# Patient Record
Sex: Female | Born: 1935
Health system: Southern US, Community
[De-identification: ages and names within clinical notes are randomized; demographics above are authoritative.]

## PROBLEM LIST (undated history)

## (undated) DIAGNOSIS — M1990 Unspecified osteoarthritis, unspecified site: Secondary | ICD-10-CM

## (undated) DIAGNOSIS — I2119 ST elevation (STEMI) myocardial infarction involving other coronary artery of inferior wall: Secondary | ICD-10-CM

## (undated) DIAGNOSIS — I2510 Atherosclerotic heart disease of native coronary artery without angina pectoris: Secondary | ICD-10-CM

## (undated) DIAGNOSIS — I428 Other cardiomyopathies: Secondary | ICD-10-CM

## (undated) DIAGNOSIS — E1122 Type 2 diabetes mellitus with diabetic chronic kidney disease: Secondary | ICD-10-CM

## (undated) DIAGNOSIS — E559 Vitamin D deficiency, unspecified: Secondary | ICD-10-CM

## (undated) DIAGNOSIS — I340 Nonrheumatic mitral (valve) insufficiency: Secondary | ICD-10-CM

## (undated) DIAGNOSIS — Z7901 Long term (current) use of anticoagulants: Secondary | ICD-10-CM

## (undated) DIAGNOSIS — E782 Mixed hyperlipidemia: Secondary | ICD-10-CM

## (undated) DIAGNOSIS — I872 Venous insufficiency (chronic) (peripheral): Secondary | ICD-10-CM

## (undated) DIAGNOSIS — I10 Essential (primary) hypertension: Secondary | ICD-10-CM

## (undated) DIAGNOSIS — Z972 Presence of dental prosthetic device (complete) (partial): Secondary | ICD-10-CM

## (undated) DIAGNOSIS — I482 Chronic atrial fibrillation, unspecified: Secondary | ICD-10-CM

## (undated) DIAGNOSIS — E669 Obesity, unspecified: Secondary | ICD-10-CM

## (undated) DIAGNOSIS — I5042 Chronic combined systolic (congestive) and diastolic (congestive) heart failure: Secondary | ICD-10-CM

## (undated) DIAGNOSIS — M48061 Spinal stenosis, lumbar region without neurogenic claudication: Secondary | ICD-10-CM

## (undated) DIAGNOSIS — E119 Type 2 diabetes mellitus without complications: Secondary | ICD-10-CM

## (undated) DIAGNOSIS — Z9289 Personal history of other medical treatment: Secondary | ICD-10-CM

## (undated) DIAGNOSIS — G8929 Other chronic pain: Secondary | ICD-10-CM

## (undated) DIAGNOSIS — E079 Disorder of thyroid, unspecified: Secondary | ICD-10-CM

## (undated) DIAGNOSIS — Z794 Long term (current) use of insulin: Secondary | ICD-10-CM

## (undated) HISTORY — DX: Venous insufficiency (chronic) (peripheral): I87.2

## (undated) HISTORY — DX: Obesity, unspecified: E66.9

## (undated) HISTORY — DX: Disorder of thyroid, unspecified: E07.9

## (undated) HISTORY — DX: Essential (primary) hypertension: I10

## (undated) HISTORY — DX: Atherosclerotic heart disease of native coronary artery without angina pectoris: I25.10

## (undated) HISTORY — DX: Vitamin D deficiency, unspecified: E55.9

## (undated) HISTORY — DX: Chronic combined systolic (congestive) and diastolic (congestive) heart failure: I50.42

## (undated) HISTORY — DX: ST elevation (STEMI) myocardial infarction involving other coronary artery of inferior wall: I21.19

## (undated) HISTORY — DX: Chronic atrial fibrillation, unspecified: I48.20

## (undated) HISTORY — DX: Other cardiomyopathies: I42.8

## (undated) HISTORY — DX: Long term (current) use of anticoagulants: Z79.01

## (undated) HISTORY — DX: Unspecified osteoarthritis, unspecified site: M19.90

## (undated) HISTORY — DX: Type 2 diabetes mellitus without complications: E11.9

## (undated) HISTORY — DX: Mixed hyperlipidemia: E78.2

---

## 1966-01-30 HISTORY — PX: TUBAL LIGATION: SHX77

## 1994-01-30 HISTORY — PX: VESICOVAGINAL FISTULA CLOSURE W/ TAH: SUR271

## 1997-01-30 HISTORY — PX: CHOLECYSTECTOMY: SHX55

## 2005-05-04 ENCOUNTER — Ambulatory Visit: Payer: Self-pay | Admitting: Family Medicine

## 2005-05-11 ENCOUNTER — Ambulatory Visit: Payer: Self-pay | Admitting: Family Medicine

## 2005-11-06 ENCOUNTER — Emergency Department: Payer: Self-pay | Admitting: Emergency Medicine

## 2005-11-06 ENCOUNTER — Other Ambulatory Visit: Payer: Self-pay

## 2005-12-14 ENCOUNTER — Ambulatory Visit: Payer: Self-pay | Admitting: Cardiology

## 2006-01-10 ENCOUNTER — Ambulatory Visit: Payer: Self-pay | Admitting: Cardiology

## 2006-02-02 ENCOUNTER — Ambulatory Visit: Payer: Self-pay | Admitting: Cardiology

## 2006-05-02 ENCOUNTER — Ambulatory Visit: Payer: Self-pay | Admitting: Cardiology

## 2006-05-22 ENCOUNTER — Ambulatory Visit: Payer: Self-pay | Admitting: Family Medicine

## 2006-11-08 ENCOUNTER — Ambulatory Visit: Payer: Self-pay | Admitting: Cardiology

## 2006-11-22 ENCOUNTER — Ambulatory Visit: Payer: Self-pay | Admitting: Cardiology

## 2006-11-22 LAB — CONVERTED CEMR LAB
BUN: 17 mg/dL (ref 6–23)
Calcium: 9.4 mg/dL (ref 8.4–10.5)
Creatinine, Ser: 0.78 mg/dL (ref 0.40–1.20)
Glucose, Bld: 104 mg/dL — ABNORMAL HIGH (ref 70–99)

## 2006-11-27 ENCOUNTER — Encounter: Payer: Self-pay | Admitting: Cardiology

## 2006-11-27 ENCOUNTER — Ambulatory Visit: Payer: Self-pay | Admitting: Cardiology

## 2006-12-07 ENCOUNTER — Ambulatory Visit: Payer: Self-pay | Admitting: Cardiology

## 2007-05-17 ENCOUNTER — Ambulatory Visit: Payer: Self-pay | Admitting: Cardiology

## 2007-11-01 ENCOUNTER — Ambulatory Visit: Payer: Self-pay | Admitting: Cardiovascular Disease

## 2007-11-08 ENCOUNTER — Ambulatory Visit: Payer: Self-pay | Admitting: Cardiovascular Disease

## 2007-11-08 ENCOUNTER — Ambulatory Visit: Payer: Self-pay

## 2007-11-26 ENCOUNTER — Ambulatory Visit: Payer: Self-pay | Admitting: Cardiovascular Disease

## 2007-11-26 LAB — CONVERTED CEMR LAB
T4, Total: 8.2 ug/dL (ref 5.0–12.5)
TSH: 4.624 microintl units/mL — ABNORMAL HIGH (ref 0.350–4.50)

## 2008-01-28 ENCOUNTER — Ambulatory Visit: Payer: Self-pay | Admitting: Cardiovascular Disease

## 2008-03-05 ENCOUNTER — Ambulatory Visit: Payer: Self-pay | Admitting: Rheumatology

## 2008-03-24 ENCOUNTER — Ambulatory Visit: Payer: Self-pay | Admitting: Family Medicine

## 2008-06-25 ENCOUNTER — Encounter: Payer: Self-pay | Admitting: Cardiovascular Disease

## 2008-06-25 ENCOUNTER — Encounter (INDEPENDENT_AMBULATORY_CARE_PROVIDER_SITE_OTHER): Payer: Self-pay | Admitting: *Deleted

## 2008-06-25 LAB — CONVERTED CEMR LAB
INR: 2.5
Prothrombin Time: 24.6 s

## 2008-07-16 ENCOUNTER — Encounter (INDEPENDENT_AMBULATORY_CARE_PROVIDER_SITE_OTHER): Payer: Self-pay | Admitting: *Deleted

## 2008-07-16 ENCOUNTER — Encounter: Payer: Self-pay | Admitting: Cardiovascular Disease

## 2008-07-16 LAB — CONVERTED CEMR LAB
INR: 2.7
Prothrombin Time: 26.7 s

## 2008-07-20 ENCOUNTER — Telehealth: Payer: Self-pay | Admitting: Cardiovascular Disease

## 2008-09-08 ENCOUNTER — Encounter: Payer: Self-pay | Admitting: Cardiovascular Disease

## 2008-09-08 ENCOUNTER — Encounter (INDEPENDENT_AMBULATORY_CARE_PROVIDER_SITE_OTHER): Payer: Self-pay | Admitting: *Deleted

## 2008-09-10 ENCOUNTER — Encounter (INDEPENDENT_AMBULATORY_CARE_PROVIDER_SITE_OTHER): Payer: Self-pay | Admitting: *Deleted

## 2008-09-16 ENCOUNTER — Telehealth: Payer: Self-pay | Admitting: Cardiovascular Disease

## 2008-09-16 ENCOUNTER — Ambulatory Visit: Payer: Self-pay | Admitting: Cardiovascular Disease

## 2008-09-16 DIAGNOSIS — I2510 Atherosclerotic heart disease of native coronary artery without angina pectoris: Secondary | ICD-10-CM

## 2008-09-16 DIAGNOSIS — E785 Hyperlipidemia, unspecified: Secondary | ICD-10-CM | POA: Insufficient documentation

## 2008-09-16 DIAGNOSIS — I4891 Unspecified atrial fibrillation: Secondary | ICD-10-CM | POA: Insufficient documentation

## 2009-01-04 ENCOUNTER — Telehealth: Payer: Self-pay | Admitting: Cardiology

## 2009-01-27 ENCOUNTER — Telehealth: Payer: Self-pay | Admitting: Cardiovascular Disease

## 2009-02-08 ENCOUNTER — Ambulatory Visit: Payer: Self-pay | Admitting: Cardiovascular Disease

## 2009-02-08 DIAGNOSIS — I5032 Chronic diastolic (congestive) heart failure: Secondary | ICD-10-CM | POA: Insufficient documentation

## 2009-02-11 ENCOUNTER — Ambulatory Visit: Payer: Self-pay

## 2009-02-11 ENCOUNTER — Encounter: Payer: Self-pay | Admitting: Cardiovascular Disease

## 2009-03-16 ENCOUNTER — Encounter: Payer: Self-pay | Admitting: Cardiovascular Disease

## 2009-08-03 ENCOUNTER — Telehealth: Payer: Self-pay | Admitting: Cardiovascular Disease

## 2009-08-05 ENCOUNTER — Ambulatory Visit: Payer: Self-pay | Admitting: Cardiovascular Disease

## 2009-10-01 ENCOUNTER — Telehealth: Payer: Self-pay | Admitting: Cardiovascular Disease

## 2009-11-23 ENCOUNTER — Encounter: Payer: Self-pay | Admitting: Cardiovascular Disease

## 2009-12-21 ENCOUNTER — Ambulatory Visit: Payer: Self-pay | Admitting: Family Medicine

## 2009-12-21 ENCOUNTER — Encounter: Payer: Self-pay | Admitting: Cardiovascular Disease

## 2009-12-21 ENCOUNTER — Encounter: Payer: Self-pay | Admitting: Internal Medicine

## 2009-12-31 ENCOUNTER — Ambulatory Visit: Payer: Self-pay | Admitting: Internal Medicine

## 2010-01-11 ENCOUNTER — Encounter: Payer: Self-pay | Admitting: Internal Medicine

## 2010-01-11 ENCOUNTER — Ambulatory Visit: Payer: Self-pay

## 2010-02-01 ENCOUNTER — Ambulatory Visit
Admission: RE | Admit: 2010-02-01 | Discharge: 2010-02-01 | Payer: Self-pay | Source: Home / Self Care | Attending: Cardiovascular Disease | Admitting: Cardiovascular Disease

## 2010-02-01 ENCOUNTER — Encounter: Payer: Self-pay | Admitting: Cardiovascular Disease

## 2010-02-01 DIAGNOSIS — R0602 Shortness of breath: Secondary | ICD-10-CM

## 2010-03-02 NOTE — Assessment & Plan Note (Signed)
Summary: F6M/AMD  Medications Added COUMADIN 3 MG TABS (WARFARIN SODIUM) take as directed MACROBID 100 MG CAPS (NITROFURANTOIN MONOHYD MACRO) 1 once daily      Allergies Added:   Visit Type:  Follow-up Primary Provider:  Julieanne Manson MD  CC:  c/o swelling in legs and ankles, shortness of breath and dizziness. She had a spell of chest pressure this past Tuesday with jaw pain, and took a NTG tablet and pain was better after two NTG tablet.Marland Kitchen  History of Present Illness: Tracy Mann is a pleasant 77 year old Caucasian female with a past medical history significant for chronic atrial fibrillation on Coumadin therapy, diastolic dysfunction, hypertension, hyperlipidemia, obesity, and history of an acute inferior wall myocardial infarction that was felt to be secondary to a thrombus from atrial fib.  in October 2007 at [facility], who is here today for routine cardiac follow up.  She has been rate controlled with beta blocker therapy only and has not wanted to consider cardioversion or anti-arrythmic therapy.   stress perfusion study in 2009 which showed a fixed mid apical inferior defect and a small area of anterior wall reversibility that was felt to be likely secondary to breast attenuation artifact.   48-hour Holter monitor, which showed that she was in persistent atrial fibrillation with one episode of rapid ventricular response.   On previous visits, she has complained of edema, flushing in her face, neck as well as fatigue. Today she reports that she had a severe episode of right face and neck pain that came on acutely while she was driving 2 days ago. She had to pull over, she was very anxious and felt that she did not have any help in case she passed out. She took a Nitroglycerin and her pain resolved. She has been very anxious about her symptoms of right face and neck pain given that her old heart attack presented with pain on the left side of her face. In the past 2  days, she had no further episodes, as been ambulating without difficulty though she reports having other symptoms such as arm numbness, feeling weak.     Current Medications (verified): 1)  Zetia 10 Mg Tabs (Ezetimibe) .... Take One Tablet By Mouth Daily. 2)  Metoprolol Succinate 25 Mg Xr24h-Tab (Metoprolol Succinate) .... Take One Tablet By Mouth Two Times A Day 3)  Lisinopril 2.5 Mg Tabs (Lisinopril) .... Take One Tablet By Mouth Daily 4)  Coumadin 3 Mg Tabs (Warfarin Sodium) .... Take As Directed 5)  Levothroid 75 Mcg Tabs (Levothyroxine Sodium) .Marland Kitchen.. 1 By Mouth Once Daily 6)  Zolpidem Tartrate 10 Mg Tabs (Zolpidem Tartrate) .Marland Kitchen.. 1 By Mouth Once Daily 7)  Hydrocodone-Acetaminophen 10-325 Mg Tabs (Hydrocodone-Acetaminophen) .... As Needed 8)  Lorazepam 1 Mg Tabs (Lorazepam) .... Two Times A Day As Needed 9)  Potassium Chloride Cr 10 Meq Cr-Caps (Potassium Chloride) .... 1/2  Tablet By Mouth Daily 10)  Chromium Picolinate 500 Mcg Tabs (Chromium Picolinate) .Marland Kitchen.. 1 -2 Daily 11)  Magnesium 500 Mg Tabs (Magnesium) .Marland Kitchen.. 1 By Mouth Once Daily 12)  Nitrostat 0.4 Mg Subl (Nitroglycerin) .Marland Kitchen.. 1 Tablet Under Tongue At Onset of Chest Pain; You May Repeat Every 5 Minutes For Up To 3 Doses. 13)  Furosemide 20 Mg Tabs (Furosemide) .... Take One Tablet By Mouth Daily As Directed 14)  Macrobid 100 Mg Caps (Nitrofurantoin Monohyd Macro) .Marland Kitchen.. 1 Once Daily  Allergies (verified): 1)  ! Sulfa  Past History:  Past Medical History: Last updated: 09/12/2008  1. Chronic atrial fibrillation.   2. Chronic diastolic dysfunction.   3. Anticoagulation.   4. History of an acute inferior wall infarct secondary to a coronary       embolus probably secondary to her atrial fibrillation.  She had       nonobstructive disease otherwise.   5. Hypertension.   6. Obesity.   7. Mixed hyperlipidemia.   Past Surgical History: Last updated: 05-Oct-2008  Hysterectomy in 1996,  tubal ligation in 1968,  cholecystectomy in  1999.  Family History: Last updated: 2008-10-05  Mother died of probable heart problems at age 64  Social History: Last updated: 02/08/2009 She is widowed. She has children and grandchildren.  She lives in [place]. No tobacco alcohol or drugs  Review of Systems       The patient complains of peripheral edema.  The patient denies fever, weight loss, weight gain, vision loss, decreased hearing, hoarseness, chest pain, syncope, dyspnea on exertion, prolonged cough, abdominal pain, incontinence, muscle weakness, depression, and enlarged lymph nodes.         Right neck and face/head pain  Vital Signs:  Patient profile:   77 year old female Height:      65.5 inches Weight:      232 pounds BMI:     38.16 Pulse rate:   94 / minute BP sitting:   156 / 78  (right arm) Cuff size:   large  Vitals Entered By: Bishop Dublin, CMA (August 05, 2009 3:57 PM)  Physical Exam  General:  Well developed, well nourished, in no acute distress.Obese Head:  normocephalic and atraumatic Neck:  Neck supple, no JVD. No masses, thyromegaly or abnormal cervical nodes. Chest Wall:  no deformities or breast masses noted Lungs:  Clear bilaterally to auscultation and percussion. Heart:  Non-displaced PMI, chest non-tender; regular rate and rhythm, S1, S2 without murmurs, rubs or gallops. Carotid upstroke normal, no bruit. . Pedals normal pulses. trace edema around her ankles, no varicosities. Abdomen:  Bowel sounds positive; abdomen soft and non-tender without masses Msk:  Back normal, normal gait. Muscle strength and tone normal. Pulses:  pulses normal in all 4 extremities Extremities:  No clubbing or cyanosis. Neurologic:  Alert and oriented x 3. Skin:  Intact without lesions or rashes. Psych:  Normal affect.   Impression & Recommendations:  Problem # 1:  ATRIAL FIBRILLATION (ICD-427.31) heart rate appears to be relatively well controlled on her current dose of metoprolol. She has no symptoms of  tachycardia palpitations, shortness of breath. We have made no changes.  Her updated medication list for this problem includes:    Metoprolol Succinate 25 Mg Xr24h-tab (Metoprolol succinate) .Marland Kitchen... Take one tablet by mouth two times a day    Coumadin 3 Mg Tabs (Warfarin sodium) .Marland Kitchen... Take as directed  Orders: EKG w/ Interpretation (93000)  Problem # 2:  HYPERTENSION, HEART CONTROLLED W/O ASSOC CHF (ICD-402.10) Her blood pressure is borderline elevated today though she states that it is typically well controlled at home. We have asked her to keep an eye on her pressure and to let us know if she continues to have systolics greater than 140.  Her updated medication list for this problem includes:    Metoprolol Succinate 25 Mg Xr24h-tab (Metoprolol succinate) .Marland Kitchen... Take one tablet by mouth two times a day    Lisinopril 2.5 Mg Tabs (Lisinopril) .Marland Kitchen... Take one tablet by mouth daily    Furosemide 20 Mg Tabs (Furosemide) .Marland Kitchen... Take one tablet by mouth daily as  directed  Problem # 3:  HYPERLIPIDEMIA-MIXED (ICD-272.4) She has been unable to tolerate statins. She has no severe coronary artery disease or peripheral vascular disease. She has been able to tolerate zetia.   Her updated medication list for this problem includes:    Zetia 10 Mg Tabs (Ezetimibe) .Marland Kitchen... Take one tablet by mouth daily.  Problem # 4:  CAD, NATIVE VESSEL (ICD-414.01) History of suspected thrombus secondary to atrial fibrillation with catheterization at [facility].  She had a recent episode of right neck and face/head pain that seemed to be resolved with nitroglycerin. It is very atypical for cardiac. I would be more concerned about cervical disc disease, esophageal spasm, neck musculoskeletal/ligamental spasm. I've asked her to contact myself or Dr. Sullivan Lone if she has recurrent episodes.  Her updated medication list for this problem includes:    Metoprolol Succinate 25 Mg Xr24h-tab (Metoprolol succinate) .Marland Kitchen... Take one tablet by  mouth two times a day    Lisinopril 2.5 Mg Tabs (Lisinopril) .Marland Kitchen... Take one tablet by mouth daily    Coumadin 3 Mg Tabs (Warfarin sodium) .Marland Kitchen... Take as directed    Nitrostat 0.4 Mg Subl (Nitroglycerin) .Marland Kitchen... 1 tablet under tongue at onset of chest pain; you may repeat every 5 minutes for up to 3 doses.  Orders: EKG w/ Interpretation (93000)

## 2010-03-02 NOTE — Assessment & Plan Note (Signed)
Summary: rov  Medications Added POTASSIUM CHLORIDE CR 10 MEQ CR-CAPS (POTASSIUM CHLORIDE) 1/2  tablet by mouth daily CHROMIUM PICOLINATE 500 MCG TABS (CHROMIUM PICOLINATE) 1 -2 daily MAGNESIUM 500 MG TABS (MAGNESIUM) 1 by mouth once daily NITROSTAT 0.4 MG SUBL (NITROGLYCERIN) 1 tablet under tongue at onset of chest pain; you may repeat every 5 minutes for up to 3 doses.      Allergies Added:   Visit Type:  Follow-up Primary Provider:  Julieanne Manson MD  CC:  little swelling in legs, some sob, and chest pain when retaining fluid.  History of Present Illness: [name] is a pleasant 77 year old Caucasian female with a past medical history significant for chronic persistent atrial fibrillation on Coumadin therapy, chronic diastolic dysfunction, hypertension, hyperlipidemia, obesity, and history of an acute inferior wall myocardial infarction that was felt to be secondary to a thrombus in October 2007 at [facility], who is here today for routine cardiac follow up.  The patient was  evaluated with a stress perfusion study in 2009 which showed a fixed mid apical inferior defect and a small area of anterior wall reversibility that was felt to be likely secondary to breast attenuation artifact.  I also had her wear a  48-hour Holter monitor, which showed that she was in persistent atrial fibrillation with one episode of rapid ventricular response.  Ms. Talamante main complaints in the past have been that of chest pain as well as some lower extremity edema, flushing in her face and neck and an overall feeling of fatigue. She has been rate controlled with beta blocker therapy only and has not wanted to consider cardioversion or anti-arrythmic therapy.    She returns today for routine follow up. She has been doing well. Her overall energy level is decreased but stable.  She did not watch her diet around the holidays and reports fluid build up. She felt shortness of breath and had some  mild chest pains. She took Lasix for two days and has felt better over the last week. She has had no exertional chest pain. She denies any dizziness, near syncope or syncope. She does not take an ASA because of GI irritation. Her coumadin is followed in Dr. Wonda Olds office and her INR has been therapeutic per the patient.   Current Medications (verified): 1)  Zetia 10 Mg Tabs (Ezetimibe) .... Take One Tablet By Mouth Daily. 2)  Metoprolol Succinate 25 Mg Xr24h-Tab (Metoprolol Succinate) .... Take One Tablet By Mouth Two Times A Day 3)  Lisinopril 2.5 Mg Tabs (Lisinopril) .... Take One Tablet By Mouth Daily 4)  Warfarin Sodium 4 Mg Tabs (Warfarin Sodium) .... Use As Directed By Anticoagulation Clinic 5)  Levothroid 75 Mcg Tabs (Levothyroxine Sodium) .Marland Kitchen.. 1 By Mouth Once Daily 6)  Zolpidem Tartrate 10 Mg Tabs (Zolpidem Tartrate) .Marland Kitchen.. 1 By Mouth Once Daily 7)  Hydrocodone-Acetaminophen 10-325 Mg Tabs (Hydrocodone-Acetaminophen) .... As Needed 8)  Lorazepam 1 Mg Tabs (Lorazepam) .... Two Times A Day As Needed 9)  Daily Multi 50+  Tabs (Multiple Vitamins-Minerals) .Marland Kitchen.. 1 By Mouth Once Daily 10)  Pantoprazole Sodium 40 Mg Tbec (Pantoprazole Sodium) .Marland Kitchen.. 1 By Mouth Once Daily 11)  Potassium Chloride Cr 10 Meq Cr-Caps (Potassium Chloride) .... 1/2  Tablet By Mouth Daily 12)  Chromium Picolinate 500 Mcg Tabs (Chromium Picolinate) .Marland Kitchen.. 1 -2 Daily 13)  Magnesium 500 Mg Tabs (Magnesium) .Marland Kitchen.. 1 By Mouth Once Daily  Allergies (verified): 1)  ! Sulfa  Past History:  Past Medical History:  Reviewed history from 09/12/2008 and no changes required.  1. Chronic atrial fibrillation.   2. Chronic diastolic dysfunction.   3. Anticoagulation.   4. History of an acute inferior wall infarct secondary to a coronary       embolus probably secondary to her atrial fibrillation.  She had       nonobstructive disease otherwise.   5. Hypertension.   6. Obesity.   7. Mixed hyperlipidemia.   Social History: Reviewed  history from 09/12/2008 and no changes required. She is widowed. She has children and grandchildren.  She lives in [place]. No tobacco alcohol or drugs  Review of Systems       The patient complains of fatigue, chest pain, shortness of breath, and leg swelling.  The patient denies malaise, fever, weight gain/loss, vision loss, decreased hearing, hoarseness, palpitations, prolonged cough, wheezing, sleep apnea, coughing up blood, abdominal pain, blood in stool, nausea, vomiting, diarrhea, heartburn, incontinence, blood in urine, muscle weakness, joint pain, rash, skin lesions, headache, fainting, dizziness, depression, anxiety, enlarged lymph nodes, easy bruising or bleeding, and environmental allergies.    Vital Signs:  Patient profile:   77 year old female Height:      65.5 inches Weight:      223 pounds BMI:     36.68 Pulse rate:   74 / minute Pulse rhythm:   regular BP sitting:   140 / 64  (left arm) Cuff size:   large  Vitals Entered By: Mercer Pod (February 08, 2009 3:06 PM)  Physical Exam  General:  General: Well developed, well nourished, NAD Psychiatric: Mood and affect normal Neck: No JVD, no carotid bruits, no thyromegaly, no lymphadenopathy. Lungs:Clear bilaterally, no wheezes, rhonci, crackles UE:[id number]. No murmurs, gallops rubs Abdomen: soft, NT, ND, BS present Extremities: No edema, pulses 2+.    Impression & Recommendations:  Problem # 1:  ATRIAL FIBRILLATION (ICD-427.31) Chronic persistent atrial fibrillation on coumadin therapy. She has been rate controlled. She has no palpitations. She does not wish to have a cardioversion since she is doing well with rate control. Continue anticoagulation with coumadin. Continue current dose of beta blocker.   Her updated medication list for this problem includes:    Metoprolol Succinate 25 Mg Xr24h-tab (Metoprolol succinate) .Marland Kitchen... Take one tablet by mouth two times a day    Warfarin Sodium 4 Mg Tabs (Warfarin  sodium) ..... Use as directed by anticoagulation clinic  Problem # 2:  CAD, NATIVE VESSEL (ICD-414.01) Stable.   Her updated medication list for this problem includes:    Metoprolol Succinate 25 Mg Xr24h-tab (Metoprolol succinate) .Marland Kitchen... Take one tablet by mouth two times a day    Lisinopril 2.5 Mg Tabs (Lisinopril) .Marland Kitchen... Take one tablet by mouth daily    Warfarin Sodium 4 Mg Tabs (Warfarin sodium) ..... Use as directed by anticoagulation clinic    Nitrostat 0.4 Mg Subl (Nitroglycerin) .Marland Kitchen... 1 tablet under tongue at onset of chest pain; you may repeat every 5 minutes for up to 3 doses.  Orders: Echocardiogram (Echo)  Problem # 3:  DIASTOLIC HEART FAILURE, CHRONIC (ICD-428.32) Volume status ok. She will follow her weights and alert [place] if it increases by 2 pounds in 2 consecutive days. She has as needed Lasix. Will check echo to assess LV systolic function.   Her updated medication list for this problem includes:    Metoprolol Succinate 25 Mg Xr24h-tab (Metoprolol succinate) .Marland Kitchen... Take one tablet by mouth two times a day    Lisinopril 2.5 Mg Tabs (Lisinopril) .Marland KitchenMarland KitchenMarland KitchenMarland Kitchen  Take one tablet by mouth daily    Warfarin Sodium 4 Mg Tabs (Warfarin sodium) ..... Use as directed by anticoagulation clinic    Nitrostat 0.4 Mg Subl (Nitroglycerin) .Marland Kitchen... 1 tablet under tongue at onset of chest pain; you may repeat every 5 minutes for up to 3 doses.  Patient Instructions: 1)  Your physician recommends that you schedule a follow-up appointment in: 6 months 2)  Your physician recommends that you continue on your current medications as directed. Please refer to the Current Medication list given to you today. 3)  Your physician has requested that you have an echocardiogram.  Echocardiography is a painless test that uses sound waves to create images of your heart. It provides your doctor with information about the size and shape of your heart and how well your heart's chambers and valves are working.  This procedure  takes approximately one hour. There are no restrictions for this procedure. Prescriptions: NITROSTAT 0.4 MG SUBL (NITROGLYCERIN) 1 tablet under tongue at onset of chest pain; you may repeat every 5 minutes for up to 3 doses.  #20 x 6   Entered by:   Charlena Cross, RN, BSN   Authorized by:   Verne Carrow, MD   Signed by:   Charlena Cross, RN, BSN on 02/08/2009   Method used:   Print then Give to Patient   RxID:   [id number]

## 2010-03-02 NOTE — Progress Notes (Signed)
Summary: METOPROLOL   Phone Note Call from Patient Call back at Home Phone [phone]   Caller: SELF Call For: [facility] Summary of Call: PT WOULD LIKE TO CUT BACK ON METOPROLOL OR DISCONTINUE IT-PT STATES THAT SHE HAS MORE ENERGY WHEN NOT TAKING IT Initial call taken by: Harlon Flor,  October 01, 2009 10:39 AM  Follow-up for Phone Call        Pt reports fatigue and has tried for the last 2 days not taking her metoprolol in the AM she reports that she has more energy. My only concern is that her last OV her BP in the office was in the 150's. Asked her to try taking 1/2 tab in the AM and her full tab in the PM. Monitor BP's and call me in 1 week with her readings.  Follow-up by: Benedict Needy, RN,  October 01, 2009 11:24 AM

## 2010-03-02 NOTE — Medication Information (Signed)
Summary: [facility]madin Clinic  Coumadin Clinic   Imported By: [place] 08/05/2009 11:55:10  _____________________________________________________________________  External Attachment:    Type:   Image     Comment:   External Document  Appended Document: Coumadin Clinic Preliminarily reviewed. Forwarded to MD desktop for review and signature

## 2010-03-02 NOTE — Assessment & Plan Note (Signed)
Summary: A-FIB/shortness of breath  Medications Added PHENAZOPYRIDINE HCL 100 MG TABS (PHENAZOPYRIDINE HCL) as needed      Allergies Added:   Visit Type:  Follow-up Primary Jaysen Wey:  Julieanne Manson MD  CC:  c/o SOB and dizziness.Marland Kitchen  History of Present Illness: Ms. Punch is a 77 year old woman with a past medical history significant for chronic AF on Coumadin therapy, diastolic dysfunction, hypertension, hyperlipidemia, obesity, and history of an acute inferior wall myocardial infarction that was felt to be secondary to a thrombus from atrial fib. in October 2007 at [facility]  She had a stress perfusion study in 2009 which showed a fixed mid apical inferior defect and a small area of anterior wall reversibility that was felt to be likely secondary to breast attenuation artifact.   She has been rate controlled with beta blocker therapy only and has not wanted to consider cardioversion or anti-arrythmic therapy.  48-hour Holter monitor, which showed that she was in persistent atrial fibrillation with one episode of rapid ventricular response.   She has seen Dr. Daleen Squibb and Dr. Mariah Milling in past. Was seen by Dr. Sullivan Lone a week ago and was complaining of dizziness and severe weakness and fatigue. Was added onto my schedule last week but didn't show so rescheduled for this week.  Numerous complaints today. Says she is very fatigued. If she walks through the grocery store she is wiped out and has to lie down. Also notes that over last 6 weeks she has a lot of chest pressure mostly with exertion. Has also continued to gain weight. Has occasional swelling and takes lasix as needed.   Doesn't know if she snores. Sleeps on 3 pillows. Also complains of severe arthritis and says it is hard for her to get around and she can't lift her right shoulder very far.       Current Medications (verified): 1)  Metoprolol Succinate 25 Mg Xr24h-Tab (Metoprolol Succinate) .... Take One Tablet By  Mouth Two Times A Day 2)  Lisinopril 2.5 Mg Tabs (Lisinopril) .... Take One Tablet By Mouth Daily 3)  Coumadin 3 Mg Tabs (Warfarin Sodium) .... Take As Directed 4)  Levothroid 75 Mcg Tabs (Levothyroxine Sodium) .Marland Kitchen.. 1 By Mouth Once Daily 5)  Zolpidem Tartrate 10 Mg Tabs (Zolpidem Tartrate) .Marland Kitchen.. 1 By Mouth Once Daily 6)  Hydrocodone-Acetaminophen 10-325 Mg Tabs (Hydrocodone-Acetaminophen) .... As Needed 7)  Lorazepam 1 Mg Tabs (Lorazepam) .... Two Times A Day As Needed 8)  Potassium Chloride Cr 10 Meq Cr-Caps (Potassium Chloride) .... 1/2  Tablet By Mouth Daily 9)  Chromium Picolinate 500 Mcg Tabs (Chromium Picolinate) .Marland Kitchen.. 1 -2 Daily 10)  Magnesium 500 Mg Tabs (Magnesium) .Marland Kitchen.. 1 By Mouth Once Daily 11)  Nitrostat 0.4 Mg Subl (Nitroglycerin) .Marland Kitchen.. 1 Tablet Under Tongue At Onset of Chest Pain; You May Repeat Every 5 Minutes For Up To 3 Doses. 12)  Furosemide 20 Mg Tabs (Furosemide) .... Take One Tablet By Mouth Daily As Directed 13)  Macrobid 100 Mg Caps (Nitrofurantoin Monohyd Macro) .Marland Kitchen.. 1 Once Daily 14)  Phenazopyridine Hcl 100 Mg Tabs (Phenazopyridine Hcl) .... As Needed  Allergies (verified): 1)  ! Sulfa  Past History:  Past Medical History: Last updated: 09/12/2008  1. Chronic atrial fibrillation.   2. Chronic diastolic dysfunction.   3. Anticoagulation.   4. History of an acute inferior wall infarct secondary to a coronary       embolus probably secondary to her atrial fibrillation.  She had  nonobstructive disease otherwise.   5. Hypertension.   6. Obesity.   7. Mixed hyperlipidemia.   Past Surgical History: Last updated: Sep 29, 2008  Hysterectomy in 1996,  tubal ligation in 1968,  cholecystectomy in 1999.  Family History: Last updated: 09-29-08  Mother died of probable heart problems at age 26  Social History: Last updated: 02/08/2009 She is widowed. She has children and grandchildren.  She lives in [place]. No tobacco alcohol or drugs  Review of  Systems       As per HPI and past medical history; otherwise all systems negative.   Vital Signs:  Patient profile:   77 year old female Height:      65.5 inches Weight:      234 pounds Pulse rate:   82 / minute BP sitting:   138 / 88  (left arm) Cuff size:   regular  Vitals Entered By: Lysbeth Galas CMA (December 31, 2009 3:44 PM)   Impression & Recommendations:  Problem # 1:  DYSPNEA I suspect this is multifactorial but due primarily to her obesity and deconditioning however I also suspect OSA and possibly diastolic HF may be playing a role. We discussed the need to try and lose weight and also be more active. We will check an echo and also repeat her Myoview scan to evaluate for cardiac contribution to her symptoms. Will arrange sleep study to assess for OSA.   Other Orders: Echocardiogram (Echo)  Patient Instructions: 1)  Your physician recommends that you schedule a follow-up appointment in: 1 month with Dr. Mariah Milling 2)  Your physician recommends that you continue on your current medications as directed. Please refer to the Current Medication list given to you today. 3)  You have been referred to [facility] 01/14/10 11:15am with Dr. Craige Cotta 4)  Your physician has requested that you have a LexiScan myoview.  For further information please visit [email or web address]  Please follow instruction sheet, as given. 5)  Your physician has requested that you have an echocardiogram.  Echocardiography is a painless test that uses sound waves to create images of your heart. It provides your doctor with information about the size and shape of your heart and how well your heart's chambers and valves are working.  This procedure takes approximately one hour. There are no restrictions for this procedure.

## 2010-03-02 NOTE — Letter (Signed)
Summary: Radio broadcast assistant at [address]   [place], [place] [address]   Phone: [phone]  Fax: [phone]      [facility] MYOVIEW  Patient Name:  Tracy Mann          MRN:  [id number]  DOB:  Dec 26, 1933             Weight:  234 Home Telephone:  [phone]           Work Phone:   Referring Physician:   _____ Gated Myoview  ___X__ Lexi Scan   Instructions regarding medication: __Hold Metoprolol and Furosemide am of procedure____________________________    PLEASE NOTIFY THE OFFICE AT LEAST 24 HOURS IN ADVANCE IF YOU ARE UNABLE TO KEEP YOUR APPOINTMENT.  [phone]   How to prepare for your Myoview test:  1)   Do not eat or drink 4 hours prior to arrival time. 2)   No caffeine or decaffeinated 12 hours prior to arrival. 3)   Your medication may be taken with water.  If your doctor stopped a        medicaiton because of this test, do not take that medication. 4)   Ladies, please do not wear dresses.  Skirts or pants are appropriate.       Please wear a short sleeve shirt. 5)   No perfume, cologne or lotion. 6)   Wear comfortable walking shoes.  No heels!    PLEASE REPORT TO [facility] MEDICAL MALL ENTRANCE. THE VOLUNTEERS AT THE FIRST DESK WILL DIRECT YOU WHERE TO GO.  Appended Document: [facility] Myoview Scheduled pt for myoview at [facility] on 01/05/10. Called pt with appt, pt states she does not wish to have stress test or sleep consult done at this time. Pt states she would like to try to lose weight first. Will cancel myoview at [facility], pt to call [facility] to cancel sleep consult. MES

## 2010-03-02 NOTE — Progress Notes (Signed)
   Phone Note Call from Patient   Caller: Patient Call For: Nurse Summary of Call: Pt called stated that she had a pain the her right jaw and ear, that was so bad that she had to pull her car over on the side of the road.  She took 2 nitro SL and 3 baby aspirin.  She states that her jaw and ear pain has been relieved but she is now having chest pressure.  Asked her to go to ER but pt declined going.  She said "I can't go to Emergency Room every time i have chest pain."  Wanted to be seen by Dr. Mariah Milling today, no appointments available. She had a previously scheduled appointment for July 13 rescheduled that appointment for the first availableThursday. Re-educated pt about nitroglycerin(take 1 tablet SL q 5 minutes x3 if still having chest pain go to ER).  Initial call taken by: Benedict Needy, RN,  August 03, 2009 2:05 PM

## 2010-03-03 NOTE — Assessment & Plan Note (Signed)
Summary: F1M/AMD  Medications Added METOPROLOL SUCCINATE 25 MG XR24H-TAB (METOPROLOL SUCCINATE) Take one tablet by mouth two times a day FUROSEMIDE 20 MG TABS (FUROSEMIDE) Take one tablet by mouth daily      Allergies Added:                         Visit Type:  Follow-up Primary Provider:  Julieanne Manson MD  CC:  F/U one month. c/o shortness of breath with very little activity. Occas. has a fullness in chest..  History of Present Illness: [name] is a 77 year old woman with a PMH significant for chronic AF on Coumadin therapy, diastolic dysfunction, hypertension, hyperlipidemia, obesity, and history of an acute inferior wall myocardial infarction that was felt to be secondary to a thrombus from atrial fib. in October 2007 at [facility], Recent episodes of chest pain who presents for followup.   recent echocardiogram has shown ejection fraction is mildly reduced. Mildly elevated right ventricular systolic pressures.  She has numerous complaints today including continued shortness of breath, fatigue, feeling hot in the morning. She reports a recent episode when she was carrying groceries and her heart rate was elevated. Her shortness of breath was severe and she had profound fatigue. She had to put her groceries down and rest.   She did refuse to followup with the stress test as previously ordered on her last visit to the clinic. She has also not done a sleep study.  She had a stress perfusion study in 2009 which showed a fixed mid apical inferior defect and a small area of anterior wall reversibility that was felt to be likely secondary to breast attenuation artifact.     Previous 48-hour Holter monitor, which showed that she was in persistent atrial fibrillation with one episode of rapid ventricular response.   EKG shows atrial fibrillation with rate of 71 beats per minute, poor R-wave progression through the precordial leads, nonspecific ST/T-wave  changes     Current Medications (verified): 1)  Metoprolol Succinate 25 Mg Xr24h-Tab (Metoprolol Succinate) .... Take One Tablet By Mouth Two Times A Day 2)  Lisinopril 2.5 Mg Tabs (Lisinopril) .... Take One Tablet By Mouth Daily 3)  Coumadin 3 Mg Tabs (Warfarin Sodium) .... Take As Directed 4)  Levothroid 75 Mcg Tabs (Levothyroxine Sodium) .Marland Kitchen.. 1 By Mouth Once Daily 5)  Zolpidem Tartrate 10 Mg Tabs (Zolpidem Tartrate) .Marland Kitchen.. 1 By Mouth Once Daily 6)  Hydrocodone-Acetaminophen 10-325 Mg Tabs (Hydrocodone-Acetaminophen) .... As Needed 7)  Lorazepam 1 Mg Tabs (Lorazepam) .... Two Times A Day As Needed 8)  Potassium Chloride Cr 10 Meq Cr-Caps (Potassium Chloride) .... 1/2  Tablet By Mouth Daily 9)  Chromium Picolinate 500 Mcg Tabs (Chromium Picolinate) .Marland Kitchen.. 1 -2 Daily 10)  Magnesium 500 Mg Tabs (Magnesium) .Marland Kitchen.. 1 By Mouth Once Daily 11)  Nitrostat 0.4 Mg Subl (Nitroglycerin) .Marland Kitchen.. 1 Tablet Under Tongue At Onset of Chest Pain; You May Repeat Every 5 Minutes For Up To 3 Doses. 12)  Furosemide 20 Mg Tabs (Furosemide) .... Take One Tablet By Mouth Daily As Directed 13)  Macrobid 100 Mg Caps (Nitrofurantoin Monohyd Macro) .Marland Kitchen.. 1 Once Daily 14)  Phenazopyridine Hcl 100 Mg Tabs (Phenazopyridine Hcl) .... As Needed  Allergies (verified): 1)  ! Sulfa  Past History:  Past Medical History: Last updated: 09/12/2008  1. Chronic atrial fibrillation.   2. Chronic diastolic dysfunction.   3. Anticoagulation.   4. History of an acute inferior wall infarct secondary  to a coronary       embolus probably secondary to her atrial fibrillation.  She had       nonobstructive disease otherwise.   5. Hypertension.   6. Obesity.   7. Mixed hyperlipidemia.   Past Surgical History: Last updated: 05-Oct-2008  Hysterectomy in 1996,  tubal ligation in 1968,  cholecystectomy in 1999.  Family History: Last updated: 05-Oct-2008  Mother died of probable heart problems at age 77  Social History: Last updated:  02/08/2009 She is widowed. She has children and grandchildren.  She lives in [place]. No tobacco alcohol or drugs  Review of Systems       The patient complains of dyspnea on exertion.  The patient denies fever, weight loss, weight gain, vision loss, decreased hearing, hoarseness, chest pain, syncope, peripheral edema, prolonged cough, abdominal pain, incontinence, muscle weakness, depression, and enlarged lymph nodes.         fatigue  Vital Signs:  Patient profile:   77 year old female Height:      65.5 inches Weight:      233 pounds BMI:     38.32 Pulse rate:   71 / minute BP sitting:   120 / 70  (left arm) Cuff size:   large  Vitals Entered By: Bishop Dublin, CMA (February 01, 2010 11:06 AM)  Physical Exam  General:  Well developed, well nourished, in no acute distress.Obese Head:  normocephalic and atraumatic Neck:  Neck supple, no JVD. No masses, thyromegaly or abnormal cervical nodes. Lungs:  Clear bilaterally to auscultation and percussion. Heart:  Non-displaced PMI, chest non-tender; regular rate and rhythm, S1, S2 without murmurs, rubs or gallops. Carotid upstroke normal, no bruit. Pedals normal pulses. trace edema around her ankles, no varicosities. Abdomen:  Bowel sounds positive; abdomen soft and non-tender without masses Msk:  Back normal, normal gait. Muscle strength and tone normal. Pulses:  pulses normal in all 4 extremities Extremities:  No clubbing or cyanosis. Neurologic:  Alert and oriented x 3. Skin:  Intact without lesions or rashes. Psych:  Normal affect.   Impression & Recommendations:  Problem # 1:  SHORTNESS OF BREATH (ICD-786.05) etiology of shortness of breath is uncertain. She has obesity, is very deconditioned. We are unable to exclude underlying ischemia. Echocardiogram does show mildly depressed ejection fraction with mild pulmonary hypertension. She is reluctant to perform a stress test. She cannot ambulate secondary to her shortness of  breath and deconditioning and would have to perform a lexiscan.  We have suggested that she try more frequent doses of diuretic. She has reported having a bladder problem and this would be a more frequent as is tolerated. We also suggested she take an extra metoprolol p.r.n. for tachycardia with exertion. We have suggested we could change her to metoprolol tartrate as recommended by her insurance company. She would like to use up the remaining Toprol first.  If she does not feel better, she will contact us and we will schedule a lexiscan. Would also schedule her for a sleep study in the future if she is willing.  Her updated medication list for this problem includes:    Metoprolol Succinate 25 Mg Xr24h-tab (Metoprolol succinate) .Marland Kitchen... Take one tablet by mouth two times a day    Lisinopril 2.5 Mg Tabs (Lisinopril) .Marland Kitchen... Take one tablet by mouth daily    Furosemide 20 Mg Tabs (Furosemide) .Marland Kitchen... Take one tablet by mouth daily  Orders: Echocardiogram (Echo)  Problem # 2:  DIASTOLIC HEART FAILURE, CHRONIC (ICD-428.32)  We have suggested she take Lasix on a more frequent basis for her shortness of breath. Ejection fraction is moderately depressed, right-sided pressures are mildly elevated.  Her updated medication list for this problem includes:    Metoprolol Succinate 25 Mg Xr24h-tab (Metoprolol succinate) .Marland Kitchen... Take one tablet by mouth two times a day    Lisinopril 2.5 Mg Tabs (Lisinopril) .Marland Kitchen... Take one tablet by mouth daily    Coumadin 3 Mg Tabs (Warfarin sodium) .Marland Kitchen... Take as directed    Nitrostat 0.4 Mg Subl (Nitroglycerin) .Marland Kitchen... 1 tablet under tongue at onset of chest pain; you may repeat every 5 minutes for up to 3 doses.    Furosemide 20 Mg Tabs (Furosemide) .Marland Kitchen... Take one tablet by mouth daily  Orders: Echocardiogram (Echo)  Problem # 3:  ATRIAL FIBRILLATION (ICD-427.31) She has permanent atrial fibrillation with moderately dilated atria. She would likely not be a good candidate for  cardioversion given these anatomical changes.  Her updated medication list for this problem includes:    Metoprolol Succinate 25 Mg Xr24h-tab (Metoprolol succinate) .Marland Kitchen... Take one tablet by mouth two times a day    Coumadin 3 Mg Tabs (Warfarin sodium) .Marland Kitchen... Take as directed  Orders: EKG w/ Interpretation (93000)  Problem # 4:  HYPERTENSION, HEART CONTROLLED W/O ASSOC CHF (ICD-402.10) Continue her current blood pressure medication regimen.  Her updated medication list for this problem includes:    Metoprolol Succinate 25 Mg Xr24h-tab (Metoprolol succinate) .Marland Kitchen... Take one tablet by mouth two times a day    Lisinopril 2.5 Mg Tabs (Lisinopril) .Marland Kitchen... Take one tablet by mouth daily    Furosemide 20 Mg Tabs (Furosemide) .Marland Kitchen... Take one tablet by mouth daily  Patient Instructions: 1)  Your physician recommends that you schedule a follow-up appointment in: 6 months 2)  Your physician has recommended you make the following change in your medication: INCREASE Lasix 20mg  once daily for shortness of breath. TAKE and EXTRA Metoprolol for Rapid Heartbeat. 3)  TO BE SCHEDULED IN 6 MONTHS: Your physician has requested that you have an echocardiogram.  Echocardiography is a painless test that uses sound waves to create images of your heart. It provides your doctor with information about the size and shape of your heart and how well your heart's chambers and valves are working.  This procedure takes approximately one hour. There are no restrictions for this procedure.

## 2010-03-14 ENCOUNTER — Telehealth: Payer: Self-pay | Admitting: Cardiovascular Disease

## 2010-03-23 NOTE — Letter (Signed)
Summary: [facility]   Imported By: Marylou Mccoy 03/11/2010 11:28:09  _____________________________________________________________________  External Attachment:    Type:   Image     Comment:   External Document

## 2010-03-23 NOTE — Progress Notes (Signed)
Summary: Lasix/Lexiscan   Phone Note Call from Patient   Caller: Patient Summary of Call: Patient just wanted to make sure she could just have test done when she felt like it. Initial call taken by: Bishop Dublin, CMA,  March 14, 2010 8:37 AM  Follow-up for Phone Call        Pt had also called Friday, wanted to notify us that the Lasix does help her however it "irritates her bladder, with burning/pain" and the metoprolol "slows her down more." Pt states she will try to take the Lasix ever other day for this reason. Pt was notified that she could let us know when she wanted to schedule lexiscan per last office note. Follow-up by: Lanny Hurst RN,  March 14, 2010 4:44 PM

## 2010-06-14 NOTE — Assessment & Plan Note (Signed)
[facility] OFFICE NOTE   PRACHI, OFTEDAHL                       MRN:          [id number]  DATE:11/01/2007                            DOB:          [birth year]    PRIMARY CARDIOLOGIST:  Jesse Sans. Wall, MD, [facility]   HISTORY OF PRESENT ILLNESS:  Ms. Cutbirth is a pleasant 77 year old  Caucasian female with a past medical history significant for chronic  persistent atrial fibrillation, chronic diastolic dysfunction,  hypertension, hyperlipidemia, obesity, and history of an acute inferior  wall myocardial infarction in October 2007, who was followed in this  office by Dr. Juanito Doom.  She comes in today after calling in earlier  stating that she was having daily episodes of substernal chest pain.  She states that she has been taking all of her medications; however,  approximately 2 weeks ago, began to have daily episodes of sharp  stabbing substernal pain that lasted for 10-30 seconds.  Yesterday, she  had one of these pains and took a nitroglycerin which seemed to make it  go away quickly.  There has been little association of shortness of  breath or diaphoresis with the pain; however, she does note increased  perspiration in general over the last few weeks.  She thinks that her  breathing has become slightly more difficult over the last few weeks.  She states that she is not a very active individual and has not really  noticed this chest discomfort when she is exerting herself.  She called  our office today because she had an onset of the chest pain last evening  with radiation into her left arm.  This lasted for approximately 2  minutes and then resolved after taking 1 sublingual nitroglycerin.  Of  note, the patient was treated for an acute inferior wall myocardial  infarction at [facility] in October 2007.  During  that catheterization, she was found to have a total occlusion of a very  small  distal posterior descending artery branch from the right coronary  artery.  Her other coronary arteries were reported to be normal at that  time.  It was felt that this was most likely an acute embolic event from  her atrial fibrillation.  She has been maintained on chronic Coumadin  since that hospitalization.  Her INR level was followed in her primary  care physician's office.   She has had a history of congestive heart failure; however, today she  describes no change in her weight and no lower extremity edema.  She has  noted several episodes over the last few weeks of her heart racing.  This seems to occur after she eats large meals.   PAST MEDICAL HISTORY:  Unchanged, since her last visit in this office  and is described in detail above.   CURRENT MEDICATIONS:  1. Lisinopril 2.5 mg once daily.  2. Coumadin 4 mg 5 days per week and 3 mg the other 2 days of the      week.  3. Levothyroxine 50 mcg once daily.  4. Zolpidem 10  mg once daily.  5. Metoprolol 50 mg once daily.  6. Estradiol patch 0.1 mg once weekly.  7. Zetia 10 mg once daily.  8. Vitamin E 3 tablets once daily.   PHYSICAL EXAMINATION:  GENERAL:  She is a pleasant, obese, elderly  female in no acute distress.  VITAL SIGNS:  Her blood pressure is 124/80, her pulse is 78 and  irregular, and respiratory rate is 12 and nonlabored.  NECK:  No JVD.  No carotid bruits.  No lymphadenopathy.  No thyromegaly.  SKIN:  Warm and dry.  HEENT:  Oropharynx clear.  Mucous membranes moist.  LUNGS:  Clear to auscultation bilaterally without wheezes, rhonchi, or  crackles noted.  CARDIOVASCULAR:  Irregularly irregular with no murmurs, gallops, or rubs  noted.  ABDOMEN:  Soft and nontender.  Bowel sounds are present.  EXTREMITIES:  No evidence of edema.  Pulses are 2+ in all extremities.   DIAGNOSTIC STUDIES:  1. A 12-lead electrocardiogram obtained in our office today shows      atrial fibrillation with ventricular rate of 78  beats per minute.      This is a low-voltage EKG; however, it does not appear to be      acutely changed since an EKG dated on May 17, 2007.  2. Echocardiogram performed on November 27, 2006, demonstrated normal      left ventricular function with an ejection fraction of 50-55%.      There were no left ventricular regional wall motion abnormalities      noted.  Left ventricular wall thickness was mildly increased.      There was mild mitral valvular regurgitation.  Left atrium and      right atrium were both dilated.   ASSESSMENT AND PLAN:  This is a pleasant obese 77 year old Caucasian  female with a past medical history significant for chronic persistent  atrial fibrillation, hypertension, hyperlipidemia, chronic diastolic  dysfunction, and prior thrombotic acute coronary artery syndrome who  presents to our office today with complaints of atypical substernal  chest pain occurring over the last 2 weeks.  Her chest pain generally  has occurred while at rest and resolves quickly.  There are no  associated symptoms of diaphoresis, shortness of breath, nausea or  vomiting.  The patient is mostly concerned about her chest pain because  it radiated into her left arm last night.  She also describes  palpitations, which could be consistent with episodes of rapid  ventricular response with her chronic persistent atrial fibrillation.  Today, I am going to have her wear a 48-hour Holter monitor during the  weekend to see what her average ventricular rate is.  I would also like  to see if there is any association of her heart rate to her episodes of  substernal chest pain.  We will have her come into the [facility]  next week to have an exercise treadmill myocardial perfusion study to  rule out any coronary ischemia.  I have asked the patient to come  immediately to the emergency room if she should have any substernal  chest pain that is heavy in nature and associated with diaphoresis,   nausea, vomiting or shortness of breath.  She understands the importance  of seeking urgent medical attention if her symptoms change in character.  We will have her followup in this office with Dr. Juanito Doom, her primary  cardiologist, in 3-4 weeks.     Verne Carrow, MD  Electronically Signed    CM/MedQ  DD: 11/01/2007  DT: 11/02/2007  Job #: [id number]

## 2010-06-14 NOTE — Assessment & Plan Note (Signed)
[facility] OFFICE NOTE   [name], [name]                       MRN:          [id number]  DATE:11/22/2006                            DOB:          October 14, 1933    [name] comes in today after experiencing acute congestive heart  failure.  She called the office on November 16, 2006, and said she was  wheezing, was short of breath, felt some tightness in her chest, and had  gained 8 pounds of fluid in 3 days.  She was at the [facility] and was eating  out quite frequently.  She called as soon as she returned.  Her son was  so worried he wanted to take her to the hospital.   Dr. Antoine Poche recommended to start Lasix daily as opposed to p.r.n.  She  took 40 mg and then 20 mg daily since then.  She is on potassium 10 mEq  a day as well.  She is now back to the weight she had on November 07, 2006, at 216.  She still has a little bit of swelling in her hands and  feet but she denies any wheezing, shortness of breath, orthopnea, or  PND.   Her other problems are well outlined in the chart.   She had her MI at [facility].  Her EF was said to be 56%.  We have not  objectively reassessed this.   MEDICATIONS:  Identical to the last visit, except she is now taking  furosemide 20 mg a day and potassium 10 mEq a day.   PHYSICAL EXAMINATION:  VITAL SIGNS:  Her blood pressure today is 138/83.  Her pulse was 80 and irregular.  Her weight is 216.  Respiratory rate is  18.  HEENT:  Normocephalic atraumatic.  NECK:  There is no obvious JVD.  Carotid upstrokes are equal bilaterally  without bruits.  There is no thyromegaly.  LUNGS:  Clear to auscultation.  HEART:  Reveals an irregular rate and rhythm without S3 gallop.  ABDOMEN:  Protuberant.  Good bowel sounds.  EXTREMITIES:  Reveal trace edema.  Pulses were present.  NEUROLOGIC:  Intact.  SKIN:  Shows a few ecchymoses.   ASSESSMENT/PLAN:  Acute on chronic diastolic congestive heart  failure.  This is secondary to age, hypertension, history of coronary disease with  an infarct and a history of normal left ventricular systolic function.  I think this particular event was brought on by dietary indiscretion.   RECOMMENDATIONS:  1. A 2D echocardiogram to reassess LV systolic function.  2. Continue Furosemide 20 mg a day and potassium 10 mEq daily.  I told      her to take these in the morning for more effectiveness.  3. Check chem-7 today.  4. Follow up with me in 2 weeks to re-discuss.  5. Dietary discretion was strongly reinforced.     Thomas C. Daleen Squibb, MD, [facility]  Electronically Signed    TCW/MedQ  DD: 11/22/2006  DT: 11/22/2006  Job #: [id number]   cc:   Julieanne Manson

## 2010-06-14 NOTE — Assessment & Plan Note (Signed)
[facility] OFFICE NOTE   Tracy Mann, Tracy Mann                       MRN:          [id number]  DATE:05/17/2007                            DOB:          Jun 16, 1933    Tracy Mann returns today for followup.   PROBLEM LIST:  1. Chronic atrial fibrillation.  2. Chronic diastolic dysfunction.  3. Anticoagulation.  4. History of an acute inferior wall infarct secondary to a coronary      embolus probably secondary to her atrial fibrillation.  She had      nonobstructive disease otherwise.  5. Hypertension.  6. Obesity.  7. Mixed hyperlipidemia.   Other than gaining some weight, she has no complaints.  She denies any  palpitations.  She seems to be very compliant with her meds.   MEDICATIONS:  1. Lisinopril 2.5 mg a day.  2. Warfarin as directed.  3. Levothyroxine 50 mcg a day.  4. Zolpidem 10 mg a day.  5. Metoprolol 50 mg a day.  6. Estradiol patch 0.1 mg applied each week.  7. Fish oil 3 a day.  8. Furosemide 20 mg a day.   PHYSICAL EXAMINATION:  VITAL SIGNS:  Blood pressure 124/86, her pulse is  84 and irregular.  EKG shows stable atrial fibrillation.  Her weight is  up 10 pounds at 226.  HEENT:  Unchanged.  NECK:  Carotids are full.  No bruits.  LUNGS:  Clear.  HEART:  Irregular rate and rhythm.  No gallop.  ABDOMEN:  Soft.  EXTREMITIES:  1+ edema.  Pulses are intact.  NEUROLOGIC:  Exam is intact.   ASSESSMENT:  I think Tracy Mann is doing well.  I have made no changes in  her medical program.  I do not think she will tolerate increasing  furosemide to 40 mg a day as she and I  discussed.  I have told her to try not to gain any further weight and to  certainly watch her sodium.  I will see her back in 6 months.     Thomas C. Daleen Squibb, MD, [facility]  Electronically Signed    TCW/MedQ  DD: 05/17/2007  DT: 05/17/2007  Job #: [id number]   cc:   Julieanne Manson

## 2010-06-14 NOTE — Assessment & Plan Note (Signed)
[facility] OFFICE NOTE   Tracy Mann, Tracy Mann                       MRN:          [id number]  DATE:01/28/2008                            DOB:          05-Dec-1933    PRIMARY CARE PHYSICIAN:  Julieanne Manson, MD   HISTORY OF PRESENT ILLNESS:  Tracy Mann is a pleasant 77 year old  Caucasian female with a past medical history significant for chronic  persistent atrial fibrillation on Coumadin therapy, chronic diastolic  dysfunction, hypertension, hyperlipidemia, obesity, and history of an  acute inferior wall myocardial infarction that was felt to be secondary  to a thrombus in October 2007 at [facility], who was  followed in our Cardiology Office.  The patient was last seen in our  office on November 26, 2007, at which time, we reviewed the results of a  stress test, which showed a fixed mid apical inferior defect and a small  area of anterior wall reversibility that was felt to be likely secondary  to breast attenuation artifact.  I also had her aware of 48-hour Holter  monitor, which showed that she was in persistent atrial fibrillation  with one episode of rapid ventricular response.  Tracy Mann main  complaints in the past have been that of chest pain as well as some  lower extremity edema, flushing in her face and neck and an overall  feeling of fatigue.   She comes in today and tells me that she has had no episodes of chest  discomfort.  Her breathing has been at baseline.  She tells me that she  was started on an increased dose of estrogen by her primary care  physician and has noticed some decrease in the episodes of facial  flushing since this medication was increased.  Her major complaint today  is that of an overall feeling of fatigue over the last several weeks.  She tells me that she does not feel like she has energy to perform any  of her activities of daily living.  The patient has  been in chronic  atrial fibrillation for several years and has not had this level of  fatigue with her atrial fibrillation before.  Her ejection fraction is  known to be normal, although she does have mild diastolic dysfunction.  She has no other complaints at the current time.  She denies any  shortness of breath with exertion, nausea, palpitations, near syncope,  or syncope.  She has her INR level followed in her primary care  physician's office.   PAST MEDICAL HISTORY:  Unchanged and detailed above.   CURRENT MEDICATIONS:  1. Lisinopril 2.5 mg once daily.  2. Coumadin as directed.  3. Zolpidem 10 mg once daily.  4. Metoprolol extended release 50 mg once daily.  5. Estradiol patch 1.0 mg once weekly.  6. Zetia 10 mg once daily.  7. Vitamin E 3 tablets once daily.  8. Synthroid 75 mcg once daily.   REVIEW OF SYSTEMS:  As stated in history of present illness as otherwise  negative.   PHYSICAL EXAMINATION:  VITAL  SIGNS:  Blood pressure 130/78, pulse 68,  respirations 12 and nonlabored.  GENERAL:  She is a pleasant, mildly obese, elderly Caucasian female in  no acute distress.  She is alert and oriented x3.  PSYCHIATRIC:  Mood and affect are appropriate.  SKIN:  Slightly warm and dry.  NECK:  No JVD.  No carotid bruits.  No lymphadenopathy.  No thyromegaly.  HEENT:  Oropharynx clear.  Mucous membranes are moist.  LUNGS:  Clear to auscultation bilaterally without wheezes, rhonchi, or  crackles noted.  CARDIOVASCULAR:  Irregularly irregular rhythm with no murmurs, gallops,  or rubs noted.  ABDOMEN:  Soft and nontender.  Bowel sounds are present.  EXTREMITIES:  No evidence of edema.  Pulses are 2+ in all extremities.   DIAGNOSTIC STUDIES:  There are no diagnostic studies to review today.   ASSESSMENT/PLAN:  1. Chronic persistent atrial fibrillation.  The patient has been      maintained on Coumadin and as her INR followed in Dr. Elisabeth Cara      office.  She seems to have  adequate rate control with the current      dose of extended-release metoprolol at 50 mg once daily.  I do not      think that any changes need to be made in her rate control agents      at the current time.  The patient is asking if she can change this      to twice daily.  I will have her break this in half and take 25 mg      in the morning and 25 mg in the evening.  I am not sure that this      medication has anything to do with her overall feeling of fatigue      and she has been maintained on this dose for sometime.  The patient      and I have also discussed the possibility of a cardioversion in the      future.  She had these discussions with Dr. Daleen Squibb, several years      ago; however, the patient was feeling good at that time and chose      not to go for an outpatient direct current cardioversion.  I      certainly think that we could consider this in the future if the      patient is agreeable with this plan.  2. Coronary artery disease as outlined above.  The patient did have      inferior wall myocardial infarction at [facility] in      October 2007, that was felt to be secondary to a thrombus that was      most likely from embolic from her atrial fibrillation.  The      patient's other coronary arteries were noted to be essentially      normal during that catheterization.  Her recent stress test shows a      fixed inferior wall scar with a mild area of anterior wall      reversibility that is most likely suggestive of breast attenuation      artifact.  I do not think any further workup is necessary at the      current time as she remains chest pain free.  3. Hyperlipidemia.  This is followed in the office of Dr. Sullivan Lone.  4. Hypothyroidism.  The patient's TSH was elevated during her last      visit.  We started  her back on her Synthroid.  We will check      another TSH level today as this could be responsible for her level      of fatigue.   I would like to see, [name], back in our office in 6 months.  I have  encouraged her to continue to follow up with Dr. Sullivan Lone as well.  She  tells me that she has an appointment to see Dr. Sullivan Lone in several weeks  and we will let him know about her fatigue at that time.  I do not see  any clear  cardiac reason for her excess fatigue at the current time.  The patient  is aware that she should call our office if she has any change in her  clinical status in the meantime.     Verne Carrow, MD  Electronically Signed    CM/MedQ  DD: 01/28/2008  DT: 01/29/2008  Job #: [id number]   cc:   Julieanne Manson

## 2010-06-14 NOTE — Assessment & Plan Note (Signed)
[facility] OFFICE NOTE   Tracy Mann, Tracy Mann                       MRN:          [id number]  DATE:12/07/2006                            DOB:          [birth year]    [name] returns today to discuss the findings of her 2D echocardiogram.   Fortunately, she has good LV function, systolic function with an EF of  50-55%.  She has mild left ventricular hypertrophy.  She had no regional  wall motion abnormalities.  There was mild mitral valve regurgitation,  moderate left atrial dilatation with an area of 43 mm and mild right  atrial dilatation.  She had mild tricuspid valve regurgitation, normal  right ventricular chamber size and function.  No pericardial effusion.   She clearly has chronic diastolic dysfunction and has multiple risk  factors for this.  I have discussed these with her quite openly.  She  really needs to watch her diet, drop her weight about another 10 pounds,  which she had done previously, and watch her sodium.  She was pleased  with the overall results.  We made no changes in her program.  I will  see her back again in six months.     Thomas C. Daleen Squibb, MD, [facility]  Electronically Signed    TCW/MedQ  DD: 12/07/2006  DT: 12/07/2006  Job #: [id number]   cc:   Julieanne Manson

## 2010-06-14 NOTE — Assessment & Plan Note (Signed)
[facility] OFFICE NOTE   Tracy Mann, Tracy Mann                       MRN:          [id number]  DATE:11/26/2007                            DOB:          March 18, 1933    PRIMARY CARE PHYSICIAN:  Julieanne Manson, MD   HISTORY OF PRESENT ILLNESS:  Tracy Mann is a pleasant 77 year old  Caucasian female with past medical history significant for chronic  persistent atrial fibrillation on Coumadin therapy, chronic diastolic  dysfunction, hypertension, hyperlipidemia, obesity and history of an  acute inferior wall myocardial infarction that was felt to be secondary  to a thrombus in October 2007 at [facility] who was  initially seen in my office with complaints of chest pain on November 01, 2007.  The patient had been followed by Dr. Juanito Doom in his cardiology  clinic prior to October 2009.  At the time of her last visit several  weeks ago, the patient told me that she had been having daily episodes  of substernal chest pain.  The pain was lasting for 10-30 seconds and  had no associated diaphoresis, nausea, dizziness, or palpitations.  The  patient had radiation of the pain into her left arm on the day of her  visit and for that reason she came in to see me at that time.  Other  than the chest pain, she did complain me then of daily episodes of  diffuse perspiration with sense of flushing in her face.  She was not  sure if this was due to hot flashes, but stated at that time that she  did not recall ever having gone through menopause.  I elected at that  time to perform an outpatient exercise treadmill myocardial perfusion  stress study.  This test was performed on November 08, 2007, at [facility].  There was normal left ventricular function  with a fixed mid to apical inferior defect.  There also appeared to be a  small area of mildly decreased counts in the mid anterior wall that  was  felt to be most likely secondary to breast attenuation artifact.  The  ejection fraction was estimated at 69%.  In addition to this, I also  elected to perform a 48-hour Holter monitor to define what the patient's  underlying rhythm was and to rule out any malignant arrhythmias.  This  demonstrated that the patient was in persistent atrial fibrillation with  only 1 episode of rapid ventricular response that was limited to a short  time.  She had occasional PVCs with several couplets but no ventricular  tachycardia.  There were also some episodes of bradycardia with pauses  with the longest being 2.7 seconds.   Today, the patient comes in to review the results of her test.  She  tells me that she has had less frequent occurrence of her chest pain.  It is now sharp sensation that lasts for several seconds in the center  part of her chest.  There is no radiation of pain and no associated  symptoms of shortness  of breath, diaphoresis, nausea, or palpitations.  She continues to have the daily episodes of excessive perspiration with  flushing in her face.  These are totally unrelated to the chest pain.  I  have also questioned her in detail in regards to any episodes of  dizziness, near syncope, or syncope.  She does have the pauses noted on  her Holter monitor.  However, she denies today having had any episodes  of near syncope or syncope.  She continues to take Coumadin daily and  has her INR level followed in her primary care physician's office.   PAST MEDICAL HISTORY:  Unchanged and is detailed above.   CURRENT MEDICATIONS:  1. Lisinopril 2.5 mg once daily.  2. Coumadin 4 mg all days except for Mondays and Fridays, which she      takes 3 mg.  3. Synthroid 50 mcg once daily (the patient has not been taking this      medication on a regular basis).  4. Zolpidem 10 mg once daily.  5. Metoprolol 50 mg once daily.  6. Estradiol patch 0.1 mg once weekly.  7. Zetia 10 mg once daily.   8. Vitamin E 3 tablets once daily.   REVIEW OF SYSTEMS:  As stated in the history of present illness and is  otherwise negative.   PHYSICAL EXAMINATION:  VITAL SIGNS:  Blood pressure 122/82, pulse 72 and  irregular, respirations 12 and unlabored.  GENERAL:  She is a pleasant mildly obese elderly Caucasian female, in no  acute distress.  She is alert and oriented x3.  PSYCHIATRIC:  Mood and affect are appropriate.  NEUROLOGICAL:  There are no focal neurological deficits.  MUSCULOSKELETAL:  Muscle strength and tone are normal.  SKIN:  Skin is slightly warm and moist.  NECK:  No JVD.  No carotid bruits.  No lymphadenopathy.  No thyromegaly.  HEENT:  Oropharynx clear.  Mucous membranes moist.  LUNGS:  Clear to auscultation bilaterally without wheezes, rhonchi, or  crackles noted.  CARDIOVASCULAR:  Irregularly irregular with no murmurs, gallops, or rubs  noted.  ABDOMEN:  Soft, nontender.  Bowel sounds are present.  EXTREMITIES:  No evidence of edema.  Pulses are 2+ in all extremities.   DIAGNOSTIC STUDIES:  1. Myocardial perfusion stress test performed on November 08, 2007, with      treadmill exercise shows that the patient had normal left      ventricular function with an ejection fraction of 69%.  There was a      fixed mid to apical inferior defect.  There also appeared to be a      small area of mildly decreased counts in the mid anterior wall,      which was felt to be most likely secondary to breast attenuation      artifact.  2. A 24-hour Holter monitor performed on November 01, 2007, shows      baseline rhythm of atrial fibrillation with 1 episode of rapid      ventricular response.  There are occasional PVCs with couplets but      no ventricular tachycardia.  There are several episodes of      bradycardia with multiple pauses, the longest being 2.7 seconds.   ASSESSMENT AND PLAN:  This is a pleasant 77 year old Caucasian female  with chronic persistent atrial fibrillation on  Coumadin therapy, chronic  diastolic dysfunction, hypertension, hyperlipidemia and nonobstructive  coronary artery disease with a prior myocardial infarction in the  inferior wall in October 2007  felt to be secondary to an embolic event.  She comes in today and tells me that her chest pain has become much less  frequent since her last visit in this office.  She continues to have the  episodes of excessive perspiration.  She tells me that she has had no  hormonal testing to see if she has undergone menopause.  I think that  her symptoms are a little concerning for coronary artery disease,  especially given the mild abnormality on the stress test.  We have  discussed the possibility of performing a left heart catheterization;  however, she declines to do this at the current time.  She is mostly  concerned about holding her Coumadin therapy with the risk of a possible  embolic event.  I have reassured her that we could use Lovenox as a  bridge while we are holding her Coumadin for the heart cath.  She wishes  to delay this at the current time.  We will discuss this at her next  visit in the office in 2 months.  I am also little concerned that she  has stopped taking her Synthroid.  It could be that some of her symptoms  are related to abnormalities in her thyroid levels.  We will check a  thyroid-stimulating hormone as well as a free T4 today.  I have  encouraged the patient to resume taking her Synthroid at 50 mcg once  daily in the meantime.  We will alert her of the lab results and adjust  her medication as necessary.  The patient should also be resumed on baby  aspirin and we will discuss this further at her next visit.  In the  meantime, we will continue her ACE inhibitor and her metoprolol.  She is  not currently on a statin medication but is taking Zetia once daily.  The patient is aware that she should let her our office know if she has  any change in the character of her chest pain  or any increase in  frequency of the chest pain before her next visit in this office.  I  would have a low threshold to perform a left heart catheterization to  rule out obstructive coronary artery disease.  Of note, the left heart  catheterization report from October 2007 at [facility] shows that there was a thrombus in the right coronary artery but  just plaque disease noted in the other vessels.     Verne Carrow, MD  Electronically Signed    CM/MedQ  DD: 11/26/2007  DT: 11/27/2007  Job #: [id number]   cc:   Julieanne Manson

## 2010-06-14 NOTE — Assessment & Plan Note (Signed)
[facility] OFFICE NOTE   Tracy Mann, Tracy Mann                       MRN:          [id number]  DATE:11/08/2006                            DOB:          [birth year]    [name] comes in today because of fatigue.   Her problem list includes:  1. Chronic atrial fibrillation with a well controlled ventricular      rate.  2. Anticoagulation.  3. History of acute inferior wall myocardial infarction secondary to a      coronary embolus, probably from #1.  She has nonobstructive disease      otherwise.  4. Normal left ventricular systolic function.  5. Hypertension.  6. Obesity.  7. Hyperlipidemia.   She called the office on October 12, 2006.  She was advised to cut her  metoprolol back.  She went from 100 mg to 50 mg.  The cardiac  medications include:  1. Furosemide that she takes p.r.n. 20 mg at a time.  2. Coumadin.  3. Lisinopril.   Her blood pressure today is 130/80.  Her pulse is 80 and irregular.  EKG  shows atrial fibrillation with low voltage.  Her weight is back up to  216 which is about a 16 pound weight gain.  HEENT:  Unchanged.  She is in no acute distress, alert and oriented.  Carotid upstrokes are equal bilaterally without bruits.  No JVD.  LUNGS:  Clear.  HEART:  Irregular rate and rhythm.  EXTREMITIES:  Trace edema.  Pulses are intact.  NEURO EXAM:  Intact.   She says her fatigue has improved a little bit since cutting back on  metoprolol.  Her fatigue is obviously multifactorial as well.   RECOMMENDATIONS:  1. Continue metoprolol 50 mg daily.  I have told her to check her      bottles and make sure it is metoprolol succinate for sustained      release.  If not, we will ask her to take 25 mg b.i.d. or call in      the sustained prep 50 mg a day.  2. Weight loss.  3. Volvulus in the ear.     Thomas C. Daleen Squibb, MD, [facility]  Electronically Signed    TCW/MedQ  DD: 11/08/2006  DT:  11/08/2006  Job #: [id number]   cc:   Julieanne Manson

## 2010-06-17 NOTE — Assessment & Plan Note (Signed)
Tracy Mann   Tracy Mann, Tracy Mann                       MRN:          [id number]  DATE:05/02/2006                            DOB:          03-Jan-1934    Tracy Mann returns today for management of the following issues:  1. Chronic atrial fibrillation.  2. Anticoagulation.  3. Acute inferior wall infarct secondary to coronary ebulus, most      likely secondary to #1.  She has nonobstructive disease otherwise.  4. Normal left ventricular systolic function.  5. Hypertension.  6. Obesity.  7. Hyperlipidemia.   She continues to do well with her diet.  She has lost a total of about 7  pounds.  Her lipids on just fish oil a day showed a total cholesterol of  181, triglycerides 190, HDL 43, VLDL 38, LDL 100.  LFTs were normal.  This is a remarkable improvement with almost doubling her HDL from her  baseline.  Her INR was 3.2 at Dr. Elisabeth Cara office on March 16, 2006.   Her medications are unchanged.  Please refer to maintenance medication  list.   PHYSICAL EXAMINATION:  VITAL SIGNS:  Her blood pressure today is 126/78.  Her pulse is 80 and irregular.  Her EKG shows atrial fibrillation, low  voltage, with no change.  Her weight is down to 202.  HEENT:  Normocephalic and atraumatic.  Extraocular movements intact.  Sclerae clear.  Facial symmetry is normal.  NECK:  Carotid upstrokes are equal bilaterally without bruits.  No JVD.  Thyroid is not enlarged.  Trachea is midline.  LUNGS:  Clear.  HEART:  Reveals a poorly appreciated PMI.  She has an irregular rate and  rhythm without gallop.  ABDOMEN:  Exam is soft with good bowel sounds.  EXTREMITIES:  Reveal trace edema.  Pulses are present.   Her biggest concern is retaining fluid after she eats out.  I told her  to take a furosemide 20 mg p.r.n. if she has to do so.   ASSESSMENT AND PLAN:  Tracy Mann is doing well.  I have made no changes at  present.   We will see her back in October 2008.     Thomas C. Daleen Squibb, MD, [facility]  Electronically Signed    TCW/MedQ  DD: 05/02/2006  DT: 05/02/2006  Job #: [id number]   cc:   Julieanne Manson, M.D.

## 2010-06-17 NOTE — Assessment & Plan Note (Signed)
[facility] OFFICE NOTE   Tracy Mann, Tracy Mann                       MRN:          [id number]  DATE:01/10/2006                            DOB:          01-26-1934    Please see my note from December 14, 2005 from my initial consultation.   Dr. Sullivan Lone called me about a week-and-a-half ago and asked me to see  her back to discuss further her lipid management as well as her atrial  fibrillation and the possibility of cardioversion in the future.   She was having all kinds of problems with tachy palpitations, feeling  hot, but now is back on some low-dose estrogen and feels remarkably  better.  I told her this was not optimal, but if it made her feel  better, we would stay with it.   She has done remarkably well with her diet, though her weight has not  changed.  Her last cholesterol profile showed a total cholesterol 192,  triglycerides of 166, HDL 30, LDL 129.  Her INR was therapeutic at 2.1,  hemoglobin was 12.9.   Dr. Sullivan Lone recommended Zetia 10 mg a day.  I think some of this is  because there was concern that she had not tolerated pravastatin in the  past.  Please refer to my previous note for explanation of that.   MEDICATIONS:  1. Aspirin 81 mg a day.  2. Metoprolol 100 daily.  3. Lisinopril 2.5 daily.  4. Warfarin as directed.  5. Protonix 40 mg a day.  6. Zetia 10 mg a day.   Her blood pressure today is 118/58, pulse 80 and irregular, weight 208.  She is in no acute distress.  The rest of her exam is unchanged.   ASSESSMENT AND PLAN:  I spent 30 minutes plus talking with Tracy Mann  about 3 different issues.  1. Estrogen.  I have told her to stay on low-dose estrogen for quality      of life.  She realizes the slight increased risk of stroke and      heart attack with this.  2. Hyperlipidemia.  Prescribed pravastatin 40 mg p.o. nightly, along      with her Zetia 10 mg a day.  She needs an LDL of  60 to 70.  I think      she will tolerate this regimen.  She needs followup blood work in 6      weeks with Dr. Sullivan Lone, whom I will relate this message to.  3. Continue current medications for atrial fibrillation.  Once her      anticoagulation has been therapeutic for 3 weeks or greater, we can      consider outpatient DC cardioversion.  She feels      so well right now I am not sure we will pursue that.  I will see      her back after the first of the year to discuss this further and      see if she wants to opt for this.     Thomas C. Wall, MD,  [facility]  Electronically Signed    TCW/MedQ  DD: 01/10/2006  DT: 01/10/2006  Job #: [id number]   cc:   Julieanne Manson

## 2010-06-17 NOTE — Assessment & Plan Note (Signed)
[facility] OFFICE NOTE   Tracy Mann, Tracy Mann                       MRN:          [id number]  DATE:12/14/2005                            DOB:          1933/04/06    CARDIOLOGY CONSULTATION   CHIEF COMPLAINT:  I am worried about my heart beating irregular, my  triglycerides being so high, and my recent heart attack.   HISTORY OF PRESENT ILLNESS:  Tracy Mann is a pleasant 77 year old widowed  white female from [place] who was in her usual state of health until  sudden onset of chest discomfort and diaphoresis after eating lunch on  November 09, 2005. She was taken emergently to [facility] and  found to be having acute inferior wall infarct with atrial fibrillation. She  was transferred to [facility] emergently. Emergent cath showed a cutoff  of a small branch of the PDA of the right coronary artery. She had  insignificant disease, otherwise, based on her cath reports. Her CPK-MB  peaked at 35. Troponin peaked at 0.68. It was felt that it most likely was a  coronary embolic event from her atrial fib. Her atrial fib was felt to be  new onset.   She also had triglycerides of 986. Her total cholesterol was 228. HDL was  low at 21. LDL could not be calculated.   Her ejection fraction by cath was 56%. I do not see where an echo was done.   Since discharge, she has had some GI problems with gas from either Pravachol  or gemfibrozil. These were both discontinued recently. Her symptoms have  resolved. She had fairly good lipid profile with Dr. Sullivan Lone on both of  these drugs with total cholesterol 162, triglycerides 135, HDL 39, LDL 96.  Other than some short fleeting pains in her chest, she really has no  symptoms. Her Coumadin is being monitored by Dr. [facility].   CURRENT MEDICATIONS:  1. Aspirin 81 mg a day.  2. Metoprolol 100 mg a day.  3. Lisinopril 2.5 mg a day.  4. Warfarin  as directed.   ALLERGIES:  She is intolerant of SULFA.   HABITS:  She does not smoke or drink or use any recreational drugs. She  rarely uses any caffeinated beverages. She enjoys walking.   PAST SURGICAL HISTORY:  Hysterectomy in 1996, tubal ligation in 1968,  cholecystectomy in 1999.   FAMILY HISTORY:  Mother died of probable heart problems at age 13.   SOCIAL HISTORY:  She is widowed. She has four children and two  grandchildren. She lives in [place].   REVIEW OF SYSTEMS:  CONSTITUTIONAL:  No weight loss, fever, chills, sweats.  She was having a lot of sweats prior to her MI which she attributed to being  low on estrogen. Her estrogen patch has been discontinued. No pain, problems  swallowing. CARDIAC:  See HPI. PULMONARY:  No hemoptysis, hematemesis or  cough. GASTROINTESTINAL:  No melena, hematochezia, gas with either Pravachol  or gemfibrozil as noted above. GENITOURINARY:  Negative. No major problems  other than some chronic  arthritis. NEUROLOGIC:  Negative. PSYCHIATRIC:  Negative.   PHYSICAL EXAMINATION:  VITAL SIGNS:  Blood pressure 122/80, pulse 80 and  irregular, weight 208, height 5 feet 5-1/2 inches.  GENERAL:  She is in no acute distress.  SKIN:  Warm and dry.  HEENT:  Normocephalic, atraumatic. PERRLA. Extraocular movements intact. She  wears glasses. Facial symmetry is normal. Dentition satisfactory.  NECK:  Supple. There is no lymphadenopathy. There is no thyromegaly. Carotid  upstrokes were equal bilaterally without bruits. There is no JVD.  LUNGS:  Clear to auscultation and percussion.  HEART:  Reveals soft S1, S2, irregular rate and rhythm.  ABDOMEN:  Soft with good bowel sounds. Obesity precluded any assessment of  organomegaly.  EXTREMITIES:  Reveal trace edema. Pulses are brisk.  NEUROLOGIC:  Grossly intact.  SKIN:  Reveals no significant ecchymoses.  MUSCULOSKELETAL:  No obvious joint deformities.   Her electrocardiogram shows atrial fib with  somewhat low voltage with  probable old inferior wall infarct. She has poor __________ progression  across the endocardium as well.   ASSESSMENT AND PLAN:  1. Status post coronary embolic inferior wall infarct.  2. Paroxysmal atrial fibrillation.  3. Relatively normal left ventricular systolic function.  4. Anticoagulation.  5. Severe mixed hyperlipidemia with intolerance to either Pravachol or      gemfibrozil. I suspect it was the latter.  6. Hypertension.   I have had about a 30 minute discussion with Tracy Mann today. I have tried to  answer all of her questions. She is having blood work checked with Dr.  Sullivan Lone the first week of December to decide on further hyperlipidemic  therapy. I would like to look at those results as well and talk to Dr.  Sullivan Lone.   She is fairly well rate controlled. Her blood pressure is under good  control, and her Coumadin is being followed closely by Dr. Sullivan Lone.   Therefore, I did not make any changes today.   After our next visit, we will obtain a 2D echocardiogram. It would be nice  to see if we can get her back to normal rhythm at some point.     Thomas C. Daleen Squibb, MD, [facility]  Electronically Signed    TCW/MedQ  DD: 12/14/2005  DT: 12/14/2005  Job #: [id number]   cc:   Tracy Mann, M.D.

## 2010-06-17 NOTE — Assessment & Plan Note (Signed)
[facility] OFFICE NOTE   TAIWAN, MILLON                       MRN:          [id number]  DATE:02/02/2006                            DOB:          December 15, 1933    Tracy Mann comes in today for followup of her atrial fibrillation and  coronary disease.  She said her Coumadin has been well regulated, and  has not been a problem.   She says she feels the best she has felt in quite some time.  She is  walking on a regular basis, having very little shortness of breath and  really no palpitations.  She saw Dr. Sullivan Lone the other day, and they  both felt she might be in normal rhythm.  She is not sure if an EKG was  done.   Her meds are unchanged.   Her blood pressure today is 118/83.  Her pulse was 68 and regular.  However, she occasionally had a premature beat.  The rest of her exam  was unremarkable and unchanged.   I did an EKG for documentation, concerned that she might be in slow  atrial fibrillation with a fairly regular rate.  In deed, she is atrial  fibrillation.   We also spent a considerable amount of time talking about lipid therapy.  The could not tolerate Zetia or Pravastatin because of her hands  cramping up.  At this point, she wants to take fish oil, which she  showed me today, and I have approved.  I told her to be careful with her  pro times for the first few weeks of taking this.  I also told her diet  would do a lot more for her triglycerides than fish oil.   I will plan on seeing her back in April, assuming that she is relatively  asymptomatic with her atrial fibrillation.  She will remain on Coumadin.  She does not wish to have a cardioversion at this time, which I whole  heartedly agree with.     Thomas C. Daleen Squibb, MD, [facility]  Electronically Signed    TCW/MedQ  DD: 02/02/2006  DT: 02/02/2006  Job #: [id number]   cc:   Tracy Mann

## 2010-06-28 ENCOUNTER — Other Ambulatory Visit: Payer: Self-pay | Admitting: Cardiovascular Disease

## 2010-06-28 DIAGNOSIS — I5032 Chronic diastolic (congestive) heart failure: Secondary | ICD-10-CM

## 2010-07-11 ENCOUNTER — Encounter: Payer: Self-pay | Admitting: Cardiovascular Disease

## 2010-08-05 ENCOUNTER — Other Ambulatory Visit: Payer: Self-pay | Admitting: Cardiovascular Disease

## 2010-08-05 MED ORDER — NITROGLYCERIN 0.4 MG SL SUBL: 0.4000 mg | SUBLINGUAL_TABLET | SUBLINGUAL | Status: DC | PRN

## 2010-08-16 ENCOUNTER — Other Ambulatory Visit (INDEPENDENT_AMBULATORY_CARE_PROVIDER_SITE_OTHER): Payer: Medicare Other | Admitting: *Deleted

## 2010-08-16 DIAGNOSIS — I4891 Unspecified atrial fibrillation: Secondary | ICD-10-CM

## 2010-08-16 DIAGNOSIS — I5032 Chronic diastolic (congestive) heart failure: Secondary | ICD-10-CM

## 2010-08-18 ENCOUNTER — Ambulatory Visit (INDEPENDENT_AMBULATORY_CARE_PROVIDER_SITE_OTHER): Payer: Medicare Other | Admitting: Cardiovascular Disease

## 2010-08-18 ENCOUNTER — Encounter: Payer: Self-pay | Admitting: Cardiovascular Disease

## 2010-08-18 ENCOUNTER — Ambulatory Visit: Payer: Self-pay | Admitting: Cardiovascular Disease

## 2010-08-18 DIAGNOSIS — I509 Heart failure, unspecified: Secondary | ICD-10-CM

## 2010-08-18 DIAGNOSIS — I119 Hypertensive heart disease without heart failure: Secondary | ICD-10-CM

## 2010-08-18 DIAGNOSIS — R0602 Shortness of breath: Secondary | ICD-10-CM

## 2010-08-18 DIAGNOSIS — I4891 Unspecified atrial fibrillation: Secondary | ICD-10-CM

## 2010-08-18 DIAGNOSIS — I2510 Atherosclerotic heart disease of native coronary artery without angina pectoris: Secondary | ICD-10-CM

## 2010-08-18 DIAGNOSIS — I5032 Chronic diastolic (congestive) heart failure: Secondary | ICD-10-CM

## 2010-08-18 DIAGNOSIS — E785 Hyperlipidemia, unspecified: Secondary | ICD-10-CM

## 2010-08-18 NOTE — Assessment & Plan Note (Signed)
Previous notes have suggested difficulty tolerating statins. We'll continue to discuss this with her on followup visits.

## 2010-08-18 NOTE — Assessment & Plan Note (Signed)
Right heart pressures are moderately elevated. Edema is stable. Most of her edema is likely from venous insufficiency with mild contribution from fluid overload. Again we have suggested she take additional Lasix for shortness of breath.

## 2010-08-18 NOTE — Progress Notes (Signed)
Patient ID: Tracy Mann, female    DOB: 05/07/1933, 77 y.o.   MRN: [id number]  HPI Comments: Tracy Mann is a 77 year old woman with a PMH significant for chronic atrial fibrillation on Coumadin therapy, diastolic dysfunction, hypertension, hyperlipidemia, obesity, and history of an acute inferior wall myocardial infarction that was felt to be secondary to a thrombus from atrial fib. in October 2007 at [facility], previous episodes of chest pain who presents for followup.     recent echocardiogram has shown ejection fraction is mildly reduced. Moderately elevated right ventricular systolic pressures.   She continues to have mild shortness of breath at times. She is bothered by chronic lower extremity edema. She is scheduled to see Dr. Earnestine Leys next week. Leg edema is worse after she has been standing for long periods. She does not exercise on a regular basis. Her weight continues to be a problem. Less active secondary to her weight and her breathing. Having a cortisone shot in her knee. She feels swollen all over and has malaise. She is also concerned that the Lasix is causing hearing loss.  Previously, stress test and sleep study have been ordered though she has not completed these.   She did refuse to followup with the stress test as previously ordered on her last visit to the clinic. She has also not done a sleep study.   She had a stress perfusion study in 2009 which showed a fixed mid apical inferior defect and a small area of anterior wall reversibility that was felt to be likely secondary to breast attenuation artifact.       Previous 48-hour Holter monitor, which showed that she was in persistent atrial fibrillation with one episode of rapid ventricular response.     EKG shows atrial fibrillation with rate of 67 beats per minute, poor R-wave progression through the precordial leads, nonspecific ST/T-wave changes      Outpatient Encounter Prescriptions as of 08/18/2010    Medication Sig Dispense Refill  . Chromium Picolinate 500 MCG TABS Take 1 tablet by mouth daily.        . furosemide (LASIX) 20 MG tablet Take 20 mg by mouth daily.        Marland Kitchen HYDROcodone-acetaminophen (NORCO) 10-325 MG per tablet Take 1 tablet by mouth as needed.        Marland Kitchen levothyroxine (SYNTHROID, LEVOTHROID) 75 MCG tablet Take 75 mcg by mouth daily.        Marland Kitchen lisinopril (PRINIVIL,ZESTRIL) 2.5 MG tablet Take 2.5 mg by mouth daily.        Marland Kitchen LORazepam (ATIVAN) 1 MG tablet Take 1 mg by mouth 2 (two) times daily as needed.        . Magnesium 500 MG TABS Take 1 tablet by mouth daily.        . metoprolol succinate (TOPROL-XL) 25 MG 24 hr tablet Take 25 mg by mouth 2 (two) times daily.        . nitrofurantoin, macrocrystal-monohydrate, (MACROBID) 100 MG capsule Take 100 mg by mouth daily.        . nitroGLYCERIN (NITROSTAT) 0.4 MG SL tablet Place 1 tablet (0.4 mg total) under the tongue every 5 (five) minutes as needed. May repeat for up to 3 doses.  90 tablet  3  . phenazopyridine (PYRIDIUM) 100 MG tablet Take 100 mg by mouth as needed.        . potassium chloride (KLOR-CON) 10 MEQ CR tablet Take 5 mEq by mouth daily.        Marland Kitchen  warfarin (COUMADIN) 3 MG tablet Take 3 mg by mouth as directed.        . zolpidem (AMBIEN) 10 MG tablet Take 10 mg by mouth daily.           Review of Systems  Constitutional: Positive for fatigue.  HENT: Negative.   Eyes: Negative.   Respiratory: Positive for shortness of breath.   Cardiovascular: Positive for leg swelling.  Gastrointestinal: Negative.   Musculoskeletal: Positive for joint swelling and gait problem.  Skin: Negative.   Neurological: Negative.   Hematological: Negative.   Psychiatric/Behavioral: Negative.   All other systems reviewed and are negative.    BP 136/83  Pulse 70  Ht 5\' 5"  (1.651 m)  Wt 234 lb (106.142 kg)  BMI 38.94 kg/m2  Physical Exam  Nursing note and vitals reviewed. Constitutional: She is oriented to person, place, and time.  She appears well-developed and well-nourished.       Obese.  HENT:  Head: Normocephalic.  Nose: Nose normal.  Mouth/Throat: Oropharynx is clear and moist.  Eyes: Conjunctivae are normal. Pupils are equal, round, and reactive to light.  Neck: Normal range of motion. Neck supple. No JVD present.  Cardiovascular: Normal rate, regular rhythm, S1 normal, S2 normal, normal heart sounds and intact distal pulses.  Exam reveals no gallop and no friction rub.   No murmur heard.      Edema is nonpitting. She has varicies.  Pulmonary/Chest: Effort normal and breath sounds normal. No respiratory distress. She has no wheezes. She has no rales. She exhibits no tenderness.  Abdominal: Soft. Bowel sounds are normal. She exhibits no distension. There is no tenderness.  Musculoskeletal: Normal range of motion. She exhibits edema. She exhibits no tenderness.  Lymphadenopathy:    She has no cervical adenopathy.  Neurological: She is alert and oriented to person, place, and time. Coordination normal.  Skin: Skin is warm and dry. No rash noted. No erythema.  Psychiatric: She has a normal mood and affect. Her behavior is normal. Judgment and thought content normal.         Assessment and Plan

## 2010-08-18 NOTE — Patient Instructions (Signed)
You are doing well. No medication changes were made. Please take an extra lasix for shortness of breath Please call us if you have new issues that need to be addressed before your next appt.  We will call you for a follow up Appt. In 6 months

## 2010-08-18 NOTE — Assessment & Plan Note (Signed)
Currently with no symptoms of angina. No further workup at this time. Continue current medication regimen. 

## 2010-08-18 NOTE — Assessment & Plan Note (Signed)
Blood pressure is well controlled on today's visit. No changes made to the medications. 

## 2010-08-18 NOTE — Assessment & Plan Note (Signed)
Atrial fibrillation rate is well controlled. This is likely contributing to her underlying diastolic dysfunction and elevated right heart pressures. We have encouraged her to increase her Lasix. She is brought in to do so as she believes it causes hearing loss. We have suggested she take additional Lasix for shortness of breath.

## 2010-08-19 ENCOUNTER — Telehealth: Payer: Self-pay | Admitting: Cardiovascular Disease

## 2010-08-19 ENCOUNTER — Telehealth: Payer: Self-pay | Admitting: *Deleted

## 2010-08-19 NOTE — Telephone Encounter (Signed)
Patient LMOM for the nurse to call her back yesterday.  States that she has not received a call back yet and needs to ask a few questions.  Please advise.

## 2010-08-19 NOTE — Telephone Encounter (Signed)
Patient wanted to let you know that she is only taking the metoprolol succ 25 mg in the pm.  She is concerned because of her mitral valve problem and wants to know if she should be taken the metoprolol 25 mg twice a day or is okay to take only the metoprolol succ 25 mg once daily.  She feels better taking the metoprolol 25 mg in the pm only but if you feel it would benefit her more to take twice a day, she will do so. Please advise what to do.

## 2010-08-19 NOTE — Telephone Encounter (Signed)
Message left on nurse voicemail that patient forgot to ask MD during office visit whether or not her mitral valve problem will get worse or be stable.

## 2010-08-19 NOTE — Telephone Encounter (Signed)
Duplicate message. 

## 2010-08-19 NOTE — Telephone Encounter (Signed)
See note on metoprolol.

## 2010-08-21 NOTE — Telephone Encounter (Signed)
Would continue the same amount of metoprolol that she was taking when we just saw her. We did not change it. Her heart rate was perfect. If she was taking twice a day, would continue twice a day. Would increase lasix as we discussed at visit, especially for SOB

## 2010-08-22 NOTE — Telephone Encounter (Signed)
She will also increase the fluid pill as discussed a last office visit with Dr. Mariah Milling.

## 2010-08-22 NOTE — Telephone Encounter (Signed)
Notified patient per Dr. Mariah Milling would continue the same dose of metoprolol she was taken when came to see Dr.[name] last.

## 2010-10-11 ENCOUNTER — Telehealth: Payer: Self-pay | Admitting: *Deleted

## 2010-10-11 NOTE — Telephone Encounter (Signed)
Pt called stating she has discussed having gastric bypass with her pcp, Dr. Sullivan Lone, and he would want Dr. Windell Hummingbird approval from cardiac standpoint prior to discussing she have this surgery. Pt seen 08/18/10 and had echo 08/16/10, please advise.

## 2010-10-12 NOTE — Telephone Encounter (Signed)
She needs follow up as she had significant fluid overload on echo. She was supposed to double lasix and come in for follow up If she had surgery with that much fluid, they would have a difficult time getting her off the ventilator

## 2010-10-12 NOTE — Telephone Encounter (Signed)
Pt had echo 7/17 and then f/u on 7/19. She was told to double lasix at that time for incr SOB. At that appt told to f/u in 6 mo. I notified pt of msg below, she did double lasix at the time, but now is off lasix. Pt is still deciding if she will have bypass surgery. I advised pt with hx and need for possible clearance as well to schedule f/u with Dr. Mariah Milling. Pt ok with this.

## 2010-10-25 ENCOUNTER — Encounter: Payer: Medicare Other | Admitting: Cardiovascular Disease

## 2010-10-31 ENCOUNTER — Encounter: Payer: Self-pay | Admitting: Cardiovascular Disease

## 2010-11-02 ENCOUNTER — Ambulatory Visit (INDEPENDENT_AMBULATORY_CARE_PROVIDER_SITE_OTHER): Payer: Medicare Other | Admitting: Cardiovascular Disease

## 2010-11-02 ENCOUNTER — Encounter: Payer: Self-pay | Admitting: Cardiovascular Disease

## 2010-11-02 VITALS — BP 125/80 | HR 83 | Ht 65.0 in | Wt 237.0 lb

## 2010-11-02 DIAGNOSIS — E669 Obesity, unspecified: Secondary | ICD-10-CM

## 2010-11-02 DIAGNOSIS — E66811 Obesity, class 1: Secondary | ICD-10-CM | POA: Insufficient documentation

## 2010-11-02 DIAGNOSIS — I509 Heart failure, unspecified: Secondary | ICD-10-CM

## 2010-11-02 DIAGNOSIS — R0602 Shortness of breath: Secondary | ICD-10-CM

## 2010-11-02 DIAGNOSIS — I119 Hypertensive heart disease without heart failure: Secondary | ICD-10-CM

## 2010-11-02 DIAGNOSIS — I5032 Chronic diastolic (congestive) heart failure: Secondary | ICD-10-CM

## 2010-11-02 DIAGNOSIS — I4891 Unspecified atrial fibrillation: Secondary | ICD-10-CM

## 2010-11-02 MED ORDER — METOLAZONE 2.5 MG PO TABS: 2.5000 mg | ORAL_TABLET | Freq: Two times a day (BID) | ORAL | Status: DC | PRN

## 2010-11-02 NOTE — Assessment & Plan Note (Signed)
Chronic atrial fibrillation. She has stopped one of the metoprolol succinate pills. I suggested that if b.i.d. Dosing makes her fatigued, that she take the metoprolol in the morning and monitor her heart rate.

## 2010-11-02 NOTE — Progress Notes (Signed)
Patient ID: Tracy Mann, female    DOB: 07-08-1933, 77 y.o.   MRN: [id number]  HPI Comments: Tracy Mann is a 77 year old woman with a PMH significant for chronic atrial fibrillation on Coumadin therapy, diastolic dysfunction, hypertension, hyperlipidemia, obesity, and history of an acute inferior wall myocardial infarction that was felt to be secondary to a thrombus from atrial fib. in October 2007 at [facility], previous episodes of chest pain who presents for followup.      echocardiogram has shown ejection fraction is mildly reduced. Moderately elevated right ventricular systolic pressures.  She reports that she has cut her beta blocker back to once an evening and stopped the morning dose as it was causing fatigue.   She continues to have  shortness of breath  She is bothered by chronic lower extremity edema. She does not exercise on a regular basis. Her weight continues to be a problem. Less active secondary to her weight and her breathing and knees. She is also concerned that the Lasix is causing hearing loss.  She did refuse to followup with the stress test as previously ordered on her last visit to the clinic. She has also not done a sleep study.   She had a stress perfusion study in 2009 which showed a fixed mid apical inferior defect and a small area of anterior wall reversibility that was felt to be likely secondary to breast attenuation artifact.       Previous 48-hour Holter monitor, which showed that she was in persistent atrial fibrillation with one episode of rapid ventricular response.     EKG shows atrial fibrillation with rate of 89 beats per minute, poor R-wave progression through the precordial leads, nonspecific ST/T-wave changes      Outpatient Encounter Prescriptions as of 11/02/2010  Medication Sig Dispense Refill  . HYDROcodone-acetaminophen (NORCO) 10-325 MG per tablet Take 1 tablet by mouth as needed.        Marland Kitchen levothyroxine (SYNTHROID,  LEVOTHROID) 75 MCG tablet Take 75 mcg by mouth daily.        Marland Kitchen lisinopril (PRINIVIL,ZESTRIL) 2.5 MG tablet Take 2.5 mg by mouth daily.        Marland Kitchen LORazepam (ATIVAN) 1 MG tablet Take 1 mg by mouth 2 (two) times daily as needed.        . Magnesium 500 MG TABS Take 1 tablet by mouth daily.        . metoprolol succinate (TOPROL-XL) 25 MG 24 hr tablet Take 25 mg by mouth daily.       . nitrofurantoin, macrocrystal-monohydrate, (MACROBID) 100 MG capsule Take 100 mg by mouth daily.        . nitroGLYCERIN (NITROSTAT) 0.4 MG SL tablet Place 1 tablet (0.4 mg total) under the tongue every 5 (five) minutes as needed. May repeat for up to 3 doses.  90 tablet  3  . phenazopyridine (PYRIDIUM) 100 MG tablet Take 100 mg by mouth as needed.        . warfarin (COUMADIN) 2.5 MG tablet Take 2.5 mg by mouth daily.        Marland Kitchen zolpidem (AMBIEN) 10 MG tablet Take 10 mg by mouth daily.           Review of Systems  Constitutional: Positive for fatigue.  HENT: Negative.   Eyes: Negative.   Respiratory: Positive for shortness of breath.   Cardiovascular: Positive for leg swelling.  Gastrointestinal: Negative.   Musculoskeletal: Positive for joint swelling and gait problem.  Skin: Negative.  Neurological: Negative.   Hematological: Negative.   Psychiatric/Behavioral: Negative.   All other systems reviewed and are negative.    BP 125/80  Pulse 83  Ht 5\' 5"  (1.651 m)  Wt 237 lb (107.502 kg)  BMI 39.44 kg/m2  Physical Exam  Nursing note and vitals reviewed. Constitutional: She is oriented to person, place, and time. She appears well-developed and well-nourished.       Obese.  HENT:  Head: Normocephalic.  Nose: Nose normal.  Mouth/Throat: Oropharynx is clear and moist.  Eyes: Conjunctivae are normal. Pupils are equal, round, and reactive to light.  Neck: Normal range of motion. Neck supple. No JVD present.  Cardiovascular: Normal rate, regular rhythm, S1 normal, S2 normal, normal heart sounds and intact  distal pulses.  Exam reveals no gallop and no friction rub.   No murmur heard.      Edema is nonpitting. She has varicies.  Pulmonary/Chest: Effort normal and breath sounds normal. No respiratory distress. She has no wheezes. She has no rales. She exhibits no tenderness.  Abdominal: Soft. Bowel sounds are normal. She exhibits no distension. There is no tenderness.  Musculoskeletal: Normal range of motion. She exhibits edema. She exhibits no tenderness.  Lymphadenopathy:    She has no cervical adenopathy.  Neurological: She is alert and oriented to person, place, and time. Coordination normal.  Skin: Skin is warm and dry. No rash noted. No erythema.  Psychiatric: She has a normal mood and affect. Her behavior is normal. Judgment and thought content normal.         Assessment and Plan

## 2010-11-02 NOTE — Assessment & Plan Note (Signed)
Blood pressure is well controlled on today's visit. No changes made to the medications. 

## 2010-11-02 NOTE — Assessment & Plan Note (Signed)
She did discuss having possible gastric bypass. It is clear that she is unaware of the potential risk and benefit of such a major surgery. I have asked her to consider a laparoscopic banding or pursuing any strict diet first. I did suggest there are other medications for weight loss that might be approved later in the year that might prove to be less risky.   She would certainly be at higher risk and I did suggest if she wanted to have gastric bypass, we would recommend a stress test first. She has refused a stress test in the past. One has not been ordered at this time and we will await to see what her decision is. She would also benefit from improved hemodynamics and taking the diuretic on a consistent basis prior to any major surgery

## 2010-11-02 NOTE — Patient Instructions (Addendum)
You are doing well. Please change the metoprolol succinate to the morning Start metolazone daily PRN for edema and shortness of breath Please call us if you have new issues that need to be addressed before your next appt.  We will call you for a follow up Appt. In 6 months

## 2010-11-02 NOTE — Assessment & Plan Note (Signed)
Her continued shortness of breath is likely from diastolic dysfunction, pulmonary hypertension which was seen on echocardiogram earlier this year. She has been reluctant to take Lasix given suspected hearing loss/side effects.  We will prescribe metolazone 2.5 mg to be taken p.r.n.. I suggested she try to take this daily and to limit her p.o. Intake. She does report having interstitial cystitis and bladder irritation and does not like taking diuretics in general.

## 2010-11-02 NOTE — Assessment & Plan Note (Signed)
Diastolic heart failure with pulmonary hypertension seen on echo. Again as above, we have encouraged her to limit her fluid intake and start a diuretic.

## 2010-11-04 ENCOUNTER — Telehealth: Payer: Self-pay | Admitting: *Deleted

## 2010-11-04 NOTE — Telephone Encounter (Signed)
Pt cannot take metolazone since she has any allergy to sulfonamide derivatives (rxn is tongue swelling), so she returned medication to pharmacy when she saw r/t sulfa. Can we give different diuretic? Please advise, I will call pt back today.

## 2010-11-04 NOTE — Telephone Encounter (Signed)
Per Dr. Mariah Milling, pt should be ok to take because she has had previous diuretics before (which also are r/t sulfa). Attempted to contact pt, LMOM TCB, and notified the above on her vm. Pt may call back with any questions.

## 2011-03-06 ENCOUNTER — Ambulatory Visit (INDEPENDENT_AMBULATORY_CARE_PROVIDER_SITE_OTHER): Payer: Medicare Other | Admitting: Cardiovascular Disease

## 2011-03-06 ENCOUNTER — Encounter: Payer: Self-pay | Admitting: Cardiovascular Disease

## 2011-03-06 DIAGNOSIS — E669 Obesity, unspecified: Secondary | ICD-10-CM

## 2011-03-06 DIAGNOSIS — I119 Hypertensive heart disease without heart failure: Secondary | ICD-10-CM

## 2011-03-06 DIAGNOSIS — I509 Heart failure, unspecified: Secondary | ICD-10-CM

## 2011-03-06 DIAGNOSIS — R0602 Shortness of breath: Secondary | ICD-10-CM

## 2011-03-06 DIAGNOSIS — I5032 Chronic diastolic (congestive) heart failure: Secondary | ICD-10-CM

## 2011-03-06 DIAGNOSIS — I4891 Unspecified atrial fibrillation: Secondary | ICD-10-CM

## 2011-03-06 DIAGNOSIS — R079 Chest pain, unspecified: Secondary | ICD-10-CM

## 2011-03-06 MED ORDER — FUROSEMIDE 20 MG PO TABS: 20.0000 mg | ORAL_TABLET | Freq: Two times a day (BID) | ORAL | Status: DC | PRN

## 2011-03-06 MED ORDER — ISOSORBIDE MONONITRATE ER 30 MG PO TB24: 30.0000 mg | ORAL_TABLET | Freq: Every day | ORAL | Status: DC

## 2011-03-06 NOTE — Assessment & Plan Note (Signed)
Continues to have signs of fluid overload. I believe this is causing her edema, abdominal fullness, chest tightness and shortness of breath.  We have suggested that she take metolazone 2.5 mg  in the morning, with Lasix 30 minutes later. If she does not have significant diuresis, she couldn't increase the Lasix to 40 mg in the morning, possibly even with metolazone 5 mg.

## 2011-03-06 NOTE — Assessment & Plan Note (Signed)
We will add imdur 30 mg daily. She felt better on NTG SL with less SOB.

## 2011-03-06 NOTE — Progress Notes (Signed)
Patient ID: Tracy Mann, female    DOB: 1933/08/17, 78 y.o.   MRN: [id number]  HPI Comments: Ms. Stapp is a 78 year old woman with a PMH significant for chronic atrial fibrillation on Coumadin therapy, diastolic dysfunction, hypertension, hyperlipidemia, obesity, and history of an  inferior wall myocardial infarction that was felt to be secondary to a thrombus from atrial fib. in October 2007 at [facility], previous episodes of chest pain who presents for followup.      echocardiogram has shown ejection fraction is mildly reduced. Moderately elevated right ventricular systolic pressures.  She continues to have shortness of breath, chronic lower extremity edema, a weight issue. She had tightness in her ABD.  She did refuse to followup with the stress test as previously ordered on a previous visit. She has also not done a sleep study. She has not been taking lasix on a regular basis. She was previously worried that lasix could cause hearing loss. He suggested she try metolazone.    She had a stress perfusion study in 2009 which showed a fixed mid apical inferior defect and a small area of anterior wall reversibility that was felt to be likely secondary to breast attenuation artifact.       Previous 48-hour Holter monitor, which showed that she was in persistent atrial fibrillation with one episode of rapid ventricular response.     EKG shows atrial fibrillation with rate of 89 beats per minute, poor R-wave progression through the precordial leads, nonspecific ST/T-wave changes     Outpatient Encounter Prescriptions as of 03/06/2011  Medication Sig Dispense Refill  . HYDROcodone-acetaminophen (NORCO) 10-325 MG per tablet Take 1 tablet by mouth as needed.        Marland Kitchen levothyroxine (SYNTHROID, LEVOTHROID) 75 MCG tablet Take 75 mcg by mouth daily.        Marland Kitchen lisinopril (PRINIVIL,ZESTRIL) 2.5 MG tablet Take 2.5 mg by mouth daily.        Marland Kitchen LORazepam (ATIVAN) 1 MG tablet Take 1 mg by  mouth 2 (two) times daily as needed.        . Magnesium 500 MG TABS Take 1 tablet by mouth daily.        . metolazone (ZAROXOLYN) 2.5 MG tablet Take 1 tablet (2.5 mg total) by mouth 2 (two) times daily as needed.  60 tablet  6  . metoprolol succinate (TOPROL-XL) 25 MG 24 hr tablet Take 25 mg by mouth daily.       . phenazopyridine (PYRIDIUM) 100 MG tablet Take 100 mg by mouth as needed.        . warfarin (COUMADIN) 2.5 MG tablet Take 2.5 mg by mouth daily.        Marland Kitchen ZETIA 10 MG tablet Take 1 tablet by mouth Daily.      Marland Kitchen zolpidem (AMBIEN) 10 MG tablet Take 10 mg by mouth daily.        .  furosemide (LASIX) 20 MG tablet Take 20 mg by mouth daily as needed.      . nitrofurantoin, macrocrystal-monohydrate, (MACROBID) 100 MG capsule Take 100 mg by mouth daily.        . nitroGLYCERIN (NITROSTAT) 0.4 MG SL tablet Place 1 tablet (0.4 mg total) under the tongue every 5 (five) minutes as needed. May repeat for up to 3 doses.  90 tablet  3     Review of Systems  Constitutional: Positive for fatigue.  HENT: Negative.   Eyes: Negative.   Respiratory: Positive for shortness of  breath.   Cardiovascular: Positive for leg swelling.  Gastrointestinal: Negative.   Musculoskeletal: Positive for joint swelling and gait problem.  Skin: Negative.   Neurological: Negative.   Hematological: Negative.   Psychiatric/Behavioral: Negative.   All other systems reviewed and are negative.    BP 145/91  Pulse 87  Ht 5' (1.524 m)  Wt 234 lb (106.142 kg)  BMI 45.70 kg/m2  SpO2 94%  Physical Exam  Nursing note and vitals reviewed. Constitutional: She is oriented to person, place, and time. She appears well-developed and well-nourished.       Obese.  HENT:  Head: Normocephalic.  Nose: Nose normal.  Mouth/Throat: Oropharynx is clear and moist.  Eyes: Conjunctivae are normal. Pupils are equal, round, and reactive to light.  Neck: Normal range of motion. Neck supple. No JVD present.  Cardiovascular: Normal  rate, S1 normal, S2 normal and intact distal pulses.  An irregularly irregular rhythm present. Exam reveals no gallop and no friction rub.   Murmur heard.  Crescendo systolic murmur is present with a grade of 2/6       Edema is nonpitting. She has varicies.  Pulmonary/Chest: Effort normal and breath sounds normal. No respiratory distress. She has no wheezes. She has no rales. She exhibits no tenderness.  Abdominal: Soft. Bowel sounds are normal. She exhibits no distension. There is no tenderness.  Musculoskeletal: Normal range of motion. She exhibits edema. She exhibits no tenderness.  Lymphadenopathy:    She has no cervical adenopathy.  Neurological: She is alert and oriented to person, place, and time. Coordination normal.  Skin: Skin is warm and dry. No rash noted. No erythema.  Psychiatric: She has a normal mood and affect. Her behavior is normal. Judgment and thought content normal.         Assessment and Plan

## 2011-03-06 NOTE — Assessment & Plan Note (Signed)
We have encouraged continued exercise, careful diet management in an effort to lose weight. 

## 2011-03-06 NOTE — Patient Instructions (Addendum)
If your edema or breathing gets worse, Take metolazone  in the Am (you can take two pills for a total of 5 mg) Thirty minutes later, take furosemide (ok to take two)  Start the long acting nitro pill (isosorbide) one a day for breathing  Please call us if you have new issues that need to be addressed before your next appt.  Your physician wants you to follow-up in: 3 months.  You will receive a reminder letter in the mail two months in advance. If you don't receive a letter, please call our office to schedule the follow-up appointment.

## 2011-03-06 NOTE — Assessment & Plan Note (Signed)
Likely multifactorial from fluid overload/diastolic dysfunction, obesity, deconditioning, atrial fibrillation.

## 2011-03-06 NOTE — Assessment & Plan Note (Signed)
We have suggested she restart metoprolol given her fluid retention. She will need to metoprolol for rate control wall she has atrial fibrillation.

## 2011-03-20 ENCOUNTER — Ambulatory Visit: Payer: Self-pay | Admitting: Family Medicine

## 2011-05-24 ENCOUNTER — Telehealth: Payer: Self-pay | Admitting: Cardiovascular Disease

## 2011-05-24 NOTE — Telephone Encounter (Signed)
Pt wants to come to our coumadin clinic instead of her PCP. Pt is seen by

## 2011-06-06 ENCOUNTER — Encounter: Payer: Self-pay | Admitting: Cardiovascular Disease

## 2011-06-06 ENCOUNTER — Ambulatory Visit (INDEPENDENT_AMBULATORY_CARE_PROVIDER_SITE_OTHER): Payer: Medicare Other | Admitting: Cardiovascular Disease

## 2011-06-06 VITALS — BP 115/78 | HR 81 | Ht 65.0 in | Wt 229.0 lb

## 2011-06-06 DIAGNOSIS — E119 Type 2 diabetes mellitus without complications: Secondary | ICD-10-CM

## 2011-06-06 DIAGNOSIS — I4891 Unspecified atrial fibrillation: Secondary | ICD-10-CM

## 2011-06-06 DIAGNOSIS — R079 Chest pain, unspecified: Secondary | ICD-10-CM

## 2011-06-06 DIAGNOSIS — I119 Hypertensive heart disease without heart failure: Secondary | ICD-10-CM

## 2011-06-06 DIAGNOSIS — R0602 Shortness of breath: Secondary | ICD-10-CM

## 2011-06-06 DIAGNOSIS — E669 Obesity, unspecified: Secondary | ICD-10-CM

## 2011-06-06 DIAGNOSIS — I509 Heart failure, unspecified: Secondary | ICD-10-CM

## 2011-06-06 DIAGNOSIS — I5032 Chronic diastolic (congestive) heart failure: Secondary | ICD-10-CM

## 2011-06-06 DIAGNOSIS — E785 Hyperlipidemia, unspecified: Secondary | ICD-10-CM

## 2011-06-06 NOTE — Assessment & Plan Note (Signed)
Weight is down 5 pounds and her edema appears improved. We have encouraged her to continue her current regimen. She takes her diuretics as she wants to and feels they cause hearing problems. We have suggested salt restriction.

## 2011-06-06 NOTE — Progress Notes (Signed)
Patient ID: Tracy Mann, female    DOB: Sep 13, 1933, 78 y.o.   MRN: [id number]  HPI Comments: Tracy Mann is a 78 year old woman with a PMH significant for chronic atrial fibrillation on Coumadin therapy, diastolic dysfunction, hypertension, hyperlipidemia, obesity, and history of an  inferior wall myocardial infarction that was felt to be secondary to a thrombus from atrial fib. in October 2007 at [facility], previous episodes of chest pain who presents for followup.      Previous  echocardiogram has shown ejection fraction is mildly reduced. Moderately elevated right ventricular systolic pressures. She has chronic shortness of breath, edema and weight issues. She reports that she was recently diagnosed with diabetes and her sugar levels have been more than 200 on a regular basis. She did not tolerate metformin and is currently not taking any medications. She does report having an elevated pulse rate in the morning, episodes of sweating. She has been changing her thyroid medication by herself as she feels it is too much. She has a sister who requires oxygen at nighttime and she wonders if she might have sleep apnea.   Her weight is down 5 pounds from her last clinic visit and she is taking her diuretic periodically. She feels that the diuretic both metolazone and Lasix caused hearing problems.    stress perfusion study in 2009 which showed a fixed mid apical inferior defect and a small area of anterior wall reversibility that was felt to be likely secondary to breast attenuation artifact.       Previous 48-hour Holter monitor, which showed that she was in persistent atrial fibrillation with one episode of rapid ventricular response.     EKG shows atrial fibrillation with rate of 81 beats per minute, poor R-wave progression through the precordial leads, nonspecific ST/T-wave changes, old inferior MI     Outpatient Encounter Prescriptions as of 06/06/2011  Medication Sig Dispense  Refill  . furosemide (LASIX) 20 MG tablet Take 1 tablet (20 mg total) by mouth 2 (two) times daily as needed.  60 tablet  6  . HYDROcodone-acetaminophen (NORCO) 10-325 MG per tablet Take 1 tablet by mouth as needed.        . isosorbide mononitrate (IMDUR) 30 MG 24 hr tablet Take 1 tablet (30 mg total) by mouth daily.  30 tablet  11  . levothyroxine (SYNTHROID, LEVOTHROID) 75 MCG tablet Take 75 mcg by mouth daily.        Marland Kitchen lisinopril (PRINIVIL,ZESTRIL) 2.5 MG tablet Take 2.5 mg by mouth daily.        Marland Kitchen LORazepam (ATIVAN) 1 MG tablet Take 1 mg by mouth 2 (two) times daily as needed.        . Magnesium 500 MG TABS Take 1 tablet by mouth daily.        . metolazone (ZAROXOLYN) 2.5 MG tablet Take 1 tablet (2.5 mg total) by mouth 2 (two) times daily as needed.  60 tablet  6  . metoprolol succinate (TOPROL-XL) 25 MG 24 hr tablet Take 25 mg by mouth daily. Taking 1/2 in the am and other 1/2 in the evening.      . nitrofurantoin, macrocrystal-monohydrate, (MACROBID) 100 MG capsule Take 100 mg by mouth daily.        . nitroGLYCERIN (NITROSTAT) 0.4 MG SL tablet Place 1 tablet (0.4 mg total) under the tongue every 5 (five) minutes as needed. May repeat for up to 3 doses.  90 tablet  3  . phenazopyridine (PYRIDIUM) 100  MG tablet Take 100 mg by mouth as needed.        . warfarin (COUMADIN) 2.5 MG tablet Take 4 mg by mouth daily.       Marland Kitchen ZETIA 10 MG tablet Take 1 tablet by mouth Daily.      Marland Kitchen zolpidem (AMBIEN) 10 MG tablet Take 10 mg by mouth daily.        Review of Systems  Constitutional: Positive for fatigue.  HENT: Negative.   Eyes: Negative.   Respiratory: Positive for shortness of breath.   Cardiovascular: Positive for palpitations.  Gastrointestinal: Negative.   Musculoskeletal: Positive for joint swelling and gait problem.  Skin: Negative.   Neurological: Negative.   Hematological: Negative.   Psychiatric/Behavioral: Negative.   All other systems reviewed and are negative.    BP 115/78   Pulse 81  Ht 5\' 5"  (1.651 m)  Wt 229 lb (103.874 kg)  BMI 38.11 kg/m2  Physical Exam  Nursing note and vitals reviewed. Constitutional: She is oriented to person, place, and time. She appears well-developed and well-nourished.       Obese.  HENT:  Head: Normocephalic.  Nose: Nose normal.  Mouth/Throat: Oropharynx is clear and moist.  Eyes: Conjunctivae are normal. Pupils are equal, round, and reactive to light.  Neck: Normal range of motion. Neck supple. No JVD present.  Cardiovascular: Normal rate, S1 normal, S2 normal and intact distal pulses.  An irregularly irregular rhythm present. Exam reveals no gallop and no friction rub.   Murmur heard.  Crescendo systolic murmur is present with a grade of 2/6       Edema is nonpitting. She has varicies.  Pulmonary/Chest: Effort normal and breath sounds normal. No respiratory distress. She has no wheezes. She has no rales. She exhibits no tenderness.  Abdominal: Soft. Bowel sounds are normal. She exhibits no distension. There is no tenderness.  Musculoskeletal: Normal range of motion. She exhibits edema. She exhibits no tenderness.  Lymphadenopathy:    She has no cervical adenopathy.  Neurological: She is alert and oriented to person, place, and time. Coordination normal.  Skin: Skin is warm and dry. No rash noted. No erythema.  Psychiatric: She has a normal mood and affect. Her behavior is normal. Judgment and thought content normal.         Assessment and Plan

## 2011-06-06 NOTE — Assessment & Plan Note (Signed)
Chronic atrial fibrillation, on anticoagulation, adequate rate control. She does report tachycardia in the morning and we have suggested she could try a full metoprolol pill at nighttime.

## 2011-06-06 NOTE — Assessment & Plan Note (Signed)
She has been unable to tolerate statins in the past. She takes zetia.

## 2011-06-06 NOTE — Assessment & Plan Note (Signed)
We have encouraged continued exercise, careful diet management in an effort to lose weight. 

## 2011-06-06 NOTE — Assessment & Plan Note (Signed)
Blood pressure is well controlled on today's visit. No changes made to the medications though we did suggest she could increase her metoprolol at nighttime for rate control in the morning.

## 2011-06-06 NOTE — Patient Instructions (Signed)
You are doing well. Please continue metoprolol 1/2 in the Am and consider a full pill in the PM Contact Dr. Sullivan Lone and let him know the sugars are elevated   Please call us if you have new issues that need to be addressed before your next appt.  Your physician wants you to follow-up in: 3 months.

## 2011-06-06 NOTE — Assessment & Plan Note (Signed)
We have suggested she followup with Dr. Sullivan Lone for management of her diabetes. We have recommended strict diet control.

## 2011-06-20 ENCOUNTER — Ambulatory Visit: Payer: Self-pay | Admitting: Family Medicine

## 2011-06-21 ENCOUNTER — Telehealth: Payer: Self-pay | Admitting: Cardiovascular Disease

## 2011-06-21 NOTE — Telephone Encounter (Signed)
Pt reports worsening LE edema in feet/ankles.  Denies worsening sob or abd. Distension.  Also concerned about dx of "diastolic heart failure" on AVS.  Tells me feet are also "shiny and red".  Takes lasix 40 mg daily. Has not taken metolazone. Only takes as directed.  Went to PCP yesterday and was told she had some wheezes and CXR was ordered. Was started on abx.  PCP to check PT/INR next week.  Patient is concerned about symptoms. Would like to be seen. Scheduled with Dr. Mariah Milling tomm at 1000.

## 2011-06-21 NOTE — Telephone Encounter (Signed)
Discussed symptoms with Dr. Mariah Milling. He advises to have pt. Resume metolazone QD until weight back to baseline.  Restrict sodium. No appt needed.  I will call pt. to advise.

## 2011-06-21 NOTE — Telephone Encounter (Signed)
Pt called back. I explained she does not need appt for tomm. I also gave her instructions, per Dr. Mariah Milling. She verb. Understanding.  She wanted to clarification re: her dx on AVS "diastolic heart failure" and why she cannot have atrial fib ablation if this is contributing to her symptoms. I explained her co morbidities may be too great and it may be something he has already considered. i urged her to discuss with him at next OV in 2 months.  She will do this.

## 2011-06-21 NOTE — Telephone Encounter (Signed)
lmtcb

## 2011-06-21 NOTE — Telephone Encounter (Signed)
Pt calling states that she has had a lot of swelling in stomach, feet, lower legs, red, and some pitting. Pt also concerned about Chronic Dystolic heart failure. Pt having SOB.

## 2011-06-22 ENCOUNTER — Ambulatory Visit: Payer: Medicare Other | Admitting: Cardiovascular Disease

## 2011-08-07 ENCOUNTER — Ambulatory Visit: Payer: Medicare Other | Admitting: Cardiovascular Disease

## 2011-08-10 ENCOUNTER — Ambulatory Visit (INDEPENDENT_AMBULATORY_CARE_PROVIDER_SITE_OTHER): Payer: Medicare Other | Admitting: Cardiovascular Disease

## 2011-08-10 ENCOUNTER — Encounter: Payer: Self-pay | Admitting: Cardiovascular Disease

## 2011-08-10 VITALS — BP 110/72 | HR 77 | Ht 65.0 in | Wt 224.5 lb

## 2011-08-10 DIAGNOSIS — I5032 Chronic diastolic (congestive) heart failure: Secondary | ICD-10-CM

## 2011-08-10 DIAGNOSIS — I4891 Unspecified atrial fibrillation: Secondary | ICD-10-CM

## 2011-08-10 DIAGNOSIS — R0602 Shortness of breath: Secondary | ICD-10-CM

## 2011-08-10 DIAGNOSIS — I2510 Atherosclerotic heart disease of native coronary artery without angina pectoris: Secondary | ICD-10-CM

## 2011-08-10 DIAGNOSIS — E785 Hyperlipidemia, unspecified: Secondary | ICD-10-CM

## 2011-08-10 NOTE — Assessment & Plan Note (Signed)
We have suggested she stay on her zetia 10 mg daily. We will add fenofibrate daily for elevated triglycerides

## 2011-08-10 NOTE — Patient Instructions (Addendum)
You are doing well. Please try Trilipix/fenofibrate one a day  Monitor your weight If you have 5 lbs weight decrease, call our office for blood work to check kidneys  Please call us if you have new issues that need to be addressed before your next appt.  Your physician wants you to follow-up in: 6 months.  You will receive a reminder letter in the mail two months in advance. If you don't receive a letter, please call our office to schedule the follow-up appointment.

## 2011-08-10 NOTE — Assessment & Plan Note (Signed)
Improved symptoms with no weight loss over the past 6 months. We have suggested she continue on her diuretic with goal several additional pound loss. If she has more than 5 pounds decreased from today, we have suggested she call us for lab work.

## 2011-08-10 NOTE — Assessment & Plan Note (Signed)
10 pound weight loss in the past 6 months with Lasix. Significant improvement in symptoms. We have asked her to watch her weight and keep her weight at its current level, possibly slightly below by several pounds.

## 2011-08-10 NOTE — Assessment & Plan Note (Signed)
Currently with no symptoms of angina. No further workup at this time. Continue current medication regimen. 

## 2011-08-10 NOTE — Progress Notes (Signed)
Patient ID: Tracy Mann, female    DOB: 1933/11/11, 78 y.o.   MRN: [id number]  HPI Comments: Tracy Mann is a 78 year old woman with a PMH significant for chronic atrial fibrillation on Coumadin therapy, diastolic dysfunction, hypertension, hyperlipidemia, obesity, and history of an  inferior wall myocardial infarction that was felt to be secondary to a thrombus from atrial fib. in October 2007 at [facility], previous episodes of chest pain who presents for followup.  Recent diagnosis of diabetes    Previous  echocardiogram has shown ejection fraction is mildly reduced. Moderately elevated right ventricular systolic pressures. She has chronic shortness of breath, edema and weight issues.  Since February, she has lost 10 pounds. She lost 5 pounds from February to April with a low-dose diuretic, additional 5 pounds from April to today.  She reports that her breathing has significantly improved, edema has improved. Her energy is better, overall she feels well. She was not taking Lasix initially secondary to hearing problems. She takes the Lasix at nighttime to avoid any tinnitus or hearing issues.    stress perfusion study in 2009 which showed a fixed mid apical inferior defect and a small area of anterior wall reversibility that was felt to be likely secondary to breast attenuation artifact.       Previous 48-hour Holter monitor, which showed that she was in persistent atrial fibrillation with one episode of rapid ventricular response.     EKG shows atrial fibrillation with rate of 77 beats per minute, poor R-wave progression through the precordial leads, nonspecific ST/T-wave changes, LAFB  Recent lab work shows total cholesterol 198, triglycerides 300, LDL 104      Outpatient Encounter Prescriptions as of 08/10/2011  Medication Sig Dispense Refill  . furosemide (LASIX) 20 MG tablet Take 20 mg by mouth daily as needed.      Marland Kitchen HYDROcodone-acetaminophen (NORCO) 10-325 MG per  tablet Take 1 tablet by mouth as needed.        . Levothyroxine Sodium 88 MCG CAPS Take 88 mcg by mouth daily.      Marland Kitchen lisinopril (PRINIVIL,ZESTRIL) 2.5 MG tablet Take 2.5 mg by mouth daily.        Marland Kitchen LORazepam (ATIVAN) 1 MG tablet Take 1 mg by mouth 2 (two) times daily as needed.        . metolazone (ZAROXOLYN) 2.5 MG tablet Take 2.5 mg by mouth daily as needed.      . metoprolol succinate (TOPROL-XL) 25 MG 24 hr tablet Taking 1/2 in the am and one full tablet in the evening.      . nitrofurantoin, macrocrystal-monohydrate, (MACROBID) 100 MG capsule Take 100 mg by mouth daily.        . nitroGLYCERIN (NITROSTAT) 0.4 MG SL tablet Place 1 tablet (0.4 mg total) under the tongue every 5 (five) minutes as needed. May repeat for up to 3 doses.  90 tablet  3  . phenazopyridine (PYRIDIUM) 100 MG tablet Take 100 mg by mouth as needed.        . vitamin E 1000 UNIT capsule Take two tablets daily.      Marland Kitchen warfarin (COUMADIN) 4 MG tablet Take 4 mg by mouth daily.      Marland Kitchen ZETIA 10 MG tablet Take 1 tablet by mouth Daily.      Marland Kitchen zolpidem (AMBIEN) 10 MG tablet Take 10 mg by mouth daily.         Review of Systems  HENT: Negative.   Eyes: Negative.  Gastrointestinal: Negative.   Musculoskeletal: Positive for joint swelling and gait problem.  Skin: Negative.   Neurological: Negative.   Hematological: Negative.   Psychiatric/Behavioral: Negative.   All other systems reviewed and are negative.   BP 110/72  Pulse 77  Ht 5\' 5"  (1.651 m)  Wt 224 lb 8 oz (101.833 kg)  BMI 37.36 kg/m2  Physical Exam  Nursing note and vitals reviewed. Constitutional: She is oriented to person, place, and time. She appears well-developed and well-nourished.       Obese.  HENT:  Head: Normocephalic.  Nose: Nose normal.  Mouth/Throat: Oropharynx is clear and moist.  Eyes: Conjunctivae are normal. Pupils are equal, round, and reactive to light.  Neck: Normal range of motion. Neck supple. No JVD present.  Cardiovascular:  Normal rate, S1 normal, S2 normal and intact distal pulses.  An irregularly irregular rhythm present. Exam reveals no gallop and no friction rub.   Murmur heard.  Crescendo systolic murmur is present with a grade of 2/6       Edema is nonpitting. She has varicies.  Pulmonary/Chest: Effort normal and breath sounds normal. No respiratory distress. She has no wheezes. She has no rales. She exhibits no tenderness.  Abdominal: Soft. Bowel sounds are normal. She exhibits no distension. There is no tenderness.  Musculoskeletal: Normal range of motion. She exhibits edema. She exhibits no tenderness.  Lymphadenopathy:    She has no cervical adenopathy.  Neurological: She is alert and oriented to person, place, and time. Coordination normal.  Skin: Skin is warm and dry. No rash noted. No erythema.  Psychiatric: She has a normal mood and affect. Her behavior is normal. Judgment and thought content normal.         Assessment and Plan

## 2011-10-10 ENCOUNTER — Telehealth: Payer: Self-pay | Admitting: Cardiovascular Disease

## 2011-10-10 NOTE — Telephone Encounter (Signed)
Pt says she was started on komig (sp?) XR, a new combination diabetic med (metformin and saxaglipin) 2 weeks ago. She recently increased dose from 5900 mg to 1000 mg yesterday. She says she was out shopping yesterday and became very sob, weak in legs, abdominal pressure and h/a. She was able to sit down and felt better. These symptoms worse off throughout day.  She then took another dose of same med this am as prescribed and is feeling same way.  She is concerned about her heart. I advised her to call PCP now to let them know her symptoms since increasing dose. I explained we are not familiar with this med and should be handled by PCP. She verb. Understanding and is also concerned about BP. I advised ok to come in tomm for EKG/BP check to assess rhythm. In the meantime she should call PCP now to see about appt re: abdominal fullness and SE. Understanding verb.  She denies active CP or sob. She will let us know if PCP has any other suggestions for [place].

## 2011-10-10 NOTE — Telephone Encounter (Signed)
Pt called stating that she thinks she is having a reaction to her diabetes medication. Pt states that it is causing her to be SOB, have pains in her abdomen, and feel dizzy. I explained to pt that she needed to call her PCP and let them know that we didn't prescribe her diabetes meds. Told pt that I would let nurse her know.

## 2011-10-11 ENCOUNTER — Telehealth: Payer: Self-pay

## 2011-10-11 NOTE — Telephone Encounter (Signed)
Message copied by Marcelle Overlie on Wed Oct 11, 2011  8:01 AM ------      Message from: Thersa Salt      Created: Tue Oct 10, 2011  3:26 PM      Regarding: RE: tomm schedule       Pt will call in the am states that she called PCP and thinks that symptoms were from diabetes meds      ----- Message -----         From: Marcelle Overlie, RN         Sent: 10/10/2011  11:44 AM           To: Angelina Sheriff Little      Subject: tomm schedule                                            Can you please create a slot for pt for EKG/BP tomm 10/11/11 at 1030? Thanks

## 2011-10-13 ENCOUNTER — Telehealth: Payer: Self-pay

## 2011-10-13 NOTE — Telephone Encounter (Signed)
Pt called with c/o "abdominal distension and fullness".  She says she called Dr. Sullivan Lone this week as we suggested re: SE of new med.  He switched her to Onglyza.  Since starting this med she has been having a feeling of abdominal fullness.  She says she has to "sit up to breathe". She denies peripheral edema. She also mentons having an episode of CP this am, relieved with SL NTG x 1.  She states, "I'm not sure it was my heart. It could have been indigestion".  She has not contacted Dr. Elisabeth Cara office to tell them about SE she is having since starting Onglyza.   I advised her to call them NOW, before w/e to let them know SE she is having since starting Onglyza. I have also suggested she try taking an extra 1/2 dose lasix x 2 days to see if this helps abdominal distention over w/e.  She should also continue to monitor CP over w/e and take SL NTG PRN CP/go to ER should this go unrelieved.  She verb. Understanding and will call us Monday to update. I explained importance of speaking with Dr. Elisabeth Cara office TODAY. Understanding verb.

## 2011-10-18 ENCOUNTER — Ambulatory Visit: Payer: Self-pay | Admitting: Family Medicine

## 2011-10-25 ENCOUNTER — Other Ambulatory Visit: Payer: Self-pay

## 2011-10-25 MED ORDER — METOPROLOL SUCCINATE ER 25 MG PO TB24: 25.0000 mg | ORAL_TABLET | ORAL | Status: DC

## 2011-10-31 DIAGNOSIS — R339 Retention of urine, unspecified: Secondary | ICD-10-CM | POA: Insufficient documentation

## 2011-10-31 DIAGNOSIS — N200 Calculus of kidney: Secondary | ICD-10-CM | POA: Insufficient documentation

## 2011-10-31 DIAGNOSIS — N3020 Other chronic cystitis without hematuria: Secondary | ICD-10-CM | POA: Insufficient documentation

## 2011-11-01 ENCOUNTER — Ambulatory Visit: Payer: Self-pay | Admitting: Family Medicine

## 2011-12-21 ENCOUNTER — Other Ambulatory Visit: Payer: Self-pay | Admitting: Cardiovascular Disease

## 2011-12-21 NOTE — Telephone Encounter (Signed)
Refilled [employer].

## 2011-12-29 ENCOUNTER — Telehealth: Payer: Self-pay | Admitting: Cardiovascular Disease

## 2011-12-29 ENCOUNTER — Other Ambulatory Visit: Payer: Self-pay | Admitting: Cardiovascular Disease

## 2011-12-29 NOTE — Telephone Encounter (Signed)
New message:  Needs prescription for Lisiniprol called into [facility] in [place]/ [phone]

## 2012-01-01 ENCOUNTER — Other Ambulatory Visit: Payer: Self-pay

## 2012-01-01 MED ORDER — LISINOPRIL 2.5 MG PO TABS: 2.5000 mg | ORAL_TABLET | Freq: Every day | ORAL | Status: DC

## 2012-01-01 NOTE — Telephone Encounter (Signed)
Refill sent for lisinopril  

## 2012-01-25 ENCOUNTER — Other Ambulatory Visit: Payer: Self-pay | Admitting: *Deleted

## 2012-01-25 MED ORDER — LISINOPRIL 2.5 MG PO TABS: 2.5000 mg | ORAL_TABLET | Freq: Every day | ORAL | Status: DC

## 2012-01-25 NOTE — Telephone Encounter (Signed)
Refilled Lisinopril. Sent to [facility].

## 2012-02-06 ENCOUNTER — Encounter: Payer: Self-pay | Admitting: Cardiovascular Disease

## 2012-02-06 ENCOUNTER — Ambulatory Visit (INDEPENDENT_AMBULATORY_CARE_PROVIDER_SITE_OTHER): Payer: Medicare Other | Admitting: Cardiovascular Disease

## 2012-02-06 VITALS — BP 112/78 | HR 85 | Ht 65.0 in | Wt 226.5 lb

## 2012-02-06 DIAGNOSIS — E669 Obesity, unspecified: Secondary | ICD-10-CM

## 2012-02-06 DIAGNOSIS — E119 Type 2 diabetes mellitus without complications: Secondary | ICD-10-CM

## 2012-02-06 DIAGNOSIS — R079 Chest pain, unspecified: Secondary | ICD-10-CM

## 2012-02-06 DIAGNOSIS — I4891 Unspecified atrial fibrillation: Secondary | ICD-10-CM

## 2012-02-06 DIAGNOSIS — R0602 Shortness of breath: Secondary | ICD-10-CM

## 2012-02-06 DIAGNOSIS — I2510 Atherosclerotic heart disease of native coronary artery without angina pectoris: Secondary | ICD-10-CM

## 2012-02-06 DIAGNOSIS — I5032 Chronic diastolic (congestive) heart failure: Secondary | ICD-10-CM

## 2012-02-06 NOTE — Assessment & Plan Note (Signed)
Chronic atrial fibrillation, rate relatively well controlled. On warfarin.

## 2012-02-06 NOTE — Assessment & Plan Note (Signed)
Currently with no symptoms of angina. No further workup at this time. Continue current medication regimen. 

## 2012-02-06 NOTE — Assessment & Plan Note (Signed)
She is taking Lasix daily, watching her fluid intake. Clinically, appears to be doing relatively well.

## 2012-02-06 NOTE — Assessment & Plan Note (Signed)
On insulin, unable to tolerate oral medications. Followed by Dr. Renae Fickle.

## 2012-02-06 NOTE — Progress Notes (Signed)
Patient ID: Tracy Mann, female    DOB: 1933/12/08, 79 y.o.   MRN: [id number]  HPI Comments: Ms. Glick is a 79 year old woman with a PMH significant for chronic atrial fibrillation on Coumadin therapy, diastolic dysfunction, hypertension, hyperlipidemia, obesity, and history of an  inferior wall myocardial infarction that was felt to be secondary to a thrombus from atrial fib. in October 2007 at [facility], previous episodes of chest pain who presents for followup.  Recent diagnosis of diabetes, currently on insulin and she was unable to tolerate oral medications. Followed by Dr. Thomes Cake.  She reports that she has no energy, feels useless. She's not doing much in the daytime. She is relatively sedentary. She does not go out much and does not participate in regular exercise program. She feels the insulin is causing fluid retention. She is taking Lasix now on a daily basis. She has chronic shortness of breath, edema and weight issues.   Previous  echocardiogram has shown ejection fraction is mildly reduced. Moderately elevated right ventricular systolic pressures.    stress perfusion study in 2009 which showed a fixed mid apical inferior defect and a small area of anterior wall reversibility that was felt to be likely secondary to breast attenuation artifact.       Previous 48-hour Holter monitor, which showed that she was in persistent atrial fibrillation with one episode of rapid ventricular response.     EKG shows atrial fibrillation with rate of 85 beats per minute, poor R-wave progression through the precordial leads, nonspecific ST/T-wave changes, LAFB  total cholesterol 198, triglycerides 300, LDL 104      Outpatient Encounter Prescriptions as of 02/06/2012  Medication Sig Dispense Refill  . furosemide (LASIX) 20 MG tablet Take 20 mg by mouth daily as needed.      Marland Kitchen HYDROcodone-acetaminophen (NORCO) 10-325 MG per tablet Take 1 tablet by mouth as needed.        .  insulin glargine (LANTUS) 100 UNIT/ML injection Inject 22 Units into the skin at bedtime.      . Levothyroxine Sodium 88 MCG CAPS Take 88 mcg by mouth daily.      Marland Kitchen lisinopril (PRINIVIL,ZESTRIL) 2.5 MG tablet Take 1 tablet (2.5 mg total) by mouth daily.  90 tablet  3  . LORazepam (ATIVAN) 1 MG tablet Take 1 mg by mouth 2 (two) times daily as needed.        . metolazone (ZAROXOLYN) 2.5 MG tablet TAKE 1 TABLET BY MOUTH TWICE A DAY AS NEEDED  60 tablet  3  . metoprolol succinate (TOPROL-XL) 25 MG 24 hr tablet TAKE 1/2 TABLET EVERY AM AND 1 TAB AT EVENING  60 tablet  6  . nitrofurantoin, macrocrystal-monohydrate, (MACROBID) 100 MG capsule Take 100 mg by mouth daily.        . nitroGLYCERIN (NITROSTAT) 0.4 MG SL tablet Place 1 tablet (0.4 mg total) under the tongue every 5 (five) minutes as needed. May repeat for up to 3 doses.  90 tablet  3  . phenazopyridine (PYRIDIUM) 100 MG tablet Take 100 mg by mouth as needed.        . vitamin E 1000 UNIT capsule Take two tablets daily.      Marland Kitchen warfarin (COUMADIN) 3 MG tablet Take 3 mg by mouth daily.      Marland Kitchen warfarin (COUMADIN) 4 MG tablet Take 4 mg by mouth daily. As directed      . ZETIA 10 MG tablet Take 1 tablet by mouth  Daily.      . zolpidem (AMBIEN) 10 MG tablet Take 10 mg by mouth daily.       . fenofibrate (TRICOR) 145 MG tablet Take 1 tablet (145 mg total) by mouth daily.        Review of Systems  Constitutional: Positive for activity change and fatigue.  HENT: Negative.   Eyes: Negative.   Respiratory: Negative.   Cardiovascular: Negative.   Gastrointestinal: Negative.   Musculoskeletal: Positive for joint swelling and gait problem.  Skin: Negative.   Neurological: Negative.   Hematological: Negative.   Psychiatric/Behavioral: Negative.   All other systems reviewed and are negative.   BP 112/78  Pulse 85  Ht 5\' 5"  (1.651 m)  Wt 226 lb 8 oz (102.74 kg)  BMI 37.69 kg/m2  Physical Exam  Nursing note and vitals reviewed. Constitutional:  She is oriented to person, place, and time. She appears well-developed and well-nourished.       Obese.  HENT:  Head: Normocephalic.  Nose: Nose normal.  Mouth/Throat: Oropharynx is clear and moist.  Eyes: Conjunctivae normal are normal. Pupils are equal, round, and reactive to light.  Neck: Normal range of motion. Neck supple. No JVD present.  Cardiovascular: Normal rate, S1 normal, S2 normal and intact distal pulses.  An irregularly irregular rhythm present. Exam reveals no gallop and no friction rub.   Murmur heard.  Crescendo systolic murmur is present with a grade of 2/6       Edema is nonpitting. She has varicies.  Pulmonary/Chest: Effort normal and breath sounds normal. No respiratory distress. She has no wheezes. She has no rales. She exhibits no tenderness.  Abdominal: Soft. Bowel sounds are normal. She exhibits no distension. There is no tenderness.  Musculoskeletal: Normal range of motion. She exhibits edema. She exhibits no tenderness.  Lymphadenopathy:    She has no cervical adenopathy.  Neurological: She is alert and oriented to person, place, and time. Coordination normal.  Skin: Skin is warm and dry. No rash noted. No erythema.  Psychiatric: She has a normal mood and affect. Her behavior is normal. Judgment and thought content normal.         Assessment and Plan

## 2012-02-06 NOTE — Assessment & Plan Note (Signed)
We have encouraged continued exercise, careful diet management in an effort to lose weight. 

## 2012-02-06 NOTE — Patient Instructions (Addendum)
You are doing well. No medication changes were made.  Please call us if you have new issues that need to be addressed before your next appt.  Your physician wants you to follow-up in: 6 months.  You will receive a reminder letter in the mail two months in advance. If you don't receive a letter, please call our office to schedule the follow-up appointment.   

## 2012-03-26 ENCOUNTER — Ambulatory Visit: Payer: Self-pay | Admitting: Family Medicine

## 2012-04-03 ENCOUNTER — Other Ambulatory Visit: Payer: Self-pay | Admitting: *Deleted

## 2012-04-03 MED ORDER — METOPROLOL SUCCINATE ER 25 MG PO TB24: ORAL_TABLET | ORAL | Status: DC

## 2012-04-03 NOTE — Telephone Encounter (Signed)
Refilled Metorprolol succ. #135 Refill#3 Sent to [facility].

## 2012-07-12 ENCOUNTER — Telehealth: Payer: Self-pay

## 2012-07-12 NOTE — Telephone Encounter (Signed)
Pt called with concerns [id number] she has been having x several days Says she has been having periods of severe diaphoresis with minimal exertion This is associated with CP and DOE Says "it feels like I am trying to cut through a thick fog" Symptoms are worse when walking up an incline Says she is having chest pressure at this moment but no diaphoresis occassionally has nausea  Says these symptoms are very similar to prior to last MI She has not tried NTG for symptoms  Says she is not sweating at the moment; has "turned the air up" and is lying on couch; says she is ok as long as she does not exert herself. Is having chest pressure right now  I advised, since she is having active chest pressure, to go to ER.  I made her an appt with Dr. Mariah Milling 6/17 I instructed her to try NTG, if she does not want to go to ER now, SL now for the chest pressure and if no relief, she should call 911 Understanding verb  Otherwise she will keep appt with [place] 6/17

## 2012-07-16 ENCOUNTER — Ambulatory Visit (INDEPENDENT_AMBULATORY_CARE_PROVIDER_SITE_OTHER): Payer: Medicare Other | Admitting: Cardiovascular Disease

## 2012-07-16 ENCOUNTER — Encounter: Payer: Self-pay | Admitting: Cardiovascular Disease

## 2012-07-16 VITALS — BP 120/72 | HR 71 | Ht 65.0 in | Wt 237.2 lb

## 2012-07-16 DIAGNOSIS — R609 Edema, unspecified: Secondary | ICD-10-CM

## 2012-07-16 DIAGNOSIS — E119 Type 2 diabetes mellitus without complications: Secondary | ICD-10-CM

## 2012-07-16 DIAGNOSIS — I2510 Atherosclerotic heart disease of native coronary artery without angina pectoris: Secondary | ICD-10-CM

## 2012-07-16 DIAGNOSIS — R0789 Other chest pain: Secondary | ICD-10-CM

## 2012-07-16 DIAGNOSIS — I5032 Chronic diastolic (congestive) heart failure: Secondary | ICD-10-CM

## 2012-07-16 DIAGNOSIS — E785 Hyperlipidemia, unspecified: Secondary | ICD-10-CM

## 2012-07-16 DIAGNOSIS — I119 Hypertensive heart disease without heart failure: Secondary | ICD-10-CM

## 2012-07-16 DIAGNOSIS — R0602 Shortness of breath: Secondary | ICD-10-CM

## 2012-07-16 DIAGNOSIS — I4891 Unspecified atrial fibrillation: Secondary | ICD-10-CM

## 2012-07-16 NOTE — Patient Instructions (Addendum)
You are doing well. No medication changes were made.  Call if you would like an echocardiogram  Please call us if you have new issues that need to be addressed before your next appt.  Your physician wants you to follow-up in: 6 months.  You will receive a reminder letter in the mail two months in advance. If you don't receive a letter, please call our office to schedule the follow-up appointment.

## 2012-07-16 NOTE — Assessment & Plan Note (Signed)
Currently not on a statin. Only on zetia. She has had a difficult time tolerating statins in the past. We'll address with her again in future followup

## 2012-07-16 NOTE — Progress Notes (Signed)
Patient ID: Tracy Mann, female    DOB: 06-Dec-1933, 79 y.o.   MRN: [id number]  HPI Comments: Tracy Mann is a 79 year old woman with a PMH significant for chronic atrial fibrillation on Coumadin therapy, diastolic dysfunction, hypertension, hyperlipidemia, obesity, and history of an  inferior wall myocardial infarction that was felt to be secondary to a thrombus from atrial fib. in October 2007 at [facility], previous episodes of chest pain who presents for followup. diabetes, currently on insulin. Followed by Dr. Thomes Cake. Prior echocardiogram showing moderate pulmonary hypertension. Now she takes Lasix  On today's visit, she reports that she had difficulty in the heat recently. She was trying to fix her garden and decorate a room in her house. She was unable to maintain a reasonable level of activity and was always getting very sweaty. She required significant air conditioning when in the car, felt better in the house where it was cool. She has since kept her self in a house, and the air conditioning at 70 doing minimal activity and has felt better.  She is taking Lasix several days per week.  She is very sedentary at baseline. Weight continues to be an issue  chronic shortness of breath, edema and weight issues.   Previous  echocardiogram has shown ejection fraction is mildly reduced. Moderately elevated right ventricular systolic pressures. stress perfusion study in 2009 which showed a fixed mid apical inferior defect and a small area of anterior wall reversibility that was felt to be likely secondary to breast attenuation artifact.  Previous 48-hour Holter monitor, which showed that she was in persistent atrial fibrillation with one episode of rapid ventricular response.     EKG shows atrial fibrillation with rate of 71 beats per minute, poor R-wave progression through the precordial leads, nonspecific ST/T-wave changes, LAFB  total cholesterol 198, triglycerides 300, LDL  104      Outpatient Encounter Prescriptions as of 07/16/2012  Medication Sig Dispense Refill  . furosemide (LASIX) 20 MG tablet Take 20 mg by mouth daily as needed.      Marland Kitchen HYDROcodone-acetaminophen (NORCO) 10-325 MG per tablet Take 1 tablet by mouth as needed.        . insulin glargine (LANTUS) 100 UNIT/ML injection Inject 22 Units into the skin at bedtime.      . Levothyroxine Sodium 88 MCG CAPS Take 88 mcg by mouth daily.      Marland Kitchen lisinopril (PRINIVIL,ZESTRIL) 2.5 MG tablet Take 1 tablet (2.5 mg total) by mouth daily.  90 tablet  3  . LORazepam (ATIVAN) 1 MG tablet Take 1 mg by mouth 2 (two) times daily as needed.        . metolazone (ZAROXOLYN) 2.5 MG tablet TAKE 1 TABLET BY MOUTH TWICE A DAY AS NEEDED  60 tablet  3  . metoprolol succinate (TOPROL-XL) 25 MG 24 hr tablet TAKE 1/2 TABLET EVERY AM AND 1 TAB AT EVENING  135 tablet  3  . nitrofurantoin, macrocrystal-monohydrate, (MACROBID) 100 MG capsule Take 100 mg by mouth as needed.       . nitroGLYCERIN (NITROSTAT) 0.4 MG SL tablet Place 1 tablet (0.4 mg total) under the tongue every 5 (five) minutes as needed. May repeat for up to 3 doses.  90 tablet  3  . phenazopyridine (PYRIDIUM) 100 MG tablet Take 100 mg by mouth as needed.        . vitamin E 1000 UNIT capsule Take two tablets daily.      Marland Kitchen warfarin (COUMADIN)  3 MG tablet Take 3 mg by mouth daily.      Marland Kitchen warfarin (COUMADIN) 4 MG tablet Take 4 mg by mouth daily. As directed      . ZETIA 10 MG tablet Take 1 tablet by mouth Daily.      Marland Kitchen zolpidem (AMBIEN) 10 MG tablet Take 10 mg by mouth daily.       . [DISCONTINUED] fenofibrate (TRICOR) 145 MG tablet Take 1 tablet (145 mg total) by mouth daily.       No facility-administered encounter medications on file as of 07/16/2012.    Review of Systems  Constitutional: Positive for fatigue.       Sweaty with exertion  HENT: Negative.   Eyes: Negative.   Respiratory: Negative.   Cardiovascular: Negative.   Gastrointestinal: Negative.    Musculoskeletal: Positive for joint swelling and gait problem.  Skin: Negative.   Neurological: Negative.   Psychiatric/Behavioral: Negative.   All other systems reviewed and are negative.   BP 120/72  Pulse 71  Ht 5\' 5"  (1.651 m)  Wt 237 lb 4 oz (107.616 kg)  BMI 39.48 kg/m2  Physical Exam  Nursing note and vitals reviewed. Constitutional: She is oriented to person, place, and time. She appears well-developed and well-nourished.  Obese.  HENT:  Head: Normocephalic.  Nose: Nose normal.  Mouth/Throat: Oropharynx is clear and moist.  Eyes: Conjunctivae are normal. Pupils are equal, round, and reactive to light.  Neck: Normal range of motion. Neck supple. No JVD present.  Cardiovascular: Normal rate, S1 normal, S2 normal and intact distal pulses.  An irregularly irregular rhythm present. Exam reveals no gallop and no friction rub.   Murmur heard.  Crescendo systolic murmur is present with a grade of 2/6  Edema is nonpitting. She has varicies.  Pulmonary/Chest: Effort normal and breath sounds normal. No respiratory distress. She has no wheezes. She has no rales. She exhibits no tenderness.  Abdominal: Soft. Bowel sounds are normal. She exhibits no distension. There is no tenderness.  Musculoskeletal: Normal range of motion. She exhibits no edema and no tenderness.  Lymphadenopathy:    She has no cervical adenopathy.  Neurological: She is alert and oriented to person, place, and time. Coordination normal.  Skin: Skin is warm and dry. No rash noted. No erythema.  Psychiatric: She has a normal mood and affect. Her behavior is normal. Judgment and thought content normal.    Assessment and Plan

## 2012-07-16 NOTE — Assessment & Plan Note (Signed)
Currently with no symptoms of angina. No further workup at this time. Continue current medication regimen. If she starts developing diaphoresis again with minimal exertion, we may need to consider cardiac catheterization. She states having similar symptoms prior to previous stent placement.

## 2012-07-16 NOTE — Assessment & Plan Note (Signed)
History of moderate pulmonary hypertension. We have recommended she stay on her Lasix

## 2012-07-16 NOTE — Assessment & Plan Note (Signed)
We have encouraged continued exercise, careful diet management in an effort to lose weight. Managed by Dr. Renae Fickle.

## 2012-07-16 NOTE — Assessment & Plan Note (Signed)
Heart rate well controlled. No changes to her medications

## 2012-07-16 NOTE — Assessment & Plan Note (Signed)
Blood pressure is well controlled on today's visit. No changes made to the medications. 

## 2012-08-05 ENCOUNTER — Ambulatory Visit: Payer: Medicare Other | Admitting: Cardiovascular Disease

## 2012-10-24 ENCOUNTER — Other Ambulatory Visit: Payer: Self-pay | Admitting: *Deleted

## 2012-10-24 MED ORDER — METOLAZONE 2.5 MG PO TABS: ORAL_TABLET | ORAL | Status: DC

## 2012-10-24 NOTE — Telephone Encounter (Signed)
Refilled Metolazone #180 R#[facility].

## 2012-11-18 ENCOUNTER — Other Ambulatory Visit: Payer: Self-pay

## 2012-11-18 MED ORDER — LISINOPRIL 2.5 MG PO TABS: 2.5000 mg | ORAL_TABLET | Freq: Every day | ORAL | Status: DC

## 2012-11-18 NOTE — Telephone Encounter (Signed)
Refill sent for lisinopril 2.5 mg take one tablet daily.

## 2012-11-22 ENCOUNTER — Telehealth: Payer: Self-pay | Admitting: *Deleted

## 2012-11-22 DIAGNOSIS — R0602 Shortness of breath: Secondary | ICD-10-CM

## 2012-11-22 NOTE — Telephone Encounter (Signed)
Patient is very swollen feeling. Around ankles and abdomen. Took 2 fluid pill last night around 11pm. She is also sob and very low energy.

## 2012-11-22 NOTE — Telephone Encounter (Signed)
Echo and lab work scheduled for November 26, 2012 at 4:15. Pt aware

## 2012-11-22 NOTE — Telephone Encounter (Signed)
Spoke with pt. She reports increased fatigue and shortness of breath for last week.  Occurs with minimal exertion.  Doing things in the house makes her tired and short of breath. Weight today is 237.5. Does not weigh daily. Previous weight about a week ago was 239. States she has swelling in lower legs and feet---sandals leave an impression on her feet. . Feels bloated in abdominal area. States blood sugar has been running high lately also. States she has been taking metolazone and lasix every other day for last week.  Has not noticed increased urine output.  No fever but has stuffy nose and slightly productive cough. Cough is clear and occasionally slightly green tinged. Will review with Dr. Mariah Milling

## 2012-11-22 NOTE — Telephone Encounter (Signed)
Reviewed with Dr. Mariah Milling and pt should take Lasix daily and continue metolazone every other day. She needs echo, BMP and BNP.  I spoke with pt and gave her this information. I told her I would have scheduling call her with echo/lab appt time.  She is aware to go to ED if symptoms worsen.  She has also contacted her endocrinologist about elevated blood sugars.

## 2012-11-26 ENCOUNTER — Ambulatory Visit (INDEPENDENT_AMBULATORY_CARE_PROVIDER_SITE_OTHER): Payer: Medicare Other | Admitting: *Deleted

## 2012-11-26 ENCOUNTER — Other Ambulatory Visit: Payer: Self-pay

## 2012-11-26 ENCOUNTER — Other Ambulatory Visit (INDEPENDENT_AMBULATORY_CARE_PROVIDER_SITE_OTHER): Payer: Medicare Other

## 2012-11-26 DIAGNOSIS — R0789 Other chest pain: Secondary | ICD-10-CM

## 2012-11-26 DIAGNOSIS — R609 Edema, unspecified: Secondary | ICD-10-CM

## 2012-11-26 DIAGNOSIS — I059 Rheumatic mitral valve disease, unspecified: Secondary | ICD-10-CM

## 2012-11-26 DIAGNOSIS — R079 Chest pain, unspecified: Secondary | ICD-10-CM

## 2012-11-26 DIAGNOSIS — R0602 Shortness of breath: Secondary | ICD-10-CM

## 2012-11-26 MED ORDER — FUROSEMIDE 20 MG PO TABS: 20.0000 mg | ORAL_TABLET | Freq: Two times a day (BID) | ORAL | Status: DC | PRN

## 2012-11-26 NOTE — Telephone Encounter (Signed)
Refill sent for furosemide 20 mg 

## 2012-11-28 LAB — BASIC METABOLIC PANEL
BUN: 20 mg/dL (ref 8–27)
CO2: 28 mmol/L (ref 18–29)
Chloride: 97 mmol/L (ref 97–108)
Glucose: 141 mg/dL — ABNORMAL HIGH (ref 65–99)

## 2012-11-28 LAB — BRAIN NATRIURETIC PEPTIDE: BNP: 73.6 pg/mL (ref 0.0–100.0)

## 2012-11-29 ENCOUNTER — Telehealth: Payer: Self-pay

## 2012-11-29 NOTE — Telephone Encounter (Signed)
Spoke w/ pt.  She is aware of results.   Pt is interested in proceeding with the cath and will come in on Monday at 2:00 to discuss with Dr. Mariah Milling and get pre-procedure labs for possible cath on 12/05/12.

## 2012-11-29 NOTE — Telephone Encounter (Signed)
Message copied by Marilynne Halsted on Fri Nov 29, 2012 10:34 AM ------      Message from: Antonieta Iba      Created: Fri Nov 29, 2012  9:43 AM       Ms. Boone's echo has changed from 2012      Ejection fraction has dropped from normal, now heart function is moderately depressed      Etiology is unclear      Given recent symptoms concerning for angina, should consider cardiac catheterization      At the very least a stress test.            She could come in for appointment to discuss ------

## 2012-11-30 HISTORY — PX: CARDIAC CATHETERIZATION: SHX172

## 2012-12-02 ENCOUNTER — Encounter: Payer: Self-pay | Admitting: Cardiovascular Disease

## 2012-12-02 ENCOUNTER — Ambulatory Visit (INDEPENDENT_AMBULATORY_CARE_PROVIDER_SITE_OTHER): Payer: Medicare Other | Admitting: Cardiovascular Disease

## 2012-12-02 ENCOUNTER — Ambulatory Visit: Payer: Self-pay | Admitting: Cardiovascular Disease

## 2012-12-02 VITALS — BP 110/72 | HR 74 | Ht 65.0 in | Wt 236.5 lb

## 2012-12-02 DIAGNOSIS — E785 Hyperlipidemia, unspecified: Secondary | ICD-10-CM

## 2012-12-02 DIAGNOSIS — I119 Hypertensive heart disease without heart failure: Secondary | ICD-10-CM

## 2012-12-02 DIAGNOSIS — E119 Type 2 diabetes mellitus without complications: Secondary | ICD-10-CM

## 2012-12-02 DIAGNOSIS — E669 Obesity, unspecified: Secondary | ICD-10-CM

## 2012-12-02 DIAGNOSIS — R079 Chest pain, unspecified: Secondary | ICD-10-CM

## 2012-12-02 DIAGNOSIS — I4891 Unspecified atrial fibrillation: Secondary | ICD-10-CM

## 2012-12-02 DIAGNOSIS — I5032 Chronic diastolic (congestive) heart failure: Secondary | ICD-10-CM

## 2012-12-02 DIAGNOSIS — R0602 Shortness of breath: Secondary | ICD-10-CM

## 2012-12-02 DIAGNOSIS — I2510 Atherosclerotic heart disease of native coronary artery without angina pectoris: Secondary | ICD-10-CM

## 2012-12-02 MED ORDER — PHYTONADIONE 5 MG PO TABS: 10.0000 mg | ORAL_TABLET | Freq: Once | ORAL | Status: DC

## 2012-12-02 MED ORDER — ENOXAPARIN SODIUM 40 MG/0.4ML ~~LOC~~ SOLN: 80.0000 mg | Freq: Two times a day (BID) | SUBCUTANEOUS | Status: DC

## 2012-12-02 NOTE — Patient Instructions (Addendum)
  Stop warfarin today Take vitamin K today, 10 mg x 1 Please take lovenox twice a day Wednesday Am and PM, NONE on Thursday, restart on Friday, Saturday, Sunday and Monday Restart warfarin on Thursday night  Cardiac cath scheduled for Thursday AM  We will do a Xray and labs today  Please call us if you have new issues that need to be addressed before your next appt.  Your physician wants you to follow-up in: 1 month.   [facility] Cardiac Cath Instructions   You are scheduled for a Cardiac Cath on:_______Thursday, Nov 6__________________  Please arrive at __7:30_____am on the day of your procedure  You will need to pre-register prior to the day of your procedure.  Enter through the [facility] [facility] at [facility]  Registration is the first desk on your right.  Please take the procedure order we have given you in order to be registered appropriately  Do not eat/drink anything after midnight  Someone will need to drive you home  It is recommended someone be with you for the first 24 hours after your procedure  Wear clothes that are easy to get on/off and wear slip on shoes if possible   Medications bring a current list of all medications with you  __X_ Do not take these medications the night before or morning of your procedure:___lasix, lisinopril, metolazone_________________________________________________________   Follow directions for coumadin and lovenox  Take 1/2 your morning insulin dose am of procedure.  Day of your procedure: Arrive at the [facility] entrance.  Free valet service is available.  After entering [facility] please check-in at the registration desk (1st desk on your right) to receive your armband. After receiving your armband someone will escort you to the cardiac cath/special procedures waiting area.  The usual length of stay after your procedure is about 2 to 3 hours.  This can vary.  If you have any questions, please call our office at [phone], or you may  call the cardiac cath lab at [facility] directly at [phone]

## 2012-12-02 NOTE — Assessment & Plan Note (Signed)
Encouraged her to watch her diet   

## 2012-12-02 NOTE — Assessment & Plan Note (Signed)
Encouraged her to stay on her diuretic regimen. We'll hold the diuretics prior to the catheterization

## 2012-12-02 NOTE — Progress Notes (Signed)
Patient ID: Tracy Mann, female    DOB: 06/26/1933, 79 y.o.   MRN: [id number]  HPI Comments: Tracy Mann is a 79 year old woman with a PMH significant for chronic atrial fibrillation on Coumadin therapy, diastolic dysfunction, hypertension, hyperlipidemia, obesity, and history of an  inferior wall myocardial infarction that was felt to be secondary to a thrombus from atrial fib. in October 2007 at [facility], previous episodes of chest pain who presents for followup. diabetes, currently on insulin. Followed by Dr. Thomes Mann. Prior echocardiogram showing moderate pulmonary hypertension. Now she takes Lasix  Last clinic visit, she had worsening shortness of breath, poor energy. Echocardiogram was done that showed moderately depressed ejection fraction of 35%. Previously she had been 50%. She continues to feel poorly with shortness of breath and fatigue with any exertion.   She continues to take Lasix several days per week her. She denies any significant leg edema She is very sedentary at baseline. Weight continues to be an issue   Previous  echocardiogram several years ago showed ejection fraction 50%. Moderately elevated right ventricular systolic pressures. stress perfusion study in 2009 which showed a fixed mid apical inferior defect and a small area of anterior wall reversibility that was felt to be likely secondary to breast attenuation artifact.  Previous 48-hour Holter monitor, which showed that she was in persistent atrial fibrillation with one episode of rapid ventricular response.     EKG shows atrial fibrillation with rate of 74 beats per minute, poor R-wave progression through the precordial leads, consider old anterior MI , nonspecific ST/T-wave changes, LAFB  total cholesterol 198, triglycerides 300, LDL 104      Outpatient Encounter Prescriptions as of 12/02/2012  Medication Sig  . furosemide (LASIX) 20 MG tablet Take 1 tablet (20 mg total) by mouth 2 (two) times  daily as needed.  Marland Kitchen HYDROcodone-acetaminophen (NORCO) 10-325 MG per tablet Take 1 tablet by mouth as needed.    . insulin glargine (LANTUS) 100 UNIT/ML injection Inject 22 Units into the skin at bedtime.  . Levothyroxine Sodium 88 MCG CAPS Take 88 mcg by mouth daily.  Marland Kitchen lisinopril (PRINIVIL,ZESTRIL) 2.5 MG tablet Take 1 tablet (2.5 mg total) by mouth daily.  Marland Kitchen LORazepam (ATIVAN) 1 MG tablet Take 1 mg by mouth 2 (two) times daily as needed.    . metolazone (ZAROXOLYN) 2.5 MG tablet TAKE 1 TABLET BY MOUTH TWICE A DAY AS NEEDED  . metoprolol succinate (TOPROL-XL) 25 MG 24 hr tablet TAKE 1/2 TABLET EVERY AM AND 1 TAB AT EVENING  . nitrofurantoin, macrocrystal-monohydrate, (MACROBID) 100 MG capsule Take 100 mg by mouth as needed.   . nitroGLYCERIN (NITROSTAT) 0.4 MG SL tablet Place 1 tablet (0.4 mg total) under the tongue every 5 (five) minutes as needed. May repeat for up to 3 doses.  . phenazopyridine (PYRIDIUM) 100 MG tablet Take 100 mg by mouth as needed.    . vitamin E 1000 UNIT capsule Take two tablets daily.  Marland Kitchen warfarin (COUMADIN) 3 MG tablet Take 3 mg by mouth daily.  Marland Kitchen warfarin (COUMADIN) 4 MG tablet Take 4 mg by mouth daily. As directed  . ZETIA 10 MG tablet Take 1 tablet by mouth Daily.  Marland Kitchen zolpidem (AMBIEN) 10 MG tablet Take 10 mg by mouth daily.     Review of Systems  Constitutional: Positive for fatigue.       Sweaty with exertion  HENT: Negative.   Eyes: Negative.   Respiratory: Positive for shortness of breath.  Cardiovascular: Negative.   Gastrointestinal: Negative.   Musculoskeletal: Positive for gait problem and joint swelling.  Skin: Negative.   Neurological: Negative.   Psychiatric/Behavioral: Negative.   All other systems reviewed and are negative.   BP 110/72  Pulse 74  Ht 5\' 5"  (1.651 m)  Wt 236 lb 8 oz (107.276 kg)  BMI 39.36 kg/m2  Physical Exam  Nursing note and vitals reviewed. Constitutional: She is oriented to person, place, and time. She appears  well-developed and well-nourished.  Obese.  HENT:  Head: Normocephalic.  Nose: Nose normal.  Mouth/Throat: Oropharynx is clear and moist.  Eyes: Conjunctivae are normal. Pupils are equal, round, and reactive to light.  Neck: Normal range of motion. Neck supple. No JVD present.  Cardiovascular: Normal rate, S1 normal, S2 normal and intact distal pulses.  An irregularly irregular rhythm present. Exam reveals no gallop and no friction rub.   Murmur heard.  Crescendo systolic murmur is present with a grade of 2/6  Edema is nonpitting. She has varicies.  Pulmonary/Chest: Effort normal and breath sounds normal. No respiratory distress. She has no wheezes. She has no rales. She exhibits no tenderness.  Abdominal: Soft. Bowel sounds are normal. She exhibits no distension. There is no tenderness.  Musculoskeletal: Normal range of motion. She exhibits no edema and no tenderness.  Lymphadenopathy:    She has no cervical adenopathy.  Neurological: She is alert and oriented to person, place, and time. Coordination normal.  Skin: Skin is warm and dry. No rash noted. No erythema.  Psychiatric: She has a normal mood and affect. Her behavior is normal. Judgment and thought content normal.    Assessment and Plan

## 2012-12-02 NOTE — Assessment & Plan Note (Signed)
We have encouraged continued exercise, careful diet management in an effort to lose weight. 

## 2012-12-02 NOTE — Assessment & Plan Note (Signed)
Etiology of her shortness of breath now with depressed ejection fraction is concerning for underlying ischemia. We have discussed the various treatment options with her. Given her obesity, previous artifact on Myoview, inability to walk/exercise, she prefers a cardiac catheterization. This will be scheduled later this week.

## 2012-12-02 NOTE — Assessment & Plan Note (Signed)
Blood pressure is well controlled on today's visit. No changes made to the medications. 

## 2012-12-02 NOTE — Assessment & Plan Note (Signed)
She has had a difficult time tolerating statins

## 2012-12-02 NOTE — Assessment & Plan Note (Signed)
In preparation for cardiac catheterization, we will hold her warfarin. She is concerned about stroke risk while holding warfarin and we will start Lovenox twice a day prior to and after the procedure.

## 2012-12-02 NOTE — Assessment & Plan Note (Signed)
New drop in her ejection fraction concerning for ischemia. Cardiac catheterization planned. She has hyperlipidemia and diabetes.

## 2012-12-03 ENCOUNTER — Other Ambulatory Visit: Payer: Self-pay

## 2012-12-03 DIAGNOSIS — R079 Chest pain, unspecified: Secondary | ICD-10-CM

## 2012-12-03 LAB — CBC WITH DIFFERENTIAL/PLATELET
Basophils Absolute: 0 10*3/uL (ref 0.0–0.2)
Basos: 0 %
Eos: 2 %
Eosinophils Absolute: 0.2 10*3/uL (ref 0.0–0.4)
HCT: 39.4 % (ref 34.0–46.6)
Immature Grans (Abs): 0 10*3/uL (ref 0.0–0.1)
Immature Granulocytes: 0 %
Lymphs: 36 %
MCHC: 34.3 g/dL (ref 31.5–35.7)
MCV: 87 fL (ref 79–97)
Monocytes Absolute: 0.6 10*3/uL (ref 0.1–0.9)
Monocytes: 6 %
Neutrophils Relative %: 56 %
RBC: 4.53 x10E6/uL (ref 3.77–5.28)
RDW: 13.2 % (ref 12.3–15.4)
WBC: 8.9 10*3/uL (ref 3.4–10.8)

## 2012-12-03 LAB — BASIC METABOLIC PANEL
BUN: 26 mg/dL (ref 8–27)
CO2: 23 mmol/L (ref 18–29)
Calcium: 9.7 mg/dL (ref 8.6–10.2)
Creatinine, Ser: 0.71 mg/dL (ref 0.57–1.00)
GFR calc non Af Amer: 82 mL/min/{1.73_m2} (ref >59)
Glucose: 203 mg/dL — ABNORMAL HIGH (ref 65–99)
Potassium: 4.2 mmol/L (ref 3.5–5.2)

## 2012-12-03 LAB — PROTIME-INR
INR: 2.1 — ABNORMAL HIGH (ref 0.8–1.2)
Prothrombin Time: 21.2 s — ABNORMAL HIGH (ref 9.1–12.0)

## 2012-12-04 ENCOUNTER — Other Ambulatory Visit: Payer: Self-pay

## 2012-12-04 ENCOUNTER — Ambulatory Visit: Payer: Self-pay | Admitting: Cardiovascular Disease

## 2012-12-04 DIAGNOSIS — I4891 Unspecified atrial fibrillation: Secondary | ICD-10-CM

## 2012-12-05 ENCOUNTER — Ambulatory Visit: Payer: Self-pay | Admitting: Cardiovascular Disease

## 2012-12-05 ENCOUNTER — Encounter: Payer: Self-pay | Admitting: Cardiovascular Disease

## 2012-12-05 DIAGNOSIS — R0602 Shortness of breath: Secondary | ICD-10-CM

## 2012-12-06 ENCOUNTER — Encounter: Payer: Self-pay | Admitting: *Deleted

## 2012-12-12 ENCOUNTER — Ambulatory Visit (INDEPENDENT_AMBULATORY_CARE_PROVIDER_SITE_OTHER): Payer: Medicare Other | Admitting: Cardiovascular Disease

## 2012-12-12 ENCOUNTER — Encounter: Payer: Self-pay | Admitting: Cardiovascular Disease

## 2012-12-12 VITALS — BP 122/70 | HR 78 | Ht 65.0 in | Wt 241.8 lb

## 2012-12-12 DIAGNOSIS — I2510 Atherosclerotic heart disease of native coronary artery without angina pectoris: Secondary | ICD-10-CM

## 2012-12-12 DIAGNOSIS — I5032 Chronic diastolic (congestive) heart failure: Secondary | ICD-10-CM

## 2012-12-12 DIAGNOSIS — E669 Obesity, unspecified: Secondary | ICD-10-CM

## 2012-12-12 DIAGNOSIS — E785 Hyperlipidemia, unspecified: Secondary | ICD-10-CM

## 2012-12-12 DIAGNOSIS — I4891 Unspecified atrial fibrillation: Secondary | ICD-10-CM

## 2012-12-12 DIAGNOSIS — R0602 Shortness of breath: Secondary | ICD-10-CM

## 2012-12-12 MED ORDER — METOLAZONE 5 MG PO TABS: ORAL_TABLET | ORAL | Status: DC

## 2012-12-12 NOTE — Assessment & Plan Note (Signed)
She does not want Lasix as she is afraid it is causing hearing loss. She would prefer metolazone daily

## 2012-12-12 NOTE — Progress Notes (Signed)
Patient ID: Tracy Mann, female    DOB: 10-Aug-1933, 79 y.o.   MRN: [id number]  HPI Comments: Tracy Mann is a 79 year old woman with a PMH significant for chronic atrial fibrillation on Coumadin therapy, diastolic dysfunction, hypertension, hyperlipidemia, morbid obesity, and history of an  inferior wall myocardial infarction that was felt to be secondary to a thrombus from atrial fib. in October 2007 at [facility], previous episodes of chest pain who presents for followup. diabetes, currently on insulin. Followed by Dr. Thomes Cake. Prior echocardiogram showing moderate pulmonary hypertension. Now she takes diuretics  On a previous visit, she had worsening shortness of breath, poor energy. Echocardiogram was done that showed moderately depressed ejection fraction of 35%. Previously she had been 50%. She felt poorly  with shortness of breath and fatigue with any exertion.   Cardiac catheterization was performed to rule out ischemia as a cause of her depressed ejection fraction. This was done on 12/05/2012. She had a right dominant coronary arterial system with mild luminal irregularities, no significant stenoses that could contribute to her depressed ejection fraction. EF was estimated at 45%. There was an aneurysmal/severely hypokinetic region in the distal inferior and apical inferior wall from old MI 2007.   Previous  echocardiogram several years ago showed ejection fraction 50%. Moderately elevated right ventricular systolic pressures. Previous 48-hour Holter monitor, which showed that she was in persistent atrial fibrillation with one episode of rapid ventricular response.     EKG shows atrial fibrillation with rate of 78 beats per minute, poor R-wave progression through the precordial leads, consider old anterior MI , nonspecific ST/T-wave changes, LAFB  total cholesterol 198, triglycerides 300, LDL 104      Outpatient Encounter Prescriptions as of 12/12/2012  Medication  Sig  . enoxaparin (LOVENOX) 40 MG/0.4ML injection Inject 0.8 mLs (80 mg total) into the skin 2 (two) times daily.  . furosemide (LASIX) 20 MG tablet Take 1 tablet (20 mg total) by mouth 2 (two) times daily as needed.  Marland Kitchen HYDROcodone-acetaminophen (NORCO) 10-325 MG per tablet Take 1 tablet by mouth as needed.    . insulin glargine (LANTUS) 100 UNIT/ML injection Inject 22 Units into the skin at bedtime.  . Levothyroxine Sodium 88 MCG CAPS Take 88 mcg by mouth daily.  Marland Kitchen lisinopril (PRINIVIL,ZESTRIL) 2.5 MG tablet Take 1 tablet (2.5 mg total) by mouth daily.  Marland Kitchen LORazepam (ATIVAN) 1 MG tablet Take 1 mg by mouth 2 (two) times daily as needed.    . metolazone (ZAROXOLYN) 2.5 MG tablet TAKE 1 TABLET BY MOUTH TWICE A DAY AS NEEDED  . metoprolol succinate (TOPROL-XL) 25 MG 24 hr tablet TAKE 1/2 TABLET EVERY AM AND 1 TAB AT EVENING  . nitrofurantoin, macrocrystal-monohydrate, (MACROBID) 100 MG capsule Take 100 mg by mouth as needed.   . nitroGLYCERIN (NITROSTAT) 0.4 MG SL tablet Place 1 tablet (0.4 mg total) under the tongue every 5 (five) minutes as needed. May repeat for up to 3 doses.  . phenazopyridine (PYRIDIUM) 100 MG tablet Take 100 mg by mouth as needed.    . phytonadione (VITAMIN K) 5 MG tablet Take 2 tablets (10 mg total) by mouth once.  . vitamin E 1000 UNIT capsule Take two tablets daily.  Marland Kitchen warfarin (COUMADIN) 3 MG tablet Take 3 mg by mouth daily.  Marland Kitchen warfarin (COUMADIN) 4 MG tablet Take 4 mg by mouth daily. As directed  . ZETIA 10 MG tablet Take 1 tablet by mouth Daily.  Marland Kitchen zolpidem (AMBIEN) 10 MG  tablet Take 10 mg by mouth daily.     Review of Systems  Constitutional: Positive for fatigue.       Sweaty with exertion  HENT: Negative.   Eyes: Negative.   Respiratory: Positive for shortness of breath.   Cardiovascular: Negative.   Gastrointestinal: Negative.   Musculoskeletal: Positive for gait problem and joint swelling.  Skin: Negative.   Neurological: Negative.    Psychiatric/Behavioral: Negative.   All other systems reviewed and are negative.   BP 122/70  Pulse 78  Ht 5\' 5"  (1.651 m)  Wt 241 lb 12 oz (109.657 kg)  BMI 40.23 kg/m2  Physical Exam  Nursing note and vitals reviewed. Constitutional: She is oriented to person, place, and time. She appears well-developed and well-nourished.  Obese.  HENT:  Head: Normocephalic.  Nose: Nose normal.  Mouth/Throat: Oropharynx is clear and moist.  Eyes: Conjunctivae are normal. Pupils are equal, round, and reactive to light.  Neck: Normal range of motion. Neck supple. No JVD present.  Cardiovascular: Normal rate, S1 normal, S2 normal and intact distal pulses.  An irregularly irregular rhythm present. Exam reveals no gallop and no friction rub.   Murmur heard.  Crescendo systolic murmur is present with a grade of 2/6  Edema is nonpitting. She has varicies.  Pulmonary/Chest: Effort normal and breath sounds normal. No respiratory distress. She has no wheezes. She has no rales. She exhibits no tenderness.  Abdominal: Soft. Bowel sounds are normal. She exhibits no distension. There is no tenderness.  Musculoskeletal: Normal range of motion. She exhibits no edema and no tenderness.  Lymphadenopathy:    She has no cervical adenopathy.  Neurological: She is alert and oriented to person, place, and time. Coordination normal.  Skin: Skin is warm and dry. No rash noted. No erythema.  Psychiatric: She has a normal mood and affect. Her behavior is normal. Judgment and thought content normal.    Assessment and Plan

## 2012-12-12 NOTE — Assessment & Plan Note (Signed)
I suspect her shortness of breath is multifactorial. Ejection fraction is mildly depressed, history of pulmonary hypertension, underlying obesity and deconditioning. We have offered cardiac rehabilitation at [facility]

## 2012-12-12 NOTE — Assessment & Plan Note (Signed)
Chronic atrial fibrillation. Rate is reasonably well controlled. Tolerating warfarin. This is likely contributing to underlying chronic diastolic CHF

## 2012-12-12 NOTE — Assessment & Plan Note (Signed)
Unable to tolerate statins in the past. She is tolerating zetia daily. Cholesterol is running high. Minimal CAD 

## 2012-12-12 NOTE — Assessment & Plan Note (Addendum)
We have encouraged continued exercise, careful diet management in an effort to lose weight. I suspect this is a significant part of her breathing and deconditioning issues.

## 2012-12-12 NOTE — Assessment & Plan Note (Signed)
Minimal CAD seen on recent cardiac catheterization.

## 2012-12-12 NOTE — Patient Instructions (Signed)
You are doing well. No medication changes were made.  Please take metolazone as needed for for shortness of breath or leg swelling  Please call us if you have new issues that need to be addressed before your next appt.  Your physician wants you to follow-up in: 6 months.  You will receive a reminder letter in the mail two months in advance. If you don't receive a letter, please call our office to schedule the follow-up appointment.

## 2013-01-01 ENCOUNTER — Ambulatory Visit: Payer: Medicare Other | Admitting: Cardiovascular Disease

## 2013-01-13 ENCOUNTER — Ambulatory Visit: Payer: Medicare Other | Admitting: Cardiovascular Disease

## 2013-05-06 ENCOUNTER — Inpatient Hospital Stay: Payer: Self-pay | Admitting: Internal Medicine

## 2013-05-06 LAB — COMPREHENSIVE METABOLIC PANEL
ALK PHOS: 99 U/L
ANION GAP: 2 — AB (ref 7–16)
Albumin: 3.5 g/dL (ref 3.4–5.0)
BUN: 22 mg/dL — AB (ref 7–18)
Bilirubin,Total: 0.7 mg/dL (ref 0.2–1.0)
CREATININE: 0.78 mg/dL (ref 0.60–1.30)
Calcium, Total: 8.9 mg/dL (ref 8.5–10.1)
Chloride: 99 mmol/L (ref 98–107)
Co2: 33 mmol/L — ABNORMAL HIGH (ref 21–32)
EGFR (African American): >60
EGFR (Non-African Amer.): >60
Glucose: 127 mg/dL — ABNORMAL HIGH (ref 65–99)
Osmolality: 273 (ref 275–301)
Potassium: 3.8 mmol/L (ref 3.5–5.1)
SGOT(AST): 25 U/L (ref 15–37)
SGPT (ALT): 20 U/L (ref 12–78)
Sodium: 134 mmol/L — ABNORMAL LOW (ref 136–145)
TOTAL PROTEIN: 8.1 g/dL (ref 6.4–8.2)

## 2013-05-06 LAB — CBC WITH DIFFERENTIAL/PLATELET
BASOS ABS: 0.1 10*3/uL (ref 0.0–0.1)
BASOS PCT: 0.7 %
Eosinophil #: 0.2 10*3/uL (ref 0.0–0.7)
Eosinophil %: 1.4 %
HCT: 41.7 % (ref 35.0–47.0)
HGB: 13.7 g/dL (ref 12.0–16.0)
Lymphocyte #: 3.8 10*3/uL — ABNORMAL HIGH (ref 1.0–3.6)
Lymphocyte %: 29.2 %
MCH: 29.6 pg (ref 26.0–34.0)
MCHC: 32.9 g/dL (ref 32.0–36.0)
MCV: 90 fL (ref 80–100)
Monocyte #: 1 x10 3/mm — ABNORMAL HIGH (ref 0.2–0.9)
Monocyte %: 7.5 %
NEUTROS PCT: 61.2 %
Neutrophil #: 7.9 10*3/uL — ABNORMAL HIGH (ref 1.4–6.5)
Platelet: 231 10*3/uL (ref 150–440)
RBC: 4.64 10*6/uL (ref 3.80–5.20)
RDW: 13.5 % (ref 11.5–14.5)
WBC: 12.9 10*3/uL — ABNORMAL HIGH (ref 3.6–11.0)

## 2013-05-06 LAB — MAGNESIUM: Magnesium: 1.9 mg/dL

## 2013-05-06 LAB — PROTIME-INR
INR: 2.2
Prothrombin Time: 23.8 secs — ABNORMAL HIGH (ref 11.5–14.7)

## 2013-05-06 LAB — TROPONIN I: Troponin-I: <0.02 ng/mL

## 2013-05-06 LAB — PRO B NATRIURETIC PEPTIDE: B-Type Natriuretic Peptide: 611 pg/mL — ABNORMAL HIGH (ref 0–450)

## 2013-05-07 LAB — BASIC METABOLIC PANEL
ANION GAP: 4 — AB (ref 7–16)
BUN: 19 mg/dL — ABNORMAL HIGH (ref 7–18)
CO2: 32 mmol/L (ref 21–32)
Calcium, Total: 8.5 mg/dL (ref 8.5–10.1)
Chloride: 99 mmol/L (ref 98–107)
Creatinine: 0.77 mg/dL (ref 0.60–1.30)
EGFR (African American): >60
Glucose: 156 mg/dL — ABNORMAL HIGH (ref 65–99)
Osmolality: 276 (ref 275–301)
POTASSIUM: 4 mmol/L (ref 3.5–5.1)
SODIUM: 135 mmol/L — AB (ref 136–145)

## 2013-05-07 LAB — SYNOVIAL CELL COUNT + DIFF, W/ CRYSTALS
BASOS ABS: 0 %
Crystals, Joint Fluid: NONE SEEN
EOS PCT: 1 %
Lymphocytes: 25 %
Neutrophils: 39 %
Nucleated Cell Count: 1426 /mm3
OTHER CELLS BF: 0 %
OTHER MONONUCLEAR CELLS: 35 %

## 2013-05-07 LAB — CBC WITH DIFFERENTIAL/PLATELET
BASOS PCT: 0.6 %
Basophil #: 0.1 10*3/uL (ref 0.0–0.1)
EOS ABS: 0.2 10*3/uL (ref 0.0–0.7)
Eosinophil %: 1.7 %
HCT: 38.6 % (ref 35.0–47.0)
HGB: 13 g/dL (ref 12.0–16.0)
LYMPHS ABS: 3.1 10*3/uL (ref 1.0–3.6)
LYMPHS PCT: 29.7 %
MCH: 30.4 pg (ref 26.0–34.0)
MCHC: 33.6 g/dL (ref 32.0–36.0)
MCV: 90 fL (ref 80–100)
MONO ABS: 0.8 x10 3/mm (ref 0.2–0.9)
Monocyte %: 7.5 %
NEUTROS ABS: 6.4 10*3/uL (ref 1.4–6.5)
Neutrophil %: 60.5 %
PLATELETS: 213 10*3/uL (ref 150–440)
RBC: 4.27 10*6/uL (ref 3.80–5.20)
RDW: 13.6 % (ref 11.5–14.5)
WBC: 10.5 10*3/uL (ref 3.6–11.0)

## 2013-05-08 LAB — PROTIME-INR
INR: 1.7
Prothrombin Time: 19.9 secs — ABNORMAL HIGH (ref 11.5–14.7)

## 2013-05-11 LAB — BODY FLUID CULTURE

## 2013-05-12 LAB — CULTURE, BLOOD (SINGLE)

## 2013-06-09 ENCOUNTER — Ambulatory Visit: Payer: Self-pay | Admitting: Specialist

## 2013-08-11 ENCOUNTER — Other Ambulatory Visit: Payer: Self-pay | Admitting: *Deleted

## 2013-08-11 MED ORDER — LISINOPRIL 2.5 MG PO TABS: 2.5000 mg | ORAL_TABLET | Freq: Every day | ORAL | Status: DC

## 2013-08-26 ENCOUNTER — Ambulatory Visit: Payer: Medicare Other | Admitting: Cardiovascular Disease

## 2013-08-27 ENCOUNTER — Ambulatory Visit: Payer: Medicare Other | Admitting: Cardiovascular Disease

## 2013-08-28 ENCOUNTER — Ambulatory Visit: Payer: Medicare Other | Admitting: Cardiovascular Disease

## 2013-08-29 ENCOUNTER — Encounter: Payer: Self-pay | Admitting: Cardiovascular Disease

## 2013-08-29 ENCOUNTER — Ambulatory Visit (INDEPENDENT_AMBULATORY_CARE_PROVIDER_SITE_OTHER): Payer: Medicare Other | Admitting: Cardiovascular Disease

## 2013-08-29 VITALS — BP 120/68 | HR 99 | Ht 65.0 in | Wt 231.5 lb

## 2013-08-29 DIAGNOSIS — I2510 Atherosclerotic heart disease of native coronary artery without angina pectoris: Secondary | ICD-10-CM

## 2013-08-29 DIAGNOSIS — E785 Hyperlipidemia, unspecified: Secondary | ICD-10-CM

## 2013-08-29 DIAGNOSIS — E118 Type 2 diabetes mellitus with unspecified complications: Secondary | ICD-10-CM

## 2013-08-29 DIAGNOSIS — R002 Palpitations: Secondary | ICD-10-CM

## 2013-08-29 DIAGNOSIS — I5032 Chronic diastolic (congestive) heart failure: Secondary | ICD-10-CM

## 2013-08-29 DIAGNOSIS — I4891 Unspecified atrial fibrillation: Secondary | ICD-10-CM

## 2013-08-29 DIAGNOSIS — E669 Obesity, unspecified: Secondary | ICD-10-CM

## 2013-08-29 NOTE — Progress Notes (Signed)
Patient ID: Tracy Mann, female    DOB: 09/08/1933, 80 y.o.   MRN: [id number]  HPI Comments: Tracy Mann is a 80 year old woman with a PMH significant for chronic atrial fibrillation on Coumadin therapy, diastolic dysfunction, hypertension, hyperlipidemia, morbid obesity, and history of an  inferior wall myocardial infarction that was felt to be secondary to a thrombus from atrial fib. in October 2007 at [facility], previous episodes of chest pain who presents for followup. diabetes, currently on insulin. Followed by Dr. Blossom Hoops. Prior echocardiogram showing moderate pulmonary hypertension.   In followup today, she reports that she is doing well from a cardiac perspective. Slight swelling of her lower extremities. She takes Lasix, occasionally takes metolazone. She has been less active recently given recent severe issues with her right knee. She suffered acute pain, went to the hospital, had workup, outpatient followup for severe continued pain with MRI showing severe knee dysfunction, possible fracture, cartilage issues per the patient. No surgery performed. She seen by Dr. Sabra Heck. Told not to weight bear on her knee. She has been getting weaker, more sedentary. Only recently started to walk on her knee.  On a previous visit, she had worsening shortness of breath, poor energy.  Echocardiogram was done that showed moderately depressed ejection fraction of 35%. Previously she had been 50%. She felt poorly  with shortness of breath and fatigue with any exertion.   Cardiac catheterization was performed to rule out ischemia as a cause of her depressed ejection fraction. This was done on 12/05/2012. She had a right dominant coronary arterial system with mild luminal irregularities, no significant stenoses that could contribute to her depressed ejection fraction. EF was estimated at 45%. There was an aneurysmal/severely hypokinetic region in the distal inferior and apical inferior wall  from old MI 2007.   Previous  echocardiogram several years ago showed ejection fraction 50%. Moderately elevated right ventricular systolic pressures. Previous 48-hour Holter monitor, which showed that she was in persistent atrial fibrillation with one episode of rapid ventricular response.     EKG shows atrial fibrillation with rate of 98 beats per minute, poor R-wave progression through the precordial leads, consider old anterior MI , nonspecific ST/T-wave changes, LAFB  total cholesterol 198, triglycerides 300, LDL 104      Outpatient Encounter Prescriptions as of 08/29/2013  Medication Sig  . furosemide (LASIX) 20 MG tablet Take 1 tablet (20 mg total) by mouth 2 (two) times daily as needed.  Marland Kitchen HYDROcodone-acetaminophen (NORCO) 10-325 MG per tablet Take 1 tablet by mouth as needed.    . insulin glargine (LANTUS) 100 UNIT/ML injection Inject 22 Units into the skin at bedtime.  . Levothyroxine Sodium 88 MCG CAPS Take 88 mcg by mouth daily.  Marland Kitchen lisinopril (PRINIVIL,ZESTRIL) 2.5 MG tablet Take 1 tablet (2.5 mg total) by mouth daily.  Marland Kitchen LORazepam (ATIVAN) 1 MG tablet Take 1 mg by mouth 2 (two) times daily as needed.    . metolazone (ZAROXOLYN) 5 MG tablet TAKE 1 TABLET BY MOUTH TWICE A DAY AS NEEDED  . metoprolol succinate (TOPROL-XL) 25 MG 24 hr tablet TAKE 1/2 TABLET EVERY AM AND 1 TAB AT EVENING  . nitrofurantoin, macrocrystal-monohydrate, (MACROBID) 100 MG capsule Take 100 mg by mouth as needed.   . nitroGLYCERIN (NITROSTAT) 0.4 MG SL tablet Place 1 tablet (0.4 mg total) under the tongue every 5 (five) minutes as needed. May repeat for up to 3 doses.  . phenazopyridine (PYRIDIUM) 100 MG tablet Take 100 mg by  mouth as needed.    . vitamin E 1000 UNIT capsule Take two tablets daily.  Marland Kitchen warfarin (COUMADIN) 3 MG tablet Take 3 mg by mouth daily.  Marland Kitchen warfarin (COUMADIN) 4 MG tablet Take 4 mg by mouth daily. As directed  . ZETIA 10 MG tablet Take 1 tablet by mouth Daily.  Marland Kitchen zolpidem (AMBIEN) 10  MG tablet Take 10 mg by mouth daily.   . [DISCONTINUED] enoxaparin (LOVENOX) 40 MG/0.4ML injection Inject 0.8 mLs (80 mg total) into the skin 2 (two) times daily.  . [DISCONTINUED] phytonadione (VITAMIN K) 5 MG tablet Take 2 tablets (10 mg total) by mouth once.    Review of Systems  Constitutional: Negative.   HENT: Negative.   Eyes: Negative.   Respiratory: Negative.   Cardiovascular: Negative.   Gastrointestinal: Negative.   Endocrine: Negative.   Musculoskeletal: Positive for arthralgias, gait problem and joint swelling.  Skin: Negative.   Allergic/Immunologic: Negative.   Neurological: Negative.   Hematological: Negative.   Psychiatric/Behavioral: Negative.   All other systems reviewed and are negative.  BP 120/68  Pulse 99  Ht 5\' 5"  (1.651 m)  Wt 231 lb 8 oz (105.008 kg)  BMI 38.52 kg/m2  Physical Exam  Nursing note and vitals reviewed. Constitutional: She is oriented to person, place, and time. She appears well-developed and well-nourished.  Obese.  HENT:  Head: Normocephalic.  Nose: Nose normal.  Mouth/Throat: Oropharynx is clear and moist.  Eyes: Conjunctivae are normal. Pupils are equal, round, and reactive to light.  Neck: Normal range of motion. Neck supple. No JVD present.  Cardiovascular: Normal rate, S1 normal, S2 normal and intact distal pulses.  An irregularly irregular rhythm present. Exam reveals no gallop and no friction rub.   Murmur heard.  Crescendo systolic murmur is present with a grade of 2/6  Edema is nonpitting. She has varicies.  Pulmonary/Chest: Effort normal and breath sounds normal. No respiratory distress. She has no wheezes. She has no rales. She exhibits no tenderness.  Abdominal: Soft. Bowel sounds are normal. She exhibits no distension. There is no tenderness.  Musculoskeletal: Normal range of motion. She exhibits no edema and no tenderness.  Lymphadenopathy:    She has no cervical adenopathy.  Neurological: She is alert and oriented  to person, place, and time. Coordination normal.  Skin: Skin is warm and dry. No rash noted. No erythema.  Psychiatric: She has a normal mood and affect. Her behavior is normal. Judgment and thought content normal.    Assessment and Plan

## 2013-08-29 NOTE — Assessment & Plan Note (Signed)
Currently with no symptoms of angina. No further workup at this time. Continue current medication regimen. 

## 2013-08-29 NOTE — Assessment & Plan Note (Signed)
We have encouraged continued careful diet management in an effort to lose weight.  

## 2013-08-29 NOTE — Assessment & Plan Note (Signed)
Heart rate relatively well-controlled. She is on anticoagulation

## 2013-08-29 NOTE — Assessment & Plan Note (Signed)
Unable to tolerate statins in the past. She is tolerating zetia daily. Cholesterol is running high. Minimal CAD 

## 2013-08-29 NOTE — Patient Instructions (Addendum)
You are doing well. No medication changes were made.  The number for [facility]:  [phone] Ortho walk in urgent care number:  [phone]  Please call us if you have new issues that need to be addressed before your next appt.  Your physician wants you to follow-up in: 6 months.  You will receive a reminder letter in the mail two months in advance. If you don't receive a letter, please call our office to schedule the follow-up appointment.

## 2013-08-29 NOTE — Assessment & Plan Note (Signed)
Encouraged her to continue Lasix daily. Metolazone was Lasix as needed for worsening leg edema

## 2013-08-29 NOTE — Assessment & Plan Note (Signed)
Encouraged her to watch her diet

## 2013-09-04 DIAGNOSIS — E039 Hypothyroidism, unspecified: Secondary | ICD-10-CM | POA: Insufficient documentation

## 2013-09-04 DIAGNOSIS — E119 Type 2 diabetes mellitus without complications: Secondary | ICD-10-CM | POA: Insufficient documentation

## 2013-09-04 DIAGNOSIS — Z794 Long term (current) use of insulin: Secondary | ICD-10-CM | POA: Insufficient documentation

## 2013-10-21 ENCOUNTER — Other Ambulatory Visit: Payer: Self-pay | Admitting: *Deleted

## 2013-10-21 ENCOUNTER — Telehealth: Payer: Self-pay | Admitting: *Deleted

## 2013-10-21 MED ORDER — METOPROLOL SUCCINATE ER 25 MG PO TB24: 25.0000 mg | ORAL_TABLET | Freq: Two times a day (BID) | ORAL | Status: DC

## 2013-10-21 NOTE — Telephone Encounter (Signed)
90 day supply. Patient states she needs it 2 times a day???

## 2013-10-21 NOTE — Telephone Encounter (Signed)
Requested Prescriptions   Signed Prescriptions Disp Refills  . metoprolol succinate (TOPROL-XL) 25 MG 24 hr tablet 180 tablet 3    Sig: Take 1 tablet (25 mg total) by mouth 2 (two) times daily.    Authorizing Provider: Minna Merritts    Ordering User: Britt Bottom

## 2014-01-01 ENCOUNTER — Other Ambulatory Visit: Payer: Self-pay | Admitting: Cardiovascular Disease

## 2014-01-13 ENCOUNTER — Telehealth: Payer: Self-pay | Admitting: Cardiovascular Disease

## 2014-01-13 NOTE — Telephone Encounter (Signed)
Spoke w/ pt.  She reports that she has "felt heavy" over the past few days.  Reports wt increase of 4 lbs overnight, ankle edema, tightness in her belly and SOB. Pt reports eating deli meat and leftover [place] recently. She does not have compression hose, does not elevate her feet or restrict her fluids. Had discussion about this w/ pt, as she is resistant to these suggestions. Pt reports that she took 30 mg lasix last night and did not notice a difference.  Advised pt to take an extra lasix 20 mg now, elevate her feet, wrap her feet in ACE bandages, limit her fluids and salt. Advised her that I will speak w/ Christell Faith, PA about her sx and call her back w/ further recommendations.

## 2014-01-13 NOTE — Telephone Encounter (Signed)
Pt called stating she gain 4 pounds over night. She is swelling in her legs, and has never felt like this. But right now she feeling numb. She can't move but so far without getting short of breath. Please advise.

## 2014-01-14 NOTE — Telephone Encounter (Signed)
Spoke w/ pt.  Advised her of Ryan's recommendation.  She reports that her wt is back down today, she has not taken any lasix yet, as she woke up late and her swelling has decreased. Pt states that she is making an effort to be more mindful of the type of foods that eats and how much water she drinks.  Pt reports that she cannot wear compression hose, as she "almost threw my back out trying to put those on." Advised pt to obtain compression hose w/ a zipper or use ACE bandages.  She states that she does not feel the need to increase lasix at this time, but would like to schedule an appt w/ Dr. Rockey Situ in mid to late January. Advised pt that if she needs to increase her lasix over the weekend, to call and we will draw a BMET. She is agreeable to this and is sched to see Dr. Rockey Situ on 02/23/14. Asked her to call back w/ any questions or concerns.

## 2014-01-14 NOTE — Telephone Encounter (Signed)
Dr. Donivan Scull note indicates she should be on Lasix 20 mg bid, perhaps daily.  -Not sure where the 30 mg she took came from  -Lifestyle modification is the mainstay here -She must limit the salt intake (deli meats and limit water intake to less than 2 L daily) the more she puts in the more we have to take out -She must get some compression hose if she wants to get better -Increase Lasix to 40 mg bid for the next 2 days then go back to 20 mg bid  -Check a bmet Monday

## 2014-01-22 ENCOUNTER — Inpatient Hospital Stay: Payer: Self-pay | Admitting: Internal Medicine

## 2014-01-22 LAB — CBC
HCT: 33.9 % — AB (ref 35.0–47.0)
HGB: 11.3 g/dL — ABNORMAL LOW (ref 12.0–16.0)
MCH: 30.3 pg (ref 26.0–34.0)
MCHC: 33.4 g/dL (ref 32.0–36.0)
MCV: 91 fL (ref 80–100)
Platelet: 216 10*3/uL (ref 150–440)
RBC: 3.74 10*6/uL — AB (ref 3.80–5.20)
RDW: 14.1 % (ref 11.5–14.5)
WBC: 8.5 10*3/uL (ref 3.6–11.0)

## 2014-01-22 LAB — BASIC METABOLIC PANEL
Anion Gap: 9 (ref 7–16)
BUN: 30 mg/dL — ABNORMAL HIGH (ref 7–18)
CALCIUM: 8.2 mg/dL — AB (ref 8.5–10.1)
Chloride: 101 mmol/L (ref 98–107)
Co2: 27 mmol/L (ref 21–32)
Creatinine: 1 mg/dL (ref 0.60–1.30)
EGFR (African American): >60
EGFR (Non-African Amer.): 57 — ABNORMAL LOW
Glucose: 150 mg/dL — ABNORMAL HIGH (ref 65–99)
Osmolality: 283 (ref 275–301)
POTASSIUM: 3.8 mmol/L (ref 3.5–5.1)
SODIUM: 137 mmol/L (ref 136–145)

## 2014-01-22 LAB — TROPONIN I: Troponin-I: <0.02 ng/mL

## 2014-01-22 LAB — PROTIME-INR
INR: 3.3
PROTHROMBIN TIME: 32.4 s — AB (ref 11.5–14.7)

## 2014-01-23 LAB — CBC WITH DIFFERENTIAL/PLATELET
Basophil #: 0 10*3/uL (ref 0.0–0.1)
Basophil %: 0.1 %
EOS ABS: 0 10*3/uL (ref 0.0–0.7)
Eosinophil %: 0.1 %
HCT: 34 % — ABNORMAL LOW (ref 35.0–47.0)
HGB: 11.2 g/dL — ABNORMAL LOW (ref 12.0–16.0)
LYMPHS ABS: 1.1 10*3/uL (ref 1.0–3.6)
Lymphocyte %: 22.8 %
MCH: 29.8 pg (ref 26.0–34.0)
MCHC: 32.9 g/dL (ref 32.0–36.0)
MCV: 90 fL (ref 80–100)
MONO ABS: 0.2 x10 3/mm (ref 0.2–0.9)
Monocyte %: 4.4 %
NEUTROS PCT: 72.6 %
Neutrophil #: 3.6 10*3/uL (ref 1.4–6.5)
Platelet: 232 10*3/uL (ref 150–440)
RBC: 3.76 10*6/uL — AB (ref 3.80–5.20)
RDW: 13.9 % (ref 11.5–14.5)
WBC: 5 10*3/uL (ref 3.6–11.0)

## 2014-01-23 LAB — BASIC METABOLIC PANEL
ANION GAP: 6 — AB (ref 7–16)
BUN: 26 mg/dL — AB (ref 7–18)
CHLORIDE: 100 mmol/L (ref 98–107)
CO2: 30 mmol/L (ref 21–32)
Calcium, Total: 8.7 mg/dL (ref 8.5–10.1)
Creatinine: 0.94 mg/dL (ref 0.60–1.30)
EGFR (African American): >60
EGFR (Non-African Amer.): >60
Glucose: 270 mg/dL — ABNORMAL HIGH (ref 65–99)
Osmolality: 286 (ref 275–301)
Potassium: 4.4 mmol/L (ref 3.5–5.1)
Sodium: 136 mmol/L (ref 136–145)

## 2014-01-23 LAB — PROTIME-INR
INR: 3.3
Prothrombin Time: 32.7 secs — ABNORMAL HIGH (ref 11.5–14.7)

## 2014-01-24 LAB — PROTIME-INR
INR: 4.7
Prothrombin Time: 42.5 secs — ABNORMAL HIGH (ref 11.5–14.7)

## 2014-01-25 LAB — PROTIME-INR
INR: 4.7 — AB
PROTHROMBIN TIME: 42.9 s — AB (ref 11.5–14.7)

## 2014-01-26 LAB — PROTIME-INR
INR: 3.5
PROTHROMBIN TIME: 34.2 s — AB (ref 11.5–14.7)

## 2014-01-27 LAB — CULTURE, BLOOD (SINGLE)

## 2014-02-18 ENCOUNTER — Telehealth: Payer: Self-pay | Admitting: Cardiovascular Disease

## 2014-02-18 NOTE — Telephone Encounter (Signed)
Spoke w/ pt.  She states that she has [employer] ins and cannot make an appt w/o a referral from PCP.  Advised her that she will need to contact Dr. Alben Spittle office.  She verbalizes understanding and will call back w/ any questions or concerns.

## 2014-02-18 NOTE — Telephone Encounter (Signed)
Pt is calling stating she can't come see [place] and she needs to be checked bc we need to get a authorization from [place] [phone]  Please call patient when done.

## 2014-02-18 NOTE — Telephone Encounter (Signed)
Attempted to contact pt for clarification as to what she needs. No answer, no machine.

## 2014-02-23 ENCOUNTER — Ambulatory Visit: Payer: Medicare Other | Admitting: Cardiovascular Disease

## 2014-03-11 ENCOUNTER — Ambulatory Visit: Payer: Self-pay | Admitting: Family Medicine

## 2014-03-13 ENCOUNTER — Encounter: Payer: Self-pay | Admitting: Cardiovascular Disease

## 2014-03-13 ENCOUNTER — Ambulatory Visit (INDEPENDENT_AMBULATORY_CARE_PROVIDER_SITE_OTHER): Payer: Commercial Managed Care - HMO | Admitting: Cardiovascular Disease

## 2014-03-13 VITALS — BP 112/64 | HR 92 | Ht 65.0 in | Wt 227.8 lb

## 2014-03-13 DIAGNOSIS — R0602 Shortness of breath: Secondary | ICD-10-CM

## 2014-03-13 DIAGNOSIS — I4891 Unspecified atrial fibrillation: Secondary | ICD-10-CM

## 2014-03-13 DIAGNOSIS — I2510 Atherosclerotic heart disease of native coronary artery without angina pectoris: Secondary | ICD-10-CM

## 2014-03-13 DIAGNOSIS — E785 Hyperlipidemia, unspecified: Secondary | ICD-10-CM

## 2014-03-13 NOTE — Assessment & Plan Note (Signed)
Chronic shortness of breath likely secondary to deconditioning, obesity, mild chronic diastolic/systolic  CHF

## 2014-03-13 NOTE — Patient Instructions (Addendum)
You are doing well. No medication changes were made.  Consider taking a whole metoprolol in the AM and 1/2 in the PM Start a walking program daily  Please call us if you have new issues that need to be addressed before your next appt.  Your physician wants you to follow-up in: 6 months.  You will receive a reminder letter in the mail two months in advance. If you don't receive a letter, please call our office to schedule the follow-up appointment.

## 2014-03-13 NOTE — Assessment & Plan Note (Signed)
Unable to tolerate statins in the past. She is tolerating zetia daily. Cholesterol is running high. Minimal CAD

## 2014-03-13 NOTE — Progress Notes (Signed)
Patient ID: Tracy Mann, female    DOB: 1933-07-11, 81 y.o.   MRN: [id number]  HPI Comments: Tracy Mann is a 81 year old woman with a PMH significant for chronic atrial fibrillation on Coumadin therapy, diastolic dysfunction, hypertension, hyperlipidemia, morbid obesity, and history of an  inferior wall myocardial infarction that was felt to be secondary to a thrombus from atrial fib. in October 2007 at [facility], previous episodes of chest pain who presents for followup. diabetes, currently on insulin. Followed by Dr. Blossom Hoops. Prior echocardiogram showing moderate pulmonary hypertension.  She presents today for follow-up of her atrial fibrillation Previous echocardiogram showing ejection fraction 35-40% in 2014 Follow-up cardiac catheterization showing ejection fraction 45-50%  In general she reports that she has been doing well, feels stable. She continues to have mild chronic leg swelling. She reports having pneumonia December 2015 and is still recovering. Still feels weak. She reports chronic shortness of breath with exertion. She does not do any regular exercise She takes Lasix as needed when leg edema gets worse Continued chronic knee problems, difficulty walking long distances  EKG on today's visit shows atrial fibrillation with ventricular rate 92 bpm, rare PVC  Other past medical history On a previous visit, she had worsening shortness of breath, poor energy.  Echocardiogram was done that showed moderately depressed ejection fraction of 35%. Previously she had been 50%. She felt poorly  with shortness of breath and fatigue with any exertion.   Cardiac catheterization was performed to rule out ischemia as a cause of her depressed ejection fraction. This was done on 12/05/2012. She had a right dominant coronary arterial system with mild luminal irregularities, no significant stenoses that could contribute to her depressed ejection fraction. EF was estimated at 45%.  There was an aneurysmal/severely hypokinetic region in the distal inferior and apical inferior wall from old MI 2007.   Previous  echocardiogram several years ago showed ejection fraction 50%. Moderately elevated right ventricular systolic pressures. Previous 48-hour Holter monitor, which showed that she was in persistent atrial fibrillation with one episode of rapid ventricular response.      total cholesterol 198, triglycerides 300, LDL 104      Allergies  Allergen Reactions  . Clarithromycin     Hives, headaches, hard time swelling, felt like throat was closing up  . Sulfonamide Derivatives Swelling    'tongue swelling'    Outpatient Encounter Prescriptions as of 03/13/2014  Medication Sig  . furosemide (LASIX) 20 MG tablet Take 1 tablet (20 mg total) by mouth 2 (two) times daily as needed.  Marland Kitchen HYDROcodone-acetaminophen (NORCO) 10-325 MG per tablet Take 1 tablet by mouth as needed.    . insulin glargine (LANTUS) 100 UNIT/ML injection Inject 22 Units into the skin at bedtime.  . Levothyroxine Sodium 88 MCG CAPS Take 88 mcg by mouth daily.  Marland Kitchen lisinopril (PRINIVIL,ZESTRIL) 2.5 MG tablet Take 1 tablet (2.5 mg total) by mouth daily.  Marland Kitchen LORazepam (ATIVAN) 1 MG tablet Take 1 mg by mouth 2 (two) times daily as needed.    . metolazone (ZAROXOLYN) 5 MG tablet TAKE 1 TABLET BY MOUTH TWICE A DAY AS NEEDED  . metoprolol succinate (TOPROL-XL) 25 MG 24 hr tablet Take 1 tablet (25 mg total) by mouth 2 (two) times daily.  . nitrofurantoin, macrocrystal-monohydrate, (MACROBID) 100 MG capsule Take 100 mg by mouth as needed.   . nitroGLYCERIN (NITROSTAT) 0.4 MG SL tablet Place 1 tablet (0.4 mg total) under the tongue every 5 (five) minutes as  needed. May repeat for up to 3 doses.  . phenazopyridine (PYRIDIUM) 100 MG tablet Take 100 mg by mouth as needed.    . warfarin (COUMADIN) 3 MG tablet Take 3 mg by mouth daily.  Marland Kitchen ZETIA 10 MG tablet Take 1 tablet by mouth Daily.  Marland Kitchen zolpidem (AMBIEN) 10 MG tablet  Take 10 mg by mouth daily.   . [DISCONTINUED] vitamin E 1000 UNIT capsule Take two tablets daily.  . [DISCONTINUED] warfarin (COUMADIN) 4 MG tablet Take 4 mg by mouth daily. As directed    Past Medical History  Diagnosis Date  . Chronic atrial fibrillation   . Diastolic dysfunction     Chronic  . Chronic anticoagulation   . Acute myocardial infarction of inferior wall     Secondary to a coronary embolus probably secondary to her atrial fibrillation.  She had nonobstructive disease otherwise  . Hypertension   . Obesity   . Hyperlipidemia, mixed   . Diabetes mellitus     Past Surgical History  Procedure Laterality Date  . Vesicovaginal fistula closure w/ tah  1996  . Tubal ligation  1968  . Cholecystectomy  1999  . Cardiac catheterization  11/2012    [facility];EF 45-50%    Social History  reports that she has never smoked. She has never used smokeless tobacco. She reports that she does not drink alcohol or use illicit drugs.  Family History family history includes Heart disease in her mother.  Review of Systems  Constitutional: Negative.   Respiratory: Negative.   Cardiovascular: Negative.   Gastrointestinal: Negative.   Musculoskeletal: Positive for joint swelling, arthralgias and gait problem.  Skin: Negative.   Neurological: Negative.   Hematological: Negative.   Psychiatric/Behavioral: Negative.   All other systems reviewed and are negative.  BP 112/64 mmHg  Pulse 92  Ht 5\' 5"  (1.651 m)  Wt 227 lb 12 oz (103.307 kg)  BMI 37.90 kg/m2  Physical Exam  Constitutional: She is oriented to person, place, and time. She appears well-developed and well-nourished.  Obese.  HENT:  Head: Normocephalic.  Nose: Nose normal.  Mouth/Throat: Oropharynx is clear and moist.  Eyes: Conjunctivae are normal. Pupils are equal, round, and reactive to light.  Neck: Normal range of motion. Neck supple. No JVD present.  Cardiovascular: Normal rate, S1 normal, S2 normal and intact distal  pulses.  An irregularly irregular rhythm present. Exam reveals no gallop and no friction rub.   Murmur heard.  Crescendo systolic murmur is present with a grade of 2/6  Edema is nonpitting. She has varicies.  Pulmonary/Chest: Effort normal and breath sounds normal. No respiratory distress. She has no wheezes. She has no rales. She exhibits no tenderness.  Abdominal: Soft. Bowel sounds are normal. She exhibits no distension. There is no tenderness.  Musculoskeletal: Normal range of motion. She exhibits no edema or tenderness.  Lymphadenopathy:    She has no cervical adenopathy.  Neurological: She is alert and oriented to person, place, and time. Coordination normal.  Skin: Skin is warm and dry. No rash noted. No erythema.  Psychiatric: She has a normal mood and affect. Her behavior is normal. Judgment and thought content normal.    Assessment and Plan  Nursing note and vitals reviewed.

## 2014-03-13 NOTE — Assessment & Plan Note (Signed)
Heart rate adequate, we did suggest she could try full dose metoprolol in the morning, half dose at nighttime. Currently she takes metoprolol succinate one half pill twice a day Tolerating anticoagulation

## 2014-03-13 NOTE — Assessment & Plan Note (Signed)
Currently with no symptoms of angina. No further workup at this time. Continue current medication regimen. 

## 2014-05-23 NOTE — Discharge Summary (Signed)
PATIENT NAME:  Tracy Mann, Tracy Mann MR#:  [id number] DATE OF BIRTH:  Jun 23, 1933  DATE OF ADMISSION:  05/06/2013 DATE OF DISCHARGE:  05/08/2013  PRESENTING COMPLAINT: Right knee pain and swelling.   DISCHARGE DIAGNOSES:  1.  Right knee degenerative joint disease with swelling, status post arthrocentesis.  2.  Coronary artery disease.  3.  Chronic atrial fibrillation.   CODE STATUS: Full code.   MEDICATIONS:  1.  Lasix 20 mg 2 tablets daily as needed.  2.  Norco 10/325, 1 tablet every 6 hours.  3.  Lantus 20 units at bedtime.  4.  Levothyroxine 88 mcg p.o. daily.  5.  Lisinopril 2.5 mg p.o. daily.  6.  Lorazepam 1 mg 2 times a day as needed.  7.  Zaroxolyn 2.5 mg b.i.d. as needed.  8.  Toprol-XL 25 mg 1/2 tablet in the morning and 1 tablet in the evening.  9.  Coumadin 3 mg p.o. daily.  10. Nitrostat 0.4 mg sublingual as needed.  11. Zetia 10 mg daily.  12. Ambien 10 mg daily at bedtime.   DIET: Carbohydrate-controlled diet.  FOLLOWUP:  1.  Dr. Sabra Heck orthopedic in 1 to 2 weeks.  2. Dr. Rosanna Randy in 1 to 2 weeks.   ORTHOPEDICS CONSULTATION: Dr. Sabra Heck.    LABS: Spotty fluid right knee, no growth in 4 days. Appeared suggestive of inflammatory DJD. White count was 10.5.   BRIEF SUMMARY OF HOSPITAL COURSE: Tracy Mann is a 81 year old Caucasian female with history of CAD, A. fib. on Coumadin and diabetes, who came in with:  1.  Right knee swelling and pain. She underwent a right knee arthrocentesis, which was suggestive of DJD on fluid analysis. She was given empiric antibiotics, which were discontinued. She did not have any fevers. Her joints appear normal. Her white cell count was 10,000. She did well with physical therapy. No PT needs were recommended.  2.  Type 2 diabetes. Home meds were continued.  3.  CAD, stable.  4.  Chronic atrial fibrillation, stable heart rate. Continued Coumadin for her anticoagulation.  5.  Hospital stay otherwise remained stable.   CODE STATUS: The patient  remained a full code.   TIME SPENT: 40 minutes.  ____________________________ Hart Rochester Posey Pronto, MD sap:aw D: 05/13/2013 11:45:56 ET T: 05/13/2013 12:13:45 ET JOB#: [id number]  cc: Tracy Kareem A. Posey Pronto, MD, <Dictator> Tracy Basset MD ELECTRONICALLY SIGNED 05/26/2013 9:07

## 2014-05-23 NOTE — Consult Note (Signed)
Brief Consult Note: Diagnosis: Right knee pain.   Patient was seen by consultant.   Recommend to proceed with surgery or procedure.   Recommend further assessment or treatment.   Orders entered.   Discussed with Attending MD.   Comments: 81 year old female complains of right knee pain for several days without injury. She was unable to walk yesterday and was brought to Emergency Room where exam and X-rays showed knee pain and normal X-rays. Knee was thought to be red and swollen.  white blood count initially slightly high.  Started on IV antibiotics and admitted.  Currently she says swelling is less and pain is moderate.  No history of gout.  No recent infections.    Exam:  alert and cooperative.  Mild diffuse tenderness around knee.  range of motion 0-75*.  Stable to stress.  No overt reddness but leg is warm all over.  Minimal effusion palpable.  circulation/sensation/motor function good distally.    X-rays: no fracture or dislocation. No arthritis. No evidence of infection.  RX: Knee aspirated sterily and 5cc of serosanguinous fluid removed.  No pus.  No sign of infection.         Fluid sent for culture, cell counts, and crystals.         Ice, knee immobilizer, ace.       Physical Therapy tomorrow for ambulation.    Imp:  Right knee pain and slight bloody effusion.  Etiology unclear.  ?? gout. Did not feel it safe to inject cortisone.  Would give her an NSAIDs if feasible.  Will follow.  Electronic Signatures: Park Breed (MD)  (Signed 08-Apr-15 17:59)  Authored: Brief Consult Note   Last Updated: 08-Apr-15 17:59 by Park Breed (MD)

## 2014-05-23 NOTE — Consult Note (Signed)
Brief Consult Note: Diagnosis: None.   Patient was seen by consultant.   Comments: Ortho asked to consult for knee pain. Patient denies knee pain at this time. Normal exam. XR show mild DJD. No treatment needed. May follow-up PRN.  Electronic Signatures: Mardene Sayer (MD)  (Signed 27-Dec-15 20:53)  Authored: Brief Consult Note   Last Updated: 27-Dec-15 20:53 by Mardene Sayer (MD)

## 2014-05-23 NOTE — Discharge Summary (Signed)
PATIENT NAME:  Tracy Mann, Tracy Mann MR#:  [id number] DATE OF BIRTH:  03-Sep-1933  DATE OF ADMISSION:  01/22/2014 DATE OF DISCHARGE:  01/26/2014  DISCHARGE DIAGNOSES:  1.  Acute hypoxic respiratory failure secondary to bilateral pneumonia.   2.  Right knee pain due to degenerative joint disease.   SECONDARY DIAGNOSES:  1.  Diabetes.  2.  Hypertension.  3.  Chronic atrial fibrillation.  4.  Hyperlipidemia.  5.  Coronary artery disease.  6.  Congestive heart failure.  7.  Osteoarthritis.   CONSULTATIONS:  1.  Orthopedics, Mardene Sayer, MD  2.  Physical and occupational therapy.   PROCEDURES AND RADIOLOGY:  1.  Chest x-ray on the 24th of December showed cardiomegaly with new patchy perihilar opacities suggestive of bilateral pneumonia.  2.  A repeat chest x-ray on the 27th of December showed pneumonia.  3.  Right knee x-ray on the 27th of December showed DJD.   LABORATORY PANEL: Blood cultures x 3 were negative.   HISTORY AND SHORT HOSPITAL COURSE: The patient is a 81 year old female with above mentioned medical problems, was admitted for shortness of breath, cough and generalized weakness. She was found to have acute hypoxic respiratory failure secondary to bilateral pneumonia. Please see Dr. Governor Specking dictated history and physical for further details. The patient was slowly improving with treatment of pneumonia with IV antibiotics. She was also evaluated by physical and occupational therapy and was recommended home health services to set up by care management. She was close to her baseline on the 28th of December and was discharged home in stable condition. On the date of discharge, her vital signs were as follows:  Temperature 98, heart rate 65 per minute, respirations 19 per minute, blood pressure 130/73. She was saturating 97% on room air, although she desaturated to 87% with exertion.   PERTINENT PHYSICAL EXAMINATION ON THE DATE OF DISCHARGE:  CARDIOVASCULAR: S1, S2 normal. No murmurs, rubs  or gallops.  LUNGS: Clear to auscultation bilaterally. No wheezing, rales, rhonchi or crepitation.  ABDOMEN: Soft, benign.  NEUROLOGIC: Nonfocal examination.   All other physical examination remained at baseline.   DISCHARGE MEDICATIONS:   Medication Instructions  furosemide 20 mg oral tablet  2 tab(s) orally once a day, As Needed for fluid.    nitrostat 0.4 mg sublingual tablet  1 tab(s) sublingual every 5 minutes, As Needed -for Chest Pain   zolpidem 10 mg oral tablet  1 tab(s) orally once a day (at bedtime), As Needed - for Inability to Sleep   lantus solostar pen 100 units/ml subcutaneous solution  15-20 unit(s) subcutaneous once a day (at bedtime) depending on sugar.    levothyroxine 88 mcg (0.088 mg) oral tablet  1 tab(s) orally once a day (at bedtime)   lisinopril 2.5 mg oral tablet  1 tab(s) orally once a day (at bedtime)   lorazepam 1 mg oral tablet  1-2 tab(s) orally 2 times a day, As Needed - for Anxiety, Nervousness   acetaminophen-hydrocodone 325 mg-10 mg oral tablet  1 tab(s) orally every 6 hours, As Needed - for Pain   metoprolol 25 mg oral tablet, extended release  0.5 tab(s) orally once a day (in the morning) and 1 in the evening.    zetia 10 mg oral tablet  1 tab(s) orally once a day (at bedtime)   humalog kwikpen  unit(s) subcutaneous 3 times a day (with meals) depending on sugars.    warfarin 1 mg oral tablet  2.5 tab(s) orally once a day  ropinirole 0.25 mg oral tablet  1 tab(s) orally once a day (at bedtime)   fluticasone nasal  2 spray(s)  2 times a day   levofloxacin 750 mg oral tablet  1 tab(s) orally every 24 hours x 6 days   gabapentin 300 mg oral capsule  1 cap(s) orally once a day (at bedtime)       DISCHARGE DIET: Low sodium, low fat, low cholesterol, 1800 ADA.   DISCHARGE ACTIVITY: As tolerated.   DISCHARGE INSTRUCTIONS AND FOLLOWUP: The patient was instructed to follow up with her primary care physician, Dr. Miguel Aschoff, in 1-2 weeks. She will  follow up with [facility] in 2-4 weeks. She was instructed to get CBC and basic metabolic panel, PT/INR checked on 30th of December with results forwarded to primary care physician for electrolyte, kidney and blood count monitoring along with Coumadin dose adjustment if needed.   She was set up to get home health, physical therapy, occupational therapy, nurse, and nursing aide by care management. She was instructed to use 2 liters oxygen via nasal cannula continuous for now.   TOTAL TIME DISCHARGING THIS PATIENT: Forty-five minutes.   ____________________________ Lucina Mellow. Manuella Ghazi, MD vss:TT D: 01/28/2014 20:11:00 ET T: 01/28/2014 20:31:33 ET JOB#: [id number]  cc: Delfino Lovett L. Rosanna Randy, [place]. Manuella Ghazi, MD, <Dictator>  Lucina Mellow [facility] MD ELECTRONICALLY SIGNED 01/29/2014 6:14

## 2014-05-23 NOTE — H&P (Signed)
PATIENT NAME:  Tracy Mann, TULLER MR#:  [id number] DATE OF BIRTH:  1933/03/30  PRIMARY CARE PHYSICIAN:  Birdie Sons, MD   EMERGENCY ROOM PHYSICIAN:  Boris Lown, MD   CHIEF COMPLAINT:  Shortness of breath and cough and generalized weakness.   HISTORY OF PRESENT ILLNESS: On this is a 81 year old female patient who is having trouble with cough for about a week. Went to urgent care last week, and she was given Biaxin. The patient was taking Biaxin twice a day since Saturday, and she went to see Dr. Caryn Section because of a persistent shortness of breath, cough, and not getting better. Her x-ray of chest showed she has bilateral pneumonia, and she was sent in here for further evaluation and antibiotics. The patient even became hypoxic with O2 sats of 89% on room air and chest x-ray showed bilateral pneumonia, so we are admitting her for pneumonia and acute hypoxic respiratory failure, shortness of breath, cough, and wheezing for about a week. Had a fever of 102 last night. The patient has some pedal edema, but no orthopnea or PND. Does have a localized phlegm whenever she coughs. No runny nose, no sore throat, no earache.   PAST MEDICAL HISTORY: Significant for a history of diabetes, hypertension, chronic atrial fibrillation, hyperlipidemia, coronary artery disease, CHF, and osteoarthritis.   PAST SURGICAL HISTORY: Significant for hysterectomy.   SOCIAL HISTORY: No smoking, no drinking. The patient lives alone.   FAMILY HISTORY: Mother had heart problems.   MEDICATIONS: Percocet 10/325 one tablet every 6 hours as needed for pain, furosemide 20 mg 2 tablets daily as needed for fluid, Lantus SoloSTAR 15 to 20 units at bedtime, depending on sliding scale. Levothyroxine 88 mcg daily in the morning, lisinopril 2.7 mg daily, Ativan 1 mg p.o. b.i.d. as needed for anxiety. Metoprolol 25 mg 1/2 tablet in the morning and 1 tablet in the evening, Nitrostat 0.4 mg sublingual every 5 minutes p.r.n. chest pain,  Zetia 10 mg daily, Coumadin 4 mg p.o. daily, and Ambien 10 mg at bedtime.    REVIEW OF SYSTEMS:  CONSTITUTIONAL: The patient feels tired and had a fever yesterday.  EYES: No blurred vision.  EARS, NOSE, AND THROAT: No tinnitus. No epistaxis. No difficulty swallowing. This patient has a cough, shortness of breath, and wheezing for 1 week.  CARDIOVASCULAR: No chest pain, No orthopnea. Does have some pedal edema.  GASTROINTESTINAL: No nausea. No vomiting. No diarrhea. No abdominal pain.  GENITOURINARY: No dysuria.  ENDOCRINE: No polyuria or nocturia.  HEMATOLOGIC: No anemia or easy bleeding.  INTEGUMENTARY: No skin rashes.  MUSCULOSKELETAL: The patient has no joint pain.  NEUROLOGIC: No muscle weakness.  PSYCHIATRIC: No anxiety or insomnia.  PHYSICAL EXAMINATION: VITAL SIGNS: Temperature 98.6, heart rate 87, blood pressure 128/63, sats 89% on room air. She is alert, awake, oriented female not in distress. The patient, right now, is on 2 liters, saturations are 95%.  HEAD: Normocephalic, atraumatic.  EYES: Pupils equal, reacting to light. Extraocular movements are intact.  EARS, NOSE, AND THROAT: No tympanic membrane congestion. No turbinate hypertrophy. No oropharyngeal edema.  NECK: Supple. No JVD. No carotid bruit. Normal range of motion.  RESPIRATORY: Bilateral expiratory wheeze in all lung fields. The patient not using accessory muscles of respiration. CARDIOVASCULAR: S1, S2 regular. The patient is in atrial fibrillation, but rate is controlled, does have 2+ pedal edema up to knees of both bilaterally.  MUSCULOSKELETAL: The patient is able to move extremities x 4, strength and tone are equal bilaterally.  SKIN: Inspection is normal.  LYMPHATICS: No lymphadenopathy in the leg or axillary region.  NEUROLOGIC: Cranial nerves II through XII intact. Power 5/5 upper and lower extremities. Sensation intact. DTRs 2+ bilaterally. PSYCHIATRIC: Mood  and affect are within normal limits.    LABORATORY DATA: WBC 8.5, hemoglobin 11.3, hematocrit 33.9. platelets 216. Electrolytes: Sodium is 137, potassium 3.8, chloride 101, bicarbonate 27, BUN 30, creatinine 1, glucose 150. Troponin less than 0.02.   IMAGING STUDIES: Chest x-ray shows cardiomegaly with a new patchy perihilar opacities bilaterally suspicious for early bronchopneumonia. EKG: Atrial fibrillation with PVCs, 89 beats per minutes.   ASSESSMENT AND PLAN: 1. This is a 81 year old female with acute hypoxic respiratory failure secondary to bilateral pneumonia. Admit her to hospitalist service. Start her on Levaquin, oxygen, and follow blood cultures and sputum cultures.  2. Reactive airway disease with wheezing. Start her on IV steroids, nebulizers, and see how she does.  3. The patient failed outpatient therapy with pneumonia and respiratory distress. Continue the current antibiotics with Levaquin, follow the blood cultures.  4. Diabetes mellitus type :continue levemir, and continue sliding scale   5. History of Diastolic  heart failure. She is on Lasix, and I started the Lasix at 20 mg daily. She does have pedal edema of 1+. The patient said that she takes Lasix as needed.  6. Coronary artery disease. She is on statins. Continue Zetia and metoprolol.  7. Hypothyroidism. Continue Synthroid 88 mcg daily.  8. History of anxiety. Continue Ativan and Ambien . 9. Chronic atrial fibrillation. Rate is controlled with Coumadin and her rate is controlled with metoprolol. Continue anticoagulation with Coumadin. Pharmacy to dose anticoagulation.   TIME SPENT:  55 minutes.    ____________________________ Epifanio Lesches, MD sk:mw D: 01/22/2014 16:25:17 ET T: 01/22/2014 16:47:22 ET JOB#: [id number]  cc: Epifanio Lesches, MD, <Dictator> Epifanio Lesches MD ELECTRONICALLY SIGNED 01/22/2014 20:42

## 2014-05-23 NOTE — H&P (Signed)
PATIENT NAME:  Tracy Mann, Tracy Mann MR#:  [id number] DATE OF BIRTH:  [birth year]  DATE OF ADMISSION:  05/07/2013  PRIMARY CARE PHYSICIAN:  Dr. Miguel Aschoff.   REFERRING PHYSICIAN:  Dr. Jasmine December.   CHIEF COMPLAINT:  Right knee pain with swelling.   HISTORY OF PRESENT ILLNESS:  The patient is a 81 year old pleasant Caucasian female with a past medical history of coronary artery disease, MI in the past, congestive heart failure, chronic atrial fibrillation on Coumadin, INR 2.2 today, insulin-dependent diabetes mellitus and psoriasis is presenting to the ER with a chief complaint of right knee pain and swelling.  The patient is reporting that she first noticed having right knee pain approximately four days ago associated with swelling.  Slowly the swelling has gotten worse and the patient has been having difficulty with ambulation.  The patient could not ambulate at all today and called her two sons.  Both of her sons helped her and brought her into the ER.  She denies any trauma or injury to the right knee.  No similar complaints in the past, but she has chronic history of osteoarthritis.  As the patient was also complaining of right lower extremity weakness a CAT scan of the head was done which is negative.  DVT was ruled out.  X-ray of the knee has ruled out fractures.  Blood cultures were obtained.  The patient was started on IV antibiotics and hospitalist team is called to admit the patient.  The patient has chronic history of atrial fibrillation, takes Coumadin and her INR is at 2.2.  During my examination, the patient is still complaining of severe pain in the right knee area.  She is worried about swelling in the right knee area.  She denies any history of gout.  No family members are at bedside.  Denies any chest pain, shortness of breath.  No fevers.   PAST MEDICAL HISTORY:  Coronary artery disease, congestive heart failure, chronic atrial fibrillation, rate controlled on Coumadin, insulin-dependent  diabetes mellitus, psoriasis, osteoarthritis.   PAST SURGICAL HISTORY:  Hysterectomy.   ALLERGIES:  ASPIRIN, SULFA.   PSYCHOSOCIAL HISTORY:  Lives at home, lives alone.  Denies smoking, alcohol or illicit drug usage.   FAMILY HISTORY:  Mom had heart problems.   REVIEW OF SYSTEMS:  CONSTITUTIONAL:  Denies any fever, fatigue, weight gain or weight loss.  EYES:  Denies blurry vision, double vision, glaucoma, eye pain or discharge.  EARS, NOSE, THROAT:  Denies epistaxis, discharge, tinnitus, hearing loss.  RESPIRATORY:  Denies cough, COPD, wheezing, hemoptysis, dyspnea.  CARDIOVASCULAR:  No chest pain, palpitations, syncope.  GASTROINTESTINAL:  Denies nausea, vomiting, diarrhea, abdominal pain.  GENITOURINARY:  No dysuria, hematuria, frequency. GYNECOLOGIC AND BREAST:  Denies breast mass.  Status post hysterectomy.  ENDOCRINE:  Denies polyuria, nocturia, thyroid problems.  Has chronic insulin-dependent diabetes mellitus.  HEMATOLOGIC AND LYMPHATIC:  No anemia, easy bruising, bleeding.  INTEGUMENTARY:  No acne, rash, lesions.  MUSCULOSKELETAL:  Complaining of severe right knee pain associated with swelling.  Has osteoarthritis.  Denies any hip pain.  Denies gout.  NEUROLOGIC:  Denies vertigo, ataxia, dementia.  PSYCHIATRIC:  No ADD, OCD, insomnia.   PHYSICAL EXAMINATION: VITAL SIGNS:  Temperature 98.6, pulse 93, respirations 18, blood pressure 145/98, pulse ox 94%.  GENERAL APPEARANCE:  Not under acute distress, but uncomfortable from right knee pain, moderately built and nourished.  HEENT:  Normocephalic, atraumatic.  Pupils are equally reacting to light and accommodation.  No scleral icterus.  No conjunctival injection.  No  sinus tenderness.  No postnasal drip.  Moist mucous membranes.  NECK:  Supple.  No JVD.  No thyromegaly.  Range of motion is intact.  LUNGS:  Clear to auscultation bilaterally.  No accessory muscle usage.  No anterior chest wall tenderness on palpation.   CARDIOVASCULAR:  Irregularly irregular.  No murmurs.  GASTROINTESTINAL:  Soft.  Bowel sounds are positive in all four quadrants.  Nontender, nondistended.  No hepatosplenomegaly.  No masses felt.  NEUROLOGIC:  Awake, alert, oriented x 3.   MOTOR:  Right lower extremity, decreased range of motion from right knee effusion.  Sensory is intact.  Left lower extremity motor and sensory are intact.  Bilateral upper extremities, motor and sensory are intact.  Reflexes are 2+.  EXTREMITIES:  No edema.  No cyanosis.  No clubbing.  Peripheral pulses are 2+.  MUSCULOSKELETAL:  Right knee is erythematous, tender to touch with a large joint effusion.  Range of motion is limited from effusion.  The rest of the joints are intact.  PSYCHIATRIC:  Normal mood and affect.  SKIN:  Warm to touch.  Normal turgor.  No rashes.  No lesions.   LABORATORY AND IMAGING STUDIES:  CT of the head without contrast has revealed no acute intracranial changes, mild chronic ischemic microvascular disease.  Lumbar spine AP and lateral views:  No evidence of acute fracture or subluxation along the lower lumbar spine.  Mild degenerative change at the lower lumbar spine with facet disease.  Right extremity ultrasound, no evidence of deep venous thrombosis within the right lower extremity.  The right knee x-ray is ordered which is pending at this time.  BNP 611, glucose 127, BUN 22, creatinine 0.78, sodium 134, potassium 3.8, chloride 99, CO2 33.  GFR greater than 60.  Anion gap 2.Calcium and magnesium are normal.  WBC 12.9, hemoglobin 13.7, hematocrit is 41.7, platelets 231.  LFTs are normal.  Troponin less than 0.02.  PT 23.8, INR 2.2.  A 12-lead EKG:  Atrial fibrillation at 82 beats per minute, left axis deviation.   ASSESSMENT AND PLAN:  A 81 year old Caucasian female brought into the ER with severe right knee pain and swelling which has been worse for the past four days and the patient is unable to ambulate today, will be admitted with the  following assessment and plan.  1.  Right knee cellulitis with right knee joint effusion.  Admit her to Med-Surg floor.  Blood cultures were obtained.  The patient is started on IV antibiotics.  Orthopedic consult is placed for arthrocentesis as the patient has a significant joint effusion.  Pain management with morphine.  2.  Insulin-dependent diabetes mellitus.  We will resume her Lantus and only Humalog sliding scale as the patient is ALLERGIC TO NOVOLOG.  3.  Chronic history of congestive heart failure.  The patient is not fluid overloaded.  We will resume her home medication.  4.  Coronary artery disease.  The patient denies any chest pain at this time.  We will resume her home medications including beta blocker and statin.  5.  Chronic atrial fibrillation, rate controlled.  The patient is on Coumadin.  INR is at 2.2.  We will obtain daily PT/INRs and adjust Coumadin dose accordingly.  6.  We will provide her gastrointestinal prophylaxis.  7.  Deep vein thrombosis prophylaxis is not needed as the patient's INR is at 2.2.  8.  CODE STATUS:  SHE IS FULL CODE.  Son is the medical power of attorney.   Plan of  care was discussed in detail with the patient.  She is aware of the plan.   Total time spent on the admission 50 minutes.     ____________________________ Nicholes Mango, MD ag:ea D: 05/07/2013 00:31:19 ET T: 05/07/2013 00:50:19 ET JOB#: [id number]  cc: Nicholes Mango, MD, <Dictator> Nicholes Mango MD ELECTRONICALLY SIGNED 05/25/2013 8:13

## 2014-06-30 ENCOUNTER — Telehealth: Payer: Self-pay

## 2014-06-30 NOTE — Telephone Encounter (Signed)
Would agree, see PMD and urology

## 2014-06-30 NOTE — Telephone Encounter (Signed)
Spoke w/ pt.  She reports increased SOBOE, "I have zero endurance". Reports that her wt is up 2-3 lbs over the past week, she has not been eating right. Pt thinks she has a UTI, h/o cystitis and is taking abx that were previously prescribed by urologist that she did not finish.  She has not been taking lasix due to bladder pain and she did not feel that they were helping.  She reports a fullness across her chest and b/l foot edema; reports she has hard knots in her feet now.  She does not wear compression hose, as they are too difficult to get on and she is concerned that she will hurt her back.  Discussed w/ her at length the purpose & importance of taking her lasix, it's role in her fluid overload and increased urination will help flush bacteria from her bladder, but she states that her bladder is so inflamed, that she is afraid to take it. Advised her to take her lasix today, but she states that she is nauseous and is worried she will not be able to keep it down.  Advised her to contact her PCP regarding her bladder sx, as her urologist if out of the office this week.  She verbalizes understanding but would like to know if Dr. Rockey Situ has any other recommendations or if he would like to do further testing.

## 2014-06-30 NOTE — Telephone Encounter (Signed)
Pt c/o Shortness Of Breath: STAT if SOB developed within the last 24 hours or pt is noticeably SOB on the phone  1. Are you currently SOB (can you hear that pt is SOB on the phone)? Yes, since last week.   2. How long have you been experiencing SOB? For a week  3. Are you SOB when sitting or when up moving around? Moving around, states she has to breathe out of her mouth a lot.  4. Are you currently experiencing any other symptoms? "like my heart is having a hard time"

## 2014-07-01 NOTE — Telephone Encounter (Signed)
Left message on machine for patient to contact the office.   

## 2014-07-01 NOTE — Telephone Encounter (Signed)
Reviewed Dr. Donivan Scull recommendations with patient. Pt verbalized understanding with no further questions.

## 2014-07-02 ENCOUNTER — Other Ambulatory Visit: Payer: Self-pay | Admitting: Family Medicine

## 2014-07-03 ENCOUNTER — Ambulatory Visit: Payer: Self-pay

## 2014-07-03 LAB — PROTIME-INR
INR: 1.7 — ABNORMAL HIGH (ref 0.8–1.2)
PROTHROMBIN TIME: 18 s — AB (ref 9.1–12.0)

## 2014-07-07 ENCOUNTER — Telehealth: Payer: Self-pay

## 2014-07-07 ENCOUNTER — Telehealth: Payer: Self-pay | Admitting: Family Medicine

## 2014-07-07 NOTE — Telephone Encounter (Signed)
Advised patient to increase warfarin to 4mg  daily. Patient scheduled appt for next week for recheck.

## 2014-07-07 NOTE — Telephone Encounter (Signed)
Dr. Rosanna Randy per alscripts patient is on 3 mg even days and 4 mg odd days. What should be the new dose? Please review. Thank you. Please do not close the encounter after routing the message:)-aa     INR slightly low at 1.7--increase warfarin by about 2-3mg  per week.thanks

## 2014-07-07 NOTE — Telephone Encounter (Signed)
Change to 4 nmg daily--PTR 1-2 weeks.

## 2014-07-08 ENCOUNTER — Ambulatory Visit
Admission: RE | Admit: 2014-07-08 | Discharge: 2014-07-08 | Disposition: A | Discharge status: Home / Self Care | Payer: Commercial Managed Care - HMO | Source: Ambulatory Visit | Attending: Physician Assistant | Admitting: Physician Assistant

## 2014-07-08 ENCOUNTER — Ambulatory Visit (INDEPENDENT_AMBULATORY_CARE_PROVIDER_SITE_OTHER): Payer: Commercial Managed Care - HMO

## 2014-07-08 ENCOUNTER — Other Ambulatory Visit: Payer: Self-pay

## 2014-07-08 ENCOUNTER — Encounter: Payer: Self-pay | Admitting: Physician Assistant

## 2014-07-08 ENCOUNTER — Ambulatory Visit (INDEPENDENT_AMBULATORY_CARE_PROVIDER_SITE_OTHER): Payer: Commercial Managed Care - HMO | Admitting: Physician Assistant

## 2014-07-08 VITALS — BP 106/70 | HR 88 | Temp 98.4°F | Resp 18 | Wt 238.4 lb

## 2014-07-08 DIAGNOSIS — F411 Generalized anxiety disorder: Secondary | ICD-10-CM | POA: Insufficient documentation

## 2014-07-08 DIAGNOSIS — I482 Chronic atrial fibrillation, unspecified: Secondary | ICD-10-CM

## 2014-07-08 DIAGNOSIS — G2581 Restless legs syndrome: Secondary | ICD-10-CM | POA: Insufficient documentation

## 2014-07-08 DIAGNOSIS — K219 Gastro-esophageal reflux disease without esophagitis: Secondary | ICD-10-CM | POA: Insufficient documentation

## 2014-07-08 DIAGNOSIS — L409 Psoriasis, unspecified: Secondary | ICD-10-CM | POA: Insufficient documentation

## 2014-07-08 DIAGNOSIS — J45909 Unspecified asthma, uncomplicated: Secondary | ICD-10-CM | POA: Insufficient documentation

## 2014-07-08 DIAGNOSIS — F32A Depression, unspecified: Secondary | ICD-10-CM | POA: Insufficient documentation

## 2014-07-08 DIAGNOSIS — I5032 Chronic diastolic (congestive) heart failure: Secondary | ICD-10-CM | POA: Diagnosis not present

## 2014-07-08 DIAGNOSIS — R0602 Shortness of breath: Secondary | ICD-10-CM

## 2014-07-08 DIAGNOSIS — S161XXA Strain of muscle, fascia and tendon at neck level, initial encounter: Secondary | ICD-10-CM | POA: Insufficient documentation

## 2014-07-08 DIAGNOSIS — J449 Chronic obstructive pulmonary disease, unspecified: Secondary | ICD-10-CM | POA: Insufficient documentation

## 2014-07-08 DIAGNOSIS — I509 Heart failure, unspecified: Secondary | ICD-10-CM | POA: Diagnosis not present

## 2014-07-08 DIAGNOSIS — M1990 Unspecified osteoarthritis, unspecified site: Secondary | ICD-10-CM | POA: Insufficient documentation

## 2014-07-08 DIAGNOSIS — IMO0002 Reserved for concepts with insufficient information to code with codable children: Secondary | ICD-10-CM | POA: Insufficient documentation

## 2014-07-08 DIAGNOSIS — L739 Follicular disorder, unspecified: Secondary | ICD-10-CM | POA: Insufficient documentation

## 2014-07-08 DIAGNOSIS — D649 Anemia, unspecified: Secondary | ICD-10-CM | POA: Insufficient documentation

## 2014-07-08 DIAGNOSIS — R0789 Other chest pain: Secondary | ICD-10-CM | POA: Insufficient documentation

## 2014-07-08 DIAGNOSIS — I481 Persistent atrial fibrillation: Secondary | ICD-10-CM | POA: Diagnosis not present

## 2014-07-08 DIAGNOSIS — M5137 Other intervertebral disc degeneration, lumbosacral region: Secondary | ICD-10-CM | POA: Insufficient documentation

## 2014-07-08 DIAGNOSIS — I4819 Other persistent atrial fibrillation: Secondary | ICD-10-CM

## 2014-07-08 DIAGNOSIS — F329 Major depressive disorder, single episode, unspecified: Secondary | ICD-10-CM | POA: Insufficient documentation

## 2014-07-08 DIAGNOSIS — S8290XA Unspecified fracture of unspecified lower leg, initial encounter for closed fracture: Secondary | ICD-10-CM | POA: Insufficient documentation

## 2014-07-08 DIAGNOSIS — G4700 Insomnia, unspecified: Secondary | ICD-10-CM | POA: Insufficient documentation

## 2014-07-08 DIAGNOSIS — J189 Pneumonia, unspecified organism: Secondary | ICD-10-CM | POA: Insufficient documentation

## 2014-07-08 DIAGNOSIS — L03019 Cellulitis of unspecified finger: Secondary | ICD-10-CM | POA: Insufficient documentation

## 2014-07-08 DIAGNOSIS — G4733 Obstructive sleep apnea (adult) (pediatric): Secondary | ICD-10-CM | POA: Insufficient documentation

## 2014-07-08 DIAGNOSIS — R609 Edema, unspecified: Secondary | ICD-10-CM | POA: Insufficient documentation

## 2014-07-08 DIAGNOSIS — E039 Hypothyroidism, unspecified: Secondary | ICD-10-CM | POA: Insufficient documentation

## 2014-07-08 LAB — POCT INR
INR: 2.4
PT: 29.1

## 2014-07-08 NOTE — Progress Notes (Signed)
   Subjective:    Patient ID: Tracy Mann, female    DOB: Sep 24, 1933, 81 y.o.   MRN: [id number]  Shortness of Breath This is a new problem. The current episode started 1 to 4 weeks ago. The problem occurs constantly. The problem has been gradually worsening. Associated symptoms include chest pain, orthopnea and wheezing. Pertinent negatives include no abdominal pain, claudication, coryza, ear pain, fever, headaches, hemoptysis, leg pain, leg swelling, neck pain, rash, rhinorrhea, sore throat, sputum production, swollen glands, syncope or vomiting. The symptoms are aggravated by any activity and lying flat. Risk factors: sedentary lifestyle. She has tried body position changes and rest for the symptoms. The treatment provided mild relief. Her past medical history is significant for CAD, a heart failure and pneumonia (December 2016). There is no history of allergies, aspirin allergies, asthma, bronchiolitis, chronic lung disease, COPD, DVT, PE or a recent surgery.      Review of Systems  Constitutional: Positive for activity change and fatigue. Negative for fever and chills.  HENT: Negative for congestion, ear pain, rhinorrhea and sore throat.   Respiratory: Positive for chest tightness, shortness of breath and wheezing. Negative for cough, hemoptysis and sputum production.   Cardiovascular: Positive for chest pain, palpitations and orthopnea. Negative for claudication, leg swelling and syncope.  Gastrointestinal: Negative for nausea, vomiting, abdominal pain, diarrhea and constipation.  Musculoskeletal: Negative for neck pain.  Skin: Negative for color change and rash.  Neurological: Negative for dizziness, syncope, weakness and headaches.  Hematological: Does not bruise/bleed easily.       Objective:   Physical Exam  Constitutional: She appears well-developed and well-nourished. She appears distressed.  Neck: Normal range of motion. Neck supple. No JVD present. No tracheal deviation  present.  Cardiovascular: Normal rate and normal heart sounds.  An irregularly irregular rhythm present. Exam reveals no gallop and no friction rub.   No murmur heard. Pulmonary/Chest: Tachypnea noted. She is in respiratory distress. She has wheezes (throughout).  Abdominal: Soft. Bowel sounds are normal. She exhibits no distension and no mass. There is no tenderness. There is no rebound and no guarding.  Lymphadenopathy:    She has no cervical adenopathy.  Skin: Skin is warm and dry. She is not diaphoretic.          Assessment & Plan:  1. Shortness of breath Worsening over last 3 weeks.  Pt refuses to take lasix due to bladder irritation.  Worried she has had a silent heart attack.  EKG was stable from previous indicating persistent, rate controlled atrial fibrillation with rare PVC.  No acute MI noted.  Will obtain CXR to R/O pnuemonia.  Will follow through with cardiac causes first then if negative proceed with pulm work up.    - EKG 12-Lead - DG Chest 2 View; Future - Ambulatory referral to Cardiology  2. Persistent atrial fibrillation Stable and rate controlled.  - Ambulatory referral to Cardiology  3. DIASTOLIC HEART FAILURE, CHRONIC Worsening SOB x 3 weeks.  11 lb weight gain since February 2016.  Pt refuses lasix due to bladder irritation.  Will R/O cardiac causes of SOB and then follow through with pulmonary if needed. - Ambulatory referral to Cardiology

## 2014-07-08 NOTE — Patient Instructions (Signed)

## 2014-07-08 NOTE — Progress Notes (Signed)
PT came in for a PT/INR check.  PT 29.1 INR 2.4  Instructed pt to continue same dose (warfarin 4mg ) Daily and to come back in 4 weeks for a recheck PT/INR.

## 2014-07-09 ENCOUNTER — Telehealth: Payer: Self-pay

## 2014-07-09 ENCOUNTER — Encounter: Payer: Self-pay | Admitting: Nurse Practitioner

## 2014-07-09 ENCOUNTER — Ambulatory Visit (INDEPENDENT_AMBULATORY_CARE_PROVIDER_SITE_OTHER): Payer: Commercial Managed Care - HMO | Admitting: Nurse Practitioner

## 2014-07-09 VITALS — BP 110/62 | HR 61 | Ht 65.0 in | Wt 238.0 lb

## 2014-07-09 DIAGNOSIS — I5043 Acute on chronic combined systolic (congestive) and diastolic (congestive) heart failure: Secondary | ICD-10-CM | POA: Diagnosis not present

## 2014-07-09 DIAGNOSIS — I5042 Chronic combined systolic (congestive) and diastolic (congestive) heart failure: Secondary | ICD-10-CM | POA: Insufficient documentation

## 2014-07-09 DIAGNOSIS — E782 Mixed hyperlipidemia: Secondary | ICD-10-CM

## 2014-07-09 DIAGNOSIS — E119 Type 2 diabetes mellitus without complications: Secondary | ICD-10-CM | POA: Diagnosis not present

## 2014-07-09 DIAGNOSIS — I428 Other cardiomyopathies: Secondary | ICD-10-CM | POA: Insufficient documentation

## 2014-07-09 DIAGNOSIS — I429 Cardiomyopathy, unspecified: Secondary | ICD-10-CM | POA: Diagnosis not present

## 2014-07-09 DIAGNOSIS — Z7901 Long term (current) use of anticoagulants: Secondary | ICD-10-CM | POA: Insufficient documentation

## 2014-07-09 DIAGNOSIS — I482 Chronic atrial fibrillation, unspecified: Secondary | ICD-10-CM | POA: Insufficient documentation

## 2014-07-09 DIAGNOSIS — I10 Essential (primary) hypertension: Secondary | ICD-10-CM

## 2014-07-09 NOTE — Patient Instructions (Addendum)
Start taking Lasix 20 mg take two tablet daily for 5 days then take as needed only for swelling.    BMET today.  Follow up with Christell Faith, PA next week.   Your physician has requested that you have an echocardiogram. Echocardiography is a painless test that uses sound waves to create images of your heart. It provides your doctor with information about the size and shape of your heart and how well your heart's chambers and valves are working. This procedure takes approximately one hour. There are no restrictions for this procedure.

## 2014-07-09 NOTE — Telephone Encounter (Signed)
-----   Message from Mar Daring, PA-C sent at 07/08/2014  4:52 PM EDT ----- No pneumonia noted on CXR.  COPD and low grade congestive heart failure noted.  Follow through with cardiology appointment.  May refer to pulmonology also for better control of COPD after cardiology workup. Thanks! -JB

## 2014-07-09 NOTE — Progress Notes (Signed)
Patient Name: Tracy Mann Date of Encounter: 07/09/2014  Primary Care Provider:  Wilhemena Durie, MD Primary Cardiologist:  Johnny Bridge, MD   Chief Complaint  81 y/o female with a h/o afib, syst/diast chf, and DM, who presents today with a 1-2 month h/o progressive dyspnea.  Past Medical History   Past Medical History  Diagnosis Date  . Chronic atrial fibrillation     a. CHA2DS2VASc = 6--> warfarin.  . Chronic combined systolic and diastolic CHF (congestive heart failure)     a. 10/2012 Echo: EF 35-40%, diff HK, mild MR, mild biatrial enlargement;  b. 11/2012 EF 45-50% by LV gram.  . Chronic anticoagulation     a. warfarin.  . Acute myocardial infarction of inferior wall     a. Presumed to be 2/2 to a coronary embolus in setting of persistent Afib-->Cath relatively nl cors, EF 45-50% w/ severe distal inferior/apical inferior HK w/ aneursymal appearance.  . Essential hypertension   . Obesity   . Hyperlipidemia, mixed   . Type II diabetes mellitus   . Mixed Ischemic/Nonischemic Cardiomyopathy     a. 10/2012 Echo: EF 35-40%;  b. 11/2012 EF 45-50% by LV gram.   Past Surgical History  Procedure Laterality Date  . Vesicovaginal fistula closure w/ tah  1996  . Tubal ligation  1968  . Cholecystectomy  1999  . Cardiac catheterization  11/2012    [facility];EF 45-50%    Allergies  Allergies  Allergen Reactions  . Clarithromycin     Hives, headaches, hard time swelling, felt like throat was closing up  . Diphenhydramine   . Estrogens Conjugated   . Sulfonamide Derivatives Swelling    'tongue swelling'    HPI  81 y/o female with the above complex problem list.  She is s/p MI in 11/2012 with cath revealing relatively nl cors and EF of 45-50% (was 35-40% by echo a month earlier).  Event was felt to be emoblic in nature in the setting of afib.  She has been on coumadin since.  Over the past 1-2 mos, she has noted progressive DOE, LEE, increasing abd girth, and orthopnea - with an  increase from 3 pillow to 4.  She does not weigh herself regularly, but according to our records, she is up about 11 lbs since February.  She has metolazone and lasix 20 @ use to be used prn for volume overload, but she prefers not to use them b/c of a h/o interstitial cystitis with resultant dysuria r/t frequent urination when taking lasix.  Instead, when she noted volume overload, she cuts back her Na intake for a few days - w/o much improvement in volume status or Ss.  She was seen by her PCP yesterday with the above complaints and a CXR was performed showing copd and mild CHF.  She denies pnd, dizziness, syncope, chest pain, or early satiety.  Home Medications  Prior to Admission medications   Medication Sig Start Date End Date Taking? Authorizing Provider  COMBIVENT RESPIMAT 20-100 MCG/ACT AERS respimat 1 puff every 6 (six) hours as needed.  07/03/14  Yes Historical Provider, MD  fluticasone (FLONASE) 50 MCG/ACT nasal spray 2 (TWO) SPRAYS INTRANASALLY SPRAYS INTRANASALLY, NASAL, DAILY 06/01/14  Yes Historical Provider, MD  furosemide (LASIX) 20 MG tablet Take 1 tablet (20 mg total) by mouth 2 (two) times daily as needed. 11/26/12  Yes Minna Merritts, MD  HYDROcodone-acetaminophen (NORCO) 10-325 MG per tablet Take 1 tablet by mouth as needed.  Yes Historical Provider, MD  insulin glargine (LANTUS) 100 UNIT/ML injection Inject 22 Units into the skin at bedtime.   Yes Historical Provider, MD  insulin lispro (HUMALOG) 100 UNIT/ML KiwkPen Inject up to 10 units three times daily before each meal as directed 10/23/13  Yes Historical Provider, MD  Levothyroxine Sodium 88 MCG CAPS Take 88 mcg by mouth daily.   Yes Historical Provider, MD  lisinopril (PRINIVIL,ZESTRIL) 2.5 MG tablet Take 1 tablet (2.5 mg total) by mouth daily. 08/11/13  Yes Minna Merritts, MD  LORazepam (ATIVAN) 1 MG tablet Take 1 mg by mouth 2 (two) times daily as needed.     Yes Historical Provider, MD  magnesium sulfate 50 % injection  Take by mouth.   Yes Historical Provider, MD  metolazone (ZAROXOLYN) 5 MG tablet TAKE 1 TABLET BY MOUTH TWICE A DAY AS NEEDED 01/01/14  Yes Minna Merritts, MD  metoprolol succinate (TOPROL-XL) 25 MG 24 hr tablet Take 1 tablet (25 mg total) by mouth 2 (two) times daily. 10/21/13  Yes Minna Merritts, MD  nitroGLYCERIN (NITROSTAT) 0.4 MG SL tablet Place 1 tablet (0.4 mg total) under the tongue every 5 (five) minutes as needed. May repeat for up to 3 doses. 08/05/10  Yes Minna Merritts, MD  omeprazole (PRILOSEC) 20 MG capsule Take 20 mg by mouth daily. 05/23/14  Yes Historical Provider, MD  phenazopyridine (PYRIDIUM) 100 MG tablet Take 100 mg by mouth as needed.     Yes Historical Provider, MD  warfarin (COUMADIN) 3 MG tablet Take 3 mg by mouth daily.   Yes Historical Provider, MD  ZETIA 10 MG tablet Take 1 tablet by mouth Daily. 02/07/11  Yes Historical Provider, MD    Review of Systems  Progressive DOE, orthopnea, edema, and increasing abd girth over the past month to two months.  All other systems reviewed and are otherwise negative except as noted above.  Physical Exam  VS:  BP 110/62 mmHg  Pulse 61  Ht 5\' 5"  (1.651 m)  Wt 238 lb (107.956 kg)  BMI 39.61 kg/m2 , BMI Body mass index is 39.61 kg/(m^2). GEN: Well nourished, well developed, in no acute distress. HEENT: normal. Neck: Supple, no carotid bruits, or masses.  JVP ~ 12 cm. Cardiac: IR, IR, no murmurs, rubs, or gallops. No clubbing, cyanosis.  1+ bilat LE edema to the knees with varicosities of bilat ankles.  Radials/DP/PT 2+ and equal bilaterally.  Respiratory:  Respirations regular and unlabored, clear to auscultation bilaterally. GI: Soft, nontender, nondistended, BS + x 4. MS: no deformity or atrophy. Skin: warm and dry, no rash. Neuro:  Strength and sensation are intact. Psych: Normal affect.  Accessory Clinical Findings  ECG - afib, 61, ivcd, inf infarct/left axis, poor r progression - no acute st/t changes.  Assessment  & Plan  1.  Acute on chronic combined systolic/diastolic CHF/Mixed ischemic/non-ischemic CM:  Pt has prior h/o mild to mod LV dysfxn by echo and LV gram in 10 and 11/2012 respectively.  Over the past 1-2 mos, she has been having progressive DOE, orthopnea, inc abd girth, and LEE.  She has been reluctant to take lasix prn 2/2 h/o interstitial cystitis and dysuria that occurs when she has polyuria.  CXR yesterday shows mild CHF.  She has evidence of volume overload on exam as well.  We discussed the hemodynamics associated with CHF and how excess volume contributes to dyspnea.  She is willing to take lasix at this point, though would like to limit  her course if at all possible.  I've asked her to take lasix 40 mg daily for the next 5 days.  After that, she can take 20 mg daily prn for wt gain > 2 lbs in 1 day or 5 lbs over the course of the week.  We discussed the importance of daily weights, sodium restriction, medication compliance, and symptom reporting and she verbalizes understanding.  I"ll check a BMET today and will repeat a 2d echo to eval LV fxn.  If EF markedly diminished, will need to consider ischemic evaluation.  Cont bb/acei.  We'll see her back next week.  2.  Persistent Afib:  Rate controlled on bb.  Cont coumadin for CHA2DS2VASc = 6.  3.  Essential HTN:  Stable.  4.  HL:  Cont zetia.  5.  DMII:  Per IM.  6.  Dispo: bmet today.  Schedule echo.  Diurese and f/u in 1 week.    Murray Hodgkins, NP 07/09/2014, 4:04 PM

## 2014-07-09 NOTE — Telephone Encounter (Signed)
Patient advised as directed below. Patient verbalized understanding. Patient states she has a appointment with Cardiology today at 2:30 pm.

## 2014-07-10 ENCOUNTER — Other Ambulatory Visit: Payer: Self-pay

## 2014-07-10 ENCOUNTER — Ambulatory Visit (INDEPENDENT_AMBULATORY_CARE_PROVIDER_SITE_OTHER): Payer: Commercial Managed Care - HMO

## 2014-07-10 DIAGNOSIS — I5043 Acute on chronic combined systolic (congestive) and diastolic (congestive) heart failure: Secondary | ICD-10-CM

## 2014-07-10 DIAGNOSIS — I429 Cardiomyopathy, unspecified: Secondary | ICD-10-CM | POA: Diagnosis not present

## 2014-07-10 DIAGNOSIS — I428 Other cardiomyopathies: Secondary | ICD-10-CM

## 2014-07-10 LAB — BASIC METABOLIC PANEL
BUN/Creatinine Ratio: 30 — ABNORMAL HIGH (ref 11–26)
BUN: 20 mg/dL (ref 8–27)
CO2: 19 mmol/L (ref 18–29)
CREATININE: 0.67 mg/dL (ref 0.57–1.00)
Calcium: 8.7 mg/dL (ref 8.7–10.3)
Chloride: 101 mmol/L (ref 97–108)
GFR calc Af Amer: 97 mL/min/{1.73_m2} (ref >59)
GFR, EST NON AFRICAN AMERICAN: 84 mL/min/{1.73_m2} (ref >59)
GLUCOSE: 130 mg/dL — AB (ref 65–99)
Potassium: 4.3 mmol/L (ref 3.5–5.2)
Sodium: 139 mmol/L (ref 134–144)

## 2014-07-13 ENCOUNTER — Encounter: Payer: Self-pay | Admitting: Physician Assistant

## 2014-07-14 ENCOUNTER — Encounter: Payer: Self-pay | Admitting: Physician Assistant

## 2014-07-14 ENCOUNTER — Ambulatory Visit (INDEPENDENT_AMBULATORY_CARE_PROVIDER_SITE_OTHER): Payer: Commercial Managed Care - HMO | Admitting: Physician Assistant

## 2014-07-14 ENCOUNTER — Telehealth: Payer: Self-pay | Admitting: *Deleted

## 2014-07-14 VITALS — BP 120/60 | HR 92 | Ht 65.0 in | Wt 227.2 lb

## 2014-07-14 DIAGNOSIS — I482 Chronic atrial fibrillation, unspecified: Secondary | ICD-10-CM

## 2014-07-14 DIAGNOSIS — I10 Essential (primary) hypertension: Secondary | ICD-10-CM | POA: Diagnosis not present

## 2014-07-14 DIAGNOSIS — I4891 Unspecified atrial fibrillation: Secondary | ICD-10-CM

## 2014-07-14 DIAGNOSIS — E119 Type 2 diabetes mellitus without complications: Secondary | ICD-10-CM

## 2014-07-14 DIAGNOSIS — I5042 Chronic combined systolic (congestive) and diastolic (congestive) heart failure: Secondary | ICD-10-CM | POA: Diagnosis not present

## 2014-07-14 DIAGNOSIS — I429 Cardiomyopathy, unspecified: Secondary | ICD-10-CM

## 2014-07-14 DIAGNOSIS — E785 Hyperlipidemia, unspecified: Secondary | ICD-10-CM

## 2014-07-14 DIAGNOSIS — Z7901 Long term (current) use of anticoagulants: Secondary | ICD-10-CM

## 2014-07-14 DIAGNOSIS — I428 Other cardiomyopathies: Secondary | ICD-10-CM

## 2014-07-14 MED ORDER — FUROSEMIDE 20 MG PO TABS: 20.0000 mg | ORAL_TABLET | Freq: Every day | ORAL | Status: DC

## 2014-07-14 NOTE — Telephone Encounter (Signed)
Please call [facility] regarding  Furosimide 20mg . Patient is allergic to loop diaretic. Please call Pharmacist Bimpe # [phone]

## 2014-07-14 NOTE — Telephone Encounter (Signed)
Per Christell Faith, PA ok to fill prescription as patient has been taking lasix. S/w pharmacy regarding Ryan's instructions.

## 2014-07-14 NOTE — Patient Instructions (Addendum)
Medication Instructions:  Your physician has recommended you make the following change in your medication:  1) START taking lasix 20mg  (1 tablet) daily AS NEEDED for weight gain greater than 2lbs in a day or 5 lbs in a week 2) STOP taking metolazone   Labwork: Your physician recommends that you have labs today: BMET, TSH   Testing/Procedures: none  Follow-Up: Your physician wants you to follow-up in: six months with Dr. Rockey Situ. You will receive a reminder letter in the mail two months in advance. If you don't receive a letter, please call our office to schedule the follow-up appointment.   Any Other Special Instructions Will Be Listed Below (If Applicable).  Limit fluids to 1.5-2L per day Stop eating out.

## 2014-07-14 NOTE — Progress Notes (Signed)
Cardiology Office Note:  Date of Encounter: 07/14/2014  ID: Tracy Mann, DOB 06-30-1933, MRN [id number]  PCP:  Wilhemena Durie, MD Primary Cardiologist:  Dr. Rockey Situ, MD  Chief Complaint  Patient presents with  . other    1 week follow up as well as discuss echo results. Patient was taking Lasix 20 mg twice a day for five days, has not had Lasix today. Meds reviewed by the patient verbally.     HPI:  81 y/o female with a h/o chronic a-fib on warfarin, chronic combined systolic and diastolic CHF, and DM who presents today for follow up of a 1-2 month history of progressive dyspnea.   She was recently seen by Murray Hodgkins, NP on 07/09/2014 for the above.   She is s/p MI in 11/2012 with cardiac cath revealing relatively normal coronary arteries and EF of 45-50% (was 35-40% by echo a month earlier).Event was felt to be emoblic in nature in the setting of a-fib. She has been on Coumadin since.Over the past 1-2 months, she had noted progressive DOE, LEE, increasing abdominal girth, and orthopnea - with an increase from 3 pillows to 4. She was not weighing herself regularly, but according to our records, she was up about 11 lbs since February.She had metolazone and Lasix 20 mg to be used prn for volume overload, but she prefered not to use them because of a history of interstitial cystitis with resultant dysuria related to frequent urination when taking Lasix.Instead, when she noted volume overload, she would previously cut back her Na intake for a few days - without much improvement in volume status or symptoms.She was seen by her PCP on 6/8 with the above complaints and a CXR done which showed COPD and mild CHF. She followed up with cardiology on 6/9 and was started on Lasix 40 mg daily x 5 days, followed by Lasix 20 mg prn for weight gain > 2 pounds in 1 day or 5 lbs over the course of the week. Echo was checked which showed improved LV function of 40-45% (image quality was poor), mild  concentric LVH, moderate MR, severely dilated left atrium, right ventricle mildly dilated, right atrium severely dilated, PASP 45 mm Hg.   She feels much better today. She is no longer SOB. No orthopnea, PND, and her lower extremity edema has resolved. No chest pain or palpitations. She has completed her 5 days of Lasix and has not taken any today. She states she would prefer to not take any Lasix as this seems to irritate her interstitial cystitis. She does eat out at restaurants quite frequently and enjoys cold cut meats often. She goes through periods where she will watch her po fluid intake then other times she will not watch her consumption at all and drink without regard.     Past Medical History  Diagnosis Date  . Chronic atrial fibrillation     a. CHA2DS2VASc = 6--> warfarin.  . Chronic combined systolic and diastolic CHF (congestive heart failure)     a. 10/2012 Echo: EF 35-40%, diff HK, mild MR, mild biatrial enlargement;  b. 11/2012 EF 45-50% by LV gram; c. echo 07/2014: EF 40-45%, mod conc LVH, mod MR, severe biatrial enlargement, PASP 45 mm Hg  . Chronic anticoagulation     a. warfarin.  . Acute myocardial infarction of inferior wall     a. Presumed to be 2/2 to a coronary embolus in setting of persistent Afib-->Cath relatively nl cors, EF 45-50% w/ severe distal  inferior/apical inferior HK w/ aneursymal appearance.  . Essential hypertension   . Obesity   . Hyperlipidemia, mixed   . Type II diabetes mellitus   . Mixed Ischemic/Nonischemic Cardiomyopathy     a. 10/2012 Echo: EF 35-40%;  b. 11/2012 EF 45-50% by LV gram.  :  Past Surgical History  Procedure Laterality Date  . Vesicovaginal fistula closure w/ tah  1996  . Tubal ligation  1968  . Cholecystectomy  1999  . Cardiac catheterization  11/2012    [facility];EF 45-50%  :  Social History:  The patient  reports that she has never smoked. She has never used smokeless tobacco. She reports that she does not drink alcohol or use  illicit drugs.   Family History  Problem Relation Age of Onset  . Heart disease Mother      Allergies:  Allergies  Allergen Reactions  . Clarithromycin     Hives, headaches, hard time swelling, felt like throat was closing up  . Diphenhydramine   . Estrogens Conjugated   . Sulfonamide Derivatives Swelling    'tongue swelling'     Home Medications:  Current Outpatient Prescriptions  Medication Sig Dispense Refill  . COMBIVENT RESPIMAT 20-100 MCG/ACT AERS respimat 1 puff every 6 (six) hours as needed.     . fluticasone (FLONASE) 50 MCG/ACT nasal spray 2 (TWO) SPRAYS INTRANASALLY SPRAYS INTRANASALLY, NASAL, DAILY  12  . furosemide (LASIX) 20 MG tablet Take 1 tablet (20 mg total) by mouth 2 (two) times daily as needed. 90 tablet 3  . HYDROcodone-acetaminophen (NORCO) 10-325 MG per tablet Take 1 tablet by mouth as needed.      . insulin glargine (LANTUS) 100 UNIT/ML injection Inject 22 Units into the skin at bedtime.    . insulin lispro (HUMALOG) 100 UNIT/ML KiwkPen Inject up to 10 units three times daily before each meal as directed    . Levothyroxine Sodium 88 MCG CAPS Take 88 mcg by mouth daily.    Marland Kitchen lisinopril (PRINIVIL,ZESTRIL) 2.5 MG tablet Take 1 tablet (2.5 mg total) by mouth daily. 90 tablet 1  . LORazepam (ATIVAN) 1 MG tablet Take 1 mg by mouth 2 (two) times daily as needed.      . magnesium sulfate 50 % injection Take by mouth.    . metolazone (ZAROXOLYN) 5 MG tablet TAKE 1 TABLET BY MOUTH TWICE A DAY AS NEEDED 90 tablet 1  . metoprolol succinate (TOPROL-XL) 25 MG 24 hr tablet Take 1 tablet (25 mg total) by mouth 2 (two) times daily. 180 tablet 3  . nitroGLYCERIN (NITROSTAT) 0.4 MG SL tablet Place 1 tablet (0.4 mg total) under the tongue every 5 (five) minutes as needed. May repeat for up to 3 doses. 90 tablet 3  . omeprazole (PRILOSEC) 20 MG capsule Take 20 mg by mouth daily.  3  . phenazopyridine (PYRIDIUM) 100 MG tablet Take 100 mg by mouth as needed.      . warfarin  (COUMADIN) 3 MG tablet Take 3 mg by mouth daily.    Marland Kitchen ZETIA 10 MG tablet Take 1 tablet by mouth Daily.     No current facility-administered medications for this visit.     Review of Systems:  Review of Systems  Constitutional: Positive for weight loss. Negative for fever, chills, malaise/fatigue and diaphoresis.       Weight loss through diuresis of 11 pounds since office visit on 6/9 (238-->227)  HENT: Negative for congestion.   Eyes: Negative for blurred vision, photophobia, pain, discharge and  redness.  Respiratory: Negative for cough, hemoptysis, sputum production, shortness of breath and wheezing.   Cardiovascular: Negative for chest pain, palpitations, orthopnea, claudication, leg swelling and PND.  Gastrointestinal: Negative for heartburn, nausea and vomiting.  Genitourinary: Positive for dysuria, urgency and frequency. Negative for hematuria and flank pain.  Musculoskeletal: Negative for myalgias and falls.  Skin: Negative for itching and rash.  Neurological: Negative for dizziness, speech change, weakness and headaches.  Endo/Heme/Allergies: Does not bruise/bleed easily.  Psychiatric/Behavioral: Negative for substance abuse. The patient is not nervous/anxious.   All other systems reviewed and are negative.    Physical Exam:  Blood pressure 120/60, pulse 92, height 5\' 5"  (1.651 m), weight 227 lb 4 oz (103.08 kg). Body mass index is 37.82 kg/(m^2). General: Pleasant, NAD. Psych: Normal affect. Responds to questions with normal affect.  Neuro: Alert and oriented X 3. Moves all extremities spontaneously. HEENT: Normocephalic, atraumatic. Sclera anicteric. EOM intact bilaterally.   Neck: Supple without bruits or JVD. Lungs:  Respirations regular and unlabored, CTA bilaterally.  Heart: RRR, s3, s4. II/VI systolic murmurs at the apex. No rubs or gallops.  Abdomen: Soft, non-tender, non-distended, BS + x 4.  Extremities: No clubbing, cyanosis or edema. DP/PT/Radials 2+ and  equal bilaterally.   Accessory Clinical Findings:  EKG - study not done in office today.  Other studies Reviewed: Additional studies/ records that were reviewed today include: prior office notes.   Recent Labs: 01/23/2014: HGB 11.2*; Platelet 232 07/09/2014: BUN 20; Creatinine, Ser 0.67; Potassium 4.3; Sodium 139    Lipid Panel No results found for: CHOL, TRIG, HDL, CHOLHDL, VLDL, LDLCALC, LDLDIRECT   Weights: Wt Readings from Last 3 Encounters:  07/14/14 227 lb 4 oz (103.08 kg)  07/09/14 238 lb (107.956 kg)  07/08/14 238 lb 6.4 oz (108.138 kg)     Assessment & Plan:  1. Acute on chronic combined systolic and diastolic CHF:  -Much improved -Weight loss of 11 pounds since her office visit on 07/09/2014 -Continue lisinopril 2.5 mg daily and Toprol XL 25 mg bid  -Lasix 20 mg prn weight gain >2 pounds overnight or 5 pounds in 1 week -Limit po fluid intake to less than 1.5 to 2L daily -Limit salt intake (she eats out at restaurants several times weekly and enjoys cold cut meats) -Not currently a candidate for Entresto given LVEF of 45%  2. Chronic a-fib:  -Rate controlled on Toprol XL 25 mg bid -Continue warfarin  -CHADSVASc at least 7 giving her an estimated annual stroke risk of 9.6%  3. HTN:  -Well controlled   4. HLD:  -Continue Zetia   5. DM2:  -Per IM   6. Interstitial cystitis: -Limits her ability to take multiple daily medications, including Lasix  -Stable  -Followed by Urology    Dispo: -Follow up with Dr. Rockey Situ in 6 months, sooner if needed   Christell Faith, PA-C [place], [place] [address] [phone] [facility] 07/14/2014, 2:54 PM

## 2014-07-15 LAB — BASIC METABOLIC PANEL
BUN / CREAT RATIO: 30 — AB (ref 11–26)
BUN: 22 mg/dL (ref 8–27)
CALCIUM: 9.5 mg/dL (ref 8.7–10.3)
CO2: 23 mmol/L (ref 18–29)
Chloride: 97 mmol/L (ref 97–108)
Creatinine, Ser: 0.73 mg/dL (ref 0.57–1.00)
GFR calc non Af Amer: 79 mL/min/{1.73_m2} (ref >59)
GFR, EST AFRICAN AMERICAN: 91 mL/min/{1.73_m2} (ref >59)
GLUCOSE: 142 mg/dL — AB (ref 65–99)
Potassium: 4.3 mmol/L (ref 3.5–5.2)
Sodium: 141 mmol/L (ref 134–144)

## 2014-07-15 LAB — TSH: TSH: 1.34 u[IU]/mL (ref 0.450–4.500)

## 2014-07-22 ENCOUNTER — Ambulatory Visit (INDEPENDENT_AMBULATORY_CARE_PROVIDER_SITE_OTHER): Payer: Commercial Managed Care - HMO

## 2014-07-22 DIAGNOSIS — I482 Chronic atrial fibrillation, unspecified: Secondary | ICD-10-CM

## 2014-07-22 LAB — POCT INR
INR: 3.9
PT: 47.2

## 2014-07-22 NOTE — Telephone Encounter (Signed)
PT reviewed

## 2014-07-22 NOTE — Patient Instructions (Signed)
Take Coumadin 3 mg daily. Return in 1 week for PT/INR. Call if any questions or concerns.

## 2014-07-27 ENCOUNTER — Telehealth: Payer: Self-pay | Admitting: *Deleted

## 2014-07-27 DIAGNOSIS — I10 Essential (primary) hypertension: Secondary | ICD-10-CM

## 2014-07-27 MED ORDER — FUROSEMIDE 20 MG PO TABS: 20.0000 mg | ORAL_TABLET | Freq: Every day | ORAL | Status: DC

## 2014-07-27 NOTE — Telephone Encounter (Signed)
  1. Which medications need to be refilled? Furosimide 20mg   2. Which pharmacy is medication to be sent to? [facility]  3. Do they need a 30 day or 90 day supply? 30 day   4. Would they like a call back once the medication has been sent to the pharmacy? Yes  Patient requesting that directions change to twice a day.

## 2014-07-27 NOTE — Telephone Encounter (Signed)
Refill sent for furosemide  

## 2014-07-29 ENCOUNTER — Ambulatory Visit (INDEPENDENT_AMBULATORY_CARE_PROVIDER_SITE_OTHER): Payer: Commercial Managed Care - HMO

## 2014-07-29 DIAGNOSIS — I482 Chronic atrial fibrillation, unspecified: Secondary | ICD-10-CM

## 2014-07-29 LAB — POCT INR
INR: 3.4
PT: 40.3

## 2014-07-29 NOTE — Patient Instructions (Signed)
PT/INR INR 3.4  Hold Warfarin today and tomorrow. Restart warfarin 3mg  Daily and come back in 3 weeks for PT/INR check. Call if any questions or concerns.

## 2014-08-12 ENCOUNTER — Ambulatory Visit: Payer: Commercial Managed Care - HMO | Admitting: Family Medicine

## 2014-08-12 ENCOUNTER — Telehealth: Payer: Self-pay | Admitting: Family Medicine

## 2014-08-12 ENCOUNTER — Other Ambulatory Visit: Payer: Self-pay

## 2014-08-12 MED ORDER — WARFARIN SODIUM 3 MG PO TABS: 3.0000 mg | ORAL_TABLET | Freq: Every day | ORAL | Status: DC

## 2014-08-12 NOTE — Telephone Encounter (Signed)
warfarin (COUMADIN) 3 MG tablet  Pt waited too late to send into mail order. Please send to [facility].  Thanks, TP

## 2014-08-18 ENCOUNTER — Telehealth: Payer: Self-pay | Admitting: Cardiovascular Disease

## 2014-08-18 NOTE — Telephone Encounter (Signed)
Pt c/o swelling: STAT is pt has developed SOB within 24 hours  1. How long have you been experiencing swelling?  Cant remember when first started  Last 2-3 weeks has been getting worse   2. Where is the swelling located? Stomach, hands, pitted edema in legs at times   3.  Are you currently taking a "fluid pill"? Yes taking one everyday planning on taking extra one today as she is sob   4.  Are you currently SOB? Yes continued since last visit worsens with salt intake and exertion wheezing on exertion   5.  Have you traveled recently? No    Please call patient as she is very concerned her condition is getting worse and needs to be seen has also had weight gain everyday and needs prn fluid pill everyday thinks she needs to take two.

## 2014-08-18 NOTE — Telephone Encounter (Signed)
S/w pt regarding Dr. Elmarie Shiley recommendations.  Informed pt to weigh daily and keep written log and to notify us if she continues to gain weight. Pt verbalized understanding with no further questions.

## 2014-08-18 NOTE — Telephone Encounter (Signed)
Ok to increase her lasix does for 3 days and then I would go back to the regular prescribed dose.

## 2014-08-18 NOTE — Telephone Encounter (Signed)
S/w pt who indicates she has had swelling in hands, abdomen, feet, and ankles since June 14 OV but feels like she has been going down hill since then.  States she has been cutting back on salt. Has ginger ale once a day.  States she has no energy. Was weighing herself but lost book. Did not weigh herself today and can't remember what she weighed yesterday. Pt can carry on conversation without sounding short of breath. States oxygen level is 93% States she is taking 20mg  lasix once per day but will take two today. Would like to know if she can increase lasix dosage.  Reviewed low sodium foods, limiting fluid intake, and elevating feet. Will forward to Dr. Acie Fredrickson for review

## 2014-08-19 ENCOUNTER — Ambulatory Visit: Payer: Commercial Managed Care - HMO

## 2014-08-20 ENCOUNTER — Encounter: Payer: Self-pay | Admitting: Family Medicine

## 2014-08-20 ENCOUNTER — Ambulatory Visit (INDEPENDENT_AMBULATORY_CARE_PROVIDER_SITE_OTHER): Payer: Commercial Managed Care - HMO | Admitting: Family Medicine

## 2014-08-20 VITALS — BP 128/62 | HR 72 | Temp 98.7°F | Resp 18 | Ht 63.75 in | Wt 235.0 lb

## 2014-08-20 DIAGNOSIS — I119 Hypertensive heart disease without heart failure: Secondary | ICD-10-CM | POA: Diagnosis not present

## 2014-08-20 DIAGNOSIS — I482 Chronic atrial fibrillation, unspecified: Secondary | ICD-10-CM

## 2014-08-20 DIAGNOSIS — R0602 Shortness of breath: Secondary | ICD-10-CM | POA: Diagnosis not present

## 2014-08-20 DIAGNOSIS — M150 Primary generalized (osteo)arthritis: Secondary | ICD-10-CM | POA: Diagnosis not present

## 2014-08-20 DIAGNOSIS — E669 Obesity, unspecified: Secondary | ICD-10-CM | POA: Diagnosis not present

## 2014-08-20 DIAGNOSIS — K219 Gastro-esophageal reflux disease without esophagitis: Secondary | ICD-10-CM | POA: Diagnosis not present

## 2014-08-20 DIAGNOSIS — M159 Polyosteoarthritis, unspecified: Secondary | ICD-10-CM

## 2014-08-20 DIAGNOSIS — G4700 Insomnia, unspecified: Secondary | ICD-10-CM | POA: Diagnosis not present

## 2014-08-20 MED ORDER — HYDROCODONE-ACETAMINOPHEN 10-325 MG PO TABS: 1.0000 | ORAL_TABLET | Freq: Four times a day (QID) | ORAL | Status: DC | PRN

## 2014-08-20 NOTE — Progress Notes (Signed)
Patient ID: Tracy Mann, female   DOB: Jun 28, 1933, 81 y.o.   MRN: [id number]    Subjective:  HPI   Hypertension, follow-up:  BP Readings from Last 3 Encounters:  08/20/14 128/62  07/14/14 120/60  07/09/14 110/62    She was last seen for hypertension 2 months ago.  BP at that visit was 120/60. Management changes since that visit include none. She reports good compliance with treatment. She is not having side effects.  She is not exercising. She is adherent to low salt diet.   Outside blood pressures are not doing lately. She is experiencing pitting edema and called cardiologist 2 days ago and has been taking Lasix 2 tablets daily .  Patient denies chest pressure/discomfort.  States from heart issues she has this is chronic. SOB Cardiovascular risk factors include obesity (BMI >= 30 kg/m2).  Use of agents associated with hypertension: none.     Weight trend: decreasing steadily Wt Readings from Last 3 Encounters:  08/20/14 235 lb (106.595 kg)  07/14/14 227 lb 4 oz (103.08 kg)  07/09/14 238 lb (107.956 kg)    Current diet: low salt   GERD, Follow up:  The patient was last seen for GERD 2 months ago. Changes made since that visit include none.  She reports good compliance with treatment. She is not on any medications for this anymore.  She IS experiencing none. She is NOT experiencing belching   Insomnia  She presents today for evaluation of insomnia. She stays sometimes she sleeps good and sometimes not-she stopped taking Ambien and occasionally takes Lorazepam. She has trouble sleeping many times a week.   She has difficulty FALLING asleep. She does not have difficulty STAYING asleep. She does not wake frequently to urinate. She does have urge to move legs when resting. She is not having pain when trying to sleep  She is having anxiety when she can not breath good. She is not having a lot of stress in her life. . She is not having  depression.    Joint/Muscle Pain: Patient complains of arthralgias for which has been present for a few months. Pain is located in multiple joints, is described as aching, and is constant .  Associated symptoms include: decreased range of motion.  The patient has taking Lorcet on regular basis. Takes it every 6 hours. Related to injury:   No.     Chronic atrial fibrillation: Her INR today 1.6, PT-19.6. Takes Coumadin 3 mg daily. She has been trying to keep an eye on the greens intake.       Prior to Admission medications   Medication Sig Start Date End Date Taking? Authorizing Provider  amoxicillin (AMOXIL) 500 MG capsule  06/17/14   Historical Provider, MD  ciprofloxacin (CIPRO) 250 MG tablet  05/31/14   Historical Provider, MD  COMBIVENT RESPIMAT 20-100 MCG/ACT AERS respimat 1 puff every 6 (six) hours as needed.  07/03/14   Historical Provider, MD  fluticasone (FLONASE) 50 MCG/ACT nasal spray 2 (TWO) SPRAYS INTRANASALLY SPRAYS INTRANASALLY, NASAL, DAILY 06/01/14   Historical Provider, MD  furosemide (LASIX) 20 MG tablet Take 1 tablet (20 mg total) by mouth daily. Take one tablet AS NEEDED for weight gain of 2lbs or more in a day or 5lbs in a week. 07/27/14   Rise Mu, PA-C  HYDROcodone-acetaminophen (NORCO) 10-325 MG per tablet Take 1 tablet by mouth as needed.      Historical Provider, MD  insulin glargine (LANTUS) 100 UNIT/ML injection Inject 22 Units  into the skin at bedtime.    Historical Provider, MD  insulin lispro (HUMALOG) 100 UNIT/ML KiwkPen Inject up to 10 units three times daily before each meal as directed 10/23/13   Historical Provider, MD  LANTUS SOLOSTAR 100 UNIT/ML Solostar Pen  06/16/14   Historical Provider, MD  Levothyroxine Sodium 88 MCG CAPS Take 88 mcg by mouth daily.    Historical Provider, MD  lisinopril (PRINIVIL,ZESTRIL) 2.5 MG tablet Take 1 tablet (2.5 mg total) by mouth daily. 08/11/13   Minna Merritts, MD  LORazepam (ATIVAN) 1 MG tablet Take 1 mg by mouth 2 (two)  times daily as needed.      Historical Provider, MD  magnesium sulfate 50 % injection Take by mouth.    Historical Provider, MD  metoprolol succinate (TOPROL-XL) 25 MG 24 hr tablet Take 1 tablet (25 mg total) by mouth 2 (two) times daily. 10/21/13   Minna Merritts, MD  nitroGLYCERIN (NITROSTAT) 0.4 MG SL tablet Place 1 tablet (0.4 mg total) under the tongue every 5 (five) minutes as needed. May repeat for up to 3 doses. 08/05/10   Minna Merritts, MD  omeprazole (PRILOSEC) 20 MG capsule Take 20 mg by mouth daily. 05/23/14   Historical Provider, MD  phenazopyridine (PYRIDIUM) 100 MG tablet Take 100 mg by mouth as needed.      Historical Provider, MD  warfarin (COUMADIN) 3 MG tablet Take 1 tablet (3 mg total) by mouth daily. 08/12/14   Jerrol Banana., MD  warfarin (COUMADIN) 4 MG tablet  05/17/14   Historical Provider, MD  warfarin (COUMADIN) 5 MG tablet  05/17/14   Historical Provider, MD  ZETIA 10 MG tablet Take 1 tablet by mouth Daily. 02/07/11   Historical Provider, MD    Patient Active Problem List   Diagnosis Date Noted  . Mixed Ischemic/Nonischemic Cardiomyopathy   . Type II diabetes mellitus   . Chronic atrial fibrillation   . Chronic combined systolic and diastolic CHF (congestive heart failure)   . Chronic anticoagulation   . Absolute anemia 07/08/2014  . Airway hyperreactivity 07/08/2014  . Cervical muscle strain 07/08/2014  . PNA (pneumonia) 07/08/2014  . Chest pressure 07/08/2014  . Clinical depression 07/08/2014  . Degeneration of lumbar or lumbosacral intervertebral disc 07/08/2014  . Accumulation of fluid in tissues 07/08/2014  . Anxiety, generalized 07/08/2014  . Acid reflux 07/08/2014  . Folliculitis 25/95/6387  . Adult hypothyroidism 07/08/2014  . Broken leg 07/08/2014  . Cannot sleep 07/08/2014  . Obstructive sleep apnea 07/08/2014  . Arthritis, degenerative 07/08/2014  . Onychia of finger 07/08/2014  . Psoriasis 07/08/2014  . Restless legs syndrome 07/08/2014   . Adult BMI 30+ 07/08/2014  . Calculus of kidney 10/31/2011  . Bladder infection, chronic 10/31/2011  . Incomplete bladder emptying 10/31/2011  . Obesity 11/02/2010  . SHORTNESS OF BREATH 02/01/2010  . Hyperlipidemia 09/16/2008  . HYPERTENSION, HEART CONTROLLED W/O ASSOC CHF 09/16/2008  . CAD, NATIVE VESSEL 09/16/2008    Past Medical History  Diagnosis Date  . Chronic atrial fibrillation     a. CHA2DS2VASc = 6--> warfarin.  . Chronic combined systolic and diastolic CHF (congestive heart failure)     a. 10/2012 Echo: EF 35-40%, diff HK, mild MR, mild biatrial enlargement;  b. 11/2012 EF 45-50% by LV gram; c. echo 07/2014: EF 40-45%, mod conc LVH, mod MR, severe biatrial enlargement, PASP 45 mm Hg  . Chronic anticoagulation     a. warfarin.  . Acute myocardial infarction of inferior  wall     a. Presumed to be 2/2 to a coronary embolus in setting of persistent Afib-->Cath relatively nl cors, EF 45-50% w/ severe distal inferior/apical inferior HK w/ aneursymal appearance.  . Essential hypertension   . Obesity   . Hyperlipidemia, mixed   . Type II diabetes mellitus   . Mixed Ischemic/Nonischemic Cardiomyopathy     a. 10/2012 Echo: EF 35-40%;  b. 11/2012 EF 45-50% by LV gram.    History   Social History  . Marital Status: Widowed    Spouse Name: widowed  . Number of Children: 4  . Years of Education: 14   Occupational History  . retired    Social History Main Topics  . Smoking status: Never Smoker   . Smokeless tobacco: Never Used  . Alcohol Use: No  . Drug Use: No  . Sexual Activity: No   Other Topics Concern  . Not on file   Social History Narrative   Widowed   Has children and grandchildren   Lives in [place]  . Clarithromycin     Hives, headaches, hard time swelling, felt like throat was closing up  . Diphenhydramine   . Estrogens Conjugated   . Sulfonamide Derivatives Swelling    'tongue swelling'    Review of  Systems  Constitutional: Negative for fever, chills and malaise/fatigue.  HENT: Positive for ear pain.   Respiratory: Positive for shortness of breath (chronic). Negative for cough, hemoptysis, sputum production and wheezing.   Cardiovascular: Positive for leg swelling. Negative for chest pain, palpitations, orthopnea and claudication.  Musculoskeletal: Positive for myalgias, joint pain and neck pain (on the right side).  Neurological: Positive for headaches (for 2 months daily ). Negative for dizziness.     There is no immunization history on file for this patient. Objective:  BP 128/62 mmHg  Pulse 72  Temp(Src) 98.7 F (37.1 C)  Resp 18  Ht 5' 3.75" (1.619 m)  Wt 235 lb (106.595 kg)  BMI 40.67 kg/m2  Physical Exam  Constitutional: She is well-developed, well-nourished, and in no distress.  HENT:  Head: Normocephalic.  Eyes: Conjunctivae are normal. Pupils are equal, round, and reactive to light.  Neck: Normal range of motion. Neck supple.  Cardiovascular: Normal rate, regular rhythm, normal heart sounds and intact distal pulses.  Exam reveals no gallop and no friction rub.   No murmur heard. Pulmonary/Chest: Effort normal and breath sounds normal. No respiratory distress. She has no wheezes. She has no rales.  Musculoskeletal: Normal range of motion. She exhibits edema.  Skin: There is erythema.    Lab Results  Component Value Date   WBC 5.0 01/23/2014   HGB 11.2* 01/23/2014   HCT 34.0* 01/23/2014   PLT 232 01/23/2014   GLUCOSE 142* 07/14/2014   TSH 1.340 07/14/2014   INR 3.4 07/29/2014   HGBA1C 6.2 09/08/2008    CMP     Component Value Date/Time   NA 141 07/14/2014 1555   NA 136 01/23/2014 0406   NA 137 11/22/2006 2309   K 4.3 07/14/2014 1555   K 4.4 01/23/2014 0406   CL 97 07/14/2014 1555   CL 100 01/23/2014 0406   CO2 23 07/14/2014 1555   CO2 30 01/23/2014 0406   GLUCOSE 142* 07/14/2014 1555   GLUCOSE 270* 01/23/2014 0406   GLUCOSE 104* 11/22/2006 2309    BUN 22 07/14/2014 1555   BUN 26* 01/23/2014 0406   BUN 17 11/22/2006 2309  CREATININE 0.73 07/14/2014 1555   CREATININE 0.94 01/23/2014 0406   CALCIUM 9.5 07/14/2014 1555   CALCIUM 8.7 01/23/2014 0406   PROT 8.1 05/06/2013 2206   ALBUMIN 3.5 05/06/2013 2206   AST 25 05/06/2013 2206   ALT 20 05/06/2013 2206   ALKPHOS 99 05/06/2013 2206   BILITOT 0.7 05/06/2013 2206   GFRNONAA 79 07/14/2014 1555   GFRNONAA >60 05/07/2013 0535   GFRAA 91 07/14/2014 1555   GFRAA >60 05/07/2013 0535    Assessment and Plan :  Hypertension Stable.  GERD Stable. Off the medication.  Insomnia Stable.  Osteoarthritis Stable. Refills given.  Edema New. Advised to increase Lasix 20 mg to 2 tablets twice daily until Saturday and re check Monday or Tuesday. Advised patient to keep sores/blisters in the lower legs clean with soap. EKG stable today. O2 today 92%. Possible mild CHF Will get labs checked on the next visit.  Chronic atrial fib INR today 1.6-advised patient to alternate 3 mg and 4 mg and re checking in 1 week.   Patient was seen and examined by Dr. Eulas Post and note was scribed by Theressa Millard, RMA.    Miguel Aschoff MD [facility] 08/20/2014 4:06 PM

## 2014-08-21 ENCOUNTER — Ambulatory Visit
Admission: RE | Admit: 2014-08-21 | Discharge: 2014-08-21 | Disposition: A | Discharge status: Home / Self Care | Payer: Commercial Managed Care - HMO | Source: Ambulatory Visit | Attending: Family Medicine | Admitting: Family Medicine

## 2014-08-21 DIAGNOSIS — J449 Chronic obstructive pulmonary disease, unspecified: Secondary | ICD-10-CM | POA: Diagnosis not present

## 2014-08-21 DIAGNOSIS — R0602 Shortness of breath: Secondary | ICD-10-CM | POA: Diagnosis present

## 2014-08-24 MED ORDER — WARFARIN SODIUM 3 MG PO TABS: 3.0000 mg | ORAL_TABLET | Freq: Every day | ORAL | Status: DC

## 2014-08-24 NOTE — Telephone Encounter (Signed)
-----   Message from Jerrol Banana., MD sent at 08/23/2014 10:40 AM EDT ----- Stable CHF--keep appt this week.

## 2014-08-24 NOTE — Telephone Encounter (Signed)
Pt advised and RX sent in-aa 

## 2014-08-24 NOTE — Telephone Encounter (Signed)
Pt stated she was returning Ana's call. Thanks TNP °

## 2014-08-24 NOTE — Telephone Encounter (Signed)
LMTCB  aa 

## 2014-08-25 ENCOUNTER — Encounter: Payer: Self-pay | Admitting: Family Medicine

## 2014-08-25 ENCOUNTER — Ambulatory Visit (INDEPENDENT_AMBULATORY_CARE_PROVIDER_SITE_OTHER): Payer: Commercial Managed Care - HMO | Admitting: Family Medicine

## 2014-08-25 VITALS — BP 124/56 | HR 88 | Temp 99.1°F | Resp 18 | Wt 234.8 lb

## 2014-08-25 DIAGNOSIS — R609 Edema, unspecified: Secondary | ICD-10-CM

## 2014-08-25 DIAGNOSIS — M150 Primary generalized (osteo)arthritis: Secondary | ICD-10-CM

## 2014-08-25 DIAGNOSIS — I482 Chronic atrial fibrillation, unspecified: Secondary | ICD-10-CM

## 2014-08-25 DIAGNOSIS — I10 Essential (primary) hypertension: Secondary | ICD-10-CM

## 2014-08-25 DIAGNOSIS — E669 Obesity, unspecified: Secondary | ICD-10-CM

## 2014-08-25 DIAGNOSIS — M159 Polyosteoarthritis, unspecified: Secondary | ICD-10-CM

## 2014-08-25 DIAGNOSIS — I5042 Chronic combined systolic (congestive) and diastolic (congestive) heart failure: Secondary | ICD-10-CM | POA: Diagnosis not present

## 2014-08-25 LAB — POCT INR: INR: 2.9

## 2014-08-25 MED ORDER — HYDROCODONE-ACETAMINOPHEN 10-325 MG PO TABS: 1.0000 | ORAL_TABLET | Freq: Four times a day (QID) | ORAL | Status: DC | PRN

## 2014-08-25 MED ORDER — WARFARIN SODIUM 4 MG PO TABS: 4.0000 mg | ORAL_TABLET | Freq: Every day | ORAL | Status: DC

## 2014-08-25 MED ORDER — FUROSEMIDE 20 MG PO TABS: 20.0000 mg | ORAL_TABLET | Freq: Every day | ORAL | Status: DC

## 2014-08-25 NOTE — Progress Notes (Signed)
Patient ID: Tracy Mann, female   DOB: [birth year], 81 y.o.   MRN: [id number]    Subjective:  HPI  Edema follow up: Patient is here for 5 day follow up. She has improved and states she feels much better, swelling in her lower legs feel better not as tight, she is breathing better and overall feels better than she has in a long time. Her weight is down from 253 lbs to 234.8. She took Lasix 20 mg 2 tablets twice daily for 3 days and then went back to 2 tablets once daily. Her 02 is up to 94% from 92% on her last visit.  Chronic atrial fibrillation follow up: Last INR was last week of 1.6 she took 5 mg daily and today INR 2.9.  Prior to Admission medications   Medication Sig Start Date End Date Taking? Authorizing Provider  ciprofloxacin (CIPRO) 250 MG tablet  05/31/14   Historical Provider, MD  COMBIVENT RESPIMAT 20-100 MCG/ACT AERS respimat 1 puff every 6 (six) hours as needed.  07/03/14   Historical Provider, MD  fluticasone (FLONASE) 50 MCG/ACT nasal spray 2 (TWO) SPRAYS INTRANASALLY SPRAYS INTRANASALLY, NASAL, DAILY 06/01/14   Historical Provider, MD  furosemide (LASIX) 20 MG tablet Take 1 tablet (20 mg total) by mouth daily. Take one tablet AS NEEDED for weight gain of 2lbs or more in a day or 5lbs in a week. 07/27/14   Rise Mu, PA-C  HYDROcodone-acetaminophen (NORCO) 10-325 MG per tablet Take 1 tablet by mouth every 6 (six) hours as needed. 08/20/14   Richard Maceo Pro., MD  insulin glargine (LANTUS) 100 UNIT/ML injection Inject 22 Units into the skin at bedtime.    Historical Provider, MD  insulin lispro (HUMALOG) 100 UNIT/ML KiwkPen Inject up to 10 units three times daily before each meal as directed 10/23/13   Historical Provider, MD  LANTUS SOLOSTAR 100 UNIT/ML Solostar Pen  06/16/14   Historical Provider, MD  Levothyroxine Sodium 88 MCG CAPS Take 88 mcg by mouth daily.    Historical Provider, MD  lisinopril (PRINIVIL,ZESTRIL) 2.5 MG tablet Take 1 tablet (2.5 mg total) by mouth daily.  08/11/13   Minna Merritts, MD  LORazepam (ATIVAN) 1 MG tablet Take 1 mg by mouth 2 (two) times daily as needed.      Historical Provider, MD  magnesium sulfate 50 % injection Take by mouth.    Historical Provider, MD  metoprolol succinate (TOPROL-XL) 25 MG 24 hr tablet Take 1 tablet (25 mg total) by mouth 2 (two) times daily. 10/21/13   Minna Merritts, MD  nitroGLYCERIN (NITROSTAT) 0.4 MG SL tablet Place 1 tablet (0.4 mg total) under the tongue every 5 (five) minutes as needed. May repeat for up to 3 doses. 08/05/10   Minna Merritts, MD  phenazopyridine (PYRIDIUM) 100 MG tablet Take 100 mg by mouth as needed.      Historical Provider, MD  warfarin (COUMADIN) 3 MG tablet Take 1 tablet (3 mg total) by mouth daily. 08/24/14   Jerrol Banana., MD  warfarin (COUMADIN) 4 MG tablet  05/17/14   Historical Provider, MD  warfarin (COUMADIN) 5 MG tablet  05/17/14   Historical Provider, MD  ZETIA 10 MG tablet Take 1 tablet by mouth Daily. 02/07/11   Historical Provider, MD    Patient Active Problem List   Diagnosis Date Noted  . Mixed Ischemic/Nonischemic Cardiomyopathy   . Type II diabetes mellitus   . Chronic atrial fibrillation   . Chronic combined  systolic and diastolic CHF (congestive heart failure)   . Chronic anticoagulation   . Absolute anemia 07/08/2014  . Airway hyperreactivity 07/08/2014  . Cervical muscle strain 07/08/2014  . PNA (pneumonia) 07/08/2014  . Chest pressure 07/08/2014  . Clinical depression 07/08/2014  . Degeneration of lumbar or lumbosacral intervertebral disc 07/08/2014  . Accumulation of fluid in tissues 07/08/2014  . Anxiety, generalized 07/08/2014  . Acid reflux 07/08/2014  . Folliculitis 61/60/7371  . Adult hypothyroidism 07/08/2014  . Broken leg 07/08/2014  . Cannot sleep 07/08/2014  . Obstructive sleep apnea 07/08/2014  . Arthritis, degenerative 07/08/2014  . Onychia of finger 07/08/2014  . Psoriasis 07/08/2014  . Restless legs syndrome 07/08/2014  .  Adult BMI 30+ 07/08/2014  . Calculus of kidney 10/31/2011  . Bladder infection, chronic 10/31/2011  . Incomplete bladder emptying 10/31/2011  . Obesity 11/02/2010  . SHORTNESS OF BREATH 02/01/2010  . Hyperlipidemia 09/16/2008  . HYPERTENSION, HEART CONTROLLED W/O ASSOC CHF 09/16/2008  . CAD, NATIVE VESSEL 09/16/2008    Past Medical History  Diagnosis Date  . Chronic atrial fibrillation     a. CHA2DS2VASc = 6--> warfarin.  . Chronic combined systolic and diastolic CHF (congestive heart failure)     a. 10/2012 Echo: EF 35-40%, diff HK, mild MR, mild biatrial enlargement;  b. 11/2012 EF 45-50% by LV gram; c. echo 07/2014: EF 40-45%, mod conc LVH, mod MR, severe biatrial enlargement, PASP 45 mm Hg  . Chronic anticoagulation     a. warfarin.  . Acute myocardial infarction of inferior wall     a. Presumed to be 2/2 to a coronary embolus in setting of persistent Afib-->Cath relatively nl cors, EF 45-50% w/ severe distal inferior/apical inferior HK w/ aneursymal appearance.  . Essential hypertension   . Obesity   . Hyperlipidemia, mixed   . Type II diabetes mellitus   . Mixed Ischemic/Nonischemic Cardiomyopathy     a. 10/2012 Echo: EF 35-40%;  b. 11/2012 EF 45-50% by LV gram.    History   Social History  . Marital Status: Widowed    Spouse Name: widowed  . Number of Children: 4  . Years of Education: 14   Occupational History  . retired    Social History Main Topics  . Smoking status: Never Smoker   . Smokeless tobacco: Never Used  . Alcohol Use: No  . Drug Use: No  . Sexual Activity: No   Other Topics Concern  . Not on file   Social History Narrative   Widowed   Has children and grandchildren   Lives in [place]  . Clarithromycin     Hives, headaches, hard time swelling, felt like throat was closing up  . Diphenhydramine   . Estrogens Conjugated   . Sulfonamide Derivatives Swelling    'tongue swelling'    Review of Systems    Constitutional: Negative for fever, chills and malaise/fatigue.  HENT: Positive for ear pain (better).   Respiratory: Positive for shortness of breath (better). Negative for cough, hemoptysis and sputum production.   Cardiovascular: Positive for leg swelling (better). Negative for chest pain, palpitations and claudication.  Gastrointestinal: Negative for heartburn, nausea and vomiting.  Musculoskeletal: Positive for joint pain (chronic).  Neurological: Negative for dizziness and weakness.     There is no immunization history on file for this patient. Objective:  BP 124/56 mmHg  Pulse 88  Temp(Src) 99.1 F (37.3 C)  Resp 18  Wt 234 lb 12.8 oz (106.505  kg)  SpO2 94%  Physical Exam  Constitutional: She is oriented to person, place, and time and well-developed, well-nourished, and in no distress.  HENT:  Head: Normocephalic.  Right Ear: External ear normal.  Left Ear: External ear normal.  Eyes: Conjunctivae are normal. Pupils are equal, round, and reactive to light.  Neck: Neck supple.  Cardiovascular: Normal rate, regular rhythm, normal heart sounds and intact distal pulses.   No murmur heard. Pulmonary/Chest: Effort normal and breath sounds normal. No respiratory distress. She has no wheezes.  Abdominal: Soft. Bowel sounds are normal.  Musculoskeletal: She exhibits edema.  Improved from last week. Now one plus pitting edema.  Neurological: She is alert and oriented to person, place, and time. Gait normal.  Skin: Skin is warm and dry.  Less erythema of lower legs. Some eczematous changes but no sign of secondary infection.  Psychiatric: Mood, memory, affect and judgment normal.    Lab Results  Component Value Date   WBC 5.0 01/23/2014   HGB 11.2* 01/23/2014   HCT 34.0* 01/23/2014   PLT 232 01/23/2014   GLUCOSE 142* 07/14/2014   TSH 1.340 07/14/2014   INR 3.4 07/29/2014   HGBA1C 6.2 09/08/2008    CMP     Component Value Date/Time   NA 141 07/14/2014 1555   NA  136 01/23/2014 0406   NA 137 11/22/2006 2309   K 4.3 07/14/2014 1555   K 4.4 01/23/2014 0406   CL 97 07/14/2014 1555   CL 100 01/23/2014 0406   CO2 23 07/14/2014 1555   CO2 30 01/23/2014 0406   GLUCOSE 142* 07/14/2014 1555   GLUCOSE 270* 01/23/2014 0406   GLUCOSE 104* 11/22/2006 2309   BUN 22 07/14/2014 1555   BUN 26* 01/23/2014 0406   BUN 17 11/22/2006 2309   CREATININE 0.73 07/14/2014 1555   CREATININE 0.94 01/23/2014 0406   CALCIUM 9.5 07/14/2014 1555   CALCIUM 8.7 01/23/2014 0406   PROT 8.1 05/06/2013 2206   ALBUMIN 3.5 05/06/2013 2206   AST 25 05/06/2013 2206   ALT 20 05/06/2013 2206   ALKPHOS 99 05/06/2013 2206   BILITOT 0.7 05/06/2013 2206   GFRNONAA 79 07/14/2014 1555   GFRNONAA >60 05/07/2013 0535   GFRAA 91 07/14/2014 1555   GFRAA >60 05/07/2013 0535    Assessment and Plan :  1. Edema Better. Will check BNP and Renal today. Advised patient to take Lasix 20 mg 2 tablets once daily. Re check on the next visit.  2. Chronic atrial fibrillation INR 2.9 today-advised patient to alternate 4 mg and 5 mg. Re check 2 weeks.  3. Primary osteoarthritis involving multiple joints Stable.  4. Obesity  5. Mild CHF Clinically this is what patient had last week and was documented on x-ray. A BNP for baseline now that she feels better. 6. Chronic anxiety and depression Stable. 7. Chronic arthritic pain Narcotics refilled 2 more scripts today     Patient was seen and examined by Dr. Eulas Post and note was scribed by Theressa Millard, RMA.     Miguel Aschoff MD [facility] 08/25/2014 11:11 AM

## 2014-08-26 LAB — BRAIN NATRIURETIC PEPTIDE: BNP: 79.2 pg/mL (ref 0.0–100.0)

## 2014-08-26 LAB — RENAL FUNCTION PANEL: Phosphorus: 3.9 mg/dL (ref 2.5–4.5)

## 2014-08-31 ENCOUNTER — Other Ambulatory Visit: Payer: Self-pay | Admitting: Family Medicine

## 2014-08-31 NOTE — Telephone Encounter (Signed)
Dr Darnell Level do you want to fill? ED

## 2014-09-02 ENCOUNTER — Ambulatory Visit: Payer: Self-pay | Admitting: Family Medicine

## 2014-09-03 ENCOUNTER — Telehealth: Payer: Self-pay | Admitting: Family Medicine

## 2014-09-08 ENCOUNTER — Ambulatory Visit (INDEPENDENT_AMBULATORY_CARE_PROVIDER_SITE_OTHER): Payer: Commercial Managed Care - HMO | Admitting: Family Medicine

## 2014-09-08 VITALS — BP 108/60 | HR 64 | Temp 98.0°F | Resp 16 | Wt 234.0 lb

## 2014-09-08 DIAGNOSIS — R609 Edema, unspecified: Secondary | ICD-10-CM | POA: Diagnosis not present

## 2014-09-08 DIAGNOSIS — I10 Essential (primary) hypertension: Secondary | ICD-10-CM | POA: Diagnosis not present

## 2014-09-08 DIAGNOSIS — I482 Chronic atrial fibrillation, unspecified: Secondary | ICD-10-CM

## 2014-09-08 LAB — POCT INR: INR: 2.9

## 2014-09-08 MED ORDER — FUROSEMIDE 20 MG PO TABS: 20.0000 mg | ORAL_TABLET | Freq: Every day | ORAL | Status: DC

## 2014-09-08 NOTE — Progress Notes (Signed)
Patient ID: Tracy Mann, female   DOB: 1934-01-10, 81 y.o.   MRN: [id number]    Subjective:  HPI Pt is here today for a 2 week f/u of edema and PT/INR checked. She reports that the swelling has improved but she is having to take Lasix BID. She will need a new rx sent for this because she will run out early. Pt wants to know if she needs to be taking Potassium. She has not been but she has not been having cramps.    AFib- PT INR LOV was 2.9. Note says alternate 4 and 5mg . Pt alternated 3 and 4mg . Today her INR was 2.9 again. But she does say that she has not been eating as much greens lately.    Prior to Admission medications   Medication Sig Start Date End Date Taking? Authorizing Provider  ciprofloxacin (CIPRO) 250 MG tablet  05/31/14  Yes Historical Provider, MD  COMBIVENT RESPIMAT 20-100 MCG/ACT AERS respimat 1 puff every 6 (six) hours as needed.  07/03/14  Yes Historical Provider, MD  fluticasone (FLONASE) 50 MCG/ACT nasal spray 2 (TWO) SPRAYS INTRANASALLY SPRAYS INTRANASALLY, NASAL, DAILY 06/01/14  Yes Historical Provider, MD  furosemide (LASIX) 20 MG tablet Take 1 tablet (20 mg total) by mouth daily. Take one tablet AS NEEDED for weight gain of 2lbs or more in a day or 5lbs in a week. 08/25/14  Yes Richard Maceo Pro., MD  HYDROcodone-acetaminophen [facility]) 10-325 MG per tablet Take 1 tablet by mouth every 6 (six) hours as needed. On 10/02/2014 08/25/14  Yes Richard L Cranford Mon., MD  insulin glargine (LANTUS) 100 UNIT/ML injection Inject 22 Units into the skin at bedtime.   Yes Historical Provider, MD  insulin lispro (HUMALOG) 100 UNIT/ML KiwkPen Inject up to 10 units three times daily before each meal as directed 10/23/13  Yes Historical Provider, MD  LANTUS SOLOSTAR 100 UNIT/ML Solostar Pen  06/16/14  Yes Historical Provider, MD  Levothyroxine Sodium 88 MCG CAPS Take 88 mcg by mouth daily.   Yes Historical Provider, MD  lisinopril (PRINIVIL,ZESTRIL) 2.5 MG tablet Take 1 tablet (2.5 mg total) by  mouth daily. 08/11/13  Yes Minna Merritts, MD  LORazepam (ATIVAN) 1 MG tablet TAKE 1 TABLET BY MOUTH TWICE A DAY AS NEEDED FOR ANXIETY 09/01/14  Yes Jerrol Banana., MD  magnesium sulfate 50 % injection Take by mouth.   Yes Historical Provider, MD  metoprolol succinate (TOPROL-XL) 25 MG 24 hr tablet Take 1 tablet (25 mg total) by mouth 2 (two) times daily. 10/21/13  Yes Minna Merritts, MD  nitroGLYCERIN (NITROSTAT) 0.4 MG SL tablet Place 1 tablet (0.4 mg total) under the tongue every 5 (five) minutes as needed. May repeat for up to 3 doses. 08/05/10  Yes Minna Merritts, MD  phenazopyridine (PYRIDIUM) 100 MG tablet Take 100 mg by mouth as needed.     Yes Historical Provider, MD  warfarin (COUMADIN) 3 MG tablet Take 1 tablet (3 mg total) by mouth daily. 08/24/14  Yes Richard Maceo Pro., MD  warfarin (COUMADIN) 4 MG tablet Take 1 tablet (4 mg total) by mouth daily. 08/25/14  Yes Richard Maceo Pro., MD  warfarin (COUMADIN) 5 MG tablet  05/17/14  Yes Historical Provider, MD  ZETIA 10 MG tablet Take 1 tablet by mouth Daily. 02/07/11  Yes Historical Provider, MD    Patient Active Problem List   Diagnosis Date Noted  . Mixed Ischemic/Nonischemic Cardiomyopathy   . Type II diabetes  mellitus   . Chronic atrial fibrillation   . Chronic combined systolic and diastolic CHF (congestive heart failure)   . Chronic anticoagulation   . Absolute anemia 07/08/2014  . Airway hyperreactivity 07/08/2014  . Cervical muscle strain 07/08/2014  . PNA (pneumonia) 07/08/2014  . Chest pressure 07/08/2014  . Clinical depression 07/08/2014  . Degeneration of lumbar or lumbosacral intervertebral disc 07/08/2014  . Accumulation of fluid in tissues 07/08/2014  . Anxiety, generalized 07/08/2014  . Acid reflux 07/08/2014  . Folliculitis 17/61/6073  . Adult hypothyroidism 07/08/2014  . Broken leg 07/08/2014  . Cannot sleep 07/08/2014  . Obstructive sleep apnea 07/08/2014  . Arthritis, degenerative 07/08/2014  .  Onychia of finger 07/08/2014  . Psoriasis 07/08/2014  . Restless legs syndrome 07/08/2014  . Adult BMI 30+ 07/08/2014  . Calculus of kidney 10/31/2011  . Bladder infection, chronic 10/31/2011  . Incomplete bladder emptying 10/31/2011  . Obesity 11/02/2010  . SHORTNESS OF BREATH 02/01/2010  . Hyperlipidemia 09/16/2008  . HYPERTENSION, HEART CONTROLLED W/O ASSOC CHF 09/16/2008  . CAD, NATIVE VESSEL 09/16/2008    Past Medical History  Diagnosis Date  . Chronic atrial fibrillation     a. CHA2DS2VASc = 6--> warfarin.  . Chronic combined systolic and diastolic CHF (congestive heart failure)     a. 10/2012 Echo: EF 35-40%, diff HK, mild MR, mild biatrial enlargement;  b. 11/2012 EF 45-50% by LV gram; c. echo 07/2014: EF 40-45%, mod conc LVH, mod MR, severe biatrial enlargement, PASP 45 mm Hg  . Chronic anticoagulation     a. warfarin.  . Acute myocardial infarction of inferior wall     a. Presumed to be 2/2 to a coronary embolus in setting of persistent Afib-->Cath relatively nl cors, EF 45-50% w/ severe distal inferior/apical inferior HK w/ aneursymal appearance.  . Essential hypertension   . Obesity   . Hyperlipidemia, mixed   . Type II diabetes mellitus   . Mixed Ischemic/Nonischemic Cardiomyopathy     a. 10/2012 Echo: EF 35-40%;  b. 11/2012 EF 45-50% by LV gram.    History   Social History  . Marital Status: Widowed    Spouse Name: widowed  . Number of Children: 4  . Years of Education: 14   Occupational History  . retired    Social History Main Topics  . Smoking status: Never Smoker   . Smokeless tobacco: Never Used  . Alcohol Use: No  . Drug Use: No  . Sexual Activity: No   Other Topics Concern  . Not on file   Social History Narrative   Widowed   Has children and grandchildren   Lives in [place]  . Clarithromycin     Hives, headaches, hard time swelling, felt like throat was closing up  . Diphenhydramine   . Estrogens  Conjugated   . Sulfonamide Derivatives Swelling    'tongue swelling'    Review of Systems  Constitutional: Negative.   HENT: Negative.   Eyes: Negative.   Respiratory: Positive for shortness of breath.   Cardiovascular: Positive for leg swelling.  Gastrointestinal: Negative.   Genitourinary: Negative.   Musculoskeletal: Negative.   Skin: Negative.   Neurological: Negative.   Psychiatric/Behavioral: Negative.      There is no immunization history on file for this patient. Objective:  BP 108/60 mmHg  Pulse 64  Temp(Src) 98 F (36.7 C) (Oral)  Resp 16  Wt 234 lb (106.142 kg)  Physical Exam  Constitutional: She is  oriented to person, place, and time and well-developed, well-nourished, and in no distress.  Mildl/moderate obesity  HENT:  Head: Normocephalic and atraumatic.  Right Ear: External ear normal.  Left Ear: External ear normal.  Nose: Nose normal.  Eyes: Conjunctivae are normal.  Neck: Neck supple.  Cardiovascular: Normal rate, regular rhythm and normal heart sounds.   Pulmonary/Chest: Effort normal and breath sounds normal.  Neurological: She is alert and oriented to person, place, and time.  Skin: Skin is warm and dry.  Very fair skin. No cellulitis of the lower extremities today.  Psychiatric: Mood, memory, affect and judgment normal.    Lab Results  Component Value Date   WBC 5.0 01/23/2014   HGB 11.2* 01/23/2014   HCT 34.0* 01/23/2014   PLT 232 01/23/2014   GLUCOSE 142* 07/14/2014   TSH 1.340 07/14/2014   INR 2.9 08/25/2014   HGBA1C 6.2 09/08/2008    CMP     Component Value Date/Time   NA 141 07/14/2014 1555   NA 136 01/23/2014 0406   NA 137 11/22/2006 2309   K 4.3 07/14/2014 1555   K 4.4 01/23/2014 0406   CL 97 07/14/2014 1555   CL 100 01/23/2014 0406   CO2 23 07/14/2014 1555   CO2 30 01/23/2014 0406   GLUCOSE 142* 07/14/2014 1555   GLUCOSE 270* 01/23/2014 0406   GLUCOSE 104* 11/22/2006 2309   BUN 22 07/14/2014 1555   BUN 26*  01/23/2014 0406   BUN 17 11/22/2006 2309   CREATININE 0.73 07/14/2014 1555   CREATININE 0.94 01/23/2014 0406   CALCIUM 9.5 07/14/2014 1555   CALCIUM 8.7 01/23/2014 0406   PROT 8.1 05/06/2013 2206   ALBUMIN 3.5 05/06/2013 2206   AST 25 05/06/2013 2206   ALT 20 05/06/2013 2206   ALKPHOS 99 05/06/2013 2206   BILITOT 0.7 05/06/2013 2206   GFRNONAA 79 07/14/2014 1555   GFRNONAA >60 05/07/2013 0535   GFRAA 91 07/14/2014 1555   GFRAA >60 05/07/2013 0535    Assessment and Plan :  CAD without angina All risk factors treated Chronic CHF Treat this fluid overload or CHF presently. I told her that the bleeding of Lasix could damage her kidneys so he cannot be a chronic change. May need referral back to cardiology. See her back next week Venous insufficiency and dependent edema Double Lasix to twice a day Atrial fibrillation Chronic anticoagulation I have done the exam and reviewed the above chart and it is accurate to the best of my knowledge.   Miguel Aschoff MD [facility] 09/08/2014 11:43 AM

## 2014-09-09 ENCOUNTER — Telehealth: Payer: Self-pay

## 2014-09-09 LAB — RENAL FUNCTION PANEL
ALBUMIN: 4 g/dL (ref 3.5–4.8)
BUN/Creatinine Ratio: 41 — ABNORMAL HIGH (ref 11–26)
BUN: 26 mg/dL (ref 8–27)
CO2: 27 mmol/L (ref 18–29)
Calcium: 9.4 mg/dL (ref 8.7–10.3)
Chloride: 97 mmol/L (ref 97–108)
Creatinine, Ser: 0.63 mg/dL (ref 0.57–1.00)
GFR calc Af Amer: 99 mL/min/{1.73_m2} (ref >59)
GFR, EST NON AFRICAN AMERICAN: 86 mL/min/{1.73_m2} (ref >59)
GLUCOSE: 122 mg/dL — AB (ref 65–99)
PHOSPHORUS: 3.9 mg/dL (ref 2.5–4.5)
Potassium: 4.5 mmol/L (ref 3.5–5.2)
Sodium: 139 mmol/L (ref 134–144)

## 2014-09-09 NOTE — Telephone Encounter (Signed)
Left message to call back about lab results  

## 2014-09-09 NOTE — Telephone Encounter (Signed)
-----   Message from Jerrol Banana., MD sent at 09/09/2014  3:35 PM EDT ----- Labs okay.

## 2014-09-10 ENCOUNTER — Other Ambulatory Visit: Payer: Self-pay

## 2014-09-10 DIAGNOSIS — I10 Essential (primary) hypertension: Secondary | ICD-10-CM

## 2014-09-10 MED ORDER — FUROSEMIDE 20 MG PO TABS: ORAL_TABLET | ORAL | Status: DC

## 2014-09-10 NOTE — Telephone Encounter (Signed)
2 daily

## 2014-09-10 NOTE — Telephone Encounter (Signed)
Pt informed and voiced understanding of results. Pt wanted to know if we were going to call in her Furosemide twice daily instead of once daily, because some days she has to take 2 daily but not every day. Ok to send in rx for Furosemide 20mg  BID?

## 2014-09-10 NOTE — Telephone Encounter (Signed)
Already done-aa 

## 2014-10-28 ENCOUNTER — Encounter: Payer: Self-pay | Admitting: Physician Assistant

## 2014-10-28 ENCOUNTER — Ambulatory Visit (INDEPENDENT_AMBULATORY_CARE_PROVIDER_SITE_OTHER): Payer: Commercial Managed Care - HMO | Admitting: Physician Assistant

## 2014-10-28 VITALS — BP 136/84 | HR 82 | Temp 98.0°F | Resp 18 | Wt 234.8 lb

## 2014-10-28 DIAGNOSIS — I482 Chronic atrial fibrillation, unspecified: Secondary | ICD-10-CM

## 2014-10-28 DIAGNOSIS — J0100 Acute maxillary sinusitis, unspecified: Secondary | ICD-10-CM

## 2014-10-28 LAB — POCT INR
INR: 4.8
PT: 57.6

## 2014-10-28 MED ORDER — LEVOFLOXACIN 500 MG PO TABS: 500.0000 mg | ORAL_TABLET | Freq: Every day | ORAL | Status: DC

## 2014-10-28 MED ORDER — APIXABAN 5 MG PO TABS: ORAL_TABLET | ORAL | Status: DC

## 2014-10-28 NOTE — Progress Notes (Signed)
Subjective:    Patient ID: Tracy Mann, female    DOB: [birth year], 81 y.o.   MRN: [id number]  HPI Tracy Mann is a 81 year old female that comes in to the office today for sinus congestion, cough, runny nose. She states that her symptoms started on Monday and have gradually been worsening. She states the last time she had something like this she ended up with pneumonia. At her hospital stay with the pneumonia they had told her to not wait so long next time and to get treated when her symptoms start and she states that is why she came today. She denies any fevers or chills.  She is also in need of having her INR checked as it has not been checked in August. She states that she is worried about the copayments for how frequently she has to come in for her INR checks and would possibly like to consider trying one of the newer anticoagulation therapies at this time.   Review of Systems  Constitutional: Positive for fatigue. Negative for fever and chills.  HENT: Positive for congestion, postnasal drip, rhinorrhea, sinus pressure and sore throat. Negative for ear discharge, ear pain, tinnitus, trouble swallowing and voice change.   Eyes: Negative for photophobia, pain, discharge, redness, itching and visual disturbance.  Respiratory: Positive for cough (minimal), chest tightness and shortness of breath. Negative for wheezing and stridor.   Cardiovascular: Negative for chest pain and palpitations.       Objective:   Physical Exam  Constitutional: She appears well-developed and well-nourished. No distress.  HENT:  Head: Normocephalic and atraumatic.  Right Ear: Hearing and external ear normal. A middle ear effusion (clear) is present.  Left Ear: Hearing, tympanic membrane and external ear normal.  Nose: Mucosal edema and rhinorrhea present. Right sinus exhibits maxillary sinus tenderness. Right sinus exhibits no frontal sinus tenderness. Left sinus exhibits maxillary sinus tenderness. Left  sinus exhibits no frontal sinus tenderness.  Mouth/Throat: Uvula is midline, oropharynx is clear and moist and mucous membranes are normal. No oropharyngeal exudate.  Eyes: Conjunctivae and EOM are normal. Pupils are equal, round, and reactive to light. Right eye exhibits no discharge. Left eye exhibits no discharge.  Neck: Normal range of motion. Neck supple. No JVD present. No tracheal deviation present. No Brudzinski's sign and no Kernig's sign noted. No thyromegaly present.  Cardiovascular: Normal rate, regular rhythm and normal heart sounds.  Exam reveals no gallop and no friction rub.   No murmur heard. Pulmonary/Chest: Effort normal. No accessory muscle usage or stridor. No respiratory distress. She has no decreased breath sounds. She has wheezes (very faint wheeze heard bilaterally at end inspiration) in the right upper field and the left upper field. She has no rales. She exhibits no tenderness.  Lymphadenopathy:    She has no cervical adenopathy.  Skin: Skin is warm and dry.          Assessment & Plan:  1. Acute maxillary sinusitis, recurrence not specified I will treat her sinusitis as below due to worsening symptoms and history of pneumonia. She does have some chest tightness and wheezing at end inspiration in bilateral upper lung fields. She does have a Combivent inhaler home which advised her to start taking again. She is to call the office if her symptoms fail to improve or worsen. - levofloxacin (LEVAQUIN) 500 MG tablet; Take 1 tablet (500 mg total) by mouth daily.  Dispense: 10 tablet; Refill: 0  2. Chronic atrial fibrillation Due to her concern of  cost of visits for INR checks and with her recent INR being 4.8 we will discontinue her Coumadin and try Eliquis 5 mg twice daily for anticoagulation. I did discuss with her that the medication may be more expensive upfront but she would not have to come in for INR checks on this medication she have to watch her diet while she is  taking this medication. We did discuss adverse event such as bleeding and that if this were to occur for her to call 911. I also advised her to call the office if she develops any GI side effects from this medication as she does have multiple medication sensitivities. If she does well we will follow-up with her in 4 weeks to see how she is doing with the Eliquis. - POCT INR - apixaban (ELIQUIS) 5 MG TABS tablet; Take 1 tablet PO BID.  Do not start medication until 10/30/14.  Dispense: 60 tablet; Refill: 2

## 2014-10-28 NOTE — Patient Instructions (Signed)
Discontinue coumadin (warfarin).   Start Eliquis 5mg  Friday 10/30/14.  Take one tab by mouth in the morning with breakfast and one tab in the evening with supper.  Sinusitis Sinusitis is redness, soreness, and inflammation of the paranasal sinuses. Paranasal sinuses are air pockets within the bones of your face (beneath the eyes, the middle of the forehead, or above the eyes). In healthy paranasal sinuses, mucus is able to drain out, and air is able to circulate through them by way of your nose. However, when your paranasal sinuses are inflamed, mucus and air can become trapped. This can allow bacteria and other germs to grow and cause infection. Sinusitis can develop quickly and last only a short time (acute) or continue over a long period (chronic). Sinusitis that lasts for more than 12 weeks is considered chronic.  CAUSES  Causes of sinusitis include:  Allergies.  Structural abnormalities, such as displacement of the cartilage that separates your nostrils (deviated septum), which can decrease the air flow through your nose and sinuses and affect sinus drainage.  Functional abnormalities, such as when the small hairs (cilia) that line your sinuses and help remove mucus do not work properly or are not present. SIGNS AND SYMPTOMS  Symptoms of acute and chronic sinusitis are the same. The primary symptoms are pain and pressure around the affected sinuses. Other symptoms include:  Upper toothache.  Earache.  Headache.  Bad breath.  Decreased sense of smell and taste.  A cough, which worsens when you are lying flat.  Fatigue.  Fever.  Thick drainage from your nose, which often is green and may contain pus (purulent).  Swelling and warmth over the affected sinuses. DIAGNOSIS  Your health care provider will perform a physical exam. During the exam, your health care provider may:  Look in your nose for signs of abnormal growths in your nostrils (nasal polyps).  Tap over the affected  sinus to check for signs of infection.  View the inside of your sinuses (endoscopy) using an imaging device that has a light attached (endoscope). If your health care provider suspects that you have chronic sinusitis, one or more of the following tests may be recommended:  Allergy tests.  Nasal culture. A sample of mucus is taken from your nose, sent to a lab, and screened for bacteria.  Nasal cytology. A sample of mucus is taken from your nose and examined by your health care provider to determine if your sinusitis is related to an allergy. TREATMENT  Most cases of acute sinusitis are related to a viral infection and will resolve on their own within 10 days. Sometimes medicines are prescribed to help relieve symptoms (pain medicine, decongestants, nasal steroid sprays, or saline sprays).  However, for sinusitis related to a bacterial infection, your health care provider will prescribe antibiotic medicines. These are medicines that will help kill the bacteria causing the infection.  Rarely, sinusitis is caused by a fungal infection. In theses cases, your health care provider will prescribe antifungal medicine. For some cases of chronic sinusitis, surgery is needed. Generally, these are cases in which sinusitis recurs more than 3 times per year, despite other treatments. HOME CARE INSTRUCTIONS   Drink plenty of water. Water helps thin the mucus so your sinuses can drain more easily.  Use a humidifier.  Inhale steam 3 to 4 times a day (for example, sit in the bathroom with the shower running).  Apply a warm, moist washcloth to your face 3 to 4 times a day, or as  directed by your health care provider.  Use saline nasal sprays to help moisten and clean your sinuses.  Take medicines only as directed by your health care provider.  If you were prescribed either an antibiotic or antifungal medicine, finish it all even if you start to feel better. SEEK IMMEDIATE MEDICAL CARE IF:  You have  increasing pain or severe headaches.  You have nausea, vomiting, or drowsiness.  You have swelling around your face.  You have vision problems.  You have a stiff neck.  You have difficulty breathing. MAKE SURE YOU:   Understand these instructions.  Will watch your condition.  Will get help right away if you are not doing well or get worse. Document Released: 01/16/2005 Document Revised: 06/02/2013 Document Reviewed: 01/31/2011 [facility] Patient Information 2015 [place], [place]. This information is not intended to replace advice given to you by your health care provider. Make sure you discuss any questions you have with your health care provider.  Apixaban oral tablets What is this medicine? APIXABAN (a PIX a ban) is an anticoagulant (blood thinner). It is used to lower the chance of stroke in people with a medical condition called atrial fibrillation. It is also used to treat or prevent blood clots in the lungs or in the veins. This medicine may be used for other purposes; ask your health care provider or pharmacist if you have questions. COMMON BRAND NAME(S): Eliquis What should I tell my health care provider before I take this medicine? They need to know if you have any of these conditions: -bleeding disorders -bleeding in the brain -blood in your stools (black or tarry stools) or if you have blood in your vomit -history of stomach bleeding -kidney disease -liver disease -mechanical heart valve -an unusual or allergic reaction to apixaban, other medicines, foods, dyes, or preservatives -pregnant or trying to get pregnant -breast-feeding How should I use this medicine? Take this medicine by mouth with a glass of water. Follow the directions on the prescription label. You can take it with or without food. If it upsets your stomach, take it with food. Take your medicine at regular intervals. Do not take it more often than directed. Do not stop taking except on your doctor's  advice. Stopping this medicine may increase your risk of a blot clot. Be sure to refill your prescription before you run out of medicine. Talk to your pediatrician regarding the use of this medicine in children. Special care may be needed. Overdosage: If you think you have taken too much of this medicine contact a poison control center or emergency room at once. NOTE: This medicine is only for you. Do not share this medicine with others. What if I miss a dose? If you miss a dose, take it as soon as you can. If it is almost time for your next dose, take only that dose. Do not take double or extra doses. What may interact with this medicine? This medicine may interact with the following: -aspirin and aspirin-like medicines -certain medicines for fungal infections like ketoconazole and itraconazole -certain medicines for seizures like carbamazepine and phenytoin -certain medicines that treat or prevent blood clots like warfarin, enoxaparin, and dalteparin -clarithromycin -NSAIDs, medicines for pain and inflammation, like ibuprofen or naproxen -rifampin -ritonavir -St. John's wort This list may not describe all possible interactions. Give your health care provider a list of all the medicines, herbs, non-prescription drugs, or dietary supplements you use. Also tell them if you smoke, drink alcohol, or use illegal drugs. Some  items may interact with your medicine. What should I watch for while using this medicine? Notify your doctor or health care professional and seek emergency treatment if you develop breathing problems; changes in vision; chest pain; severe, sudden headache; pain, swelling, warmth in the leg; trouble speaking; sudden numbness or weakness of the face, arm, or leg. These can be signs that your condition has gotten worse. If you are going to have surgery, tell your doctor or health care professional that you are taking this medicine. Tell your health care professional that you use  this medicine before you have a spinal or epidural procedure. Sometimes people who take this medicine have bleeding problems around the spine when they have a spinal or epidural procedure. This bleeding is very rare. If you have a spinal or epidural procedure while on this medicine, call your health care professional immediately if you have back pain, numbness or tingling (especially in your legs and feet), muscle weakness, paralysis, or loss of bladder or bowel control. Avoid sports and activities that might cause injury while you are using this medicine. Severe falls or injuries can cause unseen bleeding. Be careful when using sharp tools or knives. Consider using an Copy. Take special care brushing or flossing your teeth. Report any injuries, bruising, or red spots on the skin to your doctor or health care professional. What side effects may I notice from receiving this medicine? Side effects that you should report to your doctor or health care professional as soon as possible: -allergic reactions like skin rash, itching or hives, swelling of the face, lips, or tongue -signs and symptoms of bleeding such as bloody or black, tarry stools; red or dark-brown urine; spitting up blood or brown material that looks like coffee grounds; red spots on the skin; unusual bruising or bleeding from the eye, gums, or nose This list may not describe all possible side effects. Call your doctor for medical advice about side effects. You may report side effects to FDA at 1-800-FDA-1088. Where should I keep my medicine? Keep out of the reach of children. Store at room temperature between 20 and 25 degrees C (68 and 77 degrees F). Throw away any unused medicine after the expiration date. NOTE: This sheet is a summary. It may not cover all possible information. If you have questions about this medicine, talk to your doctor, pharmacist, or health care provider.  2015, Elsevier/[employer] Standard. (2012-09-20  11:59:24)

## 2014-11-06 ENCOUNTER — Ambulatory Visit: Payer: Self-pay | Admitting: Physician Assistant

## 2014-11-06 ENCOUNTER — Ambulatory Visit (INDEPENDENT_AMBULATORY_CARE_PROVIDER_SITE_OTHER): Payer: Commercial Managed Care - HMO

## 2014-11-06 DIAGNOSIS — I482 Chronic atrial fibrillation, unspecified: Secondary | ICD-10-CM

## 2014-11-06 LAB — POCT INR
INR: 1.8
PT: 21.9

## 2014-11-06 NOTE — Patient Instructions (Signed)
Take Coumadin 4 mg on Fridays, Saturdays, and Sundays and then 3 mg on Mondays, Tuesdays, Wednesdays, and Thursdays. Call if any questions or concerns.

## 2014-11-09 ENCOUNTER — Other Ambulatory Visit: Payer: Self-pay | Admitting: Family Medicine

## 2014-11-13 ENCOUNTER — Ambulatory Visit (INDEPENDENT_AMBULATORY_CARE_PROVIDER_SITE_OTHER): Payer: Commercial Managed Care - HMO | Admitting: Physician Assistant

## 2014-11-13 ENCOUNTER — Encounter: Payer: Self-pay | Admitting: Physician Assistant

## 2014-11-13 VITALS — BP 114/60 | HR 79 | Temp 97.7°F | Resp 16 | Wt 233.2 lb

## 2014-11-13 DIAGNOSIS — I482 Chronic atrial fibrillation, unspecified: Secondary | ICD-10-CM

## 2014-11-13 DIAGNOSIS — J40 Bronchitis, not specified as acute or chronic: Secondary | ICD-10-CM | POA: Diagnosis not present

## 2014-11-13 DIAGNOSIS — J0120 Acute ethmoidal sinusitis, unspecified: Secondary | ICD-10-CM

## 2014-11-13 DIAGNOSIS — R600 Localized edema: Secondary | ICD-10-CM

## 2014-11-13 LAB — POCT INR: INR: 2.4

## 2014-11-13 MED ORDER — AMOXICILLIN 500 MG PO CAPS
500.0000 mg | ORAL_CAPSULE | Freq: Three times a day (TID) | ORAL | Status: DC
Start: 2014-11-13 — End: 2014-12-02

## 2014-11-13 NOTE — Patient Instructions (Signed)
**  Take 3mg  Warfarin everyday except Tuesday and Thursday.  On Tuesday and Thursday take 4mg  Warfarin.  We will recheck INR in 2 weeks.**  Vitamin K Foods and Warfarin Warfarin is a medicine that helps prevent harmful blood clots by causing blood to clot more slowly. It does this by decreasing the activity of vitamin K, which promotes normal blood clotting. For the dose of warfarin you have been prescribed to work well, you need to get about the same amount of vitamin K from your food from day to day. Suddenly getting a lot more vitamin K could cause your blood to clot too quickly. A sudden decrease in vitamin K intake could cause your blood to clot too slowly. These changes in vitamin K intake could lead to dangerous blood clotsor to bleeding. WHAT GENERAL GUIDELINES DO I NEED TO FOLLOW?  Keep your intake of vitamin K consistent from day to day. To do this, you must be aware of which foods contain moderate or high amounts of vitamin K. Listed below are some foods that are very high, high, or moderately high in vitamin K. If you eat these foods, make sure you eat a consistent amount of them from day to day.  Avoid major changes in your diet, or tell your health care provider before changing your diet.  If you take a multivitamin that contains vitamin K, be sure to take it every day.  If you drink green tea, drink the same amount each day. WHAT FOODS ARE VERY HIGH IN VITAMIN K?   Greens, such as Swiss chard and beet, collard, mustard, or turnip greens (fresh or frozen, cooked).  Kale (fresh or frozen, cooked).   Parsley (raw).  Spinach (cooked).  WHAT FOODS ARE HIGH IN VITAMIN K?  Asparagus (frozen, cooked).  Broccoli.   Bok choy (cooked).   Brussels sprouts (fresh or frozen, cooked).  Cabbage (cooked).  Coleslaw. WHAT FOODS ARE MODERATELY HIGH IN VITAMIN K?  Blueberries.  Black-eyed peas.  Endive (raw).   Green leaf lettuce (raw).   Green scallions (raw).  Kale  (raw).  Okra (frozen, cooked).  Plantains (fried).  Romaine lettuce (raw).   Sauerkraut (canned).   Spinach (raw).   This information is not intended to replace advice given to you by your health care provider. Make sure you discuss any questions you have with your health care provider.   Document Released: 11/13/2008 Document Revised: 02/06/2014 Document Reviewed: 11/20/2012 Elsevier Interactive Patient Education [employer].

## 2014-11-13 NOTE — Progress Notes (Signed)
Patient: Tracy Mann Female    DOB: 1933-06-28   81 y.o.   MRN: [id number] Visit Date: 11/13/2014  Today's Provider: Mar Daring, PA-C   No chief complaint on file.  Subjective:    HPI Tracy Mann is a 81 year old female that returns to the office today for her PT INR check and also to follow-up her sinusitis and bronchitis. She states that the cough has lessened and she only has her cough mostly in the mornings now. She states she has been using her Combivent inhaler. She does still continue to have some sinus pressure located between her eyes. She states this has made her feel very weak and tired. She denies any fevers, chills, nausea or vomiting. She also denies any chest pain or abnormal shortness of breath. She did take the Levaquin 500 mg to completion. She states it did not help that much.  She also has a complaint today of some increasing lower extremity edema. She currently is on furosemide 20 mg and states she has only been taking it once daily. Per review of her chart it does seem she should be taking this twice daily.  She also is here today for a recheck of her PT INR. Last week her INR was 1.4. She was not taking her warfarin as it had been prescribed. She states she was taking 3 mg daily and if she ate a salad she would then take 4 mg.    Allergies  Allergen Reactions  . Clarithromycin     Hives, headaches, hard time swelling, felt like throat was closing up  . Diphenhydramine   . Estrogens Conjugated   . Sulfonamide Derivatives Swelling    'tongue swelling'   Previous Medications   APIXABAN (ELIQUIS) 5 MG TABS TABLET    Take 1 tablet PO BID.  Do not start medication until 10/30/14.   COMBIVENT RESPIMAT 20-100 MCG/ACT AERS RESPIMAT    1 puff every 6 (six) hours as needed.    FLUTICASONE (FLONASE) 50 MCG/ACT NASAL SPRAY    2 (TWO) SPRAYS INTRANASALLY SPRAYS INTRANASALLY, NASAL, DAILY   FUROSEMIDE (LASIX) 20 MG TABLET    Take 2 tablets once daily   HYDROCODONE-ACETAMINOPHEN (NORCO) 10-325 MG PER TABLET    Take 1 tablet by mouth every 6 (six) hours as needed. On 10/02/2014   INSULIN GLARGINE (LANTUS) 100 UNIT/ML INJECTION    Inject 22 Units into the skin at bedtime.   INSULIN LISPRO (HUMALOG) 100 UNIT/ML KIWKPEN    Inject up to 10 units three times daily before each meal as directed   LANTUS SOLOSTAR 100 UNIT/ML SOLOSTAR PEN       LEVOFLOXACIN (LEVAQUIN) 500 MG TABLET    Take 1 tablet (500 mg total) by mouth daily.   LEVOTHYROXINE SODIUM 88 MCG CAPS    Take 88 mcg by mouth daily.   LISINOPRIL (PRINIVIL,ZESTRIL) 2.5 MG TABLET    Take 1 tablet (2.5 mg total) by mouth daily.   LORAZEPAM (ATIVAN) 1 MG TABLET    TAKE 1 TABLET BY MOUTH TWICE A DAY AS NEEDED FOR ANXIETY   MAGNESIUM SULFATE 50 % INJECTION    Take by mouth.   METOPROLOL SUCCINATE (TOPROL-XL) 25 MG 24 HR TABLET    Take 1 tablet (25 mg total) by mouth 2 (two) times daily.   NITROGLYCERIN (NITROSTAT) 0.4 MG SL TABLET    Place 1 tablet (0.4 mg total) under the tongue every 5 (five) minutes as needed. May repeat for  up to 3 doses.   PHENAZOPYRIDINE (PYRIDIUM) 100 MG TABLET    Take 100 mg by mouth as needed.     ZETIA 10 MG TABLET    Take 1 tablet by mouth Daily.    Review of Systems  Constitutional: Negative.   HENT: Positive for congestion, ear pain, postnasal drip, rhinorrhea and sinus pressure. Negative for hearing loss, sneezing, sore throat, tinnitus, trouble swallowing and voice change.   Eyes: Negative for photophobia, pain, discharge, redness, itching and visual disturbance.  Respiratory: Positive for cough (mostly in the morning) and wheezing (occasional). Negative for chest tightness and shortness of breath.   Cardiovascular: Positive for leg swelling. Negative for chest pain and palpitations.  Gastrointestinal: Negative for nausea and vomiting.  Neurological: Negative for dizziness, syncope, weakness, light-headedness, numbness and headaches.    Social History  Substance  Use Topics  . Smoking status: Never Smoker   . Smokeless tobacco: Never Used  . Alcohol Use: No   Objective:   There were no vitals taken for this visit.  Physical Exam  Constitutional: She appears well-developed and well-nourished. No distress.  HENT:  Head: Normocephalic and atraumatic.  Right Ear: Hearing, tympanic membrane, external ear and ear canal normal.  Left Ear: Hearing, tympanic membrane, external ear and ear canal normal.  Nose: Right sinus exhibits maxillary sinus tenderness. Right sinus exhibits no frontal sinus tenderness. Left sinus exhibits maxillary sinus tenderness. Left sinus exhibits no frontal sinus tenderness.  Mouth/Throat: Uvula is midline, oropharynx is clear and moist and mucous membranes are normal. No oropharyngeal exudate.  Eyes: Conjunctivae are normal. Pupils are equal, round, and reactive to light. Right eye exhibits no discharge. Left eye exhibits no discharge. No scleral icterus.  Neck: Normal range of motion. Neck supple. No JVD present. Carotid bruit is not present. No tracheal deviation present. No Brudzinski's sign and no Kernig's sign noted. No thyromegaly present.  Cardiovascular: Normal rate and normal heart sounds.  An irregularly irregular rhythm present. Exam reveals no gallop and no friction rub.   No murmur heard. Pulmonary/Chest: Effort normal. No accessory muscle usage or stridor. No respiratory distress. She has no decreased breath sounds. She has wheezes in the right lower field and the left lower field. She has rales in the right upper field. She exhibits no tenderness.  Musculoskeletal: She exhibits edema (1+ pitting edema bilateral lower extremities).  Lymphadenopathy:    She has no cervical adenopathy.  Skin: Skin is warm and dry. She is not diaphoretic.  Vitals reviewed.       Assessment & Plan:     1. Chronic atrial fibrillation (HCC) INR today was 2.4. Advised her to take warfarin 3 mg every day except Tuesday and Thursday.  On Tuesday and Thursday she is to take 4 mg. We will recheck her PT/INR in 2 weeks time. She has a follow-up with Dr. Rosanna Randy on November 2 and I advised her to just have her PT/INR checked on that date since she will be out of town week prior. She is to call the office if she has any questions or concerns or worsening symptoms. - POCT INR  2. Acute ethmoidal sinusitis, recurrence not specified Completed a course of Levaquin 500 milligrams. She stated this did not help her symptoms completely. She states that back in May Dr. Rosanna Randy had given her amoxicillin that seemed to help well. Should like to try round that this instead. I did call in amoxicillin to [place] as below. She is to call  the office if symptoms fail to improve or worsen in the meantime. She will have a follow-up on November 2 with Dr. Rosanna Randy. - amoxicillin (AMOXIL) 500 MG capsule; Take 1 capsule (500 mg total) by mouth 3 (three) times daily.  Dispense: 30 capsule; Refill: 0  3. Bronchitis Lung sounds have not worsened since previous exam. I did advise that I would have liked to gotten a chest x-ray today. She inclined stating that she wanted to make sure she had a bill for a previous chest x-ray pain for before she got a new chest x-ray. I advised her that if her cough or shortness of breath were to increase that she needed to call the office or call 911 if it was after hours. She voiced understanding. I will treat with the amoxicillin as above. She also has her Combivent inhaler. She has a follow-up in 2 weeks with Dr. Rosanna Randy on November 2. She states that when he does her lung exam if she still has abnormal lung sounds that she would be more willing to get that x-ray at that time. If symptoms continue I do recommend a chest x-ray to rule out any pneumonia.  4. Bilateral lower extremity edema Worsening lower extremity edema and most likely secondary to inactivity as well as only taking one furosemide daily. She is supposed to be  on two 20 mg pills of furosemide daily. I advised her to make sure to keep her legs elevated when she is at rest. I also advised her to make sure that she is walking around her house regularly to keep the fluid moving. I also instructed her to start taking the 2 tabs of furosemide daily instead of just 1. She is to call the office if her swelling continues to increase or if she develops the swelling with increasing shortness of breath. She voiced understanding. She does have a follow-up with Dr. Rosanna Randy on November 2 where this can be rechecked.       Mar Daring, PA-C  [facility]

## 2014-11-19 ENCOUNTER — Ambulatory Visit: Payer: Commercial Managed Care - HMO | Admitting: Family Medicine

## 2014-11-24 ENCOUNTER — Encounter: Payer: Self-pay | Admitting: Family Medicine

## 2014-11-24 ENCOUNTER — Ambulatory Visit (INDEPENDENT_AMBULATORY_CARE_PROVIDER_SITE_OTHER): Payer: Commercial Managed Care - HMO | Admitting: Family Medicine

## 2014-11-24 VITALS — BP 112/62 | HR 79 | Temp 98.4°F | Resp 16 | Wt 233.0 lb

## 2014-11-24 DIAGNOSIS — L02419 Cutaneous abscess of limb, unspecified: Secondary | ICD-10-CM

## 2014-11-24 DIAGNOSIS — L03119 Cellulitis of unspecified part of limb: Secondary | ICD-10-CM | POA: Diagnosis not present

## 2014-11-24 MED ORDER — AMOXICILLIN-POT CLAVULANATE 875-125 MG PO TABS: 1.0000 | ORAL_TABLET | Freq: Two times a day (BID) | ORAL | Status: DC

## 2014-11-24 MED ORDER — CEFTRIAXONE SODIUM 500 MG IJ SOLR
500.0000 mg | Freq: Once | INTRAMUSCULAR | Status: AC
  Administered 2014-11-24: 500 mg via INTRAMUSCULAR

## 2014-11-24 NOTE — Progress Notes (Signed)
Patient: Tracy Mann Female    DOB: 11/05/1933   81 y.o.   MRN: [id number] Visit Date: 11/24/2014  Today's Provider: Lelon Huh, MD   Chief Complaint  Patient presents with  . Hospitalization Follow-up  . Cellulitis    lower right leg   Subjective:    HPI   Follow up Hospitalization  Patient was admitted to  [facility] in [place] on 11/21/2014 and discharged on 11/22/2014. She was treated for Cellulitis on lower right leg with IV antibiotics and discharged on doxycycline.   She reports good compliance with treatment. She reports this condition is Unchanged. Patient is still having pain, redness and swelling in her right lower leg. Patient has wound wrapped in bandages and states she has been changing the dressing twice a day. Patient states upon discharge they also prescribed Lactobacillus Rhamnosus and Saccharomyces Boulardii lyo. Patient states she is not taking these two medications. She tried taking one of them but it made her head feel like it was going to explode and she felt dizzy. Patient states she has been taking the doxycycline that was prescribed.   ------------------------------------------------------------------------------------      Allergies  Allergen Reactions  . Clarithromycin     Hives, headaches, hard time swelling, felt like throat was closing up  . Diphenhydramine   . Estrogens Conjugated   . Sulfonamide Derivatives Swelling    'tongue swelling'   Previous Medications   AMOXICILLIN (AMOXIL) 500 MG CAPSULE    Take 1 capsule (500 mg total) by mouth 3 (three) times daily.   COMBIVENT RESPIMAT 20-100 MCG/ACT AERS RESPIMAT    1 puff every 6 (six) hours as needed.    DOXYCYCLINE (VIBRAMYCIN) 100 MG CAPSULE    Take 1 capsule by mouth 2 (two) times daily. For 10 days. For Cellulitis   FLUTICASONE (FLONASE) 50 MCG/ACT NASAL SPRAY    2 (TWO) SPRAYS INTRANASALLY SPRAYS INTRANASALLY, NASAL, DAILY   FUROSEMIDE (LASIX) 20 MG  TABLET    Take 2 tablets once daily   HYDROCODONE-ACETAMINOPHEN (NORCO) 10-325 MG PER TABLET    Take 1 tablet by mouth every 6 (six) hours as needed. On 10/02/2014   INSULIN GLARGINE (LANTUS) 100 UNIT/ML INJECTION    Inject 22 Units into the skin at bedtime.   INSULIN LISPRO (HUMALOG) 100 UNIT/ML KIWKPEN    Inject up to 10 units three times daily before each meal as directed   LANTUS SOLOSTAR 100 UNIT/ML SOLOSTAR PEN       LEVOTHYROXINE (SYNTHROID, LEVOTHROID) 100 MCG TABLET       LISINOPRIL (PRINIVIL,ZESTRIL) 2.5 MG TABLET    Take 1 tablet (2.5 mg total) by mouth daily.   LORAZEPAM (ATIVAN) 1 MG TABLET    TAKE 1 TABLET BY MOUTH TWICE A DAY AS NEEDED FOR ANXIETY   MAGNESIUM SULFATE 50 % INJECTION    Take by mouth.   METOPROLOL SUCCINATE (TOPROL-XL) 25 MG 24 HR TABLET    Take 1 tablet (25 mg total) by mouth 2 (two) times daily.   NITROGLYCERIN (NITROSTAT) 0.4 MG SL TABLET    Place 1 tablet (0.4 mg total) under the tongue every 5 (five) minutes as needed. May repeat for up to 3 doses.   PHENAZOPYRIDINE (PYRIDIUM) 100 MG TABLET    Take 100 mg by mouth as needed.     WARFARIN (COUMADIN) 3 MG TABLET    TAKE 1 TABLET (3 MG TOTAL) BY MOUTH DAILY.   WARFARIN (COUMADIN) 4 MG TABLET  ZETIA 10 MG TABLET    Take 1 tablet by mouth Daily.    Review of Systems  Constitutional: Positive for chills and diaphoresis. Negative for fever, appetite change and fatigue.  Respiratory: Negative for chest tightness and shortness of breath.   Cardiovascular: Positive for leg swelling. Negative for chest pain and palpitations.  Gastrointestinal: Negative for nausea, vomiting and abdominal pain.  Musculoskeletal: Positive for myalgias (burning pain in right lower leg).  Skin: Positive for wound.       Bloody watery drainage from right lower leg  Neurological: Positive for headaches. Negative for dizziness and weakness.    Social History  Substance Use Topics  . Smoking status: Never Smoker   . Smokeless tobacco:  Never Used  . Alcohol Use: No   Objective:   BP 112/62 mmHg  Pulse 79  Temp(Src) 98.4 F (36.9 C) (Oral)  Resp 16  Wt 233 lb (105.688 kg)  SpO2 94%  Physical Exam  General appearance: alert, well developed, well nourished, cooperative and in no distress Head: Normocephalic, without obvious abnormality, atraumatic Lungs: Respirations even and unlabored Extremities: About 2x3cm erosion right anterior lower leg draining small amount yellow discharge. Surrounded but 5-6 cm tender erythematous skin. No red streaks. About 3+ RLE edema.        Assessment & Plan:     1. Cellulitis and abscess of leg Not improving on doxycycline. Will culture wound, given 1000mg  ceftriaxone and change doxycydline to Augmentin 875 BID #20. Advised not to discard doxycycline until we review results of culture. Keep leg elevated - Wound culture - cefTRIAXone (ROCEPHIN) injection 500 mg; Inject 500 mg into the muscle once. - cefTRIAXone (ROCEPHIN) injection 500 mg; Inject 500 mg into the muscle once.  She already has regular follow up with Dr. Rosanna Randy on 12/02/2014, but advised may need to return sooner if not rapidly improving.       Lelon Huh, MD  [facility]

## 2014-11-25 ENCOUNTER — Telehealth: Payer: Self-pay

## 2014-11-25 NOTE — Telephone Encounter (Signed)
Patient notified. Patient expressed understanding.  

## 2014-11-25 NOTE — Telephone Encounter (Signed)
That's not unusually as it usually takes the antibiotic a few days to work. The redness should no be spreading anymore. If she does have any fevers, or if redness spreads anymore than she will need to go the hospital for IV antibiotics.

## 2014-11-25 NOTE — Telephone Encounter (Signed)
Patient is requesting a call back.

## 2014-11-25 NOTE — Telephone Encounter (Signed)
Patient called the office saying that she now has green pus draining from her wound. She wanted to know if this was normal. She denies any fever or chills. She is tolerating all medication well. She just noticed a discoloration in the drainage from her wound. Please call patient to let her know if this is ok. Thanks!

## 2014-11-27 LAB — WOUND CULTURE: ORGANISM ID, BACTERIA: NONE SEEN

## 2014-12-02 ENCOUNTER — Encounter: Payer: Self-pay | Admitting: Family Medicine

## 2014-12-02 ENCOUNTER — Other Ambulatory Visit: Payer: Self-pay | Admitting: Family Medicine

## 2014-12-02 ENCOUNTER — Ambulatory Visit (INDEPENDENT_AMBULATORY_CARE_PROVIDER_SITE_OTHER): Payer: Commercial Managed Care - HMO | Admitting: Family Medicine

## 2014-12-02 VITALS — BP 128/62 | HR 88 | Temp 98.8°F | Resp 16 | Wt 237.0 lb

## 2014-12-02 DIAGNOSIS — M150 Primary generalized (osteo)arthritis: Secondary | ICD-10-CM | POA: Diagnosis not present

## 2014-12-02 DIAGNOSIS — L03119 Cellulitis of unspecified part of limb: Secondary | ICD-10-CM | POA: Diagnosis not present

## 2014-12-02 DIAGNOSIS — M159 Polyosteoarthritis, unspecified: Secondary | ICD-10-CM

## 2014-12-02 DIAGNOSIS — I481 Persistent atrial fibrillation: Secondary | ICD-10-CM

## 2014-12-02 DIAGNOSIS — L02419 Cutaneous abscess of limb, unspecified: Secondary | ICD-10-CM

## 2014-12-02 DIAGNOSIS — I4819 Other persistent atrial fibrillation: Secondary | ICD-10-CM

## 2014-12-02 LAB — POCT INR
INR: 4.9
PT: 59.2

## 2014-12-02 MED ORDER — HYDROCODONE-ACETAMINOPHEN 10-325 MG PO TABS: 1.0000 | ORAL_TABLET | Freq: Four times a day (QID) | ORAL | Status: DC | PRN

## 2014-12-02 MED ORDER — AMOXICILLIN-POT CLAVULANATE 875-125 MG PO TABS: 1.0000 | ORAL_TABLET | Freq: Two times a day (BID) | ORAL | Status: DC

## 2014-12-02 NOTE — Progress Notes (Signed)
Patient ID: Tracy Mann, female   DOB: Aug 12, 1933, 81 y.o.   MRN: [id number]    Subjective:  HPI  Cellulitis follow up: Patient saw Dr. Caryn Section on October 26th and was given Augmentin (she still has 4 tablets daily) and was given Rocephin shot in the office. Sore on the right leg still there and it is still draining, not much better per patient. Pain is still there, it hurts to move the right leg. Dr. Caryn Section did a wound culture of the sore but patient states she had ointment on the area and is not sure if he really got the drainage from the sore-culture was negative.  Chronic pain: Pain is still there but Norco is helping.   Prior to Admission medications   Medication Sig Start Date End Date Taking? Authorizing Provider  ACCU-CHEK AVIVA PLUS test strip  11/23/14  Yes Historical Provider, MD  amoxicillin-clavulanate (AUGMENTIN) 875-125 MG tablet Take 1 tablet by mouth 2 (two) times daily. 11/24/14  Yes Birdie Sons, MD  COMBIVENT RESPIMAT 20-100 MCG/ACT AERS respimat 1 puff every 6 (six) hours as needed.  07/03/14  Yes Historical Provider, MD  fluticasone (FLONASE) 50 MCG/ACT nasal spray 2 (TWO) SPRAYS INTRANASALLY SPRAYS INTRANASALLY, NASAL, DAILY 06/01/14  Yes Historical Provider, MD  furosemide (LASIX) 20 MG tablet Take 2 tablets once daily 09/10/14  Yes Richard Maceo Pro., MD  HYDROcodone-acetaminophen [facility]) 10-325 MG per tablet Take 1 tablet by mouth every 6 (six) hours as needed. On 10/02/2014 08/25/14  Yes Richard L Cranford Mon., MD  insulin glargine (LANTUS) 100 UNIT/ML injection Inject 22 Units into the skin at bedtime.   Yes Historical Provider, MD  insulin lispro (HUMALOG) 100 UNIT/ML KiwkPen Inject up to 10 units three times daily before each meal as directed 10/23/13  Yes Historical Provider, MD  LANTUS SOLOSTAR 100 UNIT/ML Solostar Pen  06/16/14  Yes Historical Provider, MD  levothyroxine (SYNTHROID, LEVOTHROID) 100 MCG tablet  10/27/14  Yes Historical Provider, MD  lisinopril  (PRINIVIL,ZESTRIL) 2.5 MG tablet Take 1 tablet (2.5 mg total) by mouth daily. 08/11/13  Yes Minna Merritts, MD  LORazepam (ATIVAN) 1 MG tablet TAKE 1 TABLET BY MOUTH TWICE A DAY AS NEEDED FOR ANXIETY 09/01/14  Yes Jerrol Banana., MD  magnesium sulfate 50 % injection Take by mouth.   Yes Historical Provider, MD  metoprolol succinate (TOPROL-XL) 25 MG 24 hr tablet Take 1 tablet (25 mg total) by mouth 2 (two) times daily. 10/21/13  Yes Minna Merritts, MD  nitroGLYCERIN (NITROSTAT) 0.4 MG SL tablet Place 1 tablet (0.4 mg total) under the tongue every 5 (five) minutes as needed. May repeat for up to 3 doses. 08/05/10  Yes Minna Merritts, MD  phenazopyridine (PYRIDIUM) 100 MG tablet Take 100 mg by mouth as needed.     Yes Historical Provider, MD  warfarin (COUMADIN) 3 MG tablet TAKE 1 TABLET (3 MG TOTAL) BY MOUTH DAILY. 08/12/14  Yes Historical Provider, MD  warfarin (COUMADIN) 4 MG tablet  08/25/14  Yes Historical Provider, MD  ZETIA 10 MG tablet Take 1 tablet by mouth Daily. 02/07/11  Yes Historical Provider, MD    Patient Active Problem List   Diagnosis Date Noted  . Mixed Ischemic/Nonischemic Cardiomyopathy   . Type II diabetes mellitus ([place])   . Chronic atrial fibrillation ([place])   . Chronic combined systolic and diastolic CHF (congestive heart failure) ([place])   . Chronic anticoagulation   . Absolute anemia 07/08/2014  . Airway hyperreactivity  07/08/2014  . Cervical muscle strain 07/08/2014  . PNA (pneumonia) 07/08/2014  . Chest pressure 07/08/2014  . Clinical depression 07/08/2014  . Degeneration of lumbar or lumbosacral intervertebral disc 07/08/2014  . Accumulation of fluid in tissues 07/08/2014  . Anxiety, generalized 07/08/2014  . Acid reflux 07/08/2014  . Folliculitis 40/08/6759  . Adult hypothyroidism 07/08/2014  . Broken leg 07/08/2014  . Cannot sleep 07/08/2014  . Obstructive sleep apnea 07/08/2014  . Arthritis, degenerative 07/08/2014  . Onychia of finger 07/08/2014  .  Psoriasis 07/08/2014  . Restless legs syndrome 07/08/2014  . Adult BMI 30+ 07/08/2014  . Calculus of kidney 10/31/2011  . Bladder infection, chronic 10/31/2011  . Incomplete bladder emptying 10/31/2011  . Obesity 11/02/2010  . SHORTNESS OF BREATH 02/01/2010  . Hyperlipidemia 09/16/2008  . HYPERTENSION, HEART CONTROLLED W/O ASSOC CHF 09/16/2008  . CAD, NATIVE VESSEL 09/16/2008    Past Medical History  Diagnosis Date  . Chronic atrial fibrillation (HCC)     a. CHA2DS2VASc = 6--> warfarin.  . Chronic combined systolic and diastolic CHF (congestive heart failure) ([place])     a. 10/2012 Echo: EF 35-40%, diff HK, mild MR, mild biatrial enlargement;  b. 11/2012 EF 45-50% by LV gram; c. echo 07/2014: EF 40-45%, mod conc LVH, mod MR, severe biatrial enlargement, PASP 45 mm Hg  . Chronic anticoagulation     a. warfarin.  . Acute myocardial infarction of inferior wall ([place])     a. Presumed to be 2/2 to a coronary embolus in setting of persistent Afib-->Cath relatively nl cors, EF 45-50% w/ severe distal inferior/apical inferior HK w/ aneursymal appearance.  . Essential hypertension   . Obesity   . Hyperlipidemia, mixed   . Type II diabetes mellitus ([place])   . Mixed Ischemic/Nonischemic Cardiomyopathy     a. 10/2012 Echo: EF 35-40%;  b. 11/2012 EF 45-50% by LV gram.    Social History   Social History  . Marital Status: Widowed    Spouse Name: widowed  . Number of Children: 4  . Years of Education: 14   Occupational History  . retired    Social History Main Topics  . Smoking status: Never Smoker   . Smokeless tobacco: Never Used  . Alcohol Use: No  . Drug Use: No  . Sexual Activity: No   Other Topics Concern  . Not on file   Social History Narrative   Widowed   Has children and grandchildren   Lives in [place]  . Clarithromycin     Hives, headaches, hard time swelling, felt like throat was closing up  . Diphenhydramine   . Estrogens  Conjugated   . Sulfonamide Derivatives Swelling    'tongue swelling'    Review of Systems  Constitutional: Positive for chills and malaise/fatigue.  Respiratory: Negative.   Cardiovascular: Positive for leg swelling.  Gastrointestinal: Negative.   Musculoskeletal: Positive for myalgias, back pain and joint pain.  Neurological: Negative.   Endo/Heme/Allergies: Negative.   Psychiatric/Behavioral: Negative.      There is no immunization history on file for this patient. Objective:  BP 128/62 mmHg  Pulse 88  Temp(Src) 98.8 F (37.1 C)  Resp 16  Wt 237 lb (107.502 kg)  Physical Exam  Constitutional: She is well-developed, well-nourished, and in no distress.  HENT:  Head: Normocephalic and atraumatic.  Right Ear: External ear normal.  Left Ear: External ear normal.  Nose: Nose normal.  Eyes: Conjunctivae are normal. Pupils are equal,  round, and reactive to light.  Neck: Normal range of motion.  Cardiovascular: Normal rate, regular rhythm, normal heart sounds and intact distal pulses.   No murmur heard. Pulmonary/Chest: Effort normal and breath sounds normal. No respiratory distress. She has no wheezes.  Abdominal: Soft.  Musculoskeletal: She exhibits edema and tenderness.  Skin: Skin is warm and dry. There is erythema.  Psychiatric: Mood, memory, affect and judgment normal.    Lab Results  Component Value Date   WBC 5.0 01/23/2014   HGB 11.2* 01/23/2014   HCT 34.0* 01/23/2014   PLT 232 01/23/2014   GLUCOSE 122* 09/08/2014   TSH 1.340 07/14/2014   INR 2.4 11/13/2014   HGBA1C 6.2 09/08/2008    CMP     Component Value Date/Time   NA 139 09/08/2014 1219   NA 136 01/23/2014 0406   NA 137 11/22/2006 2309   K 4.5 09/08/2014 1219   K 4.4 01/23/2014 0406   CL 97 09/08/2014 1219   CL 100 01/23/2014 0406   CO2 27 09/08/2014 1219   CO2 30 01/23/2014 0406   GLUCOSE 122* 09/08/2014 1219   GLUCOSE 270* 01/23/2014 0406   GLUCOSE 104* 11/22/2006 2309   BUN 26  09/08/2014 1219   BUN 26* 01/23/2014 0406   BUN 17 11/22/2006 2309   CREATININE 0.63 09/08/2014 1219   CREATININE 0.94 01/23/2014 0406   CALCIUM 9.4 09/08/2014 1219   CALCIUM 8.7 01/23/2014 0406   PROT 8.1 05/06/2013 2206   ALBUMIN 4.0 09/08/2014 1219   ALBUMIN 3.5 05/06/2013 2206   AST 25 05/06/2013 2206   ALT 20 05/06/2013 2206   ALKPHOS 99 05/06/2013 2206   BILITOT 0.7 05/06/2013 2206   GFRNONAA 86 09/08/2014 1219   GFRNONAA >60 01/23/2014 0406   GFRNONAA >60 05/07/2013 0535   GFRAA 99 09/08/2014 1219   GFRAA >60 01/23/2014 0406   GFRAA >60 05/07/2013 0535    Assessment and Plan :  1. Primary osteoarthritis involving multiple joints RF x 3. Stable. - HYDROcodone-acetaminophen (NORCO) 10-325 MG tablet; Take 1 tablet by mouth every 6 (six) hours as needed. On 10/02/2014  Dispense: 120 tablet; Refill: 0 - HYDROcodone-acetaminophen (NORCO) 10-325 MG tablet; Take 1 tablet by mouth every 6 (six) hours as needed. On 10/02/2014  Dispense: 120 tablet; Refill: 0  2. Cellulitis and abscess of leg Improved. Advised patient to continue elevate her legs, apply heat to the back of her right leg. Will re check in 1 week. This is definitely improving but still very tender according to patient. I do think she has hyperalgesia due to long-term narcotic use.  3. Persistent atrial fibrillation (HCC) INR today was 4.9. Patient was taking her Coumadin incorrect. Patient advised to hold Coumadin until Saturday and then on Saturday start taking 3 mg daily, will re check level on the next visit. 4. Morbid obesity 5. Type 2 diabetes with neuropathy 6. CAD All risk factors treated and addressed. I have done the exam and reviewed the above chart and it is accurate to the best of my knowledge.  Patient was seen and examined by Dr. Eulas Post and note was scribed by Theressa Millard, RMA.    Miguel Aschoff MD [facility] 12/02/2014 1:56 PM

## 2014-12-09 ENCOUNTER — Ambulatory Visit: Payer: Commercial Managed Care - HMO | Admitting: Family Medicine

## 2014-12-09 ENCOUNTER — Ambulatory Visit (INDEPENDENT_AMBULATORY_CARE_PROVIDER_SITE_OTHER): Payer: Commercial Managed Care - HMO | Admitting: Family Medicine

## 2014-12-09 VITALS — BP 118/62 | HR 82 | Temp 98.6°F | Resp 16 | Wt 235.0 lb

## 2014-12-09 DIAGNOSIS — D2361 Other benign neoplasm of skin of right upper limb, including shoulder: Secondary | ICD-10-CM

## 2014-12-09 DIAGNOSIS — M159 Polyosteoarthritis, unspecified: Secondary | ICD-10-CM

## 2014-12-09 DIAGNOSIS — L03119 Cellulitis of unspecified part of limb: Secondary | ICD-10-CM | POA: Diagnosis not present

## 2014-12-09 DIAGNOSIS — D2261 Melanocytic nevi of right upper limb, including shoulder: Secondary | ICD-10-CM

## 2014-12-09 DIAGNOSIS — I482 Chronic atrial fibrillation, unspecified: Secondary | ICD-10-CM

## 2014-12-09 DIAGNOSIS — I10 Essential (primary) hypertension: Secondary | ICD-10-CM

## 2014-12-09 DIAGNOSIS — E669 Obesity, unspecified: Secondary | ICD-10-CM | POA: Diagnosis not present

## 2014-12-09 DIAGNOSIS — M150 Primary generalized (osteo)arthritis: Secondary | ICD-10-CM | POA: Diagnosis not present

## 2014-12-09 DIAGNOSIS — L02419 Cutaneous abscess of limb, unspecified: Secondary | ICD-10-CM

## 2014-12-09 NOTE — Progress Notes (Signed)
Patient ID: Tracy Mann, female   DOB: 08-Dec-1933, 81 y.o.   MRN: [id number]    Subjective:  HPI  Cellulitis of right lower leg follow up: Patient states her leg is better, redness is better and pain is better. She is still taking Augmentin.  Atrial fibrillation follow up: Her INR 1 week ago was 4.9, she was told at that time to hold Coumadin till Saturday November 5th and then restart at 3 mg daily. INR today is 1.8.  We have discussed working on habits in detail and what she can do.   Prior to Admission medications   Medication Sig Start Date End Date Taking? Authorizing Provider  ACCU-CHEK AVIVA PLUS test strip  11/23/14   Historical Provider, MD  amoxicillin-clavulanate (AUGMENTIN) 875-125 MG tablet Take 1 tablet by mouth 2 (two) times daily. 12/02/14   Richard Maceo Pro., MD  COMBIVENT RESPIMAT 20-100 MCG/ACT AERS respimat 1 puff every 6 (six) hours as needed.  07/03/14   Historical Provider, MD  fluticasone (FLONASE) 50 MCG/ACT nasal spray 2 (TWO) SPRAYS INTRANASALLY SPRAYS INTRANASALLY, NASAL, DAILY 06/01/14   Historical Provider, MD  furosemide (LASIX) 20 MG tablet Take 2 tablets once daily 09/10/14   Jerrol Banana., MD  HYDROcodone-acetaminophen [facility]) 10-325 MG tablet Take 1 tablet by mouth every 6 (six) hours as needed. On 10/02/2014 12/02/14   Jerrol Banana., MD  HYDROcodone-acetaminophen [facility]) 10-325 MG tablet Take 1 tablet by mouth every 6 (six) hours as needed. On 10/02/2014 12/02/14   Jerrol Banana., MD  insulin glargine (LANTUS) 100 UNIT/ML injection Inject 22 Units into the skin at bedtime.    Historical Provider, MD  insulin lispro (HUMALOG) 100 UNIT/ML KiwkPen Inject up to 10 units three times daily before each meal as directed 10/23/13   Historical Provider, MD  LANTUS SOLOSTAR 100 UNIT/ML Solostar Pen  06/16/14   Historical Provider, MD  levothyroxine (SYNTHROID, LEVOTHROID) 100 MCG tablet  10/27/14   Historical Provider, MD  lisinopril  (PRINIVIL,ZESTRIL) 2.5 MG tablet Take 1 tablet (2.5 mg total) by mouth daily. 08/11/13   Minna Merritts, MD  LORazepam (ATIVAN) 1 MG tablet TAKE 1 TABLET BY MOUTH TWICE A DAY AS NEEDED FOR ANXIETY 09/01/14   Jerrol Banana., MD  magnesium sulfate 50 % injection Take by mouth.    Historical Provider, MD  metoprolol succinate (TOPROL-XL) 25 MG 24 hr tablet Take 1 tablet (25 mg total) by mouth 2 (two) times daily. 10/21/13   Minna Merritts, MD  nitroGLYCERIN (NITROSTAT) 0.4 MG SL tablet Place 1 tablet (0.4 mg total) under the tongue every 5 (five) minutes as needed. May repeat for up to 3 doses. 08/05/10   Minna Merritts, MD  phenazopyridine (PYRIDIUM) 100 MG tablet Take 100 mg by mouth as needed.      Historical Provider, MD  warfarin (COUMADIN) 3 MG tablet TAKE 1 TABLET (3 MG TOTAL) BY MOUTH DAILY. 08/12/14   Historical Provider, MD  warfarin (COUMADIN) 4 MG tablet  08/25/14   Historical Provider, MD  ZETIA 10 MG tablet Take 1 tablet by mouth Daily. 02/07/11   Historical Provider, MD    Patient Active Problem List   Diagnosis Date Noted  . Mixed Ischemic/Nonischemic Cardiomyopathy   . Type II diabetes mellitus ([place])   . Chronic atrial fibrillation ([place])   . Chronic combined systolic and diastolic CHF (congestive heart failure) ([place])   . Chronic anticoagulation   . Absolute anemia 07/08/2014  .  Airway hyperreactivity 07/08/2014  . Cervical muscle strain 07/08/2014  . PNA (pneumonia) 07/08/2014  . Chest pressure 07/08/2014  . Clinical depression 07/08/2014  . Degeneration of lumbar or lumbosacral intervertebral disc 07/08/2014  . Accumulation of fluid in tissues 07/08/2014  . Anxiety, generalized 07/08/2014  . Acid reflux 07/08/2014  . Folliculitis 19/14/7829  . Adult hypothyroidism 07/08/2014  . Broken leg 07/08/2014  . Cannot sleep 07/08/2014  . Obstructive sleep apnea 07/08/2014  . Arthritis, degenerative 07/08/2014  . Onychia of finger 07/08/2014  . Psoriasis 07/08/2014  .  Restless legs syndrome 07/08/2014  . Adult BMI 30+ 07/08/2014  . Calculus of kidney 10/31/2011  . Bladder infection, chronic 10/31/2011  . Incomplete bladder emptying 10/31/2011  . Obesity 11/02/2010  . SHORTNESS OF BREATH 02/01/2010  . Hyperlipidemia 09/16/2008  . HYPERTENSION, HEART CONTROLLED W/O ASSOC CHF 09/16/2008  . CAD, NATIVE VESSEL 09/16/2008    Past Medical History  Diagnosis Date  . Chronic atrial fibrillation (HCC)     a. CHA2DS2VASc = 6--> warfarin.  . Chronic combined systolic and diastolic CHF (congestive heart failure) ([place])     a. 10/2012 Echo: EF 35-40%, diff HK, mild MR, mild biatrial enlargement;  b. 11/2012 EF 45-50% by LV gram; c. echo 07/2014: EF 40-45%, mod conc LVH, mod MR, severe biatrial enlargement, PASP 45 mm Hg  . Chronic anticoagulation     a. warfarin.  . Acute myocardial infarction of inferior wall ([place])     a. Presumed to be 2/2 to a coronary embolus in setting of persistent Afib-->Cath relatively nl cors, EF 45-50% w/ severe distal inferior/apical inferior HK w/ aneursymal appearance.  . Essential hypertension   . Obesity   . Hyperlipidemia, mixed   . Type II diabetes mellitus ([place])   . Mixed Ischemic/Nonischemic Cardiomyopathy     a. 10/2012 Echo: EF 35-40%;  b. 11/2012 EF 45-50% by LV gram.    Social History   Social History  . Marital Status: Widowed    Spouse Name: widowed  . Number of Children: 4  . Years of Education: 14   Occupational History  . retired    Social History Main Topics  . Smoking status: Never Smoker   . Smokeless tobacco: Never Used  . Alcohol Use: No  . Drug Use: No  . Sexual Activity: No   Other Topics Concern  . Not on file   Social History Narrative   Widowed   Has children and grandchildren   Lives in [place]  . Clarithromycin     Hives, headaches, hard time swelling, felt like throat was closing up  . Diphenhydramine   . Estrogens Conjugated   . Sulfonamide  Derivatives Swelling    'tongue swelling'    Review of Systems  Constitutional: Negative.   Eyes: Negative.   Respiratory: Negative.   Cardiovascular: Negative.   Gastrointestinal: Negative.   Genitourinary: Negative.   Musculoskeletal: Positive for back pain and joint pain.  Skin: Negative.   Neurological: Positive for dizziness (dizzy with postitional change like sitting up from laying position.).  Psychiatric/Behavioral: Negative.      There is no immunization history on file for this patient. Objective:  BP 118/62 mmHg  Pulse 82  Temp(Src) 98.6 F (37 C)  Resp 16  Wt 235 lb (106.595 kg)  Physical Exam  Constitutional: She is oriented to person, place, and time and well-developed, well-nourished, and in no distress.  HENT:  Head: Normocephalic and atraumatic.  Right  Ear: External ear normal.  Left Ear: External ear normal.  Nose: Nose normal.  Eyes: Conjunctivae are normal. Pupils are equal, round, and reactive to light.  Neck: Normal range of motion. Neck supple.  Cardiovascular: Normal rate, regular rhythm, normal heart sounds and intact distal pulses.   Pulmonary/Chest: Effort normal and breath sounds normal. No respiratory distress. She has no wheezes.  Abdominal: Soft.  Musculoskeletal: She exhibits edema.  Neurological: She is alert and oriented to person, place, and time.  Skin: Skin is warm and dry.  Lesion on right lower anterior leg at least 80% resolved./Improved.  Psychiatric: Mood, memory, affect and judgment normal.   in regular possible dysplastic nevus on the right medial upper forearm.  Lab Results  Component Value Date   WBC 5.0 01/23/2014   HGB 11.2* 01/23/2014   HCT 34.0* 01/23/2014   PLT 232 01/23/2014   GLUCOSE 122* 09/08/2014   TSH 1.340 07/14/2014   INR 4.9 12/02/2014   HGBA1C 6.2 09/08/2008    CMP     Component Value Date/Time   NA 139 09/08/2014 1219   NA 136 01/23/2014 0406   NA 137 11/22/2006 2309   K 4.5 09/08/2014 1219     K 4.4 01/23/2014 0406   CL 97 09/08/2014 1219   CL 100 01/23/2014 0406   CO2 27 09/08/2014 1219   CO2 30 01/23/2014 0406   GLUCOSE 122* 09/08/2014 1219   GLUCOSE 270* 01/23/2014 0406   GLUCOSE 104* 11/22/2006 2309   BUN 26 09/08/2014 1219   BUN 26* 01/23/2014 0406   BUN 17 11/22/2006 2309   CREATININE 0.63 09/08/2014 1219   CREATININE 0.94 01/23/2014 0406   CALCIUM 9.4 09/08/2014 1219   CALCIUM 8.7 01/23/2014 0406   PROT 8.1 05/06/2013 2206   ALBUMIN 4.0 09/08/2014 1219   ALBUMIN 3.5 05/06/2013 2206   AST 25 05/06/2013 2206   ALT 20 05/06/2013 2206   ALKPHOS 99 05/06/2013 2206   BILITOT 0.7 05/06/2013 2206   GFRNONAA 86 09/08/2014 1219   GFRNONAA >60 01/23/2014 0406   GFRNONAA >60 05/07/2013 0535   GFRAA 99 09/08/2014 1219   GFRAA >60 01/23/2014 0406   GFRAA >60 05/07/2013 0535    Assessment and Plan :  1. Cellulitis and abscess of leg 80% better. Continue to follow as needed.  2. Chronic atrial fibrillation (HCC) INR 1.8-continue 3 mg daily since patient is finishing her antibiotic and repeat 2 weeks.  3. Essential hypertension Stable.  4. Obesity Encouraged to work on habits.  5. Primary osteoarthritis involving multiple joints Stable.  6. Atypical nevus of upper arm, right New. - Ambulatory referral to Dermatology  7.DIzziness Seems orthostasis. Advised patient to be careful with sitting up or standing up. Push fluids. Follow as needed.    Patient was seen and examined by Dr. Eulas Post and note was scribed by Theressa Millard, RMA.   Miguel Aschoff MD [facility] 12/09/2014 3:38 PM

## 2014-12-11 LAB — POCT INR: INR: 1.8

## 2014-12-11 NOTE — Addendum Note (Signed)
Addended by: Arnette Norris on: 12/11/2014 09:47 AM   Modules accepted: Orders

## 2014-12-23 ENCOUNTER — Ambulatory Visit (INDEPENDENT_AMBULATORY_CARE_PROVIDER_SITE_OTHER): Payer: Commercial Managed Care - HMO

## 2014-12-23 DIAGNOSIS — I482 Chronic atrial fibrillation: Secondary | ICD-10-CM

## 2014-12-23 LAB — POCT INR
INR: 2.7
PT: 31.9

## 2014-12-23 NOTE — Patient Instructions (Signed)
Continue taking Coumadin 3 mg daily. Return in 1 month. Call if any questions or concerns.

## 2014-12-23 NOTE — Progress Notes (Signed)
Patient ID: Tracy Mann, female   DOB: 09-20-1933, 81 y.o.   MRN: [id number] Anticoagulation Dose Instructions as of 12/23/2014      Dorene Grebe Tue Wed Thu Fri Sat   New Dose 3 mg 3 mg 3 mg 3 mg 3 mg 4 mg 3 mg    Description        Continue taking Coumadin 3 mg daily. Return in 1 month.

## 2014-12-29 ENCOUNTER — Ambulatory Visit (INDEPENDENT_AMBULATORY_CARE_PROVIDER_SITE_OTHER): Payer: Commercial Managed Care - HMO | Admitting: Family Medicine

## 2014-12-29 VITALS — BP 128/60 | HR 74 | Temp 98.8°F | Resp 18 | Wt 239.0 lb

## 2014-12-29 DIAGNOSIS — I482 Chronic atrial fibrillation, unspecified: Secondary | ICD-10-CM

## 2014-12-29 DIAGNOSIS — L02419 Cutaneous abscess of limb, unspecified: Secondary | ICD-10-CM | POA: Diagnosis not present

## 2014-12-29 DIAGNOSIS — L03119 Cellulitis of unspecified part of limb: Secondary | ICD-10-CM | POA: Diagnosis not present

## 2014-12-29 DIAGNOSIS — E119 Type 2 diabetes mellitus without complications: Secondary | ICD-10-CM

## 2014-12-29 DIAGNOSIS — Z794 Long term (current) use of insulin: Secondary | ICD-10-CM

## 2014-12-29 MED ORDER — AMOXICILLIN-POT CLAVULANATE 875-125 MG PO TABS: 1.0000 | ORAL_TABLET | Freq: Two times a day (BID) | ORAL | Status: DC

## 2014-12-29 NOTE — Progress Notes (Signed)
Patient ID: Tracy Mann, female   DOB: 12/30/1933, 81 y.o.   MRN: [id number]    Subjective:  HPI  Patient is here to discuss right lower leg swelling. This is looking like when she was here last. Swelling present, redness, some sores present. She is taking Lasix 2 tablets daily usually. No fever but chills at times. This came up about a week ago.  Prior to Admission medications   Medication Sig Start Date End Date Taking? Authorizing Provider  ACCU-CHEK AVIVA PLUS test strip  11/23/14  Yes Historical Provider, MD  COMBIVENT RESPIMAT 20-100 MCG/ACT AERS respimat 1 puff every 6 (six) hours as needed.  07/03/14  Yes Historical Provider, MD  fluticasone (FLONASE) 50 MCG/ACT nasal spray 2 (TWO) SPRAYS INTRANASALLY SPRAYS INTRANASALLY, NASAL, DAILY 06/01/14  Yes Historical Provider, MD  furosemide (LASIX) 20 MG tablet Take 2 tablets once daily 09/10/14  Yes Kinga Cassar Maceo Pro., MD  HYDROcodone-acetaminophen [facility]) 10-325 MG tablet Take 1 tablet by mouth every 6 (six) hours as needed. On 10/02/2014 12/02/14  Yes Katrenia Alkins Maceo Pro., MD  HYDROcodone-acetaminophen [facility]) 10-325 MG tablet Take 1 tablet by mouth every 6 (six) hours as needed. On 10/02/2014 12/02/14  Yes Glorie Dowlen Maceo Pro., MD  insulin glargine (LANTUS) 100 UNIT/ML injection Inject 22 Units into the skin at bedtime.   Yes Historical Provider, MD  insulin lispro (HUMALOG) 100 UNIT/ML KiwkPen Inject up to 10 units three times daily before each meal as directed 10/23/13  Yes Historical Provider, MD  LANTUS SOLOSTAR 100 UNIT/ML Solostar Pen  06/16/14  Yes Historical Provider, MD  levothyroxine (SYNTHROID, LEVOTHROID) 100 MCG tablet  10/27/14  Yes Historical Provider, MD  lisinopril (PRINIVIL,ZESTRIL) 2.5 MG tablet Take 1 tablet (2.5 mg total) by mouth daily. 08/11/13  Yes Minna Merritts, MD  LORazepam (ATIVAN) 1 MG tablet TAKE 1 TABLET BY MOUTH TWICE A DAY AS NEEDED FOR ANXIETY 09/01/14  Yes Jerrol Banana., MD  magnesium sulfate 50 %  injection Take by mouth.   Yes Historical Provider, MD  metoprolol succinate (TOPROL-XL) 25 MG 24 hr tablet Take 1 tablet (25 mg total) by mouth 2 (two) times daily. 10/21/13  Yes Minna Merritts, MD  nitroGLYCERIN (NITROSTAT) 0.4 MG SL tablet Place 1 tablet (0.4 mg total) under the tongue every 5 (five) minutes as needed. May repeat for up to 3 doses. 08/05/10  Yes Minna Merritts, MD  phenazopyridine (PYRIDIUM) 100 MG tablet Take 100 mg by mouth as needed.     Yes Historical Provider, MD  warfarin (COUMADIN) 3 MG tablet TAKE 1 TABLET (3 MG TOTAL) BY MOUTH DAILY. 08/12/14  Yes Historical Provider, MD  warfarin (COUMADIN) 4 MG tablet  08/25/14  Yes Historical Provider, MD  ZETIA 10 MG tablet Take 1 tablet by mouth Daily. 02/07/11  Yes Historical Provider, MD    Patient Active Problem List   Diagnosis Date Noted  . Mixed Ischemic/Nonischemic Cardiomyopathy   . Type II diabetes mellitus ([place])   . Chronic atrial fibrillation ([place])   . Chronic combined systolic and diastolic CHF (congestive heart failure) ([place])   . Chronic anticoagulation   . Absolute anemia 07/08/2014  . Airway hyperreactivity 07/08/2014  . Cervical muscle strain 07/08/2014  . PNA (pneumonia) 07/08/2014  . Chest pressure 07/08/2014  . Clinical depression 07/08/2014  . Degeneration of lumbar or lumbosacral intervertebral disc 07/08/2014  . Accumulation of fluid in tissues 07/08/2014  . Anxiety, generalized 07/08/2014  . Acid reflux 07/08/2014  .  Folliculitis 99991111  . Adult hypothyroidism 07/08/2014  . Broken leg 07/08/2014  . Cannot sleep 07/08/2014  . Obstructive sleep apnea 07/08/2014  . Arthritis, degenerative 07/08/2014  . Onychia of finger 07/08/2014  . Psoriasis 07/08/2014  . Restless legs syndrome 07/08/2014  . Adult BMI 30+ 07/08/2014  . Calculus of kidney 10/31/2011  . Bladder infection, chronic 10/31/2011  . Incomplete bladder emptying 10/31/2011  . Obesity 11/02/2010  . SHORTNESS OF BREATH 02/01/2010    . Hyperlipidemia 09/16/2008  . HYPERTENSION, HEART CONTROLLED W/O ASSOC CHF 09/16/2008  . CAD, NATIVE VESSEL 09/16/2008  . Atrial fibrillation ([place]) 09/16/2008    Past Medical History  Diagnosis Date  . Chronic atrial fibrillation (HCC)     a. CHA2DS2VASc = 6--> warfarin.  . Chronic combined systolic and diastolic CHF (congestive heart failure) ([place])     a. 10/2012 Echo: EF 35-40%, diff HK, mild MR, mild biatrial enlargement;  b. 11/2012 EF 45-50% by LV gram; c. echo 07/2014: EF 40-45%, mod conc LVH, mod MR, severe biatrial enlargement, PASP 45 mm Hg  . Chronic anticoagulation     a. warfarin.  . Acute myocardial infarction of inferior wall ([place])     a. Presumed to be 2/2 to a coronary embolus in setting of persistent Afib-->Cath relatively nl cors, EF 45-50% w/ severe distal inferior/apical inferior HK w/ aneursymal appearance.  . Essential hypertension   . Obesity   . Hyperlipidemia, mixed   . Type II diabetes mellitus ([place])   . Mixed Ischemic/Nonischemic Cardiomyopathy     a. 10/2012 Echo: EF 35-40%;  b. 11/2012 EF 45-50% by LV gram.    Social History   Social History  . Marital Status: Widowed    Spouse Name: widowed  . Number of Children: 4  . Years of Education: 14   Occupational History  . retired    Social History Main Topics  . Smoking status: Never Smoker   . Smokeless tobacco: Never Used  . Alcohol Use: No  . Drug Use: No  . Sexual Activity: No   Other Topics Concern  . Not on file   Social History Narrative   Widowed   Has children and grandchildren   Lives in [place]  . Clarithromycin     Hives, headaches, hard time swelling, felt like throat was closing up  . Diphenhydramine   . Estrogens Conjugated   . Sulfonamide Derivatives Swelling    'tongue swelling'    Review of Systems  Constitutional: Positive for chills and malaise/fatigue. Negative for fever.  Respiratory: Positive for cough (chronic),  shortness of breath (chronic) and wheezing.   Cardiovascular: Positive for leg swelling.  Musculoskeletal: Positive for back pain and joint pain.  Neurological: Positive for weakness and headaches.     There is no immunization history on file for this patient. Objective:  BP 128/60 mmHg  Pulse 74  Temp(Src) 98.8 F (37.1 C)  Resp 18  Wt 239 lb (108.41 kg)  Physical Exam  Constitutional: She is oriented to person, place, and time and well-developed, well-nourished, and in no distress.  HENT:  Head: Normocephalic and atraumatic.  Eyes: Conjunctivae are normal. Pupils are equal, round, and reactive to light.  Neck: Normal range of motion. Neck supple.  Cardiovascular: Normal rate, regular rhythm, normal heart sounds and intact distal pulses.   No murmur heard. Pulmonary/Chest: Effort normal and breath sounds normal. No respiratory distress. She has no wheezes.  Musculoskeletal: She exhibits edema and tenderness.  Left calf is 17 inches in size and 18 inches in the right calf. Negative Homans,no cords.  Neurological: She is alert and oriented to person, place, and time.  Skin: There is erythema.  About 5 inch by 5 inch area of abrasion and weeping of wound. Mildly erythematous. No significant drainage.  Psychiatric: Mood, memory, affect and judgment normal.    Lab Results  Component Value Date   WBC 5.0 01/23/2014   HGB 11.2* 01/23/2014   HCT 34.0* 01/23/2014   PLT 232 01/23/2014   GLUCOSE 122* 09/08/2014   TSH 1.340 07/14/2014   INR 2.7 12/23/2014   HGBA1C 6.2 09/08/2008    CMP     Component Value Date/Time   NA 139 09/08/2014 1219   NA 136 01/23/2014 0406   NA 137 11/22/2006 2309   K 4.5 09/08/2014 1219   K 4.4 01/23/2014 0406   CL 97 09/08/2014 1219   CL 100 01/23/2014 0406   CO2 27 09/08/2014 1219   CO2 30 01/23/2014 0406   GLUCOSE 122* 09/08/2014 1219   GLUCOSE 270* 01/23/2014 0406   GLUCOSE 104* 11/22/2006 2309   BUN 26 09/08/2014 1219   BUN 26*  01/23/2014 0406   BUN 17 11/22/2006 2309   CREATININE 0.63 09/08/2014 1219   CREATININE 0.94 01/23/2014 0406   CALCIUM 9.4 09/08/2014 1219   CALCIUM 8.7 01/23/2014 0406   PROT 8.1 05/06/2013 2206   ALBUMIN 4.0 09/08/2014 1219   ALBUMIN 3.5 05/06/2013 2206   AST 25 05/06/2013 2206   ALT 20 05/06/2013 2206   ALKPHOS 99 05/06/2013 2206   BILITOT 0.7 05/06/2013 2206   GFRNONAA 86 09/08/2014 1219   GFRNONAA >60 01/23/2014 0406   GFRNONAA >60 05/07/2013 0535   GFRAA 99 09/08/2014 1219   GFRAA >60 01/23/2014 0406   GFRAA >60 05/07/2013 0535    Assessment and Plan :  1. Cellulitis of leg Recurrent. Will treat and go ahead and refer to Vascular specialist. - amoxicillin-clavulanate (AUGMENTIN) 875-125 MG tablet; Take 1 tablet by mouth 2 (two) times daily.  Dispense: 14 tablet; Refill: 0 - Ambulatory referral to Vascular Surgery I do not think wound referral would be of benefit. 2. Chronic atrial fibrillation (HCC) Stable.  3. Venostasis/chronic venous insufficiency Pt again instructed to elevate legs and to wear support hose. She stated she cannot get support hose on. Her age and Afib and obesity all contribute to this issue.Unlikely DVT. 4.Morbid Obesity 5.Chronic systolic and diastolic CHF I do not think she is in CHF presently and i am worried that overly aggressive diuresis will lead to renal damage.may  Need cardiology in future regarding this issue  Patient was seen and examined by Dr. Eulas Post and note was scribed by Theressa Millard, [place].    Miguel Aschoff MD [facility] 12/29/2014 3:06 PM

## 2014-12-31 ENCOUNTER — Other Ambulatory Visit: Payer: Self-pay | Admitting: Family Medicine

## 2014-12-31 NOTE — Telephone Encounter (Signed)
Pt wanted to make sure we received the refill request. Thanks TNP

## 2015-01-12 ENCOUNTER — Encounter: Payer: Self-pay | Admitting: Family Medicine

## 2015-01-12 ENCOUNTER — Ambulatory Visit (INDEPENDENT_AMBULATORY_CARE_PROVIDER_SITE_OTHER): Payer: Commercial Managed Care - HMO | Admitting: Family Medicine

## 2015-01-12 VITALS — BP 124/60 | HR 72 | Temp 97.4°F | Resp 12 | Wt 240.0 lb

## 2015-01-12 DIAGNOSIS — L03119 Cellulitis of unspecified part of limb: Secondary | ICD-10-CM | POA: Diagnosis not present

## 2015-01-12 DIAGNOSIS — Z794 Long term (current) use of insulin: Secondary | ICD-10-CM | POA: Diagnosis not present

## 2015-01-12 DIAGNOSIS — L02419 Cutaneous abscess of limb, unspecified: Secondary | ICD-10-CM | POA: Diagnosis not present

## 2015-01-12 DIAGNOSIS — E119 Type 2 diabetes mellitus without complications: Secondary | ICD-10-CM | POA: Diagnosis not present

## 2015-01-12 DIAGNOSIS — I482 Chronic atrial fibrillation, unspecified: Secondary | ICD-10-CM

## 2015-01-12 DIAGNOSIS — K219 Gastro-esophageal reflux disease without esophagitis: Secondary | ICD-10-CM

## 2015-01-12 NOTE — Progress Notes (Signed)
Patient ID: Tracy Mann, female   DOB: 09/05/1933, 81 y.o.   MRN: [id number]    Subjective:  HPI  Cellulitis follow up. Last office visit was on November 23rd. Augmentin was started and she finished this. Cellulitis has improved. She did see vascular specialist on December 8th and was advised to try support hose for 2 months and then if there is no improvement to try a pump (new things). Right lower leg is much better. No fever no chills.  Prior to Admission medications   Medication Sig Start Date End Date Taking? Authorizing Provider  ACCU-CHEK AVIVA PLUS test strip  11/23/14  Yes Historical Provider, MD  amoxicillin-clavulanate (AUGMENTIN) 875-125 MG tablet Take 1 tablet by mouth 2 (two) times daily. 12/29/14  Yes Akeisha Lagerquist Maceo Pro., MD  COMBIVENT RESPIMAT 20-100 MCG/ACT AERS respimat 1 puff every 6 (six) hours as needed.  07/03/14  Yes Historical Provider, MD  fluticasone (FLONASE) 50 MCG/ACT nasal spray 2 (TWO) SPRAYS INTRANASALLY SPRAYS INTRANASALLY, NASAL, DAILY 06/01/14  Yes Historical Provider, MD  furosemide (LASIX) 20 MG tablet Take 2 tablets once daily 09/10/14  Yes Tilia Faso Maceo Pro., MD  HYDROcodone-acetaminophen [facility]) 10-325 MG tablet Take 1 tablet by mouth every 6 (six) hours as needed. On 10/02/2014 12/02/14  Yes Dave Mergen Maceo Pro., MD  HYDROcodone-acetaminophen [facility]) 10-325 MG tablet Take 1 tablet by mouth every 6 (six) hours as needed. On 10/02/2014 12/02/14  Yes Parissa Chiao Maceo Pro., MD  insulin glargine (LANTUS) 100 UNIT/ML injection Inject 22 Units into the skin at bedtime.   Yes Historical Provider, MD  insulin lispro (HUMALOG KWIKPEN) 100 UNIT/ML KiwkPen INJECT UP TO 10 UNITS SUBCUTANEOUSLY THREE TIMES DAILY BEFORE EACH MEAL(S) AS DIRECTED 01/04/15  Yes Historical Provider, MD  insulin lispro (HUMALOG) 100 UNIT/ML KiwkPen Inject up to 10 units three times daily before each meal as directed 10/23/13  Yes Historical Provider, MD  LANTUS SOLOSTAR 100 UNIT/ML Solostar Pen   06/16/14  Yes Historical Provider, MD  levothyroxine (SYNTHROID, LEVOTHROID) 100 MCG tablet  10/27/14  Yes Historical Provider, MD  lisinopril (PRINIVIL,ZESTRIL) 2.5 MG tablet Take 1 tablet (2.5 mg total) by mouth daily. 08/11/13  Yes Minna Merritts, MD  LORazepam (ATIVAN) 1 MG tablet TAKE 1 TABLET TWICE A DAY AS NEEDED FOR ANXIETY 12/31/14  Yes Jerrol Banana., MD  magnesium sulfate 50 % injection Take by mouth.   Yes Historical Provider, MD  metoprolol succinate (TOPROL-XL) 25 MG 24 hr tablet Take 1 tablet (25 mg total) by mouth 2 (two) times daily. 10/21/13  Yes Minna Merritts, MD  nitroGLYCERIN (NITROSTAT) 0.4 MG SL tablet Place 1 tablet (0.4 mg total) under the tongue every 5 (five) minutes as needed. May repeat for up to 3 doses. 08/05/10  Yes Minna Merritts, MD  phenazopyridine (PYRIDIUM) 100 MG tablet Take 100 mg by mouth as needed.     Yes Historical Provider, MD  warfarin (COUMADIN) 3 MG tablet TAKE 1 TABLET (3 MG TOTAL) BY MOUTH DAILY. 08/12/14  Yes Historical Provider, MD  warfarin (COUMADIN) 4 MG tablet  08/25/14  Yes Historical Provider, MD  ZETIA 10 MG tablet Take 1 tablet by mouth Daily. 02/07/11  Yes Historical Provider, MD    Patient Active Problem List   Diagnosis Date Noted  . Mixed Ischemic/Nonischemic Cardiomyopathy   . Type II diabetes mellitus ([place])   . Chronic atrial fibrillation ([place])   . Chronic combined systolic and diastolic CHF (congestive heart failure) ([place])   .  Chronic anticoagulation   . Absolute anemia 07/08/2014  . Airway hyperreactivity 07/08/2014  . Cervical muscle strain 07/08/2014  . PNA (pneumonia) 07/08/2014  . Chest pressure 07/08/2014  . Clinical depression 07/08/2014  . Degeneration of lumbar or lumbosacral intervertebral disc 07/08/2014  . Accumulation of fluid in tissues 07/08/2014  . Anxiety, generalized 07/08/2014  . Acid reflux 07/08/2014  . Folliculitis [id number]  . Adult hypothyroidism 07/08/2014  . Broken leg 07/08/2014  . Cannot  sleep 07/08/2014  . Obstructive sleep apnea 07/08/2014  . Arthritis, degenerative 07/08/2014  . Onychia of finger 07/08/2014  . Psoriasis 07/08/2014  . Restless legs syndrome 07/08/2014  . Adult BMI 30+ 07/08/2014  . Calculus of kidney 10/31/2011  . Bladder infection, chronic 10/31/2011  . Incomplete bladder emptying 10/31/2011  . Obesity 11/02/2010  . SHORTNESS OF BREATH 02/01/2010  . Hyperlipidemia 09/16/2008  . HYPERTENSION, HEART CONTROLLED W/O ASSOC CHF 09/16/2008  . CAD, NATIVE VESSEL 09/16/2008  . Atrial fibrillation ([place]) 09/16/2008    Past Medical History  Diagnosis Date  . Chronic atrial fibrillation (HCC)     a. CHA2DS2VASc = 6--> warfarin.  . Chronic combined systolic and diastolic CHF (congestive heart failure) ([place])     a. 10/2012 Echo: EF 35-40%, diff HK, mild MR, mild biatrial enlargement;  b. 11/2012 EF 45-50% by LV gram; c. echo 07/2014: EF 40-45%, mod conc LVH, mod MR, severe biatrial enlargement, PASP 45 mm Hg  . Chronic anticoagulation     a. warfarin.  . Acute myocardial infarction of inferior wall ([place])     a. Presumed to be 2/2 to a coronary embolus in setting of persistent Afib-->Cath relatively nl cors, EF 45-50% w/ severe distal inferior/apical inferior HK w/ aneursymal appearance.  . Essential hypertension   . Obesity   . Hyperlipidemia, mixed   . Type II diabetes mellitus ([place])   . Mixed Ischemic/Nonischemic Cardiomyopathy     a. 10/2012 Echo: EF 35-40%;  b. 11/2012 EF 45-50% by LV gram.    Social History   Social History  . Marital Status: Widowed    Spouse Name: widowed  . Number of Children: 4  . Years of Education: 14   Occupational History  . retired    Social History Main Topics  . Smoking status: Never Smoker   . Smokeless tobacco: Never Used  . Alcohol Use: No  . Drug Use: No  . Sexual Activity: No   Other Topics Concern  . Not on file   Social History Narrative   Widowed   Has children and grandchildren   Lives in  [place]  . Clarithromycin     Hives, headaches, hard time swelling, felt like throat was closing up  . Diphenhydramine   . Estrogens Conjugated   . Sulfonamide Derivatives Swelling    'tongue swelling'    Review of Systems  Constitutional: Negative.   Respiratory: Negative.   Cardiovascular: Positive for leg swelling.  Gastrointestinal: Negative.   Musculoskeletal: Positive for back pain and joint pain.     There is no immunization history on file for this patient. Objective:  BP 124/60 mmHg  Pulse 72  Temp(Src) 97.4 F (36.3 C)  Resp 12  Wt 240 lb (108.863 kg)  Physical Exam  Constitutional: She is oriented to person, place, and time and well-developed, well-nourished, and in no distress.  HENT:  Head: Normocephalic and atraumatic.  Eyes: Conjunctivae are normal. Pupils are equal, round, and reactive to light.  Neck:  Normal range of motion. Neck supple.  Cardiovascular: Normal rate, regular rhythm, normal heart sounds and intact distal pulses.   Pulmonary/Chest: Effort normal and breath sounds normal. No respiratory distress. She has no wheezes. She has no rales.  Musculoskeletal: She exhibits edema.  Neurological: She is alert and oriented to person, place, and time.  Skin: Skin is warm and dry. Rash noted. No erythema.  There is no broken skin on the right lower extremity and the rash from the cellulitis is almost completely resolved.  Psychiatric: Mood, memory, affect and judgment normal.    Lab Results  Component Value Date   WBC 5.0 01/23/2014   HGB 11.2* 01/23/2014   HCT 34.0* 01/23/2014   PLT 232 01/23/2014   GLUCOSE 122* 09/08/2014   TSH 1.340 07/14/2014   INR 2.7 12/23/2014   HGBA1C 6.2 09/08/2008    CMP     Component Value Date/Time   NA 139 09/08/2014 1219   NA 136 01/23/2014 0406   NA 137 11/22/2006 2309   K 4.5 09/08/2014 1219   K 4.4 01/23/2014 0406   CL 97 09/08/2014 1219   CL 100 01/23/2014 0406    CO2 27 09/08/2014 1219   CO2 30 01/23/2014 0406   GLUCOSE 122* 09/08/2014 1219   GLUCOSE 270* 01/23/2014 0406   GLUCOSE 104* 11/22/2006 2309   BUN 26 09/08/2014 1219   BUN 26* 01/23/2014 0406   BUN 17 11/22/2006 2309   CREATININE 0.63 09/08/2014 1219   CREATININE 0.94 01/23/2014 0406   CALCIUM 9.4 09/08/2014 1219   CALCIUM 8.7 01/23/2014 0406   PROT 8.1 05/06/2013 2206   ALBUMIN 4.0 09/08/2014 1219   ALBUMIN 3.5 05/06/2013 2206   AST 25 05/06/2013 2206   ALT 20 05/06/2013 2206   ALKPHOS 99 05/06/2013 2206   BILITOT 0.7 05/06/2013 2206   GFRNONAA 86 09/08/2014 1219   GFRNONAA >60 01/23/2014 0406   GFRNONAA >60 05/07/2013 0535   GFRAA 99 09/08/2014 1219   GFRAA >60 01/23/2014 0406   GFRAA >60 05/07/2013 0535    Assessment and Plan :  1. Cellulitis and abscess of leg Improved. Continue following with Dr. Delana Meyer.  2. Chronic atrial fibrillation ([place]) Seen Dr.[name] in February.  3. Type 2 diabetes mellitus without complication, with long-term current use of insulin (HCC) Check in 2 to 3 months.  4. Gastroesophageal reflux disease, esophagitis presence not specified 5. Chronic venous insufficiency Elevate feet, use support hose, follow-up with Dr. Waldron [facility] in a few months 6. Morbid obesity Overall this is a major issue for this patient  [facility] 01/12/2015 2:03 PM

## 2015-01-20 ENCOUNTER — Ambulatory Visit (INDEPENDENT_AMBULATORY_CARE_PROVIDER_SITE_OTHER): Payer: Commercial Managed Care - HMO

## 2015-01-20 DIAGNOSIS — I482 Chronic atrial fibrillation, unspecified: Secondary | ICD-10-CM

## 2015-01-20 LAB — POCT INR
INR: 1.7
PT: 20.2

## 2015-01-20 NOTE — Patient Instructions (Signed)
Anticoagulation Dose Instructions as of 01/20/2015      Dorene Grebe Tue Wed Thu Fri Sat   New Dose 3 mg 4 mg 3 mg 4 mg 3 mg 4 mg 3 mg    Description        Continue taking Coumadin 3 mg daily except 4 mg on M-W-F

## 2015-01-21 ENCOUNTER — Encounter: Payer: Self-pay | Admitting: Family Medicine

## 2015-01-21 ENCOUNTER — Ambulatory Visit (INDEPENDENT_AMBULATORY_CARE_PROVIDER_SITE_OTHER): Payer: Commercial Managed Care - HMO | Admitting: Family Medicine

## 2015-01-21 VITALS — BP 132/74 | HR 80 | Temp 97.6°F | Resp 16 | Wt 241.0 lb

## 2015-01-21 DIAGNOSIS — J0110 Acute frontal sinusitis, unspecified: Secondary | ICD-10-CM

## 2015-01-21 MED ORDER — AMOXICILLIN-POT CLAVULANATE 875-125 MG PO TABS: 1.0000 | ORAL_TABLET | Freq: Two times a day (BID) | ORAL | Status: DC

## 2015-01-21 NOTE — Progress Notes (Signed)
Patient ID: Tracy Mann, female   DOB: 1933-08-15, 81 y.o.   MRN: [id number]       Patient: Tracy Mann Female    DOB: December 01, 1933   81 y.o.   MRN: [id number] Visit Date: 01/21/2015  Today's Provider: Wilhemena Durie, MD   Chief Complaint  Patient presents with  . URI    since yesterday. Patient reports that she also has a headache along with nausea.    Subjective:    URI  This is a new problem. The current episode started in the past 7 days. There has been no fever. Associated symptoms include congestion, ear pain, headaches, nausea, a plugged ear sensation and sinus pain. She has tried nothing for the symptoms.  Patient reports that her symptoms came on all of a sudden yesterday morning. She reports that she has a hard time concentrating, and she has a lot of congestion in her head. She does take a daily nasal spray, but reports that this has not helped. She reports that she has a lot of drainage in the back of her throat that it making her her very nauseated.      Allergies  Allergen Reactions  . Clarithromycin     Hives, headaches, hard time swelling, felt like throat was closing up  . Diphenhydramine   . Estrogens Conjugated   . Sulfonamide Derivatives Swelling    'tongue swelling'   Previous Medications   ACCU-CHEK AVIVA PLUS TEST STRIP       AMOXICILLIN-CLAVULANATE (AUGMENTIN) 875-125 MG TABLET    Take 1 tablet by mouth 2 (two) times daily.   COMBIVENT RESPIMAT 20-100 MCG/ACT AERS RESPIMAT    1 puff every 6 (six) hours as needed.    FLUTICASONE (FLONASE) 50 MCG/ACT NASAL SPRAY    2 (TWO) SPRAYS INTRANASALLY SPRAYS INTRANASALLY, NASAL, DAILY   FUROSEMIDE (LASIX) 20 MG TABLET    Take 2 tablets once daily   HYDROCODONE-ACETAMINOPHEN (NORCO) 10-325 MG TABLET    Take 1 tablet by mouth every 6 (six) hours as needed. On 10/02/2014   HYDROCODONE-ACETAMINOPHEN (NORCO) 10-325 MG TABLET    Take 1 tablet by mouth every 6 (six) hours as needed. On 10/02/2014   INSULIN GLARGINE  (LANTUS) 100 UNIT/ML INJECTION    Inject 22 Units into the skin at bedtime.   INSULIN LISPRO (HUMALOG KWIKPEN) 100 UNIT/ML KIWKPEN    INJECT UP TO 10 UNITS SUBCUTANEOUSLY THREE TIMES DAILY BEFORE EACH MEAL(S) AS DIRECTED   INSULIN LISPRO (HUMALOG) 100 UNIT/ML KIWKPEN    Inject up to 10 units three times daily before each meal as directed   LANTUS SOLOSTAR 100 UNIT/ML SOLOSTAR PEN    Reported on 01/21/2015   LEVOTHYROXINE (SYNTHROID, LEVOTHROID) 100 MCG TABLET       LISINOPRIL (PRINIVIL,ZESTRIL) 2.5 MG TABLET    Take 1 tablet (2.5 mg total) by mouth daily.   LORAZEPAM (ATIVAN) 1 MG TABLET    TAKE 1 TABLET TWICE A DAY AS NEEDED FOR ANXIETY   MAGNESIUM SULFATE 50 % INJECTION    Take by mouth. Reported on 01/21/2015   METOPROLOL SUCCINATE (TOPROL-XL) 25 MG 24 HR TABLET    Take 1 tablet (25 mg total) by mouth 2 (two) times daily.   NITROGLYCERIN (NITROSTAT) 0.4 MG SL TABLET    Place 1 tablet (0.4 mg total) under the tongue every 5 (five) minutes as needed. May repeat for up to 3 doses.   PHENAZOPYRIDINE (PYRIDIUM) 100 MG TABLET    Take 100 mg by mouth  as needed. Reported on 01/21/2015   WARFARIN (COUMADIN) 3 MG TABLET    TAKE 1 TABLET (3 MG TOTAL) BY MOUTH DAILY.   WARFARIN (COUMADIN) 4 MG TABLET       ZETIA 10 MG TABLET    Take 1 tablet by mouth Daily.    Review of Systems  Constitutional: Positive for fatigue.  HENT: Positive for congestion, ear pain, postnasal drip and sinus pressure.   Respiratory: Positive for shortness of breath.   Cardiovascular: Negative.   Gastrointestinal: Positive for nausea.  Neurological: Positive for headaches.    Social History  Substance Use Topics  . Smoking status: Never Smoker   . Smokeless tobacco: Never Used  . Alcohol Use: No   Objective:   BP 132/74 mmHg  Pulse 80  Temp(Src) 97.6 F (36.4 C)  Resp 16  Wt 241 lb (109.317 kg)  Physical Exam  Constitutional: She is oriented to person, place, and time. She appears well-developed and  well-nourished.  HENT:  Head: Normocephalic and atraumatic.  Right Ear: External ear normal.  Left Ear: External ear normal.  Nose: Nose normal.  Frontal sinuses tender. TMs full.   Eyes: Conjunctivae are normal.  Nystagmus to the right.   Neck: Normal range of motion. Neck supple.  Cardiovascular: Normal rate, regular rhythm, normal heart sounds and intact distal pulses.   Pulmonary/Chest: Effort normal and breath sounds normal.  Neurological: She is alert and oriented to person, place, and time.  Mild nystagmus to right.  Skin: Skin is warm and dry.  Psychiatric: She has a normal mood and affect. Her behavior is normal. Judgment and thought content normal.  Nursing note and vitals reviewed.       Assessment & Plan:     1. Acute frontal sinusitis, recurrence not specified New problem. Treat with Augmentin 875mg  BID X 7 days. Instructed patient to keep F/U appt in 2 weeks. Patient will call if symptoms does not improve.  2.AFib       Wilhemena Durie, MD  [facility]

## 2015-02-03 ENCOUNTER — Ambulatory Visit: Payer: Commercial Managed Care - HMO

## 2015-02-05 ENCOUNTER — Ambulatory Visit (INDEPENDENT_AMBULATORY_CARE_PROVIDER_SITE_OTHER): Payer: Medicare Other

## 2015-02-05 DIAGNOSIS — I482 Chronic atrial fibrillation, unspecified: Secondary | ICD-10-CM

## 2015-02-05 LAB — POCT INR
INR: 3.5
PT: 41.5

## 2015-02-05 NOTE — Progress Notes (Signed)
Anticoagulation Dose Instructions as of 02/05/2015      Tracy Mann Tue Wed Thu Fri Sat   New Dose 3 mg 4 mg 3 mg 4 mg 3 mg 4 mg 3 mg    Description        Hold for 1 day, then Continue taking Coumadin 3 mg daily except 4 mg on M-W-F.

## 2015-02-05 NOTE — Patient Instructions (Addendum)
Patient is to hold dose for 1 day, then continue current dosage. Recheck in 2 weeks.

## 2015-02-11 ENCOUNTER — Ambulatory Visit (INDEPENDENT_AMBULATORY_CARE_PROVIDER_SITE_OTHER): Payer: Medicare Other | Admitting: Family Medicine

## 2015-02-11 VITALS — BP 124/68 | HR 90 | Temp 97.7°F | Resp 18 | Wt 235.0 lb

## 2015-02-11 DIAGNOSIS — L02419 Cutaneous abscess of limb, unspecified: Secondary | ICD-10-CM

## 2015-02-11 DIAGNOSIS — J321 Chronic frontal sinusitis: Secondary | ICD-10-CM

## 2015-02-11 DIAGNOSIS — E119 Type 2 diabetes mellitus without complications: Secondary | ICD-10-CM | POA: Diagnosis not present

## 2015-02-11 DIAGNOSIS — M159 Polyosteoarthritis, unspecified: Secondary | ICD-10-CM

## 2015-02-11 DIAGNOSIS — I482 Chronic atrial fibrillation, unspecified: Secondary | ICD-10-CM

## 2015-02-11 DIAGNOSIS — L03119 Cellulitis of unspecified part of limb: Secondary | ICD-10-CM | POA: Diagnosis not present

## 2015-02-11 DIAGNOSIS — Z794 Long term (current) use of insulin: Secondary | ICD-10-CM

## 2015-02-11 DIAGNOSIS — M150 Primary generalized (osteo)arthritis: Secondary | ICD-10-CM

## 2015-02-11 MED ORDER — AMOXICILLIN-POT CLAVULANATE 875-125 MG PO TABS: 1.0000 | ORAL_TABLET | Freq: Two times a day (BID) | ORAL | Status: DC

## 2015-02-11 MED ORDER — HYDROCODONE-ACETAMINOPHEN 10-325 MG PO TABS: 1.0000 | ORAL_TABLET | Freq: Four times a day (QID) | ORAL | Status: DC | PRN

## 2015-02-11 NOTE — Progress Notes (Signed)
Patient ID: Tracy Mann, female   DOB: 01-12-1934, 82 y.o.   MRN: [id number]    Subjective:  HPI  Patient is here with sinus issues again. She was seen on December 22nd and was given Augmentin. She finished antibiotic and thought she was feeling better but then again the symptoms came back. Symptoms include: headache-severe especially in the back of her head, head pressure "feels like she has water weight", nasal congestion, ear pressure, blowing out bloody mucus, coughing up greenish/yellowish phlegm. Had a temperature of 98.9 last night. She states Norco has helped headache to be less severe.  Prior to Admission medications   Medication Sig Start Date End Date Taking? Authorizing Provider  ACCU-CHEK AVIVA PLUS test strip  11/23/14  Yes Historical Provider, MD  amoxicillin-clavulanate (AUGMENTIN) 875-125 MG tablet Take 1 tablet by mouth 2 (two) times daily. 12/29/14  Yes Richard Maceo Pro., MD  amoxicillin-clavulanate (AUGMENTIN) 875-125 MG tablet Take 1 tablet by mouth 2 (two) times daily. 01/21/15  Yes Richard Maceo Pro., MD  COMBIVENT RESPIMAT 20-100 MCG/ACT AERS respimat 1 puff every 6 (six) hours as needed.  07/03/14  Yes Historical Provider, MD  fluticasone (FLONASE) 50 MCG/ACT nasal spray 2 (TWO) SPRAYS INTRANASALLY SPRAYS INTRANASALLY, NASAL, DAILY 06/01/14  Yes Historical Provider, MD  furosemide (LASIX) 20 MG tablet Take 2 tablets once daily 09/10/14  Yes Richard Maceo Pro., MD  HYDROcodone-acetaminophen [facility]) 10-325 MG tablet Take 1 tablet by mouth every 6 (six) hours as needed. On 10/02/2014 12/02/14  Yes Richard Maceo Pro., MD  HYDROcodone-acetaminophen [facility]) 10-325 MG tablet Take 1 tablet by mouth every 6 (six) hours as needed. On 10/02/2014 12/02/14  Yes Richard Maceo Pro., MD  insulin glargine (LANTUS) 100 UNIT/ML injection Inject 22 Units into the skin at bedtime.   Yes Historical Provider, MD  insulin lispro (HUMALOG KWIKPEN) 100 UNIT/ML KiwkPen INJECT UP TO 10 UNITS  SUBCUTANEOUSLY THREE TIMES DAILY BEFORE EACH MEAL(S) AS DIRECTED 01/04/15  Yes Historical Provider, MD  insulin lispro (HUMALOG) 100 UNIT/ML KiwkPen Inject up to 10 units three times daily before each meal as directed 10/23/13  Yes Historical Provider, MD  LANTUS SOLOSTAR 100 UNIT/ML Solostar Pen Reported on 01/21/2015 06/16/14  Yes Historical Provider, MD  levothyroxine (SYNTHROID, LEVOTHROID) 100 MCG tablet  10/27/14  Yes Historical Provider, MD  lisinopril (PRINIVIL,ZESTRIL) 2.5 MG tablet Take 1 tablet (2.5 mg total) by mouth daily. 08/11/13  Yes Minna Merritts, MD  LORazepam (ATIVAN) 1 MG tablet TAKE 1 TABLET TWICE A DAY AS NEEDED FOR ANXIETY 12/31/14  Yes Jerrol Banana., MD  magnesium sulfate 50 % injection Take by mouth. Reported on 01/21/2015   Yes Historical Provider, MD  metoprolol succinate (TOPROL-XL) 25 MG 24 hr tablet Take 1 tablet (25 mg total) by mouth 2 (two) times daily. 10/21/13  Yes Minna Merritts, MD  nitroGLYCERIN (NITROSTAT) 0.4 MG SL tablet Place 1 tablet (0.4 mg total) under the tongue every 5 (five) minutes as needed. May repeat for up to 3 doses. 08/05/10  Yes Minna Merritts, MD  phenazopyridine (PYRIDIUM) 100 MG tablet Take 100 mg by mouth as needed. Reported on 01/21/2015   Yes Historical Provider, MD  warfarin (COUMADIN) 3 MG tablet TAKE 1 TABLET (3 MG TOTAL) BY MOUTH DAILY. 08/12/14  Yes Historical Provider, MD  warfarin (COUMADIN) 4 MG tablet  08/25/14  Yes Historical Provider, MD  ZETIA 10 MG tablet Take 1 tablet by mouth Daily. 02/07/11  Yes Historical Provider,  MD    Patient Active Problem List   Diagnosis Date Noted  . Mixed Ischemic/Nonischemic Cardiomyopathy   . Type II diabetes mellitus ([place])   . Chronic atrial fibrillation ([place])   . Chronic combined systolic and diastolic CHF (congestive heart failure) ([place])   . Chronic anticoagulation   . Absolute anemia 07/08/2014  . Airway hyperreactivity 07/08/2014  . Cervical muscle strain 07/08/2014  . PNA  (pneumonia) 07/08/2014  . Chest pressure 07/08/2014  . Clinical depression 07/08/2014  . Degeneration of lumbar or lumbosacral intervertebral disc 07/08/2014  . Accumulation of fluid in tissues 07/08/2014  . Anxiety, generalized 07/08/2014  . Acid reflux 07/08/2014  . Folliculitis 99991111  . Adult hypothyroidism 07/08/2014  . Broken leg 07/08/2014  . Cannot sleep 07/08/2014  . Obstructive sleep apnea 07/08/2014  . Arthritis, degenerative 07/08/2014  . Onychia of finger 07/08/2014  . Psoriasis 07/08/2014  . Restless legs syndrome 07/08/2014  . Adult BMI 30+ 07/08/2014  . Calculus of kidney 10/31/2011  . Bladder infection, chronic 10/31/2011  . Incomplete bladder emptying 10/31/2011  . Obesity 11/02/2010  . SHORTNESS OF BREATH 02/01/2010  . Hyperlipidemia 09/16/2008  . HYPERTENSION, HEART CONTROLLED W/O ASSOC CHF 09/16/2008  . CAD, NATIVE VESSEL 09/16/2008  . Atrial fibrillation ([place]) 09/16/2008    Past Medical History  Diagnosis Date  . Chronic atrial fibrillation (HCC)     a. CHA2DS2VASc = 6--> warfarin.  . Chronic combined systolic and diastolic CHF (congestive heart failure) ([employer])     a. 10/2012 Echo: EF 35-40%, diff HK, mild MR, mild biatrial enlargement;  b. 11/2012 EF 45-50% by LV gram; c. echo 07/2014: EF 40-45%, mod conc LVH, mod MR, severe biatrial enlargement, PASP 45 mm Hg  . Chronic anticoagulation     a. warfarin.  . Acute myocardial infarction of inferior wall ([place])     a. Presumed to be 2/2 to a coronary embolus in setting of persistent Afib-->Cath relatively nl cors, EF 45-50% w/ severe distal inferior/apical inferior HK w/ aneursymal appearance.  . Essential hypertension   . Obesity   . Hyperlipidemia, mixed   . Type II diabetes mellitus ([place])   . Mixed Ischemic/Nonischemic Cardiomyopathy     a. 10/2012 Echo: EF 35-40%;  b. 11/2012 EF 45-50% by LV gram.    Social History   Social History  . Marital Status: Widowed    Spouse Name: widowed  . Number  of Children: 4  . Years of Education: 14   Occupational History  . retired    Social History Main Topics  . Smoking status: Never Smoker   . Smokeless tobacco: Never Used  . Alcohol Use: No  . Drug Use: No  . Sexual Activity: No   Other Topics Concern  . Not on file   Social History Narrative   Widowed   Has children and grandchildren   Lives in [place]  . Clarithromycin     Hives, headaches, hard time swelling, felt like throat was closing up  . Diphenhydramine   . Estrogens Conjugated   . Sulfonamide Derivatives Swelling    'tongue swelling'    Review of Systems  Constitutional: Positive for malaise/fatigue. Negative for fever and chills.  HENT: Positive for congestion and ear pain.   Respiratory: Positive for cough.   Cardiovascular: Negative.   Gastrointestinal: Positive for nausea.  Musculoskeletal: Positive for myalgias, back pain and joint pain.  Neurological: Positive for headaches.     There is no immunization  history on file for this patient. Objective:  BP 124/68 mmHg  Pulse 90  Temp(Src) 97.7 F (36.5 C)  Resp 18  Wt 235 lb (106.595 kg)  SpO2 94%  Physical Exam  Constitutional: She is oriented to person, place, and time and well-developed, well-nourished, and in no distress.  HENT:  Head: Normocephalic and atraumatic.  Nose: Nose normal.  Mouth/Throat: Oropharynx is clear and moist.  Eyes: Conjunctivae are normal. Pupils are equal, round, and reactive to light.  Neck: Normal range of motion. Neck supple.  Cardiovascular: Normal rate, regular rhythm, normal heart sounds and intact distal pulses.   No murmur heard. Pulmonary/Chest: Effort normal and breath sounds normal. No respiratory distress. She has no wheezes.  Musculoskeletal: Normal range of motion. She exhibits no tenderness.  Neurological: She is alert and oriented to person, place, and time.  Psychiatric: Mood, memory, affect and judgment normal.     Lab Results  Component Value Date   WBC 5.0 01/23/2014   HGB 11.2* 01/23/2014   HCT 34.0* 01/23/2014   PLT 232 01/23/2014   GLUCOSE 122* 09/08/2014   TSH 1.340 07/14/2014   INR 3.5 02/05/2015   HGBA1C 6.2 09/08/2008    CMP     Component Value Date/Time   NA 139 09/08/2014 1219   NA 136 01/23/2014 0406   NA 137 11/22/2006 2309   K 4.5 09/08/2014 1219   K 4.4 01/23/2014 0406   CL 97 09/08/2014 1219   CL 100 01/23/2014 0406   CO2 27 09/08/2014 1219   CO2 30 01/23/2014 0406   GLUCOSE 122* 09/08/2014 1219   GLUCOSE 270* 01/23/2014 0406   GLUCOSE 104* 11/22/2006 2309   BUN 26 09/08/2014 1219   BUN 26* 01/23/2014 0406   BUN 17 11/22/2006 2309   CREATININE 0.63 09/08/2014 1219   CREATININE 0.94 01/23/2014 0406   CALCIUM 9.4 09/08/2014 1219   CALCIUM 8.7 01/23/2014 0406   PROT 8.1 05/06/2013 2206   ALBUMIN 4.0 09/08/2014 1219   ALBUMIN 3.5 05/06/2013 2206   AST 25 05/06/2013 2206   ALT 20 05/06/2013 2206   ALKPHOS 99 05/06/2013 2206   BILITOT 0.7 05/06/2013 2206   GFRNONAA 86 09/08/2014 1219   GFRNONAA >60 01/23/2014 0406   GFRNONAA >60 05/07/2013 0535   GFRAA 99 09/08/2014 1219   GFRAA >60 01/23/2014 0406   GFRAA >60 05/07/2013 0535    Assessment and Plan :  1. Chronic frontal sinusitis Recurrent. Will go and treat and will refer to ENT at this time for further work up. - Ambulatory referral to ENT - amoxicillin-clavulanate (AUGMENTIN) 875-125 MG tablet; Take 1 tablet by mouth 2 (two) times daily.  Dispense: 30 tablet; Refill: 0  2. Chronic atrial fibrillation (HCC)  3. Type 2 diabetes mellitus without complication, with long-term current use of insulin (HCC) 4. Obesity 5. Chronic pain/osteoarthritis I have done the exam and reviewed the above chart and it is accurate to the best of my knowledge.      Miguel Aschoff MD [facility] 02/11/2015 3:18 PM

## 2015-02-15 ENCOUNTER — Ambulatory Visit: Payer: Medicare Other | Admitting: Family Medicine

## 2015-02-19 ENCOUNTER — Ambulatory Visit: Payer: Self-pay

## 2015-02-24 ENCOUNTER — Ambulatory Visit (INDEPENDENT_AMBULATORY_CARE_PROVIDER_SITE_OTHER): Payer: Medicare Other

## 2015-02-24 DIAGNOSIS — I482 Chronic atrial fibrillation, unspecified: Secondary | ICD-10-CM

## 2015-02-24 LAB — POCT INR
INR: 2
PT: 23.9

## 2015-02-24 NOTE — Patient Instructions (Signed)
Anticoagulation Dose Instructions as of 02/24/2015      Tracy Mann Tue Wed Thu Fri Sat   New Dose 3 mg 4 mg 3 mg 4 mg 3 mg 4 mg 3 mg    Description        Hold for 1 day, then Continue taking Coumadin 3 mg daily except 4 mg on M-W-F.  F/U 4 weeks

## 2015-03-02 ENCOUNTER — Other Ambulatory Visit: Payer: Self-pay | Admitting: Family Medicine

## 2015-03-03 ENCOUNTER — Ambulatory Visit: Payer: Commercial Managed Care - HMO | Admitting: Family Medicine

## 2015-03-03 ENCOUNTER — Encounter: Payer: Self-pay | Admitting: Cardiovascular Disease

## 2015-03-03 ENCOUNTER — Ambulatory Visit (INDEPENDENT_AMBULATORY_CARE_PROVIDER_SITE_OTHER): Payer: Medicare Other | Admitting: Cardiovascular Disease

## 2015-03-03 ENCOUNTER — Other Ambulatory Visit: Payer: Self-pay | Admitting: Family Medicine

## 2015-03-03 VITALS — BP 122/70 | HR 73 | Ht 65.0 in | Wt 236.0 lb

## 2015-03-03 DIAGNOSIS — R5383 Other fatigue: Secondary | ICD-10-CM | POA: Insufficient documentation

## 2015-03-03 DIAGNOSIS — I428 Other cardiomyopathies: Secondary | ICD-10-CM

## 2015-03-03 DIAGNOSIS — J309 Allergic rhinitis, unspecified: Secondary | ICD-10-CM | POA: Diagnosis not present

## 2015-03-03 DIAGNOSIS — I2510 Atherosclerotic heart disease of native coronary artery without angina pectoris: Secondary | ICD-10-CM

## 2015-03-03 DIAGNOSIS — Z794 Long term (current) use of insulin: Secondary | ICD-10-CM

## 2015-03-03 DIAGNOSIS — I5042 Chronic combined systolic (congestive) and diastolic (congestive) heart failure: Secondary | ICD-10-CM

## 2015-03-03 DIAGNOSIS — I119 Hypertensive heart disease without heart failure: Secondary | ICD-10-CM

## 2015-03-03 DIAGNOSIS — I429 Cardiomyopathy, unspecified: Secondary | ICD-10-CM

## 2015-03-03 DIAGNOSIS — I482 Chronic atrial fibrillation, unspecified: Secondary | ICD-10-CM

## 2015-03-03 DIAGNOSIS — E119 Type 2 diabetes mellitus without complications: Secondary | ICD-10-CM

## 2015-03-03 DIAGNOSIS — J301 Allergic rhinitis due to pollen: Secondary | ICD-10-CM | POA: Diagnosis not present

## 2015-03-03 DIAGNOSIS — R0602 Shortness of breath: Secondary | ICD-10-CM

## 2015-03-03 DIAGNOSIS — R531 Weakness: Secondary | ICD-10-CM

## 2015-03-03 DIAGNOSIS — R51 Headache: Secondary | ICD-10-CM | POA: Diagnosis not present

## 2015-03-03 DIAGNOSIS — R079 Chest pain, unspecified: Secondary | ICD-10-CM

## 2015-03-03 NOTE — Assessment & Plan Note (Signed)
Long discussion concerning her sedentary lifestyle, deconditioning, obesity. Numerous examples provided of ways that she can become more active. Exercises provided that she can do at home.   Total encounter time more than 25 minutes  Greater than 50% was spent in counseling and coordination of care with the patient

## 2015-03-03 NOTE — Assessment & Plan Note (Signed)
Currently with no symptoms of angina. No further workup at this time. Continue current medication regimen. 

## 2015-03-03 NOTE — Progress Notes (Signed)
Patient ID: Tracy Mann, female    DOB: Nov 03, 1933, 82 y.o.   MRN: [id number]  HPI Comments: Ms. Berrier is a 82 year old woman with a PMH significant for chronic atrial fibrillation on Coumadin therapy, diastolic dysfunction, hypertension, hyperlipidemia, morbid obesity, and history of an  inferior wall myocardial infarction that was felt to be secondary to a thrombus from atrial fib. in October 2007 at [facility], previous episodes of chest pain who presents for followup  Of her coronary artery disease and leg edema. Previous echocardiogram with moderate pulmonary hypertension  She reports that she has had worsening leg edema over the past several months. Previously was taking Lasix daily, now taking Lasix 20 g twice a day Mild improvement in her swelling Previously seen by vein and vascular, Dr. Ronalee Belts, recommended to wear TED hose or ACE wraps She is unable to tolerate either secondary to rash or pinching below the knee It was mentioned that perhaps she could wear lymphedema compression pump. She does not have this yet She reports redness, scaling, leg tightness She does sit down during the daytime with her legs down, unable to get her legs up very high Difficulty touching her toes, unable to put compression hose on  Biggest complaint is fatigue, leg weakness, overall deconditioning She does go out to eat frequently though reports that she does not add extra salt  Currently with no physical activity, uses a walker Still struggling with her weight  EKG on today's visit shows atrial fibrillation with ventricular rate 73 bpm, poor R-wave progression to the anterior precordial leads, left anterior fascicular block  Other past medical history reviewed diabetes, currently on insulin. Followed by Dr. Blossom Hoops. Previous echocardiogram showing ejection fraction 35-40% in 2014 Follow-up cardiac catheterization showing ejection fraction 45-50%  Continued chronic knee  problems, difficulty walking long distances  Other past medical history On a previous visit, she had worsening shortness of breath, poor energy.  Echocardiogram was done that showed moderately depressed ejection fraction of 35%. Previously she had been 50%. She felt poorly  with shortness of breath and fatigue with any exertion.   Cardiac catheterization was performed to rule out ischemia as a cause of her depressed ejection fraction. This was done on 12/06/78.  She had a right dominant coronary arterial system with mild luminal irregularities, no significant stenoses that could contribute to her depressed ejection fraction. EF was estimated at 45%. There was an aneurysmal/severely hypokinetic region in the distal inferior and apical inferior wall from old MI 2007.   Previous  echocardiogram several years ago showed ejection fraction 50%. Moderately elevated right ventricular systolic pressures. Previous 48-hour Holter monitor, which showed that she was in persistent atrial fibrillation with one episode of rapid ventricular response.      total cholesterol 198, triglycerides 300, LDL 104      Allergies  Allergen Reactions  . Clarithromycin     Hives, headaches, hard time swelling, felt like throat was closing up  . Diphenhydramine   . Estrogens Conjugated   . Sulfonamide Derivatives Swelling    'tongue swelling'    Outpatient Encounter Prescriptions as of 03/03/2015  Medication Sig  . ACCU-CHEK AVIVA PLUS test strip   . COMBIVENT RESPIMAT 20-100 MCG/ACT AERS respimat 1 puff every 6 (six) hours as needed.   . fluticasone (FLONASE) 50 MCG/ACT nasal spray 2 (TWO) SPRAYS INTRANASALLY SPRAYS INTRANASALLY, NASAL, DAILY  . furosemide (LASIX) 20 MG tablet Take 2 tablets once daily  . HYDROcodone-acetaminophen (NORCO) 10-325 MG tablet  Take 1 tablet by mouth every 6 (six) hours as needed. On 10/02/2014  . insulin glargine (LANTUS) 100 UNIT/ML injection Inject 22 Units into the skin at bedtime.   . insulin lispro (HUMALOG KWIKPEN) 100 UNIT/ML KiwkPen INJECT UP TO 10 UNITS SUBCUTANEOUSLY THREE TIMES DAILY BEFORE EACH MEAL(S) AS DIRECTED  . insulin lispro (HUMALOG) 100 UNIT/ML KiwkPen Inject up to 10 units three times daily before each meal as directed  . levothyroxine (SYNTHROID, LEVOTHROID) 100 MCG tablet Take 100 mcg by mouth daily before breakfast.   . lisinopril (PRINIVIL,ZESTRIL) 2.5 MG tablet Take 1 tablet (2.5 mg total) by mouth daily.  Marland Kitchen LORazepam (ATIVAN) 1 MG tablet TAKE 1 TABLET TWICE A DAY AS NEEDED FOR ANXIETY  . magnesium sulfate 50 % injection Take by mouth. Reported on 01/21/2015  . metoprolol succinate (TOPROL-XL) 25 MG 24 hr tablet Take 1 tablet (25 mg total) by mouth 2 (two) times daily.  . nitroGLYCERIN (NITROSTAT) 0.4 MG SL tablet Place 1 tablet (0.4 mg total) under the tongue every 5 (five) minutes as needed. May repeat for up to 3 doses.  . phenazopyridine (PYRIDIUM) 100 MG tablet Take 100 mg by mouth as needed. Reported on 01/21/2015  . rOPINIRole (REQUIP) 0.25 MG tablet TAKE 1 TABLET BY MOUTH AT BEDTIME  . warfarin (COUMADIN) 3 MG tablet TAKE 1 TABLET (3 MG TOTAL) BY MOUTH DAILY.  Marland Kitchen warfarin (COUMADIN) 4 MG tablet   . ZETIA 10 MG tablet Take 1 tablet by mouth Daily.  . [DISCONTINUED] amoxicillin-clavulanate (AUGMENTIN) 875-125 MG tablet Take 1 tablet by mouth 2 (two) times daily. (Patient not taking: Reported on 03/03/2015)  . [DISCONTINUED] amoxicillin-clavulanate (AUGMENTIN) 875-125 MG tablet Take 1 tablet by mouth 2 (two) times daily. (Patient not taking: Reported on 03/03/2015)  . [DISCONTINUED] HYDROcodone-acetaminophen (NORCO) 10-325 MG tablet Take 1 tablet by mouth every 6 (six) hours as needed. On 10/02/2014 (Patient not taking: Reported on 03/03/2015)  . [DISCONTINUED] LANTUS SOLOSTAR 100 UNIT/ML Solostar Pen Reported on 03/03/2015   No facility-administered encounter medications on file as of 03/03/2015.    Past Medical History  Diagnosis Date  . Chronic atrial  fibrillation (HCC)     a. CHA2DS2VASc = 6--> warfarin.  . Chronic combined systolic and diastolic CHF (congestive heart failure) ([place])     a. 10/2012 Echo: EF 35-40%, diff HK, mild MR, mild biatrial enlargement;  b. 11/2012 EF 45-50% by LV gram; c. echo 07/2014: EF 40-45%, mod conc LVH, mod MR, severe biatrial enlargement, PASP 45 mm Hg  . Chronic anticoagulation     a. warfarin.  . Acute myocardial infarction of inferior wall ([place])     a. Presumed to be 2/2 to a coronary embolus in setting of persistent Afib-->Cath relatively nl cors, EF 45-50% w/ severe distal inferior/apical inferior HK w/ aneursymal appearance.  . Essential hypertension   . Obesity   . Hyperlipidemia, mixed   . Type II diabetes mellitus ([place])   . Mixed Ischemic/Nonischemic Cardiomyopathy     a. 10/2012 Echo: EF 35-40%;  b. 11/2012 EF 45-50% by LV gram.    Past Surgical History  Procedure Laterality Date  . Vesicovaginal fistula closure w/ tah  1996  . Tubal ligation  1968  . Cholecystectomy  1999  . Cardiac catheterization  11/2012    [facility];EF 45-50%    Social History  reports that she has never smoked. She has never used smokeless tobacco. She reports that she does not drink alcohol or use illicit drugs.  Family History family  history includes Heart disease in her mother; Thyroid disease in her father.  Review of Systems  Constitutional: Positive for fatigue.  Respiratory: Negative.   Cardiovascular: Positive for leg swelling.  Gastrointestinal: Negative.   Musculoskeletal: Positive for joint swelling, arthralgias and gait problem.  Skin: Negative.   Neurological: Positive for weakness.  Hematological: Negative.   Psychiatric/Behavioral: Negative.   All other systems reviewed and are negative.  BP 122/70 mmHg  Pulse 73  Ht 5\' 5"  (1.651 m)  Wt 236 lb (107.049 kg)  BMI 39.27 kg/m2  Physical Exam  Constitutional: She is oriented to person, place, and time. She appears well-developed and  well-nourished.  Obese.  HENT:  Head: Normocephalic.  Nose: Nose normal.  Mouth/Throat: Oropharynx is clear and moist.  Eyes: Conjunctivae are normal. Pupils are equal, round, and reactive to light.  Neck: Normal range of motion. Neck supple. No JVD present.  Cardiovascular: Normal rate, S1 normal, S2 normal and intact distal pulses.  An irregularly irregular rhythm present. Exam reveals no gallop and no friction rub.   Murmur heard.  Crescendo systolic murmur is present with a grade of 2/6  Edema is nonpitting. She has varicies.  Pulmonary/Chest: Effort normal and breath sounds normal. No respiratory distress. She has no wheezes. She has no rales. She exhibits no tenderness.  Abdominal: Soft. Bowel sounds are normal. She exhibits no distension. There is no tenderness.  Musculoskeletal: Normal range of motion. She exhibits no edema or tenderness.  Lymphadenopathy:    She has no cervical adenopathy.  Neurological: She is alert and oriented to person, place, and time. Coordination normal.  Skin: Skin is warm and dry. No rash noted. No erythema.  Psychiatric: She has a normal mood and affect. Her behavior is normal. Judgment and thought content normal.    Assessment and Plan  Nursing note and vitals reviewed.

## 2015-03-03 NOTE — Assessment & Plan Note (Signed)
Heart rate is well controlled on today's visit. No changes to her medications. Tolerating anticoagulation

## 2015-03-03 NOTE — Assessment & Plan Note (Signed)
Blood pressure is well controlled on today's visit. No changes made to the medications. 

## 2015-03-03 NOTE — Assessment & Plan Note (Signed)
We have encouraged continued exercise, careful diet management in an effort to lose weight. 

## 2015-03-03 NOTE — Assessment & Plan Note (Signed)
Fluid status is difficult,, located by chronic venous insufficiency, possibly component of lymphedema. She does not want lab draw today to check her renal function Suggested she stay on Lasix 20 mg twice a day, repeat lab work with Dr. Rosanna Randy She has follow-up with vein and vascular, suggested she discuss lymphedema compression pumps Also recommended leg elevation

## 2015-03-03 NOTE — Patient Instructions (Signed)
You are doing well. No medication changes were made.  Ask Dr. Ronalee Belts about lymphedema compression pump  Please start your exercises, stretching  Please call us if you have new issues that need to be addressed before your next appt.  Your physician wants you to follow-up in: 6 months.  You will receive a reminder letter in the mail two months in advance. If you don't receive a letter, please call our office to schedule the follow-up appointment.

## 2015-03-03 NOTE — Assessment & Plan Note (Signed)
Recommended Lasix 20 mg twice a day for now, repeat lab work with Dr. Rosanna Randy If renal function is normal, could increase Lasix up to 40 mg twice a day

## 2015-03-11 ENCOUNTER — Ambulatory Visit: Payer: Commercial Managed Care - HMO | Admitting: Family Medicine

## 2015-03-12 ENCOUNTER — Telehealth: Payer: Self-pay | Admitting: Family Medicine

## 2015-03-15 DIAGNOSIS — I8310 Varicose veins of unspecified lower extremity with inflammation: Secondary | ICD-10-CM | POA: Diagnosis not present

## 2015-03-15 DIAGNOSIS — M7989 Other specified soft tissue disorders: Secondary | ICD-10-CM | POA: Diagnosis not present

## 2015-03-18 ENCOUNTER — Other Ambulatory Visit: Payer: Self-pay | Admitting: Family Medicine

## 2015-03-24 ENCOUNTER — Ambulatory Visit: Payer: Medicare Other

## 2015-03-31 ENCOUNTER — Other Ambulatory Visit: Payer: Self-pay | Admitting: Family Medicine

## 2015-03-31 ENCOUNTER — Ambulatory Visit (INDEPENDENT_AMBULATORY_CARE_PROVIDER_SITE_OTHER): Payer: Medicare Other | Admitting: Family Medicine

## 2015-03-31 VITALS — BP 126/60 | HR 74 | Resp 16 | Wt 237.0 lb

## 2015-03-31 DIAGNOSIS — M150 Primary generalized (osteo)arthritis: Secondary | ICD-10-CM | POA: Diagnosis not present

## 2015-03-31 DIAGNOSIS — E669 Obesity, unspecified: Secondary | ICD-10-CM

## 2015-03-31 DIAGNOSIS — E119 Type 2 diabetes mellitus without complications: Secondary | ICD-10-CM

## 2015-03-31 DIAGNOSIS — I10 Essential (primary) hypertension: Secondary | ICD-10-CM | POA: Diagnosis not present

## 2015-03-31 DIAGNOSIS — L03119 Cellulitis of unspecified part of limb: Secondary | ICD-10-CM | POA: Diagnosis not present

## 2015-03-31 DIAGNOSIS — L02419 Cutaneous abscess of limb, unspecified: Secondary | ICD-10-CM | POA: Diagnosis not present

## 2015-03-31 DIAGNOSIS — I482 Chronic atrial fibrillation, unspecified: Secondary | ICD-10-CM

## 2015-03-31 DIAGNOSIS — Z794 Long term (current) use of insulin: Secondary | ICD-10-CM | POA: Diagnosis not present

## 2015-03-31 DIAGNOSIS — M159 Polyosteoarthritis, unspecified: Secondary | ICD-10-CM

## 2015-03-31 LAB — POCT INR
INR: 2.4
PT: 28.7

## 2015-03-31 MED ORDER — HYDROCODONE-ACETAMINOPHEN 10-325 MG PO TABS: 1.0000 | ORAL_TABLET | Freq: Four times a day (QID) | ORAL | Status: DC | PRN

## 2015-03-31 NOTE — Progress Notes (Signed)
Patient ID: Tracy Mann, female   DOB: [birth year], 82 y.o.   MRN: [id number]    Subjective:  HPI  Patient is here for follow up.  Chronic pain: Patient needs her routine refill.  Coumadin management: patient takes 3 mg daily except 4 mg M,W and Sunday. Last INR was 2.0 on January 02/24/15.  Prior to Admission medications   Medication Sig Start Date End Date Taking? Authorizing Provider  ACCU-CHEK AVIVA PLUS test strip  11/23/14  Yes Historical Provider, MD  COMBIVENT RESPIMAT 20-100 MCG/ACT AERS respimat 1 puff every 6 (six) hours as needed.  07/03/14  Yes Historical Provider, MD  fluticasone (FLONASE) 50 MCG/ACT nasal spray 2 (TWO) SPRAYS INTRANASALLY SPRAYS INTRANASALLY, NASAL, DAILY 06/01/14  Yes Historical Provider, MD  furosemide (LASIX) 20 MG tablet Take 2 tablets once daily 09/10/14  Yes Richard Maceo Pro., MD  HYDROcodone-acetaminophen [facility]) 10-325 MG tablet Take 1 tablet by mouth every 6 (six) hours as needed. On 10/02/2014 02/11/15  Yes Richard Maceo Pro., MD  insulin glargine (LANTUS) 100 UNIT/ML injection Inject 22 Units into the skin at bedtime.   Yes Historical Provider, MD  insulin lispro (HUMALOG KWIKPEN) 100 UNIT/ML KiwkPen INJECT UP TO 10 UNITS SUBCUTANEOUSLY THREE TIMES DAILY BEFORE EACH MEAL(S) AS DIRECTED 01/04/15  Yes Historical Provider, MD  insulin lispro (HUMALOG) 100 UNIT/ML KiwkPen Inject up to 10 units three times daily before each meal as directed 10/23/13  Yes Historical Provider, MD  levothyroxine (SYNTHROID, LEVOTHROID) 100 MCG tablet Take 100 mcg by mouth daily before breakfast.  10/27/14  Yes Historical Provider, MD  lisinopril (PRINIVIL,ZESTRIL) 2.5 MG tablet TAKE 1 TABLET BY MOUTH AT BEDTIME 03/18/15  Yes Richard Maceo Pro., MD  LORazepam (ATIVAN) 1 MG tablet TAKE 1 TABLET PP TWICE A DAY FOR ANXIETY 03/03/15  Yes Jerrol Banana., MD  magnesium sulfate 50 % injection Take by mouth. Reported on 01/21/2015   Yes Historical Provider, MD  metoprolol  succinate (TOPROL-XL) 25 MG 24 hr tablet Take 1 tablet (25 mg total) by mouth 2 (two) times daily. 10/21/13  Yes Minna Merritts, MD  nitroGLYCERIN (NITROSTAT) 0.4 MG SL tablet Place 1 tablet (0.4 mg total) under the tongue every 5 (five) minutes as needed. May repeat for up to 3 doses. 08/05/10  Yes Minna Merritts, MD  phenazopyridine (PYRIDIUM) 100 MG tablet Take 100 mg by mouth as needed. Reported on 01/21/2015   Yes Historical Provider, MD  rOPINIRole (REQUIP) 0.25 MG tablet TAKE 1 TABLET BY MOUTH AT BEDTIME 03/31/15  Yes Richard Maceo Pro., MD  warfarin (COUMADIN) 3 MG tablet TAKE 1 TABLET (3 MG TOTAL) BY MOUTH DAILY. 08/12/14  Yes Historical Provider, MD  warfarin (COUMADIN) 4 MG tablet  08/25/14  Yes Historical Provider, MD  ZETIA 10 MG tablet Take 1 tablet by mouth Daily. 02/07/11  Yes Historical Provider, MD    Patient Active Problem List   Diagnosis Date Noted  . Weakness 03/03/2015  . Mixed Ischemic/Nonischemic Cardiomyopathy   . Type II diabetes mellitus ([place])   . Chronic atrial fibrillation ([place])   . Chronic combined systolic and diastolic CHF (congestive heart failure) ([place])   . Chronic anticoagulation   . Absolute anemia 07/08/2014  . Airway hyperreactivity 07/08/2014  . Cervical muscle strain 07/08/2014  . PNA (pneumonia) 07/08/2014  . Chest pressure 07/08/2014  . Clinical depression 07/08/2014  . Degeneration of lumbar or lumbosacral intervertebral disc 07/08/2014  . Accumulation of fluid in tissues 07/08/2014  .  Anxiety, generalized 07/08/2014  . Acid reflux 07/08/2014  . Folliculitis [id number]  . Adult hypothyroidism 07/08/2014  . Broken leg 07/08/2014  . Cannot sleep 07/08/2014  . Obstructive sleep apnea 07/08/2014  . Arthritis, degenerative 07/08/2014  . Onychia of finger 07/08/2014  . Psoriasis 07/08/2014  . Restless legs syndrome 07/08/2014  . Adult BMI 30+ 07/08/2014  . Calculus of kidney 10/31/2011  . Bladder infection, chronic 10/31/2011  . Incomplete  bladder emptying 10/31/2011  . Obesity 11/02/2010  . SHORTNESS OF BREATH 02/01/2010  . Hyperlipidemia 09/16/2008  . HYPERTENSION, HEART CONTROLLED W/O ASSOC CHF 09/16/2008  . CAD, NATIVE VESSEL 09/16/2008  . Atrial fibrillation ([place]) 09/16/2008    Past Medical History  Diagnosis Date  . Chronic atrial fibrillation (HCC)     a. CHA2DS2VASc = 6--> warfarin.  . Chronic combined systolic and diastolic CHF (congestive heart failure) ([place])     a. 10/2012 Echo: EF 35-40%, diff HK, mild MR, mild biatrial enlargement;  b. 11/2012 EF 45-50% by LV gram; c. echo 07/2014: EF 40-45%, mod conc LVH, mod MR, severe biatrial enlargement, PASP 45 mm Hg  . Chronic anticoagulation     a. warfarin.  . Acute myocardial infarction of inferior wall ([place])     a. Presumed to be 2/2 to a coronary embolus in setting of persistent Afib-->Cath relatively nl cors, EF 45-50% w/ severe distal inferior/apical inferior HK w/ aneursymal appearance.  . Essential hypertension   . Obesity   . Hyperlipidemia, mixed   . Type II diabetes mellitus ([place])   . Mixed Ischemic/Nonischemic Cardiomyopathy     a. 10/2012 Echo: EF 35-40%;  b. 11/2012 EF 45-50% by LV gram.    Social History   Social History  . Marital Status: Widowed    Spouse Name: widowed  . Number of Children: 4  . Years of Education: 14   Occupational History  . retired    Social History Main Topics  . Smoking status: Never Smoker   . Smokeless tobacco: Never Used  . Alcohol Use: No  . Drug Use: No  . Sexual Activity: No   Other Topics Concern  . Not on file   Social History Narrative   Widowed   Has children and grandchildren   Lives in [place]  . Clarithromycin     Hives, headaches, hard time swelling, felt like throat was closing up  . Diphenhydramine   . Estrogens Conjugated   . Sulfonamide Derivatives Swelling    'tongue swelling'    Review of Systems  Constitutional: Negative.   HENT:  Negative.   Eyes: Negative.   Respiratory: Negative.   Cardiovascular: Negative.   Gastrointestinal: Negative.   Genitourinary: Negative.   Musculoskeletal: Positive for myalgias, back pain and joint pain.  Skin: Negative.   Neurological: Negative.   Endo/Heme/Allergies: Negative.   Psychiatric/Behavioral: Negative.      There is no immunization history on file for this patient. Objective:  BP 126/60 mmHg  Pulse 74  Resp 16  Wt 237 lb (107.502 kg)  Physical Exam  Constitutional: She is oriented to person, place, and time and well-developed, well-nourished, and in no distress.  HENT:  Head: Normocephalic and atraumatic.  Eyes: Conjunctivae are normal. Pupils are equal, round, and reactive to light.  Neck: Normal range of motion. Neck supple.  Cardiovascular: Normal rate, regular rhythm, normal heart sounds and intact distal pulses.   No murmur heard. Pulmonary/Chest: Effort normal and breath sounds normal. No respiratory  distress. She has no wheezes.  Abdominal: Soft.  Musculoskeletal: Normal range of motion. She exhibits edema. She exhibits no tenderness.  Neurological: She is alert and oriented to person, place, and time.  Skin: Skin is warm and dry. No rash noted. There is erythema. No pallor.  Psychiatric: Mood, memory, affect and judgment normal.    Lab Results  Component Value Date   WBC 5.0 01/23/2014   HGB 11.2* 01/23/2014   HCT 34.0* 01/23/2014   PLT 232 01/23/2014   GLUCOSE 122* 09/08/2014   TSH 1.340 07/14/2014   INR 2.0 02/24/2015   HGBA1C 6.2 09/08/2008    CMP     Component Value Date/Time   NA 139 09/08/2014 1219   NA 136 01/23/2014 0406   NA 137 11/22/2006 2309   K 4.5 09/08/2014 1219   K 4.4 01/23/2014 0406   CL 97 09/08/2014 1219   CL 100 01/23/2014 0406   CO2 27 09/08/2014 1219   CO2 30 01/23/2014 0406   GLUCOSE 122* 09/08/2014 1219   GLUCOSE 270* 01/23/2014 0406   GLUCOSE 104* 11/22/2006 2309   BUN 26 09/08/2014 1219   BUN 26*  01/23/2014 0406   BUN 17 11/22/2006 2309   CREATININE 0.63 09/08/2014 1219   CREATININE 0.94 01/23/2014 0406   CALCIUM 9.4 09/08/2014 1219   CALCIUM 8.7 01/23/2014 0406   PROT 8.1 05/06/2013 2206   ALBUMIN 4.0 09/08/2014 1219   ALBUMIN 3.5 05/06/2013 2206   AST 25 05/06/2013 2206   ALT 20 05/06/2013 2206   ALKPHOS 99 05/06/2013 2206   BILITOT 0.7 05/06/2013 2206   GFRNONAA 86 09/08/2014 1219   GFRNONAA >60 01/23/2014 0406   GFRNONAA >60 05/07/2013 0535   GFRAA 99 09/08/2014 1219   GFRAA >60 01/23/2014 0406   GFRAA >60 05/07/2013 0535    Assessment and Plan :  1. Primary osteoarthritis involving multiple joints Refill given x 2. Stable. - HYDROcodone-acetaminophen (NORCO) 10-325 MG tablet; Take 1 tablet by mouth every 6 (six) hours as needed.  Dispense: 120 tablet; Refill: 0  2. Chronic atrial fibrillation (HCC) INR 2.4 today. Keep same dose-3 mg daily except 4 mg M, W and Sunday. - POCT INR  3. Cellulitis and abscess of leg Resolved. Advised patient to elevate her legs when she can. Follow as needed for this.  4. Essential hypertension Stable.  5. Obesity Discussed eating smaller portions x 5 times a day, small enough portions that fit in her hand size.  6. Type 2 diabetes mellitus without complication, with long-term current use of insulin [facility]) Follows endocrinologist. 7. Morbid obesity Dietary changes stressed. I have done the exam and reviewed the above chart and it is accurate to the best of my knowledge.  Patient was seen and examined by Dr. Eulas Post and note was scribed by Theressa Millard, RMA.    Miguel Aschoff MD [facility] 03/31/2015 1:41 PM

## 2015-04-26 ENCOUNTER — Other Ambulatory Visit: Payer: Self-pay | Admitting: Family Medicine

## 2015-04-26 DIAGNOSIS — N3020 Other chronic cystitis without hematuria: Secondary | ICD-10-CM

## 2015-04-26 MED ORDER — CIPROFLOXACIN HCL 250 MG PO TABS: 250.0000 mg | ORAL_TABLET | Freq: Two times a day (BID) | ORAL | Status: DC

## 2015-04-26 NOTE — Telephone Encounter (Signed)
Pt contacted office for refill request on the following medications:  Cipro 250mg .  [facility].  CB#[phone]/MW

## 2015-04-26 NOTE — Telephone Encounter (Signed)
Patient reports that she has a recurrent UTI. She reports that Dr. Jacqlyn Larsen is no longer in town and you said that you were willing to refill Cipro if she needed it. Patient reports that she discussed this with you before in the past. Pharmacy listed below is correct. Thanks!

## 2015-04-26 NOTE — Telephone Encounter (Signed)
Sent medication into pharmacy as below. Patient advised.

## 2015-04-26 NOTE — Telephone Encounter (Signed)
Ok for 6 months 

## 2015-04-28 ENCOUNTER — Ambulatory Visit (INDEPENDENT_AMBULATORY_CARE_PROVIDER_SITE_OTHER): Payer: Medicare Other

## 2015-04-28 DIAGNOSIS — I482 Chronic atrial fibrillation, unspecified: Secondary | ICD-10-CM

## 2015-04-28 LAB — POCT INR
INR: 2.2
PT: 26.1

## 2015-04-28 NOTE — Patient Instructions (Signed)
Anticoagulation Dose Instructions as of 04/28/2015      Tracy Mann Tue Wed Thu Fri Sat   New Dose 3 mg 4 mg 3 mg 4 mg 3 mg 4 mg 3 mg    Description        3 mg daily except 4 mg on M-W-F.  F/U 4 weeks

## 2015-05-06 ENCOUNTER — Other Ambulatory Visit: Payer: Self-pay | Admitting: Family Medicine

## 2015-05-13 ENCOUNTER — Other Ambulatory Visit: Payer: Self-pay | Admitting: Family Medicine

## 2015-05-20 ENCOUNTER — Emergency Department: Payer: Medicare Other

## 2015-05-20 ENCOUNTER — Observation Stay
Admission: EM | Admit: 2015-05-20 | Discharge: 2015-05-21 | Disposition: A | Discharge status: Home / Self Care | Payer: Medicare Other | Attending: Internal Medicine | Admitting: Internal Medicine

## 2015-05-20 ENCOUNTER — Encounter: Payer: Self-pay | Admitting: Medical Oncology

## 2015-05-20 ENCOUNTER — Telehealth: Payer: Self-pay | Admitting: Cardiovascular Disease

## 2015-05-20 DIAGNOSIS — R61 Generalized hyperhidrosis: Secondary | ICD-10-CM | POA: Insufficient documentation

## 2015-05-20 DIAGNOSIS — Z6837 Body mass index (BMI) 37.0-37.9, adult: Secondary | ICD-10-CM | POA: Insufficient documentation

## 2015-05-20 DIAGNOSIS — Z7901 Long term (current) use of anticoagulants: Secondary | ICD-10-CM | POA: Diagnosis not present

## 2015-05-20 DIAGNOSIS — Z882 Allergy status to sulfonamides status: Secondary | ICD-10-CM | POA: Diagnosis not present

## 2015-05-20 DIAGNOSIS — E119 Type 2 diabetes mellitus without complications: Secondary | ICD-10-CM | POA: Insufficient documentation

## 2015-05-20 DIAGNOSIS — Z888 Allergy status to other drugs, medicaments and biological substances status: Secondary | ICD-10-CM | POA: Insufficient documentation

## 2015-05-20 DIAGNOSIS — I110 Hypertensive heart disease with heart failure: Secondary | ICD-10-CM | POA: Insufficient documentation

## 2015-05-20 DIAGNOSIS — I429 Cardiomyopathy, unspecified: Secondary | ICD-10-CM | POA: Diagnosis not present

## 2015-05-20 DIAGNOSIS — Z9851 Tubal ligation status: Secondary | ICD-10-CM | POA: Diagnosis not present

## 2015-05-20 DIAGNOSIS — E782 Mixed hyperlipidemia: Secondary | ICD-10-CM | POA: Diagnosis not present

## 2015-05-20 DIAGNOSIS — I272 Other secondary pulmonary hypertension: Secondary | ICD-10-CM | POA: Insufficient documentation

## 2015-05-20 DIAGNOSIS — Z9049 Acquired absence of other specified parts of digestive tract: Secondary | ICD-10-CM | POA: Insufficient documentation

## 2015-05-20 DIAGNOSIS — Z9889 Other specified postprocedural states: Secondary | ICD-10-CM | POA: Insufficient documentation

## 2015-05-20 DIAGNOSIS — E785 Hyperlipidemia, unspecified: Secondary | ICD-10-CM | POA: Diagnosis not present

## 2015-05-20 DIAGNOSIS — I447 Left bundle-branch block, unspecified: Secondary | ICD-10-CM | POA: Diagnosis not present

## 2015-05-20 DIAGNOSIS — Z8349 Family history of other endocrine, nutritional and metabolic diseases: Secondary | ICD-10-CM | POA: Diagnosis not present

## 2015-05-20 DIAGNOSIS — F419 Anxiety disorder, unspecified: Secondary | ICD-10-CM | POA: Insufficient documentation

## 2015-05-20 DIAGNOSIS — R079 Chest pain, unspecified: Secondary | ICD-10-CM | POA: Diagnosis not present

## 2015-05-20 DIAGNOSIS — R918 Other nonspecific abnormal finding of lung field: Secondary | ICD-10-CM | POA: Diagnosis not present

## 2015-05-20 DIAGNOSIS — I482 Chronic atrial fibrillation: Secondary | ICD-10-CM | POA: Insufficient documentation

## 2015-05-20 DIAGNOSIS — I208 Other forms of angina pectoris: Secondary | ICD-10-CM

## 2015-05-20 DIAGNOSIS — I517 Cardiomegaly: Secondary | ICD-10-CM | POA: Diagnosis not present

## 2015-05-20 DIAGNOSIS — I25119 Atherosclerotic heart disease of native coronary artery with unspecified angina pectoris: Secondary | ICD-10-CM | POA: Diagnosis not present

## 2015-05-20 DIAGNOSIS — E039 Hypothyroidism, unspecified: Secondary | ICD-10-CM | POA: Diagnosis not present

## 2015-05-20 DIAGNOSIS — Z794 Long term (current) use of insulin: Secondary | ICD-10-CM | POA: Insufficient documentation

## 2015-05-20 DIAGNOSIS — I5042 Chronic combined systolic (congestive) and diastolic (congestive) heart failure: Secondary | ICD-10-CM | POA: Insufficient documentation

## 2015-05-20 DIAGNOSIS — I10 Essential (primary) hypertension: Secondary | ICD-10-CM | POA: Diagnosis not present

## 2015-05-20 DIAGNOSIS — Z8249 Family history of ischemic heart disease and other diseases of the circulatory system: Secondary | ICD-10-CM | POA: Diagnosis not present

## 2015-05-20 DIAGNOSIS — I252 Old myocardial infarction: Secondary | ICD-10-CM | POA: Diagnosis not present

## 2015-05-20 DIAGNOSIS — R0602 Shortness of breath: Secondary | ICD-10-CM | POA: Insufficient documentation

## 2015-05-20 DIAGNOSIS — Z881 Allergy status to other antibiotic agents status: Secondary | ICD-10-CM | POA: Insufficient documentation

## 2015-05-20 LAB — BASIC METABOLIC PANEL
Anion gap: 6 (ref 5–15)
BUN: 14 mg/dL (ref 6–20)
CALCIUM: 8.9 mg/dL (ref 8.9–10.3)
CO2: 27 mmol/L (ref 22–32)
Chloride: 101 mmol/L (ref 101–111)
Creatinine, Ser: 0.74 mg/dL (ref 0.44–1.00)
GFR calc Af Amer: >60 mL/min (ref >60)
GLUCOSE: 239 mg/dL — AB (ref 65–99)
POTASSIUM: 4.3 mmol/L (ref 3.5–5.1)
Sodium: 134 mmol/L — ABNORMAL LOW (ref 135–145)

## 2015-05-20 LAB — CREATININE, SERUM
CREATININE: 0.83 mg/dL (ref 0.44–1.00)
GFR calc Af Amer: >60 mL/min (ref >60)
GFR calc non Af Amer: >60 mL/min (ref >60)

## 2015-05-20 LAB — CBC
HCT: 38.8 % (ref 35.0–47.0)
HEMATOCRIT: 39.2 % (ref 35.0–47.0)
HEMOGLOBIN: 12.9 g/dL (ref 12.0–16.0)
Hemoglobin: 13.2 g/dL (ref 12.0–16.0)
MCH: 29.9 pg (ref 26.0–34.0)
MCH: 29.7 pg (ref 26.0–34.0)
MCHC: 33.8 g/dL (ref 32.0–36.0)
MCHC: 33.2 g/dL (ref 32.0–36.0)
MCV: 88.5 fL (ref 80.0–100.0)
MCV: 89.6 fL (ref 80.0–100.0)
Platelets: 212 10*3/uL (ref 150–440)
Platelets: 210 10*3/uL (ref 150–440)
RBC: 4.43 MIL/uL (ref 3.80–5.20)
RBC: 4.34 MIL/uL (ref 3.80–5.20)
RDW: 13.1 % (ref 11.5–14.5)
RDW: 13.3 % (ref 11.5–14.5)
WBC: 8.7 10*3/uL (ref 3.6–11.0)
WBC: 8.3 10*3/uL (ref 3.6–11.0)

## 2015-05-20 LAB — GLUCOSE, CAPILLARY: GLUCOSE-CAPILLARY: 146 mg/dL — AB (ref 65–99)

## 2015-05-20 LAB — TROPONIN I
Troponin I: <0.03 ng/mL (ref <0.031)
Troponin I: <0.03 ng/mL (ref <0.031)
Troponin I: <0.03 ng/mL (ref <0.031)

## 2015-05-20 LAB — PROTIME-INR
INR: 3.17
PROTHROMBIN TIME: 31.9 s — AB (ref 11.4–15.0)

## 2015-05-20 MED ORDER — IPRATROPIUM-ALBUTEROL 0.5-2.5 (3) MG/3ML IN SOLN: 3.0000 mL | Freq: Four times a day (QID) | RESPIRATORY_TRACT | Status: DC | PRN

## 2015-05-20 MED ORDER — WARFARIN SODIUM 1 MG PO TABS
1.5000 mg | ORAL_TABLET | Freq: Once | ORAL | Status: AC
  Administered 2015-05-20: 1.5 mg via ORAL
  Filled 2015-05-20: qty 2
  Filled 2015-05-20: qty 1

## 2015-05-20 MED ORDER — METOPROLOL SUCCINATE ER 25 MG PO TB24
25.0000 mg | ORAL_TABLET | Freq: Two times a day (BID) | ORAL | Status: DC
  Administered 2015-05-20: 25 mg via ORAL
  Filled 2015-05-20: qty 1

## 2015-05-20 MED ORDER — LEVOTHYROXINE SODIUM 100 MCG PO TABS
100.0000 ug | ORAL_TABLET | Freq: Every day | ORAL | Status: DC
  Administered 2015-05-21: 100 ug via ORAL
  Filled 2015-05-20: qty 1

## 2015-05-20 MED ORDER — FUROSEMIDE 40 MG PO TABS: 40.0000 mg | ORAL_TABLET | Freq: Every day | ORAL | Status: DC

## 2015-05-20 MED ORDER — HYDROCODONE-ACETAMINOPHEN 10-325 MG PO TABS
1.0000 | ORAL_TABLET | Freq: Four times a day (QID) | ORAL | Status: DC | PRN
  Administered 2015-05-20 – 2015-05-21 (×4): 1 via ORAL
  Filled 2015-05-20 (×4): qty 1

## 2015-05-20 MED ORDER — EZETIMIBE 10 MG PO TABS
10.0000 mg | ORAL_TABLET | Freq: Every day | ORAL | Status: DC
  Administered 2015-05-20: 10 mg via ORAL
  Filled 2015-05-20: qty 1

## 2015-05-20 MED ORDER — ROPINIROLE HCL 1 MG PO TABS
1.0000 mg | ORAL_TABLET | Freq: Every day | ORAL | Status: DC
  Administered 2015-05-20: 1 mg via ORAL
  Filled 2015-05-20: qty 1

## 2015-05-20 MED ORDER — HEPARIN SODIUM (PORCINE) 5000 UNIT/ML IJ SOLN: 5000.0000 [IU] | Freq: Three times a day (TID) | INTRAMUSCULAR | Status: DC

## 2015-05-20 MED ORDER — LORAZEPAM 1 MG PO TABS: 1.0000 mg | ORAL_TABLET | Freq: Two times a day (BID) | ORAL | Status: DC | PRN

## 2015-05-20 MED ORDER — INSULIN ASPART 100 UNIT/ML ~~LOC~~ SOLN
0.0000 [IU] | Freq: Three times a day (TID) | SUBCUTANEOUS | Status: DC
Start: 2015-05-21 — End: 2015-05-21
  Administered 2015-05-21: 2 [IU] via SUBCUTANEOUS
  Administered 2015-05-21: 3 [IU] via SUBCUTANEOUS
  Filled 2015-05-20: qty 2
  Filled 2015-05-20: qty 3

## 2015-05-20 MED ORDER — INSULIN GLARGINE 100 UNIT/ML ~~LOC~~ SOLN
22.0000 [IU] | Freq: Every day | SUBCUTANEOUS | Status: DC
  Administered 2015-05-20: 22 [IU] via SUBCUTANEOUS
  Filled 2015-05-20 (×2): qty 0.22

## 2015-05-20 MED ORDER — IPRATROPIUM-ALBUTEROL 20-100 MCG/ACT IN AERS: 1.0000 | INHALATION_SPRAY | Freq: Four times a day (QID) | RESPIRATORY_TRACT | Status: DC | PRN

## 2015-05-20 MED ORDER — NITROGLYCERIN 0.4 MG SL SUBL: 0.4000 mg | SUBLINGUAL_TABLET | SUBLINGUAL | Status: DC | PRN

## 2015-05-20 MED ORDER — LISINOPRIL 5 MG PO TABS
2.5000 mg | ORAL_TABLET | Freq: Every evening | ORAL | Status: DC
  Administered 2015-05-20: 2.5 mg via ORAL
  Filled 2015-05-20: qty 1

## 2015-05-20 MED ORDER — FLUTICASONE PROPIONATE 50 MCG/ACT NA SUSP
2.0000 | Freq: Every day | NASAL | Status: DC
  Administered 2015-05-20: 2 via NASAL
  Filled 2015-05-20: qty 16

## 2015-05-20 MED ORDER — ASPIRIN EC 325 MG PO TBEC: 325.0000 mg | DELAYED_RELEASE_TABLET | Freq: Every day | ORAL | Status: DC

## 2015-05-20 MED ORDER — WARFARIN SODIUM 1 MG PO TABS: 3.0000 mg | ORAL_TABLET | Freq: Every day | ORAL | Status: DC

## 2015-05-20 MED ORDER — WARFARIN - PHARMACIST DOSING INPATIENT
Freq: Every day | Status: DC
  Filled 2015-05-20 (×6): qty 1

## 2015-05-20 NOTE — Telephone Encounter (Signed)
Noted. Advised that patient seek care at EMS.

## 2015-05-20 NOTE — H&P (Signed)
[facility] NAME: Tracy Mann    MR#:  [id number]  DATE OF BIRTH:  [birth year]  DATE OF ADMISSION:  05/20/2015  PRIMARY CARE PHYSICIAN: Wilhemena Durie, MD   REQUESTING/REFERRING PHYSICIAN: Clearnce Hasten  CHIEF COMPLAINT:   Chief Complaint  Patient presents with  . Chest Pain    HISTORY OF PRESENT ILLNESS: Tracy Mann  is a 82 y.o. female with a known history of Chronic atrial fibrillation, on Coumadin, coronary artery disease, essential hypertension, hyperlipidemia, type 2 diabetes- had on and off left-sided chest pain going to her left arm for last few weeks, but every time he just last within 1-2 minutes. Today morning while she was fixing her breakfast she started having that pain on her left side of the chest going to her left arm with excessive sweating and feeling uneasy, the pain did not go for up to half an hour so she took her nitroglycerin tablet and rested for a while, it did not help much so she took another tablet of nitroglycerin. After those 2 tablets of nitroglycerin her pain subsided in a few minutes and she felt better. She called Dr. Donivan Scull office, and the nurse was suggested to go to emergency room right away. Troponin is negative in ER.  PAST MEDICAL HISTORY:   Past Medical History  Diagnosis Date  . Chronic atrial fibrillation (HCC)     a. CHA2DS2VASc = 6--> warfarin.  . Chronic combined systolic and diastolic CHF (congestive heart failure) ([place])     a. 10/2012 Echo: EF 35-40%, diff HK, mild MR, mild biatrial enlargement;  b. 11/2012 EF 45-50% by LV gram; c. echo 07/2014: EF 40-45%, mod conc LVH, mod MR, severe biatrial enlargement, PASP 45 mm Hg  . Chronic anticoagulation     a. warfarin.  . Acute myocardial infarction of inferior wall ([place])     a. Presumed to be 2/2 to a coronary embolus in setting of persistent Afib-->Cath relatively nl cors, EF 45-50% w/ severe distal inferior/apical inferior HK w/  aneursymal appearance.  . Essential hypertension   . Obesity   . Hyperlipidemia, mixed   . Type II diabetes mellitus ([place])   . Mixed Ischemic/Nonischemic Cardiomyopathy     a. 10/2012 Echo: EF 35-40%;  b. 11/2012 EF 45-50% by LV gram.    PAST SURGICAL HISTORY: Past Surgical History  Procedure Laterality Date  . Vesicovaginal fistula closure w/ tah  1996  . Tubal ligation  1968  . Cholecystectomy  1999  . Cardiac catheterization  11/2012    [facility];EF 45-50%    SOCIAL HISTORY:  Social History  Substance Use Topics  . Smoking status: Never Smoker   . Smokeless tobacco: Never Used  . Alcohol Use: No    FAMILY HISTORY:  Family History  Problem Relation Age of Onset  . Heart disease Mother   . Thyroid disease Father     DRUG ALLERGIES:  Allergies  Allergen Reactions  . Clarithromycin     Hives, headaches, hard time swelling, felt like throat was closing up  . Diphenhydramine   . Estrogens Conjugated   . Sulfonamide Derivatives Swelling    'tongue swelling'    REVIEW OF SYSTEMS:   CONSTITUTIONAL: No fever, fatigue or weakness.  EYES: No blurred or double vision.  EARS, NOSE, AND THROAT: No tinnitus or ear pain.  RESPIRATORY: No cough, shortness of breath, wheezing or hemoptysis.  CARDIOVASCULAR: Positive for chest pain, no orthopnea, edema.  GASTROINTESTINAL: No nausea,  vomiting, diarrhea or abdominal pain.  GENITOURINARY: No dysuria, hematuria.  ENDOCRINE: No polyuria, nocturia,  HEMATOLOGY: No anemia, easy bruising or bleeding SKIN: No rash or lesion. MUSCULOSKELETAL: No joint pain or arthritis.   NEUROLOGIC: No tingling, numbness, weakness.  PSYCHIATRY: No anxiety or depression.   MEDICATIONS AT HOME:  Prior to Admission medications   Medication Sig Start Date End Date Taking? Authorizing Provider  COMBIVENT RESPIMAT 20-100 MCG/ACT AERS respimat 1 puff every 6 (six) hours as needed.  07/03/14  Yes Historical Provider, MD  fluticasone (FLONASE) 50 MCG/ACT nasal  spray Place 2 sprays into both nostrils daily.   Yes Historical Provider, MD  furosemide (LASIX) 20 MG tablet Take 40 mg by mouth daily.   Yes Historical Provider, MD  HYDROcodone-acetaminophen (NORCO) 10-325 MG tablet Take 1 tablet by mouth every 6 (six) hours as needed. 03/31/15  Yes Richard Maceo Pro., MD  insulin glargine (LANTUS) 100 UNIT/ML injection Inject 22 Units into the skin at bedtime.   Yes Historical Provider, MD  insulin lispro (HUMALOG) 100 UNIT/ML KiwkPen Inject up to 10 units three times daily before each meal as directed 10/23/13  Yes Historical Provider, MD  levothyroxine (SYNTHROID, LEVOTHROID) 100 MCG tablet Take 100 mcg by mouth daily before breakfast. Reported on 05/20/2015 10/27/14  Yes Historical Provider, MD  lisinopril (PRINIVIL,ZESTRIL) 2.5 MG tablet Take 2.5 mg by mouth every evening.   Yes Historical Provider, MD  LORazepam (ATIVAN) 1 MG tablet Take 1 mg by mouth 2 (two) times daily as needed for anxiety.   Yes Historical Provider, MD  metoprolol succinate (TOPROL-XL) 25 MG 24 hr tablet Take 1 tablet (25 mg total) by mouth 2 (two) times daily. 10/21/13  Yes Minna Merritts, MD  nitroGLYCERIN (NITROSTAT) 0.4 MG SL tablet Place 1 tablet (0.4 mg total) under the tongue every 5 (five) minutes as needed. May repeat for up to 3 doses. 08/05/10  Yes Minna Merritts, MD  phenazopyridine (PYRIDIUM) 100 MG tablet Take 100 mg by mouth as needed. Reported on 01/21/2015   Yes Historical Provider, MD  rOPINIRole (REQUIP) 1 MG tablet Take 1 mg by mouth at bedtime.   Yes Historical Provider, MD  warfarin (COUMADIN) 3 MG tablet Take 3 mg by mouth daily.   Yes Historical Provider, MD  ZETIA 10 MG tablet Take 1 tablet by mouth Daily. 02/07/11  Yes Historical Provider, MD  [place] test strip  11/23/14   Historical Provider, MD      PHYSICAL EXAMINATION:   VITAL SIGNS: Blood pressure 120/62, pulse 60, temperature 97.9 F (36.6 C), temperature source Oral, resp. rate 15, height  5\' 5"  (1.651 m), weight 103.42 kg (228 lb), SpO2 95 %.  GENERAL:  82 y.o.-year-old patient lying in the bed with no acute distress.  EYES: Pupils equal, round, reactive to light and accommodation. No scleral icterus. Extraocular muscles intact.  HEENT: Head atraumatic, normocephalic. Oropharynx and nasopharynx clear.  NECK:  Supple, no jugular venous distention. No thyroid enlargement, no tenderness.  LUNGS: Normal breath sounds bilaterally, no wheezing, rales,rhonchi or crepitation. No use of accessory muscles of respiration.  CARDIOVASCULAR: S1, S2 normal. No murmurs, rubs, or gallops.  ABDOMEN: Soft, nontender, nondistended. Bowel sounds present. No organomegaly or mass.  EXTREMITIES: No pedal edema, cyanosis, or clubbing.  NEUROLOGIC: Cranial nerves II through XII are intact. Muscle strength 5/5 in all extremities. Sensation intact. Gait not checked.  PSYCHIATRIC: The patient is alert and oriented x 3.  SKIN: No obvious rash, lesion, or  ulcer.   LABORATORY PANEL:   CBC  Recent Labs Lab 05/20/15 1455  WBC 8.7  HGB 13.2  HCT 39.2  PLT 212  MCV 88.5  MCH 29.9  MCHC 33.8  RDW 13.1   ------------------------------------------------------------------------------------------------------------------  Chemistries   Recent Labs Lab 05/20/15 1455  NA 134*  K 4.3  CL 101  CO2 27  GLUCOSE 239*  BUN 14  CREATININE 0.74  CALCIUM 8.9   ------------------------------------------------------------------------------------------------------------------ estimated creatinine clearance is 68.1 mL/min (by C-G formula based on Cr of 0.74). ------------------------------------------------------------------------------------------------------------------ No results for input(s): TSH, T4TOTAL, T3FREE, THYROIDAB in the last 72 hours.  Invalid input(s): FREET3   Coagulation profile  Recent Labs Lab 05/20/15 1455  INR 3.17    ------------------------------------------------------------------------------------------------------------------- No results for input(s): DDIMER in the last 72 hours. -------------------------------------------------------------------------------------------------------------------  Cardiac Enzymes  Recent Labs Lab 05/20/15 1455  TROPONINI <0.03   ------------------------------------------------------------------------------------------------------------------ Invalid input(s): POCBNP  ---------------------------------------------------------------------------------------------------------------  Urinalysis No results found for: COLORURINE, APPEARANCEUR, LABSPEC, PHURINE, GLUCOSEU, HGBUR, BILIRUBINUR, KETONESUR, PROTEINUR, UROBILINOGEN, NITRITE, LEUKOCYTESUR   RADIOLOGY: Dg Chest 2 View  05/20/2015  CLINICAL DATA:  Cardiac shadow is mildly enlarged. EXAM: CHEST  2 VIEW COMPARISON:  08/21/2014 FINDINGS: Cardiac shadow is mildly enlarged but stable. The lungs are again hyperinflated. No focal infiltrate or sizable effusion is seen. No bony abnormality is noted. IMPRESSION: No acute abnormality noted. Electronically Signed   By: Inez Catalina M.D.   On: 05/20/2015 15:46    EKG: Orders placed or performed during the hospital encounter of 05/20/15  . EKG 12-Lead  . EKG 12-Lead  . ED EKG within 10 minutes  . ED EKG within 10 minutes    IMPRESSION AND PLAN:  * Angina   Monitor on telemetry, follow serial troponin, continue cardiac meds.   Check lipid panel, continue stating.   Cardiology consult with her primary cardiologist.   Burnis Medin keep her nothing by mouth from midnight as she may need either a stress test or catheterization tomorrow morning.  * Coronary artery disease   Continue cardiac medications, further interventions per cardiology team.  * Diabetes   We'll keep her on insulin sliding scale coverage.  * Atrial fibrillation   She is on Coumadin, INR is slightly  higher than 3, I will let pharmacy manage her Coumadin.  * Hypertension   Continue home medications.  * Chronic systolic heart failure   Impression fraction 40-45%, continue furosemide, no acute exacerbation symptoms.    All the records are reviewed and case discussed with ED provider. Management plans discussed with the patient, family and they are in agreement.  CODE STATUS: Full code Code Status History    This patient does not have a recorded code status. Please follow your organizational policy for patients in this situation.    Patient's son present in the room at the time of admission.   TOTAL TIME TAKING CARE OF THIS PATIENT:  50 minutes.    Vaughan Basta M.D on 05/20/2015   Between 7am to 6pm - Pager - [phone]  After 6pm go to [email or web address] - password [email or web address] [facility]  Office  [phone]  CC: Primary care physician; Wilhemena Durie, MD   Note: This dictation was prepared with Dragon dictation along with smaller phrase technology. Any transcriptional errors that result from this process are unintentional.

## 2015-05-20 NOTE — Consult Note (Signed)
ANTICOAGULATION CONSULT NOTE - Initial Consult  Pharmacy Consult for warfarin Indication: atrial fibrillation  Allergies  Allergen Reactions  . Clarithromycin     Hives, headaches, hard time swelling, felt like throat was closing up  . Diphenhydramine   . Estrogens Conjugated   . Sulfonamide Derivatives Swelling    'tongue swelling'    Patient Measurements: Height: 5\' 5"  (165.1 cm) Weight: 228 lb (103.42 kg) IBW/kg (Calculated) : 57 Heparin Dosing Weight:   Vital Signs: Temp: 97.9 F (36.6 C) (04/20 1452) Temp Source: Oral (04/20 1452) BP: 120/62 mmHg (04/20 1703) Pulse Rate: 60 (04/20 1703)  Labs:  Recent Labs  05/20/15 1455  HGB 13.2  HCT 39.2  PLT 212  LABPROT 31.9*  INR 3.17  CREATININE 0.74  TROPONINI <0.03    Estimated Creatinine Clearance: 68.1 mL/min (by C-G formula based on Cr of 0.74).   Medical History: Past Medical History  Diagnosis Date  . Chronic atrial fibrillation (HCC)     a. CHA2DS2VASc = 6--> warfarin.  . Chronic combined systolic and diastolic CHF (congestive heart failure) ([place])     a. 10/2012 Echo: EF 35-40%, diff HK, mild MR, mild biatrial enlargement;  b. 11/2012 EF 45-50% by LV gram; c. echo 07/2014: EF 40-45%, mod conc LVH, mod MR, severe biatrial enlargement, PASP 45 mm Hg  . Chronic anticoagulation     a. warfarin.  . Acute myocardial infarction of inferior wall ([place])     a. Presumed to be 2/2 to a coronary embolus in setting of persistent Afib-->Cath relatively nl cors, EF 45-50% w/ severe distal inferior/apical inferior HK w/ aneursymal appearance.  . Essential hypertension   . Obesity   . Hyperlipidemia, mixed   . Type II diabetes mellitus ([place])   . Mixed Ischemic/Nonischemic Cardiomyopathy     a. 10/2012 Echo: EF 35-40%;  b. 11/2012 EF 45-50% by LV gram.    Medications:  Scheduled:  . aspirin EC  325 mg Oral Daily  . [START ON 05/21/2015] insulin aspart  0-9 Units Subcutaneous TID WC    Assessment: Pt is a 82 year  old female with a PMH of afib on warfarin 3mg  daily at home. Pt presents with chest pain. INR on admission is slighly supratherapeutic at 3.1  Goal of Therapy:  INR 2-3 Monitor platelets by anticoagulation protocol: Yes   Plan:  Will give 1.5mg  tonight at follow up with INR in the AM.  Ramond Dial, Pharm.D Clinical Pharmacist   05/20/2015,6:06 PM

## 2015-05-20 NOTE — Telephone Encounter (Signed)
Received call from HF clinic that pt had left message at their office c/o chest pain.  Before I could call pt back, received notification that she was calling our office.  She reports left sided chest pain that radiates to her left arm. Reports sx have been worsening for the last 6 weeks to 2 months.  She is having active chest pain right now, nausea w/ dry heaves, diaphoresis and SOB. She called her children and her son told her to take a nitro. She has taken 1 w/ little relief and is going to take another. Advised her that since she is having active chest pain, she will need to call 911 or proceed to nearest ED.  She refuses, asks to come to our office for eval. Advised her that we are a clinic and cannot treat her active chest pain. After some discussion, she is only agreeable to calling her son and getting his opinion.  Attempted to call pt's son listed on DPR, but did not get an answer at his home #.

## 2015-05-20 NOTE — ED Provider Notes (Signed)
[facility] Emergency Department Provider Note  ____________________________________________  Time seen: Approximately [id number] PM  I have reviewed the triage vital signs and the nursing notes.   HISTORY  Chief Complaint Chest Pain   HPI Tracy Mann is a 82 y.o. female with a history of coronary artery disease as well as heart failure who is presenting to the emergency department with an episode of chest pain that started about 2 hours ago. She says that she was at home when she experienced an aching chest pain in the left side of her chest that radiated down her left arm. She said this was associated with diaphoresis as well as shortness of breath and nausea. She says that she has had several other recent episodes of chest pain that event associated with exertion. She says that she took 2 nitroglycerin tabs and the chest pain resolved. She is pain-free at this time. She does take Coumadin for her atrial fibrillation.   Past Medical History  Diagnosis Date  . Chronic atrial fibrillation (HCC)     a. CHA2DS2VASc = 6--> warfarin.  . Chronic combined systolic and diastolic CHF (congestive heart failure) ([place])     a. 10/2012 Echo: EF 35-40%, diff HK, mild MR, mild biatrial enlargement;  b. 11/2012 EF 45-50% by LV gram; c. echo 07/2014: EF 40-45%, mod conc LVH, mod MR, severe biatrial enlargement, PASP 45 mm Hg  . Chronic anticoagulation     a. warfarin.  . Acute myocardial infarction of inferior wall ([place])     a. Presumed to be 2/2 to a coronary embolus in setting of persistent Afib-->Cath relatively nl cors, EF 45-50% w/ severe distal inferior/apical inferior HK w/ aneursymal appearance.  . Essential hypertension   . Obesity   . Hyperlipidemia, mixed   . Type II diabetes mellitus ([place])   . Mixed Ischemic/Nonischemic Cardiomyopathy     a. 10/2012 Echo: EF 35-40%;  b. 11/2012 EF 45-50% by LV gram.    Patient Active Problem List   Diagnosis Date Noted  . Weakness  03/03/2015  . Mixed Ischemic/Nonischemic Cardiomyopathy   . Type II diabetes mellitus ([place])   . Chronic atrial fibrillation ([place])   . Chronic combined systolic and diastolic CHF (congestive heart failure) ([place])   . Chronic anticoagulation   . Absolute anemia 07/08/2014  . Airway hyperreactivity 07/08/2014  . Cervical muscle strain 07/08/2014  . PNA (pneumonia) 07/08/2014  . Chest pressure 07/08/2014  . Clinical depression 07/08/2014  . Degeneration of lumbar or lumbosacral intervertebral disc 07/08/2014  . Accumulation of fluid in tissues 07/08/2014  . Anxiety, generalized 07/08/2014  . Acid reflux 07/08/2014  . Folliculitis [id number]  . Adult hypothyroidism 07/08/2014  . Broken leg 07/08/2014  . Cannot sleep 07/08/2014  . Obstructive sleep apnea 07/08/2014  . Arthritis, degenerative 07/08/2014  . Onychia of finger 07/08/2014  . Psoriasis 07/08/2014  . Restless legs syndrome 07/08/2014  . Adult BMI 30+ 07/08/2014  . Calculus of kidney 10/31/2011  . Bladder infection, chronic 10/31/2011  . Incomplete bladder emptying 10/31/2011  . Obesity 11/02/2010  . SHORTNESS OF BREATH 02/01/2010  . Hyperlipidemia 09/16/2008  . HYPERTENSION, HEART CONTROLLED W/O ASSOC CHF 09/16/2008  . CAD, NATIVE VESSEL 09/16/2008  . Atrial fibrillation ([place]) 09/16/2008    Past Surgical History  Procedure Laterality Date  . Vesicovaginal fistula closure w/ tah  1996  . Tubal ligation  1968  . Cholecystectomy  1999  . Cardiac catheterization  11/2012    [facility];EF 45-50%  Current Outpatient Rx  Name  Route  Sig  Dispense  Refill  . ACCU-CHEK AVIVA PLUS test strip                 Dispense as written.   . ciprofloxacin (CIPRO) 250 MG tablet   Oral   Take 1 tablet (250 mg total) by mouth 2 (two) times daily.   6 tablet   6   . COMBIVENT RESPIMAT 20-100 MCG/ACT AERS respimat      1 puff every 6 (six) hours as needed.            Dispense as written.   . fluticasone (FLONASE) 50  MCG/ACT nasal spray      2 (TWO) SPRAYS INTRANASALLY SPRAYS INTRANASALLY, NASAL, DAILY      12   . furosemide (LASIX) 20 MG tablet      TAKE 2 TABLETS ONCE DAILY   60 tablet   5   . HYDROcodone-acetaminophen (NORCO) 10-325 MG tablet   Oral   Take 1 tablet by mouth every 6 (six) hours as needed.   120 tablet   0     05/31/2015   . insulin glargine (LANTUS) 100 UNIT/ML injection   Subcutaneous   Inject 22 Units into the skin at bedtime.         . insulin lispro (HUMALOG KWIKPEN) 100 UNIT/ML KiwkPen      INJECT UP TO 10 UNITS SUBCUTANEOUSLY THREE TIMES DAILY BEFORE EACH MEAL(S) AS DIRECTED         . insulin lispro (HUMALOG) 100 UNIT/ML KiwkPen      Inject up to 10 units three times daily before each meal as directed         . levothyroxine (SYNTHROID, LEVOTHROID) 100 MCG tablet   Oral   Take 100 mcg by mouth daily before breakfast.          . lisinopril (PRINIVIL,ZESTRIL) 2.5 MG tablet      TAKE 1 TABLET BY MOUTH AT BEDTIME   30 tablet   11   . LORazepam (ATIVAN) 1 MG tablet      TAKE 1 TABLET PP TWICE A DAY FOR ANXIETY   60 tablet   4     Not to exceed 4 additional fills before 06/30/2015 ...   . magnesium sulfate 50 % injection   Oral   Take by mouth. Reported on 01/21/2015         . metoprolol succinate (TOPROL-XL) 25 MG 24 hr tablet   Oral   Take 1 tablet (25 mg total) by mouth 2 (two) times daily.   180 tablet   3   . nitroGLYCERIN (NITROSTAT) 0.4 MG SL tablet   Sublingual   Place 1 tablet (0.4 mg total) under the tongue every 5 (five) minutes as needed. May repeat for up to 3 doses.   90 tablet   3   . phenazopyridine (PYRIDIUM) 100 MG tablet   Oral   Take 100 mg by mouth as needed. Reported on 01/21/2015         . rOPINIRole (REQUIP) 0.25 MG tablet      TAKE 1 TABLET BY MOUTH AT BEDTIME   30 tablet   12   . warfarin (COUMADIN) 3 MG tablet      TAKE 1 TABLET (3 MG TOTAL) BY MOUTH DAILY.      12   . warfarin (COUMADIN) 4 MG  tablet               .  ZETIA 10 MG tablet   Oral   Take 1 tablet by mouth Daily.           Allergies Clarithromycin; Diphenhydramine; Estrogens conjugated; and Sulfonamide derivatives  Family History  Problem Relation Age of Onset  . Heart disease Mother   . Thyroid disease Father     Social History Social History  Substance Use Topics  . Smoking status: Never Smoker   . Smokeless tobacco: Never Used  . Alcohol Use: No    Review of Systems Constitutional: No fever/chills Eyes: No visual changes. ENT: No sore throat. Cardiovascular: As above Respiratory: As above Gastrointestinal: No abdominal pain.  No diarrhea.  No constipation. Genitourinary: Negative for dysuria. Musculoskeletal: Negative for back pain. Skin: Negative for rash. Neurological: Negative for headaches, focal weakness or numbness.  10-point ROS otherwise negative.  ____________________________________________   PHYSICAL EXAM:  VITAL SIGNS: ED Triage Vitals  Enc Vitals Group     BP 05/20/15 1452 118/51 mmHg     Pulse Rate 05/20/15 1452 89     Resp 05/20/15 1452 20     Temp 05/20/15 1452 97.9 F (36.6 C)     Temp Source 05/20/15 1452 Oral     SpO2 05/20/15 1452 94 %     Weight 05/20/15 1452 228 lb (103.42 kg)     Height 05/20/15 1452 5\' 5"  (1.651 m)     Head Cir --      Peak Flow --      Pain Score 05/20/15 1453 3     Pain Loc --      Pain Edu? --      Excl. in [place]? --     Constitutional: Alert and oriented. Well appearing and in no acute distress. Eyes: Conjunctivae are normal. PERRL. EOMI. Head: Atraumatic. Nose: No congestion/rhinnorhea. Mouth/Throat: Mucous membranes are moist.  Oropharynx non-erythematous. Neck: No stridor.   Cardiovascular: Normal rate, regular rhythm. Grossly normal heart sounds.  Good peripheral circulation. Respiratory: Normal respiratory effort.  No retractions. Lungs CTAB. Gastrointestinal: Soft and nontender. No distention. No abdominal bruits. No  CVA tenderness. Musculoskeletal: Mild bilateral lower extremity edema which the patient says is her baseline..  No joint effusions. Neurologic:  Normal speech and language. No gross focal neurologic deficits are appreciated.  Skin:  Skin is warm, dry and intact. No rash noted. Psychiatric: Mood and affect are normal. Speech and behavior are normal.  ____________________________________________   LABS (all labs ordered are listed, but only abnormal results are displayed)  Labs Reviewed  BASIC METABOLIC PANEL - Abnormal; Notable for the following:    Sodium 134 (*)    Glucose, Bld 239 (*)    All other components within normal limits  PROTIME-INR - Abnormal; Notable for the following:    Prothrombin Time 31.9 (*)    All other components within normal limits  CBC  TROPONIN I   ____________________________________________  EKG  ED ECG REPORT I, Doran Stabler, the attending physician, personally viewed and interpreted this ECG.   Date: 05/20/2015  EKG Time: 1448  Rate: 87  Rhythm: atrial fibrillation, rate 87  Axis: Normal  Intervals: Left bundle branch block  ST&T Change: No ST segment elevation or depression. No abnormal T-wave inversion. Left bundle branch block is old.  ____________________________________________  RADIOLOGY  IMPRESSION: No acute abnormality noted.   Electronically Signed By: Inez Catalina M.D. On: 05/20/2015 15:46 ____________________________________________   PROCEDURES  ____________________________________________   INITIAL IMPRESSION / ASSESSMENT AND PLAN / ED COURSE  Pertinent labs &  imaging results that were available during my care of the patient were reviewed by me and considered in my medical decision making (see chart for details).  Patient with classic story for cardiac chest pain and multiple risk factors. Advised the patient that she will need to stay at the hospital for further workup and observation. She understands  the plan and is willing to comply. Signed out to Dr. Margaretmary Eddy.  ____________________________________________   FINAL CLINICAL IMPRESSION(S) / ED DIAGNOSES  Chest pain.    Orbie Pyo, MD 05/20/15 [id number]

## 2015-05-20 NOTE — Telephone Encounter (Signed)
Pt calling stating she was having pain down her left side in chest Under arm down to elebow Took some nitro, started this morning It was constant.  Still feels it, just doesn't feel well/  Would like to know what to do Please advise [employer] nurse, they told her to call us Not been doing well for about 66m now

## 2015-05-20 NOTE — ED Notes (Signed)
Pt presents with left sided chest pain 2 hrs pta, pt reports she took 2 SL NTG and pain decreased from level 10 to 5. Pain now at level 3. Pt reports she also took a lorazepam. Pt in NAD.

## 2015-05-20 NOTE — Plan of Care (Signed)
Problem: Education: Goal: Knowledge of [facility] Education information/materials will improve Outcome: Progressing Educated patient on [place] policy and the plan for her while she is here.

## 2015-05-20 NOTE — Telephone Encounter (Signed)
Pt called back stating that her pain has subsided.  She would like to know if we "think she will be ok". Spoke w/ Christell Faith, PA and advised him of conversation.  He agrees that pt should proceed to ED. Advised pt of our recommendation.  She still expresses hesitation, as "I'm just sitting here, I don't know what else they can do for me". Advised her of further testing to evaluate if any damage has occurred to her hear muscle (EKG, troponin, cardiac enzymes, etc). She states again that she will consider it, but does not agree to evaluation right now.

## 2015-05-21 ENCOUNTER — Observation Stay (HOSPITAL_BASED_OUTPATIENT_CLINIC_OR_DEPARTMENT_OTHER)
Admit: 2015-05-21 | Discharge: 2015-05-21 | Disposition: A | Discharge status: Home / Self Care | Payer: Medicare Other | Attending: Internal Medicine | Admitting: Internal Medicine

## 2015-05-21 ENCOUNTER — Encounter: Payer: Self-pay | Admitting: Nurse Practitioner

## 2015-05-21 ENCOUNTER — Telehealth: Payer: Self-pay | Admitting: Emergency Medicine

## 2015-05-21 ENCOUNTER — Ambulatory Visit: Payer: Medicare Other | Attending: Cardiovascular Disease

## 2015-05-21 ENCOUNTER — Telehealth: Payer: Self-pay | Admitting: Cardiovascular Disease

## 2015-05-21 DIAGNOSIS — I510 Cardiac septal defect, acquired: Secondary | ICD-10-CM | POA: Insufficient documentation

## 2015-05-21 DIAGNOSIS — I482 Chronic atrial fibrillation: Secondary | ICD-10-CM | POA: Diagnosis not present

## 2015-05-21 DIAGNOSIS — E785 Hyperlipidemia, unspecified: Secondary | ICD-10-CM | POA: Diagnosis not present

## 2015-05-21 DIAGNOSIS — R079 Chest pain, unspecified: Secondary | ICD-10-CM

## 2015-05-21 DIAGNOSIS — I5022 Chronic systolic (congestive) heart failure: Secondary | ICD-10-CM | POA: Diagnosis not present

## 2015-05-21 DIAGNOSIS — I208 Other forms of angina pectoris: Secondary | ICD-10-CM | POA: Diagnosis not present

## 2015-05-21 DIAGNOSIS — I10 Essential (primary) hypertension: Secondary | ICD-10-CM | POA: Diagnosis not present

## 2015-05-21 DIAGNOSIS — E119 Type 2 diabetes mellitus without complications: Secondary | ICD-10-CM | POA: Diagnosis not present

## 2015-05-21 LAB — TROPONIN I: Troponin I: <0.03 ng/mL (ref <0.031)

## 2015-05-21 LAB — BASIC METABOLIC PANEL
ANION GAP: 6 (ref 5–15)
BUN: 12 mg/dL (ref 6–20)
CO2: 29 mmol/L (ref 22–32)
Calcium: 8.7 mg/dL — ABNORMAL LOW (ref 8.9–10.3)
Chloride: 102 mmol/L (ref 101–111)
Creatinine, Ser: 0.77 mg/dL (ref 0.44–1.00)
Glucose, Bld: 188 mg/dL — ABNORMAL HIGH (ref 65–99)
POTASSIUM: 4 mmol/L (ref 3.5–5.1)
SODIUM: 137 mmol/L (ref 135–145)

## 2015-05-21 LAB — LIPID PANEL
CHOL/HDL RATIO: 6 ratio
Cholesterol: 203 mg/dL — ABNORMAL HIGH (ref 0–200)
HDL: 34 mg/dL — AB (ref >40)
LDL Cholesterol: 124 mg/dL — ABNORMAL HIGH (ref 0–99)
TRIGLYCERIDES: 225 mg/dL — AB (ref <150)
VLDL: 45 mg/dL — ABNORMAL HIGH (ref 0–40)

## 2015-05-21 LAB — NM MYOCAR MULTI W/SPECT W/WALL MOTION / EF
CHL CUP NUCLEAR SDS: 4
CSEPEW: 1 METS
CSEPHR: 68 %
Exercise duration (min): 0 min
Exercise duration (sec): 0 s
LV dias vol: 119 mL (ref 46–106)
LV sys vol: 64 mL
MPHR: 141 {beats}/min
Peak HR: 96 {beats}/min
Rest HR: 65 {beats}/min
SRS: 3
SSS: 4
TID: 1.02

## 2015-05-21 LAB — CBC
HEMATOCRIT: 38.9 % (ref 35.0–47.0)
HEMOGLOBIN: 13.2 g/dL (ref 12.0–16.0)
MCH: 29.7 pg (ref 26.0–34.0)
MCHC: 33.8 g/dL (ref 32.0–36.0)
MCV: 87.8 fL (ref 80.0–100.0)
Platelets: 231 10*3/uL (ref 150–440)
RBC: 4.43 MIL/uL (ref 3.80–5.20)
RDW: 12.9 % (ref 11.5–14.5)
WBC: 10.2 10*3/uL (ref 3.6–11.0)

## 2015-05-21 LAB — PROTIME-INR
INR: 3.09
Prothrombin Time: 31.3 seconds — ABNORMAL HIGH (ref 11.4–15.0)

## 2015-05-21 LAB — ECHOCARDIOGRAM COMPLETE
HEIGHTINCHES: 65 in
Weight: 3635.2 oz

## 2015-05-21 LAB — GLUCOSE, CAPILLARY
Glucose-Capillary: 193 mg/dL — ABNORMAL HIGH (ref 65–99)
Glucose-Capillary: 233 mg/dL — ABNORMAL HIGH (ref 65–99)

## 2015-05-21 MED ORDER — WARFARIN SODIUM 1 MG PO TABS: 1.5000 mg | ORAL_TABLET | Freq: Once | ORAL | Status: DC

## 2015-05-21 MED ORDER — TECHNETIUM TC 99M SESTAMIBI - CARDIOLITE
28.5700 | Freq: Once | INTRAVENOUS | Status: AC | PRN
  Administered 2015-05-21: 28.57 via INTRAVENOUS

## 2015-05-21 MED ORDER — TECHNETIUM TC 99M SESTAMIBI - CARDIOLITE
13.9400 | Freq: Once | INTRAVENOUS | Status: AC | PRN
  Administered 2015-05-21: 12:00:00 13.94 via INTRAVENOUS

## 2015-05-21 MED ORDER — NITROGLYCERIN 0.4 MG SL SUBL: 0.4000 mg | SUBLINGUAL_TABLET | SUBLINGUAL | Status: DC | PRN

## 2015-05-21 MED ORDER — ASPIRIN EC 81 MG PO TBEC
81.0000 mg | DELAYED_RELEASE_TABLET | Freq: Every day | ORAL | Status: DC
  Administered 2015-05-21: 81 mg via ORAL
  Filled 2015-05-21: qty 1

## 2015-05-21 MED ORDER — ASPIRIN 81 MG PO TBEC: 81.0000 mg | DELAYED_RELEASE_TABLET | Freq: Every day | ORAL | Status: DC

## 2015-05-21 MED ORDER — REGADENOSON 0.4 MG/5ML IV SOLN
0.4000 mg | Freq: Once | INTRAVENOUS | Status: AC
  Administered 2015-05-21: 0.4 mg via INTRAVENOUS

## 2015-05-21 NOTE — Progress Notes (Signed)
*  PRELIMINARY RESULTS* Echocardiogram 2D Echocardiogram has been performed.  Tracy Mann 05/21/2015, 11:57 AM

## 2015-05-21 NOTE — Discharge Instructions (Signed)

## 2015-05-21 NOTE — Progress Notes (Signed)
Spoke with Dr. Fletcher Anon, stress test has not been read yet, but he will call back to let me know results. Will update Dr. Leslye Peer per his request as soon as I know something.

## 2015-05-21 NOTE — Telephone Encounter (Signed)
[facility] calling pt being discharged today coming 06/07/15 to see Christell Faith

## 2015-05-21 NOTE — Discharge Summary (Signed)
[facility] [place] NAME: Tracy Mann    MR#:  [id number]  DATE OF BIRTH:  02/02/1933  DATE OF ADMISSION:  05/20/2015 ADMITTING PHYSICIAN: Vaughan Basta, MD  DATE OF DISCHARGE: 05/21/2015  PRIMARY CARE PHYSICIAN: Wilhemena Durie, MD    ADMISSION DIAGNOSIS:  Chest pain, unspecified chest pain type [R07.9]  DISCHARGE DIAGNOSIS:  Principal Problem:   Angina at rest [facility])   SECONDARY DIAGNOSIS:   Past Medical History  Diagnosis Date  . Chronic atrial fibrillation (HCC)     a. CHA2DS2VASc = 6--> warfarin.  . Chronic combined systolic and diastolic CHF (congestive heart failure) ([place])     a. 10/2012 Echo: EF 35-40%, diff HK, mild MR, mild biatrial enlargement;  b. 11/2012 EF 45-50% by LV gram; c. echo 07/2014: EF 40-45%, mod conc LVH, mod MR, severe biatrial enlargement, PASP 45 mm Hg  . Chronic anticoagulation     a. warfarin.  . Acute myocardial infarction of inferior wall ([place])     a. 10/2005 - Presumed to be 2/2 to a coronary embolus in setting of persistent Afib-->Cath relatively nl cors, EF 45-50% w/ severe distal inferior/apical inferior HK w/ aneursymal appearance.  . Essential hypertension   . Obesity   . Hyperlipidemia, mixed   . Type II diabetes mellitus ([place])   . Mixed Ischemic/Nonischemic Cardiomyopathy     a. 10/2012 Echo: EF 35-40%;  b. 11/2012 EF 45-50% by LV gram.    HOSPITAL COURSE:   1.  Chest pain, atypical. Patient had 3 cardiac enzymes that were negative. Stress test still pending at this point if negative I will discharge home. I wrote the patient has prescription for nitroglycerin. Her nitroglycerin at home is old. 2. Atrial fibrillation on chronic anticoagulation. Continue Coumadin 3. Type 2 diabetes on insulin continue usual medications 4. Essential hypertension continue usual medications 5. Hyperlipidemia unspecified continue zetia 6. History of CHF no signs on this hospital stay  DISCHARGE  CONDITIONS:   Satisfactory  CONSULTS OBTAINED:  Treatment Team:  Wellington Hampshire, MD Minna Merritts, MD  DRUG ALLERGIES:   Allergies  Allergen Reactions  . Clarithromycin     Hives, headaches, hard time swelling, felt like throat was closing up  . Diphenhydramine   . Estrogens Conjugated   . Sulfonamide Derivatives Swelling    'tongue swelling'    DISCHARGE MEDICATIONS:   Current Discharge Medication List    CONTINUE these medications which have CHANGED   Details  nitroGLYCERIN (NITROSTAT) 0.4 MG SL tablet Place 1 tablet (0.4 mg total) under the tongue every 5 (five) minutes as needed. May repeat for up to 3 doses. Qty: 30 tablet, Refills: 0      CONTINUE these medications which have NOT CHANGED   Details  COMBIVENT RESPIMAT 20-100 MCG/ACT AERS respimat 1 puff every 6 (six) hours as needed.     fluticasone (FLONASE) 50 MCG/ACT nasal spray Place 2 sprays into both nostrils daily.    furosemide (LASIX) 20 MG tablet Take 40 mg by mouth daily.    HYDROcodone-acetaminophen (NORCO) 10-325 MG tablet Take 1 tablet by mouth every 6 (six) hours as needed. Qty: 120 tablet, Refills: 0   Associated Diagnoses: Primary osteoarthritis involving multiple joints    insulin glargine (LANTUS) 100 UNIT/ML injection Inject 22 Units into the skin at bedtime.    insulin lispro (HUMALOG) 100 UNIT/ML KiwkPen Inject up to 10 units three times daily before each meal as directed    levothyroxine (SYNTHROID,  LEVOTHROID) 100 MCG tablet Take 100 mcg by mouth daily before breakfast. Reported on 05/20/2015    lisinopril (PRINIVIL,ZESTRIL) 2.5 MG tablet Take 2.5 mg by mouth every evening.    LORazepam (ATIVAN) 1 MG tablet Take 1 mg by mouth 2 (two) times daily as needed for anxiety.    metoprolol succinate (TOPROL-XL) 25 MG 24 hr tablet Take 1 tablet (25 mg total) by mouth 2 (two) times daily. Qty: 180 tablet, Refills: 3    phenazopyridine (PYRIDIUM) 100 MG tablet Take 100 mg by mouth as  needed. Reported on 01/21/2015    rOPINIRole (REQUIP) 1 MG tablet Take 1 mg by mouth at bedtime.    warfarin (COUMADIN) 3 MG tablet Take 3 mg by mouth daily.    ZETIA 10 MG tablet Take 1 tablet by mouth Daily.    ACCU-CHEK AVIVA PLUS test strip          DISCHARGE INSTRUCTIONS:   Follow up PMD one week follow-up with Dr. Rockey Situ 2 weeks  If you experience worsening of your admission symptoms, develop shortness of breath, life threatening emergency, suicidal or homicidal thoughts you must seek medical attention immediately by calling 911 or calling your MD immediately  if symptoms less severe.  You Must read complete instructions/literature along with all the possible adverse reactions/side effects for all the Medicines you take and that have been prescribed to you. Take any new Medicines after you have completely understood and accept all the possible adverse reactions/side effects.   Please note  You were cared for by a hospitalist during your hospital stay. If you have any questions about your discharge medications or the care you received while you were in the hospital after you are discharged, you can call the unit and asked to speak with the hospitalist on call if the hospitalist that took care of you is not available. Once you are discharged, your primary care physician will handle any further medical issues. Please note that NO REFILLS for any discharge medications will be authorized once you are discharged, as it is imperative that you return to your primary care physician (or establish a relationship with a primary care physician if you do not have one) for your aftercare needs so that they can reassess your need for medications and monitor your lab values.    Today   CHIEF COMPLAINT:   Chief Complaint  Patient presents with  . Chest Pain    HISTORY OF PRESENT ILLNESS:  Tracy Mann  is a 82 y.o. female with a known history of CAD presents with chest pain   VITAL  SIGNS:  Blood pressure 121/56, pulse 50, temperature 97.8 F (36.6 C), temperature source Oral, resp. rate 18, height 5\' 5"  (1.651 m), weight 103.057 kg (227 lb 3.2 oz), SpO2 94 %.    PHYSICAL EXAMINATION:  GENERAL:  82 y.o.-year-old patient lying in the bed with no acute distress.  EYES: Pupils equal, round, reactive to light and accommodation. No scleral icterus. Extraocular muscles intact.  HEENT: Head atraumatic, normocephalic. Oropharynx and nasopharynx clear.  NECK:  Supple, no jugular venous distention. No thyroid enlargement, no tenderness.  LUNGS: Normal breath sounds bilaterally, no wheezing, rales,rhonchi or crepitation. No use of accessory muscles of respiration.  CARDIOVASCULAR: S1, S2 normal. No murmurs, rubs, or gallops.  ABDOMEN: Soft, non-tender, non-distended. Bowel sounds present. No organomegaly or mass.  EXTREMITIES:  trace edema,  no cyanosis, or clubbing.  NEUROLOGIC: Cranial nerves II through XII are intact. Muscle strength 5/5 in all extremities.  Sensation intact. Gait not checked.  PSYCHIATRIC: The patient is alert and oriented x 3.  SKIN: No obvious rash, lesion, or ulcer.   DATA REVIEW:   CBC  Recent Labs Lab 05/21/15 0142  WBC 10.2  HGB 13.2  HCT 38.9  PLT 231    Chemistries   Recent Labs Lab 05/21/15 0142  NA 137  K 4.0  CL 102  CO2 29  GLUCOSE 188*  BUN 12  CREATININE 0.77  CALCIUM 8.7*    Cardiac Enzymes  Recent Labs Lab 05/21/15 0142  TROPONINI <0.03    RADIOLOGY:  Dg Chest 2 View  05/20/2015  CLINICAL DATA:  Cardiac shadow is mildly enlarged. EXAM: CHEST  2 VIEW COMPARISON:  08/21/2014 FINDINGS: Cardiac shadow is mildly enlarged but stable. The lungs are again hyperinflated. No focal infiltrate or sizable effusion is seen. No bony abnormality is noted. IMPRESSION: No acute abnormality noted. Electronically Signed   By: Inez Catalina M.D.   On: 05/20/2015 15:46    Management plans discussed with the patient, And she is  in  agreement.  CODE STATUS:     Code Status Orders        Start     Ordered   05/20/15 2011  Full code   Continuous     05/20/15 2010    Code Status History    Date Active Date Inactive Code Status Order ID Comments User Context   This patient has a current code status but no historical code status.      TOTAL TIME TAKING CARE OF THIS PATIENT: 35 minutes.    Loletha Grayer M.D on 05/21/2015 at 3:46 PM  Between 7am to 6pm - Pager - [phone]  After 6pm go to [email or web address] - password [email or web address]  [facility] Office  [phone]  CC: Primary care physician; Wilhemena Durie, MD

## 2015-05-21 NOTE — Progress Notes (Signed)
Per CCMD, patient has had some pauses this morning with longest one being 2.3 seconds. Dr. Fletcher Anon notified, no new orders at this time. Patient is asymptomatic, resting quietly in bed. Will continue to monitor.

## 2015-05-21 NOTE — Progress Notes (Signed)
Inpatient Diabetes Program Recommendations  AACE/ADA: New Consensus Statement on Inpatient Glycemic Control (2015)  Target Ranges:  Prepandial:   less than 140 mg/dL      Peak postprandial:   less than 180 mg/dL (1-2 hours)      Critically ill patients:  140 - 180 mg/dL   Review of Glycemic Control  Results for ARIAS, BARMES (MRN [id number]) as of 05/21/2015 14:06  Ref. Range 05/20/2015 20:16 05/21/2015 07:34  Glucose-Capillary Latest Ref Range: 65-99 mg/dL 146 (H) 193 (H)    Diabetes history: Type 2 Outpatient Diabetes medications: Lantus 22 units qhs, Novolog 10 units tid Current orders for Inpatient glycemic control: Novolog 0-9 units tid, Lantus 22 units qhs  Inpatient Diabetes Program Recommendations:  Consider ordering an A1C to determine blood sugar control over the past 2-3 months. Consider ordering Novolog 0-5 units qhs   Gentry Fitz, RN, [place], [place], CDE Diabetes Coordinator Inpatient Diabetes Program  [phone] (Team Pager) [phone] ([place]) 05/21/2015 2:07 PM

## 2015-05-21 NOTE — Progress Notes (Signed)
Pt back from stress test. Dr. Leslye Peer aware - diet resumed per MD. No word on results yet.

## 2015-05-21 NOTE — Consult Note (Signed)
CARDIOLOGY CONSULT NOTE  Patient ID: Tracy Mann MRN: [id number] DOB/AGE: 1933/10/27 82 y.o.  Admit date: 05/20/2015 Referring Physician : Dr. Boyce Medici Primary Cardiologist : Dr. Rockey Situ Reason for Consultation : chest pain  HPI: Tracy Mann is a 82 year old woman with a PMH significant for chronic atrial fibrillation on Coumadin therapy, diastolic dysfunction, hypertension, hyperlipidemia, morbid obesity, and history of an inferior wall myocardial infarction that was felt to be secondary to a thrombus from atrial fib. in October 2007 at [facility], previous episodes of chest pain who present to the emergency room with chest pain. The chest pain was located in the left upper chest area close to the axilla and radiating to the left arm around the left elbow. It was described as tightness and pain which lasted for at least 30 minutes. She initially forgot to take nitroglycerin but when she did, it was eased after she took the second nitroglycerin. It was associated with shortness of breath and an anxious feeling. She also had some sweating. She reports previous similar episodes in the past both at rest and with physical activities. Most of these episodes happen at rest. She denies any heartburn or symptoms of GERD. She lives by herself and she became very anxious when the chest pain started. She does have known history of anxiety. Previous echocardiogram with moderate pulmonary hypertension. She had cardiac catheterization done in 2014 which showed mild nonobstructive one-vessel coronary artery disease involving the right coronary artery. No ischemic workup since then. Upon presentation, the patient was found to have negative troponin, unremarkable chest x-ray and EKG that showed left bundle branch block which is chronic.    A 10 point review of system was performed. It is negative other than that mentioned in the history of present illness.   Past Medical History  Diagnosis  Date  . Chronic atrial fibrillation (HCC)     a. CHA2DS2VASc = 6--> warfarin.  . Chronic combined systolic and diastolic CHF (congestive heart failure) ([place])     a. 10/2012 Echo: EF 35-40%, diff HK, mild MR, mild biatrial enlargement;  b. 11/2012 EF 45-50% by LV gram; c. echo 07/2014: EF 40-45%, mod conc LVH, mod MR, severe biatrial enlargement, PASP 45 mm Hg  . Chronic anticoagulation     a. warfarin.  . Acute myocardial infarction of inferior wall ([place])     a. 10/2005 - Presumed to be 2/2 to a coronary embolus in setting of persistent Afib-->Cath relatively nl cors, EF 45-50% w/ severe distal inferior/apical inferior HK w/ aneursymal appearance.  . Essential hypertension   . Obesity   . Hyperlipidemia, mixed   . Type II diabetes mellitus ([place])   . Mixed Ischemic/Nonischemic Cardiomyopathy     a. 10/2012 Echo: EF 35-40%;  b. 11/2012 EF 45-50% by LV gram.    Family History  Problem Relation Age of Onset  . Heart disease Mother   . Thyroid disease Father     Social History   Social History  . Marital Status: Widowed    Spouse Name: widowed  . Number of Children: 4  . Years of Education: 14   Occupational History  . retired    Social History Main Topics  . Smoking status: Never Smoker   . Smokeless tobacco: Never Used  . Alcohol Use: No  . Drug Use: No  . Sexual Activity: No   Other Topics Concern  . Not on file   Social History Narrative   Widowed   Has children  and grandchildren   Lives in [place]    Past Surgical History  Procedure Laterality Date  . Vesicovaginal fistula closure w/ tah  1996  . Tubal ligation  1968  . Cholecystectomy  1999  . Cardiac catheterization  11/2012    [facility];EF 45-50%     Prescriptions prior to admission  Medication Sig Dispense Refill Last Dose  . COMBIVENT RESPIMAT 20-100 MCG/ACT AERS respimat 1 puff every 6 (six) hours as needed.    prn at prn  . fluticasone (FLONASE) 50 MCG/ACT nasal spray Place 2 sprays into both nostrils  daily.   unknown at unknown  . furosemide (LASIX) 20 MG tablet Take 40 mg by mouth daily.   unknown at unknown  . HYDROcodone-acetaminophen (NORCO) 10-325 MG tablet Take 1 tablet by mouth every 6 (six) hours as needed. 120 tablet 0 prn at prn  . insulin glargine (LANTUS) 100 UNIT/ML injection Inject 22 Units into the skin at bedtime.   unknown at unknown  . insulin lispro (HUMALOG) 100 UNIT/ML KiwkPen Inject up to 10 units three times daily before each meal as directed   unknown at unknown  . levothyroxine (SYNTHROID, LEVOTHROID) 100 MCG tablet Take 100 mcg by mouth daily before breakfast. Reported on 05/20/2015   unknown at unknown  . lisinopril (PRINIVIL,ZESTRIL) 2.5 MG tablet Take 2.5 mg by mouth every evening.   unknown at unknown  . LORazepam (ATIVAN) 1 MG tablet Take 1 mg by mouth 2 (two) times daily as needed for anxiety.   unknown at unknown  . metoprolol succinate (TOPROL-XL) 25 MG 24 hr tablet Take 1 tablet (25 mg total) by mouth 2 (two) times daily. 180 tablet 3 Past Week at Unknown time  . nitroGLYCERIN (NITROSTAT) 0.4 MG SL tablet Place 1 tablet (0.4 mg total) under the tongue every 5 (five) minutes as needed. May repeat for up to 3 doses. 90 tablet 3 prn at prn  . phenazopyridine (PYRIDIUM) 100 MG tablet Take 100 mg by mouth as needed. Reported on 01/21/2015   prn at prn  . rOPINIRole (REQUIP) 1 MG tablet Take 1 mg by mouth at bedtime.   unknown at unknown  . warfarin (COUMADIN) 3 MG tablet Take 3 mg by mouth daily.   unknown at unknown  . ZETIA 10 MG tablet Take 1 tablet by mouth Daily.   unknown at unknown  . ACCU-CHEK AVIVA PLUS test strip    Taking    Physical Exam: Blood pressure 115/81, pulse 83, temperature 98.8 F (37.1 C), temperature source Oral, resp. rate 20, height 5\' 5"  (1.651 m), weight 227 lb 3.2 oz (103.057 kg), SpO2 92 %.  Constitutional:  oriented to person, place, and time. He appears well-developed and well-nourished. No distress.  HENT: No nasal discharge.    Head: Normocephalic and atraumatic.  Eyes: Pupils are equal and round.  No discharge. Neck: Normal range of motion. Neck supple. No JVD present. No thyromegaly present.  Cardiovascular:  Irregularly irregular, normal heart sounds. Exam reveals no gallop and no friction rub. No murmur heard.  Pulmonary/Chest: Effort normal and breath sounds normal. No stridor. No respiratory distress.  no wheezes or rales.   Abdominal: Soft. Bowel sounds are normal. He exhibits no distension. There is no tenderness. There is no rebound and no guarding.  Musculoskeletal: Normal range of motion. Mild bilateral leg edema edema and no tenderness.  Neurological: Alert and oriented to person, place, and time. Coordination normal.  Skin: Skin is warm and dry. No rash noted. He  is not diaphoretic. No erythema. No pallor.  Psychiatric: Normal mood and affect.  behavior is normal. Judgment and thought content normal.     Labs:   Lab Results  Component Value Date   WBC 10.2 05/21/2015   HGB 13.2 05/21/2015   HCT 38.9 05/21/2015   MCV 87.8 05/21/2015   PLT 231 05/21/2015    Recent Labs Lab 05/21/15 0142  NA 137  K 4.0  CL 102  CO2 29  BUN 12  CREATININE 0.77  CALCIUM 8.7*  GLUCOSE 188*   Lab Results  Component Value Date   TROPONINI <0.03 05/21/2015      Radiology:  Chest x-ray was personally reviewed by me which showed no acute abnormalities. EKG: Personally reviewed by me and showed atrial fibrillation with controlled ventricular rate. Left bundle branch block which is chronic  ASSESSMENT AND PLAN:     1. Atypical chest pain: Multiple risk factors for coronary artery disease. Previous cardiac catheterization in 2014 showed no evidence of obstructive coronary artery disease. She has chronic left bundle branch block. I recommend evaluation with a pharmacologic nuclear stress test today. If that's low risk, the patient can be discharged home from a cardiac standpoint. There might be a strong  component of anxiety.  2. Chronic atrial fibrillation: Continue rate control with metoprolol and long-term anticoagulation.  3. Chronic systolic heart failure: Previously mildly reduced LV systolic function. She appears to be euvolemic on current dose of furosemide.  Signed: Kathlyn Sacramento MD, [facility] 05/21/2015, 8:07 AM

## 2015-05-21 NOTE — Telephone Encounter (Signed)
Pt is being discharge from the hospital today. Hospital called to make hospital follow up appointment for 05/31/15. Call for transition into care.

## 2015-05-21 NOTE — Consult Note (Signed)
ANTICOAGULATION CONSULT NOTE - Initial Consult  Pharmacy Consult for warfarin Indication: atrial fibrillation  Allergies  Allergen Reactions  . Clarithromycin     Hives, headaches, hard time swelling, felt like throat was closing up  . Diphenhydramine   . Estrogens Conjugated   . Sulfonamide Derivatives Swelling    'tongue swelling'    Patient Measurements: Height: 5\' 5"  (165.1 cm) Weight: 227 lb 3.2 oz (103.057 kg) IBW/kg (Calculated) : 57 Heparin Dosing Weight:   Vital Signs: Temp: 97.8 F (36.6 C) (04/21 1126) Temp Source: Oral (04/21 1126) BP: 121/56 mmHg (04/21 1126) Pulse Rate: 50 (04/21 1126)  Labs:  Recent Labs  05/20/15 1455 05/20/15 1947 05/20/15 2207 05/21/15 0142  HGB 13.2  --  12.9 13.2  HCT 39.2  --  38.8 38.9  PLT 212  --  210 231  LABPROT 31.9*  --   --  31.3*  INR 3.17  --   --  3.09  CREATININE 0.74  --  0.83 0.77  TROPONINI <0.03 <0.03 <0.03 <0.03    Estimated Creatinine Clearance: 67.9 mL/min (by C-G formula based on Cr of 0.77).   Medical History: Past Medical History  Diagnosis Date  . Chronic atrial fibrillation (HCC)     a. CHA2DS2VASc = 6--> warfarin.  . Chronic combined systolic and diastolic CHF (congestive heart failure) ([place])     a. 10/2012 Echo: EF 35-40%, diff HK, mild MR, mild biatrial enlargement;  b. 11/2012 EF 45-50% by LV gram; c. echo 07/2014: EF 40-45%, mod conc LVH, mod MR, severe biatrial enlargement, PASP 45 mm Hg  . Chronic anticoagulation     a. warfarin.  . Acute myocardial infarction of inferior wall ([place])     a. 10/2005 - Presumed to be 2/2 to a coronary embolus in setting of persistent Afib-->Cath relatively nl cors, EF 45-50% w/ severe distal inferior/apical inferior HK w/ aneursymal appearance.  . Essential hypertension   . Obesity   . Hyperlipidemia, mixed   . Type II diabetes mellitus ([place])   . Mixed Ischemic/Nonischemic Cardiomyopathy     a. 10/2012 Echo: EF 35-40%;  b. 11/2012 EF 45-50% by LV gram.     Medications:  Scheduled:  . aspirin EC  325 mg Oral Daily  . ezetimibe  10 mg Oral Daily  . fluticasone  2 spray Each Nare Daily  . furosemide  40 mg Oral Daily  . insulin aspart  0-9 Units Subcutaneous TID WC  . insulin glargine  22 Units Subcutaneous QHS  . levothyroxine  100 mcg Oral QAC breakfast  . lisinopril  2.5 mg Oral QPM  . metoprolol succinate  25 mg Oral BID  . rOPINIRole  1 mg Oral QHS  . Warfarin - Pharmacist Dosing Inpatient   Does not apply q1800    Assessment: Pt is a 82 year old female with a PMH of afib on warfarin 3mg  daily at home. Pt presents with chest pain. INR on admission is slighly supratherapeutic at 3.1  4/20 INR: 3.17, warfarin 1.5mg  4/21 INR: 3.09  Goal of Therapy:  INR 2-3 Monitor platelets by anticoagulation protocol: Yes   Plan:  Will give 1.5mg  tonight at follow up with INR in the AM.  Paulina Fusi, PharmD, BCPS 05/21/2015 11:59 AM

## 2015-05-21 NOTE — Progress Notes (Signed)
Per Dr. Fletcher Anon, stress test was normal. Dr Leslye Peer notified. Proceed with discharge.

## 2015-05-21 NOTE — Progress Notes (Signed)
Patient ID: Tracy Mann, female   DOB: July 19, 1933, 82 y.o.   MRN: [id number] [facility] PROGRESS NOTE  Tracy Mann [id number] DOB: Nov 01, 1933 DOA: 05/20/2015 PCP: Tracy Durie, MD  HPI/Subjective: Patient feeling better now. Offers no complaints. Interested in going home if stress test is negative.  Objective: Filed Vitals:   05/21/15 0333 05/21/15 1126  BP: 115/81 121/56  Pulse: 83 50  Temp: 98.8 F (37.1 C) 97.8 F (36.6 C)  Resp: 20 18    Filed Weights   05/20/15 1452 05/20/15 2010  Weight: 103.42 kg (228 lb) 103.057 kg (227 lb 3.2 oz)    ROS: Review of Systems  Constitutional: Negative for fever and chills.  Eyes: Negative for blurred vision.  Respiratory: Negative for cough and shortness of breath.   Cardiovascular: Positive for chest pain.  Gastrointestinal: Negative for nausea, vomiting, diarrhea and constipation.  Genitourinary: Negative for dysuria.  Musculoskeletal: Negative for joint pain.  Neurological: Negative for dizziness and headaches.   Exam: Physical Exam  Constitutional: She is oriented to person, place, and time.  HENT:  Nose: No mucosal edema.  Mouth/Throat: No oropharyngeal exudate or posterior oropharyngeal edema.  Eyes: Conjunctivae, EOM and lids are normal. Pupils are equal, round, and reactive to light.  Neck: No JVD present. Carotid bruit is not present. No edema present. No thyroid mass and no thyromegaly present.  Cardiovascular: S1 normal and S2 normal.  Exam reveals no gallop.   No murmur heard. Pulses:      Dorsalis pedis pulses are 2+ on the right side, and 2+ on the left side.  Respiratory: No respiratory distress. She has no wheezes. She has no rhonchi. She has no rales.  GI: Soft. Bowel sounds are normal. There is no tenderness.  Musculoskeletal:       Right ankle: She exhibits swelling.       Left ankle: She exhibits swelling.  Lymphadenopathy:    She has no cervical adenopathy.  Neurological: She is alert  and oriented to person, place, and time. No cranial nerve deficit.  Skin: Skin is warm. No rash noted. Nails show no clubbing.  Psychiatric: She has a normal mood and affect.      Data Reviewed: Basic Metabolic Panel:  Recent Labs Lab 05/20/15 1455 05/20/15 2207 05/21/15 0142  NA 134*  --  137  K 4.3  --  4.0  CL 101  --  102  CO2 27  --  29  GLUCOSE 239*  --  188*  BUN 14  --  12  CREATININE 0.74 0.83 0.77  CALCIUM 8.9  --  8.7*   CBC:  Recent Labs Lab 05/20/15 1455 05/20/15 2207 05/21/15 0142  WBC 8.7 8.3 10.2  HGB 13.2 12.9 13.2  HCT 39.2 38.8 38.9  MCV 88.5 89.6 87.8  PLT 212 210 231   Cardiac Enzymes:  Recent Labs Lab 05/20/15 1455 05/20/15 1947 05/20/15 2207 05/21/15 0142  TROPONINI <0.03 <0.03 <0.03 <0.03   BNP (last 3 results)  CBG:  Recent Labs Lab 05/20/15 2016 05/21/15 0734  GLUCAP 146* 193*    Studies: Dg Chest 2 View  05/20/2015  CLINICAL DATA:  Cardiac shadow is mildly enlarged. EXAM: CHEST  2 VIEW COMPARISON:  08/21/2014 FINDINGS: Cardiac shadow is mildly enlarged but stable. The lungs are again hyperinflated. No focal infiltrate or sizable effusion is seen. No bony abnormality is noted. IMPRESSION: No acute abnormality noted. Electronically Signed   By: Inez Catalina M.D.   On: 05/20/2015  15:46    Scheduled Meds: . aspirin EC  81 mg Oral Daily  . ezetimibe  10 mg Oral Daily  . fluticasone  2 spray Each Nare Daily  . furosemide  40 mg Oral Daily  . insulin aspart  0-9 Units Subcutaneous TID WC  . insulin glargine  22 Units Subcutaneous QHS  . levothyroxine  100 mcg Oral QAC breakfast  . lisinopril  2.5 mg Oral QPM  . metoprolol succinate  25 mg Oral BID  . rOPINIRole  1 mg Oral QHS  . warfarin  1.5 mg Oral ONCE-1800  . Warfarin - Pharmacist Dosing Inpatient   Does not apply q1800    Assessment/Plan:  1. Chest pain. 3 cardiac enzymes negative. Stress test currently pending. We'll send home if stress test is negative. I wrote  a prescription for new nitroglycerin. Her nitroglycerin at home is old. 2. Atrial fibrillation. Anticoagulated with Coumadin. 3. Type 2 diabetes mellitus continue Lantus insulin and sliding scale 4. Essential hypertension continue home medications 5. Hypothyroidism unspecified continue levothyroxine 6. CHF listed in history but no signs.  Code Status:     Code Status Orders        Start     Ordered   05/20/15 2011  Full code   Continuous     05/20/15 2010    Code Status History    Date Active Date Inactive Code Status Order ID Comments User Context   This patient has a current code status but no historical code status.     Disposition Plan: Home if stress test negative  Consultants:  Cardiology  Time spent: 35 minutes  [place], [place]

## 2015-05-21 NOTE — Telephone Encounter (Signed)
Attempted to contact pt regarding discharge from [facility] on 05/21/15. Left message asking pt to call back regarding discharge instructions and/or medications. Advised pt of appt w/ Christell Faith, PA on 06/07/15 at 1:30 w/ [facility]. Asked pt to call back if unable to keep this appt.

## 2015-05-24 NOTE — Telephone Encounter (Signed)
Tried calling x 2 line busy-aa

## 2015-05-25 NOTE — Telephone Encounter (Signed)
[id number] 

## 2015-05-25 NOTE — Telephone Encounter (Signed)
Spoke with patient, she was seen at the hospital for chest pain, nausea, pain in left arm, dizziness. Had different tests done and was told maybe she had an esophageal spasms. She has follow up with Dr. Rockey Situ also. Her symptoms were similar to the time she had heart attack about 10 years ago. Patient states she is feeling better and has not had anymore of those symptoms since then-aa

## 2015-05-26 ENCOUNTER — Ambulatory Visit (INDEPENDENT_AMBULATORY_CARE_PROVIDER_SITE_OTHER): Payer: Medicare Other

## 2015-05-26 DIAGNOSIS — I482 Chronic atrial fibrillation, unspecified: Secondary | ICD-10-CM

## 2015-05-26 LAB — POCT INR
INR: 3.9
PT: 46.3

## 2015-05-26 NOTE — Patient Instructions (Signed)
Anticoagulation Dose Instructions as of 05/26/2015      Dorene Grebe Tue Wed Thu Fri Sat   New Dose 3 mg 3 mg 3 mg 3 mg 3 mg 3 mg 3 mg    Description        Hold for 1 day then 3 mg daily, f/u 1 week

## 2015-05-31 ENCOUNTER — Ambulatory Visit (INDEPENDENT_AMBULATORY_CARE_PROVIDER_SITE_OTHER): Payer: Medicare Other | Admitting: Family Medicine

## 2015-05-31 VITALS — BP 100/60 | HR 76 | Temp 98.1°F | Resp 16 | Wt 231.0 lb

## 2015-05-31 DIAGNOSIS — I482 Chronic atrial fibrillation, unspecified: Secondary | ICD-10-CM

## 2015-05-31 DIAGNOSIS — Z09 Encounter for follow-up examination after completed treatment for conditions other than malignant neoplasm: Secondary | ICD-10-CM

## 2015-05-31 DIAGNOSIS — L570 Actinic keratosis: Secondary | ICD-10-CM | POA: Diagnosis not present

## 2015-05-31 LAB — POCT INR
INR: 2.9
PT: 34.8

## 2015-05-31 NOTE — Patient Instructions (Signed)
Anticoagulation Dose Instructions as of 05/31/2015      Tracy Mann Tue Wed Thu Fri Sat   New Dose 3 mg 3 mg 3 mg 3 mg 3 mg 3 mg 3 mg    Description        3 mg daily, follow up in 3 weeks

## 2015-05-31 NOTE — Progress Notes (Signed)
Patient ID: Tracy Mann, female   DOB: 1933/10/07, 82 y.o.   MRN: [id number]   Tracy Mann  MRN: [id number] DOB: [birth year]  Subjective:  HPI  Transition care visit. She actually feels well and no reccurrence of angina noted. The patient is an 83 year old female who presents for follow up after being hospitalized for angina at rest.  The patient was admitted to [facility] on 05/20/15 and discharged on 05/21/15.  The patient was told to take an Aspirin daily but she states that it bothers her stomach and she is unable to continue taking it.  Patient is on 3 mg daily of Coumadin.  Her INR was checked today and it was 2.9 after being 3.9 on 05/26/15.  She has been instructed to continue with 3 mg daily and follow up in 3 weeks.    Patient Active Problem List   Diagnosis Date Noted  . Angina at rest [facility]) 05/20/2015  . Weakness 03/03/2015  . Mixed Ischemic/Nonischemic Cardiomyopathy   . Type II diabetes mellitus ([place])   . Chronic atrial fibrillation ([place])   . Chronic combined systolic and diastolic CHF (congestive heart failure) ([place])   . Chronic anticoagulation   . Absolute anemia 07/08/2014  . Airway hyperreactivity 07/08/2014  . Cervical muscle strain 07/08/2014  . PNA (pneumonia) 07/08/2014  . Chest pressure 07/08/2014  . Clinical depression 07/08/2014  . Degeneration of lumbar or lumbosacral intervertebral disc 07/08/2014  . Accumulation of fluid in tissues 07/08/2014  . Anxiety, generalized 07/08/2014  . Acid reflux 07/08/2014  . Folliculitis 99991111  . Adult hypothyroidism 07/08/2014  . Broken leg 07/08/2014  . Cannot sleep 07/08/2014  . Obstructive sleep apnea 07/08/2014  . Arthritis, degenerative 07/08/2014  . Onychia of finger 07/08/2014  . Psoriasis 07/08/2014  . Restless legs syndrome 07/08/2014  . Adult BMI 30+ 07/08/2014  . Calculus of kidney 10/31/2011  . Bladder infection, chronic 10/31/2011  . Incomplete bladder emptying 10/31/2011  . Obesity 11/02/2010  .  SHORTNESS OF BREATH 02/01/2010  . Hyperlipidemia 09/16/2008  . HYPERTENSION, HEART CONTROLLED W/O ASSOC CHF 09/16/2008  . CAD, NATIVE VESSEL 09/16/2008  . Atrial fibrillation ([place]) 09/16/2008    Past Medical History  Diagnosis Date  . Chronic atrial fibrillation (HCC)     a. CHA2DS2VASc = 6--> warfarin.  . Chronic combined systolic and diastolic CHF (congestive heart failure) ([place])     a. 10/2012 Echo: EF 35-40%, diff HK, mild MR, mild biatrial enlargement;  b. 11/2012 EF 45-50% by LV gram; c. echo 07/2014: EF 40-45%, mod conc LVH, mod MR, severe biatrial enlargement, PASP 45 mm Hg  . Chronic anticoagulation     a. warfarin.  . Acute myocardial infarction of inferior wall ([place])     a. 10/2005 - Presumed to be 2/2 to a coronary embolus in setting of persistent Afib-->Cath relatively nl cors, EF 45-50% w/ severe distal inferior/apical inferior HK w/ aneursymal appearance.  . Essential hypertension   . Obesity   . Hyperlipidemia, mixed   . Type II diabetes mellitus ([place])   . Mixed Ischemic/Nonischemic Cardiomyopathy     a. 10/2012 Echo: EF 35-40%;  b. 11/2012 EF 45-50% by LV gram.    Social History   Social History  . Marital Status: Widowed    Spouse Name: widowed  . Number of Children: 4  . Years of Education: 14   Occupational History  . retired    Social History Main Topics  . Smoking status: Never Smoker   .  Smokeless tobacco: Never Used  . Alcohol Use: No  . Drug Use: No  . Sexual Activity: No   Other Topics Concern  . Not on file   Social History Narrative   Widowed   Has children and grandchildren   Lives in [place]    Outpatient Prescriptions Prior to Visit  Medication Sig Dispense Refill  . ACCU-CHEK AVIVA PLUS test strip     . COMBIVENT RESPIMAT 20-100 MCG/ACT AERS respimat 1 puff every 6 (six) hours as needed.     . fluticasone (FLONASE) 50 MCG/ACT nasal spray Place 2 sprays into both nostrils daily.    . furosemide (LASIX) 20 MG tablet Take 40 mg  by mouth daily.    Marland Kitchen HYDROcodone-acetaminophen (NORCO) 10-325 MG tablet Take 1 tablet by mouth every 6 (six) hours as needed. 120 tablet 0  . insulin glargine (LANTUS) 100 UNIT/ML injection Inject 22 Units into the skin at bedtime.    . insulin lispro (HUMALOG) 100 UNIT/ML KiwkPen Inject up to 10 units three times daily before each meal as directed    . levothyroxine (SYNTHROID, LEVOTHROID) 100 MCG tablet Take 100 mcg by mouth daily before breakfast. Reported on 05/20/2015    . lisinopril (PRINIVIL,ZESTRIL) 2.5 MG tablet Take 2.5 mg by mouth every evening.    Marland Kitchen LORazepam (ATIVAN) 1 MG tablet Take 1 mg by mouth 2 (two) times daily as needed for anxiety.    . metoprolol succinate (TOPROL-XL) 25 MG 24 hr tablet Take 1 tablet (25 mg total) by mouth 2 (two) times daily. 180 tablet 3  . nitroGLYCERIN (NITROSTAT) 0.4 MG SL tablet Place 1 tablet (0.4 mg total) under the tongue every 5 (five) minutes as needed. May repeat for up to 3 doses. 30 tablet 0  . phenazopyridine (PYRIDIUM) 100 MG tablet Take 100 mg by mouth as needed. Reported on 01/21/2015    . rOPINIRole (REQUIP) 1 MG tablet Take 1 mg by mouth at bedtime.    Marland Kitchen warfarin (COUMADIN) 3 MG tablet Take 3 mg by mouth daily.    Marland Kitchen ZETIA 10 MG tablet Take 1 tablet by mouth Daily.    Marland Kitchen aspirin EC 81 MG EC tablet Take 1 tablet (81 mg total) by mouth daily. (Patient not taking: Reported on 05/31/2015) 30 tablet 2   No facility-administered medications prior to visit.    Allergies  Allergen Reactions  . Clarithromycin     Hives, headaches, hard time swelling, felt like throat was closing up  . Diphenhydramine   . Estrogens Conjugated   . Sulfonamide Derivatives Swelling    'tongue swelling'    Review of Systems  Constitutional: Negative for fever and malaise/fatigue.  Eyes: Negative.   Respiratory: Positive for cough and wheezing. Negative for shortness of breath.   Cardiovascular: Positive for palpitations, orthopnea (chronic, unchanged, she states  she must sleep on pillows) and leg swelling. Negative for chest pain and claudication.  Neurological: Negative for dizziness, weakness and headaches.  Endo/Heme/Allergies: Negative.   Psychiatric/Behavioral: Negative.    Objective:  BP 100/60 mmHg  Pulse 76  Temp(Src) 98.1 F (36.7 C) (Oral)  Resp 16  Wt 231 lb (104.781 kg)  Physical Exam  Constitutional: She is oriented to person, place, and time and well-developed, well-nourished, and in no distress.  HENT:  Head: Normocephalic and atraumatic.  Right Ear: External ear normal.  Left Ear: External ear normal.  Nose: Nose normal.  Eyes: Pupils are equal, round, and reactive to light.  Neck: Normal range of motion.  Cardiovascular: Normal rate, regular rhythm and normal heart sounds.   Pulmonary/Chest: Effort normal and breath sounds normal.  Abdominal: Soft.  Neurological: She is alert and oriented to person, place, and time. Gait normal.  Skin: Skin is warm and dry.  Numerous actinic keratosis  Psychiatric: Mood, memory, affect and judgment normal.    Assessment and Plan :   1. Hospital discharge follow-up   2. Chronic atrial fibrillation (HCC) Patient to continue 3 mg daily and follow up in 3 weeks.  - POCT INR  3. Actinic keratosis Patient has numerous actinic keratosis.  Will refer to derm.  - Ambulatory referral to Dermatology 4. CAD/unstable angina Angina resolved. CAD appears to be stable. Followed by cardiology. She is advised to try to take aspirin every other day and if unable tolerate that maybe twice a week.   I have done the exam and reviewed the above chart and it is accurate to the best of my knowledge.  Miguel Aschoff MD [facility] 05/31/2015 3:11 PM

## 2015-06-07 ENCOUNTER — Encounter: Payer: Self-pay | Admitting: Cardiovascular Disease

## 2015-06-07 ENCOUNTER — Ambulatory Visit (INDEPENDENT_AMBULATORY_CARE_PROVIDER_SITE_OTHER): Payer: Medicare Other | Admitting: Cardiovascular Disease

## 2015-06-07 ENCOUNTER — Ambulatory Visit: Payer: Medicare Other | Admitting: Physician Assistant

## 2015-06-07 VITALS — BP 120/64 | HR 90 | Ht 65.0 in | Wt 229.5 lb

## 2015-06-07 DIAGNOSIS — I482 Chronic atrial fibrillation, unspecified: Secondary | ICD-10-CM

## 2015-06-07 DIAGNOSIS — I209 Angina pectoris, unspecified: Secondary | ICD-10-CM

## 2015-06-07 DIAGNOSIS — I119 Hypertensive heart disease without heart failure: Secondary | ICD-10-CM

## 2015-06-07 DIAGNOSIS — I2510 Atherosclerotic heart disease of native coronary artery without angina pectoris: Secondary | ICD-10-CM | POA: Diagnosis not present

## 2015-06-07 DIAGNOSIS — E785 Hyperlipidemia, unspecified: Secondary | ICD-10-CM

## 2015-06-07 NOTE — Assessment & Plan Note (Signed)
Recent episode of chest pain,  Stress test in the hospital showing no ischemia  No further testing at this time

## 2015-06-07 NOTE — Patient Instructions (Signed)
You are doing well. No medication changes were made.  Please call us if you have new issues that need to be addressed before your next appt.  Your physician wants you to follow-up in: 6 months.  You will receive a reminder letter in the mail two months in advance. If you don't receive a letter, please call our office to schedule the follow-up appointment.   

## 2015-06-07 NOTE — Assessment & Plan Note (Signed)
Tolerating  Anticoagulation, discussion concerning other options such as xarelto  And eliquis.  Unclear if she would be a candidate for this given her significant mitral valve disease

## 2015-06-07 NOTE — Assessment & Plan Note (Signed)
Unable to tolerate statins  Currently taking Zetia.  Numbers not at goal   Total encounter time more than 25 minutes  Greater than 50% was spent in counseling and coordination of care with the patient

## 2015-06-07 NOTE — Assessment & Plan Note (Signed)
Recent chest pain on the left, admission to the hospital, rule out, stress test as detailed  Records reviewed.  Long discussion concerning various treatment options. Suggested if she has additional episodes of chest pain requiring nitroglycerin that she call our office. She may need further workup possible possibly cardiac catheterization

## 2015-06-07 NOTE — Assessment & Plan Note (Signed)
Blood pressure is well controlled on today's visit. No changes made to the medications. 

## 2015-06-07 NOTE — Progress Notes (Signed)
Patient ID: Tracy Mann, female    DOB: 03-11-1933, 82 y.o.   MRN: [id number]  HPI Comments: Tracy Mann is a 82 year old woman with a PMH significant for chronic atrial fibrillation on Coumadin therapy, diastolic dysfunction, hypertension, hyperlipidemia, morbid obesity, and history of an  inferior wall myocardial infarction that was felt to be secondary to a thrombus from atrial fib. in October 2007 at [facility], previous episodes of chest pain who presents for followup  Of her coronary artery disease and leg edema. Previous echocardiogram with moderate pulmonary hypertension  In follow-up today, she reports that she had an episode of chest pain 05/21/2015 on the left side She went to the emergency room, was admitted. Hospital records reviewed. Cardiac enzymes were unrevealing, EKG unchanged. She had stress Myoview showing old inferior MI with no ischemia consistent with her known anatomy. She did not take nitroglycerin for her symptoms.  Since discharge she's had no further episodes, feels well. She takes Lasix periodically when she feels her ankles are swollen Previously was taking Lasix twice a day, then daily, now missing some doses She does not like to wear compression hose secondary to rash and pinching below the knee Legs are weak, no regular exercise program Previously had symptoms of fatigue, leg weakness, overall deconditioning  EKG on today's visit shows atrial fibrillation with ventricular rate 91 bpm, intraventricular conduction delay, left anterior fascicular block  Other past medical history  Previously seen by vein and vascular, Dr. Ronalee Belts, recommended to wear TED hose or ACE wraps It was mentioned that perhaps she could wear lymphedema compression pump. She does not have this yet She does sit down during the daytime with her legs down, unable to get her legs up very high Difficulty touching her toes, unable to put compression hose on  diabetes,  currently on insulin. Followed by Dr. Blossom Hoops. Previous echocardiogram showing ejection fraction 35-40% in 2014 Follow-up cardiac catheterization showing ejection fraction 45-50%  Continued chronic knee problems, difficulty walking long distances  Other past medical history On a previous visit, she had worsening shortness of breath, poor energy.  Echocardiogram was done that showed moderately depressed ejection fraction of 35%. Previously she had been 50%. She felt poorly  with shortness of breath and fatigue with any exertion.   Cardiac catheterization was performed to rule out ischemia as a cause of her depressed ejection fraction. This was done on 12/05/2012. She had a right dominant coronary arterial system with mild luminal irregularities, no significant stenoses that could contribute to her depressed ejection fraction. EF was estimated at 45%. There was an aneurysmal/severely hypokinetic region in the distal inferior and apical inferior wall from old MI 2007.   Previous  echocardiogram several years ago showed ejection fraction 50%. Moderately elevated right ventricular systolic pressures. Previous 48-hour Holter monitor, which showed that she was in persistent atrial fibrillation with one episode of rapid ventricular response.      total cholesterol 198, triglycerides 300, LDL 104      Allergies  Allergen Reactions  . Clarithromycin     Hives, headaches, hard time swelling, felt like throat was closing up  . Diphenhydramine   . Estrogens Conjugated   . Sulfonamide Derivatives Swelling    'tongue swelling'    Outpatient Encounter Prescriptions as of 06/07/2078  Medication Sig  . ACCU-CHEK AVIVA PLUS test strip   . COMBIVENT RESPIMAT 20-100 MCG/ACT AERS respimat 1 puff every 6 (six) hours as needed.   . fluticasone (FLONASE) 50  MCG/ACT nasal spray Place 2 sprays into both nostrils daily.  . furosemide (LASIX) 20 MG tablet Take 20 mg by mouth 2 (two) times daily as needed.  Marland Kitchen  HYDROcodone-acetaminophen (NORCO) 10-325 MG tablet Take 1 tablet by mouth every 6 (six) hours as needed.  . insulin glargine (LANTUS) 100 UNIT/ML injection Inject 22 Units into the skin at bedtime.  . insulin lispro (HUMALOG) 100 UNIT/ML KiwkPen Inject up to 10 units three times daily before each meal as directed  . levothyroxine (SYNTHROID, LEVOTHROID) 100 MCG tablet Take 100 mcg by mouth daily before breakfast. Reported on 05/20/2078  . lisinopril (PRINIVIL,ZESTRIL) 2.5 MG tablet Take 2.5 mg by mouth every evening.  Marland Kitchen LORazepam (ATIVAN) 1 MG tablet Take 1 mg by mouth 2 (two) times daily as needed for anxiety.  . metoprolol succinate (TOPROL-XL) 25 MG 24 hr tablet Take 1 tablet (25 mg total) by mouth 2 (two) times daily.  . nitroGLYCERIN (NITROSTAT) 0.4 MG SL tablet Place 1 tablet (0.4 mg total) under the tongue every 5 (five) minutes as needed. May repeat for up to 3 doses.  . phenazopyridine (PYRIDIUM) 100 MG tablet Take 100 mg by mouth as needed. Reported on 01/21/2079  . rOPINIRole (REQUIP) 1 MG tablet Take 1 mg by mouth at bedtime.  Marland Kitchen warfarin (COUMADIN) 3 MG tablet Take 3 mg by mouth daily.  Marland Kitchen ZETIA 10 MG tablet Take 1 tablet by mouth Daily.  . [DISCONTINUED] aspirin EC 81 MG EC tablet Take 1 tablet (81 mg total) by mouth daily. (Patient not taking: Reported on 06/07/2015)   No facility-administered encounter medications on file as of 06/07/2078.    Past Medical History  Diagnosis Date  . Chronic atrial fibrillation (HCC)     a. CHA2DS2VASc = 6--> warfarin.  . Chronic combined systolic and diastolic CHF (congestive heart failure) ([place])     a. 10/2012 Echo: EF 35-40%, diff HK, mild MR, mild biatrial enlargement;  b. 11/2012 EF 45-50% by LV gram; c. echo 07/2014: EF 40-45%, mod conc LVH, mod MR, severe biatrial enlargement, PASP 45 mm Hg  . Chronic anticoagulation     a. warfarin.  . Acute myocardial infarction of inferior wall ([employer])     a. 10/2005 - Presumed to be 2/2 to a coronary embolus  in setting of persistent Afib-->Cath relatively nl cors, EF 45-50% w/ severe distal inferior/apical inferior HK w/ aneursymal appearance.  . Essential hypertension   . Obesity   . Hyperlipidemia, mixed   . Type II diabetes mellitus ([place])   . Mixed Ischemic/Nonischemic Cardiomyopathy     a. 10/2012 Echo: EF 35-40%;  b. 11/2012 EF 45-50% by LV gram.    Past Surgical History  Procedure Laterality Date  . Vesicovaginal fistula closure w/ tah  1996  . Tubal ligation  1968  . Cholecystectomy  1999  . Cardiac catheterization  11/2012    [facility];EF 45-50%    Social History  reports that she has never smoked. She has never used smokeless tobacco. She reports that she does not drink alcohol or use illicit drugs.  Family History family history includes Heart disease in her mother; Thyroid disease in her father.  Review of Systems  Constitutional: Positive for fatigue.  Respiratory: Negative.   Cardiovascular: Positive for chest pain and leg swelling.  Gastrointestinal: Negative.   Musculoskeletal: Positive for joint swelling, arthralgias and gait problem.  Skin: Negative.   Neurological: Positive for weakness.  Hematological: Negative.   Psychiatric/Behavioral: Negative.   All other systems reviewed  and are negative.  BP 120/64 mmHg  Pulse 90  Ht 5\' 5"  (1.651 m)  Wt 229 lb 8 oz (104.101 kg)  BMI 38.19 kg/m2  Physical Exam  Constitutional: She is oriented to person, place, and time. She appears well-developed and well-nourished.  Obese.  HENT:  Head: Normocephalic.  Nose: Nose normal.  Mouth/Throat: Oropharynx is clear and moist.  Eyes: Conjunctivae are normal. Pupils are equal, round, and reactive to light.  Neck: Normal range of motion. Neck supple. No JVD present.  Cardiovascular: Normal rate, S1 normal, S2 normal and intact distal pulses.  An irregularly irregular rhythm present. Exam reveals no gallop and no friction rub.   Murmur heard.  Crescendo systolic murmur is  present with a grade of 2/6  Edema is nonpitting. She has varicies.  Pulmonary/Chest: Effort normal and breath sounds normal. No respiratory distress. She has no wheezes. She has no rales. She exhibits no tenderness.  Abdominal: Soft. Bowel sounds are normal. She exhibits no distension. There is no tenderness.  Musculoskeletal: Normal range of motion. She exhibits no edema or tenderness.  Lymphadenopathy:    She has no cervical adenopathy.  Neurological: She is alert and oriented to person, place, and time. Coordination normal.  Skin: Skin is warm and dry. No rash noted. No erythema.  Psychiatric: She has a normal mood and affect. Her behavior is normal. Judgment and thought content normal.    Assessment and Plan  Nursing note and vitals reviewed.

## 2015-06-09 ENCOUNTER — Other Ambulatory Visit: Payer: Self-pay

## 2015-06-09 ENCOUNTER — Other Ambulatory Visit: Payer: Self-pay | Admitting: Family Medicine

## 2015-06-09 MED ORDER — EZETIMIBE 10 MG PO TABS: 10.0000 mg | ORAL_TABLET | Freq: Every day | ORAL | Status: DC

## 2015-06-09 MED ORDER — METOPROLOL SUCCINATE ER 25 MG PO TB24: 25.0000 mg | ORAL_TABLET | Freq: Two times a day (BID) | ORAL | Status: DC

## 2015-06-09 NOTE — Telephone Encounter (Signed)
90 day supply

## 2015-06-09 NOTE — Telephone Encounter (Signed)
rx sent in-aa 

## 2015-06-09 NOTE — Telephone Encounter (Signed)
Pt needs refill ZETIA 10 MG tablet  [facility]. For this refill  ThanksTeri

## 2015-06-15 ENCOUNTER — Other Ambulatory Visit: Payer: Self-pay

## 2015-06-15 ENCOUNTER — Telehealth: Payer: Self-pay | Admitting: Family Medicine

## 2015-06-15 MED ORDER — CIPROFLOXACIN HCL 250 MG PO TABS: 250.0000 mg | ORAL_TABLET | Freq: Two times a day (BID) | ORAL | Status: DC

## 2015-06-15 NOTE — Telephone Encounter (Signed)
6 tablets Cipro 250. This is the only time I will call in antibiotics.

## 2015-06-15 NOTE — Telephone Encounter (Signed)
Pt called saying her cystitis is flared up and she use to see Dr. Jacqlyn Larsen.  She said Dr. Rosanna Randy said he would treat this for her.  She is going out of town and does not want to come in.   She wants to know if you will call in an antibiotic.  Last time you called in Cipro 6 tabs.  She said the ciprofloxacin hcl 250mg  works but she needs more than 6 tablets.  She usually takes bid.    Her call back is [phone]  Thanks, [name]

## 2015-06-15 NOTE — Telephone Encounter (Signed)
Please review-aa 

## 2015-06-17 ENCOUNTER — Telehealth: Payer: Self-pay | Admitting: Cardiovascular Disease

## 2015-06-17 NOTE — Telephone Encounter (Signed)
Patient c/o Palpitations:  High priority if patient c/o lightheadedness and shortness of breath  1. How long have you been having palpitations?  Last 4 days since starting cipro   2. Are you currently experiencing lightheadedness and shortness of breath?  Lightheaded slightly sob took nitro last night to sleep   3. Have you checked your BP and heart rate? (document readings)   Checked hr last night 50-60 's   Checked while on the phone and it is in the 70's - 80's   4. Are you experiencing any other symptoms?     Patient says she is taking cipro for bladder problems and this started when she started taking .  Patient when on computer and feels this is a side effect from cipro  Please call to let her know what to do .

## 2015-06-17 NOTE — Telephone Encounter (Signed)
Spoke w/ pt.  She reports that all of her sx began after she started Cipor and she is concerned that she may be having a reaction.  She asks if I will change her meds for her, as she thinks she might need an extra metoprolol. Advised her not to take any additional meds to avoid dropping her HR too low.   She verbalizes understanding and will call Dr. Alben Spittle office now.  Asked her to call back if we can be of further assistance.

## 2015-06-23 ENCOUNTER — Ambulatory Visit (INDEPENDENT_AMBULATORY_CARE_PROVIDER_SITE_OTHER): Payer: Medicare Other

## 2015-06-23 DIAGNOSIS — I482 Chronic atrial fibrillation, unspecified: Secondary | ICD-10-CM

## 2015-06-23 LAB — POCT INR
INR: 3.4
PT: 40.6

## 2015-06-23 NOTE — Patient Instructions (Signed)
Anticoagulation Dose Instructions as of 06/23/2015      Dorene Grebe Tue Wed Thu Fri Sat   New Dose 3 mg 3 mg 3 mg 3 mg 3 mg 3 mg 3 mg    Description        Hold 1 day then resume 3 mg daily, follow up in 2 weeks

## 2015-06-30 ENCOUNTER — Ambulatory Visit (INDEPENDENT_AMBULATORY_CARE_PROVIDER_SITE_OTHER): Payer: Medicare Other | Admitting: Family Medicine

## 2015-06-30 VITALS — BP 138/72 | HR 72 | Temp 98.3°F | Resp 16 | Wt 229.0 lb

## 2015-06-30 DIAGNOSIS — M150 Primary generalized (osteo)arthritis: Secondary | ICD-10-CM | POA: Diagnosis not present

## 2015-06-30 DIAGNOSIS — Z794 Long term (current) use of insulin: Secondary | ICD-10-CM

## 2015-06-30 DIAGNOSIS — N3010 Interstitial cystitis (chronic) without hematuria: Secondary | ICD-10-CM

## 2015-06-30 DIAGNOSIS — E119 Type 2 diabetes mellitus without complications: Secondary | ICD-10-CM

## 2015-06-30 DIAGNOSIS — R600 Localized edema: Secondary | ICD-10-CM | POA: Diagnosis not present

## 2015-06-30 DIAGNOSIS — I482 Chronic atrial fibrillation, unspecified: Secondary | ICD-10-CM

## 2015-06-30 DIAGNOSIS — M159 Polyosteoarthritis, unspecified: Secondary | ICD-10-CM

## 2015-06-30 LAB — POCT URINALYSIS DIPSTICK
Bilirubin, UA: NEGATIVE
Glucose, UA: NEGATIVE
KETONES UA: NEGATIVE
Nitrite, UA: NEGATIVE
PH UA: 7.5
SPEC GRAV UA: 1.02
UROBILINOGEN UA: 0.2

## 2015-06-30 LAB — POCT UA - MICROSCOPIC ONLY

## 2015-06-30 LAB — POCT INR
INR: 1.8
PT: 24.6

## 2015-06-30 MED ORDER — NITROFURANTOIN MONOHYD MACRO 100 MG PO CAPS: 100.0000 mg | ORAL_CAPSULE | Freq: Every day | ORAL | Status: DC

## 2015-06-30 MED ORDER — HYDROCODONE-ACETAMINOPHEN 10-325 MG PO TABS: 1.0000 | ORAL_TABLET | Freq: Four times a day (QID) | ORAL | Status: DC | PRN

## 2015-06-30 MED ORDER — CIPROFLOXACIN HCL 250 MG PO TABS: 250.0000 mg | ORAL_TABLET | Freq: Two times a day (BID) | ORAL | Status: DC

## 2015-06-30 NOTE — Progress Notes (Signed)
Patient ID: Tracy Mann, female   DOB: Aug 28, 1933, 82 y.o.   MRN: [id number]    Subjective:  HPI  Patient is here for follow up. Atrial fib: Patient takes Warfarin 3 mg daily. Her last INR was 3.4 n 5/24 and today 1.8. She does state she hada lotf of greens the pst couple of days.  Chronic pain: patient needs refills.  Urinary problems: Patient states she has had urinary frequency, urgency, burning, vaginal discomfort starting mild fst of last week that progressed and she had fever around 100 Friday-May 25th and Saturday 26th past weekend with chills and sweats. She was given 6 tablets of Cipro to take BID on May 16th and she finished that. She does mention that she has history on interstitial cystitis and use to see Dr. Jacqlyn Larsen and she would have recurrent symptoms with her urine and infections. Patient was advised by him to take medication as soon as she feels an issue because otherwise it takes 6 months to 1 year for patient's urine to get cleared up. She wanted to share this information if it would be helpful deciding treatment for her today.   Prior to Admission medications   Medication Sig Start Date End Date Taking? Authorizing Provider  ACCU-CHEK AVIVA PLUS test strip  11/23/14  Yes Historical Provider, MD  ciprofloxacin (CIPRO) 250 MG tablet Take 1 tablet (250 mg total) by mouth 2 (two) times daily. 06/15/15  Yes Richard Maceo Pro., MD  COMBIVENT RESPIMAT 20-100 MCG/ACT AERS respimat 1 puff every 6 (six) hours as needed.  07/03/14  Yes Historical Provider, MD  ezetimibe (ZETIA) 10 MG tablet Take 1 tablet (10 mg total) by mouth daily. 06/09/15  Yes Richard Maceo Pro., MD  fluticasone [facility]) 50 MCG/ACT nasal spray Place 2 sprays into both nostrils daily.   Yes Historical Provider, MD  furosemide (LASIX) 20 MG tablet Take 20 mg by mouth 2 (two) times daily as needed.   Yes Historical Provider, MD  HYDROcodone-acetaminophen (NORCO) 10-325 MG tablet Take 1 tablet by mouth every 6 (six)  hours as needed. 03/31/15  Yes Richard Maceo Pro., MD  insulin glargine (LANTUS) 100 UNIT/ML injection Inject 22 Units into the skin at bedtime.   Yes Historical Provider, MD  insulin lispro (HUMALOG) 100 UNIT/ML KiwkPen Inject up to 10 units three times daily before each meal as directed 10/23/13  Yes Historical Provider, MD  levothyroxine (SYNTHROID, LEVOTHROID) 100 MCG tablet Take 100 mcg by mouth daily before breakfast. Reported on 05/20/2015 10/27/14  Yes Historical Provider, MD  lisinopril (PRINIVIL,ZESTRIL) 2.5 MG tablet Take 2.5 mg by mouth every evening.   Yes Historical Provider, MD  LORazepam (ATIVAN) 1 MG tablet Take 1 mg by mouth 2 (two) times daily as needed for anxiety.   Yes Historical Provider, MD  metoprolol succinate (TOPROL-XL) 25 MG 24 hr tablet Take 1 tablet (25 mg total) by mouth 2 (two) times daily. 06/09/15  Yes Minna Merritts, MD  nitroGLYCERIN (NITROSTAT) 0.4 MG SL tablet Place 1 tablet (0.4 mg total) under the tongue every 5 (five) minutes as needed. May repeat for up to 3 doses. 05/21/15  Yes Loletha Grayer, MD  phenazopyridine (PYRIDIUM) 100 MG tablet Take 100 mg by mouth as needed. Reported on 01/21/2015   Yes Historical Provider, MD  rOPINIRole (REQUIP) 1 MG tablet Take 1 mg by mouth at bedtime.   Yes Historical Provider, MD  warfarin (COUMADIN) 3 MG tablet Take 3 mg by mouth daily.   Yes  Historical Provider, MD    Patient Active Problem List   Diagnosis Date Noted  . Angina pectoris ([place]) 06/07/2015  . Angina at rest [facility]) 05/20/2015  . Weakness 03/03/2015  . Mixed Ischemic/Nonischemic Cardiomyopathy   . Type II diabetes mellitus ([place])   . Chronic atrial fibrillation ([place])   . Chronic combined systolic and diastolic CHF (congestive heart failure) ([place])   . Chronic anticoagulation   . Absolute anemia 07/08/2014  . Airway hyperreactivity 07/08/2014  . Cervical muscle strain 07/08/2014  . PNA (pneumonia) 07/08/2014  . Chest pressure 07/08/2014  . Clinical  depression 07/08/2014  . Degeneration of lumbar or lumbosacral intervertebral disc 07/08/2014  . Accumulation of fluid in tissues 07/08/2014  . Anxiety, generalized 07/08/2014  . Acid reflux 07/08/2014  . Folliculitis [id number]  . Adult hypothyroidism 07/08/2014  . Broken leg 07/08/2014  . Cannot sleep 07/08/2014  . Obstructive sleep apnea 07/08/2014  . Arthritis, degenerative 07/08/2014  . Onychia of finger 07/08/2014  . Psoriasis 07/08/2014  . Restless legs syndrome 07/08/2014  . Adult BMI 30+ 07/08/2014  . Calculus of kidney 10/31/2011  . Bladder infection, chronic 10/31/2011  . Incomplete bladder emptying 10/31/2011  . Obesity 11/02/2010  . SHORTNESS OF BREATH 02/01/2010  . Hyperlipidemia 09/16/2008  . HYPERTENSION, HEART CONTROLLED W/O ASSOC CHF 09/16/2008  . CAD, NATIVE VESSEL 09/16/2008  . Atrial fibrillation ([place]) 09/16/2008    Past Medical History  Diagnosis Date  . Chronic atrial fibrillation (HCC)     a. CHA2DS2VASc = 6--> warfarin.  . Chronic combined systolic and diastolic CHF (congestive heart failure) ([place])     a. 10/2012 Echo: EF 35-40%, diff HK, mild MR, mild biatrial enlargement;  b. 11/2012 EF 45-50% by LV gram; c. echo 07/2014: EF 40-45%, mod conc LVH, mod MR, severe biatrial enlargement, PASP 45 mm Hg  . Chronic anticoagulation     a. warfarin.  . Acute myocardial infarction of inferior wall ([place])     a. 10/2005 - Presumed to be 2/2 to a coronary embolus in setting of persistent Afib-->Cath relatively nl cors, EF 45-50% w/ severe distal inferior/apical inferior HK w/ aneursymal appearance.  . Essential hypertension   . Obesity   . Hyperlipidemia, mixed   . Type II diabetes mellitus ([place])   . Mixed Ischemic/Nonischemic Cardiomyopathy     a. 10/2012 Echo: EF 35-40%;  b. 11/2012 EF 45-50% by LV gram.    Social History   Social History  . Marital Status: Widowed    Spouse Name: widowed  . Number of Children: 4  . Years of Education: 14    Occupational History  . retired    Social History Main Topics  . Smoking status: Never Smoker   . Smokeless tobacco: Never Used  . Alcohol Use: No  . Drug Use: No  . Sexual Activity: No   Other Topics Concern  . Not on file   Social History Narrative   Widowed   Has children and grandchildren   Lives in [place]  . Clarithromycin     Hives, headaches, hard time swelling, felt like throat was closing up  . Diphenhydramine   . Estrogens Conjugated   . Sulfonamide Derivatives Swelling    'tongue swelling'    Review of Systems  Constitutional: Positive for malaise/fatigue. Negative for fever and chills.  Respiratory: Negative.   Cardiovascular: Positive for leg swelling. Negative for chest pain and palpitations.  Gastrointestinal: Negative.   Genitourinary: Positive for dysuria, urgency,  frequency and flank pain.  Musculoskeletal: Positive for back pain and joint pain.  Neurological: Negative.   Endo/Heme/Allergies: Bruises/bleeds easily.  Psychiatric/Behavioral: Negative.      There is no immunization history on file for this patient. Objective:  BP 138/72 mmHg  Pulse 72  Temp(Src) 98.3 F (36.8 C)  Resp 16  Wt 229 lb (103.874 kg)  Physical Exam  Constitutional: She is oriented to person, place, and time and well-developed, well-nourished, and in no distress.  HENT:  Head: Normocephalic and atraumatic.  Eyes: Conjunctivae are normal. Pupils are equal, round, and reactive to light.  Neck: Normal range of motion. Neck supple.  Cardiovascular: Normal rate, regular rhythm, normal heart sounds and intact distal pulses.   No murmur heard. Pulmonary/Chest: Effort normal and breath sounds normal. No respiratory distress. She has no wheezes.  Abdominal: Soft. There is no tenderness. There is no rebound.  Musculoskeletal: She exhibits edema (1+).  Neurological: She is alert and oriented to person, place, and time.  Psychiatric:  Mood, memory, affect and judgment normal.    Lab Results  Component Value Date   WBC 10.2 05/21/2015   HGB 13.2 05/21/2015   HCT 38.9 05/21/2015   PLT 231 05/21/2015   GLUCOSE 188* 05/21/2015   CHOL 203* 05/21/2015   TRIG 225* 05/21/2015   HDL 34* 05/21/2015   LDLCALC 124* 05/21/2015   TSH 1.340 07/14/2014   INR 3.4 06/23/2015   HGBA1C 6.2 09/08/2008    CMP     Component Value Date/Time   NA 137 05/21/2015 0142   NA 139 09/08/2014 1219   NA 136 01/23/2014 0406   K 4.0 05/21/2015 0142   K 4.4 01/23/2014 0406   CL 102 05/21/2015 0142   CL 100 01/23/2014 0406   CO2 29 05/21/2015 0142   CO2 30 01/23/2014 0406   GLUCOSE 188* 05/21/2015 0142   GLUCOSE 122* 09/08/2014 1219   GLUCOSE 270* 01/23/2014 0406   BUN 12 05/21/2015 0142   BUN 26 09/08/2014 1219   BUN 26* 01/23/2014 0406   CREATININE 0.77 05/21/2015 0142   CREATININE 0.94 01/23/2014 0406   CALCIUM 8.7* 05/21/2015 0142   CALCIUM 8.7 01/23/2014 0406   PROT 8.1 05/06/2013 2206   ALBUMIN 4.0 09/08/2014 1219   ALBUMIN 3.5 05/06/2013 2206   AST 25 05/06/2013 2206   ALT 20 05/06/2013 2206   ALKPHOS 99 05/06/2013 2206   BILITOT 0.7 05/06/2013 2206   GFRNONAA >60 05/21/2015 0142   GFRNONAA >60 01/23/2014 0406   GFRNONAA >60 05/07/2013 0535   GFRAA >60 05/21/2015 0142   GFRAA >60 01/23/2014 0406   GFRAA >60 05/07/2013 0535    Assessment and Plan :  1. Chronic atrial fibrillation (HCC) INR 1.8 today. Advised patient to take Warfarin 3 mg 2 tablets today and tomorrow and then take 3 mg and re check in 1 to 2 weeks. - POCT INR  2. Primary osteoarthritis involving multiple joints Refills of narcotics provided.  3. Type 2 diabetes mellitus without complication, with long-term current use of insulin ([place]) Follows endocrinologist.  4. Localized edema Stable.  5. IC (interstitial cystitis) Patient use to see urologist at [facility] and Dr. Jacqlyn Larsen for this issue. She use to be on daily regimen for this and UTI. Advised  patient that we may need to refer patient to urologist for further work up. - POCT urinalysis dipstick - POCT UA - Microscopic Only  Patient was seen and examined by Dr. Eulas Post and note was scribed by  Theressa Millard, RMA.   Miguel Aschoff MD [facility] 06/30/2015 1:44 PM

## 2015-07-02 LAB — URINE CULTURE: Organism ID, Bacteria: NO GROWTH

## 2015-07-07 ENCOUNTER — Ambulatory Visit (INDEPENDENT_AMBULATORY_CARE_PROVIDER_SITE_OTHER): Payer: Medicare Other

## 2015-07-07 DIAGNOSIS — I482 Chronic atrial fibrillation, unspecified: Secondary | ICD-10-CM

## 2015-07-07 LAB — POCT INR
INR: 3.9
PT: 46.5

## 2015-07-07 NOTE — Patient Instructions (Signed)
Anticoagulation Dose Instructions as of 07/07/2015      Dorene Grebe Tue Wed Thu Fri Sat   New Dose 3 mg 4.5 mg 3 mg 3 mg 3 mg 4.5 mg 3 mg    Description        3 mg daily except 1.5 mg M & F, follow up in 2 weeks

## 2015-07-15 DIAGNOSIS — H52223 Regular astigmatism, bilateral: Secondary | ICD-10-CM | POA: Diagnosis not present

## 2015-07-15 DIAGNOSIS — H5203 Hypermetropia, bilateral: Secondary | ICD-10-CM | POA: Diagnosis not present

## 2015-07-15 DIAGNOSIS — Z794 Long term (current) use of insulin: Secondary | ICD-10-CM | POA: Diagnosis not present

## 2015-07-15 DIAGNOSIS — H524 Presbyopia: Secondary | ICD-10-CM | POA: Diagnosis not present

## 2015-07-15 DIAGNOSIS — E119 Type 2 diabetes mellitus without complications: Secondary | ICD-10-CM | POA: Diagnosis not present

## 2015-07-21 ENCOUNTER — Ambulatory Visit (INDEPENDENT_AMBULATORY_CARE_PROVIDER_SITE_OTHER): Payer: Medicare Other

## 2015-07-21 DIAGNOSIS — I482 Chronic atrial fibrillation, unspecified: Secondary | ICD-10-CM

## 2015-07-21 LAB — POCT INR
INR: 3.4
PT: 41

## 2015-07-21 MED ORDER — WARFARIN SODIUM 2 MG PO TABS
2.0000 mg | ORAL_TABLET | Freq: Every day | ORAL | Status: DC
Start: 2015-07-21 — End: 2019-09-03

## 2015-07-21 NOTE — Patient Instructions (Addendum)
Anticoagulation Dose Instructions as of 07/21/2015      Tracy Mann Tue Wed Thu Fri Sat   New Dose 2 mg 2 mg 2 mg 2 mg 2 mg 2 mg 2 mg    Description        Hold for 1 day then 2 mg daily, F/U 2 weeks

## 2015-08-04 ENCOUNTER — Ambulatory Visit (INDEPENDENT_AMBULATORY_CARE_PROVIDER_SITE_OTHER): Payer: Medicare Other

## 2015-08-04 DIAGNOSIS — I482 Chronic atrial fibrillation, unspecified: Secondary | ICD-10-CM

## 2015-08-04 LAB — POCT INR
INR: 1.5
PT: 17.5

## 2015-08-04 NOTE — Patient Instructions (Signed)
Anticoagulation Dose Instructions as of 08/04/2015      Dorene Grebe Tue Wed Thu Fri Sat   New Dose 2 mg 4.5 mg 2 mg 4.5 mg 2 mg 4.5 mg 2 mg   Alt Week 4.5 mg 2 mg 4.5 mg 2 mg 4.5 mg 2 mg 4.5 mg    Description        Alternate 2 mg and 3 mg every other day.  F/U 2 weeks

## 2015-08-06 DIAGNOSIS — Z794 Long term (current) use of insulin: Secondary | ICD-10-CM | POA: Diagnosis not present

## 2015-08-06 DIAGNOSIS — E119 Type 2 diabetes mellitus without complications: Secondary | ICD-10-CM | POA: Diagnosis not present

## 2015-08-06 DIAGNOSIS — E039 Hypothyroidism, unspecified: Secondary | ICD-10-CM | POA: Diagnosis not present

## 2015-08-10 DIAGNOSIS — E1165 Type 2 diabetes mellitus with hyperglycemia: Secondary | ICD-10-CM | POA: Diagnosis not present

## 2015-08-10 DIAGNOSIS — E785 Hyperlipidemia, unspecified: Secondary | ICD-10-CM | POA: Diagnosis not present

## 2015-08-10 DIAGNOSIS — E039 Hypothyroidism, unspecified: Secondary | ICD-10-CM | POA: Diagnosis not present

## 2015-08-10 DIAGNOSIS — E781 Pure hyperglyceridemia: Secondary | ICD-10-CM | POA: Diagnosis not present

## 2015-08-18 ENCOUNTER — Ambulatory Visit: Payer: Self-pay

## 2015-08-19 ENCOUNTER — Other Ambulatory Visit: Payer: Self-pay | Admitting: Family Medicine

## 2015-08-19 NOTE — Telephone Encounter (Signed)
Please review-aa 

## 2015-08-19 NOTE — Telephone Encounter (Signed)
Pt contacted office for refill request on the following medications: LORazepam (ATIVAN) 1 MG tablet to [facility]. It looks like the last time this was written was 02/16/12. Please advise. Thanks TNP

## 2015-08-20 ENCOUNTER — Ambulatory Visit (INDEPENDENT_AMBULATORY_CARE_PROVIDER_SITE_OTHER): Payer: Medicare Other | Admitting: Emergency Medicine

## 2015-08-20 DIAGNOSIS — I482 Chronic atrial fibrillation, unspecified: Secondary | ICD-10-CM

## 2015-08-20 LAB — POCT INR
INR: 1.8
PT: 21.7

## 2015-08-20 MED ORDER — LORAZEPAM 1 MG PO TABS: 1.0000 mg | ORAL_TABLET | Freq: Two times a day (BID) | ORAL | Status: DC | PRN

## 2015-08-20 NOTE — Patient Instructions (Signed)
Anticoagulation Dose Instructions as of 08/20/2015      Tracy Mann Tue Wed Thu Fri Sat   New Dose 2 mg 4.5 mg 2 mg 4.5 mg 2 mg 4.5 mg 2 mg    Description        2 mg M,W,F then 3 mg on other days F/U 2 weeks

## 2015-08-20 NOTE — Telephone Encounter (Signed)
Rx called in.   Thanks,   -Deziya Amero  

## 2015-08-20 NOTE — Telephone Encounter (Signed)
Ok to rf times 3

## 2015-08-26 ENCOUNTER — Encounter: Payer: Self-pay | Admitting: Family Medicine

## 2015-09-03 ENCOUNTER — Ambulatory Visit (INDEPENDENT_AMBULATORY_CARE_PROVIDER_SITE_OTHER): Payer: Medicare Other

## 2015-09-03 DIAGNOSIS — I482 Chronic atrial fibrillation, unspecified: Secondary | ICD-10-CM

## 2015-09-03 LAB — POCT INR
INR: 2.7
PT: 31.9

## 2015-09-03 NOTE — Patient Instructions (Signed)
Anticoagulation Dose Instructions as of 09/03/2015      Tracy Mann Tue Wed Thu Fri Sat   New Dose 2 mg 4.5 mg 2 mg 4.5 mg 2 mg 4.5 mg 2 mg    Description   Alt 2 and 3 mg every other day

## 2015-09-08 NOTE — Telephone Encounter (Signed)
error 

## 2015-09-17 ENCOUNTER — Other Ambulatory Visit: Payer: Self-pay | Admitting: Family Medicine

## 2015-09-17 NOTE — Telephone Encounter (Signed)
LOV 06/30/2015. Renaldo Fiddler, CMA

## 2015-09-28 ENCOUNTER — Ambulatory Visit (INDEPENDENT_AMBULATORY_CARE_PROVIDER_SITE_OTHER): Payer: Medicare Other | Admitting: Family Medicine

## 2015-09-28 ENCOUNTER — Encounter: Payer: Self-pay | Admitting: Family Medicine

## 2015-09-28 VITALS — BP 126/74 | HR 72 | Temp 98.6°F | Resp 16 | Wt 232.0 lb

## 2015-09-28 DIAGNOSIS — M1990 Unspecified osteoarthritis, unspecified site: Secondary | ICD-10-CM

## 2015-09-28 DIAGNOSIS — I482 Chronic atrial fibrillation, unspecified: Secondary | ICD-10-CM

## 2015-09-28 DIAGNOSIS — M150 Primary generalized (osteo)arthritis: Secondary | ICD-10-CM

## 2015-09-28 DIAGNOSIS — I119 Hypertensive heart disease without heart failure: Secondary | ICD-10-CM

## 2015-09-28 DIAGNOSIS — F411 Generalized anxiety disorder: Secondary | ICD-10-CM | POA: Diagnosis not present

## 2015-09-28 DIAGNOSIS — M159 Polyosteoarthritis, unspecified: Secondary | ICD-10-CM

## 2015-09-28 MED ORDER — HYDROCODONE-ACETAMINOPHEN 10-325 MG PO TABS: 1.0000 | ORAL_TABLET | Freq: Four times a day (QID) | ORAL | 0 refills | Status: DC | PRN

## 2015-09-28 NOTE — Progress Notes (Signed)
Patient: Tracy Mann Female    DOB: 1933-03-10   82 y.o.   MRN: [id number] Visit Date: 09/28/2015  Today's Provider: Wilhemena Durie, MD   Chief Complaint  Patient presents with  . Arthritis  . Anxiety  . Hypertension  . Ear Pain    [place] pain for 2-3 days.   Subjective:    Hypertension  This is a chronic problem. The problem is unchanged. The problem is controlled. Associated symptoms include anxiety, headaches (allergy headaches), malaise/fatigue and peripheral edema. Pertinent negatives include no chest pain, neck pain or palpitations. There are no compliance problems.   Anxiety  Presents for follow-up visit. Symptoms include depressed mood (Occasionally; especially when she is feeling bac. ), insomnia (Especially recently.), nausea (Likely from the sinus drainage.) and nervous/anxious behavior. Patient reports no chest pain, confusion, decreased concentration, palpitations or suicidal ideas. The quality of sleep is poor.    Arthritis  Presents for follow-up visit. She reports no joint swelling. The symptoms have been stable (Pt reports her pain is in good control with current medications.). Associated symptoms include fatigue. Pertinent negatives include no diarrhea or fever.  Otalgia   There is pain in the left ear. The current episode started in the past 7 days. The problem has been waxing and waning. There has been no fever. Associated symptoms include headaches (allergy headaches) and rhinorrhea. Pertinent negatives include no abdominal pain, diarrhea, ear discharge, neck pain, sore throat or vomiting.       Allergies  Allergen Reactions  . Clarithromycin     Hives, headaches, hard time swelling, felt like throat was closing up  . Diphenhydramine   . Estrogens Conjugated   . Sulfonamide Derivatives Swelling    'tongue swelling'   No outpatient prescriptions have been marked as taking for the 09/28/15 encounter (Office Visit) with Jerrol Banana., MD.     Review of Systems  Constitutional: Positive for fatigue and malaise/fatigue. Negative for activity change, appetite change, chills, diaphoresis, fever and unexpected weight change.  HENT: Positive for congestion, ear pain (Left ear pain), rhinorrhea and sinus pressure. Negative for ear discharge, postnasal drip, sore throat and tinnitus.   Eyes: Positive for itching. Negative for photophobia, pain, discharge, redness and visual disturbance.  Respiratory: Negative.   Cardiovascular: Positive for leg swelling. Negative for chest pain and palpitations.  Gastrointestinal: Positive for nausea (Likely from the sinus drainage.). Negative for abdominal distention, abdominal pain, anal bleeding, blood in stool, constipation, diarrhea, rectal pain and vomiting.  Endocrine: Negative.   Musculoskeletal: Positive for arthralgias, arthritis and back pain. Negative for gait problem, joint swelling, myalgias, neck pain and neck stiffness.  Allergic/Immunologic: Positive for environmental allergies.  Neurological: Positive for headaches (allergy headaches).  Hematological: Does not bruise/bleed easily.  Psychiatric/Behavioral: Positive for sleep disturbance (Pt reports not sleeping well right now.  She says she is "Off her schedule" right now. ). Negative for agitation, behavioral problems, confusion, decreased concentration, dysphoric mood, hallucinations, self-injury and suicidal ideas. The patient is nervous/anxious and has insomnia (Especially recently.). The patient is not hyperactive.     Social History  Substance Use Topics  . Smoking status: Never Smoker  . Smokeless tobacco: Never Used  . Alcohol use No   Objective:   BP 126/74 (BP Location: Right Arm, Patient Position: Sitting, Cuff Size: Large)   Pulse 72   Temp 98.6 F (37 C) (Oral)   Resp 16   Wt 232 lb (105.2 kg)   BMI  38.61 kg/m   Physical Exam  Constitutional: She is oriented to person, place, and time. She appears well-developed  and well-nourished.  HENT:  Head: Normocephalic and atraumatic.  Right Ear: External ear normal.  Left Ear: External ear normal.  Nose: Nose normal.  Mouth/Throat: Oropharynx is clear and moist.  Eyes: Conjunctivae and EOM are normal. Pupils are equal, round, and reactive to light.  Neck: Normal range of motion. Neck supple.  Cardiovascular: Normal rate, regular rhythm and normal heart sounds.   Pulmonary/Chest: Effort normal and breath sounds normal.  Musculoskeletal: She exhibits edema.  Trace lower extremity edema  Neurological: She is alert and oriented to person, place, and time. She has normal reflexes.  Skin: Skin is warm and dry.  Psychiatric: She has a normal mood and affect. Her behavior is normal. Judgment and thought content normal.   Anticoagulation Dose Instructions as of 09/28/2015      Dorene Grebe Tue Wed Thu Fri Sat   New Dose 3 mg 2 mg 3 mg 2 mg 3 mg 2 mg 3 mg    Description   Take 3mg  daily except 2mg  M, W, F.  Recheck in 3 weeks.           Assessment & Plan:     1. HYPERTENSION, HEART CONTROLLED W/O ASSOC CHF Stable continue current medications.  2. Osteoarthritis, unspecified osteoarthritis type, unspecified site Stable refills as below.   3. Anxiety, generalized Stable; but with trouble sleeping.  Will hold off on medications for now and just try good sleep hygiene habits.   4. Primary osteoarthritis involving multiple joints Stable, refills provided.  Recheck in three months.   - HYDROcodone-acetaminophen (NORCO) 10-325 MG tablet; Take 1 tablet by mouth every 6 (six) hours as needed. May be filled after 11/28/2015  Dispense: 120 tablet; Refill: 0  5. Chronic atrial fibrillation (HCC) INR a little low.  Will adjust as above.  Recheck in about three weeks.   - POCT INR  6.  Allergic Rhinitis Suspect cause for ear pain.  Continue current allergy medications including her sterioid nasal spray.  7. Obesity Need for dietary changes stress. Patient was  seen and examined by Miguel Aschoff, MD, and note scribed by Ashley Royalty, CMA.         Donzel Romack Cranford Mon, MD  [facility]

## 2015-09-29 LAB — POCT INR
INR: 1.8
PT: 21

## 2015-09-29 NOTE — Patient Instructions (Signed)
Take 3mg  daily except 2mg  M, W, F.  Recheck in 3 weeks.

## 2015-09-30 ENCOUNTER — Ambulatory Visit: Payer: Medicare Other | Admitting: Family Medicine

## 2015-09-30 ENCOUNTER — Telehealth: Payer: Self-pay | Admitting: Family Medicine

## 2015-09-30 NOTE — Telephone Encounter (Signed)
[name] spoke with patient and patient Tracy Mann

## 2015-09-30 NOTE — Telephone Encounter (Signed)
Pt needs clarification on how much of the warfarin to take.  She said she was told different than what the instructions say.   Please call her back [phone]  Thanks [name]

## 2015-09-30 NOTE — Telephone Encounter (Signed)
Please check. Thank you. 

## 2015-10-13 ENCOUNTER — Ambulatory Visit (INDEPENDENT_AMBULATORY_CARE_PROVIDER_SITE_OTHER): Payer: Medicare Other

## 2015-10-13 DIAGNOSIS — I482 Chronic atrial fibrillation, unspecified: Secondary | ICD-10-CM

## 2015-10-13 LAB — POCT INR
INR: 1.2
PT: 15

## 2015-10-13 NOTE — Patient Instructions (Signed)
Anticoagulation Dose Instructions as of 10/13/2015      Tracy Mann Tue Wed Thu Fri Sat   New Dose 3 mg 3 mg 3 mg 3 mg 3 mg 3 mg 3 mg    Description   Take 3 mg every day, recheck in 2 weeks.

## 2015-10-27 ENCOUNTER — Ambulatory Visit (INDEPENDENT_AMBULATORY_CARE_PROVIDER_SITE_OTHER): Payer: Medicare Other

## 2015-10-27 DIAGNOSIS — I482 Chronic atrial fibrillation, unspecified: Secondary | ICD-10-CM

## 2015-10-27 LAB — POCT INR
INR: 1.3
PT: 15.8

## 2015-10-27 MED ORDER — WARFARIN SODIUM 4 MG PO TABS: 4.0000 mg | ORAL_TABLET | Freq: Every day | ORAL | 12 refills | Status: DC

## 2015-10-27 NOTE — Patient Instructions (Signed)
Anticoagulation Dose Instructions as of 10/27/2015      Tracy Mann Tue Wed Thu Fri Sat   New Dose 3 mg 3 mg 3 mg 3 mg 3 mg 3 mg 3 mg    Description   Take 4 mg every day, recheck in 2 weeks.

## 2015-10-28 ENCOUNTER — Telehealth: Payer: Self-pay | Admitting: Family Medicine

## 2015-10-28 NOTE — Telephone Encounter (Signed)
Please review-aa 

## 2015-10-28 NOTE — Telephone Encounter (Signed)
Pt states that this time of the year she feels like she needs to take more of the Rx LORazepam (ATIVAN) 1 MG tablet.  Pt states she is taking 2 pills a day.  Pt states she feels like she has more anxiety from August until November.  Pt states the Rx she has does not allow her to take 2 pills a day.  Pt is requesting an additional Rx sent in to [facility].  CB#[phone]/MW

## 2015-10-28 NOTE — Telephone Encounter (Signed)
Last RX it was written for 1 BID but it said #30 not 60, is it ok to re write it with correct amount of tablets to cover BID?-aa

## 2015-10-28 NOTE — Telephone Encounter (Signed)
Yes  thx

## 2015-10-28 NOTE — Telephone Encounter (Signed)
It is written as twice a day.

## 2015-10-28 NOTE — Telephone Encounter (Signed)
Called medication into pharmacy. 

## 2015-11-10 ENCOUNTER — Ambulatory Visit: Payer: Self-pay

## 2015-11-12 ENCOUNTER — Ambulatory Visit (INDEPENDENT_AMBULATORY_CARE_PROVIDER_SITE_OTHER): Payer: Medicare Other

## 2015-11-12 DIAGNOSIS — Z794 Long term (current) use of insulin: Secondary | ICD-10-CM | POA: Diagnosis not present

## 2015-11-12 DIAGNOSIS — I482 Chronic atrial fibrillation, unspecified: Secondary | ICD-10-CM

## 2015-11-12 DIAGNOSIS — E785 Hyperlipidemia, unspecified: Secondary | ICD-10-CM | POA: Diagnosis not present

## 2015-11-12 DIAGNOSIS — E1165 Type 2 diabetes mellitus with hyperglycemia: Secondary | ICD-10-CM | POA: Diagnosis not present

## 2015-11-12 DIAGNOSIS — E781 Pure hyperglyceridemia: Secondary | ICD-10-CM | POA: Diagnosis not present

## 2015-11-12 LAB — POCT INR
INR: 2.3
PT: 27.4

## 2015-11-12 NOTE — Patient Instructions (Signed)
Anticoagulation Dose Instructions as of 11/12/2015      Tracy Mann Tue Wed Thu Fri Sat   New Dose 4 mg 4 mg 4 mg 4 mg 4 mg 4 mg 4 mg    Description   Take 4 mg every day, recheck in 4 weeks.

## 2015-11-16 DIAGNOSIS — E559 Vitamin D deficiency, unspecified: Secondary | ICD-10-CM | POA: Diagnosis not present

## 2015-11-16 DIAGNOSIS — E1165 Type 2 diabetes mellitus with hyperglycemia: Secondary | ICD-10-CM | POA: Diagnosis not present

## 2015-11-16 DIAGNOSIS — E785 Hyperlipidemia, unspecified: Secondary | ICD-10-CM | POA: Diagnosis not present

## 2015-11-16 DIAGNOSIS — R29898 Other symptoms and signs involving the musculoskeletal system: Secondary | ICD-10-CM | POA: Diagnosis not present

## 2015-11-16 DIAGNOSIS — Z794 Long term (current) use of insulin: Secondary | ICD-10-CM | POA: Diagnosis not present

## 2015-11-16 DIAGNOSIS — E039 Hypothyroidism, unspecified: Secondary | ICD-10-CM | POA: Diagnosis not present

## 2015-11-25 DIAGNOSIS — E559 Vitamin D deficiency, unspecified: Secondary | ICD-10-CM | POA: Diagnosis not present

## 2015-11-25 DIAGNOSIS — E781 Pure hyperglyceridemia: Secondary | ICD-10-CM | POA: Diagnosis not present

## 2015-11-25 DIAGNOSIS — E039 Hypothyroidism, unspecified: Secondary | ICD-10-CM | POA: Diagnosis not present

## 2015-11-25 DIAGNOSIS — E785 Hyperlipidemia, unspecified: Secondary | ICD-10-CM | POA: Diagnosis not present

## 2015-11-25 DIAGNOSIS — E1165 Type 2 diabetes mellitus with hyperglycemia: Secondary | ICD-10-CM | POA: Diagnosis not present

## 2015-12-03 ENCOUNTER — Ambulatory Visit: Payer: Medicare Other | Admitting: Cardiovascular Disease

## 2015-12-08 ENCOUNTER — Ambulatory Visit (INDEPENDENT_AMBULATORY_CARE_PROVIDER_SITE_OTHER): Payer: Medicare Other

## 2015-12-08 DIAGNOSIS — I482 Chronic atrial fibrillation, unspecified: Secondary | ICD-10-CM

## 2015-12-08 LAB — POCT INR
INR: 2.5
PT: 30.5

## 2015-12-08 NOTE — Patient Instructions (Signed)
Anticoagulation Dose Instructions as of 12/08/2015      Tracy Mann Tue Wed Thu Fri Sat   New Dose 4 mg 4 mg 4 mg 4 mg 4 mg 4 mg 4 mg    Description   Take 4 mg every day, recheck in 4 weeks.

## 2015-12-10 ENCOUNTER — Ambulatory Visit: Payer: Self-pay

## 2015-12-27 ENCOUNTER — Ambulatory Visit (INDEPENDENT_AMBULATORY_CARE_PROVIDER_SITE_OTHER): Payer: Medicare Other | Admitting: Family Medicine

## 2015-12-27 DIAGNOSIS — M159 Polyosteoarthritis, unspecified: Secondary | ICD-10-CM

## 2015-12-27 DIAGNOSIS — M150 Primary generalized (osteo)arthritis: Secondary | ICD-10-CM

## 2015-12-27 MED ORDER — HYDROCODONE-ACETAMINOPHEN 10-325 MG PO TABS: 1.0000 | ORAL_TABLET | Freq: Four times a day (QID) | ORAL | 0 refills | Status: DC | PRN

## 2015-12-27 MED ORDER — HYDROCODONE-ACETAMINOPHEN 10-325 MG PO TABS: 1.0000 | ORAL_TABLET | Freq: Three times a day (TID) | ORAL | 0 refills | Status: DC | PRN

## 2015-12-27 NOTE — Progress Notes (Signed)
Tracy Mann  MRN: [id number] DOB: [birth year]  Subjective:  HPI   The patient is an 82 year old female who presents for follow up of her chronic pain.  She states she is on Hydrocodone and takes it 4 times daily.  She states that if she is late by an hour taking it she will know because the pain will increase.  She mostly has leg pain from her knees down.  The patient said that if she waits to take her medicine she gets jumping pain" because it will hurt in various areas of her body.    Patient Active Problem List   Diagnosis Date Noted  . Angina pectoris ([place]) 06/07/2015  . Angina at rest [facility]) 05/20/2015  . Weakness 03/03/2015  . Mixed Ischemic/Nonischemic Cardiomyopathy   . Type II diabetes mellitus ([place])   . Chronic atrial fibrillation ([place])   . Chronic combined systolic and diastolic CHF (congestive heart failure) ([place])   . Chronic anticoagulation   . Absolute anemia 07/08/2014  . Airway hyperreactivity 07/08/2014  . Cervical muscle strain 07/08/2014  . PNA (pneumonia) 07/08/2014  . Chest pressure 07/08/2014  . Clinical depression 07/08/2014  . Degeneration of lumbar or lumbosacral intervertebral disc 07/08/2014  . Accumulation of fluid in tissues 07/08/2014  . Anxiety, generalized 07/08/2014  . Acid reflux 07/08/2014  . Folliculitis 99991111  . Adult hypothyroidism 07/08/2014  . Broken leg 07/08/2014  . Cannot sleep 07/08/2014  . Obstructive sleep apnea 07/08/2014  . Arthritis, degenerative 07/08/2014  . Onychia of finger 07/08/2014  . Psoriasis 07/08/2014  . Restless legs syndrome 07/08/2014  . Adult BMI 30+ 07/08/2014  . Calculus of kidney 10/31/2011  . Bladder infection, chronic 10/31/2011  . Incomplete bladder emptying 10/31/2011  . Obesity 11/02/2010  . SHORTNESS OF BREATH 02/01/2010  . Hyperlipidemia 09/16/2008  . HYPERTENSION, HEART CONTROLLED W/O ASSOC CHF 09/16/2008  . CAD, NATIVE VESSEL 09/16/2008  . Atrial fibrillation ([place]) 09/16/2008    Past  Medical History:  Diagnosis Date  . Acute myocardial infarction of inferior wall ([place])    a. 10/2005 - Presumed to be 2/2 to a coronary embolus in setting of persistent Afib-->Cath relatively nl cors, EF 45-50% w/ severe distal inferior/apical inferior HK w/ aneursymal appearance.  . Chronic anticoagulation    a. warfarin.  . Chronic atrial fibrillation (HCC)    a. CHA2DS2VASc = 6--> warfarin.  . Chronic combined systolic and diastolic CHF (congestive heart failure) ([place])    a. 10/2012 Echo: EF 35-40%, diff HK, mild MR, mild biatrial enlargement;  b. 11/2012 EF 45-50% by LV gram; c. echo 07/2014: EF 40-45%, mod conc LVH, mod MR, severe biatrial enlargement, PASP 45 mm Hg  . Essential hypertension   . Hyperlipidemia, mixed   . Mixed Ischemic/Nonischemic Cardiomyopathy    a. 10/2012 Echo: EF 35-40%;  b. 11/2012 EF 45-50% by LV gram.  . Obesity   . Type II diabetes mellitus ([place])     Social History   Social History  . Marital status: Widowed    Spouse name: widowed  . Number of children: 4  . Years of education: 14   Occupational History  . retired    Social History Main Topics  . Smoking status: Never Smoker  . Smokeless tobacco: Never Used  . Alcohol use No  . Drug use: No  . Sexual activity: No   Other Topics Concern  . Not on file   Social History Narrative   Widowed   Has children and  grandchildren   Lives in [place]    Outpatient Encounter Prescriptions as of 12/27/2015  Medication Sig  . ACCU-CHEK AVIVA PLUS test strip   . COMBIVENT RESPIMAT 20-100 MCG/ACT AERS respimat 1 puff every 6 (six) hours as needed.   . ezetimibe (ZETIA) 10 MG tablet Take 1 tablet (10 mg total) by mouth daily.  . fluticasone (FLONASE) 50 MCG/ACT nasal spray Place 2 sprays into both nostrils daily.  . furosemide (LASIX) 20 MG tablet Take 20 mg by mouth 2 (two) times daily as needed.  Marland Kitchen HYDROcodone-acetaminophen (NORCO) 10-325 MG tablet Take 1 tablet by mouth every 6 (six) hours as  needed. May be filled after 11/28/2015  . insulin glargine (LANTUS) 100 UNIT/ML injection Inject 22 Units into the skin at bedtime.  . insulin lispro (HUMALOG) 100 UNIT/ML KiwkPen Inject up to 10 units three times daily before each meal as directed  . levothyroxine (SYNTHROID, LEVOTHROID) 100 MCG tablet Take 100 mcg by mouth daily before breakfast. Reported on 05/20/2015  . lisinopril (PRINIVIL,ZESTRIL) 2.5 MG tablet Take 2.5 mg by mouth every evening.  Marland Kitchen LORazepam (ATIVAN) 1 MG tablet Take 1 tablet (1 mg total) by mouth 2 (two) times daily as needed for anxiety.  . metoprolol succinate (TOPROL-XL) 25 MG 24 hr tablet Take 1 tablet (25 mg total) by mouth 2 (two) times daily.  . nitrofurantoin, macrocrystal-monohydrate, (MACROBID) 100 MG capsule Take 1 capsule (100 mg total) by mouth daily.  . nitroGLYCERIN (NITROSTAT) 0.4 MG SL tablet Place 1 tablet (0.4 mg total) under the tongue every 5 (five) minutes as needed. May repeat for up to 3 doses.  . phenazopyridine (PYRIDIUM) 100 MG tablet Take 100 mg by mouth as needed. Reported on 01/21/2015  . rOPINIRole (REQUIP) 1 MG tablet Take 1 mg by mouth at bedtime.  . vitamin B-12 (CYANOCOBALAMIN) 1000 MCG tablet Take 1,000 mcg by mouth daily.  . Vitamin D, Ergocalciferol, (DRISDOL) 50000 units CAPS capsule Take 50,000 Units by mouth every 7 (seven) days.  Marland Kitchen warfarin (COUMADIN) 2 MG tablet Take 1 tablet (2 mg total) by mouth daily.  Marland Kitchen warfarin (COUMADIN) 3 MG tablet Take 3 mg by mouth daily.  Marland Kitchen warfarin (COUMADIN) 3 MG tablet TAKE 1 TABLET (3 MG TOTAL) BY MOUTH DAILY.  Marland Kitchen warfarin (COUMADIN) 4 MG tablet Take 1 tablet (4 mg total) by mouth daily.  . [DISCONTINUED] ciprofloxacin (CIPRO) 250 MG tablet Take 1 tablet (250 mg total) by mouth 2 (two) times daily.  . [DISCONTINUED] HYDROcodone-acetaminophen (NORCO) 10-325 MG tablet Take 1 tablet by mouth every 6 (six) hours as needed.   No facility-administered encounter medications on file as of 12/27/2015.      Allergies  Allergen Reactions  . Other Anaphylaxis and Swelling    'tongue swelling'  . Sulfa Antibiotics Anaphylaxis  . Clarithromycin Other (See Comments)    Hives, headaches, hard time swelling, felt like throat was closing up Hives, headaches, hard time swelling, felt like throat was closing up  . Diphenhydramine Other (See Comments)  . Estrogens Conjugated   . Estrogens, Conjugated Other (See Comments)  . Sulfonamide Derivatives Swelling    'tongue swelling'  . Nitrofurantoin Nausea Only    Review of Systems  Constitutional: Negative for chills, fever and malaise/fatigue.  Eyes: Negative.   Respiratory: Positive for shortness of breath (chronic but unchanged). Negative for cough and wheezing.   Cardiovascular: Positive for orthopnea (Patient stayes she always has to sleep on 3 pillows.). Negative for chest pain, palpitations, claudication, leg swelling and  PND.  Gastrointestinal: Negative.   Genitourinary: Negative.   Musculoskeletal: Positive for back pain.  Skin: Negative.   Neurological: Negative for dizziness, weakness and headaches.  Endo/Heme/Allergies: Negative.   Psychiatric/Behavioral: Negative.     Objective:  BP 110/60 (BP Location: Right Arm, Patient Position: Sitting, Cuff Size: Normal)   Pulse 60   Temp 97.7 F (36.5 C) (Oral)   Resp 16   Wt 238 lb (108 kg)   BMI 39.61 kg/m   Physical Exam  Constitutional: She is oriented to person, place, and time and well-developed, well-nourished, and in no distress.  HENT:  Head: Normocephalic and atraumatic.  Right Ear: External ear normal.  Left Ear: External ear normal.  Nose: Nose normal.  Eyes: Pupils are equal, round, and reactive to light.  Neck: Normal range of motion. Neck supple.  Cardiovascular: Normal rate, regular rhythm and normal heart sounds.   Pulmonary/Chest: Effort normal and breath sounds normal.  Abdominal: Soft.  Neurological: She is alert and oriented to person, place, and time. No  cranial nerve deficit. She exhibits normal muscle tone. Coordination normal.  Skin: Skin is warm and dry.  Psychiatric: Mood, memory and affect normal.    Assessment and Plan :  OA/DDD/Chronic Pain Hydrocodone refilled 3 prescriptions but I cautioned the patient to try to take less instead of more of the narcotic. Type 2 diabetes Hypertension Chronic A. Fib Chronic anxiety  I have done the exam and reviewed the chart and it is accurate to the best of my knowledge. Development worker, community has been used and  any errors in dictation or transcription are unintentional. Miguel Aschoff M.D. [facility]

## 2016-01-03 ENCOUNTER — Ambulatory Visit (INDEPENDENT_AMBULATORY_CARE_PROVIDER_SITE_OTHER): Payer: Medicare Other | Admitting: Cardiovascular Disease

## 2016-01-03 ENCOUNTER — Encounter: Payer: Self-pay | Admitting: Cardiovascular Disease

## 2016-01-03 VITALS — BP 122/60 | HR 71 | Ht 65.0 in | Wt 240.5 lb

## 2016-01-03 DIAGNOSIS — I5042 Chronic combined systolic (congestive) and diastolic (congestive) heart failure: Secondary | ICD-10-CM

## 2016-01-03 DIAGNOSIS — R0602 Shortness of breath: Secondary | ICD-10-CM

## 2016-01-03 DIAGNOSIS — G4733 Obstructive sleep apnea (adult) (pediatric): Secondary | ICD-10-CM

## 2016-01-03 DIAGNOSIS — E782 Mixed hyperlipidemia: Secondary | ICD-10-CM

## 2016-01-03 DIAGNOSIS — I10 Essential (primary) hypertension: Secondary | ICD-10-CM

## 2016-01-03 DIAGNOSIS — I2510 Atherosclerotic heart disease of native coronary artery without angina pectoris: Secondary | ICD-10-CM

## 2016-01-03 DIAGNOSIS — I428 Other cardiomyopathies: Secondary | ICD-10-CM | POA: Diagnosis not present

## 2016-01-03 DIAGNOSIS — E119 Type 2 diabetes mellitus without complications: Secondary | ICD-10-CM

## 2016-01-03 DIAGNOSIS — I482 Chronic atrial fibrillation, unspecified: Secondary | ICD-10-CM

## 2016-01-03 DIAGNOSIS — Z794 Long term (current) use of insulin: Secondary | ICD-10-CM

## 2016-01-03 NOTE — Patient Instructions (Signed)

## 2016-01-03 NOTE — Progress Notes (Signed)
Cardiology Office Note  Date:  01/03/2016   ID:  Darra, Janosky 1933-04-06, MRN [id number]  PCP:  Wilhemena Durie, MD   Chief Complaint  Patient presents with  . other    6 month follow up. Meds reviewed by the pt. verbally. Pt. c/o shortness of breath with little exertion.     HPI:  Ms. Banasik is a 82 year old woman with a PMH significant for chronic atrial fibrillation on Coumadin therapy, diastolic dysfunction, hypertension, hyperlipidemia, morbid obesity, and history of an  inferior wall myocardial infarction that was felt to be secondary to a thrombus from atrial fib. in October 2007 at [facility], previous episodes of chest pain who presents for followup  Of her coronary artery disease and leg edema. Previous echocardiogram with moderate pulmonary hypertension  In follow-up today she reports that she is doing well, overall feels well No regular exercise program, and bleeding with a walker No recent falls. Legs feel stronger when she walks with a walker Denies any chest pain, palpitations, shortness of breath on exertion Takes 1 or 2 Lasix per day depending on her leg swelling Eats lots of bananas  Labwork discussed with her HBA1C 8.3, up from 6.7 She reports sugars are running better now TSH 11, Vit D 17. Takes vitamin D supplement  EKG on today's visit shows atrial fibrillation with ventricular rate 71 bpm, left axis deviation  Other past medical history reviewed In follow-up today, she reports that she had an episode of chest pain 05/21/2015 on the left side She went to the emergency room, was admitted. Hospital records reviewed. Cardiac enzymes were unrevealing, EKG unchanged. She had stress Myoview showing old inferior MI with no ischemia consistent with her known anatomy. She did not take nitroglycerin for her symptoms.  Previously seen by vein and vascular, Dr. Ronalee Belts, recommended to wear TED hose or ACE wraps It was mentioned that perhaps  she could wear lymphedema compression pump. She does not have this yet She does sit down during the daytime with her legs down, unable to get her legs up very high Difficulty touching her toes, unable to put compression hose on  diabetes, currently on insulin. Followed by Dr. Blossom Hoops. Previous echocardiogram showing ejection fraction 35-40% in 2014 Follow-up cardiac catheterization showing ejection fraction 45-50%  Continued chronic knee problems, difficulty walking long distances  Other past medical history On a previous visit, she had worsening shortness of breath, poor energy.  Echocardiogram was done that showed moderately depressed ejection fraction of 35%. Previously she had been 50%. She felt poorly  with shortness of breath and fatigue with any exertion.   Cardiac catheterization was performed to rule out ischemia as a cause of her depressed ejection fraction. This was done on 12/05/2012. She had a right dominant coronary arterial system with mild luminal irregularities, no significant stenoses that could contribute to her depressed ejection fraction. EF was estimated at 45%. There was an aneurysmal/severely hypokinetic region in the distal inferior and apical inferior wall from old MI 2007.   Previous  echocardiogram several years ago showed ejection fraction 50%. Moderately elevated right ventricular systolic pressures. Previous 48-hour Holter monitor, which showed that she was in persistent atrial fibrillation with one episode of rapid ventricular response.      total cholesterol 198, triglycerides 300, LDL 104   PMH:   has a past medical history of Acute myocardial infarction of inferior wall ([place]); Chronic anticoagulation; Chronic atrial fibrillation ([place]); Chronic combined systolic and diastolic  CHF (congestive heart failure) ([place]); Essential hypertension; Hyperlipidemia, mixed; Mixed Ischemic/Nonischemic Cardiomyopathy; Obesity; and Type II diabetes mellitus ([place]).  PSH:     Past Surgical History:  Procedure Laterality Date  . CARDIAC CATHETERIZATION  11/2012   [facility];EF 45-50%  . CHOLECYSTECTOMY  1999  . TUBAL LIGATION  1968  . VESICOVAGINAL FISTULA CLOSURE W/ TAH  1996    Current Outpatient Prescriptions  Medication Sig Dispense Refill  . ACCU-CHEK AVIVA PLUS test strip     . COMBIVENT RESPIMAT 20-100 MCG/ACT AERS respimat 1 puff every 6 (six) hours as needed.     . ezetimibe (ZETIA) 10 MG tablet Take 1 tablet (10 mg total) by mouth daily. 30 tablet 12  . fluticasone (FLONASE) 50 MCG/ACT nasal spray Place 2 sprays into both nostrils daily.    . furosemide (LASIX) 20 MG tablet Take 20 mg by mouth 2 (two) times daily as needed.    Marland Kitchen HYDROcodone-acetaminophen (NORCO) 10-325 MG tablet Take 1 tablet by mouth every 6 (six) hours as needed. 120 tablet 0  . insulin glargine (LANTUS) 100 UNIT/ML injection Inject 22 Units into the skin at bedtime.    . insulin lispro (HUMALOG) 100 UNIT/ML KiwkPen Inject up to 10 units three times daily before each meal as directed    . lisinopril (PRINIVIL,ZESTRIL) 2.5 MG tablet Take 2.5 mg by mouth every evening.    Marland Kitchen LORazepam (ATIVAN) 1 MG tablet Take 1 tablet (1 mg total) by mouth 2 (two) times daily as needed for anxiety. 30 tablet 3  . metoprolol succinate (TOPROL-XL) 25 MG 24 hr tablet Take 1 tablet (25 mg total) by mouth 2 (two) times daily. 180 tablet 3  . nitrofurantoin, macrocrystal-monohydrate, (MACROBID) 100 MG capsule Take 1 capsule (100 mg total) by mouth daily. 30 capsule 12  . nitroGLYCERIN (NITROSTAT) 0.4 MG SL tablet Place 1 tablet (0.4 mg total) under the tongue every 5 (five) minutes as needed. May repeat for up to 3 doses. 30 tablet 0  . phenazopyridine (PYRIDIUM) 100 MG tablet Take 100 mg by mouth as needed. Reported on 01/21/2015    . rOPINIRole (REQUIP) 1 MG tablet Take 1 mg by mouth at bedtime.    . vitamin B-12 (CYANOCOBALAMIN) 1000 MCG tablet Take 1,000 mcg by mouth daily.    . Vitamin D, Ergocalciferol,  (DRISDOL) 50000 units CAPS capsule Take 50,000 Units by mouth every 7 (seven) days.    Marland Kitchen warfarin (COUMADIN) 2 MG tablet Take 1 tablet (2 mg total) by mouth daily. 30 tablet 3  . warfarin (COUMADIN) 3 MG tablet TAKE 1 TABLET (3 MG TOTAL) BY MOUTH DAILY. 30 tablet 5  . warfarin (COUMADIN) 4 MG tablet Take 1 tablet (4 mg total) by mouth daily. 30 tablet 12   No current facility-administered medications for this visit.      Allergies:   Other; Sulfa antibiotics; Clarithromycin; Diphenhydramine; Estrogens conjugated; Estrogens, conjugated; Sulfonamide derivatives; and Nitrofurantoin   Social History:  The patient  reports that she has never smoked. She has never used smokeless tobacco. She reports that she does not drink alcohol or use drugs.   Family History:   family history includes Heart disease in her mother; Thyroid disease in her father.    Review of Systems: Review of Systems  Constitutional: Negative.   Respiratory: Negative.   Cardiovascular: Negative.   Gastrointestinal: Negative.   Musculoskeletal: Negative.   Neurological: Negative.   Psychiatric/Behavioral: Negative.   All other systems reviewed and are negative.    PHYSICAL EXAM:  VS:  BP 122/60 (BP Location: Left Arm, Patient Position: Sitting, Cuff Size: Normal)   Pulse 71   Ht 5\' 5"  (1.651 m)   Wt 240 lb 8 oz (109.1 kg)   BMI 40.02 kg/m  , BMI Body mass index is 40.02 kg/m. GEN: Well nourished, well developed, in no acute distress, obese  HEENT: normal  Neck: no JVD, carotid bruits, or masses Cardiac: RRR; no murmurs, rubs, or gallops, trace lower extremity edema, nonpitting bilaterally Respiratory:  clear to auscultation bilaterally, normal work of breathing GI: soft, nontender, nondistended, + BS MS: no deformity or atrophy  Skin: warm and dry, no rash Neuro:  Strength and sensation are intact Psych: euthymic mood, full affect    Recent Labs: 05/21/2015: BUN 12; Creatinine, Ser 0.77; Hemoglobin 13.2;  Platelets 231; Potassium 4.0; Sodium 137    Lipid Panel Lab Results  Component Value Date   CHOL 203 (H) 05/21/2015   HDL 34 (L) 05/21/2015   LDLCALC 124 (H) 05/21/2015   TRIG 225 (H) 05/21/2015      Wt Readings from Last 3 Encounters:  01/03/16 240 lb 8 oz (109.1 kg)  12/27/15 238 lb (108 kg)  09/28/15 232 lb (105.2 kg)       ASSESSMENT AND PLAN:  Mixed hyperlipidemia Unable to tolerate statins, tolerating zetia  Recent total cholesterol slightly less than 200 Numbers may improve with better diabetes control  Essential hypertension - Plan: EKG 12-Lead Blood pressure is well controlled on today's visit. No changes made to the medications.  Atherosclerosis of native coronary artery of native heart without angina pectoris Currently with no symptoms of angina. No further workup at this time. Continue current medication regimen.  Mixed Ischemic/Nonischemic Cardiomyopathy - Plan: EKG 12-Lead Recommended she take 1-2 Lasix per day for leg swelling, shortness of breath Weight is up 8 pounds from her prior clinic visit  Chronic atrial fibrillation ([place]) - Plan: EKG 12-Lead Tolerating warfarin, rate well controlled  Chronic combined systolic and diastolic CHF (congestive heart failure) ([place]) Discussed her weight gain, leg swelling Recommended two Lasix for any noticeable leg swelling Recommended she moderate her fluid intake  Shortness of breath - Plan: EKG 12-Lead Denies having shortness of breath on exertion Recommended she take extra Lasix for worsening shortness of breath  Obstructive sleep apnea  Type 2 diabetes mellitus without complication, with long-term current use of insulin ([place]) We have encouraged continued exercise, careful diet management in an effort to lose weight.   Total encounter time more than 25 minutes  Greater than 50% was spent in counseling and coordination of care with the patient   Disposition:   F/U  6 months   Orders Placed This  Encounter  Procedures  . EKG 12-Lead     Signed, Esmond Plants, M.D., Ph.D. 01/03/2016  [place], [place]

## 2016-01-04 ENCOUNTER — Ambulatory Visit: Payer: Medicare Other | Admitting: Family Medicine

## 2016-01-07 ENCOUNTER — Ambulatory Visit (INDEPENDENT_AMBULATORY_CARE_PROVIDER_SITE_OTHER): Payer: Medicare Other

## 2016-01-07 DIAGNOSIS — I482 Chronic atrial fibrillation, unspecified: Secondary | ICD-10-CM

## 2016-01-07 LAB — POCT INR
AVAILABLE: 19.3
INR: 1.6

## 2016-01-07 NOTE — Patient Instructions (Signed)
Anticoagulation Dose Instructions as of 01/07/2016      Tracy Mann Tue Wed Thu Fri Sat   New Dose 4 mg 4 mg 4 mg 4 mg 4 mg 4 mg 4 mg    Description   Take 4 mg every day

## 2016-01-28 ENCOUNTER — Ambulatory Visit (INDEPENDENT_AMBULATORY_CARE_PROVIDER_SITE_OTHER): Payer: Medicare Other

## 2016-01-28 VITALS — BP 106/62 | HR 64 | Temp 97.9°F | Ht 65.0 in | Wt 242.5 lb

## 2016-01-28 DIAGNOSIS — I482 Chronic atrial fibrillation, unspecified: Secondary | ICD-10-CM

## 2016-01-28 DIAGNOSIS — Z0000 Encounter for general adult medical examination without abnormal findings: Secondary | ICD-10-CM | POA: Diagnosis not present

## 2016-01-28 LAB — POCT INR
INR: 2.6
PT: 30.9

## 2016-01-28 NOTE — Progress Notes (Signed)
Subjective:   Tracy Mann is a 82 y.o. female who presents for Medicare Annual (Subsequent) preventive examination.  Review of Systems:  N/A  Cardiac Risk Factors include: advanced age (>47men, >21 women);diabetes mellitus;dyslipidemia;obesity (BMI >30kg/m2)     Objective:     Vitals: BP 106/62 (BP Location: Right Arm)   Pulse 64   Temp 97.9 F (36.6 C) (Oral)   Ht 5\' 5"  (1.651 m)   Wt 242 lb 8 oz (110 kg)   BMI 40.35 kg/m   Body mass index is 40.35 kg/m.   Tobacco History  Smoking Status  . Never Smoker  Smokeless Tobacco  . Never Used     Counseling given: Not Answered   Past Medical History:  Diagnosis Date  . Acute myocardial infarction of inferior wall ([place])    a. 10/2005 - Presumed to be 2/2 to a coronary embolus in setting of persistent Afib-->Cath relatively nl cors, EF 45-50% w/ severe distal inferior/apical inferior HK w/ aneursymal appearance.  . Chronic anticoagulation    a. warfarin.  . Chronic atrial fibrillation (H[facility])    a. CHA2DS2VASc = 6--> warfarin.  . Chronic combined systolic and diastolic CHF (congestive heart failure) ([place])    a. 10/2012 Echo: EF 35-40%, diff HK, mild MR, mild biatrial enlargement;  b. 11/2012 EF 45-50% by LV gram; c. echo 07/2014: EF 40-45%, mod conc LVH, mod MR, severe biatrial enlargement, PASP 45 mm Hg  . Essential hypertension   . Hyperlipidemia, mixed   . Mixed Ischemic/Nonischemic Cardiomyopathy    a. 10/2012 Echo: EF 35-40%;  b. 11/2012 EF 45-50% by LV gram.  . Obesity   . Thyroid disease    hypothyroidism  . Type II diabetes mellitus ([place])   . Vitamin D deficiency    Past Surgical History:  Procedure Laterality Date  . CARDIAC CATHETERIZATION  11/2012   [facility];EF 45-50%  . CHOLECYSTECTOMY  1999  . TUBAL LIGATION  1968  . VESICOVAGINAL FISTULA CLOSURE W/ TAH  1996   Family History  Problem Relation Age of Onset  . Heart disease Mother   . Thyroid disease Father    History  Sexual Activity  . Sexual  activity: No    Outpatient Encounter Prescriptions as of 01/28/2016  Medication Sig  . ACCU-CHEK AVIVA PLUS test strip   . ezetimibe (ZETIA) 10 MG tablet Take 1 tablet (10 mg total) by mouth daily.  . fluticasone (FLONASE) 50 MCG/ACT nasal spray Place 2 sprays into both nostrils daily.  . furosemide (LASIX) 20 MG tablet Take 20 mg by mouth 2 (two) times daily as needed.  Marland Kitchen HYDROcodone-acetaminophen (NORCO) 10-325 MG tablet Take 1 tablet by mouth every 6 (six) hours as needed.  . insulin glargine (LANTUS) 100 UNIT/ML injection Inject 22 Units into the skin at bedtime.  . insulin lispro (HUMALOG) 100 UNIT/ML KiwkPen Inject up to 10 units three times daily before each meal as directed  . levothyroxine (SYNTHROID, LEVOTHROID) 125 MCG tablet Take 125 mcg by mouth daily before breakfast.  . lisinopril (PRINIVIL,ZESTRIL) 2.5 MG tablet Take 2.5 mg by mouth every evening.  Marland Kitchen LORazepam (ATIVAN) 1 MG tablet Take 1 tablet (1 mg total) by mouth 2 (two) times daily as needed for anxiety.  . Magnesium 500 MG CAPS Take 500 mg by mouth daily.  . metoprolol succinate (TOPROL-XL) 25 MG 24 hr tablet Take 1 tablet (25 mg total) by mouth 2 (two) times daily. (Patient taking differently: Take 12.5 mg by mouth daily. )  .  nitrofurantoin, macrocrystal-monohydrate, (MACROBID) 100 MG capsule Take 1 capsule (100 mg total) by mouth daily.  . nitroGLYCERIN (NITROSTAT) 0.4 MG SL tablet Place 1 tablet (0.4 mg total) under the tongue every 5 (five) minutes as needed. May repeat for up to 3 doses.  . phenazopyridine (PYRIDIUM) 100 MG tablet Take 100 mg by mouth as needed. Reported on 01/21/2015  . rOPINIRole (REQUIP) 1 MG tablet Take 0.25 mg by mouth at bedtime.   . vitamin B-12 (CYANOCOBALAMIN) 1000 MCG tablet Take 1,000 mcg by mouth daily.  . Vitamin D, Ergocalciferol, (DRISDOL) 50000 units CAPS capsule Take 50,000 Units by mouth.   . warfarin (COUMADIN) 4 MG tablet Take 1 tablet (4 mg total) by mouth daily.  . COMBIVENT  RESPIMAT 20-100 MCG/ACT AERS respimat 1 puff every 6 (six) hours as needed.   . warfarin (COUMADIN) 2 MG tablet Take 1 tablet (2 mg total) by mouth daily. (Patient not taking: Reported on 01/28/2016)  . warfarin (COUMADIN) 3 MG tablet TAKE 1 TABLET (3 MG TOTAL) BY MOUTH DAILY. (Patient not taking: Reported on 01/28/2016)   No facility-administered encounter medications on file as of 01/28/2016.     Activities of Daily Living In your present state of health, do you have any difficulty performing the following activities: 01/28/2016 05/20/2015  Hearing? Y N  Vision? Y N  Difficulty concentrating or making decisions? N N  Walking or climbing stairs? Y N  Dressing or bathing? N N  Doing errands, shopping? N N  Preparing Food and eating ? N -  Using the Toilet? N -  In the past six months, have you accidently leaked urine? Y -  Do you have problems with loss of bowel control? N -  Managing your Medications? N -  Managing your Finances? N -  Housekeeping or managing your Housekeeping? N -  Some recent data might be hidden    Patient Care Team: Jerrol Banana., MD as PCP - General (Family Medicine) Minna Merritts, MD as Consulting Physician (Cardiology) Elease Etienne, MD as Consulting Physician (Internal Medicine)    Assessment:     Exercise Activities and Dietary recommendations Current Exercise Habits: The patient does not participate in regular exercise at present, Exercise limited by: orthopedic condition(s) (both leg pain)  Goals    . Weight (lb) < 200 lb (90.7 kg)          Starting 01/28/16, I will focus on losing 10-15 lbs by the end of February 2018.       Fall Risk Fall Risk  01/28/2016 08/20/2014 07/08/2014  Falls in the past year? No No No   Depression Screen PHQ 2/9 Scores 01/28/2016 08/20/2014  PHQ - 2 Score 1 1     Cognitive Function     6CIT Screen 01/28/2016  What Year? 0 points  What month? 0 points  What time? 0 points  Count back from 20 0  points  Months in reverse 0 points  Repeat phrase 2 points  Total Score 2     There is no immunization history on file for this patient. Screening Tests Health Maintenance  Topic Date Due  . FOOT EXAM  01/31/2016 (Originally 05/23/1943)  . DEXA SCAN  01/31/2016 (Originally 05/23/2000)  . INFLUENZA VACCINE  08/30/2016 (Originally 08/31/2015)  . TETANUS/TDAP  01/27/2026 (Originally 05/24/1952)  . HEMOGLOBIN A1C  04/30/2016  . OPHTHALMOLOGY EXAM  07/14/2016  . ZOSTAVAX  Completed  . PNA vac Low Risk Adult  Completed  Plan:  I have personally reviewed and addressed the Medicare Annual Wellness questionnaire and have noted the following in the patient's chart:  A. Medical and social history B. Use of alcohol, tobacco or illicit drugs  C. Current medications and supplements D. Functional ability and status E.  Nutritional status F.  Physical activity G. Advance directives H. List of other physicians I.  Hospitalizations, surgeries, and ER visits in previous 12 months J.  [place] such as hearing and vision if needed, cognitive and depression L. Referrals and appointments - none  In addition, I have reviewed and discussed with patient certain preventive protocols, quality metrics, and best practice recommendations. A written personalized care plan for preventive services as well as general preventive health recommendations were provided to patient.  See attached scanned questionnaire for additional information.   Signed,  Fabio Neighbors, LPN Nurse Health Advisor   MD Recommendations: Follow up on diabetic foot exam, bone density scan and tetanus. Pt denied all today,  I have reviewed the health advisors note, was  available for consultation and I agree with documentation and plan. Miguel Aschoff MD [facility]

## 2016-01-28 NOTE — Patient Instructions (Addendum)
Tracy Mann , Thank you for taking time to come for your Medicare Wellness Visit. I appreciate your ongoing commitment to your health goals. Please review the following plan we discussed and let me know if I can assist you in the future.   These are the goals we discussed: Goals    . Weight (lb) < 200 lb (90.7 kg)          Starting 01/28/16, I will focus on losing 10-15 lbs by the end of February 2018.        This is a list of the screening recommended for you and due dates:  Health Maintenance  Topic Date Due  . Complete foot exam   05/23/1943  . Tetanus Vaccine  05/24/1954  . Shingles Vaccine  05/24/1995  . DEXA scan (bone density measurement)  05/23/2000  . Pneumonia vaccines (1 of 2 - PCV13) 05/23/2000  . Hemoglobin A1C  03/11/2009  . Flu Shot  08/31/2015  . Eye exam for diabetics  07/14/2016   Preventive Care for Adults  A healthy lifestyle and preventive care can promote health and wellness. Preventive health guidelines for adults include the following key practices.  . A routine yearly physical is a good way to check with your health care provider about your health and preventive screening. It is a chance to share any concerns and updates on your health and to receive a thorough exam.  . Visit your dentist for a routine exam and preventive care every 6 months. Brush your teeth twice a day and floss once a day. Good oral hygiene prevents tooth decay and gum disease.  . The frequency of eye exams is based on your age, health, family medical history, use  of contact lenses, and other factors. Follow your health care provider's ecommendations for frequency of eye exams.  . Eat a healthy diet. Foods like vegetables, fruits, whole grains, low-fat dairy products, and lean protein foods contain the nutrients you need without too many calories. Decrease your intake of foods high in solid fats, added sugars, and salt. Eat the right amount of calories for you. Get information about a  proper diet from your health care provider, if necessary.  . Regular physical exercise is one of the most important things you can do for your health. Most adults should get at least 150 minutes of moderate-intensity exercise (any activity that increases your heart rate and causes you to sweat) each week. In addition, most adults need muscle-strengthening exercises on 2 or more days a week.  Silver Sneakers may be a benefit available to you. To determine eligibility, you may visit the website: www.silversneakers.com or contact program at 820-411-8485 Mon-Fri between 8AM-8PM.   . Maintain a healthy weight. The body mass index (BMI) is a screening tool to identify possible weight problems. It provides an estimate of body fat based on height and weight. Your health care provider can find your BMI and can help you achieve or maintain a healthy weight.   For adults 20 years and older: ? A BMI below 18.5 is considered underweight. ? A BMI of 18.5 to 24.9 is normal. ? A BMI of 25 to 29.9 is considered overweight. ? A BMI of 30 and above is considered obese.   . Maintain normal blood lipids and cholesterol levels by exercising and minimizing your intake of saturated fat. Eat a balanced diet with plenty of fruit and vegetables. Blood tests for lipids and cholesterol should begin at age 82 and be repeated every  5 years. If your lipid or cholesterol levels are high, you are over 50, or you are at high risk for heart disease, you may need your cholesterol levels checked more frequently. Ongoing high lipid and cholesterol levels should be treated with medicines if diet and exercise are not working.  . If you smoke, find out from your health care provider how to quit. If you do not use tobacco, please do not start.  . If you choose to drink alcohol, please do not consume more than 2 drinks per day. One drink is considered to be 12 ounces (355 mL) of beer, 5 ounces (148 mL) of wine, or 1.5 ounces (44 mL) of  liquor.  . If you are 7-49 years old, ask your health care provider if you should take aspirin to prevent strokes.  . Use sunscreen. Apply sunscreen liberally and repeatedly throughout the day. You should seek shade when your shadow is shorter than you. Protect yourself by wearing long sleeves, pants, a wide-brimmed hat, and sunglasses year round, whenever you are outdoors.  . Once a month, do a whole body skin exam, using a mirror to look at the skin on your back. Tell your health care provider of new moles, moles that have irregular borders, moles that are larger than a pencil eraser, or moles that have changed in shape or color.   Diet for Type 2 Diabetes Mellitus Type 2 diabetes (type 2 diabetes mellitus) is a long-term (chronic) disease that affects blood sugar (glucose) levels. Normally, a hormone called insulin allows glucose to enter cells in the body. The cells use glucose for energy. In type 2 diabetes, one or both of these problems may be present:  The body does not make enough insulin.  The body does not respond properly to insulin that it makes (insulin resistance). Insulin resistance or lack of insulin causes excess glucose to build up in the blood instead of going into cells. As a result, high blood glucose (hyperglycemia) develops, which can cause many complications. Being overweight or obese and having an inactive (sedentary) lifestyle can increase your risk for diabetes. Type 2 diabetes can be delayed or prevented by making certain nutrition and lifestyle changes. What nutrition changes can be made?  Eat healthy meals and snacks regularly. Keep a healthy snack with you for when you get hungry between meals, such as fruit or a handful of nuts.  Eat lean meats and proteins that are low in saturated fats, such as chicken, fish, egg whites, and beans. Avoid processed meats.  Eat plenty of fruits and vegetables and plenty of grains that have not been processed (whole grains). It  is recommended that you eat:  1?2 cups of fruit every day.  2?3 cups of vegetables every day.  6?8 oz of whole grains every day, such as oats, whole wheat, bulgur, brown rice, quinoa, and millet.  Eat low-fat dairy products, such as milk, yogurt, and cheese.  Eat foods that contain healthy fats, such as nuts, avocado, olive oil, and canola oil.  Drink water throughout the day. Avoid drinks that contain added sugar, such as soda or sweet tea.  Follow instructions from your health care provider about specific eating or drinking restrictions.  Control how much food you eat at a time (portion size).  Check food labels to find out the serving sizes of foods.  Use a kitchen scale to weigh amounts of foods.  Saute or steam food instead of frying it. Cook with water or broth instead  of oils or butter.  Limit your intake of:  Salt (sodium). Have no more than 1 tsp (2,400 mg) of sodium a day. If you have heart disease or high blood pressure, have less than ? tsp (1,500 mg) of sodium a day.  Saturated fat. This is fat that is solid at room temperature, such as butter or fat on meat. What lifestyle changes can be made?  Activity  Do moderate-intensity physical activity for at least 30 minutes on at least 5 days of the week, or as much as told by your health care provider.  Ask your health care provider what activities are safe for you. A mix of physical activities may be best, such as walking, swimming, cycling, and strength training.  Try to add physical activity into your day. For example:  Park in spots that are farther away than usual, so that you walk more. For example, park in a far corner of the parking lot when you go to the office or the grocery store.  Take a walk during your lunch break.  Use stairs instead of elevators or escalators. Weight Loss  Lose weight as directed. Your health care provider can determine how much weight loss is best for you and can help you lose  weight safely.  If you are overweight or obese, you may be instructed to lose at least 5?7 % of your body weight. Alcohol and Tobacco   Limit alcohol intake to no more than 1 drink a day for nonpregnant women and 2 drinks a day for men. One drink equals 12 oz of beer, 5 oz of wine, or 1 oz of hard liquor.  Do not use any tobacco products, such as cigarettes, chewing tobacco, and e-cigarettes. If you need help quitting, ask your health care provider. Work With [facility] Provider  Have your blood glucose tested regularly, as told by your health care provider.  Discuss your risk factors and how you can reduce your risk for diabetes.  Get screening tests as told by your health care provider. You may have screening tests regularly, especially if you have certain risk factors for type 2 diabetes.  Make an appointment with a diet and nutrition specialist (registered dietitian). A registered dietitian can help you make a healthy eating plan and can help you understand portion sizes and food labels. Why are these changes important?  It is possible to prevent or delay type 2 diabetes and related health problems by making lifestyle and nutrition changes.  It can be difficult to recognize signs of type 2 diabetes. The best way to avoid possible damage to your body is to take actions to prevent the disease before you develop symptoms. What can happen if changes are not made?  Your blood glucose levels may keep increasing. Having high blood glucose for a long time is dangerous. Too much glucose in your blood can damage your blood vessels, heart, kidneys, nerves, and eyes.  You may develop prediabetes or type 2 diabetes. Type 2 diabetes can lead to many chronic health problems and complications, such as:  Heart disease.  Stroke.  Blindness.  Kidney disease.  Depression.  Poor circulation in the feet and legs, which could lead to surgical removal (amputation) in severe cases. Where to  find support:  Ask your health care provider to recommend a registered dietitian, diabetes educator, or weight loss program.  Look for local or online weight loss groups.  Join a gym, fitness club, or outdoor activity group, such as  a walking club. Where to find more information: To learn more about diabetes and diabetes prevention, visit:  American Diabetes Association (ADA): www.diabetes.[employer] of Diabetes and Digestive and Kidney Diseases: FindSpin.nl To learn more about healthy eating, visit:  The U.S. Department of Agriculture Scientist, research (physical sciences)), Choose My Plate: http://wiley-williams.com/  Office of Disease Prevention and Health Promotion (ODPHP), Dietary Guidelines: SurferLive.at Summary  You can reduce your risk for type 2 diabetes by increasing your physical activity, eating healthy foods, and losing weight as directed.  Talk with your health care provider about your risk for type 2 diabetes. Ask about any blood tests or screening tests that you need to have. This information is not intended to replace advice given to you by your health care provider. Make sure you discuss any questions you have with your health care provider. Document Released: 05/10/2015 Document Revised: 06/24/2015 Document Reviewed: 03/09/2015 Elsevier Interactive Patient Education  2017 [place].  Anticoagulation Dose Instructions as of 01/28/2016      Tracy Mann Tue Wed Thu Fri Sat   New Dose 4 mg 4 mg 4 mg 4 mg 4 mg 4 mg 4 mg    Description   Take 4 mg every day

## 2016-02-07 DIAGNOSIS — L570 Actinic keratosis: Secondary | ICD-10-CM | POA: Diagnosis not present

## 2016-02-07 DIAGNOSIS — L821 Other seborrheic keratosis: Secondary | ICD-10-CM | POA: Diagnosis not present

## 2016-02-07 DIAGNOSIS — L578 Other skin changes due to chronic exposure to nonionizing radiation: Secondary | ICD-10-CM | POA: Diagnosis not present

## 2016-02-07 DIAGNOSIS — D485 Neoplasm of uncertain behavior of skin: Secondary | ICD-10-CM | POA: Diagnosis not present

## 2016-02-07 DIAGNOSIS — L814 Other melanin hyperpigmentation: Secondary | ICD-10-CM | POA: Diagnosis not present

## 2016-02-22 DIAGNOSIS — Z794 Long term (current) use of insulin: Secondary | ICD-10-CM | POA: Diagnosis not present

## 2016-02-22 DIAGNOSIS — E039 Hypothyroidism, unspecified: Secondary | ICD-10-CM | POA: Diagnosis not present

## 2016-02-22 DIAGNOSIS — E1165 Type 2 diabetes mellitus with hyperglycemia: Secondary | ICD-10-CM | POA: Diagnosis not present

## 2016-02-22 DIAGNOSIS — E559 Vitamin D deficiency, unspecified: Secondary | ICD-10-CM | POA: Diagnosis not present

## 2016-02-22 LAB — HEMOGLOBIN A1C: Hemoglobin A1C: 6.6

## 2016-02-23 ENCOUNTER — Telehealth: Payer: Self-pay | Admitting: Cardiovascular Disease

## 2016-02-23 NOTE — Telephone Encounter (Signed)
Patient dropped off handicapped placard renewal form. Please call when ready for pick up.  Placed in nurse box.

## 2016-02-23 NOTE — Telephone Encounter (Signed)
Left message for pt that I am leaving signed placard form at the front desk for her to p/u at her convenience. Asked her to call back if she would like for [place] to mail it to her.

## 2016-02-28 ENCOUNTER — Other Ambulatory Visit: Payer: Self-pay | Admitting: Family Medicine

## 2016-02-28 NOTE — Telephone Encounter (Signed)
Rx faxed to [facility]. Thanks TNP

## 2016-03-01 ENCOUNTER — Ambulatory Visit: Payer: Medicare Other

## 2016-03-03 ENCOUNTER — Ambulatory Visit (INDEPENDENT_AMBULATORY_CARE_PROVIDER_SITE_OTHER): Payer: Medicare Other

## 2016-03-03 DIAGNOSIS — I482 Chronic atrial fibrillation, unspecified: Secondary | ICD-10-CM

## 2016-03-03 LAB — POCT INR
INR: 1.9
PT: 22.6

## 2016-03-03 NOTE — Patient Instructions (Signed)
Anticoagulation Dose Instructions as of 03/03/2016      Tracy Mann Tue Wed Thu Fri Sat   New Dose 4 mg 4 mg 4 mg 4 mg 4 mg 6 mg 4 mg    Description   Take 6 mg today, the continue to take 4 mg every day

## 2016-03-16 ENCOUNTER — Other Ambulatory Visit: Payer: Self-pay | Admitting: Family Medicine

## 2016-03-17 ENCOUNTER — Other Ambulatory Visit: Payer: Self-pay | Admitting: Family Medicine

## 2016-03-28 ENCOUNTER — Ambulatory Visit (INDEPENDENT_AMBULATORY_CARE_PROVIDER_SITE_OTHER): Payer: Medicare Other | Admitting: Family Medicine

## 2016-03-28 VITALS — BP 128/62 | HR 66 | Temp 98.0°F | Resp 16 | Ht 65.0 in | Wt 234.0 lb

## 2016-03-28 DIAGNOSIS — M150 Primary generalized (osteo)arthritis: Secondary | ICD-10-CM

## 2016-03-28 DIAGNOSIS — M1990 Unspecified osteoarthritis, unspecified site: Secondary | ICD-10-CM

## 2016-03-28 DIAGNOSIS — Z0000 Encounter for general adult medical examination without abnormal findings: Secondary | ICD-10-CM

## 2016-03-28 DIAGNOSIS — E7849 Other hyperlipidemia: Secondary | ICD-10-CM

## 2016-03-28 DIAGNOSIS — M159 Polyosteoarthritis, unspecified: Secondary | ICD-10-CM

## 2016-03-28 DIAGNOSIS — E784 Other hyperlipidemia: Secondary | ICD-10-CM

## 2016-03-28 DIAGNOSIS — I482 Chronic atrial fibrillation, unspecified: Secondary | ICD-10-CM

## 2016-03-28 DIAGNOSIS — Z5320 Procedure and treatment not carried out because of patient's decision for unspecified reasons: Secondary | ICD-10-CM | POA: Diagnosis not present

## 2016-03-28 LAB — POCT INR
INR: 2.9
PT: 35.3

## 2016-03-28 MED ORDER — HYDROCODONE-ACETAMINOPHEN 10-325 MG PO TABS: 1.0000 | ORAL_TABLET | Freq: Four times a day (QID) | ORAL | 0 refills | Status: DC | PRN

## 2016-03-28 NOTE — Progress Notes (Signed)
Patient: Tracy Mann, Female    DOB: 02/20/1933, 83 y.o.   MRN: [id number] Visit Date: 03/28/2016  Today's Provider: Wilhemena Durie, MD   Chief Complaint  Patient presents with  . Annual Exam   Subjective:  Tracy Mann is a 83 y.o. female who presents today for health maintenance and complete physical. She feels fairly well. She reports exercising some walking around the house for about 15 minutes daily. She reports she is sleeping fairly well. No new issues today. C/o chronic pain. Immunization History  Administered Date(s) Administered  . Pneumococcal Conjugate-13 12/08/2013  . Pneumococcal Polysaccharide-23 11/19/2012  Allergic to flu shots. Last BMD 09/16/13 osteopenia. Declines Colonoscopy and mammogram. She had hysterectomy in the past. Review of Systems  Constitutional: Negative.   HENT: Positive for postnasal drip and tinnitus.   Eyes: Negative.   Respiratory: Negative.   Cardiovascular: Positive for palpitations and leg swelling.  Gastrointestinal: Positive for abdominal distention.  Endocrine: Negative.   Genitourinary: Negative.   Musculoskeletal: Positive for arthralgias and back pain.  Skin: Negative.   Allergic/Immunologic: Positive for environmental allergies.  Neurological: Negative.   Hematological: Negative.   Psychiatric/Behavioral: Negative.     Social History   Social History  . Marital status: Widowed    Spouse name: widowed  . Number of children: 4  . Years of education: 14   Occupational History  . retired    Social History Main Topics  . Smoking status: Never Smoker  . Smokeless tobacco: Never Used  . Alcohol use No  . Drug use: No  . Sexual activity: No   Other Topics Concern  . Not on file   Social History Narrative   Widowed   Has children and grandchildren   Lives in [place]    Patient Active Problem List   Diagnosis Date Noted  . Angina pectoris ([place]) 06/07/2015  . Angina at rest [facility]) 05/20/2015  . Weakness  03/03/2015  . Mixed Ischemic/Nonischemic Cardiomyopathy   . Type II diabetes mellitus ([place])   . Chronic atrial fibrillation ([place])   . Chronic combined systolic and diastolic CHF (congestive heart failure) ([place])   . Chronic anticoagulation   . Absolute anemia 07/08/2014  . Airway hyperreactivity 07/08/2014  . Cervical muscle strain 07/08/2014  . PNA (pneumonia) 07/08/2014  . Chest pressure 07/08/2014  . Clinical depression 07/08/2014  . Degeneration of lumbar or lumbosacral intervertebral disc 07/08/2014  . Accumulation of fluid in tissues 07/08/2014  . Anxiety, generalized 07/08/2014  . Acid reflux 07/08/2014  . Folliculitis [id number]  . Adult hypothyroidism 07/08/2014  . Broken leg 07/08/2014  . Cannot sleep 07/08/2014  . Obstructive sleep apnea 07/08/2014  . Arthritis, degenerative 07/08/2014  . Onychia of finger 07/08/2014  . Psoriasis 07/08/2014  . Restless legs syndrome 07/08/2014  . Adult BMI 30+ 07/08/2014  . Calculus of kidney 10/31/2011  . Bladder infection, chronic 10/31/2011  . Incomplete bladder emptying 10/31/2011  . Obesity 11/02/2010  . SHORTNESS OF BREATH 02/01/2010  . Hyperlipidemia 09/16/2008  . CAD, NATIVE VESSEL 09/16/2008  . Atrial fibrillation ([place]) 09/16/2008    Past Surgical History:  Procedure Laterality Date  . CARDIAC CATHETERIZATION  11/2012   [facility];EF 45-50%  . CHOLECYSTECTOMY  1999  . TUBAL LIGATION  1968  . VESICOVAGINAL FISTULA CLOSURE W/ TAH  1996    Her family history includes Heart disease in her mother; Thyroid disease in her father.     Outpatient Encounter Prescriptions as of 03/28/2016  Medication Sig  . ACCU-CHEK  AVIVA PLUS test strip   . COMBIVENT RESPIMAT 20-100 MCG/ACT AERS respimat 1 puff every 6 (six) hours as needed.   . ezetimibe (ZETIA) 10 MG tablet Take 1 tablet (10 mg total) by mouth daily.  . fluticasone (FLONASE) 50 MCG/ACT nasal spray Place 2 sprays into both nostrils daily.  . furosemide (LASIX) 20 MG tablet  Take 20 mg by mouth 2 (two) times daily as needed.  Marland Kitchen HYDROcodone-acetaminophen (NORCO) 10-325 MG tablet Take 1 tablet by mouth every 6 (six) hours as needed.  . insulin glargine (LANTUS) 100 UNIT/ML injection Inject 22 Units into the skin at bedtime.  . insulin lispro (HUMALOG) 100 UNIT/ML KiwkPen Inject up to 10 units three times daily before each meal as directed  . levothyroxine (SYNTHROID, LEVOTHROID) 125 MCG tablet Take 125 mcg by mouth daily before breakfast.  . lisinopril (PRINIVIL,ZESTRIL) 2.5 MG tablet Take 2.5 mg by mouth every evening.  Marland Kitchen LORazepam (ATIVAN) 1 MG tablet TAKE 1 TABLET BY MOUTH TWICE DAILY AS NEEDED FOR ANXIETY  . Magnesium 500 MG CAPS Take 500 mg by mouth daily.  . metoprolol succinate (TOPROL-XL) 25 MG 24 hr tablet Take 1 tablet (25 mg total) by mouth 2 (two) times daily. (Patient taking differently: Take 12.5 mg by mouth daily. )  . nitrofurantoin, macrocrystal-monohydrate, (MACROBID) 100 MG capsule Take 1 capsule (100 mg total) by mouth daily.  . nitroGLYCERIN (NITROSTAT) 0.4 MG SL tablet Place 1 tablet (0.4 mg total) under the tongue every 5 (five) minutes as needed. May repeat for up to 3 doses.  . phenazopyridine (PYRIDIUM) 100 MG tablet Take 100 mg by mouth as needed. Reported on 01/21/2015  . rOPINIRole (REQUIP) 1 MG tablet Take 0.25 mg by mouth at bedtime.   . vitamin B-12 (CYANOCOBALAMIN) 1000 MCG tablet Take 1,000 mcg by mouth daily.  . Vitamin D, Ergocalciferol, (DRISDOL) 50000 units CAPS capsule Take 50,000 Units by mouth.   . warfarin (COUMADIN) 2 MG tablet Take 1 tablet (2 mg total) by mouth daily.  Marland Kitchen warfarin (COUMADIN) 3 MG tablet TAKE 1 TABLET (3 MG TOTAL) BY MOUTH DAILY.  Marland Kitchen warfarin (COUMADIN) 4 MG tablet Take 1 tablet (4 mg total) by mouth daily.  . [DISCONTINUED] furosemide (LASIX) 20 MG tablet TAKE 2 TABLETS ONCE DAILY  . [DISCONTINUED] lisinopril (PRINIVIL,ZESTRIL) 2.5 MG tablet TAKE 1 TABLET BY MOUTH AT BEDTIME   No facility-administered  encounter medications on file as of 03/28/2016.     Patient Care Team: Jerrol Banana., MD as PCP - General (Family Medicine) Minna Merritts, MD as Consulting Physician (Cardiology) Elease Etienne, MD as Consulting Physician (Internal Medicine)      Objective:   Vitals:  Vitals:   03/28/16 1437  BP: 128/62  Pulse: 66  Resp: 16  Temp: 98 F (36.7 C)  Weight: 234 lb (106.1 kg)  Height: 5\' 5"  (1.651 m)    Physical Exam  Constitutional: She is oriented to person, place, and time. She appears well-developed and well-nourished.  HENT:  Head: Normocephalic and atraumatic.  Right Ear: External ear normal.  Left Ear: External ear normal.  Nose: Nose normal.  Mouth/Throat: Oropharynx is clear and moist.  Patient has upper and lower dentures in place since age 35.  Eyes: Conjunctivae are normal. Pupils are equal, round, and reactive to light.  Neck: Neck supple.  Cardiovascular: Normal rate, regular rhythm, normal heart sounds and intact distal pulses.   No murmur heard. Pulmonary/Chest: Effort normal and breath sounds normal. No respiratory distress.  She has no wheezes.  Abdominal: Soft.  Genitourinary:  Genitourinary Comments: Patient declines breast , pelvic exam, rectal exam.  Musculoskeletal: She exhibits edema (trace).  Trace.  Neurological: She is alert and oriented to person, place, and time.  Skin: Skin is warm and dry.  Very fair skin.  Psychiatric: She has a normal mood and affect. Her behavior is normal. Judgment and thought content normal.     Depression Screen PHQ 2/9 Scores 01/28/2016 08/20/2014  PHQ - 2 Score 1 1      Assessment & Plan:     Routine Health Maintenance and Physical Exam  Exercise Activities and Dietary recommendations Goals    . Weight (lb) < 200 lb (90.7 kg)          Starting 01/28/16, I will focus on losing 10-15 lbs by the end of February 2018.      1. Annual physical exam Order CBC, Cmet and Lipid. TSH and A1c done in  January 2018 with [place] endocrinologist.  2. Mammogram declined  3. Colonoscopy refused  4. Osteoarthritis, unspecified osteoarthritis type, unspecified site  5. Chronic atrial fibrillation (HCC) INR 2.9 today. Continue 4 mg daily. Re check in 1 month. - POCT INR - CBC w/Diff/Platelet  6. Primary osteoarthritis involving multiple joints Refills provided. Patient advised that I would like her to eventually get off of narcotics. - HYDROcodone-acetaminophen (NORCO) 10-325 MG tablet; Take 1 tablet by mouth every 6 (six) hours as needed.  Dispense: 120 tablet; Refill: 0 - HYDROcodone-acetaminophen (NORCO) 10-325 MG tablet; Take 1 tablet by mouth every 6 (six) hours as needed.  Dispense: 120 tablet; Refill: 0  7. Other hyperlipidemia - Comprehensive metabolic panel - Lipid Panel With LDL/HDL Ratio  HPI, Exam and A&P transcribed under direction and in the presence of Miguel Aschoff, MD. I have done the exam and reviewed the chart and it is accurate to the best of my knowledge. Development worker, community has been used and  any errors in dictation or transcription are unintentional. Miguel Aschoff M.D. [facility]

## 2016-03-28 NOTE — Patient Instructions (Signed)
Anticoagulation Dose Instructions as of 03/28/2016      Tracy Mann Tue Wed Thu Fri Sat   New Dose 4 mg 4 mg 4 mg 4 mg 4 mg 4 mg 4 mg    Description   Continue to take 4 mg every day

## 2016-03-31 ENCOUNTER — Ambulatory Visit: Payer: Self-pay

## 2016-04-04 DIAGNOSIS — E784 Other hyperlipidemia: Secondary | ICD-10-CM | POA: Diagnosis not present

## 2016-04-04 DIAGNOSIS — I482 Chronic atrial fibrillation: Secondary | ICD-10-CM | POA: Diagnosis not present

## 2016-04-05 ENCOUNTER — Telehealth: Payer: Self-pay | Admitting: Emergency Medicine

## 2016-04-05 LAB — CBC WITH DIFFERENTIAL/PLATELET
BASOS: 0 %
Basophils Absolute: 0 10*3/uL (ref 0.0–0.2)
EOS (ABSOLUTE): 0.3 10*3/uL (ref 0.0–0.4)
Eos: 3 %
Hematocrit: 40.7 % (ref 34.0–46.6)
Hemoglobin: 13.1 g/dL (ref 11.1–15.9)
IMMATURE GRANS (ABS): 0 10*3/uL (ref 0.0–0.1)
Immature Granulocytes: 0 %
Lymphocytes Absolute: 3.5 10*3/uL — ABNORMAL HIGH (ref 0.7–3.1)
Lymphs: 39 %
MCH: 29 pg (ref 26.6–33.0)
MCHC: 32.2 g/dL (ref 31.5–35.7)
MCV: 90 fL (ref 79–97)
MONOS ABS: 0.6 10*3/uL (ref 0.1–0.9)
Monocytes: 7 %
NEUTROS ABS: 4.4 10*3/uL (ref 1.4–7.0)
Neutrophils: 51 %
PLATELETS: 261 10*3/uL (ref 150–379)
RBC: 4.51 x10E6/uL (ref 3.77–5.28)
RDW: 13.5 % (ref 12.3–15.4)
WBC: 8.9 10*3/uL (ref 3.4–10.8)

## 2016-04-05 LAB — COMPREHENSIVE METABOLIC PANEL
A/G RATIO: 1.4 (ref 1.2–2.2)
ALT: 16 IU/L (ref 0–32)
AST: 38 IU/L (ref 0–40)
Albumin: 3.8 g/dL (ref 3.5–4.7)
Alkaline Phosphatase: 99 IU/L (ref 39–117)
BILIRUBIN TOTAL: 0.6 mg/dL (ref 0.0–1.2)
BUN/Creatinine Ratio: 20 (ref 12–28)
BUN: 17 mg/dL (ref 8–27)
CO2: 28 mmol/L (ref 18–29)
Calcium: 9.1 mg/dL (ref 8.7–10.3)
Chloride: 100 mmol/L (ref 96–106)
Creatinine, Ser: 0.84 mg/dL (ref 0.57–1.00)
GFR calc non Af Amer: 66 mL/min/{1.73_m2} (ref >59)
GFR, EST AFRICAN AMERICAN: 76 mL/min/{1.73_m2} (ref >59)
Globulin, Total: 2.8 g/dL (ref 1.5–4.5)
Glucose: 104 mg/dL — ABNORMAL HIGH (ref 65–99)
POTASSIUM: 5.4 mmol/L — AB (ref 3.5–5.2)
SODIUM: 141 mmol/L (ref 134–144)
TOTAL PROTEIN: 6.6 g/dL (ref 6.0–8.5)

## 2016-04-05 LAB — LIPID PANEL WITH LDL/HDL RATIO
Cholesterol, Total: 162 mg/dL (ref 100–199)
HDL: 40 mg/dL (ref >39)
LDL Calculated: 85 mg/dL (ref 0–99)
LDl/HDL Ratio: 2.1 ratio units (ref 0.0–3.2)
Triglycerides: 186 mg/dL — ABNORMAL HIGH (ref 0–149)
VLDL Cholesterol Cal: 37 mg/dL (ref 5–40)

## 2016-04-05 NOTE — Telephone Encounter (Signed)
-----   Message from Jerrol Banana., MD sent at 04/05/2016  8:17 AM EST ----- Labs OK.

## 2016-04-05 NOTE — Telephone Encounter (Signed)
LMTCB

## 2016-04-05 NOTE — Telephone Encounter (Signed)
Pt informed and voiced understanding of results. 

## 2016-04-22 ENCOUNTER — Other Ambulatory Visit: Payer: Self-pay | Admitting: Family Medicine

## 2016-04-25 ENCOUNTER — Other Ambulatory Visit: Payer: Self-pay | Admitting: Family Medicine

## 2016-04-28 ENCOUNTER — Ambulatory Visit (INDEPENDENT_AMBULATORY_CARE_PROVIDER_SITE_OTHER): Payer: Medicare Other | Admitting: *Deleted

## 2016-04-28 ENCOUNTER — Other Ambulatory Visit: Payer: Self-pay | Admitting: *Deleted

## 2016-04-28 DIAGNOSIS — I482 Chronic atrial fibrillation, unspecified: Secondary | ICD-10-CM

## 2016-04-28 LAB — POCT INR
INR: 3.4
PT: 40.8

## 2016-04-28 NOTE — Telephone Encounter (Signed)
Either is OK

## 2016-04-28 NOTE — Patient Instructions (Signed)
Take 4 mg every day. Return in 2 weeks.

## 2016-04-28 NOTE — Telephone Encounter (Signed)
Patient was in office for PT clinic. Patient wanted to let Dr. Rosanna Randy know she is going to start taking herbal supplements right after Easter. Patient wanted to have her PT checked next week, after starting supplements. But patient was advised to wait until 2 weeks. Patient wants to make sure this is ok?

## 2016-04-28 NOTE — Telephone Encounter (Signed)
Please review. Thanks!  

## 2016-05-01 NOTE — Telephone Encounter (Signed)
LMTCB ED 

## 2016-05-02 NOTE — Telephone Encounter (Signed)
Patient advised as below and verbally voiced understanding. Patient plans to start taking supplements today and will keep her scheduled PT/INR check on 05/12/2016.

## 2016-05-10 ENCOUNTER — Telehealth: Payer: Self-pay | Admitting: Cardiovascular Disease

## 2016-05-10 NOTE — Telephone Encounter (Signed)
Pt calling stating over the past month she's noticed she gets more SOB when she goes up and down her drive way.  She states she is okay when she is on leveled ground  Would like some advise  She is scheduled to see [place] in June.

## 2016-05-10 NOTE — Telephone Encounter (Signed)
She can come in sooner to discuss w/ Dr. Rockey Situ. Looks like he has some openings in the next few days if she would like one of those.

## 2016-05-10 NOTE — Progress Notes (Signed)
Cardiology Office Note  Date:  05/11/2016   ID:  Owen, Pagnotta 18-Apr-1933, MRN [id number]  PCP:  Wilhemena Durie, MD   Chief Complaint  Patient presents with  . other    6 month follow up. Meds reviewed by the pt. verbally. Pt. c/o LE edema and shortness of breath.     HPI:  Ms. Anzaldo is a 83 year old woman with a PMH significant for  chronic atrial fibrillation on Coumadin therapy,  diastolic dysfunction, EF 40 to 45%  hypertension,  hyperlipidemia,  morbid obesity,   moderate pulmonary hypertension inferior wall myocardial infarction felt to be secondary to a thrombus from atrial fib. in October 2007 at [facility],   episodes of chest pain  who presents for followup Of her coronary artery disease and leg edema.  In follow-up today her abdomen feels tight, slight leg edema, shortness of breath particular going up hills or stairs, has been eating out, high fluid intake Takes Lasix every other day sometimes Was monitoring her oxygen level at home and noted she was in the 80s  No regular exercise program, and Walking with a walker For longer distances No recent falls. Denies any chest pain, palpitations  Eats lots of bananas  Rare episodes of sharp fleeting left-sided chest pain, not typically associated with exertion Does not take nitroglycerin as does not last very long  Labwork discussed with her HBA1C  6.6, down from 8 Potassium 5.4 TSH 11, Vit D 17. Takes vitamin D supplement Total chol 162, LDL 85  EKG personally reviewed by myself on todays visit shows atrial fibrillation with ventricular rate 77 bpm, left axis deviation  Other past medical history reviewed In follow-up today, she reports that she had an episode of chest pain 05/21/2015 on the left side She went to the emergency room, was admitted. Hospital records reviewed. Cardiac enzymes were unrevealing, EKG unchanged. She had stress Myoview showing old inferior MI with no ischemia  consistent with her known anatomy. She did not take nitroglycerin for her symptoms.  Previously seen by vein and vascular, Dr. Ronalee Belts, recommended to wear TED hose or ACE wraps It was mentioned that perhaps she could wear lymphedema compression pump. She does not have this yet She does sit down during the daytime with her legs down, unable to get her legs up very high Difficulty touching her toes, unable to put compression hose on  diabetes, currently on insulin. Followed by Dr. Blossom Hoops. Previous echocardiogram showing ejection fraction 35-40% in 2014 Follow-up cardiac catheterization showing ejection fraction 45-50%  Continued chronic knee problems, difficulty walking long distances  Other past medical history On a previous visit, she had worsening shortness of breath, poor energy.  Echocardiogram was done that showed moderately depressed ejection fraction of 35%. Previously she had been 50%. She felt poorly  with shortness of breath and fatigue with any exertion.   Cardiac catheterization was performed to rule out ischemia as a cause of her depressed ejection fraction. This was done on 12/05/2012. She had a right dominant coronary arterial system with mild luminal irregularities, no significant stenoses that could contribute to her depressed ejection fraction. EF was estimated at 45%. There was an aneurysmal/severely hypokinetic region in the distal inferior and apical inferior wall from old MI 2007.   Previous  echocardiogram several years ago showed ejection fraction 50%. Moderately elevated right ventricular systolic pressures. Previous 48-hour Holter monitor, which showed that she was in persistent atrial fibrillation with one episode of rapid  ventricular response.      total cholesterol 198, triglycerides 300, LDL 104   PMH:   has a past medical history of Acute myocardial infarction of inferior wall ([place]); Chronic anticoagulation; Chronic atrial fibrillation ([place]); Chronic  combined systolic and diastolic CHF (congestive heart failure) ([place]); Essential hypertension; Hyperlipidemia, mixed; Mixed Ischemic/Nonischemic Cardiomyopathy; Obesity; Thyroid disease; Type II diabetes mellitus ([place]); and Vitamin D deficiency.  PSH:    Past Surgical History:  Procedure Laterality Date  . CARDIAC CATHETERIZATION  11/2012   [facility];EF 45-50%  . CHOLECYSTECTOMY  1999  . TUBAL LIGATION  1968  . VESICOVAGINAL FISTULA CLOSURE W/ TAH  1996    Current Outpatient Prescriptions  Medication Sig Dispense Refill  . ACCU-CHEK AVIVA PLUS test strip     . COMBIVENT RESPIMAT 20-100 MCG/ACT AERS respimat 1 puff every 6 (six) hours as needed.     . ezetimibe (ZETIA) 10 MG tablet Take 1 tablet (10 mg total) by mouth daily. 30 tablet 12  . fluticasone (FLONASE) 50 MCG/ACT nasal spray SPRAY TWO SPRAYS IN EACH NOSTRIL ONCE DAILY 16 g 12  . furosemide (LASIX) 20 MG tablet Take 1 tablet (20 mg total) by mouth 2 (two) times daily as needed. 60 tablet 11  . HYDROcodone-acetaminophen (NORCO) 10-325 MG tablet Take 1 tablet by mouth every 6 (six) hours as needed. 120 tablet 0  . insulin glargine (LANTUS) 100 UNIT/ML injection Inject 22 Units into the skin at bedtime.    . insulin lispro (HUMALOG) 100 UNIT/ML KiwkPen Inject up to 10 units three times daily before each meal as directed    . levothyroxine (SYNTHROID, LEVOTHROID) 125 MCG tablet Take 125 mcg by mouth daily before breakfast.    . lisinopril (PRINIVIL,ZESTRIL) 2.5 MG tablet Take 2.5 mg by mouth every evening.    Marland Kitchen LORazepam (ATIVAN) 1 MG tablet TAKE 1 TABLET BY MOUTH TWICE DAILY AS NEEDED FOR ANXIETY 60 tablet 4  . Magnesium 500 MG CAPS Take 500 mg by mouth daily.    . metoprolol succinate (TOPROL-XL) 25 MG 24 hr tablet Take 12.5 mg by mouth 2 (two) times daily.    . nitrofurantoin, macrocrystal-monohydrate, (MACROBID) 100 MG capsule Take 1 capsule (100 mg total) by mouth daily. 30 capsule 12  . nitroGLYCERIN (NITROSTAT) 0.4 MG SL tablet PACE  1 TABLET UNDER TONGUE EVERY 5 MINUTES AS NEEDED MAY REPEAT FOR UP TO 3 DOSES 25 tablet 0  . phenazopyridine (PYRIDIUM) 100 MG tablet Take 100 mg by mouth as needed. Reported on 01/21/2015    . rOPINIRole (REQUIP) 1 MG tablet Take 0.25 mg by mouth at bedtime.     . vitamin B-12 (CYANOCOBALAMIN) 1000 MCG tablet Take 1,000 mcg by mouth daily.    . Vitamin D, Ergocalciferol, (DRISDOL) 50000 units CAPS capsule Take 50,000 Units by mouth.     . warfarin (COUMADIN) 2 MG tablet Take 1 tablet (2 mg total) by mouth daily. 30 tablet 3  . warfarin (COUMADIN) 3 MG tablet TAKE 1 TABLET (3 MG TOTAL) BY MOUTH DAILY. 30 tablet 5  . warfarin (COUMADIN) 4 MG tablet Take 1 tablet (4 mg total) by mouth daily. 30 tablet 12   No current facility-administered medications for this visit.      Allergies:   Other; Sulfa antibiotics; Clarithromycin; Diphenhydramine; Estrogens conjugated; Estrogens, conjugated; Sulfonamide derivatives; and Nitrofurantoin   Social History:  The patient  reports that she has never smoked. She has never used smokeless tobacco. She reports that she does not drink alcohol or use  drugs.   Family History:   family history includes Heart disease in her mother; Thyroid disease in her father.    Review of Systems: Review of Systems  Constitutional: Negative.   Respiratory: Positive for shortness of breath.   Cardiovascular: Negative.   Gastrointestinal: Negative.   Musculoskeletal: Negative.   Neurological: Negative.   Psychiatric/Behavioral: Negative.   All other systems reviewed and are negative.    PHYSICAL EXAM: VS:  BP 118/72 (BP Location: Left Arm, Patient Position: Sitting, Cuff Size: Normal)   Pulse 77   Ht 5' 5.5" (1.664 m)   Wt 233 lb 8 oz (105.9 kg)   BMI 38.27 kg/m  , BMI Body mass index is 38.27 kg/m.  GEN: Well nourished, well developed, in no acute distress, obese  HEENT: normal  Neck: no JVD, carotid bruits, or masses Cardiac: Irregular rate and rhythm, no  murmurs, rubs, or gallops, trace lower extremity edema, pitting bilaterally Respiratory:  clear to auscultation bilaterally, normal work of breathing GI: soft, nontender, nondistended, + BS MS: no deformity or atrophy  Skin: warm and dry, no rash Neuro:  Strength and sensation are intact Psych: euthymic mood, full affect    Recent Labs: 05/21/2015: Hemoglobin 13.2 04/04/2016: ALT 16; BUN 17; Creatinine, Ser 0.84; Platelets 261; Potassium 5.4; Sodium 141    Lipid Panel Lab Results  Component Value Date   CHOL 162 04/04/2016   HDL 40 04/04/2016   LDLCALC 85 04/04/2016   TRIG 186 (H) 04/04/2016      Wt Readings from Last 3 Encounters:  05/11/16 233 lb 8 oz (105.9 kg)  03/28/16 234 lb (106.1 kg)  01/28/16 242 lb 8 oz (110 kg)       ASSESSMENT AND PLAN:   Mixed hyperlipidemia Unable to tolerate statins, tolerating zetia  Total chol 160  Essential hypertension - Plan: EKG 12-Lead Blood pressure is well controlled on today's visit. No changes made to the medications.  Atherosclerosis of native coronary artery of native heart without angina pectoris Currently with no symptoms of angina. No further workup at this time. Continue current medication regimen.  Mixed Ischemic/Nonischemic Cardiomyopathy - Plan: EKG 12-Lead Recommended she take 1-2 Lasix per day for leg swelling, shortness of breath Weight is up 8 pounds from her prior clinic visit  Chronic atrial fibrillation ([place]) - Plan: EKG 12-Lead Tolerating warfarin, rate well controlled  Chronic combined systolic and diastolic CHF (congestive heart failure) ([place]) Discussed her  leg swelling, Abdominal fullness hypoxia shortness of breath symptoms Recommended two Lasix Daily For several days Then down to 1 a day  moderate her fluid intake Increased back to Lasix to a day for shortness of breath abdominal swelling for severe episodes may need to in the morning and 2 after lunch All of this was discussed with  her  Shortness of breath - Plan: EKG 12-Lead As above, will need to increase her Lasix, decrease her salt and fluid intake Secondary to diastolic CHF and sleep apnea, atrial fibrillation  Obstructive sleep apnea  Type 2 diabetes mellitus without complication, with long-term current use of insulin ([place]) We have encouraged continued exercise, careful diet management in an effort to lose weight.   Total encounter time more than 25 minutes  Greater than 50% was spent in counseling and coordination of care with the patient   Disposition:   F/U  6 months   Orders Placed This Encounter  Procedures  . EKG 12-Lead     Signed, Esmond Plants, M.D., Ph.D. 05/11/2016  [facility]  [place], [place]

## 2016-05-10 NOTE — Telephone Encounter (Signed)
Called patient, she is coming in 05/11/16 at 8am to see Dr Rockey Situ

## 2016-05-11 ENCOUNTER — Ambulatory Visit (INDEPENDENT_AMBULATORY_CARE_PROVIDER_SITE_OTHER): Payer: Medicare Other | Admitting: Cardiovascular Disease

## 2016-05-11 ENCOUNTER — Encounter: Payer: Self-pay | Admitting: Cardiovascular Disease

## 2016-05-11 VITALS — BP 118/72 | HR 77 | Ht 65.5 in | Wt 233.5 lb

## 2016-05-11 DIAGNOSIS — I2510 Atherosclerotic heart disease of native coronary artery without angina pectoris: Secondary | ICD-10-CM | POA: Diagnosis not present

## 2016-05-11 DIAGNOSIS — E784 Other hyperlipidemia: Secondary | ICD-10-CM

## 2016-05-11 DIAGNOSIS — I482 Chronic atrial fibrillation, unspecified: Secondary | ICD-10-CM

## 2016-05-11 DIAGNOSIS — R531 Weakness: Secondary | ICD-10-CM

## 2016-05-11 DIAGNOSIS — E7849 Other hyperlipidemia: Secondary | ICD-10-CM

## 2016-05-11 DIAGNOSIS — R0602 Shortness of breath: Secondary | ICD-10-CM | POA: Diagnosis not present

## 2016-05-11 DIAGNOSIS — E1159 Type 2 diabetes mellitus with other circulatory complications: Secondary | ICD-10-CM

## 2016-05-11 DIAGNOSIS — I5042 Chronic combined systolic (congestive) and diastolic (congestive) heart failure: Secondary | ICD-10-CM | POA: Diagnosis not present

## 2016-05-11 DIAGNOSIS — I428 Other cardiomyopathies: Secondary | ICD-10-CM | POA: Diagnosis not present

## 2016-05-11 DIAGNOSIS — Z794 Long term (current) use of insulin: Secondary | ICD-10-CM

## 2016-05-11 MED ORDER — FUROSEMIDE 20 MG PO TABS: 20.0000 mg | ORAL_TABLET | Freq: Two times a day (BID) | ORAL | 11 refills | Status: DC | PRN

## 2016-05-11 NOTE — Patient Instructions (Addendum)
Medication Instructions:   Please take lasix 2 a day for a few days then take lasix daily  For the shortness of breath  Anytime you have shortness of breath, Take 2 lasix  Watch the fluids and salt  Labwork:  No new labs needed  Testing/Procedures:  No further testing at this time   I recommend watching educational videos on topics of interest to you at:       www.goemmi.com  Enter code: HEARTCARE    Follow-Up: It was a pleasure seeing you in the office today. Please call us if you have new issues that need to be addressed before your next appt.  [phone]  Your physician wants you to follow-up in: 6 months.  You will receive a reminder letter in the mail two months in advance. If you don't receive a letter, please call our office to schedule the follow-up appointment.  If you need a refill on your cardiac medications before your next appointment, please call your pharmacy.

## 2016-05-12 ENCOUNTER — Ambulatory Visit (INDEPENDENT_AMBULATORY_CARE_PROVIDER_SITE_OTHER): Payer: Medicare Other | Admitting: Emergency Medicine

## 2016-05-12 DIAGNOSIS — I482 Chronic atrial fibrillation, unspecified: Secondary | ICD-10-CM

## 2016-05-12 LAB — POCT INR
INR: 4.4
PT: 52.9

## 2016-05-12 NOTE — Patient Instructions (Signed)
Anticoagulation Dose Instructions as of 05/12/2016      Tracy Mann Tue Wed Thu Fri Sat   New Dose 3 mg 4 mg 3 mg 4 mg 3 mg Hold Hold    Description   Hold for 2 days then alternate taking 3 mg and 4 mg daily. Follow up in 2 weeks

## 2016-05-15 ENCOUNTER — Telehealth: Payer: Self-pay | Admitting: Emergency Medicine

## 2016-05-15 NOTE — Telephone Encounter (Signed)
No--different nasal sprays feel different but I think flonase is only generic available.

## 2016-05-15 NOTE — Telephone Encounter (Signed)
Pt reports that many years ago you gave her a nasal spray that was more of a dry mist than Flonase. She said it worked better for her than the [facility]. I looked back in her chart as to what this might be. I could not find anything other than Flonase. She said she was not given as a sample. Any idea to what this might be? Please advise.

## 2016-05-15 NOTE — Telephone Encounter (Signed)
Advised  ED 

## 2016-05-23 ENCOUNTER — Other Ambulatory Visit: Payer: Self-pay | Admitting: Family Medicine

## 2016-05-26 ENCOUNTER — Ambulatory Visit (INDEPENDENT_AMBULATORY_CARE_PROVIDER_SITE_OTHER): Payer: Medicare Other

## 2016-05-26 DIAGNOSIS — I482 Chronic atrial fibrillation, unspecified: Secondary | ICD-10-CM

## 2016-05-26 LAB — POCT INR
INR: 3.1
INR: 3.1
PT: 36.9
PT: 36.9

## 2016-05-26 NOTE — Patient Instructions (Signed)
Anticoagulation Dose Instructions as of 05/26/2016      Dorene Grebe Tue Wed Thu Fri Sat   New Dose 3 mg 4 mg 3 mg 4 mg 3 mg 4 mg 3 mg    Description   3 mg daily except 4 mg M, W, F

## 2016-05-31 ENCOUNTER — Other Ambulatory Visit: Payer: Self-pay | Admitting: Family Medicine

## 2016-05-31 MED ORDER — EZETIMIBE 10 MG PO TABS: 10.0000 mg | ORAL_TABLET | Freq: Every day | ORAL | 3 refills | Status: DC

## 2016-05-31 NOTE — Telephone Encounter (Signed)
Done-aa 

## 2016-05-31 NOTE — Telephone Encounter (Signed)
[facility] pharmacy faxed a request on the following medication. Thanks CC  ezetimibe (ZETIA) 10 MG tablet  Take 1 tablet ( 10 MG total ) by mouth daily.

## 2016-06-09 ENCOUNTER — Ambulatory Visit (INDEPENDENT_AMBULATORY_CARE_PROVIDER_SITE_OTHER): Payer: Medicare Other | Admitting: Emergency Medicine

## 2016-06-09 DIAGNOSIS — I482 Chronic atrial fibrillation, unspecified: Secondary | ICD-10-CM

## 2016-06-09 LAB — POCT INR
INR: 2.2
PT: 25.9

## 2016-06-14 ENCOUNTER — Ambulatory Visit: Payer: Medicare Other

## 2016-06-17 ENCOUNTER — Other Ambulatory Visit: Payer: Self-pay | Admitting: Family Medicine

## 2016-06-19 ENCOUNTER — Ambulatory Visit (INDEPENDENT_AMBULATORY_CARE_PROVIDER_SITE_OTHER): Payer: Medicare Other | Admitting: Family Medicine

## 2016-06-19 VITALS — BP 110/58 | HR 68 | Temp 98.1°F | Resp 16 | Wt 237.0 lb

## 2016-06-19 DIAGNOSIS — E119 Type 2 diabetes mellitus without complications: Secondary | ICD-10-CM

## 2016-06-19 DIAGNOSIS — I482 Chronic atrial fibrillation, unspecified: Secondary | ICD-10-CM

## 2016-06-19 DIAGNOSIS — M150 Primary generalized (osteo)arthritis: Secondary | ICD-10-CM

## 2016-06-19 DIAGNOSIS — Z794 Long term (current) use of insulin: Secondary | ICD-10-CM | POA: Diagnosis not present

## 2016-06-19 DIAGNOSIS — M159 Polyosteoarthritis, unspecified: Secondary | ICD-10-CM

## 2016-06-19 LAB — POCT INR
INR: 2.6
PT: 31

## 2016-06-19 MED ORDER — HYDROCODONE-ACETAMINOPHEN 10-325 MG PO TABS: 1.0000 | ORAL_TABLET | Freq: Four times a day (QID) | ORAL | 0 refills | Status: DC | PRN

## 2016-06-19 MED ORDER — HYDROCODONE-ACETAMINOPHEN 10-325 MG PO TABS
1.0000 | ORAL_TABLET | Freq: Four times a day (QID) | ORAL | 0 refills | Status: DC | PRN
Start: 2016-06-19 — End: 2016-06-19

## 2016-06-19 NOTE — Progress Notes (Signed)
Tracy Mann  MRN: [id number] DOB: 1934/01/13  Subjective:  HPI  Patient is here for 3 months follow up. Last office visit was in 03/2016. Patient sees endocrinologist for TSH and A1C follow ups. Chronic pain: patient needs refill on Norco. Patient is going out of town and that is why she came in a little bit early, next RX refill is due on 06/25/16. Atrial fib: patient had INR checked on 06/09/16 and INR was 2.2-patient was advised to take 3 mg M/W/F and 4 mg other days. Wt Readings from Last 3 Encounters:  06/19/16 237 lb (107.5 kg)  05/11/16 233 lb 8 oz (105.9 kg)  03/28/16 234 lb (106.1 kg)   BP Readings from Last 3 Encounters:  06/19/16 (!) 110/58  05/11/16 118/72  03/28/16 128/62   Patient Active Problem List   Diagnosis Date Noted  . Angina pectoris ([place]) 06/07/2015  . Angina at rest [facility]) 05/20/2015  . Weakness 03/03/2015  . Mixed Ischemic/Nonischemic Cardiomyopathy   . Type II diabetes mellitus ([place])   . Chronic atrial fibrillation ([place])   . Chronic combined systolic and diastolic CHF (congestive heart failure) ([place])   . Chronic anticoagulation   . Absolute anemia 07/08/2014  . Airway hyperreactivity 07/08/2014  . Cervical muscle strain 07/08/2014  . PNA (pneumonia) 07/08/2014  . Chest pressure 07/08/2014  . Clinical depression 07/08/2014  . Degeneration of lumbar or lumbosacral intervertebral disc 07/08/2014  . Accumulation of fluid in tissues 07/08/2014  . Anxiety, generalized 07/08/2014  . Acid reflux 07/08/2014  . Folliculitis 63/78/5885  . Adult hypothyroidism 07/08/2014  . Broken leg 07/08/2014  . Cannot sleep 07/08/2014  . Obstructive sleep apnea 07/08/2014  . Arthritis, degenerative 07/08/2014  . Onychia of finger 07/08/2014  . Psoriasis 07/08/2014  . Restless legs syndrome 07/08/2014  . Adult BMI 30+ 07/08/2014  . Calculus of kidney 10/31/2011  . Bladder infection, chronic 10/31/2011  . Incomplete bladder emptying 10/31/2011  . Obesity  11/02/2010  . SHORTNESS OF BREATH 02/01/2010  . Hyperlipidemia 09/16/2008  . CAD, NATIVE VESSEL 09/16/2008  . Atrial fibrillation ([place]) 09/16/2008    Past Medical History:  Diagnosis Date  . Acute myocardial infarction of inferior wall ([place])    a. 10/2005 - Presumed to be 2/2 to a coronary embolus in setting of persistent Afib-->Cath relatively nl cors, EF 45-50% w/ severe distal inferior/apical inferior HK w/ aneursymal appearance.  . Chronic anticoagulation    a. warfarin.  . Chronic atrial fibrillation (HCC)    a. CHA2DS2VASc = 6--> warfarin.  . Chronic combined systolic and diastolic CHF (congestive heart failure) ([place])    a. 10/2012 Echo: EF 35-40%, diff HK, mild MR, mild biatrial enlargement;  b. 11/2012 EF 45-50% by LV gram; c. echo 07/2014: EF 40-45%, mod conc LVH, mod MR, severe biatrial enlargement, PASP 45 mm Hg  . Essential hypertension   . Hyperlipidemia, mixed   . Mixed Ischemic/Nonischemic Cardiomyopathy    a. 10/2012 Echo: EF 35-40%;  b. 11/2012 EF 45-50% by LV gram.  . Obesity   . Thyroid disease    hypothyroidism  . Type II diabetes mellitus ([place])   . Vitamin D deficiency     Social History   Social History  . Marital status: Widowed    Spouse name: widowed  . Number of children: 4  . Years of education: 14   Occupational History  . retired    Social History Main Topics  . Smoking status: Never Smoker  . Smokeless tobacco: Never Used  .  Alcohol use No  . Drug use: No  . Sexual activity: No   Other Topics Concern  . Not on file   Social History Narrative   Widowed   Has children and grandchildren   Lives in [place]    Outpatient Encounter Prescriptions as of 06/19/2016  Medication Sig  . ACCU-CHEK AVIVA PLUS test strip   . COMBIVENT RESPIMAT 20-100 MCG/ACT AERS respimat 1 puff every 6 (six) hours as needed.   . ezetimibe (ZETIA) 10 MG tablet Take 1 tablet (10 mg total) by mouth daily.  . fluticasone (FLONASE) 50 MCG/ACT nasal spray SPRAY  TWO SPRAYS IN EACH NOSTRIL ONCE DAILY  . furosemide (LASIX) 20 MG tablet Take 1 tablet (20 mg total) by mouth 2 (two) times daily as needed.  Marland Kitchen HYDROcodone-acetaminophen (NORCO) 10-325 MG tablet Take 1 tablet by mouth every 6 (six) hours as needed.  . insulin glargine (LANTUS) 100 UNIT/ML injection Inject 22 Units into the skin at bedtime.  . insulin lispro (HUMALOG) 100 UNIT/ML KiwkPen Inject up to 10 units three times daily before each meal as directed  . levothyroxine (SYNTHROID, LEVOTHROID) 125 MCG tablet Take 125 mcg by mouth daily before breakfast.  . lisinopril (PRINIVIL,ZESTRIL) 2.5 MG tablet Take 2.5 mg by mouth every evening.  Marland Kitchen LORazepam (ATIVAN) 1 MG tablet TAKE 1 TABLET BY MOUTH TWICE DAILY AS NEEDED FOR ANXIETY  . Magnesium 500 MG CAPS Take 500 mg by mouth daily.  . metoprolol succinate (TOPROL-XL) 25 MG 24 hr tablet Take 12.5 mg by mouth 2 (two) times daily.  . nitrofurantoin, macrocrystal-monohydrate, (MACROBID) 100 MG capsule Take 1 capsule (100 mg total) by mouth daily.  . nitroGLYCERIN (NITROSTAT) 0.4 MG SL tablet PACE 1 TABLET UNDER TONGUE EVERY 5 MINUTES AS NEEDED MAY REPEAT FOR UP TO 3 DOSES  . nitroGLYCERIN (NITROSTAT) 0.4 MG SL tablet PACE 1 TABLET UNDER TONGUE EVERY 5 MINUTES AS NEEDED MAY REPEAT FOR UP TO 3 DOSES  . phenazopyridine (PYRIDIUM) 100 MG tablet Take 100 mg by mouth as needed. Reported on 01/21/2015  . rOPINIRole (REQUIP) 1 MG tablet Take 0.25 mg by mouth at bedtime.   . vitamin B-12 (CYANOCOBALAMIN) 1000 MCG tablet Take 1,000 mcg by mouth daily.  . Vitamin D, Ergocalciferol, (DRISDOL) 50000 units CAPS capsule Take 50,000 Units by mouth.   . warfarin (COUMADIN) 2 MG tablet Take 1 tablet (2 mg total) by mouth daily.  Marland Kitchen warfarin (COUMADIN) 3 MG tablet TAKE 1 TABLET (3 MG TOTAL) BY MOUTH DAILY.  Marland Kitchen warfarin (COUMADIN) 4 MG tablet Take 1 tablet (4 mg total) by mouth daily.   No facility-administered encounter medications on file as of 06/19/2016.     Allergies    Allergen Reactions  . Other Anaphylaxis and Swelling    'tongue swelling'  . Sulfa Antibiotics Anaphylaxis  . Clarithromycin Other (See Comments)    Hives, headaches, hard time swelling, felt like throat was closing up Hives, headaches, hard time swelling, felt like throat was closing up  . Diphenhydramine Other (See Comments)  . Estrogens Conjugated   . Estrogens, Conjugated Other (See Comments)  . Sulfonamide Derivatives Swelling    'tongue swelling'  . Nitrofurantoin Nausea Only    Review of Systems  Constitutional: Negative.   Respiratory: Negative.   Cardiovascular: Negative.   Gastrointestinal: Negative.   Musculoskeletal: Positive for back pain and joint pain.    Objective:  BP (!) 110/58   Pulse 68   Temp 98.1 F (36.7 C)   Resp 16  Wt 237 lb (107.5 kg)   BMI 38.84 kg/m   Physical Exam  Constitutional: She is oriented to person, place, and time and well-developed, well-nourished, and in no distress.  HENT:  Head: Normocephalic and atraumatic.  Eyes: Conjunctivae are normal. Pupils are equal, round, and reactive to light.  Neck: Normal range of motion. Neck supple.  Cardiovascular: Normal rate, regular rhythm, normal heart sounds and intact distal pulses.  Exam reveals no gallop.   No murmur heard. Pulmonary/Chest: Effort normal and breath sounds normal. No respiratory distress. She has no wheezes.  Neurological: She is alert and oriented to person, place, and time.    Assessment and Plan :  1. Primary osteoarthritis involving multiple joints Refill given. Patient was advised with new regulations this medication may need PA from insurance. - HYDROcodone-acetaminophen (NORCO) 10-325 MG tablet; Take 1 tablet by mouth every 6 (six) hours as needed.  Dispense: 120 tablet; Refill: 0 - HYDROcodone-acetaminophen (NORCO) 10-325 MG tablet; Take 1 tablet by mouth every 6 (six) hours as needed.  Dispense: 120 tablet; Refill: 0 - HYDROcodone-acetaminophen (NORCO) 10-325  MG tablet; Take 1 tablet by mouth every 6 (six) hours as needed.  Dispense: 120 tablet; Refill: 0 Pt claims she "cannot live " without narcotic. 2. Chronic atrial fibrillation (HCC) INR 2.6. Same dose. Re check in 1 month - POCT INR  3. Type 2 diabetes mellitus without complication, with long-term current use of insulin [facility]) Sees endocrinologist.  HPI, Exam and A&P transcribed by Theressa Millard, RMA under direction and in the presence of Miguel Aschoff, MD. I have done the exam and reviewed the chart and it is accurate to the best of my knowledge. Development worker, community has been used and  any errors in dictation or transcription are unintentional. Miguel Aschoff M.D. [facility]

## 2016-06-22 ENCOUNTER — Ambulatory Visit: Payer: Medicare Other | Admitting: Family Medicine

## 2016-06-23 ENCOUNTER — Ambulatory Visit: Payer: Self-pay

## 2016-06-27 ENCOUNTER — Ambulatory Visit: Payer: Medicare Other | Admitting: Family Medicine

## 2016-07-03 ENCOUNTER — Ambulatory Visit: Payer: Medicare Other | Admitting: Cardiovascular Disease

## 2016-07-03 DIAGNOSIS — E119 Type 2 diabetes mellitus without complications: Secondary | ICD-10-CM | POA: Diagnosis not present

## 2016-07-03 DIAGNOSIS — Z794 Long term (current) use of insulin: Secondary | ICD-10-CM | POA: Diagnosis not present

## 2016-07-03 DIAGNOSIS — E559 Vitamin D deficiency, unspecified: Secondary | ICD-10-CM | POA: Diagnosis not present

## 2016-07-03 DIAGNOSIS — E1165 Type 2 diabetes mellitus with hyperglycemia: Secondary | ICD-10-CM | POA: Diagnosis not present

## 2016-07-03 DIAGNOSIS — E039 Hypothyroidism, unspecified: Secondary | ICD-10-CM | POA: Diagnosis not present

## 2016-07-06 ENCOUNTER — Other Ambulatory Visit: Payer: Self-pay | Admitting: Family Medicine

## 2016-07-19 ENCOUNTER — Ambulatory Visit (INDEPENDENT_AMBULATORY_CARE_PROVIDER_SITE_OTHER): Payer: Medicare Other

## 2016-07-19 DIAGNOSIS — I482 Chronic atrial fibrillation, unspecified: Secondary | ICD-10-CM

## 2016-07-19 LAB — POCT INR
INR: 2.3
PT: 27.6

## 2016-07-19 NOTE — Progress Notes (Signed)
Anticoagulation Warfarin Dose Instructions as of 07/19/2016      Tracy Mann Tue Wed Thu Fri Sat   New Dose 4 mg 3 mg 4 mg 3 mg 4 mg 3 mg 4 mg    Description   Continue 3 mg on M,W,F and 4 mg the rest of the days. Follow up in 4 weeks

## 2016-07-27 ENCOUNTER — Other Ambulatory Visit: Payer: Self-pay | Admitting: Family Medicine

## 2016-07-28 NOTE — Telephone Encounter (Signed)
Rx was approved on previous request. Rx for Lorazepam (Ativan) 1 mg has been faxed to [place] FAX# [phone]. Thanks TNP

## 2016-08-16 ENCOUNTER — Ambulatory Visit (INDEPENDENT_AMBULATORY_CARE_PROVIDER_SITE_OTHER): Payer: Medicare Other

## 2016-08-16 DIAGNOSIS — I482 Chronic atrial fibrillation, unspecified: Secondary | ICD-10-CM

## 2016-08-16 LAB — POCT INR
INR: 2.5
PT: 30.5

## 2016-08-16 NOTE — Progress Notes (Signed)
Anticoagulation Warfarin Dose Instructions as of 08/16/2016      Tracy Mann Tue Wed Thu Fri Sat   New Dose 4 mg 3 mg 4 mg 3 mg 4 mg 3 mg 4 mg    Description   Continue 3 mg on M,W,F and 4 mg the rest of the days. Follow up in 4 weeks

## 2016-09-13 ENCOUNTER — Ambulatory Visit (INDEPENDENT_AMBULATORY_CARE_PROVIDER_SITE_OTHER): Payer: Medicare Other

## 2016-09-13 DIAGNOSIS — I482 Chronic atrial fibrillation, unspecified: Secondary | ICD-10-CM

## 2016-09-13 LAB — POCT INR
INR: 3.4
PT: 41.3

## 2016-09-13 NOTE — Patient Instructions (Signed)
Anticoagulation Warfarin Dose Instructions as of 09/13/2016      Dorene Grebe Tue Wed Thu Fri Sat   New Dose 3 mg 3 mg 3 mg 3 mg 3 mg 3 mg 5 mg    Description   Dx: Atrial Fibrillation (ICD 10  I48.2) Current Coumadin Dose: 4mg  two days a week, then 3mg  all other days PT:41.3 INR:3.4 Changes: Hold for 1 day, then take 3mg  every day Recheck:1 week

## 2016-09-19 ENCOUNTER — Ambulatory Visit (INDEPENDENT_AMBULATORY_CARE_PROVIDER_SITE_OTHER): Payer: Medicare Other | Admitting: Family Medicine

## 2016-09-19 VITALS — BP 140/80 | HR 88 | Temp 97.9°F | Resp 16 | Wt 239.0 lb

## 2016-09-19 DIAGNOSIS — M150 Primary generalized (osteo)arthritis: Secondary | ICD-10-CM | POA: Diagnosis not present

## 2016-09-19 DIAGNOSIS — G894 Chronic pain syndrome: Secondary | ICD-10-CM | POA: Diagnosis not present

## 2016-09-19 DIAGNOSIS — I482 Chronic atrial fibrillation, unspecified: Secondary | ICD-10-CM

## 2016-09-19 DIAGNOSIS — M159 Polyosteoarthritis, unspecified: Secondary | ICD-10-CM

## 2016-09-19 LAB — POCT INR
INR: 2.5
PT: 29.9

## 2016-09-19 MED ORDER — HYDROCODONE-ACETAMINOPHEN 10-325 MG PO TABS: 1.0000 | ORAL_TABLET | Freq: Four times a day (QID) | ORAL | 0 refills | Status: DC | PRN

## 2016-09-19 NOTE — Patient Instructions (Signed)
Anticoagulation Warfarin Dose Instructions as of 09/19/2016      Tracy Mann Tue Wed Thu Fri Sat   New Dose 3 mg 3 mg 3 mg 3 mg 3 mg 3 mg 5 mg    Description   3 MG daily

## 2016-09-19 NOTE — Progress Notes (Signed)
Tracy Mann  MRN: [id number] DOB: Oct 10, 1933  Subjective:  HPI   Patient is an 83 year old female who presents for follow up of chronic pain and to get refill on her pain medication.  Patient reports having back pain from time to time but also leg, hip and foot pain. The patient states she takes the pan medicine every 6 hours.  Patient Active Problem List   Diagnosis Date Noted  . Angina pectoris ([place]) 06/07/2015  . Angina at rest [facility]) 05/20/2015  . Weakness 03/03/2015  . Mixed Ischemic/Nonischemic Cardiomyopathy   . Type II diabetes mellitus ([place])   . Chronic atrial fibrillation ([place])   . Chronic combined systolic and diastolic CHF (congestive heart failure) ([place])   . Chronic anticoagulation   . Absolute anemia 07/08/2014  . Airway hyperreactivity 07/08/2014  . Cervical muscle strain 07/08/2014  . PNA (pneumonia) 07/08/2014  . Chest pressure 07/08/2014  . Clinical depression 07/08/2014  . Degeneration of lumbar or lumbosacral intervertebral disc 07/08/2014  . Accumulation of fluid in tissues 07/08/2014  . Anxiety, generalized 07/08/2014  . Acid reflux 07/08/2014  . Folliculitis 82/99/3716  . Adult hypothyroidism 07/08/2014  . Broken leg 07/08/2014  . Cannot sleep 07/08/2014  . Obstructive sleep apnea 07/08/2014  . Arthritis, degenerative 07/08/2014  . Onychia of finger 07/08/2014  . Psoriasis 07/08/2014  . Restless legs syndrome 07/08/2014  . Adult BMI 30+ 07/08/2014  . Calculus of kidney 10/31/2011  . Bladder infection, chronic 10/31/2011  . Incomplete bladder emptying 10/31/2011  . Obesity 11/02/2010  . SHORTNESS OF BREATH 02/01/2010  . Hyperlipidemia 09/16/2008  . CAD, NATIVE VESSEL 09/16/2008  . Atrial fibrillation ([place]) 09/16/2008    Past Medical History:  Diagnosis Date  . Acute myocardial infarction of inferior wall ([place])    a. 10/2005 - Presumed to be 2/2 to a coronary embolus in setting of persistent Afib-->Cath relatively nl cors, EF 45-50% w/  severe distal inferior/apical inferior HK w/ aneursymal appearance.  . Chronic anticoagulation    a. warfarin.  . Chronic atrial fibrillation (HCC)    a. CHA2DS2VASc = 6--> warfarin.  . Chronic combined systolic and diastolic CHF (congestive heart failure) ([place])    a. 10/2012 Echo: EF 35-40%, diff HK, mild MR, mild biatrial enlargement;  b. 11/2012 EF 45-50% by LV gram; c. echo 07/2014: EF 40-45%, mod conc LVH, mod MR, severe biatrial enlargement, PASP 45 mm Hg  . Essential hypertension   . Hyperlipidemia, mixed   . Mixed Ischemic/Nonischemic Cardiomyopathy    a. 10/2012 Echo: EF 35-40%;  b. 11/2012 EF 45-50% by LV gram.  . Obesity   . Thyroid disease    hypothyroidism  . Type II diabetes mellitus ([place])   . Vitamin D deficiency     Social History   Social History  . Marital status: Widowed    Spouse name: widowed  . Number of children: 4  . Years of education: 14   Occupational History  . retired    Social History Main Topics  . Smoking status: Never Smoker  . Smokeless tobacco: Never Used  . Alcohol use No  . Drug use: No  . Sexual activity: No   Other Topics Concern  . Not on file   Social History Narrative   Widowed   Has children and grandchildren   Lives in [place]    Outpatient Encounter Prescriptions as of 09/19/2016  Medication Sig  . ACCU-CHEK AVIVA PLUS test strip   . COMBIVENT RESPIMAT 20-100 MCG/ACT AERS  respimat 1 puff every 6 (six) hours as needed.   . ezetimibe (ZETIA) 10 MG tablet Take 1 tablet (10 mg total) by mouth daily.  . fluticasone (FLONASE) 50 MCG/ACT nasal spray SPRAY TWO SPRAYS IN EACH NOSTRIL ONCE DAILY  . furosemide (LASIX) 20 MG tablet Take 1 tablet (20 mg total) by mouth 2 (two) times daily as needed.  Marland Kitchen HYDROcodone-acetaminophen (NORCO) 10-325 MG tablet Take 1 tablet by mouth every 6 (six) hours as needed.  Marland Kitchen HYDROcodone-acetaminophen (NORCO) 10-325 MG tablet Take 1 tablet by mouth every 6 (six) hours as needed.  Marland Kitchen  HYDROcodone-acetaminophen (NORCO) 10-325 MG tablet Take 1 tablet by mouth every 6 (six) hours as needed.  . insulin glargine (LANTUS) 100 UNIT/ML injection Inject 22 Units into the skin at bedtime.  . insulin lispro (HUMALOG) 100 UNIT/ML KiwkPen Inject up to 10 units three times daily before each meal as directed  . levothyroxine (SYNTHROID, LEVOTHROID) 125 MCG tablet Take 125 mcg by mouth daily before breakfast.  . lisinopril (PRINIVIL,ZESTRIL) 2.5 MG tablet Take 2.5 mg by mouth every evening.  Marland Kitchen LORazepam (ATIVAN) 1 MG tablet TAKE 1 TABLET BY MOUTH TWICE A DAY AS NEEDED FOR ANXIETY  . Magnesium 500 MG CAPS Take 500 mg by mouth daily.  . metoprolol succinate (TOPROL-XL) 25 MG 24 hr tablet Take 12.5 mg by mouth 2 (two) times daily.  . nitrofurantoin (MACRODANTIN) 50 MG capsule Take by mouth.  . nitrofurantoin, macrocrystal-monohydrate, (MACROBID) 100 MG capsule TAKE 1 CAPSULE (100 MG TOTAL) BY MOUTH DAILY.  . nitroGLYCERIN (NITROSTAT) 0.4 MG SL tablet PACE 1 TABLET UNDER TONGUE EVERY 5 MINUTES AS NEEDED MAY REPEAT FOR UP TO 3 DOSES  . nitroGLYCERIN (NITROSTAT) 0.4 MG SL tablet PACE 1 TABLET UNDER TONGUE EVERY 5 MINUTES AS NEEDED MAY REPEAT FOR UP TO 3 DOSES  . rOPINIRole (REQUIP) 0.25 MG tablet TAKE 1 TABLET BY MOUTH AT BEDTIME  . vitamin B-12 (CYANOCOBALAMIN) 1000 MCG tablet Take 1,000 mcg by mouth daily.  . Vitamin D, Ergocalciferol, (DRISDOL) 50000 units CAPS capsule Take 50,000 Units by mouth.   . warfarin (COUMADIN) 2 MG tablet Take 1 tablet (2 mg total) by mouth daily.  Marland Kitchen warfarin (COUMADIN) 3 MG tablet TAKE 1 TABLET (3 MG TOTAL) BY MOUTH DAILY.  Marland Kitchen warfarin (COUMADIN) 4 MG tablet Take 1 tablet (4 mg total) by mouth daily.  . [DISCONTINUED] phenazopyridine (PYRIDIUM) 100 MG tablet Take 100 mg by mouth as needed. Reported on 01/21/2015  . [DISCONTINUED] rOPINIRole (REQUIP) 1 MG tablet Take 0.25 mg by mouth at bedtime.    No facility-administered encounter medications on file as of 09/19/2016.      Allergies  Allergen Reactions  . Other Anaphylaxis and Swelling    'tongue swelling'  . Sulfa Antibiotics Anaphylaxis  . Clarithromycin Other (See Comments)    Hives, headaches, hard time swelling, felt like throat was closing up Hives, headaches, hard time swelling, felt like throat was closing up  . Diphenhydramine Other (See Comments)  . Estrogens Conjugated   . Estrogens, Conjugated Other (See Comments)  . Sulfonamide Derivatives Swelling    'tongue swelling'  . Nitrofurantoin Nausea Only    Review of Systems  Eyes: Negative.   Respiratory: Negative for cough, shortness of breath and wheezing.   Cardiovascular: Negative for chest pain, palpitations and orthopnea.  Musculoskeletal: Positive for back pain, joint pain and myalgias.  Neurological: Negative.   Endo/Heme/Allergies: Negative.   Psychiatric/Behavioral: Negative.     Objective:  BP 140/80 (BP Location: Right  Arm, Patient Position: Sitting, Cuff Size: Normal)   Pulse 88   Temp 97.9 F (36.6 C) (Oral)   Resp 16   Wt 239 lb (108.4 kg)   BMI 39.17 kg/m   Physical Exam  Constitutional: She is oriented to person, place, and time and well-developed, well-nourished, and in no distress.  HENT:  Head: Normocephalic and atraumatic.  Eyes: Pupils are equal, round, and reactive to light. Conjunctivae are normal.  Cardiovascular: Normal rate, regular rhythm, normal heart sounds and intact distal pulses.   varicose veins of the lower legs and feet  Pulmonary/Chest: Effort normal and breath sounds normal.  Abdominal: Soft.  Neurological: She is alert and oriented to person, place, and time.  Skin: Skin is warm and dry.  Psychiatric: Mood, memory, affect and judgment normal.    Assessment and Plan :  .OA/Chronic Pain Refill norco --encouraged pt to cut dose in half AFib PT 3 weeks. CAD  I have done the exam and reviewed the chart and it is accurate to the best of my knowledge. Development worker, community has been used  and  any errors in dictation or transcription are unintentional. Miguel Aschoff M.D. [facility]

## 2016-09-26 DIAGNOSIS — Z794 Long term (current) use of insulin: Secondary | ICD-10-CM | POA: Diagnosis not present

## 2016-09-26 DIAGNOSIS — E039 Hypothyroidism, unspecified: Secondary | ICD-10-CM | POA: Diagnosis not present

## 2016-09-26 DIAGNOSIS — E119 Type 2 diabetes mellitus without complications: Secondary | ICD-10-CM | POA: Diagnosis not present

## 2016-10-01 ENCOUNTER — Other Ambulatory Visit: Payer: Self-pay | Admitting: Family Medicine

## 2016-10-03 DIAGNOSIS — E785 Hyperlipidemia, unspecified: Secondary | ICD-10-CM | POA: Diagnosis not present

## 2016-10-03 DIAGNOSIS — E039 Hypothyroidism, unspecified: Secondary | ICD-10-CM | POA: Diagnosis not present

## 2016-10-03 DIAGNOSIS — E119 Type 2 diabetes mellitus without complications: Secondary | ICD-10-CM | POA: Diagnosis not present

## 2016-10-03 DIAGNOSIS — E781 Pure hyperglyceridemia: Secondary | ICD-10-CM | POA: Diagnosis not present

## 2016-10-03 DIAGNOSIS — E559 Vitamin D deficiency, unspecified: Secondary | ICD-10-CM | POA: Diagnosis not present

## 2016-10-11 ENCOUNTER — Ambulatory Visit (INDEPENDENT_AMBULATORY_CARE_PROVIDER_SITE_OTHER): Payer: Medicare Other | Admitting: *Deleted

## 2016-10-11 DIAGNOSIS — I482 Chronic atrial fibrillation, unspecified: Secondary | ICD-10-CM

## 2016-10-11 LAB — POCT INR
INR: 2.2
PT: 25.8

## 2016-10-11 NOTE — Patient Instructions (Signed)
Continue 3 mg daily and 4 mg on Saturday

## 2016-10-16 ENCOUNTER — Other Ambulatory Visit: Payer: Self-pay | Admitting: Family Medicine

## 2016-10-23 DIAGNOSIS — E785 Hyperlipidemia, unspecified: Secondary | ICD-10-CM | POA: Diagnosis not present

## 2016-10-23 DIAGNOSIS — E119 Type 2 diabetes mellitus without complications: Secondary | ICD-10-CM | POA: Diagnosis not present

## 2016-10-23 DIAGNOSIS — E781 Pure hyperglyceridemia: Secondary | ICD-10-CM | POA: Diagnosis not present

## 2016-10-23 DIAGNOSIS — E039 Hypothyroidism, unspecified: Secondary | ICD-10-CM | POA: Diagnosis not present

## 2016-10-23 DIAGNOSIS — E559 Vitamin D deficiency, unspecified: Secondary | ICD-10-CM | POA: Diagnosis not present

## 2016-11-10 ENCOUNTER — Ambulatory Visit: Payer: Self-pay

## 2016-12-01 ENCOUNTER — Other Ambulatory Visit: Payer: Self-pay | Admitting: Emergency Medicine

## 2016-12-01 ENCOUNTER — Ambulatory Visit (INDEPENDENT_AMBULATORY_CARE_PROVIDER_SITE_OTHER): Payer: Medicare Other | Admitting: Emergency Medicine

## 2016-12-01 DIAGNOSIS — I4891 Unspecified atrial fibrillation: Secondary | ICD-10-CM | POA: Diagnosis not present

## 2016-12-01 DIAGNOSIS — I482 Chronic atrial fibrillation, unspecified: Secondary | ICD-10-CM

## 2016-12-01 LAB — POCT INR
INR: 4
PT: 48.6

## 2016-12-01 NOTE — Patient Instructions (Signed)
Anticoagulation Warfarin Dose Instructions as of 12/01/2016      Tracy Mann Tue Wed Thu Fri Sat   New Dose 3 mg 3 mg 3 mg 3 mg 3 mg 3 mg 3 mg    Description   Hold for 3 days then take 3 mg daily recheck in 2 weeks

## 2016-12-12 ENCOUNTER — Encounter: Payer: Self-pay | Admitting: Cardiovascular Disease

## 2016-12-12 ENCOUNTER — Ambulatory Visit: Payer: Medicare Other | Admitting: Cardiovascular Disease

## 2016-12-12 VITALS — BP 118/72 | HR 91 | Ht 65.0 in | Wt 233.2 lb

## 2016-12-12 DIAGNOSIS — I428 Other cardiomyopathies: Secondary | ICD-10-CM

## 2016-12-12 DIAGNOSIS — I5042 Chronic combined systolic (congestive) and diastolic (congestive) heart failure: Secondary | ICD-10-CM

## 2016-12-12 DIAGNOSIS — Z7901 Long term (current) use of anticoagulants: Secondary | ICD-10-CM | POA: Diagnosis not present

## 2016-12-12 DIAGNOSIS — I4821 Permanent atrial fibrillation: Secondary | ICD-10-CM

## 2016-12-12 DIAGNOSIS — I482 Chronic atrial fibrillation: Secondary | ICD-10-CM

## 2016-12-12 DIAGNOSIS — E782 Mixed hyperlipidemia: Secondary | ICD-10-CM | POA: Diagnosis not present

## 2016-12-12 DIAGNOSIS — E1159 Type 2 diabetes mellitus with other circulatory complications: Secondary | ICD-10-CM | POA: Diagnosis not present

## 2016-12-12 DIAGNOSIS — Z794 Long term (current) use of insulin: Secondary | ICD-10-CM

## 2016-12-12 DIAGNOSIS — I25118 Atherosclerotic heart disease of native coronary artery with other forms of angina pectoris: Secondary | ICD-10-CM

## 2016-12-12 DIAGNOSIS — R0602 Shortness of breath: Secondary | ICD-10-CM

## 2016-12-12 NOTE — Progress Notes (Signed)
Cardiology Office Note  Date:  12/12/2016   ID:  Tracy, Mann 05-Apr-1933, MRN [id number]  PCP:  Tracy Mann., MD   Chief Complaint  Patient presents with  . other    6 month f/u c/o heart palpitations and legs pain. Meds reviewed verbally with pt.    HPI:  [name] is a 83 year old woman with a PMH significant for  chronic atrial fibrillation on Coumadin therapy,  diastolic dysfunction, EF 40 to 45%  hypertension,  hyperlipidemia,  morbid obesity,   moderate pulmonary hypertension inferior wall myocardial infarction felt to be secondary to a thrombus from atrial fib in October 2007 at [facility],   episodes of chest pain  who presents for followup Of her coronary artery disease and leg edema.  In follow-up today she reports developing left side chest pain in the office  She was sitting down on the exam table when she developed left pain under her left breast, bottom of the ribs radiating up through her mediastinum into her neck and jaw bilaterally She reports that she Did not eat lunch Symptoms are better standing up, radiating to neck and ears Pain started to ease off during her doing notes visit Then drank Dr. Malachi Bonds Pain improved  She is taking Lasix 1 to 2 a day depending on degree of swelling Weight down 6 pounds  Lab work reviewed with her in detail Total chol 162, LDL 85 HBA1C 7.4  No regular exercise program,  No recent falls. Denies any chest pain, palpitations  EKG personally reviewed by myself on todays visit shows atrial fibrillation with ventricular rate 91 bpm, left axis deviation  Other past medical history reviewed In follow-up today, she reports that she had an episode of chest pain 05/21/2015 on the left side She went to the emergency room, was admitted. Hospital records reviewed. Cardiac enzymes were unrevealing, EKG unchanged. She had stress Myoview showing old inferior MI with no ischemia consistent with her  known anatomy. She did not take nitroglycerin for her symptoms.  Previously seen by vein and vascular, Dr. Ronalee Belts, recommended to wear TED hose or ACE wraps It was mentioned that perhaps she could wear lymphedema compression pump. She does not have this yet She does sit down during the daytime with her legs down, unable to get her legs up very high Difficulty touching her toes, unable to put compression hose on  diabetes, currently on insulin. Followed by Dr. Blossom Hoops. Previous echocardiogram showing ejection fraction 35-40% in 2014 Follow-up cardiac catheterization showing ejection fraction 45-50%  Continued chronic knee problems, difficulty walking long distances  Other past medical history On a previous visit, she had worsening shortness of breath, poor energy.  Echocardiogram was done that showed moderately depressed ejection fraction of 35%. Previously she had been 50%. She felt poorly  with shortness of breath and fatigue with any exertion.   Cardiac catheterization was performed to rule out ischemia as a cause of her depressed ejection fraction. This was done on 12/05/2012. She had a right dominant coronary arterial system with mild luminal irregularities, no significant stenoses that could contribute to her depressed ejection fraction. EF was estimated at 45%. There was an aneurysmal/severely hypokinetic region in the distal inferior and apical inferior wall from old MI 2007.   Previous  echocardiogram several years ago showed ejection fraction 50%. Moderately elevated right ventricular systolic pressures. Previous 48-hour Holter monitor, which showed that she was in persistent atrial fibrillation with one episode of  rapid ventricular response.      total cholesterol 198, triglycerides 300, LDL 104   PMH:   has a past medical history of Acute myocardial infarction of inferior wall (HCC), Chronic anticoagulation, Chronic atrial fibrillation (HCC), Chronic combined systolic and  diastolic CHF (congestive heart failure) ([place]), Essential hypertension, Hyperlipidemia, mixed, Mixed Ischemic/Nonischemic Cardiomyopathy, Obesity, Thyroid disease, Type II diabetes mellitus ([place]), and Vitamin D deficiency.  PSH:    Past Surgical History:  Procedure Laterality Date  . CARDIAC CATHETERIZATION  11/2012   [facility];EF 45-50%  . CHOLECYSTECTOMY  1999  . TUBAL LIGATION  1968  . VESICOVAGINAL FISTULA CLOSURE W/ TAH  1996    Current Outpatient Medications  Medication Sig Dispense Refill  . ACCU-CHEK AVIVA PLUS test strip     . COMBIVENT RESPIMAT 20-100 MCG/ACT AERS respimat 1 puff every 6 (six) hours as needed.     . ezetimibe (ZETIA) 10 MG tablet Take 1 tablet (10 mg total) by mouth daily. 90 tablet 3  . fluticasone (FLONASE) 50 MCG/ACT nasal spray SPRAY TWO SPRAYS IN EACH NOSTRIL ONCE DAILY 16 g 12  . furosemide (LASIX) 20 MG tablet Take 1 tablet (20 mg total) by mouth 2 (two) times daily as needed. 60 tablet 11  . HYDROcodone-acetaminophen (NORCO) 10-325 MG tablet Take 1 tablet by mouth every 6 (six) hours as needed. 120 tablet 0  . insulin glargine (LANTUS) 100 UNIT/ML injection Inject 22 Units into the skin at bedtime.    . insulin lispro (HUMALOG) 100 UNIT/ML KiwkPen Inject up to 10 units three times daily before each meal as directed    . levothyroxine (SYNTHROID, LEVOTHROID) 125 MCG tablet Take 125 mcg by mouth daily before breakfast.    . lisinopril (PRINIVIL,ZESTRIL) 2.5 MG tablet Take 2.5 mg by mouth every evening.    Marland Kitchen LORazepam (ATIVAN) 1 MG tablet TAKE 1 TABLET BY MOUTH TWICE A DAY AS NEEDED FOR ANXIETY 60 tablet 3  . Magnesium 500 MG CAPS Take 500 mg by mouth daily.    . metoprolol succinate (TOPROL-XL) 25 MG 24 hr tablet Take 12.5 mg by mouth 2 (two) times daily.    . nitrofurantoin, macrocrystal-monohydrate, (MACROBID) 100 MG capsule TAKE 1 CAPSULE (100 MG TOTAL) BY MOUTH DAILY. 30 capsule 10  . nitroGLYCERIN (NITROSTAT) 0.4 MG SL tablet PACE 1 TABLET UNDER TONGUE  EVERY 5 MINUTES AS NEEDED MAY REPEAT FOR UP TO 3 DOSES 25 tablet 0  . rOPINIRole (REQUIP) 0.25 MG tablet TAKE 1 TABLET BY MOUTH EVERYDAY AT BEDTIME 30 tablet 5  . vitamin B-12 (CYANOCOBALAMIN) 1000 MCG tablet Take 1,000 mcg by mouth daily.    . Vitamin D, Ergocalciferol, (DRISDOL) 50000 units CAPS capsule Take 50,000 Units by mouth.     . warfarin (COUMADIN) 2 MG tablet Take 1 tablet (2 mg total) by mouth daily. 30 tablet 3  . warfarin (COUMADIN) 3 MG tablet TAKE 1 TABLET (3 MG TOTAL) BY MOUTH DAILY. 30 tablet 12  . warfarin (COUMADIN) 4 MG tablet Take 1 tablet (4 mg total) by mouth daily. 30 tablet 12   No current facility-administered medications for this visit.      Allergies:   Other; Sulfa antibiotics; Clarithromycin; Diphenhydramine; Estrogens conjugated; Estrogens, conjugated; Sulfonamide derivatives; and Nitrofurantoin   Social History:  The patient  reports that  has never smoked. she has never used smokeless tobacco. She reports that she does not drink alcohol or use drugs.   Family History:   family history includes Heart disease in her mother; Thyroid  disease in her father.    Review of Systems: Review of Systems  Constitutional: Negative.   Respiratory: Negative.   Cardiovascular: Positive for chest pain.  Gastrointestinal: Negative.   Musculoskeletal: Negative.   Neurological: Negative.   Psychiatric/Behavioral: Negative.   All other systems reviewed and are negative.    PHYSICAL EXAM: VS:  BP 118/72 (BP Location: Left Arm, Patient Position: Sitting, Cuff Size: Large)   Pulse 91   Ht 5\' 5"  (1.651 m)   Wt 233 lb 4 oz (105.8 kg)   BMI 38.81 kg/m  , BMI Body mass index is 38.81 kg/m.  GEN: Well nourished, well developed, in no acute distress, obese  HEENT: normal  Neck: no JVD, carotid bruits, or masses Cardiac: Irregular rate and rhythm, no murmurs, rubs, or gallops, trace lower extremity edema, pitting bilaterally Respiratory:  clear to auscultation  bilaterally, normal work of breathing GI: soft, nontender, nondistended, + BS MS: no deformity or atrophy  Skin: warm and dry, no rash Neuro:  Strength and sensation are intact Psych: euthymic mood, full affect    Recent Labs: 04/04/2016: ALT 16; BUN 17; Creatinine, Ser 0.84; Hemoglobin 13.1; Platelets 261; Potassium 5.4; Sodium 141    Lipid Panel Lab Results  Component Value Date   CHOL 162 04/04/2016   HDL 40 04/04/2016   LDLCALC 85 04/04/2016   TRIG 186 (H) 04/04/2016      Wt Readings from Last 3 Encounters:  12/12/16 233 lb 4 oz (105.8 kg)  09/19/16 239 lb (108.4 kg)  06/19/16 237 lb (107.5 kg)       ASSESSMENT AND PLAN: Chest pain Atypical chest pain on today's visit in the office improved with belching and drinking a soda Reports having similar symptoms in the past No further workup at this time as she is back to her baseline at the end of our visit  Mixed hyperlipidemia Unable to tolerate statins, tolerating zetia  Total chol 160.  Stable, no changes to her medications  Essential hypertension - Plan: EKG 12-Lead Blood pressure is well controlled on today's visit. No changes made to the medications. Stable  Atherosclerosis of native coronary artery of native heart without angina pectoris Currently with no symptoms of angina. No further workup at this time. Continue current medication regimen.  Atypical chest pain on today's visit in the office improved with carbonated soda  Mixed Ischemic/Nonischemic Cardiomyopathy - Plan: EKG 12-Lead Recommended she take 1-2 Lasix per day for leg swelling, shortness of breath Weight is down 6 pounds  Chronic atrial fibrillation (HCC) - Plan: EKG 12-Lead Tolerating warfarin, rate well controlled No recent falls  Chronic combined systolic and diastolic CHF (congestive heart failure) (HCC) weight stable, SOB better with extra lasix, she is taking 1-2 as needed daily  Shortness of breath - Plan: EKG 12-Lead Secondary to  diastolic CHF and sleep apnea, atrial fibrillation Stable on lasix  Obstructive sleep apnea  Type 2 diabetes mellitus without complication, with long-term current use of insulin ([place]) Managed by Dr. Eddie Dibbles, HBA1C 7.4   Total encounter time more than 25 minutes  Greater than 50% was spent in counseling and coordination of care with the patient   Disposition:   F/U  12 months   Orders Placed This Encounter  Procedures  . EKG 12-Lead     Signed, Esmond Plants, M.D., Ph.D. 12/12/2016  [place], [place]

## 2016-12-12 NOTE — Patient Instructions (Addendum)

## 2016-12-15 ENCOUNTER — Ambulatory Visit: Payer: Self-pay

## 2016-12-15 ENCOUNTER — Other Ambulatory Visit: Payer: Self-pay

## 2016-12-15 DIAGNOSIS — I4821 Permanent atrial fibrillation: Secondary | ICD-10-CM

## 2016-12-15 DIAGNOSIS — I482 Chronic atrial fibrillation: Secondary | ICD-10-CM | POA: Diagnosis not present

## 2016-12-16 LAB — PROTIME-INR
INR: 1.8 — AB
PROTHROMBIN TIME: 18.9 s — AB (ref 9.0–11.5)

## 2016-12-25 ENCOUNTER — Other Ambulatory Visit: Payer: Self-pay | Admitting: Family Medicine

## 2016-12-25 DIAGNOSIS — M150 Primary generalized (osteo)arthritis: Principal | ICD-10-CM

## 2016-12-25 DIAGNOSIS — M159 Polyosteoarthritis, unspecified: Secondary | ICD-10-CM

## 2016-12-25 NOTE — Telephone Encounter (Signed)
Pt contacted office for refill request on the following medications:  HYDROcodone-acetaminophen (NORCO) 10-325 MG tablet  CB#[phone]/MW

## 2016-12-27 ENCOUNTER — Ambulatory Visit: Payer: Self-pay | Admitting: Family Medicine

## 2016-12-27 MED ORDER — HYDROCODONE-ACETAMINOPHEN 10-325 MG PO TABS: 1.0000 | ORAL_TABLET | Freq: Four times a day (QID) | ORAL | 0 refills | Status: DC | PRN

## 2016-12-27 NOTE — Telephone Encounter (Signed)
Pt called back to see if Rx for HYDROcodone-acetaminophen (NORCO) 10-325 MG tablet was ready. Pt stated that she only has 3 pills left and since her appt for this afternoon was canceled due to Dr. Rosanna Randy being out of the office that she wasn't going to be able to get her medication. Pt stated that she needs the medication today. Please advise. Thanks TNP

## 2017-01-03 ENCOUNTER — Ambulatory Visit: Payer: Medicare Other | Admitting: Family Medicine

## 2017-01-03 ENCOUNTER — Encounter: Payer: Self-pay | Admitting: Family Medicine

## 2017-01-03 VITALS — BP 138/64 | HR 80 | Temp 98.0°F | Resp 18 | Wt 234.6 lb

## 2017-01-03 DIAGNOSIS — E119 Type 2 diabetes mellitus without complications: Secondary | ICD-10-CM

## 2017-01-03 DIAGNOSIS — Z794 Long term (current) use of insulin: Secondary | ICD-10-CM | POA: Diagnosis not present

## 2017-01-03 DIAGNOSIS — I482 Chronic atrial fibrillation, unspecified: Secondary | ICD-10-CM

## 2017-01-03 DIAGNOSIS — M150 Primary generalized (osteo)arthritis: Secondary | ICD-10-CM | POA: Diagnosis not present

## 2017-01-03 DIAGNOSIS — M159 Polyosteoarthritis, unspecified: Secondary | ICD-10-CM

## 2017-01-03 DIAGNOSIS — G894 Chronic pain syndrome: Secondary | ICD-10-CM | POA: Diagnosis not present

## 2017-01-03 LAB — POCT INR
INR: 2.3
PT: 27.1

## 2017-01-03 MED ORDER — HYDROCODONE-ACETAMINOPHEN 10-325 MG PO TABS: 1.0000 | ORAL_TABLET | Freq: Four times a day (QID) | ORAL | 0 refills | Status: DC | PRN

## 2017-01-03 NOTE — Progress Notes (Signed)
Tracy Mann  MRN: [id number] DOB: 1933-10-22  Subjective:  HPI  Patient is here for routine 4 months follow up.  Patient is seen endocrinologist for diabetes follow up. Pain: patient does need refill on Norco. Routine lab work was done on 04/04/16.  Patient does not get flu shots.  Coumadin management: patient's last INR was on 12/15/16 done through lab work but patient did not get a call about it. INR today is 2.3, patient taking Warfarin 3 mg daily.  Patient Active Problem List   Diagnosis Date Noted  . Angina pectoris ([place]) 06/07/2015  . Angina at rest [facility]) 05/20/2015  . Weakness 03/03/2015  . Mixed Ischemic/Nonischemic Cardiomyopathy   . Type II diabetes mellitus ([place])   . Chronic atrial fibrillation ([place])   . Chronic combined systolic and diastolic CHF (congestive heart failure) ([place])   . Chronic anticoagulation   . Absolute anemia 07/08/2014  . Airway hyperreactivity 07/08/2014  . Cervical muscle strain 07/08/2014  . PNA (pneumonia) 07/08/2014  . Chest pressure 07/08/2014  . Clinical depression 07/08/2014  . Degeneration of lumbar or lumbosacral intervertebral disc 07/08/2014  . Accumulation of fluid in tissues 07/08/2014  . Anxiety, generalized 07/08/2014  . Acid reflux 07/08/2014  . Folliculitis 86/57/8469  . Adult hypothyroidism 07/08/2014  . Broken leg 07/08/2014  . Cannot sleep 07/08/2014  . Obstructive sleep apnea 07/08/2014  . Arthritis, degenerative 07/08/2014  . Onychia of finger 07/08/2014  . Psoriasis 07/08/2014  . Restless legs syndrome 07/08/2014  . Adult BMI 30+ 07/08/2014  . Calculus of kidney 10/31/2011  . Bladder infection, chronic 10/31/2011  . Incomplete bladder emptying 10/31/2011  . Obesity 11/02/2010  . SHORTNESS OF BREATH 02/01/2010  . Hyperlipidemia 09/16/2008  . CAD, NATIVE VESSEL 09/16/2008  . Atrial fibrillation ([place]) 09/16/2008    Past Medical History:  Diagnosis Date  . Acute myocardial infarction of inferior wall ([place])     a. 10/2005 - Presumed to be 2/2 to a coronary embolus in setting of persistent Afib-->Cath relatively nl cors, EF 45-50% w/ severe distal inferior/apical inferior HK w/ aneursymal appearance.  . Chronic anticoagulation    a. warfarin.  . Chronic atrial fibrillation (HCC)    a. CHA2DS2VASc = 6--> warfarin.  . Chronic combined systolic and diastolic CHF (congestive heart failure) ([place])    a. 10/2012 Echo: EF 35-40%, diff HK, mild MR, mild biatrial enlargement;  b. 11/2012 EF 45-50% by LV gram; c. echo 07/2014: EF 40-45%, mod conc LVH, mod MR, severe biatrial enlargement, PASP 45 mm Hg  . Essential hypertension   . Hyperlipidemia, mixed   . Mixed Ischemic/Nonischemic Cardiomyopathy    a. 10/2012 Echo: EF 35-40%;  b. 11/2012 EF 45-50% by LV gram.  . Obesity   . Thyroid disease    hypothyroidism  . Type II diabetes mellitus ([place])   . Vitamin D deficiency     Social History   Socioeconomic History  . Marital status: Widowed    Spouse name: widowed  . Number of children: 4  . Years of education: 3  . Highest education level: Not on file  Social Needs  . Financial resource strain: Not on file  . Food insecurity - worry: Not on file  . Food insecurity - inability: Not on file  . Transportation needs - medical: Not on file  . Transportation needs - non-medical: Not on file  Occupational History  . Occupation: retired  Tobacco Use  . Smoking status: Never Smoker  . Smokeless tobacco: Never Used  Substance and Sexual Activity  . Alcohol use: No  . Drug use: No  . Sexual activity: No  Other Topics Concern  . Not on file  Social History Narrative   Widowed   Has children and grandchildren   Lives in [place]    Outpatient Encounter Medications as of 01/03/2017  Medication Sig  . ACCU-CHEK AVIVA PLUS test strip   . COMBIVENT RESPIMAT 20-100 MCG/ACT AERS respimat 1 puff every 6 (six) hours as needed.   . ezetimibe (ZETIA) 10 MG tablet Take 1 tablet (10 mg total) by mouth  daily.  . fluticasone (FLONASE) 50 MCG/ACT nasal spray SPRAY TWO SPRAYS IN EACH NOSTRIL ONCE DAILY  . furosemide (LASIX) 20 MG tablet Take 1 tablet (20 mg total) by mouth 2 (two) times daily as needed.  Marland Kitchen HYDROcodone-acetaminophen (NORCO) 10-325 MG tablet Take 1 tablet by mouth every 6 (six) hours as needed.  . insulin glargine (LANTUS) 100 UNIT/ML injection Inject 22 Units into the skin at bedtime.  . insulin lispro (HUMALOG) 100 UNIT/ML KiwkPen Inject up to 10 units three times daily before each meal as directed  . levothyroxine (SYNTHROID, LEVOTHROID) 125 MCG tablet Take 125 mcg by mouth daily before breakfast.  . lisinopril (PRINIVIL,ZESTRIL) 2.5 MG tablet Take 2.5 mg by mouth every evening.  Marland Kitchen LORazepam (ATIVAN) 1 MG tablet TAKE 1 TABLET BY MOUTH TWICE A DAY AS NEEDED FOR ANXIETY  . Magnesium 500 MG CAPS Take 500 mg by mouth daily.  . metoprolol succinate (TOPROL-XL) 25 MG 24 hr tablet Take 12.5 mg by mouth 2 (two) times daily.  . nitrofurantoin, macrocrystal-monohydrate, (MACROBID) 100 MG capsule TAKE 1 CAPSULE (100 MG TOTAL) BY MOUTH DAILY.  . nitroGLYCERIN (NITROSTAT) 0.4 MG SL tablet PACE 1 TABLET UNDER TONGUE EVERY 5 MINUTES AS NEEDED MAY REPEAT FOR UP TO 3 DOSES  . rOPINIRole (REQUIP) 0.25 MG tablet TAKE 1 TABLET BY MOUTH EVERYDAY AT BEDTIME  . vitamin B-12 (CYANOCOBALAMIN) 1000 MCG tablet Take 1,000 mcg by mouth daily.  . Vitamin D, Ergocalciferol, (DRISDOL) 50000 units CAPS capsule Take 50,000 Units by mouth.   . warfarin (COUMADIN) 2 MG tablet Take 1 tablet (2 mg total) by mouth daily.  Marland Kitchen warfarin (COUMADIN) 3 MG tablet TAKE 1 TABLET (3 MG TOTAL) BY MOUTH DAILY.  Marland Kitchen warfarin (COUMADIN) 4 MG tablet Take 1 tablet (4 mg total) by mouth daily.   No facility-administered encounter medications on file as of 01/03/2017.     Allergies  Allergen Reactions  . Other Anaphylaxis and Swelling    'tongue swelling'  . Sulfa Antibiotics Anaphylaxis  . Clarithromycin Other (See Comments)     Hives, headaches, hard time swelling, felt like throat was closing up Hives, headaches, hard time swelling, felt like throat was closing up  . Diphenhydramine Other (See Comments)  . Estrogens Conjugated   . Estrogens, Conjugated Other (See Comments)  . Sulfonamide Derivatives Swelling    'tongue swelling'  . Nitrofurantoin Nausea Only    Review of Systems  Constitutional: Positive for malaise/fatigue.  HENT: Negative.   Eyes: Negative.   Respiratory: Negative.   Cardiovascular: Negative.   Gastrointestinal: Negative.   Musculoskeletal: Positive for back pain, joint pain and myalgias.  Skin: Negative.   Neurological: Negative.   Endo/Heme/Allergies: Negative.   Psychiatric/Behavioral: Negative.     Objective:  BP 138/64   Pulse 80   Temp 98 F (36.7 C)   Resp 18   Wt 234 lb 9.6 oz (106.4 kg)   BMI 39.04 kg/m  Physical Exam  Constitutional: She is oriented to person, place, and time and well-developed, well-nourished, and in no distress.  Obese WF NAD.  HENT:  Head: Normocephalic and atraumatic.  Eyes: Conjunctivae are normal. No scleral icterus.  Neck: No thyromegaly present.  Cardiovascular: Normal rate, regular rhythm and normal heart sounds.  Pulmonary/Chest: Effort normal.  Abdominal: Soft.  Neurological: She is alert and oriented to person, place, and time. Gait normal. GCS score is 15.  Skin: Skin is warm and dry.  Very fair skin.  Psychiatric: Mood, memory, affect and judgment normal.    Assessment and Plan :  1. Chronic atrial fibrillation (HCC)  - POCT INR  2. Primary osteoarthritis involving multiple joints  - HYDROcodone-acetaminophen (NORCO) 10-325 MG tablet; Take 1 tablet by mouth every 6 (six) hours as needed.  Dispense: 120 tablet; Refill: 0  3. Chronic pain syndrome Encouraged pt to cut back on narcotic use.  4. Type 2 diabetes mellitus without complication, with long-term current use of insulin ([place])  I have done the exam and reviewed  the chart and it is accurate to the best of my knowledge. Development worker, community has been used and  any errors in dictation or transcription are unintentional. Miguel Aschoff M.D. [facility]

## 2017-01-03 NOTE — Patient Instructions (Signed)
Description   Take 3 mg once daily. Re check in 4 weeks.

## 2017-01-08 ENCOUNTER — Ambulatory Visit: Payer: Self-pay | Admitting: Family Medicine

## 2017-01-31 ENCOUNTER — Ambulatory Visit: Payer: Medicare Other | Admitting: Family Medicine

## 2017-01-31 ENCOUNTER — Ambulatory Visit: Payer: Medicare Other

## 2017-01-31 ENCOUNTER — Encounter: Payer: Self-pay | Admitting: Family Medicine

## 2017-01-31 VITALS — BP 134/72 | HR 94 | Temp 98.6°F | Resp 18 | Wt 236.0 lb

## 2017-01-31 DIAGNOSIS — J40 Bronchitis, not specified as acute or chronic: Secondary | ICD-10-CM | POA: Diagnosis not present

## 2017-01-31 DIAGNOSIS — I482 Chronic atrial fibrillation, unspecified: Secondary | ICD-10-CM

## 2017-01-31 LAB — POCT INR
INR: 3.1
PT: 37.6

## 2017-01-31 MED ORDER — LEVOFLOXACIN 750 MG PO TABS: 750.0000 mg | ORAL_TABLET | Freq: Every day | ORAL | 0 refills | Status: AC

## 2017-01-31 NOTE — Patient Instructions (Signed)
Description   Dx: Atrial Fibrillation Current coumadin dose: 3mg  QD PT:37.6 INR:3.1 Today's Changes: NO CHANGE Recheck: 4 weeks

## 2017-01-31 NOTE — Progress Notes (Signed)
Patient: Tracy Mann Female    DOB: Aug 25, 1933   84 y.o.   MRN: [id number] Visit Date: 01/31/2017  Today's Provider: Lelon Huh, MD   Chief Complaint  Patient presents with  . URI    x 1 week   Subjective:    URI   This is a new problem. Episode onset: 10 days ago. The problem has been gradually worsening. Maximum temperature: some fever per patient report. Associated symptoms include chest pain, congestion (nasal, head and chest congestion), coughing (productive with green sputum), diarrhea, ear pain, headaches, a plugged ear sensation, rhinorrhea, sinus pain, sneezing and wheezing. Pertinent negatives include no abdominal pain, dysuria, nausea, sore throat or vomiting.  Patient reports her home oxygen levels have been as low as 85% (room air).     Allergies  Allergen Reactions  . Other Anaphylaxis and Swelling    'tongue swelling'  . Sulfa Antibiotics Anaphylaxis  . Clarithromycin Other (See Comments)    Hives, headaches, hard time swelling, felt like throat was closing up Hives, headaches, hard time swelling, felt like throat was closing up  . Diphenhydramine Other (See Comments)  . Estrogens Conjugated   . Estrogens, Conjugated Other (See Comments)  . Sulfonamide Derivatives Swelling    'tongue swelling'  . Nitrofurantoin Nausea Only     Current Outpatient Medications:  .  ACCU-CHEK AVIVA PLUS test strip, , Disp: , Rfl:  .  COMBIVENT RESPIMAT 20-100 MCG/ACT AERS respimat, 1 puff every 6 (six) hours as needed. , Disp: , Rfl:  .  ezetimibe (ZETIA) 10 MG tablet, Take 1 tablet (10 mg total) by mouth daily., Disp: 90 tablet, Rfl: 3 .  fluticasone (FLONASE) 50 MCG/ACT nasal spray, SPRAY TWO SPRAYS IN EACH NOSTRIL ONCE DAILY, Disp: 16 g, Rfl: 12 .  furosemide (LASIX) 20 MG tablet, Take 1 tablet (20 mg total) by mouth 2 (two) times daily as needed., Disp: 60 tablet, Rfl: 11 .  HYDROcodone-acetaminophen (NORCO) 10-325 MG tablet, Take 1 tablet by mouth every 6 (six)  hours as needed., Disp: 120 tablet, Rfl: 0 .  insulin lispro (HUMALOG) 100 UNIT/ML KiwkPen, Inject up to 10 units three times daily before each meal as directed, Disp: , Rfl:  .  levothyroxine (SYNTHROID, LEVOTHROID) 125 MCG tablet, Take 125 mcg by mouth daily before breakfast., Disp: , Rfl:  .  lisinopril (PRINIVIL,ZESTRIL) 2.5 MG tablet, Take 2.5 mg by mouth every evening., Disp: , Rfl:  .  LORazepam (ATIVAN) 1 MG tablet, TAKE 1 TABLET BY MOUTH TWICE A DAY AS NEEDED FOR ANXIETY, Disp: 60 tablet, Rfl: 3 .  Magnesium 500 MG CAPS, Take 500 mg by mouth daily., Disp: , Rfl:  .  metoprolol succinate (TOPROL-XL) 25 MG 24 hr tablet, Take 12.5 mg by mouth 2 (two) times daily., Disp: , Rfl:  .  nitrofurantoin, macrocrystal-monohydrate, (MACROBID) 100 MG capsule, TAKE 1 CAPSULE (100 MG TOTAL) BY MOUTH DAILY., Disp: 30 capsule, Rfl: 10 .  nitroGLYCERIN (NITROSTAT) 0.4 MG SL tablet, PACE 1 TABLET UNDER TONGUE EVERY 5 MINUTES AS NEEDED MAY REPEAT FOR UP TO 3 DOSES, Disp: 25 tablet, Rfl: 0 .  rOPINIRole (REQUIP) 0.25 MG tablet, TAKE 1 TABLET BY MOUTH EVERYDAY AT BEDTIME, Disp: 30 tablet, Rfl: 5 .  vitamin B-12 (CYANOCOBALAMIN) 1000 MCG tablet, Take 1,000 mcg by mouth daily., Disp: , Rfl:  .  Vitamin D, Ergocalciferol, (DRISDOL) 50000 units CAPS capsule, Take 50,000 Units by mouth. , Disp: , Rfl:  .  warfarin (COUMADIN) 2  MG tablet, Take 1 tablet (2 mg total) by mouth daily., Disp: 30 tablet, Rfl: 3 .  warfarin (COUMADIN) 3 MG tablet, TAKE 1 TABLET (3 MG TOTAL) BY MOUTH DAILY., Disp: 30 tablet, Rfl: 12 .  warfarin (COUMADIN) 4 MG tablet, Take 1 tablet (4 mg total) by mouth daily., Disp: 30 tablet, Rfl: 12 .  insulin glargine (LANTUS) 100 UNIT/ML injection, Inject 22 Units into the skin at bedtime., Disp: , Rfl:   Review of Systems  Constitutional: Positive for chills, diaphoresis, fatigue and fever. Negative for appetite change.  HENT: Positive for congestion (nasal, head and chest congestion), ear pain,  postnasal drip, rhinorrhea, sinus pressure, sinus pain and sneezing. Negative for sore throat.   Respiratory: Positive for cough (productive with green sputum), shortness of breath and wheezing. Negative for chest tightness.   Cardiovascular: Positive for chest pain. Negative for palpitations.  Gastrointestinal: Positive for diarrhea. Negative for abdominal pain, nausea and vomiting.  Genitourinary: Negative for dysuria.  Neurological: Positive for dizziness and headaches. Negative for weakness.    Social History   Tobacco Use  . Smoking status: Never Smoker  . Smokeless tobacco: Never Used  Substance Use Topics  . Alcohol use: No   Objective:   BP 134/72 (BP Location: Left Arm, Patient Position: Sitting, Cuff Size: Large)   Pulse 94   Temp 98.6 F (37 C) (Oral)   Resp 18   Wt 236 lb (107 kg)   SpO2 94% Comment: room air  BMI 39.27 kg/m  There were no vitals filed for this visit.   Physical Exam  General Appearance:    Alert, cooperative, no distress, well appearing  HENT:   bilateral TM fluid noted, neck without nodes, pharynx erythematous without exudate and nasal mucosa pale and congested, post nasal drainage noted.   Eyes:    PERRL, conjunctiva/corneas clear, EOM's intact       Lungs:     Occasional expiratory wheeze, no rales, , respirations unlabored  Heart:    Regular rate and rhythm  Neurologic:   Awake, alert, oriented x 3. No apparent focal neurological           defect.           Assessment & Plan:     1. Chronic atrial fibrillation (HCC)  - POCT INR  2. Bronchitis  - levofloxacin (LEVAQUIN) 750 MG tablet; Take 1 tablet (750 mg total) by mouth daily for 7 days.  Dispense: 7 tablet; Refill: 0  Call if symptoms change or if not rapidly improving.          Lelon Huh, MD  [facility]

## 2017-02-05 ENCOUNTER — Other Ambulatory Visit: Payer: Self-pay | Admitting: Family Medicine

## 2017-02-06 NOTE — Telephone Encounter (Signed)
Pharmacy requesting refills. Thanks!  

## 2017-02-07 ENCOUNTER — Other Ambulatory Visit: Payer: Self-pay | Admitting: Family Medicine

## 2017-02-16 DIAGNOSIS — E039 Hypothyroidism, unspecified: Secondary | ICD-10-CM | POA: Diagnosis not present

## 2017-02-16 DIAGNOSIS — E785 Hyperlipidemia, unspecified: Secondary | ICD-10-CM | POA: Diagnosis not present

## 2017-02-16 DIAGNOSIS — Z794 Long term (current) use of insulin: Secondary | ICD-10-CM | POA: Diagnosis not present

## 2017-02-16 DIAGNOSIS — E119 Type 2 diabetes mellitus without complications: Secondary | ICD-10-CM | POA: Diagnosis not present

## 2017-02-20 DIAGNOSIS — E559 Vitamin D deficiency, unspecified: Secondary | ICD-10-CM | POA: Diagnosis not present

## 2017-02-20 DIAGNOSIS — E039 Hypothyroidism, unspecified: Secondary | ICD-10-CM | POA: Diagnosis not present

## 2017-02-20 DIAGNOSIS — E1165 Type 2 diabetes mellitus with hyperglycemia: Secondary | ICD-10-CM | POA: Diagnosis not present

## 2017-02-20 DIAGNOSIS — E781 Pure hyperglyceridemia: Secondary | ICD-10-CM | POA: Diagnosis not present

## 2017-02-21 ENCOUNTER — Encounter: Payer: Self-pay | Admitting: Family Medicine

## 2017-02-21 ENCOUNTER — Ambulatory Visit: Payer: Medicare Other | Admitting: Family Medicine

## 2017-02-21 VITALS — BP 142/80 | HR 68 | Temp 98.4°F | Resp 18 | Wt 234.0 lb

## 2017-02-21 DIAGNOSIS — R58 Hemorrhage, not elsewhere classified: Secondary | ICD-10-CM

## 2017-02-21 DIAGNOSIS — I482 Chronic atrial fibrillation, unspecified: Secondary | ICD-10-CM

## 2017-02-21 DIAGNOSIS — R0982 Postnasal drip: Secondary | ICD-10-CM

## 2017-02-21 LAB — POCT INR: INR: 2.7

## 2017-02-21 NOTE — Progress Notes (Signed)
Patient: Tracy Mann Female    DOB: 1933-10-26   84 y.o.   MRN: [id number] Visit Date: 02/21/2017  Today's Provider: Lelon Huh, MD   Chief Complaint  Patient presents with  . Laceration   Subjective:    Patient presents today reporting that she cut her leg yesterday while shaving her legs yesterday . She called EMS out to her home because she couldn't stop the bleeding. Patient states that they were able to stop the bleeding but she was told she may have lost about 50cc of blood. Since this encounter, patient has felt lightheaded. Has also had some head congestion and post nasal drainage for several days.      Allergies  Allergen Reactions  . Other Anaphylaxis and Swelling    'tongue swelling'  . Sulfa Antibiotics Anaphylaxis  . Clarithromycin Other (See Comments)    Hives, headaches, hard time swelling, felt like throat was closing up Hives, headaches, hard time swelling, felt like throat was closing up  . Diphenhydramine Other (See Comments)  . Estrogens Conjugated   . Estrogens, Conjugated Other (See Comments)  . Sulfonamide Derivatives Swelling    'tongue swelling'  . Nitrofurantoin Nausea Only     Current Outpatient Medications:  .  ACCU-CHEK AVIVA PLUS test strip, , Disp: , Rfl:  .  COMBIVENT RESPIMAT 20-100 MCG/ACT AERS respimat, 1 puff every 6 (six) hours as needed. , Disp: , Rfl:  .  ezetimibe (ZETIA) 10 MG tablet, Take 1 tablet (10 mg total) by mouth daily., Disp: 90 tablet, Rfl: 3 .  fluticasone (FLONASE) 50 MCG/ACT nasal spray, SPRAY TWO SPRAYS IN EACH NOSTRIL ONCE DAILY, Disp: 16 g, Rfl: 12 .  furosemide (LASIX) 20 MG tablet, Take 1 tablet (20 mg total) by mouth 2 (two) times daily as needed., Disp: 60 tablet, Rfl: 11 .  HYDROcodone-acetaminophen (NORCO) 10-325 MG tablet, Take 1 tablet by mouth every 6 (six) hours as needed., Disp: 120 tablet, Rfl: 0 .  insulin glargine (LANTUS) 100 UNIT/ML injection, Inject 22 Units into the skin at bedtime., Disp:  , Rfl:  .  insulin lispro (HUMALOG) 100 UNIT/ML KiwkPen, Inject up to 10 units three times daily before each meal as directed, Disp: , Rfl:  .  levothyroxine (SYNTHROID, LEVOTHROID) 125 MCG tablet, Take 125 mcg by mouth daily before breakfast., Disp: , Rfl:  .  lisinopril (PRINIVIL,ZESTRIL) 2.5 MG tablet, Take 2.5 mg by mouth every evening., Disp: , Rfl:  .  LORazepam (ATIVAN) 1 MG tablet, TAKE 1 TABLET BY MOUTH TWICE A DAY FOR ANXIETY, Disp: 60 tablet, Rfl: 2 .  Magnesium 500 MG CAPS, Take 500 mg by mouth daily., Disp: , Rfl:  .  metoprolol succinate (TOPROL-XL) 25 MG 24 hr tablet, Take 12.5 mg by mouth 2 (two) times daily., Disp: , Rfl:  .  nitroGLYCERIN (NITROSTAT) 0.4 MG SL tablet, PACE 1 TABLET UNDER TONGUE EVERY 5 MINUTES AS NEEDED MAY REPEAT FOR UP TO 3 DOSES, Disp: 25 tablet, Rfl: 0 .  rOPINIRole (REQUIP) 0.25 MG tablet, TAKE 1 TABLET BY MOUTH EVERYDAY AT BEDTIME, Disp: 30 tablet, Rfl: 5 .  vitamin B-12 (CYANOCOBALAMIN) 1000 MCG tablet, Take 1,000 mcg by mouth daily., Disp: , Rfl:  .  Vitamin D, Ergocalciferol, (DRISDOL) 50000 units CAPS capsule, Take 50,000 Units by mouth. , Disp: , Rfl:  .  warfarin (COUMADIN) 2 MG tablet, Take 1 tablet (2 mg total) by mouth daily., Disp: 30 tablet, Rfl: 3 .  warfarin (COUMADIN) 3  MG tablet, TAKE 1 TABLET (3 MG TOTAL) BY MOUTH DAILY., Disp: 30 tablet, Rfl: 12 .  warfarin (COUMADIN) 4 MG tablet, Take 1 tablet (4 mg total) by mouth daily., Disp: 30 tablet, Rfl: 12 .  nitrofurantoin, macrocrystal-monohydrate, (MACROBID) 100 MG capsule, TAKE 1 CAPSULE (100 MG TOTAL) BY MOUTH DAILY. (Patient not taking: Reported on 02/21/2017), Disp: 30 capsule, Rfl: 10  Review of Systems  Constitutional: Negative for appetite change, chills, fatigue and fever.  HENT: Positive for congestion.   Respiratory: Positive for cough. Negative for chest tightness and shortness of breath.   Cardiovascular: Negative for chest pain and palpitations.  Gastrointestinal: Negative for  abdominal pain, nausea and vomiting.  Skin:       Small abrasions on the left lower leg  Neurological: Positive for weakness and light-headedness. Negative for dizziness.    Social History   Tobacco Use  . Smoking status: Never Smoker  . Smokeless tobacco: Never Used  Substance Use Topics  . Alcohol use: No   Objective:   BP (!) 142/80 (BP Location: Left Arm, Patient Position: Sitting, Cuff Size: Large)   Pulse 68   Temp 98.4 F (36.9 C) (Oral)   Resp 18   Wt 234 lb (106.1 kg)   SpO2 97% Comment: room air  BMI 38.94 kg/m     Physical Exam  General appearance: alert, well developed, well nourished, cooperative and in no distress Head: Normocephalic, without obvious abnormality, atraumatic Respiratory: Respirations even and unlabored, normal respiratory rate Extremities: She indicates an area of skin around left medial knee as area that was bleeding. There are extensive spider veins in this area, but no bleeding and know cuts or scrapes.   Results for orders placed or performed in visit on 02/21/17  POCT INR  Result Value Ref Range   INR 2.7    PT         Assessment & Plan:     1. Bleeding Resolved today. She likely nicked a spider vein while shaving her legs yesterday No other skin lesions visible today.   2. Post-nasal drainage Patient Instructions  Take OTC cetirizine (Zyrtec) 10mg  once a day   3. Chronic atrial fibrillation (HCC) Continue current dose of warfarin.  - POCT INR       Lelon Huh, MD  [facility]

## 2017-02-21 NOTE — Patient Instructions (Signed)
Take OTC cetirizine (Zyrtec) 10mg  once a day

## 2017-02-28 ENCOUNTER — Ambulatory Visit: Payer: Self-pay

## 2017-03-06 DIAGNOSIS — E039 Hypothyroidism, unspecified: Secondary | ICD-10-CM | POA: Diagnosis not present

## 2017-03-06 DIAGNOSIS — E781 Pure hyperglyceridemia: Secondary | ICD-10-CM | POA: Diagnosis not present

## 2017-03-06 DIAGNOSIS — E559 Vitamin D deficiency, unspecified: Secondary | ICD-10-CM | POA: Diagnosis not present

## 2017-03-06 DIAGNOSIS — Z794 Long term (current) use of insulin: Secondary | ICD-10-CM | POA: Diagnosis not present

## 2017-03-06 DIAGNOSIS — E1165 Type 2 diabetes mellitus with hyperglycemia: Secondary | ICD-10-CM | POA: Diagnosis not present

## 2017-03-07 ENCOUNTER — Ambulatory Visit: Payer: Medicare Other | Admitting: Cardiovascular Disease

## 2017-03-12 ENCOUNTER — Other Ambulatory Visit: Payer: Self-pay | Admitting: Family Medicine

## 2017-03-14 ENCOUNTER — Ambulatory Visit (INDEPENDENT_AMBULATORY_CARE_PROVIDER_SITE_OTHER): Payer: Medicare Other | Admitting: Family Medicine

## 2017-03-14 ENCOUNTER — Ambulatory Visit (INDEPENDENT_AMBULATORY_CARE_PROVIDER_SITE_OTHER): Payer: Medicare Other

## 2017-03-14 VITALS — BP 138/70 | HR 72 | Temp 98.5°F | Ht 65.0 in | Wt 231.0 lb

## 2017-03-14 DIAGNOSIS — M159 Polyosteoarthritis, unspecified: Secondary | ICD-10-CM

## 2017-03-14 DIAGNOSIS — Z0000 Encounter for general adult medical examination without abnormal findings: Secondary | ICD-10-CM | POA: Diagnosis not present

## 2017-03-14 DIAGNOSIS — I482 Chronic atrial fibrillation, unspecified: Secondary | ICD-10-CM

## 2017-03-14 DIAGNOSIS — M150 Primary generalized (osteo)arthritis: Secondary | ICD-10-CM

## 2017-03-14 MED ORDER — HYDROCODONE-ACETAMINOPHEN 10-325 MG PO TABS: 1.0000 | ORAL_TABLET | Freq: Four times a day (QID) | ORAL | 0 refills | Status: DC | PRN

## 2017-03-14 NOTE — Progress Notes (Signed)
Subjective:   Tracy Mann is a 84 y.o. female who presents for Medicare Annual (Subsequent) preventive examination.  Review of Systems:  N/A  Cardiac Risk Factors include: advanced age (>32men, >107 women);diabetes mellitus;dyslipidemia;obesity (BMI >30kg/m2)     Objective:     Vitals: BP 138/70 (BP Location: Right Arm)   Pulse 72   Temp 98.5 F (36.9 C) (Oral)   Ht 5\' 5"  (1.651 m)   Wt 231 lb (104.8 kg)   BMI 38.44 kg/m   Body mass index is 38.44 kg/m.  Advanced Directives 03/14/2017 01/28/2016 12/27/2015 05/31/2015 05/20/2015 05/20/2015 11/13/2014  Does Patient Have a Medical Advance Directive? No Yes No No No No No  Type of Advance Directive - [place]  Would patient like information on creating a medical advance directive? No - Patient declined - - - No - patient declined information - -    Tobacco Social History   Tobacco Use  Smoking Status Never Smoker  Smokeless Tobacco Never Used     Counseling given: Not Answered   Clinical Intake:  Pre-visit preparation completed: Yes  Pain : No/denies pain Pain Score: 0-No pain     Nutritional Status: BMI > 30  Obese Nutritional Risks: None Diabetes: Yes(type 2) CBG done?: No Did pt. bring in CBG monitor from home?: No  How often do you need to have someone help you when you read instructions, pamphlets, or other written materials from your doctor or pharmacy?: 1 - Never  Interpreter Needed?: No  Information entered by :: [facility], LPN  Past Medical History:  Diagnosis Date  . Acute myocardial infarction of inferior wall ([place])    a. 10/2005 - Presumed to be 2/2 to a coronary embolus in setting of persistent Afib-->Cath relatively nl cors, EF 45-50% w/ severe distal inferior/apical inferior HK w/ aneursymal appearance.  . Chronic anticoagulation    a. warfarin.  . Chronic atrial fibrillation (HCC)    a. CHA2DS2VASc = 6--> warfarin.  . Chronic combined systolic and diastolic  CHF (congestive heart failure) ([place])    a. 10/2012 Echo: EF 35-40%, diff HK, mild MR, mild biatrial enlargement;  b. 11/2012 EF 45-50% by LV gram; c. echo 07/2014: EF 40-45%, mod conc LVH, mod MR, severe biatrial enlargement, PASP 45 mm Hg  . Essential hypertension   . Hyperlipidemia, mixed   . Mixed Ischemic/Nonischemic Cardiomyopathy    a. 10/2012 Echo: EF 35-40%;  b. 11/2012 EF 45-50% by LV gram.  . Obesity   . Thyroid disease    hypothyroidism  . Type II diabetes mellitus ([place])   . Vitamin D deficiency    Past Surgical History:  Procedure Laterality Date  . CARDIAC CATHETERIZATION  11/2012   [facility];EF 45-50%  . CHOLECYSTECTOMY  1999  . TUBAL LIGATION  1968  . VESICOVAGINAL FISTULA CLOSURE W/ TAH  1996   Family History  Problem Relation Age of Onset  . Heart disease Mother   . Thyroid disease Father    Social History   Socioeconomic History  . Marital status: Widowed    Spouse name: widowed  . Number of children: 4  . Years of education: 72  . Highest education level: 12th grade  Social Needs  . Financial resource strain: Not hard at all  . Food insecurity - worry: Never true  . Food insecurity - inability: Never true  . Transportation needs - medical: No  . Transportation needs - non-medical: No  Occupational History  .  Occupation: retired  Tobacco Use  . Smoking status: Never Smoker  . Smokeless tobacco: Never Used  Substance and Sexual Activity  . Alcohol use: No  . Drug use: No  . Sexual activity: No  Other Topics Concern  . None  Social History Narrative   Widowed   Has children and grandchildren   Lives in [place]    Outpatient Encounter Medications as of 03/14/2017  Medication Sig  . ACCU-CHEK AVIVA PLUS test strip   . COMBIVENT RESPIMAT 20-100 MCG/ACT AERS respimat 1 puff every 6 (six) hours as needed.   . ezetimibe (ZETIA) 10 MG tablet Take 1 tablet (10 mg total) by mouth daily.  . fluticasone (FLONASE) 50 MCG/ACT nasal spray SPRAY TWO SPRAYS IN  EACH NOSTRIL ONCE DAILY  . furosemide (LASIX) 20 MG tablet Take 1 tablet (20 mg total) by mouth 2 (two) times daily as needed.  Marland Kitchen HYDROcodone-acetaminophen (NORCO) 10-325 MG tablet Take 1 tablet by mouth every 6 (six) hours as needed.  . insulin lispro (HUMALOG) 100 UNIT/ML KiwkPen Inject up to 10-20 units three times daily before each meal as directed  . levothyroxine (SYNTHROID, LEVOTHROID) 125 MCG tablet Take 125 mcg by mouth daily before breakfast.  . lisinopril (PRINIVIL,ZESTRIL) 2.5 MG tablet TAKE 1 TABLET BY MOUTH AT BEDTIME  . LORazepam (ATIVAN) 1 MG tablet TAKE 1 TABLET BY MOUTH TWICE A DAY FOR ANXIETY  . Magnesium 500 MG CAPS Take 500 mg by mouth daily.   . metoprolol succinate (TOPROL-XL) 25 MG 24 hr tablet Take 12.5 mg by mouth 2 (two) times daily.  . nitrofurantoin, macrocrystal-monohydrate, (MACROBID) 100 MG capsule TAKE 1 CAPSULE (100 MG TOTAL) BY MOUTH DAILY. (Patient taking differently: Take 100 mg by mouth daily. )  . nitroGLYCERIN (NITROSTAT) 0.4 MG SL tablet PACE 1 TABLET UNDER TONGUE EVERY 5 MINUTES AS NEEDED MAY REPEAT FOR UP TO 3 DOSES  . rOPINIRole (REQUIP) 0.25 MG tablet TAKE 1 TABLET BY MOUTH EVERYDAY AT BEDTIME (Patient taking differently: TAKE 1 TABLET BY MOUTH EVERYDAY AT BEDTIME as needed)  . vitamin B-12 (CYANOCOBALAMIN) 1000 MCG tablet Take 1,000 mcg by mouth daily.  . Vitamin D, Ergocalciferol, (DRISDOL) 50000 units CAPS capsule Take 50,000 Units by mouth.   . warfarin (COUMADIN) 3 MG tablet TAKE 1 TABLET (3 MG TOTAL) BY MOUTH DAILY.  Marland Kitchen warfarin (COUMADIN) 2 MG tablet Take 1 tablet (2 mg total) by mouth daily. (Patient not taking: Reported on 03/14/2017)  . warfarin (COUMADIN) 4 MG tablet Take 1 tablet (4 mg total) by mouth daily. (Patient not taking: Reported on 03/14/2017)  . [DISCONTINUED] insulin glargine (LANTUS) 100 UNIT/ML injection Inject 22 Units into the skin at bedtime.  . [DISCONTINUED] lisinopril (PRINIVIL,ZESTRIL) 2.5 MG tablet Take 2.5 mg by mouth every  evening.   No facility-administered encounter medications on file as of 03/14/2017.     Activities of Daily Living In your present state of health, do you have any difficulty performing the following activities: 03/14/2017  Hearing? N  Vision? N  Difficulty concentrating or making decisions? N  Walking or climbing stairs? Y  Comment Due to SOB.  Dressing or bathing? N  Doing errands, shopping? N  Preparing Food and eating ? N  Using the Toilet? N  In the past six months, have you accidently leaked urine? Y  Comment Occasionally when coughing,wears protection.  Do you have problems with loss of bowel control? N  Managing your Medications? N  Managing your Finances? N  Housekeeping or managing your Housekeeping?  N  Some recent data might be hidden    Patient Care Team: Jerrol Banana., MD as PCP - General (Family Medicine) Rockey Situ, Kathlene November, MD as Consulting Physician (Cardiology) Elease Etienne, MD as Consulting Physician (Internal Medicine)    Assessment:   This is a routine wellness examination for [place].  Exercise Activities and Dietary recommendations Current Exercise Habits: Home exercise routine, Type of exercise: walking, Time (Minutes): 20, Frequency (Times/Week): 5, Weekly Exercise (Minutes/Week): 100, Intensity: Mild, Exercise limited by: orthopedic condition(s)  Goals    . Weight (lb) < 200 lb (90.7 kg)     Starting 01/28/16, I will focus on losing 10-15 lbs by the end of February 2018.     . Weight (lb) < 215 lb (97.5 kg)     Recommend to continue to walk daily to help aid in weight loss of 15 lbs or more.        Fall Risk Fall Risk  03/14/2017 01/28/2016 08/20/2014 07/08/2014  Falls in the past year? No No No No   Is the patient's home free of loose throw rugs in walkways, pet beds, electrical cords, etc?   yes      Grab bars in the bathroom? yes      Handrails on the stairs?   yes      Adequate lighting?   yes  Timed Get Up and Go performed:  N/A  Depression Screen PHQ 2/9 Scores 03/14/2017 03/14/2017 01/28/2016 08/20/2014  PHQ - 2 Score 0 0 1 1  PHQ- 9 Score 0 - - -     Cognitive Function     6CIT Screen 03/14/2017 01/28/2016  What Year? 0 points 0 points  What month? 0 points 0 points  What time? 3 points 0 points  Count back from 20 0 points 0 points  Months in reverse 0 points 0 points  Repeat phrase 2 points 2 points  Total Score 5 2    Immunization History  Administered Date(s) Administered  . Pneumococcal Conjugate-13 12/08/2013  . Pneumococcal Polysaccharide-23 11/19/2012    Qualifies for Shingles Vaccine? Pt declines today.   Screening Tests Health Maintenance  Topic Date Due  . OPHTHALMOLOGY EXAM  07/14/2016  . INFLUENZA VACCINE  04/29/2017 (Originally 08/30/2016)  . TETANUS/TDAP  01/27/2026 (Originally 05/24/1952)  . HEMOGLOBIN A1C  03/29/2017  . FOOT EXAM  09/19/2017  . DEXA SCAN  Completed  . PNA vac Low Risk Adult  Completed    Cancer Screenings: Lung: Low Dose CT Chest recommended if Age 61-80 years, 30 pack-year currently smoking OR have quit w/in 15years. Patient does not qualify. Breast:  Up to date on Mammogram? Yes   Up to date of Bone Density/Dexa? Yes Colorectal: Up to date  Additional Screenings:  Hepatitis B/HIV/Syphillis: Pt declines today.  Hepatitis C Screening: Pt declines today.      Plan:  I have personally reviewed and addressed the Medicare Annual Wellness questionnaire and have noted the following in the patient's chart:  A. Medical and social history B. Use of alcohol, tobacco or illicit drugs  C. Current medications and supplements D. Functional ability and status E.  Nutritional status F.  Physical activity G. Advance directives H. List of other physicians I.  Hospitalizations, surgeries, and ER visits in previous 12 months J.  [place] such as hearing and vision if needed, cognitive and depression L. Referrals and appointments - none  In addition,  I have reviewed and discussed with patient certain preventive protocols,  quality metrics, and best practice recommendations. A written personalized care plan for preventive services as well as general preventive health recommendations were provided to patient.  See attached scanned questionnaire for additional information.   Signed,  Fabio Neighbors, LPN Nurse Health Advisor   Nurse Recommendations: None. Pt declined the influenza and tetanus vaccines today.

## 2017-03-14 NOTE — Progress Notes (Signed)
Tracy Mann  MRN: [id number] DOB: 1933/03/13  Subjective:  HPI   The patient is an 84 year old female who presents for follow up of chronic illnesses.  She was last seen on 02/21/17 and 01/31/17 by Dr Caryn Section for acute visits.  She was last seen for chronic issues on 01/03/17.    Patient was seen by the nurse health advisor earlier today.  Patient needs to have her chronic pain medication refilled.  Patient is followed by Dr. Eddie Dibbles for her cholesterol, thyroid, and diabetes.  Atrial Fibrillation Lab Results  Component Value Date   INR 2.7 02/21/2017   INR 3.1 01/31/2017   INR 2.3 01/03/2017      Patient Active Problem List   Diagnosis Date Noted  . Angina pectoris ([place]) 06/07/2015  . Angina at rest [facility]) 05/20/2015  . Weakness 03/03/2015  . Mixed Ischemic/Nonischemic Cardiomyopathy   . Type II diabetes mellitus ([place])   . Chronic atrial fibrillation ([place])   . Chronic combined systolic and diastolic CHF (congestive heart failure) ([place])   . Chronic anticoagulation   . Absolute anemia 07/08/2014  . Airway hyperreactivity 07/08/2014  . Cervical muscle strain 07/08/2014  . PNA (pneumonia) 07/08/2014  . Chest pressure 07/08/2014  . Clinical depression 07/08/2014  . Degeneration of lumbar or lumbosacral intervertebral disc 07/08/2014  . Accumulation of fluid in tissues 07/08/2014  . Anxiety, generalized 07/08/2014  . Acid reflux 07/08/2014  . Folliculitis 25/95/6387  . Adult hypothyroidism 07/08/2014  . Broken leg 07/08/2014  . Cannot sleep 07/08/2014  . Obstructive sleep apnea 07/08/2014  . Arthritis, degenerative 07/08/2014  . Onychia of finger 07/08/2014  . Psoriasis 07/08/2014  . Restless legs syndrome 07/08/2014  . Adult BMI 30+ 07/08/2014  . Calculus of kidney 10/31/2011  . Bladder infection, chronic 10/31/2011  . Incomplete bladder emptying 10/31/2011  . Obesity 11/02/2010  . SHORTNESS OF BREATH 02/01/2010  . Hyperlipidemia 09/16/2008  . CAD, NATIVE VESSEL  09/16/2008  . Atrial fibrillation ([place]) 09/16/2008    Past Medical History:  Diagnosis Date  . Acute myocardial infarction of inferior wall ([place])    a. 10/2005 - Presumed to be 2/2 to a coronary embolus in setting of persistent Afib-->Cath relatively nl cors, EF 45-50% w/ severe distal inferior/apical inferior HK w/ aneursymal appearance.  . Chronic anticoagulation    a. warfarin.  . Chronic atrial fibrillation ([facility])    a. CHA2DS2VASc = 6--> warfarin.  . Chronic combined systolic and diastolic CHF (congestive heart failure) ([place])    a. 10/2012 Echo: EF 35-40%, diff HK, mild MR, mild biatrial enlargement;  b. 11/2012 EF 45-50% by LV gram; c. echo 07/2014: EF 40-45%, mod conc LVH, mod MR, severe biatrial enlargement, PASP 45 mm Hg  . Essential hypertension   . Hyperlipidemia, mixed   . Mixed Ischemic/Nonischemic Cardiomyopathy    a. 10/2012 Echo: EF 35-40%;  b. 11/2012 EF 45-50% by LV gram.  . Obesity   . Thyroid disease    hypothyroidism  . Type II diabetes mellitus ([place])   . Vitamin D deficiency     Social History   Socioeconomic History  . Marital status: Widowed    Spouse name: widowed  . Number of children: 4  . Years of education: 52  . Highest education level: 12th grade  Social Needs  . Financial resource strain: Not hard at all  . Food insecurity - worry: Never true  . Food insecurity - inability: Never true  . Transportation needs - medical: No  .  Transportation needs - non-medical: No  Occupational History  . Occupation: retired  Tobacco Use  . Smoking status: Never Smoker  . Smokeless tobacco: Never Used  Substance and Sexual Activity  . Alcohol use: No  . Drug use: No  . Sexual activity: No  Other Topics Concern  . Not on file  Social History Narrative   Widowed   Has children and grandchildren   Lives in [place]    Outpatient Encounter Medications as of 03/14/2017  Medication Sig  . ACCU-CHEK AVIVA PLUS test strip   . COMBIVENT RESPIMAT 20-100  MCG/ACT AERS respimat 1 puff every 6 (six) hours as needed.   . ezetimibe (ZETIA) 10 MG tablet Take 1 tablet (10 mg total) by mouth daily.  . fluticasone (FLONASE) 50 MCG/ACT nasal spray SPRAY TWO SPRAYS IN EACH NOSTRIL ONCE DAILY  . furosemide (LASIX) 20 MG tablet Take 1 tablet (20 mg total) by mouth 2 (two) times daily as needed.  Marland Kitchen HYDROcodone-acetaminophen (NORCO) 10-325 MG tablet Take 1 tablet by mouth every 6 (six) hours as needed.  . insulin lispro (HUMALOG) 100 UNIT/ML KiwkPen Inject up to 10-20 units three times daily before each meal as directed  . levothyroxine (SYNTHROID, LEVOTHROID) 125 MCG tablet Take 125 mcg by mouth daily before breakfast.  . lisinopril (PRINIVIL,ZESTRIL) 2.5 MG tablet TAKE 1 TABLET BY MOUTH AT BEDTIME  . LORazepam (ATIVAN) 1 MG tablet TAKE 1 TABLET BY MOUTH TWICE A DAY FOR ANXIETY  . Magnesium 500 MG CAPS Take 500 mg by mouth daily.   . metoprolol succinate (TOPROL-XL) 25 MG 24 hr tablet Take 12.5 mg by mouth 2 (two) times daily.  . nitrofurantoin, macrocrystal-monohydrate, (MACROBID) 100 MG capsule TAKE 1 CAPSULE (100 MG TOTAL) BY MOUTH DAILY. (Patient taking differently: Take 100 mg by mouth daily. )  . nitroGLYCERIN (NITROSTAT) 0.4 MG SL tablet PACE 1 TABLET UNDER TONGUE EVERY 5 MINUTES AS NEEDED MAY REPEAT FOR UP TO 3 DOSES  . rOPINIRole (REQUIP) 0.25 MG tablet TAKE 1 TABLET BY MOUTH EVERYDAY AT BEDTIME (Patient taking differently: TAKE 1 TABLET BY MOUTH EVERYDAY AT BEDTIME as needed)  . vitamin B-12 (CYANOCOBALAMIN) 1000 MCG tablet Take 1,000 mcg by mouth daily.  . Vitamin D, Ergocalciferol, (DRISDOL) 50000 units CAPS capsule Take 50,000 Units by mouth.   . warfarin (COUMADIN) 2 MG tablet Take 1 tablet (2 mg total) by mouth daily.  Marland Kitchen warfarin (COUMADIN) 3 MG tablet TAKE 1 TABLET (3 MG TOTAL) BY MOUTH DAILY.  Marland Kitchen warfarin (COUMADIN) 4 MG tablet Take 1 tablet (4 mg total) by mouth daily.   No facility-administered encounter medications on file as of 03/14/2017.       Allergies  Allergen Reactions  . Other Anaphylaxis and Swelling    'tongue swelling'  . Sulfa Antibiotics Anaphylaxis  . Clarithromycin Other (See Comments)    Hives, headaches, hard time swelling, felt like throat was closing up Hives, headaches, hard time swelling, felt like throat was closing up  . Diphenhydramine Other (See Comments)  . Estrogens Conjugated   . Estrogens, Conjugated Other (See Comments)  . Sulfonamide Derivatives Swelling    'tongue swelling'  . Nitrofurantoin Nausea Only    Review of Systems  Constitutional: Negative for fever and malaise/fatigue.  HENT: Negative.   Eyes: Negative.   Respiratory: Negative for cough, shortness of breath and wheezing.   Cardiovascular: Negative for chest pain, palpitations, orthopnea, claudication and leg swelling.  Gastrointestinal: Negative.   Musculoskeletal: Positive for joint pain and myalgias.  Skin:  Negative.   Neurological: Negative.  Negative for weakness.  Endo/Heme/Allergies: Negative.   Psychiatric/Behavioral: Negative.     Objective:  BP 138/70   Pulse 72   Temp 98.2 F (36.8 C) (Oral)   Resp 16   Wt 231 lb (104.8 kg)   BMI 38.44 kg/m   Physical Exam  Constitutional: She is oriented to person, place, and time and well-developed, well-nourished, and in no distress.  HENT:  Head: Normocephalic and atraumatic.  Eyes: Conjunctivae are normal.  Neck: No thyromegaly present.  Cardiovascular: Normal rate, regular rhythm and normal heart sounds.  Pulmonary/Chest: Effort normal and breath sounds normal.  Abdominal: Soft.  Neurological: She is alert and oriented to person, place, and time.  Skin: Skin is warm and dry.  Very fair skin.  Psychiatric: Mood, memory, affect and judgment normal.    Assessment and Plan :  1. Chronic atrial fibrillation ([place])   2. Primary osteoarthritis involving multiple joints Pt advised to try to cut back on Norco. - HYDROcodone-acetaminophen (NORCO) 10-325 MG  tablet; Take 1 tablet by mouth every 6 (six) hours as needed.  Dispense: 120 tablet; Refill: 0 - HYDROcodone-acetaminophen (NORCO) 10-325 MG tablet; Take 1 tablet by mouth every 6 (six) hours as needed.  Dispense: 120 tablet; Refill: 0 - HYDROcodone-acetaminophen (NORCO) 10-325 MG tablet; Take 1 tablet by mouth every 6 (six) hours as needed.  Dispense: 120 tablet; Refill: 0 3.HTN 4.HLD  I have done the exam and reviewed the chart and it is accurate to the best of my knowledge. Development worker, community has been used and  any errors in dictation or transcription are unintentional. Miguel Aschoff M.D. [facility]

## 2017-03-14 NOTE — Patient Instructions (Signed)
Tracy Mann , Thank you for taking time to come for your Medicare Wellness Visit. I appreciate your ongoing commitment to your health goals. Please review the following plan we discussed and let me know if I can assist you in the future.   Screening recommendations/referrals: Colonoscopy: Up to date Mammogram: Up to date Bone Density: Up to date Recommended yearly ophthalmology/optometry visit for glaucoma screening and checkup Recommended yearly dental visit for hygiene and checkup  Vaccinations: Influenza vaccine: Pt declines today.   Pneumococcal vaccine: Up to date Tdap vaccine: Pt declines today.  Shingles vaccine: Pt declines today.     Advanced directives: Advance directive discussed with you today. Even though you declined this today please call our office should you change your mind and we can give you the proper paperwork for you to fill out.  Conditions/risks identified: Obesity- recommend to continue to walk daily to help aid in weight loss of 15 lbs or more.    Next appointment: 3:00 PM today   Preventive Care 65 Years and Older, Female Preventive care refers to lifestyle choices and visits with your health care provider that can promote health and wellness. What does preventive care include?  A yearly physical exam. This is also called an annual well check.  Dental exams once or twice a year.  Routine eye exams. Ask your health care provider how often you should have your eyes checked.  Personal lifestyle choices, including:  Daily care of your teeth and gums.  Regular physical activity.  Eating a healthy diet.  Avoiding tobacco and drug use.  Limiting alcohol use.  Practicing safe sex.  Taking low-dose aspirin every day.  Taking vitamin and mineral supplements as recommended by your health care provider. What happens during an annual well check? The services and screenings done by your health care provider during your annual well check will depend on  your age, overall health, lifestyle risk factors, and family history of disease. Counseling  Your health care provider may ask you questions about your:  Alcohol use.  Tobacco use.  Drug use.  Emotional well-being.  Home and relationship well-being.  Sexual activity.  Eating habits.  History of falls.  Memory and ability to understand (cognition).  Work and work Statistician.  Reproductive health. Screening  You may have the following tests or measurements:  Height, weight, and BMI.  Blood pressure.  Lipid and cholesterol levels. These may be checked every 5 years, or more frequently if you are over 42 years old.  Skin check.  Lung cancer screening. You may have this screening every year starting at age 74 if you have a 30-pack-year history of smoking and currently smoke or have quit within the past 15 years.  Fecal occult blood test (FOBT) of the stool. You may have this test every year starting at age 54.  Flexible sigmoidoscopy or colonoscopy. You may have a sigmoidoscopy every 5 years or a colonoscopy every 10 years starting at age 66.  Hepatitis C blood test.  Hepatitis B blood test.  Sexually transmitted disease (STD) testing.  Diabetes screening. This is done by checking your blood sugar (glucose) after you have not eaten for a while (fasting). You may have this done every 1-3 years.  Bone density scan. This is done to screen for osteoporosis. You may have this done starting at age 91.  Mammogram. This may be done every 1-2 years. Talk to your health care provider about how often you should have regular mammograms. Talk with your  health care provider about your test results, treatment options, and if necessary, the need for more tests. Vaccines  Your health care provider may recommend certain vaccines, such as:  Influenza vaccine. This is recommended every year.  Tetanus, diphtheria, and acellular pertussis (Tdap, Td) vaccine. You may need a Td booster  every 10 years.  Zoster vaccine. You may need this after age 73.  Pneumococcal 13-valent conjugate (PCV13) vaccine. One dose is recommended after age 30.  Pneumococcal polysaccharide (PPSV23) vaccine. One dose is recommended after age 63. Talk to your health care provider about which screenings and vaccines you need and how often you need them. This information is not intended to replace advice given to you by your health care provider. Make sure you discuss any questions you have with your health care provider. Document Released: 02/12/2015 Document Revised: 10/06/2015 Document Reviewed: 11/17/2014 Elsevier Interactive Patient Education  2017 [employer] Prevention in the Home Falls can cause injuries. They can happen to people of all ages. There are many things you can do to make your home safe and to help prevent falls. What can I do on the outside of my home?  Regularly fix the edges of walkways and driveways and fix any cracks.  Remove anything that might make you trip as you walk through a door, such as a raised step or threshold.  Trim any bushes or trees on the path to your home.  Use bright outdoor lighting.  Clear any walking paths of anything that might make someone trip, such as rocks or tools.  Regularly check to see if handrails are loose or broken. Make sure that both sides of any steps have handrails.  Any raised decks and porches should have guardrails on the edges.  Have any leaves, snow, or ice cleared regularly.  Use sand or salt on walking paths during winter.  Clean up any spills in your garage right away. This includes oil or grease spills. What can I do in the bathroom?  Use night lights.  Install grab bars by the toilet and in the tub and shower. Do not use towel bars as grab bars.  Use non-skid mats or decals in the tub or shower.  If you need to sit down in the shower, use a plastic, non-slip stool.  Keep the floor dry. Clean up any  water that spills on the floor as soon as it happens.  Remove soap buildup in the tub or shower regularly.  Attach bath mats securely with double-sided non-slip rug tape.  Do not have throw rugs and other things on the floor that can make you trip. What can I do in the bedroom?  Use night lights.  Make sure that you have a light by your bed that is easy to reach.  Do not use any sheets or blankets that are too big for your bed. They should not hang down onto the floor.  Have a firm chair that has side arms. You can use this for support while you get dressed.  Do not have throw rugs and other things on the floor that can make you trip. What can I do in the kitchen?  Clean up any spills right away.  Avoid walking on wet floors.  Keep items that you use a lot in easy-to-reach places.  If you need to reach something above you, use a strong step stool that has a grab bar.  Keep electrical cords out of the way.  Do not use  floor polish or wax that makes floors slippery. If you must use wax, use non-skid floor wax.  Do not have throw rugs and other things on the floor that can make you trip. What can I do with my stairs?  Do not leave any items on the stairs.  Make sure that there are handrails on both sides of the stairs and use them. Fix handrails that are broken or loose. Make sure that handrails are as long as the stairways.  Check any carpeting to make sure that it is firmly attached to the stairs. Fix any carpet that is loose or worn.  Avoid having throw rugs at the top or bottom of the stairs. If you do have throw rugs, attach them to the floor with carpet tape.  Make sure that you have a light switch at the top of the stairs and the bottom of the stairs. If you do not have them, ask someone to add them for you. What else can I do to help prevent falls?  Wear shoes that:  Do not have high heels.  Have rubber bottoms.  Are comfortable and fit you well.  Are closed  at the toe. Do not wear sandals.  If you use a stepladder:  Make sure that it is fully opened. Do not climb a closed stepladder.  Make sure that both sides of the stepladder are locked into place.  Ask someone to hold it for you, if possible.  Clearly mark and make sure that you can see:  Any grab bars or handrails.  First and last steps.  Where the edge of each step is.  Use tools that help you move around (mobility aids) if they are needed. These include:  Canes.  Walkers.  Scooters.  Crutches.  Turn on the lights when you go into a dark area. Replace any light bulbs as soon as they burn out.  Set up your furniture so you have a clear path. Avoid moving your furniture around.  If any of your floors are uneven, fix them.  If there are any pets around you, be aware of where they are.  Review your medicines with your doctor. Some medicines can make you feel dizzy. This can increase your chance of falling. Ask your doctor what other things that you can do to help prevent falls. This information is not intended to replace advice given to you by your health care provider. Make sure you discuss any questions you have with your health care provider. Document Released: 11/12/2008 Document Revised: 06/24/2015 Document Reviewed: 02/20/2014 Elsevier Interactive Patient Education  2017 [employer].

## 2017-03-21 ENCOUNTER — Ambulatory Visit (INDEPENDENT_AMBULATORY_CARE_PROVIDER_SITE_OTHER): Payer: Medicare Other

## 2017-03-21 DIAGNOSIS — I482 Chronic atrial fibrillation, unspecified: Secondary | ICD-10-CM

## 2017-03-21 LAB — POCT INR
INR: 2.8
PT: 34.1

## 2017-03-21 NOTE — Progress Notes (Signed)
Description   Dx: Atrial Fibrillation  Current coumadin dose: 3mg  daily PT: 34.1 INR: 2.8 Today's Changes: NO CHANGE Recheck: 4 weeks

## 2017-03-23 ENCOUNTER — Other Ambulatory Visit: Payer: Self-pay | Admitting: Family Medicine

## 2017-04-03 ENCOUNTER — Ambulatory Visit: Payer: Self-pay | Admitting: Family Medicine

## 2017-04-03 DIAGNOSIS — E559 Vitamin D deficiency, unspecified: Secondary | ICD-10-CM | POA: Diagnosis not present

## 2017-04-03 DIAGNOSIS — E039 Hypothyroidism, unspecified: Secondary | ICD-10-CM | POA: Diagnosis not present

## 2017-04-03 DIAGNOSIS — E1165 Type 2 diabetes mellitus with hyperglycemia: Secondary | ICD-10-CM | POA: Diagnosis not present

## 2017-04-03 DIAGNOSIS — E781 Pure hyperglyceridemia: Secondary | ICD-10-CM | POA: Diagnosis not present

## 2017-04-18 ENCOUNTER — Ambulatory Visit (INDEPENDENT_AMBULATORY_CARE_PROVIDER_SITE_OTHER): Payer: Medicare Other

## 2017-04-18 ENCOUNTER — Other Ambulatory Visit: Payer: Self-pay | Admitting: Cardiovascular Disease

## 2017-04-18 DIAGNOSIS — I482 Chronic atrial fibrillation, unspecified: Secondary | ICD-10-CM

## 2017-04-18 LAB — POCT INR
ESTIMATED AVERAGE GLUCOSE: 30.9
INR: 2.6

## 2017-04-18 NOTE — Patient Instructions (Signed)
Description   Dx: Atrial Fibrillation  Current coumadin dose: 3mg  daily PT: 30.9 INR: 2.6 Today's Changes: NO CHANGE Recheck: 4 weeks

## 2017-05-16 ENCOUNTER — Ambulatory Visit: Payer: Medicare Other

## 2017-05-16 DIAGNOSIS — I482 Chronic atrial fibrillation, unspecified: Secondary | ICD-10-CM

## 2017-05-16 LAB — POCT INR
INR: 2.5
PT: 30.6

## 2017-05-16 NOTE — Patient Instructions (Signed)
Description   Dx: Atrial Fibrillation  Current coumadin dose: 3mg  daily PT: 30.6 INR: 2.5 Today's Changes: NO CHANGE Recheck: 4 weeks

## 2017-05-17 DIAGNOSIS — E039 Hypothyroidism, unspecified: Secondary | ICD-10-CM | POA: Diagnosis not present

## 2017-05-17 DIAGNOSIS — E559 Vitamin D deficiency, unspecified: Secondary | ICD-10-CM | POA: Diagnosis not present

## 2017-05-17 DIAGNOSIS — Z794 Long term (current) use of insulin: Secondary | ICD-10-CM | POA: Diagnosis not present

## 2017-05-17 DIAGNOSIS — E1165 Type 2 diabetes mellitus with hyperglycemia: Secondary | ICD-10-CM | POA: Diagnosis not present

## 2017-05-25 DIAGNOSIS — E1165 Type 2 diabetes mellitus with hyperglycemia: Secondary | ICD-10-CM | POA: Diagnosis not present

## 2017-05-25 DIAGNOSIS — E559 Vitamin D deficiency, unspecified: Secondary | ICD-10-CM | POA: Diagnosis not present

## 2017-05-25 DIAGNOSIS — E039 Hypothyroidism, unspecified: Secondary | ICD-10-CM | POA: Diagnosis not present

## 2017-05-25 DIAGNOSIS — E781 Pure hyperglyceridemia: Secondary | ICD-10-CM | POA: Diagnosis not present

## 2017-05-28 ENCOUNTER — Other Ambulatory Visit: Payer: Self-pay | Admitting: Family Medicine

## 2017-05-28 NOTE — Telephone Encounter (Signed)
[facility] pharmacy faxed a refill request for the following medication. Thanks CC  LORazepam (ATIVAN) 1 MG tablet

## 2017-05-29 MED ORDER — LORAZEPAM 1 MG PO TABS: ORAL_TABLET | ORAL | 2 refills | Status: DC

## 2017-06-10 ENCOUNTER — Other Ambulatory Visit: Payer: Self-pay | Admitting: Family Medicine

## 2017-06-13 ENCOUNTER — Ambulatory Visit: Payer: Medicare Other

## 2017-06-15 ENCOUNTER — Encounter: Payer: Self-pay | Admitting: Family Medicine

## 2017-06-15 ENCOUNTER — Ambulatory Visit: Payer: Medicare Other

## 2017-06-15 ENCOUNTER — Ambulatory Visit (INDEPENDENT_AMBULATORY_CARE_PROVIDER_SITE_OTHER): Payer: Medicare Other | Admitting: Family Medicine

## 2017-06-15 VITALS — BP 132/80 | HR 84 | Temp 98.1°F | Resp 18 | Wt 234.0 lb

## 2017-06-15 DIAGNOSIS — I482 Chronic atrial fibrillation, unspecified: Secondary | ICD-10-CM

## 2017-06-15 DIAGNOSIS — R079 Chest pain, unspecified: Secondary | ICD-10-CM

## 2017-06-15 LAB — POCT INR
INR: 3.2
INR: 3.2
Prothrombin Time: 37.8

## 2017-06-15 MED ORDER — FAMOTIDINE 40 MG PO TABS: 40.0000 mg | ORAL_TABLET | Freq: Every day | ORAL | 1 refills | Status: DC

## 2017-06-15 NOTE — Progress Notes (Signed)
  Subjective:     Patient ID: Tracy Mann, female   DOB: 1933-11-21, 84 y.o.   MRN: [id number] Chief Complaint  Patient presents with  . Chest Pain    Paitent came in office for nurse visit and complains of sudden chest pain that she describes as sharp. Patient states that pain is shooting up to right side of face. Patient has taken 2 nitrostats since being in office.   HPI Reports moderate relief with NTG. No associated s.o.b.or diaphoresis. Reports she last used NTG 4 weeks ago. Also has similar presentation while in her cardiologists waiting room which was felt to be G.I. Triggered. Hx of CAD, chronic A-Fib and angina.  Review of Systems     Objective:   Physical Exam  Constitutional: She appears well-nourished. No distress.  Cardiovascular:  Regular to irregularly irregular rhythm  Pulmonary/Chest: Breath sounds normal.  Abdominal: Soft. There is tenderness (moderate in epigastric area). There is no guarding.  Musculoskeletal:       Right lower leg: Normal. She exhibits no edema.       Left lower leg: Normal. She exhibits no edema.       Assessment:    1. Chest pain, unspecified type: will cover for gastritis/heartburn - EKG 12-Lead: unchanged from prior tracing - famotidine (PEPCID) 40 MG tablet; Take 1 tablet (40 mg total) by mouth daily.  Dispense: 30 tablet; Refill: 1  2. Chronic atrial fibrillation ([place]) - POCT INR     Plan:    Update appointment with Dr.[name] if further chest pain. Start acid blocker

## 2017-06-15 NOTE — Patient Instructions (Addendum)
Start acid blocker today that I sent in to your pharmacy. If recurrent chest pain update your appointment with Dr. Rockey Situ.  Description   Dx: Atrial Fibrillation  Hold for 2 days than resume 3mg  qd  Recheck 3 weeks

## 2017-06-19 ENCOUNTER — Encounter: Payer: Self-pay | Admitting: Family Medicine

## 2017-06-19 ENCOUNTER — Ambulatory Visit: Payer: Medicare Other | Admitting: Family Medicine

## 2017-06-19 VITALS — BP 122/64 | HR 72 | Temp 99.0°F | Resp 16 | Wt 231.0 lb

## 2017-06-19 DIAGNOSIS — Z794 Long term (current) use of insulin: Secondary | ICD-10-CM

## 2017-06-19 DIAGNOSIS — I482 Chronic atrial fibrillation, unspecified: Secondary | ICD-10-CM

## 2017-06-19 DIAGNOSIS — M159 Polyosteoarthritis, unspecified: Secondary | ICD-10-CM

## 2017-06-19 DIAGNOSIS — E119 Type 2 diabetes mellitus without complications: Secondary | ICD-10-CM | POA: Diagnosis not present

## 2017-06-19 DIAGNOSIS — J069 Acute upper respiratory infection, unspecified: Secondary | ICD-10-CM | POA: Diagnosis not present

## 2017-06-19 DIAGNOSIS — M150 Primary generalized (osteo)arthritis: Secondary | ICD-10-CM

## 2017-06-19 DIAGNOSIS — R059 Cough, unspecified: Secondary | ICD-10-CM

## 2017-06-19 DIAGNOSIS — G894 Chronic pain syndrome: Secondary | ICD-10-CM | POA: Diagnosis not present

## 2017-06-19 DIAGNOSIS — R05 Cough: Secondary | ICD-10-CM

## 2017-06-19 DIAGNOSIS — I119 Hypertensive heart disease without heart failure: Secondary | ICD-10-CM

## 2017-06-19 MED ORDER — HYDROCODONE-ACETAMINOPHEN 10-325 MG PO TABS: 1.0000 | ORAL_TABLET | Freq: Four times a day (QID) | ORAL | 0 refills | Status: DC | PRN

## 2017-06-19 NOTE — Patient Instructions (Signed)
Robitussin DM for cough

## 2017-06-19 NOTE — Progress Notes (Signed)
Patient: Tracy Mann Female    DOB: 12-Oct-1933   84 y.o.   MRN: [id number] Visit Date: 06/19/2017  Today's Provider: Wilhemena Durie, MD   Chief Complaint  Patient presents with  . Follow-up  . URI   Subjective:    HPI Pt is here for a 3 month follow up for her chronic pain. She needs refills on her Hydrocodone. She says she cannot do without the narcotics. She reports that for the past 2 days she has been coughing, had sinus congestion and feeling bad. She has been taking OTC decongestants for this and seems to be feeling better today. She is coughing up green mucus.  All other issues stable.     Allergies  Allergen Reactions  . Other Anaphylaxis and Swelling    'tongue swelling'  . Sulfa Antibiotics Anaphylaxis  . Clarithromycin Other (See Comments)    Hives, headaches, hard time swelling, felt like throat was closing up Hives, headaches, hard time swelling, felt like throat was closing up  . Diphenhydramine Other (See Comments)  . Estrogens Conjugated   . Estrogens, Conjugated Other (See Comments)  . Sulfonamide Derivatives Swelling    'tongue swelling'  . Nitrofurantoin Nausea Only     Current Outpatient Medications:  .  ACCU-CHEK AVIVA PLUS test strip, , Disp: , Rfl:  .  Coenzyme Q10 (CO Q 10 PO), Take by mouth., Disp: , Rfl:  .  COMBIVENT RESPIMAT 20-100 MCG/ACT AERS respimat, 1 puff every 6 (six) hours as needed. , Disp: , Rfl:  .  ezetimibe (ZETIA) 10 MG tablet, TAKE 1 TABLET BY MOUTH EVERY DAY, Disp: 90 tablet, Rfl: 3 .  famotidine (PEPCID) 40 MG tablet, Take 1 tablet (40 mg total) by mouth daily., Disp: 30 tablet, Rfl: 1 .  fluticasone (FLONASE) 50 MCG/ACT nasal spray, SPRAY TWO SPRAYS IN EACH NOSTRIL ONCE DAILY, Disp: 16 g, Rfl: 11 .  furosemide (LASIX) 20 MG tablet, Take 1 tablet (20 mg total) by mouth 2 (two) times daily as needed., Disp: 60 tablet, Rfl: 11 .  HYDROcodone-acetaminophen (NORCO) 10-325 MG tablet, Take 1 tablet by mouth every 6  (six) hours as needed., Disp: 120 tablet, Rfl: 0 .  HYDROcodone-acetaminophen (NORCO) 10-325 MG tablet, Take 1 tablet by mouth every 6 (six) hours as needed., Disp: 120 tablet, Rfl: 0 .  HYDROcodone-acetaminophen (NORCO) 10-325 MG tablet, Take 1 tablet by mouth every 6 (six) hours as needed., Disp: 120 tablet, Rfl: 0 .  insulin lispro (HUMALOG) 100 UNIT/ML KiwkPen, Inject up to 10-20 units three times daily before each meal as directed, Disp: , Rfl:  .  levothyroxine (SYNTHROID, LEVOTHROID) 125 MCG tablet, Take 125 mcg by mouth daily before breakfast., Disp: , Rfl:  .  lisinopril (PRINIVIL,ZESTRIL) 2.5 MG tablet, TAKE 1 TABLET BY MOUTH AT BEDTIME, Disp: 30 tablet, Rfl: 11 .  LORazepam (ATIVAN) 1 MG tablet, TAKE 1 TABLET BY MOUTH TWICE A DAY FOR ANXIETY, Disp: 60 tablet, Rfl: 2 .  Magnesium 500 MG CAPS, Take 500 mg by mouth daily. , Disp: , Rfl:  .  metoprolol succinate (TOPROL-XL) 25 MG 24 hr tablet, Take 12.5 mg by mouth 2 (two) times daily., Disp: , Rfl:  .  metoprolol succinate (TOPROL-XL) 25 MG 24 hr tablet, TAKE 1 TABLET (25 MG TOTAL) BY MOUTH 2 (TWO) TIMES DAILY., Disp: 180 tablet, Rfl: 2 .  nitroGLYCERIN (NITROSTAT) 0.4 MG SL tablet, PACE 1 TABLET UNDER TONGUE EVERY 5 MINUTES AS NEEDED MAY REPEAT FOR  UP TO 3 DOSES, Disp: 25 tablet, Rfl: 0 .  rOPINIRole (REQUIP) 0.25 MG tablet, TAKE 1 TABLET BY MOUTH EVERYDAY AT BEDTIME (Patient taking differently: TAKE 1 TABLET BY MOUTH EVERYDAY AT BEDTIME as needed), Disp: 30 tablet, Rfl: 5 .  vitamin B-12 (CYANOCOBALAMIN) 1000 MCG tablet, Take 1,000 mcg by mouth daily., Disp: , Rfl:  .  Vitamin D, Ergocalciferol, (DRISDOL) 50000 units CAPS capsule, Take 50,000 Units by mouth. , Disp: , Rfl:  .  warfarin (COUMADIN) 2 MG tablet, Take 1 tablet (2 mg total) by mouth daily., Disp: 30 tablet, Rfl: 3 .  warfarin (COUMADIN) 3 MG tablet, TAKE 1 TABLET (3 MG TOTAL) BY MOUTH DAILY., Disp: 30 tablet, Rfl: 12 .  warfarin (COUMADIN) 4 MG tablet, Take 1 tablet (4 mg total)  by mouth daily., Disp: 30 tablet, Rfl: 12 .  nitrofurantoin, macrocrystal-monohydrate, (MACROBID) 100 MG capsule, TAKE 1 CAPSULE (100 MG TOTAL) BY MOUTH DAILY. (Patient not taking: Reported on 06/15/2017), Disp: 30 capsule, Rfl: 10  Review of Systems  Constitutional: Positive for fatigue.  HENT: Positive for congestion, rhinorrhea, sinus pressure, sinus pain and voice change.   Eyes: Positive for discharge.  Respiratory: Positive for cough.   Cardiovascular: Negative.   Gastrointestinal: Negative.   Endocrine: Negative.   Genitourinary: Negative.   Musculoskeletal: Negative.   Skin: Negative.   Allergic/Immunologic: Negative.   Neurological: Positive for headaches.  Hematological: Negative.   Psychiatric/Behavioral: Negative.     Social History   Tobacco Use  . Smoking status: Never Smoker  . Smokeless tobacco: Never Used  Substance Use Topics  . Alcohol use: No   Objective:   BP 122/64 (BP Location: Left Arm, Patient Position: Sitting, Cuff Size: Large)   Pulse 72   Temp 99 F (37.2 C) (Oral)   Resp 16   Wt 231 lb (104.8 kg)   SpO2 94%   BMI 38.44 kg/m  Vitals:   06/19/17 1440  BP: 122/64  Pulse: 72  Resp: 16  Temp: 99 F (37.2 C)  TempSrc: Oral  SpO2: 94%  Weight: 231 lb (104.8 kg)     Physical Exam  Constitutional: She is oriented to person, place, and time. She appears well-developed and well-nourished.  HENT:  Head: Normocephalic and atraumatic.  Right Ear: External ear normal.  Left Ear: External ear normal.  Nose: Nose normal.  Eyes: Conjunctivae are normal. No scleral icterus.  Neck: No thyromegaly present.  Cardiovascular: Normal rate, regular rhythm and normal heart sounds.  Pulmonary/Chest: Effort normal and breath sounds normal.  Abdominal: Soft.  Neurological: She is alert and oriented to person, place, and time.  Skin: Skin is warm and dry.  Psychiatric: She has a normal mood and affect. Her behavior is normal. Judgment and thought content  normal.        Assessment & Plan:     1. Type 2 diabetes mellitus without complication, with long-term current use of insulin (HCC)  - Lipid panel - Comprehensive metabolic panel - Hemoglobin A1c  2. HYPERTENSION, HEART CONTROLLED W/O ASSOC CHF  - CBC with Differential/Platelet - TSH  3. Chronic pain syndrome Advised pt it would be good to cut back to half pill of norco.  4. Chronic atrial fibrillation ([place])   5. Primary osteoarthritis involving multiple joints  - HYDROcodone-acetaminophen (NORCO) 10-325 MG tablet; Take 1 tablet by mouth every 6 (six) hours as needed.  Dispense: 120 tablet; Refill: 0 - HYDROcodone-acetaminophen (NORCO) 10-325 MG tablet; Take 1 tablet by mouth every 6 (six)  hours as needed.  Dispense: 120 tablet; Refill: 0 - HYDROcodone-acetaminophen (NORCO) 10-325 MG tablet; Take 1 tablet by mouth every 6 (six) hours as needed.  Dispense: 120 tablet; Refill: 0  6. Cough   7. Upper respiratory tract infection, unspecified type iral.      I have done the exam and reviewed the above chart and it is accurate to the best of my knowledge. Development worker, community has been used in this note in any air is in the dictation or transcription are unintentional.  Wilhemena Durie, MD  [place]

## 2017-07-04 ENCOUNTER — Ambulatory Visit (INDEPENDENT_AMBULATORY_CARE_PROVIDER_SITE_OTHER): Payer: Medicare Other | Admitting: Family Medicine

## 2017-07-04 ENCOUNTER — Encounter: Payer: Self-pay | Admitting: Family Medicine

## 2017-07-04 VITALS — BP 122/68 | HR 74 | Temp 98.6°F | Resp 18 | Wt 229.0 lb

## 2017-07-04 DIAGNOSIS — J324 Chronic pansinusitis: Secondary | ICD-10-CM

## 2017-07-04 DIAGNOSIS — J309 Allergic rhinitis, unspecified: Secondary | ICD-10-CM | POA: Diagnosis not present

## 2017-07-04 MED ORDER — FLUTICASONE PROPIONATE 50 MCG/ACT NA SUSP: NASAL | 11 refills | Status: DC

## 2017-07-04 MED ORDER — AMOXICILLIN-POT CLAVULANATE 875-125 MG PO TABS: 1.0000 | ORAL_TABLET | Freq: Three times a day (TID) | ORAL | 1 refills | Status: DC

## 2017-07-04 NOTE — Progress Notes (Signed)
Patient: Tracy Mann Female    DOB: Apr 19, 1933   84 y.o.   MRN: [id number] Visit Date: 07/04/2017  Today's Provider: Wilhemena Durie, MD   Chief Complaint  Patient presents with  . URI   Subjective:    HPI Pt is here today for possible sinus infection. She reports that it started about 3 weeks ago. She was getting better but then started to get worse again. She has sinus congestion, headaches, cough, post nasal drainage, right ear pain worse than left, and throat irritation. She denies fevers, chills. Pt reports that she has not been sleeping well due to this and generally does not feel good at all.       Allergies  Allergen Reactions  . Other Anaphylaxis and Swelling    'tongue swelling'  . Sulfa Antibiotics Anaphylaxis  . Clarithromycin Other (See Comments)    Hives, headaches, hard time swelling, felt like throat was closing up Hives, headaches, hard time swelling, felt like throat was closing up  . Diphenhydramine Other (See Comments)  . Estrogens Conjugated   . Estrogens, Conjugated Other (See Comments)  . Sulfonamide Derivatives Swelling    'tongue swelling'  . Nitrofurantoin Nausea Only     Current Outpatient Medications:  .  ACCU-CHEK AVIVA PLUS test strip, , Disp: , Rfl:  .  Coenzyme Q10 (CO Q 10 PO), Take by mouth., Disp: , Rfl:  .  COMBIVENT RESPIMAT 20-100 MCG/ACT AERS respimat, 1 puff every 6 (six) hours as needed. , Disp: , Rfl:  .  ezetimibe (ZETIA) 10 MG tablet, TAKE 1 TABLET BY MOUTH EVERY DAY, Disp: 90 tablet, Rfl: 3 .  famotidine (PEPCID) 40 MG tablet, Take 1 tablet (40 mg total) by mouth daily., Disp: 30 tablet, Rfl: 1 .  fluticasone (FLONASE) 50 MCG/ACT nasal spray, SPRAY TWO SPRAYS IN EACH NOSTRIL ONCE DAILY, Disp: 16 g, Rfl: 11 .  furosemide (LASIX) 20 MG tablet, Take 1 tablet (20 mg total) by mouth 2 (two) times daily as needed., Disp: 60 tablet, Rfl: 11 .  HYDROcodone-acetaminophen (NORCO) 10-325 MG tablet, Take 1 tablet by mouth every 6  (six) hours as needed., Disp: 120 tablet, Rfl: 0 .  HYDROcodone-acetaminophen (NORCO) 10-325 MG tablet, Take 1 tablet by mouth every 6 (six) hours as needed., Disp: 120 tablet, Rfl: 0 .  HYDROcodone-acetaminophen (NORCO) 10-325 MG tablet, Take 1 tablet by mouth every 6 (six) hours as needed., Disp: 120 tablet, Rfl: 0 .  insulin lispro (HUMALOG) 100 UNIT/ML KiwkPen, Inject up to 10-20 units three times daily before each meal as directed, Disp: , Rfl:  .  levothyroxine (SYNTHROID, LEVOTHROID) 125 MCG tablet, Take 125 mcg by mouth daily before breakfast., Disp: , Rfl:  .  lisinopril (PRINIVIL,ZESTRIL) 2.5 MG tablet, TAKE 1 TABLET BY MOUTH AT BEDTIME, Disp: 30 tablet, Rfl: 11 .  LORazepam (ATIVAN) 1 MG tablet, TAKE 1 TABLET BY MOUTH TWICE A DAY FOR ANXIETY, Disp: 60 tablet, Rfl: 2 .  Magnesium 500 MG CAPS, Take 500 mg by mouth daily. , Disp: , Rfl:  .  metoprolol succinate (TOPROL-XL) 25 MG 24 hr tablet, Take 12.5 mg by mouth 2 (two) times daily., Disp: , Rfl:  .  metoprolol succinate (TOPROL-XL) 25 MG 24 hr tablet, TAKE 1 TABLET (25 MG TOTAL) BY MOUTH 2 (TWO) TIMES DAILY., Disp: 180 tablet, Rfl: 2 .  nitroGLYCERIN (NITROSTAT) 0.4 MG SL tablet, PACE 1 TABLET UNDER TONGUE EVERY 5 MINUTES AS NEEDED MAY REPEAT FOR UP TO  3 DOSES, Disp: 25 tablet, Rfl: 0 .  rOPINIRole (REQUIP) 0.25 MG tablet, TAKE 1 TABLET BY MOUTH EVERYDAY AT BEDTIME (Patient taking differently: TAKE 1 TABLET BY MOUTH EVERYDAY AT BEDTIME as needed), Disp: 30 tablet, Rfl: 5 .  vitamin B-12 (CYANOCOBALAMIN) 1000 MCG tablet, Take 1,000 mcg by mouth daily., Disp: , Rfl:  .  Vitamin D, Ergocalciferol, (DRISDOL) 50000 units CAPS capsule, Take 50,000 Units by mouth. , Disp: , Rfl:  .  warfarin (COUMADIN) 2 MG tablet, Take 1 tablet (2 mg total) by mouth daily., Disp: 30 tablet, Rfl: 3 .  warfarin (COUMADIN) 3 MG tablet, TAKE 1 TABLET (3 MG TOTAL) BY MOUTH DAILY., Disp: 30 tablet, Rfl: 12 .  warfarin (COUMADIN) 4 MG tablet, Take 1 tablet (4 mg total)  by mouth daily., Disp: 30 tablet, Rfl: 12 .  nitrofurantoin, macrocrystal-monohydrate, (MACROBID) 100 MG capsule, TAKE 1 CAPSULE (100 MG TOTAL) BY MOUTH DAILY. (Patient not taking: Reported on 06/15/2017), Disp: 30 capsule, Rfl: 10  Review of Systems  Constitutional: Positive for fatigue.  HENT: Positive for congestion, ear pain, postnasal drip, rhinorrhea, sinus pressure, sinus pain and sore throat.   Eyes: Negative.   Respiratory: Positive for cough.   Cardiovascular: Negative.   Gastrointestinal: Negative.   Endocrine: Negative.   Genitourinary: Negative.   Musculoskeletal: Negative.   Allergic/Immunologic: Negative.   Neurological: Positive for weakness.  Hematological: Negative.   Psychiatric/Behavioral: Negative.     Social History   Tobacco Use  . Smoking status: Never Smoker  . Smokeless tobacco: Never Used  Substance Use Topics  . Alcohol use: No   Objective:   BP 122/68 (BP Location: Left Arm, Patient Position: Sitting, Cuff Size: Large)   Pulse 74   Temp 98.6 F (37 C) (Oral)   Resp 18   Wt 229 lb (103.9 kg)   SpO2 93%   BMI 38.11 kg/m  Vitals:   07/04/17 1511  BP: 122/68  Pulse: 74  Resp: 18  Temp: 98.6 F (37 C)  TempSrc: Oral  SpO2: 93%  Weight: 229 lb (103.9 kg)     Physical Exam  Constitutional: She is oriented to person, place, and time. She appears well-developed and well-nourished.  HENT:  Head: Normocephalic and atraumatic.  Right Ear: External ear normal.  Left Ear: External ear normal.  Nose: Nose normal.  Mouth/Throat: Oropharynx is clear and moist.  Sinuses tender.  Eyes: Conjunctivae are normal. No scleral icterus.  Neck: Neck supple. No thyromegaly present.  Cardiovascular: Normal rate, regular rhythm, normal heart sounds and intact distal pulses.  Pulmonary/Chest: Effort normal and breath sounds normal.  Abdominal: Soft.  Lymphadenopathy:    She has no cervical adenopathy.  Neurological: She is alert and oriented to person,  place, and time.  Skin: Skin is warm and dry.  Psychiatric: She has a normal mood and affect. Her behavior is normal. Judgment and thought content normal.        Assessment & Plan:     1. Pansinusitis, unspecified chronicity  - amoxicillin-clavulanate (AUGMENTIN) 875-125 MG tablet; Take 1 tablet by mouth 3 (three) times daily.  Dispense: 30 tablet; Refill: 1  2. Allergic rhinitis, unspecified seasonality, unspecified trigger  - fluticasone (FLONASE) 50 MCG/ACT nasal spray; SPRAY TWO SPRAYS IN EACH NOSTRIL ONCE DAILY  Dispense: 16 g; Refill: 11 3.Chronic Pain/OA     I have done the exam and reviewed the above chart and it is accurate to the best of my knowledge. Development worker, community has been used in  this note in any air is in the dictation or transcription are unintentional.  Wilhemena Durie, MD  [facility]

## 2017-07-06 ENCOUNTER — Ambulatory Visit: Payer: Medicare Other | Admitting: Emergency Medicine

## 2017-07-06 DIAGNOSIS — I482 Chronic atrial fibrillation, unspecified: Secondary | ICD-10-CM

## 2017-07-06 LAB — POCT INR
INR: 1.7 — AB (ref 2.0–3.0)
PT: 20.8

## 2017-07-06 NOTE — Patient Instructions (Signed)
Description   Dx: Atrial Fibrillation  Decrease consumption of greens and continue 3mg qd  Recheck 2 weeks     

## 2017-07-07 ENCOUNTER — Other Ambulatory Visit: Payer: Self-pay | Admitting: Family Medicine

## 2017-07-07 DIAGNOSIS — R079 Chest pain, unspecified: Secondary | ICD-10-CM

## 2017-07-09 NOTE — Telephone Encounter (Signed)
See refill request.

## 2017-07-20 ENCOUNTER — Ambulatory Visit: Payer: Medicare Other

## 2017-07-20 DIAGNOSIS — I4821 Permanent atrial fibrillation: Secondary | ICD-10-CM

## 2017-07-20 DIAGNOSIS — I482 Chronic atrial fibrillation: Secondary | ICD-10-CM

## 2017-07-20 LAB — POCT INR
INR: 2.1 (ref 2.0–3.0)
PT: 25.7

## 2017-07-20 NOTE — Patient Instructions (Signed)
Description   Dx: Atrial Fibrillation  Decrease consumption of greens and continue 3mg  qd  Recheck 2 weeks

## 2017-07-28 ENCOUNTER — Other Ambulatory Visit: Payer: Self-pay | Admitting: Cardiovascular Disease

## 2017-08-03 ENCOUNTER — Ambulatory Visit: Payer: Medicare Other

## 2017-08-03 DIAGNOSIS — I482 Chronic atrial fibrillation, unspecified: Secondary | ICD-10-CM

## 2017-08-03 LAB — POCT INR
INR: 2.1 (ref 2.0–3.0)
PT: 25.7

## 2017-08-03 NOTE — Patient Instructions (Signed)
Description   Dx: Atrial Fibrillation  Continue 3mg  qd  Recheck 2 weeks

## 2017-08-23 ENCOUNTER — Telehealth: Payer: Self-pay

## 2017-08-23 NOTE — Telephone Encounter (Signed)
Called pt to [facility] and then realized pt had this completed 03/14/17 so pt is up to date with the wellness this year. Error. -MM

## 2017-08-29 ENCOUNTER — Other Ambulatory Visit: Payer: Self-pay | Admitting: Family Medicine

## 2017-08-29 MED ORDER — ACCU-CHEK AVIVA PLUS VI STRP: ORAL_STRIP | 12 refills | Status: DC

## 2017-08-29 MED ORDER — INSULIN LISPRO 100 UNIT/ML (KWIKPEN)
PEN_INJECTOR | SUBCUTANEOUS | 12 refills | Status: DC
Start: 2017-08-29 — End: 2018-09-10

## 2017-08-29 NOTE — Telephone Encounter (Signed)
Patient needs refills on Humalog pens and Accuchek Aviva strips sent to [facility] on [address].

## 2017-08-29 NOTE — Telephone Encounter (Signed)
Please review. Thanks!  

## 2017-08-31 ENCOUNTER — Ambulatory Visit: Payer: Medicare Other | Admitting: Family Medicine

## 2017-08-31 DIAGNOSIS — I482 Chronic atrial fibrillation, unspecified: Secondary | ICD-10-CM

## 2017-08-31 LAB — POCT INR
INR: 2.4 (ref 2.0–3.0)
PT: 28.9

## 2017-08-31 NOTE — Patient Instructions (Signed)
Description   Dx: Atrial Fibrillation  Continue 3mg  qd  Recheck 4 weeks

## 2017-09-07 ENCOUNTER — Telehealth: Payer: Self-pay | Admitting: Cardiovascular Disease

## 2017-09-07 NOTE — Telephone Encounter (Signed)
lmov to reschedule appt .

## 2017-09-07 NOTE — Telephone Encounter (Signed)
-----   Message from Britt [place], [place] sent at 09/07/2017  8:06 AM EDT ----- Regarding: Early Follow up Good morning, Pt last seen 11/19 told to f/u in 12 months. Pt next visit is 09/17/2017 w/Dr. Rockey Situ. Pt is f/u 3 months earlier than requested. Please notify pt if she needs to be rescheduled.  Thank you, Lenda Kelp

## 2017-09-11 NOTE — Telephone Encounter (Signed)
Patient rescheduled for 11/18 with Dr. Rockey Situ

## 2017-09-13 ENCOUNTER — Other Ambulatory Visit: Payer: Self-pay | Admitting: Family Medicine

## 2017-09-17 ENCOUNTER — Ambulatory Visit: Payer: Medicare Other | Admitting: Cardiovascular Disease

## 2017-09-17 DIAGNOSIS — I119 Hypertensive heart disease without heart failure: Secondary | ICD-10-CM | POA: Diagnosis not present

## 2017-09-17 DIAGNOSIS — E119 Type 2 diabetes mellitus without complications: Secondary | ICD-10-CM | POA: Diagnosis not present

## 2017-09-17 DIAGNOSIS — Z794 Long term (current) use of insulin: Secondary | ICD-10-CM | POA: Diagnosis not present

## 2017-09-18 LAB — COMPREHENSIVE METABOLIC PANEL
A/G RATIO: 1.6 (ref 1.2–2.2)
ALBUMIN: 4.2 g/dL (ref 3.5–4.7)
ALT: 13 IU/L (ref 0–32)
AST: 21 IU/L (ref 0–40)
Alkaline Phosphatase: 98 IU/L (ref 39–117)
BUN / CREAT RATIO: 21 (ref 12–28)
BUN: 21 mg/dL (ref 8–27)
Bilirubin Total: 0.7 mg/dL (ref 0.0–1.2)
CALCIUM: 8.6 mg/dL — AB (ref 8.7–10.3)
CO2: 22 mmol/L (ref 20–29)
Chloride: 100 mmol/L (ref 96–106)
Creatinine, Ser: 0.99 mg/dL (ref 0.57–1.00)
GFR, EST AFRICAN AMERICAN: 61 mL/min/{1.73_m2} (ref >59)
GFR, EST NON AFRICAN AMERICAN: 53 mL/min/{1.73_m2} — AB (ref >59)
GLOBULIN, TOTAL: 2.6 g/dL (ref 1.5–4.5)
Glucose: 183 mg/dL — ABNORMAL HIGH (ref 65–99)
POTASSIUM: 5 mmol/L (ref 3.5–5.2)
SODIUM: 138 mmol/L (ref 134–144)
Total Protein: 6.8 g/dL (ref 6.0–8.5)

## 2017-09-18 LAB — TSH: TSH: 1.87 u[IU]/mL (ref 0.450–4.500)

## 2017-09-18 LAB — LIPID PANEL
CHOL/HDL RATIO: 4.2 ratio (ref 0.0–4.4)
CHOLESTEROL TOTAL: 210 mg/dL — AB (ref 100–199)
HDL: 50 mg/dL (ref >39)
LDL Calculated: 121 mg/dL — ABNORMAL HIGH (ref 0–99)
TRIGLYCERIDES: 194 mg/dL — AB (ref 0–149)
VLDL Cholesterol Cal: 39 mg/dL (ref 5–40)

## 2017-09-18 LAB — CBC WITH DIFFERENTIAL/PLATELET
Basophils Absolute: 0 10*3/uL (ref 0.0–0.2)
Basos: 0 %
EOS (ABSOLUTE): 0.2 10*3/uL (ref 0.0–0.4)
EOS: 3 %
HEMATOCRIT: 42 % (ref 34.0–46.6)
HEMOGLOBIN: 13.6 g/dL (ref 11.1–15.9)
IMMATURE GRANULOCYTES: 0 %
Immature Grans (Abs): 0 10*3/uL (ref 0.0–0.1)
Lymphocytes Absolute: 3.1 10*3/uL (ref 0.7–3.1)
Lymphs: 35 %
MCH: 30.2 pg (ref 26.6–33.0)
MCHC: 32.4 g/dL (ref 31.5–35.7)
MCV: 93 fL (ref 79–97)
MONOCYTES: 8 %
MONOS ABS: 0.7 10*3/uL (ref 0.1–0.9)
NEUTROS PCT: 54 %
Neutrophils Absolute: 4.7 10*3/uL (ref 1.4–7.0)
Platelets: 246 10*3/uL (ref 150–450)
RBC: 4.51 x10E6/uL (ref 3.77–5.28)
RDW: 13.8 % (ref 12.3–15.4)
WBC: 8.9 10*3/uL (ref 3.4–10.8)

## 2017-09-18 LAB — HEMOGLOBIN A1C
ESTIMATED AVERAGE GLUCOSE: 206 mg/dL
Hgb A1c MFr Bld: 8.8 % — ABNORMAL HIGH (ref 4.8–5.6)

## 2017-09-19 ENCOUNTER — Ambulatory Visit (INDEPENDENT_AMBULATORY_CARE_PROVIDER_SITE_OTHER): Payer: Medicare Other | Admitting: Family Medicine

## 2017-09-19 ENCOUNTER — Encounter: Payer: Self-pay | Admitting: Family Medicine

## 2017-09-19 VITALS — BP 124/68 | HR 79 | Ht 65.0 in | Wt 235.0 lb

## 2017-09-19 DIAGNOSIS — Z0000 Encounter for general adult medical examination without abnormal findings: Secondary | ICD-10-CM | POA: Diagnosis not present

## 2017-09-19 DIAGNOSIS — I4821 Permanent atrial fibrillation: Secondary | ICD-10-CM

## 2017-09-19 DIAGNOSIS — M150 Primary generalized (osteo)arthritis: Secondary | ICD-10-CM

## 2017-09-19 DIAGNOSIS — M159 Polyosteoarthritis, unspecified: Secondary | ICD-10-CM

## 2017-09-19 DIAGNOSIS — M5137 Other intervertebral disc degeneration, lumbosacral region: Secondary | ICD-10-CM

## 2017-09-19 MED ORDER — HYDROCODONE-ACETAMINOPHEN 10-325 MG PO TABS: 1.0000 | ORAL_TABLET | Freq: Four times a day (QID) | ORAL | 0 refills | Status: DC | PRN

## 2017-09-19 MED ORDER — LORAZEPAM 1 MG PO TABS: ORAL_TABLET | ORAL | 2 refills | Status: DC

## 2017-09-19 NOTE — Progress Notes (Signed)
Patient: Tracy Mann, Female    DOB: 1933/06/18, 84 y.o.   MRN: [id number] Visit Date: 09/19/2017  Today's Provider: Wilhemena Durie, MD   Chief Complaint  Patient presents with  . Annual Exam   Subjective:     Complete Physical [name] is a 84 y.o. female. She feels well. She reports exercising some. She reports she is sleeping fairly well.  ----------------------------------------------------------- PT would like to know if she should continue taking prescription Vitamin D or OTC Vitamin D.  Question on starting OTC Iron she was anemic in the past.  Pt also has questions on Lorazepam.    Review of Systems  Constitutional: Negative.   HENT: Negative.   Eyes: Negative.   Respiratory: Negative.   Cardiovascular: Positive for leg swelling. Negative for chest pain and palpitations.  Gastrointestinal: Negative.   Endocrine: Negative.   Genitourinary: Negative.   Musculoskeletal: Positive for arthralgias, joint swelling and myalgias. Negative for back pain, gait problem, neck pain and neck stiffness.  Skin: Negative.   Allergic/Immunologic: Positive for environmental allergies. Negative for food allergies and immunocompromised state.  Neurological: Negative.   Hematological: Negative.   Psychiatric/Behavioral: Negative.     Social History   Socioeconomic History  . Marital status: Widowed    Spouse name: widowed  . Number of children: 4  . Years of education: 72  . Highest education level: 12th grade  Occupational History  . Occupation: retired  Scientific laboratory technician  . Financial resource strain: Not hard at all  . Food insecurity:    Worry: Never true    Inability: Never true  . Transportation needs:    Medical: No    Non-medical: No  Tobacco Use  . Smoking status: Never Smoker  . Smokeless tobacco: Never Used  Substance and Sexual Activity  . Alcohol use: No  . Drug use: No  . Sexual activity: Never  Lifestyle  . Physical activity:    Days per  week: Not on file    Minutes per session: Not on file  . Stress: Not at all  Relationships  . Social connections:    Talks on phone: Not on file    Gets together: Not on file    Attends religious service: Not on file    Active member of club or organization: Not on file    Attends meetings of clubs or organizations: Not on file    Relationship status: Not on file  . Intimate partner violence:    Fear of current or ex partner: Not on file    Emotionally abused: Not on file    Physically abused: Not on file    Forced sexual activity: Not on file  Other Topics Concern  . Not on file  Social History Narrative   Widowed   Has children and grandchildren   Lives in [place]    Past Medical History:  Diagnosis Date  . Acute myocardial infarction of inferior wall ([place])    a. 10/2005 - Presumed to be 2/2 to a coronary embolus in setting of persistent Afib-->Cath relatively nl cors, EF 45-50% w/ severe distal inferior/apical inferior HK w/ aneursymal appearance.  . Chronic anticoagulation    a. warfarin.  . Chronic atrial fibrillation (HCC)    a. CHA2DS2VASc = 6--> warfarin.  . Chronic combined systolic and diastolic CHF (congestive heart failure) ([place])    a. 10/2012 Echo: EF 35-40%, diff HK, mild MR, mild biatrial enlargement;  b. 11/2012 EF 45-50%  by LV gram; c. echo 07/2014: EF 40-45%, mod conc LVH, mod MR, severe biatrial enlargement, PASP 45 mm Hg  . Essential hypertension   . Hyperlipidemia, mixed   . Mixed Ischemic/Nonischemic Cardiomyopathy    a. 10/2012 Echo: EF 35-40%;  b. 11/2012 EF 45-50% by LV gram.  . Obesity   . Thyroid disease    hypothyroidism  . Type II diabetes mellitus ([place])   . Vitamin D deficiency      Patient Active Problem List   Diagnosis Date Noted  . Angina pectoris ([place]) 06/07/2015  . Angina at rest [facility]) 05/20/2015  . Weakness 03/03/2015  . Mixed Ischemic/Nonischemic Cardiomyopathy   . Type II diabetes mellitus ([place])   . Chronic atrial  fibrillation ([place])   . Chronic combined systolic and diastolic CHF (congestive heart failure) ([place])   . Chronic anticoagulation   . Absolute anemia 07/08/2014  . Airway hyperreactivity 07/08/2014  . Cervical muscle strain 07/08/2014  . PNA (pneumonia) 07/08/2014  . Chest pressure 07/08/2014  . Clinical depression 07/08/2014  . Degeneration of lumbar or lumbosacral intervertebral disc 07/08/2014  . Accumulation of fluid in tissues 07/08/2014  . Anxiety, generalized 07/08/2014  . Acid reflux 07/08/2014  . Folliculitis 39/76/7341  . Adult hypothyroidism 07/08/2014  . Broken leg 07/08/2014  . Cannot sleep 07/08/2014  . Obstructive sleep apnea 07/08/2014  . Arthritis, degenerative 07/08/2014  . Onychia of finger 07/08/2014  . Psoriasis 07/08/2014  . Restless legs syndrome 07/08/2014  . Adult BMI 30+ 07/08/2014  . Calculus of kidney 10/31/2011  . Bladder infection, chronic 10/31/2011  . Incomplete bladder emptying 10/31/2011  . Obesity 11/02/2010  . SHORTNESS OF BREATH 02/01/2010  . Hyperlipidemia 09/16/2008  . CAD, NATIVE VESSEL 09/16/2008  . Atrial fibrillation ([place]) 09/16/2008    Past Surgical History:  Procedure Laterality Date  . CARDIAC CATHETERIZATION  11/2012   [facility];EF 45-50%  . CHOLECYSTECTOMY  1999  . TUBAL LIGATION  1968  . VESICOVAGINAL FISTULA CLOSURE W/ TAH  1996    Her family history includes Heart disease in her mother; Thyroid disease in her father.      Current Outpatient Medications:  .  ACCU-CHEK AVIVA PLUS test strip, May check blood sugars up to twice daily., Disp: 100 each, Rfl: 12 .  amoxicillin-clavulanate (AUGMENTIN) 875-125 MG tablet, Take 1 tablet by mouth 3 (three) times daily., Disp: 30 tablet, Rfl: 1 .  Coenzyme Q10 (CO Q 10 PO), Take by mouth., Disp: , Rfl:  .  COMBIVENT RESPIMAT 20-100 MCG/ACT AERS respimat, 1 puff every 6 (six) hours as needed. , Disp: , Rfl:  .  ezetimibe (ZETIA) 10 MG tablet, TAKE 1 TABLET BY MOUTH EVERY DAY, Disp: 90  tablet, Rfl: 3 .  famotidine (PEPCID) 40 MG tablet, TAKE 1 TABLET BY MOUTH EVERY DAY, Disp: 30 tablet, Rfl: 11 .  fluticasone (FLONASE) 50 MCG/ACT nasal spray, SPRAY TWO SPRAYS IN EACH NOSTRIL ONCE DAILY, Disp: 16 g, Rfl: 11 .  furosemide (LASIX) 20 MG tablet, TAKE 1 TABLET (20 MG TOTAL) BY MOUTH 2 (TWO) TIMES DAILY AS NEEDED., Disp: 180 tablet, Rfl: 1 .  HYDROcodone-acetaminophen (NORCO) 10-325 MG tablet, Take 1 tablet by mouth every 6 (six) hours as needed., Disp: 120 tablet, Rfl: 0 .  HYDROcodone-acetaminophen (NORCO) 10-325 MG tablet, Take 1 tablet by mouth every 6 (six) hours as needed., Disp: 120 tablet, Rfl: 0 .  HYDROcodone-acetaminophen (NORCO) 10-325 MG tablet, Take 1 tablet by mouth every 6 (six) hours as needed., Disp: 120 tablet, Rfl: 0 .  insulin lispro (HUMALOG) 100 UNIT/ML KiwkPen, Inject 10-20 units three times daily with each meal., Disp: 15 mL, Rfl: 12 .  levothyroxine (SYNTHROID, LEVOTHROID) 125 MCG tablet, Take 125 mcg by mouth daily before breakfast., Disp: , Rfl:  .  lisinopril (PRINIVIL,ZESTRIL) 2.5 MG tablet, TAKE 1 TABLET BY MOUTH AT BEDTIME, Disp: 30 tablet, Rfl: 11 .  LORazepam (ATIVAN) 1 MG tablet, TAKE 1 TABLET BY MOUTH TWICE A DAY FOR ANXIETY, Disp: 60 tablet, Rfl: 2 .  Magnesium 500 MG CAPS, Take 500 mg by mouth daily. , Disp: , Rfl:  .  metoprolol succinate (TOPROL-XL) 25 MG 24 hr tablet, Take 12.5 mg by mouth 2 (two) times daily., Disp: , Rfl:  .  metoprolol succinate (TOPROL-XL) 25 MG 24 hr tablet, TAKE 1 TABLET (25 MG TOTAL) BY MOUTH 2 (TWO) TIMES DAILY., Disp: 180 tablet, Rfl: 2 .  nitrofurantoin, macrocrystal-monohydrate, (MACROBID) 100 MG capsule, TAKE 1 CAPSULE BY MOUTH EVERY DAY, Disp: 30 capsule, Rfl: 10 .  nitroGLYCERIN (NITROSTAT) 0.4 MG SL tablet, PACE 1 TABLET UNDER TONGUE EVERY 5 MINUTES AS NEEDED MAY REPEAT FOR UP TO 3 DOSES, Disp: 25 tablet, Rfl: 0 .  rOPINIRole (REQUIP) 0.25 MG tablet, TAKE 1 TABLET BY MOUTH EVERYDAY AT BEDTIME (Patient taking  differently: TAKE 1 TABLET BY MOUTH EVERYDAY AT BEDTIME as needed), Disp: 30 tablet, Rfl: 5 .  vitamin B-12 (CYANOCOBALAMIN) 1000 MCG tablet, Take 1,000 mcg by mouth daily., Disp: , Rfl:  .  Vitamin D, Ergocalciferol, (DRISDOL) 50000 units CAPS capsule, Take 50,000 Units by mouth. , Disp: , Rfl:  .  warfarin (COUMADIN) 2 MG tablet, Take 1 tablet (2 mg total) by mouth daily., Disp: 30 tablet, Rfl: 3 .  warfarin (COUMADIN) 3 MG tablet, TAKE 1 TABLET (3 MG TOTAL) BY MOUTH DAILY., Disp: 30 tablet, Rfl: 12 .  warfarin (COUMADIN) 4 MG tablet, Take 1 tablet (4 mg total) by mouth daily., Disp: 30 tablet, Rfl: 12  Patient Care Team: Jerrol Banana., MD as PCP - General (Family Medicine) Rockey Situ, Kathlene November, MD as Consulting Physician (Cardiology) Elease Etienne, MD as Consulting Physician (Internal Medicine)     Objective:   Vitals: There were no vitals taken for this visit.  Physical Exam  Constitutional: She appears well-developed and well-nourished.  HENT:  Head: Normocephalic and atraumatic.  Right Ear: External ear normal.  Left Ear: External ear normal.  Nose: Nose normal.  Mouth/Throat: Oropharynx is clear and moist.  Eyes: Conjunctivae are normal. No scleral icterus.  Neck: No thyromegaly present.  Cardiovascular: Normal rate, regular rhythm, normal heart sounds and intact distal pulses.  Pulmonary/Chest: Effort normal and breath sounds normal.  Abdominal: Soft.  Musculoskeletal: She exhibits edema.  Trace edema.  Skin: Skin is warm and dry.  Very fair skin.  Psychiatric: She has a normal mood and affect. Her behavior is normal. Judgment and thought content normal.    Activities of Daily Living In your present state of health, do you have any difficulty performing the following activities: 03/14/2017  Hearing? N  Vision? N  Difficulty concentrating or making decisions? N  Walking or climbing stairs? Y  Comment Due to SOB.  Dressing or bathing? N  Doing errands,  shopping? N  Preparing Food and eating ? N  Using the Toilet? N  In the past six months, have you accidently leaked urine? Y  Comment Occasionally when coughing,wears protection.  Do you have problems with loss of bowel control? N  Managing your Medications? N  Managing your Finances? N  Housekeeping or managing your Housekeeping? N  Some recent data might be hidden    Fall Risk Assessment Fall Risk  03/14/2017 01/28/2016 08/20/2014 07/08/2014  Falls in the past year? No No No No     Depression Screen PHQ 2/9 Scores 03/14/2017 03/14/2017 01/28/2016 08/20/2014  PHQ - 2 Score 0 0 1 1  PHQ- 9 Score 0 - - -       Assessment & Plan:    Annual Physical Reviewed patient's Family Medical History Reviewed and updated list of patient's medical providers Assessment of cognitive impairment was done Assessed patient's functional ability Established a written schedule for health screening [place] Completed and Reviewed  Exercise Activities and Dietary recommendations Goals    . Weight (lb) < 200 lb (90.7 kg)     Starting 01/28/16, I will focus on losing 10-15 lbs by the end of February 2018.     . Weight (lb) < 215 lb (97.5 kg)     Recommend to continue to walk daily to help aid in weight loss of 15 lbs or more.        Immunization History  Administered Date(s) Administered  . Pneumococcal Conjugate-13 12/08/2013  . Pneumococcal Polysaccharide-23 11/19/2012    Health Maintenance  Topic Date Due  . OPHTHALMOLOGY EXAM  07/14/2016  . INFLUENZA VACCINE  08/30/2017  . FOOT EXAM  09/19/2017  . TETANUS/TDAP  01/27/2026 (Originally 05/24/1952)  . HEMOGLOBIN A1C  03/20/2018  . DEXA SCAN  Completed  . PNA vac Low Risk Adult  Completed     Discussed health benefits of physical activity, and encouraged her to engage in regular exercise appropriate for her age and condition.  Pt declines mammogram,breast,pelvic,DRE. Obesity Low Vit D OA RF Norco--pt advised  to try to cut back on dose/frequency. HLD TIIDM AFib HTN   ------------------------------------------------------------------------------------------------------------   I have done the exam and reviewed the above chart and it is accurate to the best of my knowledge. Development worker, community has been used in this note in any air is in the dictation or transcription are unintentional.  Wilhemena Durie, MD  [place]

## 2017-09-25 NOTE — Progress Notes (Signed)
PT INR

## 2017-09-28 ENCOUNTER — Ambulatory Visit: Payer: Medicare Other

## 2017-09-28 DIAGNOSIS — I482 Chronic atrial fibrillation, unspecified: Secondary | ICD-10-CM

## 2017-09-28 LAB — POCT INR
INR: 1.5 — AB (ref 2.0–3.0)
PT: 18.1

## 2017-09-28 NOTE — Patient Instructions (Signed)
Description   Dx: Atrial Fibrillation  Dose change: Take 3 mg daily except 1 1/2 tablet Monday's and Fridays.(3mg  qd except 4.5mg ) Recheck 2 weeks

## 2017-10-12 ENCOUNTER — Ambulatory Visit: Payer: Medicare Other

## 2017-10-12 DIAGNOSIS — I482 Chronic atrial fibrillation, unspecified: Secondary | ICD-10-CM

## 2017-10-12 LAB — POCT INR
INR: 2.4 (ref 2.0–3.0)
PT: 28.9

## 2017-10-12 NOTE — Patient Instructions (Signed)
Description   Dx: Atrial Fibrillation  Dose change: Take 3 mg daily except 1 1/2 (4.5 mg) tablet Monday's and Fridays.(3mg  qd except 4.5mg ) Recheck 2 weeks

## 2017-10-26 ENCOUNTER — Ambulatory Visit: Payer: Medicare Other

## 2017-10-26 DIAGNOSIS — I482 Chronic atrial fibrillation, unspecified: Secondary | ICD-10-CM

## 2017-10-26 LAB — POCT INR
INR: 4.3 — AB (ref 2.0–3.0)
PT: 51

## 2017-10-26 NOTE — Patient Instructions (Signed)
Description   Dx: Atrial Fibrillation  Dose change: Hold 2 days, then start 3mg  daily.  Recheck 2 weeks

## 2017-10-28 ENCOUNTER — Other Ambulatory Visit: Payer: Self-pay | Admitting: Family Medicine

## 2017-11-09 ENCOUNTER — Ambulatory Visit: Payer: Medicare Other

## 2017-11-09 DIAGNOSIS — I482 Chronic atrial fibrillation, unspecified: Secondary | ICD-10-CM | POA: Diagnosis not present

## 2017-11-09 LAB — POCT INR
INR: 2.9 (ref 2.0–3.0)
PT: 34.7

## 2017-11-09 NOTE — Patient Instructions (Signed)
Description   Dx: Atrial Fibrillation Current Coumadin Dose: 3mg  daily PT: 34.7 INR: 2.9 Today's changes: NO CHANGE Recheck: 4 weeks

## 2017-12-07 ENCOUNTER — Ambulatory Visit: Payer: Self-pay

## 2017-12-11 ENCOUNTER — Ambulatory Visit: Payer: Medicare Other

## 2017-12-11 DIAGNOSIS — I482 Chronic atrial fibrillation, unspecified: Secondary | ICD-10-CM

## 2017-12-11 LAB — POCT INR
INR: 3.8 — AB (ref 2.0–3.0)
PT: 45.9

## 2017-12-17 ENCOUNTER — Ambulatory Visit: Payer: Medicare Other | Admitting: Cardiovascular Disease

## 2017-12-18 ENCOUNTER — Ambulatory Visit: Payer: Medicare Other

## 2017-12-18 DIAGNOSIS — I482 Chronic atrial fibrillation, unspecified: Secondary | ICD-10-CM

## 2017-12-18 LAB — POCT INR
INR: 1.5 — AB (ref 2.0–3.0)
PT: 17.5

## 2017-12-18 NOTE — Patient Instructions (Signed)
Description   Dx: Atrial Fibrillation Current Coumadin Dose: 3mg  daily Recheck: 2 weeks

## 2017-12-19 ENCOUNTER — Ambulatory Visit: Payer: Medicare Other | Admitting: Family Medicine

## 2017-12-19 VITALS — BP 138/68 | HR 84 | Temp 98.3°F | Resp 16 | Wt 234.0 lb

## 2017-12-19 DIAGNOSIS — E039 Hypothyroidism, unspecified: Secondary | ICD-10-CM

## 2017-12-19 DIAGNOSIS — I4821 Permanent atrial fibrillation: Secondary | ICD-10-CM

## 2017-12-19 DIAGNOSIS — I482 Chronic atrial fibrillation, unspecified: Secondary | ICD-10-CM

## 2017-12-19 DIAGNOSIS — M159 Polyosteoarthritis, unspecified: Secondary | ICD-10-CM

## 2017-12-19 DIAGNOSIS — I428 Other cardiomyopathies: Secondary | ICD-10-CM

## 2017-12-19 DIAGNOSIS — Z794 Long term (current) use of insulin: Secondary | ICD-10-CM

## 2017-12-19 DIAGNOSIS — E1159 Type 2 diabetes mellitus with other circulatory complications: Secondary | ICD-10-CM | POA: Diagnosis not present

## 2017-12-19 DIAGNOSIS — R079 Chest pain, unspecified: Secondary | ICD-10-CM | POA: Diagnosis not present

## 2017-12-19 DIAGNOSIS — E782 Mixed hyperlipidemia: Secondary | ICD-10-CM | POA: Diagnosis not present

## 2017-12-19 DIAGNOSIS — M150 Primary generalized (osteo)arthritis: Secondary | ICD-10-CM

## 2017-12-19 DIAGNOSIS — I5042 Chronic combined systolic (congestive) and diastolic (congestive) heart failure: Secondary | ICD-10-CM

## 2017-12-19 LAB — POCT GLYCOSYLATED HEMOGLOBIN (HGB A1C): Hemoglobin A1C: 7.9 % — AB (ref 4.0–5.6)

## 2017-12-19 MED ORDER — HYDROCODONE-ACETAMINOPHEN 10-325 MG PO TABS: 1.0000 | ORAL_TABLET | Freq: Four times a day (QID) | ORAL | 0 refills | Status: DC | PRN

## 2017-12-19 MED ORDER — ISOSORBIDE MONONITRATE ER 30 MG PO TB24: 30.0000 mg | ORAL_TABLET | Freq: Every day | ORAL | 6 refills | Status: DC

## 2017-12-19 MED ORDER — WARFARIN SODIUM 3 MG PO TABS: 3.0000 mg | ORAL_TABLET | Freq: Every day | ORAL | 12 refills | Status: DC

## 2017-12-19 MED ORDER — LORAZEPAM 1 MG PO TABS: ORAL_TABLET | ORAL | 2 refills | Status: DC

## 2017-12-19 MED ORDER — LEVOTHYROXINE SODIUM 125 MCG PO TABS: 125.0000 ug | ORAL_TABLET | Freq: Every day | ORAL | 12 refills | Status: DC

## 2017-12-19 NOTE — Progress Notes (Signed)
Tracy Mann  MRN: [id number] DOB: 06-Sep-1933  Subjective:  HPI   The patient is an 84 year old female who presents for follow up of chronic health.  She was last seen on 09/19/17.  She has had visits for her anticoagulation therapy.    Diabetes-The patient had her last A1C on 09/17/17 and it was 8.8.  The patient checks her glucose at home and has been getting readings that range 150-200 and an occasional reading of 300. After leaving on the visit she complained to the nurse that she was having chest pain and was subsequently did EKGs and gave patient nitroglycerin.  Due to her symptoms we observed her for about 40 minutes.  Patient Active Problem List   Diagnosis Date Noted  . Angina pectoris ([place]) 06/07/2015  . Angina at rest [facility]) 05/20/2015  . Weakness 03/03/2015  . Mixed Ischemic/Nonischemic Cardiomyopathy   . Type II diabetes mellitus ([place])   . Chronic atrial fibrillation   . Chronic combined systolic and diastolic CHF (congestive heart failure) ([place])   . Chronic anticoagulation   . Absolute anemia 07/08/2014  . Airway hyperreactivity 07/08/2014  . Cervical muscle strain 07/08/2014  . PNA (pneumonia) 07/08/2014  . Chest pressure 07/08/2014  . Clinical depression 07/08/2014  . Degeneration of lumbar or lumbosacral intervertebral disc 07/08/2014  . Accumulation of fluid in tissues 07/08/2014  . Anxiety, generalized 07/08/2014  . Acid reflux 07/08/2014  . Folliculitis 05/39/7673  . Adult hypothyroidism 07/08/2014  . Broken leg 07/08/2014  . Cannot sleep 07/08/2014  . Obstructive sleep apnea 07/08/2014  . Arthritis, degenerative 07/08/2014  . Onychia of finger 07/08/2014  . Psoriasis 07/08/2014  . Restless legs syndrome 07/08/2014  . Adult BMI 30+ 07/08/2014  . Calculus of kidney 10/31/2011  . Bladder infection, chronic 10/31/2011  . Incomplete bladder emptying 10/31/2011  . Obesity 11/02/2010  . SHORTNESS OF BREATH 02/01/2010  . Hyperlipidemia 09/16/2008  . CAD,  NATIVE VESSEL 09/16/2008  . Atrial fibrillation ([place]) 09/16/2008    Past Medical History:  Diagnosis Date  . Acute myocardial infarction of inferior wall ([place])    a. 10/2005 - Presumed to be 2/2 to a coronary embolus in setting of persistent Afib-->Cath relatively nl cors, EF 45-50% w/ severe distal inferior/apical inferior HK w/ aneursymal appearance.  . Chronic anticoagulation    a. warfarin.  . Chronic atrial fibrillation (HCC)    a. CHA2DS2VASc = 6--> warfarin.  . Chronic combined systolic and diastolic CHF (congestive heart failure) ([place])    a. 10/2012 Echo: EF 35-40%, diff HK, mild MR, mild biatrial enlargement;  b. 11/2012 EF 45-50% by LV gram; c. echo 07/2014: EF 40-45%, mod conc LVH, mod MR, severe biatrial enlargement, PASP 45 mm Hg  . Essential hypertension   . Hyperlipidemia, mixed   . Mixed Ischemic/Nonischemic Cardiomyopathy    a. 10/2012 Echo: EF 35-40%;  b. 11/2012 EF 45-50% by LV gram.  . Obesity   . Thyroid disease    hypothyroidism  . Type II diabetes mellitus ([place])   . Vitamin D deficiency     Social History   Socioeconomic History  . Marital status: Widowed    Spouse name: widowed  . Number of children: 4  . Years of education: 57  . Highest education level: 12th grade  Occupational History  . Occupation: retired  Scientific laboratory technician  . Financial resource strain: Not hard at all  . Food insecurity:    Worry: Never true    Inability: Never true  .  Transportation needs:    Medical: No    Non-medical: No  Tobacco Use  . Smoking status: Never Smoker  . Smokeless tobacco: Never Used  Substance and Sexual Activity  . Alcohol use: No  . Drug use: No  . Sexual activity: Never  Lifestyle  . Physical activity:    Days per week: Not on file    Minutes per session: Not on file  . Stress: Not at all  Relationships  . Social connections:    Talks on phone: Not on file    Gets together: Not on file    Attends religious service: Not on file    Active member of  club or organization: Not on file    Attends meetings of clubs or organizations: Not on file    Relationship status: Not on file  . Intimate partner violence:    Fear of current or ex partner: Not on file    Emotionally abused: Not on file    Physically abused: Not on file    Forced sexual activity: Not on file  Other Topics Concern  . Not on file  Social History Narrative   Widowed   Has children and grandchildren   Lives in [place]    Outpatient Encounter Medications as of 12/19/2017  Medication Sig  . ACCU-CHEK AVIVA PLUS test strip May check blood sugars up to twice daily.  . Coenzyme Q10 (CO Q 10 PO) Take by mouth.  . COMBIVENT RESPIMAT 20-100 MCG/ACT AERS respimat 1 puff every 6 (six) hours as needed.   . ezetimibe (ZETIA) 10 MG tablet TAKE 1 TABLET BY MOUTH EVERY DAY  . famotidine (PEPCID) 40 MG tablet TAKE 1 TABLET BY MOUTH EVERY DAY  . fluticasone (FLONASE) 50 MCG/ACT nasal spray SPRAY TWO SPRAYS IN EACH NOSTRIL ONCE DAILY  . furosemide (LASIX) 20 MG tablet TAKE 1 TABLET (20 MG TOTAL) BY MOUTH 2 (TWO) TIMES DAILY AS NEEDED.  Marland Kitchen HYDROcodone-acetaminophen (NORCO) 10-325 MG tablet Take 1 tablet by mouth every 6 (six) hours as needed.  Marland Kitchen HYDROcodone-acetaminophen (NORCO) 10-325 MG tablet Take 1 tablet by mouth every 6 (six) hours as needed.  Marland Kitchen HYDROcodone-acetaminophen (NORCO) 10-325 MG tablet Take 1 tablet by mouth every 6 (six) hours as needed.  . insulin lispro (HUMALOG) 100 UNIT/ML KiwkPen Inject 10-20 units three times daily with each meal.  . levothyroxine (SYNTHROID, LEVOTHROID) 125 MCG tablet Take 125 mcg by mouth daily before breakfast.  . lisinopril (PRINIVIL,ZESTRIL) 2.5 MG tablet TAKE 1 TABLET BY MOUTH AT BEDTIME  . LORazepam (ATIVAN) 1 MG tablet TAKE 1 TABLET BY MOUTH TWICE A DAY FOR ANXIETY  . Magnesium 500 MG CAPS Take 500 mg by mouth daily.   . nitrofurantoin, macrocrystal-monohydrate, (MACROBID) 100 MG capsule TAKE 1 CAPSULE BY MOUTH EVERY DAY  .  nitroGLYCERIN (NITROSTAT) 0.4 MG SL tablet PACE 1 TABLET UNDER TONGUE EVERY 5 MINUTES AS NEEDED MAY REPEAT FOR UP TO 3 DOSES  . rOPINIRole (REQUIP) 0.25 MG tablet TAKE 1 TABLET BY MOUTH EVERYDAY AT BEDTIME  . vitamin B-12 (CYANOCOBALAMIN) 1000 MCG tablet Take 1,000 mcg by mouth daily.  . Vitamin D, Ergocalciferol, (DRISDOL) 50000 units CAPS capsule Take 50,000 Units by mouth.   . warfarin (COUMADIN) 2 MG tablet Take 1 tablet (2 mg total) by mouth daily.  Marland Kitchen warfarin (COUMADIN) 3 MG tablet TAKE 1 TABLET (3 MG TOTAL) BY MOUTH DAILY.  Marland Kitchen warfarin (COUMADIN) 4 MG tablet Take 1 tablet (4 mg total) by mouth daily.  . [DISCONTINUED] metoprolol  succinate (TOPROL-XL) 25 MG 24 hr tablet Take 12.5 mg by mouth 2 (two) times daily.  . [DISCONTINUED] amoxicillin-clavulanate (AUGMENTIN) 875-125 MG tablet Take 1 tablet by mouth 3 (three) times daily.  . [DISCONTINUED] metoprolol succinate (TOPROL-XL) 25 MG 24 hr tablet TAKE 1 TABLET (25 MG TOTAL) BY MOUTH 2 (TWO) TIMES DAILY.   No facility-administered encounter medications on file as of 12/19/2017.     Allergies  Allergen Reactions  . Other Anaphylaxis and Swelling    'tongue swelling'  . Sulfa Antibiotics Anaphylaxis  . Clarithromycin Other (See Comments)    Hives, headaches, hard time swelling, felt like throat was closing up Hives, headaches, hard time swelling, felt like throat was closing up  . Diphenhydramine Other (See Comments)  . Estrogens Conjugated   . Estrogens, Conjugated Other (See Comments)  . Sulfonamide Derivatives Swelling    'tongue swelling'  . Nitrofurantoin Nausea Only    Review of Systems  Constitutional: Negative for fever and malaise/fatigue.  HENT: Negative.   Eyes: Negative.   Respiratory: Negative for cough, shortness of breath and wheezing.   Cardiovascular: Positive for chest pain. Negative for palpitations, orthopnea, claudication, leg swelling and PND.  Gastrointestinal: Negative.   Skin: Negative.   Neurological:  Negative.   Endo/Heme/Allergies: Negative.   Psychiatric/Behavioral: Negative.     Objective:  BP 138/68 (BP Location: Right Arm, Patient Position: Sitting, Cuff Size: Normal)   Pulse 84   Temp 98.3 F (36.8 C) (Oral)   Resp 16   Wt 234 lb (106.1 kg)   SpO2 98%   BMI 38.94 kg/m   Physical Exam  Constitutional: She is oriented to person, place, and time and well-developed, well-nourished, and in no distress.  HENT:  Head: Normocephalic and atraumatic.  Right Ear: External ear normal.  Left Ear: External ear normal.  Nose: Nose normal.  Mouth/Throat: Oropharynx is clear and moist.  Eyes: Conjunctivae are normal. No scleral icterus.  Cardiovascular: Normal rate, regular rhythm and normal heart sounds.  Pulmonary/Chest: Effort normal and breath sounds normal.  Abdominal: Soft.  Musculoskeletal: She exhibits no edema.  Neurological: She is alert and oriented to person, place, and time. Gait normal. GCS score is 15.  Skin: Skin is warm and dry.  Psychiatric: Mood, memory, affect and judgment normal.   ECG reveals atrial fibrillation with good rate control.  No Ischemic changes as compared to old ECG. Assessment and Plan :  1. Type 2 diabetes mellitus with other circulatory complication, with long-term current use of insulin (HCC)  - POCT glycosylated hemoglobin (Hb A1C)--7.9--was 8.8  2. Adult hypothyroidism   3. Mixed hyperlipidemia   4. Primary osteoarthritis involving multiple joints  - HYDROcodone-acetaminophen (NORCO) 10-325 MG tablet; Take 1 tablet by mouth every 6 (six) hours as needed.  Dispense: 120 tablet; Refill: 0 - HYDROcodone-acetaminophen (NORCO) 10-325 MG tablet; Take 1 tablet by mouth every 6 (six) hours as needed.  Dispense: 120 tablet; Refill: 0 - HYDROcodone-acetaminophen (NORCO) 10-325 MG tablet; Take 1 tablet by mouth every 6 (six) hours as needed.  Dispense: 120 tablet; Refill: 0  5. Chest pain, unspecified type This was nonspecific but at first the  patient said it felt like her angina in the past.  It was relieved with nitroglycerin within a few minutes.  No ECG changes.  Follow-up ECG was normal.  She had no associated symptoms.  Initially she had some pain in her left face and in the left axilla with this.  This all resolved and she felt well.  Time will discharge home.  She will follow-up with Dr. Rockey Situ.  Referral is made.  More than 50% of  50-minute visit is spent in coordination of care. - EKG 12-Lead - isosorbide mononitrate (IMDUR) 30 MG 24 hr tablet; Take 1 tablet (30 mg total) by mouth daily.  Dispense: 30 tablet; Refill: 6 - Ambulatory referral to Cardiology  6. Mixed Ischemic/Nonischemic Cardiomyopathy   7. Chronic atrial fibrillation   8. Chronic combined systolic and diastolic CHF (congestive heart failure) ([place])   9. Permanent atrial fibrillation  I have done the exam and reviewed the chart and it is accurate to the best of my knowledge. Development worker, community has been used and  any errors in dictation or transcription are unintentional. Miguel Aschoff M.D. [facility]

## 2017-12-25 ENCOUNTER — Ambulatory Visit: Payer: Medicare Other | Admitting: Family Medicine

## 2018-01-01 ENCOUNTER — Ambulatory Visit (INDEPENDENT_AMBULATORY_CARE_PROVIDER_SITE_OTHER): Payer: Medicare Other

## 2018-01-01 DIAGNOSIS — I482 Chronic atrial fibrillation, unspecified: Secondary | ICD-10-CM

## 2018-01-01 LAB — POCT INR
INR: 4.1 — AB (ref 2.0–3.0)
PT: 49.5

## 2018-01-01 NOTE — Patient Instructions (Signed)
Description   Dx: Atrial Fibrillation Current Coumadin Dose: 3mg  daily on M,W,F and then 2.5mg  Tues, Thurs, Sat,Sun Recheck: 2 weeks

## 2018-01-15 ENCOUNTER — Ambulatory Visit: Payer: Medicare Other

## 2018-01-15 ENCOUNTER — Other Ambulatory Visit: Payer: Self-pay

## 2018-01-15 DIAGNOSIS — I482 Chronic atrial fibrillation, unspecified: Secondary | ICD-10-CM | POA: Diagnosis not present

## 2018-01-15 LAB — POCT INR
INR: 3 (ref 2.0–3.0)
PT: 36.3

## 2018-01-15 MED ORDER — WARFARIN SODIUM 2.5 MG PO TABS: 2.5000 mg | ORAL_TABLET | Freq: Every day | ORAL | 1 refills | Status: DC

## 2018-01-15 NOTE — Patient Instructions (Signed)
Description   Dx: Atrial Fibrillation Current Coumadin Dose: 3mg  daily on M,W,F and then 2.5mg  Tues, Thurs, Sat,Sun Recheck: 3 weeks

## 2018-02-05 ENCOUNTER — Ambulatory Visit (INDEPENDENT_AMBULATORY_CARE_PROVIDER_SITE_OTHER): Payer: Medicare Other

## 2018-02-05 ENCOUNTER — Other Ambulatory Visit: Payer: Self-pay | Admitting: Family Medicine

## 2018-02-05 DIAGNOSIS — I482 Chronic atrial fibrillation, unspecified: Secondary | ICD-10-CM

## 2018-02-05 LAB — POCT INR
INR: 3.7 — AB (ref 2.0–3.0)
PT: 44.4

## 2018-02-05 NOTE — Telephone Encounter (Signed)
Please review.Ok to send?

## 2018-02-05 NOTE — Telephone Encounter (Signed)
Pt requesting Accu-Check test strips sent to  [facility].  Requesting it be sent in for 5 strips per day so she can check it at each meal time and at two snack times.

## 2018-02-05 NOTE — Patient Instructions (Signed)
Description   Dx: Atrial Fibrillation Current Coumadin Dose: 2.5 mg daily Recheck: 1 weeks

## 2018-02-07 ENCOUNTER — Other Ambulatory Visit: Payer: Self-pay | Admitting: Family Medicine

## 2018-02-11 ENCOUNTER — Telehealth: Payer: Self-pay | Admitting: Cardiovascular Disease

## 2018-02-11 NOTE — Telephone Encounter (Signed)
Patient calling States that she has been SOB and noticing a low heart rate from time to time, seen as low as 35  Patient would like to be seen sooner than 2/6 but refuses to see an assistant  Please call to discuss for a sooner appointment with Dr Rockey Situ

## 2018-02-11 NOTE — Telephone Encounter (Signed)
I spoke with the patient. She states that for the last 2 months she has had intermittent SOB with minimal exertion. She saw Dr. Rosanna Randy on 12/19/17 and per the patient, they ran several EKG's on her. EKG's reviewed in Epic show atrial fibrillation with slight variation in heart rates. The patient states her lowest heart rate at home has been 35 bpm ~ 12/31- she called the insurance nurse line and she was told to report to the ER for further evaulation- she declined at that time as she was unsure about her insurance coverage with the new year.  The patient reports that her pulse ox at home is showing O2 sats from 89-93% most of the time.  She states she is not checking her BP regularly, but it was "ok" the last time it was checked.  She is complaining of ongoing weakness and SOB with minimal exertion (just drinking liquid/ rolling over in bed).  She did feel pre-syncopal when her HR was ~ 35 bpm on 12/31.  Per her report, her average HR used to be ~ 70, but now it is mostly under this. However, she does have times when she goes above 100 bpm and she will take metoprolol (it is unclear if this is daily now or PRN).  Reviewed with Dr. Rockey Situ- offered possible ZIO monitor. I called the patient back and left a message (ok per patient) that I did not feel great about having her go unseen for 2 weeks waiting in the results of a ZIO. Spoke with our office manager and was given an ok to add the patient on tomorrow at 4:20 pm with Dr. Rockey Situ.  I left message for the patient that I will add her on, but to please call back today or in the AM to confirm this appt date/ time are ok for her.

## 2018-02-12 ENCOUNTER — Ambulatory Visit (INDEPENDENT_AMBULATORY_CARE_PROVIDER_SITE_OTHER): Payer: Medicare Other | Admitting: *Deleted

## 2018-02-12 ENCOUNTER — Encounter: Payer: Self-pay | Admitting: Cardiovascular Disease

## 2018-02-12 ENCOUNTER — Ambulatory Visit: Payer: Medicare Other | Admitting: Cardiovascular Disease

## 2018-02-12 ENCOUNTER — Ambulatory Visit (INDEPENDENT_AMBULATORY_CARE_PROVIDER_SITE_OTHER): Payer: Medicare Other | Admitting: Cardiovascular Disease

## 2018-02-12 ENCOUNTER — Other Ambulatory Visit: Payer: Self-pay

## 2018-02-12 ENCOUNTER — Other Ambulatory Visit: Payer: Self-pay | Admitting: Family Medicine

## 2018-02-12 VITALS — BP 162/84 | HR 81 | Ht 65.0 in | Wt 232.5 lb

## 2018-02-12 DIAGNOSIS — R0602 Shortness of breath: Secondary | ICD-10-CM

## 2018-02-12 DIAGNOSIS — I482 Chronic atrial fibrillation, unspecified: Secondary | ICD-10-CM

## 2018-02-12 LAB — POCT INR
INR: 2.7 (ref 2.0–3.0)
PT: 32.3

## 2018-02-12 MED ORDER — DILTIAZEM HCL 30 MG PO TABS: 30.0000 mg | ORAL_TABLET | Freq: Three times a day (TID) | ORAL | 3 refills | Status: DC | PRN

## 2018-02-12 MED ORDER — ACCU-CHEK AVIVA PLUS VI STRP: ORAL_STRIP | 12 refills | Status: DC

## 2018-02-12 MED ORDER — NITROGLYCERIN 0.4 MG SL SUBL: 0.4000 mg | SUBLINGUAL_TABLET | SUBLINGUAL | 3 refills | Status: DC | PRN

## 2018-02-12 NOTE — Telephone Encounter (Signed)
Patient calling back. I offered for patient to be seen on Thursday with [name] or [name]. Patient very adamant about only seeing Dr Tracy Mann and stressed that she had to be seen today. Was able to work patient in to earlier slot today and patient agreeable.

## 2018-02-12 NOTE — Patient Instructions (Signed)
Dx: Atrial Fibrillation Current Coumadin Dose: 2.5 mg daily Continue 2.5 mg daily Recheck: 2 weeks

## 2018-02-12 NOTE — Progress Notes (Signed)
Cardiology Office Note  Date:  02/12/2018   ID:  Tracy Mann, Tracy Mann Mar 04, 1933, MRN [id number]  PCP:  Jerrol Banana., MD   Chief Complaint  Patient presents with  . Other    Patient c/o increased SOB and heart rates as low as 35. Patietn states she has been like this for the past 2 months and feels like she is getting worse. Meds reviewed verbally with patient.    HPI:  Tracy Mann is a 85 year old woman with a PMH significant for  chronic atrial fibrillation on Coumadin therapy,  diastolic dysfunction, EF 40 to 45%  hypertension,  hyperlipidemia,  morbid obesity,   moderate pulmonary hypertension inferior wall myocardial infarction felt to be secondary to a thrombus from atrial fib in October 2007 at [facility],   episodes of chest pain  who presents for followup Of her coronary artery disease and leg edema.  On presentation today reports that she has been having worsening shortness of breath High fluid intake in the daytime Tries to remember to take Lasix daily.  Sometimes forgets or does not take it secondary to bladder spasm  For some chest tightness she was started on isosorbide which she stopped secondary to headache  Legs are weaker, dizzy standing up Orthostatics checked today with no drop in blood pressure Systolic maintaining 229 up to 150 with supine to standing position but still was dizzy  Feels her legs are getting weaker  More symptoms of smothering, breathing problems She is concerned when heart rate goes high 90-100 and takes metoprolol Several recent EKGs showing bradycardia, occasional pauses She reports occasional heart rate 35 when she wakes up  EKG personally reviewed by myself on todays visit shows atrial fibrillation with ventricular rate 81 bpm left bundle branch block, left anterior fascicular block  Other past medical history reviewed In follow-up today, she reports that she had an episode of chest pain 05/21/2015 on the  left side She went to the emergency room, was admitted. Hospital records reviewed. Cardiac enzymes were unrevealing, EKG unchanged. She had stress Myoview showing old inferior MI with no ischemia consistent with her known anatomy. She did not take nitroglycerin for her symptoms.  Previously seen by vein and vascular, Dr. Ronalee Belts, recommended to wear TED hose or ACE wraps It was mentioned that perhaps she could wear lymphedema compression pump. She does not have this yet She does sit down during the daytime with her legs down, unable to get her legs up very high Difficulty touching her toes, unable to put compression hose on  diabetes, currently on insulin. Followed by Dr. Blossom Hoops. Previous echocardiogram showing ejection fraction 35-40% in 2014 Follow-up cardiac catheterization showing ejection fraction 45-50%  Continued chronic knee problems, difficulty walking long distances  On a previous visit, she had worsening shortness of breath, poor energy.  Echocardiogram was done that showed moderately depressed ejection fraction of 35%. Previously she had been 50%. She felt poorly  with shortness of breath and fatigue with any exertion.   Cardiac catheterization was performed to rule out ischemia as a cause of her depressed ejection fraction. This was done on 12/05/2012. She had a right dominant coronary arterial system with mild luminal irregularities, no significant stenoses that could contribute to her depressed ejection fraction. EF was estimated at 45%. There was an aneurysmal/severely hypokinetic region in the distal inferior and apical inferior wall from old MI 2007.   Previous  echocardiogram several years ago showed ejection fraction 50%. Moderately  elevated right ventricular systolic pressures. Previous 48-hour Holter monitor, which showed that she was in persistent atrial fibrillation with one episode of rapid ventricular response.      total cholesterol 198, triglycerides 300,  LDL 104   PMH:   has a past medical history of Acute myocardial infarction of inferior wall (HCC), Chronic anticoagulation, Chronic atrial fibrillation, Chronic combined systolic and diastolic CHF (congestive heart failure) ([place]), Essential hypertension, Hyperlipidemia, mixed, Mixed Ischemic/Nonischemic Cardiomyopathy, Obesity, Thyroid disease, Type II diabetes mellitus ([place]), and Vitamin D deficiency.  PSH:    Past Surgical History:  Procedure Laterality Date  . CARDIAC CATHETERIZATION  11/2012   [facility];EF 45-50%  . CHOLECYSTECTOMY  1999  . TUBAL LIGATION  1968  . VESICOVAGINAL FISTULA CLOSURE W/ TAH  1996    Current Outpatient Medications  Medication Sig Dispense Refill  . ACCU-CHEK AVIVA PLUS test strip Use with meter to check glucose before meals and at bedtime 200 each 12  . Coenzyme Q10 (CO Q 10 PO) Take by mouth.    . COMBIVENT RESPIMAT 20-100 MCG/ACT AERS respimat 1 puff every 6 (six) hours as needed.     . ezetimibe (ZETIA) 10 MG tablet TAKE 1 TABLET BY MOUTH EVERY DAY 90 tablet 3  . famotidine (PEPCID) 40 MG tablet TAKE 1 TABLET BY MOUTH EVERY DAY 30 tablet 11  . fluticasone (FLONASE) 50 MCG/ACT nasal spray SPRAY TWO SPRAYS IN EACH NOSTRIL ONCE DAILY 16 g 11  . furosemide (LASIX) 20 MG tablet TAKE 1 TABLET (20 MG TOTAL) BY MOUTH 2 (TWO) TIMES DAILY AS NEEDED. 180 tablet 1  . HYDROcodone-acetaminophen (NORCO) 10-325 MG tablet Take 1 tablet by mouth every 6 (six) hours as needed. 120 tablet 0  . insulin lispro (HUMALOG) 100 UNIT/ML KiwkPen Inject 10-20 units three times daily with each meal. 15 mL 12  . isosorbide mononitrate (IMDUR) 30 MG 24 hr tablet Take 1 tablet (30 mg total) by mouth daily. 30 tablet 6  . levothyroxine (SYNTHROID, LEVOTHROID) 125 MCG tablet Take 1 tablet (125 mcg total) by mouth daily before breakfast. 30 tablet 12  . lisinopril (PRINIVIL,ZESTRIL) 2.5 MG tablet TAKE 1 TABLET BY MOUTH EVERYDAY AT BEDTIME 90 tablet 3  . LORazepam (ATIVAN) 1 MG tablet TAKE 1  TABLET BY MOUTH TWICE A DAY FOR ANXIETY 60 tablet 2  . Magnesium 500 MG CAPS Take 500 mg by mouth daily.     . nitrofurantoin, macrocrystal-monohydrate, (MACROBID) 100 MG capsule TAKE 1 CAPSULE BY MOUTH EVERY DAY 30 capsule 10  . nitroGLYCERIN (NITROSTAT) 0.4 MG SL tablet Place 1 tablet (0.4 mg total) under the tongue every 5 (five) minutes as needed for chest pain. 25 tablet 3  . rOPINIRole (REQUIP) 0.25 MG tablet TAKE 1 TABLET BY MOUTH EVERYDAY AT BEDTIME 90 tablet 1  . vitamin B-12 (CYANOCOBALAMIN) 1000 MCG tablet Take 1,000 mcg by mouth daily.    . Vitamin D, Ergocalciferol, (DRISDOL) 50000 units CAPS capsule Take 50,000 Units by mouth.     . warfarin (COUMADIN) 2 MG tablet Take 1 tablet (2 mg total) by mouth daily. 30 tablet 3  . warfarin (COUMADIN) 2.5 MG tablet TAKE 1 TABLET BY MOUTH EVERY DAY 30 tablet 1  . warfarin (COUMADIN) 3 MG tablet Take 1 tablet (3 mg total) by mouth daily. 30 tablet 12  . warfarin (COUMADIN) 4 MG tablet Take 1 tablet (4 mg total) by mouth daily. 30 tablet 12  . diltiazem (CARDIZEM) 30 MG tablet Take 1 tablet (30 mg total) by mouth  3 (three) times daily as needed. 90 tablet 3   No current facility-administered medications for this visit.      Allergies:   Other; Sulfa antibiotics; Clarithromycin; Diphenhydramine; Estrogens conjugated; Estrogens, conjugated; Sulfonamide derivatives; and Nitrofurantoin   Social History:  The patient  reports that she has never smoked. She has never used smokeless tobacco. She reports that she does not drink alcohol or use drugs.   Family History:   family history includes Heart disease in her mother; Thyroid disease in her father.    Review of Systems: Review of Systems  Constitutional: Negative.   Respiratory: Negative.   Cardiovascular: Positive for chest pain.  Gastrointestinal: Negative.   Musculoskeletal: Negative.   Neurological: Negative.   Psychiatric/Behavioral: Negative.   All other systems reviewed and are  negative.    PHYSICAL EXAM: VS:  BP (!) 162/84 (BP Location: Left Arm, Patient Position: Sitting, Cuff Size: Normal)   Pulse 81   Ht 5\' 5"  (1.651 m)   Wt 232 lb 8 oz (105.5 kg)   BMI 38.69 kg/m  , BMI Body mass index is 38.69 kg/m.  Constitutional:  oriented to person, place, and time. No distress.  Obese HENT:  Head: Grossly normal Eyes:  no discharge. No scleral icterus.  Neck: No JVD, no carotid bruits  Cardiovascular: Regular rate and rhythm, no murmurs appreciated 1+ pitting edema to the knees bilaterally Pulmonary/Chest: Clear to auscultation bilaterally, no wheezes or rails Abdominal: Soft.  no distension.  no tenderness.  Musculoskeletal: Normal range of motion Neurological:  normal muscle tone. Coordination normal. No atrophy Skin: Skin warm and dry Psychiatric: normal affect, pleasant  Recent Labs: 09/17/2017: ALT 13; BUN 21; Creatinine, Ser 0.99; Hemoglobin 13.6; Platelets 246; Potassium 5.0; Sodium 138; TSH 1.870    Lipid Panel Lab Results  Component Value Date   CHOL 210 (H) 09/17/2017   HDL 50 09/17/2017   LDLCALC 121 (H) 09/17/2017   TRIG 194 (H) 09/17/2017      Wt Readings from Last 3 Encounters:  02/12/18 232 lb 8 oz (105.5 kg)  12/19/17 234 lb (106.1 kg)  09/19/17 235 lb (106.6 kg)     ASSESSMENT AND PLAN: Chest pain Tightness in her chest possibly secondary to systolic and diastolic CHF Recommend she take Lasix twice a day for the next week given worsening lower extremity edema, worsening shortness of breath and chest fullness Echocardiogram ordered  Mixed hyperlipidemia Unable to tolerate statins, tolerating zetia  Cholesterol has gone back up, suspect she is not taking the Zetia  Essential hypertension - Plan: EKG 12-Lead Blood pressure mildly elevated on today's visit We will add extra Lasix as above  Atherosclerosis of native coronary artery of native heart without angina pectoris Prior inferior MI No significant change in  EKG Echocardiogram ordered In the past has had drop in ejection fraction with no correlation of worsening coronary disease  Mixed Ischemic/Nonischemic Cardiomyopathy - Plan: EKG 12-Lead Recommend she increase Lasix up to twice daily dosing until lower extremity edema improves  Chronic atrial fibrillation ([place]) - Plan: EKG 12-Lead Tolerating warfarin, rate well controlled Bradycardia on metoprolol We will hold metoprolol and recommend she take diltiazem 30 mg pills for heart rate more than 100 Suspect component of sick sinus syndrome  Chronic combined systolic and diastolic CHF (congestive heart failure) ([place]) Suspect she take Lasix 20 twice daily for leg edema abdominal bloating fullness chest fullness and shortness of breath consistent with CHF We will check a BNP, BMP when she comes in for  repeat echo  Shortness of breath - Plan: EKG 12-Lead Plan as above, extra Lasix Recommend she decrease her fluid intake, salt intake  Obstructive sleep apnea  Type 2 diabetes mellitus without complication, with long-term current use of insulin ([place]) Managed by primary care and endocrine HBA1C greater than 8  Complicated past medical history, long discussion with her concerning various causes of her shortness of breath including CHF, ischemic, atrial fibrillation  Total encounter time more than 45 minutes  Greater than 50% was spent in counseling and coordination of care with the patient  We will call her with the results of her echo Disposition:   F/U  3 months   Orders Placed This Encounter  Procedures  . Basic metabolic panel  . B Nat Peptide  . EKG 12-Lead  . ECHOCARDIOGRAM COMPLETE     Signed, Esmond Plants, M.D., Ph.D. 02/12/2018  [place], [place]

## 2018-02-12 NOTE — Patient Instructions (Addendum)
Medication Instructions:  For shortness of breath Take diltiazem for "speed control", heart rate >100 For fluid, take lasix 1 twice a day For chest tightness, take nitro SL  If you need a refill on your cardiac medications before your next appointment, please call your pharmacy.    Lab work: BMP and BNP when you come in for echo in the hospital  If you have labs (blood work) drawn today and your tests are completely normal, you will receive your results only by: Marland Kitchen MyChart Message (if you have MyChart) OR . A paper copy in the mail If you have any lab test that is abnormal or we need to change your treatment, we will call you to review the results.   Testing/Procedures: We will schedule an echocardiogram in the hospital  for shortness of breath   Follow-Up: At [facility], you and your health needs are our priority.  As part of our continuing mission to provide you with exceptional heart care, we have created designated Provider Care Teams.  These Care Teams include your primary Cardiologist (physician) and Advanced Practice Providers (APPs -  Physician Assistants and Nurse Practitioners) who all work together to provide you with the care you need, when you need it.  . You will need a follow up appointment in 2-3 months  . Providers on your designated Care Team:   . Murray Hodgkins, NP . Christell Faith, PA-C . Marrianne Mood, PA-C  Any Other Special Instructions Will Be Listed Below (If Applicable).  For educational health videos Log in to : [email or web address] Or : [email or web address], password : [email or web address]

## 2018-02-12 NOTE — Telephone Encounter (Signed)
Dr Rockey Situ unable to see patient this afternoon at 4:20pm. Need to offer patient appointment to see [name] or Thurmond Butts on Thursday instead. Left message for patient to call back as soon as she can.

## 2018-02-12 NOTE — Telephone Encounter (Signed)
Patient returning call to confirm appt today with Dr. Rockey Situ .

## 2018-02-13 ENCOUNTER — Other Ambulatory Visit
Admission: RE | Admit: 2018-02-13 | Discharge: 2018-02-13 | Disposition: A | Discharge status: Home / Self Care | Payer: Medicare Other | Source: Ambulatory Visit | Attending: Cardiovascular Disease | Admitting: Cardiovascular Disease

## 2018-02-13 DIAGNOSIS — R0602 Shortness of breath: Secondary | ICD-10-CM | POA: Diagnosis not present

## 2018-02-13 LAB — BASIC METABOLIC PANEL
ANION GAP: 10 (ref 5–15)
BUN: 24 mg/dL — ABNORMAL HIGH (ref 8–23)
CO2: 25 mmol/L (ref 22–32)
Calcium: 9 mg/dL (ref 8.9–10.3)
Chloride: 101 mmol/L (ref 98–111)
Creatinine, Ser: 0.76 mg/dL (ref 0.44–1.00)
GFR calc Af Amer: >60 mL/min (ref >60)
GFR calc non Af Amer: >60 mL/min (ref >60)
Glucose, Bld: 254 mg/dL — ABNORMAL HIGH (ref 70–99)
Potassium: 4.2 mmol/L (ref 3.5–5.1)
Sodium: 136 mmol/L (ref 135–145)

## 2018-02-13 LAB — BRAIN NATRIURETIC PEPTIDE: B Natriuretic Peptide: 78 pg/mL (ref 0.0–100.0)

## 2018-02-14 ENCOUNTER — Other Ambulatory Visit: Payer: Self-pay | Admitting: Family Medicine

## 2018-02-14 ENCOUNTER — Telehealth: Payer: Self-pay

## 2018-02-14 DIAGNOSIS — M150 Primary generalized (osteo)arthritis: Principal | ICD-10-CM

## 2018-02-14 DIAGNOSIS — M159 Polyosteoarthritis, unspecified: Secondary | ICD-10-CM

## 2018-02-14 MED ORDER — HYDROCODONE-ACETAMINOPHEN 10-325 MG PO TABS: 1.0000 | ORAL_TABLET | Freq: Four times a day (QID) | ORAL | 0 refills | Status: DC | PRN

## 2018-02-14 NOTE — Telephone Encounter (Signed)
Call to patient to review blood work. Pt verbalized understanding. She will be at echo appt tomorrow.  Pt refers to have lab work done in clinic in 2 weeks. Call forwarded to scheduling for appt.  Advised pt to call for any further questions or concerns.

## 2018-02-14 NOTE — Telephone Encounter (Signed)
-----   Message from Minna Merritts, MD sent at 02/13/2018 10:14 PM EST ----- Would recheck bmp in 2 weeks BNP does not suggest much fluid, number is low Will see how the echo looks

## 2018-02-14 NOTE — Telephone Encounter (Signed)
Pt is calling requesting this to be filled today if possible. Pt wanting a call back to let her know this has been filled.  Thanks, [employer]

## 2018-02-14 NOTE — Telephone Encounter (Signed)
I will this time but usually 48 hours.

## 2018-02-14 NOTE — Telephone Encounter (Signed)
Pharmacy called regarding:  HYDROcodone-acetaminophen ([place]) 10-325 MG tablet  [facility].  This Rx is not instock there.  Pt [facility] [address] [facility] [place].  Thanks, [employer]

## 2018-02-15 ENCOUNTER — Other Ambulatory Visit: Payer: Self-pay

## 2018-02-15 ENCOUNTER — Ambulatory Visit (INDEPENDENT_AMBULATORY_CARE_PROVIDER_SITE_OTHER): Payer: Medicare Other

## 2018-02-15 DIAGNOSIS — R0602 Shortness of breath: Secondary | ICD-10-CM

## 2018-02-15 MED ORDER — PERFLUTREN LIPID MICROSPHERE
1.0000 mL | INTRAVENOUS | Status: AC | PRN
  Administered 2018-02-15: 2 mL via INTRAVENOUS

## 2018-02-18 ENCOUNTER — Telehealth: Payer: Self-pay

## 2018-02-18 NOTE — Telephone Encounter (Signed)
Call returned from patient. Results given. Pt verbalized understanding. F/u as planned. Advised pt to call for any further questions or concerns

## 2018-02-18 NOTE — Telephone Encounter (Signed)
Attempted to call patient. LMTCB 1/20  

## 2018-02-18 NOTE — Telephone Encounter (Signed)
-----   Message from Minna Merritts, MD sent at 02/17/2018  1:52 PM EST ----- Echo EF 40 to 45% Function unchanged from prior studies No significant valve disease No clear explanation based on echo for SOB symptoms

## 2018-02-19 ENCOUNTER — Telehealth: Payer: Self-pay | Admitting: Cardiovascular Disease

## 2018-02-19 NOTE — Telephone Encounter (Signed)
LMOV to schedule labs

## 2018-02-19 NOTE — Telephone Encounter (Signed)
-----   Message from [place].Daleen Bo, RN sent at 02/18/2018  3:54 PM EST ----- Regarding: Blood work scheduling Ms. [place] is due to have lab work next Tuesday and wants to know if she can be scheduled to do it here. I will verify that order is correct. Can you call her? Thanks, [place]

## 2018-02-26 ENCOUNTER — Ambulatory Visit (INDEPENDENT_AMBULATORY_CARE_PROVIDER_SITE_OTHER): Payer: Medicare Other | Admitting: *Deleted

## 2018-02-26 ENCOUNTER — Other Ambulatory Visit: Payer: Medicare Other

## 2018-02-26 DIAGNOSIS — R0602 Shortness of breath: Secondary | ICD-10-CM | POA: Diagnosis not present

## 2018-02-26 DIAGNOSIS — I482 Chronic atrial fibrillation, unspecified: Secondary | ICD-10-CM | POA: Diagnosis not present

## 2018-02-26 LAB — POCT INR
INR: 2.7 (ref 2.0–3.0)
PT: 32.8

## 2018-02-26 NOTE — Patient Instructions (Signed)
Dx: Atrial Fibrillation Current Coumadin Dose: 2.5 mg daily Continue 2.5 mg daily Recheck: 3 weeks

## 2018-02-27 LAB — SPECIMEN STATUS REPORT

## 2018-03-04 ENCOUNTER — Telehealth: Payer: Self-pay | Admitting: *Deleted

## 2018-03-04 LAB — BASIC METABOLIC PANEL
BUN/Creatinine Ratio: 23 (ref 12–28)
BUN: 18 mg/dL (ref 8–27)
CO2: 24 mmol/L (ref 20–29)
Calcium: 8.8 mg/dL (ref 8.7–10.3)
Chloride: 100 mmol/L (ref 96–106)
Creatinine, Ser: 0.77 mg/dL (ref 0.57–1.00)
GFR calc non Af Amer: 72 mL/min/{1.73_m2} (ref >59)
GFR, EST AFRICAN AMERICAN: 83 mL/min/{1.73_m2} (ref >59)
Glucose: 305 mg/dL — ABNORMAL HIGH (ref 65–99)
Potassium: 5 mmol/L (ref 3.5–5.2)
Sodium: 137 mmol/L (ref 134–144)

## 2018-03-04 LAB — BRAIN NATRIURETIC PEPTIDE: BNP: 65.9 pg/mL (ref 0.0–100.0)

## 2018-03-04 NOTE — Telephone Encounter (Signed)
Reviewed results and recommendations with patient to please check with PCP regarding elevated blood sugars. She verbalized understanding of our conversation with no further questions at this time.

## 2018-03-04 NOTE — Telephone Encounter (Signed)
Patient returning call.

## 2018-03-04 NOTE — Telephone Encounter (Signed)
Left voicemail message for patient to call back for lab results.

## 2018-03-04 NOTE — Telephone Encounter (Signed)
-----   Message from Minna Merritts, MD sent at 03/02/2018 10:41 AM EST ----- Bmp Labs good Except for markedly elevated sugars Needs to discuss with PMD

## 2018-03-06 ENCOUNTER — Other Ambulatory Visit: Payer: Self-pay | Admitting: Family Medicine

## 2018-03-07 ENCOUNTER — Telehealth: Payer: Self-pay | Admitting: Family Medicine

## 2018-03-07 ENCOUNTER — Ambulatory Visit: Payer: Medicare Other | Admitting: Cardiovascular Disease

## 2018-03-07 NOTE — Telephone Encounter (Signed)
Pt needs refill Reli On needles gage # 31 74mm 5/16 for her diabetic pens.  She uses [facility]   CB# [phone]  Thank s [name]

## 2018-03-08 MED ORDER — INSULIN PEN NEEDLE 31G X 8 MM MISC: 12 refills | Status: DC

## 2018-03-08 NOTE — Telephone Encounter (Signed)
Done

## 2018-03-11 ENCOUNTER — Telehealth: Payer: Self-pay

## 2018-03-11 NOTE — Telephone Encounter (Signed)
Called pt to schedule AWV and pt declined and stated she is not interested in scheduling an AWV at this time. FYI to PCP! -MM

## 2018-03-19 ENCOUNTER — Ambulatory Visit (INDEPENDENT_AMBULATORY_CARE_PROVIDER_SITE_OTHER): Payer: Medicare Other | Admitting: Family Medicine

## 2018-03-19 ENCOUNTER — Encounter: Payer: Self-pay | Admitting: Family Medicine

## 2018-03-19 ENCOUNTER — Other Ambulatory Visit: Payer: Self-pay

## 2018-03-19 VITALS — BP 138/70 | HR 96 | Ht 65.0 in | Wt 234.6 lb

## 2018-03-19 DIAGNOSIS — M150 Primary generalized (osteo)arthritis: Secondary | ICD-10-CM | POA: Diagnosis not present

## 2018-03-19 DIAGNOSIS — E1159 Type 2 diabetes mellitus with other circulatory complications: Secondary | ICD-10-CM | POA: Diagnosis not present

## 2018-03-19 DIAGNOSIS — I482 Chronic atrial fibrillation, unspecified: Secondary | ICD-10-CM

## 2018-03-19 DIAGNOSIS — Z794 Long term (current) use of insulin: Secondary | ICD-10-CM

## 2018-03-19 DIAGNOSIS — I4821 Permanent atrial fibrillation: Secondary | ICD-10-CM

## 2018-03-19 DIAGNOSIS — E039 Hypothyroidism, unspecified: Secondary | ICD-10-CM | POA: Diagnosis not present

## 2018-03-19 DIAGNOSIS — M159 Polyosteoarthritis, unspecified: Secondary | ICD-10-CM

## 2018-03-19 DIAGNOSIS — F411 Generalized anxiety disorder: Secondary | ICD-10-CM

## 2018-03-19 DIAGNOSIS — Z6839 Body mass index (BMI) 39.0-39.9, adult: Secondary | ICD-10-CM

## 2018-03-19 LAB — POCT INR
INR: 1.9 — AB (ref 2.0–3.0)
PT: 23.2

## 2018-03-19 MED ORDER — HYDROCODONE-ACETAMINOPHEN 10-325 MG PO TABS: 1.0000 | ORAL_TABLET | Freq: Four times a day (QID) | ORAL | 0 refills | Status: DC | PRN

## 2018-03-19 MED ORDER — LORAZEPAM 1 MG PO TABS: ORAL_TABLET | ORAL | 2 refills | Status: DC

## 2018-03-19 MED ORDER — HYDROCODONE-ACETAMINOPHEN 10-325 MG PO TABS: 1.0000 | ORAL_TABLET | Freq: Three times a day (TID) | ORAL | 0 refills | Status: DC | PRN

## 2018-03-19 MED ORDER — METFORMIN HCL ER (MOD) 1000 MG PO TB24: 1000.0000 mg | ORAL_TABLET | Freq: Every day | ORAL | 0 refills | Status: DC

## 2018-03-19 NOTE — Progress Notes (Signed)
Patient: Tracy Mann Female    DOB: [birth year]   85 y.o.   MRN: [id number] Visit Date: 03/19/2018  Today's Provider: Wilhemena Durie, MD   Chief Complaint  Patient presents with  . Diabetes    3 month fup  . Atrial Fibrillation    follow up   Subjective:     HPI   Diabetes Mellitus Type II, Follow-up and pain management  Lab Results  Component Value Date   HGBA1C 7.9 (A) 12/19/2017   HGBA1C 8.8 (H) 09/17/2017   HGBA1C 6.6 02/22/2016   Pt reports that she is wanting to try metformin slow release again.  She said that she has almost been able to go off her insulin about two years ago due to her diet changes.  Last seen for diabetes 3 months ago.  Management since then includes seen cardiologist and he took her off metoprolol. She reports good compliance with treatment. She is not having side effects.  Current symptoms include none and have been improving. Home blood sugar records: fasting range: 120 to 150 in the mornings  Episodes of hypoglycemia? yes  occasionally but not a lot   Current Insulin Regimen: sliding scale  Most Recent Eye Exam: not done Weight trend: stable Prior visit with dietician: no Current diet: well balanced Current exercise: walking  Pertinent Labs:    Component Value Date/Time   CHOL 210 (H) 09/17/2017 1130   TRIG 194 (H) 09/17/2017 1130   HDL 50 09/17/2017 1130   LDLCALC 121 (H) 09/17/2017 1130   CREATININE 0.77 02/26/2018 1321   CREATININE 0.94 01/23/2014 0406    Wt Readings from Last 3 Encounters:  03/19/18 234 lb 9.6 oz (106.4 kg)  02/12/18 232 lb 8 oz (105.5 kg)  12/19/17 234 lb (106.1 kg)   Patient claims chronic pain and anxiety.  We had again discussed the need to cut back on Ativan and Vicodin. ------------------------------------------------------------------------   Allergies  Allergen Reactions  . Other Anaphylaxis and Swelling    'tongue swelling'  . Sulfa Antibiotics Anaphylaxis  . Clarithromycin  Other (See Comments)    Hives, headaches, hard time swelling, felt like throat was closing up Hives, headaches, hard time swelling, felt like throat was closing up  . Diphenhydramine Other (See Comments)  . Estrogens Conjugated   . Estrogens, Conjugated Other (See Comments)  . Sulfonamide Derivatives Swelling    'tongue swelling'  . Nitrofurantoin Nausea Only     Current Outpatient Medications:  .  ACCU-CHEK AVIVA PLUS test strip, Use with meter to check glucose before meals and at bedtime, Disp: 200 each, Rfl: 12 .  Coenzyme Q10 (CO Q 10 PO), Take by mouth., Disp: , Rfl:  .  COMBIVENT RESPIMAT 20-100 MCG/ACT AERS respimat, 1 puff every 6 (six) hours as needed. , Disp: , Rfl:  .  diltiazem (CARDIZEM) 30 MG tablet, Take 1 tablet (30 mg total) by mouth 3 (three) times daily as needed., Disp: 90 tablet, Rfl: 3 .  ezetimibe (ZETIA) 10 MG tablet, TAKE 1 TABLET BY MOUTH EVERY DAY, Disp: 90 tablet, Rfl: 3 .  famotidine (PEPCID) 40 MG tablet, TAKE 1 TABLET BY MOUTH EVERY DAY, Disp: 30 tablet, Rfl: 11 .  fluticasone (FLONASE) 50 MCG/ACT nasal spray, SPRAY TWO SPRAYS IN EACH NOSTRIL ONCE DAILY, Disp: 16 g, Rfl: 11 .  furosemide (LASIX) 20 MG tablet, TAKE 1 TABLET (20 MG TOTAL) BY MOUTH 2 (TWO) TIMES DAILY AS NEEDED., Disp: 180 tablet, Rfl: 1 .  HYDROcodone-acetaminophen (NORCO) 10-325 MG tablet, Take 1 tablet by mouth every 6 (six) hours as needed., Disp: 120 tablet, Rfl: 0 .  insulin lispro (HUMALOG) 100 UNIT/ML KiwkPen, Inject 10-20 units three times daily with each meal., Disp: 15 mL, Rfl: 12 .  Insulin Pen Needle (RELION PEN NEEDLE 31G/8MM) 31G X 8 MM MISC, Use with insulin pen three times daily, Disp: 100 each, Rfl: 12 .  isosorbide mononitrate (IMDUR) 30 MG 24 hr tablet, Take 1 tablet (30 mg total) by mouth daily., Disp: 30 tablet, Rfl: 6 .  levothyroxine (SYNTHROID, LEVOTHROID) 125 MCG tablet, Take 1 tablet (125 mcg total) by mouth daily before breakfast., Disp: 30 tablet, Rfl: 12 .   lisinopril (PRINIVIL,ZESTRIL) 2.5 MG tablet, TAKE 1 TABLET BY MOUTH EVERYDAY AT BEDTIME, Disp: 90 tablet, Rfl: 3 .  LORazepam (ATIVAN) 1 MG tablet, TAKE 1 TABLET BY MOUTH TWICE A DAY FOR ANXIETY, Disp: 60 tablet, Rfl: 2 .  Magnesium 500 MG CAPS, Take 500 mg by mouth daily. , Disp: , Rfl:  .  nitrofurantoin, macrocrystal-monohydrate, (MACROBID) 100 MG capsule, TAKE 1 CAPSULE BY MOUTH EVERY DAY, Disp: 30 capsule, Rfl: 10 .  nitroGLYCERIN (NITROSTAT) 0.4 MG SL tablet, Place 1 tablet (0.4 mg total) under the tongue every 5 (five) minutes as needed for chest pain., Disp: 25 tablet, Rfl: 3 .  rOPINIRole (REQUIP) 0.25 MG tablet, TAKE 1 TABLET BY MOUTH EVERYDAY AT BEDTIME, Disp: 90 tablet, Rfl: 1 .  vitamin B-12 (CYANOCOBALAMIN) 1000 MCG tablet, Take 1,000 mcg by mouth daily., Disp: , Rfl:  .  Vitamin D, Ergocalciferol, (DRISDOL) 50000 units CAPS capsule, Take 50,000 Units by mouth. , Disp: , Rfl:  .  warfarin (COUMADIN) 2.5 MG tablet, TAKE 1 TABLET BY MOUTH EVERY DAY, Disp: 30 tablet, Rfl: 1 .  warfarin (COUMADIN) 2 MG tablet, Take 1 tablet (2 mg total) by mouth daily. (Patient not taking: Reported on 03/19/2018), Disp: 30 tablet, Rfl: 3 .  warfarin (COUMADIN) 3 MG tablet, Take 1 tablet (3 mg total) by mouth daily. (Patient not taking: Reported on 03/19/2018), Disp: 30 tablet, Rfl: 12 .  warfarin (COUMADIN) 4 MG tablet, Take 1 tablet (4 mg total) by mouth daily. (Patient not taking: Reported on 03/19/2018), Disp: 30 tablet, Rfl: 12  Review of Systems  Constitutional: Negative.   HENT: Negative.   Eyes: Negative.   Respiratory: Negative.   Cardiovascular: Negative.   Gastrointestinal: Negative.   Endocrine: Negative.   Genitourinary: Negative.   Musculoskeletal: Negative.   Skin: Negative.   Allergic/Immunologic: Negative.   Neurological: Negative.   Hematological: Negative.   Psychiatric/Behavioral: Negative.     Social History   Tobacco Use  . Smoking status: Never Smoker  . Smokeless  tobacco: Never Used  Substance Use Topics  . Alcohol use: No      Objective:   BP 138/70 (BP Location: Right Arm, Patient Position: Sitting, Cuff Size: Normal)   Pulse 96   Ht 5\' 5"  (1.651 m)   Wt 234 lb 9.6 oz (106.4 kg)   SpO2 97%   BMI 39.04 kg/m  Vitals:   03/19/18 1445  BP: 138/70  Pulse: 96  SpO2: 97%  Weight: 234 lb 9.6 oz (106.4 kg)  Height: 5\' 5"  (1.651 m)     Physical Exam Constitutional:      Appearance: She is well-developed. She is obese.  HENT:     Head: Normocephalic and atraumatic.     Right Ear: External ear normal.     Left Ear: External  ear normal.     Nose: Nose normal.  Eyes:     General: No scleral icterus.    Conjunctiva/sclera: Conjunctivae normal.  Neck:     Thyroid: No thyromegaly.  Cardiovascular:     Rate and Rhythm: Normal rate and regular rhythm.     Heart sounds: Normal heart sounds.  Pulmonary:     Effort: Pulmonary effort is normal.     Breath sounds: Normal breath sounds.  Abdominal:     Palpations: Abdomen is soft.  Musculoskeletal:     Comments: Trace edema.  Skin:    General: Skin is warm and dry.     Comments: Very fair skin.  Neurological:     General: No focal deficit present.     Mental Status: She is alert and oriented to person, place, and time.  Psychiatric:        Mood and Affect: Mood normal.        Behavior: Behavior normal.        Thought Content: Thought content normal.        Judgment: Judgment normal.         Assessment & Plan    1. Type 2 diabetes mellitus with other circulatory complication, with long-term current use of insulin (HCC) Follow-up A1c on next visit.  2. Permanent atrial fibrillation NR today 1.9.  Take double dose today and then back to regular dose tomorrow recheck PT in 2 weeks  3. Chronic atrial fibrillation  - POCT INR  4. Primary osteoarthritis involving multiple joints Certainly benefit from orthopedic referral in the future.  She declines presently. -  HYDROcodone-acetaminophen (NORCO) 10-325 MG tablet; Take 1 tablet by mouth every 6 (six) hours as needed.  Dispense: 120 tablet; Refill: 0  5. Adult hypothyroidism \  6. Class 2 severe obesity due to excess calories with serious comorbidity and body mass index (BMI) of 39.0 to 39.9 in adult [facility]) With diabetes, hypertension, osteoarthritis. 7.  Chronic anxiety     Wilhemena Durie, MD  [facility]

## 2018-03-21 ENCOUNTER — Ambulatory Visit: Payer: Self-pay | Admitting: Family Medicine

## 2018-04-03 ENCOUNTER — Other Ambulatory Visit: Payer: Self-pay | Admitting: Family Medicine

## 2018-04-03 NOTE — Telephone Encounter (Signed)
Pharmacy requesting refills. Thanks!  

## 2018-04-10 ENCOUNTER — Other Ambulatory Visit: Payer: Self-pay

## 2018-04-10 ENCOUNTER — Encounter: Payer: Self-pay | Admitting: Family Medicine

## 2018-04-10 ENCOUNTER — Other Ambulatory Visit: Payer: Self-pay | Admitting: Family Medicine

## 2018-04-10 ENCOUNTER — Ambulatory Visit (INDEPENDENT_AMBULATORY_CARE_PROVIDER_SITE_OTHER): Payer: Medicare Other | Admitting: Family Medicine

## 2018-04-10 VITALS — BP 126/76 | HR 84 | Temp 98.7°F | Resp 16 | Ht 65.0 in | Wt 234.0 lb

## 2018-04-10 DIAGNOSIS — I482 Chronic atrial fibrillation, unspecified: Secondary | ICD-10-CM

## 2018-04-10 DIAGNOSIS — E1159 Type 2 diabetes mellitus with other circulatory complications: Secondary | ICD-10-CM | POA: Diagnosis not present

## 2018-04-10 DIAGNOSIS — L570 Actinic keratosis: Secondary | ICD-10-CM

## 2018-04-10 DIAGNOSIS — Z794 Long term (current) use of insulin: Secondary | ICD-10-CM

## 2018-04-10 LAB — POCT INR: INR: 2.2 (ref 2.0–3.0)

## 2018-04-10 LAB — POCT GLYCOSYLATED HEMOGLOBIN (HGB A1C): Hemoglobin A1C: 8.4 % — AB (ref 4.0–5.6)

## 2018-04-10 NOTE — Progress Notes (Signed)
Patient: Tracy Mann Female    DOB: 11-21-1933   85 y.o.   MRN: [id number] Visit Date: 04/10/2018  Today's Provider: Wilhemena Durie, MD   Chief Complaint  Patient presents with  . Diabetes  . Atrial Fibrillation   Subjective:     HPI  Diabetes Mellitus Type II, Follow-up:   Lab Results  Component Value Date   HGBA1C 8.4 (A) 04/10/2018   HGBA1C 7.9 (A) 12/19/2017   HGBA1C 8.8 (H) 09/17/2017    Last seen for diabetes 1 months ago.  Management since then includes no changes . She reports good compliance with treatment. She is not having side effects.  Current symptoms include none and have been stable. Home blood sugar records: fasting range: 190s  Episodes of hypoglycemia? no   Current Insulin Regimen:  Most Recent Eye Exam:up to date.  Weight trend: stable Prior visit with dietician: No Current exercise: no regular exercise Current diet habits: well balanced   Pertinent Labs:    Component Value Date/Time   CHOL 210 (H) 09/17/2017 1130   TRIG 194 (H) 09/17/2017 1130   HDL 50 09/17/2017 1130   LDLCALC 121 (H) 09/17/2017 1130   CREATININE 0.77 02/26/2018 1321   CREATININE 0.94 01/23/2014 0406    Wt Readings from Last 3 Encounters:  04/10/18 234 lb (106.1 kg)  03/19/18 234 lb 9.6 oz (106.4 kg)  02/12/18 232 lb 8 oz (105.5 kg)     A-fib follow up: Patient is currently taking Coumadin 2.5mg  daily. No changes since last time. Patient denies any abnormal bleeding.   Lab Results  Component Value Date   INR 2.2 04/10/2018   INR 1.9 (A) 03/19/2018   INR 2.7 02/26/2018    Allergies  Allergen Reactions  . Other Anaphylaxis and Swelling    'tongue swelling'  . Sulfa Antibiotics Anaphylaxis  . Clarithromycin Other (See Comments)    Hives, headaches, hard time swelling, felt like throat was closing up Hives, headaches, hard time swelling, felt like throat was closing up  . Diphenhydramine Other (See Comments)  . Estrogens Conjugated   .  Estrogens, Conjugated Other (See Comments)  . Sulfonamide Derivatives Swelling    'tongue swelling'  . Nitrofurantoin Nausea Only     Current Outpatient Medications:  .  ACCU-CHEK AVIVA PLUS test strip, Use with meter to check glucose before meals and at bedtime, Disp: 200 each, Rfl: 12 .  Coenzyme Q10 (CO Q 10 PO), Take by mouth., Disp: , Rfl:  .  COMBIVENT RESPIMAT 20-100 MCG/ACT AERS respimat, 1 puff every 6 (six) hours as needed. , Disp: , Rfl:  .  diltiazem (CARDIZEM) 30 MG tablet, Take 1 tablet (30 mg total) by mouth 3 (three) times daily as needed., Disp: 90 tablet, Rfl: 3 .  ezetimibe (ZETIA) 10 MG tablet, TAKE 1 TABLET BY MOUTH EVERY DAY, Disp: 90 tablet, Rfl: 3 .  famotidine (PEPCID) 40 MG tablet, TAKE 1 TABLET BY MOUTH EVERY DAY, Disp: 30 tablet, Rfl: 11 .  fluticasone (FLONASE) 50 MCG/ACT nasal spray, SPRAY TWO SPRAYS IN EACH NOSTRIL ONCE DAILY, Disp: 16 g, Rfl: 11 .  furosemide (LASIX) 20 MG tablet, TAKE 1 TABLET (20 MG TOTAL) BY MOUTH 2 (TWO) TIMES DAILY AS NEEDED., Disp: 180 tablet, Rfl: 1 .  HYDROcodone-acetaminophen (NORCO) 10-325 MG tablet, Take 1 tablet by mouth every 6 (six) hours as needed., Disp: 120 tablet, Rfl: 0 .  insulin lispro (HUMALOG) 100 UNIT/ML KiwkPen, Inject 10-20 units three times  daily with each meal., Disp: 15 mL, Rfl: 12 .  Insulin Pen Needle (RELION PEN NEEDLE 31G/8MM) 31G X 8 MM MISC, Use with insulin pen three times daily, Disp: 100 each, Rfl: 12 .  isosorbide mononitrate (IMDUR) 30 MG 24 hr tablet, Take 1 tablet (30 mg total) by mouth daily., Disp: 30 tablet, Rfl: 6 .  levothyroxine (SYNTHROID, LEVOTHROID) 125 MCG tablet, Take 1 tablet (125 mcg total) by mouth daily before breakfast., Disp: 30 tablet, Rfl: 12 .  lisinopril (PRINIVIL,ZESTRIL) 2.5 MG tablet, TAKE 1 TABLET BY MOUTH EVERYDAY AT BEDTIME, Disp: 90 tablet, Rfl: 3 .  LORazepam (ATIVAN) 1 MG tablet, TAKE 1 TABLET BY MOUTH TWICE A DAY FOR ANXIETY, Disp: 60 tablet, Rfl: 2 .  Magnesium 500 MG  CAPS, Take 500 mg by mouth daily. , Disp: , Rfl:  .  nitroGLYCERIN (NITROSTAT) 0.4 MG SL tablet, Place 1 tablet (0.4 mg total) under the tongue every 5 (five) minutes as needed for chest pain., Disp: 25 tablet, Rfl: 3 .  rOPINIRole (REQUIP) 0.25 MG tablet, TAKE 1 TABLET BY MOUTH EVERYDAY AT BEDTIME, Disp: 90 tablet, Rfl: 1 .  warfarin (COUMADIN) 2.5 MG tablet, TAKE 1 TABLET BY MOUTH EVERY DAY, Disp: 30 tablet, Rfl: 1 .  HYDROcodone-acetaminophen (NORCO) 10-325 MG tablet, Take 1 tablet by mouth every 8 (eight) hours as needed., Disp: 30 tablet, Rfl: 0 .  HYDROcodone-acetaminophen (NORCO) 10-325 MG tablet, Take 1 tablet by mouth every 8 (eight) hours as needed., Disp: 30 tablet, Rfl: 0 .  metFORMIN (GLUCOPHAGE-XR) 500 MG 24 hr tablet, TAKE 2 TABLETS BY MOUTH EVERY DAY WITH BREAKFAST (Patient not taking: Reported on 04/10/2018), Disp: 60 tablet, Rfl: 11 .  nitrofurantoin, macrocrystal-monohydrate, (MACROBID) 100 MG capsule, TAKE 1 CAPSULE BY MOUTH EVERY DAY (Patient not taking: Reported on 04/10/2018), Disp: 30 capsule, Rfl: 10 .  vitamin B-12 (CYANOCOBALAMIN) 1000 MCG tablet, Take 1,000 mcg by mouth daily., Disp: , Rfl:  .  Vitamin D, Ergocalciferol, (DRISDOL) 50000 units CAPS capsule, Take 50,000 Units by mouth. , Disp: , Rfl:  .  warfarin (COUMADIN) 2 MG tablet, Take 1 tablet (2 mg total) by mouth daily. (Patient not taking: Reported on 03/19/2018), Disp: 30 tablet, Rfl: 3 .  warfarin (COUMADIN) 3 MG tablet, Take 1 tablet (3 mg total) by mouth daily. (Patient not taking: Reported on 03/19/2018), Disp: 30 tablet, Rfl: 12 .  warfarin (COUMADIN) 4 MG tablet, Take 1 tablet (4 mg total) by mouth daily. (Patient not taking: Reported on 03/19/2018), Disp: 30 tablet, Rfl: 12  Review of Systems  Constitutional: Negative for activity change, appetite change, chills, diaphoresis, fatigue and unexpected weight change.  HENT: Negative.   Respiratory: Negative for cough and shortness of breath.   Cardiovascular:  Negative for chest pain, palpitations and leg swelling.  Gastrointestinal: Negative.   Endocrine: Negative for cold intolerance, heat intolerance, polydipsia, polyphagia and polyuria.  Musculoskeletal: Positive for arthralgias.  Skin: Positive for rash.  Allergic/Immunologic: Negative for environmental allergies.  Neurological: Negative for dizziness, light-headedness and headaches.  Psychiatric/Behavioral: The patient is nervous/anxious.     Social History   Tobacco Use  . Smoking status: Never Smoker  . Smokeless tobacco: Never Used  Substance Use Topics  . Alcohol use: No      Objective:   BP 126/76 (BP Location: Left Arm, Patient Position: Sitting, Cuff Size: Large)   Pulse 84   Temp 98.7 F (37.1 C)   Resp 16   Ht 5\' 5"  (1.651 m)   Wt 234  lb (106.1 kg)   SpO2 97%   BMI 38.94 kg/m  Vitals:   04/10/18 1357  BP: 126/76  Pulse: 84  Resp: 16  Temp: 98.7 F (37.1 C)  SpO2: 97%  Weight: 234 lb (106.1 kg)  Height: 5\' 5"  (1.651 m)     Physical Exam Constitutional:      Appearance: She is well-developed. She is obese.  HENT:     Head: Normocephalic and atraumatic.     Right Ear: External ear normal.     Left Ear: External ear normal.     Nose: Nose normal.  Eyes:     General: No scleral icterus.    Conjunctiva/sclera: Conjunctivae normal.  Neck:     Thyroid: No thyromegaly.  Cardiovascular:     Rate and Rhythm: Normal rate and regular rhythm.     Heart sounds: Normal heart sounds.  Pulmonary:     Effort: Pulmonary effort is normal.     Breath sounds: Normal breath sounds.  Abdominal:     Palpations: Abdomen is soft.  Musculoskeletal:     Comments: Trace edema.  Skin:    General: Skin is warm and dry.     Comments: Very fair skin.  Significant AK's, especially arms and legs.  She has a  blackhead on her upper back  Neurological:     General: No focal deficit present.     Mental Status: She is alert and oriented to person, place, and time. Mental  status is at baseline.  Psychiatric:        Mood and Affect: Mood normal.        Behavior: Behavior normal.        Thought Content: Thought content normal.        Judgment: Judgment normal.         Assessment & Plan    1. Type 2 diabetes mellitus with other circulatory complication, with long-term current use of insulin ([place]) Patient admits to being totally noncompliant with her diet.  Advised her to work on this.  Will not adjust her treatment at this time.  Goal A1c of less than 7.5. - POCT glycosylated hemoglobin (Hb A1C)--8.4 today  2. Chronic atrial fibrillation On chronic Coumadin. - POCT INR  3. AK (actinic keratosis)  AK's.  Refer back to dermatology. - Ambulatory referral to Dermatology    I have done the exam and reviewed the above chart and it is accurate to the best of my knowledge. Development worker, community has been used in this note in any air is in the dictation or transcription are unintentional.  Wilhemena Durie, MD  [facility]

## 2018-04-12 ENCOUNTER — Other Ambulatory Visit: Payer: Self-pay | Admitting: Family Medicine

## 2018-04-12 NOTE — Telephone Encounter (Signed)
Pt called saying she went by [facility] in [facility] to check on her Rx for her pain medication Hydrocodone 10-325.  She said the pharmacy told her that on her Rx for March 18 th that is in the directions for her to take the medication one time a day.  She said she takes one every 6 hr.  CB#  [phone]  Thanks [name]

## 2018-04-15 NOTE — Telephone Encounter (Signed)
Dr Darnell Level It appears that on 03/19/18 when the patient was seen there was 1 Rx in the computer and the [place] had to bring down 2 more.  In doing so she did not change the directions or number of pills from what the Rx defaults.  Patient takes q 6 hours prn and gets # 120, only the first Rx was written that way.  The other 2 say every 8 hours and are only for # 30.  Patient will need to have 2 new prescriptions sent in with the corrected sig.

## 2018-04-16 ENCOUNTER — Telehealth: Payer: Self-pay | Admitting: Family Medicine

## 2018-04-16 MED ORDER — HYDROCODONE-ACETAMINOPHEN 10-325 MG PO TABS: 1.0000 | ORAL_TABLET | Freq: Four times a day (QID) | ORAL | 0 refills | Status: DC | PRN

## 2018-04-16 NOTE — Telephone Encounter (Signed)
Pt states she is feeling some better and has been taking tylenol for fever with last dose at 2:45pm today.  Made appt for 1:20 on 04/17/18 and will cancel if she feels she doesn't need to come in.  dbs

## 2018-04-16 NOTE — Telephone Encounter (Signed)
If she does not have any recent travel or any known exposures she is at very low risk for Covid.  With these symptoms she probably needs to be seen.

## 2018-04-16 NOTE — Telephone Encounter (Signed)
Spoke with the patient, and she reports that she has had symptoms for about 2 days. She reports that this came on all of a sudden. She has not traveled any where out of town. Only to the drug store and grocery store. She took 6 Tylenol last night, and reports that her temp was 99.1 this morning when she woke up. The highest her temp has gotten was 102.0 about 2 days ago. She has a cough, and the need to take an extra breath. She reports that her children wanted her to go to the ER, but she declined. She wanted to know what else she could do for treatment and what to do as far as precaution for others? Please advise. Thanks!

## 2018-04-16 NOTE — Telephone Encounter (Signed)
Pt called saying she was having body ache, fever last night of 102. But went down to 99 this morning with tylenol.  She has had a little bit of a cough. She mostly just has body aches.  She does not want to come in but would like someone to call her back  CB#  [phone]  Thanks teri

## 2018-04-17 ENCOUNTER — Ambulatory Visit (INDEPENDENT_AMBULATORY_CARE_PROVIDER_SITE_OTHER): Payer: Medicare Other | Admitting: Family Medicine

## 2018-04-17 ENCOUNTER — Ambulatory Visit: Payer: Medicare Other | Admitting: Cardiovascular Disease

## 2018-04-17 ENCOUNTER — Encounter: Payer: Self-pay | Admitting: Family Medicine

## 2018-04-17 ENCOUNTER — Other Ambulatory Visit: Payer: Self-pay

## 2018-04-17 VITALS — BP 128/76 | HR 84 | Temp 98.9°F | Resp 20 | Wt 238.0 lb

## 2018-04-17 DIAGNOSIS — M1990 Unspecified osteoarthritis, unspecified site: Secondary | ICD-10-CM

## 2018-04-17 DIAGNOSIS — J329 Chronic sinusitis, unspecified: Secondary | ICD-10-CM | POA: Diagnosis not present

## 2018-04-17 DIAGNOSIS — I428 Other cardiomyopathies: Secondary | ICD-10-CM

## 2018-04-17 DIAGNOSIS — I482 Chronic atrial fibrillation, unspecified: Secondary | ICD-10-CM | POA: Diagnosis not present

## 2018-04-17 MED ORDER — AMOXICILLIN-POT CLAVULANATE 875-125 MG PO TABS: 1.0000 | ORAL_TABLET | Freq: Two times a day (BID) | ORAL | 0 refills | Status: DC

## 2018-04-17 NOTE — Progress Notes (Signed)
Patient: Tracy Mann Female    DOB: September 10, 1933   85 y.o.   MRN: [id number] Visit Date: 04/17/2018  Today's Provider: Wilhemena Durie, MD   Chief Complaint  Patient presents with  . URI   Subjective:     URI   This is a new problem. The current episode started in the past 7 days (about 4 days). The problem has been unchanged. The maximum temperature recorded prior to her arrival was 102 - 102.9 F. The fever has been present for 1 to 2 days (was up to 100 last night). Associated symptoms include coughing and headaches. Pertinent negatives include no chest pain. She has tried acetaminophen for the symptoms. The treatment provided mild relief.   Patient reports that her fever only goes away with Tylenol. She has not traveled or been exposed to anyone with flu symptoms that she is aware of.  She has mild myalgias since she has felt poorly but not normally.  Again, no exposure to coronavirus at all.  Allergies  Allergen Reactions  . Other Anaphylaxis and Swelling    'tongue swelling'  . Sulfa Antibiotics Anaphylaxis  . Clarithromycin Other (See Comments)    Hives, headaches, hard time swelling, felt like throat was closing up Hives, headaches, hard time swelling, felt like throat was closing up  . Diphenhydramine Other (See Comments)  . Estrogens Conjugated   . Estrogens, Conjugated Other (See Comments)  . Sulfonamide Derivatives Swelling    'tongue swelling'  . Nitrofurantoin Nausea Only     Current Outpatient Medications:  .  ACCU-CHEK AVIVA PLUS test strip, Use with meter to check glucose before meals and at bedtime, Disp: 200 each, Rfl: 12 .  Coenzyme Q10 (CO Q 10 PO), Take by mouth., Disp: , Rfl:  .  COMBIVENT RESPIMAT 20-100 MCG/ACT AERS respimat, 1 puff every 6 (six) hours as needed. , Disp: , Rfl:  .  diltiazem (CARDIZEM) 30 MG tablet, Take 1 tablet (30 mg total) by mouth 3 (three) times daily as needed., Disp: 90 tablet, Rfl: 3 .  ezetimibe (ZETIA) 10 MG  tablet, TAKE 1 TABLET BY MOUTH EVERY DAY, Disp: 90 tablet, Rfl: 3 .  famotidine (PEPCID) 40 MG tablet, TAKE 1 TABLET BY MOUTH EVERY DAY, Disp: 30 tablet, Rfl: 11 .  fluticasone (FLONASE) 50 MCG/ACT nasal spray, SPRAY TWO SPRAYS IN EACH NOSTRIL ONCE DAILY, Disp: 16 g, Rfl: 11 .  furosemide (LASIX) 20 MG tablet, TAKE 1 TABLET (20 MG TOTAL) BY MOUTH 2 (TWO) TIMES DAILY AS NEEDED., Disp: 180 tablet, Rfl: 1 .  HYDROcodone-acetaminophen (NORCO) 10-325 MG tablet, Take 1 tablet by mouth every 6 (six) hours as needed., Disp: 120 tablet, Rfl: 0 .  insulin lispro (HUMALOG) 100 UNIT/ML KiwkPen, Inject 10-20 units three times daily with each meal., Disp: 15 mL, Rfl: 12 .  Insulin Pen Needle (RELION PEN NEEDLE 31G/8MM) 31G X 8 MM MISC, Use with insulin pen three times daily, Disp: 100 each, Rfl: 12 .  isosorbide mononitrate (IMDUR) 30 MG 24 hr tablet, Take 1 tablet (30 mg total) by mouth daily., Disp: 30 tablet, Rfl: 6 .  levothyroxine (SYNTHROID, LEVOTHROID) 125 MCG tablet, Take 1 tablet (125 mcg total) by mouth daily before breakfast., Disp: 30 tablet, Rfl: 12 .  lisinopril (PRINIVIL,ZESTRIL) 2.5 MG tablet, TAKE 1 TABLET BY MOUTH EVERYDAY AT BEDTIME, Disp: 90 tablet, Rfl: 3 .  LORazepam (ATIVAN) 1 MG tablet, TAKE 1 TABLET BY MOUTH TWICE A DAY FOR ANXIETY, Disp:  60 tablet, Rfl: 2 .  Magnesium 500 MG CAPS, Take 500 mg by mouth daily. , Disp: , Rfl:  .  nitroGLYCERIN (NITROSTAT) 0.4 MG SL tablet, Place 1 tablet (0.4 mg total) under the tongue every 5 (five) minutes as needed for chest pain., Disp: 25 tablet, Rfl: 3 .  rOPINIRole (REQUIP) 0.25 MG tablet, TAKE 1 TABLET BY MOUTH EVERYDAY AT BEDTIME, Disp: 90 tablet, Rfl: 1 .  vitamin B-12 (CYANOCOBALAMIN) 1000 MCG tablet, Take 1,000 mcg by mouth daily., Disp: , Rfl:  .  Vitamin D, Ergocalciferol, (DRISDOL) 50000 units CAPS capsule, Take 50,000 Units by mouth. , Disp: , Rfl:  .  warfarin (COUMADIN) 2.5 MG tablet, TAKE 1 TABLET BY MOUTH EVERY DAY, Disp: 30 tablet, Rfl:  1 .  HYDROcodone-acetaminophen (NORCO) 10-325 MG tablet, Take 1 tablet by mouth every 6 (six) hours as needed., Disp: 120 tablet, Rfl: 0 .  HYDROcodone-acetaminophen (NORCO) 10-325 MG tablet, Take 1 tablet by mouth every 6 (six) hours as needed., Disp: 120 tablet, Rfl: 0 .  metFORMIN (GLUCOPHAGE-XR) 500 MG 24 hr tablet, TAKE 2 TABLETS BY MOUTH EVERY DAY WITH BREAKFAST (Patient not taking: Reported on 04/10/2018), Disp: 60 tablet, Rfl: 11 .  nitrofurantoin, macrocrystal-monohydrate, (MACROBID) 100 MG capsule, TAKE 1 CAPSULE BY MOUTH EVERY DAY (Patient not taking: Reported on 04/10/2018), Disp: 30 capsule, Rfl: 10 .  warfarin (COUMADIN) 2 MG tablet, Take 1 tablet (2 mg total) by mouth daily. (Patient not taking: Reported on 03/19/2018), Disp: 30 tablet, Rfl: 3 .  warfarin (COUMADIN) 3 MG tablet, Take 1 tablet (3 mg total) by mouth daily. (Patient not taking: Reported on 03/19/2018), Disp: 30 tablet, Rfl: 12 .  warfarin (COUMADIN) 4 MG tablet, Take 1 tablet (4 mg total) by mouth daily. (Patient not taking: Reported on 03/19/2018), Disp: 30 tablet, Rfl: 12  Review of Systems  Constitutional: Positive for activity change, chills, fatigue and fever. Negative for appetite change.  Respiratory: Positive for cough and shortness of breath.   Cardiovascular: Negative for chest pain, palpitations and leg swelling.  Gastrointestinal: Negative.   Endocrine: Negative.   Allergic/Immunologic: Negative.   Neurological: Positive for headaches.  Hematological: Negative.   Psychiatric/Behavioral: Negative.     Social History   Tobacco Use  . Smoking status: Never Smoker  . Smokeless tobacco: Never Used  Substance Use Topics  . Alcohol use: No      Objective:   BP 128/76 (BP Location: Left Arm, Patient Position: Sitting, Cuff Size: Large)   Pulse 84 Comment: irregular  Temp 98.9 F (37.2 C)   Resp 20   Wt 238 lb (108 kg)   SpO2 94%   BMI 39.61 kg/m  Vitals:   04/17/18 1353  BP: 128/76  Pulse: 84   Resp: 20  Temp: 98.9 F (37.2 C)  SpO2: 94%  Weight: 238 lb (108 kg)     Physical Exam Vitals signs reviewed.  Constitutional:      Appearance: She is well-developed. She is obese.  HENT:     Head: Normocephalic and atraumatic.     Right Ear: External ear normal.     Left Ear: External ear normal.     Nose: Nose normal.  Eyes:     General: No scleral icterus.    Conjunctiva/sclera: Conjunctivae normal.  Neck:     Thyroid: No thyromegaly.  Cardiovascular:     Rate and Rhythm: Normal rate and regular rhythm.     Heart sounds: Normal heart sounds.  Pulmonary:  Effort: Pulmonary effort is normal.     Breath sounds: Normal breath sounds.  Abdominal:     Palpations: Abdomen is soft.  Musculoskeletal:     Comments: Trace edema.  Skin:    General: Skin is warm and dry.     Comments: Very fair skin.  Neurological:     General: No focal deficit present.     Mental Status: She is alert and oriented to person, place, and time.  Psychiatric:        Mood and Affect: Mood normal.        Behavior: Behavior normal.        Thought Content: Thought content normal.        Judgment: Judgment normal.         Assessment & Plan    1. Sinusitis, unspecified chronicity, unspecified location Patient to let us know if she feels worse. - amoxicillin-clavulanate (AUGMENTIN) 875-125 MG tablet; Take 1 tablet by mouth 2 (two) times daily.  Dispense: 20 tablet; Refill: 0  2. Mixed Ischemic/Nonischemic Cardiomyopathy   3. Chronic atrial fibrillation   4. Osteoarthritis, unspecified osteoarthritis type, unspecified site Plan to start to cut back on narcotics but attempt feels per prescription.  If patient says this is unacceptable  Will refer to pain clinic for this.    I have done the exam and reviewed the above chart and it is accurate to the best of my knowledge. Development worker, community has been used in this note in any air is in the dictation or transcription are unintentional.   Wilhemena Durie, MD  [place]

## 2018-04-22 NOTE — Telephone Encounter (Signed)
Please advise. Patient is scheduled for PT/INR visit tomorrow.

## 2018-04-22 NOTE — Telephone Encounter (Signed)
Pt came in on the 18th and seen Dr. Rosanna Randy.  She is still running a fever if she does not take tylenol.  She has been taking 5 tylenol at one time every 4 to 5 hrs... She is still having a slight headache and is weak.   She is scheduled for a pt tomorrow .  She does not know what to do whether to come back in and or does she need to do the Pt tomorrow,  CB#  [phone]  thanks teri

## 2018-04-24 ENCOUNTER — Ambulatory Visit: Payer: Self-pay

## 2018-04-24 NOTE — Telephone Encounter (Signed)
When is the time?

## 2018-04-24 NOTE — Telephone Encounter (Signed)
Patient is feeling slightly better. I spoke with her yesterday (04/23/2018). She rescheduled PT appt to 04/30/2018.

## 2018-04-30 ENCOUNTER — Ambulatory Visit: Payer: Self-pay

## 2018-04-30 ENCOUNTER — Ambulatory Visit
Admission: RE | Admit: 2018-04-30 | Discharge: 2018-04-30 | Disposition: A | Discharge status: Home / Self Care | Payer: Medicare Other | Attending: Family Medicine | Admitting: Family Medicine

## 2018-04-30 ENCOUNTER — Ambulatory Visit
Admission: RE | Admit: 2018-04-30 | Discharge: 2018-04-30 | Disposition: A | Discharge status: Home / Self Care | Payer: Medicare Other | Source: Ambulatory Visit | Attending: Family Medicine | Admitting: Family Medicine

## 2018-04-30 ENCOUNTER — Encounter: Payer: Self-pay | Admitting: Family Medicine

## 2018-04-30 ENCOUNTER — Other Ambulatory Visit: Payer: Self-pay

## 2018-04-30 ENCOUNTER — Ambulatory Visit (INDEPENDENT_AMBULATORY_CARE_PROVIDER_SITE_OTHER): Payer: Medicare Other | Admitting: Family Medicine

## 2018-04-30 VITALS — BP 124/64 | HR 91 | Temp 98.6°F | Ht 65.0 in | Wt 235.8 lb

## 2018-04-30 DIAGNOSIS — R059 Cough, unspecified: Secondary | ICD-10-CM

## 2018-04-30 DIAGNOSIS — R509 Fever, unspecified: Secondary | ICD-10-CM

## 2018-04-30 DIAGNOSIS — I509 Heart failure, unspecified: Secondary | ICD-10-CM

## 2018-04-30 DIAGNOSIS — R05 Cough: Secondary | ICD-10-CM

## 2018-04-30 DIAGNOSIS — I482 Chronic atrial fibrillation, unspecified: Secondary | ICD-10-CM

## 2018-04-30 DIAGNOSIS — Z6839 Body mass index (BMI) 39.0-39.9, adult: Secondary | ICD-10-CM

## 2018-04-30 LAB — POCT INR
INR: 1.5 — AB (ref 2.0–3.0)
Prothrombin Time: 18.5

## 2018-04-30 NOTE — Patient Instructions (Signed)
Take 3 mg coumadin everyday.  We will call you to schedule an appointment for follow up in 2 weeks.

## 2018-04-30 NOTE — Progress Notes (Signed)
Patient: Tracy Mann Female    DOB: 01/30/1934   85 y.o.   MRN: [id number] Visit Date: 04/30/2018  Today's Provider: Wilhemena Durie, MD   Chief Complaint  Patient presents with  . Fever    pt thinks she had the flu 2 weeks ago   Subjective:     HPI  Pt reports that she thinks she had the flu about 2 weeks ago 04/17/18.  She had body aches, fever up 102.4 with tylenol, chills, severe headache.  Pt started feeling better on Saturday and stopped tylenol.  Then Saturday evening she had to start back because fever went back up to 100.4 and had all the symptoms back again.  Today pt feels she is SOB and has issues trying to get her breathe when walking.  She thinks she is retaining fluid.   She has no CP or orthopnea or PND. No recent travel or exposure to Covid 19. Overall though she says she is feeling better today than she has.  She says her Tmax when she first got sick was 102. Recent Tmax gas been 100 and she has been afebrile for a few days.  Allergies  Allergen Reactions  . Other Anaphylaxis and Swelling    'tongue swelling'  . Sulfa Antibiotics Anaphylaxis  . Clarithromycin Other (See Comments)    Hives, headaches, hard time swelling, felt like throat was closing up Hives, headaches, hard time swelling, felt like throat was closing up  . Diphenhydramine Other (See Comments)  . Estrogens Conjugated   . Estrogens, Conjugated Other (See Comments)  . Sulfonamide Derivatives Swelling    'tongue swelling'  . Nitrofurantoin Nausea Only     Current Outpatient Medications:  .  ACCU-CHEK AVIVA PLUS test strip, Use with meter to check glucose before meals and at bedtime, Disp: 200 each, Rfl: 12 .  ezetimibe (ZETIA) 10 MG tablet, TAKE 1 TABLET BY MOUTH EVERY DAY, Disp: 90 tablet, Rfl: 3 .  fluticasone (FLONASE) 50 MCG/ACT nasal spray, SPRAY TWO SPRAYS IN EACH NOSTRIL ONCE DAILY, Disp: 16 g, Rfl: 11 .  furosemide (LASIX) 20 MG tablet, TAKE 1 TABLET (20 MG TOTAL) BY MOUTH 2  (TWO) TIMES DAILY AS NEEDED., Disp: 180 tablet, Rfl: 1 .  HYDROcodone-acetaminophen (NORCO) 10-325 MG tablet, Take 1 tablet by mouth every 6 (six) hours as needed., Disp: 120 tablet, Rfl: 0 .  insulin lispro (HUMALOG) 100 UNIT/ML KiwkPen, Inject 10-20 units three times daily with each meal., Disp: 15 mL, Rfl: 12 .  Insulin Pen Needle (RELION PEN NEEDLE 31G/8MM) 31G X 8 MM MISC, Use with insulin pen three times daily, Disp: 100 each, Rfl: 12 .  isosorbide mononitrate (IMDUR) 30 MG 24 hr tablet, Take 1 tablet (30 mg total) by mouth daily., Disp: 30 tablet, Rfl: 6 .  levothyroxine (SYNTHROID, LEVOTHROID) 125 MCG tablet, Take 1 tablet (125 mcg total) by mouth daily before breakfast., Disp: 30 tablet, Rfl: 12 .  lisinopril (PRINIVIL,ZESTRIL) 2.5 MG tablet, TAKE 1 TABLET BY MOUTH EVERYDAY AT BEDTIME, Disp: 90 tablet, Rfl: 3 .  LORazepam (ATIVAN) 1 MG tablet, TAKE 1 TABLET BY MOUTH TWICE A DAY FOR ANXIETY, Disp: 60 tablet, Rfl: 2 .  Magnesium 500 MG CAPS, Take 500 mg by mouth daily. , Disp: , Rfl:  .  nitrofurantoin, macrocrystal-monohydrate, (MACROBID) 100 MG capsule, TAKE 1 CAPSULE BY MOUTH EVERY DAY, Disp: 30 capsule, Rfl: 10 .  nitroGLYCERIN (NITROSTAT) 0.4 MG SL tablet, Place 1 tablet (0.4 mg total)  under the tongue every 5 (five) minutes as needed for chest pain., Disp: 25 tablet, Rfl: 3 .  rOPINIRole (REQUIP) 0.25 MG tablet, TAKE 1 TABLET BY MOUTH EVERYDAY AT BEDTIME, Disp: 90 tablet, Rfl: 1 .  vitamin B-12 (CYANOCOBALAMIN) 1000 MCG tablet, Take 1,000 mcg by mouth daily., Disp: , Rfl:  .  Vitamin D, Ergocalciferol, (DRISDOL) 50000 units CAPS capsule, Take 50,000 Units by mouth. , Disp: , Rfl:  .  warfarin (COUMADIN) 2.5 MG tablet, TAKE 1 TABLET BY MOUTH EVERY DAY, Disp: 30 tablet, Rfl: 1 .  amoxicillin-clavulanate (AUGMENTIN) 875-125 MG tablet, Take 1 tablet by mouth 2 (two) times daily. (Patient not taking: Reported on 04/30/2018), Disp: 20 tablet, Rfl: 0 .  Coenzyme Q10 (CO Q 10 PO), Take by mouth.,  Disp: , Rfl:  .  COMBIVENT RESPIMAT 20-100 MCG/ACT AERS respimat, 1 puff every 6 (six) hours as needed. , Disp: , Rfl:  .  diltiazem (CARDIZEM) 30 MG tablet, Take 1 tablet (30 mg total) by mouth 3 (three) times daily as needed., Disp: 90 tablet, Rfl: 3 .  famotidine (PEPCID) 40 MG tablet, TAKE 1 TABLET BY MOUTH EVERY DAY (Patient not taking: Reported on 04/30/2018), Disp: 30 tablet, Rfl: 11 .  HYDROcodone-acetaminophen (NORCO) 10-325 MG tablet, Take 1 tablet by mouth every 6 (six) hours as needed. (Patient not taking: Reported on 04/30/2018), Disp: 120 tablet, Rfl: 0 .  HYDROcodone-acetaminophen (NORCO) 10-325 MG tablet, Take 1 tablet by mouth every 6 (six) hours as needed. (Patient not taking: Reported on 04/30/2018), Disp: 120 tablet, Rfl: 0 .  metFORMIN (GLUCOPHAGE-XR) 500 MG 24 hr tablet, TAKE 2 TABLETS BY MOUTH EVERY DAY WITH BREAKFAST (Patient not taking: Reported on 04/10/2018), Disp: 60 tablet, Rfl: 11 .  warfarin (COUMADIN) 2 MG tablet, Take 1 tablet (2 mg total) by mouth daily. (Patient not taking: Reported on 03/19/2018), Disp: 30 tablet, Rfl: 3 .  warfarin (COUMADIN) 3 MG tablet, Take 1 tablet (3 mg total) by mouth daily. (Patient not taking: Reported on 03/19/2018), Disp: 30 tablet, Rfl: 12 .  warfarin (COUMADIN) 4 MG tablet, Take 1 tablet (4 mg total) by mouth daily. (Patient not taking: Reported on 03/19/2018), Disp: 30 tablet, Rfl: 12  Review of Systems  Constitutional: Positive for chills, fatigue and fever.  HENT: Positive for congestion (sometimes clear and other times yellow green mucus), ear pain and sinus pressure.   Eyes: Negative.   Respiratory: Positive for cough (not much maybe every two hours) and shortness of breath.   Cardiovascular: Negative.   Gastrointestinal: Negative.   Endocrine: Negative.   Genitourinary: Negative.   Musculoskeletal: Positive for arthralgias (body aches).  Skin: Negative.   Allergic/Immunologic: Negative.   Neurological: Positive for weakness and  headaches.  Hematological: Negative.   Psychiatric/Behavioral: Negative.     Social History   Tobacco Use  . Smoking status: Never Smoker  . Smokeless tobacco: Never Used  Substance Use Topics  . Alcohol use: No      Objective:   BP 124/64 (BP Location: Right Arm, Patient Position: Sitting, Cuff Size: Large)   Pulse 91   Temp 98.6 F (37 C) (Oral)   Ht 5\' 5"  (1.651 m)   Wt 235 lb 12.8 oz (107 kg)   SpO2 91%   BMI 39.24 kg/m  Vitals:   04/30/18 1021  BP: 124/64  Pulse: 91  Temp: 98.6 F (37 C)  TempSrc: Oral  SpO2: 91%  Weight: 235 lb 12.8 oz (107 kg)  Height: 5\' 5"  (1.651 m)  Pulse ox up to 93% on recheck.   Physical Exam Vitals signs reviewed.  Constitutional:      Appearance: She is well-developed. She is obese.  HENT:     Head: Normocephalic and atraumatic.     Right Ear: External ear normal.     Left Ear: External ear normal.     Nose: Nose normal.  Eyes:     General: No scleral icterus.    Conjunctiva/sclera: Conjunctivae normal.  Neck:     Thyroid: No thyromegaly.  Cardiovascular:     Rate and Rhythm: Normal rate and regular rhythm.     Heart sounds: Normal heart sounds.  Pulmonary:     Effort: Pulmonary effort is normal.     Breath sounds: Normal breath sounds.  Abdominal:     Palpations: Abdomen is soft.  Musculoskeletal:     Comments: 1+ LE  edema.  Skin:    General: Skin is warm and dry.     Comments: Very fair skin.  Significant AK's, especially arms and legs.  She has a  blackhead on her upper back  Neurological:     General: No focal deficit present.     Mental Status: She is alert and oriented to person, place, and time. Mental status is at baseline.  Psychiatric:        Mood and Affect: Mood normal.        Behavior: Behavior normal.        Thought Content: Thought content normal.        Judgment: Judgment normal.         Assessment & Plan    1. Chronic atrial fibrillation  - POCT INR  2. Cough I am concerned about  walking pneumonia or underlying CHF from stress of recent illness. - DG Chest 2 View; Future  3. Fever, unspecified fever cause Probably viral--I do not think this is Covid 19. If she warsens we will reevaluate. - DG Chest 2 View; Future - CBC with Differential/Platelet - Comprehensive metabolic panel  4. Congestive heart failure, unspecified HF chronicity, unspecified heart failure type ([place]) Double lasix--recheck next week. - DG Chest 2 View; Future - Pro b natriuretic peptide (BNP)  5. Class 2 severe obesity due to excess calories with serious comorbidity and body mass index (BMI) of 39.0 to 39.9 in adult [facility]) With DM,HTN,OA.      I have done the exam and reviewed the above chart and it is accurate to the best of my knowledge. Development worker, community has been used in this note in any air is in the dictation or transcription are unintentional.  Wilhemena Durie, MD  [place]

## 2018-05-01 LAB — CBC WITH DIFFERENTIAL/PLATELET
Basophils Absolute: 0.1 10*3/uL (ref 0.0–0.2)
Basos: 1 %
EOS (ABSOLUTE): 0.5 10*3/uL — AB (ref 0.0–0.4)
Eos: 4 %
Hematocrit: 36.2 % (ref 34.0–46.6)
Hemoglobin: 12.6 g/dL (ref 11.1–15.9)
Immature Grans (Abs): 0.1 10*3/uL (ref 0.0–0.1)
Immature Granulocytes: 1 %
Lymphocytes Absolute: 2.1 10*3/uL (ref 0.7–3.1)
Lymphs: 17 %
MCH: 30.6 pg (ref 26.6–33.0)
MCHC: 34.8 g/dL (ref 31.5–35.7)
MCV: 88 fL (ref 79–97)
Monocytes Absolute: 1 10*3/uL — ABNORMAL HIGH (ref 0.1–0.9)
Monocytes: 8 %
NEUTROS PCT: 69 %
Neutrophils Absolute: 8.6 10*3/uL — ABNORMAL HIGH (ref 1.4–7.0)
Platelets: 368 10*3/uL (ref 150–450)
RBC: 4.12 x10E6/uL (ref 3.77–5.28)
RDW: 12.3 % (ref 11.7–15.4)
WBC: 12.3 10*3/uL — ABNORMAL HIGH (ref 3.4–10.8)

## 2018-05-01 LAB — PRO B NATRIURETIC PEPTIDE: NT-Pro BNP: 531 pg/mL (ref 0–738)

## 2018-05-01 LAB — COMPREHENSIVE METABOLIC PANEL
ALT: 15 IU/L (ref 0–32)
AST: 19 IU/L (ref 0–40)
Albumin/Globulin Ratio: 1.2 (ref 1.2–2.2)
Albumin: 3.8 g/dL (ref 3.6–4.6)
Alkaline Phosphatase: 103 IU/L (ref 39–117)
BUN/Creatinine Ratio: 25 (ref 12–28)
BUN: 21 mg/dL (ref 8–27)
Bilirubin Total: 0.6 mg/dL (ref 0.0–1.2)
CO2: 23 mmol/L (ref 20–29)
CREATININE: 0.85 mg/dL (ref 0.57–1.00)
Calcium: 9.5 mg/dL (ref 8.7–10.3)
Chloride: 99 mmol/L (ref 96–106)
GFR calc Af Amer: 74 mL/min/{1.73_m2} (ref >59)
GFR calc non Af Amer: 64 mL/min/{1.73_m2} (ref >59)
Globulin, Total: 3.2 g/dL (ref 1.5–4.5)
Glucose: 171 mg/dL — ABNORMAL HIGH (ref 65–99)
POTASSIUM: 4.7 mmol/L (ref 3.5–5.2)
Sodium: 139 mmol/L (ref 134–144)
Total Protein: 7 g/dL (ref 6.0–8.5)

## 2018-05-07 ENCOUNTER — Telehealth: Payer: Self-pay | Admitting: Family Medicine

## 2018-05-07 NOTE — Telephone Encounter (Signed)
I think this is a Surveyor, quantity patient. Please advise.

## 2018-05-07 NOTE — Telephone Encounter (Signed)
Advised patient and daughter as well.

## 2018-05-07 NOTE — Telephone Encounter (Signed)
This is unlikely to be Covid 19 but if we might assume testing then when she arrives fully treat her as at risk--mask her and CMA bring her straight to room and I will wear PPE and do VS,etc.

## 2018-05-07 NOTE — Telephone Encounter (Signed)
Tracy Mann's daughter called saying they would like for her to have a Covid -19 test.  She has been having fever, and sinus issues going on for while.  She said Dr. Caryn Section has been treating her sinus infection.  She has just started coughing in the last few days.   She is suppose to come in the office tomorrow for a FU at 2:00.  Please call patient back [phone] or daughter at  [phone]   Thanks [name]

## 2018-05-08 ENCOUNTER — Other Ambulatory Visit: Payer: Self-pay

## 2018-05-08 ENCOUNTER — Ambulatory Visit (INDEPENDENT_AMBULATORY_CARE_PROVIDER_SITE_OTHER): Payer: Medicare Other | Admitting: Family Medicine

## 2018-05-08 ENCOUNTER — Ambulatory Visit: Payer: Self-pay

## 2018-05-08 DIAGNOSIS — R059 Cough, unspecified: Secondary | ICD-10-CM

## 2018-05-08 DIAGNOSIS — Z6839 Body mass index (BMI) 39.0-39.9, adult: Secondary | ICD-10-CM

## 2018-05-08 DIAGNOSIS — R05 Cough: Secondary | ICD-10-CM

## 2018-05-08 DIAGNOSIS — G4733 Obstructive sleep apnea (adult) (pediatric): Secondary | ICD-10-CM

## 2018-05-08 DIAGNOSIS — M1990 Unspecified osteoarthritis, unspecified site: Secondary | ICD-10-CM | POA: Diagnosis not present

## 2018-05-08 DIAGNOSIS — I482 Chronic atrial fibrillation, unspecified: Secondary | ICD-10-CM

## 2018-05-08 DIAGNOSIS — I25118 Atherosclerotic heart disease of native coronary artery with other forms of angina pectoris: Secondary | ICD-10-CM

## 2018-05-08 DIAGNOSIS — J4520 Mild intermittent asthma, uncomplicated: Secondary | ICD-10-CM

## 2018-05-08 DIAGNOSIS — Z794 Long term (current) use of insulin: Secondary | ICD-10-CM

## 2018-05-08 DIAGNOSIS — E1159 Type 2 diabetes mellitus with other circulatory complications: Secondary | ICD-10-CM

## 2018-05-08 DIAGNOSIS — F411 Generalized anxiety disorder: Secondary | ICD-10-CM

## 2018-05-08 LAB — POCT INR
INR: 2.1 (ref 2.0–3.0)
PT: 24.8

## 2018-05-08 NOTE — Progress Notes (Signed)
Patient: Tracy Mann Female    DOB: 06/15/1933   85 y.o.   MRN: [id number] Visit Date: 05/08/2018  Today's Provider: Wilhemena Durie, MD   No chief complaint on file.  Subjective:     HPI  Patient comes in today for follow-up follow-up of cough, sinusitis, fever.  She had his daughter who lives in [place] who wants her tested for COVID-19.  We did not have any test to administer but patient is feeling well.  He has no body aches no fevers no cough and is feeling overall much better than her last visit.  The only thing she has not got back her is her energy.  She is starting to walk some around the house again.  She is sleeping okay. She has slowly but surely improved since she presented with presumed sinusitis on 04/17/2018.  Again, she did not cough at all during the visit today.  Her breathing is good and she has no complaints really other than decreased energy as she recovers from all of this.  Lab Results  Component Value Date   INR 2.1 05/08/2018   INR 1.5 (A) 04/30/2018   INR 2.2 04/10/2018     Allergies  Allergen Reactions  . Other Anaphylaxis and Swelling    'tongue swelling'  . Sulfa Antibiotics Anaphylaxis  . Clarithromycin Other (See Comments)    Hives, headaches, hard time swelling, felt like throat was closing up Hives, headaches, hard time swelling, felt like throat was closing up  . Diphenhydramine Other (See Comments)  . Estrogens Conjugated   . Estrogens, Conjugated Other (See Comments)  . Sulfonamide Derivatives Swelling    'tongue swelling'  . Nitrofurantoin Nausea Only     Current Outpatient Medications:  .  ACCU-CHEK AVIVA PLUS test strip, Use with meter to check glucose before meals and at bedtime, Disp: 200 each, Rfl: 12 .  amoxicillin-clavulanate (AUGMENTIN) 875-125 MG tablet, Take 1 tablet by mouth 2 (two) times daily. (Patient not taking: Reported on 04/30/2018), Disp: 20 tablet, Rfl: 0 .  Coenzyme Q10 (CO Q 10 PO), Take by mouth., Disp:  , Rfl:  .  COMBIVENT RESPIMAT 20-100 MCG/ACT AERS respimat, 1 puff every 6 (six) hours as needed. , Disp: , Rfl:  .  diltiazem (CARDIZEM) 30 MG tablet, Take 1 tablet (30 mg total) by mouth 3 (three) times daily as needed., Disp: 90 tablet, Rfl: 3 .  ezetimibe (ZETIA) 10 MG tablet, TAKE 1 TABLET BY MOUTH EVERY DAY, Disp: 90 tablet, Rfl: 3 .  famotidine (PEPCID) 40 MG tablet, TAKE 1 TABLET BY MOUTH EVERY DAY (Patient not taking: Reported on 04/30/2018), Disp: 30 tablet, Rfl: 11 .  fluticasone (FLONASE) 50 MCG/ACT nasal spray, SPRAY TWO SPRAYS IN EACH NOSTRIL ONCE DAILY, Disp: 16 g, Rfl: 11 .  furosemide (LASIX) 20 MG tablet, TAKE 1 TABLET (20 MG TOTAL) BY MOUTH 2 (TWO) TIMES DAILY AS NEEDED., Disp: 180 tablet, Rfl: 1 .  HYDROcodone-acetaminophen (NORCO) 10-325 MG tablet, Take 1 tablet by mouth every 6 (six) hours as needed. (Patient not taking: Reported on 04/30/2018), Disp: 120 tablet, Rfl: 0 .  HYDROcodone-acetaminophen (NORCO) 10-325 MG tablet, Take 1 tablet by mouth every 6 (six) hours as needed., Disp: 120 tablet, Rfl: 0 .  HYDROcodone-acetaminophen (NORCO) 10-325 MG tablet, Take 1 tablet by mouth every 6 (six) hours as needed. (Patient not taking: Reported on 04/30/2018), Disp: 120 tablet, Rfl: 0 .  insulin lispro (HUMALOG) 100 UNIT/ML KiwkPen, Inject 10-20 units  three times daily with each meal., Disp: 15 mL, Rfl: 12 .  Insulin Pen Needle (RELION PEN NEEDLE 31G/8MM) 31G X 8 MM MISC, Use with insulin pen three times daily, Disp: 100 each, Rfl: 12 .  isosorbide mononitrate (IMDUR) 30 MG 24 hr tablet, Take 1 tablet (30 mg total) by mouth daily., Disp: 30 tablet, Rfl: 6 .  levothyroxine (SYNTHROID, LEVOTHROID) 125 MCG tablet, Take 1 tablet (125 mcg total) by mouth daily before breakfast., Disp: 30 tablet, Rfl: 12 .  lisinopril (PRINIVIL,ZESTRIL) 2.5 MG tablet, TAKE 1 TABLET BY MOUTH EVERYDAY AT BEDTIME, Disp: 90 tablet, Rfl: 3 .  LORazepam (ATIVAN) 1 MG tablet, TAKE 1 TABLET BY MOUTH TWICE A DAY FOR  ANXIETY, Disp: 60 tablet, Rfl: 2 .  Magnesium 500 MG CAPS, Take 500 mg by mouth daily. , Disp: , Rfl:  .  metFORMIN (GLUCOPHAGE-XR) 500 MG 24 hr tablet, TAKE 2 TABLETS BY MOUTH EVERY DAY WITH BREAKFAST (Patient not taking: Reported on 04/10/2018), Disp: 60 tablet, Rfl: 11 .  nitrofurantoin, macrocrystal-monohydrate, (MACROBID) 100 MG capsule, TAKE 1 CAPSULE BY MOUTH EVERY DAY, Disp: 30 capsule, Rfl: 10 .  nitroGLYCERIN (NITROSTAT) 0.4 MG SL tablet, Place 1 tablet (0.4 mg total) under the tongue every 5 (five) minutes as needed for chest pain., Disp: 25 tablet, Rfl: 3 .  rOPINIRole (REQUIP) 0.25 MG tablet, TAKE 1 TABLET BY MOUTH EVERYDAY AT BEDTIME, Disp: 90 tablet, Rfl: 1 .  vitamin B-12 (CYANOCOBALAMIN) 1000 MCG tablet, Take 1,000 mcg by mouth daily., Disp: , Rfl:  .  Vitamin D, Ergocalciferol, (DRISDOL) 50000 units CAPS capsule, Take 50,000 Units by mouth. , Disp: , Rfl:  .  warfarin (COUMADIN) 2 MG tablet, Take 1 tablet (2 mg total) by mouth daily. (Patient not taking: Reported on 03/19/2018), Disp: 30 tablet, Rfl: 3 .  warfarin (COUMADIN) 2.5 MG tablet, TAKE 1 TABLET BY MOUTH EVERY DAY, Disp: 30 tablet, Rfl: 1 .  warfarin (COUMADIN) 3 MG tablet, Take 1 tablet (3 mg total) by mouth daily. (Patient not taking: Reported on 03/19/2018), Disp: 30 tablet, Rfl: 12 .  warfarin (COUMADIN) 4 MG tablet, Take 1 tablet (4 mg total) by mouth daily. (Patient not taking: Reported on 03/19/2018), Disp: 30 tablet, Rfl: 12  Review of Systems  Constitutional: Positive for fatigue.  HENT: Negative.  Negative for sinus pain.   Eyes: Negative.   Respiratory: Negative.   Cardiovascular: Negative.   Endocrine: Negative.   Musculoskeletal: Positive for arthralgias and back pain.  Allergic/Immunologic: Negative.   Hematological: Negative.   Psychiatric/Behavioral: Negative.     Social History   Tobacco Use  . Smoking status: Never Smoker  . Smokeless tobacco: Never Used  Substance Use Topics  . Alcohol use: No       Objective:   There were no vitals taken for this visit. There were no vitals filed for this visit.   Physical Exam Vitals signs reviewed.  Constitutional:      Appearance: She is well-developed. She is obese.  HENT:     Head: Normocephalic and atraumatic.     Right Ear: External ear normal.     Left Ear: External ear normal.     Nose: Nose normal.  Eyes:     General: No scleral icterus.    Conjunctiva/sclera: Conjunctivae normal.  Neck:     Thyroid: No thyromegaly.  Cardiovascular:     Rate and Rhythm: Normal rate and regular rhythm.     Heart sounds: Normal heart sounds.  Pulmonary:  Effort: Pulmonary effort is normal.     Breath sounds: Normal breath sounds.  Abdominal:     Palpations: Abdomen is soft.  Musculoskeletal:     Comments: 1+ LE  edema.  Skin:    General: Skin is warm and dry.     Comments: Very fair skin.  Significant AK's, especially arms and legs.  She has a  blackhead on her upper back  Neurological:     General: No focal deficit present.     Mental Status: She is alert and oriented to person, place, and time. Mental status is at baseline.  Psychiatric:        Mood and Affect: Mood normal.        Behavior: Behavior normal.        Thought Content: Thought content normal.        Judgment: Judgment normal.   Weight is down to 230 pounds with diuresis. Temp is 98 Blood pressure is 148/90 Pulse ox is 94% INR/PT is 2.1      Assessment & Plan    1. Chronic atrial fibrillation Same dose of Coumadin.  INR in 1 month. - POCT INR  2. Obstructive sleep apnea   3. Mild intermittent asthma without complication Much improved.  Patient did not cough at all during 30-minute visit today.  4. Osteoarthritis, unspecified osteoarthritis type, unspecified site Patient claims chronic pain from arthritis and back problems  5. Atherosclerosis of native coronary artery of native heart with stable angina pectoris ([place])   6. Anxiety, generalized    7. Class 2 severe obesity due to excess calories with serious comorbidity and body mass index (BMI) of 39.0 to 39.9 in adult [facility]) With hypertension and diabetes and arthritis.  8. Type 2 diabetes mellitus with other circulatory complication, with long-term current use of insulin ([place]) Follow-up in July due to coronavirus pandemic  9. Cough Much improved.  No need for further testing at this time.  The patient is breathing really easily.  Normal lung sounds.    I have done the exam and reviewed the above chart and it is accurate to the best of my knowledge. Development worker, community has been used in this note in any air is in the dictation or transcription are unintentional.  Wilhemena Durie, MD  [facility]

## 2018-05-09 ENCOUNTER — Other Ambulatory Visit: Payer: Self-pay | Admitting: Family Medicine

## 2018-05-09 ENCOUNTER — Other Ambulatory Visit: Payer: Self-pay

## 2018-05-09 MED ORDER — FUROSEMIDE 20 MG PO TABS: 20.0000 mg | ORAL_TABLET | Freq: Two times a day (BID) | ORAL | 3 refills | Status: DC | PRN

## 2018-05-09 NOTE — Telephone Encounter (Signed)
Medication was sent into the pharmacy.  

## 2018-06-07 ENCOUNTER — Other Ambulatory Visit: Payer: Self-pay | Admitting: Family Medicine

## 2018-06-07 DIAGNOSIS — J309 Allergic rhinitis, unspecified: Secondary | ICD-10-CM

## 2018-06-09 ENCOUNTER — Other Ambulatory Visit: Payer: Self-pay | Admitting: Cardiovascular Disease

## 2018-06-13 ENCOUNTER — Ambulatory Visit (INDEPENDENT_AMBULATORY_CARE_PROVIDER_SITE_OTHER): Payer: Medicare Other | Admitting: Family Medicine

## 2018-06-13 ENCOUNTER — Other Ambulatory Visit: Payer: Self-pay

## 2018-06-13 ENCOUNTER — Encounter: Payer: Self-pay | Admitting: Family Medicine

## 2018-06-13 VITALS — BP 128/74 | HR 84 | Temp 98.6°F | Resp 16 | Ht 65.0 in | Wt 234.0 lb

## 2018-06-13 DIAGNOSIS — Z794 Long term (current) use of insulin: Secondary | ICD-10-CM

## 2018-06-13 DIAGNOSIS — M150 Primary generalized (osteo)arthritis: Secondary | ICD-10-CM

## 2018-06-13 DIAGNOSIS — I482 Chronic atrial fibrillation, unspecified: Secondary | ICD-10-CM

## 2018-06-13 DIAGNOSIS — E1159 Type 2 diabetes mellitus with other circulatory complications: Secondary | ICD-10-CM

## 2018-06-13 DIAGNOSIS — I509 Heart failure, unspecified: Secondary | ICD-10-CM

## 2018-06-13 DIAGNOSIS — M1990 Unspecified osteoarthritis, unspecified site: Secondary | ICD-10-CM

## 2018-06-13 DIAGNOSIS — Z6839 Body mass index (BMI) 39.0-39.9, adult: Secondary | ICD-10-CM

## 2018-06-13 DIAGNOSIS — G4733 Obstructive sleep apnea (adult) (pediatric): Secondary | ICD-10-CM

## 2018-06-13 DIAGNOSIS — M159 Polyosteoarthritis, unspecified: Secondary | ICD-10-CM

## 2018-06-13 LAB — POCT INR
INR: 2.8 (ref 2.0–3.0)
PT: 33.8

## 2018-06-13 MED ORDER — HYDROCODONE-ACETAMINOPHEN 10-325 MG PO TABS: 1.0000 | ORAL_TABLET | Freq: Four times a day (QID) | ORAL | 0 refills | Status: DC | PRN

## 2018-06-13 NOTE — Progress Notes (Signed)
Patient: Tracy Mann Female    DOB: [birth year]   85 y.o.   MRN: [id number] Visit Date: 06/13/2018  Today's Provider: Wilhemena Durie, MD   Chief Complaint  Patient presents with  . Follow-up   Subjective:     HPI Patient comes in today for a follow up. She was last seen in the office 1 month ago. She is also due for a PT/INR check. No medications were changed since her last visit.  She reports that she is feeling much better. She has not had any fevers or cough since last visit.  She is doing well with her present meds for chronic problems.   Lab Results  Component Value Date   INR 2.8 06/13/2018   INR 2.1 05/08/2018   INR 1.5 (A) 04/30/2018    BP Readings from Last 3 Encounters:  06/13/18 128/74  04/30/18 124/64  04/17/18 128/76   Wt Readings from Last 3 Encounters:  06/13/18 234 lb (106.1 kg)  04/30/18 235 lb 12.8 oz (107 kg)  04/17/18 238 lb (108 kg)    Allergies  Allergen Reactions  . Other Anaphylaxis and Swelling    'tongue swelling'  . Sulfa Antibiotics Anaphylaxis  . Clarithromycin Other (See Comments)    Hives, headaches, hard time swelling, felt like throat was closing up Hives, headaches, hard time swelling, felt like throat was closing up  . Diphenhydramine Other (See Comments)  . Estrogens Conjugated   . Estrogens, Conjugated Other (See Comments)  . Sulfonamide Derivatives Swelling    'tongue swelling'  . Nitrofurantoin Nausea Only     Current Outpatient Medications:  .  ACCU-CHEK AVIVA PLUS test strip, Use with meter to check glucose before meals and at bedtime, Disp: 200 each, Rfl: 12 .  Coenzyme Q10 (CO Q 10 PO), Take by mouth., Disp: , Rfl:  .  COMBIVENT RESPIMAT 20-100 MCG/ACT AERS respimat, 1 puff every 6 (six) hours as needed. , Disp: , Rfl:  .  diltiazem (CARDIZEM) 30 MG tablet, TAKE 1 TABLET (30 MG TOTAL) BY MOUTH 3 (THREE) TIMES DAILY AS NEEDED., Disp: 270 tablet, Rfl: 0 .  ezetimibe (ZETIA) 10 MG tablet, TAKE 1 TABLET BY  MOUTH EVERY DAY, Disp: 90 tablet, Rfl: 3 .  fluticasone (FLONASE) 50 MCG/ACT nasal spray, SPRAY TWO SPRAYS IN EACH NOSTRIL ONCE DAILY, Disp: 48 g, Rfl: 3 .  furosemide (LASIX) 20 MG tablet, Take 1 tablet (20 mg total) by mouth 2 (two) times daily as needed., Disp: 180 tablet, Rfl: 3 .  HYDROcodone-acetaminophen (NORCO) 10-325 MG tablet, Take 1 tablet by mouth every 6 (six) hours as needed., Disp: 120 tablet, Rfl: 0 .  HYDROcodone-acetaminophen (NORCO) 10-325 MG tablet, Take 1 tablet by mouth every 6 (six) hours as needed., Disp: 120 tablet, Rfl: 0 .  HYDROcodone-acetaminophen (NORCO) 10-325 MG tablet, Take 1 tablet by mouth every 6 (six) hours as needed., Disp: 120 tablet, Rfl: 0 .  insulin lispro (HUMALOG) 100 UNIT/ML KiwkPen, Inject 10-20 units three times daily with each meal., Disp: 15 mL, Rfl: 12 .  Insulin Pen Needle (RELION PEN NEEDLE 31G/8MM) 31G X 8 MM MISC, Use with insulin pen three times daily, Disp: 100 each, Rfl: 12 .  isosorbide mononitrate (IMDUR) 30 MG 24 hr tablet, Take 1 tablet (30 mg total) by mouth daily., Disp: 30 tablet, Rfl: 6 .  levothyroxine (SYNTHROID, LEVOTHROID) 125 MCG tablet, Take 1 tablet (125 mcg total) by mouth daily before breakfast., Disp: 30 tablet, Rfl: 12 .  lisinopril (PRINIVIL,ZESTRIL) 2.5 MG tablet, TAKE 1 TABLET BY MOUTH EVERYDAY AT BEDTIME, Disp: 90 tablet, Rfl: 3 .  LORazepam (ATIVAN) 1 MG tablet, TAKE 1 TABLET BY MOUTH TWICE A DAY FOR ANXIETY, Disp: 60 tablet, Rfl: 2 .  Magnesium 500 MG CAPS, Take 500 mg by mouth daily. , Disp: , Rfl:  .  nitrofurantoin, macrocrystal-monohydrate, (MACROBID) 100 MG capsule, TAKE 1 CAPSULE BY MOUTH EVERY DAY, Disp: 30 capsule, Rfl: 10 .  nitroGLYCERIN (NITROSTAT) 0.4 MG SL tablet, Place 1 tablet (0.4 mg total) under the tongue every 5 (five) minutes as needed for chest pain., Disp: 25 tablet, Rfl: 3 .  rOPINIRole (REQUIP) 0.25 MG tablet, TAKE 1 TABLET BY MOUTH EVERYDAY AT BEDTIME, Disp: 90 tablet, Rfl: 1 .  vitamin B-12  (CYANOCOBALAMIN) 1000 MCG tablet, Take 1,000 mcg by mouth daily., Disp: , Rfl:  .  Vitamin D, Ergocalciferol, (DRISDOL) 50000 units CAPS capsule, Take 50,000 Units by mouth. , Disp: , Rfl:  .  warfarin (COUMADIN) 2.5 MG tablet, TAKE 1 TABLET BY MOUTH EVERY DAY, Disp: 30 tablet, Rfl: 5 .  amoxicillin-clavulanate (AUGMENTIN) 875-125 MG tablet, Take 1 tablet by mouth 2 (two) times daily. (Patient not taking: Reported on 06/13/2018), Disp: 20 tablet, Rfl: 0 .  famotidine (PEPCID) 40 MG tablet, TAKE 1 TABLET BY MOUTH EVERY DAY (Patient not taking: Reported on 04/30/2018), Disp: 30 tablet, Rfl: 11 .  metFORMIN (GLUCOPHAGE-XR) 500 MG 24 hr tablet, TAKE 2 TABLETS BY MOUTH EVERY DAY WITH BREAKFAST (Patient not taking: Reported on 04/10/2018), Disp: 60 tablet, Rfl: 11 .  warfarin (COUMADIN) 2 MG tablet, Take 1 tablet (2 mg total) by mouth daily. (Patient not taking: Reported on 03/19/2018), Disp: 30 tablet, Rfl: 3 .  warfarin (COUMADIN) 3 MG tablet, Take 1 tablet (3 mg total) by mouth daily. (Patient not taking: Reported on 03/19/2018), Disp: 30 tablet, Rfl: 12 .  warfarin (COUMADIN) 4 MG tablet, Take 1 tablet (4 mg total) by mouth daily. (Patient not taking: Reported on 03/19/2018), Disp: 30 tablet, Rfl: 12  Review of Systems  Constitutional: Positive for activity change and fatigue. Negative for appetite change, chills, diaphoresis, fever and unexpected weight change.  HENT: Negative.   Respiratory: Negative for cough and shortness of breath.   Cardiovascular: Negative for chest pain, palpitations and leg swelling.  Gastrointestinal: Negative.   Endocrine: Negative.   Musculoskeletal: Positive for arthralgias, back pain and myalgias.  Allergic/Immunologic: Negative.   Neurological: Positive for weakness. Negative for dizziness, tremors, syncope, light-headedness and headaches.  Psychiatric/Behavioral: Positive for decreased concentration and sleep disturbance. Negative for agitation, confusion, self-injury and  suicidal ideas. The patient is nervous/anxious.     Social History   Tobacco Use  . Smoking status: Never Smoker  . Smokeless tobacco: Never Used  Substance Use Topics  . Alcohol use: No      Objective:   BP 128/74 (BP Location: Left Arm, Patient Position: Sitting, Cuff Size: Large)   Pulse 84   Temp 98.6 F (37 C)   Resp 16   Ht 5\' 5"  (1.651 m)   Wt 234 lb (106.1 kg)   SpO2 95%   BMI 38.94 kg/m  Vitals:   06/13/18 1406  BP: 128/74  Pulse: 84  Resp: 16  Temp: 98.6 F (37 C)  SpO2: 95%  Weight: 234 lb (106.1 kg)  Height: 5\' 5"  (1.651 m)     Physical Exam Vitals signs reviewed.  Constitutional:      Appearance: She is well-developed. She is obese.  HENT:     Head: Normocephalic and atraumatic.     Right Ear: External ear normal.     Left Ear: External ear normal.     Nose: Nose normal.  Eyes:     General: No scleral icterus.    Conjunctiva/sclera: Conjunctivae normal.  Neck:     Thyroid: No thyromegaly.  Cardiovascular:     Rate and Rhythm: Normal rate and regular rhythm.     Heart sounds: Normal heart sounds.  Pulmonary:     Effort: Pulmonary effort is normal.     Breath sounds: Normal breath sounds.  Abdominal:     Palpations: Abdomen is soft.  Musculoskeletal:     Comments: 1+ LE  edema.  Skin:    General: Skin is warm and dry.     Comments: Very fair skin.  Significant AK's, especially arms and legs.  She has a  blackhead on her upper back  Neurological:     General: No focal deficit present.     Mental Status: She is alert and oriented to person, place, and time. Mental status is at baseline.  Psychiatric:        Mood and Affect: Mood normal.        Behavior: Behavior normal.        Thought Content: Thought content normal.        Judgment: Judgment normal.         Assessment & Plan    1. Chronic atrial fibrillation  - POCT INR  2. Osteoarthritis, unspecified osteoarthritis type, unspecified site On chronic narcotics.  3.  Congestive heart failure, unspecified HF chronicity, unspecified heart failure type ([place])   4. Primary osteoarthritis involving multiple joints  - HYDROcodone-acetaminophen (NORCO) 10-325 MG tablet; Take 1 tablet by mouth every 6 (six) hours as needed.  Dispense: 120 tablet; Refill: 0  5. Obstructive sleep apnea   6. Type 2 diabetes mellitus with other circulatory complication, with long-term current use of insulin ([facility])   7. Class 2 severe obesity due to excess calories with serious comorbidity and body mass index (BMI) of 39.0 to 39.9 in adult [facility]) With CAD/OA.     Destinae Neubecker Cranford Mon, MD  [facility]

## 2018-06-27 ENCOUNTER — Other Ambulatory Visit: Payer: Self-pay | Admitting: Family Medicine

## 2018-06-28 NOTE — Telephone Encounter (Signed)
Please review

## 2018-07-02 ENCOUNTER — Other Ambulatory Visit: Payer: Self-pay | Admitting: Family Medicine

## 2018-07-04 ENCOUNTER — Telehealth: Payer: Self-pay | Admitting: Family Medicine

## 2018-07-04 NOTE — Telephone Encounter (Signed)
Opened in error

## 2018-07-09 ENCOUNTER — Other Ambulatory Visit: Payer: Self-pay

## 2018-07-09 ENCOUNTER — Ambulatory Visit (INDEPENDENT_AMBULATORY_CARE_PROVIDER_SITE_OTHER): Payer: Medicare Other

## 2018-07-09 DIAGNOSIS — I482 Chronic atrial fibrillation, unspecified: Secondary | ICD-10-CM

## 2018-07-09 LAB — POCT INR
Est. average glucose Bld gHb Est-mCnc: 22.6
INR: 1.9 — AB (ref 2.0–3.0)

## 2018-07-22 ENCOUNTER — Telehealth: Payer: Self-pay

## 2018-07-22 NOTE — Telephone Encounter (Signed)
Patient called requesting a refill on estradiol 0.1mg  vaginal cream. She reports that Dr. Jacqlyn Larsen originally prescribed this, but she has experienced vaginal irritation and dryness and this really helped. She reports that the one she has now is expired and if we could send this in for her. [facility]. Thanks!

## 2018-07-23 NOTE — Telephone Encounter (Signed)
Please review. Patient calling again. Thanks!

## 2018-07-23 NOTE — Telephone Encounter (Signed)
I cannot find this in her chart. When did he last see her ,what is it prescribed for and how much/how is it prescribed.?

## 2018-07-24 ENCOUNTER — Telehealth: Payer: Self-pay | Admitting: Family Medicine

## 2018-07-24 MED ORDER — ESTRADIOL 0.1 MG/GM VA CREA: 1.0000 | TOPICAL_CREAM | Freq: Every day | VAGINAL | 0 refills | Status: DC

## 2018-07-24 NOTE — Telephone Encounter (Signed)
Looks like she has not seen Dr. Jacqlyn Larsen since 2014. The last time this was prescribed was 04/29/2011. It was written as estrace 0.1mg /gm apply pea sized amount vaginally at bedtime.

## 2018-07-24 NOTE — Telephone Encounter (Signed)
Waiting on Dr. Alben Spittle response. Another message regarding this was already sent.

## 2018-07-24 NOTE — Telephone Encounter (Signed)
Pt calling back to check on the status of the cream she had requested. She is getting ready to go on vacation and would like it to take with her.  Please advise.  Thanks, [employer]

## 2018-07-24 NOTE — Telephone Encounter (Signed)
That is fine  thanks

## 2018-07-24 NOTE — Telephone Encounter (Signed)
Sent medication into the pharmacy.

## 2018-07-24 NOTE — Telephone Encounter (Signed)
Patient is calling back regarding previous message.

## 2018-07-30 ENCOUNTER — Other Ambulatory Visit: Payer: Self-pay

## 2018-07-30 ENCOUNTER — Ambulatory Visit (INDEPENDENT_AMBULATORY_CARE_PROVIDER_SITE_OTHER): Payer: Medicare Other | Admitting: Family Medicine

## 2018-07-30 ENCOUNTER — Encounter: Payer: Self-pay | Admitting: Family Medicine

## 2018-07-30 ENCOUNTER — Other Ambulatory Visit: Payer: Self-pay | Admitting: *Deleted

## 2018-07-30 ENCOUNTER — Ambulatory Visit: Payer: Self-pay

## 2018-07-30 VITALS — BP 133/82 | HR 99 | Temp 98.2°F | Resp 18 | Wt 240.8 lb

## 2018-07-30 DIAGNOSIS — L02419 Cutaneous abscess of limb, unspecified: Secondary | ICD-10-CM | POA: Diagnosis not present

## 2018-07-30 DIAGNOSIS — I4821 Permanent atrial fibrillation: Secondary | ICD-10-CM

## 2018-07-30 DIAGNOSIS — L03119 Cellulitis of unspecified part of limb: Secondary | ICD-10-CM

## 2018-07-30 LAB — POCT INR
INR: 1.7 — AB (ref 2.0–3.0)
Prothrombin Time: 20.6

## 2018-07-30 MED ORDER — AMOXICILLIN-POT CLAVULANATE 875-125 MG PO TABS: 1.0000 | ORAL_TABLET | Freq: Two times a day (BID) | ORAL | 0 refills | Status: DC

## 2018-07-30 MED ORDER — WARFARIN SODIUM 4 MG PO TABS: 4.0000 mg | ORAL_TABLET | Freq: Every day | ORAL | 12 refills | Status: DC

## 2018-07-30 NOTE — Progress Notes (Signed)
Patient: Tracy Mann Female    DOB: 08/16/1933   85 y.o.   MRN: [id number] Visit Date: 07/30/2018  Today's Provider: Wilhemena Durie, MD   Chief Complaint  Patient presents with  . Wound Check   Subjective:     Wound Check: Patient presents for wound check. Patient has a scrap/cutt  which is located on the right lower leg. Current symptoms: pain: moderate drainage:. Symptoms began 7 day ago. Pain is rated 7/10. Interventions to date: none. No fever or chills.  Allergies  Allergen Reactions  . Other Anaphylaxis and Swelling    'tongue swelling'  . Sulfa Antibiotics Anaphylaxis  . Clarithromycin Other (See Comments)    Hives, headaches, hard time swelling, felt like throat was closing up Hives, headaches, hard time swelling, felt like throat was closing up  . Diphenhydramine Other (See Comments)  . Estrogens Conjugated   . Estrogens, Conjugated Other (See Comments)  . Sulfonamide Derivatives Swelling    'tongue swelling'  . Nitrofurantoin Nausea Only     Current Outpatient Medications:  .  ACCU-CHEK AVIVA PLUS test strip, Use with meter to check glucose before meals and at bedtime, Disp: 200 each, Rfl: 12 .  Coenzyme Q10 (CO Q 10 PO), Take by mouth., Disp: , Rfl:  .  COMBIVENT RESPIMAT 20-100 MCG/ACT AERS respimat, 1 puff every 6 (six) hours as needed. , Disp: , Rfl:  .  diltiazem (CARDIZEM) 30 MG tablet, TAKE 1 TABLET (30 MG TOTAL) BY MOUTH 3 (THREE) TIMES DAILY AS NEEDED., Disp: 270 tablet, Rfl: 0 .  estradiol (ESTRACE VAGINAL) 0.1 MG/GM vaginal cream, Place 1 Applicatorful vaginally at bedtime., Disp: 42.5 g, Rfl: 0 .  ezetimibe (ZETIA) 10 MG tablet, TAKE 1 TABLET BY MOUTH EVERY DAY, Disp: 90 tablet, Rfl: 3 .  fluticasone (FLONASE) 50 MCG/ACT nasal spray, SPRAY TWO SPRAYS IN EACH NOSTRIL ONCE DAILY, Disp: 48 g, Rfl: 3 .  furosemide (LASIX) 20 MG tablet, Take 1 tablet (20 mg total) by mouth 2 (two) times daily as needed., Disp: 180 tablet, Rfl: 3 .   HYDROcodone-acetaminophen (NORCO) 10-325 MG tablet, Take 1 tablet by mouth every 6 (six) hours as needed., Disp: 120 tablet, Rfl: 0 .  HYDROcodone-acetaminophen (NORCO) 10-325 MG tablet, Take 1 tablet by mouth every 6 (six) hours as needed., Disp: 120 tablet, Rfl: 0 .  HYDROcodone-acetaminophen (NORCO) 10-325 MG tablet, Take 1 tablet by mouth every 6 (six) hours as needed., Disp: 120 tablet, Rfl: 0 .  insulin lispro (HUMALOG) 100 UNIT/ML KiwkPen, Inject 10-20 units three times daily with each meal., Disp: 15 mL, Rfl: 12 .  Insulin Pen Needle (RELION PEN NEEDLE 31G/8MM) 31G X 8 MM MISC, Use with insulin pen three times daily, Disp: 100 each, Rfl: 12 .  isosorbide mononitrate (IMDUR) 30 MG 24 hr tablet, Take 1 tablet (30 mg total) by mouth daily., Disp: 30 tablet, Rfl: 6 .  levothyroxine (SYNTHROID, LEVOTHROID) 125 MCG tablet, Take 1 tablet (125 mcg total) by mouth daily before breakfast., Disp: 30 tablet, Rfl: 12 .  lisinopril (PRINIVIL,ZESTRIL) 2.5 MG tablet, TAKE 1 TABLET BY MOUTH EVERYDAY AT BEDTIME, Disp: 90 tablet, Rfl: 3 .  LORazepam (ATIVAN) 1 MG tablet, TAKE 1 TABLET BY MOUTH TWICE A DAY FOR ANXIETY, Disp: 60 tablet, Rfl: 0 .  Magnesium 500 MG CAPS, Take 500 mg by mouth daily. , Disp: , Rfl:  .  metFORMIN (GLUCOPHAGE-XR) 500 MG 24 hr tablet, TAKE 2 TABLETS BY MOUTH EVERY DAY WITH  BREAKFAST, Disp: 60 tablet, Rfl: 11 .  nitrofurantoin, macrocrystal-monohydrate, (MACROBID) 100 MG capsule, TAKE 1 CAPSULE BY MOUTH EVERY DAY, Disp: 30 capsule, Rfl: 10 .  nitroGLYCERIN (NITROSTAT) 0.4 MG SL tablet, Place 1 tablet (0.4 mg total) under the tongue every 5 (five) minutes as needed for chest pain., Disp: 25 tablet, Rfl: 3 .  rOPINIRole (REQUIP) 0.25 MG tablet, TAKE 1 TABLET BY MOUTH EVERYDAY AT BEDTIME, Disp: 90 tablet, Rfl: 1 .  vitamin B-12 (CYANOCOBALAMIN) 1000 MCG tablet, Take 1,000 mcg by mouth daily., Disp: , Rfl:  .  Vitamin D, Ergocalciferol, (DRISDOL) 50000 units CAPS capsule, Take 50,000 Units by  mouth. , Disp: , Rfl:  .  warfarin (COUMADIN) 2 MG tablet, Take 1 tablet (2 mg total) by mouth daily., Disp: 30 tablet, Rfl: 3 .  warfarin (COUMADIN) 2.5 MG tablet, TAKE 1 TABLET BY MOUTH EVERY DAY, Disp: 30 tablet, Rfl: 5 .  warfarin (COUMADIN) 3 MG tablet, Take 1 tablet (3 mg total) by mouth daily., Disp: 30 tablet, Rfl: 12 .  warfarin (COUMADIN) 4 MG tablet, Take 1 tablet (4 mg total) by mouth daily., Disp: 30 tablet, Rfl: 12 .  amoxicillin-clavulanate (AUGMENTIN) 875-125 MG tablet, Take 1 tablet by mouth 2 (two) times daily. (Patient not taking: Reported on 06/13/2018), Disp: 20 tablet, Rfl: 0 .  amoxicillin-clavulanate (AUGMENTIN) 875-125 MG tablet, Take 1 tablet by mouth 2 (two) times daily., Disp: 20 tablet, Rfl: 0 .  famotidine (PEPCID) 40 MG tablet, TAKE 1 TABLET BY MOUTH EVERY DAY (Patient not taking: Reported on 04/30/2018), Disp: 30 tablet, Rfl: 11  Review of Systems  Constitutional: Negative.   HENT: Negative.   Eyes: Negative.   Gastrointestinal: Negative.   Genitourinary: Negative.   Musculoskeletal: Positive for arthralgias and joint swelling.  Skin: Positive for wound.  Allergic/Immunologic: Negative.   Neurological: Negative.   Psychiatric/Behavioral: Negative.     Social History   Tobacco Use  . Smoking status: Never Smoker  . Smokeless tobacco: Never Used  Substance Use Topics  . Alcohol use: No      Objective:   BP 133/82   Pulse 99   Temp 98.2 F (36.8 C) (Oral)   Resp 18   Wt 240 lb 12.8 oz (109.2 kg)   BMI 40.07 kg/m  Vitals:   07/30/18 0902  BP: 133/82  Pulse: 99  Resp: 18  Temp: 98.2 F (36.8 C)  TempSrc: Oral  Weight: 240 lb 12.8 oz (109.2 kg)     Physical Exam Vitals signs reviewed.  Constitutional:      Appearance: She is well-developed. She is obese.  HENT:     Head: Normocephalic and atraumatic.     Right Ear: External ear normal.     Left Ear: External ear normal.     Nose: Nose normal.  Eyes:     General: No scleral  icterus.    Conjunctiva/sclera: Conjunctivae normal.  Neck:     Thyroid: No thyromegaly.  Cardiovascular:     Rate and Rhythm: Normal rate and regular rhythm.     Heart sounds: Normal heart sounds.  Pulmonary:     Effort: Pulmonary effort is normal.     Breath sounds: Normal breath sounds.  Abdominal:     Palpations: Abdomen is soft.  Musculoskeletal:     Comments: 1+ LE  edema.  Skin:    General: Skin is warm and dry.     Comments: Celluitis with erythema and induration of right lower leg just above ankle area.  Neurological:     General: No focal deficit present.     Mental Status: She is alert and oriented to person, place, and time. Mental status is at baseline.  Psychiatric:        Mood and Affect: Mood normal.        Behavior: Behavior normal.        Thought Content: Thought content normal.        Judgment: Judgment normal.      Results for orders placed or performed in visit on 07/30/18  POCT INR  Result Value Ref Range   INR 1.7 (A) 2.0 - 3.0   Prothrombin Time 20.6        Assessment & Plan    1. Permanent atrial fibrillation  - POCT INR  2. Cellulitis  of leg Treat with Augmentin--RTC 48 hours.     Richard Cranford Mon, MD  [facility] Fritzi Mandes [place],acting as a scribe for Wilhemena Durie, MD.,have documented all relevant documentation on the behalf of Wilhemena Durie, MD,as directed by  Wilhemena Durie, MD while in the presence of Wilhemena Durie, MD.

## 2018-07-30 NOTE — Patient Instructions (Signed)
With coumadin alternate 3mg  and 4mg  ( Take 3 mg on even days and 4 mg on odd days)

## 2018-07-31 ENCOUNTER — Other Ambulatory Visit: Payer: Self-pay | Admitting: Family Medicine

## 2018-07-31 NOTE — Telephone Encounter (Signed)
Pt needing refills on: LORazepam (ATIVAN) 1 MG tablet  Please fill at: [id number] - [place], [place] - [address] [phone] (Phone) [phone] (Fax)    Thanks, [employer]

## 2018-08-01 ENCOUNTER — Ambulatory Visit: Payer: Self-pay | Admitting: Family Medicine

## 2018-08-01 MED ORDER — LORAZEPAM 1 MG PO TABS: ORAL_TABLET | ORAL | 1 refills | Status: DC

## 2018-08-06 ENCOUNTER — Other Ambulatory Visit: Payer: Self-pay

## 2018-08-06 MED ORDER — FLUCONAZOLE 100 MG PO TABS: 100.0000 mg | ORAL_TABLET | Freq: Every day | ORAL | 0 refills | Status: DC

## 2018-08-06 NOTE — Telephone Encounter (Signed)
Patient called saying that she has developed a yeast infection while taking Augmentin for cellulitis. Per Dr. Rosanna Randy, patient is to take this after she has completed abx. Medication was sent into the pharmacy.

## 2018-08-13 ENCOUNTER — Ambulatory Visit (INDEPENDENT_AMBULATORY_CARE_PROVIDER_SITE_OTHER): Payer: Medicare Other | Admitting: Family Medicine

## 2018-08-13 ENCOUNTER — Encounter: Payer: Self-pay | Admitting: Family Medicine

## 2018-08-13 ENCOUNTER — Other Ambulatory Visit: Payer: Self-pay

## 2018-08-13 VITALS — BP 118/69 | HR 96 | Temp 98.4°F | Resp 16 | Wt 235.8 lb

## 2018-08-13 DIAGNOSIS — I428 Other cardiomyopathies: Secondary | ICD-10-CM

## 2018-08-13 DIAGNOSIS — I25118 Atherosclerotic heart disease of native coronary artery with other forms of angina pectoris: Secondary | ICD-10-CM

## 2018-08-13 DIAGNOSIS — E1159 Type 2 diabetes mellitus with other circulatory complications: Secondary | ICD-10-CM | POA: Diagnosis not present

## 2018-08-13 DIAGNOSIS — I482 Chronic atrial fibrillation, unspecified: Secondary | ICD-10-CM | POA: Diagnosis not present

## 2018-08-13 DIAGNOSIS — G2581 Restless legs syndrome: Secondary | ICD-10-CM

## 2018-08-13 DIAGNOSIS — I4821 Permanent atrial fibrillation: Secondary | ICD-10-CM | POA: Diagnosis not present

## 2018-08-13 DIAGNOSIS — Z794 Long term (current) use of insulin: Secondary | ICD-10-CM

## 2018-08-13 LAB — POCT GLYCOSYLATED HEMOGLOBIN (HGB A1C): Hemoglobin A1C: 8.7 % — AB (ref 4.0–5.6)

## 2018-08-13 LAB — POCT INR
INR: 1.7 — AB (ref 2.0–3.0)
Prothrombin Time: 20.5

## 2018-08-13 MED ORDER — GABAPENTIN 100 MG PO CAPS: 100.0000 mg | ORAL_CAPSULE | Freq: Every day | ORAL | 3 refills | Status: DC

## 2018-08-13 NOTE — Patient Instructions (Signed)
Continue Coumadin at 4mg  daily

## 2018-08-13 NOTE — Progress Notes (Signed)
Patient: Tracy Mann Female    DOB: 06-21-1933   85 y.o.   MRN: [id number] Visit Date: 08/13/2018  Today's Provider: Wilhemena Durie, MD   Chief Complaint  Patient presents with  . Diabetes  . Atrial Fibrillation    PT/INR   Subjective:     HPI  Diabetes Mellitus Type II, Follow-up:   Lab Results  Component Value Date   HGBA1C 8.4 (A) 04/10/2018   HGBA1C 7.9 (A) 12/19/2017   HGBA1C 8.8 (H) 09/17/2017    Last seen for diabetes 3 months ago.  Management since then includes none. She reports good compliance with treatment. She is not having side effects.  Current symptoms include visual disturbances and have been unchanged. Home blood sugar records: 119-300  Episodes of hypoglycemia? no   Current insulin regiment: 3x daily, patient reports there are times she forgets to take insulin  Weight trend: stable  Current exercise: none Current diet habits: in general, a "healthy" diet    Pt c/o RLS symptoms and would like to retry ropinerole  Lower leg lesion improving with less tenderness and no present signs of infection. Pertinent Labs:    Component Value Date/Time   CHOL 210 (H) 09/17/2017 1130   TRIG 194 (H) 09/17/2017 1130   HDL 50 09/17/2017 1130   LDLCALC 121 (H) 09/17/2017 1130   CREATININE 0.85 04/30/2018 1058   CREATININE 0.94 01/23/2014 0406    Wt Readings from Last 3 Encounters:  08/13/18 235 lb 12.8 oz (107 kg)  07/30/18 240 lb 12.8 oz (109.2 kg)  06/13/18 234 lb (106.1 kg)    ------------------------------------------------------------------------  Allergies  Allergen Reactions  . Other Anaphylaxis and Swelling    'tongue swelling'  . Sulfa Antibiotics Anaphylaxis  . Clarithromycin Other (See Comments)    Hives, headaches, hard time swelling, felt like throat was closing up Hives, headaches, hard time swelling, felt like throat was closing up  . Diphenhydramine Other (See Comments)  . Estrogens Conjugated   . Estrogens,  Conjugated Other (See Comments)  . Sulfonamide Derivatives Swelling    'tongue swelling'  . Nitrofurantoin Nausea Only     Current Outpatient Medications:  .  ACCU-CHEK AVIVA PLUS test strip, Use with meter to check glucose before meals and at bedtime, Disp: 200 each, Rfl: 12 .  Coenzyme Q10 (CO Q 10 PO), Take by mouth., Disp: , Rfl:  .  COMBIVENT RESPIMAT 20-100 MCG/ACT AERS respimat, 1 puff every 6 (six) hours as needed. , Disp: , Rfl:  .  diltiazem (CARDIZEM) 30 MG tablet, TAKE 1 TABLET (30 MG TOTAL) BY MOUTH 3 (THREE) TIMES DAILY AS NEEDED., Disp: 270 tablet, Rfl: 0 .  estradiol (ESTRACE VAGINAL) 0.1 MG/GM vaginal cream, Place 1 Applicatorful vaginally at bedtime., Disp: 42.5 g, Rfl: 0 .  ezetimibe (ZETIA) 10 MG tablet, TAKE 1 TABLET BY MOUTH EVERY DAY, Disp: 90 tablet, Rfl: 3 .  fluconazole (DIFLUCAN) 100 MG tablet, Take 1 tablet (100 mg total) by mouth daily., Disp: 2 tablet, Rfl: 0 .  fluticasone (FLONASE) 50 MCG/ACT nasal spray, SPRAY TWO SPRAYS IN EACH NOSTRIL ONCE DAILY, Disp: 48 g, Rfl: 3 .  furosemide (LASIX) 20 MG tablet, Take 1 tablet (20 mg total) by mouth 2 (two) times daily as needed., Disp: 180 tablet, Rfl: 3 .  HYDROcodone-acetaminophen (NORCO) 10-325 MG tablet, Take 1 tablet by mouth every 6 (six) hours as needed., Disp: 120 tablet, Rfl: 0 .  HYDROcodone-acetaminophen (NORCO) 10-325 MG tablet, Take 1  tablet by mouth every 6 (six) hours as needed., Disp: 120 tablet, Rfl: 0 .  HYDROcodone-acetaminophen (NORCO) 10-325 MG tablet, Take 1 tablet by mouth every 6 (six) hours as needed., Disp: 120 tablet, Rfl: 0 .  insulin lispro (HUMALOG) 100 UNIT/ML KiwkPen, Inject 10-20 units three times daily with each meal., Disp: 15 mL, Rfl: 12 .  Insulin Pen Needle (RELION PEN NEEDLE 31G/8MM) 31G X 8 MM MISC, Use with insulin pen three times daily, Disp: 100 each, Rfl: 12 .  isosorbide mononitrate (IMDUR) 30 MG 24 hr tablet, Take 1 tablet (30 mg total) by mouth daily., Disp: 30 tablet, Rfl: 6  .  levothyroxine (SYNTHROID, LEVOTHROID) 125 MCG tablet, Take 1 tablet (125 mcg total) by mouth daily before breakfast., Disp: 30 tablet, Rfl: 12 .  lisinopril (PRINIVIL,ZESTRIL) 2.5 MG tablet, TAKE 1 TABLET BY MOUTH EVERYDAY AT BEDTIME, Disp: 90 tablet, Rfl: 3 .  LORazepam (ATIVAN) 1 MG tablet, BID prn, Disp: 60 tablet, Rfl: 1 .  Magnesium 500 MG CAPS, Take 500 mg by mouth daily. , Disp: , Rfl:  .  metFORMIN (GLUCOPHAGE-XR) 500 MG 24 hr tablet, TAKE 2 TABLETS BY MOUTH EVERY DAY WITH BREAKFAST, Disp: 60 tablet, Rfl: 11 .  nitrofurantoin, macrocrystal-monohydrate, (MACROBID) 100 MG capsule, TAKE 1 CAPSULE BY MOUTH EVERY DAY, Disp: 30 capsule, Rfl: 10 .  nitroGLYCERIN (NITROSTAT) 0.4 MG SL tablet, Place 1 tablet (0.4 mg total) under the tongue every 5 (five) minutes as needed for chest pain., Disp: 25 tablet, Rfl: 3 .  rOPINIRole (REQUIP) 0.25 MG tablet, TAKE 1 TABLET BY MOUTH EVERYDAY AT BEDTIME, Disp: 90 tablet, Rfl: 1 .  vitamin B-12 (CYANOCOBALAMIN) 1000 MCG tablet, Take 1,000 mcg by mouth daily., Disp: , Rfl:  .  Vitamin D, Ergocalciferol, (DRISDOL) 50000 units CAPS capsule, Take 50,000 Units by mouth. , Disp: , Rfl:  .  warfarin (COUMADIN) 2 MG tablet, Take 1 tablet (2 mg total) by mouth daily., Disp: 30 tablet, Rfl: 3 .  warfarin (COUMADIN) 2.5 MG tablet, TAKE 1 TABLET BY MOUTH EVERY DAY, Disp: 30 tablet, Rfl: 5 .  warfarin (COUMADIN) 3 MG tablet, Take 1 tablet (3 mg total) by mouth daily., Disp: 30 tablet, Rfl: 12 .  warfarin (COUMADIN) 4 MG tablet, Take 1 tablet (4 mg total) by mouth daily., Disp: 30 tablet, Rfl: 12 .  famotidine (PEPCID) 40 MG tablet, TAKE 1 TABLET BY MOUTH EVERY DAY (Patient not taking: Reported on 04/30/2018), Disp: 30 tablet, Rfl: 11  Review of Systems  Constitutional: Negative.   HENT: Negative.   Eyes: Negative.   Respiratory: Negative.   Cardiovascular: Negative.   Gastrointestinal: Negative.   Endocrine: Negative.   Musculoskeletal: Positive for arthralgias.   Allergic/Immunologic: Negative.   Neurological: Positive for numbness.  Psychiatric/Behavioral: Negative.     Social History   Tobacco Use  . Smoking status: Never Smoker  . Smokeless tobacco: Never Used  Substance Use Topics  . Alcohol use: No      Objective:   BP 118/69   Pulse 96   Temp 98.4 F (36.9 C) (Oral)   Resp 16   Wt 235 lb 12.8 oz (107 kg)   BMI 39.24 kg/m  Vitals:   08/13/18 1404  BP: 118/69  Pulse: 96  Resp: 16  Temp: 98.4 F (36.9 C)  TempSrc: Oral  Weight: 235 lb 12.8 oz (107 kg)     Physical Exam Vitals signs reviewed.  Constitutional:      Appearance: She is well-developed. She is obese.  HENT:     Head: Normocephalic and atraumatic.     Right Ear: External ear normal.     Left Ear: External ear normal.     Nose: Nose normal.  Eyes:     General: No scleral icterus.    Conjunctiva/sclera: Conjunctivae normal.  Neck:     Thyroid: No thyromegaly.  Cardiovascular:     Rate and Rhythm: Normal rate and regular rhythm.     Heart sounds: Normal heart sounds.  Pulmonary:     Effort: Pulmonary effort is normal.     Breath sounds: Normal breath sounds.  Abdominal:     Palpations: Abdomen is soft.  Musculoskeletal:     Comments: 1+ LE  edema.  Skin:    General: Skin is warm and dry.     Comments: Very fair skin.  Significant AK's, especially arms and legs.  Large abrasion of lower leg much improved.  Neurological:     General: No focal deficit present.     Mental Status: She is alert and oriented to person, place, and time. Mental status is at baseline.  Psychiatric:        Mood and Affect: Mood normal.        Behavior: Behavior normal.        Thought Content: Thought content normal.        Judgment: Judgment normal.      Results for orders placed or performed in visit on 08/13/18  POCT INR  Result Value Ref Range   INR 1.7 (A) 2.0 - 3.0   Prothrombin Time 20.5        Assessment & Plan    1. Permanent atrial fibrillation On  coumadin.  2. Type 2 diabetes mellitus with other circulatory complication, with long-term current use of insulin (HCC) Pt has not been working on habits.More than 50% 25 minute visit spent in counseling or coordination of care  - POCT glycosylated hemoglobin (Hb A1C)--8.7==was 8.4  3. Chronic atrial fibrillation  - POCT INR  4. Restless leg syndrome Ropinerole interacts with coumadin--try gabapentin. - gabapentin (NEURONTIN) 100 MG capsule; Take 1 capsule (100 mg total) by mouth at bedtime.  Dispense: 90 capsule; Refill: 3  5. Mixed Ischemic/Nonischemic Cardiomyopathy   6. Atherosclerosis of native coronary artery of native heart with stable angina pectoris ([place])  I have done the exam and reviewed the chart and it is accurate to the best of my knowledge. Development worker, community has been used and  any errors in dictation or transcription are unintentional. Miguel Aschoff M.D. [place], MD  [facility]

## 2018-08-22 ENCOUNTER — Encounter: Payer: Self-pay | Admitting: Family Medicine

## 2018-08-22 ENCOUNTER — Ambulatory Visit (INDEPENDENT_AMBULATORY_CARE_PROVIDER_SITE_OTHER): Payer: Medicare Other | Admitting: Family Medicine

## 2018-08-22 ENCOUNTER — Other Ambulatory Visit: Payer: Self-pay

## 2018-08-22 VITALS — BP 126/74 | HR 72 | Temp 98.4°F | Resp 20 | Wt 236.0 lb

## 2018-08-22 DIAGNOSIS — R079 Chest pain, unspecified: Secondary | ICD-10-CM | POA: Diagnosis not present

## 2018-08-22 DIAGNOSIS — L03119 Cellulitis of unspecified part of limb: Secondary | ICD-10-CM | POA: Diagnosis not present

## 2018-08-22 DIAGNOSIS — G2581 Restless legs syndrome: Secondary | ICD-10-CM

## 2018-08-22 DIAGNOSIS — I482 Chronic atrial fibrillation, unspecified: Secondary | ICD-10-CM

## 2018-08-22 DIAGNOSIS — L02419 Cutaneous abscess of limb, unspecified: Secondary | ICD-10-CM

## 2018-08-22 LAB — POCT INR
INR: 2.2 (ref 2.0–3.0)
PT: 26.2

## 2018-08-22 NOTE — Progress Notes (Signed)
Patient: Tracy Mann Female    DOB: [birth year]   85 y.o.   MRN: [id number] Visit Date: 08/22/2018  Today's Provider: Wilhemena Durie, MD   Chief Complaint  Patient presents with  . Follow-up  . Cellulitis  . Atrial Fibrillation   Subjective:    HPI Patient comes in today for a follow up. She was last seen in the office on 08/13/2018. She reports that the cellulitis on her right lower leg has not changed much since last week. She still has tenderness and redness.  She also c/o chest pain for a few minutes 2 days ago and recent DOE that seems to be worse.No PND or orthopnea. No syncope or presyncope. BP Readings from Last 3 Encounters:  08/22/18 126/74  08/13/18 118/69  07/30/18 133/82   Wt Readings from Last 3 Encounters:  08/22/18 236 lb (107 kg)  08/13/18 235 lb 12.8 oz (107 kg)  07/30/18 240 lb 12.8 oz (109.2 kg)     She is also due for a repeat PT/INR check. She was advised to start Coumadin 4mg  daily, and she has tolerated medication changes well.  Lab Results  Component Value Date   INR 2.2 08/22/2018   INR 1.7 (A) 08/13/2018   INR 1.7 (A) 07/30/2018   On last visit she c/o restless legs symptoms and she was prescribed gabapentin 100mg  daily. She reports that she has not started the medication yet.   Allergies  Allergen Reactions  . Other Anaphylaxis and Swelling    'tongue swelling'  . Sulfa Antibiotics Anaphylaxis  . Clarithromycin Other (See Comments)    Hives, headaches, hard time swelling, felt like throat was closing up Hives, headaches, hard time swelling, felt like throat was closing up  . Diphenhydramine Other (See Comments)  . Estrogens Conjugated   . Estrogens, Conjugated Other (See Comments)  . Sulfonamide Derivatives Swelling    'tongue swelling'  . Nitrofurantoin Nausea Only     Current Outpatient Medications:  .  ACCU-CHEK AVIVA PLUS test strip, Use with meter to check glucose before meals and at bedtime, Disp: 200 each, Rfl: 12  .  Coenzyme Q10 (CO Q 10 PO), Take by mouth., Disp: , Rfl:  .  COMBIVENT RESPIMAT 20-100 MCG/ACT AERS respimat, 1 puff every 6 (six) hours as needed. , Disp: , Rfl:  .  diltiazem (CARDIZEM) 30 MG tablet, TAKE 1 TABLET (30 MG TOTAL) BY MOUTH 3 (THREE) TIMES DAILY AS NEEDED., Disp: 270 tablet, Rfl: 0 .  estradiol (ESTRACE VAGINAL) 0.1 MG/GM vaginal cream, Place 1 Applicatorful vaginally at bedtime., Disp: 42.5 g, Rfl: 0 .  ezetimibe (ZETIA) 10 MG tablet, TAKE 1 TABLET BY MOUTH EVERY DAY, Disp: 90 tablet, Rfl: 3 .  fluticasone (FLONASE) 50 MCG/ACT nasal spray, SPRAY TWO SPRAYS IN EACH NOSTRIL ONCE DAILY, Disp: 48 g, Rfl: 3 .  furosemide (LASIX) 20 MG tablet, Take 1 tablet (20 mg total) by mouth 2 (two) times daily as needed., Disp: 180 tablet, Rfl: 3 .  gabapentin (NEURONTIN) 100 MG capsule, Take 1 capsule (100 mg total) by mouth at bedtime., Disp: 90 capsule, Rfl: 3 .  HYDROcodone-acetaminophen (NORCO) 10-325 MG tablet, Take 1 tablet by mouth every 6 (six) hours as needed., Disp: 120 tablet, Rfl: 0 .  HYDROcodone-acetaminophen (NORCO) 10-325 MG tablet, Take 1 tablet by mouth every 6 (six) hours as needed., Disp: 120 tablet, Rfl: 0 .  HYDROcodone-acetaminophen (NORCO) 10-325 MG tablet, Take 1 tablet by mouth every 6 (six) hours  as needed., Disp: 120 tablet, Rfl: 0 .  insulin lispro (HUMALOG) 100 UNIT/ML KiwkPen, Inject 10-20 units three times daily with each meal., Disp: 15 mL, Rfl: 12 .  Insulin Pen Needle (RELION PEN NEEDLE 31G/8MM) 31G X 8 MM MISC, Use with insulin pen three times daily, Disp: 100 each, Rfl: 12 .  isosorbide mononitrate (IMDUR) 30 MG 24 hr tablet, Take 1 tablet (30 mg total) by mouth daily., Disp: 30 tablet, Rfl: 6 .  levothyroxine (SYNTHROID, LEVOTHROID) 125 MCG tablet, Take 1 tablet (125 mcg total) by mouth daily before breakfast., Disp: 30 tablet, Rfl: 12 .  lisinopril (PRINIVIL,ZESTRIL) 2.5 MG tablet, TAKE 1 TABLET BY MOUTH EVERYDAY AT BEDTIME, Disp: 90 tablet, Rfl: 3 .   LORazepam (ATIVAN) 1 MG tablet, BID prn, Disp: 60 tablet, Rfl: 1 .  Magnesium 500 MG CAPS, Take 500 mg by mouth daily. , Disp: , Rfl:  .  metFORMIN (GLUCOPHAGE-XR) 500 MG 24 hr tablet, TAKE 2 TABLETS BY MOUTH EVERY DAY WITH BREAKFAST, Disp: 60 tablet, Rfl: 11 .  nitroGLYCERIN (NITROSTAT) 0.4 MG SL tablet, Place 1 tablet (0.4 mg total) under the tongue every 5 (five) minutes as needed for chest pain., Disp: 25 tablet, Rfl: 3 .  vitamin B-12 (CYANOCOBALAMIN) 1000 MCG tablet, Take 1,000 mcg by mouth daily., Disp: , Rfl:  .  Vitamin D, Ergocalciferol, (DRISDOL) 50000 units CAPS capsule, Take 50,000 Units by mouth. , Disp: , Rfl:  .  warfarin (COUMADIN) 2 MG tablet, Take 1 tablet (2 mg total) by mouth daily., Disp: 30 tablet, Rfl: 3 .  warfarin (COUMADIN) 2.5 MG tablet, TAKE 1 TABLET BY MOUTH EVERY DAY, Disp: 30 tablet, Rfl: 5 .  warfarin (COUMADIN) 3 MG tablet, Take 1 tablet (3 mg total) by mouth daily., Disp: 30 tablet, Rfl: 12 .  warfarin (COUMADIN) 4 MG tablet, Take 1 tablet (4 mg total) by mouth daily., Disp: 30 tablet, Rfl: 12 .  famotidine (PEPCID) 40 MG tablet, TAKE 1 TABLET BY MOUTH EVERY DAY (Patient not taking: Reported on 04/30/2018), Disp: 30 tablet, Rfl: 11 .  fluconazole (DIFLUCAN) 100 MG tablet, Take 1 tablet (100 mg total) by mouth daily. (Patient not taking: Reported on 08/22/2018), Disp: 2 tablet, Rfl: 0 .  nitrofurantoin, macrocrystal-monohydrate, (MACROBID) 100 MG capsule, TAKE 1 CAPSULE BY MOUTH EVERY DAY (Patient not taking: Reported on 08/22/2018), Disp: 30 capsule, Rfl: 10  Review of Systems  Constitutional: Positive for fatigue. Negative for activity change.  Eyes: Negative.   Respiratory:       Mild DOE.  Cardiovascular: Positive for leg swelling. Negative for chest pain and palpitations.  Endocrine: Negative.   Musculoskeletal: Positive for myalgias. Negative for arthralgias.  Skin: Positive for color change, rash and wound.  Allergic/Immunologic: Negative for environmental  allergies.  Neurological: Negative for dizziness, light-headedness and headaches.  Hematological: Negative for adenopathy. Does not bruise/bleed easily.  Psychiatric/Behavioral: Negative for decreased concentration, self-injury, sleep disturbance and suicidal ideas. The patient is not nervous/anxious.     Social History   Tobacco Use  . Smoking status: Never Smoker  . Smokeless tobacco: Never Used  Substance Use Topics  . Alcohol use: No      Objective:   BP 126/74   Pulse 72   Temp 98.4 F (36.9 C)   Resp 20   Wt 236 lb (107 kg)   SpO2 95%   BMI 39.27 kg/m  Vitals:   08/22/18 1407  BP: 126/74  Pulse: 72  Resp: 20  Temp: 98.4 F (36.9  C)  SpO2: 95%  Weight: 236 lb (107 kg)     Physical Exam Vitals signs reviewed.  Constitutional:      Appearance: She is well-developed. She is obese.  HENT:     Head: Normocephalic and atraumatic.     Right Ear: External ear normal.     Left Ear: External ear normal.     Nose: Nose normal.  Eyes:     General: No scleral icterus.    Conjunctiva/sclera: Conjunctivae normal.  Neck:     Thyroid: No thyromegaly.  Cardiovascular:     Rate and Rhythm: Normal rate and regular rhythm.     Heart sounds: Normal heart sounds.  Pulmonary:     Effort: Pulmonary effort is normal.     Breath sounds: Normal breath sounds.  Abdominal:     Palpations: Abdomen is soft.  Musculoskeletal:     Comments: 1+ LE  edema.  Skin:    General: Skin is warm and dry.     Comments: Very fair skin.  Significant AK's, especially arms and legs.  Large abrasion of lower leg much improved.  Neurological:     General: No focal deficit present.     Mental Status: She is alert and oriented to person, place, and time. Mental status is at baseline.  Psychiatric:        Mood and Affect: Mood normal.        Behavior: Behavior normal.        Thought Content: Thought content normal.        Judgment: Judgment normal.      Results for orders placed or  performed in visit on 08/22/18  POCT INR  Result Value Ref Range   INR 2.2 2.0 - 3.0   PT 26.2    ECG--Afib with rate of 90 with nonspecific lateral STTWCs.    Assessment & Plan    1. Cellulitis and abscess of leg Much improved. Bacitracin only.   2. Chronic atrial fibrillation  - POCT INR  3. Restless leg syndrome   4. Chest pain, unspecified type Refer to Cardiology but symptoms very vague.More than 50% 25 minute visit spent in counseling or coordination of care  - EKG 12-Lead - Ambulatory referral to Cardiology     Wilhemena Durie, MD  [facility]

## 2018-08-26 ENCOUNTER — Telehealth: Payer: Self-pay | Admitting: Family Medicine

## 2018-08-26 NOTE — Telephone Encounter (Signed)
Pt stated that she has cellulitis and it is very painful. Pt stated that when she saw Dr. Rosanna Randy last week he discuss sending an Rx for pain cream. Pt is requesting that an Rx be sent to [facility] to help with pain. Please advise. Thanks TNP

## 2018-08-26 NOTE — Telephone Encounter (Signed)
There is no pain cream for that. I saw in pharmacy recently some OTC Lidocaine cream--that might help.

## 2018-08-26 NOTE — Telephone Encounter (Signed)
Please review. Thanks!  

## 2018-08-27 NOTE — Telephone Encounter (Signed)
[id number] 

## 2018-08-28 ENCOUNTER — Telehealth: Payer: Self-pay

## 2018-08-28 NOTE — Telephone Encounter (Signed)
Tried to contact patient. No answer, unable to leave voicemail message.

## 2018-08-28 NOTE — Telephone Encounter (Signed)

## 2018-08-29 ENCOUNTER — Other Ambulatory Visit: Payer: Self-pay

## 2018-08-29 ENCOUNTER — Ambulatory Visit (INDEPENDENT_AMBULATORY_CARE_PROVIDER_SITE_OTHER): Payer: Medicare Other

## 2018-08-29 ENCOUNTER — Ambulatory Visit (INDEPENDENT_AMBULATORY_CARE_PROVIDER_SITE_OTHER): Payer: Medicare Other | Admitting: Family

## 2018-08-29 ENCOUNTER — Encounter: Payer: Self-pay | Admitting: Physician Assistant

## 2018-08-29 VITALS — BP 158/70 | HR 94 | Ht 60.0 in | Wt 235.5 lb

## 2018-08-29 DIAGNOSIS — R5383 Other fatigue: Secondary | ICD-10-CM

## 2018-08-29 DIAGNOSIS — I5042 Chronic combined systolic (congestive) and diastolic (congestive) heart failure: Secondary | ICD-10-CM | POA: Diagnosis not present

## 2018-08-29 DIAGNOSIS — I482 Chronic atrial fibrillation, unspecified: Secondary | ICD-10-CM | POA: Diagnosis not present

## 2018-08-29 DIAGNOSIS — R0609 Other forms of dyspnea: Secondary | ICD-10-CM

## 2018-08-29 DIAGNOSIS — I25118 Atherosclerotic heart disease of native coronary artery with other forms of angina pectoris: Secondary | ICD-10-CM

## 2018-08-29 NOTE — Patient Instructions (Signed)
Medication Instructions:  - Your physician recommends that you continue on your current medications as directed. Please refer to the Current Medication list given to you today.  If you need a refill on your cardiac medications before your next appointment, please call your pharmacy.   Lab work: - Your physician recommends that you have lab work today: TSH/ CMET/ CBC/ Magnesium  If you have labs (blood work) drawn today and your tests are completely normal, you will receive your results only by: Marland Kitchen MyChart Message (if you have MyChart) OR . A paper copy in the mail If you have any lab test that is abnormal or we need to change your treatment, we will call you to review the results.  Testing/Procedures: - Your physician has recommended that you wear a Zio monitor (14-day heart monitor). This monitor is a medical device that records the heart's electrical activity. Doctors most often use these monitors to diagnose arrhythmias. Arrhythmias are problems with the speed or rhythm of the heartbeat. The monitor is a small device applied to your chest. You can wear one while you do your normal daily activities. While wearing this monitor if you have any symptoms to push the button and record what you felt. Once you have worn this monitor for the period of time provider prescribed (Usually 14 days), you will return the monitor device in the postage paid box. Once it is returned they will download the data collected and provide [place] with a report which the provider will then review and we will call you with those results. Important tips:  1. Avoid showering during the first 24 hours of wearing the monitor. 2. Avoid excessive sweating to help maximize wear time. 3. Do not submerge the device, no hot tubs, and no swimming pools. 4. Keep any lotions or oils away from the patch. 5. After 24 hours you may shower with the patch on. Take brief showers with your back facing the shower head.  6. Do not remove patch  once it has been placed because that will interrupt data and decrease adhesive wear time. 7. Push the button when you have any symptoms and write down what you were feeling. 8. Once you have completed wearing your monitor, remove and place into box which has postage paid and place in your outgoing mailbox.  9. If for some reason you have misplaced your box then call our office and we can provide another box and/or mail it off for you.        Follow-Up: At [facility], you and your health needs are our priority.  As part of our continuing mission to provide you with exceptional heart care, we have created designated Provider Care Teams.  These Care Teams include your primary Cardiologist (physician) and Advanced Practice Providers (APPs -  Physician Assistants and Nurse Practitioners) who all work together to provide you with the care you need, when you need it. . in 4-5 weeks with Dr. Rockey Situ APP  Any Other Special Instructions Will Be Listed Below (If Applicable). - N/A

## 2018-08-29 NOTE — Progress Notes (Signed)
Office Visit    Patient Name: Tracy Mann Date of Encounter: 08/29/2018  Primary Care Provider:  Jerrol Banana., MD Primary Cardiologist:  Ida Rogue, MD  Chief Complaint    85 yo female with PMH CAD, atrial fibrillation on Coumadin, HTN, congestive heart failure, HLD presents today for weakness, loss of stamina, shortness of breath.   Past Medical History    Past Medical History:  Diagnosis Date  . Acute myocardial infarction of inferior wall ([place])    a. 10/2005 - Presumed to be 2/2 to a coronary embolus in setting of persistent Afib-->Cath relatively nl cors, EF 45-50% w/ severe distal inferior/apical inferior HK w/ aneursymal appearance.  . Chronic anticoagulation    a. warfarin.  . Chronic atrial fibrillation    a. CHA2DS2VASc = 6--> warfarin.  . Chronic combined systolic and diastolic CHF (congestive heart failure) ([place])    a. 10/2012 Echo: EF 35-40%, diff HK, mild MR, mild biatrial enlargement;  b. 11/2012 EF 45-50% by LV gram; c. echo 07/2014: EF 40-45%, mod conc LVH, mod MR, severe biatrial enlargement, PASP 45 mm Hg  . Essential hypertension   . Hyperlipidemia, mixed   . Mixed Ischemic/Nonischemic Cardiomyopathy    a. 10/2012 Echo: EF 35-40%;  b. 11/2012 EF 45-50% by LV gram.  . Obesity   . Thyroid disease    hypothyroidism  . Type II diabetes mellitus ([place])   . Vitamin D deficiency    Past Surgical History:  Procedure Laterality Date  . CARDIAC CATHETERIZATION  11/2012   [facility];EF 45-50%  . CHOLECYSTECTOMY  1999  . TUBAL LIGATION  1968  . VESICOVAGINAL FISTULA CLOSURE W/ TAH  1996    Allergies  Allergies  Allergen Reactions  . Other Anaphylaxis and Swelling    'tongue swelling'  . Sulfa Antibiotics Anaphylaxis  . Clarithromycin Other (See Comments)    Hives, headaches, hard time swelling, felt like throat was closing up Hives, headaches, hard time swelling, felt like throat was closing up  . Diphenhydramine Other (See Comments)  .  Estrogens Conjugated   . Estrogens, Conjugated Other (See Comments)  . Sulfonamide Derivatives Swelling    'tongue swelling'  . Nitrofurantoin Nausea Only    History of Present Illness    Tracy Mann is an 85 yo female with past cardiac history of atrial fibrillation on Coumadin, HTN, chronic systolic and diastolic heart failure, HLD, moderate pulmonary HTN, CAD s/p inferior wall MI 10/2005 at [facility] felt to be secondary to thrombus from atrial fib. Additional medical history includes DM2, chronic knee problems, hypothyroidism. Last seen by Dr. Rockey Situ 02/12/18.  She had an inferior wall MI 10/2005 at [facility] which was felt to be secondary to thrombus from atrial fibrillation. Echocardiogram 2014 with EF 35-40%. Subsequent cardiac cath 11/2012 showed right dominance coronary arterial system with mild luminal irregularities, no significant stenoses, EF 45%. Echo 07/2014 EF 40-45%, moderate regurgitation recommended for annual follow up, mildly increased pulmonary pressures. Chest pain episode 05/2015 with negative troponins prompted Myoview showing old inferior MI and no new ischemic. Echo 05/2015 EF 40-45%, moderate MR. Echo 01/2018 again with EF 40-45%, mild MR.   She has been evaluated by Dr. Ronalee Belts of vein and vascular in the past for LE edema and recommended TED hose or ACE wraps. Ultrasound negative for DVT in RLE 04/2013.   She is being treated for RLE cellulitis by her PCP.At office visit 08/22/18 with PCP she noted "chest pain for a few minutes" and DOE.  Presents today for chief complaints of weakness, loss of stamina, shortness of breath. Reports worsening dyspnea on exertion over the last 2 weeks. Does endorse chronic dyspnea on exertion. She previously could walk to her mailbox, but now having to stop and rest. Some shortness of breath with conversation. There are five steps into her home and she reports difficulty with these. Has been avoiding the full flight of stairs in her home for 6 weeks. A  week and a half ago she developed chest pain described as pressure while walking to the mailbox, relieved by one nitroglycerin and rest, associated with shortness of breath, lasting "a few minutes". No diaphoresis. Has not had recurrent chest pain. Her anginal equivalent for her MI in 2007 was chest pain and "suffocation".   Some confusion regarding her medications. Beta blocker previously stopped for bradycardia. Cardizem 30mg  PRN is listed on her medication list, but she is unaware of it. Take her Lasix 20mg  daily in the morning. Tells me she "wishes she could be off all my medications".   She is diabetic with 08/13/18 A1c of 8.7. Endorses her blood sugar is "not so good" and she doesn't follow her diet "to the letter". She endorses chronic pain in her lower extremities which limits her activity - is frustrated that she has to take pain medication. Was prescribed gabapentin for RLS by her PCP, but has not started this medication.  She tells me she is unable to eat large meals. Tells me she feels bloated and "too full". Is concerned her abdomen may be pushing on her diaphragm causing her shortness of breath.    EKGs/Labs/Other Studies Reviewed:   The following studies were reviewed today: Echo 02/15/18 Left ventricle: The cavity size was normal. Systolic function was   mildly to moderately reduced. The estimated ejection fraction was   in the range of 40% to 45%. Diffuse hypokinesis. Regional wall   motion abnormalities cannot be excluded. The study is not   technically sufficient to allow evaluation of LV diastolic   function. - Mitral valve: There was mild regurgitation. - Left atrium: The atrium was moderately dilated. - Right ventricle: Systolic function was normal. - Inferior vena cava: The vessel was dilated. The respirophasic   diameter changes were blunted (< 50%), consistent with elevated   central venous pressure.  Cath 11/2012 R dominant coronary system with mild luminal  irregularities. Low normal EF. EF estimated at 45-50%. Aneurysmal/severe hypokinetic region in distal inferior and apical inferior wall.  EKG:  EKG is ordered today.  The ekg ordered today demonstrates atrial fibrillation rate 94 with PVC and L axis deviation, stable compared to previous.   Recent Labs: 09/17/2017: TSH 1.870 02/26/2018: BNP 65.9 04/30/2018: ALT 15; BUN 21; Creatinine, Ser 0.85; Hemoglobin 12.6; NT-Pro BNP 531; Platelets 368; Potassium 4.7; Sodium 139   Recent Lipid Panel    Component Value Date/Time   CHOL 210 (H) 09/17/2017 1130   TRIG 194 (H) 09/17/2017 1130   HDL 50 09/17/2017 1130   CHOLHDL 4.2 09/17/2017 1130   CHOLHDL 6.0 05/21/2015 0142   VLDL 45 (H) 05/21/2015 0142   LDLCALC 121 (H) 09/17/2017 1130    Home Medications   Prior to Admission medications   Medication Sig Start Date End Date Taking? Authorizing Provider  ACCU-CHEK AVIVA PLUS test strip Use with meter to check glucose before meals and at bedtime 02/12/18   Jerrol Banana., MD  Coenzyme Q10 (CO Q 10 PO) Take by mouth.    [provider]  COMBIVENT RESPIMAT 20-100 MCG/ACT AERS respimat 1 puff every 6 (six) hours as needed.  07/03/14   [provider]  diltiazem (CARDIZEM) 30 MG tablet TAKE 1 TABLET (30 MG TOTAL) BY MOUTH 3 (THREE) TIMES DAILY AS NEEDED. 06/10/18   Minna Merritts, MD  estradiol (ESTRACE VAGINAL) 0.1 MG/GM vaginal cream Place 1 Applicatorful vaginally at bedtime. 07/24/18   Jerrol Banana., MD  ezetimibe (ZETIA) 10 MG tablet TAKE 1 TABLET BY MOUTH EVERY DAY 07/02/18   Jerrol Banana., MD  famotidine (PEPCID) 40 MG tablet TAKE 1 TABLET BY MOUTH EVERY DAY Patient not taking: Reported on 04/30/2018 07/09/17   Jerrol Banana., MD  fluconazole (DIFLUCAN) 100 MG tablet Take 1 tablet (100 mg total) by mouth daily. Patient not taking: Reported on 08/22/2018 08/06/18   Jerrol Banana., MD  fluticasone [facility]) 50 MCG/ACT nasal spray SPRAY TWO SPRAYS  IN [facility] NOSTRIL ONCE DAILY 06/07/18   Jerrol Banana., MD  furosemide (LASIX) 20 MG tablet Take 1 tablet (20 mg total) by mouth 2 (two) times daily as needed. 05/09/18   Minna Merritts, MD  gabapentin (NEURONTIN) 100 MG capsule Take 1 capsule (100 mg total) by mouth at bedtime. 08/13/18   Jerrol Banana., MD  HYDROcodone-acetaminophen [facility]) 10-325 MG tablet Take 1 tablet by mouth every 6 (six) hours as needed. 06/13/18   Jerrol Banana., MD  HYDROcodone-acetaminophen [facility]) 10-325 MG tablet Take 1 tablet by mouth every 6 (six) hours as needed. 06/13/18   Jerrol Banana., MD  HYDROcodone-acetaminophen [facility]) 10-325 MG tablet Take 1 tablet by mouth every 6 (six) hours as needed. 06/13/18   Jerrol Banana., MD  insulin lispro (HUMALOG) 100 UNIT/ML KiwkPen Inject 10-20 units three times daily with each meal. 08/29/17   Jerrol Banana., MD  Insulin Pen Needle (RELION PEN NEEDLE 31G/8MM) 31G X 8 MM MISC Use with insulin pen three times daily 03/08/18   Jerrol Banana., MD  isosorbide mononitrate (IMDUR) 30 MG 24 hr tablet Take 1 tablet (30 mg total) by mouth daily. 12/19/17   Jerrol Banana., MD  levothyroxine [place] [facility], LEVOTHROID) 125 MCG tablet Take 1 tablet (125 mcg total) by mouth daily before breakfast. 12/19/17   Jerrol Banana., MD  lisinopril (PRINIVIL,ZESTRIL) 2.5 MG tablet TAKE 1 TABLET BY MOUTH EVERYDAY AT BEDTIME 02/12/18   Jerrol Banana., MD  LORazepam (ATIVAN) 1 MG tablet BID prn 08/01/18   Jerrol Banana., MD  Magnesium 500 MG CAPS Take 500 mg by mouth daily.     [provider]  metFORMIN (GLUCOPHAGE-XR) 500 MG 24 hr tablet TAKE 2 TABLETS BY MOUTH EVERY DAY WITH BREAKFAST 04/10/18   Jerrol Banana., MD  nitrofurantoin, macrocrystal-monohydrate, (MACROBID) 100 MG capsule TAKE 1 CAPSULE BY MOUTH EVERY DAY Patient not taking: Reported on 08/22/2018 09/13/17   Jerrol Banana., MD  nitroGLYCERIN  (NITROSTAT) 0.4 MG SL tablet Place 1 tablet (0.4 mg total) under the tongue every 5 (five) minutes as needed for chest pain. 02/12/18   Minna Merritts, MD  vitamin B-12 (CYANOCOBALAMIN) 1000 MCG tablet Take 1,000 mcg by mouth daily.    [provider]  Vitamin D, Ergocalciferol, (DRISDOL) 50000 units CAPS capsule Take 50,000 Units by mouth.     [provider]  warfarin (COUMADIN) 2 MG tablet Take 1 tablet (2 mg total) by mouth  daily. 07/21/15   Jerrol Banana., MD  warfarin (COUMADIN) 2.5 MG tablet TAKE 1 TABLET BY MOUTH EVERY DAY 05/09/18   Jerrol Banana., MD  warfarin (COUMADIN) 3 MG tablet Take 1 tablet (3 mg total) by mouth daily. 12/19/17   Jerrol Banana., MD  warfarin (COUMADIN) 4 MG tablet Take 1 tablet (4 mg total) by mouth daily. 07/30/18   Jerrol Banana., MD  rOPINIRole (REQUIP) 0.25 MG tablet TAKE 1 TABLET BY MOUTH EVERYDAY AT BEDTIME 10/29/17 08/13/18  Jerrol Banana., MD   Review of Systems    Review of Systems  Constitution: Positive for malaise/fatigue. Negative for chills and fever.  Cardiovascular: Positive for chest pain (episode 1.5 weeks ago, see HPI), dyspnea on exertion and leg swelling. Negative for irregular heartbeat, palpitations and syncope.  Respiratory: Positive for shortness of breath. Negative for cough and wheezing.   Skin:       RLE cellulitis  Musculoskeletal: Positive for arthritis.  Gastrointestinal: Negative for melena, nausea and vomiting.  Genitourinary: Negative for hematuria.  Neurological: Positive for weakness. Negative for dizziness and light-headedness.    All other systems reviewed and are otherwise negative except as noted above.  Physical Exam    VS:  BP (!) 158/70 (BP Location: Left Arm, Patient Position: Sitting, Cuff Size: Large)   Pulse 94   Ht 5' (1.524 m)   Wt 235 lb 8 oz (106.8 kg)   SpO2 96%   BMI 45.99 kg/m  , BMI Body mass index is 45.99 kg/m. GEN: Well nourished, obese,  well developed, in no acute distress. HEENT: normal. Neck: Supple, no JVD, carotid bruits, or masses. Cardiac: Irregularly irregular, no murmurs, rubs, or gallops. No clubbing, cyanosis. RLE with nonpitting edema. LLE without edema.   VVS: Radials/DP/PT 2+ and equal bilaterally. Varicose veins bilateral lower extremities.  Respiratory:  Respirations regular and unlabored, clear to auscultation with diminished bases likely due to body habitus bilaterally. GI: Soft, nontender, nondistended, obese, BS + x 4. MS: no deformity or atrophy. Skin: warm and dry. RLE with quarter size abrasion to shin, erythematous, no exudate. Bilateral upper and lower extremities with AK.  Neuro:  Strength and sensation are intact. Psych: Normal affect, anxious.  Accessory Clinical Findings    ECG personally reviewed by me today - atrial fibrillation rate 94, PVC, L axis deviation, stable compared to previous - no acute changes.  Assessment & Plan    1.  DOE/Fatigue - Chronic dyspnea on exertion with worsening over the last 2 weeks. Reports decreased stamina. Concerned she just has no energy. Tells me she can't keep living like this or she will have to go to assisted living. Now cannot walk up the small hill to her mailbox without stopping to get a deep breath. Checks her oxygen at home, no hypoxia. Some shortness of breath with conversion.   Etiology atrial fibrillation with uncontrolled rate vs deconditioning vs hypothyroidism vs uncontrolled DM2 vs anemia in the setting of Warfarin.  TSH, CMP, magnesium, CBC today.   51 day Zio for assessment of rate control in setting of chronic atrial fibrillation.   2. Chronic atrial fibrillation - EKG today atrial fibrillation rate uncontrolled at 94. Beta blocker previously discontinued for bradycardia. Med list shows Cardizem 30mg  PRN, but she is unfamiliar with this medication and not taking. Checks HR intermittently at home with rates 60s-90s. 14 day Zio for assessment of  rate control as this may be contributing to her fatigue  and dyspnea. As BP is elevated today would consider addition of Cardizem or Coreg for additional BP benefit. Anticoagulated on Warfarin managed by her PCP. Of note, did have subtherapeutic INR 6/9 1.9, 6/30 1/7, 7/14 1.7 - INR improved to 2.2 on 08/22/18. Oxygen saturation 96% today on exam. Reports no hypoxia at home.   3. Chronic combined systolic and diastolic heart failure - Echo 01/2018 FE 40-45%, mild MR. She takes Lasix 20mg  daily. Reports her LE edema is "about the same". Has been recently treated for RLE cellulitis by her PCP and that swelling has improved. Progressive DOE, as above. NYHA class II-III. No indication for repeat echocardiogram at this time. Stable LE edema on exam. No adventitious lung sounds. Weight stable. Low suspicion this dyspnea and fatigue are secondary to her CHF.   4. Nonobstructive coronary artery disease - Cath 2014 with R dominant coronary system with mild luminal irregularities. Aneurysmal/severe hypokinetic region in distal inferior and apical inferior wall consistent with known history of inferior MI 2007. Intolerant of Imdur due to headache. New progressive DOE, as above. Episode of chest pain 1.5 weeks ago while walking up hill. Started to feel short of breath then "pressure" in her midsternum. Relieved by 1 nitroglycerin. No recurrent chest pain. Anginal equivalent is chest pain and feeling of "suffocation". Tells me recent event was "not to that level". She verbalizes understanding of when to use her nitroglycerin.  If workup above non revealing may consider ischemic evaluation.   5. Hypothyroidism - Noted history. Last TSH 08/2017. Recheck today as possible etiology fatigue.  6. DM2 - Follows with PCP. A1c 8.7 on 08/13/18. Possible etiology of fatigue. If additional agent needed would suggest SGLT2 such as dapaglifozin due to cardiac benefit.   7. RLE cellulitis - Noted on exam. Being followed by PCP. RLE with  non pitting edema, quarter sized abrasion to shin without exudate, no weeping, noted erythema.   Disposition: 14 day Zio for assessment of rate control. Consider addition Cardizem vs Coreg. Labs today. 4 week follow up with APP or Dr. Rockey Situ.   Loel Dubonnet, NP 08/29/2018, 11:56 AM

## 2018-08-30 LAB — CBC WITH DIFFERENTIAL/PLATELET
Basophils Absolute: 0.1 10*3/uL (ref 0.0–0.2)
Basos: 1 %
EOS (ABSOLUTE): 0.1 10*3/uL (ref 0.0–0.4)
Eos: 2 %
Hematocrit: 41.5 % (ref 34.0–46.6)
Hemoglobin: 13.5 g/dL (ref 11.1–15.9)
Immature Grans (Abs): 0 10*3/uL (ref 0.0–0.1)
Immature Granulocytes: 0 %
Lymphocytes Absolute: 2.2 10*3/uL (ref 0.7–3.1)
Lymphs: 23 %
MCH: 29.7 pg (ref 26.6–33.0)
MCHC: 32.5 g/dL (ref 31.5–35.7)
MCV: 91 fL (ref 79–97)
Monocytes Absolute: 0.6 10*3/uL (ref 0.1–0.9)
Monocytes: 6 %
Neutrophils Absolute: 6.5 10*3/uL (ref 1.4–7.0)
Neutrophils: 68 %
Platelets: 225 10*3/uL (ref 150–450)
RBC: 4.54 x10E6/uL (ref 3.77–5.28)
RDW: 12.9 % (ref 11.7–15.4)
WBC: 9.5 10*3/uL (ref 3.4–10.8)

## 2018-08-30 LAB — COMPREHENSIVE METABOLIC PANEL
ALT: 13 IU/L (ref 0–32)
AST: 18 IU/L (ref 0–40)
Albumin/Globulin Ratio: 1.3 (ref 1.2–2.2)
Albumin: 4 g/dL (ref 3.6–4.6)
Alkaline Phosphatase: 99 IU/L (ref 39–117)
BUN/Creatinine Ratio: 23 (ref 12–28)
BUN: 18 mg/dL (ref 8–27)
Bilirubin Total: 0.5 mg/dL (ref 0.0–1.2)
CO2: 24 mmol/L (ref 20–29)
Calcium: 9.3 mg/dL (ref 8.7–10.3)
Chloride: 100 mmol/L (ref 96–106)
Creatinine, Ser: 0.78 mg/dL (ref 0.57–1.00)
GFR calc Af Amer: 81 mL/min/{1.73_m2} (ref >59)
GFR calc non Af Amer: 71 mL/min/{1.73_m2} (ref >59)
Globulin, Total: 3 g/dL (ref 1.5–4.5)
Glucose: 242 mg/dL — ABNORMAL HIGH (ref 65–99)
Potassium: 4.9 mmol/L (ref 3.5–5.2)
Sodium: 139 mmol/L (ref 134–144)
Total Protein: 7 g/dL (ref 6.0–8.5)

## 2018-08-30 LAB — MAGNESIUM: Magnesium: 2.1 mg/dL (ref 1.6–2.3)

## 2018-08-30 LAB — TSH: TSH: 0.888 u[IU]/mL (ref 0.450–4.500)

## 2018-09-05 ENCOUNTER — Ambulatory Visit: Payer: Medicare Other | Admitting: Physician Assistant

## 2018-09-08 ENCOUNTER — Other Ambulatory Visit: Payer: Self-pay | Admitting: Family Medicine

## 2018-09-09 NOTE — Telephone Encounter (Signed)
See Rx request ° °

## 2018-09-10 ENCOUNTER — Ambulatory Visit (INDEPENDENT_AMBULATORY_CARE_PROVIDER_SITE_OTHER): Payer: Medicare Other | Admitting: Family Medicine

## 2018-09-10 ENCOUNTER — Other Ambulatory Visit: Payer: Self-pay

## 2018-09-10 ENCOUNTER — Encounter: Payer: Self-pay | Admitting: Family Medicine

## 2018-09-10 VITALS — BP 124/78 | HR 100 | Temp 98.5°F | Resp 16 | Ht 60.0 in | Wt 235.0 lb

## 2018-09-10 DIAGNOSIS — I482 Chronic atrial fibrillation, unspecified: Secondary | ICD-10-CM | POA: Diagnosis not present

## 2018-09-10 DIAGNOSIS — M159 Polyosteoarthritis, unspecified: Secondary | ICD-10-CM

## 2018-09-10 DIAGNOSIS — Z6841 Body Mass Index (BMI) 40.0 and over, adult: Secondary | ICD-10-CM

## 2018-09-10 DIAGNOSIS — M150 Primary generalized (osteo)arthritis: Secondary | ICD-10-CM | POA: Diagnosis not present

## 2018-09-10 LAB — POCT INR
INR: 3.8 — AB (ref 2.0–3.0)
PT: 46.1

## 2018-09-10 MED ORDER — HYDROCODONE-ACETAMINOPHEN 10-325 MG PO TABS: 1.0000 | ORAL_TABLET | Freq: Four times a day (QID) | ORAL | 0 refills | Status: DC | PRN

## 2018-09-10 MED ORDER — LORAZEPAM 1 MG PO TABS: ORAL_TABLET | ORAL | 1 refills | Status: DC

## 2018-09-10 NOTE — Progress Notes (Signed)
Patient: Tracy Mann Female    DOB: 05/25/1933   85 y.o.   MRN: [id number] Visit Date: 09/10/2018  Today's Provider: Wilhemena Durie, MD   Chief Complaint  Patient presents with  . Follow-up   Subjective:   HPI Patient comes in today for a follow up. She was last seen in the office 3 weeks ago. She was treated for cellulitis, and she reports that this has improved. She is no longer having pain or drainage.  BP Readings from Last 3 Encounters:  09/10/18 124/78  08/29/18 (!) 158/70  08/22/18 126/74   Wt Readings from Last 3 Encounters:  09/10/18 235 lb (106.6 kg)  08/29/18 235 lb 8 oz (106.8 kg)  08/22/18 236 lb (107 kg)     Allergies  Allergen Reactions  . Other Anaphylaxis and Swelling    'tongue swelling'  . Sulfa Antibiotics Anaphylaxis  . Clarithromycin Other (See Comments)    Hives, headaches, hard time swelling, felt like throat was closing up Hives, headaches, hard time swelling, felt like throat was closing up  . Diphenhydramine Other (See Comments)  . Estrogens Conjugated   . Estrogens, Conjugated Other (See Comments)  . Sulfonamide Derivatives Swelling    'tongue swelling'  . Nitrofurantoin Nausea Only     Current Outpatient Medications:  .  ACCU-CHEK AVIVA PLUS test strip, Use with meter to check glucose before meals and at bedtime, Disp: 200 each, Rfl: 12 .  Coenzyme Q10 (CO Q 10 PO), Take by mouth., Disp: , Rfl:  .  COMBIVENT RESPIMAT 20-100 MCG/ACT AERS respimat, 1 puff every 6 (six) hours as needed. , Disp: , Rfl:  .  ezetimibe (ZETIA) 10 MG tablet, TAKE 1 TABLET BY MOUTH EVERY DAY, Disp: 90 tablet, Rfl: 3 .  famotidine (PEPCID) 40 MG tablet, TAKE 1 TABLET BY MOUTH EVERY DAY, Disp: 30 tablet, Rfl: 11 .  fluticasone (FLONASE) 50 MCG/ACT nasal spray, SPRAY TWO SPRAYS IN EACH NOSTRIL ONCE DAILY, Disp: 48 g, Rfl: 3 .  furosemide (LASIX) 20 MG tablet, Take 1 tablet (20 mg total) by mouth 2 (two) times daily as needed., Disp: 180 tablet, Rfl: 3  .  HYDROcodone-acetaminophen (NORCO) 10-325 MG tablet, Take 1 tablet by mouth every 6 (six) hours as needed., Disp: 120 tablet, Rfl: 0 .  HYDROcodone-acetaminophen (NORCO) 10-325 MG tablet, Take 1 tablet by mouth every 6 (six) hours as needed., Disp: 120 tablet, Rfl: 0 .  HYDROcodone-acetaminophen (NORCO) 10-325 MG tablet, Take 1 tablet by mouth every 6 (six) hours as needed., Disp: 120 tablet, Rfl: 0 .  insulin lispro (HUMALOG KWIKPEN) 100 UNIT/ML KwikPen, INJECT 10-20 UNITS THREE TIMES DAILY WITH EACH MEAL., Disp: 15 mL, Rfl: 12 .  Insulin Pen Needle (RELION PEN NEEDLE 31G/8MM) 31G X 8 MM MISC, Use with insulin pen three times daily, Disp: 100 each, Rfl: 12 .  levothyroxine (SYNTHROID, LEVOTHROID) 125 MCG tablet, Take 1 tablet (125 mcg total) by mouth daily before breakfast., Disp: 30 tablet, Rfl: 12 .  lisinopril (PRINIVIL,ZESTRIL) 2.5 MG tablet, TAKE 1 TABLET BY MOUTH EVERYDAY AT BEDTIME, Disp: 90 tablet, Rfl: 3 .  LORazepam (ATIVAN) 1 MG tablet, BID prn, Disp: 60 tablet, Rfl: 1 .  Magnesium 500 MG CAPS, Take 500 mg by mouth daily. , Disp: , Rfl:  .  nitrofurantoin, macrocrystal-monohydrate, (MACROBID) 100 MG capsule, TAKE 1 CAPSULE BY MOUTH EVERY DAY, Disp: 30 capsule, Rfl: 10 .  nitroGLYCERIN (NITROSTAT) 0.4 MG SL tablet, Place 1 tablet (0.4 mg  total) under the tongue every 5 (five) minutes as needed for chest pain., Disp: 25 tablet, Rfl: 3 .  Vitamin D, Ergocalciferol, (DRISDOL) 50000 units CAPS capsule, Take 50,000 Units by mouth. , Disp: , Rfl:  .  warfarin (COUMADIN) 2 MG tablet, Take 1 tablet (2 mg total) by mouth daily., Disp: 30 tablet, Rfl: 3 .  warfarin (COUMADIN) 2.5 MG tablet, TAKE 1 TABLET BY MOUTH EVERY DAY, Disp: 30 tablet, Rfl: 5 .  warfarin (COUMADIN) 3 MG tablet, Take 1 tablet (3 mg total) by mouth daily., Disp: 30 tablet, Rfl: 12 .  warfarin (COUMADIN) 4 MG tablet, Take 1 tablet (4 mg total) by mouth daily., Disp: 30 tablet, Rfl: 12 .  diltiazem (CARDIZEM) 30 MG tablet, TAKE  1 TABLET (30 MG TOTAL) BY MOUTH 3 (THREE) TIMES DAILY AS NEEDED. (Patient not taking: Reported on 08/29/2018), Disp: 270 tablet, Rfl: 0 .  fluconazole (DIFLUCAN) 100 MG tablet, Take 1 tablet (100 mg total) by mouth daily. (Patient not taking: Reported on 09/10/2018), Disp: 2 tablet, Rfl: 0  Review of Systems  Constitutional: Negative for activity change and fatigue.  HENT: Negative.  Negative for sinus pain.   Eyes: Negative.   Respiratory: Negative.  Negative for cough and shortness of breath.   Cardiovascular: Negative.  Negative for chest pain, palpitations and leg swelling.  Endocrine: Negative.   Musculoskeletal: Positive for back pain. Negative for arthralgias and joint swelling.  Allergic/Immunologic: Negative.   Neurological: Negative for dizziness, light-headedness and headaches.  Hematological: Negative.   Psychiatric/Behavioral: Negative.  Negative for agitation, decreased concentration, dysphoric mood, self-injury, sleep disturbance and suicidal ideas. The patient is not nervous/anxious.   All other systems reviewed and are negative.   Social History   Tobacco Use  . Smoking status: Never Smoker  . Smokeless tobacco: Never Used  Substance Use Topics  . Alcohol use: No      Objective:   BP 124/78   Pulse 100   Temp 98.5 F (36.9 C)   Resp 16   Ht 5' (1.524 m)   Wt 235 lb (106.6 kg)   SpO2 95%   BMI 45.90 kg/m  Vitals:   09/10/18 1502  BP: 124/78  Pulse: 100  Resp: 16  Temp: 98.5 F (36.9 C)  SpO2: 95%  Weight: 235 lb (106.6 kg)  Height: 5' (1.524 m)     Physical Exam Vitals signs reviewed.  Constitutional:      Appearance: She is well-developed. She is obese.  HENT:     Head: Normocephalic and atraumatic.     Right Ear: External ear normal.     Left Ear: External ear normal.     Nose: Nose normal.  Eyes:     General: No scleral icterus.    Conjunctiva/sclera: Conjunctivae normal.  Neck:     Thyroid: No thyromegaly.  Cardiovascular:     Rate  and Rhythm: Normal rate and regular rhythm.     Heart sounds: Normal heart sounds.  Pulmonary:     Effort: Pulmonary effort is normal.     Breath sounds: Normal breath sounds.  Abdominal:     Palpations: Abdomen is soft.  Musculoskeletal:     Comments: 1+ LE  edema.  Skin:    General: Skin is warm and dry.     Comments: Very fair skin.  Significant AK's, especially arms and legs.   Neurological:     General: No focal deficit present.     Mental Status: She is alert and oriented  to person, place, and time. Mental status is at baseline.  Psychiatric:        Mood and Affect: Mood normal.        Behavior: Behavior normal.        Thought Content: Thought content normal.        Judgment: Judgment normal.      No results found for any visits on 09/10/18.     Assessment & Plan    1. Primary osteoarthritis involving multiple joints Pt advised to cut back on narcotics. - HYDROcodone-acetaminophen (NORCO) 10-325 MG tablet; Take 1 tablet by mouth every 6 (six) hours as needed.  Dispense: 120 tablet; Refill: 0  2. Chronic atrial fibrillation  - POCT INR--3.8 today  3. Class 3 severe obesity due to excess calories with serious comorbidity and body mass index (BMI) of 45.0 to 49.9 in adult [facility]) Need for weight loss discussed.     Mckinzy Fuller Cranford Mon, MD  [facility]

## 2018-09-10 NOTE — Patient Instructions (Signed)
Description   Dx: Atrial Fibrillation Current Coumadin Dose: 4 mg daily CHANGES:  3 mg daily. Return: in 2 weeks.

## 2018-09-18 DIAGNOSIS — I482 Chronic atrial fibrillation, unspecified: Secondary | ICD-10-CM | POA: Diagnosis not present

## 2018-09-24 ENCOUNTER — Ambulatory Visit: Payer: Medicare Other

## 2018-09-24 ENCOUNTER — Other Ambulatory Visit: Payer: Self-pay

## 2018-09-24 DIAGNOSIS — Z7901 Long term (current) use of anticoagulants: Secondary | ICD-10-CM

## 2018-09-24 LAB — POCT INR
INR: 2.4 (ref 2.0–3.0)
PT: 28.9

## 2018-09-24 NOTE — Patient Instructions (Signed)
Continue current dose.

## 2018-09-25 ENCOUNTER — Ambulatory Visit: Payer: Self-pay

## 2018-09-26 ENCOUNTER — Telehealth: Payer: Self-pay | Admitting: *Deleted

## 2018-09-26 NOTE — Telephone Encounter (Signed)
Patient's ZIO monitor results uploaded to Dr Rockey Situ yesterday. Dr Rockey Situ was notified that patient had some pauses on the summary. He reviewed and asked I call patient to see if she had any symptoms during that time.  Patient reports she usually sleeps until 8 or 9 am. Sometimes until 10 am if she couldn't get to sleep early enough the night before. She does not recall any episodes of dizziness or chest pain or any other symptoms during the pauses dates and times. She took those times down and was going to ask her family if they remembered her reporting any to them about that.  She also said she has such a hard time to getting to sleep and would like something to help with that. Advised her she should check with her PCP again even though says her PCP does not want to give her anything.  Patient has quit walking up and down her driveway. She is not trying to exercise anymore. She feels considerably better. Can't get out in the hot or cold weather.   She is aware of upcoming appointment date and time and advised any changes would be reviewed at that time. She will let us know if she finds out anything else.

## 2018-09-26 NOTE — Telephone Encounter (Signed)
Given her chronic fatigue and shortness of breath and pauses in the morning hours when sleeping This does raise the concern for sleep apnea Which she consider getting tested.  If she has underlying sleep apnea this might be contributing to her symptoms

## 2018-09-30 NOTE — Telephone Encounter (Signed)
Patient is aware of results and was contacted on 09/26/18 with them in results note. Patient has appointment with Dr Rockey Situ on 10/08/18 to discuss further.

## 2018-10-07 NOTE — Progress Notes (Signed)
Cardiology Office Note  Date:  10/08/2018   ID:  Breeauna, Oleson 1933-02-11, MRN [id number]  PCP:  Jerrol Banana., MD   Chief Complaint  Patient presents with  . OTHER    4-5 wk f/u c/o sob and edema ankles. Meds reviewed verbally with pt.    HPI:  Ms. Compres is a 85 year old woman with a PMH significant for  chronic atrial fibrillation on Coumadin therapy,  diastolic dysfunction, EF 40 to 45% on echo 01/2018  hypertension,  hyperlipidemia,  morbid obesity,   moderate pulmonary hypertension inferior wall myocardial infarction felt to be secondary to a thrombus from atrial fib in October 2007 at [facility],   episodes of chest pain  who presents for followup Of her coronary artery disease and leg edema.  Continued shortness of breath Takes lasix PRN  Sometimes forgets or does not take it secondary to bladder spasm  Some pauses while sleeping in the AM Poor sleep, chronic issue  legs are getting weaker  Monitor, reviewed in detail today Atrial fibrillation, 100% burden avg of 83 bpm  12 Ventricular Tachycardia runs occurred, the run with the fastest interval lasting 12 beats with a max rate of 203 bpm, the longest lasting 8 beats with an avg rate of 133 bpm.   3 Pauses occurred, the longest lasting 4.2 secs (14 bpm). Happened while sleeping On 09/01/18: at 8:06 Am, 4.2 sec On 09/02/18 at 9:15 AM, 3.2 sec On 09/08/18 at 10:21 AM, 3.1 sec.   Isolated VEs were occasional (1.7%, [id number]), VE Couplets were rare (<1.0%, 2096)  Asymptomatic from her PVCs  Chronic lower extremity swelling Does not wear compression hose  EKG personally reviewed by myself on todays visit shows atrial fibrillation with ventricular rate 81 bpm left bundle branch block, left anterior fascicular block, rare PVC  Other past medical history reviewed In follow-up today, she reports that she had an episode of chest pain 05/21/2015 on the left side She went to the emergency  room, was admitted. Hospital records reviewed. Cardiac enzymes were unrevealing, EKG unchanged. She had stress Myoview showing old inferior MI with no ischemia consistent with her known anatomy. She did not take nitroglycerin for her symptoms.  Previously seen by vein and vascular, Dr. Ronalee Belts, recommended to wear TED hose or ACE wraps It was mentioned that perhaps she could wear lymphedema compression pump. She does not have this yet She does sit down during the daytime with her legs down, unable to get her legs up very high Difficulty touching her toes, unable to put compression hose on     PMH:   has a past medical history of Acute myocardial infarction of inferior wall ([place]), Arthritis, Chronic anticoagulation, Chronic atrial fibrillation, Chronic combined systolic and diastolic CHF (congestive heart failure) ([place]), Essential hypertension, Hyperlipidemia, mixed, Mixed Ischemic/Nonischemic Cardiomyopathy, Nonobstructive coronary artery disease, Obesity, Thyroid disease, Type II diabetes mellitus ([place]), Venous insufficiency, and Vitamin D deficiency.  PSH:    Past Surgical History:  Procedure Laterality Date  . CARDIAC CATHETERIZATION  11/2012   [facility];EF 45-50%  . CHOLECYSTECTOMY  1999  . TUBAL LIGATION  1968  . VESICOVAGINAL FISTULA CLOSURE W/ TAH  1996    Current Outpatient Medications  Medication Sig Dispense Refill  . ACCU-CHEK AVIVA PLUS test strip Use with meter to check glucose before meals and at bedtime 200 each 12  . Coenzyme Q10 (CO Q 10 PO) Take by mouth.    . COMBIVENT RESPIMAT 20-100 MCG/ACT AERS  respimat 1 puff every 6 (six) hours as needed.     . diltiazem (CARDIZEM) 30 MG tablet TAKE 1 TABLET (30 MG TOTAL) BY MOUTH 3 (THREE) TIMES DAILY AS NEEDED. 270 tablet 0  . ezetimibe (ZETIA) 10 MG tablet TAKE 1 TABLET BY MOUTH EVERY DAY 90 tablet 3  . fluticasone (FLONASE) 50 MCG/ACT nasal spray SPRAY TWO SPRAYS IN EACH NOSTRIL ONCE DAILY 48 g 3  . furosemide (LASIX) 20 MG  tablet Take 1 tablet (20 mg total) by mouth 2 (two) times daily as needed. 180 tablet 3  . HYDROcodone-acetaminophen (NORCO) 10-325 MG tablet Take 1 tablet by mouth every 6 (six) hours as needed. 120 tablet 0  . insulin lispro (HUMALOG KWIKPEN) 100 UNIT/ML KwikPen INJECT 10-20 UNITS THREE TIMES DAILY WITH EACH MEAL. 15 mL 12  . Insulin Pen Needle (RELION PEN NEEDLE 31G/8MM) 31G X 8 MM MISC Use with insulin pen three times daily 100 each 12  . levothyroxine (SYNTHROID, LEVOTHROID) 125 MCG tablet Take 1 tablet (125 mcg total) by mouth daily before breakfast. 30 tablet 12  . lisinopril (PRINIVIL,ZESTRIL) 2.5 MG tablet TAKE 1 TABLET BY MOUTH EVERYDAY AT BEDTIME 90 tablet 3  . LORazepam (ATIVAN) 1 MG tablet BID prn 60 tablet 1  . Magnesium 500 MG CAPS Take 500 mg by mouth daily.     . nitrofurantoin, macrocrystal-monohydrate, (MACROBID) 100 MG capsule TAKE 1 CAPSULE BY MOUTH EVERY DAY 30 capsule 10  . nitroGLYCERIN (NITROSTAT) 0.4 MG SL tablet Place 1 tablet (0.4 mg total) under the tongue every 5 (five) minutes as needed for chest pain. 25 tablet 3  . Vitamin D, Ergocalciferol, (DRISDOL) 50000 units CAPS capsule Take 50,000 Units by mouth.     . warfarin (COUMADIN) 2 MG tablet Take 1 tablet (2 mg total) by mouth daily. 30 tablet 3  . warfarin (COUMADIN) 2.5 MG tablet TAKE 1 TABLET BY MOUTH EVERY DAY 30 tablet 5  . warfarin (COUMADIN) 3 MG tablet Take 1 tablet (3 mg total) by mouth daily. 30 tablet 12  . warfarin (COUMADIN) 4 MG tablet Take 1 tablet (4 mg total) by mouth daily. 30 tablet 12  . HYDROcodone-acetaminophen (NORCO) 10-325 MG tablet Take 1 tablet by mouth every 6 (six) hours as needed. (Patient not taking: Reported on 10/08/2018) 120 tablet 0  . HYDROcodone-acetaminophen (NORCO) 10-325 MG tablet Take 1 tablet by mouth every 6 (six) hours as needed. (Patient not taking: Reported on 10/08/2018) 120 tablet 0   No current facility-administered medications for this visit.      Allergies:   Other;  Sulfa antibiotics; Clarithromycin; Diphenhydramine; Estrogens conjugated; Estrogens, conjugated; Sulfonamide derivatives; and Nitrofurantoin   Social History:  The patient  reports that she has never smoked. She has never used smokeless tobacco. She reports that she does not drink alcohol or use drugs.   Family History:   family history includes Heart disease in her mother; Thyroid disease in her father.    Review of Systems: Review of Systems  Constitutional: Negative.   HENT: Negative.   Respiratory: Positive for shortness of breath.   Cardiovascular: Positive for leg swelling.  Gastrointestinal: Negative.   Musculoskeletal: Negative.   Neurological: Negative.   Psychiatric/Behavioral: Negative.   All other systems reviewed and are negative.    PHYSICAL EXAM: VS:  BP 130/70 (BP Location: Left Arm, Patient Position: Sitting, Cuff Size: Large)   Pulse 81   Ht 5' 5.5" (1.664 m)   Wt 235 lb 8 oz (106.8 kg)  BMI 38.59 kg/m  , BMI Body mass index is 38.59 kg/m.  Constitutional:  oriented to person, place, and time. No distress.  HENT:  Head: Grossly normal Eyes:  no discharge. No scleral icterus.  Neck: No JVD, no carotid bruits  Cardiovascular: Regular rate and rhythm, no murmurs appreciated Nonpitting lower extremity edema, skin is tight, erythematous, venous malformations Pulmonary/Chest: Clear to auscultation bilaterally, no wheezes or rails Abdominal: Soft.  no distension.  no tenderness.  Musculoskeletal: Normal range of motion Neurological:  normal muscle tone. Coordination normal. No atrophy Skin: Skin warm and dry Psychiatric: normal affect, pleasant   Recent Labs: 02/26/2018: BNP 65.9 04/30/2018: NT-Pro BNP 531 08/29/2018: ALT 13; BUN 18; Creatinine, Ser 0.78; Hemoglobin 13.5; Magnesium 2.1; Platelets 225; Potassium 4.9; Sodium 139; TSH 0.888    Lipid Panel Lab Results  Component Value Date   CHOL 210 (H) 09/17/2017   HDL 50 09/17/2017   LDLCALC 121 (H)  09/17/2017   TRIG 194 (H) 09/17/2017      Wt Readings from Last 3 Encounters:  10/08/18 235 lb 8 oz (106.8 kg)  09/10/18 235 lb (106.6 kg)  08/29/18 235 lb 8 oz (106.8 kg)     ASSESSMENT AND PLAN: Chest pain Denies having any chest pain at this time No further ischemic work-up needed  Mixed hyperlipidemia Unable to tolerate statins, tolerating zetia  Recommended compliance with her Zetia Long discussion concerning her diet  Essential hypertension - Plan: EKG 12-Lead Blood pressure is well controlled on today's visit. No changes made to the medications.  Atherosclerosis of native coronary artery of native heart without angina pectoris Prior inferior MI No significant change in EKG Echo showing stable ejection fraction 45% earlier this year Stressed importance of staying on her Lasix  Mixed Ischemic/Nonischemic Cardiomyopathy - Plan: EKG 12-Lead Recommended Lasix 20 twice daily Leg swelling appears worse Sitting with legs down, unable to tolerate compression hose  Chronic atrial fibrillation (HCC) - Plan: EKG 12-Lead Tolerating warfarin, rate well controlled Bradycardia on metoprolol Rate well controlled, does not take diltiazem  Chronic combined systolic and diastolic CHF (congestive heart failure) ([place]) Long discussion with her, she is eating out less during quarantine weight still up 3 pounds Needs to take Lasix 20 twice daily Currently only taking this as needed Rare cramping  Shortness of breath - Plan: EKG 12-Lead Decrease salt intake, fluid intake, Lasix as above  Obstructive sleep apnea  Type 2 diabetes mellitus without complication, with long-term current use of insulin ([place]) Managed by primary care and endocrine HBA1C greater than 8 Stressed importance of low carbohydrate diet  PVCs Asymptomatic, no further work-up needed  Pauses Likely secondary to sleep apnea, not wearing her CPAP Has 4-second pauses sparingly 3 in 2 weeks time when  sleeping Stressed importance of wearing her CPAP  Long discussion with her concerning heart failure management, titration of her Lasix, dietary changes needed Monitor results reviewed with her including PVCs, pauses  Total encounter time more than 35 minutes  Greater than 50% was spent in counseling and coordination of care with the patient   Follow-up 6 months   Orders Placed This Encounter  Procedures  . EKG 12-Lead     Signed, Esmond Plants, M.D., Ph.D. 10/08/2018  [place], [place]

## 2018-10-08 ENCOUNTER — Encounter: Payer: Self-pay | Admitting: Cardiovascular Disease

## 2018-10-08 ENCOUNTER — Ambulatory Visit (INDEPENDENT_AMBULATORY_CARE_PROVIDER_SITE_OTHER): Payer: Medicare Other | Admitting: Cardiovascular Disease

## 2018-10-08 ENCOUNTER — Other Ambulatory Visit: Payer: Self-pay

## 2018-10-08 VITALS — BP 130/70 | HR 81 | Ht 65.5 in | Wt 235.5 lb

## 2018-10-08 DIAGNOSIS — R0609 Other forms of dyspnea: Secondary | ICD-10-CM

## 2018-10-08 DIAGNOSIS — E1159 Type 2 diabetes mellitus with other circulatory complications: Secondary | ICD-10-CM

## 2018-10-08 DIAGNOSIS — Z794 Long term (current) use of insulin: Secondary | ICD-10-CM

## 2018-10-08 DIAGNOSIS — R0602 Shortness of breath: Secondary | ICD-10-CM

## 2018-10-08 DIAGNOSIS — E782 Mixed hyperlipidemia: Secondary | ICD-10-CM

## 2018-10-08 DIAGNOSIS — I25118 Atherosclerotic heart disease of native coronary artery with other forms of angina pectoris: Secondary | ICD-10-CM

## 2018-10-08 DIAGNOSIS — I5042 Chronic combined systolic (congestive) and diastolic (congestive) heart failure: Secondary | ICD-10-CM

## 2018-10-08 DIAGNOSIS — I482 Chronic atrial fibrillation, unspecified: Secondary | ICD-10-CM

## 2018-10-08 DIAGNOSIS — R5382 Chronic fatigue, unspecified: Secondary | ICD-10-CM

## 2018-10-08 NOTE — Patient Instructions (Addendum)
Medication Instructions:  Take lasix 20 mg  in the AM and 2 pm  Restart a walking program  If you need a refill on your cardiac medications before your next appointment, please call your pharmacy.    Lab work: No new labs needed   If you have labs (blood work) drawn today and your tests are completely normal, you will receive your results only by: Marland Kitchen MyChart Message (if you have MyChart) OR . A paper copy in the mail If you have any lab test that is abnormal or we need to change your treatment, we will call you to review the results.   Testing/Procedures: No new testing needed   Follow-Up: At [facility], you and your health needs are our priority.  As part of our continuing mission to provide you with exceptional heart care, we have created designated Provider Care Teams.  These Care Teams include your primary Cardiologist (physician) and Advanced Practice Providers (APPs -  Physician Assistants and Nurse Practitioners) who all work together to provide you with the care you need, when you need it.  . You will need a follow up appointment in 6 months .   Please call our office 2 months in advance to schedule this appointment.    . Providers on your designated Care Team:   . Murray Hodgkins, NP . Christell Faith, PA-C . Marrianne Mood, PA-C  Any Other Special Instructions Will Be Listed Below (If Applicable).  For educational health videos Log in to : [email or web address] Or : [email or web address], password : [email or web address]

## 2018-10-09 ENCOUNTER — Other Ambulatory Visit: Payer: Self-pay | Admitting: Family Medicine

## 2018-10-10 ENCOUNTER — Other Ambulatory Visit: Payer: Self-pay | Admitting: Family Medicine

## 2018-10-10 NOTE — Telephone Encounter (Signed)
Pt needs a refill on generic Microbid 100 mg  [facility]

## 2018-10-10 NOTE — Telephone Encounter (Signed)
Please review

## 2018-10-14 MED ORDER — NITROFURANTOIN MONOHYD MACRO 100 MG PO CAPS: 100.0000 mg | ORAL_CAPSULE | Freq: Every day | ORAL | 2 refills | Status: DC

## 2018-10-29 ENCOUNTER — Ambulatory Visit (INDEPENDENT_AMBULATORY_CARE_PROVIDER_SITE_OTHER): Payer: Medicare Other

## 2018-10-29 ENCOUNTER — Other Ambulatory Visit: Payer: Self-pay

## 2018-10-29 DIAGNOSIS — I482 Chronic atrial fibrillation, unspecified: Secondary | ICD-10-CM

## 2018-10-29 LAB — POCT INR
INR: 1.5 — AB (ref 2.0–3.0)
PT: 17.8

## 2018-10-29 NOTE — Patient Instructions (Signed)
Description   Dx: Atrial Fibrillation Current Coumadin Dose: 3 mg daily CHANGES:  3mg  M,W,F & 4mg  T,TH,SAT,SUN Return: in 2 weeks

## 2018-11-12 ENCOUNTER — Other Ambulatory Visit: Payer: Self-pay

## 2018-11-12 ENCOUNTER — Ambulatory Visit (INDEPENDENT_AMBULATORY_CARE_PROVIDER_SITE_OTHER): Payer: Medicare Other

## 2018-11-12 DIAGNOSIS — I482 Chronic atrial fibrillation, unspecified: Secondary | ICD-10-CM

## 2018-11-12 LAB — POCT INR
INR: 2.9 (ref 2.0–3.0)
PT: 34.4

## 2018-11-12 NOTE — Patient Instructions (Signed)
Continue 4mg  daily except 3mg  on M, W, F.  Recheck on 12/02/2018.

## 2018-11-26 ENCOUNTER — Telehealth: Payer: Self-pay | Admitting: Cardiovascular Disease

## 2018-11-26 NOTE — Telephone Encounter (Signed)
Pt c/o of Chest Pain: STAT if CP now or developed within 24 hours  1. Are you having CP right now? no  2. Are you experiencing any other symptoms (ex. SOB, nausea, vomiting, sweating)? Dizziness and both hands are numb, more left than right . Not feeling numbness, SOB,   3. How long have you been experiencing CP? Off and on, today more than usual  4. Is your CP continuous or coming and going? Comes and goes  5. Have you taken Nitroglycerin? Yes, today, and relieved her CP, states while she was talking to me she had more CP. ?

## 2018-11-26 NOTE — Telephone Encounter (Signed)
I spoke with the patient. She called today with complaints of increased SOB with walking- this has been progressively worse since summer. She has noticed some chest pain today that is occurring intermittently and is fleeting.  She has had an episode of left arm pain with numbness in her hand today.  She complains of some head congestion and feelings of "something pushing up on my diaphragm."  I have reviewed with the patient if symptoms are similar to her MI back in 2007- she states she had a lot of fullness in her chest at that time, but denies that now.  She has a history of a-fib but does not report her heart racing or variation in her HR.   She is concerned at her SOB/ decreased endurance. She states she ate tomato soup for lunch and is unsure if she has indigestion. She took a NTG this afternoon and belched a few times and felt better.   I have advised the patient that she has an array of symptoms and I am unsure if any are cardiac in nature.  I have offered her: 1) to be evaluated in the ER tonight for her current/ worsening symptoms 2) an e-visit with Dr. Rockey Situ tomorrow at 11:40 am.  Per the patient, she would like to take pantoprazole tonight that she has at home to see if this helps. She does not want to schedule an e-visit at this time. She would prefer to call back in the morning to follow up on her symptoms. I have advised that with her belching this afternoon and getting relief, this sounds more GI in nature, but I cannot be sure if her chest pain/ left arm pain/ and also reported recent diaphoresis are not cardiac in nature.  She is aware the slot for the e-visit may be gone by morning, but would still like to defer for now.   I have again advised her if symptoms worsen, then she needs to go to the ER this evening for further evaluation/ possible treatment.   She voices understanding of the above and is agreeable.

## 2018-11-26 NOTE — Telephone Encounter (Signed)
NA, no VM

## 2018-11-29 NOTE — Progress Notes (Signed)
Patient: Tracy Mann Female    DOB: 05/04/1933   85 y.o.   MRN: [id number] Visit Date: 12/02/2018  Today's Provider: Wilhemena Durie, MD   Chief Complaint  Patient presents with  . Diabetes  . Restless Leg  . Atrial Fibrillation    Follow Up PT/INR   Subjective:     HPI   Diabetes Mellitus Type II, Follow-up:   Lab Results  Component Value Date   HGBA1C 8.7 (A) 08/13/2018   HGBA1C 8.4 (A) 04/10/2018   HGBA1C 7.9 (A) 12/19/2017    Last seen for diabetes 3 months ago.  Management since then includes none. She reports excellent compliance with treatment. She is not having side effects.  Current symptoms include increase appetite, paresthesia of the feet and visual disturbances and have been unchanged. Home blood sugar records: patient reports it varies but states recent reading fasting was 170  Episodes of hypoglycemia? yes -    Current insulin regiment: Humalog inject 10-20units TID Most Recent Eye Exam: >1 year Weight trend: increasing steadily Prior visit with dietician: yes Current exercise: none Current diet habits: not asked  Pertinent Labs:    Component Value Date/Time   CHOL 210 (H) 09/17/2017 1130   TRIG 194 (H) 09/17/2017 1130   HDL 50 09/17/2017 1130   LDLCALC 121 (H) 09/17/2017 1130   CREATININE 0.78 08/29/2018 1102   CREATININE 0.94 01/23/2014 0406    Wt Readings from Last 3 Encounters:  10/08/18 235 lb 8 oz (106.8 kg)  09/10/18 235 lb (106.6 kg)  08/29/18 235 lb 8 oz (106.8 kg)    ------------------------------------------------------------------------  Follow up for Restless Leg Syndrome  The patient was last seen for this 3 months ago. Changes made at last visit include started patient on Gabapentin 114m.  She reports poor compliance with treatment. Patient states that she was on medication <7days, she reports that she was taking Ropinerole PRN but has stopped in the past month.  She is having side effects from  Gabapentin, patient reports that she had experienced nausea and confusion.   ------------------------------------------------------------------------------------  Follow Up Chronic A-Fib, patient last seen in office 09/10/18  Last INR 11/12/18 (2.9)  Arthritis Patient claims to be in terrible pain all the time.  She hurts "all over".  We discussed I think that she would be better off off of narcotics.  She states she cannot do without them. Chest tightness Patient has been cleared by cardiology.  She states once or twice a day she will have a cough that collects of green to yellow sputum.  She then feels better.  The chest tightness is not exertional.  She states she gets tired with any type of exertion but this is an ongoing chronic problem. Allergies  Allergen Reactions  . Other Anaphylaxis and Swelling    'tongue swelling'  . Sulfa Antibiotics Anaphylaxis  . Clarithromycin Other (See Comments)    Hives, headaches, hard time swelling, felt like throat was closing up Hives, headaches, hard time swelling, felt like throat was closing up  . Diphenhydramine Other (See Comments)  . Estrogens Conjugated   . Estrogens, Conjugated Other (See Comments)  . Sulfonamide Derivatives Swelling    'tongue swelling'  . Nitrofurantoin Nausea Only     Current Outpatient Medications:  .  ACCU-CHEK AVIVA PLUS test strip, Use with meter to check glucose before meals and at bedtime, Disp: 200 each, Rfl: 12 .  Coenzyme Q10 (CO Q 10 PO), Take by mouth.,  Disp: , Rfl:  .  COMBIVENT RESPIMAT 20-100 MCG/ACT AERS respimat, 1 puff every 6 (six) hours as needed. , Disp: , Rfl:  .  diltiazem (CARDIZEM) 30 MG tablet, TAKE 1 TABLET (30 MG TOTAL) BY MOUTH 3 (THREE) TIMES DAILY AS NEEDED., Disp: 270 tablet, Rfl: 0 .  ezetimibe (ZETIA) 10 MG tablet, TAKE 1 TABLET BY MOUTH EVERY DAY, Disp: 90 tablet, Rfl: 3 .  fluticasone (FLONASE) 50 MCG/ACT nasal spray, SPRAY TWO SPRAYS IN EACH NOSTRIL ONCE DAILY, Disp: 48 g, Rfl: 3  .  furosemide (LASIX) 20 MG tablet, Take 1 tablet (20 mg total) by mouth 2 (two) times daily as needed., Disp: 180 tablet, Rfl: 3 .  HYDROcodone-acetaminophen (NORCO) 10-325 MG tablet, Take 1 tablet by mouth every 6 (six) hours as needed., Disp: 120 tablet, Rfl: 0 .  HYDROcodone-acetaminophen (NORCO) 10-325 MG tablet, Take 1 tablet by mouth every 6 (six) hours as needed. (Patient not taking: Reported on 10/08/2018), Disp: 120 tablet, Rfl: 0 .  HYDROcodone-acetaminophen (NORCO) 10-325 MG tablet, Take 1 tablet by mouth every 6 (six) hours as needed. (Patient not taking: Reported on 10/08/2018), Disp: 120 tablet, Rfl: 0 .  insulin lispro (HUMALOG KWIKPEN) 100 UNIT/ML KwikPen, INJECT 10-20 UNITS THREE TIMES DAILY WITH EACH MEAL., Disp: 15 mL, Rfl: 12 .  Insulin Pen Needle (RELION PEN NEEDLE 31G/8MM) 31G X 8 MM MISC, Use with insulin pen three times daily, Disp: 100 each, Rfl: 12 .  levothyroxine (SYNTHROID) 125 MCG tablet, TAKE 1 TABLET (125 MCG TOTAL) BY MOUTH DAILY BEFORE BREAKFAST., Disp: 90 tablet, Rfl: 4 .  lisinopril (PRINIVIL,ZESTRIL) 2.5 MG tablet, TAKE 1 TABLET BY MOUTH EVERYDAY AT BEDTIME, Disp: 90 tablet, Rfl: 3 .  LORazepam (ATIVAN) 1 MG tablet, BID prn, Disp: 60 tablet, Rfl: 1 .  Magnesium 500 MG CAPS, Take 500 mg by mouth daily. , Disp: , Rfl:  .  nitrofurantoin, macrocrystal-monohydrate, (MACROBID) 100 MG capsule, Take 1 capsule (100 mg total) by mouth daily., Disp: 30 capsule, Rfl: 2 .  nitroGLYCERIN (NITROSTAT) 0.4 MG SL tablet, Place 1 tablet (0.4 mg total) under the tongue every 5 (five) minutes as needed for chest pain., Disp: 25 tablet, Rfl: 3 .  Vitamin D, Ergocalciferol, (DRISDOL) 50000 units CAPS capsule, Take 50,000 Units by mouth. , Disp: , Rfl:  .  warfarin (COUMADIN) 2 MG tablet, Take 1 tablet (2 mg total) by mouth daily., Disp: 30 tablet, Rfl: 3 .  warfarin (COUMADIN) 2.5 MG tablet, TAKE 1 TABLET BY MOUTH EVERY DAY, Disp: 30 tablet, Rfl: 5 .  warfarin (COUMADIN) 3 MG tablet, Take  1 tablet (3 mg total) by mouth daily., Disp: 30 tablet, Rfl: 12 .  warfarin (COUMADIN) 4 MG tablet, Take 1 tablet (4 mg total) by mouth daily., Disp: 30 tablet, Rfl: 12  Review of Systems  Constitutional: Negative.   HENT: Negative.   Eyes: Negative.   Respiratory: Positive for cough, chest tightness and wheezing.   Cardiovascular: Negative.   Gastrointestinal: Negative.   Endocrine: Negative.   Musculoskeletal: Positive for arthralgias and myalgias.  Allergic/Immunologic: Negative.   Hematological: Negative.   Psychiatric/Behavioral: The patient is nervous/anxious.     Social History   Tobacco Use  . Smoking status: Never Smoker  . Smokeless tobacco: Never Used  Substance Use Topics  . Alcohol use: No      Objective:   There were no vitals taken for this visit. There were no vitals filed for this visit.There is no height or weight on file to  calculate BMI.   Physical Exam Vitals signs reviewed.  Constitutional:      Appearance: She is well-developed. She is obese.  HENT:     Head: Normocephalic and atraumatic.     Right Ear: External ear normal.     Left Ear: External ear normal.     Nose: Nose normal.  Eyes:     General: No scleral icterus.    Conjunctiva/sclera: Conjunctivae normal.  Neck:     Thyroid: No thyromegaly.  Cardiovascular:     Rate and Rhythm: Normal rate and regular rhythm.     Heart sounds: Normal heart sounds.  Pulmonary:     Effort: Pulmonary effort is normal.     Breath sounds: Wheezing present.     Comments: Very mild expiratory wheezes mainly on the left side. Abdominal:     Palpations: Abdomen is soft.  Musculoskeletal:     Comments: 1+ LE  edema.  Skin:    General: Skin is warm and dry.     Comments: Very fair skin.  Significant AK's, especially arms and legs.    Neurological:     General: No focal deficit present.     Mental Status: She is alert and oriented to person, place, and time. Mental status is at baseline.  Psychiatric:         Mood and Affect: Mood normal.        Behavior: Behavior normal.        Thought Content: Thought content normal.        Judgment: Judgment normal.      Results for orders placed or performed in visit on 12/02/18  POCT INR  Result Value Ref Range   INR 3.3 (A) 2.0 - 3.0   Prothrombin Time 40.0        Assessment & Plan    1. Type 2 diabetes mellitus with other circulatory complication, with long-term current use of insulin (HCC) Morbid obesity is also a major problem for this patient.  Weight loss again encouraged. - Hemoglobin A1c  2. Chronic atrial fibrillation (HCC) INR slightly high today at 3.3.  Decrease Coumadin to 3 mg daily - POCT INR  3. Primary osteoarthritis involving multiple joints On her next visit we will begin to cut down on the number of pills per prescription by 10 per prescription.  This is something that we need to do for this 85 year old - HYDROcodone-acetaminophen (NORCO) 10-325 MG tablet; Take 1 tablet by mouth every 6 (six) hours as needed.  Dispense: 120 tablet; Refill: 0  4. Degeneration of lumbar or lumbosacral intervertebral disc   5. Mixed hyperlipidemia  - Lipid panel  6. Osteoarthritis, unspecified osteoarthritis type, unspecified site  - Sed Rate (ESR) - CK (Creatine Kinase)  7. Cough Obtain chest x-ray.  No indication for any antibiotics at this time. - DG Chest 2 View; Future     Richard Cranford Mon, MD  [facility]

## 2018-12-02 ENCOUNTER — Ambulatory Visit (INDEPENDENT_AMBULATORY_CARE_PROVIDER_SITE_OTHER): Payer: Medicare Other | Admitting: Family Medicine

## 2018-12-02 ENCOUNTER — Other Ambulatory Visit: Payer: Self-pay

## 2018-12-02 VITALS — BP 120/74 | HR 68 | Temp 97.1°F | Resp 15 | Wt 240.2 lb

## 2018-12-02 DIAGNOSIS — Z794 Long term (current) use of insulin: Secondary | ICD-10-CM

## 2018-12-02 DIAGNOSIS — E1159 Type 2 diabetes mellitus with other circulatory complications: Secondary | ICD-10-CM

## 2018-12-02 DIAGNOSIS — I482 Chronic atrial fibrillation, unspecified: Secondary | ICD-10-CM | POA: Diagnosis not present

## 2018-12-02 DIAGNOSIS — M5137 Other intervertebral disc degeneration, lumbosacral region: Secondary | ICD-10-CM | POA: Diagnosis not present

## 2018-12-02 DIAGNOSIS — E782 Mixed hyperlipidemia: Secondary | ICD-10-CM | POA: Diagnosis not present

## 2018-12-02 DIAGNOSIS — M1990 Unspecified osteoarthritis, unspecified site: Secondary | ICD-10-CM

## 2018-12-02 DIAGNOSIS — M8949 Other hypertrophic osteoarthropathy, multiple sites: Secondary | ICD-10-CM | POA: Diagnosis not present

## 2018-12-02 DIAGNOSIS — R059 Cough, unspecified: Secondary | ICD-10-CM

## 2018-12-02 DIAGNOSIS — R05 Cough: Secondary | ICD-10-CM

## 2018-12-02 DIAGNOSIS — M159 Polyosteoarthritis, unspecified: Secondary | ICD-10-CM

## 2018-12-02 LAB — POCT INR
INR: 3.3 — AB (ref 2.0–3.0)
Prothrombin Time: 40

## 2018-12-02 MED ORDER — HYDROCODONE-ACETAMINOPHEN 10-325 MG PO TABS: 1.0000 | ORAL_TABLET | Freq: Four times a day (QID) | ORAL | 0 refills | Status: DC | PRN

## 2018-12-02 NOTE — Patient Instructions (Addendum)
Description   Dx: Atrial Fibrillation Dose Change: 3mg  daily Return: 2 weeks    For Cough try taking Robitussin twice a day.

## 2018-12-04 ENCOUNTER — Ambulatory Visit
Admission: RE | Admit: 2018-12-04 | Discharge: 2018-12-04 | Disposition: A | Discharge status: Home / Self Care | Payer: Medicare Other | Attending: Family Medicine | Admitting: Family Medicine

## 2018-12-04 ENCOUNTER — Other Ambulatory Visit: Payer: Self-pay

## 2018-12-04 ENCOUNTER — Ambulatory Visit
Admission: RE | Admit: 2018-12-04 | Discharge: 2018-12-04 | Disposition: A | Discharge status: Home / Self Care | Payer: Medicare Other | Source: Ambulatory Visit | Attending: Family Medicine | Admitting: Family Medicine

## 2018-12-04 DIAGNOSIS — R05 Cough: Secondary | ICD-10-CM | POA: Diagnosis not present

## 2018-12-04 DIAGNOSIS — R059 Cough, unspecified: Secondary | ICD-10-CM

## 2018-12-05 ENCOUNTER — Telehealth: Payer: Self-pay

## 2018-12-05 DIAGNOSIS — I509 Heart failure, unspecified: Secondary | ICD-10-CM

## 2018-12-05 NOTE — Telephone Encounter (Signed)
Advised patient of results. Order was placed for referral back to Dr. Rockey Situ.

## 2018-12-05 NOTE — Telephone Encounter (Signed)
Please advise chest x-ray results? Results are available in chart.

## 2018-12-05 NOTE — Telephone Encounter (Signed)
Some heart failure--double lasix dose and refer back to cardiology.thanks

## 2018-12-05 NOTE — Telephone Encounter (Signed)
Patient calling requesting her chest x ray results. Please review result in chart.   [facility]

## 2018-12-08 NOTE — Progress Notes (Signed)
Cardiology Office Note  Date:  12/09/2018   ID:  Tracy Mann, [place] [birth year], MRN [id number]  PCP:  Jerrol Banana., MD   Chief Complaint  Patient presents with  . other    Follow up per Dr. Rosanna Randy for CHF. Meds reviewed by the pt. verbally. Pt. c/o chest pain, LE edema and shortness of breath.     HPI:  Tracy Mann is a 85 year old woman with a PMH significant for  chronic atrial fibrillation on Coumadin therapy,  diastolic dysfunction, EF 40 to 45% on echo 01/2018  hypertension,  hyperlipidemia,  morbid obesity,   moderate pulmonary hypertension inferior wall myocardial infarction felt to be secondary to a thrombus from atrial fib in October 2007 at [facility],   episodes of chest pain  who presents for followup Of her coronary artery disease and leg edema.  On today's visit reports worsening shortness of breath Previously reluctant to take Lasix secondary to bladder spasm/irritable bladder  More recently reports she has been taking Lasix twice a day, occasionally  3 times daily  Recent xray suggesting edema, results reviewed with her Legs are now more edematous, she is wrapping them herself Has to rest walking in the house  Tried some old protonix for soreness in [place] it may have helped ABD sore, feels distended  Legs weak Chronic lower extremity swelling, but perhaps worse recently Unable to get compression hose on  Gets bladder irritation with urination and previously reluctant to take diuretics but given the severity of symptoms has been taking more regularly now  Weight up 7 pounds from jan 2020  Lab work reviewed with her HBA1C 8.7  EKG personally reviewed by myself on todays visit shows atrial fibrillation with ventricular rate 88 bpm left bundle branch block, left anterior fascicular block, rare PVC  Monitor, reviewed in detail today Atrial fibrillation, 100% burden avg of 83 bpm  Other past medical history  reviewed In follow-up today, she reports that she had an episode of chest pain 05/21/2015 on the left side She went to the emergency room, was admitted. Hospital records reviewed. Cardiac enzymes were unrevealing, EKG unchanged. She had stress Myoview showing old inferior MI with no ischemia consistent with her known anatomy. She did not take nitroglycerin for her symptoms.  Previously seen by vein and vascular, Dr. Ronalee Belts, recommended to wear TED hose or ACE wraps It was mentioned that perhaps she could wear lymphedema compression pump.    PMH:   has a past medical history of Acute myocardial infarction of inferior wall ([place]), Arthritis, Chronic anticoagulation, Chronic atrial fibrillation ([place]), Chronic combined systolic and diastolic CHF (congestive heart failure) ([place]), Essential hypertension, Hyperlipidemia, mixed, Mixed Ischemic/Nonischemic Cardiomyopathy, Nonobstructive coronary artery disease, Obesity, Thyroid disease, Type II diabetes mellitus ([place]), Venous insufficiency, and Vitamin D deficiency.  PSH:    Past Surgical History:  Procedure Laterality Date  . CARDIAC CATHETERIZATION  11/2012   [facility];EF 45-50%  . CHOLECYSTECTOMY  1999  . TUBAL LIGATION  1968  . VESICOVAGINAL FISTULA CLOSURE W/ TAH  1996    Current Outpatient Medications  Medication Sig Dispense Refill  . ACCU-CHEK AVIVA PLUS test strip Use with meter to check glucose before meals and at bedtime 200 each 12  . Coenzyme Q10 (CO Q 10 PO) Take by mouth.    . COMBIVENT RESPIMAT 20-100 MCG/ACT AERS respimat 1 puff every 6 (six) hours as needed.     . diltiazem (CARDIZEM) 30 MG tablet TAKE 1 TABLET (30  MG TOTAL) BY MOUTH 3 (THREE) TIMES DAILY AS NEEDED. 270 tablet 0  . ezetimibe (ZETIA) 10 MG tablet TAKE 1 TABLET BY MOUTH EVERY DAY 90 tablet 3  . fluticasone (FLONASE) 50 MCG/ACT nasal spray SPRAY TWO SPRAYS IN EACH NOSTRIL ONCE DAILY 48 g 3  . furosemide (LASIX) 20 MG tablet Take 1 tablet (20 mg total) by mouth 2  (two) times daily as needed. 180 tablet 3  . HYDROcodone-acetaminophen (NORCO) 10-325 MG tablet Take 1 tablet by mouth every 6 (six) hours as needed. 120 tablet 0  . insulin lispro (HUMALOG KWIKPEN) 100 UNIT/ML KwikPen INJECT 10-20 UNITS THREE TIMES DAILY WITH EACH MEAL. 15 mL 12  . Insulin Pen Needle (RELION PEN NEEDLE 31G/8MM) 31G X 8 MM MISC Use with insulin pen three times daily 100 each 12  . levothyroxine (SYNTHROID) 125 MCG tablet TAKE 1 TABLET (125 MCG TOTAL) BY MOUTH DAILY BEFORE BREAKFAST. 90 tablet 4  . lisinopril (PRINIVIL,ZESTRIL) 2.5 MG tablet TAKE 1 TABLET BY MOUTH EVERYDAY AT BEDTIME 90 tablet 3  . LORazepam (ATIVAN) 1 MG tablet BID prn 60 tablet 1  . Magnesium 500 MG CAPS Take 500 mg by mouth daily.     . nitrofurantoin, macrocrystal-monohydrate, (MACROBID) 100 MG capsule Take 1 capsule (100 mg total) by mouth daily. 30 capsule 2  . nitroGLYCERIN (NITROSTAT) 0.4 MG SL tablet Place 1 tablet (0.4 mg total) under the tongue every 5 (five) minutes as needed for chest pain. 25 tablet 3  . Vitamin D, Ergocalciferol, (DRISDOL) 50000 units CAPS capsule Take 50,000 Units by mouth.     . warfarin (COUMADIN) 2 MG tablet Take 1 tablet (2 mg total) by mouth daily. 30 tablet 3  . warfarin (COUMADIN) 2.5 MG tablet TAKE 1 TABLET BY MOUTH EVERY DAY 30 tablet 5  . warfarin (COUMADIN) 3 MG tablet Take 1 tablet (3 mg total) by mouth daily. 30 tablet 12  . warfarin (COUMADIN) 4 MG tablet Take 1 tablet (4 mg total) by mouth daily. 30 tablet 12  . HYDROcodone-acetaminophen (NORCO) 10-325 MG tablet Take 1 tablet by mouth every 6 (six) hours as needed. (Patient not taking: Reported on 12/09/2018) 120 tablet 0  . HYDROcodone-acetaminophen (NORCO) 10-325 MG tablet Take 1 tablet by mouth every 6 (six) hours as needed. (Patient not taking: Reported on 12/09/2018) 120 tablet 0   No current facility-administered medications for this visit.      Allergies:   Other; Sulfa antibiotics; Clarithromycin;  Diphenhydramine; Estrogens conjugated; Estrogens, conjugated; Sulfonamide derivatives; and Nitrofurantoin   Social History:  The patient  reports that she has never smoked. She has never used smokeless tobacco. She reports that she does not drink alcohol or use drugs.   Family History:   family history includes Heart disease in her mother; Thyroid disease in her father.    Review of Systems: Review of Systems  Constitutional: Negative.   HENT: Negative.   Respiratory: Positive for shortness of breath.   Cardiovascular: Positive for leg swelling.  Gastrointestinal: Negative.   Musculoskeletal: Positive for joint pain.  Neurological: Negative.   Psychiatric/Behavioral: Negative.   All other systems reviewed and are negative.   PHYSICAL EXAM: VS:  BP 130/80 (BP Location: Left Arm, Patient Position: Sitting, Cuff Size: Large)   Pulse 88   Temp (!) 97.3 F (36.3 C)   Ht 5\' 5"  (1.651 m)   Wt 239 lb (108.4 kg)   SpO2 95%   BMI 39.77 kg/m  , BMI Body mass index is 39.77  kg/m.  Constitutional:  oriented to person, place, and time. No distress.  Obese HENT:  Head: Grossly normal Eyes:  no discharge. No scleral icterus.  Neck: Unable to estimate JVD, no carotid bruits  Cardiovascular: Regular rate and rhythm, no murmurs appreciated Leg wraps in place, at least 1+ edema to the thighs Pulmonary/Chest: Clear to auscultation bilaterally, no wheezes or rails Abdominal: Soft.  no distension.  no tenderness.  Musculoskeletal: Normal range of motion Neurological:  normal muscle tone. Coordination normal. No atrophy Skin: Skin warm and dry Psychiatric: normal affect, pleasant   Recent Labs: 02/26/2018: BNP 65.9 04/30/2018: NT-Pro BNP 531 08/29/2018: ALT 13; BUN 18; Creatinine, Ser 0.78; Hemoglobin 13.5; Magnesium 2.1; Platelets 225; Potassium 4.9; Sodium 139; TSH 0.888    Lipid Panel Lab Results  Component Value Date   CHOL 210 (H) 09/17/2017   HDL 50 09/17/2017   LDLCALC 121 (H)  09/17/2017   TRIG 194 (H) 09/17/2017      Wt Readings from Last 3 Encounters:  12/09/18 239 lb (108.4 kg)  12/02/18 240 lb 3.2 oz (109 kg)  10/08/18 235 lb 8 oz (106.8 kg)     ASSESSMENT AND PLAN: Chest pain Denies having any chest pain at this time No further testing  Acute on chronic diastolic CHF Symptoms exacerbated by morbid obesity High fluid intake, previously reluctant to take her Lasix We recommend she stop Lasix and change to torsemide 20 twice daily Strict guidance given concerning fluid intake, less than 1.5 L/day Weight is likely at least 10 pounds above her baseline, should be less than 230, suspect to 25 or less Recommended daily weights and call our office if weight does not start to improve Abdominal swelling is likely from fluid retention  Mixed hyperlipidemia Unable to tolerate statins, tolerating zetia  Refilled  Essential hypertension - Plan: EKG 12-Lead Blood pressure is well controlled on today's visit. No changes made to the medications.  Atherosclerosis of native coronary artery of native heart without angina pectoris Prior inferior MI No significant change in EKG Echo showing stable ejection fraction 45% earlier this year Stressed importance of staying on her Lasix  Mixed Ischemic/Nonischemic Cardiomyopathy - Plan: EKG 12-Lead Torsemide 20 twice daily, leg elevation, wraps Did not seem particularly interested in lymphedema compression pumps.  Previously seen by Dr. Ronalee Belts  Chronic atrial fibrillation [facility]) - Plan: EKG 12-Lead Tolerating warfarin, rate well controlled Bradycardia on metoprolol Arrhythmia likely contributing to fluid retention  Obstructive sleep apnea Stressed importance of staying on CPAP  Type 2 diabetes mellitus without complication, with long-term current use of insulin ([place]) Managed by primary care and endocrine HBA1C greater than 8 Strict diet recommended  PVCs Asymptomatic, no further work-up needed Seen again  today on EKG  Pauses Likely secondary to sleep apnea, not wearing her CPAP Has 4-second pauses sparingly 3 in 2 weeks time when sleeping Stressed importance of wearing her CPAP Asymptomatic on today's visit   Total encounter time more than 25 minutes  Greater than 50% was spent in counseling and coordination of care with the patient  Follow-up 3 weeks   Total encounter time more than 25 minutes  Greater than 50% was spent in counseling and coordination of care with the patient    Orders Placed This Encounter  Procedures  . EKG 12-Lead     Signed, Esmond Plants, M.D., Ph.D. 12/09/2018  [place], [place]

## 2018-12-09 ENCOUNTER — Encounter: Payer: Self-pay | Admitting: Cardiovascular Disease

## 2018-12-09 ENCOUNTER — Telehealth: Payer: Self-pay | Admitting: Cardiovascular Disease

## 2018-12-09 ENCOUNTER — Other Ambulatory Visit: Payer: Self-pay

## 2018-12-09 ENCOUNTER — Ambulatory Visit (INDEPENDENT_AMBULATORY_CARE_PROVIDER_SITE_OTHER): Payer: Medicare Other | Admitting: Cardiovascular Disease

## 2018-12-09 VITALS — BP 130/80 | HR 88 | Temp 97.3°F | Ht 65.0 in | Wt 239.0 lb

## 2018-12-09 DIAGNOSIS — E782 Mixed hyperlipidemia: Secondary | ICD-10-CM

## 2018-12-09 DIAGNOSIS — I482 Chronic atrial fibrillation, unspecified: Secondary | ICD-10-CM

## 2018-12-09 DIAGNOSIS — R0609 Other forms of dyspnea: Secondary | ICD-10-CM

## 2018-12-09 DIAGNOSIS — R0600 Dyspnea, unspecified: Secondary | ICD-10-CM | POA: Diagnosis not present

## 2018-12-09 DIAGNOSIS — Z794 Long term (current) use of insulin: Secondary | ICD-10-CM

## 2018-12-09 DIAGNOSIS — I5042 Chronic combined systolic (congestive) and diastolic (congestive) heart failure: Secondary | ICD-10-CM | POA: Diagnosis not present

## 2018-12-09 DIAGNOSIS — E1159 Type 2 diabetes mellitus with other circulatory complications: Secondary | ICD-10-CM | POA: Diagnosis not present

## 2018-12-09 DIAGNOSIS — I25118 Atherosclerotic heart disease of native coronary artery with other forms of angina pectoris: Secondary | ICD-10-CM | POA: Diagnosis not present

## 2018-12-09 DIAGNOSIS — R5382 Chronic fatigue, unspecified: Secondary | ICD-10-CM

## 2018-12-09 MED ORDER — PANTOPRAZOLE SODIUM 20 MG PO TBEC: 20.0000 mg | DELAYED_RELEASE_TABLET | Freq: Every day | ORAL | 6 refills | Status: DC

## 2018-12-09 MED ORDER — TORSEMIDE 20 MG PO TABS: 20.0000 mg | ORAL_TABLET | Freq: Two times a day (BID) | ORAL | 3 refills | Status: DC

## 2018-12-09 MED ORDER — PANTOPRAZOLE SODIUM 40 MG PO TBEC: 40.0000 mg | DELAYED_RELEASE_TABLET | Freq: Every day | ORAL | 6 refills | Status: DC

## 2018-12-09 NOTE — Telephone Encounter (Signed)
lmov to schedule 3 wk fu per checkout 12/09/18 [facility]

## 2018-12-09 NOTE — Patient Instructions (Addendum)
Medication Instructions:  - Your physician has recommended you make the following change in your medication:   1) Start protonix 20 mg- take 1 tablet by mouth once daily  2) Stop the lasix  3) Start torsemide 20 mg- take 1 tablet by mouth twice a day for edema  - Decrease you fluid intake (max should be 1.5 liters a day)  - Ideal weight 230 pounds  If you need a refill on your cardiac medications before your next appointment, please call your pharmacy.    Lab work: No new labs needed   If you have labs (blood work) drawn today and your tests are completely normal, you will receive your results only by: Marland Kitchen MyChart Message (if you have MyChart) OR . A paper copy in the mail If you have any lab test that is abnormal or we need to change your treatment, we will call you to review the results.   Testing/Procedures: No new testing needed   Follow-Up: At [facility], you and your health needs are our priority.  As part of our continuing mission to provide you with exceptional heart care, we have created designated Provider Care Teams.  These Care Teams include your primary Cardiologist (physician) and Advanced Practice Providers (APPs -  Physician Assistants and Nurse Practitioners) who all work together to provide you with the care you need, when you need it.  . You will need a follow up appointment in 3 weeks  . Providers on your designated Care Team:   . Murray Hodgkins, NP . Christell Faith, PA-C . Marrianne Mood, PA-C  Any Other Special Instructions Will Be Listed Below (If Applicable).  For educational health videos Log in to : [email or web address] Or : [email or web address], password : [email or web address]

## 2018-12-12 NOTE — Telephone Encounter (Signed)
lmov to schedule  °

## 2018-12-17 ENCOUNTER — Ambulatory Visit (INDEPENDENT_AMBULATORY_CARE_PROVIDER_SITE_OTHER): Payer: Medicare Other

## 2018-12-17 ENCOUNTER — Other Ambulatory Visit: Payer: Self-pay

## 2018-12-17 DIAGNOSIS — I482 Chronic atrial fibrillation, unspecified: Secondary | ICD-10-CM

## 2018-12-17 LAB — POCT INR
Est. average glucose Bld gHb Est-mCnc: 18.7
INR: 1.6 — AB (ref 2.0–3.0)

## 2018-12-17 NOTE — Patient Instructions (Signed)
Description   Dx: Atrial Fibrillation Current Dose: 3mg  daily Changes: 3 mg daily except 4 mg M-W-F. Return: 2 weeks

## 2018-12-28 ENCOUNTER — Other Ambulatory Visit: Payer: Self-pay | Admitting: Family Medicine

## 2018-12-28 NOTE — Telephone Encounter (Signed)
Requested medication (s) are due for refill today: no  Requested medication (s) are on the active medication list:no  Last refill:  07/30/2018  Future visit scheduled: yes  Notes to clinic: medication was discontinued   Requested Prescriptions  Pending Prescriptions Disp Refills   estradiol (ESTRACE) 0.1 MG/GM vaginal cream [Pharmacy Med Name: ESTRADIOL 0.01% CREAM] 42.5 g 0    Sig: Place 1 Applicatorful vaginally at bedtime.     OB/GYN:  Estrogens Failed - 12/28/2018 11:57 AM      Failed - Mammogram is up-to-date per Health Maintenance      Passed - Last BP in normal range    BP Readings from Last 1 Encounters:  12/09/18 130/80         Passed - Valid encounter within last 12 months    Recent Outpatient Visits          3 weeks ago Type 2 diabetes mellitus with other circulatory complication, with long-term current use of insulin [facility])   [facility] Jerrol Banana., MD   3 months ago Chronic atrial fibrillation   [facility] Jerrol Banana., MD   4 months ago Cellulitis and abscess of leg   [facility] Jerrol Banana., MD   4 months ago Permanent atrial fibrillation   [facility] Jerrol Banana., MD   5 months ago Permanent atrial fibrillation   [facility] Jerrol Banana., MD      Future Appointments            In 3 days [name], Kathlene November, MD [facility]   In 3 months Jerrol Banana., MD [facility], [place]

## 2018-12-30 NOTE — Telephone Encounter (Signed)
Please advise refill? Medication is not in med list. 

## 2018-12-30 NOTE — Progress Notes (Signed)
Cardiology Office Note  Date:  12/31/2018   ID:  [place], [place] 02/01/1933, MRN [id number]  PCP:  Jerrol Banana., MD   Chief Complaint  Patient presents with  . other    3 wk f/u c/o fatigue, weakness and sob. Meds reviewed verbally with pt.    HPI:  Tracy Mann is a 85 year old woman with a PMH significant for  chronic atrial fibrillation on Coumadin therapy,  diastolic dysfunction, EF 40 to 45% on echo 01/2018  hypertension,  hyperlipidemia,  morbid obesity,   moderate pulmonary hypertension inferior wall myocardial infarction felt to be secondary to a thrombus from atrial fib in October 2007 at [facility],   episodes of chest pain  who presents for followup Of her coronary artery disease and leg edema.  In follow-up today she reports that shortness of breath is improving Reports eating less, Tried torsemide for several days but it caused worsening urethral irritation and she went back to Lasix 20 daily Weight down 13 pounds since early 11/20  She is concerned about Migrating arthalgias Wonders if she needs to see rheumatology Was talking to a nurse from the insurance company Leg pain below the knees,  She continues to have severe lower extremity edema  Not very active, concerned that her weight is over 200 pounds No regular exercise, Does not know how to get her strength back  Previous x-ray reviewed with her showing pulmonary edema beginning of November 2020  Unable to wear compression hose  EKG personally reviewed by myself on todays visit Shows atrial fibrillation ventricular rate 90 bpm intraventricular conduction delay  Lab work reviewed with her HBA1C 8.7  Monitor, reviewed in detail today Atrial fibrillation, 100% burden avg of 83 bpm  Other past medical history reviewed In follow-up today, she reports that she had an episode of chest pain 05/21/2015 on the left side She went to the emergency room, was admitted. Hospital  records reviewed. Cardiac enzymes were unrevealing, EKG unchanged. She had stress Myoview showing old inferior MI with no ischemia consistent with her known anatomy. She did not take nitroglycerin for her symptoms.  Previously seen by vein and vascular, Dr. Ronalee Belts, recommended to wear TED hose or ACE wraps It was mentioned that perhaps she could wear lymphedema compression pump.    PMH:   has a past medical history of Acute myocardial infarction of inferior wall ([place]), Arthritis, Chronic anticoagulation, Chronic atrial fibrillation ([place]), Chronic combined systolic and diastolic CHF (congestive heart failure) ([place]), Essential hypertension, Hyperlipidemia, mixed, Mixed Ischemic/Nonischemic Cardiomyopathy, Nonobstructive coronary artery disease, Obesity, Thyroid disease, Type II diabetes mellitus ([place]), Venous insufficiency, and Vitamin D deficiency.  PSH:    Past Surgical History:  Procedure Laterality Date  . CARDIAC CATHETERIZATION  11/2012   [facility];EF 45-50%  . CHOLECYSTECTOMY  1999  . TUBAL LIGATION  1968  . VESICOVAGINAL FISTULA CLOSURE W/ TAH  1996    Current Outpatient Medications  Medication Sig Dispense Refill  . ACCU-CHEK AVIVA PLUS test strip Use with meter to check glucose before meals and at bedtime 200 each 12  . Coenzyme Q10 (CO Q 10 PO) Take by mouth.    . diltiazem (CARDIZEM) 30 MG tablet TAKE 1 TABLET (30 MG TOTAL) BY MOUTH 3 (THREE) TIMES DAILY AS NEEDED. 270 tablet 0  . ezetimibe (ZETIA) 10 MG tablet TAKE 1 TABLET BY MOUTH EVERY DAY 90 tablet 3  . fluticasone (FLONASE) 50 MCG/ACT nasal spray SPRAY TWO SPRAYS IN EACH NOSTRIL ONCE DAILY  48 g 3  . HYDROcodone-acetaminophen (NORCO) 10-325 MG tablet Take 1 tablet by mouth every 6 (six) hours as needed. 120 tablet 0  . insulin lispro (HUMALOG KWIKPEN) 100 UNIT/ML KwikPen INJECT 10-20 UNITS THREE TIMES DAILY WITH EACH MEAL. 15 mL 12  . Insulin Pen Needle (RELION PEN NEEDLE 31G/8MM) 31G X 8 MM MISC Use with insulin pen three  times daily 100 each 12  . levothyroxine (SYNTHROID) 125 MCG tablet TAKE 1 TABLET (125 MCG TOTAL) BY MOUTH DAILY BEFORE BREAKFAST. 90 tablet 4  . lisinopril (PRINIVIL,ZESTRIL) 2.5 MG tablet TAKE 1 TABLET BY MOUTH EVERYDAY AT BEDTIME 90 tablet 3  . LORazepam (ATIVAN) 1 MG tablet BID prn 60 tablet 1  . Magnesium 500 MG CAPS Take 500 mg by mouth daily.     . nitroGLYCERIN (NITROSTAT) 0.4 MG SL tablet Place 1 tablet (0.4 mg total) under the tongue every 5 (five) minutes as needed for chest pain. 25 tablet 3  . pantoprazole (PROTONIX) 20 MG tablet Take 1 tablet (20 mg total) by mouth daily. 30 tablet 6  . torsemide (DEMADEX) 20 MG tablet Take 1 tablet (20 mg total) by mouth 2 (two) times daily. 60 tablet 3  . warfarin (COUMADIN) 2 MG tablet Take 1 tablet (2 mg total) by mouth daily. 30 tablet 3  . warfarin (COUMADIN) 2.5 MG tablet TAKE 1 TABLET BY MOUTH EVERY DAY 30 tablet 5  . warfarin (COUMADIN) 3 MG tablet Take 1 tablet (3 mg total) by mouth daily. 30 tablet 12  . warfarin (COUMADIN) 4 MG tablet Take 1 tablet (4 mg total) by mouth daily. 30 tablet 12  . COMBIVENT RESPIMAT 20-100 MCG/ACT AERS respimat 1 puff every 6 (six) hours as needed.     Marland Kitchen HYDROcodone-acetaminophen (NORCO) 10-325 MG tablet Take 1 tablet by mouth every 6 (six) hours as needed. (Patient not taking: Reported on 12/09/2018) 120 tablet 0  . HYDROcodone-acetaminophen (NORCO) 10-325 MG tablet Take 1 tablet by mouth every 6 (six) hours as needed. (Patient not taking: Reported on 12/31/2018) 120 tablet 0  . nitrofurantoin, macrocrystal-monohydrate, (MACROBID) 100 MG capsule Take 1 capsule (100 mg total) by mouth daily. (Patient not taking: Reported on 12/31/2018) 30 capsule 2  . Vitamin D, Ergocalciferol, (DRISDOL) 50000 units CAPS capsule Take 50,000 Units by mouth.      No current facility-administered medications for this visit.     Allergies:   Other; Sulfa antibiotics; Clarithromycin; Diphenhydramine; Estrogens conjugated; Estrogens,  conjugated; Sulfonamide derivatives; and Nitrofurantoin   Social History:  The patient  reports that she has never smoked. She has never used smokeless tobacco. She reports that she does not drink alcohol or use drugs.   Family History:   family history includes Heart disease in her mother; Thyroid disease in her father.    Review of Systems: Review of Systems  Constitutional: Negative.   HENT: Negative.   Respiratory: Negative.   Cardiovascular: Positive for leg swelling.  Gastrointestinal: Negative.   Musculoskeletal: Positive for joint pain.  Neurological: Negative.   Psychiatric/Behavioral: Negative.   All other systems reviewed and are negative.   PHYSICAL EXAM: VS:  BP 120/60 (BP Location: Left Arm, Patient Position: Sitting, Cuff Size: Large)   Pulse 90   Ht 5\' 5"  (1.651 m)   Wt 227 lb 4 oz (103.1 kg)   SpO2 96%   BMI 37.82 kg/m  , BMI Body mass index is 37.82 kg/m.  Constitutional:  oriented to person, place, and time. No distress.  Obese HENT:  Head: Grossly normal Eyes:  no discharge. No scleral icterus.  Neck: No JVD, no carotid bruits  Cardiovascular:  irregularly irregular no murmurs appreciated 2+ pitting edema below the knees Pulmonary/Chest: Clear to auscultation bilaterally, no wheezes or rails Abdominal: Soft.  no distension.  no tenderness.  Musculoskeletal: Normal range of motion Neurological:  normal muscle tone. Coordination normal. No atrophy Skin: Skin warm and dry Psychiatric: normal affect, pleasant   Recent Labs: 02/26/2018: BNP 65.9 04/30/2018: NT-Pro BNP 531 08/29/2018: ALT 13; BUN 18; Creatinine, Ser 0.78; Hemoglobin 13.5; Magnesium 2.1; Platelets 225; Potassium 4.9; Sodium 139; TSH 0.888    Lipid Panel Lab Results  Component Value Date   CHOL 210 (H) 09/17/2017   HDL 50 09/17/2017   LDLCALC 121 (H) 09/17/2017   TRIG 194 (H) 09/17/2017      Wt Readings from Last 3 Encounters:  12/31/18 227 lb 4 oz (103.1 kg)  12/09/18 239 lb  (108.4 kg)  12/02/18 240 lb 3.2 oz (109 kg)     ASSESSMENT AND PLAN: Chest pain Denies having any chest pain at this time No further testing  Acute on chronic diastolic CHF Did not tolerate torsemide secondary to bladder/urethral irritation Recommend she stay on Lasix 20 daily, try for 20 twice daily Down 13 pounds from prior clinic visit by taking Lasix consistently And moderating her salt and fluid intake Still with significant leg edema that is likely painful, noted below the knees bilaterally Currently taking pain medications for pain from the swelling Unable to exclude arthritic issue causing her pain Would hope we can improve her pain with continued diuresis  Mixed hyperlipidemia Continue Zetia Statin intolerance  Essential hypertension - Plan: EKG 12-Lead No changes to her blood pressure medication  Atherosclerosis of native coronary artery of native heart without angina pectoris Prior inferior MI Echo showing stable ejection fraction 45%  No further ischemic work-up at this time  Mixed Ischemic/Nonischemic Cardiomyopathy - Plan: EKG 12-Lead On last clinic visit,  not seem particularly interested in lymphedema compression pumps.  Previously seen by Dr. Ronalee Belts -Recommend we continue Lasix 20 daily if not 20 twice daily if tolerated  Chronic atrial fibrillation ([place]) - Plan: EKG 12-Lead Tolerating warfarin, rate well controlled Bradycardia on metoprolol Rate is well controlled  Obstructive sleep apnea Stressed importance of staying on CPAP  Type 2 diabetes mellitus without complication, with long-term current use of insulin ([place]) Managed by primary care and endocrine HBA1C greater than 8 Reports that she is eating less  PVCs Asymptomatic, seen on EKG today  Pauses Likely secondary to sleep apnea, not wearing her CPAP Has 4-second pauses sparingly 3 in 2 weeks time when sleeping Stressed importance of wearing her CPAP No near syncope   Total encounter  time more than 25 minutes  Greater than 50% was spent in counseling and coordination of care with the patient  Follow-up 3 weeks   Total encounter time more than 25 minutes  Greater than 50% was spent in counseling and coordination of care with the patient    Orders Placed This Encounter  Procedures  . EKG 12-Lead     Signed, Esmond Plants, M.D., Ph.D. 12/31/2018  [place], [place]

## 2018-12-31 ENCOUNTER — Ambulatory Visit: Payer: Self-pay

## 2018-12-31 ENCOUNTER — Ambulatory Visit (INDEPENDENT_AMBULATORY_CARE_PROVIDER_SITE_OTHER): Payer: Medicare Other | Admitting: Cardiovascular Disease

## 2018-12-31 ENCOUNTER — Encounter: Payer: Self-pay | Admitting: Cardiovascular Disease

## 2018-12-31 ENCOUNTER — Other Ambulatory Visit: Payer: Self-pay

## 2018-12-31 VITALS — BP 120/60 | HR 90 | Ht 65.0 in | Wt 227.2 lb

## 2018-12-31 DIAGNOSIS — R5382 Chronic fatigue, unspecified: Secondary | ICD-10-CM

## 2018-12-31 DIAGNOSIS — I5042 Chronic combined systolic (congestive) and diastolic (congestive) heart failure: Secondary | ICD-10-CM | POA: Diagnosis not present

## 2018-12-31 DIAGNOSIS — R0609 Other forms of dyspnea: Secondary | ICD-10-CM

## 2018-12-31 DIAGNOSIS — E782 Mixed hyperlipidemia: Secondary | ICD-10-CM

## 2018-12-31 DIAGNOSIS — I482 Chronic atrial fibrillation, unspecified: Secondary | ICD-10-CM

## 2018-12-31 DIAGNOSIS — R0600 Dyspnea, unspecified: Secondary | ICD-10-CM

## 2018-12-31 DIAGNOSIS — E1159 Type 2 diabetes mellitus with other circulatory complications: Secondary | ICD-10-CM

## 2018-12-31 DIAGNOSIS — Z794 Long term (current) use of insulin: Secondary | ICD-10-CM

## 2018-12-31 DIAGNOSIS — R0602 Shortness of breath: Secondary | ICD-10-CM

## 2018-12-31 DIAGNOSIS — I25118 Atherosclerotic heart disease of native coronary artery with other forms of angina pectoris: Secondary | ICD-10-CM

## 2018-12-31 MED ORDER — FUROSEMIDE 20 MG PO TABS: 20.0000 mg | ORAL_TABLET | Freq: Two times a day (BID) | ORAL | 3 refills | Status: DC

## 2018-12-31 NOTE — Patient Instructions (Addendum)
Pain clinic : Dr. Holley Raring:  2nd floor phone number:  [phone]  Urology on first floor: [phone] Try estrogen cream  Call if you would like a referral to [place] physical therapy   Medication Instructions:  Try lasix daily with twice a day as tolerated  If you need a refill on your cardiac medications before your next appointment, please call your pharmacy.    Lab work: No new labs needed   If you have labs (blood work) drawn today and your tests are completely normal, you will receive your results only by: Marland Kitchen MyChart Message (if you have MyChart) OR . A paper copy in the mail If you have any lab test that is abnormal or we need to change your treatment, we will call you to review the results.   Testing/Procedures: No new testing needed   Follow-Up: At [facility], you and your health needs are our priority.  As part of our continuing mission to provide you with exceptional heart care, we have created designated Provider Care Teams.  These Care Teams include your primary Cardiologist (physician) and Advanced Practice Providers (APPs -  Physician Assistants and Nurse Practitioners) who all work together to provide you with the care you need, when you need it.  . You will need a follow up appointment in 3 months   . Providers on your designated Care Team:   . Murray Hodgkins, NP . Christell Faith, PA-C . Marrianne Mood, PA-C  Any Other Special Instructions Will Be Listed Below (If Applicable).  For educational health videos Log in to : [email or web address] Or : [email or web address], password : [email or web address]

## 2019-01-03 ENCOUNTER — Other Ambulatory Visit: Payer: Self-pay

## 2019-01-03 ENCOUNTER — Ambulatory Visit: Payer: Medicare Other | Admitting: Physician Assistant

## 2019-01-03 ENCOUNTER — Other Ambulatory Visit: Payer: Self-pay | Admitting: Family Medicine

## 2019-01-03 ENCOUNTER — Encounter: Payer: Self-pay | Admitting: Family Medicine

## 2019-01-03 ENCOUNTER — Ambulatory Visit (INDEPENDENT_AMBULATORY_CARE_PROVIDER_SITE_OTHER): Payer: Medicare Other | Admitting: Family Medicine

## 2019-01-03 VITALS — BP 152/79 | HR 93 | Temp 97.1°F | Wt 230.2 lb

## 2019-01-03 DIAGNOSIS — E1159 Type 2 diabetes mellitus with other circulatory complications: Secondary | ICD-10-CM | POA: Diagnosis not present

## 2019-01-03 DIAGNOSIS — B372 Candidiasis of skin and nail: Secondary | ICD-10-CM

## 2019-01-03 DIAGNOSIS — R300 Dysuria: Secondary | ICD-10-CM | POA: Diagnosis not present

## 2019-01-03 DIAGNOSIS — Z794 Long term (current) use of insulin: Secondary | ICD-10-CM

## 2019-01-03 LAB — POCT URINALYSIS DIPSTICK
Bilirubin, UA: NEGATIVE
Glucose, UA: POSITIVE — AB
Ketones, UA: NEGATIVE
Nitrite, UA: POSITIVE
Protein, UA: POSITIVE — AB
Spec Grav, UA: 1.025 (ref 1.010–1.025)
Urobilinogen, UA: 0.2 E.U./dL
pH, UA: 6 (ref 5.0–8.0)

## 2019-01-03 MED ORDER — AMOXICILLIN-POT CLAVULANATE 875-125 MG PO TABS: 1.0000 | ORAL_TABLET | Freq: Two times a day (BID) | ORAL | 0 refills | Status: DC

## 2019-01-03 MED ORDER — CLOTRIMAZOLE-BETAMETHASONE 1-0.05 % EX CREA: 1.0000 "application " | TOPICAL_CREAM | Freq: Two times a day (BID) | CUTANEOUS | 0 refills | Status: DC

## 2019-01-03 NOTE — Progress Notes (Signed)
Patient: Tracy Mann Female    DOB: [birth year]   85 y.o.   MRN: [id number] Visit Date: 01/03/2019  Today's Provider: Vernie Murders, PA   Chief Complaint  Patient presents with  . Urinary Tract Infection   Subjective:     Dysuria  This is a new problem. Episode onset: 10 days ago. The problem occurs every urination. The problem has been unchanged. The quality of the pain is described as burning. Associated symptoms include frequency. Pertinent negatives include no hematuria. Associated symptoms comments: weakness and fatigue. She has tried nothing for the symptoms.   Past Medical History:  Diagnosis Date  . Acute myocardial infarction of inferior wall ([place])    a. 10/2005 - Presumed to be 2/2 to a coronary embolus in setting of persistent Afib-->Cath 2014 relatively nl cors, EF 45-50% w/ severe distal inferior/apical inferior HK w/ aneursymal appearance.  . Arthritis   . Chronic anticoagulation    a. warfarin.  . Chronic atrial fibrillation (H[facility])    a. CHA2DS2VASc = 6--> warfarin.  . Chronic combined systolic and diastolic CHF (congestive heart failure) ([place])    a. 10/2012 Echo: EF 35-40%, diff HK, mild MR, mild biatrial enlargement;  b. 11/2012 EF 45-50% by LV gram; c. echo 07/2014: EF 40-45%, mod conc LVH, mod MR, severe biatrial enlargement, PASP 45 mm Hg c. Echo 01/2018 EF 45-50%, duffuse hypokinesis, mild MR, LA mod dilated, norm RVSF, dilated IVC  . Essential hypertension   . Hyperlipidemia, mixed   . Mixed Ischemic/Nonischemic Cardiomyopathy    a. 10/2012 Echo: EF 35-40%;  b. 11/2012 EF 45-50% by LV gram.  . Nonobstructive coronary artery disease    a. 2007 inferior wall MI thought to be secondary to thrombus from atrial fib b. 2014 cath with R dominant coronary arterial system with mild luminal irregularities c. Myoview 05/2015 old inferior MI, no new ischemic changes  . Obesity   . Thyroid disease    hypothyroidism  . Type II diabetes mellitus ([place])    a. 07/2018  A1c 8.7 - long term insulin use  . Venous insufficiency   . Vitamin D deficiency    Past Surgical History:  Procedure Laterality Date  . CARDIAC CATHETERIZATION  11/2012   [facility];EF 45-50%  . CHOLECYSTECTOMY  1999  . TUBAL LIGATION  1968  . VESICOVAGINAL FISTULA CLOSURE W/ TAH  1996   Family History  Problem Relation Age of Onset  . Heart disease Mother   . Thyroid disease Father    Allergies  Allergen Reactions  . Other Anaphylaxis and Swelling    'tongue swelling'  . Sulfa Antibiotics Anaphylaxis  . Clarithromycin Other (See Comments)    Hives, headaches, hard time swelling, felt like throat was closing up Hives, headaches, hard time swelling, felt like throat was closing up  . Diphenhydramine Other (See Comments)  . Estrogens Conjugated   . Estrogens, Conjugated Other (See Comments)  . Sulfonamide Derivatives Swelling    'tongue swelling'  . Nitrofurantoin Nausea Only    Current Outpatient Medications:  .  ACCU-CHEK AVIVA PLUS test strip, Use with meter to check glucose before meals and at bedtime, Disp: 200 each, Rfl: 12 .  Coenzyme Q10 (CO Q 10 PO), Take by mouth., Disp: , Rfl:  .  COMBIVENT RESPIMAT 20-100 MCG/ACT AERS respimat, 1 puff every 6 (six) hours as needed. , Disp: , Rfl:  .  diltiazem (CARDIZEM) 30 MG tablet, TAKE 1 TABLET (30 MG TOTAL) BY MOUTH 3 (  THREE) TIMES DAILY AS NEEDED., Disp: 270 tablet, Rfl: 0 .  ezetimibe (ZETIA) 10 MG tablet, TAKE 1 TABLET BY MOUTH EVERY DAY, Disp: 90 tablet, Rfl: 3 .  fluticasone (FLONASE) 50 MCG/ACT nasal spray, SPRAY TWO SPRAYS IN EACH NOSTRIL ONCE DAILY, Disp: 48 g, Rfl: 3 .  furosemide (LASIX) 20 MG tablet, Take 1 tablet (20 mg total) by mouth 2 (two) times daily., Disp: 180 tablet, Rfl: 3 .  HYDROcodone-acetaminophen (NORCO) 10-325 MG tablet, Take 1 tablet by mouth every 6 (six) hours as needed., Disp: 120 tablet, Rfl: 0 .  HYDROcodone-acetaminophen (NORCO) 10-325 MG tablet, Take 1 tablet by mouth every 6 (six) hours as  needed., Disp: 120 tablet, Rfl: 0 .  HYDROcodone-acetaminophen (NORCO) 10-325 MG tablet, Take 1 tablet by mouth every 6 (six) hours as needed., Disp: 120 tablet, Rfl: 0 .  insulin lispro (HUMALOG KWIKPEN) 100 UNIT/ML KwikPen, INJECT 10-20 UNITS THREE TIMES DAILY WITH EACH MEAL., Disp: 15 mL, Rfl: 12 .  Insulin Pen Needle (RELION PEN NEEDLE 31G/8MM) 31G X 8 MM MISC, Use with insulin pen three times daily, Disp: 100 each, Rfl: 12 .  levothyroxine (SYNTHROID) 125 MCG tablet, TAKE 1 TABLET (125 MCG TOTAL) BY MOUTH DAILY BEFORE BREAKFAST., Disp: 90 tablet, Rfl: 4 .  lisinopril (PRINIVIL,ZESTRIL) 2.5 MG tablet, TAKE 1 TABLET BY MOUTH EVERYDAY AT BEDTIME, Disp: 90 tablet, Rfl: 3 .  LORazepam (ATIVAN) 1 MG tablet, BID prn, Disp: 60 tablet, Rfl: 1 .  Magnesium 500 MG CAPS, Take 500 mg by mouth daily. , Disp: , Rfl:  .  nitrofurantoin, macrocrystal-monohydrate, (MACROBID) 100 MG capsule, Take 1 capsule (100 mg total) by mouth daily., Disp: 30 capsule, Rfl: 2 .  nitroGLYCERIN (NITROSTAT) 0.4 MG SL tablet, Place 1 tablet (0.4 mg total) under the tongue every 5 (five) minutes as needed for chest pain., Disp: 25 tablet, Rfl: 3 .  pantoprazole (PROTONIX) 20 MG tablet, Take 1 tablet (20 mg total) by mouth daily., Disp: 30 tablet, Rfl: 6 .  Vitamin D, Ergocalciferol, (DRISDOL) 50000 units CAPS capsule, Take 50,000 Units by mouth. , Disp: , Rfl:  .  warfarin (COUMADIN) 2 MG tablet, Take 1 tablet (2 mg total) by mouth daily., Disp: 30 tablet, Rfl: 3 .  warfarin (COUMADIN) 2.5 MG tablet, TAKE 1 TABLET BY MOUTH EVERY DAY, Disp: 30 tablet, Rfl: 5 .  warfarin (COUMADIN) 3 MG tablet, Take 1 tablet (3 mg total) by mouth daily., Disp: 30 tablet, Rfl: 12 .  warfarin (COUMADIN) 4 MG tablet, Take 1 tablet (4 mg total) by mouth daily., Disp: 30 tablet, Rfl: 12  Review of Systems  Constitutional: Negative.   Respiratory: Negative.   Cardiovascular: Negative.   Genitourinary: Positive for dysuria and frequency. Negative for  hematuria.  Musculoskeletal: Negative.    Social History   Tobacco Use  . Smoking status: Never Smoker  . Smokeless tobacco: Never Used  Substance Use Topics  . Alcohol use: No     Objective:   BP (!) 152/79 (BP Location: Right Arm, Patient Position: Sitting, Cuff Size: Large)   Pulse 93   Temp (!) 97.1 F (36.2 C) (Temporal)   Wt 230 lb 3.2 oz (104.4 kg)   BMI 38.31 kg/m  Vitals:   01/03/19 1328  BP: (!) 152/79  Pulse: 93  Temp: (!) 97.1 F (36.2 C)  TempSrc: Temporal  Weight: 230 lb 3.2 oz (104.4 kg)  Body mass index is 38.31 kg/m.  Physical Exam Constitutional:      General: She is  not in acute distress.    Appearance: She is well-developed.  HENT:     Head: Normocephalic and atraumatic.     Right Ear: Hearing normal.     Left Ear: Hearing normal.     Nose: Nose normal.  Eyes:     General: Lids are normal. No scleral icterus.       Right eye: No discharge.        Left eye: No discharge.     Conjunctiva/sclera: Conjunctivae normal.  Cardiovascular:     Rate and Rhythm: Normal rate and regular rhythm.     Heart sounds: Normal heart sounds.  Pulmonary:     Effort: Pulmonary effort is normal. No respiratory distress.  Musculoskeletal: Normal range of motion.  Skin:    Findings: Rash present. No lesion.     Comments: Red rash in inguinal folds and abdominal skin folds.  Neurological:     Mental Status: She is alert and oriented to person, place, and time.  Psychiatric:        Speech: Speech normal.        Behavior: Behavior normal.        Thought Content: Thought content normal.       Assessment & Plan    1. Dysuria Developed burning and frequency with urination over the past 10 days. No fever documented but feeling a little fatigued. States she has a history of recurrent UTI's and using Nitrofurantoin 100 mg qd. May stop the Nitrofurantoin and start Augmentin with AZO-Standard for discomfort. Must drink extra fluids and will get C&S. Recheck pending  reports. Urine microscopic exam shows TNTC WBC's and bacteria per hpf. - POCT Urinalysis Dipstick - CULTURE, URINE COMPREHENSIVE - amoxicillin-clavulanate (AUGMENTIN) 875-125 MG tablet; Take 1 tablet by mouth 2 (two) times daily.  Dispense: 20 tablet; Refill: 0  2. Type 2 diabetes mellitus with other circulatory complication, with long-term current use of insulin [facility]) State she had not been following her diet very well recently. Still on the Humalog 10-20 units TID with each meal per glucose reading. Must proceed with labs tests that were ordered 11-01-18.  3. Candidal dermatitis Red raw rash in abdominal skin folds and vulva. Has been on Nitrofurantoin daily as a prophylactic antibiotic for UTI's. Treat with Lotrisone BID and add Zeasorb Powder to keep these areas dry. - clotrimazole-betamethasone (LOTRISONE) cream; Apply 1 application topically 2 (two) times daily.  Dispense: 30 g; Refill: [place], PA  [facility]

## 2019-01-04 NOTE — Telephone Encounter (Signed)
Requested medication (s) are due for refill today: yes  Requested medication (s) are on the active medication list: yes  Last refill:  12/06/2018  Future visit scheduled: yes  Notes to clinic: refill cannot be delegated   Requested Prescriptions  Pending Prescriptions Disp Refills   LORazepam (ATIVAN) 1 MG tablet [Pharmacy Med Name: LORAZEPAM 1 MG TABLET] 60 tablet 1    Sig: TAKE 1 TABLET BY MOUTH TWICE A DAY AS NEEDED     Not Delegated - Psychiatry:  Anxiolytics/Hypnotics Failed - 01/03/2019  4:06 PM      Failed - This refill cannot be delegated      Failed - Urine Drug Screen completed in last 360 days.      Passed - Valid encounter within last 6 months    Recent Outpatient Visits          [facility], [place], [place]   1 month ago Type 2 diabetes mellitus with other circulatory complication, with long-term current use of insulin [facility])   [facility] Jerrol Banana., MD   3 months ago Chronic atrial fibrillation   [facility] Jerrol Banana., MD   4 months ago Cellulitis and abscess of leg   [facility] Jerrol Banana., MD   4 months ago Permanent atrial fibrillation   [facility] Jerrol Banana., MD      Future Appointments            In 2 months Jerrol Banana., MD [facility], [place]   In 3 months [name], Kathlene November, MD [facility], [facility]

## 2019-01-06 ENCOUNTER — Telehealth: Payer: Self-pay | Admitting: *Deleted

## 2019-01-06 NOTE — Telephone Encounter (Signed)
Spoke with patient and reviewed recommendations by provider to try antifungal creams in the skin folds to help clear that up. Reviewed over the counter options for this and she was very appreciative for the call. Advised if this did not help clear it up then to please reach out to her PCP for further recommendations. She verbalized understanding with no further questions at this time.

## 2019-01-06 NOTE — Telephone Encounter (Signed)
-----   Message from Valora Corporal, RN sent at 12/31/2018  5:41 PM EST ----- Regarding: cream Call and review antifungal creams for abdominal folds.

## 2019-01-07 ENCOUNTER — Ambulatory Visit (INDEPENDENT_AMBULATORY_CARE_PROVIDER_SITE_OTHER): Payer: Medicare Other

## 2019-01-07 ENCOUNTER — Other Ambulatory Visit: Payer: Self-pay

## 2019-01-07 DIAGNOSIS — I482 Chronic atrial fibrillation, unspecified: Secondary | ICD-10-CM

## 2019-01-07 LAB — SPECIMEN STATUS REPORT

## 2019-01-07 LAB — CULTURE, URINE COMPREHENSIVE

## 2019-01-07 LAB — POCT INR
INR: 2.2 (ref 2.0–3.0)
PT: 25.8

## 2019-01-07 NOTE — Patient Instructions (Signed)
Description   Dx: Atrial Fibrillation Current Dose: 3mg  daily, except 4 mg M-W-F Changes: None Return: 3 weeks

## 2019-01-17 ENCOUNTER — Other Ambulatory Visit: Payer: Self-pay | Admitting: Family Medicine

## 2019-01-18 LAB — LIPID PANEL
Chol/HDL Ratio: 3 ratio (ref 0.0–4.4)
Cholesterol, Total: 172 mg/dL (ref 100–199)
HDL: 57 mg/dL (ref >39)
LDL Chol Calc (NIH): 94 mg/dL (ref 0–99)
Triglycerides: 119 mg/dL (ref 0–149)
VLDL Cholesterol Cal: 21 mg/dL (ref 5–40)

## 2019-01-18 LAB — SEDIMENTATION RATE: Sed Rate: 45 mm/hr — ABNORMAL HIGH (ref 0–40)

## 2019-01-18 LAB — HEMOGLOBIN A1C
Est. average glucose Bld gHb Est-mCnc: 214 mg/dL
Hgb A1c MFr Bld: 9.1 % — ABNORMAL HIGH (ref 4.8–5.6)

## 2019-01-18 LAB — CK: Total CK: 87 U/L (ref 26–161)

## 2019-01-20 ENCOUNTER — Telehealth: Payer: Self-pay | Admitting: Family Medicine

## 2019-01-20 DIAGNOSIS — B372 Candidiasis of skin and nail: Secondary | ICD-10-CM

## 2019-01-20 DIAGNOSIS — N390 Urinary tract infection, site not specified: Secondary | ICD-10-CM

## 2019-01-20 MED ORDER — CLOTRIMAZOLE-BETAMETHASONE 1-0.05 % EX CREA: 1.0000 "application " | TOPICAL_CREAM | Freq: Two times a day (BID) | CUTANEOUS | 0 refills | Status: DC

## 2019-01-20 MED ORDER — CEPHALEXIN 500 MG PO CAPS: 500.0000 mg | ORAL_CAPSULE | Freq: Two times a day (BID) | ORAL | 0 refills | Status: DC

## 2019-01-20 NOTE — Telephone Encounter (Signed)
Please advise? Patient had ov with Simona Huh 01/03/2019.

## 2019-01-20 NOTE — Telephone Encounter (Signed)
Pt instructed by Simona Huh to call back if her sx not improved after finishing abx.  Pt states she is still having burning with urination, and thinks she may need another abx.  Pt does not want to go through this over the holidays.  [id number] - [place], [place] - [address] Phone:  [phone]  Fax:  [phone]

## 2019-01-20 NOTE — Addendum Note (Signed)
Addended by: Mar Daring on: 01/20/2019 04:19 PM   Modules accepted: Orders

## 2019-01-20 NOTE — Telephone Encounter (Signed)
Patient advised as directed below. Patient is asking if clotrimazole-betamethasone (LOTRISONE) cream can be also send in for her.

## 2019-01-20 NOTE — Telephone Encounter (Signed)
Keflex sent in.

## 2019-01-20 NOTE — Telephone Encounter (Signed)
Sent in

## 2019-01-28 ENCOUNTER — Ambulatory Visit: Payer: Self-pay

## 2019-01-30 ENCOUNTER — Other Ambulatory Visit: Payer: Self-pay | Admitting: Family Medicine

## 2019-02-03 ENCOUNTER — Encounter: Payer: Self-pay | Admitting: Physician Assistant

## 2019-02-03 ENCOUNTER — Other Ambulatory Visit: Payer: Self-pay

## 2019-02-03 ENCOUNTER — Ambulatory Visit (INDEPENDENT_AMBULATORY_CARE_PROVIDER_SITE_OTHER): Payer: Medicare Other | Admitting: Physician Assistant

## 2019-02-03 DIAGNOSIS — N390 Urinary tract infection, site not specified: Secondary | ICD-10-CM | POA: Diagnosis not present

## 2019-02-03 DIAGNOSIS — B9689 Other specified bacterial agents as the cause of diseases classified elsewhere: Secondary | ICD-10-CM

## 2019-02-03 DIAGNOSIS — N3010 Interstitial cystitis (chronic) without hematuria: Secondary | ICD-10-CM | POA: Diagnosis not present

## 2019-02-03 DIAGNOSIS — R300 Dysuria: Secondary | ICD-10-CM | POA: Diagnosis not present

## 2019-02-03 DIAGNOSIS — R3989 Other symptoms and signs involving the genitourinary system: Secondary | ICD-10-CM | POA: Diagnosis not present

## 2019-02-03 LAB — POCT URINALYSIS DIPSTICK
Appearance: ABNORMAL
Bilirubin, UA: NEGATIVE
Blood, UA: NEGATIVE
Glucose, UA: NEGATIVE
Ketones, UA: NEGATIVE
Nitrite, UA: POSITIVE
Protein, UA: NEGATIVE
Spec Grav, UA: 1.025 (ref 1.010–1.025)
Urobilinogen, UA: 0.2 E.U./dL
pH, UA: 6 (ref 5.0–8.0)

## 2019-02-03 MED ORDER — CEPHALEXIN 500 MG PO CAPS: 500.0000 mg | ORAL_CAPSULE | Freq: Two times a day (BID) | ORAL | 0 refills | Status: DC

## 2019-02-03 MED ORDER — FLAVOXATE HCL 100 MG PO TABS: 100.0000 mg | ORAL_TABLET | Freq: Three times a day (TID) | ORAL | 0 refills | Status: DC | PRN

## 2019-02-03 NOTE — Patient Instructions (Signed)
Urinary Tract Infection, Adult A urinary tract infection (UTI) is an infection of any part of the urinary tract. The urinary tract includes:  The kidneys.  The ureters.  The bladder.  The urethra. These organs make, store, and get rid of pee (urine) in the body. What are the causes? This is caused by germs (bacteria) in your genital area. These germs grow and cause swelling (inflammation) of your urinary tract. What increases the risk? You are more likely to develop this condition if:  You have a small, thin tube (catheter) to drain pee.  You cannot control when you pee or poop (incontinence).  You are female, and: ? You use these methods to prevent pregnancy:  A medicine that kills sperm (spermicide).  A device that blocks sperm (diaphragm). ? You have low levels of a female hormone (estrogen). ? You are pregnant.  You have genes that add to your risk.  You are sexually active.  You take antibiotic medicines.  You have trouble peeing because of: ? A prostate that is bigger than normal, if you are female. ? A blockage in the part of your body that drains pee from the bladder (urethra). ? A kidney stone. ? A nerve condition that affects your bladder (neurogenic bladder). ? Not getting enough to drink. ? Not peeing often enough.  You have other conditions, such as: ? Diabetes. ? A weak disease-fighting system (immune system). ? Sickle cell disease. ? Gout. ? Injury of the spine. What are the signs or symptoms? Symptoms of this condition include:  Needing to pee right away (urgently).  Peeing often.  Peeing small amounts often.  Pain or burning when peeing.  Blood in the pee.  Pee that smells bad or not like normal.  Trouble peeing.  Pee that is cloudy.  Fluid coming from the vagina, if you are female.  Pain in the belly or lower back. Other symptoms include:  Throwing up (vomiting).  No urge to eat.  Feeling mixed up (confused).  Being tired  and grouchy (irritable).  A fever.  Watery poop (diarrhea). How is this treated? This condition may be treated with:  Antibiotic medicine.  Other medicines.  Drinking enough water. Follow these instructions at home:  Medicines  Take over-the-counter and prescription medicines only as told by your doctor.  If you were prescribed an antibiotic medicine, take it as told by your doctor. Do not stop taking it even if you start to feel better. General instructions  Make sure you: ? Pee until your bladder is empty. ? Do not hold pee for a long time. ? Empty your bladder after sex. ? Wipe from front to back after pooping if you are a female. Use each tissue one time when you wipe.  Drink enough fluid to keep your pee pale yellow.  Keep all follow-up visits as told by your doctor. This is important. Contact a doctor if:  You do not get better after 1-2 days.  Your symptoms go away and then come back. Get help right away if:  You have very bad back pain.  You have very bad pain in your lower belly.  You have a fever.  You are sick to your stomach (nauseous).  You are throwing up. Summary  A urinary tract infection (UTI) is an infection of any part of the urinary tract.  This condition is caused by germs in your genital area.  There are many risk factors for a UTI. These include having a small, thin   tube to drain pee and not being able to control when you pee or poop.  Treatment includes antibiotic medicines for germs.  Drink enough fluid to keep your pee pale yellow. This information is not intended to replace advice given to you by your health care provider. Make sure you discuss any questions you have with your health care provider. Document Revised: 01/03/2018 Document Reviewed: 07/26/2017 Elsevier Patient Education  2020 Elsevier Inc.  

## 2019-02-03 NOTE — Progress Notes (Signed)
Patient: Tracy Mann Female    DOB: 05-16-1933   86 y.o.   MRN: [id number] Visit Date: 02/03/2019  Today's Provider: Mar Daring, PA-C   No chief complaint on file.  Subjective:     Urinary Tract Infection  This is a recurrent problem. The current episode started yesterday (symptoms started initially on 01/03/19. Was treated with augmentin. Called office on 01/20/19 and stated symptoms had not improved. Was placed on Keflex. Symptoms had improved, but returned yesterday). The problem occurs every urination. The problem has been gradually worsening. The quality of the pain is described as aching and burning. The pain is at a severity of 5/10. The pain is moderate. The maximum temperature recorded prior to her arrival was 100 - 100.9 F. The fever has been present for less than 1 day. She is not sexually active. There is a history of pyelonephritis. Associated symptoms include chills, flank pain, frequency, nausea and urgency. Pertinent negatives include no discharge, hematuria or hesitancy. She has tried antibiotics, increased fluids and acetaminophen for the symptoms. The treatment provided mild relief. Her past medical history is significant for recurrent UTIs and a urological procedure.    Has previously been followed by [facility] almost 20+ years ago. Was told she had IC and recurrent UTIs. Was then followed by Dr. Jacqlyn Larsen for years until he left just recently. Has been on Macrobid prophylaxis and tried to stop because Macrobid makes her nauseated right around Thanksgiving. That is when current infection flared and has not completely resolved since onset prior to the 01/03/19 OV.   Virtual Visit via Telephone Note  I connected with ILYA VALLECILLO on 02/03/19 at  3:00 PM EST by telephone and verified that I am speaking with the correct person using two identifiers.  Location: Patient: Home Provider: [facility]   I discussed the limitations, risks, security and privacy concerns of  performing an evaluation and management service by telephone and the availability of in person appointments. I also discussed with the patient that there may be a patient responsible charge related to this service. The patient expressed understanding and agreed to proceed.     Allergies  Allergen Reactions  . Other Anaphylaxis and Swelling    'tongue swelling'  . Sulfa Antibiotics Anaphylaxis  . Clarithromycin Other (See Comments)    Hives, headaches, hard time swelling, felt like throat was closing up Hives, headaches, hard time swelling, felt like throat was closing up  . Diphenhydramine Other (See Comments)  . Estrogens Conjugated   . Estrogens, Conjugated Other (See Comments)  . Sulfonamide Derivatives Swelling    'tongue swelling'  . Nitrofurantoin Nausea Only     Current Outpatient Medications:  .  ACCU-CHEK AVIVA PLUS test strip, Use with meter to check glucose before meals and at bedtime, Disp: 200 each, Rfl: 12 .  amoxicillin-clavulanate (AUGMENTIN) 875-125 MG tablet, Take 1 tablet by mouth 2 (two) times daily., Disp: 20 tablet, Rfl: 0 .  cephALEXin (KEFLEX) 500 MG capsule, Take 1 capsule (500 mg total) by mouth 2 (two) times daily., Disp: 14 capsule, Rfl: 0 .  clotrimazole-betamethasone (LOTRISONE) cream, Apply 1 application topically 2 (two) times daily., Disp: 30 g, Rfl: 0 .  Coenzyme Q10 (CO Q 10 PO), Take by mouth., Disp: , Rfl:  .  COMBIVENT RESPIMAT 20-100 MCG/ACT AERS respimat, 1 puff every 6 (six) hours as needed. , Disp: , Rfl:  .  diltiazem (CARDIZEM) 30 MG tablet, TAKE 1 TABLET (30 MG  TOTAL) BY MOUTH 3 (THREE) TIMES DAILY AS NEEDED., Disp: 270 tablet, Rfl: 0 .  ezetimibe (ZETIA) 10 MG tablet, TAKE 1 TABLET BY MOUTH EVERY DAY, Disp: 90 tablet, Rfl: 3 .  fluticasone (FLONASE) 50 MCG/ACT nasal spray, SPRAY TWO SPRAYS IN EACH NOSTRIL ONCE DAILY, Disp: 48 g, Rfl: 3 .  furosemide (LASIX) 20 MG tablet, Take 1 tablet (20 mg total) by mouth 2 (two) times daily., Disp: 180  tablet, Rfl: 3 .  HYDROcodone-acetaminophen (NORCO) 10-325 MG tablet, Take 1 tablet by mouth every 6 (six) hours as needed., Disp: 120 tablet, Rfl: 0 .  HYDROcodone-acetaminophen (NORCO) 10-325 MG tablet, Take 1 tablet by mouth every 6 (six) hours as needed., Disp: 120 tablet, Rfl: 0 .  HYDROcodone-acetaminophen (NORCO) 10-325 MG tablet, Take 1 tablet by mouth every 6 (six) hours as needed., Disp: 120 tablet, Rfl: 0 .  insulin lispro (HUMALOG KWIKPEN) 100 UNIT/ML KwikPen, INJECT 10-20 UNITS THREE TIMES DAILY WITH EACH MEAL., Disp: 15 mL, Rfl: 12 .  Insulin Pen Needle (RELION PEN NEEDLE 31G/8MM) 31G X 8 MM MISC, Use with insulin pen three times daily, Disp: 100 each, Rfl: 12 .  levothyroxine (SYNTHROID) 125 MCG tablet, TAKE 1 TABLET (125 MCG TOTAL) BY MOUTH DAILY BEFORE BREAKFAST., Disp: 90 tablet, Rfl: 4 .  lisinopril (ZESTRIL) 2.5 MG tablet, TAKE 1 TABLET BY MOUTH EVERYDAY AT BEDTIME, Disp: 90 tablet, Rfl: 1 .  LORazepam (ATIVAN) 1 MG tablet, TAKE 1 TABLET BY MOUTH TWICE A DAY AS NEEDED, Disp: 60 tablet, Rfl: 1 .  Magnesium 500 MG CAPS, Take 500 mg by mouth daily. , Disp: , Rfl:  .  nitrofurantoin, macrocrystal-monohydrate, (MACROBID) 100 MG capsule, Take 1 capsule (100 mg total) by mouth daily., Disp: 30 capsule, Rfl: 2 .  nitroGLYCERIN (NITROSTAT) 0.4 MG SL tablet, Place 1 tablet (0.4 mg total) under the tongue every 5 (five) minutes as needed for chest pain., Disp: 25 tablet, Rfl: 3 .  pantoprazole (PROTONIX) 20 MG tablet, Take 1 tablet (20 mg total) by mouth daily., Disp: 30 tablet, Rfl: 6 .  Vitamin D, Ergocalciferol, (DRISDOL) 50000 units CAPS capsule, Take 50,000 Units by mouth. , Disp: , Rfl:  .  warfarin (COUMADIN) 2 MG tablet, Take 1 tablet (2 mg total) by mouth daily., Disp: 30 tablet, Rfl: 3 .  warfarin (COUMADIN) 2.5 MG tablet, TAKE 1 TABLET BY MOUTH EVERY DAY, Disp: 30 tablet, Rfl: 5 .  warfarin (COUMADIN) 3 MG tablet, Take 1 tablet (3 mg total) by mouth daily., Disp: 30 tablet, Rfl:  12 .  warfarin (COUMADIN) 4 MG tablet, Take 1 tablet (4 mg total) by mouth daily., Disp: 30 tablet, Rfl: 12  Review of Systems  Constitutional: Positive for chills, fatigue and fever (low grade per patient; resolves with tylenol).  Respiratory: Negative.   Cardiovascular: Negative.   Gastrointestinal: Positive for nausea.  Genitourinary: Positive for dysuria (pain around urethra), flank pain, frequency and urgency. Negative for hematuria, hesitancy, vaginal discharge and vaginal pain.  Musculoskeletal: Positive for back pain.    Social History   Tobacco Use  . Smoking status: Never Smoker  . Smokeless tobacco: Never Used  Substance Use Topics  . Alcohol use: No      Objective:   There were no vitals taken for this visit. There were no vitals filed for this visit.There is no height or weight on file to calculate BMI.   Physical Exam Vitals reviewed.  Constitutional:      General: She is not in acute distress.  Appearance: She is not ill-appearing.  Pulmonary:     Effort: No respiratory distress.  Neurological:     Mental Status: She is alert.      No results found for any visits on 02/03/19.     Assessment & Plan    1. Dysuria UA today was positive for Nitrites and Leuks but patient has also been taking AZO and was discolored. Will send for culture. Treat with Keflex as symptoms had been improving while on it. Urispas given to patient as below. Had previously been prescribed by Dr. Jacqlyn Larsen and helped with the pain around the urethra. I will f/u pending culture results. Referral for a new Urologist was placed as below for patient's recurrent UTIs and other urological issues.  - flavoxATE (URISPAS) 100 MG tablet; Take 1 tablet (100 mg total) by mouth 3 (three) times daily as needed for bladder spasms.  Dispense: 30 tablet; Refill: 0 - CULTURE, URINE COMPREHENSIVE - Ambulatory referral to Urology  2. Suspected UTI See above medical treatment plan. - Ambulatory referral  to Urology  3. UTI due to Klebsiella species See above medical treatment plan. - cephALEXin (KEFLEX) 500 MG capsule; Take 1 capsule (500 mg total) by mouth 2 (two) times daily.  Dispense: 20 capsule; Refill: 0 - Ambulatory referral to Urology  4. Recurrent UTI See above medical treatment plan. - Ambulatory referral to Urology  5. Interstitial cystitis H/O this; told by [facility] many years ago. - Ambulatory referral to Urology    I discussed the assessment and treatment plan with the patient. The patient was provided an opportunity to ask questions and all were answered. The patient agreed with the plan and demonstrated an understanding of the instructions.   The patient was advised to call back or seek an in-person evaluation if the symptoms worsen or if the condition fails to improve as anticipated.  I provided 21 minutes of non-face-to-face time during this encounter.      Mar Daring, PA-C  [facility]

## 2019-02-04 ENCOUNTER — Other Ambulatory Visit: Payer: Self-pay

## 2019-02-04 ENCOUNTER — Ambulatory Visit (INDEPENDENT_AMBULATORY_CARE_PROVIDER_SITE_OTHER): Payer: Medicare Other

## 2019-02-04 DIAGNOSIS — I482 Chronic atrial fibrillation, unspecified: Secondary | ICD-10-CM | POA: Diagnosis not present

## 2019-02-04 LAB — POCT INR
INR: 2.2 (ref 2.0–3.0)
PT: 26.2

## 2019-02-05 LAB — CULTURE, URINE COMPREHENSIVE

## 2019-02-06 ENCOUNTER — Telehealth: Payer: Self-pay

## 2019-02-06 NOTE — Telephone Encounter (Signed)
-----   Message from Mar Daring, PA-C sent at 02/06/2019  7:43 AM EST ----- Urine culture grew out Klebsiella again. It is sensitive to Cephalexin. Continue until completed. Hopefully symptoms are improving as well.

## 2019-02-06 NOTE — Telephone Encounter (Signed)
LMTCB

## 2019-02-10 ENCOUNTER — Telehealth: Payer: Self-pay | Admitting: *Deleted

## 2019-02-10 NOTE — Telephone Encounter (Signed)
Pt returned call for lab message. Message given to her regarding her urine culture. She will continue with the cephalexin. She stated she is feeling better and she has an appointment with the urologist on Jan 25 th. And she will notify Joette Catching, PA if she has any other concerns.

## 2019-02-17 DIAGNOSIS — L720 Epidermal cyst: Secondary | ICD-10-CM | POA: Diagnosis not present

## 2019-02-17 DIAGNOSIS — I872 Venous insufficiency (chronic) (peripheral): Secondary | ICD-10-CM | POA: Diagnosis not present

## 2019-02-17 DIAGNOSIS — L570 Actinic keratosis: Secondary | ICD-10-CM | POA: Diagnosis not present

## 2019-02-17 DIAGNOSIS — L578 Other skin changes due to chronic exposure to nonionizing radiation: Secondary | ICD-10-CM | POA: Diagnosis not present

## 2019-02-17 DIAGNOSIS — D1801 Hemangioma of skin and subcutaneous tissue: Secondary | ICD-10-CM | POA: Diagnosis not present

## 2019-02-24 ENCOUNTER — Ambulatory Visit (INDEPENDENT_AMBULATORY_CARE_PROVIDER_SITE_OTHER): Payer: Medicare Other | Admitting: Urology

## 2019-02-24 ENCOUNTER — Encounter: Payer: Self-pay | Admitting: Urology

## 2019-02-24 ENCOUNTER — Other Ambulatory Visit: Payer: Self-pay

## 2019-02-24 VITALS — BP 138/82 | HR 75 | Ht 65.0 in | Wt 228.0 lb

## 2019-02-24 DIAGNOSIS — N390 Urinary tract infection, site not specified: Secondary | ICD-10-CM

## 2019-02-24 DIAGNOSIS — B372 Candidiasis of skin and nail: Secondary | ICD-10-CM

## 2019-02-24 DIAGNOSIS — R300 Dysuria: Secondary | ICD-10-CM

## 2019-02-24 DIAGNOSIS — N3020 Other chronic cystitis without hematuria: Secondary | ICD-10-CM

## 2019-02-24 LAB — URINALYSIS, COMPLETE
Bilirubin, UA: NEGATIVE
Glucose, UA: NEGATIVE
Ketones, UA: NEGATIVE
Nitrite, UA: NEGATIVE
Protein,UA: NEGATIVE
Specific Gravity, UA: 1.025 (ref 1.005–1.030)
Urobilinogen, Ur: 0.2 mg/dL (ref 0.2–1.0)
pH, UA: 5 (ref 5.0–7.5)

## 2019-02-24 LAB — MICROSCOPIC EXAMINATION: Epithelial Cells (non renal): >10 /hpf — AB (ref 0–10)

## 2019-02-24 MED ORDER — TRIMETHOPRIM 100 MG PO TABS: 100.0000 mg | ORAL_TABLET | Freq: Every day | ORAL | 11 refills | Status: DC

## 2019-02-24 MED ORDER — MIRABEGRON ER 50 MG PO TB24: 50.0000 mg | ORAL_TABLET | Freq: Every day | ORAL | 11 refills | Status: DC

## 2019-02-24 MED ORDER — CLOTRIMAZOLE-BETAMETHASONE 1-0.05 % EX CREA: 1.0000 "application " | TOPICAL_CREAM | Freq: Two times a day (BID) | CUTANEOUS | 0 refills | Status: DC

## 2019-02-24 NOTE — Progress Notes (Signed)
02/24/2019 10:40 AM   Tracy Mann [birth year] [id number]  Referring provider: Jerrol Banana., MD [address] [place],  [place] [address]  Chief Complaint  Patient presents with  . Dysuria    HPI: Patient has 20-year history of interstitial cystitis treated at [facility] by Dr. Jacqlyn Larsen.  Said she was stable on daily Macrodantin for years.  In the last year perhaps 3 times she has doubled up on the Macrodantin at home when she feels irritated.  She has just gone through 2 courses of Keflex that helped frequency and worsening burning.  She does not think she is infected now.  She has rare stress incontinence.  She normally voids every 1 or 2 hours gets up once at night  2 urine cultures positive for Klebsiella in the last several weeks.  No recent renal ultrasound    PMH: Past Medical History:  Diagnosis Date  . Acute myocardial infarction of inferior wall ([place])    a. 10/2005 - Presumed to be 2/2 to a coronary embolus in setting of persistent Afib-->Cath 2014 relatively nl cors, EF 45-50% w/ severe distal inferior/apical inferior HK w/ aneursymal appearance.  . Arthritis   . Chronic anticoagulation    a. warfarin.  . Chronic atrial fibrillation ([facility])    a. CHA2DS2VASc = 6--> warfarin.  . Chronic combined systolic and diastolic CHF (congestive heart failure) ([place])    a. 10/2012 Echo: EF 35-40%, diff HK, mild MR, mild biatrial enlargement;  b. 11/2012 EF 45-50% by LV gram; c. echo 07/2014: EF 40-45%, mod conc LVH, mod MR, severe biatrial enlargement, PASP 45 mm Hg c. Echo 01/2018 EF 45-50%, duffuse hypokinesis, mild MR, LA mod dilated, norm RVSF, dilated IVC  . Essential hypertension   . Hyperlipidemia, mixed   . Mixed Ischemic/Nonischemic Cardiomyopathy    a. 10/2012 Echo: EF 35-40%;  b. 11/2012 EF 45-50% by LV gram.  . Nonobstructive coronary artery disease    a. 2007 inferior wall MI thought to be secondary to thrombus from atrial fib b. 2014 cath with R dominant  coronary arterial system with mild luminal irregularities c. Myoview 05/2015 old inferior MI, no new ischemic changes  . Obesity   . Thyroid disease    hypothyroidism  . Type II diabetes mellitus ([place])    a. 07/2018 A1c 8.7 - long term insulin use  . Venous insufficiency   . Vitamin D deficiency     Surgical History: Past Surgical History:  Procedure Laterality Date  . CARDIAC CATHETERIZATION  11/2012   [facility];EF 45-50%  . CHOLECYSTECTOMY  1999  . TUBAL LIGATION  1968  . VESICOVAGINAL FISTULA CLOSURE W/ TAH  1996    Home Medications:  Allergies as of 02/24/2019      Reactions   Other Anaphylaxis, Swelling   'tongue swelling'   Sulfa Antibiotics Anaphylaxis   Clarithromycin Other (See Comments)   Hives, headaches, hard time swelling, felt like throat was closing up Hives, headaches, hard time swelling, felt like throat was closing up   Diphenhydramine Other (See Comments)   Estrogens Conjugated    Estrogens, Conjugated Other (See Comments)   Sulfonamide Derivatives Swelling   'tongue swelling'   Nitrofurantoin Nausea Only      Medication List       Accurate as of February 24, 2019 10:40 AM. If you have any questions, ask your nurse or doctor.        STOP taking these medications   amoxicillin-clavulanate 875-125 MG tablet Commonly known  as: AUGMENTIN Stopped by: Reece Packer, MD   cephALEXin 500 MG capsule Commonly known as: KEFLEX Stopped by: Reece Packer, MD     TAKE these medications   Accu-Chek Aviva Plus test strip Generic drug: glucose blood Use with meter to check glucose before meals and at bedtime   clotrimazole-betamethasone cream Commonly known as: Lotrisone Apply 1 application topically 2 (two) times daily.   CO Q 10 PO Take by mouth.   Combivent Respimat 20-100 MCG/ACT Aers respimat Generic drug: Ipratropium-Albuterol 1 puff every 6 (six) hours as needed.   diltiazem 30 MG tablet Commonly known as: CARDIZEM TAKE 1 TABLET (30 MG  TOTAL) BY MOUTH 3 (THREE) TIMES DAILY AS NEEDED.   ezetimibe 10 MG tablet Commonly known as: ZETIA TAKE 1 TABLET BY MOUTH EVERY DAY   flavoxATE 100 MG tablet Commonly known as: URISPAS Take 1 tablet (100 mg total) by mouth 3 (three) times daily as needed for bladder spasms.   fluticasone 50 MCG/ACT nasal spray Commonly known as: FLONASE SPRAY TWO SPRAYS IN EACH NOSTRIL ONCE DAILY   furosemide 20 MG tablet Commonly known as: LASIX Take 1 tablet (20 mg total) by mouth 2 (two) times daily.   HYDROcodone-acetaminophen 10-325 MG tablet Commonly known as: NORCO Take 1 tablet by mouth every 6 (six) hours as needed.   HYDROcodone-acetaminophen 10-325 MG tablet Commonly known as: NORCO Take 1 tablet by mouth every 6 (six) hours as needed.   HYDROcodone-acetaminophen 10-325 MG tablet Commonly known as: NORCO Take 1 tablet by mouth every 6 (six) hours as needed.   insulin lispro 100 UNIT/ML KwikPen Commonly known as: HumaLOG KwikPen INJECT 10-20 UNITS THREE TIMES DAILY WITH EACH MEAL.   Insulin Pen Needle 31G X 8 MM Misc Commonly known as: RELION PEN NEEDLE 31G/8MM Use with insulin pen three times daily   levothyroxine 125 MCG tablet Commonly known as: SYNTHROID TAKE 1 TABLET (125 MCG TOTAL) BY MOUTH DAILY BEFORE BREAKFAST.   lisinopril 2.5 MG tablet Commonly known as: ZESTRIL TAKE 1 TABLET BY MOUTH EVERYDAY AT BEDTIME   LORazepam 1 MG tablet Commonly known as: ATIVAN TAKE 1 TABLET BY MOUTH TWICE A DAY AS NEEDED   Magnesium 500 MG Caps Take 500 mg by mouth daily.   nitrofurantoin (macrocrystal-monohydrate) 100 MG capsule Commonly known as: MACROBID Take 1 capsule (100 mg total) by mouth daily.   nitroGLYCERIN 0.4 MG SL tablet Commonly known as: NITROSTAT Place 1 tablet (0.4 mg total) under the tongue every 5 (five) minutes as needed for chest pain.   pantoprazole 20 MG tablet Commonly known as: PROTONIX Take 1 tablet (20 mg total) by mouth daily.   Vitamin D  (Ergocalciferol) 1.25 MG (50000 UNIT) Caps capsule Commonly known as: DRISDOL Take 50,000 Units by mouth.   warfarin 2 MG tablet Commonly known as: COUMADIN Take as directed by the anticoagulation clinic. If you are unsure how to take this medication, talk to your nurse or doctor. Original instructions: Take 1 tablet (2 mg total) by mouth daily.   warfarin 3 MG tablet Commonly known as: COUMADIN Take as directed by the anticoagulation clinic. If you are unsure how to take this medication, talk to your nurse or doctor. Original instructions: Take 1 tablet (3 mg total) by mouth daily.   warfarin 2.5 MG tablet Commonly known as: COUMADIN Take as directed by the anticoagulation clinic. If you are unsure how to take this medication, talk to your nurse or doctor. Original instructions: TAKE 1 TABLET BY MOUTH  EVERY DAY   warfarin 4 MG tablet Commonly known as: Coumadin Take as directed by the anticoagulation clinic. If you are unsure how to take this medication, talk to your nurse or doctor. Original instructions: Take 1 tablet (4 mg total) by mouth daily.       Allergies:  Allergies  Allergen Reactions  . Other Anaphylaxis and Swelling    'tongue swelling'  . Sulfa Antibiotics Anaphylaxis  . Clarithromycin Other (See Comments)    Hives, headaches, hard time swelling, felt like throat was closing up Hives, headaches, hard time swelling, felt like throat was closing up  . Diphenhydramine Other (See Comments)  . Estrogens Conjugated   . Estrogens, Conjugated Other (See Comments)  . Sulfonamide Derivatives Swelling    'tongue swelling'  . Nitrofurantoin Nausea Only    Family History: Family History  Problem Relation Age of Onset  . Heart disease Mother   . Thyroid disease Father     Social History:  reports that she has never smoked. She has never used smokeless tobacco. She reports that she does not drink alcohol or use drugs.  ROS: UROLOGY Frequent Urination?: No Hard  to postpone urination?: No Burning/pain with urination?: No Get up at night to urinate?: No Leakage of urine?: No Urine stream starts and stops?: No Trouble starting stream?: No Do you have to strain to urinate?: No Blood in urine?: No Urinary tract infection?: No Sexually transmitted disease?: No Injury to kidneys or bladder?: No Painful intercourse?: No Weak stream?: No Currently pregnant?: No Vaginal bleeding?: No Last menstrual period?: N  Gastrointestinal Nausea?: No Vomiting?: No Indigestion/heartburn?: No Diarrhea?: No Constipation?: No  Constitutional Fever: No Night sweats?: No Weight loss?: No Fatigue?: No  Skin Skin rash/lesions?: No Itching?: No  Eyes Blurred vision?: No Double vision?: No  Ears/Nose/Throat Sore throat?: No Sinus problems?: No  Hematologic/Lymphatic Swollen glands?: No Easy bruising?: No  Cardiovascular Leg swelling?: No Chest pain?: No  Respiratory Cough?: No Shortness of breath?: No  Endocrine Excessive thirst?: No  Musculoskeletal Back pain?: No Joint pain?: No  Neurological Headaches?: No Dizziness?: No  Psychologic Depression?: No Anxiety?: No  Physical Exam: BP 138/82   Pulse 75   Ht 5\' 5"  (1.651 m)   Wt 103.4 kg   BMI 37.94 kg/m   Constitutional:  Alert and oriented, No acute distress. HEENT: [place] AT, moist mucus membranes.  Trachea midline, no masses. Cardiovascular: No clubbing, cyanosis, or edema. Respiratory: Normal respiratory effort, no increased work of breathing. GI: Abdomen is soft, nontender, nondistended, no abdominal masses GU: No CVA tenderness.  Alert Skin: No rashes, bruises or suspicious lesions. Lymph: No cervical or inguinal adenopathy. Neurologic: Grossly intact, no focal deficits, moving all 4 extremities. Psychiatric: Normal mood and affect.  Laboratory Data: Lab Results  Component Value Date   WBC 9.5 08/29/2018   HGB 13.5 08/29/2018   HCT 41.5 08/29/2018   MCV 91  08/29/2018   PLT 225 08/29/2018    Lab Results  Component Value Date   CREATININE 0.78 08/29/2018    No results found for: PSA  No results found for: TESTOSTERONE  Lab Results  Component Value Date   HGBA1C 9.1 (H) 01/17/2019    Urinalysis    Component Value Date/Time   BILIRUBINUR negative 02/03/2019 1553   PROTEINUR Negative 02/03/2019 1553   UROBILINOGEN 0.2 02/03/2019 1553   NITRITE positive 02/03/2019 1553   LEUKOCYTESUR Small (1+) (A) 02/03/2019 1553    Pertinent Imaging: Reviewed urinalysis.  Reviewed chart.  Send urine for culture  Assessment & Plan: Patient is having breakthrough bladder infections.  I would like to switch prophylaxis to trimethoprim.  Send urine for culture.  Call if positive.  Get renal ultrasound in 2 weeks and call if abnormal.  Otherwise reassess in 8 weeks  When she takes her diuretic she has a lot of frequency.  Also it limits her ability to drink certain fluids affecting her quality life.  She wants to try the Myrbetriq 50 mg samples and prescription as well we will see her in 8 weeks to see if this helps this  1. Dysuria  - Urinalysis, Complete   No follow-ups on file.  Reece Packer, MD  [facility] [address] [place], [place] [address] [phone]

## 2019-02-26 LAB — CULTURE, URINE COMPREHENSIVE

## 2019-03-04 ENCOUNTER — Other Ambulatory Visit: Payer: Self-pay

## 2019-03-04 ENCOUNTER — Ambulatory Visit (INDEPENDENT_AMBULATORY_CARE_PROVIDER_SITE_OTHER): Payer: Medicare Other

## 2019-03-04 DIAGNOSIS — I482 Chronic atrial fibrillation, unspecified: Secondary | ICD-10-CM | POA: Diagnosis not present

## 2019-03-04 LAB — POCT INR
INR: 2.3 (ref 2.0–3.0)
PT: 28.2

## 2019-03-04 NOTE — Patient Instructions (Signed)
Description   3mg  daily, except 4 mg M-W-F F/U 4 weeks

## 2019-03-05 ENCOUNTER — Other Ambulatory Visit: Payer: Self-pay | Admitting: Family Medicine

## 2019-03-05 NOTE — Telephone Encounter (Signed)
Requested Prescriptions  Pending Prescriptions Disp Refills  . ACCU-CHEK AVIVA PLUS test strip [Pharmacy Med Name: ACCU-CHEK AVIVA PLUS TEST STRP] 200 strip 12    Sig: USE WITH METER TO CHECK GLUCOSE BEFORE MEALS AND AT BEDTIME     Endocrinology: Diabetes - Testing Supplies Passed - 03/05/2019  1:11 AM      Passed - Valid encounter within last 12 months    Recent Outpatient Visits          1 month ago [place], [place], [place]   2 months ago [place], [place] E, [place]   3 months ago Type 2 diabetes mellitus with other circulatory complication, with long-term current use of insulin [facility])   [facility] Jerrol Banana., MD   5 months ago Chronic atrial fibrillation   [facility] Jerrol Banana., MD   6 months ago Cellulitis and abscess of leg   [facility] Jerrol Banana., MD      Future Appointments            In 2 weeks Jerrol Banana., MD [facility]   In 1 month [name], Kathlene November, MD [facility], [facility]   In 1 month C[place], Nicki Reaper, [place]

## 2019-03-20 ENCOUNTER — Ambulatory Visit: Payer: Self-pay | Admitting: Family Medicine

## 2019-03-21 NOTE — Progress Notes (Signed)
hy      Patient: Tracy Mann Female    DOB: 14-Jan-1934   86 y.o.   MRN: [id number] Visit Date: 03/24/2019  Today's Provider: Wilhemena Durie, MD   Chief Complaint  Patient presents with  . Osteoarthritis   Subjective:     HPI  [facility] and started on Keflex for UTI.  He states she feels better today. Patient needs refill on flavoxate and pen needles for DM. Patient presents today to have exam and refill for osteoarthritis medication.  Patient states she is intentionally trying to cut back on her pain medications.  We will try to help her cut back on her Norco. Type 2 diabetes mellitus with other circulatory complication, with long-term current use of insulin ([place]) From 12/02/2018--Morbid obesity is also a major problem for this patient.  Weight loss again encouraged. Labs done on 01/17/2019 showing-Labs stable but diabetes not well controlled. Advised toincrease insulin by 2 units daily. Hemoglobin A1c 9.1.   Chronic atrial fibrillation ([place]) From 12/02/2018-INR slightly high today at 3.3.  Decreased Coumadin to 3 mg daily  Primary osteoarthritis involving multiple joints From 12/02/2018-On her next visit we will begin to cut down on the number of (hydrocodone-ace) pills per prescription by 10 per prescription.  This is something that we need to do for this 86 year old  Mixed hyperlipidemia From 12/02/2018-Labs done on 01/17/2019 showing-stable..   Osteoarthritis, unspecified osteoarthritis type, unspecified site From 12/02/2018-Labs done on 01/17/2019 showing-stable.   Cough From 12/02/2018-Obtain chest x-ray.  No indication for any antibiotics at this time.   Allergies  Allergen Reactions  . Other Anaphylaxis and Swelling    'tongue swelling'  . Sulfa Antibiotics Anaphylaxis  . Clarithromycin Other (See Comments)    Hives, headaches, hard time swelling, felt like throat was closing up Hives, headaches, hard time swelling,  felt like throat was closing up  . Diphenhydramine Other (See Comments)  . Estrogens Conjugated   . Estrogens, Conjugated Other (See Comments)  . Sulfonamide Derivatives Swelling    'tongue swelling'  . Nitrofurantoin Nausea Only     Current Outpatient Medications:  .  ACCU-CHEK AVIVA PLUS test strip, USE WITH METER TO CHECK GLUCOSE BEFORE MEALS AND AT BEDTIME, Disp: 200 strip, Rfl: 12 .  clotrimazole-betamethasone (LOTRISONE) cream, Apply 1 application topically 2 (two) times daily., Disp: 30 g, Rfl: 0 .  diltiazem (CARDIZEM) 30 MG tablet, TAKE 1 TABLET (30 MG TOTAL) BY MOUTH 3 (THREE) TIMES DAILY AS NEEDED., Disp: 270 tablet, Rfl: 0 .  ezetimibe (ZETIA) 10 MG tablet, TAKE 1 TABLET BY MOUTH EVERY DAY, Disp: 90 tablet, Rfl: 3 .  flavoxATE (URISPAS) 100 MG tablet, Take 1 tablet (100 mg total) by mouth 3 (three) times daily as needed for bladder spasms., Disp: 30 tablet, Rfl: 0 .  fluticasone (FLONASE) 50 MCG/ACT nasal spray, SPRAY TWO SPRAYS IN EACH NOSTRIL ONCE DAILY, Disp: 48 g, Rfl: 3 .  furosemide (LASIX) 20 MG tablet, Take 1 tablet (20 mg total) by mouth 2 (two) times daily., Disp: 180 tablet, Rfl: 3 .  HYDROcodone-acetaminophen (NORCO) 10-325 MG tablet, Take 1 tablet by mouth every 6 (six) hours as needed., Disp: 120 tablet, Rfl: 0 .  HYDROcodone-acetaminophen (NORCO) 10-325 MG tablet, Take 1 tablet by mouth every 6 (six) hours as needed., Disp: 120 tablet, Rfl: 0 .  HYDROcodone-acetaminophen (NORCO) 10-325 MG tablet, Take 1 tablet by mouth every 6 (six) hours as needed., Disp: 120 tablet, Rfl: 0 .  insulin lispro (HUMALOG KWIKPEN) 100 UNIT/ML KwikPen, INJECT 10-20 UNITS THREE TIMES DAILY WITH EACH MEAL., Disp: 15 mL, Rfl: 12 .  Insulin Pen Needle (RELION PEN NEEDLE 31G/8MM) 31G X 8 MM MISC, Use with insulin pen three times daily, Disp: 100 each, Rfl: 12 .  levothyroxine (SYNTHROID) 125 MCG tablet, TAKE 1 TABLET (125 MCG TOTAL) BY MOUTH DAILY BEFORE BREAKFAST., Disp: 90 tablet, Rfl: 4 .   lisinopril (ZESTRIL) 2.5 MG tablet, TAKE 1 TABLET BY MOUTH EVERYDAY AT BEDTIME, Disp: 90 tablet, Rfl: 1 .  LORazepam (ATIVAN) 1 MG tablet, TAKE 1 TABLET BY MOUTH TWICE A DAY AS NEEDED, Disp: 60 tablet, Rfl: 1 .  Magnesium 500 MG CAPS, Take 500 mg by mouth daily. , Disp: , Rfl:  .  nitroGLYCERIN (NITROSTAT) 0.4 MG SL tablet, Place 1 tablet (0.4 mg total) under the tongue every 5 (five) minutes as needed for chest pain., Disp: 25 tablet, Rfl: 3 .  pantoprazole (PROTONIX) 20 MG tablet, Take 1 tablet (20 mg total) by mouth daily., Disp: 30 tablet, Rfl: 6 .  Vitamin D, Ergocalciferol, (DRISDOL) 50000 units CAPS capsule, Take 50,000 Units by mouth. , Disp: , Rfl:  .  warfarin (COUMADIN) 3 MG tablet, Take 1 tablet (3 mg total) by mouth daily., Disp: 30 tablet, Rfl: 12 .  warfarin (COUMADIN) 4 MG tablet, Take 1 tablet (4 mg total) by mouth daily., Disp: 30 tablet, Rfl: 12 .  Coenzyme Q10 (CO Q 10 PO), Take by mouth., Disp: , Rfl:  .  COMBIVENT RESPIMAT 20-100 MCG/ACT AERS respimat, 1 puff every 6 (six) hours as needed. , Disp: , Rfl:  .  mirabegron ER (MYRBETRIQ) 50 MG TB24 tablet, Take 1 tablet (50 mg total) by mouth daily. (Patient not taking: Reported on 03/24/2019), Disp: 30 tablet, Rfl: 11 .  nitrofurantoin, macrocrystal-monohydrate, (MACROBID) 100 MG capsule, Take 1 capsule (100 mg total) by mouth daily. (Patient not taking: Reported on 02/24/2019), Disp: 30 capsule, Rfl: 2 .  trimethoprim (TRIMPEX) 100 MG tablet, Take 1 tablet (100 mg total) by mouth daily. (Patient not taking: Reported on 03/24/2019), Disp: 30 tablet, Rfl: 11 .  warfarin (COUMADIN) 2 MG tablet, Take 1 tablet (2 mg total) by mouth daily. (Patient not taking: Reported on 03/24/2019), Disp: 30 tablet, Rfl: 3 .  warfarin (COUMADIN) 2.5 MG tablet, TAKE 1 TABLET BY MOUTH EVERY DAY (Patient not taking: Reported on 03/24/2019), Disp: 30 tablet, Rfl: 5  Review of Systems  Constitutional: Negative for appetite change, chills, fatigue and fever.    Respiratory: Negative for chest tightness and shortness of breath.   Cardiovascular: Negative for chest pain and palpitations.  Gastrointestinal: Negative for abdominal pain, nausea and vomiting.  Genitourinary: Positive for difficulty urinating and dysuria.       Much improved today.  Musculoskeletal: Positive for arthralgias.  Skin: Negative.   Allergic/Immunologic: Negative.   Neurological: Negative for dizziness and weakness.  Hematological: Negative.   Psychiatric/Behavioral: Negative.     Social History   Tobacco Use  . Smoking status: Never Smoker  . Smokeless tobacco: Never Used  Substance Use Topics  . Alcohol use: No      Objective:   BP 111/76 (BP Location: Right Arm, Patient Position: Sitting, Cuff Size: Large)   Pulse 86   Temp (!) 96.8 F (36 C) (Temporal)   Wt 230 lb 12.8 oz (104.7 kg)   SpO2 92%   BMI 38.41 kg/m  Vitals:   03/24/19 1341  BP: 111/76  Pulse: 86  Temp: Marland Kitchen)  96.8 F (36 C)  TempSrc: Temporal  SpO2: 92%  Weight: 230 lb 12.8 oz (104.7 kg)  Body mass index is 38.41 kg/m.   Physical Exam Vitals reviewed.  Constitutional:      General: She is not in acute distress.    Appearance: She is well-developed. She is obese.  HENT:     Head: Normocephalic and atraumatic.     Right Ear: Hearing and external ear normal.     Left Ear: Hearing and external ear normal.     Nose: Nose normal.  Eyes:     General: Lids are normal. No scleral icterus.       Right eye: No discharge.        Left eye: No discharge.     Conjunctiva/sclera: Conjunctivae normal.  Neck:     Thyroid: No thyromegaly.  Cardiovascular:     Rate and Rhythm: Normal rate and regular rhythm.     Heart sounds: Normal heart sounds.  Pulmonary:     Effort: Pulmonary effort is normal. No respiratory distress.  Abdominal:     Palpations: Abdomen is soft.     Tenderness: There is no right CVA tenderness or left CVA tenderness.  Musculoskeletal:        General: Normal range of  motion.     Comments: 1+ LE  edema.  Skin:    General: Skin is warm and dry.     Findings: No lesion.  Neurological:     General: No focal deficit present.     Mental Status: She is alert and oriented to person, place, and time. Mental status is at baseline.  Psychiatric:        Mood and Affect: Mood normal.        Speech: Speech normal.        Behavior: Behavior normal.        Thought Content: Thought content normal.        Judgment: Judgment normal.      No results found for any visits on 03/24/19.     Assessment & Plan    1. Dysuria Recurrent issue.  She has follow-up scheduled with urology. - flavoxATE (URISPAS) 100 MG tablet; Take 1 tablet (100 mg total) by mouth 3 (three) times daily as needed for bladder spasms.  Dispense: 30 tablet; Refill: 0  2. Primary osteoarthritis involving multiple joints Refill narcotics with understanding that she is cutting back on their use. - HYDROcodone-acetaminophen (NORCO) 10-325 MG tablet; Take 1 tablet by mouth every 6 (six) hours as needed.  Dispense: 120 tablet; Refill: 0  3. Permanent atrial fibrillation (HCC) Chronic Coumadin  4. Chronic diastolic heart failure (HCC) Clinically stable  5. Type 2 diabetes mellitus without complication, without long-term current use of insulin (HCC) On insulin. - Insulin Pen Needle (RELION PEN NEEDLE 31G/8MM) 31G X 8 MM MISC; Use with insulin pen three times daily  Dispense: 100 each; Refill: 12  6. Class 2 severe obesity due to excess calories with serious comorbidity and body mass index (BMI) of 39.0 to 39.9 in adult [facility]) Major issue for this patient.  Encouraged her to work on dietary changes so she can lose weight.  7. Recurrent major depressive disorder, in partial remission ([place]) In partial remission.  No changes today.  8. Arthritis  - HYDROcodone-acetaminophen (NORCO) 10-325 MG tablet; Take 1 tablet by mouth every 6 (six) hours as needed.  Dispense: 120 tablet; Refill: 0 -  HYDROcodone-acetaminophen (NORCO) 10-325 MG tablet; Take 1 tablet by mouth  every 6 (six) hours as needed.  Dispense: 120 tablet; Refill: 0     Richard Cranford Mon, MD  [facility]

## 2019-03-23 DIAGNOSIS — N390 Urinary tract infection, site not specified: Secondary | ICD-10-CM | POA: Diagnosis not present

## 2019-03-24 ENCOUNTER — Ambulatory Visit (INDEPENDENT_AMBULATORY_CARE_PROVIDER_SITE_OTHER): Payer: Medicare Other | Admitting: Family Medicine

## 2019-03-24 ENCOUNTER — Other Ambulatory Visit: Payer: Self-pay

## 2019-03-24 ENCOUNTER — Encounter: Payer: Self-pay | Admitting: Family Medicine

## 2019-03-24 VITALS — BP 111/76 | HR 86 | Temp 96.8°F | Wt 230.8 lb

## 2019-03-24 DIAGNOSIS — E119 Type 2 diabetes mellitus without complications: Secondary | ICD-10-CM

## 2019-03-24 DIAGNOSIS — M8949 Other hypertrophic osteoarthropathy, multiple sites: Secondary | ICD-10-CM

## 2019-03-24 DIAGNOSIS — I4821 Permanent atrial fibrillation: Secondary | ICD-10-CM

## 2019-03-24 DIAGNOSIS — I5032 Chronic diastolic (congestive) heart failure: Secondary | ICD-10-CM

## 2019-03-24 DIAGNOSIS — Z6839 Body mass index (BMI) 39.0-39.9, adult: Secondary | ICD-10-CM

## 2019-03-24 DIAGNOSIS — M159 Polyosteoarthritis, unspecified: Secondary | ICD-10-CM

## 2019-03-24 DIAGNOSIS — M1990 Unspecified osteoarthritis, unspecified site: Secondary | ICD-10-CM

## 2019-03-24 DIAGNOSIS — R300 Dysuria: Secondary | ICD-10-CM | POA: Diagnosis not present

## 2019-03-24 DIAGNOSIS — F3341 Major depressive disorder, recurrent, in partial remission: Secondary | ICD-10-CM

## 2019-03-24 MED ORDER — HYDROCODONE-ACETAMINOPHEN 10-325 MG PO TABS: 1.0000 | ORAL_TABLET | Freq: Four times a day (QID) | ORAL | 0 refills | Status: DC | PRN

## 2019-03-24 MED ORDER — RELION PEN NEEDLES 31G X 8 MM MISC: 12 refills | Status: AC

## 2019-03-24 MED ORDER — FLAVOXATE HCL 100 MG PO TABS: 100.0000 mg | ORAL_TABLET | Freq: Three times a day (TID) | ORAL | 0 refills | Status: DC | PRN

## 2019-03-26 ENCOUNTER — Telehealth: Payer: Self-pay | Admitting: Urology

## 2019-03-26 NOTE — Telephone Encounter (Signed)
Patient called the office today with questions about her medications.  1.  Dr. Matilde Sprang prescribed her daily trimethoprim - she started taking, but experienced head and neck pain so she stopped taking  2.  Dr. Matilde Sprang also gave her samples of Myrbetriq - she started taking, but starting swelling in her feet, ankles and legs, so she stopped taking  3.  She went to the Urgent Care and they prescribed Cipro for 7 days for UTI.    4. She is also using an over the counter cream for yeast and a medicated powder.  She has also developed small "water blisters" in her vaginal area.    5.  She is taking a prescription for Flavoxate from Dr. Rosanna Randy that seems to work for bladder spasms.   6.  She is also taking peridium for burning.  She is very confused about what to take or not to take and needs some direction.  Please call her to advise.

## 2019-03-27 ENCOUNTER — Other Ambulatory Visit: Payer: Self-pay

## 2019-03-27 ENCOUNTER — Ambulatory Visit (INDEPENDENT_AMBULATORY_CARE_PROVIDER_SITE_OTHER): Payer: Medicare Other | Admitting: Physician Assistant

## 2019-03-27 ENCOUNTER — Encounter: Payer: Self-pay | Admitting: Physician Assistant

## 2019-03-27 VITALS — BP 128/72 | HR 76 | Temp 95.5°F | Wt 231.6 lb

## 2019-03-27 DIAGNOSIS — B372 Candidiasis of skin and nail: Secondary | ICD-10-CM

## 2019-03-27 MED ORDER — TERCONAZOLE 0.4 % VA CREA: TOPICAL_CREAM | VAGINAL | 0 refills | Status: DC

## 2019-03-27 NOTE — Progress Notes (Signed)
Patient: Tracy Mann Female    DOB: 22-Oct-1933   86 y.o.   MRN: [id number] Visit Date: 03/27/2019  Today's Provider: Trinna Post, PA-C   Chief Complaint  Patient presents with  . Rash   Subjective:    I, Porsha McClurkin,CMA am acting as a Education administrator for [employer].  Rash This is a new problem. The current episode started in the past 7 days. The problem has been gradually worsening since onset. The affected locations include the groin and genitalia. The rash is characterized by burning, itchiness, dryness, blistering, redness and pain. Associated with: Antibiotics. Pertinent negatives include no facial edema, fatigue, fever, shortness of breath, sore throat or vomiting. Past treatments include antibiotics. The treatment provided no relief.  Patient states that the rash is not near her buttocks. Patient has been on multiple antibiotics recently for UTIs. She was previously treated for a similar rash last fall by Vernie Murders, PA-C with lotrisone. She reports this did not help. She is currently using powder to keep the area dry.    Allergies  Allergen Reactions  . Other Anaphylaxis and Swelling    'tongue swelling'  . Sulfa Antibiotics Anaphylaxis  . Clarithromycin Other (See Comments)    Hives, headaches, hard time swelling, felt like throat was closing up Hives, headaches, hard time swelling, felt like throat was closing up  . Diphenhydramine Other (See Comments)  . Estrogens Conjugated   . Estrogens, Conjugated Other (See Comments)  . Sulfonamide Derivatives Swelling    'tongue swelling'  . Nitrofurantoin Nausea Only     Current Outpatient Medications:  .  ACCU-CHEK AVIVA PLUS test strip, USE WITH METER TO CHECK GLUCOSE BEFORE MEALS AND AT BEDTIME, Disp: 200 strip, Rfl: 12 .  clotrimazole-betamethasone (LOTRISONE) cream, Apply 1 application topically 2 (two) times daily., Disp: 30 g, Rfl: 0 .  Coenzyme Q10 (CO Q 10 PO), Take by mouth., Disp: , Rfl:  .   COMBIVENT RESPIMAT 20-100 MCG/ACT AERS respimat, 1 puff every 6 (six) hours as needed. , Disp: , Rfl:  .  diltiazem (CARDIZEM) 30 MG tablet, TAKE 1 TABLET (30 MG TOTAL) BY MOUTH 3 (THREE) TIMES DAILY AS NEEDED., Disp: 270 tablet, Rfl: 0 .  ezetimibe (ZETIA) 10 MG tablet, TAKE 1 TABLET BY MOUTH EVERY DAY, Disp: 90 tablet, Rfl: 3 .  flavoxATE (URISPAS) 100 MG tablet, Take 1 tablet (100 mg total) by mouth 3 (three) times daily as needed for bladder spasms., Disp: 30 tablet, Rfl: 0 .  fluticasone (FLONASE) 50 MCG/ACT nasal spray, SPRAY TWO SPRAYS IN EACH NOSTRIL ONCE DAILY, Disp: 48 g, Rfl: 3 .  furosemide (LASIX) 20 MG tablet, Take 1 tablet (20 mg total) by mouth 2 (two) times daily., Disp: 180 tablet, Rfl: 3 .  [START ON 05/22/2019] HYDROcodone-acetaminophen (NORCO) 10-325 MG tablet, Take 1 tablet by mouth every 6 (six) hours as needed., Disp: 120 tablet, Rfl: 0 .  [START ON 04/21/2019] HYDROcodone-acetaminophen (NORCO) 10-325 MG tablet, Take 1 tablet by mouth every 6 (six) hours as needed., Disp: 120 tablet, Rfl: 0 .  HYDROcodone-acetaminophen (NORCO) 10-325 MG tablet, Take 1 tablet by mouth every 6 (six) hours as needed., Disp: 120 tablet, Rfl: 0 .  insulin lispro (HUMALOG KWIKPEN) 100 UNIT/ML KwikPen, INJECT 10-20 UNITS THREE TIMES DAILY WITH EACH MEAL., Disp: 15 mL, Rfl: 12 .  Insulin Pen Needle (RELION PEN NEEDLE 31G/8MM) 31G X 8 MM MISC, Use with insulin pen three times daily, Disp: 100 each, Rfl: 12 .  levothyroxine (SYNTHROID) 125 MCG tablet, TAKE 1 TABLET (125 MCG TOTAL) BY MOUTH DAILY BEFORE BREAKFAST., Disp: 90 tablet, Rfl: 4 .  lisinopril (ZESTRIL) 2.5 MG tablet, TAKE 1 TABLET BY MOUTH EVERYDAY AT BEDTIME, Disp: 90 tablet, Rfl: 1 .  LORazepam (ATIVAN) 1 MG tablet, TAKE 1 TABLET BY MOUTH TWICE A DAY AS NEEDED, Disp: 60 tablet, Rfl: 1 .  Magnesium 500 MG CAPS, Take 500 mg by mouth daily. , Disp: , Rfl:  .  mirabegron ER (MYRBETRIQ) 50 MG TB24 tablet, Take 1 tablet (50 mg total) by mouth daily.,  Disp: 30 tablet, Rfl: 11 .  nitrofurantoin, macrocrystal-monohydrate, (MACROBID) 100 MG capsule, Take 1 capsule (100 mg total) by mouth daily., Disp: 30 capsule, Rfl: 2 .  nitroGLYCERIN (NITROSTAT) 0.4 MG SL tablet, Place 1 tablet (0.4 mg total) under the tongue every 5 (five) minutes as needed for chest pain., Disp: 25 tablet, Rfl: 3 .  pantoprazole (PROTONIX) 20 MG tablet, Take 1 tablet (20 mg total) by mouth daily., Disp: 30 tablet, Rfl: 6 .  trimethoprim (TRIMPEX) 100 MG tablet, Take 1 tablet (100 mg total) by mouth daily., Disp: 30 tablet, Rfl: 11 .  Vitamin D, Ergocalciferol, (DRISDOL) 50000 units CAPS capsule, Take 50,000 Units by mouth. , Disp: , Rfl:  .  warfarin (COUMADIN) 2 MG tablet, Take 1 tablet (2 mg total) by mouth daily., Disp: 30 tablet, Rfl: 3 .  warfarin (COUMADIN) 2.5 MG tablet, TAKE 1 TABLET BY MOUTH EVERY DAY, Disp: 30 tablet, Rfl: 5 .  warfarin (COUMADIN) 3 MG tablet, Take 1 tablet (3 mg total) by mouth daily., Disp: 30 tablet, Rfl: 12 .  warfarin (COUMADIN) 4 MG tablet, Take 1 tablet (4 mg total) by mouth daily., Disp: 30 tablet, Rfl: 12  Review of Systems  Constitutional: Negative for fatigue and fever.  HENT: Negative for sore throat.   Respiratory: Negative for shortness of breath.   Gastrointestinal: Negative for vomiting.  Skin: Positive for rash.    Social History   Tobacco Use  . Smoking status: Never Smoker  . Smokeless tobacco: Never Used  Substance Use Topics  . Alcohol use: No      Objective:   BP 128/72 (BP Location: Left Arm, Patient Position: Sitting, Cuff Size: Normal)   Pulse 76   Temp (!) 95.5 F (35.3 C) (Temporal)   Wt 231 lb 9.6 oz (105.1 kg)   SpO2 96%   BMI 38.54 kg/m  Vitals:   03/27/19 1508  BP: 128/72  Pulse: 76  Temp: (!) 95.5 F (35.3 C)  TempSrc: Temporal  SpO2: 96%  Weight: 231 lb 9.6 oz (105.1 kg)  Body mass index is 38.54 kg/m.   Physical Exam Constitutional:      General: She is not in acute distress.     Appearance: Normal appearance. She is not ill-appearing.  Genitourinary:    Pubic Area: Rash present.     Labia:        Right: Rash present.        Left: Rash present.      Comments: There is an erythematous vulvar rash with scattered papules.  Neurological:     Mental Status: She is alert. Mental status is at baseline.  Psychiatric:        Mood and Affect: Mood normal.        Behavior: Behavior normal.      No results found for any visits on 03/27/19.     Assessment & Plan    1. Candidal  skin infection  Patient requests antifungal pill however this interacts with her warfarin. Will give cream as below.  - terconazole (TERAZOL 7) 0.4 % vaginal cream; One thin layer twice daily.  Dispense: 45 g; Refill: 0  The entirety of the information documented in the History of Present Illness, Review of Systems and Physical Exam were personally obtained by me. Portions of this information were initially documented by [facility] and reviewed by me for thoroughness and accuracy.       Trinna Post, PA-C  [facility]

## 2019-03-27 NOTE — Telephone Encounter (Signed)
Patient notified she states that she will follow up with PCP

## 2019-03-27 NOTE — Telephone Encounter (Signed)
Spoke with patient, she states she believes she has a yeast infection due to the antibiotics. She is using OTC cream with no relief and is miserable. She would like for [place] to send in medication to help with this. She was also encouraged to take OTC probiotic

## 2019-03-27 NOTE — Telephone Encounter (Signed)
She needs to be seen by her PCP or in our clinic for evaluation of her genital rash before being prescribed an empiric antifungal.

## 2019-03-28 NOTE — Patient Instructions (Signed)
Vaginal Yeast Infection, Adult  Vaginal yeast infection is a condition that causes vaginal discharge as well as soreness, swelling, and redness (inflammation) of the vagina. This is a common condition. Some women get this infection frequently. What are the causes? This condition is caused by a change in the normal balance of the yeast (candida) and bacteria that live in the vagina. This change causes an overgrowth of yeast, which causes the inflammation. What increases the risk? The condition is more likely to develop in women who:  Take antibiotic medicines.  Have diabetes.  Take birth control pills.  Are pregnant.  Douche often.  Have a weak body defense system (immune system).  Have been taking steroid medicines for a long time.  Frequently wear tight clothing. What are the signs or symptoms? Symptoms of this condition include:  White, thick, creamy vaginal discharge.  Swelling, itching, redness, and irritation of the vagina. The lips of the vagina (vulva) may be affected as well.  Pain or a burning feeling while urinating.  Pain during sex. How is this diagnosed? This condition is diagnosed based on:  Your medical history.  A physical exam.  A pelvic exam. Your health care provider will examine a sample of your vaginal discharge under a microscope. Your health care provider may send this sample for testing to confirm the diagnosis. How is this treated? This condition is treated with medicine. Medicines may be over-the-counter or prescription. You may be told to use one or more of the following:  Medicine that is taken by mouth (orally).  Medicine that is applied as a cream (topically).  Medicine that is inserted directly into the vagina (suppository). Follow these instructions at home:  Lifestyle  Do not have sex until your health care provider approves. Tell your sex partner that you have a yeast infection. That person should go to his or her health care  provider and ask if they should also be treated.  Do not wear tight clothes, such as pantyhose or tight pants.  Wear breathable cotton underwear. General instructions  Take or apply over-the-counter and prescription medicines only as told by your health care provider.  Eat more yogurt. This may help to keep your yeast infection from returning.  Do not use tampons until your health care provider approves.  Try taking a sitz bath to help with discomfort. This is a warm water bath that is taken while you are sitting down. The water should only come up to your hips and should cover your buttocks. Do this 3-4 times per day or as told by your health care provider.  Do not douche.  If you have diabetes, keep your blood sugar levels under control.  Keep all follow-up visits as told by your health care provider. This is important. Contact a health care provider if:  You have a fever.  Your symptoms go away and then return.  Your symptoms do not get better with treatment.  Your symptoms get worse.  You have new symptoms.  You develop blisters in or around your vagina.  You have blood coming from your vagina and it is not your menstrual period.  You develop pain in your abdomen. Summary  Vaginal yeast infection is a condition that causes discharge as well as soreness, swelling, and redness (inflammation) of the vagina.  This condition is treated with medicine. Medicines may be over-the-counter or prescription.  Take or apply over-the-counter and prescription medicines only as told by your health care provider.  Do not douche.   Do not have sex or use tampons until your health care provider approves.  Contact a health care provider if your symptoms do not get better with treatment or your symptoms go away and then return. This information is not intended to replace advice given to you by your health care provider. Make sure you discuss any questions you have with your health care  provider. Document Revised: 08/16/2018 Document Reviewed: 06/04/2017 Elsevier Patient Education  2020 [employer]  

## 2019-04-01 ENCOUNTER — Ambulatory Visit: Payer: Self-pay | Admitting: Family Medicine

## 2019-04-01 ENCOUNTER — Ambulatory Visit: Payer: Self-pay

## 2019-04-02 NOTE — Progress Notes (Signed)
Patient: Tracy Mann Female    DOB: 1933-11-19   86 y.o.   MRN: [id number] Visit Date: 04/03/2019  Today's Provider: Wilhemena Durie, MD   No chief complaint on file.  Subjective:     Patient presents today for possible yeast infection, was prescribed Terconazole cream on 2/25  but patient says that is not helping. Patient is having itching and burning. Patient also stopped taking cephalaxin because she was concerned that it was causing the yeast infection.  Patient has been treated for several UTIs recently and has had these skin issues since the onset of antibiotics.  The skin is not tender but it is pruritic.  It is erythematous.  No vaginal discharge.  Allergies  Allergen Reactions  . Other Anaphylaxis and Swelling    'tongue swelling'  . Sulfa Antibiotics Anaphylaxis  . Clarithromycin Other (See Comments)    Hives, headaches, hard time swelling, felt like throat was closing up Hives, headaches, hard time swelling, felt like throat was closing up  . Diphenhydramine Other (See Comments)  . Estrogens Conjugated   . Estrogens, Conjugated Other (See Comments)  . Sulfonamide Derivatives Swelling    'tongue swelling'  . Nitrofurantoin Nausea Only     Current Outpatient Medications:  .  ACCU-CHEK AVIVA PLUS test strip, USE WITH METER TO CHECK GLUCOSE BEFORE MEALS AND AT BEDTIME, Disp: 200 strip, Rfl: 12 .  clotrimazole-betamethasone (LOTRISONE) cream, Apply 1 application topically 2 (two) times daily., Disp: 30 g, Rfl: 0 .  Coenzyme Q10 (CO Q 10 PO), Take by mouth., Disp: , Rfl:  .  COMBIVENT RESPIMAT 20-100 MCG/ACT AERS respimat, 1 puff every 6 (six) hours as needed. , Disp: , Rfl:  .  diltiazem (CARDIZEM) 30 MG tablet, TAKE 1 TABLET (30 MG TOTAL) BY MOUTH 3 (THREE) TIMES DAILY AS NEEDED., Disp: 270 tablet, Rfl: 0 .  ezetimibe (ZETIA) 10 MG tablet, TAKE 1 TABLET BY MOUTH EVERY DAY, Disp: 90 tablet, Rfl: 3 .  flavoxATE (URISPAS) 100 MG tablet, Take 1 tablet (100 mg  total) by mouth 3 (three) times daily as needed for bladder spasms., Disp: 30 tablet, Rfl: 0 .  fluticasone (FLONASE) 50 MCG/ACT nasal spray, SPRAY TWO SPRAYS IN EACH NOSTRIL ONCE DAILY, Disp: 48 g, Rfl: 3 .  [START ON 05/22/2019] HYDROcodone-acetaminophen (NORCO) 10-325 MG tablet, Take 1 tablet by mouth every 6 (six) hours as needed., Disp: 120 tablet, Rfl: 0 .  [START ON 04/21/2019] HYDROcodone-acetaminophen (NORCO) 10-325 MG tablet, Take 1 tablet by mouth every 6 (six) hours as needed., Disp: 120 tablet, Rfl: 0 .  HYDROcodone-acetaminophen (NORCO) 10-325 MG tablet, Take 1 tablet by mouth every 6 (six) hours as needed., Disp: 120 tablet, Rfl: 0 .  insulin lispro (HUMALOG KWIKPEN) 100 UNIT/ML KwikPen, INJECT 10-20 UNITS THREE TIMES DAILY WITH EACH MEAL., Disp: 15 mL, Rfl: 12 .  Insulin Pen Needle (RELION PEN NEEDLE 31G/8MM) 31G X 8 MM MISC, Use with insulin pen three times daily, Disp: 100 each, Rfl: 12 .  levothyroxine (SYNTHROID) 125 MCG tablet, TAKE 1 TABLET (125 MCG TOTAL) BY MOUTH DAILY BEFORE BREAKFAST., Disp: 90 tablet, Rfl: 4 .  lisinopril (ZESTRIL) 2.5 MG tablet, TAKE 1 TABLET BY MOUTH EVERYDAY AT BEDTIME, Disp: 90 tablet, Rfl: 1 .  LORazepam (ATIVAN) 1 MG tablet, TAKE 1 TABLET BY MOUTH TWICE A DAY AS NEEDED, Disp: 60 tablet, Rfl: 1 .  Magnesium 500 MG CAPS, Take 500 mg by mouth daily. , Disp: , Rfl:  .  mirabegron ER (MYRBETRIQ) 50 MG TB24 tablet, Take 1 tablet (50 mg total) by mouth daily., Disp: 30 tablet, Rfl: 11 .  nitrofurantoin, macrocrystal-monohydrate, (MACROBID) 100 MG capsule, Take 1 capsule (100 mg total) by mouth daily., Disp: 30 capsule, Rfl: 2 .  nitroGLYCERIN (NITROSTAT) 0.4 MG SL tablet, Place 1 tablet (0.4 mg total) under the tongue every 5 (five) minutes as needed for chest pain., Disp: 25 tablet, Rfl: 3 .  pantoprazole (PROTONIX) 20 MG tablet, Take 1 tablet (20 mg total) by mouth daily., Disp: 30 tablet, Rfl: 6 .  terconazole (TERAZOL 7) 0.4 % vaginal cream, One thin layer  twice daily., Disp: 45 g, Rfl: 0 .  trimethoprim (TRIMPEX) 100 MG tablet, Take 1 tablet (100 mg total) by mouth daily., Disp: 30 tablet, Rfl: 11 .  Vitamin D, Ergocalciferol, (DRISDOL) 50000 units CAPS capsule, Take 50,000 Units by mouth. , Disp: , Rfl:  .  warfarin (COUMADIN) 2 MG tablet, Take 1 tablet (2 mg total) by mouth daily., Disp: 30 tablet, Rfl: 3 .  warfarin (COUMADIN) 2.5 MG tablet, TAKE 1 TABLET BY MOUTH EVERY DAY, Disp: 30 tablet, Rfl: 5 .  warfarin (COUMADIN) 3 MG tablet, Take 1 tablet (3 mg total) by mouth daily., Disp: 30 tablet, Rfl: 12 .  warfarin (COUMADIN) 4 MG tablet, Take 1 tablet (4 mg total) by mouth daily., Disp: 30 tablet, Rfl: 12 .  furosemide (LASIX) 20 MG tablet, Take 1 tablet (20 mg total) by mouth 2 (two) times daily., Disp: 180 tablet, Rfl: 3  Review of Systems  Constitutional: Negative for appetite change, fatigue and fever.  HENT: Negative.   Eyes: Negative.   Respiratory: Negative for chest tightness and shortness of breath.   Cardiovascular: Negative for chest pain and palpitations.  Gastrointestinal: Negative for abdominal pain.  Endocrine: Negative.   Allergic/Immunologic: Negative.   Neurological: Negative for dizziness and weakness.  Hematological: Negative.   Psychiatric/Behavioral: Negative.     Social History   Tobacco Use  . Smoking status: Never Smoker  . Smokeless tobacco: Never Used  Substance Use Topics  . Alcohol use: No      Objective:   There were no vitals taken for this visit. There were no vitals filed for this visit.There is no height or weight on file to calculate BMI.   Physical Exam Vitals reviewed.  Constitutional:      Appearance: She is obese.  HENT:     Head: Normocephalic and atraumatic.  Eyes:     General: No scleral icterus. Cardiovascular:     Rate and Rhythm: Normal rate and regular rhythm.     Heart sounds: Normal heart sounds.  Pulmonary:     Breath sounds: Normal breath sounds.  Abdominal:      Palpations: Abdomen is soft.  Skin:    General: Skin is warm and dry.     Findings: Rash present.     Comments: Rash in the perineum in the lower pelvis consistent with possible yeast dermatitis.  Neurological:     Mental Status: She is alert and oriented to person, place, and time.  Psychiatric:        Mood and Affect: Mood normal.        Behavior: Behavior normal.        Thought Content: Thought content normal.        Judgment: Judgment normal.      No results found for any visits on 04/03/19.     Assessment & Plan    1.  Yeast infection May need dermatology referral if this does not resolve. - fluconazole (DIFLUCAN) 100 MG tablet; Take 1 tablet (100 mg total) by mouth daily.  Dispense: 3 tablet; Refill: 0 - POCT Wet Prep [facility])  2. Class 2 severe obesity due to excess calories with serious comorbidity and body mass index (BMI) of 39.0 to 39.9 in adult [facility]) Diet changes and exercise stressed.     Richard Cranford Mon, MD  [facility]

## 2019-04-03 ENCOUNTER — Ambulatory Visit (INDEPENDENT_AMBULATORY_CARE_PROVIDER_SITE_OTHER): Payer: Medicare Other | Admitting: Family Medicine

## 2019-04-03 ENCOUNTER — Encounter: Payer: Self-pay | Admitting: Family Medicine

## 2019-04-03 ENCOUNTER — Other Ambulatory Visit: Payer: Self-pay

## 2019-04-03 VITALS — BP 116/77 | HR 80 | Temp 96.2°F | Wt 239.0 lb

## 2019-04-03 DIAGNOSIS — B379 Candidiasis, unspecified: Secondary | ICD-10-CM

## 2019-04-03 DIAGNOSIS — Z6839 Body mass index (BMI) 39.0-39.9, adult: Secondary | ICD-10-CM

## 2019-04-03 LAB — POCT WET PREP (WET MOUNT)

## 2019-04-03 MED ORDER — FLUCONAZOLE 100 MG PO TABS: 100.0000 mg | ORAL_TABLET | Freq: Every day | ORAL | 0 refills | Status: DC

## 2019-04-04 ENCOUNTER — Ambulatory Visit: Admission: RE | Admit: 2019-04-04 | Payer: Medicare Other | Source: Ambulatory Visit

## 2019-04-06 NOTE — Progress Notes (Deleted)
Cardiology Office Note  Date:  04/06/2019   ID:  Tracy Mann, Tracy Mann November 05, 1933, MRN [id number]  PCP:  Jerrol Banana., MD   No chief complaint on file.   HPI:  Tracy Mann is a 86 year old woman with a PMH significant for  chronic atrial fibrillation on Coumadin therapy,  diastolic dysfunction, EF 40 to 45% on echo 01/2018  hypertension,  hyperlipidemia,  morbid obesity,   moderate pulmonary hypertension inferior wall myocardial infarction felt to be secondary to a thrombus from atrial fib in October 2007 at [facility],   episodes of chest pain  who presents for followup Of her coronary artery disease and leg edema.  In follow-up today she reports that shortness of breath is improving Reports eating less, Tried torsemide for several days but it caused worsening urethral irritation and she went back to Lasix 20 daily Weight down 13 pounds since early 11/20  She is concerned about Migrating arthalgias Wonders if she needs to see rheumatology Was talking to a nurse from the insurance company Leg pain below the knees,  She continues to have severe lower extremity edema  Not very active, concerned that her weight is over 200 pounds No regular exercise, Does not know how to get her strength back  Previous x-ray reviewed with her showing pulmonary edema beginning of November 2020  Unable to wear compression hose  EKG personally reviewed by myself on todays visit Shows atrial fibrillation ventricular rate 90 bpm intraventricular conduction delay  Lab work reviewed with her HBA1C 8.7  Monitor, reviewed in detail today Atrial fibrillation, 100% burden avg of 83 bpm  Other past medical history reviewed In follow-up today, she reports that she had an episode of chest pain 05/21/2015 on the left side She went to the emergency room, was admitted. Hospital records reviewed. Cardiac enzymes were unrevealing, EKG unchanged. She had stress Myoview showing  old inferior MI with no ischemia consistent with her known anatomy. She did not take nitroglycerin for her symptoms.  Previously seen by vein and vascular, Dr. Ronalee Belts, recommended to wear TED hose or ACE wraps It was mentioned that perhaps she could wear lymphedema compression pump.    PMH:   has a past medical history of Acute myocardial infarction of inferior wall ([place]), Arthritis, Chronic anticoagulation, Chronic atrial fibrillation ([place]), Chronic combined systolic and diastolic CHF (congestive heart failure) ([place]), Essential hypertension, Hyperlipidemia, mixed, Mixed Ischemic/Nonischemic Cardiomyopathy, Nonobstructive coronary artery disease, Obesity, Thyroid disease, Type II diabetes mellitus ([place]), Venous insufficiency, and Vitamin D deficiency.  PSH:    Past Surgical History:  Procedure Laterality Date  . CARDIAC CATHETERIZATION  11/2012   [facility];EF 45-50%  . CHOLECYSTECTOMY  1999  . TUBAL LIGATION  1968  . VESICOVAGINAL FISTULA CLOSURE W/ TAH  1996    Current Outpatient Medications  Medication Sig Dispense Refill  . ACCU-CHEK AVIVA PLUS test strip USE WITH METER TO CHECK GLUCOSE BEFORE MEALS AND AT BEDTIME 200 strip 12  . clotrimazole-betamethasone (LOTRISONE) cream Apply 1 application topically 2 (two) times daily. 30 g 0  . Coenzyme Q10 (CO Q 10 PO) Take by mouth.    . COMBIVENT RESPIMAT 20-100 MCG/ACT AERS respimat 1 puff every 6 (six) hours as needed.     . diltiazem (CARDIZEM) 30 MG tablet TAKE 1 TABLET (30 MG TOTAL) BY MOUTH 3 (THREE) TIMES DAILY AS NEEDED. 270 tablet 0  . ezetimibe (ZETIA) 10 MG tablet TAKE 1 TABLET BY MOUTH EVERY DAY 90 tablet 3  .  flavoxATE (URISPAS) 100 MG tablet Take 1 tablet (100 mg total) by mouth 3 (three) times daily as needed for bladder spasms. 30 tablet 0  . fluconazole (DIFLUCAN) 100 MG tablet Take 1 tablet (100 mg total) by mouth daily. 3 tablet 0  . fluticasone (FLONASE) 50 MCG/ACT nasal spray SPRAY TWO SPRAYS IN EACH NOSTRIL ONCE DAILY 48 g  3  . furosemide (LASIX) 20 MG tablet Take 1 tablet (20 mg total) by mouth 2 (two) times daily. 180 tablet 3  . [START ON 05/22/2019] HYDROcodone-acetaminophen (NORCO) 10-325 MG tablet Take 1 tablet by mouth every 6 (six) hours as needed. 120 tablet 0  . [START ON 04/21/2019] HYDROcodone-acetaminophen (NORCO) 10-325 MG tablet Take 1 tablet by mouth every 6 (six) hours as needed. 120 tablet 0  . HYDROcodone-acetaminophen (NORCO) 10-325 MG tablet Take 1 tablet by mouth every 6 (six) hours as needed. 120 tablet 0  . insulin lispro (HUMALOG KWIKPEN) 100 UNIT/ML KwikPen INJECT 10-20 UNITS THREE TIMES DAILY WITH EACH MEAL. 15 mL 12  . Insulin Pen Needle (RELION PEN NEEDLE 31G/8MM) 31G X 8 MM MISC Use with insulin pen three times daily 100 each 12  . levothyroxine (SYNTHROID) 125 MCG tablet TAKE 1 TABLET (125 MCG TOTAL) BY MOUTH DAILY BEFORE BREAKFAST. 90 tablet 4  . lisinopril (ZESTRIL) 2.5 MG tablet TAKE 1 TABLET BY MOUTH EVERYDAY AT BEDTIME 90 tablet 1  . LORazepam (ATIVAN) 1 MG tablet TAKE 1 TABLET BY MOUTH TWICE A DAY AS NEEDED 60 tablet 1  . Magnesium 500 MG CAPS Take 500 mg by mouth daily.     . mirabegron ER (MYRBETRIQ) 50 MG TB24 tablet Take 1 tablet (50 mg total) by mouth daily. 30 tablet 11  . nitrofurantoin, macrocrystal-monohydrate, (MACROBID) 100 MG capsule Take 1 capsule (100 mg total) by mouth daily. 30 capsule 2  . nitroGLYCERIN (NITROSTAT) 0.4 MG SL tablet Place 1 tablet (0.4 mg total) under the tongue every 5 (five) minutes as needed for chest pain. 25 tablet 3  . pantoprazole (PROTONIX) 20 MG tablet Take 1 tablet (20 mg total) by mouth daily. 30 tablet 6  . terconazole (TERAZOL 7) 0.4 % vaginal cream One thin layer twice daily. 45 g 0  . trimethoprim (TRIMPEX) 100 MG tablet Take 1 tablet (100 mg total) by mouth daily. 30 tablet 11  . Vitamin D, Ergocalciferol, (DRISDOL) 50000 units CAPS capsule Take 50,000 Units by mouth.     . warfarin (COUMADIN) 2 MG tablet Take 1 tablet (2 mg total) by  mouth daily. 30 tablet 3  . warfarin (COUMADIN) 2.5 MG tablet TAKE 1 TABLET BY MOUTH EVERY DAY 30 tablet 5  . warfarin (COUMADIN) 3 MG tablet Take 1 tablet (3 mg total) by mouth daily. 30 tablet 12  . warfarin (COUMADIN) 4 MG tablet Take 1 tablet (4 mg total) by mouth daily. 30 tablet 12   No current facility-administered medications for this visit.    Allergies:   Other; Sulfa antibiotics; Clarithromycin; Diphenhydramine; Estrogens conjugated; Estrogens, conjugated; Sulfonamide derivatives; and Nitrofurantoin   Social History:  The patient  reports that she has never smoked. She has never used smokeless tobacco. She reports that she does not drink alcohol or use drugs.   Family History:   family history includes Heart disease in her mother; Thyroid disease in her father.    Review of Systems: Review of Systems  Constitutional: Negative.   HENT: Negative.   Respiratory: Negative.   Cardiovascular: Positive for leg swelling.  Gastrointestinal: Negative.  Musculoskeletal: Positive for joint pain.  Neurological: Negative.   Psychiatric/Behavioral: Negative.   All other systems reviewed and are negative.   PHYSICAL EXAM: VS:  There were no vitals taken for this visit. , BMI There is no height or weight on file to calculate BMI.  Constitutional:  oriented to person, place, and time. No distress.  Obese HENT:  Head: Grossly normal Eyes:  no discharge. No scleral icterus.  Neck: No JVD, no carotid bruits  Cardiovascular:  irregularly irregular no murmurs appreciated 2+ pitting edema below the knees Pulmonary/Chest: Clear to auscultation bilaterally, no wheezes or rails Abdominal: Soft.  no distension.  no tenderness.  Musculoskeletal: Normal range of motion Neurological:  normal muscle tone. Coordination normal. No atrophy Skin: Skin warm and dry Psychiatric: normal affect, pleasant   Recent Labs: 04/30/2018: NT-Pro BNP 531 08/29/2018: ALT 13; BUN 18; Creatinine, Ser 0.78;  Hemoglobin 13.5; Magnesium 2.1; Platelets 225; Potassium 4.9; Sodium 139; TSH 0.888    Lipid Panel Lab Results  Component Value Date   CHOL 172 01/17/2019   HDL 57 01/17/2019   LDLCALC 94 01/17/2019   TRIG 119 01/17/2019      Wt Readings from Last 3 Encounters:  04/03/19 239 lb (108.4 kg)  03/27/19 231 lb 9.6 oz (105.1 kg)  03/24/19 230 lb 12.8 oz (104.7 kg)     ASSESSMENT AND PLAN: Chest pain Denies having any chest pain at this time No further testing  Acute on chronic diastolic CHF Did not tolerate torsemide secondary to bladder/urethral irritation Recommend she stay on Lasix 20 daily, try for 20 twice daily Down 13 pounds from prior clinic visit by taking Lasix consistently And moderating her salt and fluid intake Still with significant leg edema that is likely painful, noted below the knees bilaterally Currently taking pain medications for pain from the swelling Unable to exclude arthritic issue causing her pain Would hope we can improve her pain with continued diuresis  Mixed hyperlipidemia Continue Zetia Statin intolerance  Essential hypertension - Plan: EKG 12-Lead No changes to her blood pressure medication  Atherosclerosis of native coronary artery of native heart without angina pectoris Prior inferior MI Echo showing stable ejection fraction 45%  No further ischemic work-up at this time  Mixed Ischemic/Nonischemic Cardiomyopathy - Plan: EKG 12-Lead On last clinic visit,  not seem particularly interested in lymphedema compression pumps.  Previously seen by Dr. Ronalee Belts -Recommend we continue Lasix 20 daily if not 20 twice daily if tolerated  Chronic atrial fibrillation ([place]) - Plan: EKG 12-Lead Tolerating warfarin, rate well controlled Bradycardia on metoprolol Rate is well controlled  Obstructive sleep apnea Stressed importance of staying on CPAP  Type 2 diabetes mellitus without complication, with long-term current use of insulin ([place]) Managed by  primary care and endocrine HBA1C greater than 8 Reports that she is eating less  PVCs Asymptomatic, seen on EKG today  Pauses Likely secondary to sleep apnea, not wearing her CPAP Has 4-second pauses sparingly 3 in 2 weeks time when sleeping Stressed importance of wearing her CPAP No near syncope   Total encounter time more than 25 minutes  Greater than 50% was spent in counseling and coordination of care with the patient  Follow-up 3 weeks   Total encounter time more than 25 minutes  Greater than 50% was spent in counseling and coordination of care with the patient    No orders of the defined types were placed in this encounter.    Mila Merry, M.D., Ph.D. 04/06/2019  [facility]  [place], [place]

## 2019-04-07 ENCOUNTER — Ambulatory Visit: Payer: Medicare Other | Admitting: Cardiovascular Disease

## 2019-04-07 ENCOUNTER — Other Ambulatory Visit: Payer: Self-pay | Admitting: Family Medicine

## 2019-04-07 ENCOUNTER — Telehealth: Payer: Self-pay

## 2019-04-07 NOTE — Telephone Encounter (Signed)
Refill request for LORAZEPAM 1 MG TABLET.

## 2019-04-07 NOTE — Telephone Encounter (Signed)
Copied from [place] [id number]. Topic: General - Other >> Apr 07, 2019  3:32 PM Tracy Mann wrote: Reason for CRM: Pt called stating that her rash is continuing to spread. Pt state that she started developing cramps in her legs, her fingers began spreading, and two of them became limp. Pt is requesting to have the nurse give her a call back because it is getting worse. Please advise.

## 2019-04-07 NOTE — Telephone Encounter (Signed)
Patient scheduled for appointment.

## 2019-04-08 ENCOUNTER — Ambulatory Visit (INDEPENDENT_AMBULATORY_CARE_PROVIDER_SITE_OTHER): Payer: Medicare Other

## 2019-04-08 ENCOUNTER — Ambulatory Visit: Payer: Medicare Other | Admitting: Cardiovascular Disease

## 2019-04-08 ENCOUNTER — Other Ambulatory Visit: Payer: Self-pay

## 2019-04-08 DIAGNOSIS — I482 Chronic atrial fibrillation, unspecified: Secondary | ICD-10-CM

## 2019-04-08 LAB — POCT INR
INR: 5 — AB (ref 2.0–3.0)
PT: 60.1

## 2019-04-08 NOTE — Patient Instructions (Signed)
Description   Hold for 3 days then take 3mg  daily. F/U 1 weeks

## 2019-04-08 NOTE — Progress Notes (Deleted)
Patient: Tracy Mann Female    DOB: 1933-11-15   86 y.o.   MRN: [id number] Visit Date: 04/08/2019  Today's Provider: Wilhemena Durie, MD   No chief complaint on file.  Subjective:     HPI  Allergies  Allergen Reactions  . Other Anaphylaxis and Swelling    'tongue swelling'  . Sulfa Antibiotics Anaphylaxis  . Clarithromycin Other (See Comments)    Hives, headaches, hard time swelling, felt like throat was closing up Hives, headaches, hard time swelling, felt like throat was closing up  . Diphenhydramine Other (See Comments)  . Estrogens Conjugated   . Estrogens, Conjugated Other (See Comments)  . Sulfonamide Derivatives Swelling    'tongue swelling'  . Nitrofurantoin Nausea Only     Current Outpatient Medications:  .  ACCU-CHEK AVIVA PLUS test strip, USE WITH METER TO CHECK GLUCOSE BEFORE MEALS AND AT BEDTIME, Disp: 200 strip, Rfl: 12 .  clotrimazole-betamethasone (LOTRISONE) cream, Apply 1 application topically 2 (two) times daily., Disp: 30 g, Rfl: 0 .  Coenzyme Q10 (CO Q 10 PO), Take by mouth., Disp: , Rfl:  .  COMBIVENT RESPIMAT 20-100 MCG/ACT AERS respimat, 1 puff every 6 (six) hours as needed. , Disp: , Rfl:  .  diltiazem (CARDIZEM) 30 MG tablet, TAKE 1 TABLET (30 MG TOTAL) BY MOUTH 3 (THREE) TIMES DAILY AS NEEDED., Disp: 270 tablet, Rfl: 0 .  ezetimibe (ZETIA) 10 MG tablet, TAKE 1 TABLET BY MOUTH EVERY DAY, Disp: 90 tablet, Rfl: 3 .  flavoxATE (URISPAS) 100 MG tablet, Take 1 tablet (100 mg total) by mouth 3 (three) times daily as needed for bladder spasms., Disp: 30 tablet, Rfl: 0 .  fluconazole (DIFLUCAN) 100 MG tablet, Take 1 tablet (100 mg total) by mouth daily., Disp: 3 tablet, Rfl: 0 .  fluticasone (FLONASE) 50 MCG/ACT nasal spray, SPRAY TWO SPRAYS IN EACH NOSTRIL ONCE DAILY, Disp: 48 g, Rfl: 3 .  furosemide (LASIX) 20 MG tablet, Take 1 tablet (20 mg total) by mouth 2 (two) times daily., Disp: 180 tablet, Rfl: 3 .  [START ON 05/22/2019]  HYDROcodone-acetaminophen (NORCO) 10-325 MG tablet, Take 1 tablet by mouth every 6 (six) hours as needed., Disp: 120 tablet, Rfl: 0 .  [START ON 04/21/2019] HYDROcodone-acetaminophen (NORCO) 10-325 MG tablet, Take 1 tablet by mouth every 6 (six) hours as needed., Disp: 120 tablet, Rfl: 0 .  HYDROcodone-acetaminophen (NORCO) 10-325 MG tablet, Take 1 tablet by mouth every 6 (six) hours as needed., Disp: 120 tablet, Rfl: 0 .  insulin lispro (HUMALOG KWIKPEN) 100 UNIT/ML KwikPen, INJECT 10-20 UNITS THREE TIMES DAILY WITH EACH MEAL., Disp: 15 mL, Rfl: 12 .  Insulin Pen Needle (RELION PEN NEEDLE 31G/8MM) 31G X 8 MM MISC, Use with insulin pen three times daily, Disp: 100 each, Rfl: 12 .  levothyroxine (SYNTHROID) 125 MCG tablet, TAKE 1 TABLET (125 MCG TOTAL) BY MOUTH DAILY BEFORE BREAKFAST., Disp: 90 tablet, Rfl: 4 .  lisinopril (ZESTRIL) 2.5 MG tablet, TAKE 1 TABLET BY MOUTH EVERYDAY AT BEDTIME, Disp: 90 tablet, Rfl: 1 .  LORazepam (ATIVAN) 1 MG tablet, TAKE 1 TABLET BY MOUTH TWICE A DAY AS NEEDED, Disp: 60 tablet, Rfl: 1 .  Magnesium 500 MG CAPS, Take 500 mg by mouth daily. , Disp: , Rfl:  .  mirabegron ER (MYRBETRIQ) 50 MG TB24 tablet, Take 1 tablet (50 mg total) by mouth daily., Disp: 30 tablet, Rfl: 11 .  nitrofurantoin, macrocrystal-monohydrate, (MACROBID) 100 MG capsule, Take 1 capsule (100 mg  total) by mouth daily., Disp: 30 capsule, Rfl: 2 .  nitroGLYCERIN (NITROSTAT) 0.4 MG SL tablet, Place 1 tablet (0.4 mg total) under the tongue every 5 (five) minutes as needed for chest pain., Disp: 25 tablet, Rfl: 3 .  pantoprazole (PROTONIX) 20 MG tablet, Take 1 tablet (20 mg total) by mouth daily., Disp: 30 tablet, Rfl: 6 .  terconazole (TERAZOL 7) 0.4 % vaginal cream, One thin layer twice daily., Disp: 45 g, Rfl: 0 .  trimethoprim (TRIMPEX) 100 MG tablet, Take 1 tablet (100 mg total) by mouth daily., Disp: 30 tablet, Rfl: 11 .  Vitamin D, Ergocalciferol, (DRISDOL) 50000 units CAPS capsule, Take 50,000 Units  by mouth. , Disp: , Rfl:  .  warfarin (COUMADIN) 2 MG tablet, Take 1 tablet (2 mg total) by mouth daily., Disp: 30 tablet, Rfl: 3 .  warfarin (COUMADIN) 2.5 MG tablet, TAKE 1 TABLET BY MOUTH EVERY DAY, Disp: 30 tablet, Rfl: 5 .  warfarin (COUMADIN) 3 MG tablet, Take 1 tablet (3 mg total) by mouth daily., Disp: 30 tablet, Rfl: 12 .  warfarin (COUMADIN) 4 MG tablet, Take 1 tablet (4 mg total) by mouth daily., Disp: 30 tablet, Rfl: 12  Review of Systems  Constitutional: Negative for appetite change, chills, fatigue and fever.  Respiratory: Negative for chest tightness and shortness of breath.   Cardiovascular: Negative for chest pain and palpitations.  Gastrointestinal: Negative for abdominal pain, nausea and vomiting.  Skin: Positive for rash.  Neurological: Negative for dizziness and weakness.    Social History   Tobacco Use  . Smoking status: Never Smoker  . Smokeless tobacco: Never Used  Substance Use Topics  . Alcohol use: No      Objective:   There were no vitals taken for this visit. There were no vitals filed for this visit.There is no height or weight on file to calculate BMI.   Physical Exam   No results found for any visits on 04/09/19.     Assessment & Plan        Wilhemena Durie, MD  [facility]

## 2019-04-09 ENCOUNTER — Ambulatory Visit: Payer: Self-pay | Admitting: Family Medicine

## 2019-04-09 NOTE — Progress Notes (Deleted)
Patient: Tracy Mann Female    DOB: 1933/02/24   86 y.o.   MRN: [id number] Visit Date: 04/09/2019  Today's Provider: Wilhemena Durie, MD   No chief complaint on file.  Subjective:     HPI  Allergies  Allergen Reactions  . Other Anaphylaxis and Swelling    'tongue swelling'  . Sulfa Antibiotics Anaphylaxis  . Clarithromycin Other (See Comments)    Hives, headaches, hard time swelling, felt like throat was closing up Hives, headaches, hard time swelling, felt like throat was closing up  . Diphenhydramine Other (See Comments)  . Estrogens Conjugated   . Estrogens, Conjugated Other (See Comments)  . Sulfonamide Derivatives Swelling    'tongue swelling'  . Nitrofurantoin Nausea Only     Current Outpatient Medications:  .  ACCU-CHEK AVIVA PLUS test strip, USE WITH METER TO CHECK GLUCOSE BEFORE MEALS AND AT BEDTIME, Disp: 200 strip, Rfl: 12 .  clotrimazole-betamethasone (LOTRISONE) cream, Apply 1 application topically 2 (two) times daily., Disp: 30 g, Rfl: 0 .  Coenzyme Q10 (CO Q 10 PO), Take by mouth., Disp: , Rfl:  .  COMBIVENT RESPIMAT 20-100 MCG/ACT AERS respimat, 1 puff every 6 (six) hours as needed. , Disp: , Rfl:  .  diltiazem (CARDIZEM) 30 MG tablet, TAKE 1 TABLET (30 MG TOTAL) BY MOUTH 3 (THREE) TIMES DAILY AS NEEDED., Disp: 270 tablet, Rfl: 0 .  ezetimibe (ZETIA) 10 MG tablet, TAKE 1 TABLET BY MOUTH EVERY DAY, Disp: 90 tablet, Rfl: 3 .  flavoxATE (URISPAS) 100 MG tablet, Take 1 tablet (100 mg total) by mouth 3 (three) times daily as needed for bladder spasms., Disp: 30 tablet, Rfl: 0 .  fluconazole (DIFLUCAN) 100 MG tablet, Take 1 tablet (100 mg total) by mouth daily., Disp: 3 tablet, Rfl: 0 .  fluticasone (FLONASE) 50 MCG/ACT nasal spray, SPRAY TWO SPRAYS IN EACH NOSTRIL ONCE DAILY, Disp: 48 g, Rfl: 3 .  furosemide (LASIX) 20 MG tablet, Take 1 tablet (20 mg total) by mouth 2 (two) times daily., Disp: 180 tablet, Rfl: 3 .  [START ON 05/22/2019]  HYDROcodone-acetaminophen (NORCO) 10-325 MG tablet, Take 1 tablet by mouth every 6 (six) hours as needed., Disp: 120 tablet, Rfl: 0 .  [START ON 04/21/2019] HYDROcodone-acetaminophen (NORCO) 10-325 MG tablet, Take 1 tablet by mouth every 6 (six) hours as needed., Disp: 120 tablet, Rfl: 0 .  HYDROcodone-acetaminophen (NORCO) 10-325 MG tablet, Take 1 tablet by mouth every 6 (six) hours as needed., Disp: 120 tablet, Rfl: 0 .  insulin lispro (HUMALOG KWIKPEN) 100 UNIT/ML KwikPen, INJECT 10-20 UNITS THREE TIMES DAILY WITH EACH MEAL., Disp: 15 mL, Rfl: 12 .  Insulin Pen Needle (RELION PEN NEEDLE 31G/8MM) 31G X 8 MM MISC, Use with insulin pen three times daily, Disp: 100 each, Rfl: 12 .  levothyroxine (SYNTHROID) 125 MCG tablet, TAKE 1 TABLET (125 MCG TOTAL) BY MOUTH DAILY BEFORE BREAKFAST., Disp: 90 tablet, Rfl: 4 .  lisinopril (ZESTRIL) 2.5 MG tablet, TAKE 1 TABLET BY MOUTH EVERYDAY AT BEDTIME, Disp: 90 tablet, Rfl: 1 .  LORazepam (ATIVAN) 1 MG tablet, TAKE 1 TABLET BY MOUTH TWICE A DAY AS NEEDED, Disp: 60 tablet, Rfl: 1 .  Magnesium 500 MG CAPS, Take 500 mg by mouth daily. , Disp: , Rfl:  .  mirabegron ER (MYRBETRIQ) 50 MG TB24 tablet, Take 1 tablet (50 mg total) by mouth daily., Disp: 30 tablet, Rfl: 11 .  nitrofurantoin, macrocrystal-monohydrate, (MACROBID) 100 MG capsule, Take 1 capsule (100 mg  total) by mouth daily., Disp: 30 capsule, Rfl: 2 .  nitroGLYCERIN (NITROSTAT) 0.4 MG SL tablet, Place 1 tablet (0.4 mg total) under the tongue every 5 (five) minutes as needed for chest pain., Disp: 25 tablet, Rfl: 3 .  pantoprazole (PROTONIX) 20 MG tablet, Take 1 tablet (20 mg total) by mouth daily., Disp: 30 tablet, Rfl: 6 .  terconazole (TERAZOL 7) 0.4 % vaginal cream, One thin layer twice daily., Disp: 45 g, Rfl: 0 .  trimethoprim (TRIMPEX) 100 MG tablet, Take 1 tablet (100 mg total) by mouth daily., Disp: 30 tablet, Rfl: 11 .  Vitamin D, Ergocalciferol, (DRISDOL) 50000 units CAPS capsule, Take 50,000 Units  by mouth. , Disp: , Rfl:  .  warfarin (COUMADIN) 2 MG tablet, Take 1 tablet (2 mg total) by mouth daily., Disp: 30 tablet, Rfl: 3 .  warfarin (COUMADIN) 2.5 MG tablet, TAKE 1 TABLET BY MOUTH EVERY DAY, Disp: 30 tablet, Rfl: 5 .  warfarin (COUMADIN) 3 MG tablet, Take 1 tablet (3 mg total) by mouth daily., Disp: 30 tablet, Rfl: 12 .  warfarin (COUMADIN) 4 MG tablet, Take 1 tablet (4 mg total) by mouth daily., Disp: 30 tablet, Rfl: 12  Review of Systems  Constitutional: Negative for appetite change, chills, fatigue and fever.  Respiratory: Negative for chest tightness and shortness of breath.   Cardiovascular: Negative for chest pain and palpitations.  Gastrointestinal: Negative for abdominal pain, nausea and vomiting.  Neurological: Negative for dizziness and weakness.    Social History   Tobacco Use  . Smoking status: Never Smoker  . Smokeless tobacco: Never Used  Substance Use Topics  . Alcohol use: No      Objective:   There were no vitals taken for this visit. There were no vitals filed for this visit.There is no height or weight on file to calculate BMI.   Physical Exam   No results found for any visits on 04/10/19.     Assessment & Plan        Wilhemena Durie, MD  [facility]

## 2019-04-10 ENCOUNTER — Ambulatory Visit: Payer: Self-pay | Admitting: Family Medicine

## 2019-04-11 ENCOUNTER — Ambulatory Visit
Admission: RE | Admit: 2019-04-11 | Discharge: 2019-04-11 | Disposition: A | Discharge status: Home / Self Care | Payer: Medicare Other | Source: Ambulatory Visit | Attending: Urology | Admitting: Urology

## 2019-04-11 ENCOUNTER — Other Ambulatory Visit: Payer: Self-pay

## 2019-04-11 DIAGNOSIS — N390 Urinary tract infection, site not specified: Secondary | ICD-10-CM

## 2019-04-11 DIAGNOSIS — N2889 Other specified disorders of kidney and ureter: Secondary | ICD-10-CM | POA: Diagnosis not present

## 2019-04-13 NOTE — Progress Notes (Deleted)
Cardiology Office Note  Date:  04/13/2019   ID:  Tracy, Mann [birth year], MRN [id number]  PCP:  Jerrol Banana., MD   No chief complaint on file.   HPI:  [name] is a 86 year old woman with a PMH significant for  chronic atrial fibrillation on Coumadin therapy,  diastolic dysfunction, EF 40 to 45% on echo 01/2018  hypertension,  hyperlipidemia,  morbid obesity,   moderate pulmonary hypertension inferior wall myocardial infarction felt to be secondary to a thrombus from atrial fib in October 2007 at [facility],   episodes of chest pain  who presents for followup Of her coronary artery disease and leg edema.  In follow-up today she reports that shortness of breath is improving Reports eating less, Tried torsemide for several days but it caused worsening urethral irritation and she went back to Lasix 20 daily Weight down 13 pounds since early 11/20  She is concerned about Migrating arthalgias Wonders if she needs to see rheumatology Was talking to a nurse from the insurance company Leg pain below the knees,  She continues to have severe lower extremity edema  Not very active, concerned that her weight is over 200 pounds No regular exercise, Does not know how to get her strength back  Previous x-ray reviewed with her showing pulmonary edema beginning of November 2020  Unable to wear compression hose  EKG personally reviewed by myself on todays visit Shows atrial fibrillation ventricular rate 90 bpm intraventricular conduction delay  Lab work reviewed with her HBA1C 8.7  Monitor, reviewed in detail today Atrial fibrillation, 100% burden avg of 83 bpm  Other past medical history reviewed In follow-up today, she reports that she had an episode of chest pain 05/21/2015 on the left side She went to the emergency room, was admitted. Hospital records reviewed. Cardiac enzymes were unrevealing, EKG unchanged. She had stress Myoview showing  old inferior MI with no ischemia consistent with her known anatomy. She did not take nitroglycerin for her symptoms.  Previously seen by vein and vascular, Dr. Ronalee Belts, recommended to wear TED hose or ACE wraps It was mentioned that perhaps she could wear lymphedema compression pump.    PMH:   has a past medical history of Acute myocardial infarction of inferior wall ([place]), Arthritis, Chronic anticoagulation, Chronic atrial fibrillation ([place]), Chronic combined systolic and diastolic CHF (congestive heart failure) ([place]), Essential hypertension, Hyperlipidemia, mixed, Mixed Ischemic/Nonischemic Cardiomyopathy, Nonobstructive coronary artery disease, Obesity, Thyroid disease, Type II diabetes mellitus ([place]), Venous insufficiency, and Vitamin D deficiency.  PSH:    Past Surgical History:  Procedure Laterality Date  . CARDIAC CATHETERIZATION  11/2012   [facility];EF 45-50%  . CHOLECYSTECTOMY  1999  . TUBAL LIGATION  1968  . VESICOVAGINAL FISTULA CLOSURE W/ TAH  1996    Current Outpatient Medications  Medication Sig Dispense Refill  . ACCU-CHEK AVIVA PLUS test strip USE WITH METER TO CHECK GLUCOSE BEFORE MEALS AND AT BEDTIME 200 strip 12  . clotrimazole-betamethasone (LOTRISONE) cream Apply 1 application topically 2 (two) times daily. 30 g 0  . Coenzyme Q10 (CO Q 10 PO) Take by mouth.    . COMBIVENT RESPIMAT 20-100 MCG/ACT AERS respimat 1 puff every 6 (six) hours as needed.     . diltiazem (CARDIZEM) 30 MG tablet TAKE 1 TABLET (30 MG TOTAL) BY MOUTH 3 (THREE) TIMES DAILY AS NEEDED. 270 tablet 0  . ezetimibe (ZETIA) 10 MG tablet TAKE 1 TABLET BY MOUTH EVERY DAY 90 tablet 3  .  flavoxATE (URISPAS) 100 MG tablet Take 1 tablet (100 mg total) by mouth 3 (three) times daily as needed for bladder spasms. 30 tablet 0  . fluconazole (DIFLUCAN) 100 MG tablet Take 1 tablet (100 mg total) by mouth daily. 3 tablet 0  . fluticasone (FLONASE) 50 MCG/ACT nasal spray SPRAY TWO SPRAYS IN EACH NOSTRIL ONCE DAILY 48 g  3  . furosemide (LASIX) 20 MG tablet Take 1 tablet (20 mg total) by mouth 2 (two) times daily. 180 tablet 3  . [START ON 05/22/2019] HYDROcodone-acetaminophen (NORCO) 10-325 MG tablet Take 1 tablet by mouth every 6 (six) hours as needed. 120 tablet 0  . [START ON 04/21/2019] HYDROcodone-acetaminophen (NORCO) 10-325 MG tablet Take 1 tablet by mouth every 6 (six) hours as needed. 120 tablet 0  . HYDROcodone-acetaminophen (NORCO) 10-325 MG tablet Take 1 tablet by mouth every 6 (six) hours as needed. 120 tablet 0  . insulin lispro (HUMALOG KWIKPEN) 100 UNIT/ML KwikPen INJECT 10-20 UNITS THREE TIMES DAILY WITH EACH MEAL. 15 mL 12  . Insulin Pen Needle (RELION PEN NEEDLE 31G/8MM) 31G X 8 MM MISC Use with insulin pen three times daily 100 each 12  . levothyroxine (SYNTHROID) 125 MCG tablet TAKE 1 TABLET (125 MCG TOTAL) BY MOUTH DAILY BEFORE BREAKFAST. 90 tablet 4  . lisinopril (ZESTRIL) 2.5 MG tablet TAKE 1 TABLET BY MOUTH EVERYDAY AT BEDTIME 90 tablet 1  . LORazepam (ATIVAN) 1 MG tablet TAKE 1 TABLET BY MOUTH TWICE A DAY AS NEEDED 60 tablet 1  . Magnesium 500 MG CAPS Take 500 mg by mouth daily.     . mirabegron ER (MYRBETRIQ) 50 MG TB24 tablet Take 1 tablet (50 mg total) by mouth daily. 30 tablet 11  . nitrofurantoin, macrocrystal-monohydrate, (MACROBID) 100 MG capsule Take 1 capsule (100 mg total) by mouth daily. 30 capsule 2  . nitroGLYCERIN (NITROSTAT) 0.4 MG SL tablet Place 1 tablet (0.4 mg total) under the tongue every 5 (five) minutes as needed for chest pain. 25 tablet 3  . pantoprazole (PROTONIX) 20 MG tablet Take 1 tablet (20 mg total) by mouth daily. 30 tablet 6  . terconazole (TERAZOL 7) 0.4 % vaginal cream One thin layer twice daily. 45 g 0  . trimethoprim (TRIMPEX) 100 MG tablet Take 1 tablet (100 mg total) by mouth daily. 30 tablet 11  . Vitamin D, Ergocalciferol, (DRISDOL) 50000 units CAPS capsule Take 50,000 Units by mouth.     . warfarin (COUMADIN) 2 MG tablet Take 1 tablet (2 mg total) by  mouth daily. 30 tablet 3  . warfarin (COUMADIN) 2.5 MG tablet TAKE 1 TABLET BY MOUTH EVERY DAY 30 tablet 5  . warfarin (COUMADIN) 3 MG tablet Take 1 tablet (3 mg total) by mouth daily. 30 tablet 12  . warfarin (COUMADIN) 4 MG tablet Take 1 tablet (4 mg total) by mouth daily. 30 tablet 12   No current facility-administered medications for this visit.    Allergies:   Other; Sulfa antibiotics; Clarithromycin; Diphenhydramine; Estrogens conjugated; Estrogens, conjugated; Sulfonamide derivatives; and Nitrofurantoin   Social History:  The patient  reports that she has never smoked. She has never used smokeless tobacco. She reports that she does not drink alcohol or use drugs.   Family History:   family history includes Heart disease in her mother; Thyroid disease in her father.    Review of Systems: Review of Systems  Constitutional: Negative.   HENT: Negative.   Respiratory: Negative.   Cardiovascular: Positive for leg swelling.  Gastrointestinal: Negative.  Musculoskeletal: Positive for joint pain.  Neurological: Negative.   Psychiatric/Behavioral: Negative.   All other systems reviewed and are negative.   PHYSICAL EXAM: VS:  There were no vitals taken for this visit. , BMI There is no height or weight on file to calculate BMI.  Constitutional:  oriented to person, place, and time. No distress.  Obese HENT:  Head: Grossly normal Eyes:  no discharge. No scleral icterus.  Neck: No JVD, no carotid bruits  Cardiovascular:  irregularly irregular no murmurs appreciated 2+ pitting edema below the knees Pulmonary/Chest: Clear to auscultation bilaterally, no wheezes or rails Abdominal: Soft.  no distension.  no tenderness.  Musculoskeletal: Normal range of motion Neurological:  normal muscle tone. Coordination normal. No atrophy Skin: Skin warm and dry Psychiatric: normal affect, pleasant   Recent Labs: 04/30/2018: NT-Pro BNP 531 08/29/2018: ALT 13; BUN 18; Creatinine, Ser 0.78;  Hemoglobin 13.5; Magnesium 2.1; Platelets 225; Potassium 4.9; Sodium 139; TSH 0.888    Lipid Panel Lab Results  Component Value Date   CHOL 172 01/17/2019   HDL 57 01/17/2019   LDLCALC 94 01/17/2019   TRIG 119 01/17/2019      Wt Readings from Last 3 Encounters:  04/03/19 239 lb (108.4 kg)  03/27/19 231 lb 9.6 oz (105.1 kg)  03/24/19 230 lb 12.8 oz (104.7 kg)     ASSESSMENT AND PLAN: Chest pain Denies having any chest pain at this time No further testing  Acute on chronic diastolic CHF Did not tolerate torsemide secondary to bladder/urethral irritation Recommend she stay on Lasix 20 daily, try for 20 twice daily Down 13 pounds from prior clinic visit by taking Lasix consistently And moderating her salt and fluid intake Still with significant leg edema that is likely painful, noted below the knees bilaterally Currently taking pain medications for pain from the swelling Unable to exclude arthritic issue causing her pain Would hope we can improve her pain with continued diuresis  Mixed hyperlipidemia Continue Zetia Statin intolerance  Essential hypertension - Plan: EKG 12-Lead No changes to her blood pressure medication  Atherosclerosis of native coronary artery of native heart without angina pectoris Prior inferior MI Echo showing stable ejection fraction 45%  No further ischemic work-up at this time  Mixed Ischemic/Nonischemic Cardiomyopathy - Plan: EKG 12-Lead On last clinic visit,  not seem particularly interested in lymphedema compression pumps.  Previously seen by Dr. Ronalee Belts -Recommend we continue Lasix 20 daily if not 20 twice daily if tolerated  Chronic atrial fibrillation ([place]) - Plan: EKG 12-Lead Tolerating warfarin, rate well controlled Bradycardia on metoprolol Rate is well controlled  Obstructive sleep apnea Stressed importance of staying on CPAP  Type 2 diabetes mellitus without complication, with long-term current use of insulin ([place]) Managed by  primary care and endocrine HBA1C greater than 8 Reports that she is eating less  PVCs Asymptomatic, seen on EKG today  Pauses Likely secondary to sleep apnea, not wearing her CPAP Has 4-second pauses sparingly 3 in 2 weeks time when sleeping Stressed importance of wearing her CPAP No near syncope   Total encounter time more than 25 minutes  Greater than 50% was spent in counseling and coordination of care with the patient  Follow-up 3 weeks   Total encounter time more than 25 minutes  Greater than 50% was spent in counseling and coordination of care with the patient    No orders of the defined types were placed in this encounter.    Mila Merry, M.D., Ph.D. 04/13/2019  [facility]  [place], [place]

## 2019-04-14 ENCOUNTER — Telehealth: Payer: Self-pay

## 2019-04-14 ENCOUNTER — Telehealth: Payer: Self-pay | Admitting: Urology

## 2019-04-14 DIAGNOSIS — R300 Dysuria: Secondary | ICD-10-CM

## 2019-04-14 DIAGNOSIS — N2889 Other specified disorders of kidney and ureter: Secondary | ICD-10-CM

## 2019-04-14 MED ORDER — NITROFURANTOIN MACROCRYSTAL 100 MG PO CAPS: 100.0000 mg | ORAL_CAPSULE | Freq: Every day | ORAL | 11 refills | Status: DC

## 2019-04-14 MED ORDER — FLAVOXATE HCL 100 MG PO TABS: 100.0000 mg | ORAL_TABLET | Freq: Two times a day (BID) | ORAL | 11 refills | Status: DC | PRN

## 2019-04-14 NOTE — Addendum Note (Signed)
Addended by: Tommy Rainwater on: 04/14/2019 10:09 AM   Modules accepted: Orders

## 2019-04-14 NOTE — Telephone Encounter (Signed)
Patient returned call and Dr. Matilde Sprang reviewed results with her

## 2019-04-14 NOTE — Telephone Encounter (Signed)
Left patient a message to call and speak with Dr. Matilde Sprang for RUS results

## 2019-04-14 NOTE — Telephone Encounter (Signed)
Orders placed and appointment made for follow up with Dr. Erlene Quan Patient notified

## 2019-04-14 NOTE — Telephone Encounter (Signed)
I spoke to the patient.  Were going to get a CT scan of the abdomen with and without contrast and hopefully see Dr. Erlene Quan in the next 2 to 3 weeks for a left renal mass.  At the patient's request she wanted me to call in flavoxate 100 mg p.o. twice a day as necessary 60 tablets with 11 refills.  She want to go back on daily Macrodantin 100 mg 30x11.  She had side effects with trimethoprim and a headache from the [facility]

## 2019-04-14 NOTE — Addendum Note (Signed)
Addended by: Tommy Rainwater on: 04/14/2019 09:35 AM   Modules accepted: Orders

## 2019-04-15 ENCOUNTER — Ambulatory Visit: Payer: Medicare Other | Admitting: Cardiovascular Disease

## 2019-04-15 ENCOUNTER — Ambulatory Visit: Payer: Self-pay

## 2019-04-16 ENCOUNTER — Other Ambulatory Visit: Payer: Self-pay | Admitting: Family Medicine

## 2019-04-16 NOTE — Telephone Encounter (Signed)
Requested medication (s) are due for refill today: Yes  Requested medication (s) are on the active medication list: Yes  Last refill:  12/19/17  Future visit scheduled: Yes  Notes to clinic:  Prescription has expired.    Requested Prescriptions  Pending Prescriptions Disp Refills   warfarin (COUMADIN) 3 MG tablet [Pharmacy Med Name: WARFARIN SODIUM 3 MG TABLET] 90 tablet 4    Sig: TAKE 1 TABLET BY MOUTH DAILY      Hematology:  Anticoagulants - warfarin Failed - 04/16/2019 12:37 PM      Failed - This refill cannot be delegated      Failed - If the patient is managed by Coumadin Clinic - route to their Pool. If not, forward to the provider.      Failed - INR in normal range and within 30 days    INR  Date Value Ref Range Status  04/08/2019 5.0 (A) 2.0 - 3.0 Final  12/15/2016 1.8 (H)  Final    Comment:    Reference Range                     0.9-1.1 Moderate-intensity Warfarin Therapy 2.0-3.0 Higher-intensity Warfarin Therapy   3.0-4.0  .   01/26/2014 3.5  Final    Comment:    INR reference interval applies to patients on anticoagulant therapy. A single INR therapeutic range for coumarins is not optimal for all indications; however, the suggested range for most indications is 2.0 - 3.0. Exceptions to the INR Reference Range may include: Prosthetic heart valves, acute myocardial infarction, prevention of myocardial infarction, and combinations of aspirin and anticoagulant. The need for a higher or lower target INR must be assessed individually. Reference: The Pharmacology and Management of the Vitamin K  antagonists: the seventh ACCP Conference on Antithrombotic and Thrombolytic Therapy. [id number] Sept:126 (3suppl): X2190819. A HCT value >55% may artifactually increase the PT.  In one study,  the increase was an average of 25%. Reference:  "Effect on Routine and Special Coagulation Testing Values of Citrate Anticoagulant Adjustment in Patients with High HCT  Values." American Journal of Clinical Pathology 2006;126:400-405.           Passed - Valid encounter within last 3 months    Recent Outpatient Visits           1 week ago Yeast infection   [facility] Jerrol Banana., MD   2 weeks ago Candidal skin infection   [place], [place], [place]   3 weeks ago Dysuria   [facility] Jerrol Banana., MD   2 months ago [place], Clearnce Sorrel, [place]   3 months ago [place], Vickki Muff, [place]       Future Appointments             In 1 week [name], Kathlene November, MD [facility], [facility]   In 3 weeks Hollice Espy, MD [place]   In 1 month Brendolyn Patty, MD [place]   In 2 months Jerrol Banana., MD [facility], [place]

## 2019-04-21 ENCOUNTER — Other Ambulatory Visit: Payer: Self-pay | Admitting: Family Medicine

## 2019-04-21 ENCOUNTER — Ambulatory Visit: Payer: Medicare Other | Admitting: Urology

## 2019-04-21 DIAGNOSIS — J309 Allergic rhinitis, unspecified: Secondary | ICD-10-CM

## 2019-04-22 ENCOUNTER — Other Ambulatory Visit: Payer: Self-pay

## 2019-04-22 ENCOUNTER — Ambulatory Visit (INDEPENDENT_AMBULATORY_CARE_PROVIDER_SITE_OTHER): Payer: Medicare Other

## 2019-04-22 DIAGNOSIS — I482 Chronic atrial fibrillation, unspecified: Secondary | ICD-10-CM

## 2019-04-22 LAB — POCT INR
INR: 3.9 — AB (ref 2.0–3.0)
PT: 47.3

## 2019-04-22 NOTE — Patient Instructions (Signed)
Description   Hold for 2days then take 3mg  daily. F/U 1 weeks

## 2019-04-27 NOTE — Progress Notes (Signed)
Cardiology Office Note  Date:  04/28/2019   ID:  Tracy Mann, Tracy Mann March 09, 1933, MRN [id number]  PCP:  Jerrol Banana., MD   Chief Complaint  Patient presents with  . office visit    3 month F/U; Meds verbally reviewed with patient.    HPI:  Tracy Mann is a 86 year old woman with a PMH significant for  chronic atrial fibrillation on Coumadin therapy,  diastolic dysfunction, EF 40 to 45% on echo 01/2018  hypertension,  hyperlipidemia,  morbid obesity,   moderate pulmonary hypertension inferior wall myocardial infarction felt to be secondary to a thrombus from atrial fib in October 2007 at [facility],   episodes of chest pain  who presents for followup Of her coronary artery disease and leg edema.  On today's visit she continues to have worsening leg swelling, weight gain Weight was 227 on last clinic visit several months ago after aggressive diuresis, now up to 235 today Unable to wear compression hose  On lasix 20 daily Lasix 40 causes urethral irritation Same problem with torsemide.  Urethral irritation  Has follow up urology Take pyridium daily, sometimes several times a day  HBA1C 9.1 on labs 12/2018 On insulin Previously seen by Dr. Eddie Dibbles Previously on "night" insulin and numbers were much better  Migrating arthralgias lower extremity edema, severe, difficulty walking his legs are tight  Previously seen by vein and vascular, Dr. Ronalee Belts, recommended to wear TED hose or ACE wraps It was mentioned that perhaps she could wear lymphedema compression pump.    EKG personally reviewed by myself on todays visit Shows atrial fibrillation ventricular rate 86 bpm intraventricular conduction delay, no change from previous EKG  Other past medical history reviewed In follow-up today, she reports that she had an episode of chest pain 05/21/2015 on the left side She went to the emergency room, was admitted. Hospital records reviewed. Cardiac enzymes were  unrevealing, EKG unchanged. She had stress Myoview showing old inferior MI with no ischemia consistent with her known anatomy. She did not take nitroglycerin for her symptoms.   PMH:   has a past medical history of Acute myocardial infarction of inferior wall ([place]), Arthritis, Chronic anticoagulation, Chronic atrial fibrillation ([place]), Chronic combined systolic and diastolic CHF (congestive heart failure) ([place]), Essential hypertension, Hyperlipidemia, mixed, Mixed Ischemic/Nonischemic Cardiomyopathy, Nonobstructive coronary artery disease, Obesity, Thyroid disease, Type II diabetes mellitus ([place]), Venous insufficiency, and Vitamin D deficiency.  PSH:    Past Surgical History:  Procedure Laterality Date  . CARDIAC CATHETERIZATION  11/2012   [facility];EF 45-50%  . CHOLECYSTECTOMY  1999  . TUBAL LIGATION  1968  . VESICOVAGINAL FISTULA CLOSURE W/ TAH  1996    Current Outpatient Medications  Medication Sig Dispense Refill  . ACCU-CHEK AVIVA PLUS test strip USE WITH METER TO CHECK GLUCOSE BEFORE MEALS AND AT BEDTIME 200 strip 12  . clotrimazole-betamethasone (LOTRISONE) cream Apply 1 application topically 2 (two) times daily. 30 g 0  . Coenzyme Q10 (CO Q 10 PO) Take by mouth.    . COMBIVENT RESPIMAT 20-100 MCG/ACT AERS respimat 1 puff every 6 (six) hours as needed.     . diltiazem (CARDIZEM) 30 MG tablet TAKE 1 TABLET (30 MG TOTAL) BY MOUTH 3 (THREE) TIMES DAILY AS NEEDED. 270 tablet 0  . ezetimibe (ZETIA) 10 MG tablet TAKE 1 TABLET BY MOUTH EVERY DAY 90 tablet 3  . flavoxATE (URISPAS) 100 MG tablet Take 1 tablet (100 mg total) by mouth 2 (two) times daily as needed  for bladder spasms. 60 tablet 11  . fluticasone (FLONASE) 50 MCG/ACT nasal spray SPRAY TWO SPRAYS IN EACH NOSTRIL ONCE DAILY 48 mL 3  . furosemide (LASIX) 20 MG tablet Take 1 tablet (20 mg total) by mouth 2 (two) times daily. 180 tablet 3  . [START ON 05/22/2019] HYDROcodone-acetaminophen (NORCO) 10-325 MG tablet Take 1 tablet by mouth  every 6 (six) hours as needed. 120 tablet 0  . HYDROcodone-acetaminophen (NORCO) 10-325 MG tablet Take 1 tablet by mouth every 6 (six) hours as needed. 120 tablet 0  . insulin lispro (HUMALOG KWIKPEN) 100 UNIT/ML KwikPen INJECT 10-20 UNITS THREE TIMES DAILY WITH EACH MEAL. 15 mL 12  . Insulin Pen Needle (RELION PEN NEEDLE 31G/8MM) 31G X 8 MM MISC Use with insulin pen three times daily 100 each 12  . levothyroxine (SYNTHROID) 125 MCG tablet TAKE 1 TABLET (125 MCG TOTAL) BY MOUTH DAILY BEFORE BREAKFAST. 90 tablet 4  . lisinopril (ZESTRIL) 2.5 MG tablet TAKE 1 TABLET BY MOUTH EVERYDAY AT BEDTIME 90 tablet 1  . LORazepam (ATIVAN) 1 MG tablet TAKE 1 TABLET BY MOUTH TWICE A DAY AS NEEDED 60 tablet 1  . Magnesium 500 MG CAPS Take 500 mg by mouth daily.     . mirabegron ER (MYRBETRIQ) 50 MG TB24 tablet Take 1 tablet (50 mg total) by mouth daily. 30 tablet 11  . nitrofurantoin (MACRODANTIN) 100 MG capsule Take 1 capsule (100 mg total) by mouth daily. 30 capsule 11  . nitroGLYCERIN (NITROSTAT) 0.4 MG SL tablet Place 1 tablet (0.4 mg total) under the tongue every 5 (five) minutes as needed for chest pain. 25 tablet 3  . pantoprazole (PROTONIX) 20 MG tablet Take 1 tablet (20 mg total) by mouth daily. 30 tablet 6  . terconazole (TERAZOL 7) 0.4 % vaginal cream One thin layer twice daily. 45 g 0  . warfarin (COUMADIN) 3 MG tablet TAKE 1 TABLET BY MOUTH DAILY 90 tablet 4  . fluconazole (DIFLUCAN) 100 MG tablet Take 1 tablet (100 mg total) by mouth daily. 3 tablet 0  . HYDROcodone-acetaminophen (NORCO) 10-325 MG tablet Take 1 tablet by mouth every 6 (six) hours as needed. 120 tablet 0  . nitrofurantoin, macrocrystal-monohydrate, (MACROBID) 100 MG capsule Take 1 capsule (100 mg total) by mouth daily. 30 capsule 2  . trimethoprim (TRIMPEX) 100 MG tablet Take 1 tablet (100 mg total) by mouth daily. 30 tablet 11  . Vitamin D, Ergocalciferol, (DRISDOL) 50000 units CAPS capsule Take 50,000 Units by mouth. Going to  restart    . warfarin (COUMADIN) 2 MG tablet Take 1 tablet (2 mg total) by mouth daily. 30 tablet 3  . warfarin (COUMADIN) 2.5 MG tablet TAKE 1 TABLET BY MOUTH EVERY DAY 30 tablet 5  . warfarin (COUMADIN) 4 MG tablet Take 1 tablet (4 mg total) by mouth daily. 30 tablet 12   No current facility-administered medications for this visit.    Allergies:   Other; Sulfa antibiotics; Cephalexin; Clarithromycin; Diphenhydramine; Estrogens conjugated; Estrogens, conjugated; Sulfonamide derivatives; and Nitrofurantoin   Social History:  The patient  reports that she has never smoked. She has never used smokeless tobacco. She reports that she does not drink alcohol or use drugs.   Family History:   family history includes Heart disease in her mother; Thyroid disease in her father.    Review of Systems: Review of Systems  Constitutional: Negative.   HENT: Negative.   Respiratory: Negative.   Cardiovascular: Positive for leg swelling.  Gastrointestinal: Negative.   Musculoskeletal:  Positive for joint pain.  Neurological: Negative.   Psychiatric/Behavioral: Negative.   All other systems reviewed and are negative.   PHYSICAL EXAM: VS:  BP 114/80 (BP Location: Left Arm, Patient Position: Sitting, Cuff Size: Large)   Pulse 86   Ht 5\' 5"  (1.651 m)   Wt 235 lb 2 oz (106.7 kg)   SpO2 94%   BMI 39.13 kg/m  , BMI Body mass index is 39.13 kg/m.  Constitutional:  oriented to person, place, and time. No distress.  HENT:  Head: Grossly normal Eyes:  no discharge. No scleral icterus.  Neck: No JVD, no carotid bruits  Cardiovascular: Regular rate and rhythm, no murmurs appreciated Tight edematous legs bilaterally through the thighs Pulmonary/Chest: Clear to auscultation bilaterally, no wheezes or rails Abdominal: Soft.  no distension.  no tenderness.  Musculoskeletal: Normal range of motion Neurological:  normal muscle tone. Coordination normal. No atrophy Skin: Skin warm and dry Psychiatric:  normal affect, pleasant  Recent Labs: 04/30/2018: NT-Pro BNP 531 08/29/2018: ALT 13; BUN 18; Creatinine, Ser 0.78; Hemoglobin 13.5; Magnesium 2.1; Platelets 225; Potassium 4.9; Sodium 139; TSH 0.888    Lipid Panel Lab Results  Component Value Date   CHOL 172 01/17/2019   HDL 57 01/17/2019   LDLCALC 94 01/17/2019   TRIG 119 01/17/2019      Wt Readings from Last 3 Encounters:  04/28/19 235 lb 2 oz (106.7 kg)  04/03/19 239 lb (108.4 kg)  03/27/19 231 lb 9.6 oz (105.1 kg)     ASSESSMENT AND PLAN: Chest pain Denies having any chest pain at this time No further testing  Acute on chronic diastolic CHF Diuretics limited by chronic bladder/urethral irritation She is scheduled to see urology, takes Pyridium daily Difficulty sometimes taking more than Lasix 20 daily Stressed importance of taking more Lasix and decreasing her fluid intake given the extent of her weight gain and leg swelling Previously did not tolerate torsemide for same issue, urethral irritation -Stressed importance that she work on her sugars which are running high, referral to endocrine made Perhaps this is contributing to some of her urethral irritation  Mixed hyperlipidemia Continue Zetia Statin intolerance, no changes made  Essential hypertension - Plan: EKG 12-Lead Blood pressure is well controlled on today's visit. No changes made to the medications.  Atherosclerosis of native coronary artery of native heart without angina pectoris Prior inferior MI Echo showing stable ejection fraction 45%  No further ischemic work-up at this time  Mixed Ischemic/Nonischemic Cardiomyopathy - Plan: EKG 12-Lead Referral back to Dr. Ronalee Belts for consideration of lymphedema compression pumps She is unable to wear compression hose  Chronic atrial fibrillation ([place]) - Plan: EKG 12-Lead Tolerating warfarin, rate well controlled Rate well controlled  Obstructive sleep apnea Stressed importance of staying on CPAP  Type 2  diabetes mellitus without complication, with long-term current use of insulin (HCC) A1c poorly controlled, referral made to endocrine Likely contributing to her issues detailed above  PVCs Asymptomatic, seen on EKG today  Pauses Likely secondary to sleep apnea, not wearing her CPAP Has 4-second pauses sparingly 3 in 2 weeks time when sleeping Stressed importance of wearing her CPAP No near syncope   Total encounter time more than 25 minutes  Greater than 50% was spent in counseling and coordination of care with the patient  Follow-up 3 months   Total encounter time more than 25 minutes  Greater than 50% was spent in counseling and coordination of care with the patient    No orders of  the defined types were placed in this encounter.    Signed, Esmond Plants, M.D., Ph.D. 04/28/2019  [place], [place]

## 2019-04-28 ENCOUNTER — Other Ambulatory Visit: Payer: Self-pay

## 2019-04-28 ENCOUNTER — Encounter: Payer: Self-pay | Admitting: Cardiovascular Disease

## 2019-04-28 ENCOUNTER — Ambulatory Visit (INDEPENDENT_AMBULATORY_CARE_PROVIDER_SITE_OTHER): Payer: Medicare Other | Admitting: Cardiovascular Disease

## 2019-04-28 VITALS — BP 114/80 | HR 86 | Ht 65.0 in | Wt 235.1 lb

## 2019-04-28 DIAGNOSIS — R0600 Dyspnea, unspecified: Secondary | ICD-10-CM

## 2019-04-28 DIAGNOSIS — E1159 Type 2 diabetes mellitus with other circulatory complications: Secondary | ICD-10-CM

## 2019-04-28 DIAGNOSIS — I482 Chronic atrial fibrillation, unspecified: Secondary | ICD-10-CM

## 2019-04-28 DIAGNOSIS — I25118 Atherosclerotic heart disease of native coronary artery with other forms of angina pectoris: Secondary | ICD-10-CM | POA: Diagnosis not present

## 2019-04-28 DIAGNOSIS — I5042 Chronic combined systolic (congestive) and diastolic (congestive) heart failure: Secondary | ICD-10-CM

## 2019-04-28 DIAGNOSIS — R0609 Other forms of dyspnea: Secondary | ICD-10-CM

## 2019-04-28 DIAGNOSIS — R5382 Chronic fatigue, unspecified: Secondary | ICD-10-CM

## 2019-04-28 DIAGNOSIS — Z794 Long term (current) use of insulin: Secondary | ICD-10-CM

## 2019-04-28 DIAGNOSIS — E782 Mixed hyperlipidemia: Secondary | ICD-10-CM

## 2019-04-28 DIAGNOSIS — M7989 Other specified soft tissue disorders: Secondary | ICD-10-CM

## 2019-04-28 NOTE — Patient Instructions (Addendum)
Referral to endocrine placed for [facility] with Dr. Gabriel Carina. I will send office visit note to their office as well as call over to check on this.   Stop in at [facility], Discuss full leg length Velcro  Compression for leg swelling.  If unable to get these for some reason then schedule appointment with [facility] to revisit lymphedema boots.    Medication Instructions:  Please try lasix 2 of the 20 mg (40mg ) If not enough urination, We might need to change back to torsemide 20 up to 40 mg pill   If you need a refill on your cardiac medications before your next appointment, please call your pharmacy.    Lab work: No new labs needed   If you have labs (blood work) drawn today and your tests are completely normal, you will receive your results only by: Marland Kitchen MyChart Message (if you have MyChart) OR . A paper copy in the mail If you have any lab test that is abnormal or we need to change your treatment, we will call you to review the results.   Testing/Procedures: No new testing needed   Follow-Up: At [facility], you and your health needs are our priority.  As part of our continuing mission to provide you with exceptional heart care, we have created designated Provider Care Teams.  These Care Teams include your primary Cardiologist (physician) and Advanced Practice Providers (APPs -  Physician Assistants and Nurse Practitioners) who all work together to provide you with the care you need, when you need it.  . You will need a follow up appointment in 3 months   . Providers on your designated Care Team:   . Murray Hodgkins, NP . Christell Faith, PA-C . Marrianne Mood, PA-C  Any Other Special Instructions Will Be Listed Below (If Applicable).  COVID-19 Vaccine Information can be found at: [email or web address].uk For questions related to vaccine distribution or appointments, please email  [email or web address] or call [phone].

## 2019-04-29 ENCOUNTER — Ambulatory Visit (INDEPENDENT_AMBULATORY_CARE_PROVIDER_SITE_OTHER): Payer: Medicare Other

## 2019-04-29 DIAGNOSIS — I482 Chronic atrial fibrillation, unspecified: Secondary | ICD-10-CM | POA: Diagnosis not present

## 2019-04-29 LAB — POCT INR
INR: 2.6 (ref 2.0–3.0)
PT: 31.5

## 2019-04-29 NOTE — Patient Instructions (Signed)
Description   Take 3mg  daily. F/U 3 weeks

## 2019-04-30 ENCOUNTER — Other Ambulatory Visit: Payer: Self-pay | Admitting: *Deleted

## 2019-04-30 DIAGNOSIS — E1159 Type 2 diabetes mellitus with other circulatory complications: Secondary | ICD-10-CM

## 2019-04-30 NOTE — Addendum Note (Signed)
Addended by: Anselm Pancoast on: 04/30/2019 11:28 AM   Modules accepted: Orders

## 2019-05-02 ENCOUNTER — Other Ambulatory Visit: Payer: Self-pay

## 2019-05-02 ENCOUNTER — Encounter: Payer: Self-pay | Admitting: Family Medicine

## 2019-05-02 ENCOUNTER — Other Ambulatory Visit: Payer: Self-pay | Admitting: Family Medicine

## 2019-05-02 ENCOUNTER — Ambulatory Visit (INDEPENDENT_AMBULATORY_CARE_PROVIDER_SITE_OTHER): Payer: Medicare Other | Admitting: Family Medicine

## 2019-05-02 VITALS — BP 144/84 | HR 89 | Temp 96.8°F | Ht 65.0 in | Wt 238.2 lb

## 2019-05-02 DIAGNOSIS — N3020 Other chronic cystitis without hematuria: Secondary | ICD-10-CM | POA: Diagnosis not present

## 2019-05-02 DIAGNOSIS — R300 Dysuria: Secondary | ICD-10-CM

## 2019-05-02 DIAGNOSIS — I5032 Chronic diastolic (congestive) heart failure: Secondary | ICD-10-CM

## 2019-05-02 LAB — POCT URINALYSIS DIPSTICK
Glucose, UA: NEGATIVE
Protein, UA: POSITIVE — AB
Spec Grav, UA: >=1.03 — AB (ref 1.010–1.025)
Urobilinogen, UA: NEGATIVE E.U./dL — AB
pH, UA: 5 (ref 5.0–8.0)

## 2019-05-02 MED ORDER — LEVOFLOXACIN 500 MG PO TABS: 500.0000 mg | ORAL_TABLET | Freq: Every day | ORAL | 0 refills | Status: DC

## 2019-05-02 MED ORDER — PHENAZOPYRIDINE HCL 200 MG PO TABS: 200.0000 mg | ORAL_TABLET | Freq: Three times a day (TID) | ORAL | 0 refills | Status: DC | PRN

## 2019-05-02 NOTE — Telephone Encounter (Signed)
Requested medication (s) are due for refill today: no  Requested medication (s) are on the active medication list: no  Last refill:  not on current med list  Future visit scheduled: yes  Notes to clinic:  Refill not on current med list    Requested Prescriptions  Pending Prescriptions Disp Refills   estradiol (ESTRACE) 0.1 MG/GM vaginal cream [Pharmacy Med Name: ESTRADIOL 0.01% CREAM] 42.5 g 0    Sig: Place 1 Applicatorful vaginally at bedtime.      OB/GYN:  Estrogens Failed - 05/02/2019  6:09 PM      Failed - Mammogram is up-to-date per Health Maintenance      Failed - Last BP in normal range    BP Readings from Last 1 Encounters:  05/02/19 (!) 144/84          Passed - Valid encounter within last 12 months    Recent Outpatient Visits           Today [place], [place], [place]   4 weeks ago Yeast infection   [facility] Jerrol Banana., MD   1 month ago Candidal skin infection   [place], [place], [place]   1 month ago [place] Jerrol Banana., MD   2 months ago [place], Clearnce [place], [place]       Future Appointments             In 5 days Hollice Espy, MD [place]   In 2 weeks Brendolyn Patty, MD [place]   In 1 month Jerrol Banana., MD [facility], [place]   In 2 months [name], Kathlene November, MD [facility], [place]

## 2019-05-02 NOTE — Progress Notes (Signed)
Patient: Tracy Mann Female    DOB: 09/24/1933   86 y.o.   MRN: [id number] Visit Date: 05/02/2019  Today's Provider: Vernie Murders, PA   Chief Complaint  Patient presents with  . Dysuria  . Shortness of Breath   Subjective:     Dysuria  This is a recurrent problem. The current episode started 1 to 4 weeks ago. The quality of the pain is described as burning and aching. The pain is at a severity of 8/10. There has been no fever. Associated symptoms include chills, flank pain, hesitancy and nausea. Pertinent negatives include no discharge, frequency, hematuria or vomiting. She has tried antibiotics for the symptoms. The treatment provided mild relief.   Patient also experiencing SOB when walking.   Past Medical History:  Diagnosis Date  . Acute myocardial infarction of inferior wall ([place])    a. 10/2005 - Presumed to be 2/2 to a coronary embolus in setting of persistent Afib-->Cath 2014 relatively nl cors, EF 45-50% w/ severe distal inferior/apical inferior HK w/ aneursymal appearance.  . Arthritis   . Chronic anticoagulation    a. warfarin.  . Chronic atrial fibrillation (HCC)    a. CHA2DS2VASc = 6--> warfarin.  . Chronic combined systolic and diastolic CHF (congestive heart failure) ([place])    a. 10/2012 Echo: EF 35-40%, diff HK, mild MR, mild biatrial enlargement;  b. 11/2012 EF 45-50% by LV gram; c. echo 07/2014: EF 40-45%, mod conc LVH, mod MR, severe biatrial enlargement, PASP 45 mm Hg c. Echo 01/2018 EF 45-50%, duffuse hypokinesis, mild MR, LA mod dilated, norm RVSF, dilated IVC  . Essential hypertension   . Hyperlipidemia, mixed   . Mixed Ischemic/Nonischemic Cardiomyopathy    a. 10/2012 Echo: EF 35-40%;  b. 11/2012 EF 45-50% by LV gram.  . Nonobstructive coronary artery disease    a. 2007 inferior wall MI thought to be secondary to thrombus from atrial fib b. 2014 cath with R dominant coronary arterial system with mild luminal irregularities c. Myoview 05/2015 old  inferior MI, no new ischemic changes  . Obesity   . Thyroid disease    hypothyroidism  . Type II diabetes mellitus ([place])    a. 07/2018 A1c 8.7 - long term insulin use  . Venous insufficiency   . Vitamin D deficiency    Past Surgical History:  Procedure Laterality Date  . CARDIAC CATHETERIZATION  11/2012   [facility];EF 45-50%  . CHOLECYSTECTOMY  1999  . TUBAL LIGATION  1968  . VESICOVAGINAL FISTULA CLOSURE W/ TAH  1996   Family History  Problem Relation Age of Onset  . Heart disease Mother   . Thyroid disease Father    Allergies  Allergen Reactions  . Other Anaphylaxis and Swelling    'tongue swelling'  . Sulfa Antibiotics Anaphylaxis  . Cephalexin     Blisters  . Clarithromycin Other (See Comments)    Hives, headaches, hard time swelling, felt like throat was closing up Hives, headaches, hard time swelling, felt like throat was closing up  . Diphenhydramine Other (See Comments)  . Estrogens Conjugated   . Estrogens, Conjugated Other (See Comments)  . Sulfonamide Derivatives Swelling    'tongue swelling'  . Nitrofurantoin Nausea Only    Current Outpatient Medications:  .  ACCU-CHEK AVIVA PLUS test strip, USE WITH METER TO CHECK GLUCOSE BEFORE MEALS AND AT BEDTIME, Disp: 200 strip, Rfl: 12 .  clotrimazole-betamethasone (LOTRISONE) cream, Apply 1 application topically 2 (two) times daily., Disp: 30 g,  Rfl: 0 .  Coenzyme Q10 (CO Q 10 PO), Take by mouth., Disp: , Rfl:  .  COMBIVENT RESPIMAT 20-100 MCG/ACT AERS respimat, 1 puff every 6 (six) hours as needed. , Disp: , Rfl:  .  diltiazem (CARDIZEM) 30 MG tablet, TAKE 1 TABLET (30 MG TOTAL) BY MOUTH 3 (THREE) TIMES DAILY AS NEEDED., Disp: 270 tablet, Rfl: 0 .  ezetimibe (ZETIA) 10 MG tablet, TAKE 1 TABLET BY MOUTH EVERY DAY, Disp: 90 tablet, Rfl: 3 .  flavoxATE (URISPAS) 100 MG tablet, Take 1 tablet (100 mg total) by mouth 2 (two) times daily as needed for bladder spasms., Disp: 60 tablet, Rfl: 11 .  fluconazole (DIFLUCAN) 100 MG  tablet, Take 1 tablet (100 mg total) by mouth daily., Disp: 3 tablet, Rfl: 0 .  fluticasone (FLONASE) 50 MCG/ACT nasal spray, SPRAY TWO SPRAYS IN EACH NOSTRIL ONCE DAILY, Disp: 48 mL, Rfl: 3 .  furosemide (LASIX) 20 MG tablet, Take 1 tablet (20 mg total) by mouth 2 (two) times daily., Disp: 180 tablet, Rfl: 3 .  [START ON 05/22/2019] HYDROcodone-acetaminophen (NORCO) 10-325 MG tablet, Take 1 tablet by mouth every 6 (six) hours as needed., Disp: 120 tablet, Rfl: 0 .  HYDROcodone-acetaminophen (NORCO) 10-325 MG tablet, Take 1 tablet by mouth every 6 (six) hours as needed., Disp: 120 tablet, Rfl: 0 .  HYDROcodone-acetaminophen (NORCO) 10-325 MG tablet, Take 1 tablet by mouth every 6 (six) hours as needed., Disp: 120 tablet, Rfl: 0 .  insulin lispro (HUMALOG KWIKPEN) 100 UNIT/ML KwikPen, INJECT 10-20 UNITS THREE TIMES DAILY WITH EACH MEAL., Disp: 15 mL, Rfl: 12 .  Insulin Pen Needle (RELION PEN NEEDLE 31G/8MM) 31G X 8 MM MISC, Use with insulin pen three times daily, Disp: 100 each, Rfl: 12 .  levothyroxine (SYNTHROID) 125 MCG tablet, TAKE 1 TABLET (125 MCG TOTAL) BY MOUTH DAILY BEFORE BREAKFAST., Disp: 90 tablet, Rfl: 4 .  lisinopril (ZESTRIL) 2.5 MG tablet, TAKE 1 TABLET BY MOUTH EVERYDAY AT BEDTIME, Disp: 90 tablet, Rfl: 1 .  LORazepam (ATIVAN) 1 MG tablet, TAKE 1 TABLET BY MOUTH TWICE A DAY AS NEEDED, Disp: 60 tablet, Rfl: 1 .  Magnesium 500 MG CAPS, Take 500 mg by mouth daily. , Disp: , Rfl:  .  mirabegron ER (MYRBETRIQ) 50 MG TB24 tablet, Take 1 tablet (50 mg total) by mouth daily., Disp: 30 tablet, Rfl: 11 .  nitrofurantoin (MACRODANTIN) 100 MG capsule, Take 1 capsule (100 mg total) by mouth daily., Disp: 30 capsule, Rfl: 11 .  pantoprazole (PROTONIX) 20 MG tablet, Take 1 tablet (20 mg total) by mouth daily., Disp: 30 tablet, Rfl: 6 .  terconazole (TERAZOL 7) 0.4 % vaginal cream, One thin layer twice daily., Disp: 45 g, Rfl: 0 .  trimethoprim (TRIMPEX) 100 MG tablet, Take 1 tablet (100 mg total) by  mouth daily., Disp: 30 tablet, Rfl: 11 .  Vitamin D, Ergocalciferol, (DRISDOL) 50000 units CAPS capsule, Take 50,000 Units by mouth. Going to restart, Disp: , Rfl:  .  warfarin (COUMADIN) 2 MG tablet, Take 1 tablet (2 mg total) by mouth daily., Disp: 30 tablet, Rfl: 3 .  warfarin (COUMADIN) 2.5 MG tablet, TAKE 1 TABLET BY MOUTH EVERY DAY, Disp: 30 tablet, Rfl: 5 .  warfarin (COUMADIN) 3 MG tablet, TAKE 1 TABLET BY MOUTH DAILY, Disp: 90 tablet, Rfl: 4 .  warfarin (COUMADIN) 4 MG tablet, Take 1 tablet (4 mg total) by mouth daily., Disp: 30 tablet, Rfl: 12 .  nitrofurantoin, macrocrystal-monohydrate, (MACROBID) 100 MG capsule, Take 1 capsule (  100 mg total) by mouth daily. (Patient not taking: Reported on 05/02/2019), Disp: 30 capsule, Rfl: 2 .  nitroGLYCERIN (NITROSTAT) 0.4 MG SL tablet, Place 1 tablet (0.4 mg total) under the tongue every 5 (five) minutes as needed for chest pain., Disp: 25 tablet, Rfl: 3  Review of Systems  Constitutional: Positive for chills.  Gastrointestinal: Positive for nausea. Negative for vomiting.  Genitourinary: Positive for dysuria, flank pain and hesitancy. Negative for frequency and hematuria.    Social History   Tobacco Use  . Smoking status: Never Smoker  . Smokeless tobacco: Never Used  Substance Use Topics  . Alcohol use: No      Objective:   BP (!) 144/84 (BP Location: Right Arm, Patient Position: Sitting, Cuff Size: Large)   Pulse 89   Temp (!) 96.8 F (36 C) (Temporal)   Ht 5\' 5"  (1.651 m)   Wt 238 lb 3.2 oz (108 kg)   SpO2 91%   BMI 39.64 kg/m  Vitals:   05/02/19 1620  BP: (!) 144/84  Pulse: 89  Temp: (!) 96.8 F (36 C)  TempSrc: Temporal  SpO2: 91%  Weight: 238 lb 3.2 oz (108 kg)  Height: 5\' 5"  (1.651 m)  Body mass index is 39.64 kg/m. Wt Readings from Last 3 Encounters:  05/02/19 238 lb 3.2 oz (108 kg)  04/28/19 235 lb 2 oz (106.7 kg)  04/03/19 239 lb (108.4 kg)   Physical Exam Constitutional:      General: She is not in acute  distress.    Appearance: She is well-developed.  HENT:     Head: Normocephalic and atraumatic.     Right Ear: Hearing normal.     Left Ear: Hearing normal.     Nose: Nose normal.  Eyes:     General: Lids are normal. No scleral icterus.       Right eye: No discharge.        Left eye: No discharge.     Conjunctiva/sclera: Conjunctivae normal.  Cardiovascular:     Rate and Rhythm: Normal rate.  Pulmonary:     Effort: Pulmonary effort is normal. No respiratory distress.     Breath sounds: Wheezing present.     Comments: Slight wheezing throughout with few rales. Abdominal:     General: Bowel sounds are normal.     Palpations: Abdomen is soft.     Tenderness: There is abdominal tenderness.     Comments: Suprapubic tenderness to percussion.  Musculoskeletal:        General: Normal range of motion.     Right lower leg: Edema present.     Left lower leg: Edema present.     Comments: Tense skin of both lower legs without pitting.  Skin:    Findings: No lesion or rash.  Neurological:     Mental Status: She is alert and oriented to person, place, and time.  Psychiatric:        Speech: Speech normal.        Behavior: Behavior normal.        Thought Content: Thought content normal.       Assessment & Plan    1. Dysuria Worsening of discomfort when urinating. Urinalysis showed TNTC WBC's and bacteria per hpf. Tried 2 tablets of Pyridium 100 mg this morning with slight relief, but did not take any more. States she had very good response for Levaquin in the past within 3 days. Allergic to sulfa drugs and very little help from [place] recently. Switch  to the Levaquin and Pyridium 200 mg TID regularly to help with discomfort and infection over the weekend. Ordered urine culture to isolate pathogen. Must proceed with urology evaluation as planned Monday.  - Urine Culture - levofloxacin (LEVAQUIN) 500 MG tablet; Take 1 tablet (500 mg total) by mouth daily.  Dispense: 7 tablet; Refill: 0 -  phenazopyridine (PYRIDIUM) 200 MG tablet; Take 1 tablet (200 mg total) by mouth 3 (three) times daily as needed for pain.  Dispense: 10 tablet; Refill: 0  2. Bladder infection, chronic Recent evaluation by Dr. Matilde Sprang (urologist) showed a solid mass in the left kidney on renal ultrasound and scheduled for CT scan of abdomen and pelvis with and without contrast on 05-05-19. - Urine Culture - levofloxacin (LEVAQUIN) 500 MG tablet; Take 1 tablet (500 mg total) by mouth daily.  Dispense: 7 tablet; Refill: 0  3. Chronic diastolic heart failure ([place]) Followed by Dr. Rockey Situ (cardiologist) with history of chronic atrial fibrillation. Legs are feeling tense and some shortness of breath walking into the office today. Has not been taking the Furosemide 20 mg BID as Dr. Rockey Situ prescribed due to burning with urination. Must use Pyridium regularly since [place] not much help, so she can get the excess fluid out of lower legs and lungs. May need to go to ER if breathing worsens over the weekend. Follow up with cardiologist as needed.     Vernie Murders, PA  [facility]

## 2019-05-05 ENCOUNTER — Other Ambulatory Visit: Payer: Self-pay

## 2019-05-05 ENCOUNTER — Ambulatory Visit
Admission: RE | Admit: 2019-05-05 | Discharge: 2019-05-05 | Disposition: A | Discharge status: Home / Self Care | Payer: Medicare Other | Source: Ambulatory Visit | Attending: Urology | Admitting: Urology

## 2019-05-05 DIAGNOSIS — N281 Cyst of kidney, acquired: Secondary | ICD-10-CM | POA: Diagnosis not present

## 2019-05-05 DIAGNOSIS — N2889 Other specified disorders of kidney and ureter: Secondary | ICD-10-CM | POA: Diagnosis not present

## 2019-05-05 LAB — POCT I-STAT CREATININE: Creatinine, Ser: 0.8 mg/dL (ref 0.44–1.00)

## 2019-05-05 MED ORDER — IOHEXOL 300 MG/ML  SOLN
100.0000 mL | Freq: Once | INTRAMUSCULAR | Status: AC | PRN
  Administered 2019-05-05: 100 mL via INTRAVENOUS

## 2019-05-05 NOTE — Telephone Encounter (Signed)
Please advise 

## 2019-05-06 LAB — URINE CULTURE

## 2019-05-06 NOTE — Progress Notes (Signed)
05/07/19 10:09 AM   Tracy Mann 1933-08-22 [id number]  Referring provider: Jerrol Banana., MD [address] [place],  [place] [address]  Chief Complaint  Patient presents with  . renal mass    HPI: Tracy Mann is a 86 y.o. F who returns today for the evaluation and management of a renal mass. Accompanied by son via phone.   She presented to office on 02/24/19 for bladder infections. She was switched from prophylaxis to trimethoprim by Dr. Matilde Sprang.  She was followed up with a RUS on 04/11/19 indicating a 3.2 cm solid appearing mass at the mid-upper left kidney.    PCP visit 05/02/19 c/o of dysuria onset 1 to 4 weeks prior. Her urine on dip was positive for protein and leukocytes. Associated urine culture positive as below.   Recent CT Abd/pelvis 05/05/19 indicated signs of cystitis and bilateral renal cysts w/o renal mass.   She reports of burning with urination in urethra. She experienced headaches with trimethoprim which she took prior to allergy season. She was switched from trimethoprim to levofloxacin which relieved her symptoms. She has 2 more tablets left.   She has experience with topical estrogen cream however does not use it due to concerns of swelling.   +Urine culture  05/02/19 indicativ e of K. Pneumoniae resistant to ampicillin, nitrofurantoin   PMH: Past Medical History:  Diagnosis Date  . Acute myocardial infarction of inferior wall ([facility])    a. 10/2005 - Presumed to be 2/2 to a coronary embolus in setting of persistent Afib-->Cath 2014 relatively nl cors, EF 45-50% w/ severe distal inferior/apical inferior HK w/ aneursymal appearance.  . Arthritis   . Chronic anticoagulation    a. warfarin.  . Chronic atrial fibrillation ([facility])    a. CHA2DS2VASc = 6--> warfarin.  . Chronic combined systolic and diastolic CHF (congestive heart failure) ([facility])    a. 10/2012 Echo: EF 35-40%, diff HK, mild MR, mild biatrial enlargement;  b. 11/2012 EF 45-50% by  LV gram; c. echo 07/2014: EF 40-45%, mod conc LVH, mod MR, severe biatrial enlargement, PASP 45 mm Hg c. Echo 01/2018 EF 45-50%, duffuse hypokinesis, mild MR, LA mod dilated, norm RVSF, dilated IVC  . Essential hypertension   . Hyperlipidemia, mixed   . Mixed Ischemic/Nonischemic Cardiomyopathy    a. 10/2012 Echo: EF 35-40%;  b. 11/2012 EF 45-50% by LV gram.  . Nonobstructive coronary artery disease    a. 2007 inferior wall MI thought to be secondary to thrombus from atrial fib b. 2014 cath with R dominant coronary arterial system with mild luminal irregularities c. Myoview 05/2015 old inferior MI, no new ischemic changes  . Obesity   . Thyroid disease    hypothyroidism  . Type II diabetes mellitus ([place])    a. 07/2018 A1c 8.7 - long term insulin use  . Venous insufficiency   . Vitamin D deficiency     Surgical History: Past Surgical History:  Procedure Laterality Date  . CARDIAC CATHETERIZATION  11/2012   [facility];EF 45-50%  . CHOLECYSTECTOMY  1999  . TUBAL LIGATION  1968  . VESICOVAGINAL FISTULA CLOSURE W/ TAH  1996    Home Medications:  Allergies as of 05/07/2019      Reactions   Other Anaphylaxis, Swelling   'tongue swelling'   Sulfa Antibiotics Anaphylaxis   Cephalexin    Blisters   Clarithromycin Other (See Comments)   Hives, headaches, hard time swelling, felt like throat was closing up Hives, headaches, hard  time swelling, felt like throat was closing up   Diphenhydramine Other (See Comments)   Estrogens Conjugated    Estrogens, Conjugated Other (See Comments)   Sulfonamide Derivatives Swelling   'tongue swelling'   Nitrofurantoin Nausea Only      Medication List       Accurate as of May 07, 2019 11:59 PM. If you have any questions, ask your nurse or doctor.        STOP taking these medications   estradiol 0.1 MG/GM vaginal cream Commonly known as: ESTRACE Stopped by: Hollice Espy, MD   fluconazole 100 MG tablet Commonly known as: Diflucan Stopped by: Hollice Espy, MD   mirabegron ER 50 MG Tb24 tablet Commonly known as: MYRBETRIQ Stopped by: Hollice Espy, MD   nitrofurantoin (macrocrystal-monohydrate) 100 MG capsule Commonly known as: MACROBID Stopped by: Hollice Espy, MD   nitrofurantoin 100 MG capsule Commonly known as: Macrodantin Stopped by: Hollice Espy, MD     TAKE these medications   Accu-Chek Aviva Plus test strip Generic drug: glucose blood USE WITH METER TO CHECK GLUCOSE BEFORE MEALS AND AT BEDTIME   clotrimazole-betamethasone cream Commonly known as: Lotrisone Apply 1 application topically 2 (two) times daily.   CO Q 10 PO Take by mouth.   Combivent Respimat 20-100 MCG/ACT Aers respimat Generic drug: Ipratropium-Albuterol 1 puff every 6 (six) hours as needed.   diltiazem 30 MG tablet Commonly known as: CARDIZEM TAKE 1 TABLET (30 MG TOTAL) BY MOUTH 3 (THREE) TIMES DAILY AS NEEDED.   ezetimibe 10 MG tablet Commonly known as: ZETIA TAKE 1 TABLET BY MOUTH EVERY DAY   flavoxATE 100 MG tablet Commonly known as: URISPAS Take 1 tablet (100 mg total) by mouth 2 (two) times daily as needed for bladder spasms.   fluticasone 50 MCG/ACT nasal spray Commonly known as: FLONASE SPRAY TWO SPRAYS IN EACH NOSTRIL ONCE DAILY   furosemide 20 MG tablet Commonly known as: LASIX Take 1 tablet (20 mg total) by mouth 2 (two) times daily.   HYDROcodone-acetaminophen 10-325 MG tablet Commonly known as: NORCO Take 1 tablet by mouth every 6 (six) hours as needed. Start taking on: May 22, 2019 What changed: Another medication with the same name was removed. Continue taking this medication, and follow the directions you see here. Changed by: Hollice Espy, MD   insulin lispro 100 UNIT/ML KwikPen Commonly known as: HumaLOG KwikPen INJECT 10-20 UNITS THREE TIMES DAILY WITH EACH MEAL.   levofloxacin 500 MG tablet Commonly known as: LEVAQUIN Take 1 tablet (500 mg total) by mouth daily.   levothyroxine 125 MCG  tablet Commonly known as: SYNTHROID TAKE 1 TABLET (125 MCG TOTAL) BY MOUTH DAILY BEFORE BREAKFAST.   lisinopril 2.5 MG tablet Commonly known as: ZESTRIL TAKE 1 TABLET BY MOUTH EVERYDAY AT BEDTIME   LORazepam 1 MG tablet Commonly known as: ATIVAN TAKE 1 TABLET BY MOUTH TWICE A DAY AS NEEDED   Magnesium 500 MG Caps Take 500 mg by mouth daily.   nitroGLYCERIN 0.4 MG SL tablet Commonly known as: NITROSTAT Place 1 tablet (0.4 mg total) under the tongue every 5 (five) minutes as needed for chest pain.   pantoprazole 20 MG tablet Commonly known as: PROTONIX Take 1 tablet (20 mg total) by mouth daily.   phenazopyridine 200 MG tablet Commonly known as: Pyridium Take 1 tablet (200 mg total) by mouth 3 (three) times daily as needed for pain.   RELION PEN NEEDLE 31G/8MM 31G X 8 MM Misc Generic drug: Insulin Pen Needle Use  with insulin pen three times daily   terconazole 0.4 % vaginal cream Commonly known as: TERAZOL 7 One thin layer twice daily.   trimethoprim 100 MG tablet Commonly known as: TRIMPEX Take 1 tablet (100 mg total) by mouth daily.   Vitamin D (Ergocalciferol) 1.25 MG (50000 UNIT) Caps capsule Commonly known as: DRISDOL Take 50,000 Units by mouth. Going to restart   warfarin 2 MG tablet Commonly known as: COUMADIN Take as directed by the anticoagulation clinic. If you are unsure how to take this medication, talk to your nurse or doctor. Original instructions: Take 1 tablet (2 mg total) by mouth daily.   warfarin 2.5 MG tablet Commonly known as: COUMADIN Take as directed by the anticoagulation clinic. If you are unsure how to take this medication, talk to your nurse or doctor. Original instructions: TAKE 1 TABLET BY MOUTH EVERY DAY   warfarin 4 MG tablet Commonly known as: Coumadin Take as directed by the anticoagulation clinic. If you are unsure how to take this medication, talk to your nurse or doctor. Original instructions: Take 1 tablet (4 mg total) by  mouth daily.   warfarin 3 MG tablet Commonly known as: COUMADIN Take as directed by the anticoagulation clinic. If you are unsure how to take this medication, talk to your nurse or doctor. Original instructions: TAKE 1 TABLET BY MOUTH DAILY       Allergies:  Allergies  Allergen Reactions  . Other Anaphylaxis and Swelling    'tongue swelling'  . Sulfa Antibiotics Anaphylaxis  . Cephalexin     Blisters  . Clarithromycin Other (See Comments)    Hives, headaches, hard time swelling, felt like throat was closing up Hives, headaches, hard time swelling, felt like throat was closing up  . Diphenhydramine Other (See Comments)  . Estrogens Conjugated   . Estrogens, Conjugated Other (See Comments)  . Sulfonamide Derivatives Swelling    'tongue swelling'  . Nitrofurantoin Nausea Only    Family History: Family History  Problem Relation Age of Onset  . Heart disease Mother   . Thyroid disease Father     Social History:  reports that she has never smoked. She has never used smokeless tobacco. She reports that she does not drink alcohol or use drugs.   Physical Exam: BP 124/80   Pulse 98   Ht 5\' 5"  (1.651 m)   Wt 232 lb (105.2 kg)   BMI 38.61 kg/m   Constitutional:  Alert and oriented, No acute distress.  In wheel chair.  HEENT: [place] AT, moist mucus membranes.  Trachea midline, no masses. Cardiovascular: No clubbing, cyanosis, or edema. Respiratory: Normal respiratory effort, no increased work of breathing. Skin: No rashes, bruises or suspicious lesions. Neurologic: Grossly intact, no focal deficits, moving all 4 extremities. Psychiatric: Normal mood and affect.  Laboratory Data:  Lab Results  Component Value Date   CREATININE 0.80 05/05/2019   Pertinent Imaging: CLINICAL DATA:  Renal mass. Abnormal ultrasound. Also with some reported abdominal pain.  EXAM: CT ABDOMEN AND PELVIS WITHOUT AND WITH CONTRAST  TECHNIQUE: Multidetector CT imaging of the abdomen and  pelvis was performed following the standard protocol before and following the bolus administration of intravenous contrast.  CONTRAST:  153mL OMNIPAQUE IOHEXOL 300 MG/ML  SOLN  COMPARISON:  Renal sonogram of 04/11/2019  FINDINGS: Lower chest: Areas of ground-glass attenuation and septal thickening at the lung bases. Ground-glass is mainly geographic. No consolidation or pleural effusion.  Heart size is enlarged with four-chamber enlargement. Heart is incompletely imaged.  Hepatobiliary: No focal, suspicious hepatic lesion. Nodular hepatic contours. Portal vein is patent. Post cholecystectomy with mild extrahepatic biliary ductal distension likely post cholecystectomy baseline.  Pancreas: Pancreas without pancreatic inflammation or ductal dilation.  Spleen: Spleen is normal size without focal lesion.  Adrenals/Urinary Tract: Adrenal glands are normal.  Ptotic right kidney. Mild cortical scarring bilaterally. Small low-density lesions in the bilateral kidneys too small for definitive characterization on the right but likely cysts.  A single interpolar presumed cyst measuring 6 mm on the left and a cyst in the lower pole measuring 12 mm by 12 mm (image 87, series 16)  No hydronephrosis or nephrolithiasis.  Perivesical stranding, urinary bladder is under distended but question of generalized bladder wall thickening.  Stomach/Bowel: No acute gastrointestinal process. Colonic diverticulosis and diverticular changes. Appendix is not visualized no periappendiceal stranding.  Vascular/Lymphatic: Calcified and noncalcified atheromatous plaque in the abdominal aorta without aneurysm. No retroperitoneal, upper abdominal or pelvic lymphadenopathy.  Reproductive: Hysterectomy. No adnexal mass.  Other: Small right paramidline periumbilical hernia contains only fat.  Musculoskeletal: Spinal degenerative change without acute or destructive bone  process.  IMPRESSION: 1. Signs of cystitis. 2. Bilateral renal cysts without renal mass. 3. Nodular hepatic contour, correlate with any clinical signs of liver disease. 4. Ground-glass attenuation at the lung bases with mild bronchial wall thickening, this may be sequela of small airways disease/bronchiolitis. Correlate with any clinical signs of infection or recent history of respiratory symptoms. 5. Cardiomegaly.  Aortic Atherosclerosis (ICD10-I70.0).   Electronically Signed   By: Zetta Bills M.D.   On: 05/05/2019 15:03  I have personally reviewed the images and agree with radiologist interpretation.   Assessment & Plan:    1. Left renal mass Not appreciated on CT scan, likely secondary to scarring No pathologic findings  No further imaging needed   2. Dysuria Levofloxacin relieved symptoms -she is interested in continuing this medication indefinitely but would strongly discourage this due to concern for bacterial resistance and secondary infection  Recommended to restart topical estrogen cream and drink plenty of fluid , reassured pt with concerns of swelling which has to do more with oral than topical   Advised pt to call back for same day appt.if symptoms reoccur at which she should have a cath  UA with lidocaine per patient request   F/u PA 6 weeks to reassess urinary symptoms/ recurrent UTI vs. [place] [address] [place] [facility] [place], [place] [address] [phone]  I, Lucas Mallow, am acting as a scribe for Dr. Hollice Espy,  I have reviewed the above documentation for accuracy and completeness, and I agree with the above.   Hollice Espy, MD    The following was provided to the patient today summarizing visit:  We discussed the following today at your visit:  1.  CT scan did not indicate a renal mass.  No further surveillance imaging or follow-up is needed for this.  This is awesome news.  2.   Complete the antibiotic course as prescribed by her primary care physician, Levaquin for 2 more days.  3.  Strongly recommend resuming topical estrogen cream.  Discussed that the swelling and fluid retention that she is concerned about in fact is more of a concern with oral not topical.  Think that she would benefit greatly from resuming this medication.  4.  If she thinks that she has another infection, and like her to be seen the same day or the following day for a visit  with our PA for a catheterized urine specimen.

## 2019-05-07 ENCOUNTER — Other Ambulatory Visit: Payer: Self-pay

## 2019-05-07 ENCOUNTER — Ambulatory Visit: Payer: Medicare Other | Admitting: Urology

## 2019-05-07 ENCOUNTER — Encounter: Payer: Self-pay | Admitting: Urology

## 2019-05-07 VITALS — BP 124/80 | HR 98 | Ht 65.0 in | Wt 232.0 lb

## 2019-05-07 DIAGNOSIS — N2889 Other specified disorders of kidney and ureter: Secondary | ICD-10-CM | POA: Diagnosis not present

## 2019-05-07 DIAGNOSIS — R300 Dysuria: Secondary | ICD-10-CM

## 2019-05-07 NOTE — Patient Instructions (Signed)
We discussed the following today at your visit:  1.  CT scan did not indicate a renal mass.  No further surveillance imaging or follow-up is needed for this.  This is awesome news.  2.  Complete the antibiotic course as prescribed by her primary care physician, Levaquin for 2 more days.  3.  Strongly recommend resuming topical estrogen cream.  Discussed that the swelling and fluid retention that she is concerned about in fact is more of a concern with oral not topical.  Think that she would benefit greatly from resuming this medication.  4.  If she thinks that she has another infection, and like her to be seen the same day or the following day for a visit with our PA for a catheterized urine specimen.

## 2019-05-12 ENCOUNTER — Encounter (INDEPENDENT_AMBULATORY_CARE_PROVIDER_SITE_OTHER): Payer: Medicare Other | Admitting: Vascular Surgery

## 2019-05-20 ENCOUNTER — Ambulatory Visit: Payer: Medicare Other | Admitting: Dermatology

## 2019-05-20 ENCOUNTER — Ambulatory Visit: Payer: Self-pay

## 2019-05-20 DIAGNOSIS — E559 Vitamin D deficiency, unspecified: Secondary | ICD-10-CM | POA: Diagnosis not present

## 2019-05-20 DIAGNOSIS — Z794 Long term (current) use of insulin: Secondary | ICD-10-CM | POA: Diagnosis not present

## 2019-05-20 DIAGNOSIS — E1169 Type 2 diabetes mellitus with other specified complication: Secondary | ICD-10-CM | POA: Diagnosis not present

## 2019-05-20 DIAGNOSIS — E1165 Type 2 diabetes mellitus with hyperglycemia: Secondary | ICD-10-CM | POA: Diagnosis not present

## 2019-05-21 ENCOUNTER — Telehealth: Payer: Self-pay | Admitting: Cardiovascular Disease

## 2019-05-21 DIAGNOSIS — E559 Vitamin D deficiency, unspecified: Secondary | ICD-10-CM | POA: Insufficient documentation

## 2019-05-21 NOTE — Telephone Encounter (Signed)
Pt calling in to report low o2 readings per rheumatology appt yesterday. She has been monitoring at home over the past several weeks and sats with exertion are around 85, improve at rest to low 90s. Pt reports fatigue and sob with minimal activity. "just getting in car and getting dressed is a struggle".  Wt at last OV 235, weight yesterday 238. Reports legs are heavy and hard as a rock. I asked about sleeping dyspnea and she reports that last ov note incorrect, its not that she is noncompliant with cpap, she reports never having one. Pt is able to speak in full sentences. No audible respiratory distress heard.   I urged her to come in for OV today but she reports having an appt this afternoon that she cannot reschedule. She agrees to appt tomorrow am with Laurann Montana, NP and will call for immediate assistance if sx change or worsen.   Advised pt to call for any further questions or concerns.

## 2019-05-21 NOTE — Progress Notes (Signed)
Office Visit    Patient Name: Tracy Mann Date of Encounter: 05/22/2019  Primary Care Provider:  Jerrol Banana., MD Primary Cardiologist:  Ida Rogue, MD Electrophysiologist:  None   Chief Complaint    Tracy Mann is a 86 y.o. female with a hx of chronic atrial fibrillation on Coumadin, chronic combined systolic and diastolic heart failure, HTN, HLD, morbid obesity, moderate pulmonary hypertension, CAD s/p inferior wall MI 2007 felt to be secondary to thrombus from atrial fibrillation, lower extremity edema  presents today for shortness of breath.   Past Medical History    Past Medical History:  Diagnosis Date  . Acute myocardial infarction of inferior wall ([place])    a. 10/2005 - Presumed to be 2/2 to a coronary embolus in setting of persistent Afib-->Cath 2014 relatively nl cors, EF 45-50% w/ severe distal inferior/apical inferior HK w/ aneursymal appearance.  . Arthritis   . Chronic anticoagulation    a. warfarin.  . Chronic atrial fibrillation (HCC)    a. CHA2DS2VASc = 6--> warfarin.  . Chronic combined systolic and diastolic CHF (congestive heart failure) ([place])    a. 10/2012 Echo: EF 35-40%, diff HK, mild MR, mild biatrial enlargement;  b. 11/2012 EF 45-50% by LV gram; c. echo 07/2014: EF 40-45%, mod conc LVH, mod MR, severe biatrial enlargement, PASP 45 mm Hg c. Echo 01/2018 EF 45-50%, duffuse hypokinesis, mild MR, LA mod dilated, norm RVSF, dilated IVC  . Essential hypertension   . Hyperlipidemia, mixed   . Mixed Ischemic/Nonischemic Cardiomyopathy    a. 10/2012 Echo: EF 35-40%;  b. 11/2012 EF 45-50% by LV gram.  . Nonobstructive coronary artery disease    a. 2007 inferior wall MI thought to be secondary to thrombus from atrial fib b. 2014 cath with R dominant coronary arterial system with mild luminal irregularities c. Myoview 05/2015 old inferior MI, no new ischemic changes  . Obesity   . Thyroid disease    hypothyroidism  . Type II diabetes mellitus ([place])     a. 07/2018 A1c 8.7 - long term insulin use  . Venous insufficiency   . Vitamin D deficiency    Past Surgical History:  Procedure Laterality Date  . CARDIAC CATHETERIZATION  11/2012   [facility];EF 45-50%  . CHOLECYSTECTOMY  1999  . TUBAL LIGATION  1968  . VESICOVAGINAL FISTULA CLOSURE W/ TAH  1996    Allergies  Allergies  Allergen Reactions  . Other Anaphylaxis and Swelling    'tongue swelling'  . Sulfa Antibiotics Anaphylaxis  . Cephalexin     Blisters  . Clarithromycin Other (See Comments)    Hives, headaches, hard time swelling, felt like throat was closing up Hives, headaches, hard time swelling, felt like throat was closing up  . Diphenhydramine Other (See Comments)  . Estrogens Conjugated   . Estrogens, Conjugated Other (See Comments)  . Sulfonamide Derivatives Swelling    'tongue swelling'  . Nitrofurantoin Nausea Only    History of Present Illness    Tracy Mann is a 86 y.o. female with a hx of chronic atrial fibrillation on Coumadin, chronic combined systolic and diastolic heart failure, HTN, HLD, morbid obesity, moderate pulmonary hypertension, CAD s/p inferior wall MI 2007 felt to be secondary to thrombus from atrial fibrillation, lower extremity edema, DM2, arthralgias. She was last seen 04/28/19 by Dr. Rockey Situ.  Echo 01/2018 with LVEF 40-45%, diffuse ypokinesis, mild MR, moderately dilated LA, RV normal systolic pressure. She wore a ZIO monitor 07/2018 showing persistent atrial  fib with variable rates. Noted pauses up to 4.2 seconds.    Telephone encounter 05/21/19 noting shortness of breath. She has low oximetry reading while at her rheumatologist. Checked at home with O2 85% while ambulating and low 90's while at rest. Reported "legs heavy and hard as a rock". Reported she was never given CPAP despite hx of OSA.   At visit 04/28/19 noted to be up to 235lbs compared to previous visit a few months prior with weight 227lb. She is unable to wear compression hose. Has had  reccurent difficulties with UTI and urethral irritation which affect her willingness to take Lasix.   Tells me she had been taking 20mg  one day and 40mg  the other day. Had some leg cramps that made her worry. She was not taking potassium so she purchased some over the counter potassium. This enabled her to tolerate the Lasix without bladder cramps or burning .   She has been having notable dyspnea both at rest and with exertion.  This has been worsening over the last 1 to 2 weeks.  She endorses orthopnea but no PND.  Tells me she has been having some chest pain on her left side. Does not radiate. Occurs randomly both at rest and with activity. Tells me it goes away fairly quickly. Happens after she eats.  She avoids fried foods, acidic foods in the setting of her interstitial cystitis.  Tells me her lower extremities are swollen and feel "hard as rocks ".  She has also noticed more swelling in her abdomen.  Reports no weeping of her lower extremities.  She is previously been unable to tolerate compression stockings.  She has an upcoming appointment with Dr. Delana Meyer of [facility].   Her weight today is up to 240lb. 5lb increase from last office visit 1 month ago. She had a pulse ox of 94% today on room air.   Labs 05/20/19 via [facility]where  K 3.6, creatinine 0.7, GFR 80, A1c 8.3, vitamin D 29.4, TSH 1.01  EKGs/Labs/Other Studies Reviewed:   The following studies were reviewed today:  Echo 02/15/18 Left ventricle: The cavity size was normal. Systolic function was    mildly to moderately reduced. The estimated ejection fraction was    in the range of 40% to 45%. Diffuse hypokinesis. Regional wall    motion abnormalities cannot be excluded. The study is not    technically sufficient to allow evaluation of LV diastolic    function.  - Mitral valve: There was mild regurgitation.  - Left atrium: The atrium was moderately dilated.  - Right ventricle: Systolic function was normal.  - Inferior vena  cava: The vessel was dilated. The respirophasic    diameter changes were blunted (< 50%), consistent with elevated    central venous pressure.   ZIO 08/30/18 Atrial fibrillation, 100% burden Rate ranging from 30-188 bpm (avg of 83 bpm).   12 Ventricular Tachycardia runs occurred, the run with the fastest interval lasting 12 beats with a max rate of 203 bpm, the longest lasting 8 beats with an avg rate of 133 bpm.    3 Pauses occurred, the longest lasting 4.2 secs (14 bpm). On 09/01/18: at 8:06 Am, 4.2 sec On 09/02/18 at 9:15 AM, 3.2 sec On 09/08/18 at 10:21 AM, 3.1 sec.    Isolated VEs were occasional (1.7%, [id number]), VE Couplets were rare (<1.0%, 2096), and VE Triplets were rare (<1.0%, 38). Ventricular Bigeminy and Trigeminy were present.  EKG:  EKG is ordered today.  The ekg ordered  today demonstrates rate controlled atrial fibrillation 89 pm with nonspecific intraventricular block and poor R wave progression.   Recent Labs: 08/29/2018: ALT 13; BUN 18; Hemoglobin 13.5; Magnesium 2.1; Platelets 225; Potassium 4.9; Sodium 139; TSH 0.888 05/05/2019: Creatinine, Ser 0.80  Recent Lipid Panel    Component Value Date/Time   CHOL 172 01/17/2019 1127   TRIG 119 01/17/2019 1127   HDL 57 01/17/2019 1127   CHOLHDL 3.0 01/17/2019 1127   CHOLHDL 6.0 05/21/2015 0142   VLDL 45 (H) 05/21/2015 0142   LDLCALC 94 01/17/2019 1127    Home Medications   Current Meds  Medication Sig  . ACCU-CHEK AVIVA PLUS test strip USE WITH METER TO CHECK GLUCOSE BEFORE MEALS AND AT BEDTIME  . ezetimibe (ZETIA) 10 MG tablet TAKE 1 TABLET BY MOUTH EVERY DAY  . flavoxATE (URISPAS) 100 MG tablet Take 1 tablet (100 mg total) by mouth 2 (two) times daily as needed for bladder spasms.  . fluticasone (FLONASE) 50 MCG/ACT nasal spray SPRAY TWO SPRAYS IN EACH NOSTRIL ONCE DAILY  . furosemide (LASIX) 20 MG tablet Take 1 tablet (20 mg total) by mouth 2 (two) times daily.  Marland Kitchen HYDROcodone-acetaminophen (NORCO) 10-325 MG tablet Take 1  tablet by mouth every 6 (six) hours as needed.  . insulin glargine (LANTUS SOLOSTAR) 100 UNIT/ML Solostar Pen Inject 20 units nightly between 8-10 PM. Adjust as directed. Max dose is 50 units daily.  . insulin lispro (HUMALOG KWIKPEN) 100 UNIT/ML KwikPen INJECT 10-20 UNITS THREE TIMES DAILY WITH EACH MEAL.  Marland Kitchen Insulin Pen Needle (RELION PEN NEEDLE 31G/8MM) 31G X 8 MM MISC Use with insulin pen three times daily  . levothyroxine (SYNTHROID) 125 MCG tablet TAKE 1 TABLET (125 MCG TOTAL) BY MOUTH DAILY BEFORE BREAKFAST.  Marland Kitchen lisinopril (ZESTRIL) 2.5 MG tablet TAKE 1 TABLET BY MOUTH EVERYDAY AT BEDTIME  . LORazepam (ATIVAN) 1 MG tablet TAKE 1 TABLET BY MOUTH TWICE A DAY AS NEEDED  . Magnesium 500 MG CAPS Take 500 mg by mouth daily.   . nitroGLYCERIN (NITROSTAT) 0.4 MG SL tablet Place 1 tablet (0.4 mg total) under the tongue every 5 (five) minutes as needed for chest pain.  . pantoprazole (PROTONIX) 20 MG tablet Take 1 tablet (20 mg total) by mouth daily.  . phenazopyridine (PYRIDIUM) 200 MG tablet Take 1 tablet (200 mg total) by mouth 3 (three) times daily as needed for pain.  Marland Kitchen Potassium 99 MG TABS Take 99 mg 3 tablets when taking Lasix.  . Vitamin D, Ergocalciferol, (DRISDOL) 50000 units CAPS capsule Take 50,000 Units by mouth. Going to restart  . warfarin (COUMADIN) 2 MG tablet Take 1 tablet (2 mg total) by mouth daily.  Marland Kitchen warfarin (COUMADIN) 2.5 MG tablet TAKE 1 TABLET BY MOUTH EVERY DAY  . warfarin (COUMADIN) 3 MG tablet TAKE 1 TABLET BY MOUTH DAILY  . warfarin (COUMADIN) 4 MG tablet Take 1 tablet (4 mg total) by mouth daily.    Review of Systems   Review of Systems  Constitution: Positive for malaise/fatigue. Negative for chills and fever.  Cardiovascular: Positive for chest pain, dyspnea on exertion and leg swelling. Negative for near-syncope, orthopnea, palpitations and syncope.  Respiratory: Positive for shortness of breath. Negative for cough and wheezing.   Gastrointestinal: Negative for  nausea and vomiting.  Neurological: Negative for dizziness, light-headedness and weakness.   All other systems reviewed and are otherwise negative except as noted above.  Physical Exam    VS:  BP (!) 126/56 (BP Location: Right Arm, Patient  Position: Sitting, Cuff Size: Large)   Pulse 89   Ht 5' 5.5" (1.664 m)   Wt 240 lb 6 oz (109 kg)   SpO2 94%   BMI 39.39 kg/m  , BMI Body mass index is 39.39 kg/m. GEN: Well nourished, well developed, in no acute distress. HEENT: normal. Neck: Supple, no JVD, carotid bruits, or masses. Cardiac: RRR, no murmurs, rubs, or gallops. No clubbing, cyanosis.  Radials/DP/PT 2+ and equal bilaterally.  Bilateral lower extremity with 3+ pitting edema. Respiratory:  Respirations regular and unlabored.  Bilateral lower lobes with crackles.  Bilateral upper lobes with expiratory wheeze. GI: Soft, nontender, nondistended, BS + x 4. MS: No deformity or atrophy. Skin: Warm and dry, no rash.  Skin on bilateral lower extremities is mildly reddened and notably firm to touch. Neuro:  Strength and sensation are intact. Psych: Normal affect.  Assessment & Plan    1. HFrEF/SOB/DOE/LE edema -Echo 02/15/2018 with LVEF 40-45%, indeterminate LV diastolic parameters, mild MR, LA moderately dilated.  She is grossly volume overloaded, up 5 pounds from office visit 1 month ago, with Reds vest reading of 52%.  Increase Lasix to 40 mg daily.  As she already took 20 mg today she will take an additional 20 mg when she gets home from this appointment.  She will take 2 tablets for a total dose from 40 mg every morning starting tomorrow.  She will return to the office in 4 days for reassessment. In the interim, ED precautions were reviewed.  Start potassium 20 mEq daily.  K4/20/21 via care everywhere 3.6.  Low-sodium, heart healthy diet.  Recommend elevating lower extremities when sitting.  Record daily weights.  Report weight gain of 3 pounds overnight or 5 pounds in 1  week.  Consider ordering repeat echocardiogram at follow-up for reassessment of LVEF  GDMT: Presently on low dose Lisinopril. Will monitor blood pressure after diuresis to consider up-titration of heart failure therapies. Could consider Coreg or Entresto with its new indication for HFpEF.   2. HLD, LDL goal <70 -lipid panel 01/17/2019 total cholesterol 172, HDL 57, triglycerides 119, LDL 94.  Continue Zetia 10 mg daily. She is not on statin for unclear reason, will discuss with PCP as there is strong indication given CAD and DM2. If previously intolerant of statin consider transition to Nexlizet vs referral to lipid clinic for PCSK9i for LDL goal <70 in setting of CAD.   3. HTN -BP well controlled.  Continue present antihypertensive regimen including lisinopril 2.5 mg daily.  4. CAD -s/p inferior wall MI in 2007.  Lexiscan Myoview 2017 low risk study.  Reports intermittent chest pain occurs both at rest and with activity.  Anticipate this is secondary to her dyspnea and volume overload. EKG today without acute ST/T wave changes.  GDMT includes Zetia.  No aspirin secondary to chronic anticoagulation.  Consider addition of beta-blocker at follow-up.  5. Chronic atrial fibrillation/Chronic anticoagulation - Rate controlled on EKG today. Chronic anticoagulation for CHADS2VASc of at least 7 (agex2, gender, HTN, HF, DM2, CAD). Denies bleeding complications. Follows with coumadin clinic at her PCP.  6. OSA - Unclear management plan. Will need discussion at next office visit of whether or not she has been tested for sleep apnea recently, never started on CPAP, etc. May require referral to pulmonology. As she had nocturnal pauses noted on recent ZIO 07/2018 will require evaluation and treatment of OSA.    7. DM2 - Recently re-established with Dr. Ancil Boozer of endocrinology. A1c 8.3 most recently.  Disposition: Follow up on Monday with Dr. Wallie Renshaw, NP 05/22/2019, 12:55 PM

## 2019-05-21 NOTE — Telephone Encounter (Signed)
Pt reports she has been taking lasix 1-2 doses daily based on how she is feeling. She reports that she has not been taking it today. Pt reports taking 1 potassium supplement (over the counter 99 mg total) when taking lasix.   I updated her on POC for next 24 hours per Laurann Montana, NP request. I advised patient to take 3 tablets (99 mg per tablet) each time she takes lasix. She will come into appt tomorrow as scheduled to further discuss POC.   Routing to Laurann Montana, NP to make her aware.

## 2019-05-21 NOTE — Telephone Encounter (Signed)
Thanks for letting me know.  Can we confirm how she is taking her Furosemide? Was prescribed at 20mg  twice daily but at last office visit looks like she was taking daily. Would recommend taking twice today, morning dose and then dose this afternoon afternoon (sometime 1pm-3pm so she is not up all evening going to the restroom). Recommend elevating her legs as much as possible today.   We can discuss OSA/CPAP at office visit. May require referral to pulmonology.   As you told her, if she has worsening shortness of breath recommend calling for immediate assistance or being seen in the ED.   Loel Dubonnet, NP

## 2019-05-21 NOTE — Telephone Encounter (Signed)
Pt c/o of Chest Pain: STAT if CP now or developed within 24 hours  1. Are you having CP right now? Not now  2. Are you experiencing any other symptoms (ex. SOB, nausea, vomiting, sweating)?  Sob, oxygen level is 85  3. How long have you been experiencing CP? Since last time pt saw Dr. Rockey Situ  4. Is your CP continuous or coming and going? Comes and goes  5. Have you taken Nitroglycerin? no?

## 2019-05-22 ENCOUNTER — Ambulatory Visit (INDEPENDENT_AMBULATORY_CARE_PROVIDER_SITE_OTHER): Payer: Medicare Other | Admitting: Family

## 2019-05-22 ENCOUNTER — Encounter: Payer: Self-pay | Admitting: Family

## 2019-05-22 ENCOUNTER — Telehealth: Payer: Self-pay | Admitting: Family

## 2019-05-22 ENCOUNTER — Other Ambulatory Visit: Payer: Self-pay

## 2019-05-22 ENCOUNTER — Encounter (INDEPENDENT_AMBULATORY_CARE_PROVIDER_SITE_OTHER): Payer: Medicare Other | Admitting: Vascular Surgery

## 2019-05-22 VITALS — BP 126/56 | HR 89 | Ht 65.5 in | Wt 240.4 lb

## 2019-05-22 DIAGNOSIS — I5042 Chronic combined systolic (congestive) and diastolic (congestive) heart failure: Secondary | ICD-10-CM | POA: Diagnosis not present

## 2019-05-22 DIAGNOSIS — R0600 Dyspnea, unspecified: Secondary | ICD-10-CM

## 2019-05-22 DIAGNOSIS — I25118 Atherosclerotic heart disease of native coronary artery with other forms of angina pectoris: Secondary | ICD-10-CM

## 2019-05-22 DIAGNOSIS — I482 Chronic atrial fibrillation, unspecified: Secondary | ICD-10-CM | POA: Diagnosis not present

## 2019-05-22 DIAGNOSIS — E785 Hyperlipidemia, unspecified: Secondary | ICD-10-CM

## 2019-05-22 DIAGNOSIS — M7989 Other specified soft tissue disorders: Secondary | ICD-10-CM

## 2019-05-22 DIAGNOSIS — Z794 Long term (current) use of insulin: Secondary | ICD-10-CM

## 2019-05-22 DIAGNOSIS — R0609 Other forms of dyspnea: Secondary | ICD-10-CM

## 2019-05-22 DIAGNOSIS — G4733 Obstructive sleep apnea (adult) (pediatric): Secondary | ICD-10-CM

## 2019-05-22 DIAGNOSIS — E1159 Type 2 diabetes mellitus with other circulatory complications: Secondary | ICD-10-CM | POA: Diagnosis not present

## 2019-05-22 MED ORDER — POTASSIUM CHLORIDE CRYS ER 20 MEQ PO TBCR: 20.0000 meq | EXTENDED_RELEASE_TABLET | Freq: Every day | ORAL | 3 refills | Status: DC

## 2019-05-22 NOTE — Telephone Encounter (Signed)
Returned call to patient to discuss avs from office visit today. Pt reporting that avs does not match prescription bottle.   Per patient, there is written in ink instructions. Per note patient will inc to 2 tablet of k dur for 3 days. Then patient will resume ordered dose of 1 tablet (20 meq total) thereafter. Pt verbalized understanding and had no further questions.   Advised pt to call for any further questions or concerns.

## 2019-05-22 NOTE — Telephone Encounter (Signed)
Please call to discuss Potassium rx. States AVS does not match what pharmacist instructions say on her bottle.

## 2019-05-22 NOTE — Patient Instructions (Signed)
Medication Instructions:  Your physician has recommended you make the following change in your medication:   Increase your Lasix to 40mg  (2 tablets) in the morning. Take both tablets at the same time.  Start Potassium 16mEq daily.  Please stop your over the counter potassium, this new prescription will replace it.   *If you need a refill on your cardiac medications before your next appointment, please call your pharmacy*   Lab Work: Your recent blood work at endocrinology showed potassium of 3.6 (low normal) and normal kidney function.  No lab work ordered today.  Testing/Procedures: Your EKG today showed rate controlled atrial fibrillation which is a stable finding.   Your Reds Vest reading today was 52%. A "normal" reading is 35% or less. This tells [place] you have extra fluid causing your swelling and shortness of breath.   Follow-Up: At [facility], you and your health needs are our priority.  As part of our continuing mission to provide you with exceptional heart care, we have created designated Provider Care Teams.  These Care Teams include your primary Cardiologist (physician) and Advanced Practice Providers (APPs -  Physician Assistants and Nurse Practitioners) who all work together to provide you with the care you need, when you need it.  We recommend signing up for the patient portal called "MyChart".  Sign up information is provided on this After Visit Summary.  MyChart is used to connect with patients for Virtual Visits (Telemedicine).  Patients are able to view lab/test results, encounter notes, upcoming appointments, etc.  Non-urgent messages can be sent to your provider as well.   To learn more about what you can do with MyChart, go to [email or web address].    Your next appointment:   Monday  The format for your next appointment:   In Person  Provider:   You may see Ida Rogue, MD or one of the following Advanced Practice Providers on your designated Care Team:     Murray Hodgkins, NP  Christell Faith, PA-C  Laurann Montana, NP  Marrianne Mood, PA-C  Other Instructions   Weigh yourself daily and keep a log. We would expect your weight to go down as we have increased your fluid pill. Please bring your lo with reports of weights.    If you have worsening shortness of breath over the weekend that does not resolve, recommend going to the ER. Try pursed lip breathing when you feel short of breath: take a good deep breath in for 4 counts, hold for 4 counts, then breath out as if you are blowing through a straw.    Continue low salt diet. This helps prevent extra fluid.   Keep your legs elevated when sitting or laying. You may benefit from placing them under a pillow when laying in bed.    Keep your appointment 06/02/19 with Dr. Delana Meyer. He will be able to assist with your lower extremity edema.

## 2019-05-24 NOTE — Progress Notes (Signed)
Cardiology Office Note  Date:  05/26/2019   ID:  Tracy, Mann 02-08-1933, MRN [id number]  PCP:  Tracy Mann., MD   Chief Complaint  Patient presents with  . office visit    LE edema and SOB with exertion-Patient would like to discuss getting oxygen; Meds verbally reviewed with patient.    HPI:  Tracy Mann is a 86 year old woman with a PMH significant for  chronic atrial fibrillation on Coumadin therapy,  diastolic dysfunction, EF 40 to 45% on echo 01/2018  hypertension,  hyperlipidemia,  morbid obesity,   moderate pulmonary hypertension inferior wall myocardial infarction felt to be secondary to a thrombus from atrial fib in October 2007 at [facility],   episodes of chest pain  who presents for followup Of her coronary artery disease and leg edema.  Seen by our clinic May 22, 2019 She was concerned about low oxygen readings 85% when ambulating, low 90s at rest Legs heavy, felt like a rock Was taking Lasix 40 alternating with 82 Was scheduled to see vein and vascular Prior weight 240 now down to 231 She was told to increase Lasix up to 40 daily potassium 20 daily  Presents again today Weight is down slightly, still with fullness in her chest, shortness of breath, massive leg swelling, woody edema  When asked about her sleep apnea, she reports it has "never been a big thing"  In the past has had Urethral irritation with diuresis Takes pyridium daily, sometimes several times a day  U/A positive three wks ago, klebsiella, Treated with ABX Sx resolved Less dysuria now  HBA1C 9.1 on labs 12/2018 HBA1C 8.3 on 05/20/2019  On insulin Tracy Mann  Previously seen by vein and vascular, told to wear TED hose or ACE wraps perhaps she could wear lymphedema compression pump.    EKG personally reviewed by myself on todays visit Shows atrial fibrillation ventricular rate 88 bpm intraventricular conduction delay, no change from previous  EKG  Other past medical history reviewed In follow-up today, she reports that she had an episode of chest pain 05/21/2015 on the left side She went to the emergency room, was admitted. Hospital records reviewed. Cardiac enzymes were unrevealing, EKG unchanged. She had stress Myoview showing old inferior MI with no ischemia consistent with her known anatomy. She did not take nitroglycerin for her symptoms.   PMH:   has a past medical history of Acute myocardial infarction of inferior wall ([place]), Arthritis, Chronic anticoagulation, Chronic atrial fibrillation ([place]), Chronic combined systolic and diastolic CHF (congestive heart failure) ([place]), Essential hypertension, Hyperlipidemia, mixed, Mixed Ischemic/Nonischemic Cardiomyopathy, Nonobstructive coronary artery disease, Obesity, Thyroid disease, Type II diabetes mellitus ([place]), Venous insufficiency, and Vitamin D deficiency.  PSH:    Past Surgical History:  Procedure Laterality Date  . CARDIAC CATHETERIZATION  11/2012   [facility];EF 45-50%  . CHOLECYSTECTOMY  1999  . TUBAL LIGATION  1968  . VESICOVAGINAL FISTULA CLOSURE W/ TAH  1996    Current Outpatient Medications  Medication Sig Dispense Refill  . ACCU-CHEK AVIVA PLUS test strip USE WITH METER TO CHECK GLUCOSE BEFORE MEALS AND AT BEDTIME 200 strip 12  . ezetimibe (ZETIA) 10 MG tablet TAKE 1 TABLET BY MOUTH EVERY DAY 90 tablet 3  . flavoxATE (URISPAS) 100 MG tablet Take 1 tablet (100 mg total) by mouth 2 (two) times daily as needed for bladder spasms. 60 tablet 11  . fluticasone (FLONASE) 50 MCG/ACT nasal spray SPRAY TWO SPRAYS IN EACH NOSTRIL ONCE DAILY  48 mL 3  . furosemide (LASIX) 20 MG tablet Take 1 tablet (20 mg total) by mouth 2 (two) times daily. 180 tablet 3  . HYDROcodone-acetaminophen (NORCO) 10-325 MG tablet Take 1 tablet by mouth every 6 (six) hours as needed. 120 tablet 0  . insulin glargine (LANTUS SOLOSTAR) 100 UNIT/ML Solostar Pen Inject 20 units nightly between 8-10 PM.  Adjust as directed. Max dose is 50 units daily.    . insulin lispro (HUMALOG KWIKPEN) 100 UNIT/ML KwikPen INJECT 10-20 UNITS THREE TIMES DAILY WITH EACH MEAL. 15 mL 12  . Insulin Pen Needle (RELION PEN NEEDLE 31G/8MM) 31G X 8 MM MISC Use with insulin pen three times daily 100 each 12  . levothyroxine (SYNTHROID) 125 MCG tablet TAKE 1 TABLET (125 MCG TOTAL) BY MOUTH DAILY BEFORE BREAKFAST. 90 tablet 4  . lisinopril (ZESTRIL) 2.5 MG tablet TAKE 1 TABLET BY MOUTH EVERYDAY AT BEDTIME 90 tablet 1  . LORazepam (ATIVAN) 1 MG tablet TAKE 1 TABLET BY MOUTH TWICE A DAY AS NEEDED 60 tablet 1  . Magnesium 500 MG CAPS Take 500 mg by mouth daily.     . nitroGLYCERIN (NITROSTAT) 0.4 MG SL tablet Place 1 tablet (0.4 mg total) under the tongue every 5 (five) minutes as needed for chest pain. 25 tablet 3  . pantoprazole (PROTONIX) 20 MG tablet Take 1 tablet (20 mg total) by mouth daily. 30 tablet 6  . phenazopyridine (PYRIDIUM) 200 MG tablet Take 1 tablet (200 mg total) by mouth 3 (three) times daily as needed for pain. 10 tablet 0  . Potassium 99 MG TABS Take 99 mg 3 tablets when taking Lasix.    Marland Kitchen potassium chloride SA (KLOR-CON) 20 MEQ tablet Take 1 tablet (20 mEq total) by mouth daily. 90 tablet 3  . torsemide (DEMADEX) 20 MG tablet Take 40 mg by mouth daily.    Marland Kitchen warfarin (COUMADIN) 3 MG tablet TAKE 1 TABLET BY MOUTH DAILY 90 tablet 4  . Vitamin D, Ergocalciferol, (DRISDOL) 50000 units CAPS capsule Take 50,000 Units by mouth. Going to restart    . warfarin (COUMADIN) 2 MG tablet Take 1 tablet (2 mg total) by mouth daily. 30 tablet 3  . warfarin (COUMADIN) 2.5 MG tablet TAKE 1 TABLET BY MOUTH EVERY DAY 30 tablet 5  . warfarin (COUMADIN) 4 MG tablet Take 1 tablet (4 mg total) by mouth daily. 30 tablet 12   No current facility-administered medications for this visit.    Allergies:   Other; Sulfa antibiotics; Cephalexin; Clarithromycin; Diphenhydramine; Estrogens conjugated; Estrogens, conjugated; Sulfonamide  derivatives; and Nitrofurantoin   Social History:  The patient  reports that she has never smoked. She has never used smokeless tobacco. She reports that she does not drink alcohol or use drugs.   Family History:   family history includes Heart disease in her mother; Thyroid disease in her father.    Review of Systems: Review of Systems  Constitutional: Negative.   HENT: Negative.   Respiratory: Negative.   Cardiovascular: Positive for leg swelling.  Gastrointestinal: Negative.   Musculoskeletal: Positive for joint pain.  Neurological: Negative.   Psychiatric/Behavioral: Negative.   All other systems reviewed and are negative.   PHYSICAL EXAM: VS:  BP 136/82 (BP Location: Right Arm, Patient Position: Sitting, Cuff Size: Large)   Pulse 88   Ht 5\' 5"  (1.651 m)   Wt 231 lb 8 oz (105 kg)   SpO2 91%   BMI 38.52 kg/m  , BMI Body mass index is 38.52 kg/m.  Constitutional:  oriented to person, place, and time. No distress.  HENT:  Head: Grossly normal Eyes:  no discharge. No scleral icterus.  Neck: No JVD, no carotid bruits  Cardiovascular: Regular rate and rhythm, no murmurs appreciated Woody edema below the knees bilaterally Pulmonary/Chest: Clear to auscultation bilaterally, no wheezes or rails Abdominal: Soft.  no distension.  no tenderness.  Musculoskeletal: Normal range of motion Neurological:  normal muscle tone. Coordination normal. No atrophy Skin: Skin warm and dry Psychiatric: normal affect, pleasant   Recent Labs: 08/29/2018: ALT 13; BUN 18; Hemoglobin 13.5; Magnesium 2.1; Platelets 225; Potassium 4.9; Sodium 139; TSH 0.888 05/05/2019: Creatinine, Ser 0.80    Lipid Panel Lab Results  Component Value Date   CHOL 172 01/17/2019   HDL 57 01/17/2019   LDLCALC 94 01/17/2019   TRIG 119 01/17/2019      Wt Readings from Last 3 Encounters:  05/26/19 231 lb 8 oz (105 kg)  05/22/19 240 lb 6 oz (109 kg)  05/07/19 232 lb (105.2 kg)     ASSESSMENT AND  PLAN:   Acute on chronic diastolic CHF Prior bladder and urethral irritation possibly secondary to Klebsiella UTI In the past was taking Pyridium and limiting her diuretic use Continues to have woody edema, chest fullness Baseline weight likely to 20 pounds or less, she is currently 230 Recommend she continue torsemide 40 daily, add metolazone 2.5 every other day Potassium 20 daily, 40 total on days with metolazone We will see her back in 1 week  Mixed hyperlipidemia Continue Zetia Statin intolerance, no changes made  Essential hypertension - Plan: EKG 12-Lead Blood pressure is well controlled on today's visit. No changes made to the medications. Avoid calcium channel blockers given leg swelling  Atherosclerosis of native coronary artery of native heart without angina pectoris Prior inferior MI Echo showing stable ejection fraction 45%  No further ischemic work-up at this time Stable  Mixed Ischemic/Nonischemic Cardiomyopathy - Plan: EKG 12-Lead We will pursue more aggressive diuretic regiment with metolazone as above  Chronic atrial fibrillation (HCC) - Plan: EKG 12-Lead Tolerating warfarin, rate well controlled Rate well controlled  Obstructive sleep apnea Stressed importance of staying on CPAP  Type 2 diabetes mellitus without complication, with long-term current use of insulin (HCC) A1c poorly controlled, referral made to endocrine Likely contributing to her issues detailed above  PVCs Asymptomatic,   Pauses Likely secondary to sleep apnea, not wearing her CPAP Has 4-second pauses sparingly 3 in 2 weeks time when sleeping She feels like the sleep apnea is not a important thing, currently does not wear CPAP This was discussed with her on today's visit   Total encounter time more than 25 minutes  Greater than 50% was spent in counseling and coordination of care with the patient  Follow-up 1 week   Total encounter time more than 25 minutes  Greater than 50% was  spent in counseling and coordination of care with the patient    No orders of the defined types were placed in this encounter.    Signed, Esmond Plants, M.D., Ph.D. 05/26/2019  [place], [place]

## 2019-05-26 ENCOUNTER — Other Ambulatory Visit: Payer: Self-pay

## 2019-05-26 ENCOUNTER — Ambulatory Visit (INDEPENDENT_AMBULATORY_CARE_PROVIDER_SITE_OTHER): Payer: Medicare Other | Admitting: Cardiovascular Disease

## 2019-05-26 ENCOUNTER — Encounter: Payer: Self-pay | Admitting: Cardiovascular Disease

## 2019-05-26 VITALS — BP 136/82 | HR 88 | Ht 65.0 in | Wt 231.5 lb

## 2019-05-26 DIAGNOSIS — R0609 Other forms of dyspnea: Secondary | ICD-10-CM

## 2019-05-26 DIAGNOSIS — R0600 Dyspnea, unspecified: Secondary | ICD-10-CM | POA: Diagnosis not present

## 2019-05-26 DIAGNOSIS — I482 Chronic atrial fibrillation, unspecified: Secondary | ICD-10-CM

## 2019-05-26 DIAGNOSIS — G4733 Obstructive sleep apnea (adult) (pediatric): Secondary | ICD-10-CM

## 2019-05-26 DIAGNOSIS — I5042 Chronic combined systolic (congestive) and diastolic (congestive) heart failure: Secondary | ICD-10-CM | POA: Diagnosis not present

## 2019-05-26 DIAGNOSIS — M7989 Other specified soft tissue disorders: Secondary | ICD-10-CM

## 2019-05-26 DIAGNOSIS — E1159 Type 2 diabetes mellitus with other circulatory complications: Secondary | ICD-10-CM

## 2019-05-26 DIAGNOSIS — Z794 Long term (current) use of insulin: Secondary | ICD-10-CM

## 2019-05-26 MED ORDER — POTASSIUM CHLORIDE CRYS ER 20 MEQ PO TBCR: 20.0000 meq | EXTENDED_RELEASE_TABLET | ORAL | 3 refills | Status: DC

## 2019-05-26 MED ORDER — METOLAZONE 2.5 MG PO TABS: 2.5000 mg | ORAL_TABLET | ORAL | 3 refills | Status: DC

## 2019-05-26 NOTE — Patient Instructions (Addendum)
Medication Instructions:  Take metolazone 2.5 mg every other day  (Tuesday, Thursday, Sat, Monday) Take 30 min before torsemide 40 mg daily   Take potassium daily  On days with metolazone, please take potassium 2 a day  If you need a refill on your cardiac medications before your next appointment, please call your pharmacy.    Lab work: No new labs needed   If you have labs (blood work) drawn today and your tests are completely normal, you will receive your results only by: Marland Kitchen MyChart Message (if you have MyChart) OR . A paper copy in the mail If you have any lab test that is abnormal or we need to change your treatment, we will call you to review the results.   Testing/Procedures: No new testing needed   Follow-Up: At [facility], you and your health needs are our priority.  As part of our continuing mission to provide you with exceptional heart care, we have created designated Provider Care Teams.  These Care Teams include your primary Cardiologist (physician) and Advanced Practice Providers (APPs -  Physician Assistants and Nurse Practitioners) who all work together to provide you with the care you need, when you need it.  . You will need a follow up appointment in 1 week  . Providers on your designated Care Team:   . Murray Hodgkins, NP . Christell Faith, PA-C . Marrianne Mood, PA-C  Any Other Special Instructions Will Be Listed Below (If Applicable).  COVID-19 Vaccine Information can be found at: ShippingScam.co.uk For questions related to vaccine distribution or appointments, please email vaccine@ .com or call [phone].

## 2019-05-27 ENCOUNTER — Ambulatory Visit (INDEPENDENT_AMBULATORY_CARE_PROVIDER_SITE_OTHER): Payer: Medicare Other

## 2019-05-27 DIAGNOSIS — I482 Chronic atrial fibrillation, unspecified: Secondary | ICD-10-CM | POA: Diagnosis not present

## 2019-05-27 LAB — POCT INR
INR: 1.5 — AB (ref 2.0–3.0)
PT: 17.7

## 2019-05-27 NOTE — Patient Instructions (Signed)
Description   Alternate 3 mg and 4 mg  F/U in 2 weeks

## 2019-05-30 ENCOUNTER — Telehealth: Payer: Self-pay | Admitting: Cardiovascular Disease

## 2019-05-30 NOTE — Telephone Encounter (Signed)
Attempted to call the patient. No answer- I left a message to please call back for more information about her previous message.

## 2019-05-30 NOTE — Telephone Encounter (Signed)
Pt c/o medication issue:  1. Name of Medication: metolazone and torsemide   2. How are you currently taking this medication (dosage and times per day)?    3. Are you having a reaction (difficulty breathing--STAT)?  No   4. What is your medication issue?  Patient says this is not working .  It helped on first day .  Patient states it didn't work yesterday but she did not drink anything much .  Please advise .

## 2019-05-30 NOTE — Telephone Encounter (Signed)
Patient may be out of the house this afternoon States it will be ok to leave a detailed message

## 2019-05-31 NOTE — Progress Notes (Signed)
Cardiology Office Note  Date:  06/02/2019   ID:  Tracy, Mann September 27, 1933, MRN [id number]  PCP:  Jerrol Banana., MD   Chief Complaint  Patient presents with  . OTHER    1 wk f/u c/o sob and fluid retention. Pt not taking potassium having trouble swallowing pill. Meds reviewed verbally with pt.    HPI:  Tracy Mann is a 86 year old woman with a PMH significant for  chronic atrial fibrillation on Coumadin therapy,  diastolic dysfunction, EF 40 to 45% on echo 01/2018  hypertension,  hyperlipidemia,  morbid obesity,   moderate pulmonary hypertension inferior wall myocardial infarction felt to be secondary to a thrombus from atrial fib in October 2007 at [facility],   episodes of chest pain  who presents for followup Of her coronary artery disease and leg edema.  Tracy Mann reports that she has had some weight loss over the past week Would recommend she take torsemide 40 daily with occasional metolazone She is only been taking torsemide 20 Numbers indicating 5 pound weight loss in the past week She has been doing more, breathing better, legs are less tight Still with significant leg swelling below the knees in particular, some thigh edema  Overall improved but not at her baseline She has cut back on her intake of fluids She has not been compliant with the potassium, difficulty swallowing the pills  Denies significant dysuria Symptoms resolved after treatment of her UTI  Lab work reviewed HBA1C 9.1 on labs 12/2018 HBA1C 8.3 on 05/20/2019 On insulin, Dr. Gabriel Carina  Previously seen by vein and vascular, told to wear TED hose or ACE wraps perhaps she could wear lymphedema compression pump.    EKG personally reviewed by myself on todays visit Shows atrial fibrillation ventricular rate 89 bpm intraventricular conduction delay, PVC  Other past medical history reviewed In follow-up today, she reports that she had an episode of chest pain 05/21/2015 on the  left side She went to the emergency room, was admitted. Hospital records reviewed. Cardiac enzymes were unrevealing, EKG unchanged. She had stress Myoview showing old inferior MI with no ischemia consistent with her known anatomy. She did not take nitroglycerin for her symptoms.   PMH:   has a past medical history of Acute myocardial infarction of inferior wall ([place]), Arthritis, Chronic anticoagulation, Chronic atrial fibrillation ([place]), Chronic combined systolic and diastolic CHF (congestive heart failure) ([place]), Essential hypertension, Hyperlipidemia, mixed, Mixed Ischemic/Nonischemic Cardiomyopathy, Nonobstructive coronary artery disease, Obesity, Thyroid disease, Type II diabetes mellitus ([place]), Venous insufficiency, and Vitamin D deficiency.  PSH:    Past Surgical History:  Procedure Laterality Date  . CARDIAC CATHETERIZATION  11/2012   [facility];EF 45-50%  . CHOLECYSTECTOMY  1999  . TUBAL LIGATION  1968  . VESICOVAGINAL FISTULA CLOSURE W/ TAH  1996    Current Outpatient Medications  Medication Sig Dispense Refill  . ACCU-CHEK AVIVA PLUS test strip USE WITH METER TO CHECK GLUCOSE BEFORE MEALS AND AT BEDTIME 200 strip 12  . ezetimibe (ZETIA) 10 MG tablet TAKE 1 TABLET BY MOUTH EVERY DAY 90 tablet 3  . flavoxATE (URISPAS) 100 MG tablet Take 1 tablet (100 mg total) by mouth 2 (two) times daily as needed for bladder spasms. 60 tablet 11  . fluticasone (FLONASE) 50 MCG/ACT nasal spray SPRAY TWO SPRAYS IN EACH NOSTRIL ONCE DAILY 48 mL 3  . furosemide (LASIX) 20 MG tablet Take 1 tablet (20 mg total) by mouth 2 (two) times daily. (Patient taking differently: Take  20 mg by mouth daily. ) 180 tablet 3  . HYDROcodone-acetaminophen (NORCO) 10-325 MG tablet Take 1 tablet by mouth every 6 (six) hours as needed. 120 tablet 0  . insulin glargine (LANTUS SOLOSTAR) 100 UNIT/ML Solostar Pen Inject 20 units nightly between 8-10 PM. Adjust as directed. Max dose is 50 units daily.    . insulin lispro (HUMALOG  KWIKPEN) 100 UNIT/ML KwikPen INJECT 10-20 UNITS THREE TIMES DAILY WITH EACH MEAL. 15 mL 12  . Insulin Pen Needle (RELION PEN NEEDLE 31G/8MM) 31G X 8 MM MISC Use with insulin pen three times daily 100 each 12  . levothyroxine (SYNTHROID) 125 MCG tablet TAKE 1 TABLET (125 MCG TOTAL) BY MOUTH DAILY BEFORE BREAKFAST. 90 tablet 4  . lisinopril (ZESTRIL) 2.5 MG tablet TAKE 1 TABLET BY MOUTH EVERYDAY AT BEDTIME 90 tablet 1  . LORazepam (ATIVAN) 1 MG tablet TAKE 1 TABLET BY MOUTH TWICE A DAY AS NEEDED 60 tablet 1  . Magnesium 500 MG CAPS Take 500 mg by mouth daily.     . metolazone (ZAROXOLYN) 2.5 MG tablet Take 1 tablet (2.5 mg total) by mouth every other day. TAKE 30 minutes before Torsemide 45 tablet 3  . nitroGLYCERIN (NITROSTAT) 0.4 MG SL tablet Place 1 tablet (0.4 mg total) under the tongue every 5 (five) minutes as needed for chest pain. 25 tablet 3  . pantoprazole (PROTONIX) 20 MG tablet Take 1 tablet (20 mg total) by mouth daily. 30 tablet 6  . phenazopyridine (PYRIDIUM) 200 MG tablet Take 1 tablet (200 mg total) by mouth 3 (three) times daily as needed for pain. 10 tablet 0  . torsemide (DEMADEX) 20 MG tablet Take 20 mg by mouth daily.     . Vitamin D, Ergocalciferol, (DRISDOL) 50000 units CAPS capsule Take 50,000 Units by mouth. Going to restart    . warfarin (COUMADIN) 2 MG tablet Take 1 tablet (2 mg total) by mouth daily. 30 tablet 3  . warfarin (COUMADIN) 2.5 MG tablet TAKE 1 TABLET BY MOUTH EVERY DAY 30 tablet 5  . warfarin (COUMADIN) 3 MG tablet TAKE 1 TABLET BY MOUTH DAILY 90 tablet 4  . warfarin (COUMADIN) 4 MG tablet Take 1 tablet (4 mg total) by mouth daily. 30 tablet 12  . potassium chloride SA (KLOR-CON) 20 MEQ tablet Take 1 tablet (20 mEq total) by mouth as directed. Take 1 tablet daily (61mEq) and every other day with metolazone take 2 tablets (Patient not taking: Reported on 06/02/2019) 90 tablet 3   No current facility-administered medications for this visit.    Allergies:    Other; Sulfa antibiotics; Cephalexin; Clarithromycin; Diphenhydramine; Estrogens conjugated; Estrogens, conjugated; Sulfonamide derivatives; and Nitrofurantoin   Social History:  The patient  reports that she has never smoked. She has never used smokeless tobacco. She reports that she does not drink alcohol or use drugs.   Family History:   family history includes Heart disease in her mother; Thyroid disease in her father.    Review of Systems: Review of Systems  Constitutional: Negative.   HENT: Negative.   Respiratory: Negative.   Cardiovascular: Positive for leg swelling.  Gastrointestinal: Negative.   Musculoskeletal: Positive for joint pain.  Neurological: Negative.   Psychiatric/Behavioral: Negative.   All other systems reviewed and are negative.   PHYSICAL EXAM: VS:  BP 130/60 (BP Location: Left Arm, Patient Position: Sitting, Cuff Size: Normal)   Pulse 93   Ht 5' 5.5" (1.664 m)   Wt 225 lb (102.1 kg)   SpO2 92%  BMI 36.87 kg/m  , BMI Body mass index is 36.87 kg/m.  Constitutional:  oriented to person, place, and time. No distress.  HENT:  Head: Grossly normal Eyes:  no discharge. No scleral icterus.  Neck: No JVD, no carotid bruits  Cardiovascular: Regular rate and rhythm, no murmurs appreciated 1+ pitting lower extremity edema, woody to below the knees Some trace thigh edema Pulmonary/Chest: Clear to auscultation bilaterally, no wheezes or rails Abdominal: Soft.  no distension.  no tenderness.  Musculoskeletal: Normal range of motion Neurological:  normal muscle tone. Coordination normal. No atrophy Skin: Skin warm and dry Psychiatric: normal affect, pleasant  Recent Labs: 08/29/2018: ALT 13; BUN 18; Hemoglobin 13.5; Magnesium 2.1; Platelets 225; Potassium 4.9; Sodium 139; TSH 0.888 05/05/2019: Creatinine, Ser 0.80    Lipid Panel Lab Results  Component Value Date   CHOL 172 01/17/2019   HDL 57 01/17/2019   LDLCALC 94 01/17/2019   TRIG 119 01/17/2019       Wt Readings from Last 3 Encounters:  06/02/19 225 lb (102.1 kg)  05/26/19 231 lb 8 oz (105 kg)  05/22/19 240 lb 6 oz (109 kg)     ASSESSMENT AND PLAN:   Acute on chronic diastolic CHF Recommend she increase torsemide up to 40 twice daily, metolazone 2-3 times per week Potassium 20 daily, extra potassium 20 when she takes her metolazone Repeat lab work today, BMP  Mixed hyperlipidemia Continue Zetia Statin intolerance, no changes made  Essential hypertension - Plan: EKG 12-Lead Blood pressure is well controlled on today's visit. No changes made to the medications.  Atherosclerosis of native coronary artery of native heart without angina pectoris Prior inferior MI Echo showing stable ejection fraction 45%  Denies anginal symptoms, no further testing  Mixed Ischemic/Nonischemic Cardiomyopathy - Plan: EKG 12-Lead Continue aggressive diuretic regimen as detailed above Weight now trending in the right direction, down  Chronic atrial fibrillation ([place]) - Plan: EKG 12-Lead Tolerating warfarin, rate well controlled Rate well controlled A. fib likely contributing to CHF  Obstructive sleep apnea She reports sleep apnea "not a major issue"  Type 2 diabetes mellitus without complication, with long-term current use of insulin (HCC) A1c poorly controlled, followed by endocrinology Stressed importance of weight loss  PVCs Asymptomatic,  Seen on EKG today    Total encounter time more than 25 minutes  Greater than 50% was spent in counseling and coordination of care with the patient  Follow-up 2 week   Total encounter time more than 25 minutes  Greater than 50% was spent in counseling and coordination of care with the patient    Orders Placed This Encounter  Procedures  . EKG 12-Lead     Signed, Esmond Plants, M.D., Ph.D. 06/02/2019  [facility] [place], [place] [phone]

## 2019-06-02 ENCOUNTER — Encounter: Payer: Self-pay | Admitting: Cardiovascular Disease

## 2019-06-02 ENCOUNTER — Ambulatory Visit (INDEPENDENT_AMBULATORY_CARE_PROVIDER_SITE_OTHER): Payer: Medicare Other | Admitting: Cardiovascular Disease

## 2019-06-02 ENCOUNTER — Encounter (INDEPENDENT_AMBULATORY_CARE_PROVIDER_SITE_OTHER): Payer: Medicare Other | Admitting: Vascular Surgery

## 2019-06-02 ENCOUNTER — Other Ambulatory Visit: Payer: Self-pay

## 2019-06-02 VITALS — BP 130/60 | HR 93 | Ht 65.5 in | Wt 225.0 lb

## 2019-06-02 DIAGNOSIS — R0600 Dyspnea, unspecified: Secondary | ICD-10-CM

## 2019-06-02 DIAGNOSIS — E785 Hyperlipidemia, unspecified: Secondary | ICD-10-CM

## 2019-06-02 DIAGNOSIS — M7989 Other specified soft tissue disorders: Secondary | ICD-10-CM | POA: Diagnosis not present

## 2019-06-02 DIAGNOSIS — I25118 Atherosclerotic heart disease of native coronary artery with other forms of angina pectoris: Secondary | ICD-10-CM

## 2019-06-02 DIAGNOSIS — I482 Chronic atrial fibrillation, unspecified: Secondary | ICD-10-CM | POA: Diagnosis not present

## 2019-06-02 DIAGNOSIS — Z794 Long term (current) use of insulin: Secondary | ICD-10-CM | POA: Diagnosis not present

## 2019-06-02 DIAGNOSIS — R0609 Other forms of dyspnea: Secondary | ICD-10-CM

## 2019-06-02 DIAGNOSIS — G4733 Obstructive sleep apnea (adult) (pediatric): Secondary | ICD-10-CM

## 2019-06-02 DIAGNOSIS — E1159 Type 2 diabetes mellitus with other circulatory complications: Secondary | ICD-10-CM

## 2019-06-02 DIAGNOSIS — I5042 Chronic combined systolic (congestive) and diastolic (congestive) heart failure: Secondary | ICD-10-CM | POA: Diagnosis not present

## 2019-06-02 MED ORDER — METOLAZONE 2.5 MG PO TABS: 2.5000 mg | ORAL_TABLET | ORAL | 3 refills | Status: DC

## 2019-06-02 MED ORDER — TORSEMIDE 20 MG PO TABS: 40.0000 mg | ORAL_TABLET | Freq: Every day | ORAL | 3 refills | Status: DC

## 2019-06-02 NOTE — Telephone Encounter (Signed)
Spoke with patient and reviewed that she can discuss her concerns with Dr. Rockey Situ today at her upcoming appointment. She verbalized understanding with no further questions at this time.

## 2019-06-02 NOTE — Patient Instructions (Addendum)
Medication Instructions:  Your physician has recommended you make the following change in your medication:  1. TAKE Metolazone 2.5 mg in the AM, 30 minutes before torsemide, three times a week (Monday/Wednesday/Friday) 2. TAKE Torsemide 20 mg and take 2 tablets (40 mg) in the AM 3. SOAK Potassium pill then swallow  If you need a refill on your cardiac medications before your next appointment, please call your pharmacy.    Lab work: [employer] today   If you have labs (blood work) drawn today and your tests are completely normal, you will receive your results only by: Marland Kitchen MyChart Message (if you have MyChart) OR . A paper copy in the mail If you have any lab test that is abnormal or we need to change your treatment, we will call you to review the results.   Testing/Procedures: No new testing needed   Follow-Up: At [facility], you and your health needs are our priority.  As part of our continuing mission to provide you with exceptional heart care, we have created designated Provider Care Teams.  These Care Teams include your primary Cardiologist (physician) and Advanced Practice Providers (APPs -  Physician Assistants and Nurse Practitioners) who all work together to provide you with the care you need, when you need it.  . You will need a follow up appointment in 2 weeks  . Providers on your designated Care Team:   . Murray Hodgkins, NP . Christell Faith, PA-C . Marrianne Mood, PA-C  Any Other Special Instructions Will Be Listed Below (If Applicable).  For educational health videos Log in to : [email or web address] Or : [email or web address], password : [email or web address]

## 2019-06-03 LAB — BASIC METABOLIC PANEL
BUN/Creatinine Ratio: 33 — ABNORMAL HIGH (ref 12–28)
BUN: 32 mg/dL — ABNORMAL HIGH (ref 8–27)
CO2: 25 mmol/L (ref 20–29)
Calcium: 9.5 mg/dL (ref 8.7–10.3)
Chloride: 97 mmol/L (ref 96–106)
Creatinine, Ser: 0.98 mg/dL (ref 0.57–1.00)
GFR calc Af Amer: 61 mL/min/{1.73_m2} (ref >59)
GFR calc non Af Amer: 53 mL/min/{1.73_m2} — ABNORMAL LOW (ref >59)
Glucose: 216 mg/dL — ABNORMAL HIGH (ref 65–99)
Potassium: 4.4 mmol/L (ref 3.5–5.2)
Sodium: 139 mmol/L (ref 134–144)

## 2019-06-04 ENCOUNTER — Ambulatory Visit: Payer: Self-pay | Admitting: Urology

## 2019-06-06 ENCOUNTER — Other Ambulatory Visit: Payer: Self-pay

## 2019-06-06 ENCOUNTER — Telehealth: Payer: Self-pay

## 2019-06-06 ENCOUNTER — Ambulatory Visit (INDEPENDENT_AMBULATORY_CARE_PROVIDER_SITE_OTHER): Payer: Medicare Other | Admitting: Physician Assistant

## 2019-06-06 VITALS — BP 144/63 | HR 69 | Temp 98.2°F | Wt 224.0 lb

## 2019-06-06 DIAGNOSIS — N3090 Cystitis, unspecified without hematuria: Secondary | ICD-10-CM | POA: Diagnosis not present

## 2019-06-06 MED ORDER — LEVOFLOXACIN 250 MG PO TABS: 250.0000 mg | ORAL_TABLET | Freq: Every day | ORAL | 0 refills | Status: DC

## 2019-06-06 NOTE — Progress Notes (Signed)
Established patient visit   Patient: Tracy Mann   DOB: 1933/08/13   86 y.o. Female  MRN: [id number] Visit Date: 06/06/2019  Today's healthcare provider: Trinna Post, PA-C   Chief Complaint  Patient presents with  . Urinary Tract Infection   Subjective    Urinary Tract Infection  This is a new problem. The current episode started today. The problem has been gradually worsening. There has been no fever. Associated symptoms include frequency. Pertinent negatives include no hematuria.   Patient has recurrent cystitis vs interstitial cystitis. Followed by [facility].    Patient Active Problem List   Diagnosis Date Noted  . Angina pectoris ([place]) 06/07/2015  . Angina at rest [facility]) 05/20/2015  . Fatigue 03/03/2015  . Mixed Ischemic/Nonischemic Cardiomyopathy   . Chronic atrial fibrillation ([place])   . Chronic anticoagulation   . Airway hyperreactivity 07/08/2014  . Cervical muscle strain 07/08/2014  . PNA (pneumonia) 07/08/2014  . Chest pressure 07/08/2014  . Clinical depression 07/08/2014  . Degeneration of lumbar or lumbosacral intervertebral disc 07/08/2014  . Accumulation of fluid in tissues 07/08/2014  . Anxiety, generalized 07/08/2014  . Acid reflux 07/08/2014  . Folliculitis 99991111  . Broken leg 07/08/2014  . Cannot sleep 07/08/2014  . Obstructive sleep apnea 07/08/2014  . Arthritis, degenerative 07/08/2014  . Onychia of finger 07/08/2014  . Psoriasis 07/08/2014  . Restless legs syndrome 07/08/2014  . Adult BMI 30+ 07/08/2014  . Hypothyroidism 09/04/2013  . Type 2 diabetes mellitus without complications ([place]) 99991111  . Calculus of kidney 10/31/2011  . Bladder infection, chronic 10/31/2011  . Incomplete bladder emptying 10/31/2011  . Obesity 11/02/2010  . Dyspnea on exertion 02/01/2010  . Chronic diastolic heart failure ([place]) 02/08/2009  . Hyperlipidemia 09/16/2008  . CAD, NATIVE VESSEL 09/16/2008  . Atrial fibrillation  ([place]) 09/16/2008   Social History   Tobacco Use  . Smoking status: Never Smoker  . Smokeless tobacco: Never Used  Substance Use Topics  . Alcohol use: No  . Drug use: No   Allergies  Allergen Reactions  . Other Anaphylaxis and Swelling    'tongue swelling'  . Sulfa Antibiotics Anaphylaxis  . Cephalexin     Blisters  . Clarithromycin Other (See Comments)    Hives, headaches, hard time swelling, felt like throat was closing up Hives, headaches, hard time swelling, felt like throat was closing up  . Diphenhydramine Other (See Comments)  . Estrogens Conjugated   . Estrogens, Conjugated Other (See Comments)  . Sulfonamide Derivatives Swelling    'tongue swelling'  . Nitrofurantoin Nausea Only     Medications: Outpatient Medications Prior to Visit  Medication Sig  . ACCU-CHEK AVIVA PLUS test strip USE WITH METER TO CHECK GLUCOSE BEFORE MEALS AND AT BEDTIME  . ezetimibe (ZETIA) 10 MG tablet TAKE 1 TABLET BY MOUTH EVERY DAY  . flavoxATE (URISPAS) 100 MG tablet Take 1 tablet (100 mg total) by mouth 2 (two) times daily as needed for bladder spasms.  . fluticasone (FLONASE) 50 MCG/ACT nasal spray SPRAY TWO SPRAYS IN EACH NOSTRIL ONCE DAILY  . furosemide (LASIX) 20 MG tablet Take 1 tablet (20 mg total) by mouth 2 (two) times daily. (Patient taking differently: Take 20 mg by mouth daily. )  . HYDROcodone-acetaminophen (NORCO) 10-325 MG tablet Take 1 tablet by mouth every 6 (six) hours as needed.  . insulin glargine (LANTUS SOLOSTAR) 100 UNIT/ML Solostar Pen Inject 20 units nightly between 8-10 PM. Adjust as directed. Max dose is  50 units daily.  . insulin lispro (HUMALOG KWIKPEN) 100 UNIT/ML KwikPen INJECT 10-20 UNITS THREE TIMES DAILY WITH EACH MEAL.  Marland Kitchen Insulin Pen Needle (RELION PEN NEEDLE 31G/8MM) 31G X 8 MM MISC Use with insulin pen three times daily  . levothyroxine (SYNTHROID) 125 MCG tablet TAKE 1 TABLET (125 MCG TOTAL) BY MOUTH DAILY BEFORE BREAKFAST.  Marland Kitchen lisinopril (ZESTRIL) 2.5  MG tablet TAKE 1 TABLET BY MOUTH EVERYDAY AT BEDTIME  . LORazepam (ATIVAN) 1 MG tablet TAKE 1 TABLET BY MOUTH TWICE A DAY AS NEEDED  . Magnesium 500 MG CAPS Take 500 mg by mouth daily.   . metolazone (ZAROXOLYN) 2.5 MG tablet Take 1 tablet (2.5 mg total) by mouth 3 (three) times a week. TAKE 30 minutes before Torsemide  . nitroGLYCERIN (NITROSTAT) 0.4 MG SL tablet Place 1 tablet (0.4 mg total) under the tongue every 5 (five) minutes as needed for chest pain.  . pantoprazole (PROTONIX) 20 MG tablet Take 1 tablet (20 mg total) by mouth daily.  . phenazopyridine (PYRIDIUM) 200 MG tablet Take 1 tablet (200 mg total) by mouth 3 (three) times daily as needed for pain.  . potassium chloride SA (KLOR-CON) 20 MEQ tablet Take 1 tablet (20 mEq total) by mouth as directed. Take 1 tablet daily (42mEq) and every other day with metolazone take 2 tablets  . torsemide (DEMADEX) 20 MG tablet Take 2 tablets (40 mg total) by mouth daily.  . Vitamin D, Ergocalciferol, (DRISDOL) 50000 units CAPS capsule Take 50,000 Units by mouth. Going to restart  . warfarin (COUMADIN) 2 MG tablet Take 1 tablet (2 mg total) by mouth daily.  Marland Kitchen warfarin (COUMADIN) 2.5 MG tablet TAKE 1 TABLET BY MOUTH EVERY DAY  . warfarin (COUMADIN) 3 MG tablet TAKE 1 TABLET BY MOUTH DAILY  . warfarin (COUMADIN) 4 MG tablet Take 1 tablet (4 mg total) by mouth daily.   No facility-administered medications prior to visit.    Review of Systems  Constitutional: Negative.   Genitourinary: Positive for difficulty urinating, dysuria and frequency. Negative for hematuria, vaginal bleeding, vaginal discharge and vaginal pain.  Neurological: Negative for dizziness, light-headedness and headaches.      Objective    BP (!) 144/63 (BP Location: Right Arm, Patient Position: Sitting, Cuff Size: Large)   Pulse 69   Temp 98.2 F (36.8 C) (Temporal)   Wt 224 lb (101.6 kg)   BMI 36.71 kg/m    Physical Exam Constitutional:      Appearance: Normal  appearance.  Eyes:     Conjunctiva/sclera: Conjunctivae normal.  Cardiovascular:     Rate and Rhythm: Normal rate and regular rhythm.     Pulses: Normal pulses.     Heart sounds: Normal heart sounds.  Pulmonary:     Effort: Pulmonary effort is normal.     Breath sounds: Normal breath sounds.  Skin:    General: Skin is warm and dry.  Neurological:     Mental Status: She is alert and oriented to person, place, and time. Mental status is at baseline.  Psychiatric:        Mood and Affect: Mood normal.        Behavior: Behavior normal.        Thought Content: Thought content normal.        Judgment: Judgment normal.       No results found for any visits on 06/06/19.  Assessment & Plan    1. Recurrent cystitis  Levaquin 250 mg QD x 7 days at  patient's request. Counseled on risks of broad spectrum abx repeatedly and their contribution to MDR organisms. Patient is insistent. Patient wants another Rx sent in after this one. Recommended she will need evaluation for symptoms. She needs to f/u w/ urology. She is scheduled with PCP on 06/17/2019 who may recheck her urine.   - CULTURE, URINE COMPREHENSIVE    Return in about 2 weeks (around 06/20/2019).      ITrinna Post, PA-C, have reviewed all documentation for this visit. The documentation on 06/06/19 for the exam, diagnosis, procedures, and orders are all accurate and complete.    Paulene Floor  [facility] [phone] (phone) [phone] (fax)  [place]

## 2019-06-06 NOTE — Patient Instructions (Signed)
Urinary Tract Infection, Adult A urinary tract infection (UTI) is an infection of any part of the urinary tract. The urinary tract includes:  The kidneys.  The ureters.  The bladder.  The urethra. These organs make, store, and get rid of pee (urine) in the body. What are the causes? This is caused by germs (bacteria) in your genital area. These germs grow and cause swelling (inflammation) of your urinary tract. What increases the risk? You are more likely to develop this condition if:  You have a small, thin tube (catheter) to drain pee.  You cannot control when you pee or poop (incontinence).  You are female, and: ? You use these methods to prevent pregnancy:  A medicine that kills sperm (spermicide).  A device that blocks sperm (diaphragm). ? You have low levels of a female hormone (estrogen). ? You are pregnant.  You have genes that add to your risk.  You are sexually active.  You take antibiotic medicines.  You have trouble peeing because of: ? A prostate that is bigger than normal, if you are female. ? A blockage in the part of your body that drains pee from the bladder (urethra). ? A kidney stone. ? A nerve condition that affects your bladder (neurogenic bladder). ? Not getting enough to drink. ? Not peeing often enough.  You have other conditions, such as: ? Diabetes. ? A weak disease-fighting system (immune system). ? Sickle cell disease. ? Gout. ? Injury of the spine. What are the signs or symptoms? Symptoms of this condition include:  Needing to pee right away (urgently).  Peeing often.  Peeing small amounts often.  Pain or burning when peeing.  Blood in the pee.  Pee that smells bad or not like normal.  Trouble peeing.  Pee that is cloudy.  Fluid coming from the vagina, if you are female.  Pain in the belly or lower back. Other symptoms include:  Throwing up (vomiting).  No urge to eat.  Feeling mixed up (confused).  Being tired  and grouchy (irritable).  A fever.  Watery poop (diarrhea). How is this treated? This condition may be treated with:  Antibiotic medicine.  Other medicines.  Drinking enough water. Follow these instructions at home:  Medicines  Take over-the-counter and prescription medicines only as told by your doctor.  If you were prescribed an antibiotic medicine, take it as told by your doctor. Do not stop taking it even if you start to feel better. General instructions  Make sure you: ? Pee until your bladder is empty. ? Do not hold pee for a long time. ? Empty your bladder after sex. ? Wipe from front to back after pooping if you are a female. Use each tissue one time when you wipe.  Drink enough fluid to keep your pee pale yellow.  Keep all follow-up visits as told by your doctor. This is important. Contact a doctor if:  You do not get better after 1-2 days.  Your symptoms go away and then come back. Get help right away if:  You have very bad back pain.  You have very bad pain in your lower belly.  You have a fever.  You are sick to your stomach (nauseous).  You are throwing up. Summary  A urinary tract infection (UTI) is an infection of any part of the urinary tract.  This condition is caused by germs in your genital area.  There are many risk factors for a UTI. These include having a small, thin   tube to drain pee and not being able to control when you pee or poop.  Treatment includes antibiotic medicines for germs.  Drink enough fluid to keep your pee pale yellow. This information is not intended to replace advice given to you by your health care provider. Make sure you discuss any questions you have with your health care provider. Document Revised: 01/03/2018 Document Reviewed: 07/26/2017 Elsevier Patient Education  2020 Elsevier Inc.  

## 2019-06-06 NOTE — Telephone Encounter (Signed)
Patient called complaining of dysuria and nausea. Offered patient an appointment for today, or to just drop off a UA. Patient states she is too sick to come in right now and declined. Stated she will call later if feeling better.

## 2019-06-09 ENCOUNTER — Other Ambulatory Visit: Payer: Self-pay | Admitting: Family Medicine

## 2019-06-09 NOTE — Telephone Encounter (Signed)
Requested medication (s) are due for refill today: yes  Requested medication (s) are on the active medication list: yes  Last refill:  05/16/19  Future visit scheduled: yes  Notes to clinic: not delegated    Requested Prescriptions  Pending Prescriptions Disp Refills   LORazepam (ATIVAN) 1 MG tablet [Pharmacy Med Name: LORAZEPAM 1 MG TABLET] 60 tablet 1    Sig: TAKE 1 TABLET BY MOUTH TWICE A DAY AS NEEDED      Not Delegated - Psychiatry:  Anxiolytics/Hypnotics Failed - 06/09/2019 12:42 PM      Failed - This refill cannot be delegated      Failed - Urine Drug Screen completed in last 360 days.      Passed - Valid encounter within last 6 months    Recent Outpatient Visits           3 days ago Recurrent cystitis   [facility] Trinna Post, [place]   1 month ago [place], Vickki Muff, [place]   2 months ago Yeast infection   [facility] Jerrol Banana., MD   2 months ago Candidal skin infection   [place], [place], [place]   2 months ago [place] Jerrol Banana., MD       Future Appointments             In 1 week Gilford Rile, Martie Lee, NP [facility], [facility]   In 1 week Jerrol Banana., MD [facility], [place]   In 1 month [name], Kathlene November, MD [facility], [place]

## 2019-06-10 ENCOUNTER — Ambulatory Visit (INDEPENDENT_AMBULATORY_CARE_PROVIDER_SITE_OTHER): Payer: Medicare Other

## 2019-06-10 ENCOUNTER — Other Ambulatory Visit: Payer: Self-pay

## 2019-06-10 DIAGNOSIS — I482 Chronic atrial fibrillation, unspecified: Secondary | ICD-10-CM

## 2019-06-10 LAB — POCT INR
INR: 2.1 (ref 2.0–3.0)
PT: 25.2

## 2019-06-10 NOTE — Patient Instructions (Signed)
Continue to alternate 3mg  and 4mg . Recheck in four weeks.

## 2019-06-11 ENCOUNTER — Ambulatory Visit: Payer: Self-pay | Admitting: Urology

## 2019-06-11 LAB — CULTURE, URINE COMPREHENSIVE

## 2019-06-12 ENCOUNTER — Other Ambulatory Visit: Payer: Self-pay

## 2019-06-12 ENCOUNTER — Encounter (INDEPENDENT_AMBULATORY_CARE_PROVIDER_SITE_OTHER): Payer: Self-pay | Admitting: Vascular Surgery

## 2019-06-12 ENCOUNTER — Ambulatory Visit (INDEPENDENT_AMBULATORY_CARE_PROVIDER_SITE_OTHER): Payer: Medicare Other | Admitting: Vascular Surgery

## 2019-06-12 VITALS — BP 135/82 | HR 92 | Resp 16 | Ht 65.5 in | Wt 223.4 lb

## 2019-06-12 DIAGNOSIS — I25118 Atherosclerotic heart disease of native coronary artery with other forms of angina pectoris: Secondary | ICD-10-CM | POA: Diagnosis not present

## 2019-06-12 DIAGNOSIS — I890 Lymphedema, not elsewhere classified: Secondary | ICD-10-CM | POA: Diagnosis not present

## 2019-06-12 DIAGNOSIS — I5032 Chronic diastolic (congestive) heart failure: Secondary | ICD-10-CM

## 2019-06-12 DIAGNOSIS — I482 Chronic atrial fibrillation, unspecified: Secondary | ICD-10-CM | POA: Diagnosis not present

## 2019-06-12 DIAGNOSIS — E782 Mixed hyperlipidemia: Secondary | ICD-10-CM | POA: Diagnosis not present

## 2019-06-13 NOTE — Progress Notes (Signed)
Trena Platt Cummings,acting as a scribe for Wilhemena Durie, MD.,have documented all relevant documentation on the behalf of Wilhemena Durie, MD,as directed by  Wilhemena Durie, MD while in the presence of Wilhemena Durie, MD.  Established patient visit   Patient: Tracy Mann   DOB: 12-Jan-1934   86 y.o. Female  MRN: [id number] Visit Date: 06/17/2019  Today's healthcare provider: Wilhemena Durie, MD   Chief Complaint  Patient presents with  . Diabetes Mellitus   Subjective    HPI  Patient has appointment for follow-up on diabetes with Dr. Mickie Kay from endocrinology.  She states she takes her medications but I am not sure she is compliant with any lifestyle changes. Diabetes Mellitus Type II, follow-up  Lab Results  Component Value Date   HGBA1C 9.1 (H) 01/17/2019   HGBA1C 8.7 (A) 08/13/2018   HGBA1C 8.4 (A) 04/10/2018   Last seen for diabetes 5 months ago.  Management since then includes; labs checked showing-stable but diabetes not well controlled. Advised to increase insulin by 2 units daily. She reports excellent compliance with treatment. She is not having side effects.   Home blood sugar records: fasting range: 150  Episodes of hypoglycemia? No    Current insulin regiment: Lantus Solostar 20 units nightly and Humalog 10-20 units 3x daily  Most Recent Eye Exam: hasn't had one recently   ---------------------------------------------------------------------------------------------------  Primary osteoarthritis involving multiple joints From 12/02/2018-On her next visit we will begin to cut down on the number of pills per prescription by 10 per prescription. This is something that we need to do for this 86 year old.  Recurrent cystitis From 06/06/2019-seen by Carles Collet. Given rx for Levaquin 250 mg QD x 7 days at patient's request. Counseled on risks of broad spectrum abx repeatedly and their contribution to MDR organisms. Patient is insistent.  Patient wants another Rx sent in after this one. Recommended she will need evaluation for symptoms. She needs to f/u w/ urology. She is scheduled with PCP on 06/17/2019 who may recheck her urine.  She is getting ready to go out of town and is scared she will get another infection. She does not want to go back to urology.  Exam revealed possible renal cell carcinoma and CT done which was negative for any cancer.     Medications: Outpatient Medications Prior to Visit  Medication Sig  . ACCU-CHEK AVIVA PLUS test strip USE WITH METER TO CHECK GLUCOSE BEFORE MEALS AND AT BEDTIME  . ezetimibe (ZETIA) 10 MG tablet TAKE 1 TABLET BY MOUTH EVERY DAY  . flavoxATE (URISPAS) 100 MG tablet Take 1 tablet (100 mg total) by mouth 2 (two) times daily as needed for bladder spasms.  . fluticasone (FLONASE) 50 MCG/ACT nasal spray SPRAY TWO SPRAYS IN EACH NOSTRIL ONCE DAILY  . furosemide (LASIX) 20 MG tablet Take 1 tablet (20 mg total) by mouth 2 (two) times daily. (Patient taking differently: Take 20 mg by mouth daily. )  . HYDROcodone-acetaminophen (NORCO) 10-325 MG tablet Take 1 tablet by mouth every 6 (six) hours as needed.  . insulin glargine (LANTUS SOLOSTAR) 100 UNIT/ML Solostar Pen Inject 20 units nightly between 8-10 PM. Adjust as directed. Max dose is 50 units daily.  . insulin lispro (HUMALOG KWIKPEN) 100 UNIT/ML KwikPen INJECT 10-20 UNITS THREE TIMES DAILY WITH EACH MEAL.  Marland Kitchen Insulin Pen Needle (RELION PEN NEEDLE 31G/8MM) 31G X 8 MM MISC Use with insulin pen three times daily  . levofloxacin (LEVAQUIN) 250 MG tablet  Take 1 tablet (250 mg total) by mouth daily.  Marland Kitchen levothyroxine (SYNTHROID) 125 MCG tablet TAKE 1 TABLET (125 MCG TOTAL) BY MOUTH DAILY BEFORE BREAKFAST.  Marland Kitchen lisinopril (ZESTRIL) 2.5 MG tablet TAKE 1 TABLET BY MOUTH EVERYDAY AT BEDTIME  . LORazepam (ATIVAN) 1 MG tablet TAKE 1 TABLET BY MOUTH TWICE A DAY AS NEEDED  . Magnesium 500 MG CAPS Take 500 mg by mouth daily.   . metolazone (ZAROXOLYN) 2.5  MG tablet Take 1 tablet (2.5 mg total) by mouth 3 (three) times a week. TAKE 30 minutes before Torsemide  . nitroGLYCERIN (NITROSTAT) 0.4 MG SL tablet Place 1 tablet (0.4 mg total) under the tongue every 5 (five) minutes as needed for chest pain.  . pantoprazole (PROTONIX) 20 MG tablet Take 1 tablet (20 mg total) by mouth daily.  . phenazopyridine (PYRIDIUM) 200 MG tablet Take 1 tablet (200 mg total) by mouth 3 (three) times daily as needed for pain.  . potassium chloride SA (KLOR-CON) 20 MEQ tablet Take 1 tablet (20 mEq total) by mouth as directed. Take 1 tablet daily (28mEq) and every other day with metolazone take 2 tablets  . torsemide (DEMADEX) 20 MG tablet Take 2 tablets (40 mg total) by mouth daily.  . Vitamin D, Ergocalciferol, (DRISDOL) 50000 units CAPS capsule Take 50,000 Units by mouth. Going to restart  . warfarin (COUMADIN) 2 MG tablet Take 1 tablet (2 mg total) by mouth daily.  Marland Kitchen warfarin (COUMADIN) 2.5 MG tablet TAKE 1 TABLET BY MOUTH EVERY DAY  . warfarin (COUMADIN) 3 MG tablet TAKE 1 TABLET BY MOUTH DAILY  . warfarin (COUMADIN) 4 MG tablet Take 1 tablet (4 mg total) by mouth daily.   No facility-administered medications prior to visit.    Review of Systems  Constitutional: Negative for appetite change, chills, fatigue and fever.  HENT: Negative.   Eyes: Negative.   Respiratory: Negative for chest tightness and shortness of breath.   Cardiovascular: Negative for chest pain and palpitations.  Gastrointestinal: Negative for abdominal pain, nausea and vomiting.  Endocrine: Negative.   Genitourinary:       See HPI.  Patient asymptomatic presently.  Musculoskeletal: Positive for arthralgias and back pain.  Skin: Negative.   Allergic/Immunologic: Negative.   Neurological: Negative for dizziness and weakness.  Hematological: Negative.   Psychiatric/Behavioral: The patient is nervous/anxious.     Last hemoglobin A1c Lab Results  Component Value Date   HGBA1C 7.8 (A)  06/17/2019    Objective    BP 115/73 (BP Location: Left Arm, Patient Position: Sitting, Cuff Size: Large)   Pulse 84   Temp (!) 96.9 F (36.1 C) (Temporal)   Ht 5\' 6"  (1.676 m)   Wt 225 lb 12.8 oz (102.4 kg)   BMI 36.45 kg/m  BP Readings from Last 3 Encounters:  06/17/19 115/73  06/12/19 135/82  06/06/19 (!) 144/63   Wt Readings from Last 3 Encounters:  06/17/19 225 lb 12.8 oz (102.4 kg)  06/12/19 223 lb 6.4 oz (101.3 kg)  06/06/19 224 lb (101.6 kg)      Physical Exam Vitals reviewed.  Constitutional:      General: She is not in acute distress.    Appearance: She is well-developed. She is obese.  HENT:     Head: Normocephalic and atraumatic.     Right Ear: Hearing and external ear normal.     Left Ear: Hearing and external ear normal.     Nose: Nose normal.  Eyes:     General: Lids  are normal. No scleral icterus.       Right eye: No discharge.        Left eye: No discharge.     Conjunctiva/sclera: Conjunctivae normal.  Neck:     Thyroid: No thyromegaly.  Cardiovascular:     Rate and Rhythm: Normal rate and regular rhythm.     Heart sounds: Normal heart sounds.  Pulmonary:     Effort: Pulmonary effort is normal. No respiratory distress.  Abdominal:     Palpations: Abdomen is soft.     Tenderness: There is no right CVA tenderness or left CVA tenderness.  Musculoskeletal:        General: Normal range of motion.     Comments: 1+ LE  edema.  Skin:    General: Skin is warm and dry.     Findings: No lesion.  Neurological:     General: No focal deficit present.     Mental Status: She is alert and oriented to person, place, and time. Mental status is at baseline.  Psychiatric:        Mood and Affect: Mood normal.        Speech: Speech normal.        Behavior: Behavior normal.        Thought Content: Thought content normal.        Judgment: Judgment normal.       No results found for any visits on 06/17/19.  Assessment & Plan     1. Type 2 diabetes  mellitus without complication, without long-term current use of insulin [facility]) Patient has an appointment with endocrinology.  A1c is down to 7.8 today after being 9.1 on last check.  That she could potentially benefit from [place] if she could afford it and would take it. - POCT HgB A1C  2. Recurrent cystitis Patient advised she needs to go back to urology.  We will treat her with 1 refill on the Levaquin for her trip this week.  Not comfortable her having refills of her broad-spectrum alone for simple cystitis. - POCT urinalysis dipstick - Urine Culture - levofloxacin (LEVAQUIN) 250 MG tablet; Take 1 tablet (250 mg total) by mouth daily.  Dispense: 5 tablet; Refill: 1  3. Chronic diastolic heart failure ([place]) Consider Farxiga clinically she is stable presently.  4. Chronic atrial fibrillation ([facility]) On Coumadin  5. Obstructive sleep apnea   6. Bladder infection, chronic   7. Anxiety, generalized Chronic ongoing problem.  8. Recurrent major depressive disorder, in partial remission ([place])  9.  Chronic pain Patient says she cannot live without her narcotics.  Start slowly cutting back on this.  For her fall risk is too high at 86 years old.   No follow-ups on file.      I, Wilhemena Durie, MD, have reviewed all documentation for this visit. The documentation on 06/21/19 for the exam, diagnosis, procedures, and orders are all accurate and complete.    Richard Cranford Mon, MD  [facility] [phone] (phone) [phone] (fax)  [place]

## 2019-06-16 ENCOUNTER — Ambulatory Visit: Payer: Medicare Other | Admitting: Family

## 2019-06-17 ENCOUNTER — Other Ambulatory Visit: Payer: Self-pay

## 2019-06-17 ENCOUNTER — Encounter: Payer: Self-pay | Admitting: Family Medicine

## 2019-06-17 ENCOUNTER — Ambulatory Visit (INDEPENDENT_AMBULATORY_CARE_PROVIDER_SITE_OTHER): Payer: Medicare Other | Admitting: Family Medicine

## 2019-06-17 VITALS — BP 115/73 | HR 84 | Temp 96.9°F | Ht 66.0 in | Wt 225.8 lb

## 2019-06-17 DIAGNOSIS — N3020 Other chronic cystitis without hematuria: Secondary | ICD-10-CM

## 2019-06-17 DIAGNOSIS — E119 Type 2 diabetes mellitus without complications: Secondary | ICD-10-CM | POA: Diagnosis not present

## 2019-06-17 DIAGNOSIS — F411 Generalized anxiety disorder: Secondary | ICD-10-CM

## 2019-06-17 DIAGNOSIS — I482 Chronic atrial fibrillation, unspecified: Secondary | ICD-10-CM

## 2019-06-17 DIAGNOSIS — I5032 Chronic diastolic (congestive) heart failure: Secondary | ICD-10-CM

## 2019-06-17 DIAGNOSIS — G4733 Obstructive sleep apnea (adult) (pediatric): Secondary | ICD-10-CM | POA: Diagnosis not present

## 2019-06-17 DIAGNOSIS — N3090 Cystitis, unspecified without hematuria: Secondary | ICD-10-CM

## 2019-06-17 DIAGNOSIS — F3341 Major depressive disorder, recurrent, in partial remission: Secondary | ICD-10-CM

## 2019-06-17 LAB — POCT URINALYSIS DIPSTICK
Bilirubin, UA: NEGATIVE
Glucose, UA: NEGATIVE
Ketones, UA: NEGATIVE
Nitrite, UA: NEGATIVE
Protein, UA: NEGATIVE
Spec Grav, UA: 1.015 (ref 1.010–1.025)
Urobilinogen, UA: 0.2 E.U./dL
pH, UA: 7 (ref 5.0–8.0)

## 2019-06-17 LAB — POCT GLYCOSYLATED HEMOGLOBIN (HGB A1C)
Estimated Average Glucose: 177
Hemoglobin A1C: 7.8 % — AB (ref 4.0–5.6)

## 2019-06-17 MED ORDER — LEVOFLOXACIN 250 MG PO TABS: 250.0000 mg | ORAL_TABLET | Freq: Every day | ORAL | 1 refills | Status: DC

## 2019-06-18 ENCOUNTER — Telehealth: Payer: Self-pay

## 2019-06-18 ENCOUNTER — Other Ambulatory Visit: Payer: Self-pay | Admitting: Family Medicine

## 2019-06-18 DIAGNOSIS — M159 Polyosteoarthritis, unspecified: Secondary | ICD-10-CM

## 2019-06-18 MED ORDER — HYDROCODONE-ACETAMINOPHEN 10-325 MG PO TABS: 1.0000 | ORAL_TABLET | Freq: Four times a day (QID) | ORAL | 0 refills | Status: DC | PRN

## 2019-06-18 NOTE — Telephone Encounter (Signed)
See earlier message from today.  

## 2019-06-18 NOTE — Telephone Encounter (Signed)
Copied from [place] [phone]. Topic: General - Other >> Jun 18, 2019 10:48 AM Rainey Pines A wrote: Patient called to let Hassan Rowan know that she did not hang up on her but that some how they got disconnected. However, patient stated that she told Hassan Rowan all she needed to. Please advise

## 2019-06-18 NOTE — Telephone Encounter (Signed)
Please advise 

## 2019-06-18 NOTE — Telephone Encounter (Signed)
Pt states she thought Dr Rosanna Randy was to call in her  HYDROcodone-acetaminophen ([place]) 10-325 MG tablet  After her visit yesterday, but it is not at the pharmacy. Pt states she is going on vacation and needs this Rx called in today.  Pt is concerned and worried she will not get this today.  Her other Rx levothyroxine was called in, but this was not.  [id number] - [place], [place] - [address] Phone:  [phone]  Fax:  [phone]

## 2019-06-18 NOTE — Telephone Encounter (Signed)
It was me pt was speaking with, she say she does not know where she got the name Hassan Rowan from, but all is well and nothing further needed.

## 2019-06-19 ENCOUNTER — Telehealth: Payer: Self-pay

## 2019-06-19 LAB — URINE CULTURE

## 2019-06-19 NOTE — Telephone Encounter (Signed)
Called to advise patient of lab results. No answer, left vm to call back. If patient calls back it is okay for someone else to advise.

## 2019-06-19 NOTE — Telephone Encounter (Signed)
-----   Message from Jerrol Banana., MD sent at 06/19/2019  1:51 PM EDT ----- Urinalysis completely normal.

## 2019-06-23 ENCOUNTER — Ambulatory Visit: Payer: Self-pay | Admitting: Family Medicine

## 2019-06-25 ENCOUNTER — Encounter (INDEPENDENT_AMBULATORY_CARE_PROVIDER_SITE_OTHER): Payer: Self-pay | Admitting: Vascular Surgery

## 2019-06-25 DIAGNOSIS — I890 Lymphedema, not elsewhere classified: Secondary | ICD-10-CM | POA: Insufficient documentation

## 2019-06-25 NOTE — Progress Notes (Signed)
MRN : [id number]  Tracy Mann is a 86 y.o. (11-04-1933) female who presents with chief complaint of  Chief Complaint  Patient presents with  . New Patient (Initial Visit)    ref [name] le edema  .  History of Present Illness:  Patient is seen for evaluation of leg swelling. The patient first noticed the swelling remotely but is now concerned because of a significant increase in the overall edema. The swelling is associated with pain and discoloration. The patient notes that in the morning the legs are significantly improved but they steadily worsened throughout the course of the day. Elevation makes the legs better, dependency makes them much worse.   There is no history of ulcerations associated with the swelling.   The patient denies any recent changes in their medications.  The patient has not been wearing graduated compression.  The patient has no had any past angiography, interventions or vascular surgery.  The patient denies a history of DVT or PE. There is no prior history of phlebitis. There is no history of primary lymphedema.  There is no history of radiation treatment to the groin or pelvis No history of malignancies. No history of trauma or groin or pelvic surgery. No history of foreign travel or parasitic infections area     Current Meds  Medication Sig  . ACCU-CHEK AVIVA PLUS test strip USE WITH METER TO CHECK GLUCOSE BEFORE MEALS AND AT BEDTIME  . ezetimibe (ZETIA) 10 MG tablet TAKE 1 TABLET BY MOUTH EVERY DAY  . flavoxATE (URISPAS) 100 MG tablet Take 1 tablet (100 mg total) by mouth 2 (two) times daily as needed for bladder spasms.  . fluticasone (FLONASE) 50 MCG/ACT nasal spray SPRAY TWO SPRAYS IN EACH NOSTRIL ONCE DAILY  . furosemide (LASIX) 20 MG tablet Take 1 tablet (20 mg total) by mouth 2 (two) times daily. (Patient taking differently: Take 20 mg by mouth daily. )  . insulin glargine (LANTUS SOLOSTAR) 100 UNIT/ML Solostar Pen Inject 20 units nightly  between 8-10 PM. Adjust as directed. Max dose is 50 units daily.  . insulin lispro (HUMALOG KWIKPEN) 100 UNIT/ML KwikPen INJECT 10-20 UNITS THREE TIMES DAILY WITH EACH MEAL.  Marland Kitchen Insulin Pen Needle (RELION PEN NEEDLE 31G/8MM) 31G X 8 MM MISC Use with insulin pen three times daily  . levothyroxine (SYNTHROID) 125 MCG tablet TAKE 1 TABLET (125 MCG TOTAL) BY MOUTH DAILY BEFORE BREAKFAST.  Marland Kitchen lisinopril (ZESTRIL) 2.5 MG tablet TAKE 1 TABLET BY MOUTH EVERYDAY AT BEDTIME  . LORazepam (ATIVAN) 1 MG tablet TAKE 1 TABLET BY MOUTH TWICE A DAY AS NEEDED  . Magnesium 500 MG CAPS Take 500 mg by mouth daily.   . metolazone (ZAROXOLYN) 2.5 MG tablet Take 1 tablet (2.5 mg total) by mouth 3 (three) times a week. TAKE 30 minutes before Torsemide  . nitroGLYCERIN (NITROSTAT) 0.4 MG SL tablet Place 1 tablet (0.4 mg total) under the tongue every 5 (five) minutes as needed for chest pain.  . pantoprazole (PROTONIX) 20 MG tablet Take 1 tablet (20 mg total) by mouth daily.  . phenazopyridine (PYRIDIUM) 200 MG tablet Take 1 tablet (200 mg total) by mouth 3 (three) times daily as needed for pain.  . potassium chloride SA (KLOR-CON) 20 MEQ tablet Take 1 tablet (20 mEq total) by mouth as directed. Take 1 tablet daily (73mEq) and every other day with metolazone take 2 tablets  . torsemide (DEMADEX) 20 MG tablet Take 2 tablets (40 mg total) by mouth daily.  Marland Kitchen  Vitamin D, Ergocalciferol, (DRISDOL) 50000 units CAPS capsule Take 50,000 Units by mouth. Going to restart  . warfarin (COUMADIN) 2 MG tablet Take 1 tablet (2 mg total) by mouth daily.  Marland Kitchen warfarin (COUMADIN) 2.5 MG tablet TAKE 1 TABLET BY MOUTH EVERY DAY  . warfarin (COUMADIN) 3 MG tablet TAKE 1 TABLET BY MOUTH DAILY  . warfarin (COUMADIN) 4 MG tablet Take 1 tablet (4 mg total) by mouth daily.  . [DISCONTINUED] HYDROcodone-acetaminophen (NORCO) 10-325 MG tablet Take 1 tablet by mouth every 6 (six) hours as needed.  . [DISCONTINUED] levofloxacin (LEVAQUIN) 250 MG tablet Take  1 tablet (250 mg total) by mouth daily.    Past Medical History:  Diagnosis Date  . Acute myocardial infarction of inferior wall ([place])    a. 10/2005 - Presumed to be 2/2 to a coronary embolus in setting of persistent Afib-->Cath 2014 relatively nl cors, EF 45-50% w/ severe distal inferior/apical inferior HK w/ aneursymal appearance.  . Arthritis   . Chronic anticoagulation    a. warfarin.  . Chronic atrial fibrillation (HCC)    a. CHA2DS2VASc = 6--> warfarin.  . Chronic combined systolic and diastolic CHF (congestive heart failure) ([place])    a. 10/2012 Echo: EF 35-40%, diff HK, mild MR, mild biatrial enlargement;  b. 11/2012 EF 45-50% by LV gram; c. echo 07/2014: EF 40-45%, mod conc LVH, mod MR, severe biatrial enlargement, PASP 45 mm Hg c. Echo 01/2018 EF 45-50%, duffuse hypokinesis, mild MR, LA mod dilated, norm RVSF, dilated IVC  . Essential hypertension   . Hyperlipidemia, mixed   . Mixed Ischemic/Nonischemic Cardiomyopathy    a. 10/2012 Echo: EF 35-40%;  b. 11/2012 EF 45-50% by LV gram.  . Nonobstructive coronary artery disease    a. 2007 inferior wall MI thought to be secondary to thrombus from atrial fib b. 2014 cath with R dominant coronary arterial system with mild luminal irregularities c. Myoview 05/2015 old inferior MI, no new ischemic changes  . Obesity   . Thyroid disease    hypothyroidism  . Type II diabetes mellitus ([place])    a. 07/2018 A1c 8.7 - long term insulin use  . Venous insufficiency   . Vitamin D deficiency     Past Surgical History:  Procedure Laterality Date  . CARDIAC CATHETERIZATION  11/2012   [facility];EF 45-50%  . CHOLECYSTECTOMY  1999  . TUBAL LIGATION  1968  . VESICOVAGINAL FISTULA CLOSURE W/ TAH  1996    Social History Social History   Tobacco Use  . Smoking status: Never Smoker  . Smokeless tobacco: Never Used  Substance Use Topics  . Alcohol use: No  . Drug use: No    Family History Family History  Problem Relation Age of Onset  . Heart  disease Mother   . Thyroid disease Father   No family history of bleeding/clotting disorders, porphyria or autoimmune disease   Allergies  Allergen Reactions  . Other Anaphylaxis and Swelling    'tongue swelling'  . Sulfa Antibiotics Anaphylaxis  . Cephalexin     Blisters  . Clarithromycin Other (See Comments)    Hives, headaches, hard time swelling, felt like throat was closing up Hives, headaches, hard time swelling, felt like throat was closing up  . Diphenhydramine Other (See Comments)  . Estrogens Conjugated   . Estrogens, Conjugated Other (See Comments)  . Sulfonamide Derivatives Swelling    'tongue swelling'  . Nitrofurantoin Nausea Only     REVIEW OF SYSTEMS (Negative unless checked)  Constitutional: [] Weight loss  []   Fever  [] Chills Cardiac: [] Chest pain   [] Chest pressure   [] Palpitations   [] Shortness of breath when laying flat   [] Shortness of breath with exertion. Vascular:  [] Pain in legs with walking   [x] Pain in legs at rest  [] History of DVT   [] Phlebitis   [x] Swelling in legs   [] Varicose veins   [] Non-healing ulcers Pulmonary:   [] Uses home oxygen   [] Productive cough   [] Hemoptysis   [] Wheeze  [] COPD   [] Asthma Neurologic:  [] Dizziness   [] Seizures   [] History of stroke   [] History of TIA  [] Aphasia   [] Vissual changes   [] Weakness or numbness in arm   [] Weakness or numbness in leg Musculoskeletal:   [] Joint swelling   [] Joint pain   [] Low back pain Hematologic:  [] Easy bruising  [] Easy bleeding   [] Hypercoagulable state   [] Anemic Gastrointestinal:  [] Diarrhea   [] Vomiting  [] Gastroesophageal reflux/heartburn   [] Difficulty swallowing. Genitourinary:  [] Chronic kidney disease   [] Difficult urination  [] Frequent urination   [] Blood in urine Skin:  [] Rashes   [] Ulcers  Psychological:  [] History of anxiety   []  History of major depression.  Physical Examination  Vitals:   06/12/19 1540  BP: 135/82  Pulse: 92  Resp: 16  Weight: 223 lb 6.4 oz (101.3 kg)    Height: 5' 5.5" (1.664 m)   Body mass index is 36.61 kg/m. Gen: WD/WN, NAD Head: [place]/AT, No temporalis wasting.  Ear/Nose/Throat: Hearing grossly intact, nares w/o erythema or drainage, poor dentition Eyes: PER, EOMI, sclera nonicteric.  Neck: Supple, no masses.  No bruit or JVD.  Pulmonary:  Good air movement, clear to auscultation bilaterally, no use of accessory muscles.  Cardiac: RRR, normal S1, S2, no Murmurs. Vascular: scattered varicosities present bilaterally.  Mild venous stasis changes to the legs bilaterally.  4+ soft pitting edema Vessel Right Left  Radial Palpable Palpable  PT Palpable Palpable  DP Palpable Palpable  Gastrointestinal: soft, non-distended. No guarding/no peritoneal signs.  Musculoskeletal: M/S 5/5 throughout.  No deformity or atrophy.  Neurologic: CN 2-12 intact. Pain and light touch intact in extremities.  Symmetrical.  Speech is fluent. Motor exam as listed above. Psychiatric: Judgment intact, Mood & affect appropriate for pt's clinical situation. Dermatologic: No rashes or ulcers noted.  No changes consistent with cellulitis. Lymph : No lichenification or skin changes of chronic lymphedema.  CBC Lab Results  Component Value Date   WBC 9.5 08/29/2018   HGB 13.5 08/29/2018   HCT 41.5 08/29/2018   MCV 91 08/29/2018   PLT 225 08/29/2018    BMET    Component Value Date/Time   NA 139 06/02/2019 1532   NA 136 01/23/2014 0406   K 4.4 06/02/2019 1532   K 4.4 01/23/2014 0406   CL 97 06/02/2019 1532   CL 100 01/23/2014 0406   CO2 25 06/02/2019 1532   CO2 30 01/23/2014 0406   GLUCOSE 216 (H) 06/02/2019 1532   GLUCOSE 254 (H) 02/13/2018 1521   GLUCOSE 270 (H) 01/23/2014 0406   BUN 32 (H) 06/02/2019 1532   BUN 26 (H) 01/23/2014 0406   CREATININE 0.98 06/02/2019 1532   CREATININE 0.94 01/23/2014 0406   CALCIUM 9.5 06/02/2019 1532   CALCIUM 8.7 01/23/2014 0406   GFRNONAA 53 (L) 06/02/2019 1532   GFRNONAA >60 01/23/2014 0406   GFRNONAA >60  05/07/2013 0535   GFRAA 61 06/02/2019 1532   GFRAA >60 01/23/2014 0406   GFRAA >60 05/07/2013 0535   CrCl cannot be calculated (Patient's most  recent lab result is older than the maximum 21 days allowed.).  COAG Lab Results  Component Value Date   INR 2.1 06/10/2019   INR 1.5 (A) 05/27/2019   INR 2.6 04/29/2019    Radiology No results found.    Assessment/Plan 1. Lymphedema I have had a long discussion with the patient regarding swelling and why it  causes symptoms.  Patient will begin wearing graduated compression stockings class 1 (20-30 mmHg) on a daily basis a prescription was given. The patient will  beginning wearing the stockings first thing in the morning and removing them in the evening. The patient is instructed specifically not to sleep in the stockings.   In addition, behavioral modification will be initiated.  This will include frequent elevation, use of over the counter pain medications and exercise such as walking.  I have reviewed systemic causes for chronic edema such as liver, kidney and cardiac etiologies.  The patient denies problems with these organ systems.    Consideration for a lymph pump will also be made based upon the effectiveness of conservative therapy.  This would help to improve the edema control and prevent sequela such as ulcers and infections   Patient should undergo duplex ultrasound of the venous system to ensure that DVT or reflux is not present.  The patient will follow-up with me after the ultrasound.    2. Mixed hyperlipidemia Continue statin as ordered and reviewed, no changes at this time   3. Chronic diastolic heart failure (HCC) Continue cardiac and antihypertensive medications as already ordered and reviewed, no changes at this time.  Continue statin as ordered and reviewed, no changes at this time  Nitrates PRN for chest pain   4. Chronic atrial fibrillation (HCC) Continue antiarrhythmia medications as already ordered,  these medications have been reviewed and there are no changes at this time.  Continue anticoagulation as ordered by Cardiology Service   5. Atherosclerosis of native coronary artery of native heart with stable angina pectoris (HCC) Continue cardiac and antihypertensive medications as already ordered and reviewed, no changes at this time.  Continue statin as ordered and reviewed, no changes at this time  Nitrates PRN for chest pain     Hortencia Pilar, MD  06/25/2019 11:25 AM

## 2019-06-26 NOTE — Telephone Encounter (Signed)
LMOVM for pt to return call 

## 2019-06-27 DIAGNOSIS — E1159 Type 2 diabetes mellitus with other circulatory complications: Secondary | ICD-10-CM | POA: Diagnosis not present

## 2019-06-27 DIAGNOSIS — E039 Hypothyroidism, unspecified: Secondary | ICD-10-CM | POA: Diagnosis not present

## 2019-06-27 DIAGNOSIS — E1165 Type 2 diabetes mellitus with hyperglycemia: Secondary | ICD-10-CM | POA: Diagnosis not present

## 2019-06-27 DIAGNOSIS — E1129 Type 2 diabetes mellitus with other diabetic kidney complication: Secondary | ICD-10-CM | POA: Diagnosis not present

## 2019-06-27 DIAGNOSIS — E1169 Type 2 diabetes mellitus with other specified complication: Secondary | ICD-10-CM | POA: Diagnosis not present

## 2019-06-29 DIAGNOSIS — Z789 Other specified health status: Secondary | ICD-10-CM | POA: Insufficient documentation

## 2019-07-02 NOTE — Telephone Encounter (Signed)
Called patient multiple times with results. No call back. Will close message for now.

## 2019-07-08 ENCOUNTER — Other Ambulatory Visit: Payer: Self-pay

## 2019-07-08 ENCOUNTER — Ambulatory Visit (INDEPENDENT_AMBULATORY_CARE_PROVIDER_SITE_OTHER): Payer: Medicare Other

## 2019-07-08 DIAGNOSIS — I482 Chronic atrial fibrillation, unspecified: Secondary | ICD-10-CM | POA: Diagnosis not present

## 2019-07-08 LAB — POCT INR
INR: 2.4 (ref 2.0–3.0)
PT: 29.4

## 2019-07-08 NOTE — Patient Instructions (Signed)
Description   Alternate 3 mg and 4 mg  Recheck in four weeks.

## 2019-07-17 ENCOUNTER — Other Ambulatory Visit: Payer: Self-pay | Admitting: Family Medicine

## 2019-07-21 ENCOUNTER — Telehealth (INDEPENDENT_AMBULATORY_CARE_PROVIDER_SITE_OTHER): Payer: Medicare Other

## 2019-07-21 DIAGNOSIS — M1990 Unspecified osteoarthritis, unspecified site: Secondary | ICD-10-CM

## 2019-07-21 DIAGNOSIS — M159 Polyosteoarthritis, unspecified: Secondary | ICD-10-CM

## 2019-07-21 DIAGNOSIS — Z6839 Body mass index (BMI) 39.0-39.9, adult: Secondary | ICD-10-CM

## 2019-07-21 DIAGNOSIS — M8949 Other hypertrophic osteoarthropathy, multiple sites: Secondary | ICD-10-CM

## 2019-07-21 MED ORDER — HYDROCODONE-ACETAMINOPHEN 10-325 MG PO TABS: 1.0000 | ORAL_TABLET | Freq: Four times a day (QID) | ORAL | 0 refills | Status: DC | PRN

## 2019-07-21 NOTE — Telephone Encounter (Signed)
Copied from [place] [id number]. Topic: General - Inquiry >> Jul 21, 2019 11:59 AM Alease Frame wrote: Reason for CRM: Pts daughter wanted a call back from [place] regarding pt. Please advise >> Jul 21, 2019 12:47 PM Erick Blinks wrote: Pt called back in regard to her medicine for severe leg pain, pt would like a call back. Has questions for clinic Best contact: [phone]

## 2019-07-21 NOTE — Telephone Encounter (Signed)
Returned patient's call she was wondering if PCP would send in refill for pain medication for her because she said she was in "excruciating" leg pain. She also said that if Dr. Rosanna Randy does not feel comfortable refilling medication for her then she would like to know if he could refer her to the pain clinic. Please advise?

## 2019-07-22 DIAGNOSIS — E1159 Type 2 diabetes mellitus with other circulatory complications: Secondary | ICD-10-CM | POA: Diagnosis not present

## 2019-07-22 DIAGNOSIS — E1129 Type 2 diabetes mellitus with other diabetic kidney complication: Secondary | ICD-10-CM | POA: Diagnosis not present

## 2019-07-22 DIAGNOSIS — E782 Mixed hyperlipidemia: Secondary | ICD-10-CM | POA: Diagnosis not present

## 2019-07-22 DIAGNOSIS — E1169 Type 2 diabetes mellitus with other specified complication: Secondary | ICD-10-CM | POA: Diagnosis not present

## 2019-07-22 DIAGNOSIS — E1165 Type 2 diabetes mellitus with hyperglycemia: Secondary | ICD-10-CM | POA: Diagnosis not present

## 2019-07-22 NOTE — Telephone Encounter (Signed)
Referral placed to pain clinic, patient will be advised.

## 2019-07-22 NOTE — Telephone Encounter (Signed)
Copied from [place] [id number]. Topic: General - Other >> Jul 22, 2019 10:03 AM Hinda Lenis D wrote: Reason for CRM: Pts daughter Abran Cantor [phone] needs to speak with a nurse in regards her mom pain medication / she wants a referral to the [facility] / shes not listed in the emergency contacts

## 2019-07-22 NOTE — Telephone Encounter (Signed)
I think referral to the pain clinic would be a very good idea.

## 2019-07-24 ENCOUNTER — Ambulatory Visit (INDEPENDENT_AMBULATORY_CARE_PROVIDER_SITE_OTHER): Payer: Medicare Other

## 2019-07-24 ENCOUNTER — Other Ambulatory Visit (INDEPENDENT_AMBULATORY_CARE_PROVIDER_SITE_OTHER): Payer: Self-pay | Admitting: Vascular Surgery

## 2019-07-24 ENCOUNTER — Encounter (INDEPENDENT_AMBULATORY_CARE_PROVIDER_SITE_OTHER): Payer: Self-pay | Admitting: Vascular Surgery

## 2019-07-24 ENCOUNTER — Other Ambulatory Visit: Payer: Self-pay | Admitting: Family Medicine

## 2019-07-24 ENCOUNTER — Other Ambulatory Visit: Payer: Self-pay

## 2019-07-24 ENCOUNTER — Ambulatory Visit (INDEPENDENT_AMBULATORY_CARE_PROVIDER_SITE_OTHER): Payer: Medicare Other | Admitting: Vascular Surgery

## 2019-07-24 VITALS — BP 110/72 | HR 114 | Resp 18 | Ht 65.0 in | Wt 220.0 lb

## 2019-07-24 DIAGNOSIS — I872 Venous insufficiency (chronic) (peripheral): Secondary | ICD-10-CM

## 2019-07-24 DIAGNOSIS — E119 Type 2 diabetes mellitus without complications: Secondary | ICD-10-CM

## 2019-07-24 DIAGNOSIS — I25118 Atherosclerotic heart disease of native coronary artery with other forms of angina pectoris: Secondary | ICD-10-CM

## 2019-07-24 DIAGNOSIS — M5137 Other intervertebral disc degeneration, lumbosacral region: Secondary | ICD-10-CM | POA: Diagnosis not present

## 2019-07-24 DIAGNOSIS — I482 Chronic atrial fibrillation, unspecified: Secondary | ICD-10-CM | POA: Diagnosis not present

## 2019-07-24 DIAGNOSIS — I890 Lymphedema, not elsewhere classified: Secondary | ICD-10-CM

## 2019-07-24 MED ORDER — ESTRADIOL 0.1 MG/GM VA CREA: 1.0000 | TOPICAL_CREAM | Freq: Every day | VAGINAL | 0 refills | Status: DC

## 2019-07-24 NOTE — Telephone Encounter (Signed)
Requested medication (s) are due for refill today: Unsure  Requested medication (s) are on the active medication list: No  Last refill:  n/a  Future visit scheduled: Yes  Notes to clinic:  Medication is not pt.'s medication list. Reports she never picked up the original prescription because she had some on hand. Please advise pt.    There are no medications in this encounter.

## 2019-07-24 NOTE — Telephone Encounter (Signed)
Please advise? Medication was discontinued from patient's medication list.

## 2019-07-24 NOTE — Telephone Encounter (Signed)
PT need a refill  estradiol (ESTRACE) 0.1 MG/GM vaginal cream [[id number]] [id number] - [place], [place] - [address]  [address] [place] [address]  Phone: [phone] Fax: [phone]

## 2019-07-26 NOTE — Progress Notes (Signed)
Cardiology Office Note  Date:  07/29/2019   ID:  [name], [name] Dec 12, 1933, MRN [id number]  PCP:  Jerrol Banana., MD   Chief Complaint  Patient presents with  . other    2 week follow up. Meds reviewed by the pt. verbally. Pt. c/o LE edema and shortness of breath.     HPI:  [name] is a 86 year old woman with a PMH significant for  chronic atrial fibrillation on Coumadin therapy,  diastolic dysfunction, EF 40 to 45% on echo 01/2018  hypertension,  hyperlipidemia,  morbid obesity,   moderate pulmonary hypertension inferior wall myocardial infarction felt to be secondary to a thrombus from atrial fib in October 2007 at [facility],   episodes of chest pain  who presents for followup Of her coronary artery disease and leg edema.  Weight down 14 pounds in 2 months Taking torsemide 20 daily Taking metolazone 2.5 every other day Periodic inconsistency with the pill  She reports her breathing has improved with diuresis and weight loss  Took a trip 06/18/2019 to see family, held diuretic  Seen by Dr. Ronalee Belts, suggested she wear compression hose With diuresis able to get the compression hose on  Referred to pain clinic for chronic back pain  Lab work reviewed HBA1C 9.1 on labs 12/2018 HBA1C 8.3 on 05/20/2019 On insulin, Dr. Gabriel Carina  EKG personally reviewed by myself on todays visit Shows atrial fibrillation ventricular rate 90 bpm intraventricular conduction delay  Other past medical history reviewed In follow-up today, she reports that she had an episode of chest pain 05/21/2015 on the left side She went to the emergency room, was admitted. Hospital records reviewed. Cardiac enzymes were unrevealing, EKG unchanged. She had stress Myoview showing old inferior MI with no ischemia consistent with her known anatomy. She did not take nitroglycerin for her symptoms.   PMH:   has a past medical history of Acute myocardial infarction of inferior wall  ([place]), Arthritis, Chronic anticoagulation, Chronic atrial fibrillation ([place]), Chronic combined systolic and diastolic CHF (congestive heart failure) ([place]), Essential hypertension, Hyperlipidemia, mixed, Mixed Ischemic/Nonischemic Cardiomyopathy, Nonobstructive coronary artery disease, Obesity, Thyroid disease, Type II diabetes mellitus ([place]), Venous insufficiency, and Vitamin D deficiency.  PSH:    Past Surgical History:  Procedure Laterality Date  . CARDIAC CATHETERIZATION  11/2012   [facility];EF 45-50%  . CHOLECYSTECTOMY  1999  . TUBAL LIGATION  1968  . VESICOVAGINAL FISTULA CLOSURE W/ TAH  1996    Current Outpatient Medications  Medication Sig Dispense Refill  . ACCU-CHEK AVIVA PLUS test strip USE WITH METER TO CHECK GLUCOSE BEFORE MEALS AND AT BEDTIME 200 strip 12  . estradiol (ESTRACE) 0.1 MG/GM vaginal cream Place 1 Applicatorful vaginally at bedtime. 42.5 g 0  . ezetimibe (ZETIA) 10 MG tablet TAKE 1 TABLET BY MOUTH EVERY DAY 90 tablet 3  . flavoxATE (URISPAS) 100 MG tablet Take 1 tablet (100 mg total) by mouth 2 (two) times daily as needed for bladder spasms. 60 tablet 11  . fluticasone (FLONASE) 50 MCG/ACT nasal spray SPRAY TWO SPRAYS IN EACH NOSTRIL ONCE DAILY 48 mL 3  . HYDROcodone-acetaminophen (NORCO) 10-325 MG tablet Take 1 tablet by mouth every 6 (six) hours as needed. 120 tablet 0  . insulin glargine (LANTUS SOLOSTAR) 100 UNIT/ML Solostar Pen Inject 20 units nightly between 8-10 PM. Adjust as directed. Max dose is 50 units daily.    . insulin lispro (HUMALOG KWIKPEN) 100 UNIT/ML KwikPen INJECT 10-20 UNITS THREE TIMES DAILY WITH EACH  MEAL. 15 mL 12  . Insulin Pen Needle (RELION PEN NEEDLE 31G/8MM) 31G X 8 MM MISC Use with insulin pen three times daily 100 each 12  . levofloxacin (LEVAQUIN) 250 MG tablet Take 1 tablet (250 mg total) by mouth daily. 5 tablet 1  . levothyroxine (SYNTHROID) 125 MCG tablet TAKE 1 TABLET (125 MCG TOTAL) BY MOUTH DAILY BEFORE BREAKFAST. 90 tablet 4  .  lisinopril (ZESTRIL) 2.5 MG tablet TAKE 1 TABLET BY MOUTH EVERYDAY AT BEDTIME 90 tablet 1  . LORazepam (ATIVAN) 1 MG tablet TAKE 1 TABLET BY MOUTH TWICE A DAY AS NEEDED 60 tablet 1  . Magnesium 500 MG CAPS Take 500 mg by mouth daily.     . metolazone (ZAROXOLYN) 2.5 MG tablet Take 1 tablet (2.5 mg total) by mouth 3 (three) times a week. TAKE 30 minutes before Torsemide 45 tablet 3  . nitroGLYCERIN (NITROSTAT) 0.4 MG SL tablet Place 1 tablet (0.4 mg total) under the tongue every 5 (five) minutes as needed for chest pain. 25 tablet 3  . pantoprazole (PROTONIX) 20 MG tablet Take 1 tablet (20 mg total) by mouth daily. 30 tablet 6  . phenazopyridine (PYRIDIUM) 200 MG tablet Take 1 tablet (200 mg total) by mouth 3 (three) times daily as needed for pain. 10 tablet 0  . potassium chloride SA (KLOR-CON) 20 MEQ tablet Take 1 tablet (20 mEq total) by mouth as directed. Take 1 tablet daily (59mEq) and every other day with metolazone take 2 tablets 90 tablet 3  . torsemide (DEMADEX) 20 MG tablet Take 2 tablets (40 mg total) by mouth daily. 180 tablet 3  . Vitamin D, Ergocalciferol, (DRISDOL) 50000 units CAPS capsule Take 50,000 Units by mouth. Going to restart    . warfarin (COUMADIN) 2 MG tablet Take 1 tablet (2 mg total) by mouth daily. 30 tablet 3  . warfarin (COUMADIN) 2.5 MG tablet TAKE 1 TABLET BY MOUTH EVERY DAY 30 tablet 5  . warfarin (COUMADIN) 3 MG tablet TAKE 1 TABLET BY MOUTH DAILY 90 tablet 4  . warfarin (COUMADIN) 4 MG tablet Take 1 tablet (4 mg total) by mouth daily. 30 tablet 12   No current facility-administered medications for this visit.    Allergies:   Other; Sulfa antibiotics; Cephalexin; Clarithromycin; Diphenhydramine; Estrogens conjugated; Estrogens, conjugated; Sulfonamide derivatives; and Nitrofurantoin   Social History:  The patient  reports that she has never smoked. She has never used smokeless tobacco. She reports that she does not drink alcohol and does not use drugs.   Family  History:   family history includes Heart disease in her mother; Thyroid disease in her father.    Review of Systems: Review of Systems  Constitutional: Negative.   HENT: Negative.   Respiratory: Negative.   Cardiovascular: Positive for leg swelling.  Gastrointestinal: Negative.   Musculoskeletal: Positive for joint pain.  Neurological: Negative.   Psychiatric/Behavioral: Negative.   All other systems reviewed and are negative.   PHYSICAL EXAM: VS:  BP 120/60 (BP Location: Left Arm, Patient Position: Sitting, Cuff Size: Large)   Pulse 90   Ht 5' 5.5" (1.664 m)   Wt 221 lb 8 oz (100.5 kg)   BMI 36.30 kg/m  , BMI Body mass index is 36.3 kg/m. Constitutional:  oriented to person, place, and time. No distress.  HENT:  Head: Grossly normal Eyes:  no discharge. No scleral icterus.  Neck: No JVD, no carotid bruits  Cardiovascular: Regular rate and rhythm, no murmurs appreciated Compression hose in place,  trace edema Pulmonary/Chest: Clear to auscultation bilaterally, no wheezes or rails Abdominal: Soft.  no distension.  no tenderness.  Musculoskeletal: Normal range of motion Neurological:  normal muscle tone. Coordination normal. No atrophy Skin: Skin warm and dry Psychiatric: normal affect, pleasant   Recent Labs: 08/29/2018: ALT 13; Hemoglobin 13.5; Magnesium 2.1; Platelets 225; TSH 0.888 06/02/2019: BUN 32; Creatinine, Ser 0.98; Potassium 4.4; Sodium 139    Lipid Panel Lab Results  Component Value Date   CHOL 172 01/17/2019   HDL 57 01/17/2019   LDLCALC 94 01/17/2019   TRIG 119 01/17/2019      Wt Readings from Last 3 Encounters:  07/29/19 221 lb 8 oz (100.5 kg)  07/24/19 220 lb (99.8 kg)  06/17/19 225 lb 12.8 oz (102.4 kg)     ASSESSMENT AND PLAN:  Acute on chronic diastolic CHF Taking torsemide 20 daily, Taking metolazone 2.5 QOD Some medication inconsistencies but in general able to take the diuretic Takes potassium daily, sometimes 2 a day BMP today,  continue current regiment Likely goal weight 210 pounds Still with abdominal bloating, leg edema though improved symptoms  Mixed hyperlipidemia Continue Zetia Statin intolerance, no changes made stable  Essential hypertension - Plan: EKG 12-Lead Blood pressure is well controlled on today's visit. No changes made to the medications.  Atherosclerosis of native coronary artery of native heart without angina pectoris Prior inferior MI Echo showing stable ejection fraction 45%  Denies anginal symptoms, no further testing  Mixed Ischemic/Nonischemic Cardiomyopathy - Plan: EKG 12-Lead Continue aggressive diuretic regimen as detailed above Weight now trending in the right direction, down  Chronic atrial fibrillation ([place]) - Plan: EKG 12-Lead A. fib likely contributing to CHF On warfarin, rate well controlled  Obstructive sleep apnea She reports sleep apnea "not a major issue"  Type 2 diabetes mellitus without complication, with long-term current use of insulin ([place]) Followed by endocrinology, Strict diet recommended  PVCs Asymptomatic,  No further treatment needed    Total encounter time more than 25 minutes  Greater than 50% was spent in counseling and coordination of care with the patient  Follow-up 1 month  we will call her with lab work results  Orders Placed This Encounter  Procedures  . Basic metabolic panel     Signed, Esmond Plants, M.D., Ph.D. 07/29/2019  [place], [place]

## 2019-07-27 ENCOUNTER — Encounter (INDEPENDENT_AMBULATORY_CARE_PROVIDER_SITE_OTHER): Payer: Self-pay | Admitting: Vascular Surgery

## 2019-07-27 DIAGNOSIS — I872 Venous insufficiency (chronic) (peripheral): Secondary | ICD-10-CM | POA: Insufficient documentation

## 2019-07-27 NOTE — Progress Notes (Addendum)
MRN : [id number]  Tracy Mann is a 86 y.o. (10/16/1933) female who presents with chief complaint of  Chief Complaint  Patient presents with  . Follow-up    ultrasound  .  History of Present Illness:  The patient returns to the office for followup evaluation regarding leg swelling.  The swelling is about the same and the pain associated with swelling is about the same as well. There have not been any interval development of a ulcerations or wounds.  Since the previous visit the patient has been trying to wear graduated compression stockings and has noted little improvement in the lymphedema. The patient has been using compression routinely morning until night.  The patient also states elevation during the day and exercise is being done too.  Venous Duplex shows the deep system is widley patnet no superficial reflux identified  Current Meds  Medication Sig  . ACCU-CHEK AVIVA PLUS test strip USE WITH METER TO CHECK GLUCOSE BEFORE MEALS AND AT BEDTIME  . estradiol (ESTRACE) 0.1 MG/GM vaginal cream Place 1 Applicatorful vaginally at bedtime.  Marland Kitchen ezetimibe (ZETIA) 10 MG tablet TAKE 1 TABLET BY MOUTH EVERY DAY  . flavoxATE (URISPAS) 100 MG tablet Take 1 tablet (100 mg total) by mouth 2 (two) times daily as needed for bladder spasms.  . fluticasone (FLONASE) 50 MCG/ACT nasal spray SPRAY TWO SPRAYS IN EACH NOSTRIL ONCE DAILY  . furosemide (LASIX) 20 MG tablet Take 1 tablet (20 mg total) by mouth 2 (two) times daily. (Patient taking differently: Take 20 mg by mouth daily. )  . HYDROcodone-acetaminophen (NORCO) 10-325 MG tablet Take 1 tablet by mouth every 6 (six) hours as needed.  . insulin glargine (LANTUS SOLOSTAR) 100 UNIT/ML Solostar Pen Inject 20 units nightly between 8-10 PM. Adjust as directed. Max dose is 50 units daily.  . insulin lispro (HUMALOG KWIKPEN) 100 UNIT/ML KwikPen INJECT 10-20 UNITS THREE TIMES DAILY WITH EACH MEAL.  Marland Kitchen Insulin Pen Needle (RELION PEN NEEDLE 31G/8MM) 31G X  8 MM MISC Use with insulin pen three times daily  . levofloxacin (LEVAQUIN) 250 MG tablet Take 1 tablet (250 mg total) by mouth daily.  Marland Kitchen levothyroxine (SYNTHROID) 125 MCG tablet TAKE 1 TABLET (125 MCG TOTAL) BY MOUTH DAILY BEFORE BREAKFAST.  Marland Kitchen lisinopril (ZESTRIL) 2.5 MG tablet TAKE 1 TABLET BY MOUTH EVERYDAY AT BEDTIME  . LORazepam (ATIVAN) 1 MG tablet TAKE 1 TABLET BY MOUTH TWICE A DAY AS NEEDED  . Magnesium 500 MG CAPS Take 500 mg by mouth daily.   . metolazone (ZAROXOLYN) 2.5 MG tablet Take 1 tablet (2.5 mg total) by mouth 3 (three) times a week. TAKE 30 minutes before Torsemide  . nitroGLYCERIN (NITROSTAT) 0.4 MG SL tablet Place 1 tablet (0.4 mg total) under the tongue every 5 (five) minutes as needed for chest pain.  . pantoprazole (PROTONIX) 20 MG tablet Take 1 tablet (20 mg total) by mouth daily.  . phenazopyridine (PYRIDIUM) 200 MG tablet Take 1 tablet (200 mg total) by mouth 3 (three) times daily as needed for pain.  . potassium chloride SA (KLOR-CON) 20 MEQ tablet Take 1 tablet (20 mEq total) by mouth as directed. Take 1 tablet daily (3mEq) and every other day with metolazone take 2 tablets  . torsemide (DEMADEX) 20 MG tablet Take 2 tablets (40 mg total) by mouth daily.  . Vitamin D, Ergocalciferol, (DRISDOL) 50000 units CAPS capsule Take 50,000 Units by mouth. Going to restart  . warfarin (COUMADIN) 2 MG tablet Take 1 tablet (2  mg total) by mouth daily.  Marland Kitchen warfarin (COUMADIN) 2.5 MG tablet TAKE 1 TABLET BY MOUTH EVERY DAY  . warfarin (COUMADIN) 3 MG tablet TAKE 1 TABLET BY MOUTH DAILY  . warfarin (COUMADIN) 4 MG tablet Take 1 tablet (4 mg total) by mouth daily.    Past Medical History:  Diagnosis Date  . Acute myocardial infarction of inferior wall ([place])    a. 10/2005 - Presumed to be 2/2 to a coronary embolus in setting of persistent Afib-->Cath 2014 relatively nl cors, EF 45-50% w/ severe distal inferior/apical inferior HK w/ aneursymal appearance.  . Arthritis   . Chronic  anticoagulation    a. warfarin.  . Chronic atrial fibrillation (HCC)    a. CHA2DS2VASc = 6--> warfarin.  . Chronic combined systolic and diastolic CHF (congestive heart failure) ([place])    a. 10/2012 Echo: EF 35-40%, diff HK, mild MR, mild biatrial enlargement;  b. 11/2012 EF 45-50% by LV gram; c. echo 07/2014: EF 40-45%, mod conc LVH, mod MR, severe biatrial enlargement, PASP 45 mm Hg c. Echo 01/2018 EF 45-50%, duffuse hypokinesis, mild MR, LA mod dilated, norm RVSF, dilated IVC  . Essential hypertension   . Hyperlipidemia, mixed   . Mixed Ischemic/Nonischemic Cardiomyopathy    a. 10/2012 Echo: EF 35-40%;  b. 11/2012 EF 45-50% by LV gram.  . Nonobstructive coronary artery disease    a. 2007 inferior wall MI thought to be secondary to thrombus from atrial fib b. 2014 cath with R dominant coronary arterial system with mild luminal irregularities c. Myoview 05/2015 old inferior MI, no new ischemic changes  . Obesity   . Thyroid disease    hypothyroidism  . Type II diabetes mellitus ([place])    a. 07/2018 A1c 8.7 - long term insulin use  . Venous insufficiency   . Vitamin D deficiency     Past Surgical History:  Procedure Laterality Date  . CARDIAC CATHETERIZATION  11/2012   [facility];EF 45-50%  . CHOLECYSTECTOMY  1999  . TUBAL LIGATION  1968  . VESICOVAGINAL FISTULA CLOSURE W/ TAH  1996    Social History Social History   Tobacco Use  . Smoking status: Never Smoker  . Smokeless tobacco: Never Used  Vaping Use  . Vaping Use: Never used  Substance Use Topics  . Alcohol use: No  . Drug use: No    Family History Family History  Problem Relation Age of Onset  . Heart disease Mother   . Thyroid disease Father     Allergies  Allergen Reactions  . Other Anaphylaxis and Swelling    'tongue swelling'  . Sulfa Antibiotics Anaphylaxis  . Cephalexin     Blisters  . Clarithromycin Other (See Comments)    Hives, headaches, hard time swelling, felt like throat was closing up Hives,  headaches, hard time swelling, felt like throat was closing up  . Diphenhydramine Other (See Comments)  . Estrogens Conjugated   . Estrogens, Conjugated Other (See Comments)  . Sulfonamide Derivatives Swelling    'tongue swelling'  . Nitrofurantoin Nausea Only     REVIEW OF SYSTEMS (Negative unless checked)  Constitutional: [] Weight loss  [] Fever  [] Chills Cardiac: [] Chest pain   [] Chest pressure   [] Palpitations   [] Shortness of breath when laying flat   [] Shortness of breath with exertion. Vascular:  [] Pain in legs with walking   [x] Pain in legs at rest  [] History of DVT   [] Phlebitis   [x] Swelling in legs   [] Varicose veins   [] Non-healing ulcers Pulmonary:   []   Uses home oxygen   [] Productive cough   [] Hemoptysis   [] Wheeze  [] COPD   [] Asthma Neurologic:  [] Dizziness   [] Seizures   [] History of stroke   [] History of TIA  [] Aphasia   [] Vissual changes   [] Weakness or numbness in arm   [] Weakness or numbness in leg Musculoskeletal:   [] Joint swelling   [x] Joint pain   [x] Low back pain Hematologic:  [x] Easy bruising  [] Easy bleeding   [] Hypercoagulable state   [] Anemic Gastrointestinal:  [] Diarrhea   [] Vomiting  [] Gastroesophageal reflux/heartburn   [] Difficulty swallowing. Genitourinary:  [] Chronic kidney disease   [] Difficult urination  [] Frequent urination   [] Blood in urine Skin:  [] Rashes   [] Ulcers  Psychological:  [] History of anxiety   []  History of major depression.  Physical Examination  Vitals:   07/24/19 1612  BP: 110/72  Pulse: (!) 114  Resp: 18  Weight: 220 lb (99.8 kg)  Height: 5\' 5"  (1.651 m)   Body mass index is 36.61 kg/m. Gen: WD/WN, NAD Head: [place]/AT, No temporalis wasting.  Ear/Nose/Throat: Hearing grossly intact, nares w/o erythema or drainage Eyes: PER, EOMI, sclera nonicteric.  Neck: Supple, no large masses.   Pulmonary:  Good air movement, no audible wheezing bilaterally, no use of accessory muscles.  Cardiac: RRR, no JVD Vascular: scattered  varicosities present bilaterally.  Mild venous stasis changes to the legs bilaterally.  2+ soft pitting edema Vessel Right Left  Radial Palpable Palpable  Gastrointestinal: Non-distended. No guarding/no peritoneal signs.  Musculoskeletal: M/S 5/5 throughout.  No deformity or atrophy.  Neurologic: CN 2-12 intact. Symmetrical.  Speech is fluent. Motor exam as listed above. Psychiatric: Judgment intact, Mood & affect appropriate for pt's clinical situation. Dermatologic: No rashes or ulcers noted.  No changes consistent with cellulitis.   CBC Lab Results  Component Value Date   WBC 9.5 08/29/2018   HGB 13.5 08/29/2018   HCT 41.5 08/29/2018   MCV 91 08/29/2018   PLT 225 08/29/2018    BMET    Component Value Date/Time   NA 139 06/02/2019 1532   NA 136 01/23/2014 0406   K 4.4 06/02/2019 1532   K 4.4 01/23/2014 0406   CL 97 06/02/2019 1532   CL 100 01/23/2014 0406   CO2 25 06/02/2019 1532   CO2 30 01/23/2014 0406   GLUCOSE 216 (H) 06/02/2019 1532   GLUCOSE 254 (H) 02/13/2018 1521   GLUCOSE 270 (H) 01/23/2014 0406   BUN 32 (H) 06/02/2019 1532   BUN 26 (H) 01/23/2014 0406   CREATININE 0.98 06/02/2019 1532   CREATININE 0.94 01/23/2014 0406   CALCIUM 9.5 06/02/2019 1532   CALCIUM 8.7 01/23/2014 0406   GFRNONAA 53 (L) 06/02/2019 1532   GFRNONAA >60 01/23/2014 0406   GFRNONAA >60 05/07/2013 0535   GFRAA 61 06/02/2019 1532   GFRAA >60 01/23/2014 0406   GFRAA >60 05/07/2013 0535   CrCl cannot be calculated (Patient's most recent lab result is older than the maximum 21 days allowed.).  COAG Lab Results  Component Value Date   INR 2.4 07/08/2019   INR 2.1 06/10/2019   INR 1.5 (A) 05/27/2019    Radiology No results found.   Assessment/Plan 1. Chronic venous insufficiency No surgery or intervention at this point in time.  I have reviewed my discussion with the patient regarding venous insufficiency and why it causes symptoms. I have discussed with the patient the chronic  skin changes that accompany venous insufficiency and the long term sequela such as ulceration. Patient will contnue wearing  graduated compression stockings (if she can't wear the stockings I have stressed compression wraps and referred her to [facility]) on a daily basis, as this has provided excellent control of his edema. The patient will put the stockings on first thing in the morning and removing them in the evening. The patient is reminded not to sleep in the stockings.  In addition, behavioral modification including elevation during the day will be initiated. Exercise is strongly encouraged.  Duplex ultrasound of the bilateral lower extremity shows no reflux  Given the patient's good control and lack of any problems regarding the venous insufficiency and lymphedema a lymph pump in not need at this time.  The patient will follow up with me PRN should anything change.  The patient voices agreement with this plan.   2. Atherosclerosis of native coronary artery of native heart with stable angina pectoris ([place]) Continue cardiac and antihypertensive medications as already ordered and reviewed, no changes at this time.  Continue statin as ordered and reviewed, no changes at this time  Nitrates PRN for chest pain   3. Chronic atrial fibrillation (HCC) Continue antiarrhythmia medications as already ordered, these medications have been reviewed and there are no changes at this time.  Continue anticoagulation as ordered by Cardiology Service   4. Type 2 diabetes mellitus without complication, without long-term current use of insulin (HCC) Continue hypoglycemic medications as already ordered, these medications have been reviewed and there are no changes at this time.  Hgb A1C to be monitored as already arranged by primary service   5. Degeneration of lumbar or lumbosacral intervertebral disc Her symptoms do not match venous insufficiency and she has a history of LS spine disease.  Therefore I  will ask her to see [facility] neurosurgery  - Ambulatory referral to Neurosurgery    Hortencia Pilar, MD  07/27/2019 4:49 PM

## 2019-07-28 ENCOUNTER — Other Ambulatory Visit: Payer: Self-pay | Admitting: Family Medicine

## 2019-07-28 NOTE — Telephone Encounter (Signed)
Medication sent to patient's pharmacy.

## 2019-07-29 ENCOUNTER — Other Ambulatory Visit: Payer: Self-pay

## 2019-07-29 ENCOUNTER — Encounter: Payer: Self-pay | Admitting: Cardiovascular Disease

## 2019-07-29 ENCOUNTER — Ambulatory Visit: Payer: Medicare Other | Admitting: Cardiovascular Disease

## 2019-07-29 DIAGNOSIS — I25118 Atherosclerotic heart disease of native coronary artery with other forms of angina pectoris: Secondary | ICD-10-CM

## 2019-07-29 DIAGNOSIS — E785 Hyperlipidemia, unspecified: Secondary | ICD-10-CM | POA: Diagnosis not present

## 2019-07-29 DIAGNOSIS — G4733 Obstructive sleep apnea (adult) (pediatric): Secondary | ICD-10-CM

## 2019-07-29 DIAGNOSIS — E1159 Type 2 diabetes mellitus with other circulatory complications: Secondary | ICD-10-CM

## 2019-07-29 DIAGNOSIS — R0600 Dyspnea, unspecified: Secondary | ICD-10-CM | POA: Diagnosis not present

## 2019-07-29 DIAGNOSIS — Z794 Long term (current) use of insulin: Secondary | ICD-10-CM | POA: Diagnosis not present

## 2019-07-29 DIAGNOSIS — R0609 Other forms of dyspnea: Secondary | ICD-10-CM

## 2019-07-29 DIAGNOSIS — I5042 Chronic combined systolic (congestive) and diastolic (congestive) heart failure: Secondary | ICD-10-CM | POA: Diagnosis not present

## 2019-07-29 DIAGNOSIS — I482 Chronic atrial fibrillation, unspecified: Secondary | ICD-10-CM

## 2019-07-29 DIAGNOSIS — M7989 Other specified soft tissue disorders: Secondary | ICD-10-CM

## 2019-07-29 DIAGNOSIS — R5382 Chronic fatigue, unspecified: Secondary | ICD-10-CM

## 2019-07-29 NOTE — Patient Instructions (Addendum)
Labs :  BMP today  Medication Instructions:  No changes  If you need a refill on your cardiac medications before your next appointment, please call your pharmacy.   Lab work: No new labs needed   If you have labs (blood work) drawn today and your tests are completely normal, you will receive your results only by: Marland Kitchen MyChart Message (if you have MyChart) OR . A paper copy in the mail If you have any lab test that is abnormal or we need to change your treatment, we will call you to review the results.   Testing/Procedures: No new testing needed   Follow-Up: At [facility], you and your health needs are our priority.  As part of our continuing mission to provide you with exceptional heart care, we have created designated Provider Care Teams.  These Care Teams include your primary Cardiologist (physician) and Advanced Practice Providers (APPs -  Physician Assistants and Nurse Practitioners) who all work together to provide you with the care you need, when you need it.  . You will need a follow up appointment in 1 month   . Providers on your designated Care Team:   . Murray Hodgkins, NP . Christell Faith, PA-C . Marrianne Mood, PA-C  Any Other Special Instructions Will Be Listed Below (If Applicable).  For educational health videos Log in to : [email or web address] Or : [email or web address], password : [email or web address]

## 2019-07-30 LAB — BASIC METABOLIC PANEL
BUN/Creatinine Ratio: 33 — ABNORMAL HIGH (ref 12–28)
BUN: 26 mg/dL (ref 8–27)
CO2: 26 mmol/L (ref 20–29)
Calcium: 9.1 mg/dL (ref 8.7–10.3)
Chloride: 100 mmol/L (ref 96–106)
Creatinine, Ser: 0.79 mg/dL (ref 0.57–1.00)
GFR calc Af Amer: 79 mL/min/{1.73_m2} (ref >59)
GFR calc non Af Amer: 69 mL/min/{1.73_m2} (ref >59)
Glucose: 152 mg/dL — ABNORMAL HIGH (ref 65–99)
Potassium: 4.9 mmol/L (ref 3.5–5.2)
Sodium: 142 mmol/L (ref 134–144)

## 2019-07-30 NOTE — Addendum Note (Signed)
Addended by: Anselm Pancoast on: 07/30/2019 04:58 PM   Modules accepted: Orders

## 2019-07-31 ENCOUNTER — Telehealth (INDEPENDENT_AMBULATORY_CARE_PROVIDER_SITE_OTHER): Payer: Self-pay | Admitting: Vascular Surgery

## 2019-07-31 NOTE — Telephone Encounter (Signed)
Patient states she should have a referral sent for her spine to [facility] clinic. I checked the system and there is not a referral to [facility] clinic for this patient as she states. She would like to be informed of where the referral is being sent

## 2019-07-31 NOTE — Telephone Encounter (Signed)
Patient has been made aware referral has been placed for Dr Izora Ribas with [facility]

## 2019-07-31 NOTE — Addendum Note (Signed)
Addended by: Katha Cabal on: 07/31/2019 10:12 AM   Modules accepted: Orders

## 2019-08-06 ENCOUNTER — Telehealth: Payer: Self-pay | Admitting: Family Medicine

## 2019-08-06 NOTE — Telephone Encounter (Signed)
Copied from [place] [id number]. Topic: Medicare AWV >> Aug 06, 2019  1:51 PM Cher Nakai R wrote: Reason for CRM:  Left message for patient to call back and schedule Medicare Annual Wellness Visit (AWV) either virtually or in office.  Last AWV 03/14/2017  Please schedule at anytime with [facility] Health Advisor.

## 2019-08-07 ENCOUNTER — Telehealth (INDEPENDENT_AMBULATORY_CARE_PROVIDER_SITE_OTHER): Payer: Self-pay | Admitting: Vascular Surgery

## 2019-08-07 NOTE — Telephone Encounter (Signed)
Called stating that she found a pharmacy that will fill the prescription for compounded medicated cream she and GS talked about. The pharmacy is called [facility] in [place], [place] [address].  Phone number: [phone].

## 2019-08-07 NOTE — Telephone Encounter (Signed)
Patient was made aware that information has been giving to Dr Delana Meyer and he will send in the medicated cream sent into pharmacy

## 2019-08-08 ENCOUNTER — Other Ambulatory Visit: Payer: Self-pay | Admitting: Nurse Practitioner

## 2019-08-08 DIAGNOSIS — M5416 Radiculopathy, lumbar region: Secondary | ICD-10-CM

## 2019-08-08 DIAGNOSIS — M5441 Lumbago with sciatica, right side: Secondary | ICD-10-CM

## 2019-08-08 DIAGNOSIS — L03115 Cellulitis of right lower limb: Secondary | ICD-10-CM | POA: Diagnosis not present

## 2019-08-08 DIAGNOSIS — G8929 Other chronic pain: Secondary | ICD-10-CM | POA: Diagnosis not present

## 2019-08-08 DIAGNOSIS — M542 Cervicalgia: Secondary | ICD-10-CM | POA: Diagnosis not present

## 2019-08-08 DIAGNOSIS — M546 Pain in thoracic spine: Secondary | ICD-10-CM | POA: Diagnosis not present

## 2019-08-11 ENCOUNTER — Ambulatory Visit: Payer: Medicare Other | Admitting: Physician Assistant

## 2019-08-12 ENCOUNTER — Ambulatory Visit (INDEPENDENT_AMBULATORY_CARE_PROVIDER_SITE_OTHER): Payer: Medicare Other

## 2019-08-12 ENCOUNTER — Other Ambulatory Visit: Payer: Self-pay

## 2019-08-12 ENCOUNTER — Telehealth: Payer: Self-pay

## 2019-08-12 DIAGNOSIS — M4316 Spondylolisthesis, lumbar region: Secondary | ICD-10-CM | POA: Diagnosis not present

## 2019-08-12 DIAGNOSIS — I700 Atherosclerosis of aorta: Secondary | ICD-10-CM | POA: Diagnosis not present

## 2019-08-12 DIAGNOSIS — I482 Chronic atrial fibrillation, unspecified: Secondary | ICD-10-CM

## 2019-08-12 DIAGNOSIS — M542 Cervicalgia: Secondary | ICD-10-CM | POA: Diagnosis not present

## 2019-08-12 DIAGNOSIS — M532X6 Spinal instabilities, lumbar region: Secondary | ICD-10-CM | POA: Diagnosis not present

## 2019-08-12 DIAGNOSIS — M419 Scoliosis, unspecified: Secondary | ICD-10-CM | POA: Diagnosis not present

## 2019-08-12 DIAGNOSIS — M546 Pain in thoracic spine: Secondary | ICD-10-CM | POA: Diagnosis not present

## 2019-08-12 LAB — POCT INR
INR: 3.2 — AB (ref 2.0–3.0)
PT: 38.1

## 2019-08-12 NOTE — Telephone Encounter (Signed)
Pt was returning a CB from today but states that she had worked everything out today when she came in for her appt and that she is fine and sorry to bother.

## 2019-08-12 NOTE — Patient Instructions (Signed)
Description   Hold today then continue current dose. Alternate 3 mg and 4 mg  Recheck in four weeks.

## 2019-08-12 NOTE — Telephone Encounter (Signed)
Patient requesting labs to have antibody testing for covid.

## 2019-08-20 ENCOUNTER — Ambulatory Visit
Admission: RE | Admit: 2019-08-20 | Discharge: 2019-08-20 | Disposition: A | Discharge status: Home / Self Care | Payer: Medicare Other | Source: Ambulatory Visit | Attending: Nurse Practitioner | Admitting: Nurse Practitioner

## 2019-08-20 ENCOUNTER — Other Ambulatory Visit: Payer: Self-pay

## 2019-08-20 DIAGNOSIS — M5126 Other intervertebral disc displacement, lumbar region: Secondary | ICD-10-CM | POA: Diagnosis not present

## 2019-08-20 DIAGNOSIS — M47816 Spondylosis without myelopathy or radiculopathy, lumbar region: Secondary | ICD-10-CM | POA: Diagnosis not present

## 2019-08-20 DIAGNOSIS — M4316 Spondylolisthesis, lumbar region: Secondary | ICD-10-CM | POA: Diagnosis not present

## 2019-08-20 DIAGNOSIS — G8929 Other chronic pain: Secondary | ICD-10-CM | POA: Insufficient documentation

## 2019-08-20 DIAGNOSIS — M5442 Lumbago with sciatica, left side: Secondary | ICD-10-CM | POA: Insufficient documentation

## 2019-08-20 DIAGNOSIS — M48061 Spinal stenosis, lumbar region without neurogenic claudication: Secondary | ICD-10-CM | POA: Diagnosis not present

## 2019-08-20 DIAGNOSIS — M5416 Radiculopathy, lumbar region: Secondary | ICD-10-CM | POA: Insufficient documentation

## 2019-08-20 DIAGNOSIS — M5441 Lumbago with sciatica, right side: Secondary | ICD-10-CM | POA: Insufficient documentation

## 2019-08-21 ENCOUNTER — Telehealth: Payer: Self-pay | Admitting: Cardiovascular Disease

## 2019-08-21 NOTE — Progress Notes (Signed)
Established patient visit  I,April Miller,acting as a scribe for Wilhemena Durie, MD.,have documented all relevant documentation on the behalf of Wilhemena Durie, MD,as directed by  Wilhemena Durie, MD while in the presence of Wilhemena Durie, MD.   Patient: Tracy Mann   DOB: 1933-07-10   86 y.o. Female  MRN: [id number] Visit Date: 08/25/2019  Today's healthcare provider: Wilhemena Durie, MD   No chief complaint on file.  Subjective    HPI  Patient is here to discuss paperwork for pain clinic. Patient states she has a consultation with pain clinic 09/10/2019. Patient wants to discuss medications.  They will not give her any pain medicine on the first visit.  I will write her refills but then she will follow-up with them.  She really needs to cut back on her narcotic use.  I think there are other things that she can use to help with her pain.      Medications: Outpatient Medications Prior to Visit  Medication Sig   ACCU-CHEK AVIVA PLUS test strip USE WITH METER TO CHECK GLUCOSE BEFORE MEALS AND AT BEDTIME   estradiol (ESTRACE) 0.1 MG/GM vaginal cream Place 1 Applicatorful vaginally at bedtime.   ezetimibe (ZETIA) 10 MG tablet TAKE 1 TABLET BY MOUTH EVERY DAY   flavoxATE (URISPAS) 100 MG tablet Take 1 tablet (100 mg total) by mouth 2 (two) times daily as needed for bladder spasms.   fluticasone (FLONASE) 50 MCG/ACT nasal spray SPRAY TWO SPRAYS IN EACH NOSTRIL ONCE DAILY   HYDROcodone-acetaminophen (NORCO) 10-325 MG tablet Take 1 tablet by mouth every 6 (six) hours as needed.   insulin glargine (LANTUS SOLOSTAR) 100 UNIT/ML Solostar Pen Inject 20 units nightly between 8-10 PM. Adjust as directed. Max dose is 50 units daily.   insulin lispro (HUMALOG KWIKPEN) 100 UNIT/ML KwikPen INJECT 10-20 UNITS THREE TIMES DAILY WITH EACH MEAL.   Insulin Pen Needle (RELION PEN NEEDLE 31G/8MM) 31G X 8 MM MISC Use with insulin pen three times daily   levofloxacin  (LEVAQUIN) 250 MG tablet Take 1 tablet (250 mg total) by mouth daily.   levothyroxine (SYNTHROID) 125 MCG tablet TAKE 1 TABLET (125 MCG TOTAL) BY MOUTH DAILY BEFORE BREAKFAST.   lisinopril (ZESTRIL) 2.5 MG tablet TAKE 1 TABLET BY MOUTH EVERYDAY AT BEDTIME   LORazepam (ATIVAN) 1 MG tablet TAKE 1 TABLET BY MOUTH TWICE A DAY AS NEEDED   Magnesium 500 MG CAPS Take 500 mg by mouth daily.    metolazone (ZAROXOLYN) 2.5 MG tablet Take 1 tablet (2.5 mg total) by mouth 3 (three) times a week. TAKE 30 minutes before Torsemide   nitroGLYCERIN (NITROSTAT) 0.4 MG SL tablet Place 1 tablet (0.4 mg total) under the tongue every 5 (five) minutes as needed for chest pain.   pantoprazole (PROTONIX) 20 MG tablet Take 1 tablet (20 mg total) by mouth daily.   phenazopyridine (PYRIDIUM) 200 MG tablet Take 1 tablet (200 mg total) by mouth 3 (three) times daily as needed for pain.   potassium chloride SA (KLOR-CON) 20 MEQ tablet Take 1 tablet (20 mEq total) by mouth as directed. Take 1 tablet daily (10mEq) and every other day with metolazone take 2 tablets   torsemide (DEMADEX) 20 MG tablet Take 2 tablets (40 mg total) by mouth daily.   Vitamin D, Ergocalciferol, (DRISDOL) 50000 units CAPS capsule Take 50,000 Units by mouth. Going to restart   warfarin (COUMADIN) 3 MG tablet TAKE 1 TABLET BY MOUTH DAILY  warfarin (COUMADIN) 4 MG tablet Take 1 tablet (4 mg total) by mouth daily.   warfarin (COUMADIN) 2 MG tablet Take 1 tablet (2 mg total) by mouth daily. (Patient not taking: Reported on 08/25/2019)   warfarin (COUMADIN) 2.5 MG tablet TAKE 1 TABLET BY MOUTH EVERY DAY (Patient not taking: Reported on 08/25/2019)   No facility-administered medications prior to visit.    Review of Systems  Constitutional: Negative for appetite change, chills, fatigue and fever.  Respiratory: Negative for chest tightness and shortness of breath.   Cardiovascular: Negative for chest pain and palpitations.  Gastrointestinal:  Negative for abdominal pain, nausea and vomiting.  Neurological: Negative for dizziness and weakness.       Objective    There were no vitals taken for this visit. Wt Readings from Last 3 Encounters:  08/25/19 (!) 226 lb (102.5 kg)  07/29/19 221 lb 8 oz (100.5 kg)  07/24/19 220 lb (99.8 kg)    Physical Exam Vitals reviewed.  Constitutional:      General: She is not in acute distress.    Appearance: She is well-developed. She is obese.  HENT:     Head: Normocephalic and atraumatic.     Right Ear: Hearing and external ear normal.     Left Ear: Hearing and external ear normal.     Nose: Nose normal.  Eyes:     General: Lids are normal. No scleral icterus.       Right eye: No discharge.        Left eye: No discharge.     Conjunctiva/sclera: Conjunctivae normal.  Neck:     Thyroid: No thyromegaly.  Cardiovascular:     Rate and Rhythm: Normal rate and regular rhythm.     Heart sounds: Normal heart sounds.  Pulmonary:     Effort: Pulmonary effort is normal. No respiratory distress.  Abdominal:     Palpations: Abdomen is soft.     Tenderness: There is no right CVA tenderness or left CVA tenderness.  Musculoskeletal:        General: Normal range of motion.     Comments: 1+ LE  edema.  Skin:    General: Skin is warm and dry.     Findings: No lesion.  Neurological:     General: No focal deficit present.     Mental Status: She is alert and oriented to person, place, and time. Mental status is at baseline.  Psychiatric:        Mood and Affect: Mood normal.        Speech: Speech normal.        Behavior: Behavior normal.        Thought Content: Thought content normal.        Judgment: Judgment normal.       No results found for any visits on 08/25/19.  Assessment & Plan     1. Chronic pain syndrome Refilled hydrocodone for 2 months.  She needs to follow-up with pain clinic about this.  I am not comfortable continuing this medication long-term for this  86 year old.  2. Class 2 severe obesity due to excess calories with serious comorbidity and body mass index (BMI) of 36.0 to 36.9 in adult [facility]) Arthritis and sleep apnea.  3. Mixed Ischemic/Nonischemic Cardiomyopathy Followed by cardiology  4. Chronic atrial fibrillation (HCC) On chronic Coumadin  5. Recurrent major depressive disorder, in partial remission ([place]) Clinically I think the patient could benefit from sling.  6. Degeneration of lumbar or lumbosacral intervertebral disc  7. Spinal stenosis of lumbosacral region   8. Primary osteoarthritis involving multiple joints  - HYDROcodone-acetaminophen (NORCO) 10-325 MG tablet; Take 1 tablet by mouth every 6 (six) hours as needed. For after August 25,2021.  Dispense: 120 tablet; Refill: 0  9. Obstructive sleep apnea On CPAP.   No follow-ups on file.         Doneen Ollinger Cranford Mon, MD  [facility] [phone] (phone) [phone] (fax)  [place]

## 2019-08-21 NOTE — Telephone Encounter (Signed)
Patient wants to know if appt should be delayed .  Patient states she is doing ok and wants to push out a few months .   Please advise

## 2019-08-21 NOTE — Telephone Encounter (Signed)
I spoke with patient who reports she received a confirmation call regarding appointment on 7/27. She reports she is doing well and is asking if this appointment is needed.  She is willing to come in if needed.  I advised her to keep appointment as Dr Rockey Situ wanted her to follow up in a month.  Patient will plan on being at appointment as scheduled.

## 2019-08-25 ENCOUNTER — Ambulatory Visit (INDEPENDENT_AMBULATORY_CARE_PROVIDER_SITE_OTHER): Payer: Medicare Other | Admitting: Family Medicine

## 2019-08-25 ENCOUNTER — Other Ambulatory Visit: Payer: Self-pay

## 2019-08-25 ENCOUNTER — Encounter: Payer: Self-pay | Admitting: Family Medicine

## 2019-08-25 VITALS — BP 109/66 | HR 88 | Temp 97.3°F | Resp 18 | Ht 66.0 in | Wt 226.0 lb

## 2019-08-25 DIAGNOSIS — I428 Other cardiomyopathies: Secondary | ICD-10-CM

## 2019-08-25 DIAGNOSIS — I482 Chronic atrial fibrillation, unspecified: Secondary | ICD-10-CM | POA: Diagnosis not present

## 2019-08-25 DIAGNOSIS — G894 Chronic pain syndrome: Secondary | ICD-10-CM

## 2019-08-25 DIAGNOSIS — M150 Primary generalized (osteo)arthritis: Secondary | ICD-10-CM

## 2019-08-25 DIAGNOSIS — M8949 Other hypertrophic osteoarthropathy, multiple sites: Secondary | ICD-10-CM

## 2019-08-25 DIAGNOSIS — M5137 Other intervertebral disc degeneration, lumbosacral region: Secondary | ICD-10-CM

## 2019-08-25 DIAGNOSIS — M159 Polyosteoarthritis, unspecified: Secondary | ICD-10-CM

## 2019-08-25 DIAGNOSIS — F3341 Major depressive disorder, recurrent, in partial remission: Secondary | ICD-10-CM

## 2019-08-25 DIAGNOSIS — M4807 Spinal stenosis, lumbosacral region: Secondary | ICD-10-CM

## 2019-08-25 DIAGNOSIS — M51379 Other intervertebral disc degeneration, lumbosacral region without mention of lumbar back pain or lower extremity pain: Secondary | ICD-10-CM

## 2019-08-25 DIAGNOSIS — G4733 Obstructive sleep apnea (adult) (pediatric): Secondary | ICD-10-CM

## 2019-08-25 DIAGNOSIS — Z6836 Body mass index (BMI) 36.0-36.9, adult: Secondary | ICD-10-CM

## 2019-08-25 DIAGNOSIS — E66812 Obesity, class 2: Secondary | ICD-10-CM

## 2019-08-25 MED ORDER — HYDROCODONE-ACETAMINOPHEN 10-325 MG PO TABS: 1.0000 | ORAL_TABLET | Freq: Four times a day (QID) | ORAL | 0 refills | Status: DC | PRN

## 2019-08-26 ENCOUNTER — Ambulatory Visit: Payer: Medicare Other | Admitting: Physician Assistant

## 2019-08-27 ENCOUNTER — Other Ambulatory Visit: Payer: Self-pay | Admitting: Family Medicine

## 2019-08-27 NOTE — Telephone Encounter (Signed)
Requested medication (s) are due for refill today:yes  Requested medication (s) are on the active medication list: yes  Last refill:  06/09/19  #60  1 refill  Future visit scheduled: yes  Notes to clinic:  Not delegated    Requested Prescriptions  Pending Prescriptions Disp Refills   LORazepam (ATIVAN) 1 MG tablet 60 tablet 1    Sig: Take 1 tablet (1 mg total) by mouth 2 (two) times daily as needed.      Not Delegated - Psychiatry:  Anxiolytics/Hypnotics Failed - 08/27/2019 11:39 AM      Failed - This refill cannot be delegated      Failed - Urine Drug Screen completed in last 360 days.      Passed - Valid encounter within last 6 months    Recent Outpatient Visits           2 days ago Mixed Ischemic/Nonischemic [place] Jerrol Banana., MD   2 months ago Type 2 diabetes mellitus without complication, without long-term current use of insulin [facility])   [facility] Jerrol Banana., MD   2 months ago Recurrent cystitis   [place], Wendee Beavers, [place]   3 months ago [place], Vickki Muff, [place]   4 months ago Yeast infection   [facility] Jerrol Banana., MD       Future Appointments             In 1 week Gilford Rile, Martie Lee, NP [facility], [facility]   In 3 weeks Jerrol Banana., MD [facility], [facility]

## 2019-08-27 NOTE — Telephone Encounter (Signed)
Medication Refill - Medication: LORazepam (ATIVAN) 1 MG tablet   Has the patient contacted their pharmacy? Yes.   (Agent: If no, request that the patient contact the pharmacy for the refill.) (Agent: If yes, when and what did the pharmacy advise?)  Preferred Pharmacy (with phone number or street name):  [id number] - [place], [place] [address]  [address] [place] [address]  Phone: [phone] Fax: [phone]     Agent: Please be advised that RX refills may take up to 3 business days. We ask that you follow-up with your pharmacy.

## 2019-09-01 MED ORDER — LORAZEPAM 1 MG PO TABS: 1.0000 mg | ORAL_TABLET | Freq: Two times a day (BID) | ORAL | 1 refills | Status: DC | PRN

## 2019-09-02 NOTE — Progress Notes (Signed)
Office Visit    Patient Name: Tracy Mann Date of Encounter: 09/03/2019  Primary Care Provider:  Jerrol Banana., MD Primary Cardiologist:  Ida Rogue, MD Electrophysiologist:  None   Chief Complaint    Tracy Mann is a 86 y.o. female with a hx of chronic atrial fibrillation on Coumadin, chronic combined systolic and diastolic heart failure, HTN, HLD, morbid obesity, moderate pulmonary hypertension, CAD s/p inferior wall MI 2007 felt to be secondary to thrombus from atrial fibrillation, lower extremity edema  presents today for follow up of diastolic heart failure.   Past Medical History    Past Medical History:  Diagnosis Date   Acute myocardial infarction of inferior wall ([place])    a. 10/2005 - Presumed to be 2/2 to a coronary embolus in setting of persistent Afib-->Cath 2014 relatively nl cors, EF 45-50% w/ severe distal inferior/apical inferior HK w/ aneursymal appearance.   Arthritis    Chronic anticoagulation    a. warfarin.   Chronic atrial fibrillation (HCC)    a. CHA2DS2VASc = 6--> warfarin.   Chronic combined systolic and diastolic CHF (congestive heart failure) ([place])    a. 10/2012 Echo: EF 35-40%, diff HK, mild MR, mild biatrial enlargement;  b. 11/2012 EF 45-50% by LV gram; c. echo 07/2014: EF 40-45%, mod conc LVH, mod MR, severe biatrial enlargement, PASP 45 mm Hg c. Echo 01/2018 EF 45-50%, duffuse hypokinesis, mild MR, LA mod dilated, norm RVSF, dilated IVC   Essential hypertension    Hyperlipidemia, mixed    Mixed Ischemic/Nonischemic Cardiomyopathy    a. 10/2012 Echo: EF 35-40%;  b. 11/2012 EF 45-50% by LV gram.   Nonobstructive coronary artery disease    a. 2007 inferior wall MI thought to be secondary to thrombus from atrial fib b. 2014 cath with R dominant coronary arterial system with mild luminal irregularities c. Myoview 05/2015 old inferior MI, no new ischemic changes   Obesity    Thyroid disease    hypothyroidism   Type II diabetes  mellitus ([place])    a. 07/2018 A1c 8.7 - long term insulin use   Venous insufficiency    Vitamin D deficiency    Past Surgical History:  Procedure Laterality Date   CARDIAC CATHETERIZATION  11/2012   [facility];EF 45-50%   CHOLECYSTECTOMY  1999   TUBAL LIGATION  1968   VESICOVAGINAL FISTULA CLOSURE W/ TAH  1996    Allergies  Allergies  Allergen Reactions   Other Anaphylaxis and Swelling    'tongue swelling'   Sulfa Antibiotics Anaphylaxis   Cephalexin     Blisters   Clarithromycin Other (See Comments)    Hives, headaches, hard time swelling, felt like throat was closing up Hives, headaches, hard time swelling, felt like throat was closing up   Diphenhydramine Other (See Comments)   Estrogens Conjugated    Estrogens, Conjugated Other (See Comments)   Sulfonamide Derivatives Swelling    'tongue swelling'   Nitrofurantoin Nausea Only    History of Present Illness    Tracy Mann is a 86 y.o. female with a hx of chronic atrial fibrillation on Coumadin, chronic combined systolic and diastolic heart failure, HTN, HLD, morbid obesity, moderate pulmonary hypertension, CAD s/p inferior wall MI 2007 felt to be secondary to thrombus from atrial fibrillation, lower extremity edema, DM2, arthralgias. She was last seen 07/29/19 by Dr. Rockey Situ.  Echo 01/2018 with LVEF 40-45%, diffuse ypokinesis, mild MR, moderately dilated LA, RV normal systolic pressure. She wore a ZIO monitor 07/2018  showing persistent atrial fib with variable rates. Noted pauses up to 4.2 seconds.    Telephone encounter 05/21/19 noting shortness of breath.Checked at home with O2 85% while ambulating and low 90's while at rest. Reported "legs heavy and hard as a rock". Reported she was never given CPAP despite hx of OSA.   At visit 04/28/19 noted to be up to 235lbs compared to previous visit a few months prior with weight 227lb. She is unable to wear compression hose. Has had reccurent difficulties with UTI and  urethral irritation which affect her willingness to take Lasix.   Seen 05/22/19 with dyspnea and fluid retention due to being unable to take Lasix as it irritated her interstitial cystitis. Her Lasix was increased to 40mg  daily. At clinic 05/26/19 Metolazone 2.5 mg every other day was added and she was switched to Torsemide. Seen 06/02/19 and encouraged to take her diuretics as prescribed as she was taking lower doses. Seen 07/29/19 with weight down 14 lbs in 2 months. Still with periodic inconsistency in taking her diuretic.  She was recommended continue torsemide 20 mg daily metolazone 2.5 mg every other day.  Labs 05/20/19 via [facility]  K 3.6, creatinine 0.7, GFR 80, A1c 8.3, vitamin D 29.4, TSH 1.01  Continues to take her fluid pill on an as-needed basis.  Tells me that she thinks that is less effective as she is not urinating as much.  We discussed with her she takes the fluid pill on a regular basis she will hopefully have less fluid to get rid of.  Weight at home routinely 219 to 222 pounds.  She has been taking her metolazone and torsemide twice per week with an additional torsemide another day throughout the week.  Again reviewed the importance of taking her medication as prescribed.  Reports no shortness of breath at rest.  Her dyspnea on exertion is stable at baseline.. Reports no chest pain, pressure, or tightness. No orthopnea, PND.  Reports intermittent edema for which she takes her torsemide.  Reports no palpitations.   She does have some issues with pain in her legs that is related to her spinal problems.  She has upcoming with appointment with pain management clinic as well as with physiatry.  No symptoms suggestive of claudication as her leg pain is continuous and not relieved by rest.  EKGs/Labs/Other Studies Reviewed:   The following studies were reviewed today:  Echo 02/15/18 Left ventricle: The cavity size was normal. Systolic function was    mildly to moderately reduced. The  estimated ejection fraction was    in the range of 40% to 45%. Diffuse hypokinesis. Regional wall    motion abnormalities cannot be excluded. The study is not    technically sufficient to allow evaluation of LV diastolic    function.  - Mitral valve: There was mild regurgitation.  - Left atrium: The atrium was moderately dilated.  - Right ventricle: Systolic function was normal.  - Inferior vena cava: The vessel was dilated. The respirophasic    diameter changes were blunted (< 50%), consistent with elevated    central venous pressure.   ZIO 08/30/18 Atrial fibrillation, 100% burden Rate ranging from 30-188 bpm (avg of 83 bpm).   12 Ventricular Tachycardia runs occurred, the run with the fastest interval lasting 12 beats with a max rate of 203 bpm, the longest lasting 8 beats with an avg rate of 133 bpm.    3 Pauses occurred, the longest lasting 4.2 secs (14 bpm). On 09/01/18: at  8:06 Am, 4.2 sec On 09/02/18 at 9:15 AM, 3.2 sec On 09/08/18 at 10:21 AM, 3.1 sec.    Isolated VEs were occasional (1.7%, [id number]), VE Couplets were rare (<1.0%, 2096), and VE Triplets were rare (<1.0%, 38). Ventricular Bigeminy and Trigeminy were present.  EKG:  EKG is ordered today.  The ekg ordered today demonstrates rate controlled atrial fibrillation 89 pm with nonspecific intraventricular block and poor R wave progression.   Recent Labs: 07/29/2019: BUN 26; Creatinine, Ser 0.79; Potassium 4.9; Sodium 142  Recent Lipid Panel    Component Value Date/Time   CHOL 172 01/17/2019 1127   TRIG 119 01/17/2019 1127   HDL 57 01/17/2019 1127   CHOLHDL 3.0 01/17/2019 1127   CHOLHDL 6.0 05/21/2015 0142   VLDL 45 (H) 05/21/2015 0142   LDLCALC 94 01/17/2019 1127    Home Medications   Current Meds  Medication Sig   ACCU-CHEK AVIVA PLUS test strip USE WITH METER TO CHECK GLUCOSE BEFORE MEALS AND AT BEDTIME   Cholecalciferol 25 MCG (1000 UT) tablet Take 3,000 Units by mouth daily.    estradiol (ESTRACE) 0.1 MG/GM  vaginal cream Place 1 Applicatorful vaginally at bedtime.   ezetimibe (ZETIA) 10 MG tablet TAKE 1 TABLET BY MOUTH EVERY DAY   flavoxATE (URISPAS) 100 MG tablet Take 1 tablet (100 mg total) by mouth 2 (two) times daily as needed for bladder spasms.   fluticasone (FLONASE) 50 MCG/ACT nasal spray SPRAY TWO SPRAYS IN EACH NOSTRIL ONCE DAILY   HYDROcodone-acetaminophen (NORCO) 10-325 MG tablet Take 1 tablet by mouth every 6 (six) hours as needed. For after August 25,2021.   insulin glargine (LANTUS SOLOSTAR) 100 UNIT/ML Solostar Pen Inject 20 units nightly between 8-10 PM. Adjust as directed. Max dose is 50 units daily.   insulin lispro (HUMALOG KWIKPEN) 100 UNIT/ML KwikPen INJECT 10-20 UNITS THREE TIMES DAILY WITH EACH MEAL.   Insulin Pen Needle (RELION PEN NEEDLE 31G/8MM) 31G X 8 MM MISC Use with insulin pen three times daily   levothyroxine (SYNTHROID) 125 MCG tablet TAKE 1 TABLET (125 MCG TOTAL) BY MOUTH DAILY BEFORE BREAKFAST.   lisinopril (ZESTRIL) 2.5 MG tablet TAKE 1 TABLET BY MOUTH EVERYDAY AT BEDTIME   LORazepam (ATIVAN) 1 MG tablet Take 1 tablet (1 mg total) by mouth 2 (two) times daily as needed.   Magnesium 500 MG CAPS Take 500 mg by mouth daily.    nitroGLYCERIN (NITROSTAT) 0.4 MG SL tablet Place 1 tablet (0.4 mg total) under the tongue every 5 (five) minutes as needed for chest pain.   pantoprazole (PROTONIX) 20 MG tablet Take 1 tablet (20 mg total) by mouth daily.   phenazopyridine (PYRIDIUM) 200 MG tablet Take 1 tablet (200 mg total) by mouth 3 (three) times daily as needed for pain.   potassium chloride SA (KLOR-CON) 20 MEQ tablet Take 1-2 tablets with metolazone   torsemide (DEMADEX) 20 MG tablet Take 1 tablet (20 mg total) by mouth daily.   Vitamin D, Ergocalciferol, (DRISDOL) 50000 units CAPS capsule Take 50,000 Units by mouth. Going to restart   warfarin (COUMADIN) 3 MG tablet Take 3 mg by mouth every other day.   warfarin (COUMADIN) 4 MG tablet Take 4 mg by  mouth every other day.   [DISCONTINUED] torsemide (DEMADEX) 20 MG tablet Take 2 tablets (40 mg total) by mouth daily.    Review of Systems   Review of Systems  Constitutional: Negative for chills, fever and malaise/fatigue.  Cardiovascular: Positive for dyspnea on exertion. Negative for chest pain,  leg swelling, near-syncope, orthopnea, palpitations and syncope.  Respiratory: Negative for cough, shortness of breath and wheezing.   Gastrointestinal: Negative for nausea and vomiting.  Neurological: Negative for dizziness, light-headedness and weakness.   All other systems reviewed and are otherwise negative except as noted above.  Physical Exam    VS:  BP 110/60 (BP Location: Left Arm, Patient Position: Sitting, Cuff Size: Large)    Pulse 78    Ht 5' 5.5" (1.664 m)    Wt 225 lb 8 oz (102.3 kg)    SpO2 93%    BMI 36.95 kg/m  , BMI Body mass index is 36.95 kg/m. GEN: Well nourished, overweight, well developed, in no acute distress. HEENT: normal. Neck: Supple, no JVD, carotid bruits, or masses. Cardiac: RRR, no murmurs, rubs, or gallops. No clubbing, cyanosis.  Radials/DP/PT 2+ and equal bilaterally.  Bilateral lower extremity with non pitting edema. Respiratory:  Respirations regular and unlabored.  Bilateral lower lobes with crackles.  Bilateral upper lobes with expiratory wheeze. GI: Soft, nontender, nondistended, BS + x 4. MS: No deformity or atrophy. Skin: Warm and dry, no rash.  Skin on bilateral lower extremities is mildly reddened and notably firm to touch. Neuro:  Strength and sensation are intact. Psych: Normal affect.  Assessment & Plan    1. HFrEF/DOE/LE edema -Echo 02/15/2018 with LVEF 40-45%, indeterminate LV diastolic parameters, mild MR, LA moderately dilated.  Her approximate dry weight per Dr. Rockey Situ is 210 lbs. She is down 1 lb compared to previous office visit. She takes her diuretic on a periodic basis. Reviewed importance of taking her Torsemide regularly. Encourage  to take Torsemide 20mg  daily and metolazone 2.5 mg twice per week.  Record daily weights.  Report weight gain of 3 pounds overnight or 5 pounds in 1 week.  Low-sodium, healthy diet encouraged.  Regular cardiovascular exercise encouraged.  2. HLD, LDL goal <70 -Lipid panel 01/17/2019 total cholesterol 172, HDL 57, triglycerides 119, LDL 94.  Continue Zetia 10 mg daily. Reported statin intolerance. Could consider transition to Nexlizet vs referral to lipid clinic for PCSK9i for LDL goal <70 in setting of CAD, but she politely declines today as she is overwhelmed with current medical appointments.   3. HTN -BP well controlled.  Continue present antihypertensive regimen including lisinopril 2.5 mg daily.  4. CAD -s/p inferior wall MI in 2007.  Lexiscan Myoview 2017 low risk study.  Reports no anginal symptoms. EKG today without acute ST/T wave changes.  GDMT includes Zetia.  No aspirin secondary to chronic anticoagulation.    5. Chronic atrial fibrillation/Chronic anticoagulation - Rate controlled on EKG today. Chronic anticoagulation for CHADS2VASc of at least 7 (agex2, gender, HTN, HF, DM2, CAD). Denies bleeding complications. Follows with coumadin clinic at her PCP.  6. DM2 - Established with Dr. Ancil Boozer of endocrinology. A1c 7.8 most recently.   Disposition: Follow up in 2 month(s) with Dr. Wallie Renshaw, NP 09/03/2019, 12:29 PM

## 2019-09-03 ENCOUNTER — Ambulatory Visit (INDEPENDENT_AMBULATORY_CARE_PROVIDER_SITE_OTHER): Payer: Medicare Other | Admitting: Family

## 2019-09-03 ENCOUNTER — Encounter: Payer: Self-pay | Admitting: Family

## 2019-09-03 ENCOUNTER — Other Ambulatory Visit: Payer: Self-pay

## 2019-09-03 VITALS — BP 110/60 | HR 78 | Ht 65.5 in | Wt 225.5 lb

## 2019-09-03 DIAGNOSIS — E785 Hyperlipidemia, unspecified: Secondary | ICD-10-CM | POA: Diagnosis not present

## 2019-09-03 DIAGNOSIS — Z7901 Long term (current) use of anticoagulants: Secondary | ICD-10-CM

## 2019-09-03 DIAGNOSIS — R0600 Dyspnea, unspecified: Secondary | ICD-10-CM

## 2019-09-03 DIAGNOSIS — I482 Chronic atrial fibrillation, unspecified: Secondary | ICD-10-CM | POA: Diagnosis not present

## 2019-09-03 DIAGNOSIS — I25118 Atherosclerotic heart disease of native coronary artery with other forms of angina pectoris: Secondary | ICD-10-CM | POA: Diagnosis not present

## 2019-09-03 DIAGNOSIS — I10 Essential (primary) hypertension: Secondary | ICD-10-CM | POA: Diagnosis not present

## 2019-09-03 DIAGNOSIS — R0609 Other forms of dyspnea: Secondary | ICD-10-CM

## 2019-09-03 DIAGNOSIS — M7989 Other specified soft tissue disorders: Secondary | ICD-10-CM

## 2019-09-03 DIAGNOSIS — I5042 Chronic combined systolic (congestive) and diastolic (congestive) heart failure: Secondary | ICD-10-CM | POA: Diagnosis not present

## 2019-09-03 MED ORDER — TORSEMIDE 20 MG PO TABS: 20.0000 mg | ORAL_TABLET | Freq: Every day | ORAL | 1 refills | Status: DC

## 2019-09-03 NOTE — Patient Instructions (Addendum)
Medication Instructions:  Your physician has recommended you make the following change in your medication:   Recommend you taking your fluid pills as prescribed. Recommend keeping the fluid pill in your system consistently as it will allow for you to keep the fluid off rather than for your fluid level to fluctuate. We often see that you urinate less as you're on the fluid pill consistently as there is less fluid to get off.   Take Metolazone 2.5mg  twice per week on Mondays and Fridays. Take 30 minutes prior to your Torsemide.   Take Torsemide 20mg  (one tablet) daily.  Our goal is for your weight to be approximately 210 pounds, but it will take some time for [place] to get your fluid down to that level.  *If you need a refill on your cardiac medications before your next appointment, please call your pharmacy*  Lab Work: No lab work today.   Testing/Procedures: Your EKG today shows rate controlled atrial fibrillation which is a stable finding.   Follow-Up: At [facility], you and your health needs are our priority.  As part of our continuing mission to provide you with exceptional heart care, we have created designated Provider Care Teams.  These Care Teams include your primary Cardiologist (physician) and Advanced Practice Providers (APPs -  Physician Assistants and Nurse Practitioners) who all work together to provide you with the care you need, when you need it.  We recommend signing up for the patient portal called "MyChart".  Sign up information is provided on this After Visit Summary.  MyChart is used to connect with patients for Virtual Visits (Telemedicine).  Patients are able to view lab/test results, encounter notes, upcoming appointments, etc.  Non-urgent messages can be sent to your provider as well.   To learn more about what you can do with MyChart, go to [email or web address].    Your next appointment:   2 month(s)  The format for your next appointment:   In  Person  Provider:   You may see Ida Rogue, MD or one of the following Advanced Practice Providers on your designated Care Team:    Murray Hodgkins, NP  Christell Faith, PA-C  Marrianne Mood, PA-C  Laurann [place], NP  Other Instructions   DASH Eating Plan DASH stands for "Dietary Approaches to Stop Hypertension." The DASH eating plan is a healthy eating plan that has been shown to reduce high blood pressure (hypertension). It may also reduce your risk for type 2 diabetes, heart disease, and stroke. The DASH eating plan may also help with weight loss. What are tips for following this plan?  General guidelines  Avoid eating more than 2,300 mg (milligrams) of salt (sodium) a day. If you have hypertension, you may need to reduce your sodium intake to 1,500 mg a day.  Limit alcohol intake to no more than 1 drink a day for nonpregnant women and 2 drinks a day for men. One drink equals 12 oz of beer, 5 oz of wine, or 1 oz of hard liquor.  Work with your health care provider to maintain a healthy body weight or to lose weight. Ask what an ideal weight is for you.  Get at least 30 minutes of exercise that causes your heart to beat faster (aerobic exercise) most days of the week. Activities may include walking, swimming, or biking.  Work with your health care provider or diet and nutrition specialist (dietitian) to adjust your eating plan to your individual calorie needs. Reading food labels   Check  food labels for the amount of sodium per serving. Choose foods with less than 5 percent of the Daily Value of sodium. Generally, foods with less than 300 mg of sodium per serving fit into this eating plan.  To find whole grains, look for the word "whole" as the first word in the ingredient list. Shopping  Buy products labeled as "low-sodium" or "no salt added."  Buy fresh foods. Avoid canned foods and premade or frozen meals. Cooking  Avoid adding salt when cooking. Use salt-free  seasonings or herbs instead of table salt or sea salt. Check with your health care provider or pharmacist before using salt substitutes.  Do not fry foods. Cook foods using healthy methods such as baking, boiling, grilling, and broiling instead.  Cook with heart-healthy oils, such as olive, canola, soybean, or sunflower oil. Meal planning  Eat a balanced diet that includes: ? 5 or more servings of fruits and vegetables each day. At each meal, try to fill half of your plate with fruits and vegetables. ? Up to 6-8 servings of whole grains each day. ? Less than 6 oz of lean meat, poultry, or fish each day. A 3-oz serving of meat is about the same size as a deck of cards. One egg equals 1 oz. ? 2 servings of low-fat dairy each day. ? A serving of nuts, seeds, or beans 5 times each week. ? Heart-healthy fats. Healthy fats called Omega-3 fatty acids are found in foods such as flaxseeds and coldwater fish, like sardines, salmon, and mackerel.  Limit how much you eat of the following: ? Canned or prepackaged foods. ? Food that is high in trans fat, such as fried foods. ? Food that is high in saturated fat, such as fatty meat. ? Sweets, desserts, sugary drinks, and other foods with added sugar. ? Full-fat dairy products.  Do not salt foods before eating.  Try to eat at least 2 vegetarian meals each week.  Eat more home-cooked food and less restaurant, buffet, and fast food.  When eating at a restaurant, ask that your food be prepared with less salt or no salt, if possible. What foods are recommended? The items listed may not be a complete list. Talk with your dietitian about what dietary choices are best for you. Grains Whole-grain or whole-wheat bread. Whole-grain or whole-wheat pasta. Brown rice. [place]. Bulgur. Whole-grain and low-sodium cereals. Pita bread. Low-fat, low-sodium crackers. Whole-wheat flour tortillas. Vegetables Fresh or frozen vegetables (raw, steamed, roasted, or  grilled). Low-sodium or reduced-sodium tomato and vegetable juice. Low-sodium or reduced-sodium tomato sauce and tomato paste. Low-sodium or reduced-sodium canned vegetables. Fruits All fresh, dried, or frozen fruit. Canned fruit in natural juice (without added sugar). Meat and other protein foods Skinless chicken or [place]. Ground chicken or [place]. Pork with fat trimmed off. Fish and seafood. Egg whites. Dried beans, peas, or lentils. Unsalted nuts, nut butters, and seeds. Unsalted canned beans. Lean cuts of beef with fat trimmed off. Low-sodium, lean deli meat. Dairy Low-fat (1%) or fat-free (skim) milk. Fat-free, low-fat, or reduced-fat cheeses. Nonfat, low-sodium ricotta or cottage cheese. Low-fat or nonfat yogurt. Low-fat, low-sodium cheese. Fats and oils Soft margarine without trans fats. Vegetable oil. Low-fat, reduced-fat, or light mayonnaise and salad dressings (reduced-sodium). Canola, safflower, olive, soybean, and sunflower oils. Avocado. Seasoning and other foods Herbs. Spices. Seasoning mixes without salt. Unsalted popcorn and pretzels. Fat-free sweets. What foods are not recommended? The items listed may not be a complete list. Talk with your dietitian about what  dietary choices are best for you. Grains Baked goods made with fat, such as croissants, muffins, or some breads. Dry pasta or rice meal packs. Vegetables Creamed or fried vegetables. Vegetables in a cheese sauce. Regular canned vegetables (not low-sodium or reduced-sodium). Regular canned tomato sauce and paste (not low-sodium or reduced-sodium). Regular tomato and vegetable juice (not low-sodium or reduced-sodium). Angie Fava. Olives. Fruits Canned fruit in a light or heavy syrup. Fried fruit. Fruit in cream or butter sauce. Meat and other protein foods Fatty cuts of meat. Ribs. Fried meat. Berniece Salines. Sausage. Bologna and other processed lunch meats. Salami. Fatback. Hotdogs. Bratwurst. Salted nuts and seeds. Canned beans with  added salt. Canned or smoked fish. Whole eggs or egg yolks. Chicken or [place] with skin. Dairy Whole or 2% milk, cream, and half-and-half. Whole or full-fat cream cheese. Whole-fat or sweetened yogurt. Full-fat cheese. Nondairy creamers. Whipped toppings. Processed cheese and cheese spreads. Fats and oils Butter. Stick margarine. Lard. Shortening. Ghee. Bacon fat. Tropical oils, such as coconut, palm kernel, or palm oil. Seasoning and other foods Salted popcorn and pretzels. Onion salt, garlic salt, seasoned salt, table salt, and sea salt. Worcestershire sauce. Tartar sauce. Barbecue sauce. Teriyaki sauce. Soy sauce, including reduced-sodium. Steak sauce. Canned and packaged gravies. Fish sauce. Oyster sauce. Cocktail sauce. Horseradish that you find on the shelf. Ketchup. Mustard. Meat flavorings and tenderizers. Bouillon cubes. Hot sauce and Tabasco sauce. Premade or packaged marinades. Premade or packaged taco seasonings. Relishes. Regular salad dressings. Where to find more information:  National Heart, Lung, and [place]: https://wilson-eaton.com/  American Heart Association: www.heart.org Summary  The DASH eating plan is a healthy eating plan that has been shown to reduce high blood pressure (hypertension). It may also reduce your risk for type 2 diabetes, heart disease, and stroke.  With the DASH eating plan, you should limit salt (sodium) intake to 2,300 mg a day. If you have hypertension, you may need to reduce your sodium intake to 1,500 mg a day.  When on the DASH eating plan, aim to eat more fresh fruits and vegetables, whole grains, lean proteins, low-fat dairy, and heart-healthy fats.  Work with your health care provider or diet and nutrition specialist (dietitian) to adjust your eating plan to your individual calorie needs. This information is not intended to replace advice given to you by your health care provider. Make sure you discuss any questions you have with your health care  provider. Document Revised: 12/29/2016 Document Reviewed: 01/10/2016 Elsevier Patient Education  2020 [employer].

## 2019-09-05 ENCOUNTER — Telehealth: Payer: Self-pay | Admitting: Cardiovascular Disease

## 2019-09-05 DIAGNOSIS — Z7901 Long term (current) use of anticoagulants: Secondary | ICD-10-CM | POA: Diagnosis not present

## 2019-09-05 DIAGNOSIS — M5116 Intervertebral disc disorders with radiculopathy, lumbar region: Secondary | ICD-10-CM | POA: Diagnosis not present

## 2019-09-05 DIAGNOSIS — M48062 Spinal stenosis, lumbar region with neurogenic claudication: Secondary | ICD-10-CM | POA: Diagnosis not present

## 2019-09-05 DIAGNOSIS — M5442 Lumbago with sciatica, left side: Secondary | ICD-10-CM | POA: Diagnosis not present

## 2019-09-05 DIAGNOSIS — E119 Type 2 diabetes mellitus without complications: Secondary | ICD-10-CM | POA: Diagnosis not present

## 2019-09-05 NOTE — Telephone Encounter (Signed)
Patietn calling in talking about how she is being treated for pain management. Dr. Alba Destine recommend patient get a spinal injection, unclear if it is a steroid of not. Patient is now very concerned that it might a steroid and that it could cause her to retain fluid. She is just very nervous right now. Patient is calling because Dr. Alba Destine' office is closed at this time. Patient wants to know if it is wise to get a steroid with the potential heart effects.  Please advise patient. She does not want Dr. Alba Destine' office to be called at this point

## 2019-09-05 NOTE — Telephone Encounter (Signed)
Spoke with patient and stated that she was upset about possible injection and what they are going to do. Advised that she should call them first thing Monday to review her many questions. Recommended that she write down all of her questions so that she can have them answer each one of her concerns. She verbalized understanding with no further questions at this time.

## 2019-09-09 ENCOUNTER — Ambulatory Visit: Payer: Medicare Other

## 2019-09-09 ENCOUNTER — Other Ambulatory Visit: Payer: Self-pay

## 2019-09-09 DIAGNOSIS — I482 Chronic atrial fibrillation, unspecified: Secondary | ICD-10-CM

## 2019-09-09 LAB — POCT INR
INR: 2.3 (ref 2.0–3.0)
Prothrombin Time: 28.2

## 2019-09-10 ENCOUNTER — Ambulatory Visit: Payer: Medicare Other | Admitting: Pain Medicine

## 2019-09-11 DIAGNOSIS — I700 Atherosclerosis of aorta: Secondary | ICD-10-CM | POA: Diagnosis not present

## 2019-09-12 DIAGNOSIS — M5442 Lumbago with sciatica, left side: Secondary | ICD-10-CM | POA: Diagnosis not present

## 2019-09-12 DIAGNOSIS — E119 Type 2 diabetes mellitus without complications: Secondary | ICD-10-CM | POA: Diagnosis not present

## 2019-09-12 DIAGNOSIS — M5441 Lumbago with sciatica, right side: Secondary | ICD-10-CM | POA: Diagnosis not present

## 2019-09-16 NOTE — Progress Notes (Deleted)
Established patient visit   Patient: Tracy Mann   DOB: [birth year]   86 y.o. Female  MRN: [id number] Visit Date: 09/17/2019  Today's healthcare provider: Wilhemena Durie, MD   No chief complaint on file.  Subjective    HPI  Diabetes Mellitus Type II, follow-up  Lab Results  Component Value Date   HGBA1C 7.8 (A) 06/17/2019   HGBA1C 9.1 (H) 01/17/2019   HGBA1C 8.7 (A) 08/13/2018   Last seen for diabetes 3 months ago.  Management since then includes; Patient has an appointment with endocrinology.  A1c is down to 7.8 today after being 9.1 on last check.  That she could potentially benefit from [place] if she could afford it and would take it. She reports {excellent/good/fair/poor:19665} compliance with treatment. She {is/is not:21021397} having side effects. {document side effects if present:1}  Home blood sugar records: {diabetes glucometry results:16657}  Episodes of hypoglycemia? {Yes/No:20286} {enter details if yes:1}   Current insulin regiment: {***Type 'None' if not taking insulin                                                otherwise enter complete                                                 details of insulin regiment:1} Most Recent Eye Exam: ***  --------------------------------------------------------------------------------------------------- Hypertension, follow-up  BP Readings from Last 3 Encounters:  09/03/19 110/60  08/25/19 109/66  07/29/19 120/60   Wt Readings from Last 3 Encounters:  09/03/19 225 lb 8 oz (102.3 kg)  08/25/19 (!) 226 lb (102.5 kg)  07/29/19 221 lb 8 oz (100.5 kg)     She was last seen for hypertension 3 months ago.  BP at that visit was 115/73. Management since that visit includes; on lisinopril. She reports {excellent/good/fair/poor:19665} compliance with treatment. She {is/is not:9024} having side effects. {document side effects if present:1} She {is/is not:9024} exercising. She {is/is not:9024} adherent to low  salt diet.   Outside blood pressures are {enter patient reported home BP, or 'not being checked':1}.  She {does/does not:200015} smoke.  Use of agents associated with hypertension: {bp agents assoc with hypertension:511::"none"}.   --------------------------------------------------------------------------------------------------- Depression, Follow-up  She  was last seen for this 3 weeks ago. Changes made at last visit include; Clinically I think the patient could benefit from sling.   She reports {excellent/good/fair/poor:19665} compliance with treatment. She {is/is not:21021397} having side effects. ***  She reports {DESC; GOOD/FAIR/POOR:18685} tolerance of treatment. Current symptoms include: {Symptoms; depression:1002} She feels she is {improved/worse/unchanged:3041574} since last visit.  Depression screen [facility] 2/9 04/30/2018 03/19/2018 09/19/2017  Decreased Interest 0 0 0  Down, Depressed, Hopeless 0 0 0  PHQ - 2 Score 0 0 0  Altered sleeping - - 1  Tired, decreased energy - - 0  Change in appetite - - 2  Feeling bad or failure about yourself  - - 0  Trouble concentrating - - 0  Moving slowly or fidgety/restless - - 0  Suicidal thoughts - - 0  PHQ-9 Score - - 3  Difficult doing work/chores - - Not difficult at all  Some recent data might be hidden    -----------------------------------------------------------------------------------------  Chronic atrial fibrillation ([place])  From 08/25/2019-On chronic Coumadin.  {Show patient history (optional):23778::" "}   Medications: Outpatient Medications Prior to Visit  Medication Sig   ACCU-CHEK AVIVA PLUS test strip USE WITH METER TO CHECK GLUCOSE BEFORE MEALS AND AT BEDTIME   Cholecalciferol 25 MCG (1000 UT) tablet Take 3,000 Units by mouth daily.    estradiol (ESTRACE) 0.1 MG/GM vaginal cream Place 1 Applicatorful vaginally at bedtime.   ezetimibe (ZETIA) 10 MG tablet TAKE 1 TABLET BY MOUTH EVERY DAY   flavoxATE (URISPAS)  100 MG tablet Take 1 tablet (100 mg total) by mouth 2 (two) times daily as needed for bladder spasms.   fluticasone (FLONASE) 50 MCG/ACT nasal spray SPRAY TWO SPRAYS IN EACH NOSTRIL ONCE DAILY   HYDROcodone-acetaminophen (NORCO) 10-325 MG tablet Take 1 tablet by mouth every 6 (six) hours as needed. For after August 25,2021.   insulin glargine (LANTUS SOLOSTAR) 100 UNIT/ML Solostar Pen Inject 20 units nightly between 8-10 PM. Adjust as directed. Max dose is 50 units daily.   insulin lispro (HUMALOG KWIKPEN) 100 UNIT/ML KwikPen INJECT 10-20 UNITS THREE TIMES DAILY WITH EACH MEAL.   Insulin Pen Needle (RELION PEN NEEDLE 31G/8MM) 31G X 8 MM MISC Use with insulin pen three times daily   levothyroxine (SYNTHROID) 125 MCG tablet TAKE 1 TABLET (125 MCG TOTAL) BY MOUTH DAILY BEFORE BREAKFAST.   lisinopril (ZESTRIL) 2.5 MG tablet TAKE 1 TABLET BY MOUTH EVERYDAY AT BEDTIME   LORazepam (ATIVAN) 1 MG tablet Take 1 tablet (1 mg total) by mouth 2 (two) times daily as needed.   Magnesium 500 MG CAPS Take 500 mg by mouth daily.    metolazone (ZAROXOLYN) 2.5 MG tablet Take 1 tablet (2.5 mg total) by mouth 3 (three) times a week. TAKE 30 minutes before Torsemide   nitroGLYCERIN (NITROSTAT) 0.4 MG SL tablet Place 1 tablet (0.4 mg total) under the tongue every 5 (five) minutes as needed for chest pain.   pantoprazole (PROTONIX) 20 MG tablet Take 1 tablet (20 mg total) by mouth daily.   phenazopyridine (PYRIDIUM) 200 MG tablet Take 1 tablet (200 mg total) by mouth 3 (three) times daily as needed for pain.   potassium chloride SA (KLOR-CON) 20 MEQ tablet Take 1-2 tablets with metolazone   torsemide (DEMADEX) 20 MG tablet Take 1 tablet (20 mg total) by mouth daily.   Vitamin D, Ergocalciferol, (DRISDOL) 50000 units CAPS capsule Take 50,000 Units by mouth. Going to restart   warfarin (COUMADIN) 3 MG tablet Take 3 mg by mouth every other day.   warfarin (COUMADIN) 4 MG tablet Take 4 mg by mouth every other  day.   No facility-administered medications prior to visit.    Review of Systems  Constitutional: Negative for appetite change, chills, fatigue and fever.  Respiratory: Negative for chest tightness and shortness of breath.   Cardiovascular: Negative for chest pain and palpitations.  Gastrointestinal: Negative for abdominal pain, nausea and vomiting.  Neurological: Negative for dizziness and weakness.    {Heme   Chem   Endocrine   Serology   Results Review (optional):23779::" "}  Objective    There were no vitals taken for this visit. {Show previous vital signs (optional):23777::" "}  Physical Exam  ***  No results found for any visits on 09/17/19.  Assessment & Plan     ***  No follow-ups on file.      {provider attestation***:1}   Wilhemena Durie, MD  [facility] [phone] (phone) [phone] (fax)  [place]

## 2019-09-17 ENCOUNTER — Ambulatory Visit: Payer: Self-pay | Admitting: Family Medicine

## 2019-09-18 ENCOUNTER — Other Ambulatory Visit: Payer: Self-pay | Admitting: Family Medicine

## 2019-09-18 DIAGNOSIS — E119 Type 2 diabetes mellitus without complications: Secondary | ICD-10-CM

## 2019-09-19 NOTE — Progress Notes (Signed)
I,Tracy Mann,acting as a scribe for Wilhemena Durie, MD.,have documented all relevant documentation on the behalf of Wilhemena Durie, MD,as directed by  Wilhemena Durie, MD while in the presence of Wilhemena Durie, MD.   Established patient visit   Patient: Tracy Mann   DOB: Mar 09, 1933   86 y.o. Female  MRN: [id number] Visit Date: 09/22/2019  Today's healthcare provider: Wilhemena Durie, MD   Chief Complaint  Patient presents with  . Diabetes  . Follow-up   Subjective    HPI  Patient is seen in physiatry, Dr. Lucillie Garfinkel is at [facility], she recently had an injection for her back.  Might of helped a little bit.  She has intentionally cut back on her pain medicine to twice a day after her last visit and has a topical medication which we will put in the chart that has topical nonsteroidal muscle relaxant and topical anesthetic in it. She wishes for [place] to follow diabetes instead of going to endocrinology.  She has 1 final appointment in the next month. Diabetes Mellitus Type II, follow-up  Lab Results  Component Value Date   HGBA1C 6.8 (A) 09/22/2019   HGBA1C 7.8 (A) 06/17/2019   HGBA1C 9.1 (H) 01/17/2019   Last seen for diabetes 3 months ago.  Management since then includes; Patient has an appointment with endocrinology.  A1c is down to 7.8 today after being 9.1 on last check.  That she could potentially benefit from [place] if she could afford it and would take it. She reports good compliance with treatment. She is not having side effects. none  Home blood sugar records: fasting range: 400's  Episodes of hypoglycemia? No none   Current insulin regiment: Lantus, Humalog Most Recent Eye Exam: due  --------------------------------------------------------------------   Chronic diastolic heart failure ([place]) From 06/17/2019-Consider Farxiga clinically she is stable presently.  Chronic atrial fibrillation (HCC) From 08/25/2019-On Coumadin.  Anxiety,  generalized From 06/17/2019-Chronic ongoing problem.  Chronic pain syndrome From 08/25/2019-Refilled hydrocodone for 2 months.  She needs to follow-up with pain clinic about this.  I am not comfortable continuing this medication long-term for this 86 year old.  Recurrent major depressive disorder, in partial remission ([place]) From 08/25/2019-Clinically I think the patient could benefit from sling.  Primary osteoarthritis involving multiple joints From 08/25/2019-refilled HYDROcodone-acetaminophen (NORCO) 10-325 mg.   Past Medical History:  Diagnosis Date  . Acute myocardial infarction of inferior wall ([place])    a. 10/2005 - Presumed to be 2/2 to a coronary embolus in setting of persistent Afib-->Cath 2014 relatively nl cors, EF 45-50% w/ severe distal inferior/apical inferior HK w/ aneursymal appearance.  . Arthritis   . Chronic anticoagulation    a. warfarin.  . Chronic atrial fibrillation (HCC)    a. CHA2DS2VASc = 6--> warfarin.  . Chronic combined systolic and diastolic CHF (congestive heart failure) ([place])    a. 10/2012 Echo: EF 35-40%, diff HK, mild MR, mild biatrial enlargement;  b. 11/2012 EF 45-50% by LV gram; c. echo 07/2014: EF 40-45%, mod conc LVH, mod MR, severe biatrial enlargement, PASP 45 mm Hg c. Echo 01/2018 EF 45-50%, duffuse hypokinesis, mild MR, LA mod dilated, norm RVSF, dilated IVC  . Essential hypertension   . Hyperlipidemia, mixed   . Mixed Ischemic/Nonischemic Cardiomyopathy    a. 10/2012 Echo: EF 35-40%;  b. 11/2012 EF 45-50% by LV gram.  . Nonobstructive coronary artery disease    a. 2007 inferior wall MI thought to be secondary to thrombus from atrial fib b.  2014 cath with R dominant coronary arterial system with mild luminal irregularities c. Myoview 05/2015 old inferior MI, no new ischemic changes  . Obesity   . Thyroid disease    hypothyroidism  . Type II diabetes mellitus ([place])    a. 07/2018 A1c 8.7 - long term insulin use  . Venous insufficiency   .  Vitamin D deficiency        Medications: Outpatient Medications Prior to Visit  Medication Sig  . ACCU-CHEK AVIVA PLUS test strip USE WITH METER TO CHECK GLUCOSE BEFORE MEALS AND AT BEDTIME  . Cholecalciferol 25 MCG (1000 UT) tablet Take 3,000 Units by mouth daily.   Marland Kitchen estradiol (ESTRACE) 0.1 MG/GM vaginal cream Place 1 Applicatorful vaginally at bedtime.  Marland Kitchen ezetimibe (ZETIA) 10 MG tablet TAKE 1 TABLET BY MOUTH EVERY DAY  . flavoxATE (URISPAS) 100 MG tablet Take 1 tablet (100 mg total) by mouth 2 (two) times daily as needed for bladder spasms.  . fluticasone (FLONASE) 50 MCG/ACT nasal spray SPRAY TWO SPRAYS IN EACH NOSTRIL ONCE DAILY  . HYDROcodone-acetaminophen (NORCO) 10-325 MG tablet Take 1 tablet by mouth every 6 (six) hours as needed. For after August 25,2021.  . insulin glargine (LANTUS SOLOSTAR) 100 UNIT/ML Solostar Pen Inject 20 units nightly between 8-10 PM. Adjust as directed. Max dose is 50 units daily.  . insulin lispro (HUMALOG KWIKPEN) 100 UNIT/ML KwikPen INJECT 10-20 UNITS THREE TIMES DAILY WITH EACH MEAL.  Marland Kitchen Insulin Pen Needle (RELION PEN NEEDLE 31G/8MM) 31G X 8 MM MISC Use with insulin pen three times daily  . levothyroxine (SYNTHROID) 125 MCG tablet TAKE 1 TABLET (125 MCG TOTAL) BY MOUTH DAILY BEFORE BREAKFAST.  Marland Kitchen lisinopril (ZESTRIL) 2.5 MG tablet TAKE 1 TABLET BY MOUTH EVERYDAY AT BEDTIME  . LORazepam (ATIVAN) 1 MG tablet Take 1 tablet (1 mg total) by mouth 2 (two) times daily as needed.  . Magnesium 500 MG CAPS Take 500 mg by mouth daily.   . nitroGLYCERIN (NITROSTAT) 0.4 MG SL tablet Place 1 tablet (0.4 mg total) under the tongue every 5 (five) minutes as needed for chest pain.  . NONFORMULARY OR COMPOUNDED ITEM Diclofenac/Baclofen/Bu/Gabapentin Compounded Cream  . pantoprazole (PROTONIX) 20 MG tablet Take 1 tablet (20 mg total) by mouth daily.  . phenazopyridine (PYRIDIUM) 200 MG tablet Take 1 tablet (200 mg total) by mouth 3 (three) times daily as needed for pain.  .  potassium chloride SA (KLOR-CON) 20 MEQ tablet Take 1-2 tablets with metolazone  . torsemide (DEMADEX) 20 MG tablet Take 1 tablet (20 mg total) by mouth daily.  . Vitamin D, Ergocalciferol, (DRISDOL) 50000 units CAPS capsule Take 50,000 Units by mouth. Going to restart  . warfarin (COUMADIN) 3 MG tablet Take 3 mg by mouth every other day.  . warfarin (COUMADIN) 4 MG tablet Take 4 mg by mouth every other day.  . metolazone (ZAROXOLYN) 2.5 MG tablet Take 1 tablet (2.5 mg total) by mouth 3 (three) times a week. TAKE 30 minutes before Torsemide   No facility-administered medications prior to visit.    Review of Systems  Last hemoglobin A1c Lab Results  Component Value Date   HGBA1C 6.8 (A) 09/22/2019      Objective    BP 114/73 (BP Location: Left Arm, Patient Position: Sitting, Cuff Size: Large)   Pulse 88   Temp 98.5 F (36.9 C) (Oral)   Resp 18   Ht 5\' 6"  (1.676 m)   Wt 222 lb (100.7 kg)   SpO2 95%  BMI 35.83 kg/m  BP Readings from Last 3 Encounters:  09/22/19 114/73  09/03/19 110/60  08/25/19 109/66   Wt Readings from Last 3 Encounters:  09/22/19 222 lb (100.7 kg)  09/03/19 225 lb 8 oz (102.3 kg)  08/25/19 (!) 226 lb (102.5 kg)      Physical Exam  BP 114/73 (BP Location: Left Arm, Patient Position: Sitting, Cuff Size: Large)   Pulse 88   Temp 98.5 F (36.9 C) (Oral)   Resp 18   Ht 5\' 6"  (1.676 m)   Wt 222 lb (100.7 kg)   SpO2 95%   BMI 35.83 kg/m   General Appearance:    Alert, cooperative, no distress, appears stated age  Head:    Normocephalic, without obvious abnormality, atraumatic  Eyes:    PERRL, conjunctiva/corneas clear, EOM's intact, fundi    benign, both eyes  Ears:    Normal TM's and external ear canals, both ears  Nose:   Nares normal, septum midline, mucosa normal, no drainage    or sinus tenderness  Throat:   Lips, mucosa, and tongue normal; teeth and gums normal  Neck:   Supple, symmetrical, trachea midline, no adenopathy;    thyroid:  no  enlargement/tenderness/nodules; no carotid   bruit or JVD  Back:     Symmetric, no curvature, ROM normal, no CVA tenderness  Lungs:     Clear to auscultation bilaterally, respirations unlabored  Chest Wall:    No tenderness or deformity   Heart:    Regular rate and rhythm, S1 and S2 normal, no murmur, rub   or gallop  Breast Exam:    No tenderness, masses, or nipple abnormality  Abdomen:     Soft, non-tender, bowel sounds active all four quadrants,    no masses, no organomegaly  Genitalia:    Normal female without lesion, discharge or tenderness  Rectal:    Normal tone, normal prostate, no masses or tenderness;   guaiac negative stool  Extremities:   Extremities normal, atraumatic, no cyanosis or edema  Pulses:   2+ and symmetric all extremities  Skin:   Skin color, texture, turgor normal, no rashes or lesions  Lymph nodes:   Cervical, supraclavicular, and axillary nodes normal  Neurologic:   CNII-XII intact, normal strength, sensation and reflexes    throughout     Results for orders placed or performed in visit on 09/22/19  POCT glycosylated hemoglobin (Hb A1C)  Result Value Ref Range   Hemoglobin A1C 6.8 (A) 4.0 - 5.6 %   Est. average glucose Bld gHb Est-mCnc 148     Assessment & Plan     1. Type 2 diabetes mellitus without complication, without long-term current use of insulin (HCC) A1c mistakenly was done today and is 6.8.  She has follow-up with endocrinology.  She evidently wishes to follow-up here.  Goal A1c for this 86 year old is less than 7-7.5.  She has no documented hypoglycemia. - POCT glycosylated hemoglobin (Hb A1C)  2. Spinal stenosis of lumbosacral region New finding on MRI.  Has follow-up I think with surgery to discuss options. Now on twice daily Norco.  In the future we will try half of the twice daily Norco.  The topical seems to be helping some. 3. Chronic venous insufficiency Wear support hose  4. Mixed Ischemic/Nonischemic Cardiomyopathy Followed by  cardiology  5. Chronic atrial fibrillation (HCC) Chronic anticoagulation with Coumadin  6. Obstructive sleep apnea   7. Vitamin D deficiency   8. Statin intolerance   9.  Myalgia due to HMG CoA reductase inhibitor Patient statin intolerant according to her with myalgias.  She is on Zetia   Return in about 2 months (around 11/22/2019).         Lorre Opdahl Cranford Mon, MD  [facility] [phone] (phone) [phone] (fax)  [place]

## 2019-09-22 ENCOUNTER — Ambulatory Visit (INDEPENDENT_AMBULATORY_CARE_PROVIDER_SITE_OTHER): Payer: Medicare Other | Admitting: Family Medicine

## 2019-09-22 ENCOUNTER — Encounter: Payer: Self-pay | Admitting: Family Medicine

## 2019-09-22 ENCOUNTER — Other Ambulatory Visit: Payer: Self-pay

## 2019-09-22 VITALS — BP 114/73 | HR 88 | Temp 98.5°F | Resp 18 | Ht 66.0 in | Wt 222.0 lb

## 2019-09-22 DIAGNOSIS — I872 Venous insufficiency (chronic) (peripheral): Secondary | ICD-10-CM

## 2019-09-22 DIAGNOSIS — E559 Vitamin D deficiency, unspecified: Secondary | ICD-10-CM

## 2019-09-22 DIAGNOSIS — E119 Type 2 diabetes mellitus without complications: Secondary | ICD-10-CM

## 2019-09-22 DIAGNOSIS — I482 Chronic atrial fibrillation, unspecified: Secondary | ICD-10-CM | POA: Diagnosis not present

## 2019-09-22 DIAGNOSIS — Z789 Other specified health status: Secondary | ICD-10-CM

## 2019-09-22 DIAGNOSIS — M4807 Spinal stenosis, lumbosacral region: Secondary | ICD-10-CM | POA: Diagnosis not present

## 2019-09-22 DIAGNOSIS — I428 Other cardiomyopathies: Secondary | ICD-10-CM

## 2019-09-22 DIAGNOSIS — T466X5A Adverse effect of antihyperlipidemic and antiarteriosclerotic drugs, initial encounter: Secondary | ICD-10-CM

## 2019-09-22 DIAGNOSIS — G4733 Obstructive sleep apnea (adult) (pediatric): Secondary | ICD-10-CM

## 2019-09-22 DIAGNOSIS — M791 Myalgia, unspecified site: Secondary | ICD-10-CM

## 2019-09-22 LAB — POCT GLYCOSYLATED HEMOGLOBIN (HGB A1C)
Est. average glucose Bld gHb Est-mCnc: 148
Hemoglobin A1C: 6.8 % — AB (ref 4.0–5.6)

## 2019-09-30 DIAGNOSIS — M5441 Lumbago with sciatica, right side: Secondary | ICD-10-CM | POA: Diagnosis not present

## 2019-09-30 DIAGNOSIS — M5116 Intervertebral disc disorders with radiculopathy, lumbar region: Secondary | ICD-10-CM | POA: Diagnosis not present

## 2019-09-30 DIAGNOSIS — M48062 Spinal stenosis, lumbar region with neurogenic claudication: Secondary | ICD-10-CM | POA: Diagnosis not present

## 2019-09-30 DIAGNOSIS — G8929 Other chronic pain: Secondary | ICD-10-CM | POA: Diagnosis not present

## 2019-09-30 DIAGNOSIS — M5442 Lumbago with sciatica, left side: Secondary | ICD-10-CM | POA: Diagnosis not present

## 2019-10-02 ENCOUNTER — Telehealth: Payer: Self-pay

## 2019-10-02 NOTE — Telephone Encounter (Signed)
Copied from [place] [id number]. Topic: General - Other >> Oct 02, 2019  4:14 PM Leward Quan A wrote: Reason for CRM: Patient called to reschedule her appointment for Coumadin clinic but schedule took me to [facility] 10/15/19 if that is not an appropriate appointment please contact patient to change Ph# [phone]

## 2019-10-02 NOTE — Telephone Encounter (Signed)
Noted, patient was scheduled for the correct day.

## 2019-10-07 ENCOUNTER — Ambulatory Visit: Payer: Self-pay

## 2019-10-08 ENCOUNTER — Telehealth: Payer: Self-pay | Admitting: Family Medicine

## 2019-10-08 NOTE — Telephone Encounter (Signed)
Patient declined the Medicare Wellness Visit with [facility] - stated she does not feel the need to have [facility]VS. She feels Dr Rosanna Randy and her other physicians handle everything that need to be done for her without another visit with [facility].   Tried explain how it would be beneficial for her to let [facility] complete and that it could be by phone or in office and she still declined.

## 2019-10-13 NOTE — Progress Notes (Signed)
Established patient visit   Patient: Tracy Mann   DOB: 1933-12-30   86 y.o. Female  MRN: [id number] Visit Date: 10/15/2019  Today's healthcare provider: Wilhemena Durie, MD   Chief Complaint  Patient presents with   Atrial Fibrillation   joint pain   Subjective    HPI  Patient presents today wanting to discuss medication for her joint pain. She reports that she has been taking a compound cream and it has helped. She is wanting this to be increased. \ She has cut down on Norco from 4 daily to 2 daily.  I encouraged her to continue to cut back. She actually seems to be a more positive spirits last attitude since she has cut back. She is also due for her recheck PT/INR. --1.6 today She c/o worse pain with neuropathy.   Depression screen [facility] 2/9 10/15/2019 04/30/2018 03/19/2018  Decreased Interest 2 0 0  Down, Depressed, Hopeless 1 0 0  PHQ - 2 Score 3 0 0  Altered sleeping 3 - -  Tired, decreased energy 3 - -  Change in appetite 3 - -  Feeling bad or failure about yourself  0 - -  Trouble concentrating 1 - -  Moving slowly or fidgety/restless 1 - -  Suicidal thoughts 0 - -  PHQ-9 Score 14 - -  Difficult doing work/chores Somewhat difficult - -  Some recent data might be hidden    GAD 7 : Generalized Anxiety Score 10/15/2019  Nervous, Anxious, on Edge 3  Control/stop worrying 3  Worry too much - different things 3  Trouble relaxing 3  Restless 1  Easily annoyed or irritable 3  Afraid - awful might happen 3  Total GAD 7 Score 19  Anxiety Difficulty Somewhat difficult         Medications: Outpatient Medications Prior to Visit  Medication Sig   ACCU-CHEK AVIVA PLUS test strip USE WITH METER TO CHECK GLUCOSE BEFORE MEALS AND AT BEDTIME   Cholecalciferol 25 MCG (1000 UT) tablet Take 3,000 Units by mouth daily.    estradiol (ESTRACE) 0.1 MG/GM vaginal cream Place 1 Applicatorful vaginally at bedtime.   ezetimibe (ZETIA) 10 MG tablet TAKE 1 TABLET BY  MOUTH EVERY DAY   flavoxATE (URISPAS) 100 MG tablet Take 1 tablet (100 mg total) by mouth 2 (two) times daily as needed for bladder spasms.   fluticasone (FLONASE) 50 MCG/ACT nasal spray SPRAY TWO SPRAYS IN EACH NOSTRIL ONCE DAILY   HYDROcodone-acetaminophen (NORCO) 10-325 MG tablet Take 1 tablet by mouth every 6 (six) hours as needed. For after August 25,2021.   insulin glargine (LANTUS SOLOSTAR) 100 UNIT/ML Solostar Pen Inject 20 units nightly between 8-10 PM. Adjust as directed. Max dose is 50 units daily.   insulin lispro (HUMALOG KWIKPEN) 100 UNIT/ML KwikPen INJECT 10-20 UNITS THREE TIMES DAILY WITH EACH MEAL.   Insulin Pen Needle (RELION PEN NEEDLE 31G/8MM) 31G X 8 MM MISC Use with insulin pen three times daily   levothyroxine (SYNTHROID) 125 MCG tablet TAKE 1 TABLET (125 MCG TOTAL) BY MOUTH DAILY BEFORE BREAKFAST.   lisinopril (ZESTRIL) 2.5 MG tablet TAKE 1 TABLET BY MOUTH EVERYDAY AT BEDTIME   LORazepam (ATIVAN) 1 MG tablet Take 1 tablet (1 mg total) by mouth 2 (two) times daily as needed.   Magnesium 500 MG CAPS Take 500 mg by mouth daily.    nitroGLYCERIN (NITROSTAT) 0.4 MG SL tablet Place 1 tablet (0.4 mg total) under the tongue every 5 (five) minutes  as needed for chest pain.   NONFORMULARY OR COMPOUNDED ITEM Diclofenac/Baclofen/Bu/Gabapentin Compounded Cream   pantoprazole (PROTONIX) 20 MG tablet Take 1 tablet (20 mg total) by mouth daily.   phenazopyridine (PYRIDIUM) 200 MG tablet Take 1 tablet (200 mg total) by mouth 3 (three) times daily as needed for pain.   potassium chloride SA (KLOR-CON) 20 MEQ tablet Take 1-2 tablets with metolazone   torsemide (DEMADEX) 20 MG tablet Take 1 tablet (20 mg total) by mouth daily.   Vitamin D, Ergocalciferol, (DRISDOL) 50000 units CAPS capsule Take 50,000 Units by mouth. Going to restart   warfarin (COUMADIN) 3 MG tablet Take 3 mg by mouth every other day.   warfarin (COUMADIN) 4 MG tablet Take 4 mg by mouth every other day.    metolazone (ZAROXOLYN) 2.5 MG tablet Take 1 tablet (2.5 mg total) by mouth 3 (three) times a week. TAKE 30 minutes before Torsemide   No facility-administered medications prior to visit.    Review of Systems  Constitutional: Negative.   Respiratory: Negative.   Cardiovascular: Positive for leg swelling.  Endocrine: Negative.   Musculoskeletal: Positive for arthralgias, back pain, joint swelling and myalgias.  Neurological: Negative.   Psychiatric/Behavioral: The patient is nervous/anxious.        Objective    BP 130/74 (BP Location: Left Arm)    Pulse 78    Temp 98 F (36.7 C)    Ht 5' 5.5" (1.664 m)    Wt 227 lb 3.2 oz (103.1 kg)    BMI 37.23 kg/m  BP Readings from Last 3 Encounters:  10/15/19 130/74  09/22/19 114/73  09/03/19 110/60   Wt Readings from Last 3 Encounters:  10/15/19 227 lb 3.2 oz (103.1 kg)  09/22/19 222 lb (100.7 kg)  09/03/19 225 lb 8 oz (102.3 kg)      Physical Exam Vitals reviewed.  Constitutional:      General: She is not in acute distress.    Appearance: She is well-developed. She is obese.  HENT:     Head: Normocephalic and atraumatic.     Right Ear: Hearing and external ear normal.     Left Ear: Hearing and external ear normal.     Nose: Nose normal.  Eyes:     General: Lids are normal. No scleral icterus.       Right eye: No discharge.        Left eye: No discharge.     Conjunctiva/sclera: Conjunctivae normal.  Neck:     Thyroid: No thyromegaly.  Cardiovascular:     Rate and Rhythm: Normal rate and regular rhythm.     Heart sounds: Normal heart sounds.  Pulmonary:     Effort: Pulmonary effort is normal. No respiratory distress.  Abdominal:     Palpations: Abdomen is soft.     Tenderness: There is no right CVA tenderness or left CVA tenderness.  Musculoskeletal:        General: Normal range of motion.     Comments: 1+ LE  edema.  Skin:    General: Skin is warm and dry.     Findings: No lesion.  Neurological:     General: No  focal deficit present.     Mental Status: She is alert and oriented to person, place, and time. Mental status is at baseline.  Psychiatric:        Mood and Affect: Mood normal.        Speech: Speech normal.        Behavior:  Behavior normal.        Thought Content: Thought content normal.        Judgment: Judgment normal.       No results found for any visits on 10/15/19.  Assessment & Plan     1. Chronic pain syndrome We will talk to the pharmacist that compounds her topical treatment but this seemed to help.  Will increase the amount of anti-inflammatory and topical anesthetic in it.  2. Chronic atrial fibrillation (HCC) Recheck PT in 2 weeks.  Adjusted dose per today.  Dose is 4 mg daily - POCT INR  3. Recurrent major depressive disorder, in partial remission (HCC) Start Cymbalta 30 mg daily which should help depression anxiety and neuropathic pain - DULoxetine (CYMBALTA) 30 MG capsule; Take 1 capsule (30 mg total) by mouth daily.  Dispense: 30 capsule; Refill: 11  4. Anxiety, generalized  - DULoxetine (CYMBALTA) 30 MG capsule; Take 1 capsule (30 mg total) by mouth daily.  Dispense: 30 capsule; Refill: 11  5. Mixed Ischemic/Nonischemic Cardiomyopathy   6. Obstructive sleep apnea Patient advised to wear CPAP  7. Lymphedema   8. Class 2 severe obesity due to excess calories with serious comorbidity and body mass index (BMI) of 37.0 to 37.9 in adult [facility])    No follow-ups on file.      I, Wilhemena Durie, MD, have reviewed all documentation for this visit. The documentation on 10/20/19 for the exam, diagnosis, procedures, and orders are all accurate and complete.    Richard Cranford Mon, MD  [facility] [phone] (phone) [phone] (fax)  [place]

## 2019-10-15 ENCOUNTER — Other Ambulatory Visit: Payer: Self-pay

## 2019-10-15 ENCOUNTER — Ambulatory Visit: Payer: Self-pay

## 2019-10-15 ENCOUNTER — Encounter: Payer: Self-pay | Admitting: Family Medicine

## 2019-10-15 ENCOUNTER — Ambulatory Visit (INDEPENDENT_AMBULATORY_CARE_PROVIDER_SITE_OTHER): Payer: Medicare Other | Admitting: Family Medicine

## 2019-10-15 VITALS — BP 130/74 | HR 78 | Temp 98.0°F | Ht 65.5 in | Wt 227.2 lb

## 2019-10-15 DIAGNOSIS — G894 Chronic pain syndrome: Secondary | ICD-10-CM | POA: Diagnosis not present

## 2019-10-15 DIAGNOSIS — I428 Other cardiomyopathies: Secondary | ICD-10-CM | POA: Diagnosis not present

## 2019-10-15 DIAGNOSIS — Z6837 Body mass index (BMI) 37.0-37.9, adult: Secondary | ICD-10-CM

## 2019-10-15 DIAGNOSIS — I482 Chronic atrial fibrillation, unspecified: Secondary | ICD-10-CM

## 2019-10-15 DIAGNOSIS — G4733 Obstructive sleep apnea (adult) (pediatric): Secondary | ICD-10-CM

## 2019-10-15 DIAGNOSIS — I890 Lymphedema, not elsewhere classified: Secondary | ICD-10-CM

## 2019-10-15 DIAGNOSIS — F411 Generalized anxiety disorder: Secondary | ICD-10-CM | POA: Diagnosis not present

## 2019-10-15 DIAGNOSIS — F3341 Major depressive disorder, recurrent, in partial remission: Secondary | ICD-10-CM

## 2019-10-15 LAB — POCT INR
INR: 1.6 — AB (ref 2.0–3.0)
PT: 19.1

## 2019-10-15 MED ORDER — DULOXETINE HCL 30 MG PO CPEP: 30.0000 mg | ORAL_CAPSULE | Freq: Every day | ORAL | 11 refills | Status: DC

## 2019-10-15 NOTE — Patient Instructions (Signed)
Take Coumadin 4mg  daily.

## 2019-10-16 ENCOUNTER — Telehealth: Payer: Self-pay

## 2019-10-16 NOTE — Telephone Encounter (Signed)
Could be,try it with food and try to give it a week if nausea not too bad. A lot of times it will go away

## 2019-10-16 NOTE — Telephone Encounter (Signed)
Copied from [place] [id number]. Topic: General - Other >> Oct 16, 2019  9:49 AM Rainey Pines A wrote: Patient requesting callback from Dr. Marlan Palau nurse in regards to nausea side effect from new medication. Please advise

## 2019-10-16 NOTE — Telephone Encounter (Signed)
Is this a normal side effect from Cymbalta? Please advise.Thanks!

## 2019-10-16 NOTE — Telephone Encounter (Signed)
Spoke with patient and she reports that she is going to discontinue the medication. She feels that it makes her stomach "burn" and makes her nauseous and gives her headaches. She reports that she is going to do the Lorazepam instead.  Just FYI.

## 2019-10-24 ENCOUNTER — Other Ambulatory Visit: Payer: Self-pay | Admitting: Family Medicine

## 2019-10-24 NOTE — Telephone Encounter (Signed)
° °  Notes to clinic Historical provider   

## 2019-10-29 ENCOUNTER — Ambulatory Visit (INDEPENDENT_AMBULATORY_CARE_PROVIDER_SITE_OTHER): Payer: Medicare Other

## 2019-10-29 ENCOUNTER — Other Ambulatory Visit: Payer: Self-pay

## 2019-10-29 DIAGNOSIS — I482 Chronic atrial fibrillation, unspecified: Secondary | ICD-10-CM

## 2019-10-29 LAB — POCT INR
INR: 2.9 (ref 2.0–3.0)
Prothrombin Time: 35.3

## 2019-10-29 NOTE — Patient Instructions (Signed)
Description   Continue 4mg  daily ,return in 4 weeks

## 2019-10-31 ENCOUNTER — Other Ambulatory Visit: Payer: Self-pay

## 2019-10-31 ENCOUNTER — Encounter: Payer: Self-pay | Admitting: Family Medicine

## 2019-10-31 ENCOUNTER — Ambulatory Visit (INDEPENDENT_AMBULATORY_CARE_PROVIDER_SITE_OTHER): Payer: Medicare Other | Admitting: Family Medicine

## 2019-10-31 VITALS — BP 152/63 | HR 91 | Temp 98.8°F | Resp 16 | Wt 225.4 lb

## 2019-10-31 DIAGNOSIS — R300 Dysuria: Secondary | ICD-10-CM | POA: Diagnosis not present

## 2019-10-31 LAB — POCT URINALYSIS DIPSTICK
Glucose, UA: NEGATIVE
pH, UA: 5 (ref 5.0–8.0)

## 2019-10-31 MED ORDER — LEVOFLOXACIN 500 MG PO TABS: 500.0000 mg | ORAL_TABLET | Freq: Every day | ORAL | 0 refills | Status: DC

## 2019-10-31 NOTE — Progress Notes (Signed)
Established patient visit   Patient: Tracy Mann   DOB: January 14, 1934   86 y.o. Female  MRN: [id number] Visit Date: 10/31/2019  Today's healthcare provider: Vernie Murders, PA   No chief complaint on file.  Subjective    Dysuria  This is a new problem. The current episode started today. Quality: shar4p. The pain is moderate. There has been no fever. There is a history of pyelonephritis. Associated symptoms include chills, flank pain and urgency. Pertinent negatives include no discharge, frequency, hematuria, hesitancy, nausea, possible pregnancy, sweats or vomiting. She has tried antibiotics for the symptoms. The treatment provided mild relief. Her past medical history is significant for recurrent UTIs.     Past Medical History:  Diagnosis Date  . Acute myocardial infarction of inferior wall ([place])    a. 10/2005 - Presumed to be 2/2 to a coronary embolus in setting of persistent Afib-->Cath 2014 relatively nl cors, EF 45-50% w/ severe distal inferior/apical inferior HK w/ aneursymal appearance.  . Arthritis   . Chronic anticoagulation    a. warfarin.  . Chronic atrial fibrillation (HCC)    a. CHA2DS2VASc = 6--> warfarin.  . Chronic combined systolic and diastolic CHF (congestive heart failure) ([place])    a. 10/2012 Echo: EF 35-40%, diff HK, mild MR, mild biatrial enlargement;  b. 11/2012 EF 45-50% by LV gram; c. echo 07/2014: EF 40-45%, mod conc LVH, mod MR, severe biatrial enlargement, PASP 45 mm Hg c. Echo 01/2018 EF 45-50%, duffuse hypokinesis, mild MR, LA mod dilated, norm RVSF, dilated IVC  . Essential hypertension   . Hyperlipidemia, mixed   . Mixed Ischemic/Nonischemic Cardiomyopathy    a. 10/2012 Echo: EF 35-40%;  b. 11/2012 EF 45-50% by LV gram.  . Nonobstructive coronary artery disease    a. 2007 inferior wall MI thought to be secondary to thrombus from atrial fib b. 2014 cath with R dominant coronary arterial system with mild luminal irregularities c. Myoview 05/2015 old  inferior MI, no new ischemic changes  . Obesity   . Thyroid disease    hypothyroidism  . Type II diabetes mellitus ([place])    a. 07/2018 A1c 8.7 - long term insulin use  . Venous insufficiency   . Vitamin D deficiency    Past Surgical History:  Procedure Laterality Date  . CARDIAC CATHETERIZATION  11/2012   [facility];EF 45-50%  . CHOLECYSTECTOMY  1999  . TUBAL LIGATION  1968  . VESICOVAGINAL FISTULA CLOSURE W/ TAH  1996   Social History   Tobacco Use  . Smoking status: Never Smoker  . Smokeless tobacco: Never Used  Vaping Use  . Vaping Use: Never used  Substance Use Topics  . Alcohol use: No  . Drug use: No   Family Status  Relation Name Status  . Mother  Deceased at age 61       Died of probable heart problems  . Father  Deceased       due to MVA in his 6s  . Sister  Alive  . Brother  Alive  . Daughter  Alive  . Son  Alive  . Brother  Alive  . Brother  Alive  . Son  Alive  . Son  Alive   Allergies  Allergen Reactions  . Other Anaphylaxis and Swelling    'tongue swelling'  . Sulfa Antibiotics Anaphylaxis  . Cephalexin     Blisters  . Clarithromycin Other (See Comments)    Hives, headaches, hard time swelling, felt like throat was  closing up Hives, headaches, hard time swelling, felt like throat was closing up  . Diphenhydramine Other (See Comments)  . Estrogens Conjugated   . Estrogens, Conjugated Other (See Comments)  . Sulfonamide Derivatives Swelling    'tongue swelling'  . Nitrofurantoin Nausea Only       Medications: Outpatient Medications Prior to Visit  Medication Sig  . ACCU-CHEK AVIVA PLUS test strip USE WITH METER TO CHECK GLUCOSE BEFORE MEALS AND AT BEDTIME  . Cholecalciferol 25 MCG (1000 UT) tablet Take 3,000 Units by mouth daily.   . DULoxetine (CYMBALTA) 30 MG capsule Take 1 capsule (30 mg total) by mouth daily.  Marland Kitchen estradiol (ESTRACE) 0.1 MG/GM vaginal cream Place 1 Applicatorful vaginally at bedtime.  Marland Kitchen ezetimibe (ZETIA) 10 MG tablet TAKE 1  TABLET BY MOUTH EVERY DAY  . flavoxATE (URISPAS) 100 MG tablet Take 1 tablet (100 mg total) by mouth 2 (two) times daily as needed for bladder spasms.  . fluticasone (FLONASE) 50 MCG/ACT nasal spray SPRAY TWO SPRAYS IN EACH NOSTRIL ONCE DAILY  . HYDROcodone-acetaminophen (NORCO) 10-325 MG tablet Take 1 tablet by mouth every 6 (six) hours as needed. For after August 25,2021.  . insulin glargine (LANTUS SOLOSTAR) 100 UNIT/ML Solostar Pen Inject 20 units nightly between 8-10 PM. Adjust as directed. Max dose is 50 units daily.  . insulin lispro (HUMALOG KWIKPEN) 100 UNIT/ML KwikPen INJECT 10-20 UNITS THREE TIMES DAILY WITH EACH MEAL.  Marland Kitchen Insulin Pen Needle (RELION PEN NEEDLE 31G/8MM) 31G X 8 MM MISC Use with insulin pen three times daily  . levothyroxine (SYNTHROID) 125 MCG tablet TAKE 1 TABLET (125 MCG TOTAL) BY MOUTH DAILY BEFORE BREAKFAST.  Marland Kitchen lisinopril (ZESTRIL) 2.5 MG tablet TAKE 1 TABLET BY MOUTH EVERYDAY AT BEDTIME  . LORazepam (ATIVAN) 1 MG tablet Take 1 tablet (1 mg total) by mouth 2 (two) times daily as needed.  . Magnesium 500 MG CAPS Take 500 mg by mouth daily.   . metolazone (ZAROXOLYN) 2.5 MG tablet Take 1 tablet (2.5 mg total) by mouth 3 (three) times a week. TAKE 30 minutes before Torsemide  . nitroGLYCERIN (NITROSTAT) 0.4 MG SL tablet Place 1 tablet (0.4 mg total) under the tongue every 5 (five) minutes as needed for chest pain.  . NONFORMULARY OR COMPOUNDED ITEM Diclofenac/Baclofen/Bu/Gabapentin Compounded Cream  . pantoprazole (PROTONIX) 20 MG tablet Take 1 tablet (20 mg total) by mouth daily.  . phenazopyridine (PYRIDIUM) 200 MG tablet Take 1 tablet (200 mg total) by mouth 3 (three) times daily as needed for pain.  . potassium chloride SA (KLOR-CON) 20 MEQ tablet Take 1-2 tablets with metolazone  . torsemide (DEMADEX) 20 MG tablet Take 1 tablet (20 mg total) by mouth daily.  . Vitamin D, Ergocalciferol, (DRISDOL) 50000 units CAPS capsule Take 50,000 Units by mouth. Going to restart    . warfarin (COUMADIN) 3 MG tablet Take 3 mg by mouth every other day.  . warfarin (COUMADIN) 4 MG tablet TAKE 1 TABLET BY MOUTH EVERY DAY   No facility-administered medications prior to visit.    Review of Systems  Constitutional: Positive for chills.  Gastrointestinal: Negative for nausea and vomiting.  Genitourinary: Positive for dysuria, flank pain and urgency. Negative for frequency, hematuria and hesitancy.      Objective    BP (!) 152/63   Pulse 91   Temp 98.8 F (37.1 C) (Oral)   Resp 16   Wt 225 lb 6.4 oz (102.2 kg)   BMI 36.94 kg/m    Physical Exam Constitutional:  General: She is not in acute distress.    Appearance: She is well-developed.  HENT:     Head: Normocephalic and atraumatic.     Right Ear: Hearing normal.     Left Ear: Hearing normal.     Nose: Nose normal.  Eyes:     General: Lids are normal. No scleral icterus.       Right eye: No discharge.        Left eye: No discharge.     Conjunctiva/sclera: Conjunctivae normal.  Cardiovascular:     Rate and Rhythm: Normal rate and regular rhythm.     Heart sounds: Normal heart sounds.  Pulmonary:     Effort: Pulmonary effort is normal. No respiratory distress.     Breath sounds: Normal breath sounds.  Abdominal:     General: Bowel sounds are normal.     Palpations: Abdomen is soft.     Comments: Slight suprapubic tenderness to palpation. No CVA tenderness to percussion.  Musculoskeletal:        General: Normal range of motion.  Skin:    Findings: No lesion or rash.  Neurological:     Mental Status: She is alert and oriented to person, place, and time.  Psychiatric:        Speech: Speech normal.        Behavior: Behavior normal.        Thought Content: Thought content normal.     No results found for any visits on 10/31/19.  Assessment & Plan     1. Dysuria Onset over the past week with bladder pressure discomfort with urgency. History of chronic cystitis that only responds to  "Levaquin". May use AZO-Standard and increase fluid intake. Check urine C&S and given antibiotic. Recheck prn. - POCT urinalysis dipstick - Urine Culture - levofloxacin (LEVAQUIN) 500 MG tablet; Take 1 tablet (500 mg total) by mouth daily.  Dispense: 5 tablet; Refill: 0   No follow-ups on file.      Andres Shad, PA, have reviewed all documentation for this visit. The documentation on 10/31/19 for the exam, diagnosis, procedures, and orders are all accurate and complete.    Vernie Murders, [place] [phone] (phone) [phone] (fax)  [place]

## 2019-11-05 ENCOUNTER — Ambulatory Visit: Payer: Medicare Other | Admitting: Family

## 2019-11-05 LAB — URINE CULTURE

## 2019-11-06 ENCOUNTER — Other Ambulatory Visit: Payer: Self-pay | Admitting: Family Medicine

## 2019-11-06 DIAGNOSIS — F411 Generalized anxiety disorder: Secondary | ICD-10-CM

## 2019-11-06 DIAGNOSIS — F3341 Major depressive disorder, recurrent, in partial remission: Secondary | ICD-10-CM

## 2019-11-06 NOTE — Telephone Encounter (Signed)
1 year supply written at last appointment- ok to change to 90 day supply for convenience.

## 2019-11-12 ENCOUNTER — Ambulatory Visit (INDEPENDENT_AMBULATORY_CARE_PROVIDER_SITE_OTHER): Payer: Medicare Other | Admitting: Family Medicine

## 2019-11-12 ENCOUNTER — Encounter: Payer: Self-pay | Admitting: Family Medicine

## 2019-11-12 ENCOUNTER — Other Ambulatory Visit: Payer: Self-pay

## 2019-11-12 VITALS — BP 135/59 | HR 92 | Temp 98.3°F | Ht 65.5 in | Wt 220.6 lb

## 2019-11-12 DIAGNOSIS — G4700 Insomnia, unspecified: Secondary | ICD-10-CM | POA: Diagnosis not present

## 2019-11-12 DIAGNOSIS — M150 Primary generalized (osteo)arthritis: Secondary | ICD-10-CM

## 2019-11-12 DIAGNOSIS — I25118 Atherosclerotic heart disease of native coronary artery with other forms of angina pectoris: Secondary | ICD-10-CM

## 2019-11-12 DIAGNOSIS — F411 Generalized anxiety disorder: Secondary | ICD-10-CM | POA: Diagnosis not present

## 2019-11-12 DIAGNOSIS — Z0184 Encounter for antibody response examination: Secondary | ICD-10-CM

## 2019-11-12 DIAGNOSIS — M51379 Other intervertebral disc degeneration, lumbosacral region without mention of lumbar back pain or lower extremity pain: Secondary | ICD-10-CM

## 2019-11-12 DIAGNOSIS — M8949 Other hypertrophic osteoarthropathy, multiple sites: Secondary | ICD-10-CM

## 2019-11-12 DIAGNOSIS — Z789 Other specified health status: Secondary | ICD-10-CM

## 2019-11-12 DIAGNOSIS — F3341 Major depressive disorder, recurrent, in partial remission: Secondary | ICD-10-CM

## 2019-11-12 DIAGNOSIS — M159 Polyosteoarthritis, unspecified: Secondary | ICD-10-CM

## 2019-11-12 DIAGNOSIS — M5137 Other intervertebral disc degeneration, lumbosacral region: Secondary | ICD-10-CM

## 2019-11-12 DIAGNOSIS — M170 Bilateral primary osteoarthritis of knee: Secondary | ICD-10-CM

## 2019-11-12 DIAGNOSIS — E1140 Type 2 diabetes mellitus with diabetic neuropathy, unspecified: Secondary | ICD-10-CM

## 2019-11-12 DIAGNOSIS — I482 Chronic atrial fibrillation, unspecified: Secondary | ICD-10-CM

## 2019-11-12 MED ORDER — PAROXETINE HCL 10 MG PO TABS: 10.0000 mg | ORAL_TABLET | Freq: Every day | ORAL | 5 refills | Status: DC

## 2019-11-12 MED ORDER — HYDROCODONE-ACETAMINOPHEN 10-325 MG PO TABS
1.0000 | ORAL_TABLET | Freq: Four times a day (QID) | ORAL | 0 refills | Status: DC | PRN
Start: 2019-11-12 — End: 2019-12-08

## 2019-11-12 NOTE — Progress Notes (Signed)
Established patient visit   Patient: Tracy Mann   DOB: 09-11-1933   86 y.o. Female  MRN: [id number] Visit Date: 11/12/2019  Today's healthcare provider: Wilhemena Durie, MD   Chief Complaint  Patient presents with  . Depression  . Follow-up   Subjective    HPI  Patient claims to get not  tolerate Cymbalta due to side effects.  She states her depression is a little bit better as she is more engaged.  She has not had her Covid vaccine request Covid antibodies. She complains bitterly of insomnia.  PHQ-9 is 12. Per her complaints today she wants to go back on benzodiazepines.  He states the lorazepam really helped her a lot in the past.  She still sees chronic pain later this month but they will not refill her narcotics on the first visit.  She has for this refill. PHQ-9 today is 12. Depression, Follow-up  She  was last seen for this 1 months ago. Changes made at last visit include; Started Cymbalta 30 mg daily which should help depression anxiety and neuropathic pain.   She reports poor compliance with treatment. She is having side effects. Patient reports symptoms worsen when taking medication.  She reports poor tolerance of treatment. Current symptoms include: insomnia She feels she is Unchanged since last visit.  Depression screen [facility] 2/9 10/15/2019 04/30/2018 03/19/2018  Decreased Interest 2 0 0  Down, Depressed, Hopeless 1 0 0  PHQ - 2 Score 3 0 0  Altered sleeping 3 - -  Tired, decreased energy 3 - -  Change in appetite 3 - -  Feeling bad or failure about yourself  0 - -  Trouble concentrating 1 - -  Moving slowly or fidgety/restless 1 - -  Suicidal thoughts 0 - -  PHQ-9 Score 14 - -  Difficult doing work/chores Somewhat difficult - -  Some recent data might be hidden    --------------------------------------------------------------------      Medications: Outpatient Medications Prior to Visit  Medication Sig  . ACCU-CHEK AVIVA PLUS test strip  USE WITH METER TO CHECK GLUCOSE BEFORE MEALS AND AT BEDTIME  . Cholecalciferol 25 MCG (1000 UT) tablet Take 3,000 Units by mouth daily.   . DULoxetine (CYMBALTA) 30 MG capsule TAKE 1 CAPSULE BY MOUTH EVERY DAY  . estradiol (ESTRACE) 0.1 MG/GM vaginal cream Place 1 Applicatorful vaginally at bedtime.  Marland Kitchen ezetimibe (ZETIA) 10 MG tablet TAKE 1 TABLET BY MOUTH EVERY DAY  . flavoxATE (URISPAS) 100 MG tablet Take 1 tablet (100 mg total) by mouth 2 (two) times daily as needed for bladder spasms.  . fluticasone (FLONASE) 50 MCG/ACT nasal spray SPRAY TWO SPRAYS IN EACH NOSTRIL ONCE DAILY  . HYDROcodone-acetaminophen (NORCO) 10-325 MG tablet Take 1 tablet by mouth every 6 (six) hours as needed. For after August 25,2021.  . insulin glargine (LANTUS SOLOSTAR) 100 UNIT/ML Solostar Pen Inject 20 units nightly between 8-10 PM. Adjust as directed. Max dose is 50 units daily.  . insulin lispro (HUMALOG KWIKPEN) 100 UNIT/ML KwikPen INJECT 10-20 UNITS THREE TIMES DAILY WITH EACH MEAL.  Marland Kitchen Insulin Pen Needle (RELION PEN NEEDLE 31G/8MM) 31G X 8 MM MISC Use with insulin pen three times daily  . levofloxacin (LEVAQUIN) 500 MG tablet Take 1 tablet (500 mg total) by mouth daily.  Marland Kitchen levothyroxine (SYNTHROID) 125 MCG tablet TAKE 1 TABLET (125 MCG TOTAL) BY MOUTH DAILY BEFORE BREAKFAST.  Marland Kitchen lisinopril (ZESTRIL) 2.5 MG tablet TAKE 1 TABLET BY MOUTH EVERYDAY  AT BEDTIME  . LORazepam (ATIVAN) 1 MG tablet Take 1 tablet (1 mg total) by mouth 2 (two) times daily as needed.  . Magnesium 500 MG CAPS Take 500 mg by mouth daily.   . metolazone (ZAROXOLYN) 2.5 MG tablet Take 1 tablet (2.5 mg total) by mouth 3 (three) times a week. TAKE 30 minutes before Torsemide  . nitroGLYCERIN (NITROSTAT) 0.4 MG SL tablet Place 1 tablet (0.4 mg total) under the tongue every 5 (five) minutes as needed for chest pain.  . NONFORMULARY OR COMPOUNDED ITEM Diclofenac/Baclofen/Bu/Gabapentin Compounded Cream  . pantoprazole (PROTONIX) 20 MG tablet Take 1 tablet  (20 mg total) by mouth daily.  . phenazopyridine (PYRIDIUM) 200 MG tablet Take 1 tablet (200 mg total) by mouth 3 (three) times daily as needed for pain.  . potassium chloride SA (KLOR-CON) 20 MEQ tablet Take 1-2 tablets with metolazone  . torsemide (DEMADEX) 20 MG tablet Take 1 tablet (20 mg total) by mouth daily.  . Vitamin D, Ergocalciferol, (DRISDOL) 50000 units CAPS capsule Take 50,000 Units by mouth. Going to restart  . warfarin (COUMADIN) 3 MG tablet Take 3 mg by mouth every other day.  . warfarin (COUMADIN) 4 MG tablet TAKE 1 TABLET BY MOUTH EVERY DAY   No facility-administered medications prior to visit.    Review of Systems  Constitutional: Negative for appetite change, chills, fatigue and fever.  Respiratory: Negative for chest tightness and shortness of breath.   Cardiovascular: Negative for chest pain and palpitations.  Gastrointestinal: Negative for abdominal pain, nausea and vomiting.  Neurological: Negative for dizziness and weakness.  Psychiatric/Behavioral: Positive for dysphoric mood and sleep disturbance.       Objective    BP (!) 135/59 (BP Location: Left Arm, Patient Position: Sitting, Cuff Size: Normal)   Pulse 92   Temp 98.3 F (36.8 C) (Oral)   Ht 5' 5.5" (1.664 m)   Wt 220 lb 9.6 oz (100.1 kg)   SpO2 91%   BMI 36.15 kg/m  BP Readings from Last 3 Encounters:  11/13/19 110/80  11/12/19 (!) 135/59  10/31/19 (!) 152/63   Wt Readings from Last 3 Encounters:  11/13/19 219 lb (99.3 kg)  11/12/19 220 lb 9.6 oz (100.1 kg)  10/31/19 225 lb 6.4 oz (102.2 kg)      Physical Exam Vitals reviewed.  Constitutional:      General: She is not in acute distress.    Appearance: She is well-developed. She is obese.  HENT:     Head: Normocephalic and atraumatic.     Right Ear: Hearing and external ear normal.     Left Ear: Hearing and external ear normal.     Nose: Nose normal.  Eyes:     General: Lids are normal. No scleral icterus.       Right eye: No  discharge.        Left eye: No discharge.     Conjunctiva/sclera: Conjunctivae normal.  Neck:     Thyroid: No thyromegaly.  Cardiovascular:     Rate and Rhythm: Normal rate and regular rhythm.     Heart sounds: Normal heart sounds.  Pulmonary:     Effort: Pulmonary effort is normal. No respiratory distress.  Abdominal:     Palpations: Abdomen is soft.     Tenderness: There is no right CVA tenderness or left CVA tenderness.  Musculoskeletal:        General: Normal range of motion.     Comments: 1+ LE  edema.  Skin:  General: Skin is warm and dry.     Findings: No lesion.  Neurological:     General: No focal deficit present.     Mental Status: She is alert and oriented to person, place, and time. Mental status is at baseline.  Psychiatric:        Mood and Affect: Mood normal.        Speech: Speech normal.        Behavior: Behavior normal.        Thought Content: Thought content normal.        Judgment: Judgment normal.       No results found for any visits on 11/12/19.  Assessment & Plan     1. Recurrent major depressive disorder, in partial remission ([place]) We will carefully try Paxil at 10 mg nightly to help sleep depression and anxiety.  2. Insomnia, unspecified type Consider Belsomra  3. GAD (generalized anxiety disorder) I am having difficulty want to continue lorazepam in this patient who is also on Norco.  4. Chronic atrial fibrillation ([place]) On Coumadin  5. Atherosclerosis of native coronary artery of native heart with stable angina pectoris ([place]) Risk factors treated  6. Primary osteoarthritis of both knees   7. Degeneration of lumbar or lumbosacral intervertebral disc   8. Statin intolerance Per patient  9. Type 2 diabetes mellitus with diabetic neuropathy, without long-term current use of insulin ([place])   10. Primary osteoarthritis involving multiple joints  - HYDROcodone-acetaminophen (NORCO) 10-325 MG tablet; Take 1 tablet by mouth every 6  (six) hours as needed. For after August 25,2021.  Dispense: 110 tablet; Refill: 0  11. COVID-19 virus IgG antibody test result unknown Advise patient if she has no antibodies or recommend the vaccine. - SAR CoV2 Serology (COVID 19)AB(IGG)IA   No follow-ups on file.         Una Yeomans Cranford Mon, MD  [facility] [phone] (phone) [phone] (fax)  [place]

## 2019-11-13 ENCOUNTER — Ambulatory Visit: Payer: Medicare Other | Admitting: Family

## 2019-11-13 ENCOUNTER — Telehealth: Payer: Self-pay | Admitting: Family Medicine

## 2019-11-13 ENCOUNTER — Other Ambulatory Visit: Payer: Self-pay

## 2019-11-13 ENCOUNTER — Ambulatory Visit: Payer: Self-pay | Admitting: *Deleted

## 2019-11-13 ENCOUNTER — Encounter: Payer: Self-pay | Admitting: Family

## 2019-11-13 ENCOUNTER — Telehealth: Payer: Self-pay | Admitting: Cardiovascular Disease

## 2019-11-13 VITALS — BP 110/80 | HR 68 | Ht 65.5 in | Wt 219.0 lb

## 2019-11-13 DIAGNOSIS — R002 Palpitations: Secondary | ICD-10-CM

## 2019-11-13 DIAGNOSIS — I5042 Chronic combined systolic (congestive) and diastolic (congestive) heart failure: Secondary | ICD-10-CM

## 2019-11-13 DIAGNOSIS — Z7901 Long term (current) use of anticoagulants: Secondary | ICD-10-CM | POA: Diagnosis not present

## 2019-11-13 DIAGNOSIS — I482 Chronic atrial fibrillation, unspecified: Secondary | ICD-10-CM

## 2019-11-13 LAB — SAR COV2 SEROLOGY (COVID19)AB(IGG),IA: DiaSorin SARS-CoV-2 Ab, IgG: NEGATIVE

## 2019-11-13 MED ORDER — METOPROLOL TARTRATE 25 MG PO TABS: 25.0000 mg | ORAL_TABLET | ORAL | 1 refills | Status: DC | PRN

## 2019-11-13 MED ORDER — METOLAZONE 2.5 MG PO TABS: 2.5000 mg | ORAL_TABLET | ORAL | 1 refills | Status: DC

## 2019-11-13 NOTE — Telephone Encounter (Signed)
ok 

## 2019-11-13 NOTE — Telephone Encounter (Signed)
*  STAT* If patient is at the pharmacy, call can be transferred to refill team.   1. Which medications need to be refilled? (please list name of each medication and dose if known) metoprolol tartrate 25 mg lopressor  2. Which pharmacy/location (including street and city if local pharmacy) is medication to be sent to? [facility] [place]  3. Do they need a 30 day or 90 day supply? 30  Pharmacy stated they did not have this when she went this afternoon

## 2019-11-13 NOTE — Telephone Encounter (Signed)
The pt called stating that she would like to have a note placed on her chart that SSRIs never be prescribed; the pt says she is having headache, stomach cramps; these symptoms started at 2230 11/12/19; her 1st dose of med was at 2200; initially her headache was 7.5-8 out of 10; she took hydrocodone for her headache and it is now rated 5 out of 10; the pt says her HR was at 116 at 0800 on 11/13/19; the pt says she would like to starting using her lorazepam again because it does not cause her to have symptoms; recommendations made per nurse triage protocol; she verbalized understanding; the pt can be contacted at [phone]; a message can be left on the voicemail; the pt sees Dr Miguel Aschoff, [facility]; will route to office for final disposition  Reason for Disposition  [1] Caller has NON-URGENT medicine question about med that PCP prescribed AND [2] triager unable to answer question  Answer Assessment - Initial Assessment Questions 1. NAME of MEDICATION: "What medicine are you calling about?"     paroxetine 2. QUESTION: "What is your question?" (e.g., medication refill, side effect)   The pt does not want to be given this medication 3. PRESCRIBING HCP: "Who prescribed it?" Reason: if prescribed by specialist, call should be referred to that group.     Dr Gwynneth Albright  4. SYMPTOMS: "Do you have any symptoms?"     *No Answer* 5. SEVERITY: If symptoms are present, ask "Are they mild, moderate or severe?"     *No Answer* 6. PREGNANCY:  "Is there any chance that you are pregnant?" "When was your last menstrual period?"     *No Answer*  Protocols used: MEDICATION QUESTION CALL-A-AH

## 2019-11-13 NOTE — Telephone Encounter (Signed)
Pt was prescribed PARoxetine (PAXIL) 10 MG tablet  Pt stated that this medication gave her a terrible headache, stomach cramps and high pulse/ Pt asked to have a record to not give her this medication again as well as Duloxetine 30mg  because it gives her the same reaction / please advise

## 2019-11-13 NOTE — Telephone Encounter (Signed)
metoprolol tartrate (LOPRESSOR) 25 MG tablet 30 tablet 1 11/13/2019    Sig - Route: Take 1 tablet (25 mg total) by mouth as needed (As needed once per day for heart rate persistently >100bpm). - Oral   Sent to pharmacy as: metoprolol tartrate (LOPRESSOR) 25 MG tablet   E-Prescribing Status: Receipt confirmed by pharmacy (11/13/2019  2:00 PM EDT)

## 2019-11-13 NOTE — Patient Instructions (Addendum)
Medication Instructions:  Your physician has recommended you make the following change in your medication:   CONTINUE Metolazone 2.5mg  twice per week and Torsemide 20mg  daily  START Metoprolol Tartrate 25mg  daily as needed for palpitations  *If you need a refill on your cardiac medications before your next appointment, please call your pharmacy*  Lab Work: No lab work today  Testing/Procedures: Your EKG today shows rate controlled atrial fibrillation.  Follow-Up: At [facility], you and your health needs are our priority.  As part of our continuing mission to provide you with exceptional heart care, we have created designated Provider Care Teams.  These Care Teams include your primary Cardiologist (physician) and Advanced Practice Providers (APPs -  Physician Assistants and Nurse Practitioners) who all work together to provide you with the care you need, when you need it.  We recommend signing up for the patient portal called "MyChart".  Sign up information is provided on this After Visit Summary.  MyChart is used to connect with patients for Virtual Visits (Telemedicine).  Patients are able to view lab/test results, encounter notes, upcoming appointments, etc.  Non-urgent messages can be sent to your provider as well.   To learn more about what you can do with MyChart, go to [email or web address].    Your next appointment:   4 month(s)  The format for your next appointment:   In Person  Provider:   You may see Ida Rogue, MD or one of the following Advanced Practice Providers on your designated Care Team:    Murray Hodgkins, NP  Christell Faith, PA-C  Marrianne Mood, PA-C  Cadence Vermillion, [place]  Laurann Montana, NP   Other Instructions  Pursed Lip Breathing Pursed lip breathing is a technique to relieve the feeling of being short of breath. Pursed lip breathing keeps your airways open longer when you exhale and empties more air from your lungs. This makes more space  for fresh air when you inhale. Pursed lip breathing can also slow down your breathing and help your body not have to work so hard to breathe. Over time, pursed lip breathing may help you be able to be more physically active and do more activities. How to perform pursed lip breathing Being short of breath can make you tense and anxious. Before you start this breathing exercise, take a minute to relax your shoulders and close your eyes. Then: 1. Start the exercise by closing your mouth. 2. Breathe in through your nose, taking a normal breath. You can do this at your normal rate of breathing. If you feel you are not getting enough air, breathe in while slowly counting to 2 or 3. 3. Pucker (purse) your lips as if you were going to whistle. 4. Gently tighten your abdomen muscles or press on your belly to help push the air out. 5. Breathe out slowly through your pursed lips. Take at least twice as long to breathe out as it takes you to breathe in. 6. Make sure that you breathe out all of the air, but do not force air out. 7. Repeat the exercise until your breathing improves. Ask your health care provider how often and how long to do this exercise.

## 2019-11-13 NOTE — Telephone Encounter (Signed)
Medication resent.  Requested Prescriptions   Signed Prescriptions Disp Refills  . metoprolol tartrate (LOPRESSOR) 25 MG tablet 30 tablet 1    Sig: Take 1 tablet (25 mg total) by mouth as needed (As needed once per day for heart rate persistently >100bpm).    Authorizing Provider: Loel Dubonnet    Ordering User: Britt Bottom

## 2019-11-13 NOTE — Progress Notes (Signed)
Office Visit    Patient Name: Tracy Mann Date of Encounter: 11/14/2019  Primary Care Provider:  Jerrol Banana., MD Primary Cardiologist:  Ida Rogue, MD Electrophysiologist:  None   Chief Complaint    Tracy Mann is a 86 y.o. female with a hx of chronic atrial fibrillation on Coumadin, chronic combined systolic and diastolic heart failure, HTN, HLD, morbid obesity, moderate pulmonary hypertension, CAD s/p inferior wall MI 2007 felt to be secondary to thrombus from atrial fibrillation, lower extremity edema  presents today for follow up of diastolic heart failure.   Past Medical History    Past Medical History:  Diagnosis Date  . Acute myocardial infarction of inferior wall ([place])    a. 10/2005 - Presumed to be 2/2 to a coronary embolus in setting of persistent Afib-->Cath 2014 relatively nl cors, EF 45-50% w/ severe distal inferior/apical inferior HK w/ aneursymal appearance.  . Arthritis   . Chronic anticoagulation    a. warfarin.  . Chronic atrial fibrillation (HCC)    a. CHA2DS2VASc = 6--> warfarin.  . Chronic combined systolic and diastolic CHF (congestive heart failure) ([place])    a. 10/2012 Echo: EF 35-40%, diff HK, mild MR, mild biatrial enlargement;  b. 11/2012 EF 45-50% by LV gram; c. echo 07/2014: EF 40-45%, mod conc LVH, mod MR, severe biatrial enlargement, PASP 45 mm Hg c. Echo 01/2018 EF 45-50%, duffuse hypokinesis, mild MR, LA mod dilated, norm RVSF, dilated IVC  . Essential hypertension   . Hyperlipidemia, mixed   . Mixed Ischemic/Nonischemic Cardiomyopathy    a. 10/2012 Echo: EF 35-40%;  b. 11/2012 EF 45-50% by LV gram.  . Nonobstructive coronary artery disease    a. 2007 inferior wall MI thought to be secondary to thrombus from atrial fib b. 2014 cath with R dominant coronary arterial system with mild luminal irregularities c. Myoview 05/2015 old inferior MI, no new ischemic changes  . Obesity   . Thyroid disease    hypothyroidism  . Type II  diabetes mellitus ([place])    a. 07/2018 A1c 8.7 - long term insulin use  . Venous insufficiency   . Vitamin D deficiency    Past Surgical History:  Procedure Laterality Date  . CARDIAC CATHETERIZATION  11/2012   [facility];EF 45-50%  . CHOLECYSTECTOMY  1999  . TUBAL LIGATION  1968  . VESICOVAGINAL FISTULA CLOSURE W/ TAH  1996    Allergies  Allergies  Allergen Reactions  . Duloxetine Other (See Comments)    Same as Paroxetine  . Other Anaphylaxis and Swelling    'tongue swelling'  . Paroxetine Other (See Comments)    "Horrible headache," Stomach pain, Diarrhea  . Sulfa Antibiotics Anaphylaxis  . Cephalexin     Blisters  . Clarithromycin Other (See Comments)    Hives, headaches, hard time swelling, felt like throat was closing up Hives, headaches, hard time swelling, felt like throat was closing up  . Diphenhydramine Other (See Comments)  . Estrogens Conjugated   . Estrogens, Conjugated Other (See Comments)  . Sulfonamide Derivatives Swelling    'tongue swelling'  . Nitrofurantoin Nausea Only    History of Present Illness    Tracy Mann is a 86 y.o. female with a hx of chronic atrial fibrillation on Coumadin, chronic combined systolic and diastolic heart failure, HTN, HLD, morbid obesity, moderate pulmonary hypertension, CAD s/p inferior wall MI 2007 felt to be secondary to thrombus from atrial fibrillation, lower extremity edema, DM2, arthralgias.   Echo 01/2018 with  LVEF 40-45%, diffuse ypokinesis, mild MR, moderately dilated LA, RV normal systolic pressure. She wore a ZIO monitor 07/2018 showing persistent atrial fib with variable rates. Noted pauses up to 4.2 seconds.    Telephone encounter 05/21/19 noting shortness of breath.Checked at home with O2 85% while ambulating and low 90's while at rest. Reported "legs heavy and hard as a rock". Reported she was never given CPAP despite hx of OSA.   At visit 04/28/19 noted to be up to 235lbs compared to previous visit a few months  prior with weight 227lb. She is unable to wear compression hose. Has had reccurent difficulties with UTI and urethral irritation which affect her willingness to take Lasix.   Seen 05/22/19 with dyspnea and fluid retention due to being unable to take Lasix as it irritated her interstitial cystitis. Her Lasix was increased to 40mg  daily. At clinic 05/26/19 Metolazone 2.5 mg every other day was added and she was switched to Torsemide. Subsequent follow up visits she was noted to take her diuretic inconsistently leading to periodic weight gain. Regular adherence to diuretic regimen was strongly encouraged.  Presents today for follow up. Reports no shortness of breath at rest.  Her dyspnea on exertion is stable at baseline.. Reports no chest pain, pressure, or tightness. No orthopnea, PND.  Reports intermittent lower extremity edema if she misses dose of diuretic Reports no palpitations. She had palpitations last night and tachycardia with self reported heart rate  Up to 120 bpm - she took an old prescription of Toprol from 2019 with relief. No recurrent palpitations.  She has upcoming new patient appointment with pain management clinic and is hopeful to get some relief for the pain in her legs. She is excited for an upcoming trip this weekend.   EKGs/Labs/Other Studies Reviewed:   The following studies were reviewed today:  Echo 02/15/18 Left ventricle: The cavity size was normal. Systolic function was    mildly to moderately reduced. The estimated ejection fraction was    in the range of 40% to 45%. Diffuse hypokinesis. Regional wall    motion abnormalities cannot be excluded. The study is not    technically sufficient to allow evaluation of LV diastolic    function.  - Mitral valve: There was mild regurgitation.  - Left atrium: The atrium was moderately dilated.  - Right ventricle: Systolic function was normal.  - Inferior vena cava: The vessel was dilated. The respirophasic    diameter changes were  blunted (< 50%), consistent with elevated    central venous pressure.   ZIO 08/30/18 Atrial fibrillation, 100% burden Rate ranging from 30-188 bpm (avg of 83 bpm).   12 Ventricular Tachycardia runs occurred, the run with the fastest interval lasting 12 beats with a max rate of 203 bpm, the longest lasting 8 beats with an avg rate of 133 bpm.    3 Pauses occurred, the longest lasting 4.2 secs (14 bpm). On 09/01/18: at 8:06 Am, 4.2 sec On 09/02/18 at 9:15 AM, 3.2 sec On 09/08/18 at 10:21 AM, 3.1 sec.    Isolated VEs were occasional (1.7%, [id number]), VE Couplets were rare (<1.0%, 2096), and VE Triplets were rare (<1.0%, 38). Ventricular Bigeminy and Trigeminy were present.  EKG:  EKG is ordered today.  The ekg ordered today demonstrates rate controlled atrial fibrillation 68 pm with nonspecific intraventricular block and poor R wave progression.   Recent Labs: 07/29/2019: BUN 26; Creatinine, Ser 0.79; Potassium 4.9; Sodium 142  Recent Lipid Panel    Component  Value Date/Time   CHOL 172 01/17/2019 1127   TRIG 119 01/17/2019 1127   HDL 57 01/17/2019 1127   CHOLHDL 3.0 01/17/2019 1127   CHOLHDL 6.0 05/21/2015 0142   VLDL 45 (H) 05/21/2015 0142   LDLCALC 94 01/17/2019 1127    Home Medications   Current Meds  Medication Sig  . ACCU-CHEK AVIVA PLUS test strip USE WITH METER TO CHECK GLUCOSE BEFORE MEALS AND AT BEDTIME  . Cholecalciferol 25 MCG (1000 UT) tablet Take 3,000 Units by mouth daily.   Marland Kitchen estradiol (ESTRACE) 0.1 MG/GM vaginal cream Place 1 Applicatorful vaginally at bedtime.  Marland Kitchen ezetimibe (ZETIA) 10 MG tablet TAKE 1 TABLET BY MOUTH EVERY DAY  . flavoxATE (URISPAS) 100 MG tablet Take 1 tablet (100 mg total) by mouth 2 (two) times daily as needed for bladder spasms.  . fluticasone (FLONASE) 50 MCG/ACT nasal spray SPRAY TWO SPRAYS IN EACH NOSTRIL ONCE DAILY  . HYDROcodone-acetaminophen (NORCO) 10-325 MG tablet Take 1 tablet by mouth every 6 (six) hours as needed. For after August 25,2021.    . insulin glargine (LANTUS SOLOSTAR) 100 UNIT/ML Solostar Pen Inject 20 units nightly between 8-10 PM. Adjust as directed. Max dose is 50 units daily.  . insulin lispro (HUMALOG KWIKPEN) 100 UNIT/ML KwikPen INJECT 10-20 UNITS THREE TIMES DAILY WITH EACH MEAL.  Marland Kitchen Insulin Pen Needle (RELION PEN NEEDLE 31G/8MM) 31G X 8 MM MISC Use with insulin pen three times daily  . levofloxacin (LEVAQUIN) 500 MG tablet Take 1 tablet (500 mg total) by mouth daily.  Marland Kitchen levothyroxine (SYNTHROID) 125 MCG tablet TAKE 1 TABLET (125 MCG TOTAL) BY MOUTH DAILY BEFORE BREAKFAST.  Marland Kitchen lisinopril (ZESTRIL) 2.5 MG tablet TAKE 1 TABLET BY MOUTH EVERYDAY AT BEDTIME  . LORazepam (ATIVAN) 1 MG tablet Take 1 tablet (1 mg total) by mouth 2 (two) times daily as needed.  . Magnesium 500 MG CAPS Take 500 mg by mouth daily.   . metolazone (ZAROXOLYN) 2.5 MG tablet Take 1 tablet (2.5 mg total) by mouth 2 (two) times a week. TAKE 30 minutes before Torsemide  . metoprolol tartrate (LOPRESSOR) 25 MG tablet Take 1 tablet (25 mg total) by mouth as needed (As needed once per day for heart rate persistently >100bpm).  . nitroGLYCERIN (NITROSTAT) 0.4 MG SL tablet Place 1 tablet (0.4 mg total) under the tongue every 5 (five) minutes as needed for chest pain.  . NONFORMULARY OR COMPOUNDED ITEM Diclofenac/Baclofen/Bu/Gabapentin Compounded Cream  . pantoprazole (PROTONIX) 20 MG tablet Take 1 tablet (20 mg total) by mouth daily.  . phenazopyridine (PYRIDIUM) 200 MG tablet Take 1 tablet (200 mg total) by mouth 3 (three) times daily as needed for pain.  . potassium chloride SA (KLOR-CON) 20 MEQ tablet Take 1-2 tablets with metolazone  . torsemide (DEMADEX) 20 MG tablet Take 1 tablet (20 mg total) by mouth daily.  . Vitamin D, Ergocalciferol, (DRISDOL) 50000 units CAPS capsule Take 50,000 Units by mouth. Going to restart  . warfarin (COUMADIN) 4 MG tablet TAKE 1 TABLET BY MOUTH EVERY DAY    Review of Systems   All other systems reviewed and are  otherwise negative except as noted above.  Physical Exam    VS:  BP 110/80   Pulse 68   Ht 5' 5.5" (1.664 m)   Wt 219 lb (99.3 kg)   BMI 35.89 kg/m  , BMI Body mass index is 35.89 kg/m. GEN: Well nourished, overweight, well developed, in no acute distress. HEENT: normal. Neck: Supple, no JVD, carotid bruits,  or masses. Cardiac: RRR, no murmurs, rubs, or gallops. No clubbing, cyanosis.  Radials/DP/PT 2+ and equal bilaterally.  Bilateral lower extremity with non pitting edema. Respiratory:  Respirations regular and unlabored.  Bilateral lower lobes with crackles.  Bilateral upper lobes with expiratory wheeze. GI: Soft, nontender, nondistended, BS + x 4. MS: No deformity or atrophy. Skin: Warm and dry, no rash.  Skin on bilateral lower extremities is mildly reddened and notably firm to touch. Neuro:  Strength and sensation are intact. Psych: Normal affect.  Assessment & Plan    1. HFrEF/DOE/LE edema -Echo 02/15/2018 with LVEF 40-45%, indeterminate LV diastolic parameters, mild MR, LA moderately dilated.  Weight down 6 pounds since last clinic visit. She continues to intermittently miss her diuretic - regular compliance strongly encouraged. Continue Torsemide 20mg  daily and metolazone 2.5 mg twice per week.  Record daily weights.  Report weight gain of 3 pounds overnight or 5 pounds in 1 week.  Low-sodium, healthy diet encouraged.  Regular cardiovascular exercise encouraged.  2. HLD, LDL goal <70 -Lipid panel 01/17/2019 total cholesterol 172, HDL 57, triglycerides 119, LDL 94.  Continue Zetia 10 mg daily. Reported statin intolerance. She politely declines tranistion to Nexlizet or PCSK9i.  3. HTN -BP well controlled.  Continue present antihypertensive regimen including lisinopril 2.5 mg daily.  4. CAD -s/p inferior wall MI in 2007.  Lexiscan Myoview 2017 low risk study.  Reports no anginal symptoms. EKG today without acute ST/T wave changes.  GDMT includes Zetia.  No aspirin secondary to  chronic anticoagulation.    5. Chronic atrial fibrillation/Chronic anticoagulation - Rate controlled on EKG today. Chronic anticoagulation for CHADS2VASc of at least 7 (agex2, gender, HTN, HF, DM2, CAD). Denies bleeding complications. Follows with coumadin clinic at her PCP.  6. DM2 - Established with Dr. Ancil Boozer of endocrinology.   7. Palpitations - Occurring intermittently. Start Metoprolol tartrate 25mg  once daily as needed for palpitations. Rx provided.   Disposition: Follow up in 4 month(s) with Dr. Wallie Renshaw, NP 11/14/2019, 7:53 PM

## 2019-11-14 ENCOUNTER — Encounter: Payer: Self-pay | Admitting: Family

## 2019-11-19 ENCOUNTER — Ambulatory Visit (INDEPENDENT_AMBULATORY_CARE_PROVIDER_SITE_OTHER): Payer: Medicare Other

## 2019-11-19 ENCOUNTER — Other Ambulatory Visit: Payer: Self-pay

## 2019-11-19 DIAGNOSIS — I482 Chronic atrial fibrillation, unspecified: Secondary | ICD-10-CM | POA: Diagnosis not present

## 2019-11-19 LAB — POCT INR
INR: 4.3 — AB (ref 2.0–3.0)
PT: 51.3

## 2019-11-19 NOTE — Patient Instructions (Signed)
Description   Continue hold for 2 days then alternate 3 mg and 4 mg daily return in 2 weeks

## 2019-11-20 ENCOUNTER — Ambulatory Visit (INDEPENDENT_AMBULATORY_CARE_PROVIDER_SITE_OTHER): Payer: Medicare Other

## 2019-11-20 ENCOUNTER — Other Ambulatory Visit: Payer: Self-pay

## 2019-11-20 DIAGNOSIS — Z23 Encounter for immunization: Secondary | ICD-10-CM | POA: Diagnosis not present

## 2019-11-24 ENCOUNTER — Ambulatory Visit: Payer: Self-pay | Admitting: Family Medicine

## 2019-11-25 ENCOUNTER — Encounter: Payer: Self-pay | Admitting: Family Medicine

## 2019-11-25 ENCOUNTER — Other Ambulatory Visit: Payer: Self-pay | Admitting: Family Medicine

## 2019-11-25 DIAGNOSIS — M4316 Spondylolisthesis, lumbar region: Secondary | ICD-10-CM | POA: Insufficient documentation

## 2019-11-25 DIAGNOSIS — M47816 Spondylosis without myelopathy or radiculopathy, lumbar region: Secondary | ICD-10-CM | POA: Insufficient documentation

## 2019-11-25 DIAGNOSIS — R937 Abnormal findings on diagnostic imaging of other parts of musculoskeletal system: Secondary | ICD-10-CM | POA: Insufficient documentation

## 2019-11-25 DIAGNOSIS — Z87898 Personal history of other specified conditions: Secondary | ICD-10-CM | POA: Insufficient documentation

## 2019-11-25 DIAGNOSIS — G894 Chronic pain syndrome: Secondary | ICD-10-CM | POA: Insufficient documentation

## 2019-11-25 DIAGNOSIS — M899 Disorder of bone, unspecified: Secondary | ICD-10-CM | POA: Insufficient documentation

## 2019-11-25 DIAGNOSIS — Z9189 Other specified personal risk factors, not elsewhere classified: Secondary | ICD-10-CM | POA: Insufficient documentation

## 2019-11-25 DIAGNOSIS — M48061 Spinal stenosis, lumbar region without neurogenic claudication: Secondary | ICD-10-CM | POA: Insufficient documentation

## 2019-11-25 DIAGNOSIS — Z789 Other specified health status: Secondary | ICD-10-CM | POA: Insufficient documentation

## 2019-11-25 DIAGNOSIS — M48062 Spinal stenosis, lumbar region with neurogenic claudication: Secondary | ICD-10-CM | POA: Insufficient documentation

## 2019-11-25 NOTE — Telephone Encounter (Signed)
Requested medication (s) are due for refill today: yes  Requested medication (s) are on the active medication list: yes  Last refill:  09/01/19  Future visit scheduled: yes  Notes to clinic:  med not delegated to NT to RF   Requested Prescriptions  Pending Prescriptions Disp Refills   LORazepam (ATIVAN) 1 MG tablet [Pharmacy Med Name: LORAZEPAM 1 MG TABLET] 60 tablet 1    Sig: Take 1 tablet (1 mg total) by mouth 2 (two) times daily as needed.      Not Delegated - Psychiatry:  Anxiolytics/Hypnotics Failed - 11/25/2019  2:10 PM      Failed - This refill cannot be delegated      Failed - Urine Drug Screen completed in last 360 days.      Passed - Valid encounter within last 6 months    Recent Outpatient Visits           1 week ago Recurrent major depressive disorder, in partial remission [facility])   [facility] Jerrol Banana., MD   3 weeks ago [place], [place]   1 month ago Chronic pain syndrome   [facility] Jerrol Banana., MD   2 months ago Type 2 diabetes mellitus without complication, without long-term current use of insulin [facility])   [facility] Jerrol Banana., MD   3 months ago Chronic pain syndrome   [facility] Jerrol Banana., MD       Future Appointments             In 1 month Jerrol Banana., MD [facility], [place]   In 3 months [name], Kathlene November, MD [facility], [facility]

## 2019-11-25 NOTE — Progress Notes (Addendum)
Patient: Tracy Mann  Service Category: E/M  Provider: Gaspar Cola, MD  DOB: 1933/12/22  DOS: 11/26/2019  Referring Provider: Jerrol Banana.,*  MRN: [id number]  Setting: Ambulatory outpatient  PCP: Jerrol Banana., MD  Type: New Patient  Specialty: Interventional Pain Management    Location: Office  Delivery: Face-to-face     Primary Reason(s) for Visit: Encounter for initial evaluation of one or more chronic problems (new to examiner) potentially causing chronic pain, and posing a threat to normal musculoskeletal function. (Level of risk: High) CC: Back Pain  HPI  Tracy Mann is a 86 y.o. year old, female patient, who comes for the first time to our practice referred by Jerrol Banana.,* for our initial evaluation of her chronic pain. She has Hyperlipidemia; CAD, NATIVE VESSEL; Dyspnea on exertion; Obesity; Airway hyperreactivity; Calculus of kidney; Cervical muscle strain; PNA (pneumonia); Chest pressure; Bladder infection, chronic; Clinical depression; DDD (degenerative disc disease), lumbosacral; Accumulation of fluid in tissues; Anxiety, generalized; Acid reflux; Folliculitis; Hypothyroidism; Incomplete bladder emptying; Broken leg; Cannot sleep; Obstructive sleep apnea; Arthritis, degenerative; Onychia of finger; Psoriasis; Restless legs syndrome; Adult BMI 30+; Mixed Ischemic/Nonischemic Cardiomyopathy; Type 2 diabetes mellitus without complications ([place]); Chronic atrial fibrillation ([place]); Chronic diastolic heart failure ([place]); Chronic anticoagulation (Coumadin); Atrial fibrillation ([place]); Fatigue; Angina at rest [facility]); Angina pectoris ([place]); Benign hypertensive heart disease without congestive heart failure; Vitamin D deficiency; Lymphedema; Statin intolerance; Chronic venous insufficiency; History of multiple allergies; Chronic pain syndrome; Disorder of skeletal system; Problems influencing health status; Abnormal MRI, lumbar spine (08/22/2019); Lumbar facet  hypertrophy (Multilevel) (Bilateral); Lumbar central spinal stenosis (Severe) (L3-4) without neurogenic claudication; Grade 1 Anterolisthesis of lumbar spine (L4/L5); Lumbar facet arthropathy (L3-4 right-sided facet edema/joint effusion); Lumbar lateral recess stenosis (L4-5) (Right); Chronic low back pain (Bilateral) w/ sciatica (Bilateral); Lumbosacral radiculopathy at L5 (Bilateral); Chronic lower extremity pain (Bilateral); and Hypoglycemia associated with diabetes ([place]) on their problem list. Today she comes in for evaluation of her Back Pain  Pain Assessment: Location: Lower Back Radiating: pain radiaties down both hip to her ankle Onset: More than a month ago Duration: Chronic pain Quality: Aching, Pressure, Throbbing, Nagging, Constant Severity: 5 /10 (subjective, self-reported pain score)  Effect on ADL: limits my daily activities Timing: Constant Modifying factors: pain is control with meds, staying off my feet BP: (!) 109/44  HR: 90  Onset and Duration: Sudden and Present longer than 3 months Cause of pain: Unknown Severity: No change since onset, NAS-11 at its worse: 10/10, NAS-11 at its best: 2/10, NAS-11 now: 5/10 and NAS-11 on the average: 2/10 Timing: Morning, During activity or exercise and After activity or exercise Aggravating Factors: Bending, Lifiting, Prolonged standing, Squatting, Twisting and Walking Alleviating Factors: Medications, Resting and Sitting Associated Problems: Depression, Fatigue and Pain that wakes patient up Quality of Pain: Aching, Burning, Pressure-like and Sharp Previous Examinations or Tests: CT scan, MRI scan and X-rays Previous Treatments: Epidural steroid injections and Narcotic medications  According to the patient the primary area of pain is that of the lower extremities, bilaterally, with the right being worse than the left although she refers that both sides are fairly similar.  In the case of both lower extremities the pain goes all the  way down to the top of the foot and the big toe and what appears to be an L5 dermatomal distribution.  The patient denies any leg surgeries, recent x-rays, physical therapy, and she indicates having had a bilateral L4 transforaminal epidural steroid  injection done by Dr. Alba Destine on 09/12/2019 at [facility]  During the procedure the injected dexamethasone 1 cc + Xylocaine 1% (2 cc) on each side.  The patient indicated having attained good relief of the pain for several days.  There is no follow-up or procedure interpretation or evaluation after the 09/12/2019 encounter.  The patient secondary area pain is that of the hips, bilaterally, with the right being worse than the left.  Again the patient refers that they are both fairly similar in terms of the intensity.  She denies any hip surgery, nerve blocks, physical therapy, or x-rays of the hip.  The patient's third area pain is that of the lower back, bilaterally, with the right being worse than the left, and a lot of the pain being in the tailbone area.  She refers not having any bowel bladder incontinence but she does indicate having a lot of problems with her bladder secondary to interstitial cystitis.  She denies any back surgeries, but she does have a recent MRI of the lumbar spine.  A copy of this MRI was provided to the patient's daughter, who accompanied her during today's encounter.  Provocative physical exam today was positive for bilateral lumbar facet arthralgia as well as bilateral hip joint arthralgia on hyperextension on rotation maneuvers and Patrick maneuver, respectively.  The patient does have an antalgic gait and posture with significant cervical thoracic kyphosis and significant decreased range of motion of the cervical spine, especially on hyperextension.  Straight leg raise was significant in the sense that it did trigger pain in the lower back, but no radicular symptoms on either side.  The patient had difficulty with toe walking  and heel walking, mostly secondary to severe deconditioning at age 86.  However physical examination did demonstrate 4/5 muscle strength in all flexors and extensors of the lower extremity.  Patellar DTRs were significantly decreased bilaterally.  Achilles tendon DTRs were completely absent, bilaterally.  The patient brought in a CD given to her at the [facility] containing x-ray imaging of the cervical spine, thoracic spine, lumbar spine, and the knees.  As it is usually the case with the x-rays from the [facility], they did not have a radiologist report associated with it and therefore they were not very useful.  I have looked at them and clearly there is degenerative disc disease at multiple levels as well as osteoarthritis of the cervical, thoracic, lumbar spine, and the knees.  However, I am not a board-certified radiologist and therefore my interpretation of these x-rays should be considered limited.  I did go to "care everywhere", where there is documentation that the x-rays were done, but again there is no official report attached to the x-rays.  No report was found on the CD or in "care everywhere".  Today I took the time to provide the patient with information regarding my pain practice. The patient was informed that my practice is divided into two sections: an interventional pain management section, as well as a completely separate and distinct medication management section. I explained that I have procedure days for my interventional therapies, and evaluation days for follow-ups and medication management. Because of the amount of documentation required during both, they are kept separated. This means that there is the possibility that she may be scheduled for a procedure on one day, and medication management the next. I have also informed her that because of staffing and facility limitations, I no longer take patients for medication management only. To  illustrate the reasons for this, I  gave the patient the example of surgeons, and how inappropriate it would be to refer a patient to his/her care, just to write for the post-surgical antibiotics on a surgery done by a different surgeon.   Because interventional pain management is my board-certified specialty, the patient was informed that joining my practice means that they are open to any and all interventional therapies. I made it clear that this does not mean that they will be forced to have any procedures done. What this means is that I believe interventional therapies to be essential part of the diagnosis and proper management of chronic pain conditions. Therefore, patients not interested in these interventional alternatives will be better served under the care of a different practitioner.  The patient was also made aware of my Comprehensive Pain Management Safety Guidelines where by joining my practice, they limit all of their nerve blocks and joint injections to those done by our practice, for as long as we are retained to manage their care.   Historic Controlled Substance Pharmacotherapy Review  PMP and historical list of controlled substances: Hydrocodone/APAP 10/325, 1 tab p.o. 4 times daily; lorazepam 1 mg twice daily Current opioid analgesics:  Hydrocodone/APAP 10/325, 1 tab p.o. 4 times daily (last filled on 11/12/2019) MME/day: 40 mg/day  Historical Monitoring: The patient  reports no history of drug use. List of all UDS Test(s): No results found. List of other Serum/Urine Drug Screening Test(s):  No results found. Historical Background Evaluation: [place] PMP: PDMP reviewed during this encounter. Online review of the past 75-monthperiod conducted.             PMP NARX Score Report:  Narcotic: 471 Sedative: 430 Stimulant: 000 [place] Department of public safety, offender search: (Editor, commissioningInformation) Non-contributory Risk Assessment Profile: Aberrant behavior: None observed or detected today Risk factors for fatal opioid  overdose: None identified today PMP NARX Overdose Risk Score: 250 Fatal overdose hazard ratio (HR): Calculation deferred Non-fatal overdose hazard ratio (HR): Calculation deferred Risk of opioid abuse or dependence: 0.7-3.0% with doses ? 36 MME/day and 6.1-26% with doses ? 120 MME/day. Substance use disorder (SUD) risk level: See below  Pharmacologic Plan: As per protocol, I have not taken over any controlled substance management, pending the results of ordered tests and/or consults.            Initial impression: Pending review of available data and ordered tests.  Meds   Current Outpatient Medications:  .  ACCU-CHEK AVIVA PLUS test strip, USE WITH METER TO CHECK GLUCOSE BEFORE MEALS AND AT BEDTIME, Disp: 200 strip, Rfl: 12 .  estradiol (ESTRACE) 0.1 MG/GM vaginal cream, Place 1 Applicatorful vaginally at bedtime., Disp: 42.5 g, Rfl: 0 .  ezetimibe (ZETIA) 10 MG tablet, TAKE 1 TABLET BY MOUTH EVERY DAY, Disp: 90 tablet, Rfl: 3 .  flavoxATE (URISPAS) 100 MG tablet, Take 1 tablet (100 mg total) by mouth 2 (two) times daily as needed for bladder spasms., Disp: 60 tablet, Rfl: 11 .  fluticasone (FLONASE) 50 MCG/ACT nasal spray, SPRAY TWO SPRAYS IN EACH NOSTRIL ONCE DAILY, Disp: 48 mL, Rfl: 3 .  HYDROcodone-acetaminophen (NORCO) 10-325 MG tablet, Take 1 tablet by mouth every 6 (six) hours as needed. For after August 25,2021., Disp: 110 tablet, Rfl: 0 .  insulin glargine (LANTUS SOLOSTAR) 100 UNIT/ML Solostar Pen, Inject 20 units nightly between 8-10 PM. Adjust as directed. Max dose is 50 units daily., Disp: , Rfl:  .  insulin lispro (HUMALOG KWIKPEN)  100 UNIT/ML KwikPen, INJECT 10-20 UNITS THREE TIMES DAILY WITH EACH MEAL., Disp: 15 mL, Rfl: 12 .  Insulin Pen Needle (RELION PEN NEEDLE 31G/8MM) 31G X 8 MM MISC, Use with insulin pen three times daily, Disp: 100 each, Rfl: 12 .  levofloxacin (LEVAQUIN) 500 MG tablet, Take 1 tablet (500 mg total) by mouth daily., Disp: 5 tablet, Rfl: 0 .   levothyroxine (SYNTHROID) 125 MCG tablet, TAKE 1 TABLET (125 MCG TOTAL) BY MOUTH DAILY BEFORE BREAKFAST., Disp: 90 tablet, Rfl: 4 .  lisinopril (ZESTRIL) 2.5 MG tablet, TAKE 1 TABLET BY MOUTH EVERYDAY AT BEDTIME, Disp: 90 tablet, Rfl: 1 .  LORazepam (ATIVAN) 1 MG tablet, Take 1 tablet (1 mg total) by mouth 2 (two) times daily as needed., Disp: 60 tablet, Rfl: 1 .  Magnesium 500 MG CAPS, Take 500 mg by mouth daily. , Disp: , Rfl:  .  metolazone (ZAROXOLYN) 2.5 MG tablet, Take 1 tablet (2.5 mg total) by mouth 2 (two) times a week. TAKE 30 minutes before Torsemide, Disp: 26 tablet, Rfl: 1 .  metoprolol tartrate (LOPRESSOR) 25 MG tablet, Take 1 tablet (25 mg total) by mouth as needed (As needed once per day for heart rate persistently >100bpm)., Disp: 30 tablet, Rfl: 1 .  nitroGLYCERIN (NITROSTAT) 0.4 MG SL tablet, Place 1 tablet (0.4 mg total) under the tongue every 5 (five) minutes as needed for chest pain., Disp: 25 tablet, Rfl: 3 .  NONFORMULARY OR COMPOUNDED ITEM, Diclofenac/Baclofen/Bu/Gabapentin Compounded Cream, Disp: , Rfl:  .  phenazopyridine (PYRIDIUM) 200 MG tablet, Take 1 tablet (200 mg total) by mouth 3 (three) times daily as needed for pain., Disp: 10 tablet, Rfl: 0 .  potassium chloride SA (KLOR-CON) 20 MEQ tablet, Take 1-2 tablets with metolazone, Disp: , Rfl:  .  torsemide (DEMADEX) 20 MG tablet, Take 1 tablet (20 mg total) by mouth daily., Disp: 90 tablet, Rfl: 1 .  Vitamin D, Ergocalciferol, (DRISDOL) 50000 units CAPS capsule, Take 50,000 Units by mouth. Going to restart, Disp: , Rfl:  .  warfarin (COUMADIN) 4 MG tablet, TAKE 1 TABLET BY MOUTH EVERY DAY (Patient taking differently: Pt alter dose everyday), Disp: 90 tablet, Rfl: 4  Imaging Review  Lumbosacral Imaging: Lumbar MR wo contrast: Results for orders placed during the hospital encounter of 08/20/19 MR LUMBAR SPINE WO CONTRAST  Narrative CLINICAL DATA:  Chronic pain in the low back, hips, and lower legs.  EXAM: MRI  LUMBAR SPINE WITHOUT CONTRAST  TECHNIQUE: Multiplanar, multisequence MR imaging of the lumbar spine was performed. No intravenous contrast was administered.  COMPARISON:  03/05/2008  FINDINGS: Segmentation:  Standard.  Alignment:  Chronic trace anterolisthesis of L4 on L5.  Vertebrae: No fracture or suspicious osseous lesion. New right-sided facet edema at L3-4 with a small joint effusion, degenerative in appearance. Slight enlargement of benign hemangiomas in the L5 and S1 vertebral bodies.  Conus medullaris and cauda equina: Conus extends to the T12 level. Conus and cauda equina appear normal.  Paraspinal and other soft tissues: Slight enlargement of a few small T2 hyperintense lesions in the left kidney, likely cysts.  Disc levels:  Disc desiccation throughout the lumbar spine. Mild multilevel disc space narrowing, greatest at L4-5.  L1-2: Minimal disc bulging, stable to minimally increased. No stenosis.  L2-3: Mildly increased disc bulging and mild facet and ligamentum flavum hypertrophy without significant stenosis.  L3-4: Disc bulging, a new small central disc extrusion with slight superior migration, congenitally short pedicles, mild ligamentum flavum hypertrophy, and moderate to severe  facet hypertrophy result in progressive severe spinal stenosis with potential bilateral L4 nerve root impingement in the lateral recesses. No neural foraminal stenosis.  L4-5: Anterolisthesis with mild bulging of uncovered disc and severe facet arthrosis result in mild spinal stenosis and moderate right lateral recess stenosis, mildly progressed. No neural foraminal stenosis.  L5-S1: Progressive moderate right greater than left facet arthrosis without disc herniation or stenosis.  IMPRESSION: 1. Progressive lumbar disc and facet degeneration, most notable at L3-4 where there is now severe spinal stenosis. 2. Moderate right lateral recess stenosis at L4-5.  Electronically  Signed By: Logan Bores M.D. On: 08/22/2019 08:50  Complexity Note: Imaging results reviewed. Results shared with Tracy Mann, using Layman's terms.                        ROS  Cardiovascular: Heart trouble and Heart failure Pulmonary or Respiratory: Difficulty blowing air out (Emphysema), Snoring  and Coughing up mucus (Bronchitis) Neurological: No reported neurological signs or symptoms such as seizures, abnormal skin sensations, urinary and/or fecal incontinence, being born with an abnormal open spine and/or a tethered spinal cord Psychological-Psychiatric: Anxiousness Gastrointestinal: Heartburn due to stomach pushing into lungs (Hiatal hernia) Genitourinary: Peeing blood Hematological: No reported hematological signs or symptoms such as prolonged bleeding, low or poor functioning platelets, bruising or bleeding easily, hereditary bleeding problems, low energy levels due to low hemoglobin or being anemic Endocrine: High blood sugar controlled without the use of insulin (NIDDM) and High thyroid Rheumatologic: No reported rheumatological signs and symptoms such as fatigue, joint pain, tenderness, swelling, redness, heat, stiffness, decreased range of motion, with or without associated rash Musculoskeletal: Negative for myasthenia gravis, muscular dystrophy, multiple sclerosis or malignant hyperthermia  Allergies  Tracy Mann is allergic to duloxetine; other; paroxetine; sulfa antibiotics; cephalexin; clarithromycin; diphenhydramine; estrogens conjugated; estrogens, conjugated; sulfonamide derivatives; and nitrofurantoin.  Laboratory Chemistry Profile   Renal Lab Results  Component Value Date   BUN 26 07/29/2019   CREATININE 0.79 07/29/2019   BCR 33 (H) 07/29/2019   GFRAA 79 07/29/2019   GFRNONAA 69 07/29/2019   SPECGRAV  10/31/2019     Comment:     n/a   PHUR 5.0 10/31/2019   PROTEINUR  10/31/2019     Comment:     n/a     Electrolytes Lab Results  Component Value Date   NA 142  07/29/2019   K 4.9 07/29/2019   CL 100 07/29/2019   CALCIUM 9.1 07/29/2019   MG 2.1 08/29/2018   PHOS 3.9 09/08/2014     Hepatic Lab Results  Component Value Date   AST 18 08/29/2018   ALT 13 08/29/2018   ALBUMIN 4.0 08/29/2018   ALKPHOS 99 08/29/2018     ID No results found.   Bone No results found.   Endocrine Lab Results  Component Value Date   GLUCOSE 152 (H) 07/29/2019   GLUCOSEU Negative 02/24/2019   HGBA1C 6.8 (A) 09/22/2019   TSH 0.888 08/29/2018     Neuropathy Lab Results  Component Value Date   HGBA1C 6.8 (A) 09/22/2019     CNS No results found.   Inflammation (CRP: Acute  ESR: Chronic) Lab Results  Component Value Date   ESRSEDRATE 45 (H) 01/17/2019     Rheumatology No results found.   Coagulation Lab Results  Component Value Date   INR 4.3 (A) 11/19/2019   LABPROT 18.9 (H) 12/15/2016   PLT 225 08/29/2018     Cardiovascular Lab Results  Component Value Date   BNP 65.9 02/26/2018   CKTOTAL 87 01/17/2019   TROPONINI <0.03 05/21/2015   HGB 13.5 08/29/2018   HCT 41.5 08/29/2018     Screening No results found.   Cancer No results found for: CEA, CA125, LABCA2   Allergens No results found.     Note: Lab results reviewed.  [place]  Drug: Tracy Mann  reports no history of drug use. Alcohol:  reports no history of alcohol use. Tobacco:  reports that she has never smoked. She has never used smokeless tobacco. Medical:  has a past medical history of Acute myocardial infarction of inferior wall ([place]), Arthritis, Chronic anticoagulation, Chronic atrial fibrillation ([place]), Chronic combined systolic and diastolic CHF (congestive heart failure) ([place]), Essential hypertension, Hyperlipidemia, mixed, Mixed Ischemic/Nonischemic Cardiomyopathy, Nonobstructive coronary artery disease, Obesity, Thyroid disease, Type II diabetes mellitus ([place]), Venous insufficiency, and Vitamin D deficiency. Family: family history includes Heart disease in her mother;  Thyroid disease in her father.  Past Surgical History:  Procedure Laterality Date  . CARDIAC CATHETERIZATION  11/2012   [facility];EF 45-50%  . CHOLECYSTECTOMY  1999  . TUBAL LIGATION  1968  . VESICOVAGINAL FISTULA CLOSURE W/ TAH  1996   Active Ambulatory Problems    Diagnosis Date Noted  . Hyperlipidemia 09/16/2008  . CAD, NATIVE VESSEL 09/16/2008  . Dyspnea on exertion 02/01/2010  . Obesity 11/02/2010  . Airway hyperreactivity 07/08/2014  . Calculus of kidney 10/31/2011  . Cervical muscle strain 07/08/2014  . PNA (pneumonia) 07/08/2014  . Chest pressure 07/08/2014  . Bladder infection, chronic 10/31/2011  . Clinical depression 07/08/2014  . DDD (degenerative disc disease), lumbosacral 07/08/2014  . Accumulation of fluid in tissues 07/08/2014  . Anxiety, generalized 07/08/2014  . Acid reflux 07/08/2014  . Folliculitis 67/67/2094  . Hypothyroidism 09/04/2013  . Incomplete bladder emptying 10/31/2011  . Broken leg 07/08/2014  . Cannot sleep 07/08/2014  . Obstructive sleep apnea 07/08/2014  . Arthritis, degenerative 07/08/2014  . Onychia of finger 07/08/2014  . Psoriasis 07/08/2014  . Restless legs syndrome 07/08/2014  . Adult BMI 30+ 07/08/2014  . Mixed Ischemic/Nonischemic Cardiomyopathy   . Type 2 diabetes mellitus without complications ([place]) 70/96/2836  . Chronic atrial fibrillation ([place])   . Chronic diastolic heart failure ([place]) 02/08/2009  . Chronic anticoagulation (Coumadin)   . Atrial fibrillation ([place]) 09/16/2008  . Fatigue 03/03/2015  . Angina at rest [facility]) 05/20/2015  . Angina pectoris ([place]) 06/07/2015  . Benign hypertensive heart disease without congestive heart failure 09/16/2008  . Vitamin D deficiency 05/21/2019  . Lymphedema 06/25/2019  . Statin intolerance 06/29/2019  . Chronic venous insufficiency 07/27/2019  . History of multiple allergies 11/25/2019  . Chronic pain syndrome 11/25/2019  . Disorder of skeletal system 11/25/2019  . Problems influencing  health status 11/25/2019  . Abnormal MRI, lumbar spine (08/22/2019) 11/25/2019  . Lumbar facet hypertrophy (Multilevel) (Bilateral) 11/25/2019  . Lumbar central spinal stenosis (Severe) (L3-4) without neurogenic claudication 11/25/2019  . Grade 1 Anterolisthesis of lumbar spine (L4/L5) 11/25/2019  . Lumbar facet arthropathy (L3-4 right-sided facet edema/joint effusion) 11/25/2019  . Lumbar lateral recess stenosis (L4-5) (Right) 11/26/2019  . Chronic low back pain (Bilateral) w/ sciatica (Bilateral) 11/26/2019  . Lumbosacral radiculopathy at L5 (Bilateral) 11/26/2019  . Chronic lower extremity pain (Bilateral) 11/26/2019  . Hypoglycemia associated with diabetes ([place]) 11/26/2019   Resolved Ambulatory Problems    Diagnosis Date Noted  . Absolute anemia 07/08/2014   Past Medical History:  Diagnosis Date  . Acute myocardial infarction of  inferior wall ([place])   . Arthritis   . Chronic combined systolic and diastolic CHF (congestive heart failure) ([place])   . Essential hypertension   . Hyperlipidemia, mixed   . Nonobstructive coronary artery disease   . Thyroid disease   . Type II diabetes mellitus ([place])   . Venous insufficiency    Constitutional Exam  General appearance: Well nourished, well developed, and well hydrated. In no apparent acute distress Vitals:   11/26/19 1308  BP: (!) 109/44  Pulse: 90  Temp: (!) 97.4 F (36.3 C)  SpO2: 94%  Weight: 219 lb (99.3 kg)  Height: _0  (1.676 m)   BMI Assessment: Estimated body mass index is 35.35 kg/m as calculated from the following:   Height as of this encounter: _1  (1.676 m).   Weight as of this encounter: 219 lb (99.3 kg).  BMI interpretation table: BMI level Category Range association with higher incidence of chronic pain  <18 kg/m2 Underweight   18.5-24.9 kg/m2 Ideal body weight   25-29.9 kg/m2 Overweight Increased incidence by 20%  30-34.9 kg/m2 Obese (Class I) Increased incidence by 68%  35-39.9 kg/m2 Severe obesity  (Class II) Increased incidence by 136%  >40 kg/m2 Extreme obesity (Class III) Increased incidence by 254%   Patient's current BMI Ideal Body weight  Body mass index is 35.35 kg/m. Ideal body weight: 59.3 kg (130 lb 11.7 oz) Adjusted ideal body weight: 75.3 kg (166 lb 0.6 oz)   BMI Readings from Last 4 Encounters:  11/26/19 35.35 kg/m  11/13/19 35.89 kg/m  11/12/19 36.15 kg/m  10/31/19 36.94 kg/m   Wt Readings from Last 4 Encounters:  11/26/19 219 lb (99.3 kg)  11/13/19 219 lb (99.3 kg)  11/12/19 220 lb 9.6 oz (100.1 kg)  10/31/19 225 lb 6.4 oz (102.2 kg)    Psych/Mental status: Alert, oriented x 3 (person, place, & time) She is a little bit hard of hearing. Eyes: PERLA Respiratory: No evidence of acute respiratory distress  Cervical Spine Exam  Skin & Axial Inspection: No masses, redness, edema, swelling, or associated skin lesions Alignment: Significant cervical thoracic kyphosis Functional ROM: Decreased ROM, bilaterally Stability: No instability detected Muscle Tone/Strength: Functionally intact. No obvious neuro-muscular anomalies detected. Sensory (Neurological): Unimpaired Palpation: No palpable anomalies              Upper Extremity (UE) Exam    Side: Right upper extremity  Side: Left upper extremity  Skin & Extremity Inspection: Skin color, temperature, and hair growth are WNL. No peripheral edema or cyanosis. No masses, redness, swelling, asymmetry, or associated skin lesions. No contractures.  Skin & Extremity Inspection: Skin color, temperature, and hair growth are WNL. No peripheral edema or cyanosis. No masses, redness, swelling, asymmetry, or associated skin lesions. No contractures.  Functional ROM: Unrestricted ROM          Functional ROM: Unrestricted ROM          Muscle Tone/Strength: Functionally intact. No obvious neuro-muscular anomalies detected.   Muscle Tone/Strength: Functionally intact. No obvious neuro-muscular anomalies detected.  Sensory  (Neurological): Unimpaired          Sensory (Neurological): Unimpaired          Palpation: No palpable anomalies              Palpation: No palpable anomalies              Provocative Test(s):  Phalen's test: deferred Tinel's test: deferred Apley's scratch test (touch opposite shoulder):  Action 1 (Across  chest): deferred Action 2 (Overhead): deferred Action 3 (LB reach): deferred   Provocative Test(s):  Phalen's test: deferred Tinel's test: deferred Apley's scratch test (touch opposite shoulder):  Action 1 (Across chest): deferred Action 2 (Overhead): deferred Action 3 (LB reach): deferred    Thoracic Spine Area Exam  Skin & Axial Inspection: No masses, redness, or swelling Alignment: Kyphosis observed. Functional ROM: Decreased ROM Stability: No instability detected Muscle Tone/Strength: Functionally intact. No obvious neuro-muscular anomalies detected. Sensory (Neurological): Unimpaired Muscle strength & Tone: No palpable anomalies  Lumbar Exam  Skin & Axial Inspection: No masses, redness, or swelling Alignment: Asymmetric Functional ROM: Decreased ROM affecting both sides Stability: No instability detected Muscle Tone/Strength: Guarding detected Sensory (Neurological): Movement-associated pain Palpation: Complains of area being tender to palpation       Provocative Tests: Hyperextension/rotation test: (+) bilaterally for facet joint pain. Lumbar quadrant test (Kemp's test): (+) bilaterally for facet joint pain. Lateral bending test: deferred today       Patrick's Maneuver: (+) for bilateral hip arthralgia             FABER* test: (+) for bilateral hip arthralgia             S-I anterior distraction/compression test: deferred today         S-I lateral compression test: deferred today         S-I Thigh-thrust test: deferred today         S-I Gaenslen's test: deferred today         *(Flexion, ABduction and External Rotation)  Gait & Posture Assessment  Ambulation:  Patient came in today in a wheel chair Gait: Age-related, senile gait pattern.  However, she does present with some evidence of an antalgic gait. Posture: Difficulty standing up straight, due to pain   Lower Extremity Exam    Side: Right lower extremity  Side: Left lower extremity  Stability: No instability observed          Stability: No instability observed          Skin & Extremity Inspection: Skin color, temperature, and hair growth are WNL. No peripheral edema or cyanosis. No masses, redness, swelling, asymmetry, or associated skin lesions. No contractures.  Skin & Extremity Inspection: Skin color, temperature, and hair growth are WNL. No peripheral edema or cyanosis. No masses, redness, swelling, asymmetry, or associated skin lesions. No contractures.  Functional ROM: Decreased ROM for hip and knee joints Limited SLR (straight leg raise)  Functional ROM: Decreased ROM for hip and knee joints Limited SLR (straight leg raise)  Muscle Tone/Strength: Functionally intact. No obvious neuro-muscular anomalies detected.  Muscle Tone/Strength: Functionally intact. No obvious neuro-muscular anomalies detected.  Sensory (Neurological): Dermatomal pain pattern top of foot & big toe (L5)  Sensory (Neurological): Dermatomal pain pattern top of foot & big toe (L5)  DTR: Patellar: 1+: trace Achilles: 0: absent Plantar: deferred today  DTR: Patellar: 1+: trace Achilles: 0: absent Plantar: deferred today  Palpation: No palpable anomalies  Palpation: No palpable anomalies   Assessment  Primary Diagnosis & Pertinent Problem List: The primary encounter diagnosis was Chronic pain syndrome. 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  Visit Diagnosis (New problems to examiner): 1. Chronic pain syndrome   2. Chronic low back pain (Bilateral) w/ sciatica (Bilateral)   3. Chronic lower extremity pain (Bilateral)   4. Lumbosacral radiculopathy at L5 (Bilateral)   5. Lumbar facet hypertrophy (Multilevel) (Bilateral)   6. Lumbar facet arthropathy (L3-4 right-sided facet edema/joint effusion)   7. Grade 1 Anterolisthesis of lumbar spine (L4/L5)   8. Lumbar lateral recess stenosis (L4-5) (Right)   9. Lumbar central spinal stenosis (Severe) (L3-4) without neurogenic claudication   10. Abnormal MRI, lumbar spine (08/22/2019)   11. Disorder of skeletal system   12. Problems influencing health status   13. Chronic anticoagulation (Coumadin)   14. Vitamin D deficiency   15. History of multiple allergies   16. Hypoglycemia associated with diabetes ([place])    Plan of Care (Initial workup plan)  Note: Tracy Mann was reminded that as per protocol, today's visit has been an evaluation only. We have not taken over the patient's controlled substance management.  Problem-specific plan: No problem-specific Assessment & Plan notes found for this encounter.   Lab Orders     Compliance Drug Analysis, Ur     Magnesium     Vitamin B12     Sedimentation rate     25-Hydroxy vitamin D Lcms D2+D3     C-reactive protein     Glucose, capillary  Imaging Orders     DG Lumbar Spine Complete W/Bend Referral Orders  No referral(s) requested today   Procedure Orders    No procedure(s) ordered today   Pharmacotherapy  (current): Medications ordered:  No orders of the defined types were placed in this encounter.  Medications administered during this visit: Shauntel Prest. Posa had no medications administered during this visit.   Pharmacological management options:  Opioid Analgesics: The patient was informed that there is no guarantee that she would be a candidate for opioid analgesics. The decision will be made following CDC guidelines. This decision will be based on the results of diagnostic studies, as well as Tracy Mann's risk profile.   Membrane stabilizer: To be determined at a later time  Muscle relaxant: To be determined at a later time  NSAID: To be determined at a later time  Other analgesic(s): To be determined at a later time   Interventional management options: Tracy Mann was informed that there is no guarantee that she would be a candidate for interventional therapies. The decision will be based on the results of diagnostic studies, as well as Tracy Mann's risk profile.  Procedure(s) under consideration:  Diagnostic bilateral L4 transforaminal ESI x1 done on 09/12/2019 by Dr. Alba Destine at the [facility] Diagnostic midline caudal ESI #1 + diagnostic epidurogram  Diagnostic bilateral IA hip joint injection #1  Diagnostic midline L3-4 LESI #1  Diagnostic bilateral L3 transforaminal ESI #1    Provider-requested follow-up: Return for (40-min), (F2F), (2V-Never on procedure day) Follow-up, (s/p Tests).  Future Appointments  Date Time Provider [place]  12/03/2019 10:00 AM [facility] NURSE [facility]  12/08/2019  1:00 PM Milinda Pointer, MD [facility] None  12/11/2019 10:00 AM [facility]  01/15/2020  1:20 PM Jerrol Banana., MD [facility]  03/15/2020  1:40 PM Rockey Situ, Kathlene November, MD [id number]    Note by: Gaspar Cola, MD Date: 11/26/2019; Time: 5:55 AM

## 2019-11-25 NOTE — Telephone Encounter (Addendum)
error:[id number] ° °

## 2019-11-26 ENCOUNTER — Other Ambulatory Visit: Payer: Self-pay

## 2019-11-26 ENCOUNTER — Ambulatory Visit (HOSPITAL_BASED_OUTPATIENT_CLINIC_OR_DEPARTMENT_OTHER): Payer: Medicare Other | Admitting: Pain Medicine

## 2019-11-26 ENCOUNTER — Ambulatory Visit
Admission: RE | Admit: 2019-11-26 | Discharge: 2019-11-26 | Disposition: A | Discharge status: Home / Self Care | Payer: Medicare Other | Source: Ambulatory Visit | Attending: Pain Medicine | Admitting: Pain Medicine

## 2019-11-26 ENCOUNTER — Ambulatory Visit
Admission: RE | Admit: 2019-11-26 | Discharge: 2019-11-26 | Disposition: A | Discharge status: Home / Self Care | Payer: Medicare Other | Attending: Pain Medicine | Admitting: Pain Medicine

## 2019-11-26 ENCOUNTER — Encounter: Payer: Self-pay | Admitting: Pain Medicine

## 2019-11-26 VITALS — BP 109/44 | HR 90 | Temp 97.4°F | Ht 66.0 in | Wt 219.0 lb

## 2019-11-26 DIAGNOSIS — E119 Type 2 diabetes mellitus without complications: Secondary | ICD-10-CM | POA: Insufficient documentation

## 2019-11-26 DIAGNOSIS — M5442 Lumbago with sciatica, left side: Secondary | ICD-10-CM | POA: Insufficient documentation

## 2019-11-26 DIAGNOSIS — M4316 Spondylolisthesis, lumbar region: Secondary | ICD-10-CM

## 2019-11-26 DIAGNOSIS — G8929 Other chronic pain: Secondary | ICD-10-CM

## 2019-11-26 DIAGNOSIS — I110 Hypertensive heart disease with heart failure: Secondary | ICD-10-CM | POA: Insufficient documentation

## 2019-11-26 DIAGNOSIS — Z789 Other specified health status: Secondary | ICD-10-CM | POA: Diagnosis not present

## 2019-11-26 DIAGNOSIS — M5441 Lumbago with sciatica, right side: Secondary | ICD-10-CM

## 2019-11-26 DIAGNOSIS — E559 Vitamin D deficiency, unspecified: Secondary | ICD-10-CM

## 2019-11-26 DIAGNOSIS — E785 Hyperlipidemia, unspecified: Secondary | ICD-10-CM | POA: Insufficient documentation

## 2019-11-26 DIAGNOSIS — Z794 Long term (current) use of insulin: Secondary | ICD-10-CM | POA: Insufficient documentation

## 2019-11-26 DIAGNOSIS — M47816 Spondylosis without myelopathy or radiculopathy, lumbar region: Secondary | ICD-10-CM

## 2019-11-26 DIAGNOSIS — I2510 Atherosclerotic heart disease of native coronary artery without angina pectoris: Secondary | ICD-10-CM | POA: Insufficient documentation

## 2019-11-26 DIAGNOSIS — G894 Chronic pain syndrome: Secondary | ICD-10-CM

## 2019-11-26 DIAGNOSIS — Z7901 Long term (current) use of anticoagulants: Secondary | ICD-10-CM | POA: Insufficient documentation

## 2019-11-26 DIAGNOSIS — M5417 Radiculopathy, lumbosacral region: Secondary | ICD-10-CM | POA: Insufficient documentation

## 2019-11-26 DIAGNOSIS — Z889 Allergy status to unspecified drugs, medicaments and biological substances status: Secondary | ICD-10-CM

## 2019-11-26 DIAGNOSIS — Z9189 Other specified personal risk factors, not elsewhere classified: Secondary | ICD-10-CM | POA: Insufficient documentation

## 2019-11-26 DIAGNOSIS — Z79899 Other long term (current) drug therapy: Secondary | ICD-10-CM | POA: Insufficient documentation

## 2019-11-26 DIAGNOSIS — M48061 Spinal stenosis, lumbar region without neurogenic claudication: Secondary | ICD-10-CM

## 2019-11-26 DIAGNOSIS — E11649 Type 2 diabetes mellitus with hypoglycemia without coma: Secondary | ICD-10-CM

## 2019-11-26 DIAGNOSIS — M899 Disorder of bone, unspecified: Secondary | ICD-10-CM

## 2019-11-26 DIAGNOSIS — M79604 Pain in right leg: Secondary | ICD-10-CM | POA: Insufficient documentation

## 2019-11-26 DIAGNOSIS — R937 Abnormal findings on diagnostic imaging of other parts of musculoskeletal system: Secondary | ICD-10-CM | POA: Insufficient documentation

## 2019-11-26 DIAGNOSIS — I5042 Chronic combined systolic (congestive) and diastolic (congestive) heart failure: Secondary | ICD-10-CM | POA: Diagnosis not present

## 2019-11-26 DIAGNOSIS — M79605 Pain in left leg: Secondary | ICD-10-CM

## 2019-11-26 LAB — GLUCOSE, CAPILLARY: Glucose-Capillary: 76 mg/dL (ref 70–99)

## 2019-11-26 NOTE — Patient Instructions (Signed)
____________________________________________________________________________________________  General Risks and Possible Complications  Patient Responsibilities: It is important that you read this as it is part of your informed consent. It is our duty to inform you of the risks and possible complications associated with treatments offered to you. It is your responsibility as a patient to read this and to ask questions about anything that is not clear or that you believe was not covered in this document.  Patient's Rights: You have the right to refuse treatment. You also have the right to change your mind, even after initially having agreed to have the treatment done. However, under this last option, if you wait until the last second to change your mind, you may be charged for the materials used up to that point.  Introduction: Medicine is not an Chief Strategy Officer. Everything in Medicine, including the lack of treatment(s), carries the potential for danger, harm, or loss (which is by definition: Risk). In Medicine, a complication is a secondary problem, condition, or disease that can aggravate an already existing one. All treatments carry the risk of possible complications. The fact that a side effects or complications occurs, does not imply that the treatment was conducted incorrectly. It must be clearly understood that these can happen even when everything is done following the highest safety standards.  No treatment: You can choose not to proceed with the proposed treatment alternative. The "PRO(s)" would include: avoiding the risk of complications associated with the therapy. The "CON(s)" would include: not getting any of the treatment benefits. These benefits fall under one of three categories: diagnostic; therapeutic; and/or palliative. Diagnostic benefits include: getting information which can ultimately lead to improvement of the disease or symptom(s). Therapeutic benefits are those associated with the  successful treatment of the disease. Finally, palliative benefits are those related to the decrease of the primary symptoms, without necessarily curing the condition (example: decreasing the pain from a flare-up of a chronic condition, such as incurable terminal cancer).  General Risks and Complications: These are associated to most interventional treatments. They can occur alone, or in combination. They fall under one of the following six (6) categories: no benefit or worsening of symptoms; bleeding; infection; nerve damage; allergic reactions; and/or death. 1. No benefits or worsening of symptoms: In Medicine there are no guarantees, only probabilities. No healthcare provider can ever guarantee that a medical treatment will work, they can only state the probability that it may. Furthermore, there is always the possibility that the condition may worsen, either directly, or indirectly, as a consequence of the treatment. 2. Bleeding: This is more common if the patient is taking a blood thinner, either prescription or over the counter (example: Goody Powders, Fish oil, Aspirin, Garlic, etc.), or if suffering a condition associated with impaired coagulation (example: Hemophilia, cirrhosis of the liver, low platelet counts, etc.). However, even if you do not have one on these, it can still happen. If you have any of these conditions, or take one of these drugs, make sure to notify your treating physician. 3. Infection: This is more common in patients with a compromised immune system, either due to disease (example: diabetes, cancer, human immunodeficiency virus [HIV], etc.), or due to medications or treatments (example: therapies used to treat cancer and rheumatological diseases). However, even if you do not have one on these, it can still happen. If you have any of these conditions, or take one of these drugs, make sure to notify your treating physician. 4. Nerve Damage: This is more common when the  treatment is  an invasive one, but it can also happen with the use of medications, such as those used in the treatment of cancer. The damage can occur to small secondary nerves, or to large primary ones, such as those in the spinal cord and brain. This damage may be temporary or permanent and it may lead to impairments that can range from temporary numbness to permanent paralysis and/or brain death. 5. Allergic Reactions: Any time a substance or material comes in contact with our body, there is the possibility of an allergic reaction. These can range from a mild skin rash (contact dermatitis) to a severe systemic reaction (anaphylactic reaction), which can result in death. 6. Death: In general, any medical intervention can result in death, most of the time due to an unforeseen complication. ____________________________________________________________________________________________  ____________________________________________________________________________________________  Preparing for Procedure with Sedation  Procedure appointments are limited to planned procedures: . No Prescription Refills. . No disability issues will be discussed. . No medication changes will be discussed.  Instructions: . Oral Intake: Do not eat or drink anything for at least 8 hours prior to your procedure. (Exception: Blood Pressure Medication. See below.) . Transportation: Unless otherwise stated by your physician, you may drive yourself after the procedure. . Blood Pressure Medicine: Do not forget to take your blood pressure medicine with a sip of water the morning of the procedure. If your Diastolic (lower reading)is above 100 mmHg, elective cases will be cancelled/rescheduled. . Blood thinners: These will need to be stopped for procedures. Notify our staff if you are taking any blood thinners. Depending on which one you take, there will be specific instructions on how and when to stop it. . Diabetics on insulin: Notify the staff  so that you can be scheduled 1st case in the morning. If your diabetes requires high dose insulin, take only  of your normal insulin dose the morning of the procedure and notify the staff that you have done so. . Preventing infections: Shower with an antibacterial soap the morning of your procedure. . Build-up your immune system: Take 1000 mg of Vitamin C with every meal (3 times a day) the day prior to your procedure. Marland Kitchen Antibiotics: Inform the staff if you have a condition or reason that requires you to take antibiotics before dental procedures. . Pregnancy: If you are pregnant, call and cancel the procedure. . Sickness: If you have a cold, fever, or any active infections, call and cancel the procedure. . Arrival: You must be in the facility at least 30 minutes prior to your scheduled procedure. . Children: Do not bring children with you. . Dress appropriately: Bring dark clothing that you would not mind if they get stained. . Valuables: Do not bring any jewelry or valuables.  Reasons to call and reschedule or cancel your procedure: (Following these recommendations will minimize the risk of a serious complication.) . Surgeries: Avoid having procedures within 2 weeks of any surgery. (Avoid for 2 weeks before or after any surgery). . Flu Shots: Avoid having procedures within 2 weeks of a flu shots or . (Avoid for 2 weeks before or after immunizations). . Barium: Avoid having a procedure within 7-10 days after having had a radiological study involving the use of radiological contrast. (Myelograms, Barium swallow or enema study). . Heart attacks: Avoid any elective procedures or surgeries for the initial 6 months after a "Myocardial Infarction" (Heart Attack). . Blood thinners: It is imperative that you stop these medications before procedures. Let us know if you  if you take any blood thinner.  . Infection: Avoid procedures during or within two weeks of an infection (including chest colds or  gastrointestinal problems). Symptoms associated with infections include: Localized redness, fever, chills, night sweats or profuse sweating, burning sensation when voiding, cough, congestion, stuffiness, runny nose, sore throat, diarrhea, nausea, vomiting, cold or Flu symptoms, recent or current infections. It is specially important if the infection is over the area that we intend to treat. Marland Kitchen Heart and lung problems: Symptoms that may suggest an active cardiopulmonary problem include: cough, chest pain, breathing difficulties or shortness of breath, dizziness, ankle swelling, uncontrolled high or unusually low blood pressure, and/or palpitations. If you are experiencing any of these symptoms, cancel your procedure and contact your primary care physician for an evaluation.  Remember:  Regular Business hours are:  Monday to Thursday 8:00 AM to 4:00 PM  Provider's Schedule: Milinda Pointer, MD:  Procedure days: Tuesday and Thursday 7:30 AM to 4:00 PM  Gillis Santa, MD:  Procedure days: Monday and Wednesday 7:30 AM to 4:00 PM ____________________________________________________________________________________________    ______________________________________________________________________________________________  Weight Management Required  URGENT: Your weight has been found to be adversely affecting your health.  Dear Tracy Mann:  Your current Estimated body mass index is 35.89 kg/m as calculated from the following:   Height as of 11/13/19: 5' 5.5" (1.664 m).   Weight as of 11/13/19: 219 lb (99.3 kg).  Please use the table below to identify your weight category and associated incidence of chronic pain, secondary to your weight.  Body Mass Index (BMI) Classification BMI level (kg/m2) Category Associated incidence of chronic pain  <18  Underweight   18.5-24.9 Ideal body weight   25-29.9 Overweight  20%  30-34.9 Obese (Class I)  68%  35-39.9 Severe obesity (Class II)  136%  >40  Extreme obesity (Class III)  254%   In addition: You will be considered "Morbidly Obese", if your BMI is above 30 and you have one or more of the following conditions which are known to be caused and/or directly associated with obesity: 1.    Type 2 Diabetes (Which in turn can lead to cardiovascular diseases (CVD), stroke, peripheral vascular diseases (PVD), retinopathy, nephropathy, and neuropathy) 2.    Cardiovascular Disease (High Blood Pressure; Congestive Heart Failure; High Cholesterol; Coronary Artery Disease; Angina; or History of Heart Attacks) 3.    Breathing problems (Asthma; obesity-hypoventilation syndrome; obstructive sleep apnea; chronic inflammatory airway disease; reactive airway disease; or shortness of breath) 4.    Chronic kidney disease 5.    Liver disease (nonalcoholic fatty liver disease) 6.    High blood pressure 7.    Acid reflux (gastroesophageal reflux disease; heartburn) 8.    Osteoarthritis (OA) (with any of the following: hip pain; knee pain; and/or low back pain) 9.    Low back pain (Lumbar Facet Syndrome; and/or Degenerative Disc Disease) 10.  Hip pain (Osteoarthritis of hip) (For every 1 lbs of added body weight, there is a 2 lbs increase in pressure inside of each hip articulation. 1:2 mechanical relationship) 11.  Knee pain (Osteoarthritis of knee) (For every 1 lbs of added body weight, there is a 4 lbs increase in pressure inside of each knee articulation. 1:4 mechanical relationship) (patients with a BMI>30 kg/m2 were 6.8 times more likely to develop knee OA than normal-weight individuals) 12.  Cancer: Epidemiological studies have shown that obesity is a risk factor for: post-menopausal breast cancer; cancers of the endometrium, colon and kidney cancer; malignant adenomas of  the oesophagus. Obese subjects have an approximately 1.5-3.5-fold increased risk of developing these cancers compared with normal-weight subjects, and it has been estimated that between 15 and  45% of these cancers can be attributed to overweight. More recent studies suggest that obesity may also increase the risk of other types of cancer, including pancreatic, hepatic and gallbladder cancer. (Ref: Obesity and cancer. Pischon T, Nthlings U, [employer] H. Proc Nutr Soc. 2008 May;67(2):128-45. doi: 74.1287/O6767209470962836.) The International Agency for Research on Cancer (IARC) has identified 13 cancers associated with overweight and obesity: meningioma, multiple myeloma, adenocarcinoma of the esophagus, and cancers of the thyroid, postmenopausal breast cancer, gallbladder, stomach, liver, pancreas, kidney, ovaries, uterus, colon and rectal (colorectal) cancers. 67 percent of all cancers diagnosed in women and 24 percent of those diagnosed in men are associated with overweight and obesity.  Recommendation: At this point it is urgent that you take a step back and concentrate in loosing weight. Dedicate 100% of your efforts on this task. Nothing else will improve your health more than bringing your weight down and your BMI to less than 30. If you are here, you probably have chronic pain. We know that most chronic pain patients have difficulty exercising secondary to their pain. For this reason, you must rely on proper nutrition and diet in order to lose the weight. If your BMI is above 40, you should seriously consider bariatric surgery. A realistic goal is to lose 10% of your body weight over a period of 12 months.  Be honest to yourself, if over time you have unsuccessfully tried to lose weight, then it is time for you to seek professional help and to enter a medically supervised weight management program, and/or undergo bariatric surgery. Stop procrastinating.   Pain management considerations:  1.    Pharmacological Problems: Be advised that the use of opioid analgesics (oxycodone; hydrocodone; morphine; methadone; codeine; and all of their derivatives) have been associated with decreased metabolism and  weight gain.  For this reason, should we see that you are unable to lose weight while taking these medications, it may become necessary for [place] to taper down and indefinitely discontinue them.  2.    Technical Problems: The incidence of successful interventional therapies decreases as the patient's BMI increases. It is much more difficult to accomplish a safe and effective interventional therapy on a patient with a BMI above 35. 3.    Radiation Exposure Problems: The x-rays machine, used to accomplish injection therapies, will automatically increase their x-ray output in order to capture an appropriate bone image. This means that radiation exposure increases exponentially with the patient's BMI. (The higher the BMI, the higher the radiation exposure.) Although the level of radiation used at a given time is still safe to the patient, it is not for the physician and/or assisting staff. Unfortunately, radiation exposure is accumulative. Because physicians and the staff have to do procedures and be exposed on a daily basis, this can result in health problems such as cancer and radiation burns. Radiation exposure to the staff is monitored by the radiation batches that they wear. The exposure levels are reported back to the staff on a quarterly basis. Depending on levels of exposure, physicians and staff may be obligated by law to decrease this exposure. This means that they have the right and obligation to refuse providing therapies where they may be overexposed to radiation. For this reason, physicians may decline to offer therapies such as radiofrequency ablation or implants to patients with a BMI above  40. 4.    Current Trends: Be advised that the current trend is to no longer offer certain therapies to patients with a BMI equal to, or above 35, due to increase perioperative risks, increased technical procedural difficulties, and excessive radiation exposure to healthcare  personnel.  ______________________________________________________________________________________________

## 2019-11-26 NOTE — Progress Notes (Signed)
Safety precautions to be maintained throughout the outpatient stay will include: orient to surroundings, keep bed in low position, maintain call bell within reach at all times, provide assistance with transfer out of bed and ambulation.  

## 2019-12-01 LAB — COMPLIANCE DRUG ANALYSIS, UR

## 2019-12-03 ENCOUNTER — Telehealth: Payer: Self-pay | Admitting: Family Medicine

## 2019-12-03 ENCOUNTER — Ambulatory Visit (INDEPENDENT_AMBULATORY_CARE_PROVIDER_SITE_OTHER): Payer: Medicare Other

## 2019-12-03 ENCOUNTER — Other Ambulatory Visit: Payer: Self-pay

## 2019-12-03 DIAGNOSIS — I482 Chronic atrial fibrillation, unspecified: Secondary | ICD-10-CM

## 2019-12-03 LAB — SEDIMENTATION RATE: Sed Rate: 45 mm/hr — ABNORMAL HIGH (ref 0–40)

## 2019-12-03 LAB — VITAMIN B12: Vitamin B-12: 360 pg/mL (ref 232–1245)

## 2019-12-03 LAB — MAGNESIUM: Magnesium: 2.1 mg/dL (ref 1.6–2.3)

## 2019-12-03 LAB — 25-HYDROXY VITAMIN D LCMS D2+D3
25-Hydroxy, Vitamin D-2: 9.2 ng/mL
25-Hydroxy, Vitamin D-3: 34 ng/mL
25-Hydroxy, Vitamin D: 43 ng/mL

## 2019-12-03 LAB — POCT INR
INR: 2.4 (ref 2.0–3.0)
PT: 29

## 2019-12-03 LAB — C-REACTIVE PROTEIN: CRP: 4 mg/L (ref 0–10)

## 2019-12-03 NOTE — Telephone Encounter (Signed)
Medication Refill - Medication: NONFORMULARY OR COMPOUNDED ITEM     Preferred Pharmacy (with phone number or street name):  [facility], [place] - [place] Phone:  [phone]  Fax:  [phone]       Agent: Please be advised that RX refills may take up to 3 business days. We ask that you follow-up with your pharmacy.

## 2019-12-03 NOTE — Telephone Encounter (Signed)
° °  Notes to clinic: please review for dose and qty on this compounded cream    Requested Prescriptions  Pending Prescriptions Disp Refills   NONFORMULARY OR COMPOUNDED ITEM      Sig: Diclofenac/Baclofen/Bu/Gabapentin Compounded Cream      There is no refill protocol information for this order

## 2019-12-04 NOTE — Telephone Encounter (Signed)
I spoke to the pharmacist and called in the prescription.  This is a compounded prescription and I do not know the exact dosing.  It should be available at the pharmacy for the patient.

## 2019-12-05 ENCOUNTER — Other Ambulatory Visit: Payer: Self-pay | Admitting: *Deleted

## 2019-12-05 ENCOUNTER — Other Ambulatory Visit: Payer: Self-pay | Admitting: Family

## 2019-12-05 DIAGNOSIS — R002 Palpitations: Secondary | ICD-10-CM

## 2019-12-05 DIAGNOSIS — I5042 Chronic combined systolic (congestive) and diastolic (congestive) heart failure: Secondary | ICD-10-CM

## 2019-12-05 DIAGNOSIS — I482 Chronic atrial fibrillation, unspecified: Secondary | ICD-10-CM

## 2019-12-05 NOTE — Telephone Encounter (Addendum)
Patient states pharmacy denies receiving the below and would like nurse to call in with a verbal. Spoke with Sharyn Lull at the practice and she ensured she would follow up today

## 2019-12-05 NOTE — Telephone Encounter (Signed)
Diclofenac 0.4 mg.

## 2019-12-08 ENCOUNTER — Other Ambulatory Visit: Payer: Self-pay

## 2019-12-08 ENCOUNTER — Encounter: Payer: Self-pay | Admitting: Pain Medicine

## 2019-12-08 ENCOUNTER — Ambulatory Visit: Payer: Medicare Other | Attending: Pain Medicine | Admitting: Pain Medicine

## 2019-12-08 ENCOUNTER — Telehealth: Payer: Self-pay

## 2019-12-08 VITALS — BP 129/72 | HR 84 | Temp 98.4°F | Resp 18 | Ht 65.5 in | Wt 213.4 lb

## 2019-12-08 DIAGNOSIS — M47816 Spondylosis without myelopathy or radiculopathy, lumbar region: Secondary | ICD-10-CM

## 2019-12-08 DIAGNOSIS — G894 Chronic pain syndrome: Secondary | ICD-10-CM | POA: Diagnosis not present

## 2019-12-08 DIAGNOSIS — M8949 Other hypertrophic osteoarthropathy, multiple sites: Secondary | ICD-10-CM | POA: Diagnosis not present

## 2019-12-08 DIAGNOSIS — M79605 Pain in left leg: Secondary | ICD-10-CM

## 2019-12-08 DIAGNOSIS — M5441 Lumbago with sciatica, right side: Secondary | ICD-10-CM

## 2019-12-08 DIAGNOSIS — M47817 Spondylosis without myelopathy or radiculopathy, lumbosacral region: Secondary | ICD-10-CM

## 2019-12-08 DIAGNOSIS — M5442 Lumbago with sciatica, left side: Secondary | ICD-10-CM

## 2019-12-08 DIAGNOSIS — I482 Chronic atrial fibrillation, unspecified: Secondary | ICD-10-CM

## 2019-12-08 DIAGNOSIS — M159 Polyosteoarthritis, unspecified: Secondary | ICD-10-CM

## 2019-12-08 DIAGNOSIS — M48061 Spinal stenosis, lumbar region without neurogenic claudication: Secondary | ICD-10-CM | POA: Diagnosis not present

## 2019-12-08 DIAGNOSIS — M5417 Radiculopathy, lumbosacral region: Secondary | ICD-10-CM

## 2019-12-08 DIAGNOSIS — M150 Primary generalized (osteo)arthritis: Secondary | ICD-10-CM

## 2019-12-08 DIAGNOSIS — M25552 Pain in left hip: Secondary | ICD-10-CM | POA: Diagnosis not present

## 2019-12-08 DIAGNOSIS — Z7901 Long term (current) use of anticoagulants: Secondary | ICD-10-CM | POA: Diagnosis not present

## 2019-12-08 DIAGNOSIS — M79604 Pain in right leg: Secondary | ICD-10-CM | POA: Diagnosis not present

## 2019-12-08 DIAGNOSIS — M48062 Spinal stenosis, lumbar region with neurogenic claudication: Secondary | ICD-10-CM

## 2019-12-08 DIAGNOSIS — M1711 Unilateral primary osteoarthritis, right knee: Secondary | ICD-10-CM

## 2019-12-08 DIAGNOSIS — Z79899 Other long term (current) drug therapy: Secondary | ICD-10-CM | POA: Diagnosis not present

## 2019-12-08 DIAGNOSIS — M25551 Pain in right hip: Secondary | ICD-10-CM | POA: Insufficient documentation

## 2019-12-08 DIAGNOSIS — G8929 Other chronic pain: Secondary | ICD-10-CM

## 2019-12-08 MED ORDER — HYDROCODONE-ACETAMINOPHEN 10-325 MG PO TABS: 1.0000 | ORAL_TABLET | Freq: Four times a day (QID) | ORAL | 0 refills | Status: DC | PRN

## 2019-12-08 NOTE — Telephone Encounter (Signed)
I did speak to pharmacy who stated patient was requesting 0.4 mg of the Diclofenac, which was different then previous dose. Pharmacy was waiting on verbal ok from pt's pcp. Verbal ok was given and pt was advised.

## 2019-12-08 NOTE — Progress Notes (Signed)
PROVIDER NOTE: Information contained herein reflects review and annotations entered in association with encounter. Interpretation of such information and data should be left to medically-trained personnel. Information provided to patient can be located elsewhere in the medical record under "Patient Instructions". Document created using STT-dictation technology, any transcriptional errors that may result from process are unintentional.    Patient: Tracy Mann  Service Category: E/M  Provider: Gaspar Cola, MD  DOB: 1933-05-30  DOS: 12/08/2019  Specialty: Interventional Pain Management  MRN: [id number]  Setting: Ambulatory outpatient  PCP: Jerrol Banana., MD  Type: Established Patient    Referring Provider: Jerrol Banana.,*  Location: Office  Delivery: Face-to-face     Primary Reason(s) for Visit: Encounter for evaluation before starting new chronic pain management plan of care (Level of risk: moderate) CC: Hip Pain (bilateral), Pain (bilateral buttock), and Leg Pain (bilateral, below the knee to ankle and top of feet)  HPI  Tracy Mann is a 86 y.o. year old, female patient, who comes today for a follow-up evaluation to review the test results and decide on a treatment plan. She has Hyperlipidemia; CAD, NATIVE VESSEL; Dyspnea on exertion; Obesity; Airway hyperreactivity; Calculus of kidney; Cervical muscle strain; PNA (pneumonia); Chest pressure; Bladder infection, chronic; Clinical depression; DDD (degenerative disc disease), lumbosacral; Accumulation of fluid in tissues; Anxiety, generalized; Acid reflux; Folliculitis; Hypothyroidism; Incomplete bladder emptying; Broken leg; Cannot sleep; Obstructive sleep apnea; Onychia of finger; Psoriasis; Restless legs syndrome; Adult BMI 30+; Mixed Ischemic/Nonischemic Cardiomyopathy; Type 2 diabetes mellitus without complications ([place]); Chronic atrial fibrillation ([place]); Chronic diastolic heart failure ([place]); Chronic anticoagulation (Coumadin);  Atrial fibrillation ([place]); Fatigue; Angina at rest [facility]); Angina pectoris ([place]); Benign hypertensive heart disease without congestive heart failure; Vitamin D deficiency; Lymphedema; Statin intolerance; Chronic venous insufficiency; History of multiple allergies; Chronic pain syndrome; Disorder of skeletal system; Problems influencing health status; Abnormal MRI, lumbar spine (08/22/2019); Lumbar facet hypertrophy (Multilevel) (Bilateral); Lumbar central spinal stenosis (SEVERE) (L3-4) with neurogenic claudication; Grade 1 Anterolisthesis of lumbar spine (L4/L5); Lumbar facet arthropathy (L3-4 right-sided facet edema/joint effusion); Lumbar lateral recess stenosis (L4-5) (Right); Chronic low back pain (Bilateral) w/ sciatica (Bilateral); Lumbosacral radiculopathy at L5 (Bilateral); Chronic lower extremity pain (1ry area of Pain) (Bilateral); Hypoglycemia associated with diabetes ([place]); Pharmacologic therapy; Lumbosacral facet syndrome; Chronic hip pain (2ry area of Pain) (Bilateral) (R>L); Osteoarthritis involving multiple joints; Osteoarthritis of knee (Right); Osteoarthritis of facet joint of lumbar spine; and Osteoarthritis of lumbar spine on their problem list. Her primarily concern today is the Hip Pain (bilateral), Pain (bilateral buttock), and Leg Pain (bilateral, below the knee to ankle and top of feet)  Pain Assessment: Location: Right, Left Hip Radiating: buttocks, legs Onset: More than a month ago Duration: Chronic pain Quality: Aching, Sore Severity: 3 /10 (subjective, self-reported pain score)  Timing: Constant Modifying factors: rest, heat, medications, topicals BP: 129/72  HR: 84  Tracy Mann comes in today for a follow-up visit after her initial evaluation on 11/26/2019. Today we went over the results of her tests. These were explained in "Layman's terms". During today's appointment we went over my diagnostic impression, as well as the proposed treatment plan.  According to the patient  the primary area of pain is that of the lower extremities, bilaterally, with the right being worse than the left although she refers that both sides are fairly similar.  In the case of both lower extremities the pain goes all the way down to the top of the foot and the big  toe and what appears to be an L5 dermatomal distribution.  The patient denies any leg surgeries, recent x-rays, physical therapy, and she indicates having had a bilateral L4 transforaminal epidural steroid injection done by Dr. Alba Destine on 09/12/2019 at the [facility].  During the procedure the injected dexamethasone 1 cc + Xylocaine 1% (2 cc) on each side.  The patient indicated having attained good relief of the pain for several days.  There is no follow-up or procedure interpretation or evaluation after the 09/12/2019 encounter.  The patient secondary area pain is that of the hips, bilaterally, with the right being worse than the left.  Again the patient refers that they are both fairly similar in terms of the intensity.  She denies any hip surgery, nerve blocks, physical therapy, or x-rays of the hip.  The patient's third area pain is that of the lower back, bilaterally, with the right being worse than the left, and a lot of the pain being in the tailbone area.  She refers not having any bowel bladder incontinence but she does indicate having a lot of problems with her bladder secondary to interstitial cystitis.  She denies any back surgeries, but she does have a recent MRI of the lumbar spine.  A copy of this MRI was provided to the patient's daughter, who accompanied her during today's encounter.  Provocative physical exam today was positive for bilateral lumbar facet arthralgia as well as bilateral hip joint arthralgia on hyperextension on rotation maneuvers and Patrick maneuver, respectively.  The patient does have an antalgic gait and posture with significant cervical thoracic kyphosis and significant decreased range of motion  of the cervical spine, especially on hyperextension.  Straight leg raise was significant in the sense that it did trigger pain in the lower back, but no radicular symptoms on either side.  The patient had difficulty with toe walking and heel walking, mostly secondary to severe deconditioning at age 30.  However physical examination did demonstrate 4/5 muscle strength in all flexors and extensors of the lower extremity.  Patellar DTRs were significantly decreased bilaterally.  Achilles tendon DTRs were completely absent, bilaterally.  The patient brought in a CD given to her at the [facility] containing x-ray imaging of the cervical spine, thoracic spine, lumbar spine, and the knees.  As it is usually the case with the x-rays from the [facility], they did not have a radiologist report associated with it and therefore they were not very useful.  I have looked at them and clearly there is degenerative disc disease at multiple levels as well as osteoarthritis of the cervical, thoracic, lumbar spine, and the knees.  However, I am not a board-certified radiologist and therefore my interpretation of these x-rays should be considered limited.  I did go to "care everywhere", where there is documentation that the x-rays were done, but again there is no official report attached to the x-rays.  No report was found on the CD or in "care everywhere".  Today I spent a lot of time with the patient going over the results of her lab work and x-rays. It took a little longer than usual due to the patient's habit of interrupting as I was trying to provide her with information. Today I have shared the treatment plan with the patient who seems to have understood and accepted. The first step is to get clearance to stop her Coumadin for procedures. Coumadin would need to be stopped for 5 days prior to spinal procedures. The patient  indicated that her primary pain is that of the lower extremities and therefore it is likely to  be secondary to her severe L3-4 central spinal stenosis as well as the lateral recess stenosis. She does have radiological and MRI evidence of lumbar facet arthropathy affecting multiple levels. The patient's hip pain is likely to be coming from a severe L3-4 lumbar spinal stenosis. This is probably contributing to the lower extremity pain as well.  However, her MRI also shows severe lumbar facet arthropathy with edema, which is likely to be contributing to the low back pain, hip pain, and some of the referred pain down the legs.  Improving this patient's condition will probably require multiple interventional therapies involving the intraspinal as well as the posterior elements of the spine.  The patient had some x-rays done at the [facility], unfortunately there are no official reports from any of them available.  In considering the treatment plan options, Tracy Mann was reminded that I no longer take patients for medication management only. I asked her to let me know if she had no intention of taking advantage of the interventional therapies, so that we could make arrangements to provide this space to someone interested. I also made it clear that undergoing interventional therapies for the purpose of getting pain medications is very inappropriate on the part of a patient, and it will not be tolerated in this practice. This type of behavior would suggest true addiction and therefore it requires referral to an addiction specialist.   Further details on both, my assessment(s), as well as the proposed treatment plan, please see below.  Controlled Substance Pharmacotherapy Assessment REMS (Risk Evaluation and Mitigation Strategy)  Analgesic: Hydrocodone/APAP 10/325, 1 tab p.o. 4 times daily (last filled on 11/12/2019) MME/day: 40 mg/day  Pill Count: None expected due to no prior prescriptions written by our practice. No notes on file Pharmacokinetics: Liberation and absorption (onset of action):  WNL Distribution (time to peak effect): WNL Metabolism and excretion (duration of action): WNL         Pharmacodynamics: Desired effects: Analgesia: Tracy Mann reports >50% benefit. Functional ability: Patient reports that medication allows her to accomplish basic ADLs Clinically meaningful improvement in function (CMIF): Sustained CMIF goals met Perceived effectiveness: Described as relatively effective, allowing for increase in activities of daily living (ADL) Undesirable effects: Side-effects or Adverse reactions: None reported Monitoring: [place] PMP: PDMP reviewed during this encounter. Online review of the past 52-monthperiod previously conducted. Not applicable at this point since we have not taken over the patient's medication management yet. List of other Serum/Urine Drug Screening Test(s):  No results found. List of all UDS test(s) done:  Lab Results  Component Value Date   SUMMARY Note 11/26/2019   Last UDS on record: Summary  Date Value Ref Range Status  11/26/2019 Note  Final    Comment:    ==================================================================== Compliance Drug Analysis, Ur ==================================================================== Test                             Result       Flag       Units  Drug Present and Declared for Prescription Verification   Lorazepam                      1673         EXPECTED   ng/mg creat    Source of lorazepam is a scheduled prescription medication.  Hydrocodone                    3151         EXPECTED   ng/mg creat   Hydromorphone                  273          EXPECTED   ng/mg creat   Dihydrocodeine                 917          EXPECTED   ng/mg creat   Norhydrocodone                 3806         EXPECTED   ng/mg creat    Sources of hydrocodone include scheduled prescription medications.    Hydromorphone, dihydrocodeine and norhydrocodone are expected    metabolites of hydrocodone. Hydromorphone and dihydrocodeine  are    also available as scheduled prescription medications.    Acetaminophen                  PRESENT      EXPECTED  Drug Absent but Declared for Prescription Verification   Buprenorphine                  Not Detected UNEXPECTED ng/mg creat    Low dose buprenorphine is not always detected even when used as    directed.    Gabapentin                     Not Detected UNEXPECTED    Topical gabapentin, as indicated in the declared medication list, is    not always detected even when used as directed.    Baclofen                       Not Detected UNEXPECTED   Diclofenac                     Not Detected UNEXPECTED    Topical diclofenac, as indicated in the declared medication list, is    not always detected even when used as directed.    Metoprolol                     Not Detected UNEXPECTED ==================================================================== Test                      Result    Flag   Units      Ref Range   Creatinine              78               mg/dL      >=20 ==================================================================== Declared Medications:  The flagging and interpretation on this report are based on the  following declared medications.  Unexpected results may arise from  inaccuracies in the declared medications.   **Note: The testing scope of this panel includes these medications:   Baclofen  Hydrocodone (Norco)  Lorazepam (Ativan)  Metoprolol (Lopressor)   **Note: The testing scope of this panel does not include small to  moderate amounts of these reported medications:   Acetaminophen (Norco)  Buprenorphine  Topical Diclofenac  Topical Gabapentin   **Note: The testing scope of this panel does not include the  following reported medications:   Estradiol (Estrace)  Ezetimibe (Zetia)  Flavoxate (Urispas)  Fluticasone (Flonase)  Insulin  Levofloxacin (Levaquin)  Levothyroxine (Synthroid)  Lisinopril (Zestril)  Magnesium  Metolazone  (Zaroxolyn)  Nitroglycerin (Nitrostat)  Phenazopyridine (Pyridium)  Potassium (Klor-Con)  Ropinirole (Requip)  Torsemide (Demadex)  Vitamin D2 (Drisdol)  Warfarin (Coumadin) ==================================================================== For clinical consultation, please call [phone]. ====================================================================    UDS interpretation: No unexpected findings.          Medication Assessment Form: Patient introduced to form today Treatment compliance: Treatment may start today if patient agrees with proposed plan. Evaluation of compliance is not applicable at this point Risk Assessment Profile: Aberrant behavior: See initial evaluations. None observed or detected today Comorbid factors increasing risk of overdose: See initial evaluation. No additional risks detected today Opioid risk tool (ORT):  No flowsheet data found.  ORT Scoring interpretation table:  Score <3 = Low Risk for SUD  Score between 4-7 = Moderate Risk for SUD  Score >8 = High Risk for Opioid Abuse   Risk of substance use disorder (SUD): Low  Risk Mitigation Strategies:  Patient opioid safety counseling: Completed today. Counseling provided to patient as per "Patient Counseling Document". Document signed by patient, attesting to counseling and understanding Patient-Prescriber Agreement (PPA): Obtained today.  Controlled substance notification to other providers: Written and sent today.  Pharmacologic Plan: Today we may be taking over the patient's pharmacological regimen. See below.             Laboratory Chemistry Profile   Renal Lab Results  Component Value Date   BUN 26 07/29/2019   CREATININE 0.79 07/29/2019   BCR 33 (H) 07/29/2019   GFRAA 79 07/29/2019   GFRNONAA 69 07/29/2019   SPECGRAV  10/31/2019     Comment:     n/a   PHUR 5.0 10/31/2019   PROTEINUR  10/31/2019     Comment:     n/a     Electrolytes Lab Results  Component Value Date   NA  142 07/29/2019   K 4.9 07/29/2019   CL 100 07/29/2019   CALCIUM 9.1 07/29/2019   MG 2.1 11/26/2019   PHOS 3.9 09/08/2014     Hepatic Lab Results  Component Value Date   AST 18 08/29/2018   ALT 13 08/29/2018   ALBUMIN 4.0 08/29/2018   ALKPHOS 99 08/29/2018     ID No results found for: LYMEIGGIGMAB, HIV, SARSCOV2NAA, STAPHAUREUS, MRSAPCR, HCVAB, PREGTESTUR, RMSFIGG, QFVRPH1IGG, QFVRPH2IGG, [facility]   Bone Lab Results  Component Value Date   25OHVITD1 43 11/26/2019   25OHVITD2 9.2 11/26/2019   25OHVITD3 34 11/26/2019     Endocrine Lab Results  Component Value Date   GLUCOSE 152 (H) 07/29/2019   GLUCOSEU Negative 02/24/2019   HGBA1C 6.8 (A) 09/22/2019   TSH 0.888 08/29/2018     Neuropathy Lab Results  Component Value Date   VITAMINB12 360 11/26/2019   HGBA1C 6.8 (A) 09/22/2019     CNS No results found for: COLORCSF, APPEARCSF, RBCCOUNTCSF, WBCCSF, POLYSCSF, LYMPHSCSF, EOSCSF, PROTEINCSF, GLUCCSF, JCVIRUS, CSFOLI, IGGCSF, LABACHR, ACETBL, LABACHR, ACETBL   Inflammation (CRP: Acute  ESR: Chronic) Lab Results  Component Value Date   CRP 4 11/26/2019   ESRSEDRATE 45 (H) 11/26/2019     Rheumatology No results found for: RF, ANA, LABURIC, URICUR, LYMEIGGIGMAB, LYMEABIGMQN, HLAB27   Coagulation Lab Results  Component Value Date   INR 2.4 12/03/2019   LABPROT 18.9 (H) 12/15/2016   PLT 225 08/29/2018     Cardiovascular Lab Results  Component Value Date   BNP 65.9 02/26/2018  CKTOTAL 87 01/17/2019   TROPONINI <0.03 05/21/2015   HGB 13.5 08/29/2018   HCT 41.5 08/29/2018     Screening No results found for: SARSCOV2NAA, COVIDSOURCE, STAPHAUREUS, MRSAPCR, HCVAB, HIV, PREGTESTUR   Cancer No results found for: CEA, CA125, LABCA2   Allergens No results found for: ALMOND, APPLE, ASPARAGUS, AVOCADO, BANANA, BARLEY, BASIL, BAYLEAF, GREENBEAN, LIMABEAN, WHITEBEAN, BEEFIGE, REDBEET, BLUEBERRY, BROCCOLI, CABBAGE, MELON, CARROT, CASEIN, CASHEWNUT, CAULIFLOWER,  CELERY     Note: Lab results reviewed.  Recent Diagnostic Imaging Review  Lumbosacral Imaging: Lumbar MR wo contrast: Results for orders placed during the hospital encounter of 08/20/19 MR LUMBAR SPINE WO CONTRAST  Narrative CLINICAL DATA:  Chronic pain in the low back, hips, and lower legs.  EXAM: MRI LUMBAR SPINE WITHOUT CONTRAST  TECHNIQUE: Multiplanar, multisequence MR imaging of the lumbar spine was performed. No intravenous contrast was administered.  COMPARISON:  03/05/2008  FINDINGS: Segmentation:  Standard.  Alignment:  Chronic trace anterolisthesis of L4 on L5.  Vertebrae: No fracture or suspicious osseous lesion. New right-sided facet edema at L3-4 with a small joint effusion, degenerative in appearance. Slight enlargement of benign hemangiomas in the L5 and S1 vertebral bodies.  Conus medullaris and cauda equina: Conus extends to the T12 level. Conus and cauda equina appear normal.  Paraspinal and other soft tissues: Slight enlargement of a few small T2 hyperintense lesions in the left kidney, likely cysts.  Disc levels:  Disc desiccation throughout the lumbar spine. Mild multilevel disc space narrowing, greatest at L4-5.  L1-2: Minimal disc bulging, stable to minimally increased. No stenosis.  L2-3: Mildly increased disc bulging and mild facet and ligamentum flavum hypertrophy without significant stenosis.  L3-4: Disc bulging, a new small central disc extrusion with slight superior migration, congenitally short pedicles, mild ligamentum flavum hypertrophy, and moderate to severe facet hypertrophy result in progressive severe spinal stenosis with potential bilateral L4 nerve root impingement in the lateral recesses. No neural foraminal stenosis.  L4-5: Anterolisthesis with mild bulging of uncovered disc and severe facet arthrosis result in mild spinal stenosis and moderate right lateral recess stenosis, mildly progressed. No neural  foraminal stenosis.  L5-S1: Progressive moderate right greater than left facet arthrosis without disc herniation or stenosis.  IMPRESSION: 1. Progressive lumbar disc and facet degeneration, most notable at L3-4 where there is now severe spinal stenosis. 2. Moderate right lateral recess stenosis at L4-5.   Electronically Signed By: Logan Bores M.D. On: 08/22/2019 08:50  Lumbar DG Bending views: Results for orders placed during the hospital encounter of 11/26/19 DG Lumbar Spine Complete W/Bend  Narrative CLINICAL DATA:  LOWER back pain. Increasing LOWER back pain. Symptoms for years, worse recently.  EXAM: LUMBAR SPINE - COMPLETE WITH BENDING VIEWS  COMPARISON:  MRI of the lumbar spine on 08/20/2019  FINDINGS: There is 4 millimeters of anterolisthesis of L4 on L5, stable during flexion and extension. There is no acute fracture or subluxation. Significant facet hypertrophy noted in the LOWER lumbar levels. There is disc height loss at L4-5. No lytic or blastic lesions. Visualized bowel gas pattern is nonobstructed.  IMPRESSION: 1. Stable anterolisthesis of L4 on L5 during flexion and extension. 2. Significant facet hypertrophy in the LOWER lumbar levels.   Electronically Signed By: Nolon Nations M.D. On: 11/28/2019 19:47        Complexity Note: Imaging results reviewed. Results shared with Tracy Mann, using Layman's terms.  Meds   Current Outpatient Medications:  .  ACCU-CHEK AVIVA PLUS test strip, USE WITH METER TO CHECK GLUCOSE BEFORE MEALS AND AT BEDTIME, Disp: 200 strip, Rfl: 12 .  estradiol (ESTRACE) 0.1 MG/GM vaginal cream, Place 1 Applicatorful vaginally at bedtime., Disp: 42.5 g, Rfl: 0 .  ezetimibe (ZETIA) 10 MG tablet, TAKE 1 TABLET BY MOUTH EVERY DAY, Disp: 90 tablet, Rfl: 3 .  flavoxATE (URISPAS) 100 MG tablet, Take 1 tablet (100 mg total) by mouth 2 (two) times daily as needed for bladder spasms., Disp: 60 tablet, Rfl: 11 .   fluticasone (FLONASE) 50 MCG/ACT nasal spray, SPRAY TWO SPRAYS IN EACH NOSTRIL ONCE DAILY, Disp: 48 mL, Rfl: 3 .  insulin glargine (LANTUS SOLOSTAR) 100 UNIT/ML Solostar Pen, Inject 20 units nightly between 8-10 PM. Adjust as directed. Max dose is 50 units daily., Disp: , Rfl:  .  insulin lispro (HUMALOG KWIKPEN) 100 UNIT/ML KwikPen, INJECT 10-20 UNITS THREE TIMES DAILY WITH EACH MEAL., Disp: 15 mL, Rfl: 12 .  Insulin Pen Needle (RELION PEN NEEDLE 31G/8MM) 31G X 8 MM MISC, Use with insulin pen three times daily, Disp: 100 each, Rfl: 12 .  levothyroxine (SYNTHROID) 125 MCG tablet, TAKE 1 TABLET (125 MCG TOTAL) BY MOUTH DAILY BEFORE BREAKFAST., Disp: 90 tablet, Rfl: 4 .  lisinopril (ZESTRIL) 2.5 MG tablet, TAKE 1 TABLET BY MOUTH EVERYDAY AT BEDTIME, Disp: 90 tablet, Rfl: 1 .  LORazepam (ATIVAN) 1 MG tablet, TAKE 1 TABLET (1 MG TOTAL) BY MOUTH 2 (TWO) TIMES DAILY AS NEEDED., Disp: 60 tablet, Rfl: 1 .  Magnesium 500 MG CAPS, Take 1,000 mg by mouth daily. , Disp: , Rfl:  .  metolazone (ZAROXOLYN) 2.5 MG tablet, Take 1 tablet (2.5 mg total) by mouth 2 (two) times a week. TAKE 30 minutes before Torsemide, Disp: 26 tablet, Rfl: 1 .  metoprolol tartrate (LOPRESSOR) 25 MG tablet, TAKE 1 TABLET BY MOUTH AS NEEDED PER DAY FOR HEART RATE PERSISTENTLY >100BPM, Disp: 30 tablet, Rfl: 4 .  nitroGLYCERIN (NITROSTAT) 0.4 MG SL tablet, Place 1 tablet (0.4 mg total) under the tongue every 5 (five) minutes as needed for chest pain., Disp: 25 tablet, Rfl: 3 .  NONFORMULARY OR COMPOUNDED ITEM, Diclofenac/Baclofen/Bu/Gabapentin Compounded Cream, Disp: , Rfl:  .  phenazopyridine (PYRIDIUM) 200 MG tablet, Take 1 tablet (200 mg total) by mouth 3 (three) times daily as needed for pain., Disp: 10 tablet, Rfl: 0 .  potassium chloride SA (KLOR-CON) 20 MEQ tablet, Take 1-2 tablets with metolazone, Disp: , Rfl:  .  torsemide (DEMADEX) 20 MG tablet, Take 1 tablet (20 mg total) by mouth daily., Disp: 90 tablet, Rfl: 1 .  Vitamin D,  Ergocalciferol, (DRISDOL) 50000 units CAPS capsule, Take 50,000 Units by mouth. Going to restart, Disp: , Rfl:  .  warfarin (COUMADIN) 4 MG tablet, TAKE 1 TABLET BY MOUTH EVERY DAY (Patient taking differently: Pt alter dose everyday. She takes coumadin 58m and 3 mg QOD), Disp: 90 tablet, Rfl: 4 .  HYDROcodone-acetaminophen (NORCO) 10-325 MG tablet, Take 1 tablet by mouth every 6 (six) hours as needed for severe pain. Must last 30 days., Disp: 120 tablet, Rfl: 0 .  levofloxacin (LEVAQUIN) 500 MG tablet, Take 1 tablet (500 mg total) by mouth daily. (Patient not taking: Reported on 12/08/2019), Disp: 5 tablet, Rfl: 0  ROS  Constitutional: Denies any fever or chills Gastrointestinal: No reported hemesis, hematochezia, vomiting, or acute GI distress Musculoskeletal: Denies any acute onset joint swelling, redness, loss of ROM, or weakness Neurological: No  reported episodes of acute onset apraxia, aphasia, dysarthria, agnosia, amnesia, paralysis, loss of coordination, or loss of consciousness  Allergies  Tracy Mann is allergic to duloxetine; other; paroxetine; sulfa antibiotics; cephalexin; clarithromycin; diphenhydramine; estrogens conjugated; estrogens, conjugated; sulfonamide derivatives; and nitrofurantoin.  [place]  Drug: Tracy Mann  reports no history of drug use. Alcohol:  reports no history of alcohol use. Tobacco:  reports that she has never smoked. She has never used smokeless tobacco. Medical:  has a past medical history of Acute myocardial infarction of inferior wall ([place]), Arthritis, Chronic anticoagulation, Chronic atrial fibrillation ([place]), Chronic combined systolic and diastolic CHF (congestive heart failure) ([place]), Essential hypertension, Hyperlipidemia, mixed, Mixed Ischemic/Nonischemic Cardiomyopathy, Nonobstructive coronary artery disease, Obesity, Thyroid disease, Type II diabetes mellitus ([place]), Venous insufficiency, and Vitamin D deficiency. Surgical: Tracy Mann  has a past surgical history  that includes Vesicovaginal fistula closure w/ TAH (1996); Tubal ligation (1968); Cholecystectomy (1999); and Cardiac catheterization (11/2012). Family: family history includes Heart disease in her mother; Thyroid disease in her father.  Constitutional Exam  General appearance: Well nourished, well developed, and well hydrated. In no apparent acute distress Vitals:   12/08/19 1304  BP: 129/72  Pulse: 84  Resp: 18  Temp: 98.4 F (36.9 C)  TempSrc: Oral  SpO2: 95%  Weight: 213 lb 6.4 oz (96.8 kg)  Height: 5' 5.5" (1.664 m)   BMI Assessment: Estimated body mass index is 34.97 kg/m as calculated from the following:   Height as of this encounter: 5' 5.5" (1.664 m).   Weight as of this encounter: 213 lb 6.4 oz (96.8 kg).  BMI interpretation table: BMI level Category Range association with higher incidence of chronic pain  <18 kg/m2 Underweight   18.5-24.9 kg/m2 Ideal body weight   25-29.9 kg/m2 Overweight Increased incidence by 20%  30-34.9 kg/m2 Obese (Class I) Increased incidence by 68%  35-39.9 kg/m2 Severe obesity (Class II) Increased incidence by 136%  >40 kg/m2 Extreme obesity (Class III) Increased incidence by 254%   Patient's current BMI Ideal Body weight  Body mass index is 34.97 kg/m. Ideal body weight: 58.2 kg (128 lb 3.2 oz) Adjusted ideal body weight: 73.6 kg (162 lb 4.5 oz)   BMI Readings from Last 4 Encounters:  12/08/19 34.97 kg/m  11/26/19 35.35 kg/m  11/13/19 35.89 kg/m  11/12/19 36.15 kg/m   Wt Readings from Last 4 Encounters:  12/08/19 213 lb 6.4 oz (96.8 kg)  11/26/19 219 lb (99.3 kg)  11/13/19 219 lb (99.3 kg)  11/12/19 220 lb 9.6 oz (100.1 kg)   Psych/Mental status: Alert, oriented x 3 (person, place, & time)       Eyes: PERLA Respiratory: No evidence of acute respiratory distress  Assessment & Plan  Primary Diagnosis & Pertinent Problem List: The primary encounter diagnosis was Chronic pain syndrome. Diagnoses of Chronic lower extremity pain  (Bilateral), Lumbar central spinal stenosis (SEVERE) (L3-4) with neurogenic claudication, Lumbar lateral recess stenosis (L4-5) (Right), Lumbosacral radiculopathy at L5 (Bilateral), Chronic hip pain (2ry area of Pain) (Bilateral) (R>L), Chronic low back pain (Bilateral) w/ sciatica (Bilateral), Osteoarthritis of lumbar spine, Osteoarthritis of facet joint of lumbar spine, Lumbar facet hypertrophy (Multilevel) (Bilateral), Lumbosacral facet syndrome, Osteoarthritis of knee (Right), Osteoarthritis involving multiple joints, Chronic atrial fibrillation (HCC), Chronic anticoagulation (Coumadin), and Pharmacologic therapy were also pertinent to this visit.  Visit Diagnosis: 1. Chronic pain syndrome   2. Chronic lower extremity pain (Bilateral)   3. Lumbar central spinal stenosis (SEVERE) (L3-4) with neurogenic claudication   4. Lumbar lateral  recess stenosis (L4-5) (Right)   5. Lumbosacral radiculopathy at L5 (Bilateral)   6. Chronic hip pain (2ry area of Pain) (Bilateral) (R>L)   7. Chronic low back pain (Bilateral) w/ sciatica (Bilateral)   8. Osteoarthritis of lumbar spine   9. Osteoarthritis of facet joint of lumbar spine   10. Lumbar facet hypertrophy (Multilevel) (Bilateral)   11. Lumbosacral facet syndrome   12. Osteoarthritis of knee (Right)   13. Osteoarthritis involving multiple joints   14. Chronic atrial fibrillation (HCC)   15. Chronic anticoagulation (Coumadin)   16. Pharmacologic therapy    Problems updated and reviewed during this visit: Problem  Chronic hip pain (2ry area of Pain) (Bilateral) (R>L)  Osteoarthritis involving multiple joints  Osteoarthritis of knee (Right)  Osteoarthritis of Facet Joint of Lumbar Spine  Osteoarthritis of lumbar spine  Lumbar lateral recess stenosis (L4-5) (Right)   Levels: L3-4: severe spinal stenosis with potential bilateral L4 nerve root impingement in the lateral recesses. L4-5: Anterolisthesis with moderate right lateral recess stenosis    Chronic lower extremity pain (1ry area of Pain) (Bilateral)  Lumbar facet hypertrophy (Multilevel) (Bilateral)   Alignment: anterolisthesis of L4 on L5. Vertebrae: New right-sided facet edema at L3-4 with a small joint effusion, degenerative in appearance.  Levels: L2-3: facet hypertrophy L3-4: severe facet hypertrophy L4-5: Anterolisthesis with severe facet arthrosis L5-S1: right greater than left facet arthrosis   Lumbar central spinal stenosis (SEVERE) (L3-4) with neurogenic claudication  Angina At Rest (Hcc)    Plan of Care  Pharmacotherapy (Medications Ordered): Meds ordered this encounter  Medications  . HYDROcodone-acetaminophen (NORCO) 10-325 MG tablet    Sig: Take 1 tablet by mouth every 6 (six) hours as needed for severe pain. Must last 30 days.    Dispense:  120 tablet    Refill:  0    Chronic Pain: STOP Act (Not applicable) Fill 1 day early if closed on refill date. Avoid benzodiazepines within 8 hours of opioids    Procedure Orders     Lumbar Epidural Injection     Lumbar Transforaminal Epidural Lab Orders  No laboratory test(s) ordered today   Imaging Orders  No imaging studies ordered today   Referral Orders  No referral(s) requested today    Pharmacological management options:  Opioid Analgesics: We'll take over management today. See above orders Membrane stabilizer: Options discussed, including a trial. Muscle relaxant: We have discussed the possibility of a trial NSAID: Trial discussed. Other analgesic(s): To be determined at a later time    Interventional management options: Planned, scheduled, and/or pending:    NOTE: COUMADIN Anticoagulation (Stop: 5 days  Restart: 2 hours) Diagnostic right L3-4 LESI #1  Diagnostic right L4 TFESI #1  The patient will also need to have x-rays of both of her hips and knees done as well as x-rays of her cervical and thoracic spine since there are no official reports on any of the x-rays done at the [facility].   Considering:   Diagnostic bilateral L4 TFESI x1 (Done 09/12/2019 - Dr. Alba Destine [[facility]]) Diagnostic midline caudal ESI #1 + diagnostic epidurogram  Diagnostic bilateral IA hip joint injection #1  Diagnostic right L3-4 LESI #1  Diagnostic right L4 TFESI #1  Diagnostic bilateral L3 transforaminal ESI #1  Diagnostic bilateral lumbar facet block #1    PRN Procedures:   None at this time    Provider-requested follow-up: Return in about 1 month (around 01/07/2020) for Procedure (w/ sedation): (R) L3-4 LESI #1 + (R) L4 TFESI #  1 , (Blood Thinner Protocol). Recent Visits Date Type Provider Dept  12/08/19 Office Visit Milinda Pointer, MD [facility]  11/26/19 Office Visit Milinda Pointer, MD [facility]  Showing recent visits within past 90 days and meeting all other requirements Future Appointments Date Type Provider Dept  01/05/20 Appointment Milinda Pointer, [facility]  01/06/20 Appointment Milinda Pointer, MD [facility]  Showing future appointments within next 90 days and meeting all other requirements  Primary Care Physician: Jerrol Banana., MD Note by: Gaspar Cola, MD Date: 12/08/2019; Time: 6:16 AM

## 2019-12-08 NOTE — Patient Instructions (Addendum)
____________________________________________________________________________________________  Preparing for Procedure with Sedation  Procedure appointments are limited to planned procedures: . No Prescription Refills. . No disability issues will be discussed. . No medication changes will be discussed.  Instructions: . Oral Intake: Do not eat or drink anything for at least 8 hours prior to your procedure. (Exception: Blood Pressure Medication. See below.) . Transportation: Unless otherwise stated by your physician, you may drive yourself after the procedure. . Blood Pressure Medicine: Do not forget to take your blood pressure medicine with a sip of water the morning of the procedure. If your Diastolic (lower reading)is above 100 mmHg, elective cases will be cancelled/rescheduled. . Blood thinners: These will need to be stopped for procedures. Notify our staff if you are taking any blood thinners. Depending on which one you take, there will be specific instructions on how and when to stop it. . Diabetics on insulin: Notify the staff so that you can be scheduled 1st case in the morning. If your diabetes requires high dose insulin, take only  of your normal insulin dose the morning of the procedure and notify the staff that you have done so. . Preventing infections: Shower with an antibacterial soap the morning of your procedure. . Build-up your immune system: Take 1000 mg of Vitamin C with every meal (3 times a day) the day prior to your procedure. . Antibiotics: Inform the staff if you have a condition or reason that requires you to take antibiotics before dental procedures. . Pregnancy: If you are pregnant, call and cancel the procedure. . Sickness: If you have a cold, fever, or any active infections, call and cancel the procedure. . Arrival: You must be in the facility at least 30 minutes prior to your scheduled procedure. . Children: Do not bring children with you. . Dress appropriately:  Bring dark clothing that you would not mind if they get stained. . Valuables: Do not bring any jewelry or valuables.  Reasons to call and reschedule or cancel your procedure: (Following these recommendations will minimize the risk of a serious complication.) . Surgeries: Avoid having procedures within 2 weeks of any surgery. (Avoid for 2 weeks before or after any surgery). . Flu Shots: Avoid having procedures within 2 weeks of a flu shots or . (Avoid for 2 weeks before or after immunizations). . Barium: Avoid having a procedure within 7-10 days after having had a radiological study involving the use of radiological contrast. (Myelograms, Barium swallow or enema study). . Heart attacks: Avoid any elective procedures or surgeries for the initial 6 months after a "Myocardial Infarction" (Heart Attack). . Blood thinners: It is imperative that you stop these medications before procedures. Let us know if you if you take any blood thinner.  . Infection: Avoid procedures during or within two weeks of an infection (including chest colds or gastrointestinal problems). Symptoms associated with infections include: Localized redness, fever, chills, night sweats or profuse sweating, burning sensation when voiding, cough, congestion, stuffiness, runny nose, sore throat, diarrhea, nausea, vomiting, cold or Flu symptoms, recent or current infections. It is specially important if the infection is over the area that we intend to treat. . Heart and lung problems: Symptoms that may suggest an active cardiopulmonary problem include: cough, chest pain, breathing difficulties or shortness of breath, dizziness, ankle swelling, uncontrolled high or unusually low blood pressure, and/or palpitations. If you are experiencing any of these symptoms, cancel your procedure and contact your primary care physician for an evaluation.  Remember:  Regular Business hours are:    Monday to Thursday 8:00 AM to 4:00 PM  Provider's  Schedule: Milinda Pointer, MD:  Procedure days: Tuesday and Thursday 7:30 AM to 4:00 PM  Gillis Santa, MD:  Procedure days: Monday and Wednesday 7:30 AM to 4:00 PM ____________________________________________________________________________________________   ____________________________________________________________________________________________  Medication Rules  Purpose: To inform patients, and their family members, of our rules and regulations.  Applies to: All patients receiving prescriptions (written or electronic).  Pharmacy of record: Pharmacy where electronic prescriptions will be sent. If written prescriptions are taken to a different pharmacy, please inform the nursing staff. The pharmacy listed in the electronic medical record should be the one where you would like electronic prescriptions to be sent.  Electronic prescriptions: In compliance with the [place] [address] [place] (STOP) Act of 2017 (Session Lanny Cramp [phone]), effective January 30, 2018, all controlled substances must be electronically prescribed. Calling prescriptions to the pharmacy will cease to exist.  Prescription refills: Only during scheduled appointments. Applies to all prescriptions.  NOTE: The following applies primarily to controlled substances (Opioid* Pain Medications).   Type of encounter (visit): For patients receiving controlled substances, face-to-face visits are required. (Not an option or up to the patient.)  Patient's responsibilities: 1. Pain Pills: Bring all pain pills to every appointment (except for procedure appointments). 2. Pill Bottles: Bring pills in original pharmacy bottle. Always bring the newest bottle. Bring bottle, even if empty. 3. Medication refills: You are responsible for knowing and keeping track of what medications you take and those you need refilled. The day before your appointment: write a list of all prescriptions that need to be  refilled. The day of the appointment: give the list to the admitting nurse. Prescriptions will be written only during appointments. No prescriptions will be written on procedure days. If you forget a medication: it will not be "Called in", "Faxed", or "electronically sent". You will need to get another appointment to get these prescribed. No early refills. Do not call asking to have your prescription filled early. 4. Prescription Accuracy: You are responsible for carefully inspecting your prescriptions before leaving our office. Have the discharge nurse carefully go over each prescription with you, before taking them home. Make sure that your name is accurately spelled, that your address is correct. Check the name and dose of your medication to make sure it is accurate. Check the number of pills, and the written instructions to make sure they are clear and accurate. Make sure that you are given enough medication to last until your next medication refill appointment. 5. Taking Medication: Take medication as prescribed. When it comes to controlled substances, taking less pills or less frequently than prescribed is permitted and encouraged. Never take more pills than instructed. Never take medication more frequently than prescribed.  6. Inform other Doctors: Always inform, all of your healthcare providers, of all the medications you take. 7. Pain Medication from other Providers: You are not allowed to accept any additional pain medication from any other Doctor or Healthcare provider. There are two exceptions to this rule. (see below) In the event that you require additional pain medication, you are responsible for notifying us, as stated below. 8. Medication Agreement: You are responsible for carefully reading and following our Medication Agreement. This must be signed before receiving any prescriptions from our practice. Safely store a copy of your signed Agreement. Violations to the Agreement will result in  no further prescriptions. (Additional copies of our Medication Agreement are available upon request.) 9. Laws, Rules, & Regulations: All  patients are expected to follow all Federal and [employer], [employer], Rules, [employer] [facility]. Ignorance of the Laws does not constitute a valid excuse.  10. Illegal drugs and Controlled Substances: The use of illegal substances (including, but not limited to marijuana and its derivatives) and/or the illegal use of any controlled substances is strictly prohibited. Violation of this rule may result in the immediate and permanent discontinuation of any and all prescriptions being written by our practice. The use of any illegal substances is prohibited. 11. Adopted CDC guidelines & recommendations: Target dosing levels will be at or below 60 MME/day. Use of benzodiazepines** is not recommended.  Exceptions: There are only two exceptions to the rule of not receiving pain medications from other Healthcare Providers. 1. Exception #1 (Emergencies): In the event of an emergency (i.e.: accident requiring emergency care), you are allowed to receive additional pain medication. However, you are responsible for: As soon as you are able, call our office [phone], at any time of the day or night, and leave a message stating your name, the date and nature of the emergency, and the name and dose of the medication prescribed. In the event that your call is answered by a member of our staff, make sure to document and save the date, time, and the name of the person that took your information.  2. Exception #2 (Planned Surgery): In the event that you are scheduled by another doctor or dentist to have any type of surgery or procedure, you are allowed (for a period no longer than 30 days), to receive additional pain medication, for the acute post-op pain. However, in this case, you are responsible for picking up a copy of our "Post-op Pain Management for Surgeons" handout, and giving it to your  surgeon or dentist. This document is available at our office, and does not require an appointment to obtain it. Simply go to our office during business hours (Monday-Thursday from 8:00 AM to 4:00 PM) (Friday 8:00 AM to 12:00 Noon) or if you have a scheduled appointment with [place], prior to your surgery, and ask for it by name. In addition, you are responsible for: calling our office [phone], at any time of the day or night, and leaving a message stating your name, name of your surgeon, type of surgery, and date of procedure or surgery. Failure to comply with your responsibilities may result in termination of therapy involving the controlled substances.  *Opioid medications include: morphine, codeine, oxycodone, oxymorphone, hydrocodone, hydromorphone, meperidine, tramadol, tapentadol, buprenorphine, fentanyl, methadone. **Benzodiazepine medications include: diazepam (Valium), alprazolam (Xanax), clonazepam (Klonopine), lorazepam (Ativan), clorazepate (Tranxene), chlordiazepoxide (Librium), estazolam (Prosom), oxazepam (Serax), temazepam (Restoril), triazolam (Halcion) (Last updated: 10/07/2019) ____________________________________________________________________________________________   ____________________________________________________________________________________________  Medication Recommendations and Reminders  Applies to: All patients receiving prescriptions (written and/or electronic).  Medication Rules & Regulations: These rules and regulations exist for your safety and that of others. They are not flexible and neither are we. Dismissing or ignoring them will be considered "non-compliance" with medication therapy, resulting in complete and irreversible termination of such therapy. (See document titled "Medication Rules" for more details.) In all conscience, because of safety reasons, we cannot continue providing a therapy where the patient does not follow instructions.  Pharmacy of  record:   Definition: This is the pharmacy where your electronic prescriptions will be sent.   We do not endorse any particular pharmacy, however, we have experienced problems with [facility] not securing enough medication supply for the community.  We do not restrict you  in your choice of pharmacy. However, once we write for your prescriptions, we will NOT be re-sending more prescriptions to fix restricted supply problems created by your pharmacy, or your insurance.   The pharmacy listed in the electronic medical record should be the one where you want electronic prescriptions to be sent.  If you choose to change pharmacy, simply notify our nursing staff.  Recommendations:  Keep all of your pain medications in a safe place, under lock and key, even if you live alone. We will NOT replace lost, stolen, or damaged medication.  After you fill your prescription, take 1 week's worth of pills and put them away in a safe place. You should keep a separate, properly labeled bottle for this purpose. The remainder should be kept in the original bottle. Use this as your primary supply, until it runs out. Once it's gone, then you know that you have 1 week's worth of medicine, and it is time to come in for a prescription refill. If you do this correctly, it is unlikely that you will ever run out of medicine.  To make sure that the above recommendation works, it is very important that you make sure your medication refill appointments are scheduled at least 1 week before you run out of medicine. To do this in an effective manner, make sure that you do not leave the office without scheduling your next medication management appointment. Always ask the nursing staff to show you in your prescription , when your medication will be running out. Then arrange for the receptionist to get you a return appointment, at least 7 days before you run out of medicine. Do not wait until you have 1 or 2 pills left, to come in. This is  very poor planning and does not take into consideration that we may need to cancel appointments due to bad weather, sickness, or emergencies affecting our staff.  DO NOT ACCEPT A "Partial Fill": If for any reason your pharmacy does not have enough pills/tablets to completely fill or refill your prescription, do not allow for a "partial fill". The law allows the pharmacy to complete that prescription within 72 hours, without requiring a new prescription. If they do not fill the rest of your prescription within those 72 hours, you will need a separate prescription to fill the remaining amount, which we will NOT provide. If the reason for the partial fill is your insurance, you will need to talk to the pharmacist about payment alternatives for the remaining tablets, but again, DO NOT ACCEPT A PARTIAL FILL, unless you can trust your pharmacist to obtain the remainder of the pills within 72 hours.  Prescription refills and/or changes in medication(s):   Prescription refills, and/or changes in dose or medication, will be conducted only during scheduled medication management appointments. (Applies to both, written and electronic prescriptions.)  No refills on procedure days. No medication will be changed or started on procedure days. No changes, adjustments, and/or refills will be conducted on a procedure day. Doing so will interfere with the diagnostic portion of the procedure.  No phone refills. No medications will be "called into the pharmacy".  No Fax refills.  No weekend refills.  No Holliday refills.  No after hours refills.  Remember:  Business hours are:  Monday to Thursday 8:00 AM to 4:00 PM Provider's Schedule: Milinda Pointer, MD - Appointments are:  Medication management: Monday and Wednesday 8:00 AM to 4:00 PM Procedure day: Tuesday and Thursday 7:30 AM to 4:00 PM Bilal  Holley Raring, MD - Appointments are:  Medication management: Tuesday and Thursday 8:00 AM to 4:00 PM Procedure day:  Monday and Wednesday 7:30 AM to 4:00 PM (Last update: 08/20/2019) ____________________________________________________________________________________________   ____________________________________________________________________________________________  CBD (cannabidiol) WARNING  Applicable to: All individuals currently taking or considering taking CBD (cannabidiol) and, more important, all patients taking opioid analgesic controlled substances (pain medication). (Example: oxycodone; oxymorphone; hydrocodone; hydromorphone; morphine; methadone; tramadol; tapentadol; fentanyl; buprenorphine; butorphanol; dextromethorphan; meperidine; codeine; etc.)  Legal status: CBD remains a Schedule I drug prohibited for any use. CBD is illegal with one exception. In the [place], CBD has a limited Transport planner (FDA) approval for the treatment of two specific types of epilepsy disorders. Only one CBD product has been approved by the FDA for this purpose: "Epidiolex". FDA is aware that some companies are marketing products containing cannabis and cannabis-derived compounds in ways that violate the [employer], Drug and Cosmetic Act [facility] Act) and that may put the health and safety of consumers at risk. The FDA, a Federal agency, has not enforced the CBD status since 2018.   Legality: Some manufacturers ship CBD products nationally, which is illegal. Often such products are sold online and are therefore available throughout the country. CBD is openly sold in head shops and health food stores in some states where such sales have not been explicitly legalized. Selling unapproved products with unsubstantiated therapeutic claims is not only a violation of the law, but also can put patients at risk, as these products have not been proven to be safe or effective. Federal illegality makes it difficult to conduct research on CBD.  Reference: "FDA Regulation of Cannabis and Cannabis-Derived Products,  Including Cannabidiol (CBD)" - [employer]  Warning: CBD is not FDA approved and has not undergo the same manufacturing controls as prescription drugs.  This means that the purity and safety of available CBD may be questionable. Most of the time, despite manufacturer's claims, it is contaminated with THC (delta-9-tetrahydrocannabinol - the chemical in marijuana responsible for the "HIGH").  When this is the case, the [facility] contaminant will trigger a positive urine drug screen (UDS) test for Marijuana (carboxy-THC). Because a positive UDS for any illicit substance is a violation of our medication agreement, your opioid analgesics (pain medicine) may be permanently discontinued.  MORE ABOUT CBD  General Information: CBD  is a derivative of the Marijuana (cannabis sativa) plant discovered in 62. It is one of the 113 identified substances found in Marijuana. It accounts for up to 40% of the plant's extract. As of 2018, preliminary clinical studies on CBD included research for the treatment of anxiety, movement disorders, and pain. CBD is available and consumed in multiple forms, including inhalation of smoke or vapor, as an aerosol spray, and by mouth. It may be supplied as an oil containing CBD, capsules, dried cannabis, or as a liquid solution. CBD is thought not to be as psychoactive as THC (delta-9-tetrahydrocannabinol - the chemical in marijuana responsible for the "HIGH"). Studies suggest that CBD may interact with different biological target receptors in the body, including cannabinoid and other neurotransmitter receptors. As of 2018 the mechanism of action for its biological effects has not been determined.  Side-effects  Adverse reactions: Dry mouth, diarrhea, decreased appetite, fatigue, drowsiness, malaise, weakness, sleep disturbances, and others.  Drug interactions: CBC may  interact with other medications such as blood-thinners. (Last update: 09/06/2019) ____________________________________________________________________________________________   ____________________________________________________________________________________________  Drug Holidays (Slow)  What is a "Drug Holiday"? Drug Holiday: is the name given  to the period of time during which a patient stops taking a medication(s) for the purpose of eliminating tolerance to the drug.  Benefits . Improved effectiveness of opioids. . Decreased opioid dose needed to achieve benefits. . Improved pain with lesser dose.  What is tolerance? Tolerance: is the progressive decreased in effectiveness of a drug due to its repetitive use. With repetitive use, the body gets use to the medication and as a consequence, it loses its effectiveness. This is a common problem seen with opioid pain medications. As a result, a larger dose of the drug is needed to achieve the same effect that used to be obtained with a smaller dose.  How long should a "Drug Holiday" last? You should stay off of the pain medicine for at least 14 consecutive days. (2 weeks)  Should I stop the medicine "cold [place]"? No. You should always coordinate with your Pain Specialist so that he/she can provide you with the correct medication dose to make the transition as smoothly as possible.  How do I stop the medicine? Slowly. You will be instructed to decrease the daily amount of pills that you take by one (1) pill every seven (7) days. This is called a "slow downward taper" of your dose. For example: if you normally take four (4) pills per day, you will be asked to drop this dose to three (3) pills per day for seven (7) days, then to two (2) pills per day for seven (7) days, then to one (1) per day for seven (7) days, and at the end of those last seven (7) days, this is when the "Drug Holiday" would start.   Will I have withdrawals? By doing a  "slow downward taper" like this one, it is unlikely that you will experience any significant withdrawal symptoms. Typically, what triggers withdrawals is the sudden stop of a high dose opioid therapy. Withdrawals can usually be avoided by slowly decreasing the dose over a prolonged period of time. If you do not follow these instructions and decide to stop your medication abruptly, withdrawals may be possible.  What are withdrawals? Withdrawals: refers to the wide range of symptoms that occur after stopping or dramatically reducing opiate drugs after heavy and prolonged use. Withdrawal symptoms do not occur to patients that use low dose opioids, or those who take the medication sporadically. Contrary to benzodiazepine (example: Valium, Xanax, etc.) or alcohol withdrawals ("Delirium Tremens"), opioid withdrawals are not lethal. Withdrawals are the physical manifestation of the body getting rid of the excess receptors.  Expected Symptoms Early symptoms of withdrawal may include: . Agitation . Anxiety . Muscle aches . Increased tearing . Insomnia . Runny nose . Sweating . Yawning  Late symptoms of withdrawal may include: . Abdominal cramping . Diarrhea . Dilated pupils . Goose bumps . Nausea . Vomiting  Will I experience withdrawals? Due to the slow nature of the taper, it is very unlikely that you will experience any.  What is a slow taper? Taper: refers to the gradual decrease in dose.  (Last update: 08/20/2019) ____________________________________________________________________________________________    ____________________________________________________________________________________________  Blood Thinners  IMPORTANT NOTICE:  If you take any of these, make sure to notify the nursing staff.  Failure to do so may result in injury.  Recommended time intervals to stop and restart blood-thinners, before & after invasive procedures  Generic Name Brand Name Stop Time. Must be  stopped at least this long before procedures. After procedures, wait at least this long before re-starting.  Abciximab  Reopro 15 days 2 hrs  Alteplase Activase 10 days 10 days  Anagrelide Agrylin    Apixaban Eliquis 3 days 6 hrs  Cilostazol Pletal 3 days 5 hrs  Clopidogrel Plavix 7-10 days 2 hrs  Dabigatran Pradaxa 5 days 6 hrs  Dalteparin Fragmin 24 hours 4 hrs  Dipyridamole Aggrenox 11days 2 hrs  Edoxaban Lixiana; Savaysa 3 days 2 hrs  Enoxaparin  Lovenox 24 hours 4 hrs  Eptifibatide Integrillin 8 hours 2 hrs  Fondaparinux  Arixtra 72 hours 12 hrs  Prasugrel Effient 7-10 days 6 hrs  Reteplase Retavase 10 days 10 days  Rivaroxaban Xarelto 3 days 6 hrs  Ticagrelor Brilinta 5-7 days 6 hrs  Ticlopidine Ticlid 10-14 days 2 hrs  Tinzaparin Innohep 24 hours 4 hrs  Tirofiban Aggrastat 8 hours 2 hrs  Warfarin Coumadin 5 days 2 hrs   Other medications with blood-thinning effects  Product indications Generic (Brand) names Note  Cholesterol Lipitor Stop 4 days before procedure  Blood thinner (injectable) Heparin (LMW or LMWH Heparin) Stop 24 hours before procedure  Cancer Ibrutinib (Imbruvica) Stop 7 days before procedure  Malaria/Rheumatoid Hydroxychloroquine (Plaquenil) Stop 11 days before procedure  Thrombolytics  10 days before or after procedures   Over-the-counter (OTC) Products with blood-thinning effects  Product Common names Stop Time  Aspirin > 325 mg Goody Powders, Excedrin, etc. 11 days  Aspirin ? 81 mg  7 days  Fish oil  4 days  Garlic supplements  7 days  Ginkgo biloba  36 hours  Ginseng  24 hours  NSAIDs Ibuprofen, Naprosyn, etc. 3 days  Vitamin E  4 days   ____________________________________________________________________________________________  Pain Management Discharge Instructions  General Discharge Instructions :  If you need to reach your doctor call: Monday-Friday 8:00 am - 4:00 pm at [phone] or toll free [phone].  After clinic hours  [phone] to have operator reach doctor.  Bring all of your medication bottles to all your appointments in the pain clinic.  To cancel or reschedule your appointment with Pain Management please remember to call 24 hours in advance to avoid a fee.  Refer to the educational materials which you have been given on: General Risks, I had my Procedure. Discharge Instructions, Post Sedation.  Post Procedure Instructions:  The drugs you were given will stay in your system until tomorrow, so for the next 24 hours you should not drive, make any legal decisions or drink any alcoholic beverages.  You may eat anything you prefer, but it is better to start with liquids then soups and crackers, and gradually work up to solid foods.  Please notify your doctor immediately if you have any unusual bleeding, trouble breathing or pain that is not related to your normal pain.  Depending on the type of procedure that was done, some parts of your body may feel week and/or numb.  This usually clears up by tonight or the next day.  Walk with the use of an assistive device or accompanied by an adult for the 24 hours.  You may use ice on the affected area for the first 24 hours.  Put ice in a Ziploc bag and cover with a towel and place against area 15 minutes on 15 minutes off.  You may switch to heat after 24 hours.GENERAL RISKS AND COMPLICATIONS  What are the risk, side effects and possible complications? Generally speaking, most procedures are safe.  However, with any procedure there are risks, side effects, and the possibility of complications.  The risks and complications  are dependent upon the sites that are lesioned, or the type of nerve block to be performed.  The closer the procedure is to the spine, the more serious the risks are.  Great care is taken when placing the radio frequency needles, block needles or lesioning probes, but sometimes complications can occur. 1. Infection: Any time there is an  injection through the skin, there is a risk of infection.  This is why sterile conditions are used for these blocks.  There are four possible types of infection. 1. Localized skin infection. 2. Central Nervous System Infection-This can be in the form of Meningitis, which can be deadly. 3. Epidural Infections-This can be in the form of an epidural abscess, which can cause pressure inside of the spine, causing compression of the spinal cord with subsequent paralysis. This would require an emergency surgery to decompress, and there are no guarantees that the patient would recover from the paralysis. 4. Discitis-This is an infection of the intervertebral discs.  It occurs in about 1% of discography procedures.  It is difficult to treat and it may lead to surgery.        2. Pain: the needles have to go through skin and soft tissues, will cause soreness.       3. Damage to internal structures:  The nerves to be lesioned may be near blood vessels or    other nerves which can be potentially damaged.       4. Bleeding: Bleeding is more common if the patient is taking blood thinners such as  aspirin, Coumadin, Ticiid, Plavix, etc., or if he/she have some genetic predisposition  such as hemophilia. Bleeding into the spinal canal can cause compression of the spinal  cord with subsequent paralysis.  This would require an emergency surgery to  decompress and there are no guarantees that the patient would recover from the  paralysis.       5. Pneumothorax:  Puncturing of a lung is a possibility, every time a needle is introduced in  the area of the chest or upper back.  Pneumothorax refers to free air around the  collapsed lung(s), inside of the thoracic cavity (chest cavity).  Another two possible  complications related to a similar event would include: Hemothorax and Chylothorax.   These are variations of the Pneumothorax, where instead of air around the collapsed  lung(s), you may have blood or chyle, respectively.        6. Spinal headaches: They may occur with any procedures in the area of the spine.       7. Persistent CSF (Cerebro-Spinal Fluid) leakage: This is a rare problem, but may occur  with prolonged intrathecal or epidural catheters either due to the formation of a fistulous  track or a dural tear.       8. Nerve damage: By working so close to the spinal cord, there is always a possibility of  nerve damage, which could be as serious as a permanent spinal cord injury with  paralysis.       9. Death:  Although rare, severe deadly allergic reactions known as "Anaphylactic  reaction" can occur to any of the medications used.      10. Worsening of the symptoms:  We can always make thing worse.  What are the chances of something like this happening? Chances of any of this occuring are extremely low.  By statistics, you have more of a chance of getting killed in a motor vehicle accident: while driving to  the hospital than any of the above occurring .  Nevertheless, you should be aware that they are possibilities.  In general, it is similar to taking a shower.  Everybody knows that you can slip, hit your head and get killed.  Does that mean that you should not shower again?  Nevertheless always keep in mind that statistics do not mean anything if you happen to be on the wrong side of them.  Even if a procedure has a 1 (one) in a 1,000,000 (million) chance of going wrong, it you happen to be that one..Also, keep in mind that by statistics, you have more of a chance of having something go wrong when taking medications.  Who should not have this procedure? If you are on a blood thinning medication (e.g. Coumadin, Plavix, see list of "Blood Thinners"), or if you have an active infection going on, you should not have the procedure.  If you are taking any blood thinners, please inform your physician.  How should I prepare for this procedure?  Do not eat or drink anything at least six hours prior to the  procedure.  Bring a driver with you .  It cannot be a taxi.  Come accompanied by an adult that can drive you back, and that is strong enough to help you if your legs get weak or numb from the local anesthetic.  Take all of your medicines the morning of the procedure with just enough water to swallow them.  If you have diabetes, make sure that you are scheduled to have your procedure done first thing in the morning, whenever possible.  If you have diabetes, take only half of your insulin dose and notify our nurse that you have done so as soon as you arrive at the clinic.  If you are diabetic, but only take blood sugar pills (oral hypoglycemic), then do not take them on the morning of your procedure.  You may take them after you have had the procedure.  Do not take aspirin or any aspirin-containing medications, at least eleven (11) days prior to the procedure.  They may prolong bleeding.  Wear loose fitting clothing that may be easy to take off and that you would not mind if it got stained with Betadine or blood.  Do not wear any jewelry or perfume  Remove any nail coloring.  It will interfere with some of our monitoring equipment.  NOTE: Remember that this is not meant to be interpreted as a complete list of all possible complications.  Unforeseen problems may occur.  BLOOD THINNERS The following drugs contain aspirin or other products, which can cause increased bleeding during surgery and should not be taken for 2 weeks prior to and 1 week after surgery.  If you should need take something for relief of minor pain, you may take acetaminophen which is found in Tylenol,m Datril, Anacin-3 and Panadol. It is not blood thinner. The products listed below are.  Do not take any of the products listed below in addition to any listed on your instruction sheet.  A.P.C or A.P.C with Codeine Codeine Phosphate Capsules #3 Ibuprofen Ridaura  ABC compound Congesprin Imuran rimadil  Advil Cope Indocin  Robaxisal  Alka-Seltzer Effervescent Pain Reliever and Antacid Coricidin or Coricidin-D  Indomethacin Rufen  Alka-Seltzer plus Cold Medicine Cosprin Ketoprofen S-A-C Tablets  Anacin Analgesic Tablets or Capsules Coumadin Korlgesic Salflex  Anacin Extra Strength Analgesic tablets or capsules CP-2 Tablets Lanoril Salicylate  Anaprox Cuprimine Capsules Levenox Salocol  Anexsia-D Dalteparin  Magan Salsalate  Anodynos Darvon compound Magnesium Salicylate Sine-off  Ansaid Dasin Capsules Magsal Sodium Salicylate  Anturane Depen Capsules Marnal Soma  APF Arthritis pain formula Dewitt's Pills Measurin Stanback  Argesic Dia-Gesic Meclofenamic Sulfinpyrazone  Arthritis Bayer Timed Release Aspirin Diclofenac Meclomen Sulindac  Arthritis pain formula Anacin Dicumarol Medipren Supac  Analgesic (Safety coated) Arthralgen Diffunasal Mefanamic Suprofen  Arthritis Strength Bufferin Dihydrocodeine Mepro Compound Suprol  Arthropan liquid Dopirydamole Methcarbomol with Aspirin Synalgos  ASA tablets/Enseals Disalcid Micrainin Tagament  Ascriptin Doan's Midol Talwin  Ascriptin A/D Dolene Mobidin Tanderil  Ascriptin Extra Strength Dolobid Moblgesic Ticlid  Ascriptin with Codeine Doloprin or Doloprin with Codeine Momentum Tolectin  Asperbuf Duoprin Mono-gesic Trendar  Aspergum Duradyne Motrin or Motrin IB Triminicin  Aspirin plain, buffered or enteric coated Durasal Myochrisine Trigesic  Aspirin Suppositories Easprin Nalfon Trillsate  Aspirin with Codeine Ecotrin Regular or Extra Strength Naprosyn Uracel  Atromid-S Efficin Naproxen Ursinus  Auranofin Capsules Elmiron Neocylate Vanquish  Axotal Emagrin Norgesic Verin  Azathioprine Empirin or Empirin with Codeine Normiflo Vitamin E  Azolid Emprazil Nuprin Voltaren  Bayer Aspirin plain, buffered or children's or timed BC Tablets or powders Encaprin Orgaran Warfarin Sodium  Buff-a-Comp Enoxaparin Orudis Zorpin  Buff-a-Comp with Codeine Equegesic Os-Cal-Gesic    Buffaprin Excedrin plain, buffered or Extra Strength Oxalid   Bufferin Arthritis Strength Feldene Oxphenbutazone   Bufferin plain or Extra Strength Feldene Capsules Oxycodone with Aspirin   Bufferin with Codeine Fenoprofen Fenoprofen Pabalate or Pabalate-SF   Buffets II Flogesic Panagesic   Buffinol plain or Extra Strength Florinal or Florinal with Codeine Panwarfarin   Buf-Tabs Flurbiprofen Penicillamine   Butalbital Compound Four-way cold tablets Penicillin   Butazolidin Fragmin Pepto-Bismol   Carbenicillin Geminisyn Percodan   Carna Arthritis Reliever Geopen Persantine   Carprofen Gold's salt Persistin   Chloramphenicol Goody's Phenylbutazone   Chloromycetin Haltrain Piroxlcam   Clmetidine heparin Plaquenil   Cllnoril Hyco-pap Ponstel   Clofibrate Hydroxy chloroquine Propoxyphen         Before stopping any of these medications, be sure to consult the physician who ordered them.  Some, such as Coumadin (Warfarin) are ordered to prevent or treat serious conditions such as "deep thrombosis", "pumonary embolisms", and other heart problems.  The amount of time that you may need off of the medication may also vary with the medication and the reason for which you were taking it.  If you are taking any of these medications, please make sure you notify your pain physician before you undergo any procedures.          Facet Joint Block The facet joints connect the bones of the spine (vertebrae). They make it possible for you to bend, twist, and make other movements with your spine. They also keep you from bending too far, twisting too far, and making other extreme movements. A facet joint block is a procedure in which a numbing medicine (anesthetic) is injected into a facet joint. In many cases, an anti-inflammatory medicine (steroid) is also injected. A facet joint block may be done:  To diagnose neck or back pain. If the pain gets better after a facet joint block, it means the pain is  probably coming from the facet joint. If the pain does not get better, it means the pain is probably not coming from the facet joint.  To relieve neck or back pain that is caused by an inflamed facet joint. A facet joint block is only done to relieve pain if the pain does not improve with other  methods, such as medicine, exercise programs, and physical therapy. Tell a health care provider about:  Any allergies you have.  All medicines you are taking, including vitamins, herbs, eye drops, creams, and over-the-counter medicines.  Any problems you or family members have had with anesthetic medicines.  Any blood disorders you have.  Any surgeries you have had.  Any medical conditions you have or have had.  Whether you are pregnant or may be pregnant. What are the risks? Generally, this is a safe procedure. However, problems may occur, including:  Bleeding.  Injury to a nerve near the injection site.  Pain at the injection site.  Weakness or numbness in areas controlled by nerves near the injection site.  Infection.  Temporary fluid retention.  Allergic reactions to medicines or dyes.  Injury to other structures or organs near the injection site. What happens before the procedure? Medicines Ask your health care provider about:  Changing or stopping your regular medicines. This is especially important if you are taking diabetes medicines or blood thinners.  Taking medicines such as aspirin and ibuprofen. These medicines can thin your blood. Do not take these medicines unless your health care provider tells you to take them.  Taking over-the-counter medicines, vitamins, herbs, and supplements. Eating and drinking Follow instructions from your health care provider about eating and drinking, which may include:  8 hours before the procedure - stop eating heavy meals or foods, such as meat, fried foods, or fatty foods.  6 hours before the procedure - stop eating light meals or  foods, such as toast or cereal.  6 hours before the procedure - stop drinking milk or drinks that contain milk.  2 hours before the procedure - stop drinking clear liquids. Staying hydrated Follow instructions from your health care provider about hydration, which may include:  Up to 2 hours before the procedure - you may continue to drink clear liquids, such as water, clear fruit juice, black coffee, and plain tea. General instructions  Do not use any products that contain nicotine or tobacco for at least 4-6 weeks before the procedure. These products include cigarettes, e-cigarettes, and chewing tobacco. If you need help quitting, ask your health care provider.  Plan to have someone take you home from the hospital or clinic.  Ask your health care provider: ? How your surgery site will be marked. ? What steps will be taken to help prevent infection. These may include:  Removing hair at the surgery site.  Washing skin with a germ-killing soap.  Receiving antibiotic medicine. What happens during the procedure?   You will put on a hospital gown.  You will lie on your stomach on an X-ray table. You may be asked to lie in a different position if an injection will be made in your neck.  Machines will be used to monitor your oxygen levels, heart rate, and blood pressure.  Your skin will be cleaned.  If an injection will be made in your neck, an IV will be inserted into one of your veins. Fluids and medicine will flow directly into your body through the IV.  A numbing medicine (local anesthetic) will be applied to your skin. Your skin may sting or burn for a moment.  A video X-ray machine (fluoroscopy) will be used to find the joint. In some cases, a CT scan may be used.  A contrast dye may be injected into the facet joint area to help find the joint.  When the joint is located, an  anesthetic will be injected into the joint through the needle.  Your health care provider will ask  you whether you feel pain relief. ? If you feel relief, a steroid may be injected to provide pain relief for a longer period of time. ? If you do not feel relief or feel only partial relief, additional injections of an anesthetic may be made in other facet joints.  The needle will be removed.  Your skin will be cleaned.  A bandage (dressing) will be applied over each injection site. The procedure may vary among health care providers and hospitals. What happens after the procedure?  Your blood pressure, heart rate, breathing rate, and blood oxygen level will be monitored until you leave the hospital or clinic.  You will lie down and rest for a period of time. Summary  A facet joint block is a procedure in which a numbing medicine (anesthetic) is injected into a facet joint. An anti-inflammatory medicine (stereoid) may also be injected.  Follow instructions from your health care provider about medicines and eating and drinking before the procedure.  Do not use any products that contain nicotine or tobacco for at least 4-6 weeks before the procedure.  You will lie on your stomach for the procedure, but you may be asked to lie in a different position if an injection will be made in your neck.  When the joint is located, an anesthetic will be injected into the joint through the needle. This information is not intended to replace advice given to you by your health care provider. Make sure you discuss any questions you have with your health care provider. Document Revised: 05/09/2018 Document Reviewed: 12/21/2017 Elsevier Patient Education  [place].  Facet Joint Block The facet joints connect the bones of the spine (vertebrae). They make it possible for you to bend, twist, and make other movements with your spine. They also keep you from bending too far, twisting too far, and making other extreme movements. A facet joint block is a procedure in which a numbing medicine  (anesthetic) is injected into a facet joint. In many cases, an anti-inflammatory medicine (steroid) is also injected. A facet joint block may be done:  To diagnose neck or back pain. If the pain gets better after a facet joint block, it means the pain is probably coming from the facet joint. If the pain does not get better, it means the pain is probably not coming from the facet joint.  To relieve neck or back pain that is caused by an inflamed facet joint. A facet joint block is only done to relieve pain if the pain does not improve with other methods, such as medicine, exercise programs, and physical therapy. Tell a health care provider about:  Any allergies you have.  All medicines you are taking, including vitamins, herbs, eye drops, creams, and over-the-counter medicines.  Any problems you or family members have had with anesthetic medicines.  Any blood disorders you have.  Any surgeries you have had.  Any medical conditions you have or have had.  Whether you are pregnant or may be pregnant. What are the risks? Generally, this is a safe procedure. However, problems may occur, including:  Bleeding.  Injury to a nerve near the injection site.  Pain at the injection site.  Weakness or numbness in areas controlled by nerves near the injection site.  Infection.  Temporary fluid retention.  Allergic reactions to medicines or dyes.  Injury to other structures or organs near  the injection site. What happens before the procedure? Medicines Ask your health care provider about:  Changing or stopping your regular medicines. This is especially important if you are taking diabetes medicines or blood thinners.  Taking medicines such as aspirin and ibuprofen. These medicines can thin your blood. Do not take these medicines unless your health care provider tells you to take them.  Taking over-the-counter medicines, vitamins, herbs, and supplements. Eating and drinking Follow  instructions from your health care provider about eating and drinking, which may include:  8 hours before the procedure - stop eating heavy meals or foods, such as meat, fried foods, or fatty foods.  6 hours before the procedure - stop eating light meals or foods, such as toast or cereal.  6 hours before the procedure - stop drinking milk or drinks that contain milk.  2 hours before the procedure - stop drinking clear liquids. Staying hydrated Follow instructions from your health care provider about hydration, which may include:  Up to 2 hours before the procedure - you may continue to drink clear liquids, such as water, clear fruit juice, black coffee, and plain tea. General instructions  Do not use any products that contain nicotine or tobacco for at least 4-6 weeks before the procedure. These products include cigarettes, e-cigarettes, and chewing tobacco. If you need help quitting, ask your health care provider.  Plan to have someone take you home from the hospital or clinic.  Ask your health care provider: ? How your surgery site will be marked. ? What steps will be taken to help prevent infection. These may include:  Removing hair at the surgery site.  Washing skin with a germ-killing soap.  Receiving antibiotic medicine. What happens during the procedure?   You will put on a hospital gown.  You will lie on your stomach on an X-ray table. You may be asked to lie in a different position if an injection will be made in your neck.  Machines will be used to monitor your oxygen levels, heart rate, and blood pressure.  Your skin will be cleaned.  If an injection will be made in your neck, an IV will be inserted into one of your veins. Fluids and medicine will flow directly into your body through the IV.  A numbing medicine (local anesthetic) will be applied to your skin. Your skin may sting or burn for a moment.  A video X-ray machine (fluoroscopy) will be used to find the  joint. In some cases, a CT scan may be used.  A contrast dye may be injected into the facet joint area to help find the joint.  When the joint is located, an anesthetic will be injected into the joint through the needle.  Your health care provider will ask you whether you feel pain relief. ? If you feel relief, a steroid may be injected to provide pain relief for a longer period of time. ? If you do not feel relief or feel only partial relief, additional injections of an anesthetic may be made in other facet joints.  The needle will be removed.  Your skin will be cleaned.  A bandage (dressing) will be applied over each injection site. The procedure may vary among health care providers and hospitals. What happens after the procedure?  Your blood pressure, heart rate, breathing rate, and blood oxygen level will be monitored until you leave the hospital or clinic.  You will lie down and rest for a period of time. Summary  A  facet joint block is a procedure in which a numbing medicine (anesthetic) is injected into a facet joint. An anti-inflammatory medicine (stereoid) may also be injected.  Follow instructions from your health care provider about medicines and eating and drinking before the procedure.  Do not use any products that contain nicotine or tobacco for at least 4-6 weeks before the procedure.  You will lie on your stomach for the procedure, but you may be asked to lie in a different position if an injection will be made in your neck.  When the joint is located, an anesthetic will be injected into the joint through the needle. This information is not intended to replace advice given to you by your health care provider. Make sure you discuss any questions you have with your health care provider. Document Revised: 05/09/2018 Document Reviewed: 12/21/2017 Elsevier Patient Education  [place].

## 2019-12-08 NOTE — Telephone Encounter (Signed)
She said the nurse told her she could get her medicine today but the pharmacy wont let her get it until the 11th. She is very confused and seems to be in a bit of a panic. Can Anderson Malta call her today and tell her what to do?

## 2019-12-09 DIAGNOSIS — M8949 Other hypertrophic osteoarthropathy, multiple sites: Secondary | ICD-10-CM | POA: Insufficient documentation

## 2019-12-09 DIAGNOSIS — M25552 Pain in left hip: Secondary | ICD-10-CM | POA: Insufficient documentation

## 2019-12-09 DIAGNOSIS — G8929 Other chronic pain: Secondary | ICD-10-CM | POA: Insufficient documentation

## 2019-12-09 DIAGNOSIS — M159 Polyosteoarthritis, unspecified: Secondary | ICD-10-CM | POA: Insufficient documentation

## 2019-12-09 DIAGNOSIS — M47816 Spondylosis without myelopathy or radiculopathy, lumbar region: Secondary | ICD-10-CM | POA: Insufficient documentation

## 2019-12-09 DIAGNOSIS — M150 Primary generalized (osteo)arthritis: Secondary | ICD-10-CM | POA: Insufficient documentation

## 2019-12-09 DIAGNOSIS — M1711 Unilateral primary osteoarthritis, right knee: Secondary | ICD-10-CM | POA: Insufficient documentation

## 2019-12-09 NOTE — Telephone Encounter (Signed)
I called patient and explained it is an insurance issue and she states she has enough to last until they can fill it on the 11th.

## 2019-12-11 ENCOUNTER — Other Ambulatory Visit: Payer: Self-pay

## 2019-12-11 ENCOUNTER — Ambulatory Visit (INDEPENDENT_AMBULATORY_CARE_PROVIDER_SITE_OTHER): Payer: Medicare Other

## 2019-12-11 DIAGNOSIS — Z23 Encounter for immunization: Secondary | ICD-10-CM

## 2019-12-15 NOTE — Progress Notes (Addendum)
Acute Office Visit  Subjective:    Patient ID: Tracy Mann, female    DOB: 12-Feb-1933, 86 y.o.   MRN: [id number]  HPI Patient is in today for insomnia.  She states she has had trouble falling asleep for about 2 months now.  She states she only gets about 4-5 hours of sleep each day.  She denies daytime sleeping.  She has tried to take Melatonin, Paroxetine and Duloxetine.  She is currently on Lorazepam 1 mg BID but states that it is no longer working.  She said that if she could take 2 at night she would probably be able to sleep.  Past Medical History:  Diagnosis Date   Acute myocardial infarction of inferior wall ([place])    a. 10/2005 - Presumed to be 2/2 to a coronary embolus in setting of persistent Afib-->Cath 2014 relatively nl cors, EF 45-50% w/ severe distal inferior/apical inferior HK w/ aneursymal appearance.   Arthritis    Chronic anticoagulation    a. warfarin.   Chronic atrial fibrillation (HCC)    a. CHA2DS2VASc = 6--> warfarin.   Chronic combined systolic and diastolic CHF (congestive heart failure) ([place])    a. 10/2012 Echo: EF 35-40%, diff HK, mild MR, mild biatrial enlargement;  b. 11/2012 EF 45-50% by LV gram; c. echo 07/2014: EF 40-45%, mod conc LVH, mod MR, severe biatrial enlargement, PASP 45 mm Hg c. Echo 01/2018 EF 45-50%, duffuse hypokinesis, mild MR, LA mod dilated, norm RVSF, dilated IVC   Essential hypertension    Hyperlipidemia, mixed    Mixed Ischemic/Nonischemic Cardiomyopathy    a. 10/2012 Echo: EF 35-40%;  b. 11/2012 EF 45-50% by LV gram.   Nonobstructive coronary artery disease    a. 2007 inferior wall MI thought to be secondary to thrombus from atrial fib b. 2014 cath with R dominant coronary arterial system with mild luminal irregularities c. Myoview 05/2015 old inferior MI, no new ischemic changes   Obesity    Thyroid disease    hypothyroidism   Type II diabetes mellitus ([place])    a. 07/2018 A1c 8.7 - long term insulin use   Venous  insufficiency    Vitamin D deficiency     Past Surgical History:  Procedure Laterality Date   CARDIAC CATHETERIZATION  11/2012   [facility];EF 45-50%   CHOLECYSTECTOMY  1999   TUBAL LIGATION  1968   VESICOVAGINAL FISTULA CLOSURE W/ TAH  1996    Family History  Problem Relation Age of Onset   Heart disease Mother    Thyroid disease Father     Social History   Socioeconomic History   Marital status: Widowed    Spouse name: widowed   Number of children: 4   Years of education: 14   Highest education level: 12th grade  Occupational History   Occupation: retired  Tobacco Use   Smoking status: Never Smoker   Smokeless tobacco: Never Used  Scientific laboratory technician Use: Never used  Substance and Sexual Activity   Alcohol use: No   Drug use: No   Sexual activity: Never  Other Topics Concern   Not on file  Social History Narrative   Widowed   Has children and grandchildren   Lives in [place] Determinants of Health   Financial Resource Strain:    Difficulty of Paying Living Expenses: Not on file  Food Insecurity:    Worried About [place] in the Last Year: Not on  file   [place] in the Last Year: Not on file  Transportation Needs:    Lack of Transportation (Medical): Not on file   Lack of Transportation (Non-Medical): Not on file  Physical Activity:    Days of Exercise per Week: Not on file   Minutes of Exercise per Session: Not on file  Stress:    Feeling of Stress : Not on file  Social Connections:    Frequency of Communication with Friends and Family: Not on file   Frequency of Social Gatherings with Friends and Family: Not on file   Attends Religious Services: Not on file   Active Member of [place] or Organizations: Not on file   Attends Archivist Meetings: Not on file   Marital Status: Not on file  Intimate Partner Violence:    Fear of Current or Ex-Partner: Not on file   Emotionally Abused: Not on file   Physically Abused:  Not on file   Sexually Abused: Not on file    Outpatient Medications Prior to Visit  Medication Sig Dispense Refill   ACCU-CHEK AVIVA PLUS test strip USE WITH METER TO CHECK GLUCOSE BEFORE MEALS AND AT BEDTIME 200 strip 12   estradiol (ESTRACE) 0.1 MG/GM vaginal cream Place 1 Applicatorful vaginally at bedtime. 42.5 g 0   ezetimibe (ZETIA) 10 MG tablet TAKE 1 TABLET BY MOUTH EVERY DAY 90 tablet 3   flavoxATE (URISPAS) 100 MG tablet Take 1 tablet (100 mg total) by mouth 2 (two) times daily as needed for bladder spasms. 60 tablet 11   fluticasone (FLONASE) 50 MCG/ACT nasal spray SPRAY TWO SPRAYS IN EACH NOSTRIL ONCE DAILY 48 mL 3   HYDROcodone-acetaminophen (NORCO) 10-325 MG tablet Take 1 tablet by mouth every 6 (six) hours as needed for severe pain. Must last 30 days. 120 tablet 0   insulin glargine (LANTUS SOLOSTAR) 100 UNIT/ML Solostar Pen Inject 20 units nightly between 8-10 PM. Adjust as directed. Max dose is 50 units daily.     insulin lispro (HUMALOG KWIKPEN) 100 UNIT/ML KwikPen INJECT 10-20 UNITS THREE TIMES DAILY WITH EACH MEAL. 15 mL 12   Insulin Pen Needle (RELION PEN NEEDLE 31G/8MM) 31G X 8 MM MISC Use with insulin pen three times daily 100 each 12   levothyroxine (SYNTHROID) 125 MCG tablet TAKE 1 TABLET (125 MCG TOTAL) BY MOUTH DAILY BEFORE BREAKFAST. 90 tablet 4   lisinopril (ZESTRIL) 2.5 MG tablet TAKE 1 TABLET BY MOUTH EVERYDAY AT BEDTIME 90 tablet 1   LORazepam (ATIVAN) 1 MG tablet TAKE 1 TABLET (1 MG TOTAL) BY MOUTH 2 (TWO) TIMES DAILY AS NEEDED. 60 tablet 1   Magnesium 500 MG CAPS Take 1,000 mg by mouth daily.      metolazone (ZAROXOLYN) 2.5 MG tablet Take 1 tablet (2.5 mg total) by mouth 2 (two) times a week. TAKE 30 minutes before Torsemide 26 tablet 1   metoprolol tartrate (LOPRESSOR) 25 MG tablet TAKE 1 TABLET BY MOUTH AS NEEDED PER DAY FOR HEART RATE PERSISTENTLY >100BPM 30 tablet 4   nitroGLYCERIN (NITROSTAT) 0.4 MG SL tablet Place 1 tablet (0.4 mg total) under the tongue  every 5 (five) minutes as needed for chest pain. 25 tablet 3   NONFORMULARY OR COMPOUNDED ITEM Diclofenac/Baclofen/Bu/Gabapentin Compounded Cream     phenazopyridine (PYRIDIUM) 200 MG tablet Take 1 tablet (200 mg total) by mouth 3 (three) times daily as needed for pain. 10 tablet 0   potassium chloride SA (KLOR-CON) 20 MEQ tablet Take 1-2 tablets with  metolazone     torsemide (DEMADEX) 20 MG tablet Take 1 tablet (20 mg total) by mouth daily. 90 tablet 1   Vitamin D, Ergocalciferol, (DRISDOL) 50000 units CAPS capsule Take 50,000 Units by mouth. Going to restart     warfarin (COUMADIN) 4 MG tablet TAKE 1 TABLET BY MOUTH EVERY DAY (Patient taking differently: Pt alter dose everyday. She takes coumadin 4mg  and 3 mg QOD) 90 tablet 4   No facility-administered medications prior to visit.    Allergies  Allergen Reactions   Duloxetine Other (See Comments)    Same as Paroxetine   Other Anaphylaxis and Swelling    'tongue swelling'   Paroxetine Other (See Comments)    "Horrible headache," Stomach pain, Diarrhea   Sulfa Antibiotics Anaphylaxis   Cephalexin     Blisters   Clarithromycin Other (See Comments)    Hives, headaches, hard time swelling, felt like throat was closing up Hives, headaches, hard time swelling, felt like throat was closing up   Diphenhydramine Other (See Comments)   Estrogens Conjugated    Estrogens, Conjugated Other (See Comments)   Sulfonamide Derivatives Swelling    'tongue swelling'   Nitrofurantoin Nausea Only    Review of Systems  Psychiatric/Behavioral: Positive for agitation (when she can't get to sleep), decreased concentration and sleep disturbance. Negative for confusion, dysphoric mood, hallucinations, self-injury and suicidal ideas. The patient is nervous/anxious and is hyperactive (sometimes).        Objective:    Physical Exam Constitutional:      General: She is not in acute distress.    Appearance: She is well-developed.  HENT:     Head:  Normocephalic and atraumatic.     Right Ear: Hearing normal.     Left Ear: Hearing normal.     Nose: Nose normal.  Eyes:     General: Lids are normal. No scleral icterus.       Right eye: No discharge.        Left eye: No discharge.     Conjunctiva/sclera: Conjunctivae normal.  Cardiovascular:     Rate and Rhythm: Normal rate and regular rhythm.     Pulses: Normal pulses.     Heart sounds: Normal heart sounds.  Pulmonary:     Effort: Pulmonary effort is normal. No respiratory distress.  Musculoskeletal:        General: Normal range of motion.     Cervical back: Normal range of motion and neck supple.  Skin:    Findings: No lesion or rash.  Neurological:     Mental Status: She is alert and oriented to person, place, and time.  Psychiatric:        Speech: Speech normal.        Behavior: Behavior normal.        Thought Content: Thought content normal.     BP 126/76 (BP Location: Right Arm, Patient Position: Sitting, Cuff Size: Normal)   Pulse 66   Temp 98 F (36.7 C) (Oral)   Wt 222 lb (100.7 kg)   SpO2 94%   BMI 36.38 kg/m  Wt Readings from Last 3 Encounters:  12/16/19 222 lb (100.7 kg)  12/08/19 213 lb 6.4 oz (96.8 kg)  11/26/19 219 lb (99.3 kg)    Health Maintenance Due  Topic Date Due   OPHTHALMOLOGY EXAM  07/14/2016   FOOT EXAM  09/19/2017   INFLUENZA VACCINE  Never done    There are no preventive care reminders to display for this patient.  Lab Results  Component Value Date   TSH 0.888 08/29/2018   Lab Results  Component Value Date   WBC 9.5 08/29/2018   HGB 13.5 08/29/2018   HCT 41.5 08/29/2018   MCV 91 08/29/2018   PLT 225 08/29/2018   Lab Results  Component Value Date   NA 142 07/29/2019   K 4.9 07/29/2019   CO2 26 07/29/2019   GLUCOSE 152 (H) 07/29/2019   BUN 26 07/29/2019   CREATININE 0.79 07/29/2019   BILITOT 0.5 08/29/2018   ALKPHOS 99 08/29/2018   AST 18 08/29/2018   ALT 13 08/29/2018   PROT 7.0 08/29/2018   ALBUMIN 4.0  08/29/2018   CALCIUM 9.1 07/29/2019   ANIONGAP 10 02/13/2018   Lab Results  Component Value Date   CHOL 172 01/17/2019   Lab Results  Component Value Date   HDL 57 01/17/2019   Lab Results  Component Value Date   LDLCALC 94 01/17/2019   Lab Results  Component Value Date   TRIG 119 01/17/2019   Lab Results  Component Value Date   CHOLHDL 3.0 01/17/2019   Lab Results  Component Value Date   HGBA1C 6.8 (A) 09/22/2019       Assessment & Plan:   1. Sleep disturbance More difficulty with initiating sleep the past 2 months. No sustained relief with past attempts with Melatonin, Duloxetine and Paroxetine. Will give trial of Trazodone 50 mg hs #30 with symptoms of recurrent depression and anxiety. Follow up   2. GAD (generalized anxiety disorder) Anxiety and irritability controlled with prn use of Lorazepam 1 mg BID. May need to consider counseling with psychologist to develop better coping skills.  3. Recurrent major depressive disorder, in partial remission ([place]) No suicidal ideation. Will encouraged to take the Trazodone each night to reduce depressive symptoms. Recheck with PCP.   Juluis Mire, [place], Vernie Murders, [place], have reviewed all documentation for this visit. The documentation on 12/16/19 for the exam, diagnosis, procedures, and orders are all accurate and complete.

## 2019-12-16 ENCOUNTER — Ambulatory Visit (INDEPENDENT_AMBULATORY_CARE_PROVIDER_SITE_OTHER): Payer: Medicare Other | Admitting: Family Medicine

## 2019-12-16 ENCOUNTER — Other Ambulatory Visit: Payer: Self-pay

## 2019-12-16 ENCOUNTER — Encounter: Payer: Self-pay | Admitting: Family Medicine

## 2019-12-16 VITALS — BP 126/76 | HR 66 | Temp 98.0°F | Wt 222.0 lb

## 2019-12-16 DIAGNOSIS — F3341 Major depressive disorder, recurrent, in partial remission: Secondary | ICD-10-CM

## 2019-12-16 DIAGNOSIS — F411 Generalized anxiety disorder: Secondary | ICD-10-CM | POA: Diagnosis not present

## 2019-12-16 DIAGNOSIS — G479 Sleep disorder, unspecified: Secondary | ICD-10-CM | POA: Diagnosis not present

## 2019-12-16 MED ORDER — TRAZODONE HCL 50 MG PO TABS: 50.0000 mg | ORAL_TABLET | Freq: Every day | ORAL | 0 refills | Status: DC

## 2019-12-17 ENCOUNTER — Telehealth: Payer: Self-pay

## 2019-12-17 NOTE — Telephone Encounter (Signed)
Patient was in to see [facility] yesterday for insomnia.  She is having trouble falling asleep.  States that when she goes to bed her mind starts racing.  She has been on Paroxetine and Duloxetine and had bad side effects.  He gave her Trazodone yesterday and she reports that when she took it last night she began having head pain, facial fullness and then some wheezing.  She said she wasn't able to fall asleep until after 3 am.  This morning she said those symptoms were better.  She is asking if Wellbutrin might work for her since she is unable to take the SSRIs.

## 2019-12-19 ENCOUNTER — Encounter: Payer: Self-pay | Admitting: Podiatry

## 2019-12-19 ENCOUNTER — Other Ambulatory Visit: Payer: Self-pay

## 2019-12-19 ENCOUNTER — Ambulatory Visit: Payer: Medicare Other | Admitting: Podiatry

## 2019-12-19 ENCOUNTER — Ambulatory Visit (INDEPENDENT_AMBULATORY_CARE_PROVIDER_SITE_OTHER): Payer: Medicare Other

## 2019-12-19 DIAGNOSIS — I8393 Asymptomatic varicose veins of bilateral lower extremities: Secondary | ICD-10-CM | POA: Diagnosis not present

## 2019-12-19 DIAGNOSIS — M722 Plantar fascial fibromatosis: Secondary | ICD-10-CM

## 2019-12-19 MED ORDER — NONFORMULARY OR COMPOUNDED ITEM: 3 refills | Status: DC

## 2019-12-19 NOTE — Addendum Note (Signed)
Addended by: Graceann Congress D on: 12/19/2019 04:28 PM   Modules accepted: Orders

## 2019-12-19 NOTE — Progress Notes (Signed)
Subjective: 86 y.o. female presenting today for evaluation of bilateral heel pain is been going on approximately 2 weeks left greater than the right.  She states that most recently she has been applying a compound cream which helps with the pain.  She denies a history of injury.  She presents for further treatment and evaluation   Past Medical History:  Diagnosis Date  . Acute myocardial infarction of inferior wall ([place])    a. 10/2005 - Presumed to be 2/2 to a coronary embolus in setting of persistent Afib-->Cath 2014 relatively nl cors, EF 45-50% w/ severe distal inferior/apical inferior HK w/ aneursymal appearance.  . Arthritis   . Chronic anticoagulation    a. warfarin.  . Chronic atrial fibrillation (HCC)    a. CHA2DS2VASc = 6--> warfarin.  . Chronic combined systolic and diastolic CHF (congestive heart failure) ([place])    a. 10/2012 Echo: EF 35-40%, diff HK, mild MR, mild biatrial enlargement;  b. 11/2012 EF 45-50% by LV gram; c. echo 07/2014: EF 40-45%, mod conc LVH, mod MR, severe biatrial enlargement, PASP 45 mm Hg c. Echo 01/2018 EF 45-50%, duffuse hypokinesis, mild MR, LA mod dilated, norm RVSF, dilated IVC  . Essential hypertension   . Hyperlipidemia, mixed   . Mixed Ischemic/Nonischemic Cardiomyopathy    a. 10/2012 Echo: EF 35-40%;  b. 11/2012 EF 45-50% by LV gram.  . Nonobstructive coronary artery disease    a. 2007 inferior wall MI thought to be secondary to thrombus from atrial fib b. 2014 cath with R dominant coronary arterial system with mild luminal irregularities c. Myoview 05/2015 old inferior MI, no new ischemic changes  . Obesity   . Thyroid disease    hypothyroidism  . Type II diabetes mellitus ([place])    a. 07/2018 A1c 8.7 - long term insulin use  . Venous insufficiency   . Vitamin D deficiency      Objective: Physical Exam General: The patient is alert and oriented x3 in no acute distress.  Dermatology: Skin is warm, dry and supple bilateral lower extremities.  Negative for open lesions or macerations bilateral.   Vascular: Dorsalis Pedis and Posterior Tibial pulses palpable bilateral.  Capillary fill time is immediate to all digits.  Neurological: Epicritic and protective threshold intact bilateral.   Musculoskeletal: Tenderness to palpation to the plantar aspect of the bilateral heels along the plantar fascia. All other joints range of motion within normal limits bilateral. Strength 5/5 in all groups bilateral.   Radiographic exam: Diffuse demineralization and joint space narrowing noted given the patient's age this is likely within normal limits.  Joint spaces preserved. No fracture/dislocation/boney destruction. No other soft tissue abnormalities or radiopaque foreign bodies.   Assessment: 1. plantar fasciitis bilateral feet  Plan of Care:  1. Patient evaluated. Xrays reviewed.   2. Injection of 0.5cc [place] soluspan injected into the bilateral heels.  3.  Order placed for prescription topical compounding cream sent to [facility] 4.  Recommend OTC Tylenol as needed.  Patient is on anticoagulant therapy warfarin and cannot take NSAIDs 5.  Return to clinic as needed  Edrick Kins, DPM [facility]  Dr. Edrick Kins, DPM    [address], [place] [address]  Office [phone]  Fax [phone]

## 2019-12-22 ENCOUNTER — Ambulatory Visit: Payer: Medicare Other | Admitting: Podiatry

## 2019-12-23 ENCOUNTER — Telehealth: Payer: Self-pay | Admitting: Family Medicine

## 2019-12-23 NOTE — Telephone Encounter (Signed)
Patient called to ask the nurse or doctor if it would alright for her to the mineral tablets that Dr. Horton Finer suggested she take.  Patient is concerned because she takes [place] and is not sure if she should take the vitamins with that as well.  Please advise and call patient to let her know.  CB# [phone]

## 2019-12-23 NOTE — Telephone Encounter (Signed)
Patient was advised.  

## 2019-12-23 NOTE — Telephone Encounter (Signed)
She needs to check the amount of vitamin K, it's safe to take if there is no more the 50 micrograms (mcg) of vitamin K

## 2019-12-23 NOTE — Telephone Encounter (Signed)
Patient reports that she just started taking a multivitamin and it has Vitamin K in it. She is not aware of the dose. She reports that it doesn't show the Mg. She wants to know is it safe to take while she is taking coumadin? Please advise. Dr. Rosanna Randy pt. Thanks!

## 2019-12-29 ENCOUNTER — Telehealth: Payer: Self-pay | Admitting: Family Medicine

## 2019-12-29 NOTE — Telephone Encounter (Signed)
Patient called to ask the nurse or doctor to call her regarding her compound medication that needs to be filled.  She stated that she usually calls to request this medication.  Please advise and call patient to discuss at [phone]

## 2019-12-30 ENCOUNTER — Telehealth: Payer: Self-pay | Admitting: Pain Medicine

## 2019-12-30 NOTE — Telephone Encounter (Signed)
Patient went to podiatrist and received an injection in her leg for foot pain, she states she needs another one but is also scheduled here on 01-06-20 for procedure. Says it caused swelling and her blood sugar to go up. But wants to know if she can go get another one and still have the injections from Dr. Dossie Arbour.  Also she is scheduled for Meds Mgmt on 01-05-20 and procedure the very next day. Please ask if we can combine these appts.

## 2019-12-30 NOTE — Telephone Encounter (Signed)
Called and talked with patient she states that when she had a foot injection her BS went over 500. She wants another foot injection and to also keep procedure her procedure with Dr Dossie Arbour.  Tole her that she needs to waith  2 weeks for steroids. That she will need to figure out if she wants the foot done 1st. We can always reschedule Dr Dossie Arbour appt. Patient  is having appt with PCP in am and will discuss issue with her BS and areas on her legs, and will go from there with treatments. Patient will keep her appt on 01/05/2020 for MM.

## 2019-12-31 ENCOUNTER — Encounter: Payer: Self-pay | Admitting: Adult Health

## 2019-12-31 ENCOUNTER — Encounter: Payer: Self-pay | Admitting: Pain Medicine

## 2019-12-31 ENCOUNTER — Ambulatory Visit (INDEPENDENT_AMBULATORY_CARE_PROVIDER_SITE_OTHER): Payer: Medicare Other | Admitting: Adult Health

## 2019-12-31 ENCOUNTER — Ambulatory Visit: Payer: Self-pay

## 2019-12-31 ENCOUNTER — Telehealth: Payer: Self-pay | Admitting: Pain Medicine

## 2019-12-31 ENCOUNTER — Other Ambulatory Visit: Payer: Self-pay

## 2019-12-31 VITALS — BP 114/55 | HR 89 | Temp 98.6°F | Wt 227.0 lb

## 2019-12-31 DIAGNOSIS — E1140 Type 2 diabetes mellitus with diabetic neuropathy, unspecified: Secondary | ICD-10-CM

## 2019-12-31 DIAGNOSIS — M7989 Other specified soft tissue disorders: Secondary | ICD-10-CM

## 2019-12-31 DIAGNOSIS — M79661 Pain in right lower leg: Secondary | ICD-10-CM

## 2019-12-31 DIAGNOSIS — I872 Venous insufficiency (chronic) (peripheral): Secondary | ICD-10-CM

## 2019-12-31 DIAGNOSIS — L0390 Cellulitis, unspecified: Secondary | ICD-10-CM

## 2019-12-31 DIAGNOSIS — L03115 Cellulitis of right lower limb: Secondary | ICD-10-CM | POA: Diagnosis not present

## 2019-12-31 DIAGNOSIS — E039 Hypothyroidism, unspecified: Secondary | ICD-10-CM | POA: Diagnosis not present

## 2019-12-31 DIAGNOSIS — R001 Bradycardia, unspecified: Secondary | ICD-10-CM | POA: Diagnosis not present

## 2019-12-31 DIAGNOSIS — R400 Somnolence: Secondary | ICD-10-CM | POA: Diagnosis not present

## 2019-12-31 DIAGNOSIS — I482 Chronic atrial fibrillation, unspecified: Secondary | ICD-10-CM | POA: Diagnosis not present

## 2019-12-31 DIAGNOSIS — Z5181 Encounter for therapeutic drug level monitoring: Secondary | ICD-10-CM | POA: Diagnosis not present

## 2019-12-31 MED ORDER — NONFORMULARY OR COMPOUNDED ITEM
3 refills | Status: DC
Start: 2019-12-31 — End: 2020-05-05

## 2019-12-31 MED ORDER — AMOXICILLIN-POT CLAVULANATE 875-125 MG PO TABS: 1.0000 | ORAL_TABLET | Freq: Two times a day (BID) | ORAL | 0 refills | Status: DC

## 2019-12-31 MED ORDER — LEVOTHYROXINE SODIUM 125 MCG PO TABS
125.0000 ug | ORAL_TABLET | Freq: Every day | ORAL | 0 refills | Status: DC
Start: 2019-12-31 — End: 2020-01-01

## 2019-12-31 NOTE — Telephone Encounter (Signed)
Called [facility] with refill for compound cream. Advised patient.

## 2019-12-31 NOTE — Telephone Encounter (Signed)
error 

## 2019-12-31 NOTE — Progress Notes (Signed)
Acute Office Visit  Subjective:    Patient ID: Tracy Mann, female    DOB: Feb 18, 1933, 86 y.o.   MRN: [id number]  Chief Complaint  Patient presents with  . Recurrent Skin Infections  . Wound Infection    HPI Patient is in today for wound of her right lower leg anterior aspect, she has weeping of bilateral legs at times reported chronic.  The wound of right lower leg is red and raw. She has a history of heart failure, chronic edema of lower legs. She has seen Dr. Delana Meyer - vascular in the past and advised to wear compression stockings which she tries to do however this wound has reoccurred.  She has been seen for this in the past.   She denies any new or worsening shortness of breath or any leg cramping.  Seen at pain clinic, needs PT/ INR.   Was scheduled to have back injection next week however they wanted her legs looked at prior/   She asks for refills on her synthroid, lorazepam( not due yet) and her compound cream.    Patient  denies any fever, body aches,chills, rash, chest pain,  nausea, vomiting, or diarrhea.  Denies dizziness, lightheadedness, pre syncopal or syncopal episodes.     Past Medical History:  Diagnosis Date  . Acute myocardial infarction of inferior wall ([place])    a. 10/2005 - Presumed to be 2/2 to a coronary embolus in setting of persistent Afib-->Cath 2014 relatively nl cors, EF 45-50% w/ severe distal inferior/apical inferior HK w/ aneursymal appearance.  . Arthritis   . Chronic anticoagulation    a. warfarin.  . Chronic atrial fibrillation (HCC)    a. CHA2DS2VASc = 6--> warfarin.  . Chronic combined systolic and diastolic CHF (congestive heart failure) ([place])    a. 10/2012 Echo: EF 35-40%, diff HK, mild MR, mild biatrial enlargement;  b. 11/2012 EF 45-50% by LV gram; c. echo 07/2014: EF 40-45%, mod conc LVH, mod MR, severe biatrial enlargement, PASP 45 mm Hg c. Echo 01/2018 EF 45-50%, duffuse hypokinesis, mild MR, LA mod dilated, norm RVSF, dilated IVC   . Essential hypertension   . Hyperlipidemia, mixed   . Mixed Ischemic/Nonischemic Cardiomyopathy    a. 10/2012 Echo: EF 35-40%;  b. 11/2012 EF 45-50% by LV gram.  . Nonobstructive coronary artery disease    a. 2007 inferior wall MI thought to be secondary to thrombus from atrial fib b. 2014 cath with R dominant coronary arterial system with mild luminal irregularities c. Myoview 05/2015 old inferior MI, no new ischemic changes  . Obesity   . Thyroid disease    hypothyroidism  . Type II diabetes mellitus ([place])    a. 07/2018 A1c 8.7 - long term insulin use  . Venous insufficiency   . Vitamin D deficiency     Past Surgical History:  Procedure Laterality Date  . CARDIAC CATHETERIZATION  11/2012   [facility];EF 45-50%  . CHOLECYSTECTOMY  1999  . TUBAL LIGATION  1968  . VESICOVAGINAL FISTULA CLOSURE W/ TAH  1996    Family History  Problem Relation Age of Onset  . Heart disease Mother   . Thyroid disease Father     Social History   Socioeconomic History  . Marital status: Widowed    Spouse name: widowed  . Number of children: 4  . Years of education: 38  . Highest education level: 12th grade  Occupational History  . Occupation: retired  Tobacco Use  . Smoking status: Never Smoker  .  Smokeless tobacco: Never Used  Vaping Use  . Vaping Use: Never used  Substance and Sexual Activity  . Alcohol use: No  . Drug use: No  . Sexual activity: Never  Other Topics Concern  . Not on file  Social History Narrative   Widowed   Has children and grandchildren   Lives in [place]   Social Determinants of Health   Financial Resource Strain:   . Difficulty of Paying Living Expenses: Not on file  Food Insecurity:   . Worried About Charity fundraiser in the Last Year: Not on file  . Ran Out of Food in the Last Year: Not on file  Transportation Needs:   . Lack of Transportation (Medical): Not on file  . Lack of Transportation (Non-Medical): Not on file  Physical Activity:   . Days  of Exercise per Week: Not on file  . Minutes of Exercise per Session: Not on file  Stress:   . Feeling of Stress : Not on file  Social Connections:   . Frequency of Communication with Friends and Family: Not on file  . Frequency of Social Gatherings with Friends and Family: Not on file  . Attends Religious Services: Not on file  . Active Member of Clubs or Organizations: Not on file  . Attends Archivist Meetings: Not on file  . Marital Status: Not on file  Intimate Partner Violence:   . Fear of Current or Ex-Partner: Not on file  . Emotionally Abused: Not on file  . Physically Abused: Not on file  . Sexually Abused: Not on file    Outpatient Medications Prior to Visit  Medication Sig Dispense Refill  . ACCU-CHEK AVIVA PLUS test strip USE WITH METER TO CHECK GLUCOSE BEFORE MEALS AND AT BEDTIME 200 strip 12  . estradiol (ESTRACE) 0.1 MG/GM vaginal cream Place 1 Applicatorful vaginally at bedtime. 42.5 g 0  . ezetimibe (ZETIA) 10 MG tablet TAKE 1 TABLET BY MOUTH EVERY DAY 90 tablet 3  . flavoxATE (URISPAS) 100 MG tablet Take 1 tablet (100 mg total) by mouth 2 (two) times daily as needed for bladder spasms. 60 tablet 11  . fluticasone (FLONASE) 50 MCG/ACT nasal spray SPRAY TWO SPRAYS IN EACH NOSTRIL ONCE DAILY 48 mL 3  . HYDROcodone-acetaminophen (NORCO) 10-325 MG tablet Take 1 tablet by mouth every 6 (six) hours as needed for severe pain. Must last 30 days. 120 tablet 0  . insulin glargine (LANTUS SOLOSTAR) 100 UNIT/ML Solostar Pen Inject 20 units nightly between 8-10 PM. Adjust as directed. Max dose is 50 units daily.    . insulin lispro (HUMALOG KWIKPEN) 100 UNIT/ML KwikPen INJECT 10-20 UNITS THREE TIMES DAILY WITH EACH MEAL. 15 mL 12  . Insulin Pen Needle (RELION PEN NEEDLE 31G/8MM) 31G X 8 MM MISC Use with insulin pen three times daily 100 each 12  . lisinopril (ZESTRIL) 2.5 MG tablet TAKE 1 TABLET BY MOUTH EVERYDAY AT BEDTIME 90 tablet 1  . LORazepam (ATIVAN) 1 MG tablet  TAKE 1 TABLET (1 MG TOTAL) BY MOUTH 2 (TWO) TIMES DAILY AS NEEDED. 60 tablet 1  . Magnesium 500 MG CAPS Take 1,000 mg by mouth daily.     . metolazone (ZAROXOLYN) 2.5 MG tablet Take 1 tablet (2.5 mg total) by mouth 2 (two) times a week. TAKE 30 minutes before Torsemide 26 tablet 1  . nitroGLYCERIN (NITROSTAT) 0.4 MG SL tablet Place 1 tablet (0.4 mg total) under the tongue every 5 (five) minutes as needed for chest  pain. 25 tablet 3  . NONFORMULARY OR COMPOUNDED ITEM Diclofenac/Baclofen/Bu/Gabapentin Compounded Cream    . phenazopyridine (PYRIDIUM) 200 MG tablet Take 1 tablet (200 mg total) by mouth 3 (three) times daily as needed for pain. 10 tablet 0  . potassium chloride SA (KLOR-CON) 20 MEQ tablet Take 1-2 tablets with metolazone    . torsemide (DEMADEX) 20 MG tablet Take 1 tablet (20 mg total) by mouth daily. 90 tablet 1  . Vitamin D, Ergocalciferol, (DRISDOL) 50000 units CAPS capsule Take 50,000 Units by mouth. Going to restart    . warfarin (COUMADIN) 4 MG tablet TAKE 1 TABLET BY MOUTH EVERY DAY (Patient taking differently: Pt alter dose everyday. She takes coumadin 4mg  and 3 mg QOD) 90 tablet 4  . levothyroxine (SYNTHROID) 125 MCG tablet TAKE 1 TABLET (125 MCG TOTAL) BY MOUTH DAILY BEFORE BREAKFAST. 90 tablet 4  . NONFORMULARY OR COMPOUNDED ITEM See pharmacy note 120 each 3   No facility-administered medications prior to visit.    Allergies  Allergen Reactions  . Duloxetine Other (See Comments)    Same as Paroxetine  . Other Anaphylaxis and Swelling    'tongue swelling'  . Paroxetine Other (See Comments)    "Horrible headache," Stomach pain, Diarrhea  . Sulfa Antibiotics Anaphylaxis  . Cephalexin     Blisters  . Clarithromycin Other (See Comments)    Hives, headaches, hard time swelling, felt like throat was closing up Hives, headaches, hard time swelling, felt like throat was closing up  . Diphenhydramine Other (See Comments)  . Estrogens Conjugated   . Estrogens, Conjugated  Other (See Comments)  . Sulfonamide Derivatives Swelling    'tongue swelling'  . Nitrofurantoin Nausea Only    Review of Systems  Constitutional: Positive for fatigue. Negative for activity change, appetite change, chills, diaphoresis, fever and unexpected weight change.  Respiratory: Positive for shortness of breath (denies any change.). Negative for apnea, cough, choking, chest tightness, wheezing and stridor.   Cardiovascular: Positive for leg swelling. Negative for chest pain and palpitations.  Musculoskeletal: Positive for arthralgias and back pain (chronic ).  Skin: Positive for color change and wound.  Hematological: Negative.   Psychiatric/Behavioral: Negative.        Objective:    Physical Exam Constitutional:      Appearance: She is obese. She is not diaphoretic.  HENT:     Head: Normocephalic and atraumatic.     Right Ear: External ear normal.     Left Ear: External ear normal.  Cardiovascular:     Rate and Rhythm: Normal rate and regular rhythm.     Pulses: Normal pulses.     Heart sounds: Normal heart sounds.  Pulmonary:     Effort: Pulmonary effort is normal. No respiratory distress.     Breath sounds: Normal breath sounds. No stridor. No wheezing, rhonchi or rales.  Chest:     Chest wall: No tenderness.  Abdominal:     General: There is no distension.     Palpations: Abdomen is soft.     Tenderness: There is no abdominal tenderness.  Musculoskeletal:        General: Normal range of motion.     Cervical back: Normal range of motion and neck supple.     Right lower leg: Edema (bilateral 2 plus, left lower extremity with scant weeping. ) present.     Left lower leg: Edema present.  Skin:    General: Skin is warm.     Findings: Abrasion, erythema and wound  present.          Comments: Lower extremity peripheral vascular insufficiency]  1 bilateral posterior tibial pulses.   Neurological:     Mental Status: She is alert and oriented to person, place, and  time.     Motor: No weakness.     Gait: Gait normal.  Psychiatric:        Mood and Affect: Mood normal.        Behavior: Behavior normal.        Thought Content: Thought content normal.        Judgment: Judgment normal.   Media Information   Document Information  Photos  Right lower leg   12/31/2019 11:40  Attached To:  Office Visit on 12/31/19 with Ladoris Lythgoe, Kelby Aline, FNP  Source Information  Johnta Couts, Kelby Aline, FNP  [facility]   Media Information   Document Information  Photos  Left lower leg   12/31/2019 11:19  Attached To:  Office Visit on 12/31/19 with Jeneen Doutt, Kelby Aline, FNP  Source Information  Bhavana Kady, Kelby Aline, FNP  [facility]   Media Information   Document Information  Photos  Right lower leg   12/31/2019 11:19  Attached To:  Office Visit on 12/31/19 with Calysta Craigo, Kelby Aline, FNP  Source Information  Jazara Swiney, Kelby Aline, FNP  [facility]     BP (!) 114/55   Pulse 89   Temp 98.6 F (37 C) (Oral)   Wt 227 lb (103 kg)   SpO2 97%   BMI 37.20 kg/m  Wt Readings from Last 3 Encounters:  12/31/19 227 lb (103 kg)  12/16/19 222 lb (100.7 kg)  12/08/19 213 lb 6.4 oz (96.8 kg)    Health Maintenance Due  Topic Date Due  . OPHTHALMOLOGY EXAM  07/14/2016  . FOOT EXAM  09/19/2017  . INFLUENZA VACCINE  Never done    There are no preventive care reminders to display for this patient.   Lab Results  Component Value Date   TSH 0.888 08/29/2018   Lab Results  Component Value Date   WBC 9.5 08/29/2018   HGB 13.5 08/29/2018   HCT 41.5 08/29/2018   MCV 91 08/29/2018   PLT 225 08/29/2018   Lab Results  Component Value Date   NA 142 07/29/2019   K 4.9 07/29/2019   CO2 26 07/29/2019   GLUCOSE 152 (H) 07/29/2019   BUN 26 07/29/2019   CREATININE 0.79 07/29/2019   BILITOT 0.5 08/29/2018   ALKPHOS 99 08/29/2018   AST 18 08/29/2018   ALT 13 08/29/2018   PROT 7.0 08/29/2018   ALBUMIN 4.0 08/29/2018     CALCIUM 9.1 07/29/2019   ANIONGAP 10 02/13/2018   Lab Results  Component Value Date   CHOL 172 01/17/2019   Lab Results  Component Value Date   HDL 57 01/17/2019   Lab Results  Component Value Date   LDLCALC 94 01/17/2019   Lab Results  Component Value Date   TRIG 119 01/17/2019   Lab Results  Component Value Date   CHOLHDL 3.0 01/17/2019   Lab Results  Component Value Date   HGBA1C 6.8 (A) 09/22/2019       Assessment & Plan:   Problem List Items Addressed This Visit      Cardiovascular and Mediastinum   Chronic venous insufficiency - Primary (Chronic)   Relevant Orders   INR/PT   CBC with Differential/Platelet   B Nat Peptide   Comprehensive Metabolic Panel (CMET)  Ambulatory referral to Wound Clinic     Endocrine   Hypothyroidism   Relevant Medications   levothyroxine (SYNTHROID) 125 MCG tablet   Other Relevant Orders   TSH    Other Visit Diagnoses    Cellulitis of right lower leg       Relevant Orders   Ambulatory referral to Wound Clinic   Wound cellulitis       Relevant Orders   CBC with Differential/Platelet   B Nat Peptide   Ambulatory referral to Wound Clinic   US Venous Img Lower Unilateral Right (DVT)   Type 2 diabetes mellitus with diabetic neuropathy, without long-term current use of insulin (HCC)       Relevant Orders   Hemoglobin A1c   Pain and swelling of right lower leg       Relevant Orders   US Venous Img Lower Unilateral Right (DVT)     The primary encounter diagnosis was Chronic venous insufficiency. Diagnoses of Cellulitis of right lower leg, Wound cellulitis, Hypothyroidism, unspecified type, Type 2 diabetes mellitus with diabetic neuropathy, without long-term current use of insulin ([place]), and Pain and swelling of right lower leg were also pertinent to this visit. She has taken Augmentin without difficulty she reports.  Recheck wound in 2 days.  Ambulatory referral  to wound center. Would hold off of any steroid  injections at this time.   Recheck INR/PT in one week. Labs today.  Meds ordered this encounter  Medications  . amoxicillin-clavulanate (AUGMENTIN) 875-125 MG tablet    Sig: Take 1 tablet by mouth 2 (two) times daily.    Dispense:  20 tablet    Refill:  0  . NONFORMULARY OR COMPOUNDED ITEM    Sig: See pharmacy note- Not to use on any open skin or wound.    Dispense:  120 each    Refill:  3    [facility]  [place] Tendonitis Cream- Diclofenac 3%, Baclofen 2%, Bupivacaine 1%, Doxepin 5%, Gabapentin 6%, Ibuprofen 3%, Pentoxifylline 3% Apply 1-2 grams to affected area 3-4 times daily Qty. 120 gm 3 refills  . levothyroxine (SYNTHROID) 125 MCG tablet    Sig: Take 1 tablet (125 mcg total) by mouth daily before breakfast.    Dispense:  90 tablet    Refill:  0    Meds ordered this encounter  Medications  . amoxicillin-clavulanate (AUGMENTIN) 875-125 MG tablet    Sig: Take 1 tablet by mouth 2 (two) times daily.    Dispense:  20 tablet    Refill:  0  . NONFORMULARY OR COMPOUNDED ITEM    Sig: See pharmacy note- Not to use on any open skin or wound.    Dispense:  120 each    Refill:  3    [facility]  [place] Tendonitis Cream- Diclofenac 3%, Baclofen 2%, Bupivacaine 1%, Doxepin 5%, Gabapentin 6%, Ibuprofen 3%, Pentoxifylline 3% Apply 1-2 grams to affected area 3-4 times daily Qty. 120 gm 3 refills  . levothyroxine (SYNTHROID) 125 MCG tablet    Sig: Take 1 tablet (125 mcg total) by mouth daily before breakfast.    Dispense:  90 tablet    Refill:  0   Return in about 2 days (around 01/02/2020), or if symptoms worsen or fail to improve, for at any time for any worsening symptoms, Go to Emergency room/ urgent care if worse.   Red Flags discussed. The patient was given clear instructions to go to ER or return to medical center if any red flags develop,  symptoms do not improve, worsen or new problems develop. They verbalized understanding.  Marcille Buffy, FNP

## 2019-12-31 NOTE — Patient Instructions (Addendum)
Elevated your legs.  Labs today. Recheck in 2 days, refer to wound clinic placed. No steroid injection advised at this time.   Cellulitis, Adult  Cellulitis is a skin infection. The infected area is often warm, red, swollen, and sore. It occurs most often in the arms and lower legs. It is very important to get treated for this condition. What are the causes? This condition is caused by bacteria. The bacteria enter through a break in the skin, such as a cut, burn, insect bite, open sore, or crack. What increases the risk? This condition is more likely to occur in people who:  Have a weak body defense system (immune system).  Have open cuts, burns, bites, or scrapes on the skin.  Are older than 84 years of age.  Have a blood sugar problem (diabetes).  Have a long-lasting (chronic) liver disease (cirrhosis) or kidney disease.  Are very overweight (obese).  Have a skin problem, such as: ? Itchy rash (eczema). ? Slow movement of blood in the veins (venous stasis). ? Fluid buildup below the skin (edema).  Have been treated with high-energy rays (radiation).  Use IV drugs. What are the signs or symptoms? Symptoms of this condition include:  Skin that is: ? Red. ? Streaking. ? Spotting. ? Swollen. ? Sore or painful when you touch it. ? Warm.  A fever.  Chills.  Blisters. How is this diagnosed? This condition is diagnosed based on:  Medical history.  Physical exam.  Blood tests.  Imaging tests. How is this treated? Treatment for this condition may include:  Medicines to treat infections or allergies.  Home care, such as: ? Rest. ? Placing cold or warm cloths (compresses) on the skin.  Hospital care, if the condition is very bad. Follow these instructions at home: Medicines  Take over-the-counter and prescription medicines only as told by your doctor.  If you were prescribed an antibiotic medicine, take it as told by your doctor. Do not stop taking it  even if you start to feel better. General instructions   Drink enough fluid to keep your pee (urine) pale yellow.  Do not touch or rub the infected area.  Raise (elevate) the infected area above the level of your heart while you are sitting or lying down.  Place cold or warm cloths on the area as told by your doctor.  Keep all follow-up visits as told by your doctor. This is important. Contact a doctor if:  You have a fever.  You do not start to get better after 1-2 days of treatment.  Your bone or joint under the infected area starts to hurt after the skin has healed.  Your infection comes back. This can happen in the same area or another area.  You have a swollen bump in the area.  You have new symptoms.  You feel ill and have muscle aches and pains. Get help right away if:  Your symptoms get worse.  You feel very sleepy.  You throw up (vomit) or have watery poop (diarrhea) for a long time.  You see red streaks coming from the area.  Your red area gets larger.  Your red area turns dark in color. These symptoms may represent a serious problem that is an emergency. Do not wait to see if the symptoms will go away. Get medical help right away. Call your local emergency services (911 in the [place] Do not drive yourself to the hospital. Summary  Cellulitis is a skin infection. The area is  often warm, red, swollen, and sore.  This condition is treated with medicines, rest, and cold and warm cloths.  Take all medicines only as told by your doctor.  Tell your doctor if symptoms do not start to get better after 1-2 days of treatment. This information is not intended to replace advice given to you by your health care provider. Make sure you discuss any questions you have with your health care provider. Document Revised: 06/07/2017 Document Reviewed: 06/07/2017 Elsevier Patient Education  [place].

## 2020-01-01 ENCOUNTER — Telehealth: Payer: Self-pay

## 2020-01-01 ENCOUNTER — Other Ambulatory Visit: Payer: Self-pay

## 2020-01-01 ENCOUNTER — Ambulatory Visit
Admission: RE | Admit: 2020-01-01 | Discharge: 2020-01-01 | Disposition: A | Discharge status: Home / Self Care | Payer: Medicare Other | Source: Ambulatory Visit | Attending: Adult Health | Admitting: Adult Health

## 2020-01-01 ENCOUNTER — Other Ambulatory Visit: Payer: Self-pay | Admitting: Adult Health

## 2020-01-01 DIAGNOSIS — M7989 Other specified soft tissue disorders: Secondary | ICD-10-CM | POA: Insufficient documentation

## 2020-01-01 DIAGNOSIS — R600 Localized edema: Secondary | ICD-10-CM | POA: Diagnosis not present

## 2020-01-01 DIAGNOSIS — L0390 Cellulitis, unspecified: Secondary | ICD-10-CM | POA: Diagnosis not present

## 2020-01-01 DIAGNOSIS — M79661 Pain in right lower leg: Secondary | ICD-10-CM | POA: Diagnosis not present

## 2020-01-01 LAB — CBC WITH DIFFERENTIAL/PLATELET
Basophils Absolute: 0.1 x10E3/uL (ref 0.0–0.2)
Basos: 0 %
EOS (ABSOLUTE): 0.2 x10E3/uL (ref 0.0–0.4)
Eos: 2 %
Hematocrit: 37.1 % (ref 34.0–46.6)
Hemoglobin: 12.7 g/dL (ref 11.1–15.9)
Immature Grans (Abs): 0 x10E3/uL (ref 0.0–0.1)
Immature Granulocytes: 0 %
Lymphocytes Absolute: 1.9 x10E3/uL (ref 0.7–3.1)
Lymphs: 15 %
MCH: 30.2 pg (ref 26.6–33.0)
MCHC: 34.2 g/dL (ref 31.5–35.7)
MCV: 88 fL (ref 79–97)
Monocytes Absolute: 0.9 x10E3/uL (ref 0.1–0.9)
Monocytes: 8 %
Neutrophils Absolute: 9 x10E3/uL — ABNORMAL HIGH (ref 1.4–7.0)
Neutrophils: 75 %
Platelets: 223 x10E3/uL (ref 150–450)
RBC: 4.2 x10E6/uL (ref 3.77–5.28)
RDW: 12.4 % (ref 11.7–15.4)
WBC: 12.1 x10E3/uL — ABNORMAL HIGH (ref 3.4–10.8)

## 2020-01-01 LAB — COMPREHENSIVE METABOLIC PANEL
ALT: 16 IU/L (ref 0–32)
AST: 19 IU/L (ref 0–40)
Albumin/Globulin Ratio: 1.5 (ref 1.2–2.2)
Albumin: 3.8 g/dL (ref 3.6–4.6)
Alkaline Phosphatase: 122 IU/L — ABNORMAL HIGH (ref 44–121)
BUN/Creatinine Ratio: 26 (ref 12–28)
BUN: 22 mg/dL (ref 8–27)
Bilirubin Total: 0.8 mg/dL (ref 0.0–1.2)
CO2: 26 mmol/L (ref 20–29)
Calcium: 9.2 mg/dL (ref 8.7–10.3)
Chloride: 101 mmol/L (ref 96–106)
Creatinine, Ser: 0.85 mg/dL (ref 0.57–1.00)
GFR calc Af Amer: 73 mL/min/{1.73_m2} (ref >59)
GFR calc non Af Amer: 63 mL/min/{1.73_m2} (ref >59)
Globulin, Total: 2.6 g/dL (ref 1.5–4.5)
Glucose: 198 mg/dL — ABNORMAL HIGH (ref 65–99)
Potassium: 5.3 mmol/L — ABNORMAL HIGH (ref 3.5–5.2)
Sodium: 139 mmol/L (ref 134–144)
Total Protein: 6.4 g/dL (ref 6.0–8.5)

## 2020-01-01 LAB — HEMOGLOBIN A1C
Est. average glucose Bld gHb Est-mCnc: 189 mg/dL
Hgb A1c MFr Bld: 8.2 % — ABNORMAL HIGH (ref 4.8–5.6)

## 2020-01-01 LAB — PROTIME-INR
INR: 1.8 — ABNORMAL HIGH (ref 0.9–1.2)
Prothrombin Time: 18.8 s — ABNORMAL HIGH (ref 9.1–12.0)

## 2020-01-01 LAB — TSH: TSH: <0.005 u[IU]/mL — ABNORMAL LOW (ref 0.450–4.500)

## 2020-01-01 LAB — BRAIN NATRIURETIC PEPTIDE: BNP: 133 pg/mL — ABNORMAL HIGH (ref 0.0–100.0)

## 2020-01-01 MED ORDER — LEVOTHYROXINE SODIUM 112 MCG PO TABS: 112.0000 ug | ORAL_TABLET | Freq: Every day | ORAL | 1 refills | Status: DC

## 2020-01-01 NOTE — Progress Notes (Signed)
Meds ordered this encounter  Medications  . levothyroxine (SYNTHROID) 112 MCG tablet    Sig: Take 1 tablet (112 mcg total) by mouth daily.    Dispense:  90 tablet    Refill:  1   Medications Discontinued During This Encounter  Medication Reason  . levothyroxine (SYNTHROID) 125 MCG tablet Dose change

## 2020-01-01 NOTE — Telephone Encounter (Signed)
Anderson Malta, [place] Technologist called the right leg ultrasound report done today, negative for DVT.

## 2020-01-01 NOTE — Progress Notes (Signed)
Negative for DVT in right lower extremity.

## 2020-01-01 NOTE — Progress Notes (Signed)
Verify how she is taking her Warfarin and if she has missed any doses? INR slightly low, if still taking Warfarin 3 mg and 4 mg alternating days, ok to do Warfarin 4 mg on one of the 3 mg days (once and not taking the 3 mg tablet) and then go back to regular dose. INR goal 2-4  Recheck INR in 2 weeks needs appointment.   WBC mild elevation and neutrophils, has wound was started on Augmentin on 12/31/19 office visit.   BNP is still pending at this time.  A1C is elevated from last visit, increase exercise, taking medications as ordered daily and diet control advised. Please place referral to endocrinology.

## 2020-01-01 NOTE — Telephone Encounter (Signed)
Noted  

## 2020-01-02 ENCOUNTER — Ambulatory Visit
Admission: RE | Admit: 2020-01-02 | Discharge: 2020-01-02 | Disposition: A | Discharge status: Home / Self Care | Payer: Medicare Other | Source: Ambulatory Visit | Attending: Adult Health | Admitting: Adult Health

## 2020-01-02 ENCOUNTER — Encounter: Payer: Self-pay | Admitting: Adult Health

## 2020-01-02 ENCOUNTER — Ambulatory Visit: Payer: Medicare Other

## 2020-01-02 ENCOUNTER — Ambulatory Visit (INDEPENDENT_AMBULATORY_CARE_PROVIDER_SITE_OTHER): Payer: Medicare Other | Admitting: Adult Health

## 2020-01-02 ENCOUNTER — Ambulatory Visit
Admission: RE | Admit: 2020-01-02 | Discharge: 2020-01-02 | Disposition: A | Discharge status: Home / Self Care | Payer: Medicare Other | Attending: Adult Health | Admitting: Adult Health

## 2020-01-02 VITALS — BP 140/60 | HR 93 | Temp 98.0°F | Resp 18 | Wt 230.0 lb

## 2020-01-02 DIAGNOSIS — L03115 Cellulitis of right lower limb: Secondary | ICD-10-CM

## 2020-01-02 DIAGNOSIS — I872 Venous insufficiency (chronic) (peripheral): Secondary | ICD-10-CM

## 2020-01-02 DIAGNOSIS — L0390 Cellulitis, unspecified: Secondary | ICD-10-CM | POA: Diagnosis not present

## 2020-01-02 DIAGNOSIS — R0989 Other specified symptoms and signs involving the circulatory and respiratory systems: Secondary | ICD-10-CM | POA: Diagnosis not present

## 2020-01-02 DIAGNOSIS — R7989 Other specified abnormal findings of blood chemistry: Secondary | ICD-10-CM

## 2020-01-02 DIAGNOSIS — R0602 Shortness of breath: Secondary | ICD-10-CM

## 2020-01-02 DIAGNOSIS — R600 Localized edema: Secondary | ICD-10-CM

## 2020-01-02 NOTE — Progress Notes (Signed)
Established patient visit   Patient: Tracy Mann   DOB: 07/30/1933   86 y.o. Female  MRN: [id number] Visit Date: 01/02/2020  Today's healthcare provider: Marcille Buffy, FNP   Chief Complaint  Patient presents with   Follow-up   Subjective    HPI  Follow up for cellulitis right lower leg  The patient was last seen for this 2 days ago. Changes made at last visit include referral made to wound clinic, Venous US ordered and labs. Patient was started on Augmentin 875-125mg  Ultrasound negative for DVT right leg.  She reports excellent compliance with treatment. She feels that condition is Improved. She is not having side effects. Tolerating Augmentin well, wound is no longer bleeding on right lower leg. CBC did show elevated wbc and neutrophils.   She has more swelling today in lower extremities and does endorse " slightly worsening shortness of breath". She has been taking Demadex 20 mg once daily. Weight is up 3 lbs from visit 3 days ago.   Denies any falls or trauma.  Has been referred  to wound clinic, wait times are long, will consider dermatology.   Patient  denies any fever, chills, rash, chest pain, nausea, vomiting, or diarrhea.  Denies dizziness, lightheadedness, pre syncopal or syncopal episodes.    -----------------------------------------------------------------------------------------  Patient Active Problem List   Diagnosis Date Noted   Bilateral lower extremity edema 01/02/2020   Wound cellulitis 01/02/2020   Cellulitis of right lower leg 01/02/2020   Elevated brain natriuretic peptide (BNP) level 01/02/2020   Chronic hip pain (2ry area of Pain) (Bilateral) (R>L) 12/09/2019   Osteoarthritis involving multiple joints 12/09/2019   Osteoarthritis of knee (Right) 12/09/2019   Osteoarthritis of facet joint of lumbar spine 12/09/2019   Osteoarthritis of lumbar spine 12/09/2019   Pharmacologic therapy 12/08/2019   Lumbosacral facet  syndrome 12/08/2019   Lumbar lateral recess stenosis (L4-5) (Right) 11/26/2019   Chronic low back pain (Bilateral) w/ sciatica (Bilateral) 11/26/2019   Lumbosacral radiculopathy at L5 (Bilateral) 11/26/2019   Chronic lower extremity pain (1ry area of Pain) (Bilateral) 11/26/2019   Hypoglycemia associated with diabetes ([place]) 11/26/2019   History of multiple allergies 11/25/2019   Chronic pain syndrome 11/25/2019   Disorder of skeletal system 11/25/2019   Problems influencing health status 11/25/2019   Abnormal MRI, lumbar spine (08/22/2019) 11/25/2019   Lumbar facet hypertrophy (Multilevel) (Bilateral) 11/25/2019   Lumbar central spinal stenosis (SEVERE) (L3-4) with neurogenic claudication 11/25/2019   Grade 1 Anterolisthesis of lumbar spine (L4/L5) 11/25/2019   Lumbar facet arthropathy (L3-4 right-sided facet edema/joint effusion) 11/25/2019   Chronic venous insufficiency 07/27/2019   Statin intolerance 06/29/2019   Lymphedema 06/25/2019   Vitamin D deficiency 05/21/2019   Angina pectoris ([place]) 06/07/2015   Angina at rest [facility]) 05/20/2015   Fatigue 03/03/2015   Mixed Ischemic/Nonischemic Cardiomyopathy    Chronic atrial fibrillation (HCC)    Chronic anticoagulation (Coumadin)    Airway hyperreactivity 07/08/2014   Cervical muscle strain 07/08/2014   PNA (pneumonia) 07/08/2014   Chest pressure 07/08/2014   Clinical depression 07/08/2014   DDD (degenerative disc disease), lumbosacral 07/08/2014   Accumulation of fluid in tissues 07/08/2014   Anxiety, generalized 07/08/2014   Acid reflux 98/92/1194   Folliculitis 17/40/8144   Broken leg 07/08/2014   Cannot sleep 07/08/2014   Obstructive sleep apnea 07/08/2014   Onychia of finger 07/08/2014   Psoriasis 07/08/2014   Restless legs syndrome 07/08/2014   Adult BMI 30+ 07/08/2014   Hypothyroidism 09/04/2013  Type 2 diabetes mellitus without complications ([place]) 08/65/7846   Calculus of  kidney 10/31/2011   Bladder infection, chronic 10/31/2011   Incomplete bladder emptying 10/31/2011   Obesity 11/02/2010   Shortness of breath 02/01/2010   Chronic diastolic heart failure ([place]) 02/08/2009   Hyperlipidemia 09/16/2008   CAD, NATIVE VESSEL 09/16/2008   Atrial fibrillation ([place]) 09/16/2008   Benign hypertensive heart disease without congestive heart failure 09/16/2008   Past Medical History:  Diagnosis Date   Acute myocardial infarction of inferior wall ([place])    a. 10/2005 - Presumed to be 2/2 to a coronary embolus in setting of persistent Afib-->Cath 2014 relatively nl cors, EF 45-50% w/ severe distal inferior/apical inferior HK w/ aneursymal appearance.   Arthritis    Chronic anticoagulation    a. warfarin.   Chronic atrial fibrillation (HCC)    a. CHA2DS2VASc = 6--> warfarin.   Chronic combined systolic and diastolic CHF (congestive heart failure) ([place])    a. 10/2012 Echo: EF 35-40%, diff HK, mild MR, mild biatrial enlargement;  b. 11/2012 EF 45-50% by LV gram; c. echo 07/2014: EF 40-45%, mod conc LVH, mod MR, severe biatrial enlargement, PASP 45 mm Hg c. Echo 01/2018 EF 45-50%, duffuse hypokinesis, mild MR, LA mod dilated, norm RVSF, dilated IVC   Essential hypertension    Hyperlipidemia, mixed    Mixed Ischemic/Nonischemic Cardiomyopathy    a. 10/2012 Echo: EF 35-40%;  b. 11/2012 EF 45-50% by LV gram.   Nonobstructive coronary artery disease    a. 2007 inferior wall MI thought to be secondary to thrombus from atrial fib b. 2014 cath with R dominant coronary arterial system with mild luminal irregularities c. Myoview 05/2015 old inferior MI, no new ischemic changes   Obesity    Thyroid disease    hypothyroidism   Type II diabetes mellitus ([place])    a. 07/2018 A1c 8.7 - long term insulin use   Venous insufficiency    Vitamin D deficiency    Past Surgical History:  Procedure Laterality Date   CARDIAC CATHETERIZATION  11/2012   [facility];EF 45-50%    CHOLECYSTECTOMY  1999   TUBAL LIGATION  1968   VESICOVAGINAL FISTULA CLOSURE W/ TAH  1996   Social History   Tobacco Use   Smoking status: Never Smoker   Smokeless tobacco: Never Used  Vaping Use   Vaping Use: Never used  Substance Use Topics   Alcohol use: No   Drug use: No   Social History   Socioeconomic History   Marital status: Widowed    Spouse name: widowed   Number of children: 4   Years of education: 14   Highest education level: 12th grade  Occupational History   Occupation: retired  Tobacco Use   Smoking status: Never Smoker   Smokeless tobacco: Never Used  Scientific laboratory technician Use: Never used  Substance and Sexual Activity   Alcohol use: No   Drug use: No   Sexual activity: Never  Other Topics Concern   Not on file  Social History Narrative   Widowed   Has children and grandchildren   Lives in [place] Strain:    Difficulty of Paying Living Expenses: Not on file  Food Insecurity:    Worried About Charity fundraiser in the Last Year: Not on file   [place] in the Last Year: Not on file  Transportation Needs:    Lack of Transportation (Medical):  Not on file   Lack of Transportation (Non-Medical): Not on file  Physical Activity:    Days of Exercise per Week: Not on file   Minutes of Exercise per Session: Not on file  Stress:    Feeling of Stress : Not on file  Social Connections:    Frequency of Communication with Friends and Family: Not on file   Frequency of Social Gatherings with Friends and Family: Not on file   Attends Religious Services: Not on file   Active Member of Clubs or Organizations: Not on file   Attends Archivist Meetings: Not on file   Marital Status: Not on file  Intimate Partner Violence:    Fear of Current or Ex-Partner: Not on file   Emotionally Abused: Not on file   Physically Abused: Not on file   Sexually  Abused: Not on file   Family Status  Relation Name Status   Mother  Deceased at age 64       Died of probable heart problems   Father  Deceased       due to MVA in his 69s   Sister  [place]   Daughter  Alive   Son  Alive   Brother  Alive   Brother  Alive   Son  Alive   Son  Alive   Family History  Problem Relation Age of Onset   Heart disease Mother    Thyroid disease Father    Allergies  Allergen Reactions   Duloxetine Other (See Comments)    Same as Paroxetine   Other Anaphylaxis and Swelling    'tongue swelling'   Paroxetine Other (See Comments)    "Horrible headache," Stomach pain, Diarrhea   Sulfa Antibiotics Anaphylaxis   Cephalexin     Blisters   Clarithromycin Other (See Comments)    Hives, headaches, hard time swelling, felt like throat was closing up Hives, headaches, hard time swelling, felt like throat was closing up   Diphenhydramine Other (See Comments)   Estrogens Conjugated    Estrogens, Conjugated Other (See Comments)   Sulfonamide Derivatives Swelling    'tongue swelling'   Nitrofurantoin Nausea Only       Medications: Outpatient Medications Prior to Visit  Medication Sig   ACCU-CHEK AVIVA PLUS test strip USE WITH METER TO CHECK GLUCOSE BEFORE MEALS AND AT BEDTIME   amoxicillin-clavulanate (AUGMENTIN) 875-125 MG tablet Take 1 tablet by mouth 2 (two) times daily.   estradiol (ESTRACE) 0.1 MG/GM vaginal cream Place 1 Applicatorful vaginally at bedtime.   ezetimibe (ZETIA) 10 MG tablet TAKE 1 TABLET BY MOUTH EVERY DAY   flavoxATE (URISPAS) 100 MG tablet Take 1 tablet (100 mg total) by mouth 2 (two) times daily as needed for bladder spasms.   fluticasone (FLONASE) 50 MCG/ACT nasal spray SPRAY TWO SPRAYS IN EACH NOSTRIL ONCE DAILY   HYDROcodone-acetaminophen (NORCO) 10-325 MG tablet Take 1 tablet by mouth every 6 (six) hours as needed for severe pain. Must last 30 days.   insulin glargine (LANTUS  SOLOSTAR) 100 UNIT/ML Solostar Pen Inject 20 units nightly between 8-10 PM. Adjust as directed. Max dose is 50 units daily.   insulin lispro (HUMALOG KWIKPEN) 100 UNIT/ML KwikPen INJECT 10-20 UNITS THREE TIMES DAILY WITH EACH MEAL.   Insulin Pen Needle (RELION PEN NEEDLE 31G/8MM) 31G X 8 MM MISC Use with insulin pen three times daily   levothyroxine (SYNTHROID) 112 MCG tablet Take 1 tablet (112 mcg total) by mouth daily.  lisinopril (ZESTRIL) 2.5 MG tablet TAKE 1 TABLET BY MOUTH EVERYDAY AT BEDTIME   LORazepam (ATIVAN) 1 MG tablet TAKE 1 TABLET (1 MG TOTAL) BY MOUTH 2 (TWO) TIMES DAILY AS NEEDED.   Magnesium 500 MG CAPS Take 1,000 mg by mouth daily.    metolazone (ZAROXOLYN) 2.5 MG tablet Take 1 tablet (2.5 mg total) by mouth 2 (two) times a week. TAKE 30 minutes before Torsemide   nitroGLYCERIN (NITROSTAT) 0.4 MG SL tablet Place 1 tablet (0.4 mg total) under the tongue every 5 (five) minutes as needed for chest pain.   NONFORMULARY OR COMPOUNDED ITEM Diclofenac/Baclofen/Bu/Gabapentin Compounded Cream   NONFORMULARY OR COMPOUNDED ITEM See pharmacy note- Not to use on any open skin or wound.   phenazopyridine (PYRIDIUM) 200 MG tablet Take 1 tablet (200 mg total) by mouth 3 (three) times daily as needed for pain.   potassium chloride SA (KLOR-CON) 20 MEQ tablet Take 1-2 tablets with metolazone   torsemide (DEMADEX) 20 MG tablet Take 1 tablet (20 mg total) by mouth daily.   Vitamin D, Ergocalciferol, (DRISDOL) 50000 units CAPS capsule Take 50,000 Units by mouth. Going to restart   warfarin (COUMADIN) 4 MG tablet TAKE 1 TABLET BY MOUTH EVERY DAY (Patient taking differently: Pt alter dose everyday. She takes coumadin 4mg  and 3 mg QOD)   No facility-administered medications prior to visit.    Review of Systems  Constitutional: Positive for fatigue. Negative for activity change, appetite change, chills, diaphoresis, fever and unexpected weight change.  Respiratory: Positive for  shortness of breath. Negative for apnea, cough, choking, chest tightness, wheezing and stridor.   Cardiovascular: Positive for leg swelling. Negative for chest pain and palpitations.  Skin: Positive for color change and wound. Negative for pallor and rash.  Neurological: Negative for dizziness, speech difficulty, light-headedness and headaches.  Psychiatric/Behavioral: Negative.     Last CBC Lab Results  Component Value Date   WBC 12.1 (H) 12/31/2019   HGB 12.7 12/31/2019   HCT 37.1 12/31/2019   MCV 88 12/31/2019   MCH 30.2 12/31/2019   RDW 12.4 12/31/2019   PLT 223 39/76/7341   Last metabolic panel Lab Results  Component Value Date   GLUCOSE 198 (H) 12/31/2019   NA 139 12/31/2019   K 5.3 (H) 12/31/2019   CL 101 12/31/2019   CO2 26 12/31/2019   BUN 22 12/31/2019   CREATININE 0.85 12/31/2019   GFRNONAA 63 12/31/2019   GFRAA 73 12/31/2019   CALCIUM 9.2 12/31/2019   PHOS 3.9 09/08/2014   PROT 6.4 12/31/2019   ALBUMIN 3.8 12/31/2019   LABGLOB 2.6 12/31/2019   AGRATIO 1.5 12/31/2019   BILITOT 0.8 12/31/2019   ALKPHOS 122 (H) 12/31/2019   AST 19 12/31/2019   ALT 16 12/31/2019   ANIONGAP 10 02/13/2018   Last lipids Lab Results  Component Value Date   CHOL 172 01/17/2019   HDL 57 01/17/2019   LDLCALC 94 01/17/2019   TRIG 119 01/17/2019   CHOLHDL 3.0 01/17/2019   Last hemoglobin A1c Lab Results  Component Value Date   HGBA1C 8.2 (H) 12/31/2019   Last thyroid functions Lab Results  Component Value Date   TSH <0.005 (L) 12/31/2019   T4TOTAL 8.2 11/26/2007   Last vitamin D Lab Results  Component Value Date   25OHVITD2 9.2 11/26/2019   25OHVITD3 34 11/26/2019   Last vitamin B12 and Folate Lab Results  Component Value Date   VITAMINB12 360 11/26/2019   BNP (last 3 results) Recent Labs  12/31/19 1335  BNP 133.0*    ProBNP (last 3 results) No results for input(s): PROBNP in the last 8760 hours.     Objective    BP 140/60    Pulse 93    Temp 98  F (36.7 C) (Oral)    Resp 18    Wt 230 lb (104.3 kg)    SpO2 98%    BMI 37.69 kg/m  Wt Readings from Last 3 Encounters:  01/02/20 230 lb (104.3 kg)  12/31/19 227 lb (103 kg)  12/16/19 222 lb (100.7 kg)      Physical Exam Constitutional:      Appearance: She is obese. She is not diaphoretic.  HENT:     Head: Normocephalic and atraumatic.     Right Ear: External ear normal.     Left Ear: External ear normal.  Cardiovascular:     Rate and Rhythm: Normal rate and regular rhythm.     Pulses: Normal pulses.     Heart sounds: Normal heart sounds.  Pulmonary:     Effort: Pulmonary effort is normal. No respiratory distress.     Breath sounds: Normal breath sounds. No stridor. No wheezing, rhonchi or rales.  Chest:     Chest wall: No tenderness.  Abdominal:     General: There is no distension.     Palpations: Abdomen is soft.     Tenderness: There is no abdominal tenderness.  Musculoskeletal:        General: Normal range of motion.     Cervical back: Normal range of motion and neck supple.     Right lower leg: Edema (bilateral 2 plus, left lower extremity with scant weeping. ) present.     Left lower leg: Edema present.  Skin:    General: Skin is warm.     Findings: Abrasion, erythema and wound present.          Comments: Lower extremity peripheral vascular insufficiency]  1+ bilateral posterior tibial pulses.   No bleeding in wound today.   Neurological:     Mental Status: She is alert and oriented to person, place, and time.     Motor: No weakness.     Gait: Gait normal.  Psychiatric:        Mood and Affect: Mood normal.        Behavior: Behavior normal.        Thought Content: Thought content normal.        Judgment: Judgment normal.      No results found for any visits on 01/02/20.  Assessment & Plan     Shortness of breath  Elevated brain natriuretic peptide (BNP) level - Plan: DG Chest 2 View  Chronic venous insufficiency  Cellulitis of right lower leg  - Plan: Ambulatory referral to Dermatology  Wound cellulitis - Plan: Ambulatory referral to Dermatology  Bilateral lower extremity edema  Wound care instructions were given.  Provider changed bandage, cleaned wound with cleanising towelette and saline, Vaseline gauze applied with non stick Telfa covered by 4 x 4 gauze with coban covering.   Take Torsemide 40 mg by mouth once daily for three days. Then return to normal dosage of Torsemide  20 mg per day. Monitor weights and report any hypotension.   Chest x ray today.  Recheck CBC and PT/ INR in office next week.   Red Flags discussed. The patient was given clear instructions to go to ER or return to medical center if any red flags develop, symptoms do  not improve, worsen or new problems develop. They verbalized understanding.  Patient verbalized understanding of all instructions given and denies any further questions at this time.    Return in about 1 week (around 01/09/2020), or if symptoms worsen or fail to improve, for at any time for any worsening symptoms, Go to Emergency room/ urgent care if worse.      I spent 40 minutes  dedicated to the care of this patient on  the date of this encounter to include pre-visit review of records, face-to-face time with the  patient discussing The primary encounter diagnosis was Shortness of breath. Diagnoses of Elevated brain natriuretic peptide (BNP) level, Chronic venous insufficiency, Cellulitis of right lower leg, Wound cellulitis, and Bilateral lower extremity edema were also pertinent to this visit. and post visit ordering of testing.   Marcille Buffy, [place] [phone] (phone) [phone] (fax)  [place]

## 2020-01-02 NOTE — Patient Instructions (Addendum)
Take Torsemide 40 mg by mouth once daily for three days. Then return to normal dosage of Torsemide  20 mg per day.   Chest x ray today.   Cellulitis, Adult  Cellulitis is a skin infection. The infected area is often warm, red, swollen, and sore. It occurs most often in the arms and lower legs. It is very important to get treated for this condition. What are the causes? This condition is caused by bacteria. The bacteria enter through a break in the skin, such as a cut, burn, insect bite, open sore, or crack. What increases the risk? This condition is more likely to occur in people who:  Have a weak body defense system (immune system).  Have open cuts, burns, bites, or scrapes on the skin.  Are older than 84 years of age.  Have a blood sugar problem (diabetes).  Have a long-lasting (chronic) liver disease (cirrhosis) or kidney disease.  Are very overweight (obese).  Have a skin problem, such as: ? Itchy rash (eczema). ? Slow movement of blood in the veins (venous stasis). ? Fluid buildup below the skin (edema).  Have been treated with high-energy rays (radiation).  Use IV drugs. What are the signs or symptoms? Symptoms of this condition include:  Skin that is: ? Red. ? Streaking. ? Spotting. ? Swollen. ? Sore or painful when you touch it. ? Warm.  A fever.  Chills.  Blisters. How is this diagnosed? This condition is diagnosed based on:  Medical history.  Physical exam.  Blood tests.  Imaging tests. How is this treated? Treatment for this condition may include:  Medicines to treat infections or allergies.  Home care, such as: ? Rest. ? Placing cold or warm cloths (compresses) on the skin.  Hospital care, if the condition is very bad. Follow these instructions at home: Medicines  Take over-the-counter and prescription medicines only as told by your doctor.  If you were prescribed an antibiotic medicine, take it as told by your doctor. Do not stop  taking it even if you start to feel better. General instructions   Drink enough fluid to keep your pee (urine) pale yellow.  Do not touch or rub the infected area.  Raise (elevate) the infected area above the level of your heart while you are sitting or lying down.  Place cold or warm cloths on the area as told by your doctor.  Keep all follow-up visits as told by your doctor. This is important. Contact a doctor if:  You have a fever.  You do not start to get better after 1-2 days of treatment.  Your bone or joint under the infected area starts to hurt after the skin has healed.  Your infection comes back. This can happen in the same area or another area.  You have a swollen bump in the area.  You have new symptoms.  You feel ill and have muscle aches and pains. Get help right away if:  Your symptoms get worse.  You feel very sleepy.  You throw up (vomit) or have watery poop (diarrhea) for a long time.  You see red streaks coming from the area.  Your red area gets larger.  Your red area turns dark in color. These symptoms may represent a serious problem that is an emergency. Do not wait to see if the symptoms will go away. Get medical help right away. Call your local emergency services (911 in the [place] Do not drive yourself to the hospital. Summary  Cellulitis  is a skin infection. The area is often warm, red, swollen, and sore.  This condition is treated with medicines, rest, and cold and warm cloths.  Take all medicines only as told by your doctor.  Tell your doctor if symptoms do not start to get better after 1-2 days of treatment. This information is not intended to replace advice given to you by your health care provider. Make sure you discuss any questions you have with your health care provider. Document Revised: 06/07/2017 Document Reviewed: 06/07/2017 Elsevier Patient Education  [place].

## 2020-01-04 NOTE — Progress Notes (Signed)
Chronic vascular congestion still seen. Treatment the same as last office visit and follow up as advised and if any shortness of breath worsens at anytime.

## 2020-01-04 NOTE — Progress Notes (Signed)
PROVIDER NOTE: Information contained herein reflects review and annotations entered in association with encounter. Interpretation of such information and data should be left to medically-trained personnel. Information provided to patient can be located elsewhere in the medical record under "Patient Instructions". Document created using STT-dictation technology, any transcriptional errors that may result from process are unintentional.    Patient: Tracy Mann  Service Category: E/M  Provider: Gaspar Cola, MD  DOB: 22-May-1933  DOS: 01/05/2020  Specialty: Interventional Pain Management  MRN: [id number]  Setting: Ambulatory outpatient  PCP: Jerrol Banana., MD  Type: Established Patient    Referring Provider: Jerrol Banana.,*  Location: Office  Delivery: Face-to-face     HPI  Tracy Mann, a 86 y.o. year old female, is here today because of her Chronic pain syndrome [G89.4]. Tracy Mann primary complain today is Hip Pain Last encounter: My last encounter with her was on 12/31/2019. Pertinent problems: Tracy Mann has Cervical muscle strain; DDD (degenerative disc disease), lumbosacral; Chronic venous insufficiency; Chronic pain syndrome; Abnormal MRI, lumbar spine (08/22/2019); Lumbar facet hypertrophy (Multilevel) (Bilateral); Lumbar central spinal stenosis (SEVERE) (L3-4) with neurogenic claudication; Grade 1 Anterolisthesis of lumbar spine (L4/L5); Lumbar facet arthropathy (L3-4 right-sided facet edema/joint effusion); Lumbar lateral recess stenosis (L4-5) (Right); Chronic low back pain (3ry area of Pain) (Bilateral) w/ sciatica (Bilateral); Lumbosacral radiculopathy at L5 (Bilateral); Chronic lower extremity pain (1ry area of Pain) (Bilateral); Lumbosacral facet syndrome; Chronic hip pain (2ry area of Pain) (Bilateral) (R>L); Osteoarthritis involving multiple joints; Osteoarthritis of knee (Right); Osteoarthritis of facet joint of lumbar spine; Osteoarthritis of lumbar spine; and  Chronic knee pain (Bilateral) on their pertinent problem list. Pain Assessment: Severity of Chronic pain is reported as a 4 /10. Location: Hip Right,Left/pain radiaities down both leg to her feet. Onset: More than a month ago. Quality: Aching,Burning,Throbbing. Timing: Constant. Modifying factor(s): meds and heating pad. Vitals:  height is _0  (1.676 m) and weight is 218 lb (98.9 kg). Her temperature is 97.2 F (36.2 C) (abnormal). Her blood pressure is 110/89 and her pulse is 89. Her oxygen saturation is 100%.   Reason for encounter: medication management.  The patient was placed on the schedule today for medication management, she was supposed to come in tomorrow for procedure (w/ sedation): (R) L3-4 LESI #1 + (R) L4 TFESI #1, however this was canceled because she has an open wound on her right leg and she appears to be having cellulitis on that leg.  We will put the procedure on hold until she is over this particular event.  The patient indicates doing well with the current medication regimen. No adverse reactions or side effects reported to the medications. PMP & UDS compliant.  Today I will provide her with 2 prescriptions for hydrocodone.  She should have enough to last until 03/10/2020.  I will have her return before 03/10/2020 for face-to-face medication management evaluation and to see how she is doing regarding the right lower extremity cellulitis.  Pharmacotherapy Assessment   Analgesic: Hydrocodone/APAP 10/325, 1 tab p.o. 4 times daily (last filled on 11/12/2019) MME/day: 40 mg/day   Monitoring: [place] PMP: PDMP reviewed during this encounter.       Pharmacotherapy: No side-effects or adverse reactions reported. Compliance: No problems identified. Effectiveness: Clinically acceptable.  Chauncey Fischer, RN  01/05/2020  1:17 PM  Sign when Signing Visit Nursing Pain Medication Assessment:  Safety precautions to be maintained throughout the outpatient stay will include: orient to surroundings,  keep  bed in low position, maintain call bell within reach at all times, provide assistance with transfer out of bed and ambulation.  Medication Inspection Compliance: Pill count conducted under aseptic conditions, in front of the patient. Neither the pills nor the bottle was removed from the patient's sight at any time. Once count was completed pills were immediately returned to the patient in their original bottle.  Medication: Hydrocodone/APAP Pill/Patch Count: 26 of 120 pills remain Pill/Patch Appearance: Markings consistent with prescribed medication Bottle Appearance: Standard pharmacy container. Clearly labeled. Filled Date: 24 / 62 / 21 Last Medication intake:  Today    UDS:  Summary  Date Value Ref Range Status  03/10/2020 Note  Final    Comment:    ==================================================================== [employer] [facility] (MW) ==================================================================== Test                             Result       Flag       Units  Drug Present   Lorazepam                      >1205                   ng/mg creat    Source of lorazepam is a scheduled prescription medication.    Hydrocodone                    1742                    ng/mg creat   Hydromorphone                  209                     ng/mg creat   Dihydrocodeine                 576                     ng/mg creat   Norhydrocodone                 >3012                   ng/mg creat    Sources of hydrocodone include scheduled prescription medications.    Hydromorphone, dihydrocodeine and norhydrocodone are expected    metabolites of hydrocodone. Hydromorphone and dihydrocodeine are    also available as scheduled prescription medications.  ==================================================================== Test                      Result    Flag   Units      Ref Range   Creatinine              166              mg/dL       >=20 ==================================================================== Declared Medications:  Medication list was not provided. ==================================================================== For clinical consultation, please call [phone]. ====================================================================      ROS  Constitutional: Denies any fever or chills Gastrointestinal: No reported hemesis, hematochezia, vomiting, or acute GI distress Musculoskeletal: Denies any acute onset joint swelling, redness, loss of ROM, or weakness Neurological: No reported episodes of acute onset apraxia, aphasia, dysarthria, agnosia, amnesia, paralysis, loss of coordination, or loss of consciousness  Medication Review  HYDROcodone-acetaminophen, Insulin  Pen Needle, LORazepam, Magnesium, NONFORMULARY OR COMPOUNDED ITEM, albuterol, estradiol, ezetimibe, flavoxATE, fluticasone, glucose blood, insulin glargine, insulin lispro, levofloxacin, levothyroxine, lisinopril, metolazone, mupirocin ointment, nitroGLYCERIN, ondansetron, phenazopyridine, potassium chloride SA, rOPINIRole, torsemide, and warfarin  History Review  Allergy: Tracy Mann is allergic to duloxetine; other; paroxetine; sulfa antibiotics; cephalexin; clarithromycin; diphenhydramine; estrogens conjugated; estrogens, conjugated; sulfonamide derivatives; and nitrofurantoin. Drug: Tracy Mann  reports no history of drug use. Alcohol:  reports no history of alcohol use. Tobacco:  reports that she has never smoked. She has never used smokeless tobacco. Social: Tracy Mann  reports that she has never smoked. She has never used smokeless tobacco. She reports that she does not drink alcohol and does not use drugs. Medical:  has a past medical history of Acute myocardial infarction of inferior wall ([place]), Arthritis, Chronic anticoagulation, Chronic atrial fibrillation ([place]), Chronic combined systolic and diastolic CHF (congestive heart failure) ([place]),  Essential hypertension, Hyperlipidemia, mixed, Mixed Ischemic/Nonischemic Cardiomyopathy, Nonobstructive coronary artery disease, Obesity, Thyroid disease, Type II diabetes mellitus ([place]), Venous insufficiency, and Vitamin D deficiency. Surgical: Tracy Mann  has a past surgical history that includes Vesicovaginal fistula closure w/ TAH (1996); Tubal ligation (1968); Cholecystectomy (1999); and Cardiac catheterization (11/2012). Family: family history includes Heart disease in her mother; Thyroid disease in her father.  Laboratory Chemistry Profile   Renal Lab Results  Component Value Date   BUN 36 (H) 01/09/2020   CREATININE 1.02 (H) 01/09/2020   BCR 26 12/31/2019   GFRAA 73 12/31/2019   GFRNONAA 54 (L) 01/09/2020     Hepatic Lab Results  Component Value Date   AST 26 01/09/2020   ALT 14 01/09/2020   ALBUMIN 3.4 (L) 01/09/2020   ALKPHOS 106 01/09/2020     Electrolytes Lab Results  Component Value Date   NA 139 01/09/2020   K 4.2 01/09/2020   CL 98 01/09/2020   CALCIUM 9.0 01/09/2020   MG 2.1 11/26/2019   PHOS 3.9 09/08/2014     Bone Lab Results  Component Value Date   25OHVITD1 43 11/26/2019   25OHVITD2 9.2 11/26/2019   25OHVITD3 34 11/26/2019     Inflammation (CRP: Acute Phase) (ESR: Chronic Phase) Lab Results  Component Value Date   CRP 4 11/26/2019   ESRSEDRATE 45 (H) 11/26/2019   LATICACIDVEN 1.4 01/09/2020       Note: Above Lab results reviewed.  Recent Imaging Review  DG HIP UNILAT W OR W/O PELVIS 2-3 VIEWS LEFT CLINICAL DATA:  Hip pain  EXAM: DG HIP (WITH OR WITHOUT PELVIS) 2-3V LEFT  COMPARISON:  None.  FINDINGS: There is no evidence of hip fracture or dislocation. There is no evidence of arthropathy or other focal bone abnormality.  IMPRESSION: Negative.  Electronically Signed   By: Ulyses Jarred M.D.   On: 03/11/2020 01:44 DG HIP UNILAT W OR W/O PELVIS 2-3 VIEWS RIGHT CLINICAL DATA:  Hip pain  EXAM: DG HIP (WITH OR WITHOUT PELVIS)  2-3V RIGHT  COMPARISON:  None.  FINDINGS: There is no evidence of hip fracture or dislocation. There is no evidence of arthropathy or other focal bone abnormality.  IMPRESSION: Negative.  Electronically Signed   By: Ulyses Jarred M.D.   On: 03/11/2020 01:40 DG Knee 1-2 Views Left CLINICAL DATA:  Right knee pain  EXAM: LEFT KNEE - 1-2 VIEW  COMPARISON:  None.  FINDINGS: No evidence of fracture, dislocation, or joint effusion. No evidence of arthropathy or other focal bone abnormality. Soft tissues are unremarkable.  IMPRESSION: Negative.  Electronically Signed  By: Ulyses Jarred M.D.   On: 03/11/2020 01:39 DG Knee 1-2 Views Right CLINICAL DATA:  Right knee pain  EXAM: RIGHT KNEE - 1-2 VIEW  COMPARISON:  None.  FINDINGS: Mild narrowing of the medial femorotibial joint space. No joint effusion. No fracture or dislocation.  IMPRESSION: Mild medial femorotibial osteoarthrosis.  Electronically Signed   By: Ulyses Jarred M.D.   On: 03/11/2020 01:39 Note: Reviewed        Physical Exam  General appearance: Well nourished, well developed, and well hydrated. In no apparent acute distress Mental status: Alert, oriented x 3 (person, place, & time)       Respiratory: No evidence of acute respiratory distress Eyes: PERLA Vitals: BP 110/89   Pulse 89   Temp (!) 97.2 F (36.2 C)   Ht _0  (1.676 m)   Wt 218 lb (98.9 kg)   SpO2 100%   BMI 35.19 kg/m  BMI: Estimated body mass index is 35.19 kg/m as calculated from the following:   Height as of this encounter: _1  (1.676 m).   Weight as of this encounter: 218 lb (98.9 kg). Ideal: Ideal body weight: 59.3 kg (130 lb 11.7 oz) Adjusted ideal body weight: 75.1 kg (165 lb 10.2 oz)  Assessment   Status Diagnosis  Persistent Unimproved Unimproved 1. Chronic pain syndrome   2. Chronic lower extremity pain (Bilateral)   3. Chronic hip pain (2ry area of Pain) (Bilateral) (R>L)   4. Chronic low back pain  (Bilateral) w/ sciatica (Bilateral)   5. Lumbar central spinal stenosis (SEVERE) (L3-4) with neurogenic claudication   6. Lumbar facet hypertrophy (Multilevel) (Bilateral)   7. Pharmacologic therapy   8. Chronic anticoagulation (Coumadin)   9. Grade 1 Anterolisthesis of lumbar spine (L4/L5)   10. Abnormal MRI, lumbar spine (08/22/2019)      Updated Problems: No problems updated.  Plan of Care  Problem-specific:  No problem-specific Assessment & Plan notes found for this encounter.  Tracy Mann has a current medication list which includes the following long-term medication(s): ezetimibe, insulin lispro, levothyroxine, metolazone, potassium chloride sa, torsemide, albuterol, fluticasone, hydrocodone-acetaminophen, hydrocodone-acetaminophen, [START ON 05/23/2020] hydrocodone-acetaminophen, lisinopril, nitroglycerin, warfarin, warfarin, and [DISCONTINUED] ropinirole.  Pharmacotherapy (Medications Ordered): Meds ordered this encounter  Medications  . DISCONTD: HYDROcodone-acetaminophen (NORCO) 10-325 MG tablet    Sig: Take 1 tablet by mouth every 6 (six) hours as needed for severe pain. Must last 30 days.    Dispense:  120 tablet    Refill:  0    Chronic Pain: STOP Act (Not applicable) Fill 1 day early if closed on refill date. Avoid benzodiazepines within 8 hours of opioids  . DISCONTD: HYDROcodone-acetaminophen (NORCO) 10-325 MG tablet    Sig: Take 1 tablet by mouth every 6 (six) hours as needed for severe pain. Must last 30 days.    Dispense:  120 tablet    Refill:  0    Chronic Pain: STOP Act (Not applicable) Fill 1 day early if closed on refill date. Avoid benzodiazepines within 8 hours of opioids   Orders:  No orders of the defined types were placed in this encounter.  Follow-up plan:   Return in 2 months (on 03/10/2020) for (F2F), (Med Mgmt).      Interventional management options: Planned, scheduled, and/or pending:    NOTE: COUMADIN Anticoagulation (Stop: 5 days   Restart: 2 hours) Diagnostic right L3-4 LESI #1  Diagnostic right L4 TFESI #1  The patient will also need to have x-rays of  both of her hips and knees done as well as x-rays of her cervical and thoracic spine since there are no official reports on any of the x-rays done at [facility].   Considering:   Diagnostic bilateral L4 TFESI x1 (Done 09/12/2019 - Dr. Alba Destine [[facility]]) Diagnostic midline caudal ESI #1 + diagnostic epidurogram  Diagnostic bilateral IA hip joint injection #1  Diagnostic right L3-4 LESI #1  Diagnostic right L4 TFESI #1  Diagnostic bilateral L3 transforaminal ESI #1  Diagnostic bilateral lumbar facet block #1    PRN Procedures:   None at this time    Recent Visits Date Type Provider Dept  03/10/20 Office Visit Milinda Pointer, MD [facility]  Showing recent visits within past 90 days and meeting all other requirements Future Appointments Date Type Provider Dept  06/21/20 Appointment Milinda Pointer, MD [facility]  Showing future appointments within next 90 days and meeting all other requirements  I discussed the assessment and treatment plan with the patient. The patient was provided an opportunity to ask questions and all were answered. The patient agreed with the plan and demonstrated an understanding of the instructions.  Patient advised to call back or seek an in-person evaluation if the symptoms or condition worsens.  Duration of encounter: 30 minutes.  Note by: Gaspar Cola, MD Date: 01/05/2020; Time: 5:43 AM

## 2020-01-05 ENCOUNTER — Encounter: Payer: Self-pay | Admitting: Pain Medicine

## 2020-01-05 ENCOUNTER — Other Ambulatory Visit: Payer: Self-pay

## 2020-01-05 ENCOUNTER — Ambulatory Visit: Payer: Medicare Other | Attending: Pain Medicine | Admitting: Pain Medicine

## 2020-01-05 VITALS — BP 110/89 | HR 89 | Temp 97.2°F | Ht 66.0 in | Wt 218.0 lb

## 2020-01-05 DIAGNOSIS — M79605 Pain in left leg: Secondary | ICD-10-CM | POA: Diagnosis not present

## 2020-01-05 DIAGNOSIS — G8929 Other chronic pain: Secondary | ICD-10-CM | POA: Diagnosis not present

## 2020-01-05 DIAGNOSIS — M25552 Pain in left hip: Secondary | ICD-10-CM | POA: Insufficient documentation

## 2020-01-05 DIAGNOSIS — M48062 Spinal stenosis, lumbar region with neurogenic claudication: Secondary | ICD-10-CM | POA: Diagnosis not present

## 2020-01-05 DIAGNOSIS — Z79899 Other long term (current) drug therapy: Secondary | ICD-10-CM | POA: Diagnosis not present

## 2020-01-05 DIAGNOSIS — R937 Abnormal findings on diagnostic imaging of other parts of musculoskeletal system: Secondary | ICD-10-CM | POA: Diagnosis not present

## 2020-01-05 DIAGNOSIS — M5441 Lumbago with sciatica, right side: Secondary | ICD-10-CM | POA: Insufficient documentation

## 2020-01-05 DIAGNOSIS — G894 Chronic pain syndrome: Secondary | ICD-10-CM | POA: Diagnosis not present

## 2020-01-05 DIAGNOSIS — M79604 Pain in right leg: Secondary | ICD-10-CM | POA: Diagnosis not present

## 2020-01-05 DIAGNOSIS — M47816 Spondylosis without myelopathy or radiculopathy, lumbar region: Secondary | ICD-10-CM | POA: Diagnosis not present

## 2020-01-05 DIAGNOSIS — M5442 Lumbago with sciatica, left side: Secondary | ICD-10-CM | POA: Insufficient documentation

## 2020-01-05 DIAGNOSIS — M25551 Pain in right hip: Secondary | ICD-10-CM

## 2020-01-05 DIAGNOSIS — Z7901 Long term (current) use of anticoagulants: Secondary | ICD-10-CM | POA: Diagnosis not present

## 2020-01-05 DIAGNOSIS — M4316 Spondylolisthesis, lumbar region: Secondary | ICD-10-CM

## 2020-01-05 MED ORDER — HYDROCODONE-ACETAMINOPHEN 10-325 MG PO TABS: 1.0000 | ORAL_TABLET | Freq: Four times a day (QID) | ORAL | 0 refills | Status: DC | PRN

## 2020-01-05 NOTE — Patient Instructions (Signed)
____________________________________________________________________________________________  Virtual Visits   Eligibility In order to be eligible for "Virtual Visit Encounters", you must be available over the phone. It is the patient's responsibility to make sure we have a way to contact you.  We understand how people are reluctant to pickup on "unknown" calls, therefore, we suggest adding our telephone numbers to your list of "CONTACT(s)". This way, you should be able to readily identify our calls when you receive them.  When will this type of visits be used? The decision will be made on a case by case basis.  At what time will I be called? This is an excellent question. The providers will try to call you during the day, whenever they have spare time. For the purpose of the day's schedule, you are assigned a time for your appointment, however, the call may come anytime during the day. We need you to be available on a moment's notice. If you are unable to do this, then request an "in-person" appointment rather than a "virtual visit".  Can I request my medication visits to be "Virtual"? Yes you may request it, but the decision is entirely up to the healthcare provider. Control substances require specific monitoring that requires Face-to-Face encounters. The number of encounters  and the extent of the monitoring is determined on a case by case basis.  Add a new contact to your smart phone and label it "PAIN CLINIC" Under this contact add the following numbers: Main: [phone] (Official Contact Number) Nurses: [phone] (These are outgoing only calling systems. Do not call this number.) Dr. Dossie Arbour: [phone] or [phone] (Outgoing calls only. Do not call this number.)  ____________________________________________________________________________________________   ____________________________________________________________________________________________  Preparing for  Procedure with Sedation  Procedure appointments are limited to planned procedures: . No Prescription Refills. . No disability issues will be discussed. . No medication changes will be discussed.  Instructions: . Oral Intake: Do not eat or drink anything for at least 8 hours prior to your procedure. (Exception: Blood Pressure Medication. See below.) . Transportation: Unless otherwise stated by your physician, you may drive yourself after the procedure. . Blood Pressure Medicine: Do not forget to take your blood pressure medicine with a sip of water the morning of the procedure. If your Diastolic (lower reading)is above 100 mmHg, elective cases will be cancelled/rescheduled. . Blood thinners: These will need to be stopped for procedures. Notify our staff if you are taking any blood thinners. Depending on which one you take, there will be specific instructions on how and when to stop it. . Diabetics on insulin: Notify the staff so that you can be scheduled 1st case in the morning. If your diabetes requires high dose insulin, take only  of your normal insulin dose the morning of the procedure and notify the staff that you have done so. . Preventing infections: Shower with an antibacterial soap the morning of your procedure. . Build-up your immune system: Take 1000 mg of Vitamin C with every meal (3 times a day) the day prior to your procedure. Marland Kitchen Antibiotics: Inform the staff if you have a condition or reason that requires you to take antibiotics before dental procedures. . Pregnancy: If you are pregnant, call and cancel the procedure. . Sickness: If you have a cold, fever, or any active infections, call and cancel the procedure. . Arrival: You must be in the facility at least 30 minutes prior to your scheduled procedure. . Children: Do not bring children with you. Percell Miller appropriately:  Bring dark clothing that you would not mind if they get stained. . Valuables: Do not bring any jewelry or  valuables.  Reasons to call and reschedule or cancel your procedure: (Following these recommendations will minimize the risk of a serious complication.) . Surgeries: Avoid having procedures within 2 weeks of any surgery. (Avoid for 2 weeks before or after any surgery). . Flu Shots: Avoid having procedures within 2 weeks of a flu shots or . (Avoid for 2 weeks before or after immunizations). . Barium: Avoid having a procedure within 7-10 days after having had a radiological study involving the use of radiological contrast. (Myelograms, Barium swallow or enema study). . Heart attacks: Avoid any elective procedures or surgeries for the initial 6 months after a "Myocardial Infarction" (Heart Attack). . Blood thinners: It is imperative that you stop these medications before procedures. Let us know if you if you take any blood thinner.  . Infection: Avoid procedures during or within two weeks of an infection (including chest colds or gastrointestinal problems). Symptoms associated with infections include: Localized redness, fever, chills, night sweats or profuse sweating, burning sensation when voiding, cough, congestion, stuffiness, runny nose, sore throat, diarrhea, nausea, vomiting, cold or Flu symptoms, recent or current infections. It is specially important if the infection is over the area that we intend to treat. Marland Kitchen Heart and lung problems: Symptoms that may suggest an active cardiopulmonary problem include: cough, chest pain, breathing difficulties or shortness of breath, dizziness, ankle swelling, uncontrolled high or unusually low blood pressure, and/or palpitations. If you are experiencing any of these symptoms, cancel your procedure and contact your primary care physician for an evaluation.  Remember:  Regular Business hours are:  Monday to Thursday 8:00 AM to 4:00 PM  Provider's Schedule: Milinda Pointer, MD:  Procedure days: Tuesday and Thursday 7:30 AM to 4:00 PM  Gillis Santa, MD:   Procedure days: Monday and Wednesday 7:30 AM to 4:00 PM ____________________________________________________________________________________________   ____________________________________________________________________________________________  Blood Thinners  IMPORTANT NOTICE:  If you take any of these, make sure to notify the nursing staff.  Failure to do so may result in injury.  Recommended time intervals to stop and restart blood-thinners, before & after invasive procedures  Generic Name Brand Name Stop Time. Must be stopped at least this long before procedures. After procedures, wait at least this long before re-starting.  Abciximab Reopro 15 days 2 hrs  Alteplase Activase 10 days 10 days  Anagrelide Agrylin    Apixaban Eliquis 3 days 6 hrs  Cilostazol Pletal 3 days 5 hrs  Clopidogrel Plavix 7-10 days 2 hrs  Dabigatran Pradaxa 5 days 6 hrs  Dalteparin Fragmin 24 hours 4 hrs  Dipyridamole Aggrenox 11days 2 hrs  Edoxaban Lixiana; Savaysa 3 days 2 hrs  Enoxaparin  Lovenox 24 hours 4 hrs  Eptifibatide Integrillin 8 hours 2 hrs  Fondaparinux  Arixtra 72 hours 12 hrs  Prasugrel Effient 7-10 days 6 hrs  Reteplase Retavase 10 days 10 days  Rivaroxaban Xarelto 3 days 6 hrs  Ticagrelor Brilinta 5-7 days 6 hrs  Ticlopidine Ticlid 10-14 days 2 hrs  Tinzaparin Innohep 24 hours 4 hrs  Tirofiban Aggrastat 8 hours 2 hrs  Warfarin Coumadin 5 days 2 hrs   Other medications with blood-thinning effects  Product indications Generic (Brand) names Note  Cholesterol Lipitor Stop 4 days before procedure  Blood thinner (injectable) Heparin (LMW or LMWH Heparin) Stop 24 hours before procedure  Cancer Ibrutinib (Imbruvica) Stop 7 days before procedure  Malaria/Rheumatoid Hydroxychloroquine (Plaquenil)  Stop 11 days before procedure  Thrombolytics  10 days before or after procedures   Over-the-counter (OTC) Products with blood-thinning effects  Product Common names Stop Time  Aspirin > 325 mg  Goody Powders, Excedrin, etc. 11 days  Aspirin ? 81 mg  7 days  Fish oil  4 days  Garlic supplements  7 days  Ginkgo biloba  36 hours  Ginseng  24 hours  NSAIDs Ibuprofen, Naprosyn, etc. 3 days  Vitamin E  4 days   ____________________________________________________________________________________________

## 2020-01-05 NOTE — Progress Notes (Signed)
Nursing Pain Medication Assessment:  Safety precautions to be maintained throughout the outpatient stay will include: orient to surroundings, keep bed in low position, maintain call bell within reach at all times, provide assistance with transfer out of bed and ambulation.  Medication Inspection Compliance: Pill count conducted under aseptic conditions, in front of the patient. Neither the pills nor the bottle was removed from the patient's sight at any time. Once count was completed pills were immediately returned to the patient in their original bottle.  Medication: Hydrocodone/APAP Pill/Patch Count: 26 of 120 pills remain Pill/Patch Appearance: Markings consistent with prescribed medication Bottle Appearance: Standard pharmacy container. Clearly labeled. Filled Date: 27 / 41 / 21 Last Medication intake:  Today

## 2020-01-06 ENCOUNTER — Other Ambulatory Visit: Payer: Self-pay | Admitting: Adult Health

## 2020-01-06 ENCOUNTER — Ambulatory Visit: Payer: Medicare Other | Admitting: Pain Medicine

## 2020-01-06 ENCOUNTER — Ambulatory Visit: Payer: Medicare Other | Admitting: Podiatry

## 2020-01-06 DIAGNOSIS — L03115 Cellulitis of right lower limb: Secondary | ICD-10-CM

## 2020-01-06 NOTE — Progress Notes (Signed)
If patient is worsening she may need further evaluation and may need emergency room. Yes she is to go to wound clinic if they called her, the discussion was it is taking a while to get in with wound clinic and she requested dermatology.

## 2020-01-07 NOTE — Progress Notes (Signed)
Noted, has follow up 01/09/20 advised to return to office sooner if worsening at anytime.

## 2020-01-09 ENCOUNTER — Ambulatory Visit (INDEPENDENT_AMBULATORY_CARE_PROVIDER_SITE_OTHER): Payer: Medicare Other | Admitting: Adult Health

## 2020-01-09 ENCOUNTER — Emergency Department: Payer: Medicare Other

## 2020-01-09 ENCOUNTER — Other Ambulatory Visit: Payer: Self-pay

## 2020-01-09 ENCOUNTER — Emergency Department
Admission: EM | Admit: 2020-01-09 | Discharge: 2020-01-09 | Disposition: A | Discharge status: Home / Self Care | Payer: Medicare Other | Attending: Emergency Medicine | Admitting: Emergency Medicine

## 2020-01-09 ENCOUNTER — Encounter: Payer: Self-pay | Admitting: Adult Health

## 2020-01-09 ENCOUNTER — Encounter: Payer: Self-pay | Admitting: Emergency Medicine

## 2020-01-09 ENCOUNTER — Other Ambulatory Visit: Payer: Self-pay | Admitting: Family Medicine

## 2020-01-09 VITALS — BP 110/69 | HR 55 | Temp 98.2°F | Resp 15 | Wt 222.8 lb

## 2020-01-09 DIAGNOSIS — Z79899 Other long term (current) drug therapy: Secondary | ICD-10-CM | POA: Insufficient documentation

## 2020-01-09 DIAGNOSIS — Z794 Long term (current) use of insulin: Secondary | ICD-10-CM | POA: Diagnosis not present

## 2020-01-09 DIAGNOSIS — I110 Hypertensive heart disease with heart failure: Secondary | ICD-10-CM | POA: Insufficient documentation

## 2020-01-09 DIAGNOSIS — I482 Chronic atrial fibrillation, unspecified: Secondary | ICD-10-CM

## 2020-01-09 DIAGNOSIS — R0602 Shortness of breath: Secondary | ICD-10-CM | POA: Diagnosis not present

## 2020-01-09 DIAGNOSIS — E119 Type 2 diabetes mellitus without complications: Secondary | ICD-10-CM | POA: Diagnosis not present

## 2020-01-09 DIAGNOSIS — R410 Disorientation, unspecified: Secondary | ICD-10-CM | POA: Diagnosis not present

## 2020-01-09 DIAGNOSIS — R001 Bradycardia, unspecified: Secondary | ICD-10-CM

## 2020-01-09 DIAGNOSIS — I2510 Atherosclerotic heart disease of native coronary artery without angina pectoris: Secondary | ICD-10-CM | POA: Diagnosis not present

## 2020-01-09 DIAGNOSIS — Z7901 Long term (current) use of anticoagulants: Secondary | ICD-10-CM | POA: Diagnosis not present

## 2020-01-09 DIAGNOSIS — I509 Heart failure, unspecified: Secondary | ICD-10-CM | POA: Diagnosis not present

## 2020-01-09 DIAGNOSIS — Z5181 Encounter for therapeutic drug level monitoring: Secondary | ICD-10-CM | POA: Diagnosis not present

## 2020-01-09 DIAGNOSIS — E039 Hypothyroidism, unspecified: Secondary | ICD-10-CM | POA: Diagnosis not present

## 2020-01-09 DIAGNOSIS — I5032 Chronic diastolic (congestive) heart failure: Secondary | ICD-10-CM | POA: Diagnosis not present

## 2020-01-09 DIAGNOSIS — R531 Weakness: Secondary | ICD-10-CM | POA: Diagnosis not present

## 2020-01-09 DIAGNOSIS — I4891 Unspecified atrial fibrillation: Secondary | ICD-10-CM | POA: Diagnosis not present

## 2020-01-09 DIAGNOSIS — R400 Somnolence: Secondary | ICD-10-CM | POA: Diagnosis not present

## 2020-01-09 DIAGNOSIS — Z87898 Personal history of other specified conditions: Secondary | ICD-10-CM

## 2020-01-09 DIAGNOSIS — I872 Venous insufficiency (chronic) (peripheral): Secondary | ICD-10-CM

## 2020-01-09 DIAGNOSIS — L03115 Cellulitis of right lower limb: Secondary | ICD-10-CM

## 2020-01-09 LAB — CBC WITH DIFFERENTIAL/PLATELET
Abs Immature Granulocytes: 0.04 10*3/uL (ref 0.00–0.07)
Basophils Absolute: 0 10*3/uL (ref 0.0–0.1)
Basophils Relative: 0 %
Eosinophils Absolute: 0.1 10*3/uL (ref 0.0–0.5)
Eosinophils Relative: 1 %
HCT: 37 % (ref 36.0–46.0)
Hemoglobin: 11.9 g/dL — ABNORMAL LOW (ref 12.0–15.0)
Immature Granulocytes: 0 %
Lymphocytes Relative: 14 %
Lymphs Abs: 1.5 10*3/uL (ref 0.7–4.0)
MCH: 29.5 pg (ref 26.0–34.0)
MCHC: 32.2 g/dL (ref 30.0–36.0)
MCV: 91.8 fL (ref 80.0–100.0)
Monocytes Absolute: 0.7 10*3/uL (ref 0.1–1.0)
Monocytes Relative: 6 %
Neutro Abs: 8.6 10*3/uL — ABNORMAL HIGH (ref 1.7–7.7)
Neutrophils Relative %: 79 %
Platelets: 225 10*3/uL (ref 150–400)
RBC: 4.03 MIL/uL (ref 3.87–5.11)
RDW: 13.4 % (ref 11.5–15.5)
WBC: 10.9 10*3/uL — ABNORMAL HIGH (ref 4.0–10.5)
nRBC: 0 % (ref 0.0–0.2)

## 2020-01-09 LAB — COMPREHENSIVE METABOLIC PANEL
ALT: 14 U/L (ref 0–44)
AST: 26 U/L (ref 15–41)
Albumin: 3.4 g/dL — ABNORMAL LOW (ref 3.5–5.0)
Alkaline Phosphatase: 106 U/L (ref 38–126)
Anion gap: 12 (ref 5–15)
BUN: 36 mg/dL — ABNORMAL HIGH (ref 8–23)
CO2: 29 mmol/L (ref 22–32)
Calcium: 9 mg/dL (ref 8.9–10.3)
Chloride: 98 mmol/L (ref 98–111)
Creatinine, Ser: 1.02 mg/dL — ABNORMAL HIGH (ref 0.44–1.00)
GFR, Estimated: 54 mL/min — ABNORMAL LOW (ref >60)
Glucose, Bld: 179 mg/dL — ABNORMAL HIGH (ref 70–99)
Potassium: 4.2 mmol/L (ref 3.5–5.1)
Sodium: 139 mmol/L (ref 135–145)
Total Bilirubin: 1.1 mg/dL (ref 0.3–1.2)
Total Protein: 7.2 g/dL (ref 6.5–8.1)

## 2020-01-09 LAB — LACTIC ACID, PLASMA: Lactic Acid, Venous: 1.4 mmol/L (ref 0.5–1.9)

## 2020-01-09 LAB — PROTIME-INR
INR: 1.4 — ABNORMAL HIGH (ref 0.8–1.2)
Prothrombin Time: 17 seconds — ABNORMAL HIGH (ref 11.4–15.2)

## 2020-01-09 LAB — POCT INR
INR: 1.6 — AB (ref 2.0–3.0)
Prothrombin Time: 18.6

## 2020-01-09 MED ORDER — MUPIROCIN 2 % EX OINT: TOPICAL_OINTMENT | Freq: Every day | CUTANEOUS | 0 refills | Status: DC

## 2020-01-09 MED ORDER — FUROSEMIDE 10 MG/ML IJ SOLN
40.0000 mg | Freq: Once | INTRAMUSCULAR | Status: AC
  Administered 2020-01-09: 40 mg via INTRAVENOUS
  Filled 2020-01-09: qty 4

## 2020-01-09 MED ORDER — CLINDAMYCIN HCL 300 MG PO CAPS: 300.0000 mg | ORAL_CAPSULE | Freq: Three times a day (TID) | ORAL | 0 refills | Status: AC

## 2020-01-09 MED ORDER — FLUCONAZOLE 150 MG PO TABS: 150.0000 mg | ORAL_TABLET | ORAL | 0 refills | Status: AC

## 2020-01-09 MED ORDER — PIPERACILLIN-TAZOBACTAM 3.375 G IVPB 30 MIN
3.3750 g | Freq: Once | INTRAVENOUS | Status: AC
  Administered 2020-01-09: 3.375 g via INTRAVENOUS
  Filled 2020-01-09: qty 50

## 2020-01-09 NOTE — ED Notes (Signed)
Pt assisted to restroom with standby assist. Pt requesting food, EDP cleared pt to eat. Pt's visitor bringing food for pt.

## 2020-01-09 NOTE — Patient Instructions (Signed)
Emergency room NOW, Emergency room by 911 declined.

## 2020-01-09 NOTE — ED Provider Notes (Signed)
[facility] Emergency Department Provider Note ____________________________________________   Event Date/Time   First MD Initiated Contact with Patient 01/09/20 1633     (approximate)  I have reviewed the triage vital signs and the nursing notes.   HISTORY  Chief Complaint Wound Check    HPI Tracy Mann is a 86 y.o. female with PMH as noted below including atrial fibrillation on Coumadin, CHF, hypertension, and diabetes who presents with concern for cellulitis to the right lower leg, gradual onset over the last 10 to 12 days, and minimally improved after she took a course of Augmentin starting on 12/1. The patient states that she has had some increased leg swelling as well. She reports occasional shooting pains but denies any pain or swelling going up the leg. She has no fever or chills. She states that she saw her primary care provider today who sent her to the hospital.  Past Medical History:  Diagnosis Date  . Acute myocardial infarction of inferior wall ([place])    a. 10/2005 - Presumed to be 2/2 to a coronary embolus in setting of persistent Afib-->Cath 2014 relatively nl cors, EF 45-50% w/ severe distal inferior/apical inferior HK w/ aneursymal appearance.  . Arthritis   . Chronic anticoagulation    a. warfarin.  . Chronic atrial fibrillation (HCC)    a. CHA2DS2VASc = 6--> warfarin.  . Chronic combined systolic and diastolic CHF (congestive heart failure) ([place])    a. 10/2012 Echo: EF 35-40%, diff HK, mild MR, mild biatrial enlargement;  b. 11/2012 EF 45-50% by LV gram; c. echo 07/2014: EF 40-45%, mod conc LVH, mod MR, severe biatrial enlargement, PASP 45 mm Hg c. Echo 01/2018 EF 45-50%, duffuse hypokinesis, mild MR, LA mod dilated, norm RVSF, dilated IVC  . Essential hypertension   . Hyperlipidemia, mixed   . Mixed Ischemic/Nonischemic Cardiomyopathy    a. 10/2012 Echo: EF 35-40%;  b. 11/2012 EF 45-50% by LV gram.  . Nonobstructive coronary artery  disease    a. 2007 inferior wall MI thought to be secondary to thrombus from atrial fib b. 2014 cath with R dominant coronary arterial system with mild luminal irregularities c. Myoview 05/2015 old inferior MI, no new ischemic changes  . Obesity   . Thyroid disease    hypothyroidism  . Type II diabetes mellitus ([place])    a. 07/2018 A1c 8.7 - long term insulin use  . Venous insufficiency   . Vitamin D deficiency     Patient Active Problem List   Diagnosis Date Noted  . Subtherapeutic anticoagulation 01/09/2020  . Confusion 01/09/2020  . Sleepiness 01/09/2020  . Bradycardia 01/09/2020  . Bilateral lower extremity edema 01/02/2020  . Wound cellulitis 01/02/2020  . Cellulitis of right lower leg 01/02/2020  . Elevated brain natriuretic peptide (BNP) level 01/02/2020  . Chronic hip pain (2ry area of Pain) (Bilateral) (R>L) 12/09/2019  . Osteoarthritis involving multiple joints 12/09/2019  . Osteoarthritis of knee (Right) 12/09/2019  . Osteoarthritis of facet joint of lumbar spine 12/09/2019  . Osteoarthritis of lumbar spine 12/09/2019  . Pharmacologic therapy 12/08/2019  . Lumbosacral facet syndrome 12/08/2019  . Lumbar lateral recess stenosis (L4-5) (Right) 11/26/2019  . Chronic low back pain (Bilateral) w/ sciatica (Bilateral) 11/26/2019  . Lumbosacral radiculopathy at L5 (Bilateral) 11/26/2019  . Chronic lower extremity pain (1ry area of Pain) (Bilateral) 11/26/2019  . Hypoglycemia associated with diabetes ([place]) 11/26/2019  . History of tachycardia 11/25/2019  . Chronic pain syndrome 11/25/2019  . Disorder of skeletal  system 11/25/2019  . Problems influencing health status 11/25/2019  . Abnormal MRI, lumbar spine (08/22/2019) 11/25/2019  . Lumbar facet hypertrophy (Multilevel) (Bilateral) 11/25/2019  . Lumbar central spinal stenosis (SEVERE) (L3-4) with neurogenic claudication 11/25/2019  . Grade 1 Anterolisthesis of lumbar spine (L4/L5) 11/25/2019  . Lumbar facet arthropathy (L3-4  right-sided facet edema/joint effusion) 11/25/2019  . Chronic venous insufficiency 07/27/2019  . Statin intolerance 06/29/2019  . Lymphedema 06/25/2019  . Vitamin D deficiency 05/21/2019  . Angina pectoris ([place]) 06/07/2015  . Angina at rest [facility]) 05/20/2015  . Fatigue 03/03/2015  . Mixed Ischemic/Nonischemic Cardiomyopathy   . Chronic atrial fibrillation ([place])   . Chronic anticoagulation (Coumadin)   . Airway hyperreactivity 07/08/2014  . Cervical muscle strain 07/08/2014  . PNA (pneumonia) 07/08/2014  . Chest pressure 07/08/2014  . Clinical depression 07/08/2014  . DDD (degenerative disc disease), lumbosacral 07/08/2014  . Accumulation of fluid in tissues 07/08/2014  . Anxiety, generalized 07/08/2014  . Acid reflux 07/08/2014  . Folliculitis 38/46/6599  . Broken leg 07/08/2014  . Cannot sleep 07/08/2014  . Obstructive sleep apnea 07/08/2014  . Onychia of finger 07/08/2014  . Psoriasis 07/08/2014  . Restless legs syndrome 07/08/2014  . Adult BMI 30+ 07/08/2014  . Hypothyroidism 09/04/2013  . Type 2 diabetes mellitus without complications ([place]) 35/70/1779  . Calculus of kidney 10/31/2011  . Bladder infection, chronic 10/31/2011  . Incomplete bladder emptying 10/31/2011  . Obesity 11/02/2010  . Shortness of breath 02/01/2010  . Chronic diastolic heart failure ([place]) 02/08/2009  . Hyperlipidemia 09/16/2008  . CAD, NATIVE VESSEL 09/16/2008  . Atrial fibrillation ([place]) 09/16/2008  . Benign hypertensive heart disease without congestive heart failure 09/16/2008    Past Surgical History:  Procedure Laterality Date  . CARDIAC CATHETERIZATION  11/2012   [facility];EF 45-50%  . CHOLECYSTECTOMY  1999  . TUBAL LIGATION  1968  . VESICOVAGINAL FISTULA CLOSURE W/ TAH  1996    Prior to Admission medications   Medication Sig Start Date End Date Taking? Authorizing Provider  ACCU-CHEK AVIVA PLUS test strip USE WITH METER TO CHECK GLUCOSE BEFORE MEALS AND AT BEDTIME 03/05/19   Jerrol Banana., MD  amoxicillin-clavulanate (AUGMENTIN) 875-125 MG tablet Take 1 tablet by mouth 2 (two) times daily. 12/31/19   Flinchum, Kelby Aline, FNP  clindamycin (CLEOCIN) 300 MG capsule Take 1 capsule (300 mg total) by mouth 3 (three) times daily for 10 days. 01/09/20 01/19/20  Arta Silence, MD  estradiol (ESTRACE) 0.1 MG/GM vaginal cream Place 1 Applicatorful vaginally at bedtime. 07/24/19   Jerrol Banana., MD  ezetimibe (ZETIA) 10 MG tablet TAKE 1 TABLET BY MOUTH EVERY DAY 07/17/19   Jerrol Banana., MD  flavoxATE (URISPAS) 100 MG tablet Take 1 tablet (100 mg total) by mouth 2 (two) times daily as needed for bladder spasms. 04/14/19   Bjorn Loser, MD  fluconazole (DIFLUCAN) 150 MG tablet Take 1 tablet (150 mg total) by mouth once a week for 2 doses. 01/09/20 01/17/20  Arta Silence, MD  fluticasone [facility]) 50 MCG/ACT nasal spray SPRAY TWO SPRAYS IN EACH NOSTRIL ONCE DAILY 04/21/19   Jerrol Banana., MD  HYDROcodone-acetaminophen [facility]) 10-325 MG tablet Take 1 tablet by mouth every 6 (six) hours as needed for severe pain. Must last 30 days. 01/10/20 02/09/20  Milinda Pointer, MD  HYDROcodone-acetaminophen (NORCO) 10-325 MG tablet Take 1 tablet by mouth every 6 (six) hours as needed for severe pain. Must last 30 days. 02/09/20 03/10/20  Milinda Pointer, MD  insulin glargine (LANTUS SOLOSTAR) 100 UNIT/ML Solostar Pen Inject 20 units nightly between 8-10 PM. Adjust as directed. Max dose is 50 units daily. 05/20/19   [provider]  insulin lispro (HUMALOG KWIKPEN) 100 UNIT/ML KwikPen INJECT 10-20 UNITS THREE TIMES DAILY WITH EACH MEAL. 09/18/19   Jerrol Banana., MD  Insulin Pen Needle (RELION PEN NEEDLE 31G/8MM) 31G X 8 MM MISC Use with insulin pen three times daily 03/24/19   Jerrol Banana., MD  levothyroxine (SYNTHROID) 112 MCG tablet Take 1 tablet (112 mcg total) by mouth daily. 01/01/20   Flinchum, Kelby Aline, FNP  lisinopril  (ZESTRIL) 2.5 MG tablet TAKE 1 TABLET BY MOUTH EVERYDAY AT BEDTIME 07/28/19   Jerrol Banana., MD  LORazepam (ATIVAN) 1 MG tablet TAKE 1 TABLET (1 MG TOTAL) BY MOUTH 2 (TWO) TIMES DAILY AS NEEDED. 11/27/19   Jerrol Banana., MD  Magnesium 500 MG CAPS Take 1,000 mg by mouth daily.     [provider]  metolazone (ZAROXOLYN) 2.5 MG tablet Take 1 tablet (2.5 mg total) by mouth 2 (two) times a week. TAKE 30 minutes before Torsemide 11/13/19 05/13/20  Loel Dubonnet, NP  mupirocin ointment (BACTROBAN) 2 % Apply topically daily. 01/09/20   Arta Silence, MD  nitroGLYCERIN (NITROSTAT) 0.4 MG SL tablet Place 1 tablet (0.4 mg total) under the tongue every 5 (five) minutes as needed for chest pain. 02/12/18   Minna Merritts, MD  NONFORMULARY OR COMPOUNDED ITEM Diclofenac/Baclofen/Bu/Gabapentin Compounded Cream    [provider]  NONFORMULARY OR COMPOUNDED ITEM See pharmacy note- Not to use on any open skin or wound. 12/31/19   Flinchum, Kelby Aline, FNP  phenazopyridine (PYRIDIUM) 200 MG tablet Take 1 tablet (200 mg total) by mouth 3 (three) times daily as needed for pain. Patient not taking: Reported on 01/05/2020 05/02/19   Chrismon, Vickki Muff, PA-C  potassium chloride SA (KLOR-CON) 20 MEQ tablet Take 1-2 tablets with metolazone    [provider]  torsemide (DEMADEX) 20 MG tablet Take 1 tablet (20 mg total) by mouth daily. 09/03/19   Loel Dubonnet, NP  Vitamin D, Ergocalciferol, (DRISDOL) 50000 units CAPS capsule Take 50,000 Units by mouth. Going to restart    [provider]  warfarin (COUMADIN) 4 MG tablet TAKE 1 TABLET BY MOUTH EVERY DAY Patient taking differently: Pt alter dose everyday. She takes coumadin 4mg  and 3 mg QOD 10/27/19   Jerrol Banana., MD  rOPINIRole (REQUIP) 0.25 MG tablet TAKE 1 TABLET BY MOUTH EVERYDAY AT BEDTIME 10/29/17 08/13/18  Jerrol Banana., MD    Allergies Duloxetine; Other; Paroxetine; Sulfa antibiotics;  Cephalexin; Clarithromycin; Diphenhydramine; Estrogens conjugated; Estrogens, conjugated; Sulfonamide derivatives; and Nitrofurantoin  Family History  Problem Relation Age of Onset  . Heart disease Mother   . Thyroid disease Father     Social History Social History   Tobacco Use  . Smoking status: Never Smoker  . Smokeless tobacco: Never Used  Vaping Use  . Vaping Use: Never used  Substance Use Topics  . Alcohol use: No  . Drug use: No    Review of Systems  Constitutional: No fever/chills. Eyes: No visual changes. ENT: No sore throat. Cardiovascular: Denies chest pain. Respiratory: Denies shortness of breath. Gastrointestinal: No vomiting. Genitourinary: Negative for dysuria.  Musculoskeletal: Negative for back pain. Skin: Negative for rash. Neurological: Negative for headaches, focal weakness or numbness.   ____________________________________________   PHYSICAL EXAM:  VITAL SIGNS: ED Triage Vitals  Enc Vitals Group     BP 01/09/20 1347 (!) 119/46     Pulse Rate 01/09/20 1343 69     Resp 01/09/20 1343 16     Temp 01/09/20 1347 98.1 F (36.7 C)     Temp Source 01/09/20 1347 Oral     SpO2 01/09/20 1343 91 %     Weight 01/09/20 1341 220 lb (99.8 kg)     Height 01/09/20 1341 5\' 5"  (1.651 m)     Head Circumference --      Peak Flow --      Pain Score 01/09/20 1341 0     Pain Loc --      Pain Edu? --      Excl. in [place]? --     Constitutional: Alert and oriented. Relatively well appearing and in no acute distress. Eyes: Conjunctivae are normal.  Head: Atraumatic. Nose: No congestion/rhinnorhea. Mouth/Throat: Mucous membranes are moist.   Neck: Normal range of motion.  Cardiovascular: Normal rate, regular rhythm.  Good peripheral circulation. Respiratory: Normal respiratory effort.  No retractions.  Gastrointestinal: No distention.  Musculoskeletal: 1+ bilateral lower extremity edema.  Extremities warm and well perfused.  Neurologic:  Normal speech and  language. No gross focal neurologic deficits are appreciated.  Skin:  Skin is warm and dry. Erythema and induration to the right anterior lower leg extending to approximately 10 centimeters below the knee, with an approximately 3 x 6 cm area of slightly macerated skin but no open wound or purulent drainage. No fluctuance. Psychiatric: Mood and affect are normal. Speech and behavior are normal.  ____________________________________________   LABS (all labs ordered are listed, but only abnormal results are displayed)  Labs Reviewed  CBC WITH DIFFERENTIAL/PLATELET - Abnormal; Notable for the following components:      Result Value   WBC 10.9 (*)    Hemoglobin 11.9 (*)    Neutro Abs 8.6 (*)    All other components within normal limits  COMPREHENSIVE METABOLIC PANEL - Abnormal; Notable for the following components:   Glucose, Bld 179 (*)    BUN 36 (*)    Creatinine, Ser 1.02 (*)    Albumin 3.4 (*)    GFR, Estimated 54 (*)    All other components within normal limits  PROTIME-INR - Abnormal; Notable for the following components:   Prothrombin Time 17.0 (*)    INR 1.4 (*)    All other components within normal limits  LACTIC ACID, PLASMA  LACTIC ACID, PLASMA   ____________________________________________  EKG  ED ECG REPORT I, Arta Silence, the attending physician, personally viewed and interpreted this ECG.  Date: 01/09/2020 EKG Time: 1358 Rate: 76 Rhythm: Atrial fibrillation QRS Axis: Left axis Intervals: LBBB ST/T Wave abnormalities: normal Narrative Interpretation: no evidence of acute ischemia; no significant change when compared to EKG of 11/13/2019  ____________________________________________  RADIOLOGY    ____________________________________________   PROCEDURES  Procedure(s) performed: No  Procedures  Critical Care performed: No ____________________________________________   INITIAL IMPRESSION / ASSESSMENT AND PLAN / ED COURSE  Pertinent labs  & imaging results that were available during my care of the patient were reviewed by me and considered in my medical decision making (see chart for details).  86 year old female with PMH as noted above presents with concern for cellulitis to the right lower extremity, sent in by her primary care practitioner FNP [place] after visit today.  I reviewed the past medical records in epic. The patient was started on Augmentin on 12/1. She took 3  days of a double dose of torsemide (40 mg) started on 12/3. Per the note from today, the area of potential cellulitis did not appear to have improved over the last 9 days. She was also noted to have a subtherapeutic INR and had a period of bradycardia with a heart rate between 35 and 55.  The patient herself reports that she felt somewhat dizzy and "out of it" when this happened.  On exam, the patient is overall well-appearing. Her vitals are normal. She has an area of erythema and induration to the right lower extremity as described above, with a central superficial appearing wound weeping serosanguineous fluid but no frank pus. Exam is otherwise as described above.  Overall presentation is indeed consistent with cellulitis. The patient herself feels like it has possibly slightly improved. She denies fever or systemic symptoms. However, it has not resolved despite the antibiotic course and the primary care provider was also concerned about bradycardia and a persistently subtherapeutic INR.  We will obtain lab work-up including sepsis labs, give a dose of Zosyn in the ED, and reassess.  Additionally, the patient reports some vaginal irritation consistent with prior yeast infections. She is currently in a hallway bed but I will examine her when she is moved into an exam room.  ----------------------------------------- 9:38 PM on 01/09/2020 -----------------------------------------  The lab work-up has been generally reassuring.  The patient has only mild  leukocytosis and a normal lactate.  The INR is subtherapeutic.  I had an extensive discussion with the patient about disposition.  I recommended admission given that the cellulitis has not really gotten better and also with the episode of bradycardia earlier today and the subtherapeutic INR.  However, the patient expressed a strong preference to go home unless absolutely necessary.  Since she is overall stable, afebrile, with no evidence of systemic infection and sepsis, and no significant worsening in her cellulitis, I feel that this is also reasonable.  Her vital signs have been stable throughout her ED visit with no recurrent bradycardia, she is alert and oriented, and overall appears well.  The patient received a dose of Zosyn in the ED.  I initially plan to continue Augmentin and add Bactrim, but the patient is allergic to sulfa drugs.  I will therefore switch her to clindamycin.  I will also give Bactroban as topical treatment given the irritated skin in the area.  The patient ultimately declined vaginal exam stating that she did not want to get undressed given her arthritis.  I will empirically prescribe p.o. Diflucan.  I had an extensive discussion with the patient about the plan of care and return precautions.  She demonstrates appropriate understanding and agrees to follow-up with her PMD next week.  ____________________________________________   FINAL CLINICAL IMPRESSION(S) / ED DIAGNOSES  Final diagnoses:  Cellulitis of right lower extremity      NEW MEDICATIONS STARTED DURING THIS VISIT:  New Prescriptions   CLINDAMYCIN (CLEOCIN) 300 MG CAPSULE    Take 1 capsule (300 mg total) by mouth 3 (three) times daily for 10 days.   FLUCONAZOLE (DIFLUCAN) 150 MG TABLET    Take 1 tablet (150 mg total) by mouth once a week for 2 doses.   MUPIROCIN OINTMENT (BACTROBAN) 2 %    Apply topically daily.     Note:  This document was prepared using Dragon voice recognition software and may  include unintentional dictation errors.   Arta Silence, MD 01/09/20 2238

## 2020-01-09 NOTE — Discharge Instructions (Addendum)
Finish the augmentin that you are taking.    Start taking the clindamycin and continue for a full 10 days.    You may apply the bactroban ointment to the skin.   Take the diflucan once now and again in 1 week.  Follow up with your primary care provider in the next week.  Return to the ER immediately for new or worsening redness or swelling in the leg, redness spreading up the leg, pus draining from the wound, fever or chills, weakness, or any other new or worsening symptoms that concern you.

## 2020-01-09 NOTE — Progress Notes (Signed)
Established patient visit   Patient: Tracy Mann   DOB: 21-Jul-1933   86 y.o. Female  MRN: [id number] Visit Date: 01/09/2020  Today's healthcare provider: Marcille Buffy, FNP   Chief Complaint  Patient presents with  . Follow-up   Subjective    HPI  Follow up for cellulitis of right lower leg   The patient was last seen for this 1 weeks ago. Changes made at last visit include referral placed for [facility] D[facility]ology, continue Augmentin.  She reports good compliance with treatment. She feels that condition is Unchanged. She is not having side effects.   She reports she increased her Warfarin as directed at last office visit.  She called two days ago and was advised she should go to the emergency room, however she did not. Her right leg cellulitis had been improving on the Keflex at last visit  01/02/2020 and she was advised to be seen if any symptoms worsening. She called office on 12/7/2021and was advised to go to the emergency room for evaluation that day, she declined, provider did order CBC, however patient did not go to have it done.   Today she presents to the office, reports she is very tired, she fell asleep coming to the office while driving, she states " I just feel bamboozled,  my ears heart, and my heart rate was very high last night so I took Metoprolol 40mg  once.   Today heart rate in office is variable between 35 and 55 beats per minute on arrival.  She denies any chest pain or shortness of breath. Blood sugar is in the 180's today.Marland Kitchen    -----------------------------------------------------------------------------------------      Medications: Outpatient Medications Prior to Visit  Medication Sig  . ACCU-CHEK AVIVA PLUS test strip USE WITH METER TO CHECK GLUCOSE BEFORE MEALS AND AT BEDTIME  . amoxicillin-clavulanate (AUGMENTIN) 875-125 MG tablet Take 1 tablet by mouth 2 (two) times daily.  Marland Kitchen estradiol (ESTRACE) 0.1 MG/GM vaginal cream  Place 1 Applicatorful vaginally at bedtime.  Marland Kitchen ezetimibe (ZETIA) 10 MG tablet TAKE 1 TABLET BY MOUTH EVERY DAY  . flavoxATE (URISPAS) 100 MG tablet Take 1 tablet (100 mg total) by mouth 2 (two) times daily as needed for bladder spasms.  . fluticasone (FLONASE) 50 MCG/ACT nasal spray SPRAY TWO SPRAYS IN EACH NOSTRIL ONCE DAILY  . [START ON 01/10/2020] HYDROcodone-acetaminophen (NORCO) 10-325 MG tablet Take 1 tablet by mouth every 6 (six) hours as needed for severe pain. Must last 30 days.  Derrill Memo ON 02/09/2020] HYDROcodone-acetaminophen (NORCO) 10-325 MG tablet Take 1 tablet by mouth every 6 (six) hours as needed for severe pain. Must last 30 days.  . insulin glargine (LANTUS SOLOSTAR) 100 UNIT/ML Solostar Pen Inject 20 units nightly between 8-10 PM. Adjust as directed. Max dose is 50 units daily.  . insulin lispro (HUMALOG KWIKPEN) 100 UNIT/ML KwikPen INJECT 10-20 UNITS THREE TIMES DAILY WITH EACH MEAL.  Marland Kitchen Insulin Pen Needle (RELION PEN NEEDLE 31G/8MM) 31G X 8 MM MISC Use with insulin pen three times daily  . levothyroxine (SYNTHROID) 112 MCG tablet Take 1 tablet (112 mcg total) by mouth daily.  Marland Kitchen lisinopril (ZESTRIL) 2.5 MG tablet TAKE 1 TABLET BY MOUTH EVERYDAY AT BEDTIME  . LORazepam (ATIVAN) 1 MG tablet TAKE 1 TABLET (1 MG TOTAL) BY MOUTH 2 (TWO) TIMES DAILY AS NEEDED.  . Magnesium 500 MG CAPS Take 1,000 mg by mouth daily.   . metolazone (ZAROXOLYN) 2.5 MG tablet Take 1  tablet (2.5 mg total) by mouth 2 (two) times a week. TAKE 30 minutes before Torsemide  . nitroGLYCERIN (NITROSTAT) 0.4 MG SL tablet Place 1 tablet (0.4 mg total) under the tongue every 5 (five) minutes as needed for chest pain.  . NONFORMULARY OR COMPOUNDED ITEM Diclofenac/Baclofen/Bu/Gabapentin Compounded Cream  . NONFORMULARY OR COMPOUNDED ITEM See pharmacy note- Not to use on any open skin or wound.  . phenazopyridine (PYRIDIUM) 200 MG tablet Take 1 tablet (200 mg total) by mouth 3 (three) times daily as needed for pain.  (Patient not taking: Reported on 01/05/2020)  . potassium chloride SA (KLOR-CON) 20 MEQ tablet Take 1-2 tablets with metolazone  . torsemide (DEMADEX) 20 MG tablet Take 1 tablet (20 mg total) by mouth daily.  . Vitamin D, Ergocalciferol, (DRISDOL) 50000 units CAPS capsule Take 50,000 Units by mouth. Going to restart  . warfarin (COUMADIN) 4 MG tablet TAKE 1 TABLET BY MOUTH EVERY DAY (Patient taking differently: Pt alter dose everyday. She takes coumadin 4mg  and 3 mg QOD)   No facility-administered medications prior to visit.    Review of Systems  Constitutional: Positive for activity change and fatigue. Negative for appetite change, chills, diaphoresis, fever and unexpected weight change.  HENT: Negative.   Respiratory: Negative.   Cardiovascular: Positive for palpitations. Negative for chest pain and leg swelling.  Genitourinary: Negative.   Musculoskeletal: Positive for arthralgias.  Skin: Positive for color change and wound.  Neurological: Positive for weakness and light-headedness. Negative for syncope.  Psychiatric/Behavioral: Positive for confusion. The patient is not nervous/anxious.     Last CBC Lab Results  Component Value Date   WBC 12.1 (H) 12/31/2019   HGB 12.7 12/31/2019   HCT 37.1 12/31/2019   MCV 88 12/31/2019   MCH 30.2 12/31/2019   RDW 12.4 12/31/2019   PLT 223 12/31/2019      Objective    BP 110/69   Pulse (!) 55   Temp 98.2 F (36.8 C) (Oral)   Resp 15   Wt 222 lb 12.8 oz (101.1 kg)   SpO2 93%   BMI 35.96 kg/m  BP Readings from Last 3 Encounters:  01/09/20 110/69  01/05/20 110/89  01/02/20 140/60   Wt Readings from Last 3 Encounters:  01/09/20 222 lb 12.8 oz (101.1 kg)  01/05/20 218 lb (98.9 kg)  01/02/20 230 lb (104.3 kg)      Physical Exam Constitutional:      General: She is not in acute distress.    Appearance: Normal appearance. She is ill-appearing. She is not toxic-appearing or diaphoretic.  Cardiovascular:     Rate and Rhythm:  Bradycardia present. Rhythm irregular.  Pulmonary:     Effort: Pulmonary effort is normal. No respiratory distress.     Breath sounds: Normal breath sounds. No stridor. No wheezing, rhonchi or rales.  Chest:     Chest wall: No tenderness.  Neurological:     Motor: Weakness present.     Gait: Gait abnormal.  Psychiatric:        Attention and Perception: She is inattentive.        Speech: Speech is delayed.        Behavior: Behavior is slowed.        Cognition and Memory: Cognition is impaired.      Results for orders placed or performed in visit on 01/09/20  POCT INR  Result Value Ref Range   INR 1.6 (A) 2.0 - 3.0   Prothrombin Time 18.6     Assessment &  Plan     Concern for sepsis, atrial fibrillation or other arhythmia. DVT, stroke, TIA, given INR subtherapeutic even with increased dose change and antibiotics on board.  Cellulitis of right lower leg is not worse but not substantially improved since last visit.  She needs IV antibiotics now. Diabetes compromising wound healing as well as history of steroid infections of which have been advised to be placed on hold until further notice.   Heartrate was 35 to 55 upon check into room at office.  No EKG performed as unable to get patient onto exam table.   Advised 911 for patient to be transported to the hospital emergency department, she declined and calls her son Ameria Sanjurjo on cell phone , provider spoke with him and stressed urgency of needing his mother to go to the ER now. He advised he would come pick her up and declined 911.    Return in about 3 days (around 01/12/2020), or if symptoms worsen or fail to improve, for at any time for any worsening symptoms.     The entirety of the information documented in the History of Present Illness, Review of Systems and Physical Exam were personally obtained by me. Portions of this information were initially documented by the CMA and reviewed by me for thoroughness and accuracy.    Nunzio Cory CMA in the room for entire visit.    Marcille Buffy, [place] [phone] (phone) [phone] (fax)  [place]

## 2020-01-09 NOTE — ED Triage Notes (Signed)
Pt to ED via POV, pt states that she was sent by her PCP office for wound infection. Pt states that they told her that it was not healing properly and that she needed to come to the ED for possible IV antibiotics. Pt also states that she is currently on UTI medication. Pt states that she thinks that she now has a yeast infection and would like to get checked as well. Pt is in NAD.

## 2020-01-11 ENCOUNTER — Other Ambulatory Visit: Payer: Self-pay | Admitting: Family Medicine

## 2020-01-11 NOTE — Telephone Encounter (Signed)
Requested Prescriptions  Pending Prescriptions Disp Refills  . lisinopril (ZESTRIL) 2.5 MG tablet [Pharmacy Med Name: LISINOPRIL 2.5 MG TABLET] 90 tablet 0    Sig: TAKE 1 TABLET BY MOUTH EVERYDAY AT BEDTIME     Cardiovascular:  ACE Inhibitors Failed - 01/11/2020  1:59 PM      Failed - Cr in normal range and within 180 days    Creatinine  Date Value Ref Range Status  01/23/2014 0.94 0.60 - 1.30 mg/dL Final   Creatinine, Ser  Date Value Ref Range Status  01/09/2020 1.02 (H) 0.44 - 1.00 mg/dL Final         Passed - K in normal range and within 180 days    Potassium  Date Value Ref Range Status  01/09/2020 4.2 3.5 - 5.1 mmol/L Final  01/23/2014 4.4 3.5 - 5.1 mmol/L Final         Passed - Patient is not pregnant      Passed - Last BP in normal range    BP Readings from Last 1 Encounters:  01/09/20 (!) 130/51         Passed - Valid encounter within last 6 months    Recent Outpatient Visits          2 days ago Chronic atrial fibrillation ([place])   [place], Kelby Aline, FNP   1 week ago Shortness of breath   [place], FNP   1 week ago Chronic venous insufficiency   [place], FNP   3 weeks ago Sleep disturbance   [place], PA-C   2 months ago Recurrent major depressive disorder, in partial remission [facility])   [facility] Jerrol Banana., MD      Future Appointments            In 4 days Jerrol Banana., MD [facility], [place]   In 2 months [name], Kathlene November, MD [facility], [place]

## 2020-01-12 DIAGNOSIS — E1165 Type 2 diabetes mellitus with hyperglycemia: Secondary | ICD-10-CM | POA: Diagnosis not present

## 2020-01-12 DIAGNOSIS — E782 Mixed hyperlipidemia: Secondary | ICD-10-CM | POA: Diagnosis not present

## 2020-01-12 DIAGNOSIS — E1159 Type 2 diabetes mellitus with other circulatory complications: Secondary | ICD-10-CM | POA: Diagnosis not present

## 2020-01-12 DIAGNOSIS — E1129 Type 2 diabetes mellitus with other diabetic kidney complication: Secondary | ICD-10-CM | POA: Diagnosis not present

## 2020-01-12 DIAGNOSIS — E1169 Type 2 diabetes mellitus with other specified complication: Secondary | ICD-10-CM | POA: Diagnosis not present

## 2020-01-13 ENCOUNTER — Ambulatory Visit: Payer: Medicare Other | Admitting: Podiatry

## 2020-01-15 ENCOUNTER — Other Ambulatory Visit: Payer: Self-pay

## 2020-01-15 ENCOUNTER — Ambulatory Visit (INDEPENDENT_AMBULATORY_CARE_PROVIDER_SITE_OTHER): Payer: Medicare Other | Admitting: Family Medicine

## 2020-01-15 ENCOUNTER — Encounter: Payer: Self-pay | Admitting: Family Medicine

## 2020-01-15 VITALS — BP 121/71 | HR 88 | Temp 97.9°F | Resp 16 | Wt 225.0 lb

## 2020-01-15 DIAGNOSIS — L03115 Cellulitis of right lower limb: Secondary | ICD-10-CM

## 2020-01-15 DIAGNOSIS — I482 Chronic atrial fibrillation, unspecified: Secondary | ICD-10-CM

## 2020-01-15 DIAGNOSIS — I428 Other cardiomyopathies: Secondary | ICD-10-CM | POA: Diagnosis not present

## 2020-01-15 DIAGNOSIS — F3341 Major depressive disorder, recurrent, in partial remission: Secondary | ICD-10-CM

## 2020-01-15 DIAGNOSIS — I872 Venous insufficiency (chronic) (peripheral): Secondary | ICD-10-CM

## 2020-01-15 DIAGNOSIS — G894 Chronic pain syndrome: Secondary | ICD-10-CM

## 2020-01-15 DIAGNOSIS — M159 Polyosteoarthritis, unspecified: Secondary | ICD-10-CM

## 2020-01-15 DIAGNOSIS — I4821 Permanent atrial fibrillation: Secondary | ICD-10-CM | POA: Diagnosis not present

## 2020-01-15 DIAGNOSIS — M8949 Other hypertrophic osteoarthropathy, multiple sites: Secondary | ICD-10-CM

## 2020-01-15 DIAGNOSIS — F411 Generalized anxiety disorder: Secondary | ICD-10-CM

## 2020-01-15 LAB — POCT INR
INR: 2.2 (ref 2.0–3.0)
PT: 26.4

## 2020-01-15 NOTE — Progress Notes (Signed)
I,Roshena L Chambers,acting as a scribe for Wilhemena Durie, MD.,have documented all relevant documentation on the behalf of Wilhemena Durie, MD,as directed by  Wilhemena Durie, MD while in the presence of Wilhemena Durie, MD.   Established patient visit   Patient: Tracy Mann   DOB: 06-09-1933   86 y.o. Female  MRN: [id number] Visit Date: 01/15/2020  Today's healthcare provider: Wilhemena Durie, MD   Chief Complaint  Patient presents with  . Cellulitis   Subjective    HPI  Follow up ER visit Patient is on day 5 of 10 of clindamycin and her leg is feeling better.  She states she cannot wear the support hose to keep the legs from swelling.  She states it is too much trouble to get support hose on and off. Patient was seen in ER for wound check on 01/09/2020. She was treated for cellulitis of right lower extremity. The patient received a dose of Zosyn in the ED. She was also changed to Clindamycin. Bactroban was givenas topical treatment given the irritated skin in the area. Patient complained of vaginal itch as was empirically prescribed p.o. Diflucan She reports good compliance with treatment. She reports this condition is Improved.  Patient has not taken her Coumadin for the past 3 days because she felt her blood was not right. -----------------------------------------------------------------------------------------  Cellulitis of right lower leg  From 01/09/2020-seen by Laverna Peace. Also seen on 01/02/2020.   Insomnia, unspecified type From 11/12/2019-Consider Belsomra. Patient also saw Vernie Murders for insomnia on 12/16/2019, but there are no notes in chart.  Primary osteoarthritis involving multiple joints From 11/12/2019-given rx for Hydrocodone-acetaminophen (NORCO) 10-325 mg tablet.      Medications: Outpatient Medications Prior to Visit  Medication Sig  . ACCU-CHEK AVIVA PLUS test strip USE WITH METER TO CHECK GLUCOSE BEFORE MEALS  AND AT BEDTIME  . clindamycin (CLEOCIN) 300 MG capsule Take 1 capsule (300 mg total) by mouth 3 (three) times daily for 10 days.  Marland Kitchen estradiol (ESTRACE) 0.1 MG/GM vaginal cream Place 1 Applicatorful vaginally at bedtime.  Marland Kitchen ezetimibe (ZETIA) 10 MG tablet TAKE 1 TABLET BY MOUTH EVERY DAY  . flavoxATE (URISPAS) 100 MG tablet Take 1 tablet (100 mg total) by mouth 2 (two) times daily as needed for bladder spasms.  . fluconazole (DIFLUCAN) 150 MG tablet Take 1 tablet (150 mg total) by mouth once a week for 2 doses.  . fluticasone (FLONASE) 50 MCG/ACT nasal spray SPRAY TWO SPRAYS IN EACH NOSTRIL ONCE DAILY  . HYDROcodone-acetaminophen (NORCO) 10-325 MG tablet Take 1 tablet by mouth every 6 (six) hours as needed for severe pain. Must last 30 days.  Derrill Memo ON 02/09/2020] HYDROcodone-acetaminophen (NORCO) 10-325 MG tablet Take 1 tablet by mouth every 6 (six) hours as needed for severe pain. Must last 30 days.  . insulin glargine (LANTUS SOLOSTAR) 100 UNIT/ML Solostar Pen Inject 20 units nightly between 8-10 PM. Adjust as directed. Max dose is 50 units daily.  . insulin lispro (HUMALOG KWIKPEN) 100 UNIT/ML KwikPen INJECT 10-20 UNITS THREE TIMES DAILY WITH EACH MEAL.  Marland Kitchen Insulin Pen Needle (RELION PEN NEEDLE 31G/8MM) 31G X 8 MM MISC Use with insulin pen three times daily  . levothyroxine (SYNTHROID) 112 MCG tablet Take 1 tablet (112 mcg total) by mouth daily.  Marland Kitchen lisinopril (ZESTRIL) 2.5 MG tablet TAKE 1 TABLET BY MOUTH EVERYDAY AT BEDTIME  . LORazepam (ATIVAN) 1 MG tablet TAKE 1 TABLET (1 MG TOTAL) BY MOUTH 2 (TWO)  TIMES DAILY AS NEEDED.  . Magnesium 500 MG CAPS Take 1,000 mg by mouth daily.   . metolazone (ZAROXOLYN) 2.5 MG tablet Take 1 tablet (2.5 mg total) by mouth 2 (two) times a week. TAKE 30 minutes before Torsemide  . mupirocin ointment (BACTROBAN) 2 % Apply topically daily.  . nitroGLYCERIN (NITROSTAT) 0.4 MG SL tablet Place 1 tablet (0.4 mg total) under the tongue every 5 (five) minutes as needed for  chest pain.  . NONFORMULARY OR COMPOUNDED ITEM Diclofenac/Baclofen/Bu/Gabapentin Compounded Cream  . NONFORMULARY OR COMPOUNDED ITEM See pharmacy note- Not to use on any open skin or wound.  . phenazopyridine (PYRIDIUM) 200 MG tablet Take 1 tablet (200 mg total) by mouth 3 (three) times daily as needed for pain.  . potassium chloride SA (KLOR-CON) 20 MEQ tablet Take 1-2 tablets with metolazone  . torsemide (DEMADEX) 20 MG tablet Take 1 tablet (20 mg total) by mouth daily.  . Vitamin D, Ergocalciferol, (DRISDOL) 50000 units CAPS capsule Take 50,000 Units by mouth. Going to restart  . warfarin (COUMADIN) 4 MG tablet TAKE 1 TABLET BY MOUTH EVERY DAY (Patient taking differently: Pt alter dose everyday. She takes coumadin 4mg  and 3 mg QOD)  . [DISCONTINUED] amoxicillin-clavulanate (AUGMENTIN) 875-125 MG tablet Take 1 tablet by mouth 2 (two) times daily. (Patient not taking: Reported on 01/15/2020)   No facility-administered medications prior to visit.    Review of Systems  Constitutional: Negative for appetite change, chills, fatigue and fever.  Respiratory: Negative for chest tightness and shortness of breath.   Cardiovascular: Negative for chest pain and palpitations.  Gastrointestinal: Positive for abdominal pain (left upper quadrant). Negative for nausea and vomiting.  Skin: Positive for wound (right leg).  Neurological: Negative for dizziness and weakness.       Objective    BP 121/71 (BP Location: Right Arm, Patient Position: Sitting, Cuff Size: Large)   Pulse 88   Temp 97.9 F (36.6 C) (Oral)   Resp 16   Wt 225 lb (102.1 kg)   SpO2 94% Comment: room air  BMI 37.44 kg/m  BP Readings from Last 3 Encounters:  01/15/20 121/71  01/09/20 (!) 130/51  01/09/20 110/69   Wt Readings from Last 3 Encounters:  01/15/20 225 lb (102.1 kg)  01/09/20 220 lb (99.8 kg)  01/09/20 222 lb 12.8 oz (101.1 kg)      Physical Exam Vitals reviewed.  Constitutional:      General: She is not in  acute distress.    Appearance: She is well-developed. She is obese.  HENT:     Head: Normocephalic and atraumatic.     Right Ear: Hearing and external ear normal.     Left Ear: Hearing and external ear normal.     Nose: Nose normal.  Eyes:     General: Lids are normal. No scleral icterus.       Right eye: No discharge.        Left eye: No discharge.     Conjunctiva/sclera: Conjunctivae normal.  Neck:     Thyroid: No thyromegaly.  Cardiovascular:     Rate and Rhythm: Normal rate and regular rhythm.     Heart sounds: Normal heart sounds.  Pulmonary:     Effort: Pulmonary effort is normal. No respiratory distress.  Abdominal:     Palpations: Abdomen is soft.     Tenderness: There is no right CVA tenderness or left CVA tenderness.  Musculoskeletal:     Right lower leg: Edema present.  Left lower leg: Edema present.     Comments: 1+ LE  edema.  Skin:    General: Skin is warm and dry.     Findings: No lesion.     Comments: She has mild erythema up the lower half to two thirds of the anterior right leg.  This is improving.  There is no weeping today.  Neurological:     General: No focal deficit present.     Mental Status: She is alert and oriented to person, place, and time. Mental status is at baseline.  Psychiatric:        Mood and Affect: Mood normal.        Speech: Speech normal.        Behavior: Behavior normal.        Thought Content: Thought content normal.        Judgment: Judgment normal.       Results for orders placed or performed in visit on 01/15/20  POCT INR  Result Value Ref Range   INR 2.2 2.0 - 3.0   PT 26.4     Assessment & Plan     1. Chronic atrial fibrillation (HCC) INR was 2.2 today. Will have patient alternate between 3mg  and 4mg  daily. Recheck in 1-2 weeks. . Patient instructed not to adjust the dose herself. - POCT INR   2. Cellulitis of right lower leg Shock clindamycin I will see her back in a month.  She is instructed to keep her legs  elevated when she is not up and moving.  3. Chronic venous insufficiency She really needs to wear support hose.  Diurese as appropriate. Loss would also help.  4. Mixed Ischemic/Nonischemic Cardiomyopathy   5. Permanent atrial fibrillation (HCC) On Coumadin.  6. Osteoarthritis involving multiple joints   7. Anxiety, generalized Neck ongoing issue.  8. Recurrent major depressive disorder, in partial remission ([place])   9. Chronic pain syndrome 10 you to cut back on narcotics.  Return in about 1 month (around 02/15/2020).         Pinkie Manger Cranford Mon, MD  [facility] [phone] (phone) [phone] (fax)  [place]

## 2020-01-15 NOTE — Patient Instructions (Addendum)
Description   Dx: Atrial Fibrillation Current Coumadin dose: For the past week patient has taking 3mg 's on some days anf 4mg  on others. She has not had any coumadin in 2 days.  PT: 26.4 INR: 2.2 Today's changes: Alternate between 3mg  and 4mg  tablets daily Recheck: 1-2 weeks

## 2020-01-27 ENCOUNTER — Telehealth: Payer: Self-pay | Admitting: Cardiovascular Disease

## 2020-01-27 NOTE — Telephone Encounter (Signed)
Spoke with patient and she stated that she has been very SOB and cannot walk more than 4 steps without feeling like passing out. Her heart rate feels very sporadic and is running between 50-101. She states her O2 is jumping around with her rate between 83-89.  Patient stated that she took an extra half a dose of her Metoprolol today. She requested to come into clinic to be seen and I informed her that we have no available appointments until next week. I also advised with her symptoms that we recommend that she go to the ED to be worked up. She stated with her low O2 saturation she also feels she is getting confused. She does not want to go to the ER as last time she went on 12/10 she waited for hours and she is afraid of getting Covid. I reviewed the risks with her of not seeking treatment and if her INR is not therapeutic as she stated it was low at last check that she is at higher risk of developing a clot and having a stroke or heart attack. Patient still does not want to go to the ER at this time.  I spoke with Dr. Mariah Milling, he recommended that she take an extra dose of her torsemide to help with her SOB. I called the patient back and she stated that she did not take it this morning as she was getting her hair done. She stated that she would take 40 MG now. She was very grateful for the call back.

## 2020-01-27 NOTE — Telephone Encounter (Signed)
°  Pt c/o Shortness Of Breath: STAT if SOB developed within the last 24 hours or pt is noticeably SOB on the phone  1. Are you currently SOB (can you hear that pt is SOB on the phone)? no  2. How long have you been experiencing SOB? A month   3. Are you SOB when sitting or when up moving around? moving around  4. Are you currently experiencing any other symptoms? Very weak, HR has been jumping from 50-101, oxygen earlier today was 83, now 89.  Feels like her legs will give out.

## 2020-01-28 ENCOUNTER — Ambulatory Visit (INDEPENDENT_AMBULATORY_CARE_PROVIDER_SITE_OTHER): Payer: Medicare Other

## 2020-01-28 ENCOUNTER — Other Ambulatory Visit: Payer: Self-pay

## 2020-01-28 DIAGNOSIS — I482 Chronic atrial fibrillation, unspecified: Secondary | ICD-10-CM | POA: Diagnosis not present

## 2020-01-28 LAB — POCT INR
INR: 2.8 (ref 2.0–3.0)
PT: 34.1

## 2020-01-28 NOTE — Patient Instructions (Addendum)
  Description   Dx: Atrial Fibrillation Current Coumadin dose: Alternate 3 mg and 4 mg each day.  F/u in 2 weeks

## 2020-01-28 NOTE — Telephone Encounter (Signed)
Spoke with patient and she stated that she is feeling a little better but would really just like to be seen. I offered appointments with Eula Listen or Gillian Shields but patient said she would wait for her follow up with Dr. Mariah Milling.

## 2020-01-28 NOTE — Telephone Encounter (Signed)
Patient is returning your call from yesterday.

## 2020-02-04 ENCOUNTER — Other Ambulatory Visit: Payer: Self-pay | Admitting: Cardiovascular Disease

## 2020-02-09 ENCOUNTER — Other Ambulatory Visit: Payer: Self-pay | Admitting: Family Medicine

## 2020-02-09 NOTE — Telephone Encounter (Signed)
Copied from [place] [id number]. Topic: Quick Communication - Rx Refill/Question >> Feb 09, 2020  2:09 PM [name] A wrote: Medication: LORazepam (ATIVAN) - has at least 3 left  Has the patient contacted their pharmacy? - Patient was encouraged to contact PCP  Preferred Pharmacy (with phone number or street name): [id number] - [place], [place] [address]  Phone: [phone]  Agent: Please be advised that RX refills may take up to 3 business days. We ask that you follow-up with your pharmacy.

## 2020-02-09 NOTE — Telephone Encounter (Signed)
Requested medication (s) are due for refill today: yes  Requested medication (s) are on the active medication list: yes  Last refill:  11/27/2019  Future visit scheduled: yes  Notes to clinic:  this refill cannot be delegated    Requested Prescriptions  Pending Prescriptions Disp Refills   LORazepam (ATIVAN) 1 MG tablet 60 tablet 1    Sig: Take 1 tablet (1 mg total) by mouth 2 (two) times daily as needed.      Not Delegated - Psychiatry:  Anxiolytics/Hypnotics Failed - 02/09/2020  3:09 PM      Failed - This refill cannot be delegated      Failed - Urine Drug Screen completed in last 360 days      Passed - Valid encounter within last 6 months    Recent Outpatient Visits           3 weeks ago Cellulitis of right lower leg   [place]., MD   1 month ago Chronic atrial fibrillation [facility])   [place], FNP   1 month ago Shortness of breath   [employer] [place], Kelby Aline, FNP   1 month ago Chronic venous insufficiency   [facility] [place], Kelby Aline, FNP   1 month ago Sleep disturbance   [place], PA-C       Future Appointments             In 1 week Jerrol Banana., MD [facility], [place]   In 1 month [place], Kathlene November, MD [facility], [place]

## 2020-02-10 ENCOUNTER — Other Ambulatory Visit: Payer: Self-pay | Admitting: Family Medicine

## 2020-02-10 NOTE — Telephone Encounter (Signed)
Requested medication (s) are due for refill today: no  Requested medication (s) are on the active medication list: yes  Last refill:  12/31/2019  Future visit scheduled: yes  Notes to clinic: this refill cannot be delegated    Requested Prescriptions  Pending Prescriptions Disp Refills   LORazepam (ATIVAN) 1 MG tablet [Pharmacy Med Name: LORAZEPAM 1 MG TABLET] 60 tablet 1    Sig: TAKE 1 TABLET BY MOUTH 2 TIMES DAILY AS NEEDED.      Not Delegated - Psychiatry:  Anxiolytics/Hypnotics Failed - 02/10/2020  9:57 AM      Failed - This refill cannot be delegated      Failed - Urine Drug Screen completed in last 360 days      Passed - Valid encounter within last 6 months    Recent Outpatient Visits           3 weeks ago Cellulitis of right lower leg   [facility] Tracy Banana., MD   1 month ago Chronic atrial fibrillation [facility])   [place], FNP   1 month ago Shortness of breath   [facility], Tracy Aline, FNP   1 month ago Chronic venous insufficiency   [facility] Mann, Tracy Aline, FNP   1 month ago Sleep disturbance   [place], PA-C       Future Appointments             In 1 week Tracy Banana., MD [facility], [place]   In 1 month Rosman, Kathlene November, MD [facility], [place]

## 2020-02-11 ENCOUNTER — Ambulatory Visit: Payer: Self-pay

## 2020-02-11 MED ORDER — LORAZEPAM 1 MG PO TABS: 1.0000 mg | ORAL_TABLET | Freq: Two times a day (BID) | ORAL | 1 refills | Status: DC | PRN

## 2020-02-16 ENCOUNTER — Telehealth: Payer: Self-pay

## 2020-02-16 NOTE — Telephone Encounter (Signed)
Copied from [place] [id number]. Topic: General - Other >> Feb 16, 2020 10:22 AM Yvette Rack wrote: Reason for CRM: Pt stated she will not be able to make the appt on 02/18/20 due to weather and possibly having Covid. Pt requests that the appt be cancelled

## 2020-02-17 ENCOUNTER — Ambulatory Visit: Payer: Self-pay | Admitting: Family Medicine

## 2020-02-17 NOTE — Telephone Encounter (Signed)
Appt canceled!

## 2020-02-18 ENCOUNTER — Ambulatory Visit: Payer: Self-pay

## 2020-02-18 ENCOUNTER — Encounter: Payer: Self-pay | Admitting: Family Medicine

## 2020-02-18 ENCOUNTER — Telehealth (INDEPENDENT_AMBULATORY_CARE_PROVIDER_SITE_OTHER): Payer: Medicare Other | Admitting: Family Medicine

## 2020-02-18 DIAGNOSIS — R110 Nausea: Secondary | ICD-10-CM | POA: Diagnosis not present

## 2020-02-18 DIAGNOSIS — J0140 Acute pansinusitis, unspecified: Secondary | ICD-10-CM

## 2020-02-18 DIAGNOSIS — R059 Cough, unspecified: Secondary | ICD-10-CM | POA: Diagnosis not present

## 2020-02-18 DIAGNOSIS — J069 Acute upper respiratory infection, unspecified: Secondary | ICD-10-CM | POA: Diagnosis not present

## 2020-02-18 MED ORDER — LEVOFLOXACIN 500 MG PO TABS: 500.0000 mg | ORAL_TABLET | Freq: Every day | ORAL | 0 refills | Status: DC

## 2020-02-18 MED ORDER — ONDANSETRON HCL 4 MG PO TABS: 4.0000 mg | ORAL_TABLET | Freq: Three times a day (TID) | ORAL | 1 refills | Status: DC | PRN

## 2020-02-18 NOTE — Progress Notes (Signed)
I,April Miller,acting as a scribe for Wilhemena Durie, MD.,have documented all relevant documentation on the behalf of Wilhemena Durie, MD,as directed by  Wilhemena Durie, MD while in the presence of Wilhemena Durie, MD.  Virtual telephone visit    Virtual Visit via Telephone Note   This visit type was conducted due to national recommendations for restrictions regarding the COVID-19 Pandemic (e.g. social distancing) in an effort to limit this patient's exposure and mitigate transmission in our community. Due to her co-morbid illnesses, this patient is at least at moderate risk for complications without adequate follow up. This format is felt to be most appropriate for this patient at this time. The patient did not have access to video technology or had technical difficulties with video requiring transitioning to audio format only (telephone). Physical exam was limited to content and character of the telephone converstion.    Patient location: home Provider location: office  I discussed the limitations of evaluation and management by telemedicine and the availability of in person appointments. The patient expressed understanding and agreed to proceed.   Visit Date: 02/18/2020  Today's healthcare provider: Wilhemena Durie, MD   Chief Complaint  Patient presents with  . Fever  . Fatigue  . Diarrhea  . Dysuria   Subjective    HPI  Patient complains of being exposed to COVID 10 days ago after which she developed terms of nausea cough weight loss and dysuria.  She had 1 day where she did was not breathing well and she states she has had chills and a fever T-max of 101.4.  Over the past few days again she has developed dysuria and think she has urinary tract infection also.  She has multiple drug allergies.  Again, no shortness of breath recently.       Medications: Outpatient Medications Prior to Visit  Medication Sig  . ACCU-CHEK AVIVA PLUS test strip USE WITH  METER TO CHECK GLUCOSE BEFORE MEALS AND AT BEDTIME  . estradiol (ESTRACE) 0.1 MG/GM vaginal cream Place 1 Applicatorful vaginally at bedtime.  Marland Kitchen ezetimibe (ZETIA) 10 MG tablet TAKE 1 TABLET BY MOUTH EVERY DAY  . flavoxATE (URISPAS) 100 MG tablet Take 1 tablet (100 mg total) by mouth 2 (two) times daily as needed for bladder spasms.  . fluticasone (FLONASE) 50 MCG/ACT nasal spray SPRAY TWO SPRAYS IN EACH NOSTRIL ONCE DAILY  . HYDROcodone-acetaminophen (NORCO) 10-325 MG tablet Take 1 tablet by mouth every 6 (six) hours as needed for severe pain. Must last 30 days.  . insulin glargine (LANTUS SOLOSTAR) 100 UNIT/ML Solostar Pen Inject 20 units nightly between 8-10 PM. Adjust as directed. Max dose is 50 units daily.  . insulin lispro (HUMALOG KWIKPEN) 100 UNIT/ML KwikPen INJECT 10-20 UNITS THREE TIMES DAILY WITH EACH MEAL.  Marland Kitchen Insulin Pen Needle (RELION PEN NEEDLE 31G/8MM) 31G X 8 MM MISC Use with insulin pen three times daily  . levothyroxine (SYNTHROID) 112 MCG tablet Take 1 tablet (112 mcg total) by mouth daily.  Marland Kitchen lisinopril (ZESTRIL) 2.5 MG tablet TAKE 1 TABLET BY MOUTH EVERYDAY AT BEDTIME  . LORazepam (ATIVAN) 1 MG tablet Take 1 tablet (1 mg total) by mouth 2 (two) times daily as needed.  . Magnesium 500 MG CAPS Take 1,000 mg by mouth daily.   . metolazone (ZAROXOLYN) 2.5 MG tablet Take 1 tablet (2.5 mg total) by mouth 2 (two) times a week. TAKE 30 minutes before Torsemide  . mupirocin ointment (BACTROBAN) 2 % Apply topically daily.  Marland Kitchen  nitroGLYCERIN (NITROSTAT) 0.4 MG SL tablet PLACE 1 TABLET (0.4 MG TOTAL) UNDER THE TONGUE EVERY 5 (FIVE) MINUTES AS NEEDED FOR CHEST PAIN.  . NONFORMULARY OR COMPOUNDED ITEM Diclofenac/Baclofen/Bu/Gabapentin Compounded Cream  . NONFORMULARY OR COMPOUNDED ITEM See pharmacy note- Not to use on any open skin or wound.  . phenazopyridine (PYRIDIUM) 200 MG tablet Take 1 tablet (200 mg total) by mouth 3 (three) times daily as needed for pain.  . potassium chloride SA  (KLOR-CON) 20 MEQ tablet Take 1-2 tablets with metolazone  . torsemide (DEMADEX) 20 MG tablet Take 1 tablet (20 mg total) by mouth daily.  . Vitamin D, Ergocalciferol, (DRISDOL) 50000 units CAPS capsule Take 50,000 Units by mouth. Going to restart  . warfarin (COUMADIN) 4 MG tablet TAKE 1 TABLET BY MOUTH EVERY DAY (Patient taking differently: Pt alter dose everyday. She takes coumadin 4mg  and 3 mg QOD)  . HYDROcodone-acetaminophen (NORCO) 10-325 MG tablet Take 1 tablet by mouth every 6 (six) hours as needed for severe pain. Must last 30 days.   No facility-administered medications prior to visit.    Review of Systems  Constitutional: Positive for appetite change, chills, fatigue, fever and unexpected weight change.  HENT: Positive for congestion.   Respiratory: Negative for chest tightness and shortness of breath.   Cardiovascular: Negative for chest pain and palpitations.  Gastrointestinal: Positive for abdominal pain, diarrhea and nausea. Negative for vomiting.  Genitourinary: Positive for dysuria.  Neurological: Positive for weakness. Negative for dizziness.       Objective    SpO2 (!) 88%    She is alert and oriented.  She completes sentences and has no shortness of breath on the phone.  She is not audibly wheezing.   Assessment & Plan     1. Nausea Sent in ondansetron to help her keep down fluids.  2. Cough Check for COVID.  We will arrange this for tomorrow.  [place] clinic already done today.  3. Viral upper respiratory tract infection  - COVID-19, Flu A+B and RSV  4. Subacute pansinusitis Cover sinusitis and potential UTI with Levaquin 500 mg daily for 5 days.  I would rather not use antibiotics but patient states she is unable to do anything. - COVID-19, Flu A+B and RSV   No follow-ups on file.    I discussed the assessment and treatment plan with the patient. The patient was provided an opportunity to ask questions and all were answered. The patient agreed with  the plan and demonstrated an understanding of the instructions.   The patient was advised to call back or seek an in-person evaluation if the symptoms worsen or if the condition fails to improve as anticipated.  I provided 12 minutes of non-face-to-face time during this encounter.  I, Wilhemena Durie, MD, have reviewed all documentation for this visit. The documentation on 02/18/20 for the exam, diagnosis, procedures, and orders are all accurate and complete.   Richard Cranford Mon, MD [facility] [phone] (phone) [phone] (fax)  [place]

## 2020-02-19 DIAGNOSIS — J069 Acute upper respiratory infection, unspecified: Secondary | ICD-10-CM | POA: Diagnosis not present

## 2020-02-21 ENCOUNTER — Other Ambulatory Visit: Payer: Self-pay | Admitting: Family Medicine

## 2020-02-21 DIAGNOSIS — U071 COVID-19: Secondary | ICD-10-CM

## 2020-02-21 LAB — COVID-19, FLU A+B AND RSV
Influenza A, NAA: NOT DETECTED
Influenza B, NAA: NOT DETECTED
RSV, NAA: NOT DETECTED
SARS-CoV-2, NAA: DETECTED — AB

## 2020-02-21 LAB — SPECIMEN STATUS REPORT

## 2020-02-21 NOTE — Progress Notes (Signed)
covid infection

## 2020-02-22 ENCOUNTER — Telehealth: Payer: Self-pay | Admitting: Physician Assistant

## 2020-02-22 NOTE — Telephone Encounter (Signed)
Called to discuss with [name] about Covid symptoms and the use of sotrovimab, remdisivir or oral therapies for those with mild to moderate Covid symptoms and at a high risk of hospitalization.     Pt does not qualify as pt's symptoms first presented > 10 days prior to timing of infusion. Symptoms tier reviewed as well as criteria for ending isolation. Preventative practices reviewed. Patient verbalized understanding    Patient Active Problem List   Diagnosis Date Noted  . Subtherapeutic anticoagulation 01/09/2020  . Confusion 01/09/2020  . Sleepiness 01/09/2020  . Bradycardia 01/09/2020  . Bilateral lower extremity edema 01/02/2020  . Wound cellulitis 01/02/2020  . Cellulitis of right lower leg 01/02/2020  . Elevated brain natriuretic peptide (BNP) level 01/02/2020  . Chronic hip pain (2ry area of Pain) (Bilateral) (R>L) 12/09/2019  . Osteoarthritis involving multiple joints 12/09/2019  . Osteoarthritis of knee (Right) 12/09/2019  . Osteoarthritis of facet joint of lumbar spine 12/09/2019  . Osteoarthritis of lumbar spine 12/09/2019  . Pharmacologic therapy 12/08/2019  . Lumbosacral facet syndrome 12/08/2019  . Lumbar lateral recess stenosis (L4-5) (Right) 11/26/2019  . Chronic low back pain (Bilateral) w/ sciatica (Bilateral) 11/26/2019  . Lumbosacral radiculopathy at L5 (Bilateral) 11/26/2019  . Chronic lower extremity pain (1ry area of Pain) (Bilateral) 11/26/2019  . Hypoglycemia associated with diabetes ([place]) 11/26/2019  . History of tachycardia 11/25/2019  . Chronic pain syndrome 11/25/2019  . Disorder of skeletal system 11/25/2019  . Problems influencing health status 11/25/2019  . Abnormal MRI, lumbar spine (08/22/2019) 11/25/2019  . Lumbar facet hypertrophy (Multilevel) (Bilateral) 11/25/2019  . Lumbar central spinal stenosis (SEVERE) (L3-4) with neurogenic claudication 11/25/2019  . Grade 1 Anterolisthesis of lumbar spine (L4/L5) 11/25/2019  . Lumbar facet  arthropathy (L3-4 right-sided facet edema/joint effusion) 11/25/2019  . Chronic venous insufficiency 07/27/2019  . Statin intolerance 06/29/2019  . Lymphedema 06/25/2019  . Vitamin D deficiency 05/21/2019  . Angina pectoris ([place]) 06/07/2015  . Angina at rest [facility]) 05/20/2015  . Fatigue 03/03/2015  . Mixed Ischemic/Nonischemic Cardiomyopathy   . Chronic atrial fibrillation ([place])   . Chronic anticoagulation (Coumadin)   . Airway hyperreactivity 07/08/2014  . Cervical muscle strain 07/08/2014  . PNA (pneumonia) 07/08/2014  . Chest pressure 07/08/2014  . Clinical depression 07/08/2014  . DDD (degenerative disc disease), lumbosacral 07/08/2014  . Accumulation of fluid in tissues 07/08/2014  . Anxiety, generalized 07/08/2014  . Acid reflux 07/08/2014  . Folliculitis 22/03/5425  . Broken leg 07/08/2014  . Cannot sleep 07/08/2014  . Obstructive sleep apnea 07/08/2014  . Onychia of finger 07/08/2014  . Psoriasis 07/08/2014  . Restless legs syndrome 07/08/2014  . Adult BMI 30+ 07/08/2014  . Hypothyroidism 09/04/2013  . Type 2 diabetes mellitus without complications ([place]) 07/23/7626  . Calculus of kidney 10/31/2011  . Bladder infection, chronic 10/31/2011  . Incomplete bladder emptying 10/31/2011  . Obesity 11/02/2010  . Shortness of breath 02/01/2010  . Chronic diastolic heart failure ([place]) 02/08/2009  . Hyperlipidemia 09/16/2008  . CAD, NATIVE VESSEL 09/16/2008  . Atrial fibrillation ([place]) 09/16/2008  . Benign hypertensive heart disease without congestive heart failure 09/16/2008    Angelena Form PA-C

## 2020-02-24 ENCOUNTER — Telehealth: Payer: Medicare Other | Admitting: Family Medicine

## 2020-02-26 ENCOUNTER — Telehealth (INDEPENDENT_AMBULATORY_CARE_PROVIDER_SITE_OTHER): Payer: Medicare Other | Admitting: Family Medicine

## 2020-02-26 ENCOUNTER — Ambulatory Visit: Payer: Self-pay | Admitting: *Deleted

## 2020-02-26 ENCOUNTER — Other Ambulatory Visit: Payer: Self-pay

## 2020-02-26 ENCOUNTER — Encounter: Payer: Self-pay | Admitting: Family Medicine

## 2020-02-26 DIAGNOSIS — Z8616 Personal history of COVID-19: Secondary | ICD-10-CM | POA: Diagnosis not present

## 2020-02-26 DIAGNOSIS — R5383 Other fatigue: Secondary | ICD-10-CM | POA: Diagnosis not present

## 2020-02-26 DIAGNOSIS — R059 Cough, unspecified: Secondary | ICD-10-CM

## 2020-02-26 DIAGNOSIS — R5381 Other malaise: Secondary | ICD-10-CM | POA: Diagnosis not present

## 2020-02-26 MED ORDER — ALBUTEROL SULFATE HFA 108 (90 BASE) MCG/ACT IN AERS
2.0000 | INHALATION_SPRAY | Freq: Four times a day (QID) | RESPIRATORY_TRACT | 2 refills | Status: DC | PRN
Start: 2020-02-26 — End: 2020-03-19

## 2020-02-26 NOTE — Progress Notes (Signed)
Virtual telephone visit    Virtual Visit via Telephone Note   This visit type was conducted due to national recommendations for restrictions regarding the COVID-19 Pandemic (e.g. social distancing) in an effort to limit this patient's exposure and mitigate transmission in our community. Due to her co-morbid illnesses, this patient is at least at moderate risk for complications without adequate follow up. This format is felt to be most appropriate for this patient at this time. The patient did not have access to video technology or had technical difficulties with video requiring transitioning to audio format only (telephone). Physical exam was limited to content and character of the telephone converstion.    Patient location: home Provider location: office  I discussed the limitations of evaluation and management by telemedicine and the availability of in person appointments. The patient expressed understanding and agreed to proceed.   Visit Date: 02/26/2020  Today's healthcare provider: Vernie Murders, PA-C   No chief complaint on file.  Subjective    HPI   Patient is an 87 year old female who presents via phone visit for evaluation of continued  Covid symptoms. She states that her symptoms began on 02/09/20.  She tested positive for Covid on 02/19/20. She states she still has cough with production of green phlegm.  She is very fatigued and feels like her lungs have been affected by the virus. Patient says that she has days where she thinks she is getting better and then the next day she feels terrible.   It is of note that the patient is diabetic and also she is on Coumadin.  Past Medical History:  Diagnosis Date  . Acute myocardial infarction of inferior wall ([place])    a. 10/2005 - Presumed to be 2/2 to a coronary embolus in setting of persistent Afib-->Cath 2014 relatively nl cors, EF 45-50% w/ severe distal inferior/apical inferior HK w/ aneursymal appearance.  . Arthritis    . Chronic anticoagulation    a. warfarin.  . Chronic atrial fibrillation (H[facility])    a. CHA2DS2VASc = 6--> warfarin.  . Chronic combined systolic and diastolic CHF (congestive heart failure) ([place])    a. 10/2012 Echo: EF 35-40%, diff HK, mild MR, mild biatrial enlargement;  b. 11/2012 EF 45-50% by LV gram; c. echo 07/2014: EF 40-45%, mod conc LVH, mod MR, severe biatrial enlargement, PASP 45 mm Hg c. Echo 01/2018 EF 45-50%, duffuse hypokinesis, mild MR, LA mod dilated, norm RVSF, dilated IVC  . Essential hypertension   . Hyperlipidemia, mixed   . Mixed Ischemic/Nonischemic Cardiomyopathy    a. 10/2012 Echo: EF 35-40%;  b. 11/2012 EF 45-50% by LV gram.  . Nonobstructive coronary artery disease    a. 2007 inferior wall MI thought to be secondary to thrombus from atrial fib b. 2014 cath with R dominant coronary arterial system with mild luminal irregularities c. Myoview 05/2015 old inferior MI, no new ischemic changes  . Obesity   . Thyroid disease    hypothyroidism  . Type II diabetes mellitus ([place])    a. 07/2018 A1c 8.7 - long term insulin use  . Venous insufficiency   . Vitamin D deficiency    Past Surgical History:  Procedure Laterality Date  . CARDIAC CATHETERIZATION  11/2012   [facility];EF 45-50%  . CHOLECYSTECTOMY  1999  . TUBAL LIGATION  1968  . VESICOVAGINAL FISTULA CLOSURE W/ TAH  1996   Family History  Problem Relation Age of Onset  . Heart disease Mother   . Thyroid disease  Father    Social History   Tobacco Use  . Smoking status: Never Smoker  . Smokeless tobacco: Never Used  Vaping Use  . Vaping Use: Never used  Substance Use Topics  . Alcohol use: No  . Drug use: No   Allergies  Allergen Reactions  . Duloxetine Other (See Comments)    Same as Paroxetine  . Other Anaphylaxis and Swelling    'tongue swelling'  . Paroxetine Other (See Comments)    "Horrible headache," Stomach pain, Diarrhea  . Sulfa Antibiotics Anaphylaxis  . Cephalexin     Blisters  .  Clarithromycin Other (See Comments)    Hives, headaches, hard time swelling, felt like throat was closing up Hives, headaches, hard time swelling, felt like throat was closing up  . Diphenhydramine Other (See Comments)  . Estrogens Conjugated   . Estrogens, Conjugated Other (See Comments)  . Sulfonamide Derivatives Swelling    'tongue swelling'  . Nitrofurantoin Nausea Only   Medications: Outpatient Medications Prior to Visit  Medication Sig  . ACCU-CHEK AVIVA PLUS test strip USE WITH METER TO CHECK GLUCOSE BEFORE MEALS AND AT BEDTIME  . estradiol (ESTRACE) 0.1 MG/GM vaginal cream Place 1 Applicatorful vaginally at bedtime.  Marland Kitchen ezetimibe (ZETIA) 10 MG tablet TAKE 1 TABLET BY MOUTH EVERY DAY  . flavoxATE (URISPAS) 100 MG tablet Take 1 tablet (100 mg total) by mouth 2 (two) times daily as needed for bladder spasms.  . fluticasone (FLONASE) 50 MCG/ACT nasal spray SPRAY TWO SPRAYS IN EACH NOSTRIL ONCE DAILY  . HYDROcodone-acetaminophen (NORCO) 10-325 MG tablet Take 1 tablet by mouth every 6 (six) hours as needed for severe pain. Must last 30 days.  Marland Kitchen HYDROcodone-acetaminophen (NORCO) 10-325 MG tablet Take 1 tablet by mouth every 6 (six) hours as needed for severe pain. Must last 30 days.  . insulin glargine (LANTUS SOLOSTAR) 100 UNIT/ML Solostar Pen Inject 20 units nightly between 8-10 PM. Adjust as directed. Max dose is 50 units daily.  . insulin lispro (HUMALOG KWIKPEN) 100 UNIT/ML KwikPen INJECT 10-20 UNITS THREE TIMES DAILY WITH EACH MEAL.  Marland Kitchen Insulin Pen Needle (RELION PEN NEEDLE 31G/8MM) 31G X 8 MM MISC Use with insulin pen three times daily  . levofloxacin (LEVAQUIN) 500 MG tablet Take 1 tablet (500 mg total) by mouth daily.  Marland Kitchen levothyroxine (SYNTHROID) 112 MCG tablet Take 1 tablet (112 mcg total) by mouth daily.  Marland Kitchen lisinopril (ZESTRIL) 2.5 MG tablet TAKE 1 TABLET BY MOUTH EVERYDAY AT BEDTIME  . LORazepam (ATIVAN) 1 MG tablet Take 1 tablet (1 mg total) by mouth 2 (two) times daily as  needed.  . Magnesium 500 MG CAPS Take 1,000 mg by mouth daily.   . metolazone (ZAROXOLYN) 2.5 MG tablet Take 1 tablet (2.5 mg total) by mouth 2 (two) times a week. TAKE 30 minutes before Torsemide  . mupirocin ointment (BACTROBAN) 2 % Apply topically daily.  . nitroGLYCERIN (NITROSTAT) 0.4 MG SL tablet PLACE 1 TABLET (0.4 MG TOTAL) UNDER THE TONGUE EVERY 5 (FIVE) MINUTES AS NEEDED FOR CHEST PAIN.  . NONFORMULARY OR COMPOUNDED ITEM Diclofenac/Baclofen/Bu/Gabapentin Compounded Cream  . NONFORMULARY OR COMPOUNDED ITEM See pharmacy note- Not to use on any open skin or wound.  . ondansetron (ZOFRAN) 4 MG tablet Take 1 tablet (4 mg total) by mouth every 8 (eight) hours as needed for nausea or vomiting.  . phenazopyridine (PYRIDIUM) 200 MG tablet Take 1 tablet (200 mg total) by mouth 3 (three) times daily as needed for pain.  . potassium chloride  SA (KLOR-CON) 20 MEQ tablet Take 1-2 tablets with metolazone  . torsemide (DEMADEX) 20 MG tablet Take 1 tablet (20 mg total) by mouth daily.  . Vitamin D, Ergocalciferol, (DRISDOL) 50000 units CAPS capsule Take 50,000 Units by mouth. Going to restart  . warfarin (COUMADIN) 4 MG tablet TAKE 1 TABLET BY MOUTH EVERY DAY (Patient taking differently: Pt alter dose everyday. She takes coumadin 4mg  and 3 mg QOD)   No facility-administered medications prior to visit.    Review of Systems  Constitutional: Positive for fatigue. Negative for fever (Takes Tylenol every day so not sure if she would have fever now.).  HENT: Positive for rhinorrhea. Negative for congestion, sinus pressure, sinus pain and sore throat.   Respiratory: Positive for cough and shortness of breath. Negative for wheezing.   Gastrointestinal: Positive for abdominal pain and diarrhea.  Musculoskeletal: Positive for arthralgias (on and off).  Neurological: Positive for dizziness and weakness. Negative for headaches.      Objective    There were no vitals taken for this visit.     Assessment  & Plan     1. Malaise and fatigue Developed generalized fatigue and malaise since positive COVID test on 02-19-20. No fever now. Sense of taste is diminished.   2. Cough Occasional cough that does not cause sputum production. States she gets some shortness of breath and pulse oximetry dropped once to 77%. Rest helped it rebound to normal. No fever now. Will add Albuterol Inhaler for dyspnea or wheeze. Advised to go to the ER if dyspnea and pulse ox drops again.  - albuterol (VENTOLIN HFA) 108 (90 Base) MCG/ACT inhaler; Inhale 2 puffs into the lungs every 6 (six) hours as needed for wheezing or shortness of breath.  Dispense: 8 g; Refill: 2  3. Personal history of COVID-19 Had had 2 COVID vaccinations with positive COVID test on 02-19-20. Maintain COVID restrictions and monitor dyspnea.   No follow-ups on file.    I discussed the assessment and treatment plan with the patient. The patient was provided an opportunity to ask questions and all were answered. The patient agreed with the plan and demonstrated an understanding of the instructions.   The patient was advised to call back or seek an in-person evaluation if the symptoms worsen or if the condition fails to improve as anticipated.  I provided 20 minutes of non-face-to-face time during this encounter.  I, Lizandro Spellman, PA-C, have reviewed all documentation for this visit. The documentation on 02/26/20 for the exam, diagnosis, procedures, and orders are all accurate and complete.   Vernie Murders, PA-C [employer] [phone] (phone) [phone] (fax)  [place]

## 2020-02-26 NOTE — Telephone Encounter (Signed)
Patient received her positive Covid test result on Sunday 02/22/20 and would like to discuss with a nurse what to expect. Please call Ph# [phone]  Patient is in her third week of COVID- she is having a hard time with fatigue, still coughing phlegm- clear to green, feels like her lungs have been effected.  Reason for Disposition . HIGH RISK for severe COVID complications (e.g., age > 65 years, obesity with BMI > 25, pregnant, chronic lung disease or other chronic medical condition)  (Exception: Already seen by PCP and no new or worsening symptoms.)  Answer Assessment - Initial Assessment Questions 1. COVID-19 DIAGNOSIS: "Who made your COVID-19 diagnosis?" "Was it confirmed by a positive lab test?" If not diagnosed by a HCP, ask "Are there lots of cases (community spread) where you live?" Note: See public health department website, if unsure.     1/23- positive 2. COVID-19 EXPOSURE: "Was there any known exposure to COVID before the symptoms began?" CDC Definition of close contact: within 6 feet (2 meters) for a total of 15 minutes or more over a 24-hour period.      No none exposure 3. ONSET: "When did the COVID-19 symptoms start?"      1 week ago- patient was sick 1 week before that 4. WORST SYMPTOM: "What is your worst symptom?" (e.g., cough, fever, shortness of breath, muscle aches)     Fatigue- weakness 5. COUGH: "Do you have a cough?" If Yes, ask: "How bad is the cough?"       Yes- is has gotten some better- off/on- mucus- green/clear- patient did see blood in it at first 6. FEVER: "Do you have a fever?" If Yes, ask: "What is your temperature, how was it measured, and when did it start?"     No fever 7. RESPIRATORY STATUS: "Describe your breathing?" (e.g., shortness of breath, wheezing, unable to speak)      Comes and goes- laying down- feels like she is not getting enough air in- O2 sat- 93 8. BETTER-SAME-WORSE: "Are you getting better, staying the same or getting worse compared to  yesterday?"  If getting worse, ask, "In what way?"     Seems to get better- and then feels worse 9. HIGH RISK DISEASE: "Do you have any chronic medical problems?" (e.g., asthma, heart or lung disease, weak immune system, obesity, etc.)     Age, heart- CHF 10. VACCINE: "Have you gotten the COVID-19 vaccine?" If Yes ask: "Which one, how many shots, when did you get it?"       Yes- pfizer  11. PREGNANCY: "Is there any chance you are pregnant?" "When was your last menstrual period?"       n/a 12. OTHER SYMPTOMS: "Do you have any other symptoms?"  (e.g., chills, fatigue, headache, loss of smell or taste, muscle pain, sore throat; new loss of smell or taste especially support the diagnosis of COVID-19)       Fatigue-weakness  Protocols used: CORONAVIRUS (COVID-19) DIAGNOSED OR SUSPECTED-A-AH

## 2020-03-08 NOTE — Progress Notes (Signed)
PROVIDER NOTE: Information contained herein reflects review and annotations entered in association with encounter. Interpretation of such information and data should be left to medically-trained personnel. Information provided to patient can be located elsewhere in the medical record under "Patient Instructions". Document created using STT-dictation technology, any transcriptional errors that may result from process are unintentional.    Patient: Tracy Mann  Service Category: E/M  Provider: Gaspar Cola, MD  DOB: 1933/10/03  DOS: 03/10/2020  Specialty: Interventional Pain Management  MRN: [id number]  Setting: Ambulatory outpatient  PCP: Jerrol Banana., MD  Type: Established Patient    Referring Provider: Jerrol Banana.,*  Location: Office  Delivery: Face-to-face     HPI  Ms. JOSEFITA WEISSMANN, a 87 y.o. year old female, is here today because of her Chronic pain syndrome [G89.4]. Ms. Routzahn primary complain today is Hip Pain Last encounter: My last encounter with her was on 01/05/2020. Pertinent problems: Ms. Baldwin has Cervical muscle strain; DDD (degenerative disc disease), lumbosacral; Chronic venous insufficiency; Chronic pain syndrome; Abnormal MRI, lumbar spine (08/22/2019); Lumbar facet hypertrophy (Multilevel) (Bilateral); Lumbar central spinal stenosis (SEVERE) (L3-4) with neurogenic claudication; Grade 1 Anterolisthesis of lumbar spine (L4/L5); Lumbar facet arthropathy (L3-4 right-sided facet edema/joint effusion); Lumbar lateral recess stenosis (L4-5) (Right); Chronic low back pain (3ry area of Pain) (Bilateral) w/ sciatica (Bilateral); Lumbosacral radiculopathy at L5 (Bilateral); Chronic lower extremity pain (1ry area of Pain) (Bilateral); Lumbosacral facet syndrome; Chronic hip pain (2ry area of Pain) (Bilateral) (R>L); Osteoarthritis involving multiple joints; Osteoarthritis of knee (Right); Osteoarthritis of facet joint of lumbar spine; Osteoarthritis of lumbar spine; and  Chronic knee pain (Bilateral) on their pertinent problem list. Pain Assessment: Severity of Chronic pain is reported as a 2 /10. Location: Hip Right,Left/pain radiaties down down both leg. Onset: More than a month ago. Quality: Aching,Burning,Stabbing,Nagging. Timing: Constant. Modifying factor(s): med, hot bath, heating pad. Vitals:  height is 5' 5" (1.651 m) and weight is 214 lb (97.1 kg). Her temperature is 97.3 F (36.3 C) (abnormal). Her blood pressure is 118/57 (abnormal) and her pulse is 98. Her oxygen saturation is 98%.   Reason for encounter: medication management.   The patient indicates doing well with the current medication regimen. No adverse reactions or side effects reported to the medications.  The patient returns to the clinic today with all of her medication bottles.  This helped significantly since she actually brought 3 bottles and all 3 bottles had some medication left.  I did the 360 pills provided to the patient through 3 different prescriptions, she used 312 of those.  According to the PMP and when she had her first prescription filled, she used the 312 pills over a period 90 days.  This would suggest that that she is using an average of 3.4 pills/day.  Since she still has 48 pills left, this means that she should have enough to last until 03/24/2020.  Her next medication refill should be at that time.  In addition, I will be adjusting her supply so that she gets 100 pills/month.  We will reassess to see how she is doing on her next medication management appointment which should be before 06/22/2020.  RTCB: 06/22/2020  Pharmacotherapy Assessment   Analgesic: Hydrocodone/APAP 10/325, 1 tab p.o. 4 times daily (last filled on 11/12/2019) MME/day: 40 mg/day   Monitoring: [place] PMP: PDMP reviewed during this encounter.       Pharmacotherapy: No side-effects or adverse reactions reported. Compliance: No problems identified. Effectiveness: Clinically acceptable.  Chauncey Fischer, RN   03/10/2020  1:25 PM  Sign when Signing Visit Nursing Pain Medication Assessment:  Safety precautions to be maintained throughout the outpatient stay will include: orient to surroundings, keep bed in low position, maintain call bell within reach at all times, provide assistance with transfer out of bed and ambulation.  Medication Inspection Compliance: Pill count conducted under aseptic conditions, in front of the patient. Neither the pills nor the bottle was removed from the patient's sight at any time. Once count was completed pills were immediately returned to the patient in their original bottle.  Medication: Hydrocodone/APAP Pill/Patch Count: 48 of 120 pills remain Pill/Patch Appearance: Markings consistent with prescribed medication Bottle Appearance: Standard pharmacy container. Clearly labeled. Filled Date: 1 / 36 / 22 Last Medication intake:  TodaySafety precautions to be maintained throughout the outpatient stay will include: orient to surroundings, keep bed in low position, maintain call bell within reach at all times, provide assistance with transfer out of bed and ambulation.     UDS:  Summary  Date Value Ref Range Status  11/26/2019 Note  Final    Comment:    ==================================================================== Compliance Drug Analysis, Ur ==================================================================== Test                             Result       Flag       Units  Drug Present and Declared for Prescription Verification   Lorazepam                      1673         EXPECTED   ng/mg creat    Source of lorazepam is a scheduled prescription medication.    Hydrocodone                    3151         EXPECTED   ng/mg creat   Hydromorphone                  273          EXPECTED   ng/mg creat   Dihydrocodeine                 917          EXPECTED   ng/mg creat   Norhydrocodone                 3806         EXPECTED   ng/mg creat    Sources of hydrocodone include  scheduled prescription medications.    Hydromorphone, dihydrocodeine and norhydrocodone are expected    metabolites of hydrocodone. Hydromorphone and dihydrocodeine are    also available as scheduled prescription medications.    Acetaminophen                  PRESENT      EXPECTED  Drug Absent but Declared for Prescription Verification   Buprenorphine                  Not Detected UNEXPECTED ng/mg creat    Low dose buprenorphine is not always detected even when used as    directed.    Gabapentin                     Not Detected UNEXPECTED    Topical gabapentin, as indicated in the declared medication list,  is    not always detected even when used as directed.    Baclofen                       Not Detected UNEXPECTED   Diclofenac                     Not Detected UNEXPECTED    Topical diclofenac, as indicated in the declared medication list, is    not always detected even when used as directed.    Metoprolol                     Not Detected UNEXPECTED ==================================================================== Test                      Result    Flag   Units      Ref Range   Creatinine              78               mg/dL      >=20 ==================================================================== Declared Medications:  The flagging and interpretation on this report are based on the  following declared medications.  Unexpected results may arise from  inaccuracies in the declared medications.   **Note: The testing scope of this panel includes these medications:   Baclofen  Hydrocodone (Norco)  Lorazepam (Ativan)  Metoprolol (Lopressor)   **Note: The testing scope of this panel does not include small to  moderate amounts of these reported medications:   Acetaminophen (Norco)  Buprenorphine  Topical Diclofenac  Topical Gabapentin   **Note: The testing scope of this panel does not include the  following reported medications:   Estradiol (Estrace)  Ezetimibe  (Zetia)  Flavoxate (Urispas)  Fluticasone (Flonase)  Insulin  Levofloxacin (Levaquin)  Levothyroxine (Synthroid)  Lisinopril (Zestril)  Magnesium  Metolazone (Zaroxolyn)  Nitroglycerin (Nitrostat)  Phenazopyridine (Pyridium)  Potassium (Klor-Con)  Ropinirole (Requip)  Torsemide (Demadex)  Vitamin D2 (Drisdol)  Warfarin (Coumadin) ==================================================================== For clinical consultation, please call [phone]. ====================================================================      ROS  Constitutional: Denies any fever or chills Gastrointestinal: No reported hemesis, hematochezia, vomiting, or acute GI distress Musculoskeletal: Denies any acute onset joint swelling, redness, loss of ROM, or weakness Neurological: No reported episodes of acute onset apraxia, aphasia, dysarthria, agnosia, amnesia, paralysis, loss of coordination, or loss of consciousness  Medication Review  HYDROcodone-acetaminophen, Insulin Pen Needle, LORazepam, Magnesium, NONFORMULARY OR COMPOUNDED ITEM, Vitamin D (Ergocalciferol), albuterol, estradiol, ezetimibe, flavoxATE, fluticasone, glucose blood, insulin glargine, insulin lispro, levofloxacin, levothyroxine, lisinopril, metolazone, mupirocin ointment, nitroGLYCERIN, ondansetron, phenazopyridine, potassium chloride SA, rOPINIRole, torsemide, and warfarin  History Review  Allergy: Ms. Arambula is allergic to duloxetine; other; paroxetine; sulfa antibiotics; cephalexin; clarithromycin; diphenhydramine; estrogens conjugated; estrogens, conjugated; sulfonamide derivatives; and nitrofurantoin. Drug: Ms. Quackenbush  reports no history of drug use. Alcohol:  reports no history of alcohol use. Tobacco:  reports that she has never smoked. She has never used smokeless tobacco. Social: Ms. Lasota  reports that she has never smoked. She has never used smokeless tobacco. She reports that she does not drink alcohol and does not use  drugs. Medical:  has a past medical history of Acute myocardial infarction of inferior wall ([place]), Arthritis, Chronic anticoagulation, Chronic atrial fibrillation ([place]), Chronic combined systolic and diastolic CHF (congestive heart failure) ([place]), Essential hypertension, Hyperlipidemia, mixed, Mixed Ischemic/Nonischemic Cardiomyopathy, Nonobstructive coronary artery disease, Obesity, Thyroid disease, Type II diabetes mellitus ([place]), Venous  insufficiency, and Vitamin D deficiency. Surgical: Ms. Neidlinger  has a past surgical history that includes Vesicovaginal fistula closure w/ TAH (1996); Tubal ligation (1968); Cholecystectomy (1999); and Cardiac catheterization (11/2012). Family: family history includes Heart disease in her mother; Thyroid disease in her father.  Laboratory Chemistry Profile   Renal Lab Results  Component Value Date   BUN 36 (H) 01/09/2020   CREATININE 1.02 (H) 01/09/2020   BCR 26 12/31/2019   GFRAA 73 12/31/2019   GFRNONAA 54 (L) 01/09/2020     Hepatic Lab Results  Component Value Date   AST 26 01/09/2020   ALT 14 01/09/2020   ALBUMIN 3.4 (L) 01/09/2020   ALKPHOS 106 01/09/2020     Electrolytes Lab Results  Component Value Date   NA 139 01/09/2020   K 4.2 01/09/2020   CL 98 01/09/2020   CALCIUM 9.0 01/09/2020   MG 2.1 11/26/2019   PHOS 3.9 09/08/2014     Bone Lab Results  Component Value Date   25OHVITD1 43 11/26/2019   25OHVITD2 9.2 11/26/2019   25OHVITD3 34 11/26/2019     Inflammation (CRP: Acute Phase) (ESR: Chronic Phase) Lab Results  Component Value Date   CRP 4 11/26/2019   ESRSEDRATE 45 (H) 11/26/2019   LATICACIDVEN 1.4 01/09/2020       Note: Above Lab results reviewed.  Recent Imaging Review  DG Chest 2 View CLINICAL DATA:  Pateint has been experiencing weakness and low oxygen saturation levels. Hx. CHF, A-fib, and diabetes, and htnweakness, low SpO2  EXAM: CHEST - 2 VIEW  COMPARISON:  01/02/2020  FINDINGS: Stable enlarged  cardiac silhouette. No effusion, infiltrate or pneumothorax. Normal pulmonary vasculature. Stable chronic bronchitic markings. No acute osseous abnormality.  IMPRESSION: Cardiomegaly without acute cardiopulmonary process.  Electronically Signed   By: Suzy Bouchard M.D.   On: 01/09/2020 14:52 Note: Reviewed        Physical Exam  General appearance: Well nourished, well developed, and well hydrated. In no apparent acute distress Mental status: Alert, oriented x 3 (person, place, & time)       Respiratory: No evidence of acute respiratory distress Eyes: PERLA Vitals: BP (!) 118/57   Pulse 98   Temp (!) 97.3 F (36.3 C)   Ht 5' 5" (1.651 m)   Wt 214 lb (97.1 kg)   SpO2 98%   BMI 35.61 kg/m  BMI: Estimated body mass index is 35.61 kg/m as calculated from the following:   Height as of this encounter: 5' 5" (1.651 m).   Weight as of this encounter: 214 lb (97.1 kg). Ideal: Ideal body weight: 57 kg (125 lb 10.6 oz) Adjusted ideal body weight: 73 kg (161 lb)  Assessment   Status Diagnosis  Controlled Controlled Controlled 1. Chronic pain syndrome   2. Chronic lower extremity pain (Bilateral)   3. Chronic hip pain (2ry area of Pain) (Bilateral) (R>L)   4. Chronic low back pain (Bilateral) w/ sciatica (Bilateral)   5. Lumbar central spinal stenosis (SEVERE) (L3-4) with neurogenic claudication   6. Lumbar facet hypertrophy (Multilevel) (Bilateral)   7. Chronic knee pain (Bilateral)   8. Pharmacologic therapy   9. Chronic anticoagulation (Coumadin)   10. Subtherapeutic anticoagulation      Updated Problems: Problem  Chronic knee pain (Bilateral)  Chronic low back pain (3ry area of Pain) (Bilateral) w/ sciatica (Bilateral)  Subtherapeutic Anticoagulation    Plan of Care  Problem-specific:  No problem-specific Assessment & Plan notes found for this encounter.  Ms. DEWANNA HURSTON  has a current medication list which includes the following long-term medication(s):  albuterol, ezetimibe, fluticasone, hydrocodone-acetaminophen, [START ON 03/24/2020] hydrocodone-acetaminophen, [START ON 04/23/2020] hydrocodone-acetaminophen, [START ON 05/23/2020] hydrocodone-acetaminophen, insulin lispro, levothyroxine, lisinopril, metolazone, nitroglycerin, potassium chloride sa, torsemide, warfarin, and [DISCONTINUED] ropinirole.  Pharmacotherapy (Medications Ordered): Meds ordered this encounter  Medications  . HYDROcodone-acetaminophen (NORCO) 10-325 MG tablet    Sig: Take 1 tablet by mouth every 6 (six) hours as needed for severe pain. Must last 30 days.    Dispense:  100 tablet    Refill:  0    Chronic Pain: STOP Act (Not applicable) Fill 1 day early if closed on refill date. Avoid benzodiazepines within 8 hours of opioids  . HYDROcodone-acetaminophen (NORCO) 10-325 MG tablet    Sig: Take 1 tablet by mouth every 6 (six) hours as needed for severe pain. Must last 30 days.    Dispense:  100 tablet    Refill:  0    Chronic Pain: STOP Act (Not applicable) Fill 1 day early if closed on refill date. Avoid benzodiazepines within 8 hours of opioids  . HYDROcodone-acetaminophen (NORCO) 10-325 MG tablet    Sig: Take 1 tablet by mouth every 6 (six) hours as needed for severe pain. Must last 30 days.    Dispense:  100 tablet    Refill:  0    Chronic Pain: STOP Act (Not applicable) Fill 1 day early if closed on refill date. Avoid benzodiazepines within 8 hours of opioids   Orders:  Orders Placed This Encounter  Procedures  . DG Knee 1-2 Views Right    Standing Status:   Future    Standing Expiration Date:   04/07/2020    Scheduling Instructions:     Imaging must be done as soon as possible. Inform patient that order will expire within 30 days and I will not renew it.    Order Specific Question:   Reason for Exam (SYMPTOM  OR DIAGNOSIS REQUIRED)    Answer:   Right knee pain/arthralgia    Order Specific Question:   Preferred imaging location?    Answer:   [facility]     Order Specific Question:   Call Results- Best Contact Number?    Answer:   [phone] ([facility])    Order Specific Question:   Release to patient    Answer:   Immediate  . DG Knee 1-2 Views Left    Standing Status:   Future    Standing Expiration Date:   04/07/2020    Scheduling Instructions:     Imaging must be done as soon as possible. Inform patient that order will expire within 30 days and I will not renew it.    Order Specific Question:   Reason for Exam (SYMPTOM  OR DIAGNOSIS REQUIRED)    Answer:   Right knee pain/arthralgia    Order Specific Question:   Preferred imaging location?    Answer:   [facility]    Order Specific Question:   Call Results- Best Contact Number?    Answer:   [phone] ([facility])    Order Specific Question:   Release to patient    Answer:   Immediate  . DG HIP UNILAT W OR W/O PELVIS 2-3 VIEWS RIGHT    Please describe any evidence of DJD, such as joint narrowing, asymmetry, cysts, or any anomalies in bone density, production, or erosion.    Standing Status:   Future    Standing Expiration Date:   04/07/2020  Scheduling Instructions:     Imaging must be done as soon as possible. Inform patient that order will expire within 30 days and I will not renew it.    Order Specific Question:   Reason for Exam (SYMPTOM  OR DIAGNOSIS REQUIRED)    Answer:   Right knee pain/arthralgia    Order Specific Question:   Preferred imaging location?    Answer:   [facility]    Order Specific Question:   Call Results- Best Contact Number?    Answer:   [phone] ([facility])    Order Specific Question:   Release to patient    Answer:   Immediate  . DG HIP UNILAT W OR W/O PELVIS 2-3 VIEWS LEFT    Please describe any evidence of DJD, such as joint narrowing, asymmetry, cysts, or any anomalies in bone density, production, or erosion.    Standing Status:   Future    Standing Expiration Date:   04/07/2020    Scheduling Instructions:      Imaging must be done as soon as possible. Inform patient that order will expire within 30 days and I will not renew it.    Order Specific Question:   Reason for Exam (SYMPTOM  OR DIAGNOSIS REQUIRED)    Answer:   Right knee pain/arthralgia    Order Specific Question:   Preferred imaging location?    Answer:   [facility]    Order Specific Question:   Call Results- Best Contact Number?    Answer:   [phone] [facility]  . ToxASSURE Select 13 (MW), Urine    Volume: 30 ml(s). Minimum 3 ml of urine is needed. Document temperature of fresh sample. Indications: Long term (current) use of opiate analgesic (L87.564)    Order Specific Question:   Release to patient    Answer:   Immediate   Follow-up plan:   Return in about 3 months (around 06/22/2020) for (F2F), (Med Mgmt).     Interventional Therapies  Risk  Complexity Considerations:   NOTE: COUMADIN Anticoagulation (Stop: 5 days  Restart: 2 hours) Decreased GFR   Planned  Pending:   Diagnostic right L3-4 LESI #1  Diagnostic right L4 TFESI #1  The patient will also need to have x-rays of her cervical and thoracic spine since there are no official reports from the [facility].   Under consideration:   Diagnostic bilateral L4 TFESI x1 (Done 09/12/2019 - Dr. Alba Destine [[facility]]) Diagnostic midline caudal ESI #1 + diagnostic epidurogram  Diagnostic bilateral IA hip joint injection #1  Diagnostic right L3-4 LESI #1  Diagnostic right L4 TFESI #1  Diagnostic bilateral L3 transforaminal ESI #1  Diagnostic bilateral lumbar facet block #1    Completed:   None at this time   Therapeutic  Palliative (PRN) options:   None established    Recent Visits Date Type Provider Dept  01/05/20 Office Visit Milinda Pointer, MD [facility]  Showing recent visits within past 90 days and meeting all other requirements Today's Visits Date Type Provider Dept  03/10/20 Office Visit Milinda Pointer, MD [facility]   Showing today's visits and meeting all other requirements Future Appointments No visits were found meeting these conditions. Showing future appointments within next 90 days and meeting all other requirements  I discussed the assessment and treatment plan with the patient. The patient was provided an opportunity to ask questions and all were answered. The patient agreed with the plan and demonstrated an understanding of the instructions.  Patient advised to call back or seek an in-person evaluation if the symptoms or condition worsens.  Duration of encounter: 30 minutes.  Note by: Gaspar Cola, MD Date: 03/10/2020; Time: 1:35 PM

## 2020-03-10 ENCOUNTER — Encounter: Payer: Self-pay | Admitting: Pain Medicine

## 2020-03-10 ENCOUNTER — Ambulatory Visit (HOSPITAL_BASED_OUTPATIENT_CLINIC_OR_DEPARTMENT_OTHER): Payer: Medicare Other | Admitting: Pain Medicine

## 2020-03-10 ENCOUNTER — Ambulatory Visit
Admission: RE | Admit: 2020-03-10 | Discharge: 2020-03-10 | Disposition: A | Discharge status: Home / Self Care | Payer: Medicare Other | Attending: Pain Medicine | Admitting: Pain Medicine

## 2020-03-10 ENCOUNTER — Other Ambulatory Visit: Payer: Self-pay

## 2020-03-10 ENCOUNTER — Ambulatory Visit
Admission: RE | Admit: 2020-03-10 | Discharge: 2020-03-10 | Disposition: A | Discharge status: Home / Self Care | Payer: Medicare Other | Source: Ambulatory Visit | Attending: Pain Medicine | Admitting: Pain Medicine

## 2020-03-10 VITALS — BP 118/57 | HR 98 | Temp 97.3°F | Ht 65.0 in | Wt 214.0 lb

## 2020-03-10 DIAGNOSIS — M25561 Pain in right knee: Secondary | ICD-10-CM

## 2020-03-10 DIAGNOSIS — G894 Chronic pain syndrome: Secondary | ICD-10-CM | POA: Insufficient documentation

## 2020-03-10 DIAGNOSIS — M25552 Pain in left hip: Secondary | ICD-10-CM | POA: Insufficient documentation

## 2020-03-10 DIAGNOSIS — M48062 Spinal stenosis, lumbar region with neurogenic claudication: Secondary | ICD-10-CM | POA: Insufficient documentation

## 2020-03-10 DIAGNOSIS — G8929 Other chronic pain: Secondary | ICD-10-CM | POA: Insufficient documentation

## 2020-03-10 DIAGNOSIS — Z5181 Encounter for therapeutic drug level monitoring: Secondary | ICD-10-CM

## 2020-03-10 DIAGNOSIS — M47816 Spondylosis without myelopathy or radiculopathy, lumbar region: Secondary | ICD-10-CM | POA: Diagnosis not present

## 2020-03-10 DIAGNOSIS — M79604 Pain in right leg: Secondary | ICD-10-CM

## 2020-03-10 DIAGNOSIS — M25562 Pain in left knee: Secondary | ICD-10-CM | POA: Insufficient documentation

## 2020-03-10 DIAGNOSIS — M5442 Lumbago with sciatica, left side: Secondary | ICD-10-CM | POA: Insufficient documentation

## 2020-03-10 DIAGNOSIS — Z7901 Long term (current) use of anticoagulants: Secondary | ICD-10-CM

## 2020-03-10 DIAGNOSIS — M79605 Pain in left leg: Secondary | ICD-10-CM | POA: Insufficient documentation

## 2020-03-10 DIAGNOSIS — Z79899 Other long term (current) drug therapy: Secondary | ICD-10-CM | POA: Diagnosis not present

## 2020-03-10 DIAGNOSIS — M1711 Unilateral primary osteoarthritis, right knee: Secondary | ICD-10-CM | POA: Diagnosis not present

## 2020-03-10 DIAGNOSIS — M25551 Pain in right hip: Secondary | ICD-10-CM | POA: Insufficient documentation

## 2020-03-10 DIAGNOSIS — M5441 Lumbago with sciatica, right side: Secondary | ICD-10-CM

## 2020-03-10 MED ORDER — HYDROCODONE-ACETAMINOPHEN 10-325 MG PO TABS: 1.0000 | ORAL_TABLET | Freq: Four times a day (QID) | ORAL | 0 refills | Status: DC | PRN

## 2020-03-10 NOTE — Progress Notes (Signed)
Nursing Pain Medication Assessment:  Safety precautions to be maintained throughout the outpatient stay will include: orient to surroundings, keep bed in low position, maintain call bell within reach at all times, provide assistance with transfer out of bed and ambulation.  Medication Inspection Compliance: Pill count conducted under aseptic conditions, in front of the patient. Neither the pills nor the bottle was removed from the patient's sight at any time. Once count was completed pills were immediately returned to the patient in their original bottle.  Medication: Hydrocodone/APAP Pill/Patch Count: 48 of 120 pills remain Pill/Patch Appearance: Markings consistent with prescribed medication Bottle Appearance: Standard pharmacy container. Clearly labeled. Filled Date: 1 / 43 / 22 Last Medication intake:  TodaySafety precautions to be maintained throughout the outpatient stay will include: orient to surroundings, keep bed in low position, maintain call bell within reach at all times, provide assistance with transfer out of bed and ambulation.

## 2020-03-10 NOTE — Patient Instructions (Signed)
____________________________________________________________________________________________  Medication Rules  Purpose: To inform patients, and their family members, of our rules and regulations.  Applies to: All patients receiving prescriptions (written or electronic).  Pharmacy of record: Pharmacy where electronic prescriptions will be sent. If written prescriptions are taken to a different pharmacy, please inform the nursing staff. The pharmacy listed in the electronic medical record should be the one where you would like electronic prescriptions to be sent.  Electronic prescriptions: In compliance with the [place] Strengthen Opioid Misuse Prevention (STOP) Act of 2017 (Session Law 2017-74/H243), effective January 30, 2018, all controlled substances must be electronically prescribed. Calling prescriptions to the pharmacy will cease to exist.  Prescription refills: Only during scheduled appointments. Applies to all prescriptions.  NOTE: The following applies primarily to controlled substances (Opioid* Pain Medications).   Type of encounter (visit): For patients receiving controlled substances, face-to-face visits are required. (Not an option or up to the patient.)  Patient's responsibilities: 1. Pain Pills: Bring all pain pills to every appointment (except for procedure appointments). 2. Pill Bottles: Bring pills in original pharmacy bottle. Always bring the newest bottle. Bring bottle, even if empty. 3. Medication refills: You are responsible for knowing and keeping track of what medications you take and those you need refilled. The day before your appointment: write a list of all prescriptions that need to be refilled. The day of the appointment: give the list to the admitting nurse. Prescriptions will be written only during appointments. No prescriptions will be written on procedure days. If you forget a medication: it will not be "Called in", "Faxed", or "electronically sent".  You will need to get another appointment to get these prescribed. No early refills. Do not call asking to have your prescription filled early. 4. Prescription Accuracy: You are responsible for carefully inspecting your prescriptions before leaving our office. Have the discharge nurse carefully go over each prescription with you, before taking them home. Make sure that your name is accurately spelled, that your address is correct. Check the name and dose of your medication to make sure it is accurate. Check the number of pills, and the written instructions to make sure they are clear and accurate. Make sure that you are given enough medication to last until your next medication refill appointment. 5. Taking Medication: Take medication as prescribed. When it comes to controlled substances, taking less pills or less frequently than prescribed is permitted and encouraged. Never take more pills than instructed. Never take medication more frequently than prescribed.  6. Inform other Doctors: Always inform, all of your healthcare providers, of all the medications you take. 7. Pain Medication from other Providers: You are not allowed to accept any additional pain medication from any other Doctor or Healthcare provider. There are two exceptions to this rule. (see below) In the event that you require additional pain medication, you are responsible for notifying us, as stated below. 8. Cough Medicine: Often these contain an opioid, such as codeine or hydrocodone. Never accept or take cough medicine containing these opioids if you are already taking an opioid* medication. The combination may cause respiratory failure and death. 9. Medication Agreement: You are responsible for carefully reading and following our Medication Agreement. This must be signed before receiving any prescriptions from our practice. Safely store a copy of your signed Agreement. Violations to the Agreement will result in no further prescriptions.  (Additional copies of our Medication Agreement are available upon request.) 10. Laws, Rules, & Regulations: All patients are expected to follow all   Federal and State Laws, Statutes, Rules, & Regulations. Ignorance of the Laws does not constitute a valid excuse.  11. Illegal drugs and Controlled Substances: The use of illegal substances (including, but not limited to marijuana and its derivatives) and/or the illegal use of any controlled substances is strictly prohibited. Violation of this rule may result in the immediate and permanent discontinuation of any and all prescriptions being written by our practice. The use of any illegal substances is prohibited. 12. Adopted CDC guidelines & recommendations: Target dosing levels will be at or below 60 MME/day. Use of benzodiazepines** is not recommended.  Exceptions: There are only two exceptions to the rule of not receiving pain medications from other Healthcare Providers. 1. Exception #1 (Emergencies): In the event of an emergency (i.e.: accident requiring emergency care), you are allowed to receive additional pain medication. However, you are responsible for: As soon as you are able, call our office [phone], at any time of the day or night, and leave a message stating your name, the date and nature of the emergency, and the name and dose of the medication prescribed. In the event that your call is answered by a member of our staff, make sure to document and save the date, time, and the name of the person that took your information.  2. Exception #2 (Planned Surgery): In the event that you are scheduled by another doctor or dentist to have any type of surgery or procedure, you are allowed (for a period no longer than 30 days), to receive additional pain medication, for the acute post-op pain. However, in this case, you are responsible for picking up a copy of our "Post-op Pain Management for Surgeons" handout, and giving it to your surgeon or dentist. This  document is available at our office, and does not require an appointment to obtain it. Simply go to our office during business hours (Monday-Thursday from 8:00 AM to 4:00 PM) (Friday 8:00 AM to 12:00 Noon) or if you have a scheduled appointment with us, prior to your surgery, and ask for it by name. In addition, you are responsible for: calling our office [phone], at any time of the day or night, and leaving a message stating your name, name of your surgeon, type of surgery, and date of procedure or surgery. Failure to comply with your responsibilities may result in termination of therapy involving the controlled substances.  *Opioid medications include: morphine, codeine, oxycodone, oxymorphone, hydrocodone, hydromorphone, meperidine, tramadol, tapentadol, buprenorphine, fentanyl, methadone. **Benzodiazepine medications include: diazepam (Valium), alprazolam (Xanax), clonazepam (Klonopine), lorazepam (Ativan), clorazepate (Tranxene), chlordiazepoxide (Librium), estazolam (Prosom), oxazepam (Serax), temazepam (Restoril), triazolam (Halcion) (Last updated: 12/29/2019) ____________________________________________________________________________________________   ____________________________________________________________________________________________  Medication Recommendations and Reminders  Applies to: All patients receiving prescriptions (written and/or electronic).  Medication Rules & Regulations: These rules and regulations exist for your safety and that of others. They are not flexible and neither are we. Dismissing or ignoring them will be considered "non-compliance" with medication therapy, resulting in complete and irreversible termination of such therapy. (See document titled "Medication Rules" for more details.) In all conscience, because of safety reasons, we cannot continue providing a therapy where the patient does not follow instructions.  Pharmacy of record:   Definition:  This is the pharmacy where your electronic prescriptions will be sent.   We do not endorse any particular pharmacy, however, we have experienced problems with [facility] not securing enough medication supply for the community.  We do not restrict you in your choice of pharmacy. However,   once we write for your prescriptions, we will NOT be re-sending more prescriptions to fix restricted supply problems created by your pharmacy, or your insurance.   The pharmacy listed in the electronic medical record should be the one where you want electronic prescriptions to be sent.  If you choose to change pharmacy, simply notify our nursing staff.  Recommendations:  Keep all of your pain medications in a safe place, under lock and key, even if you live alone. We will NOT replace lost, stolen, or damaged medication.  After you fill your prescription, take 1 week's worth of pills and put them away in a safe place. You should keep a separate, properly labeled bottle for this purpose. The remainder should be kept in the original bottle. Use this as your primary supply, until it runs out. Once it's gone, then you know that you have 1 week's worth of medicine, and it is time to come in for a prescription refill. If you do this correctly, it is unlikely that you will ever run out of medicine.  To make sure that the above recommendation works, it is very important that you make sure your medication refill appointments are scheduled at least 1 week before you run out of medicine. To do this in an effective manner, make sure that you do not leave the office without scheduling your next medication management appointment. Always ask the nursing staff to show you in your prescription , when your medication will be running out. Then arrange for the receptionist to get you a return appointment, at least 7 days before you run out of medicine. Do not wait until you have 1 or 2 pills left, to come in. This is very poor planning and  does not take into consideration that we may need to cancel appointments due to bad weather, sickness, or emergencies affecting our staff.  DO NOT ACCEPT A "Partial Fill": If for any reason your pharmacy does not have enough pills/tablets to completely fill or refill your prescription, do not allow for a "partial fill". The law allows the pharmacy to complete that prescription within 72 hours, without requiring a new prescription. If they do not fill the rest of your prescription within those 72 hours, you will need a separate prescription to fill the remaining amount, which we will NOT provide. If the reason for the partial fill is your insurance, you will need to talk to the pharmacist about payment alternatives for the remaining tablets, but again, DO NOT ACCEPT A PARTIAL FILL, unless you can trust your pharmacist to obtain the remainder of the pills within 72 hours.  Prescription refills and/or changes in medication(s):   Prescription refills, and/or changes in dose or medication, will be conducted only during scheduled medication management appointments. (Applies to both, written and electronic prescriptions.)  No refills on procedure days. No medication will be changed or started on procedure days. No changes, adjustments, and/or refills will be conducted on a procedure day. Doing so will interfere with the diagnostic portion of the procedure.  No phone refills. No medications will be "called into the pharmacy".  No Fax refills.  No weekend refills.  No Holliday refills.  No after hours refills.  Remember:  Business hours are:  Monday to Thursday 8:00 AM to 4:00 PM Provider's Schedule: Tahtiana Rozier, MD - Appointments are:  Medication management: Monday and Wednesday 8:00 AM to 4:00 PM Procedure day: Tuesday and Thursday 7:30 AM to 4:00 PM Bilal Lateef, MD - Appointments are:    Medication management: Tuesday and Thursday 8:00 AM to 4:00 PM Procedure day: Monday and Wednesday  7:30 AM to 4:00 PM (Last update: 08/20/2019) ____________________________________________________________________________________________   ____________________________________________________________________________________________  CBD (cannabidiol) WARNING  Applicable to: All individuals currently taking or considering taking CBD (cannabidiol) and, more important, all patients taking opioid analgesic controlled substances (pain medication). (Example: oxycodone; oxymorphone; hydrocodone; hydromorphone; morphine; methadone; tramadol; tapentadol; fentanyl; buprenorphine; butorphanol; dextromethorphan; meperidine; codeine; etc.)  Legal status: CBD remains a Schedule I drug prohibited for any use. CBD is illegal with one exception. In the [place], CBD has a limited Food and Drug Administration (FDA) approval for the treatment of two specific types of epilepsy disorders. Only one CBD product has been approved by the FDA for this purpose: "Epidiolex". FDA is aware that some companies are marketing products containing cannabis and cannabis-derived compounds in ways that violate the Federal Food, Drug and Cosmetic Act (FD&C Act) and that may put the health and safety of consumers at risk. The FDA, a Federal agency, has not enforced the CBD status since 2018.   Legality: Some manufacturers ship CBD products nationally, which is illegal. Often such products are sold online and are therefore available throughout the country. CBD is openly sold in head shops and health food stores in some states where such sales have not been explicitly legalized. Selling unapproved products with unsubstantiated therapeutic claims is not only a violation of the law, but also can put patients at risk, as these products have not been proven to be safe or effective. Federal illegality makes it difficult to conduct research on CBD.  Reference: "FDA Regulation of Cannabis and Cannabis-Derived Products, Including Cannabidiol  (CBD)" - https://www.fda.gov/news-events/public-health-focus/fda-regulation-cannabis-and-cannabis-derived-products-including-cannabidiol-cbd  Warning: CBD is not FDA approved and has not undergo the same manufacturing controls as prescription drugs.  This means that the purity and safety of available CBD may be questionable. Most of the time, despite manufacturer's claims, it is contaminated with THC (delta-9-tetrahydrocannabinol - the chemical in marijuana responsible for the "HIGH").  When this is the case, the THC contaminant will trigger a positive urine drug screen (UDS) test for Marijuana (carboxy-THC). Because a positive UDS for any illicit substance is a violation of our medication agreement, your opioid analgesics (pain medicine) may be permanently discontinued.  MORE ABOUT CBD  General Information: CBD  is a derivative of the Marijuana (cannabis sativa) plant discovered in 1940. It is one of the 113 identified substances found in Marijuana. It accounts for up to 40% of the plant's extract. As of 2018, preliminary clinical studies on CBD included research for the treatment of anxiety, movement disorders, and pain. CBD is available and consumed in multiple forms, including inhalation of smoke or vapor, as an aerosol spray, and by mouth. It may be supplied as an oil containing CBD, capsules, dried cannabis, or as a liquid solution. CBD is thought not to be as psychoactive as THC (delta-9-tetrahydrocannabinol - the chemical in marijuana responsible for the "HIGH"). Studies suggest that CBD may interact with different biological target receptors in the body, including cannabinoid and other neurotransmitter receptors. As of 2018 the mechanism of action for its biological effects has not been determined.  Side-effects  Adverse reactions: Dry mouth, diarrhea, decreased appetite, fatigue, drowsiness, malaise, weakness, sleep disturbances, and others.  Drug interactions: CBC may interact with other  medications such as blood-thinners. (Last update: 09/06/2019) ____________________________________________________________________________________________    

## 2020-03-11 ENCOUNTER — Telehealth: Payer: Self-pay | Admitting: Pain Medicine

## 2020-03-11 NOTE — Telephone Encounter (Signed)
Called patient and she is wanting to know why her pill refill dates were changed. I looked in notes and saw that patient had some left from previous months. I talked with the RN that had her yesterday and she stated that the dates were changed because of her having them left over. Will call patient to reinforced this,.

## 2020-03-11 NOTE — Telephone Encounter (Signed)
Called patient back and explained to her that the dates were pushed out related to the amount of medications she had left over. Patient with understanding.that she can refill on Feb 23,2022.

## 2020-03-11 NOTE — Telephone Encounter (Signed)
Patient states her med refill date is not correct she will be out of meds before then, please call to verify what patient needs.

## 2020-03-13 NOTE — Progress Notes (Unsigned)
Cardiology Office Note  Date:  03/15/2020   ID:  Tracy Mann, Tracy Mann [birth year], MRN [id number]  PCP:  Jerrol Banana., MD   Chief Complaint  Patient presents with  . Follow-up    4 month F/U-Patient reports SOB since being diagnosed with COVID-19 about 3-4 weeks ago.    HPI:  Tracy Mann is a 87 year old woman with a PMH significant for  chronic atrial fibrillation on Coumadin therapy,  diastolic dysfunction, EF 40 to 45% on echo 01/2018  hypertension,  hyperlipidemia,  morbid obesity,   moderate pulmonary hypertension inferior wall myocardial infarction felt to be secondary to a thrombus from atrial fib in October 2007 at [facility],   episodes of chest pain  who presents for followup Of her coronary artery disease and leg edema.  Last seen in clinic by myself June 2021 Seen by one of our providers October 2021 On that visit reported episode of tachycardia, rate up to 120 bpm, took some metoprolol Was started on Toprol tartrate 25 daily as needed for palpitations  She was tolerating torsemide 20 daily with metolazone 2.5 twice daily Tightness across bra area  Getting over covid, last month Had hypoxia Malaise, nausea, coughing Felt like flu Increased palpitations Still having sx could not eat for a while  Weight down 9 pounds, post few months   pain clinic for chronic back pain Has lymphedema pumps, has not used   Lab work reviewed HBA1C 9.1 on labs 12/2018 HBA1C 8.3 on 05/20/2019 On insulin, Dr. Gabriel Carina  EKG personally reviewed by myself on todays visit Shows atrial fibrillation ventricular rate 78 bpm intraventricular conduction delay  Other past medical history reviewed In follow-up today, she reports that she had an episode of chest pain 05/21/2015 on the left side She went to the emergency room, was admitted. Hospital records reviewed. Cardiac enzymes were unrevealing, EKG unchanged. She had stress Myoview showing old inferior MI with  no ischemia consistent with her known anatomy. She did not take nitroglycerin for her symptoms.   PMH:   has a past medical history of Acute myocardial infarction of inferior wall ([place]), Arthritis, Chronic anticoagulation, Chronic atrial fibrillation ([place]), Chronic combined systolic and diastolic CHF (congestive heart failure) ([place]), Essential hypertension, Hyperlipidemia, mixed, Mixed Ischemic/Nonischemic Cardiomyopathy, Nonobstructive coronary artery disease, Obesity, Thyroid disease, Type II diabetes mellitus ([place]), Venous insufficiency, and Vitamin D deficiency.  PSH:    Past Surgical History:  Procedure Laterality Date  . CARDIAC CATHETERIZATION  11/2012   [facility];EF 45-50%  . CHOLECYSTECTOMY  1999  . TUBAL LIGATION  1968  . VESICOVAGINAL FISTULA CLOSURE W/ TAH  1996    Current Outpatient Medications  Medication Sig Dispense Refill  . ACCU-CHEK AVIVA PLUS test strip USE WITH METER TO CHECK GLUCOSE BEFORE MEALS AND AT BEDTIME 200 strip 12  . albuterol (VENTOLIN HFA) 108 (90 Base) MCG/ACT inhaler Inhale 2 puffs into the lungs every 6 (six) hours as needed for wheezing or shortness of breath. 8 g 2  . estradiol (ESTRACE) 0.1 MG/GM vaginal cream Place 1 Applicatorful vaginally at bedtime. 42.5 g 0  . ezetimibe (ZETIA) 10 MG tablet TAKE 1 TABLET BY MOUTH EVERY DAY 90 tablet 3  . flavoxATE (URISPAS) 100 MG tablet Take 1 tablet (100 mg total) by mouth 2 (two) times daily as needed for bladder spasms. 60 tablet 11  . fluticasone (FLONASE) 50 MCG/ACT nasal spray SPRAY TWO SPRAYS IN EACH NOSTRIL ONCE DAILY 48 mL 3  . [START ON 03/24/2020] HYDROcodone-acetaminophen (  NORCO) 10-325 MG tablet Take 1 tablet by mouth every 6 (six) hours as needed for severe pain. Must last 30 days. 100 tablet 0  . [START ON 04/23/2020] HYDROcodone-acetaminophen (NORCO) 10-325 MG tablet Take 1 tablet by mouth every 6 (six) hours as needed for severe pain. Must last 30 days. 100 tablet 0  . [START ON 05/23/2020]  HYDROcodone-acetaminophen (NORCO) 10-325 MG tablet Take 1 tablet by mouth every 6 (six) hours as needed for severe pain. Must last 30 days. 100 tablet 0  . insulin glargine (LANTUS SOLOSTAR) 100 UNIT/ML Solostar Pen Inject 20 units nightly between 8-10 PM. Adjust as directed. Max dose is 50 units daily.    . insulin lispro (HUMALOG KWIKPEN) 100 UNIT/ML KwikPen INJECT 10-20 UNITS THREE TIMES DAILY WITH EACH MEAL. 15 mL 12  . Insulin Pen Needle (RELION PEN NEEDLE 31G/8MM) 31G X 8 MM MISC Use with insulin pen three times daily 100 each 12  . levothyroxine (SYNTHROID) 112 MCG tablet Take 1 tablet (112 mcg total) by mouth daily. 90 tablet 1  . lisinopril (ZESTRIL) 2.5 MG tablet TAKE 1 TABLET BY MOUTH EVERYDAY AT BEDTIME 90 tablet 0  . LORazepam (ATIVAN) 1 MG tablet Take 1 tablet (1 mg total) by mouth 2 (two) times daily as needed. 60 tablet 1  . Magnesium 500 MG CAPS Take 1,000 mg by mouth daily.     . metolazone (ZAROXOLYN) 2.5 MG tablet Take 1 tablet (2.5 mg total) by mouth 2 (two) times a week. TAKE 30 minutes before Torsemide 26 tablet 1  . mupirocin ointment (BACTROBAN) 2 % Apply topically daily. 22 g 0  . nitroGLYCERIN (NITROSTAT) 0.4 MG SL tablet PLACE 1 TABLET (0.4 MG TOTAL) UNDER THE TONGUE EVERY 5 (FIVE) MINUTES AS NEEDED FOR CHEST PAIN. 25 tablet 1  . NONFORMULARY OR COMPOUNDED ITEM Diclofenac/Baclofen/Bu/Gabapentin Compounded Cream    . NONFORMULARY OR COMPOUNDED ITEM See pharmacy note- Not to use on any open skin or wound. 120 each 3  . ondansetron (ZOFRAN) 4 MG tablet Take 1 tablet (4 mg total) by mouth every 8 (eight) hours as needed for nausea or vomiting. 20 tablet 1  . phenazopyridine (PYRIDIUM) 200 MG tablet Take 1 tablet (200 mg total) by mouth 3 (three) times daily as needed for pain. 10 tablet 0  . potassium chloride SA (KLOR-CON) 20 MEQ tablet Take 1-2 tablets with metolazone    . torsemide (DEMADEX) 20 MG tablet Take 1 tablet (20 mg total) by mouth daily. 90 tablet 1  . warfarin  (COUMADIN) 3 MG tablet Take 3 mg by mouth as directed.    . warfarin (COUMADIN) 4 MG tablet Take 4 mg by mouth as directed.     No current facility-administered medications for this visit.    Allergies:   Duloxetine; Other; Paroxetine; Sulfa antibiotics; Cephalexin; Clarithromycin; Diphenhydramine; Estrogens conjugated; Estrogens, conjugated; Sulfonamide derivatives; and Nitrofurantoin   Social History:  The patient  reports that she has never smoked. She has never used smokeless tobacco. She reports that she does not drink alcohol and does not use drugs.   Family History:   family history includes Heart disease in her mother; Thyroid disease in her father.    Review of Systems: Review of Systems  Constitutional: Negative.   HENT: Negative.   Respiratory: Negative.   Cardiovascular: Positive for leg swelling.  Gastrointestinal: Negative.   Musculoskeletal: Positive for joint pain.  Neurological: Negative.   Psychiatric/Behavioral: Negative.   All other systems reviewed and are negative.   PHYSICAL  EXAM: VS:  BP 130/70 (BP Location: Left Arm, Patient Position: Sitting, Cuff Size: Large)   Pulse 78   Ht 5' 5.5" (1.664 m)   Wt 221 lb (100.2 kg)   SpO2 93%   BMI 36.22 kg/m  , BMI Body mass index is 36.22 kg/m. Constitutional:  oriented to person, place, and time. No distress.  HENT:  Head: Grossly normal Eyes:  no discharge. No scleral icterus.  Neck: No JVD, no carotid bruits  Cardiovascular: Regular rate and rhythm, no murmurs appreciated 1+ pitting edema b/l Pulmonary/Chest: Clear to auscultation bilaterally, no wheezes or rales Abdominal: Soft.  no distension.  no tenderness.  Musculoskeletal: Normal range of motion Neurological:  normal muscle tone. Coordination normal. No atrophy Skin: Skin warm and dry Psychiatric: normal affect, pleasant  Recent Labs: 11/26/2019: Magnesium 2.1 12/31/2019: BNP 133.0; TSH <0.005 01/09/2020: ALT 14; BUN 36; Creatinine, Ser 1.02;  Hemoglobin 11.9; Platelets 225; Potassium 4.2; Sodium 139    Lipid Panel Lab Results  Component Value Date   CHOL 172 01/17/2019   HDL 57 01/17/2019   LDLCALC 94 01/17/2019   TRIG 119 01/17/2019      Wt Readings from Last 3 Encounters:  03/15/20 221 lb (100.2 kg)  03/10/20 214 lb (97.1 kg)  01/15/20 225 lb (102.1 kg)     ASSESSMENT AND PLAN:  Acute on chronic diastolic CHF Mild exacerbation of symptoms with recent COVID Taking torsemide 20 daily, Taking metolazone 2.5 QOD (she takes it twice a day) able to take the diuretic Has compression pumps Recommended she take extra torsemide as needed in the afternoon for worsening leg swelling Leg symmetry tight today, will likely need 2 or 3 days of extra torsemide in the afternoon.  This was discussed with her  Mixed hyperlipidemia Continue Zetia Statin intolerance, no changes made No; changes  Essential hypertension - Plan: EKG 12-Lead Blood pressure is well controlled on today's visit. No changes made to the medications.  Atherosclerosis of native coronary artery of native heart without angina pectoris Prior inferior MI Echo showing stable ejection fraction 45%  Currently with no symptoms of angina. No further workup at this time. Continue current medication regimen.  Mixed Ischemic/Nonischemic Cardiomyopathy - Plan: EKG 12-Lead Weight up above goal, goal 210 Will try lymph pumps  Chronic atrial fibrillation ([place]) - Plan: EKG 12-Lead A. fib likely contributing to CHF On warfarin,  Rate controlled  Obstructive sleep apnea She reports sleep apnea "not a major issue"  Type 2 diabetes mellitus without complication, with long-term current use of insulin ([place]) We have encouraged  careful diet management in an effort to lose weight.  PVCs Asymptomatic,  No further treatment needed    Total encounter time more than 25 minutes  Greater than 50% was spent in counseling and coordination of care with the  patient   Orders Placed This Encounter  Procedures  . EKG 12-Lead     Signed, Esmond Plants, M.D., Ph.D. 03/15/2020  [place], [place]

## 2020-03-15 ENCOUNTER — Encounter: Payer: Self-pay | Admitting: Cardiovascular Disease

## 2020-03-15 ENCOUNTER — Ambulatory Visit: Payer: Medicare Other | Admitting: Cardiovascular Disease

## 2020-03-15 ENCOUNTER — Other Ambulatory Visit: Payer: Self-pay

## 2020-03-15 VITALS — BP 130/70 | HR 78 | Ht 65.5 in | Wt 221.0 lb

## 2020-03-15 DIAGNOSIS — M7989 Other specified soft tissue disorders: Secondary | ICD-10-CM

## 2020-03-15 DIAGNOSIS — I482 Chronic atrial fibrillation, unspecified: Secondary | ICD-10-CM

## 2020-03-15 DIAGNOSIS — I10 Essential (primary) hypertension: Secondary | ICD-10-CM | POA: Diagnosis not present

## 2020-03-15 DIAGNOSIS — I25118 Atherosclerotic heart disease of native coronary artery with other forms of angina pectoris: Secondary | ICD-10-CM | POA: Diagnosis not present

## 2020-03-15 DIAGNOSIS — Z794 Long term (current) use of insulin: Secondary | ICD-10-CM | POA: Diagnosis not present

## 2020-03-15 DIAGNOSIS — I5042 Chronic combined systolic (congestive) and diastolic (congestive) heart failure: Secondary | ICD-10-CM | POA: Diagnosis not present

## 2020-03-15 DIAGNOSIS — E1159 Type 2 diabetes mellitus with other circulatory complications: Secondary | ICD-10-CM | POA: Diagnosis not present

## 2020-03-15 DIAGNOSIS — R002 Palpitations: Secondary | ICD-10-CM

## 2020-03-15 DIAGNOSIS — E785 Hyperlipidemia, unspecified: Secondary | ICD-10-CM

## 2020-03-15 NOTE — Patient Instructions (Signed)
Medication Instructions:  No changes  If you need a refill on your cardiac medications before your next appointment, please call your pharmacy.    Lab work: No new labs needed   If you have labs (blood work) drawn today and your tests are completely normal, you will receive your results only by: . MyChart Message (if you have MyChart) OR . A paper copy in the mail If you have any lab test that is abnormal or we need to change your treatment, we will call you to review the results.   Testing/Procedures: No new testing needed   Follow-Up: At [facility], you and your health needs are our priority.  As part of our continuing mission to provide you with exceptional heart care, we have created designated Provider Care Teams.  These Care Teams include your primary Cardiologist (physician) and Advanced Practice Providers (APPs -  Physician Assistants and Nurse Practitioners) who all work together to provide you with the care you need, when you need it.  . You will need a follow up appointment in 6 months  . Providers on your designated Care Team:   . Christopher Berge, NP . Ryan Dunn, PA-C . Jacquelyn Visser, PA-C  Any Other Special Instructions Will Be Listed Below (If Applicable).  COVID-19 Vaccine Information can be found at: [email or web address] For questions related to vaccine distribution or appointments, please email [email or web address] or call [phone].     

## 2020-03-17 ENCOUNTER — Other Ambulatory Visit: Payer: Self-pay

## 2020-03-17 ENCOUNTER — Ambulatory Visit (INDEPENDENT_AMBULATORY_CARE_PROVIDER_SITE_OTHER): Payer: Medicare Other

## 2020-03-17 ENCOUNTER — Telehealth: Payer: Self-pay

## 2020-03-17 DIAGNOSIS — R0602 Shortness of breath: Secondary | ICD-10-CM

## 2020-03-17 DIAGNOSIS — I482 Chronic atrial fibrillation, unspecified: Secondary | ICD-10-CM | POA: Diagnosis not present

## 2020-03-17 LAB — POCT INR
INR: 2.4 (ref 2.0–3.0)
PT: 28.7

## 2020-03-17 NOTE — Telephone Encounter (Signed)
Patient is having SOB and she thinks its from previous [place].  She is asking for a chest xray to be ordered.

## 2020-03-17 NOTE — Patient Instructions (Signed)
Description   Dx: Atrial Fibrillation Current Coumadin dose: Alternate 3 mg and 4 mg each day.  F/u in 2 weeks

## 2020-03-17 NOTE — Telephone Encounter (Signed)
Patient is asking for refills on Lorazepam 1 mg. [facility]

## 2020-03-17 NOTE — Telephone Encounter (Signed)
Please advise x-ray? Disregard refill request. Patient [facility].

## 2020-03-18 LAB — TOXASSURE SELECT 13 (MW), URINE

## 2020-03-19 ENCOUNTER — Other Ambulatory Visit: Payer: Self-pay | Admitting: Family Medicine

## 2020-03-19 ENCOUNTER — Ambulatory Visit: Payer: Self-pay

## 2020-03-19 DIAGNOSIS — R059 Cough, unspecified: Secondary | ICD-10-CM

## 2020-03-19 NOTE — Telephone Encounter (Signed)
   Notes to clinic asking for inhaler early, please assess.

## 2020-03-19 NOTE — Telephone Encounter (Signed)
Patient has an appt scheduled on 03/23/20, telephone encounter from today patient was triaged by nurse. KW

## 2020-03-19 NOTE — Telephone Encounter (Signed)
I can order a CXR and she can go on Monday to [facility] to have done.

## 2020-03-19 NOTE — Telephone Encounter (Signed)
PT called back regarding the status of her request for a chest Xray. Please call back.

## 2020-03-19 NOTE — Telephone Encounter (Addendum)
Pt. Reports she had positive COVID 19 in January. Test was 02/19/20. Reports she still has fatigue, shortness of breath. States she has CHF. Weighs herself and has only gained 1 lb. States she doesn't know if this is "lingering COVID or CHF." Spoke with Elmyra Ricks in the practice. States to make appointment and PCP will be asked if in office visit is appropriate. Instructed pt. To go to ED for worsening of symptoms. Verbalizes understanding.  Reason for Disposition . [1] MODERATE longstanding difficulty breathing (e.g., speaks in phrases, SOB even at rest, pulse 100-120) AND [2] SAME as normal  Answer Assessment - Initial Assessment Questions 1. RESPIRATORY STATUS: "Describe your breathing?" (e.g., wheezing, shortness of breath, unable to speak, severe coughing)      Shortness of breath 2. ONSET: "When did this breathing problem begin?"      2-3 weeks ago 3. PATTERN "Does the difficult breathing come and go, or has it been constant since it started?"      Worse with exertion 4. SEVERITY: "How bad is your breathing?" (e.g., mild, moderate, severe)    - MILD: No SOB at rest, mild SOB with walking, speaks normally in sentences, can lay down, no retractions, pulse < 100.    - MODERATE: SOB at rest, SOB with minimal exertion and prefers to sit, cannot lie down flat, speaks in phrases, mild retractions, audible wheezing, pulse 100-120.    - SEVERE: Very SOB at rest, speaks in single words, struggling to breathe, sitting hunched forward, retractions, pulse > 120      Mild 5. RECURRENT SYMPTOM: "Have you had difficulty breathing before?" If Yes, ask: "When was the last time?" and "What happened that time?"      Yes 6. CARDIAC HISTORY: "Do you have any history of heart disease?" (e.g., heart attack, angina, bypass surgery, angioplasty)      CHF 7. LUNG HISTORY: "Do you have any history of lung disease?"  (e.g., pulmonary embolus, asthma, emphysema)     No 8. CAUSE: "What do you think is causing the  breathing problem?"      Unsure 9. OTHER SYMPTOMS: "Do you have any other symptoms? (e.g., dizziness, runny nose, cough, chest pain, fever)     Cough 10. PREGNANCY: "Is there any chance you are pregnant?" "When was your last menstrual period?"       No 11. TRAVEL: "Have you traveled out of the country in the last month?" (e.g., travel history, exposures)       No  Protocols used: BREATHING DIFFICULTY-A-AH

## 2020-03-22 ENCOUNTER — Telehealth: Payer: Self-pay

## 2020-03-22 NOTE — Telephone Encounter (Signed)
Copied from [place] [id number]. Topic: General - Other >> Mar 22, 2020  9:28 AM Keene Breath wrote: Reason for CRM: Patient would like the nurse to call her regarding her appt. Scheduled for 02/22.  She would like to speak with the nurse before she has her appt. Tomorrow.  Please call patient to discuss at [phone]

## 2020-03-22 NOTE — Telephone Encounter (Signed)
Patient was advised.  

## 2020-03-22 NOTE — Telephone Encounter (Signed)
Patient rescheduled appt 03/23/2020 to next week.

## 2020-03-22 NOTE — Telephone Encounter (Signed)
FYI!! See note from 03/19/2020.

## 2020-03-22 NOTE — Telephone Encounter (Signed)
In office ok

## 2020-03-22 NOTE — Telephone Encounter (Signed)
LMOVM for pt to return call 

## 2020-03-23 ENCOUNTER — Ambulatory Visit: Payer: Self-pay | Admitting: Family Medicine

## 2020-03-24 ENCOUNTER — Other Ambulatory Visit: Payer: Self-pay | Admitting: Adult Health

## 2020-03-30 ENCOUNTER — Ambulatory Visit (INDEPENDENT_AMBULATORY_CARE_PROVIDER_SITE_OTHER): Payer: Medicare Other | Admitting: Family Medicine

## 2020-03-30 ENCOUNTER — Ambulatory Visit: Payer: Medicare Other | Admitting: Physician Assistant

## 2020-03-30 ENCOUNTER — Other Ambulatory Visit: Payer: Self-pay

## 2020-03-30 VITALS — BP 141/71 | HR 110 | Temp 96.7°F | Ht 65.0 in | Wt 219.0 lb

## 2020-03-30 DIAGNOSIS — R319 Hematuria, unspecified: Secondary | ICD-10-CM

## 2020-03-30 DIAGNOSIS — N390 Urinary tract infection, site not specified: Secondary | ICD-10-CM

## 2020-03-30 LAB — POCT URINALYSIS DIPSTICK
Bilirubin, UA: NEGATIVE
Glucose, UA: NEGATIVE
Ketones, UA: NEGATIVE
Spec Grav, UA: 1.01 (ref 1.010–1.025)
Urobilinogen, UA: 0.2 E.U./dL
pH, UA: 8 (ref 5.0–8.0)

## 2020-03-30 MED ORDER — LEVOFLOXACIN 500 MG PO TABS: 500.0000 mg | ORAL_TABLET | Freq: Every day | ORAL | 0 refills | Status: DC

## 2020-03-30 NOTE — Progress Notes (Signed)
Established patient visit   Patient: Tracy Mann   DOB: January 09, 1934   87 y.o. Female  MRN: [id number] Visit Date: 03/30/2020  Today's healthcare provider: Lelon Huh, MD   Chief Complaint  Patient presents with  . Urinary Tract Infection   Subjective    HPI HPI    Urinary Tract Infection    This is a recurrent problem.       Last edited by Sylvester Harder, Matthews on 03/30/2020  3:01 PM. (History)      Urinary Tract Infection: Patient complains of burning urination and chills She has had symptoms for 2 days. Patient also complains of N/A. Patient denies back pain, fever and vaginal discharge. Patient does have a history of recurrent UTI.  Patient does have a history of pyelonephritis.  Has had recurrent UTIs and states that levofloxacin is the only thing that clears it up, but she was treated      Medications: Outpatient Medications Prior to Visit  Medication Sig  . ACCU-CHEK AVIVA PLUS test strip USE WITH METER TO CHECK GLUCOSE BEFORE MEALS AND AT BEDTIME  . albuterol (VENTOLIN HFA) 108 (90 Base) MCG/ACT inhaler TAKE 2 PUFFS BY MOUTH EVERY 6 HOURS AS NEEDED FOR WHEEZE OR SHORTNESS OF BREATH  . estradiol (ESTRACE) 0.1 MG/GM vaginal cream Place 1 Applicatorful vaginally at bedtime.  Marland Kitchen ezetimibe (ZETIA) 10 MG tablet TAKE 1 TABLET BY MOUTH EVERY DAY  . flavoxATE (URISPAS) 100 MG tablet Take 1 tablet (100 mg total) by mouth 2 (two) times daily as needed for bladder spasms.  . fluticasone (FLONASE) 50 MCG/ACT nasal spray SPRAY TWO SPRAYS IN EACH NOSTRIL ONCE DAILY  . HYDROcodone-acetaminophen (NORCO) 10-325 MG tablet Take 1 tablet by mouth every 6 (six) hours as needed for severe pain. Must last 30 days.  Derrill Memo ON 04/23/2020] HYDROcodone-acetaminophen (NORCO) 10-325 MG tablet Take 1 tablet by mouth every 6 (six) hours as needed for severe pain. Must last 30 days.  Derrill Memo ON 05/23/2020] HYDROcodone-acetaminophen (NORCO) 10-325 MG tablet Take 1 tablet by mouth every 6 (six)  hours as needed for severe pain. Must last 30 days.  . insulin glargine (LANTUS SOLOSTAR) 100 UNIT/ML Solostar Pen Inject 20 units nightly between 8-10 PM. Adjust as directed. Max dose is 50 units daily.  . insulin lispro (HUMALOG KWIKPEN) 100 UNIT/ML KwikPen INJECT 10-20 UNITS THREE TIMES DAILY WITH EACH MEAL.  Marland Kitchen Insulin Pen Needle (RELION PEN NEEDLE 31G/8MM) 31G X 8 MM MISC Use with insulin pen three times daily  . levothyroxine (SYNTHROID) 112 MCG tablet Take 1 tablet (112 mcg total) by mouth daily.  Marland Kitchen lisinopril (ZESTRIL) 2.5 MG tablet TAKE 1 TABLET BY MOUTH EVERYDAY AT BEDTIME  . LORazepam (ATIVAN) 1 MG tablet Take 1 tablet (1 mg total) by mouth 2 (two) times daily as needed.  . Magnesium 500 MG CAPS Take 1,000 mg by mouth daily.   . metolazone (ZAROXOLYN) 2.5 MG tablet Take 1 tablet (2.5 mg total) by mouth 2 (two) times a week. TAKE 30 minutes before Torsemide  . mupirocin ointment (BACTROBAN) 2 % Apply topically daily.  . nitroGLYCERIN (NITROSTAT) 0.4 MG SL tablet PLACE 1 TABLET (0.4 MG TOTAL) UNDER THE TONGUE EVERY 5 (FIVE) MINUTES AS NEEDED FOR CHEST PAIN.  . NONFORMULARY OR COMPOUNDED ITEM Diclofenac/Baclofen/Bu/Gabapentin Compounded Cream  . NONFORMULARY OR COMPOUNDED ITEM See pharmacy note- Not to use on any open skin or wound.  . ondansetron (ZOFRAN) 4 MG tablet Take 1 tablet (4 mg total) by  mouth every 8 (eight) hours as needed for nausea or vomiting.  . phenazopyridine (PYRIDIUM) 200 MG tablet Take 1 tablet (200 mg total) by mouth 3 (three) times daily as needed for pain.  . potassium chloride SA (KLOR-CON) 20 MEQ tablet Take 1-2 tablets with metolazone  . torsemide (DEMADEX) 20 MG tablet Take 1 tablet (20 mg total) by mouth daily.  Marland Kitchen warfarin (COUMADIN) 3 MG tablet Take 3 mg by mouth as directed.  . warfarin (COUMADIN) 4 MG tablet Take 4 mg by mouth as directed.   No facility-administered medications prior to visit.    Review of Systems  Constitutional: Positive for chills,  diaphoresis and fatigue.  Respiratory: Positive for shortness of breath.         Objective    BP (!) 141/71 (BP Location: Right Arm, Patient Position: Sitting, Cuff Size: Normal)   Pulse (!) 110   Temp (!) 96.7 F (35.9 C) (Temporal)   Ht 5\' 5"  (1.651 m)   Wt 219 lb (99.3 kg)   SpO2 92%   BMI 36.44 kg/m     Physical Exam   General Appearance:    Mildly obese female, alert, cooperative, in no acute distress  Abdomen:   soft, round or nontender. No CVA tenderness    Results for orders placed or performed in visit on 03/30/20  POCT urinalysis dipstick  Result Value Ref Range   Color, UA bright orange    Clarity, UA clear    Glucose, UA Negative Negative   Bilirubin, UA negative    Ketones, UA negative    Spec Grav, UA 1.010 1.010 - 1.025   Blood, UA moderate    pH, UA 8.0 5.0 - 8.0   Protein, UA     Urobilinogen, UA 0.2 0.2 or 1.0 E.U./dL   Nitrite, UA     Leukocytes, UA Small (1+) (A) Negative    Assessment & Plan     1. Urinary tract infection with hematuria, site unspecified  - Urine Culture - levofloxacin (LEVAQUIN) 500 MG tablet; Take 1 tablet (500 mg total) by mouth daily.  Dispense: 7 tablet; Refill: 0        The entirety of the information documented in the History of Present Illness, Review of Systems and Physical Exam were personally obtained by me. Portions of this information were initially documented by the CMA and reviewed by me for thoroughness and accuracy.      Lelon Huh, MD  [facility] [phone] (phone) [phone] (fax)  [place]

## 2020-03-31 ENCOUNTER — Ambulatory Visit: Payer: Self-pay | Admitting: Family Medicine

## 2020-04-01 LAB — URINE CULTURE

## 2020-04-02 ENCOUNTER — Telehealth: Payer: Self-pay

## 2020-04-02 NOTE — Telephone Encounter (Signed)
Pt called and verbalized understanding of information below. 

## 2020-04-02 NOTE — Telephone Encounter (Signed)
-----   Message from Birdie Sons, MD sent at 04/02/2020  6:55 AM EST ----- Urine culture is negative, she had already started Levaquin when samples collected, so this probably just means infection is sensitive to levaquin. Call if symptoms not completely resolved when finished with antibiotic

## 2020-04-13 DIAGNOSIS — M791 Myalgia, unspecified site: Secondary | ICD-10-CM | POA: Diagnosis not present

## 2020-04-13 DIAGNOSIS — E039 Hypothyroidism, unspecified: Secondary | ICD-10-CM | POA: Diagnosis not present

## 2020-04-13 DIAGNOSIS — E1159 Type 2 diabetes mellitus with other circulatory complications: Secondary | ICD-10-CM | POA: Diagnosis not present

## 2020-04-13 DIAGNOSIS — R809 Proteinuria, unspecified: Secondary | ICD-10-CM | POA: Diagnosis not present

## 2020-04-13 DIAGNOSIS — E1169 Type 2 diabetes mellitus with other specified complication: Secondary | ICD-10-CM | POA: Diagnosis not present

## 2020-04-13 DIAGNOSIS — E1129 Type 2 diabetes mellitus with other diabetic kidney complication: Secondary | ICD-10-CM | POA: Diagnosis not present

## 2020-04-13 DIAGNOSIS — Z789 Other specified health status: Secondary | ICD-10-CM | POA: Diagnosis not present

## 2020-04-13 DIAGNOSIS — E782 Mixed hyperlipidemia: Secondary | ICD-10-CM | POA: Diagnosis not present

## 2020-04-13 DIAGNOSIS — E1165 Type 2 diabetes mellitus with hyperglycemia: Secondary | ICD-10-CM | POA: Diagnosis not present

## 2020-04-13 DIAGNOSIS — Z794 Long term (current) use of insulin: Secondary | ICD-10-CM | POA: Diagnosis not present

## 2020-04-14 ENCOUNTER — Other Ambulatory Visit: Payer: Self-pay

## 2020-04-14 ENCOUNTER — Ambulatory Visit (INDEPENDENT_AMBULATORY_CARE_PROVIDER_SITE_OTHER): Payer: Medicare Other

## 2020-04-14 DIAGNOSIS — I482 Chronic atrial fibrillation, unspecified: Secondary | ICD-10-CM

## 2020-04-14 LAB — POCT INR
INR: 1.7 — AB (ref 2.0–3.0)
PT: 19.8

## 2020-04-14 NOTE — Patient Instructions (Signed)
Change to 4mg  daily except 3mg  Wednesdays.  Recheck in 2 weeks.

## 2020-04-19 ENCOUNTER — Other Ambulatory Visit: Payer: Self-pay | Admitting: Family Medicine

## 2020-04-19 NOTE — Telephone Encounter (Signed)
Requested medication (s) are due for refill today: yes  Requested medication (s) are on the active medication list:yes   Last refill: 02/11/20  #60  1 refill  Future visit scheduled yes  04/28/20  Nurse visit  Notes to clinic:   not delegated  Requested Prescriptions  Pending Prescriptions Disp Refills   LORazepam (ATIVAN) 1 MG tablet 60 tablet 1    Sig: Take 1 tablet (1 mg total) by mouth 2 (two) times daily as needed.      Not Delegated - Psychiatry:  Anxiolytics/Hypnotics Failed - 04/19/2020 12:06 PM      Failed - This refill cannot be delegated      Failed - Urine Drug Screen completed in last 360 days      Passed - Valid encounter within last 6 months    Recent Outpatient Visits           2 weeks ago Urinary tract infection with hematuria, site unspecified   [facility] Birdie Sons, MD   1 month ago [place] and fatigue   [place], [place]   2 months ago Nausea   [facility] Jerrol Banana., MD   3 months ago Cellulitis of right lower leg   [facility] Jerrol Banana., MD   3 months ago Chronic atrial fibrillation [facility])   [place], Kelby Aline, FNP       Future Appointments             In 4 months [name], Kathlene November, MD [facility], [facility]

## 2020-04-19 NOTE — Telephone Encounter (Signed)
Medication Refill - Medication: LORazepam (ATIVAN) 1 MG tablet     Preferred Pharmacy (with phone number or street name):  [id number] - [place], [address] Phone:  [phone]  Fax:  [phone]        Agent: Please be advised that RX refills may take up to 3 business days. We ask that you follow-up with your pharmacy.

## 2020-04-20 MED ORDER — LORAZEPAM 1 MG PO TABS: 1.0000 mg | ORAL_TABLET | Freq: Two times a day (BID) | ORAL | 1 refills | Status: DC | PRN

## 2020-04-28 ENCOUNTER — Other Ambulatory Visit: Payer: Self-pay | Admitting: Family Medicine

## 2020-04-28 ENCOUNTER — Ambulatory Visit (INDEPENDENT_AMBULATORY_CARE_PROVIDER_SITE_OTHER): Payer: Medicare Other | Admitting: *Deleted

## 2020-04-28 ENCOUNTER — Other Ambulatory Visit: Payer: Self-pay

## 2020-04-28 DIAGNOSIS — I482 Chronic atrial fibrillation, unspecified: Secondary | ICD-10-CM | POA: Diagnosis not present

## 2020-04-28 LAB — POCT INR
INR: 1.7 — AB (ref 2.0–3.0)
PT: 20.8

## 2020-04-28 NOTE — Telephone Encounter (Signed)
Requested Prescriptions  Pending Prescriptions Disp Refills  . lisinopril (ZESTRIL) 2.5 MG tablet [Pharmacy Med Name: LISINOPRIL 2.5 MG TABLET] 90 tablet 1    Sig: TAKE 1 TABLET BY MOUTH EVERYDAY AT BEDTIME     Cardiovascular:  ACE Inhibitors Failed - 04/28/2020  2:26 AM      Failed - Cr in normal range and within 180 days    Creatinine  Date Value Ref Range Status  01/23/2014 0.94 0.60 - 1.30 mg/dL Final   Creatinine, Ser  Date Value Ref Range Status  01/09/2020 1.02 (H) 0.44 - 1.00 mg/dL Final         Failed - Last BP in normal range    BP Readings from Last 1 Encounters:  03/30/20 (!) 141/71         Passed - K in normal range and within 180 days    Potassium  Date Value Ref Range Status  01/09/2020 4.2 3.5 - 5.1 mmol/L Final  01/23/2014 4.4 3.5 - 5.1 mmol/L Final         Passed - Patient is not pregnant      Passed - Valid encounter within last 6 months    Recent Outpatient Visits          4 weeks ago Urinary tract infection with hematuria, site unspecified   [facility] Birdie Sons, MD   2 months ago [place] and fatigue   [place], [place]   2 months ago Nausea   [facility] Jerrol Banana., MD   3 months ago Cellulitis of right lower leg   [facility] Jerrol Banana., MD   3 months ago Chronic atrial fibrillation [facility])   [place], Kelby Aline, FNP      Future Appointments            In 4 months [name], Kathlene November, MD [facility], [facility]

## 2020-04-28 NOTE — Patient Instructions (Addendum)
Take 2 mg Monday, Fridays. Than take 4 mg all other days. Recheck in 3 weeks.

## 2020-04-29 ENCOUNTER — Telehealth: Payer: Self-pay

## 2020-04-29 NOTE — Telephone Encounter (Signed)
Pt called back and stated after listening to her vm she is returning Michelle's call/ please advise

## 2020-04-29 NOTE — Telephone Encounter (Signed)
Returned call to patient.

## 2020-04-29 NOTE — Telephone Encounter (Signed)
Copied from [place] [id number]. Topic: General - Inquiry >> Apr 29, 2020  9:43 AM Scherrie Gerlach wrote: Reason for CRM:  pt states she got a call and the person did not say their name.  Bu she thinks it is about her coum results from yesterday.

## 2020-05-02 ENCOUNTER — Other Ambulatory Visit: Payer: Self-pay | Admitting: Family Medicine

## 2020-05-02 DIAGNOSIS — J309 Allergic rhinitis, unspecified: Secondary | ICD-10-CM

## 2020-05-02 NOTE — Telephone Encounter (Signed)
Requested Prescriptions  Pending Prescriptions Disp Refills  . fluticasone (FLONASE) 50 MCG/ACT nasal spray [Pharmacy Med Name: FLUTICASONE PROP 50 MCG SPRAY] 48 mL 3    Sig: SPRAY TWO SPRAYS IN EACH NOSTRIL ONCE DAILY     Ear, Nose, and Throat: Nasal Preparations - Corticosteroids Passed - 05/02/2020  1:24 AM      Passed - Valid encounter within last 12 months    Recent Outpatient Visits          1 month ago Urinary tract infection with hematuria, site unspecified   [place], [place]   2 months ago [place] and fatigue   [place], [place]   2 months ago Nausea   [facility] Jerrol Banana., MD   3 months ago Cellulitis of right lower leg   [facility] Jerrol Banana., MD   3 months ago Chronic atrial fibrillation [facility])   [place] Flinchum, Kelby Aline, FNP      Future Appointments            In 4 months [name], Kathlene November, MD [facility], [facility]

## 2020-05-05 ENCOUNTER — Other Ambulatory Visit: Payer: Self-pay | Admitting: Family Medicine

## 2020-05-05 MED ORDER — NONFORMULARY OR COMPOUNDED ITEM: 3 refills | Status: DC

## 2020-05-05 NOTE — Telephone Encounter (Signed)
refilled 

## 2020-05-05 NOTE — Telephone Encounter (Signed)
Pt stated she needed a refill on a "coumpound medicine" stated it did not have a specific name but it is a pain ointment / combination of meds that was prescribed by Dr Rosanna Randy. Pt stated it needs to be sent in to [facility] drug store listed below was . Pt gave me the medication number [id number]. Please advise.   [place], [place] [address]  [place] [address]  Phone: [phone] Fax: [phone]

## 2020-05-07 MED ORDER — NONFORMULARY OR COMPOUNDED ITEM: 3 refills | Status: DC

## 2020-05-07 NOTE — Addendum Note (Signed)
Addended by: Addison Naegeli on: 05/07/2020 11:34 AM   Modules accepted: Orders

## 2020-05-07 NOTE — Addendum Note (Signed)
Addended by: Wilburt Finlay on: 05/07/2020 10:46 AM   Modules accepted: Orders

## 2020-05-07 NOTE — Telephone Encounter (Signed)
Unable to send electronic request for medication to [facility]; spoke with Ernst Bowler at [facility]; Ernst Bowler states she is aware and has already given verbal order to pharmacy to fill prescription.

## 2020-05-07 NOTE — Telephone Encounter (Signed)
Contacted pt to discuss her needs; the pt states she needs this medication sent to [facility]; when she called the [facility] this morning, they had not received the prescription; informed pt she has 3 refills on this medication and she should contact the pharmacy when she needs refills; she verbalized understanding.

## 2020-05-07 NOTE — Telephone Encounter (Signed)
Pt called back reporting that she needs to have this sent to  [facility], [place]  [place] [address]  Phone: [phone] Fax: [phone]   Please advise, she needs her Compounded/Nonformulary medicine.   Requesting refills on this specifically so that she does not have to call monthly.

## 2020-05-07 NOTE — Telephone Encounter (Signed)
Medication sent into [facility].

## 2020-05-07 NOTE — Telephone Encounter (Signed)
Pt called and stated That [facility] in [place] has not received compound medication/ it [facility] and [facility] not [facility]/ please send to [facility]

## 2020-05-07 NOTE — Telephone Encounter (Signed)
Requested Prescriptions  Pending Prescriptions Disp Refills  . NONFORMULARY OR COMPOUNDED ITEM 120 each 3    Sig: See pharmacy note- Not to use on any open skin or wound.     There is no refill protocol information for this order    Signed Prescriptions Disp Refills   NONFORMULARY OR COMPOUNDED ITEM 120 each 3    Sig: See pharmacy note- Not to use on any open skin or wound.     There is no refill protocol information for this order     NONFORMULARY OR COMPOUNDED ITEM 120 each 3    Sig: See pharmacy note- Not to use on any open skin or wound.     There is no refill protocol information for this order

## 2020-05-11 ENCOUNTER — Other Ambulatory Visit: Payer: Self-pay

## 2020-05-11 ENCOUNTER — Ambulatory Visit (INDEPENDENT_AMBULATORY_CARE_PROVIDER_SITE_OTHER): Payer: Medicare Other | Admitting: Family Medicine

## 2020-05-11 ENCOUNTER — Encounter: Payer: Self-pay | Admitting: Family Medicine

## 2020-05-11 VITALS — BP 129/66 | HR 80 | Temp 98.1°F | Wt 217.0 lb

## 2020-05-11 DIAGNOSIS — N390 Urinary tract infection, site not specified: Secondary | ICD-10-CM

## 2020-05-11 DIAGNOSIS — N951 Menopausal and female climacteric states: Secondary | ICD-10-CM | POA: Diagnosis not present

## 2020-05-11 DIAGNOSIS — R350 Frequency of micturition: Secondary | ICD-10-CM | POA: Diagnosis not present

## 2020-05-11 DIAGNOSIS — R319 Hematuria, unspecified: Secondary | ICD-10-CM | POA: Diagnosis not present

## 2020-05-11 LAB — POCT URINALYSIS DIPSTICK
Blood, UA: NEGATIVE
Glucose, UA: NEGATIVE

## 2020-05-11 MED ORDER — ESTRADIOL 0.1 MG/GM VA CREA
1.0000 | TOPICAL_CREAM | Freq: Every day | VAGINAL | 0 refills | Status: DC
Start: 2020-05-11 — End: 2020-06-09

## 2020-05-11 MED ORDER — LEVOFLOXACIN 500 MG PO TABS: 500.0000 mg | ORAL_TABLET | Freq: Every day | ORAL | 0 refills | Status: DC

## 2020-05-11 NOTE — Progress Notes (Signed)
Established patient visit   Patient: Tracy Mann   DOB: October 15, 1933   87 y.o. Female  MRN: [id number] Visit Date: 05/11/2020  Today's healthcare provider: Laurita Quint Karman Veney, FNP   Chief Complaint  Patient presents with  . Urinary Tract Infection   Subjective    HPI  Urinary symptoms  She reports recurrent urinary frequency and urinary urgency. The current episode started a few days ago and is improving. Patient states symptoms are mild in intensity, occurring constantly. She  has been recently treated for similar symptoms.  Pt states she saved one Levaquin from last UTI she states she has already taking one and is feeling better today.  Has been on estrace in the past Was only using every few days Didn't notice immediate symptom relief but stopped this Was first given this by urology Has a long history of frequent UTIs Per last visits uses levaquin for her UTIs [place] Takes Azo for the discomfort   Associated symptoms: Yes abdominal pain No back pain  No chills No constipation  No cramping No diarrhea  No discharge No fever  No hematuria No nausea  No vomiting    ---------------------------------------------------------------------------------------  Patient Active Problem List   Diagnosis Date Noted  . Chronic knee pain (Bilateral) 03/10/2020  . Subtherapeutic anticoagulation 01/09/2020  . Confusion 01/09/2020  . Sleepiness 01/09/2020  . Bradycardia 01/09/2020  . Bilateral lower extremity edema 01/02/2020  . Wound cellulitis 01/02/2020  . Cellulitis of right lower leg 01/02/2020  . Elevated brain natriuretic peptide (BNP) level 01/02/2020  . Chronic hip pain (2ry area of Pain) (Bilateral) (R>L) 12/09/2019  . Osteoarthritis involving multiple joints 12/09/2019  . Osteoarthritis of knee (Right) 12/09/2019  . Osteoarthritis of facet joint of lumbar spine 12/09/2019  . Osteoarthritis of lumbar spine  12/09/2019  . Pharmacologic therapy 12/08/2019  . Lumbosacral facet syndrome 12/08/2019  . Lumbar lateral recess stenosis (L4-5) (Right) 11/26/2019  . Chronic low back pain (3ry area of Pain) (Bilateral) w/ sciatica (Bilateral) 11/26/2019  . Lumbosacral radiculopathy at L5 (Bilateral) 11/26/2019  . Chronic lower extremity pain (1ry area of Pain) (Bilateral) 11/26/2019  . Hypoglycemia associated with diabetes ([place]) 11/26/2019  . History of tachycardia 11/25/2019  . Chronic pain syndrome 11/25/2019  . Disorder of skeletal system 11/25/2019  . Problems influencing health status 11/25/2019  . Abnormal MRI, lumbar spine (08/22/2019) 11/25/2019  . Lumbar facet hypertrophy (Multilevel) (Bilateral) 11/25/2019  . Lumbar central spinal stenosis (SEVERE) (L3-4) with neurogenic claudication 11/25/2019  . Grade 1 Anterolisthesis of lumbar spine (L4/L5) 11/25/2019  . Lumbar facet arthropathy (L3-4 right-sided facet edema/joint effusion) 11/25/2019  . Chronic venous insufficiency 07/27/2019  . Statin intolerance 06/29/2019  . Lymphedema 06/25/2019  . Vitamin D deficiency 05/21/2019  . Angina pectoris ([place]) 06/07/2015  . Angina at rest [facility]) 05/20/2015  . Fatigue 03/03/2015  . Mixed Ischemic/Nonischemic Cardiomyopathy   . Chronic atrial fibrillation ([place])   . Chronic anticoagulation (Coumadin)   . Airway hyperreactivity 07/08/2014  . Cervical muscle strain 07/08/2014  . PNA (pneumonia) 07/08/2014  . Chest pressure 07/08/2014  . Clinical depression 07/08/2014  . DDD (degenerative disc disease), lumbosacral 07/08/2014  . Accumulation of fluid in tissues 07/08/2014  . Anxiety, generalized 07/08/2014  . Acid reflux 07/08/2014  . Folliculitis 54/62/7035  . Broken leg 07/08/2014  . Cannot sleep 07/08/2014  . Obstructive sleep apnea 07/08/2014  . Onychia of finger 07/08/2014  . Psoriasis 07/08/2014  .  Restless legs syndrome 07/08/2014  . Adult BMI 30+ 07/08/2014  . Hypothyroidism 09/04/2013  .  Type 2 diabetes mellitus without complications ([place]) 48/54/6270  . Calculus of kidney 10/31/2011  . Bladder infection, chronic 10/31/2011  . Incomplete bladder emptying 10/31/2011  . Obesity 11/02/2010  . Shortness of breath 02/01/2010  . Chronic diastolic heart failure ([place]) 02/08/2009  . Hyperlipidemia 09/16/2008  . CAD, NATIVE VESSEL 09/16/2008  . Atrial fibrillation ([place]) 09/16/2008  . Benign hypertensive heart disease without congestive heart failure 09/16/2008   Social History   Tobacco Use  . Smoking status: Never Smoker  . Smokeless tobacco: Never Used  Vaping Use  . Vaping Use: Never used  Substance Use Topics  . Alcohol use: No  . Drug use: No   Allergies  Allergen Reactions  . Duloxetine Other (See Comments)    Same as Paroxetine  . Other Anaphylaxis and Swelling    'tongue swelling'  . Paroxetine Other (See Comments)    "Horrible headache," Stomach pain, Diarrhea  . Sulfa Antibiotics Anaphylaxis  . Cephalexin     Blisters  . Clarithromycin Other (See Comments)    Hives, headaches, hard time swelling, felt like throat was closing up Hives, headaches, hard time swelling, felt like throat was closing up  . Diphenhydramine Other (See Comments)  . Estrogens Conjugated   . Estrogens, Conjugated Other (See Comments)  . Sulfonamide Derivatives Swelling    'tongue swelling'  . Nitrofurantoin Nausea Only       Medications: Outpatient Medications Prior to Visit  Medication Sig  . ACCU-CHEK AVIVA PLUS test strip USE WITH METER TO CHECK GLUCOSE BEFORE MEALS AND AT BEDTIME  . albuterol (VENTOLIN HFA) 108 (90 Base) MCG/ACT inhaler TAKE 2 PUFFS BY MOUTH EVERY 6 HOURS AS NEEDED FOR WHEEZE OR SHORTNESS OF BREATH  . ezetimibe (ZETIA) 10 MG tablet TAKE 1 TABLET BY MOUTH EVERY DAY  . flavoxATE (URISPAS) 100 MG tablet Take 1 tablet (100 mg total) by mouth 2 (two) times daily as needed for bladder spasms.  . fluticasone (FLONASE) 50 MCG/ACT nasal spray SPRAY TWO SPRAYS IN  EACH NOSTRIL ONCE DAILY  . HYDROcodone-acetaminophen (NORCO) 10-325 MG tablet Take 1 tablet by mouth every 6 (six) hours as needed for severe pain. Must last 30 days.  Derrill Memo ON 05/23/2020] HYDROcodone-acetaminophen (NORCO) 10-325 MG tablet Take 1 tablet by mouth every 6 (six) hours as needed for severe pain. Must last 30 days.  . insulin glargine (LANTUS SOLOSTAR) 100 UNIT/ML Solostar Pen Inject 20 units nightly between 8-10 PM. Adjust as directed. Max dose is 50 units daily.  . insulin lispro (HUMALOG KWIKPEN) 100 UNIT/ML KwikPen INJECT 10-20 UNITS THREE TIMES DAILY WITH EACH MEAL.  Marland Kitchen Insulin Pen Needle (RELION PEN NEEDLE 31G/8MM) 31G X 8 MM MISC Use with insulin pen three times daily  . levothyroxine (SYNTHROID) 112 MCG tablet Take 1 tablet (112 mcg total) by mouth daily.  Marland Kitchen lisinopril (ZESTRIL) 2.5 MG tablet TAKE 1 TABLET BY MOUTH EVERYDAY AT BEDTIME  . LORazepam (ATIVAN) 1 MG tablet Take 1 tablet (1 mg total) by mouth 2 (two) times daily as needed.  . Magnesium 500 MG CAPS Take 1,000 mg by mouth daily.   . metolazone (ZAROXOLYN) 2.5 MG tablet Take 1 tablet (2.5 mg total) by mouth 2 (two) times a week. TAKE 30 minutes before Torsemide  . mupirocin ointment (BACTROBAN) 2 % Apply topically daily.  . nitroGLYCERIN (NITROSTAT) 0.4 MG SL tablet PLACE 1 TABLET (0.4 MG TOTAL) UNDER THE TONGUE  EVERY 5 (FIVE) MINUTES AS NEEDED FOR CHEST PAIN.  . NONFORMULARY OR COMPOUNDED ITEM Diclofenac/Baclofen/Bu/Gabapentin Compounded Cream  . NONFORMULARY OR COMPOUNDED ITEM See pharmacy note- Not to use on any open skin or wound.  . ondansetron (ZOFRAN) 4 MG tablet Take 1 tablet (4 mg total) by mouth every 8 (eight) hours as needed for nausea or vomiting.  . phenazopyridine (PYRIDIUM) 200 MG tablet Take 1 tablet (200 mg total) by mouth 3 (three) times daily as needed for pain.  . potassium chloride SA (KLOR-CON) 20 MEQ tablet Take 1-2 tablets with metolazone  . torsemide (DEMADEX) 20 MG tablet Take 1 tablet (20 mg  total) by mouth daily.  Marland Kitchen warfarin (COUMADIN) 3 MG tablet Take 3 mg by mouth as directed.  . warfarin (COUMADIN) 4 MG tablet Take 4 mg by mouth as directed.  . [DISCONTINUED] estradiol (ESTRACE) 0.1 MG/GM vaginal cream Place 1 Applicatorful vaginally at bedtime.  . [DISCONTINUED] levofloxacin (LEVAQUIN) 500 MG tablet Take 1 tablet (500 mg total) by mouth daily.  Marland Kitchen HYDROcodone-acetaminophen (NORCO) 10-325 MG tablet Take 1 tablet by mouth every 6 (six) hours as needed for severe pain. Must last 30 days.   No facility-administered medications prior to visit.    Review of Systems  Constitutional: Negative.   Genitourinary: Positive for frequency and urgency. Negative for decreased urine volume, difficulty urinating, dyspareunia, dysuria, enuresis, flank pain, genital sores, hematuria, menstrual problem, pelvic pain, vaginal bleeding, vaginal discharge and vaginal pain.       Suprapubic discomfort  Neurological: Negative for dizziness, light-headedness and headaches.    Last CBC Lab Results  Component Value Date   WBC 10.9 (H) 01/09/2020   HGB 11.9 (L) 01/09/2020   HCT 37.0 01/09/2020   MCV 91.8 01/09/2020   MCH 29.5 01/09/2020   RDW 13.4 01/09/2020   PLT 225 01/09/2020        Objective    BP 129/66 (BP Location: Right Arm, Patient Position: Sitting, Cuff Size: Large)   Pulse 80   Temp 98.1 F (36.7 C) (Oral)   Wt 217 lb (98.4 kg)   BMI 36.11 kg/m  BP Readings from Last 3 Encounters:  05/11/20 129/66  03/30/20 (!) 141/71  03/15/20 130/70   Wt Readings from Last 3 Encounters:  05/11/20 217 lb (98.4 kg)  03/30/20 219 lb (99.3 kg)  03/15/20 221 lb (100.2 kg)      Physical Exam Constitutional:      General: She is not in acute distress.    Appearance: Normal appearance. She is not ill-appearing.  HENT:     Head: Normocephalic.  Cardiovascular:     Rate and Rhythm: Normal rate and regular rhythm.     Pulses: Normal pulses.     Heart sounds: Normal heart sounds. No  murmur heard. No friction rub. No gallop.   Pulmonary:     Effort: Pulmonary effort is normal. No respiratory distress.     Breath sounds: Normal breath sounds. No stridor. No wheezing, rhonchi or rales.  Abdominal:     General: Bowel sounds are normal.     Palpations: Abdomen is soft.     Tenderness: There is no abdominal tenderness. There is no right CVA tenderness or left CVA tenderness.  Musculoskeletal:     Right lower leg: No edema.     Left lower leg: No edema.  Skin:    General: Skin is warm and dry.  Neurological:     Mental Status: She is alert and oriented to person, place, and  time.  Psychiatric:        Mood and Affect: Mood normal.        Behavior: Behavior normal.      Results for orders placed or performed in visit on 05/11/20  POCT urinalysis dipstick  Result Value Ref Range   Color, UA     Clarity, UA     Glucose, UA Negative Negative   Bilirubin, UA     Ketones, UA     Spec Grav, UA     Blood, UA Negative    pH, UA     Protein, UA     Urobilinogen, UA     Nitrite, UA     Leukocytes, UA Small (1+) (A) Negative   Appearance     Odor      Assessment & Plan     Problem List Items Addressed This Visit   None   Visit Diagnoses    Urinary frequency    -  Primary   Relevant Orders   POCT urinalysis dipstick (Completed)   CULTURE, URINE COMPREHENSIVE   Urinalysis, microscopic only   Urinary tract infection with hematuria, site unspecified       Relevant Medications   levofloxacin (LEVAQUIN) 500 MG tablet   Vaginal dryness, menopausal       Relevant Medications   estradiol (ESTRACE) 0.1 MG/GM vaginal cream     Plan  Estradiol vaginal cream daily  Levaquin daily x 7 days  RTC/ED precautions provided  R/SE/B of medications discussed  Continue AZO for discomfort  Will follow up with culture results  Return in about 3 months (around 08/10/2020).      [place], [place] [phone] (phone) [phone]  (fax)  [place]

## 2020-05-11 NOTE — Patient Instructions (Signed)
Urinary Tract Infection, Adult A urinary tract infection (UTI) is an infection of any part of the urinary tract. The urinary tract includes:  The kidneys.  The ureters.  The bladder.  The urethra. These organs make, store, and get rid of pee (urine) in the body. What are the causes? This infection is caused by germs (bacteria) in your genital area. These germs grow and cause swelling (inflammation) of your urinary tract. What increases the risk? The following factors may make you more likely to develop this condition:  Using a small, thin tube (catheter) to drain pee.  Not being able to control when you pee or poop (incontinence).  Being female. If you are female, these things can increase the risk: ? Using these methods to prevent pregnancy:  A medicine that kills sperm (spermicide).  A device that blocks sperm (diaphragm). ? Having low levels of a female hormone (estrogen). ? Being pregnant. You are more likely to develop this condition if:  You have genes that add to your risk.  You are sexually active.  You take antibiotic medicines.  You have trouble peeing because of: ? A prostate that is bigger than normal, if you are female. ? A blockage in the part of your body that drains pee from the bladder. ? A kidney stone. ? A nerve condition that affects your bladder. ? Not getting enough to drink. ? Not peeing often enough.  You have other conditions, such as: ? Diabetes. ? A weak disease-fighting system (immune system). ? Sickle cell disease. ? Gout. ? Injury of the spine. What are the signs or symptoms? Symptoms of this condition include:  Needing to pee right away.  Peeing small amounts often.  Pain or burning when peeing.  Blood in the pee.  Pee that smells bad or not like normal.  Trouble peeing.  Pee that is cloudy.  Fluid coming from the vagina, if you are female.  Pain in the belly or lower back. Other symptoms include:  Vomiting.  Not  feeling hungry.  Feeling mixed up (confused). This may be the first symptom in older adults.  Being tired and grouchy (irritable).  A fever.  Watery poop (diarrhea). How is this treated?  Taking antibiotic medicine.  Taking other medicines.  Drinking enough water. In some cases, you may need to see a specialist. Follow these instructions at home: Medicines  Take over-the-counter and prescription medicines only as told by your doctor.  If you were prescribed an antibiotic medicine, take it as told by your doctor. Do not stop taking it even if you start to feel better. General instructions  Make sure you: ? Pee until your bladder is empty. ? Do not hold pee for a long time. ? Empty your bladder after sex. ? Wipe from front to back after peeing or pooping if you are a female. Use each tissue one time when you wipe.  Drink enough fluid to keep your pee pale yellow.  Keep all follow-up visits.   Contact a doctor if:  You do not get better after 1-2 days.  Your symptoms go away and then come back. Get help right away if:  You have very bad back pain.  You have very bad pain in your lower belly.  You have a fever.  You have chills.  You feeling like you will vomit or you vomit. Summary  A urinary tract infection (UTI) is an infection of any part of the urinary tract.  This condition is caused by   germs in your genital area.  There are many risk factors for a UTI.  Treatment includes antibiotic medicines.  Drink enough fluid to keep your pee pale yellow. This information is not intended to replace advice given to you by your health care provider. Make sure you discuss any questions you have with your health care provider. Document Revised: 08/29/2019 Document Reviewed: 08/29/2019 Elsevier Patient Education  2021 Elsevier Inc.  

## 2020-05-13 DIAGNOSIS — R350 Frequency of micturition: Secondary | ICD-10-CM | POA: Diagnosis not present

## 2020-05-14 LAB — URINALYSIS, MICROSCOPIC ONLY: Bacteria, UA: NONE SEEN

## 2020-05-14 LAB — SPECIMEN STATUS REPORT

## 2020-05-19 ENCOUNTER — Other Ambulatory Visit: Payer: Self-pay

## 2020-05-19 ENCOUNTER — Ambulatory Visit (INDEPENDENT_AMBULATORY_CARE_PROVIDER_SITE_OTHER): Payer: Medicare Other

## 2020-05-19 DIAGNOSIS — I482 Chronic atrial fibrillation, unspecified: Secondary | ICD-10-CM | POA: Diagnosis not present

## 2020-05-19 LAB — POCT INR
INR: 3 (ref 2.0–3.0)
PT: 35.5

## 2020-05-19 NOTE — Patient Instructions (Signed)
Description   4 mg daily except 2 mg on Sun and Wed. F/u 3 wks

## 2020-05-20 LAB — CULTURE, URINE COMPREHENSIVE

## 2020-05-20 LAB — SPECIMEN STATUS REPORT

## 2020-06-09 ENCOUNTER — Ambulatory Visit (INDEPENDENT_AMBULATORY_CARE_PROVIDER_SITE_OTHER): Payer: Medicare Other

## 2020-06-09 ENCOUNTER — Other Ambulatory Visit: Payer: Self-pay

## 2020-06-09 ENCOUNTER — Other Ambulatory Visit: Payer: Self-pay | Admitting: Family Medicine

## 2020-06-09 DIAGNOSIS — I482 Chronic atrial fibrillation, unspecified: Secondary | ICD-10-CM | POA: Diagnosis not present

## 2020-06-09 DIAGNOSIS — N951 Menopausal and female climacteric states: Secondary | ICD-10-CM

## 2020-06-09 LAB — POCT INR
INR: 4 — AB (ref 2.0–3.0)
PT: 47.5

## 2020-06-09 MED ORDER — ESTRADIOL 0.1 MG/GM VA CREA: 1.0000 | TOPICAL_CREAM | Freq: Every day | VAGINAL | 0 refills | Status: DC

## 2020-06-09 NOTE — Telephone Encounter (Signed)
Pt called in to request a refill for estradiol (ESTRACE) 0.1 MG/GM vaginal cream. Pt has not contacted her pharmacy.       Pharmacy:  [id number] [place], [place] [address] Phone:  [phone]  Fax:  [phone]

## 2020-06-09 NOTE — Telephone Encounter (Signed)
Requested medication (s) are due for refill today: yes  Requested medication (s) are on the active medication list:yes  Last refill:  05/13/2020  Future visit scheduled: yes  Notes to clinic:     Failed protocol: No Mammogram is not up-to-date per Health Maintenance  Requested Prescriptions  Pending Prescriptions Disp Refills   estradiol (ESTRACE) 0.1 MG/GM vaginal cream 42.5 g 0    Sig: Place 1 Applicatorful vaginally at bedtime.      OB/GYN:  Estrogens Failed - 06/09/2020 10:05 AM      Failed - Mammogram is up-to-date per Health Maintenance      Passed - Last BP in normal range    BP Readings from Last 1 Encounters:  05/11/20 129/66          Passed - Valid encounter within last 12 months    Recent Outpatient Visits           4 weeks ago Urinary frequency   [facility], Laurita Quint, FNP   2 months ago Urinary tract infection with hematuria, site unspecified   [facility] Birdie Sons, MD   3 months ago [place] and fatigue   [place], [place]   3 months ago Nausea   [facility] Jerrol Banana., MD   4 months ago Cellulitis of right lower leg   [facility] Jerrol Banana., MD       Future Appointments             In 2 months Jerrol Banana., MD [facility], [place]   In 3 months [name], Kathlene November, MD [facility], [place]

## 2020-06-09 NOTE — Patient Instructions (Signed)
Hold for one day.  Restart 3mg  daily, recheck in one week.

## 2020-06-15 DIAGNOSIS — Z79891 Long term (current) use of opiate analgesic: Secondary | ICD-10-CM | POA: Insufficient documentation

## 2020-06-15 DIAGNOSIS — F1120 Opioid dependence, uncomplicated: Secondary | ICD-10-CM | POA: Insufficient documentation

## 2020-06-15 NOTE — Progress Notes (Signed)
PROVIDER NOTE: Information contained herein reflects review and annotations entered in association with encounter. Interpretation of such information and data should be left to medically-trained personnel. Information provided to patient can be located elsewhere in the medical record under "Patient Instructions". Document created using STT-dictation technology, any transcriptional errors that may result from process are unintentional.    Patient: Tracy Mann  Service Category: E/M  Provider: Francisco A Naveira, MD  DOB: 05/16/1933  DOS: 06/16/2020  Specialty: Interventional Pain Management  MRN: [id number]  Setting: Ambulatory outpatient  PCP: Gilbert, Richard L Jr., MD  Type: Established Patient    Referring Provider: Gilbert, Richard L Jr.,*  Location: Office  Delivery: Face-to-face     HPI  [name], a 87 y.o. year old female, is here today because of her Cervicalgia [M54.2]. Ms. Ahn's primary complain today is Hip Pain (Buttocks bilateral ), Leg Pain (Bilateral, pain all the way down to the feet.  Sensitive to touch), Neck Pain (Right ), and Headache (Base of skull right side radiating up the back of head. ) Last encounter: My last encounter with her was on 03/11/2020. Pertinent problems: [name] has Cervical muscle strain; DDD (degenerative disc disease), lumbosacral; Chronic venous insufficiency; Chronic pain syndrome; Abnormal MRI, lumbar spine (08/22/2019); Lumbar facet hypertrophy (Multilevel) (Bilateral); Lumbar central spinal stenosis (SEVERE) (L3-4) with neurogenic claudication; Grade 1 Anterolisthesis of lumbar spine (L4/L5); Lumbar facet arthropathy (L3-4 right-sided facet edema/joint effusion); Lumbar lateral recess stenosis (L4-5) (Right); Chronic low back pain (3ry area of Pain) (Bilateral) w/ sciatica (Bilateral); Lumbosacral radiculopathy at L5 (Bilateral); Chronic lower extremity pain (1ry area of Pain) (Bilateral); Lumbosacral facet syndrome; Chronic hip pain (2ry area of  Pain) (Bilateral) (R>L); Osteoarthritis involving multiple joints; Osteoarthritis of knee (Right); Osteoarthritis of facet joint of lumbar spine; Osteoarthritis of lumbar spine; Bilateral lower extremity edema; Cellulitis of right lower leg; Chronic knee pain (Bilateral); Peripheral vascular disease (HCC) (lower extremity) (Bilateral); Cervicalgia; and Chronic upper back pain on their pertinent problem list. Pain Assessment: Severity of Chronic pain is reported as a 5  (pain pill @ 0930)/10. Location: Hip (see visit info for additional pain sites.) Left,Right/hip pain down the legs to the feet.. Onset: More than a month ago. Quality: Aching,Discomfort,Constant,Sore,Headache. Timing: Constant. Modifying factor(s): medications help the pain and allow her to go through her day.  patient is concerned that dosage has been decreased and she is not doing as well as she was prior to the decrease. Vitals:  height is 5' 5.5" (1.664 m) and weight is 218 lb (98.9 kg). Her temporal temperature is 97.7 F (36.5 C). Her blood pressure is 137/65 and her pulse is 105 (abnormal). Her respiration is 16 and oxygen saturation is 95%.   Reason for encounter: medication management.   The patient indicates doing well with the current medication regimen. No adverse reactions or side effects reported to the medications.   Today the patient had several questions regarding her medications.  When I originally took over her case we started writing for the oxycodone/APAP 10/325 #120 pills/month.  However, once the patient brought her medications back and we were able to count her medications surplus, we realized that she was actually not using 4 pills/day.  The total amount of pills that she received over a period of 1 year were added in divided by 2 # days and that showed that the patient was using an average of 3.4 pills/day.  Because she had a surplus, we then cut down the amount of   pills to 100 pills, but her true use is 102  pills/month.  Today we will go up to 105 pills/month so that she has some extra medication, but definitely not as much as she was getting before.  The patient also has a history of peripheral vascular disease with chronic bilateral lower extremity venous stasis and cellulitis.  She is having pain in the cervical region, upper back, lower back, and lower extremities.  An MRI of the lumbar spine reveals that the patient has severe L3-4 central spinal stenosis with right-sided L4-5 lateral recess stenosis.  In view of this, I will start by scheduling the patient to return for a diagnostic bilateral L3 TFESI + a right-sided L4 TFESI under fluoroscopic guidance and IV sedation.  The patient is already seen a vascular surgeon that told her that there is not much that can be done for her lower extremity vascular problems.  She is having cervicalgia and occipital headaches that are likely to be cervicogenic.  We try to get a hold of the x-rays that she claimed she had done at the [facility], but there are no official report on that.  She is also complaining of upper back pain.  In view of this, we will be ordering today x-rays of the cervical spine as well as x-rays of the upper back (thoracic spine).  We will review those when she returns for her nerve blocks.  RTCB: 09/20/2020  Pharmacotherapy Assessment   Analgesic: Hydrocodone/APAP 10/325, 1 tab p.o. 4 times daily (last filled on 11/12/2019) MME/day: 40 mg/day   Monitoring: [place] PMP: PDMP reviewed during this encounter.       Pharmacotherapy: No side-effects or adverse reactions reported. Compliance: No problems identified. Effectiveness: Clinically acceptable.  Patterson, Tracy G, RN  06/16/2020  2:28 PM  Sign when Signing Visit Nursing Pain Medication Assessment:  Safety precautions to be maintained throughout the outpatient stay will include: orient to surroundings, keep bed in low position, maintain call bell within reach at all times,  provide assistance with transfer out of bed and ambulation.  Medication Inspection Compliance: Pill count conducted under aseptic conditions, in front of the patient. Neither the pills nor the bottle was removed from the patient's sight at any time. Once count was completed pills were immediately returned to the patient in their original bottle.  Medication: Hydrocodone/APAP Pill/Patch Count: 20 of 100 pills remain Pill/Patch Appearance: Markings consistent with prescribed medication Bottle Appearance: Standard pharmacy container. Clearly labeled. Filled Date: 04 / 24 / 2022 Last Medication intake:  Today    UDS:  Summary  Date Value Ref Range Status  03/10/2020 Note  Final    Comment:    ==================================================================== [employer] Select 13 (MW) ==================================================================== Test                             Result       Flag       Units  Drug Present   Lorazepam                      >1205                   ng/mg creat    Source of lorazepam is a scheduled prescription medication.    Hydrocodone                    1742                      ng/mg creat   Hydromorphone                  209                     ng/mg creat   Dihydrocodeine                 576                     ng/mg creat   Norhydrocodone                 >3012                   ng/mg creat    Sources of hydrocodone include scheduled prescription medications.    Hydromorphone, dihydrocodeine and norhydrocodone are expected    metabolites of hydrocodone. Hydromorphone and dihydrocodeine are    also available as scheduled prescription medications.  ==================================================================== Test                      Result    Flag   Units      Ref Range   Creatinine              166              mg/dL      >=20 ==================================================================== Declared Medications:  Medication list was  not provided. ==================================================================== For clinical consultation, please call [phone]. ====================================================================      ROS  Constitutional: Denies any fever or chills Gastrointestinal: No reported hemesis, hematochezia, vomiting, or acute GI distress Musculoskeletal: Denies any acute onset joint swelling, redness, loss of ROM, or weakness Neurological: No reported episodes of acute onset apraxia, aphasia, dysarthria, agnosia, amnesia, paralysis, loss of coordination, or loss of consciousness  Medication Review  HYDROcodone-acetaminophen, Insulin Pen Needle, LORazepam, Magnesium, NONFORMULARY OR COMPOUNDED ITEM, albuterol, estradiol, ezetimibe, flavoxATE, fluticasone, furosemide, glucose blood, insulin glargine, insulin lispro, levothyroxine, lisinopril, metolazone, mupirocin ointment, nitroGLYCERIN, phenazopyridine, potassium chloride SA, rOPINIRole, and warfarin  History Review  Allergy: Ms. Meacham is allergic to duloxetine; other; paroxetine; sulfa antibiotics; cephalexin; clarithromycin; diphenhydramine; estrogens conjugated; estrogens, conjugated; sulfonamide derivatives; and nitrofurantoin. Drug: Ms. Urquilla  reports no history of drug use. Alcohol:  reports no history of alcohol use. Tobacco:  reports that she has never smoked. She has never used smokeless tobacco. Social: Ms. Rance  reports that she has never smoked. She has never used smokeless tobacco. She reports that she does not drink alcohol and does not use drugs. Medical:  has a past medical history of Acute myocardial infarction of inferior wall (HCC), Arthritis, Chronic anticoagulation, Chronic atrial fibrillation (HCC), Chronic combined systolic and diastolic CHF (congestive heart failure) (HCC), Essential hypertension, Hyperlipidemia, mixed, Mixed Ischemic/Nonischemic Cardiomyopathy, Nonobstructive coronary artery disease, Obesity, Thyroid  disease, Type II diabetes mellitus (HCC), Venous insufficiency, and Vitamin D deficiency. Surgical: Ms. Maus  has a past surgical history that includes Vesicovaginal fistula closure w/ TAH (1996); Tubal ligation (1968); Cholecystectomy (1999); and Cardiac catheterization (11/2012). Family: family history includes Heart disease in her mother; Thyroid disease in her father.  Laboratory Chemistry Profile   Renal Lab Results  Component Value Date   BUN 36 (H) 01/09/2020   CREATININE 1.02 (H) 01/09/2020   BCR 26 12/31/2019   GFRAA 73 12/31/2019   GFRNONAA 54 (L) 01/09/2020     Hepatic Lab Results  Component Value Date   AST 26 01/09/2020   ALT   14 01/09/2020   ALBUMIN 3.4 (L) 01/09/2020   ALKPHOS 106 01/09/2020     Electrolytes Lab Results  Component Value Date   NA 139 01/09/2020   K 4.2 01/09/2020   CL 98 01/09/2020   CALCIUM 9.0 01/09/2020   MG 2.1 11/26/2019   PHOS 3.9 09/08/2014     Bone Lab Results  Component Value Date   25OHVITD1 43 11/26/2019   25OHVITD2 9.2 11/26/2019   25OHVITD3 34 11/26/2019     Inflammation (CRP: Acute Phase) (ESR: Chronic Phase) Lab Results  Component Value Date   CRP 4 11/26/2019   ESRSEDRATE 45 (H) 11/26/2019   LATICACIDVEN 1.4 01/09/2020       Note: Above Lab results reviewed.  Recent Imaging Review  DG HIP UNILAT W OR W/O PELVIS 2-3 VIEWS LEFT CLINICAL DATA:  Hip pain  EXAM: DG HIP (WITH OR WITHOUT PELVIS) 2-3V LEFT  COMPARISON:  None.  FINDINGS: There is no evidence of hip fracture or dislocation. There is no evidence of arthropathy or other focal bone abnormality.  IMPRESSION: Negative.  Electronically Signed   By: Kevin  Herman M.D.   On: 03/11/2020 01:44 DG HIP UNILAT W OR W/O PELVIS 2-3 VIEWS RIGHT CLINICAL DATA:  Hip pain  EXAM: DG HIP (WITH OR WITHOUT PELVIS) 2-3V RIGHT  COMPARISON:  None.  FINDINGS: There is no evidence of hip fracture or dislocation. There is no evidence of arthropathy or other  focal bone abnormality.  IMPRESSION: Negative.  Electronically Signed   By: Kevin  Herman M.D.   On: 03/11/2020 01:40 DG Knee 1-2 Views Left CLINICAL DATA:  Right knee pain  EXAM: LEFT KNEE - 1-2 VIEW  COMPARISON:  None.  FINDINGS: No evidence of fracture, dislocation, or joint effusion. No evidence of arthropathy or other focal bone abnormality. Soft tissues are unremarkable.  IMPRESSION: Negative.  Electronically Signed   By: Kevin  Herman M.D.   On: 03/11/2020 01:39 DG Knee 1-2 Views Right CLINICAL DATA:  Right knee pain  EXAM: RIGHT KNEE - 1-2 VIEW  COMPARISON:  None.  FINDINGS: Mild narrowing of the medial femorotibial joint space. No joint effusion. No fracture or dislocation.  IMPRESSION: Mild medial femorotibial osteoarthrosis.  Electronically Signed   By: Kevin  Herman M.D.   On: 03/11/2020 01:39 Note: Reviewed        Physical Exam  General appearance: Well nourished, well developed, and well hydrated. In no apparent acute distress Mental status: Alert, oriented x 3 (person, place, & time)       Respiratory: No evidence of acute respiratory distress Eyes: PERLA Vitals: BP 137/65 (BP Location: Right Arm, Patient Position: Sitting, Cuff Size: Normal)   Pulse (!) 105   Temp 97.7 F (36.5 C) (Temporal)   Resp 16   Ht 5' 5.5" (1.664 m)   Wt 218 lb (98.9 kg)   SpO2 95%   BMI 35.73 kg/m  BMI: Estimated body mass index is 35.73 kg/m as calculated from the following:   Height as of this encounter: 5' 5.5" (1.664 m).   Weight as of this encounter: 218 lb (98.9 kg). Ideal: Ideal body weight: 58.2 kg (128 lb 3.2 oz) Adjusted ideal body weight: 74.4 kg (164 lb 1.9 oz)  Assessment   Status Diagnosis  Controlled Controlled Controlled 1. Cervicalgia   2. Chronic upper back pain   3. Chronic lower extremity pain (1ry area of Pain) (Bilateral)   4. Lumbar central spinal stenosis (SEVERE) (L3-4) with neurogenic claudication   5. Lumbar lateral    recess stenosis (L4-5) (Right)   6. Chronic hip pain (2ry area of Pain) (Bilateral) (R>L)   7. Chronic low back pain (3ry area of Pain) (Bilateral) w/ sciatica (Bilateral)   8. Grade 1 Anterolisthesis of lumbar spine (L4/L5)   9. Lumbar facet arthropathy (L3-4 right-sided facet edema/joint effusion)   10. Chronic pain syndrome   11. Pharmacologic therapy   12. Chronic use of opiate for therapeutic purpose   13. Uncomplicated opioid dependence (HCC)   14. Peripheral vascular disease (HCC) (lower extremity) (Bilateral)   15. Chronic anticoagulation (Coumadin)      Updated Problems: Problem  Peripheral vascular disease (HCC) (lower extremity) (Bilateral)  Cervicalgia  Chronic Upper Back Pain    Plan of Care  Problem-specific:  No problem-specific Assessment & Plan notes found for this encounter.  Tracy Mann has a current medication list which includes the following long-term medication(s): albuterol, ezetimibe, fluticasone, [START ON 06/22/2020] hydrocodone-acetaminophen, [START ON 07/22/2020] hydrocodone-acetaminophen, [START ON 08/21/2020] hydrocodone-acetaminophen, insulin lispro, levothyroxine, lisinopril, metolazone, nitroglycerin, potassium chloride sa, warfarin, and [DISCONTINUED] ropinirole.  Pharmacotherapy (Medications Ordered): Meds ordered this encounter  Medications  . HYDROcodone-acetaminophen (NORCO) 10-325 MG tablet    Sig: Take 1 tablet by mouth every 6 (six) hours as needed for severe pain. Must last 30 days.    Dispense:  105 tablet    Refill:  0    Not a duplicate. Do NOT delete! Dispense 1 day early if closed on refill date. Avoid benzodiazepines within 8 hours of opioids. Do not send refill requests.  . HYDROcodone-acetaminophen (NORCO) 10-325 MG tablet    Sig: Take 1 tablet by mouth every 6 (six) hours as needed for severe pain. Must last 30 days.    Dispense:  105 tablet    Refill:  0    Not a duplicate. Do NOT delete! Dispense 1 day early if closed on  refill date. Avoid benzodiazepines within 8 hours of opioids. Do not send refill requests.  . HYDROcodone-acetaminophen (NORCO) 10-325 MG tablet    Sig: Take 1 tablet by mouth every 6 (six) hours as needed for severe pain. Must last 30 days.    Dispense:  105 tablet    Refill:  0    Not a duplicate. Do NOT delete! Dispense 1 day early if closed on refill date. Avoid benzodiazepines within 8 hours of opioids. Do not send refill requests.   Orders:  Orders Placed This Encounter  Procedures  . Lumbar Transforaminal Epidural    Standing Status:   Future    Standing Expiration Date:   07/17/2020    Scheduling Instructions:     Side: Bilateral     Level: L3     Sedation: With Sedation.     Timeframe: ASAA    Order Specific Question:   Where will this procedure be performed?    Answer:   [facility]  . DG Cervical Spine With Flex & Extend    Patient presents with axial pain with possible radicular component.  Please evaluate for any evidence of cervical spine instability. Describe the presence of any spondylolisthesis (Antero- or retrolisthesis). If present, provide displacement "Grade" and measurement in cm. Please describe presence and specific location (Level & Laterality) of any signs of  osteoarthritis, zygapophyseal (Facet) joints DJD (including decreased joint space and/or osteophytosis), DDD, Foraminal narrowing, as well as any sclerosis and/or cyst formation. Please comment on ROM. In addition to any acute findings, please report on:  1. Facet (Zygapophyseal) joint DJD (Hypertrophy, space   narrowing, subchondral sclerosis, and/or osteophyte formation) 2. DDD and/or IVDD (Loss of disc height, desiccation or "Black disc disease") 3. Pars defects 4. Spondylolisthesis, spondylosis, and/or spondyloarthropathies (include Degree/Grade of displacement in mm) 5. Vertebral body Fractures, including age (old, new/acute) 54. Modic Type Changes 7. Demineralization 8. Bone pathology 9.  Central, Lateral Recess, and/or Foraminal Stenosis (include AP diameter of stenosis in mm) 10. Surgical changes (hardware type, status, and presence of fibrosis) NOTE: Please specify level(s) and laterality. If applicable: Please indicate ROM and/or evidence of instability (>77m displacement between flexion and extension views)    Standing Status:   Future    Standing Expiration Date:   07/17/2020    Scheduling Instructions:     Imaging must be done as soon as possible. Inform patient that order will expire within 30 days and I will not renew it.    Order Specific Question:   Reason for Exam (SYMPTOM  OR DIAGNOSIS REQUIRED)    Answer:   Cervicalgia    Order Specific Question:   Preferred imaging location?    Answer:   [facility]    Order Specific Question:   Call Results- Best Contact Number?    Answer:   [phone]([facility]    Order Specific Question:   Radiology Contrast Protocol - do NOT remove file path    Answer:   [email or web address]    Order Specific Question:   Release to patient    Answer:   Immediate  . DG Thoracic Spine 2 View    Patient presents with axial pain with possible radicular component.  In addition to any acute findings, please report on:  1. Facet (Zygapophyseal) joint DJD (Hypertrophy, space narrowing, subchondral sclerosis, and/or osteophyte formation) 2. DDD and/or IVDD (Loss of disc height, desiccation or "Black disc disease") 3. Pars defects 4. Spondylolisthesis, spondylosis, and/or spondyloarthropathies (include Degree/Grade of displacement in mm) 5. Vertebral body Fractures, including age (old, new/acute) 660 Modic Type Changes 7. Demineralization 8. Bone pathology 9. Central, Lateral Recess, and/or Foraminal Stenosis (include AP diameter of stenosis in mm) 10. Surgical changes (hardware type, status, and presence of fibrosis) NOTE: Please specify level(s) and laterality.    Standing Status:    Future    Standing Expiration Date:   07/17/2020    Scheduling Instructions:     Imaging must be done as soon as possible. Inform patient that order will expire within 30 days and I will not renew it.    Order Specific Question:   Reason for Exam (SYMPTOM  OR DIAGNOSIS REQUIRED)    Answer:   Upper back pain and/or thoracic spine pain.    Order Specific Question:   Preferred imaging location?    Answer:   [facility]    Order Specific Question:   Call Results- Best Contact Number?    Answer:   [phone]([facility]  . Blood Thinner Instructions to Nursing    Always make sure patient has clearance from prescribing physician to stop blood thinners for interventional therapies. If the patient requires a Lovenox-bridge therapy, make sure arrangements are made to institute it with the assistance of the PCP.    Scheduling Instructions:     Have Ms. BTosostop the Coumadin (Warfarin) X 5 days prior to procedure or surgery.  . Blood Thinner Instructions to Nursing    If unable to stop, ask if Lovenox-bridge therapy may be possible, and if so, request their assistance in implementing it.    Scheduling Instructions:  Contact the physician prescribing the blood thinner and request clearance to stop it for time period stipulated below.     If approved by prescribing physician, stop Coumadin (Warfarin) X 5 days prior to procedure or surgery.   Follow-up plan:   Return for Procedure (w/ sedation): (B) L3 TFESI #1 + (R) L4 TFESI #1, (Blood Thinner Protocol).      Interventional Therapies  Risk  Complexity Considerations:   Estimated body mass index is 35.73 kg/m as calculated from the following:   Height as of this encounter: 5' 5.5" (1.664 m).   Weight as of this encounter: 218 lb (98.9 kg). NOTE: COUMADIN Anticoagulation (Stop: 5 days  Restart: 2 hours) Decreased GFR   Planned  Pending:   Diagnostic bilateral L3 TFESI #1  Diagnostic right L4 TFESI #1  Diagnostic right L3-4  LESI #1  Diagnostic right L4 TFESI #1  The patient will also need to have x-rays of her cervical and thoracic spine since there are no official reports from the [facility].   Under consideration:   Diagnostic bilateral L4 TFESI x1 (Done 09/12/2019 - Dr. Morales [[facility]]) Diagnostic midline caudal ESI #1 + diagnostic epidurogram  Diagnostic bilateral IA hip joint injection #1  Diagnostic right L3-4 LESI #1  Diagnostic right L4 TFESI #1  Diagnostic bilateral L3 transforaminal ESI #1  Diagnostic bilateral lumbar facet block #1    Completed:   None at this time   Therapeutic  Palliative (PRN) options:   None established     Recent Visits No visits were found meeting these conditions. Showing recent visits within past 90 days and meeting all other requirements Today's Visits Date Type Provider Dept  06/16/20 Office Visit Naveira, Francisco, MD [facility]  Showing today's visits and meeting all other requirements Future Appointments No visits were found meeting these conditions. Showing future appointments within next 90 days and meeting all other requirements  I discussed the assessment and treatment plan with the patient. The patient was provided an opportunity to ask questions and all were answered. The patient agreed with the plan and demonstrated an understanding of the instructions.  Patient advised to call back or seek an in-person evaluation if the symptoms or condition worsens.  Duration of encounter: 30 minutes.  Note by: Francisco A Naveira, MD Date: 06/16/2020; Time: 3:17 PM 

## 2020-06-16 ENCOUNTER — Ambulatory Visit: Payer: Medicare Other

## 2020-06-16 ENCOUNTER — Ambulatory Visit (HOSPITAL_BASED_OUTPATIENT_CLINIC_OR_DEPARTMENT_OTHER): Payer: Medicare Other | Admitting: Pain Medicine

## 2020-06-16 ENCOUNTER — Other Ambulatory Visit: Payer: Self-pay

## 2020-06-16 ENCOUNTER — Ambulatory Visit
Admission: RE | Admit: 2020-06-16 | Discharge: 2020-06-16 | Disposition: A | Discharge status: Home / Self Care | Payer: Medicare Other | Source: Ambulatory Visit | Attending: Pain Medicine | Admitting: Pain Medicine

## 2020-06-16 ENCOUNTER — Encounter: Payer: Self-pay | Admitting: Pain Medicine

## 2020-06-16 VITALS — BP 137/65 | HR 105 | Temp 97.7°F | Resp 16 | Ht 65.5 in | Wt 218.0 lb

## 2020-06-16 DIAGNOSIS — M79604 Pain in right leg: Secondary | ICD-10-CM | POA: Insufficient documentation

## 2020-06-16 DIAGNOSIS — M25551 Pain in right hip: Secondary | ICD-10-CM | POA: Insufficient documentation

## 2020-06-16 DIAGNOSIS — I739 Peripheral vascular disease, unspecified: Secondary | ICD-10-CM | POA: Insufficient documentation

## 2020-06-16 DIAGNOSIS — M48062 Spinal stenosis, lumbar region with neurogenic claudication: Secondary | ICD-10-CM | POA: Insufficient documentation

## 2020-06-16 DIAGNOSIS — M79605 Pain in left leg: Secondary | ICD-10-CM | POA: Insufficient documentation

## 2020-06-16 DIAGNOSIS — M5442 Lumbago with sciatica, left side: Secondary | ICD-10-CM | POA: Insufficient documentation

## 2020-06-16 DIAGNOSIS — M48061 Spinal stenosis, lumbar region without neurogenic claudication: Secondary | ICD-10-CM | POA: Diagnosis not present

## 2020-06-16 DIAGNOSIS — M549 Dorsalgia, unspecified: Secondary | ICD-10-CM | POA: Insufficient documentation

## 2020-06-16 DIAGNOSIS — Z7901 Long term (current) use of anticoagulants: Secondary | ICD-10-CM | POA: Insufficient documentation

## 2020-06-16 DIAGNOSIS — M47816 Spondylosis without myelopathy or radiculopathy, lumbar region: Secondary | ICD-10-CM | POA: Insufficient documentation

## 2020-06-16 DIAGNOSIS — M4602 Spinal enthesopathy, cervical region: Secondary | ICD-10-CM | POA: Diagnosis not present

## 2020-06-16 DIAGNOSIS — Z79899 Other long term (current) drug therapy: Secondary | ICD-10-CM | POA: Insufficient documentation

## 2020-06-16 DIAGNOSIS — M542 Cervicalgia: Secondary | ICD-10-CM | POA: Insufficient documentation

## 2020-06-16 DIAGNOSIS — M546 Pain in thoracic spine: Secondary | ICD-10-CM | POA: Diagnosis not present

## 2020-06-16 DIAGNOSIS — G8929 Other chronic pain: Secondary | ICD-10-CM | POA: Insufficient documentation

## 2020-06-16 DIAGNOSIS — F1120 Opioid dependence, uncomplicated: Secondary | ICD-10-CM

## 2020-06-16 DIAGNOSIS — G894 Chronic pain syndrome: Secondary | ICD-10-CM | POA: Diagnosis not present

## 2020-06-16 DIAGNOSIS — M5441 Lumbago with sciatica, right side: Secondary | ICD-10-CM

## 2020-06-16 DIAGNOSIS — Z79891 Long term (current) use of opiate analgesic: Secondary | ICD-10-CM

## 2020-06-16 DIAGNOSIS — M4316 Spondylolisthesis, lumbar region: Secondary | ICD-10-CM

## 2020-06-16 DIAGNOSIS — M25552 Pain in left hip: Secondary | ICD-10-CM | POA: Insufficient documentation

## 2020-06-16 DIAGNOSIS — M47812 Spondylosis without myelopathy or radiculopathy, cervical region: Secondary | ICD-10-CM | POA: Diagnosis not present

## 2020-06-16 DIAGNOSIS — M4802 Spinal stenosis, cervical region: Secondary | ICD-10-CM | POA: Diagnosis not present

## 2020-06-16 MED ORDER — HYDROCODONE-ACETAMINOPHEN 10-325 MG PO TABS: 1.0000 | ORAL_TABLET | Freq: Four times a day (QID) | ORAL | 0 refills | Status: DC | PRN

## 2020-06-16 NOTE — Progress Notes (Signed)
Nursing Pain Medication Assessment:  Safety precautions to be maintained throughout the outpatient stay will include: orient to surroundings, keep bed in low position, maintain call bell within reach at all times, provide assistance with transfer out of bed and ambulation.  Medication Inspection Compliance: Pill count conducted under aseptic conditions, in front of the patient. Neither the pills nor the bottle was removed from the patient's sight at any time. Once count was completed pills were immediately returned to the patient in their original bottle.  Medication: Hydrocodone/APAP Pill/Patch Count: 20 of 100 pills remain Pill/Patch Appearance: Markings consistent with prescribed medication Bottle Appearance: Standard pharmacy container. Clearly labeled. Filled Date: 04 / 24 / 2022 Last Medication intake:  Today

## 2020-06-16 NOTE — Patient Instructions (Addendum)
____________________________________________________________________________________________  Preparing for Procedure with Sedation  Procedure appointments are limited to planned procedures: . No Prescription Refills. . No disability issues will be discussed. . No medication changes will be discussed.  Instructions: . Oral Intake: Do not eat or drink anything for at least 8 hours prior to your procedure. (Exception: Blood Pressure Medication. See below.) . Transportation: Unless otherwise stated by your physician, you may drive yourself after the procedure. . Blood Pressure Medicine: Do not forget to take your blood pressure medicine with a sip of water the morning of the procedure. If your Diastolic (lower reading)is above 100 mmHg, elective cases will be cancelled/rescheduled. . Blood thinners: These will need to be stopped for procedures. Notify our staff if you are taking any blood thinners. Depending on which one you take, there will be specific instructions on how and when to stop it. . Diabetics on insulin: Notify the staff so that you can be scheduled 1st case in the morning. If your diabetes requires high dose insulin, take only  of your normal insulin dose the morning of the procedure and notify the staff that you have done so. . Preventing infections: Shower with an antibacterial soap the morning of your procedure. . Build-up your immune system: Take 1000 mg of Vitamin C with every meal (3 times a day) the day prior to your procedure. . Antibiotics: Inform the staff if you have a condition or reason that requires you to take antibiotics before dental procedures. . Pregnancy: If you are pregnant, call and cancel the procedure. . Sickness: If you have a cold, fever, or any active infections, call and cancel the procedure. . Arrival: You must be in the facility at least 30 minutes prior to your scheduled procedure. . Children: Do not bring children with you. . Dress appropriately:  Bring dark clothing that you would not mind if they get stained. . Valuables: Do not bring any jewelry or valuables.  Reasons to call and reschedule or cancel your procedure: (Following these recommendations will minimize the risk of a serious complication.) . Surgeries: Avoid having procedures within 2 weeks of any surgery. (Avoid for 2 weeks before or after any surgery). . Flu Shots: Avoid having procedures within 2 weeks of a flu shots or . (Avoid for 2 weeks before or after immunizations). . Barium: Avoid having a procedure within 7-10 days after having had a radiological study involving the use of radiological contrast. (Myelograms, Barium swallow or enema study). . Heart attacks: Avoid any elective procedures or surgeries for the initial 6 months after a "Myocardial Infarction" (Heart Attack). . Blood thinners: It is imperative that you stop these medications before procedures. Let us know if you if you take any blood thinner.  . Infection: Avoid procedures during or within two weeks of an infection (including chest colds or gastrointestinal problems). Symptoms associated with infections include: Localized redness, fever, chills, night sweats or profuse sweating, burning sensation when voiding, cough, congestion, stuffiness, runny nose, sore throat, diarrhea, nausea, vomiting, cold or Flu symptoms, recent or current infections. It is specially important if the infection is over the area that we intend to treat. . Heart and lung problems: Symptoms that may suggest an active cardiopulmonary problem include: cough, chest pain, breathing difficulties or shortness of breath, dizziness, ankle swelling, uncontrolled high or unusually low blood pressure, and/or palpitations. If you are experiencing any of these symptoms, cancel your procedure and contact your primary care physician for an evaluation.  Remember:  Regular Business hours are:    Monday to Thursday 8:00 AM to 4:00 PM  Provider's  Schedule: Milinda Pointer, MD:  Procedure days: Tuesday and Thursday 7:30 AM to 4:00 PM  Gillis Santa, MD:  Procedure days: Monday and Wednesday 7:30 AM to 4:00 PM ____________________________________________________________________________________________   ____________________________________________________________________________________________  Blood Thinners  IMPORTANT NOTICE:  If you take any of these, make sure to notify the nursing staff.  Failure to do so may result in injury.  Recommended time intervals to stop and restart blood-thinners, before & after invasive procedures  Generic Name Brand Name Stop Time. Must be stopped at least this long before procedures. After procedures, wait at least this long before re-starting.  Abciximab Reopro 15 days 2 hrs  Alteplase Activase 10 days 10 days  Anagrelide Agrylin    Apixaban Eliquis 3 days 6 hrs  Cilostazol Pletal 3 days 5 hrs  Clopidogrel Plavix 7-10 days 2 hrs  Dabigatran Pradaxa 5 days 6 hrs  Dalteparin Fragmin 24 hours 4 hrs  Dipyridamole Aggrenox 11days 2 hrs  Edoxaban Lixiana; Savaysa 3 days 2 hrs  Enoxaparin  Lovenox 24 hours 4 hrs  Eptifibatide Integrillin 8 hours 2 hrs  Fondaparinux  Arixtra 72 hours 12 hrs  Hydroxychloroquine Plaquenil 11 days   Prasugrel Effient 7-10 days 6 hrs  Reteplase Retavase 10 days 10 days  Rivaroxaban Xarelto 3 days 6 hrs  Ticagrelor Brilinta 5-7 days 6 hrs  Ticlopidine Ticlid 10-14 days 2 hrs  Tinzaparin Innohep 24 hours 4 hrs  Tirofiban Aggrastat 8 hours 2 hrs  Warfarin Coumadin 5 days 2 hrs   Other medications with blood-thinning effects  Product indications Generic (Brand) names Note  Cholesterol Lipitor Stop 4 days before procedure  Blood thinner (injectable) Heparin (LMW or LMWH Heparin) Stop 24 hours before procedure  Cancer Ibrutinib (Imbruvica) Stop 7 days before procedure  Malaria/Rheumatoid Hydroxychloroquine (Plaquenil) Stop 11 days before procedure   Thrombolytics  10 days before or after procedures   Over-the-counter (OTC) Products with blood-thinning effects  Product Common names Stop Time  Aspirin > 325 mg Goody Powders, Excedrin, etc. 11 days  Aspirin ? 81 mg  7 days  Fish oil  4 days  Garlic supplements  7 days  Ginkgo biloba  36 hours  Ginseng  24 hours  NSAIDs Ibuprofen, Naprosyn, etc. 3 days  Vitamin E  4 days   ____________________________________________________________________________________________   ____________________________________________________________________________________________  Drug Holidays (Slow)  What is a "Drug Holiday"? Drug Holiday: is the name given to the period of time during which a patient stops taking a medication(s) for the purpose of eliminating tolerance to the drug.  Benefits . Improved effectiveness of opioids. . Decreased opioid dose needed to achieve benefits. . Improved pain with lesser dose.  What is tolerance? Tolerance: is the progressive decreased in effectiveness of a drug due to its repetitive use. With repetitive use, the body gets use to the medication and as a consequence, it loses its effectiveness. This is a common problem seen with opioid pain medications. As a result, a larger dose of the drug is needed to achieve the same effect that used to be obtained with a smaller dose.  How long should a "Drug Holiday" last? You should stay off of the pain medicine for at least 14 consecutive days. (2 weeks)  Should I stop the medicine "cold [place]"? No. You should always coordinate with your Pain Specialist so that he/she can provide you with the correct medication dose to make the transition as smoothly as possible.  How do I  stop the medicine? Slowly. You will be instructed to decrease the daily amount of pills that you take by one (1) pill every seven (7) days. This is called a "slow downward taper" of your dose. For example: if you normally take four (4) pills per day,  you will be asked to drop this dose to three (3) pills per day for seven (7) days, then to two (2) pills per day for seven (7) days, then to one (1) per day for seven (7) days, and at the end of those last seven (7) days, this is when the "Drug Holiday" would start.   Will I have withdrawals? By doing a "slow downward taper" like this one, it is unlikely that you will experience any significant withdrawal symptoms. Typically, what triggers withdrawals is the sudden stop of a high dose opioid therapy. Withdrawals can usually be avoided by slowly decreasing the dose over a prolonged period of time. If you do not follow these instructions and decide to stop your medication abruptly, withdrawals may be possible.  What are withdrawals? Withdrawals: refers to the wide range of symptoms that occur after stopping or dramatically reducing opiate drugs after heavy and prolonged use. Withdrawal symptoms do not occur to patients that use low dose opioids, or those who take the medication sporadically. Contrary to benzodiazepine (example: Valium, Xanax, etc.) or alcohol withdrawals ("Delirium Tremens"), opioid withdrawals are not lethal. Withdrawals are the physical manifestation of the body getting rid of the excess receptors.  Expected Symptoms Early symptoms of withdrawal may include: . Agitation . Anxiety . Muscle aches . Increased tearing . Insomnia . Runny nose . Sweating . Yawning  Late symptoms of withdrawal may include: . Abdominal cramping . Diarrhea . Dilated pupils . Goose bumps . Nausea . Vomiting  Will I experience withdrawals? Due to the slow nature of the taper, it is very unlikely that you will experience any.  What is a slow taper? Taper: refers to the gradual decrease in dose.  (Last update:  08/20/2019) ____________________________________________________________________________________________    ____________________________________________________________________________________________  Medication Recommendations and Reminders  Applies to: All patients receiving prescriptions (written and/or electronic).  Medication Rules & Regulations: These rules and regulations exist for your safety and that of others. They are not flexible and neither are we. Dismissing or ignoring them will be considered "non-compliance" with medication therapy, resulting in complete and irreversible termination of such therapy. (See document titled "Medication Rules" for more details.) In all conscience, because of safety reasons, we cannot continue providing a therapy where the patient does not follow instructions.  Pharmacy of record:   Definition: This is the pharmacy where your electronic prescriptions will be sent.   We do not endorse any particular pharmacy, however, we have experienced problems with [facility] not securing enough medication supply for the community.  We do not restrict you in your choice of pharmacy. However, once we write for your prescriptions, we will NOT be re-sending more prescriptions to fix restricted supply problems created by your pharmacy, or your insurance.   The pharmacy listed in the electronic medical record should be the one where you want electronic prescriptions to be sent.  If you choose to change pharmacy, simply notify our nursing staff.  Recommendations:  Keep all of your pain medications in a safe place, under lock and key, even if you live alone. We will NOT replace lost, stolen, or damaged medication.  After you fill your prescription, take 1 week's worth of pills and put them away in  a safe place. You should keep a separate, properly labeled bottle for this purpose. The remainder should be kept in the original bottle. Use this as your primary supply,  until it runs out. Once it's gone, then you know that you have 1 week's worth of medicine, and it is time to come in for a prescription refill. If you do this correctly, it is unlikely that you will ever run out of medicine.  To make sure that the above recommendation works, it is very important that you make sure your medication refill appointments are scheduled at least 1 week before you run out of medicine. To do this in an effective manner, make sure that you do not leave the office without scheduling your next medication management appointment. Always ask the nursing staff to show you in your prescription , when your medication will be running out. Then arrange for the receptionist to get you a return appointment, at least 7 days before you run out of medicine. Do not wait until you have 1 or 2 pills left, to come in. This is very poor planning and does not take into consideration that we may need to cancel appointments due to bad weather, sickness, or emergencies affecting our staff.  DO NOT ACCEPT A "Partial Fill": If for any reason your pharmacy does not have enough pills/tablets to completely fill or refill your prescription, do not allow for a "partial fill". The law allows the pharmacy to complete that prescription within 72 hours, without requiring a new prescription. If they do not fill the rest of your prescription within those 72 hours, you will need a separate prescription to fill the remaining amount, which we will NOT provide. If the reason for the partial fill is your insurance, you will need to talk to the pharmacist about payment alternatives for the remaining tablets, but again, DO NOT ACCEPT A PARTIAL FILL, unless you can trust your pharmacist to obtain the remainder of the pills within 72 hours.  Prescription refills and/or changes in medication(s):   Prescription refills, and/or changes in dose or medication, will be conducted only during scheduled medication management appointments.  (Applies to both, written and electronic prescriptions.)  No refills on procedure days. No medication will be changed or started on procedure days. No changes, adjustments, and/or refills will be conducted on a procedure day. Doing so will interfere with the diagnostic portion of the procedure.  No phone refills. No medications will be "called into the pharmacy".  No Fax refills.  No weekend refills.  No Holliday refills.  No after hours refills.  Remember:  Business hours are:  Monday to Thursday 8:00 AM to 4:00 PM Provider's Schedule: Milinda Pointer, MD - Appointments are:  Medication management: Monday and Wednesday 8:00 AM to 4:00 PM Procedure day: Tuesday and Thursday 7:30 AM to 4:00 PM Gillis Santa, MD - Appointments are:  Medication management: Tuesday and Thursday 8:00 AM to 4:00 PM Procedure day: Monday and Wednesday 7:30 AM to 4:00 PM (Last update: 08/20/2019) ____________________________________________________________________________________________   ____________________________________________________________________________________________  CBD (cannabidiol) WARNING  Applicable to: All individuals currently taking or considering taking CBD (cannabidiol) and, more important, all patients taking opioid analgesic controlled substances (pain medication). (Example: oxycodone; oxymorphone; hydrocodone; hydromorphone; morphine; methadone; tramadol; tapentadol; fentanyl; buprenorphine; butorphanol; dextromethorphan; meperidine; codeine; etc.)  Legal status: CBD remains a Schedule I drug prohibited for any use. CBD is illegal with one exception. In the [place], CBD has a limited Transport planner (FDA) approval for the  treatment of two specific types of epilepsy disorders. Only one CBD product has been approved by the FDA for this purpose: "Epidiolex". FDA is aware that some companies are marketing products containing cannabis and cannabis-derived compounds  in ways that violate the [employer], Drug and Cosmetic Act [facility] Act) and that may put the health and safety of consumers at risk. The FDA, a Federal agency, has not enforced the CBD status since 2018.   Legality: Some manufacturers ship CBD products nationally, which is illegal. Often such products are sold online and are therefore available throughout the country. CBD is openly sold in head shops and health food stores in some states where such sales have not been explicitly legalized. Selling unapproved products with unsubstantiated therapeutic claims is not only a violation of the law, but also can put patients at risk, as these products have not been proven to be safe or effective. Federal illegality makes it difficult to conduct research on CBD.  Reference: "FDA Regulation of Cannabis and Cannabis-Derived Products, Including Cannabidiol (CBD)" - [employer]  Warning: CBD is not FDA approved and has not undergo the same manufacturing controls as prescription drugs.  This means that the purity and safety of available CBD may be questionable. Most of the time, despite manufacturer's claims, it is contaminated with THC (delta-9-tetrahydrocannabinol - the chemical in marijuana responsible for the "HIGH").  When this is the case, the [facility] contaminant will trigger a positive urine drug screen (UDS) test for Marijuana (carboxy-THC). Because a positive UDS for any illicit substance is a violation of our medication agreement, your opioid analgesics (pain medicine) may be permanently discontinued.  MORE ABOUT CBD  General Information: CBD  is a derivative of the Marijuana (cannabis sativa) plant discovered in 35. It is one of the 113 identified substances found in Marijuana. It accounts for up to 40% of the plant's extract. As of 2018, preliminary clinical studies on CBD included research  for the treatment of anxiety, movement disorders, and pain. CBD is available and consumed in multiple forms, including inhalation of smoke or vapor, as an aerosol spray, and by mouth. It may be supplied as an oil containing CBD, capsules, dried cannabis, or as a liquid solution. CBD is thought not to be as psychoactive as THC (delta-9-tetrahydrocannabinol - the chemical in marijuana responsible for the "HIGH"). Studies suggest that CBD may interact with different biological target receptors in the body, including cannabinoid and other neurotransmitter receptors. As of 2018 the mechanism of action for its biological effects has not been determined.  Side-effects  Adverse reactions: Dry mouth, diarrhea, decreased appetite, fatigue, drowsiness, malaise, weakness, sleep disturbances, and others.  Drug interactions: CBC may interact with other medications such as blood-thinners. (Last update: 09/06/2019) ____________________________________________________________________________________________   ____________________________________________________________________________________________  Medication Rules  Purpose: To inform patients, and their family members, of our rules and regulations.  Applies to: All patients receiving prescriptions (written or electronic).  Pharmacy of record: Pharmacy where electronic prescriptions will be sent. If written prescriptions are taken to a different pharmacy, please inform the nursing staff. The pharmacy listed in the electronic medical record should be the one where you would like electronic prescriptions to be sent.  Electronic prescriptions: In compliance with the [place] (STOP) Act of 2017 (Session Lanny Cramp [phone]), effective January 30, 2018, all controlled substances must be electronically prescribed. Calling prescriptions to the pharmacy will cease to exist.  Prescription refills: Only during scheduled  appointments. Applies to all prescriptions.  NOTE: The following  applies primarily to controlled substances (Opioid* Pain Medications).   Type of encounter (visit): For patients receiving controlled substances, face-to-face visits are required. (Not an option or up to the patient.)  Patient's responsibilities: 1. Pain Pills: Bring all pain pills to every appointment (except for procedure appointments). 2. Pill Bottles: Bring pills in original pharmacy bottle. Always bring the newest bottle. Bring bottle, even if empty. 3. Medication refills: You are responsible for knowing and keeping track of what medications you take and those you need refilled. The day before your appointment: write a list of all prescriptions that need to be refilled. The day of the appointment: give the list to the admitting nurse. Prescriptions will be written only during appointments. No prescriptions will be written on procedure days. If you forget a medication: it will not be "Called in", "Faxed", or "electronically sent". You will need to get another appointment to get these prescribed. No early refills. Do not call asking to have your prescription filled early. 4. Prescription Accuracy: You are responsible for carefully inspecting your prescriptions before leaving our office. Have the discharge nurse carefully go over each prescription with you, before taking them home. Make sure that your name is accurately spelled, that your address is correct. Check the name and dose of your medication to make sure it is accurate. Check the number of pills, and the written instructions to make sure they are clear and accurate. Make sure that you are given enough medication to last until your next medication refill appointment. 5. Taking Medication: Take medication as prescribed. When it comes to controlled substances, taking less pills or less frequently than prescribed is permitted and encouraged. Never take more pills than  instructed. Never take medication more frequently than prescribed.  6. Inform other Doctors: Always inform, all of your healthcare providers, of all the medications you take. 7. Pain Medication from other Providers: You are not allowed to accept any additional pain medication from any other Doctor or Healthcare provider. There are two exceptions to this rule. (see below) In the event that you require additional pain medication, you are responsible for notifying us, as stated below. 8. Cough Medicine: Often these contain an opioid, such as codeine or hydrocodone. Never accept or take cough medicine containing these opioids if you are already taking an opioid* medication. The combination may cause respiratory failure and death. 9. Medication Agreement: You are responsible for carefully reading and following our Medication Agreement. This must be signed before receiving any prescriptions from our practice. Safely store a copy of your signed Agreement. Violations to the Agreement will result in no further prescriptions. (Additional copies of our Medication Agreement are available upon request.) 10. Laws, Rules, & Regulations: All patients are expected to follow all Federal and [employer], [employer], Rules, [employer]. Ignorance of the Laws does not constitute a valid excuse.  11. Illegal drugs and Controlled Substances: The use of illegal substances (including, but not limited to marijuana and its derivatives) and/or the illegal use of any controlled substances is strictly prohibited. Violation of this rule may result in the immediate and permanent discontinuation of any and all prescriptions being written by our practice. The use of any illegal substances is prohibited. 12. Adopted CDC guidelines & recommendations: Target dosing levels will be at or below 60 MME/day. Use of benzodiazepines** is not recommended.  Exceptions: There are only two exceptions to the rule of not receiving pain medications from  other Healthcare Providers. 1. Exception #1 (Emergencies): In the event of  an emergency (i.e.: accident requiring emergency care), you are allowed to receive additional pain medication. However, you are responsible for: As soon as you are able, call our office [phone], at any time of the day or night, and leave a message stating your name, the date and nature of the emergency, and the name and dose of the medication prescribed. In the event that your call is answered by a member of our staff, make sure to document and save the date, time, and the name of the person that took your information.  2. Exception #2 (Planned Surgery): In the event that you are scheduled by another doctor or dentist to have any type of surgery or procedure, you are allowed (for a period no longer than 30 days), to receive additional pain medication, for the acute post-op pain. However, in this case, you are responsible for picking up a copy of our "Post-op Pain Management for Surgeons" handout, and giving it to your surgeon or dentist. This document is available at our office, and does not require an appointment to obtain it. Simply go to our office during business hours (Monday-Thursday from 8:00 AM to 4:00 PM) (Friday 8:00 AM to 12:00 Noon) or if you have a scheduled appointment with [place], prior to your surgery, and ask for it by name. In addition, you are responsible for: calling our office [phone], at any time of the day or night, and leaving a message stating your name, name of your surgeon, type of surgery, and date of procedure or surgery. Failure to comply with your responsibilities may result in termination of therapy involving the controlled substances.  *Opioid medications include: morphine, codeine, oxycodone, oxymorphone, hydrocodone, hydromorphone, meperidine, tramadol, tapentadol, buprenorphine, fentanyl, methadone. **Benzodiazepine medications include: diazepam (Valium), alprazolam (Xanax), clonazepam  (Klonopine), lorazepam (Ativan), clorazepate (Tranxene), chlordiazepoxide (Librium), estazolam (Prosom), oxazepam (Serax), temazepam (Restoril), triazolam (Halcion) (Last updated: 12/29/2019) ____________________________________________________________________________________________   GENERAL RISKS AND COMPLICATIONS  What are the risk, side effects and possible complications? Generally speaking, most procedures are safe.  However, with any procedure there are risks, side effects, and the possibility of complications.  The risks and complications are dependent upon the sites that are lesioned, or the type of nerve block to be performed.  The closer the procedure is to the spine, the more serious the risks are.  Great care is taken when placing the radio frequency needles, block needles or lesioning probes, but sometimes complications can occur. 1. Infection: Any time there is an injection through the skin, there is a risk of infection.  This is why sterile conditions are used for these blocks.  There are four possible types of infection. 1. Localized skin infection. 2. Central Nervous System Infection-This can be in the form of Meningitis, which can be deadly. 3. Epidural Infections-This can be in the form of an epidural abscess, which can cause pressure inside of the spine, causing compression of the spinal cord with subsequent paralysis. This would require an emergency surgery to decompress, and there are no guarantees that the patient would recover from the paralysis. 4. Discitis-This is an infection of the intervertebral discs.  It occurs in about 1% of discography procedures.  It is difficult to treat and it may lead to surgery.        2. Pain: the needles have to go through skin and soft tissues, will cause soreness.       3. Damage to internal structures:  The nerves to be lesioned may be near blood vessels  or    other nerves which can be potentially damaged.       4. Bleeding: Bleeding is  more common if the patient is taking blood thinners such as  aspirin, Coumadin, Ticiid, Plavix, etc., or if he/she have some genetic predisposition  such as hemophilia. Bleeding into the spinal canal can cause compression of the spinal  cord with subsequent paralysis.  This would require an emergency surgery to  decompress and there are no guarantees that the patient would recover from the  paralysis.       5. Pneumothorax:  Puncturing of a lung is a possibility, every time a needle is introduced in  the area of the chest or upper back.  Pneumothorax refers to free air around the  collapsed lung(s), inside of the thoracic cavity (chest cavity).  Another two possible  complications related to a similar event would include: Hemothorax and Chylothorax.   These are variations of the Pneumothorax, where instead of air around the collapsed  lung(s), you may have blood or chyle, respectively.       6. Spinal headaches: They may occur with any procedures in the area of the spine.       7. Persistent CSF (Cerebro-Spinal Fluid) leakage: This is a rare problem, but may occur  with prolonged intrathecal or epidural catheters either due to the formation of a fistulous  track or a dural tear.       8. Nerve damage: By working so close to the spinal cord, there is always a possibility of  nerve damage, which could be as serious as a permanent spinal cord injury with  paralysis.       9. Death:  Although rare, severe deadly allergic reactions known as "Anaphylactic  reaction" can occur to any of the medications used.      10. Worsening of the symptoms:  We can always make thing worse.  What are the chances of something like this happening? Chances of any of this occuring are extremely low.  By statistics, you have more of a chance of getting killed in a motor vehicle accident: while driving to the hospital than any of the above occurring .  Nevertheless, you should be aware that they are possibilities.  In general, it is  similar to taking a shower.  Everybody knows that you can slip, hit your head and get killed.  Does that mean that you should not shower again?  Nevertheless always keep in mind that statistics do not mean anything if you happen to be on the wrong side of them.  Even if a procedure has a 1 (one) in a 1,000,000 (million) chance of going wrong, it you happen to be that one..Also, keep in mind that by statistics, you have more of a chance of having something go wrong when taking medications.  Who should not have this procedure? If you are on a blood thinning medication (e.g. Coumadin, Plavix, see list of "Blood Thinners"), or if you have an active infection going on, you should not have the procedure.  If you are taking any blood thinners, please inform your physician.  How should I prepare for this procedure?  Do not eat or drink anything at least six hours prior to the procedure.  Bring a driver with you .  It cannot be a taxi.  Come accompanied by an adult that can drive you back, and that is strong enough to help you if your legs get weak or numb from the  local anesthetic.  Take all of your medicines the morning of the procedure with just enough water to swallow them.  If you have diabetes, make sure that you are scheduled to have your procedure done first thing in the morning, whenever possible.  If you have diabetes, take only half of your insulin dose and notify our nurse that you have done so as soon as you arrive at the clinic.  If you are diabetic, but only take blood sugar pills (oral hypoglycemic), then do not take them on the morning of your procedure.  You may take them after you have had the procedure.  Do not take aspirin or any aspirin-containing medications, at least eleven (11) days prior to the procedure.  They may prolong bleeding.  Wear loose fitting clothing that may be easy to take off and that you would not mind if it got stained with Betadine or blood.  Do not wear any  jewelry or perfume  Remove any nail coloring.  It will interfere with some of our monitoring equipment.  NOTE: Remember that this is not meant to be interpreted as a complete list of all possible complications.  Unforeseen problems may occur.  BLOOD THINNERS The following drugs contain aspirin or other products, which can cause increased bleeding during surgery and should not be taken for 2 weeks prior to and 1 week after surgery.  If you should need take something for relief of minor pain, you may take acetaminophen which is found in Tylenol,m Datril, Anacin-3 and Panadol. It is not blood thinner. The products listed below are.  Do not take any of the products listed below in addition to any listed on your instruction sheet.  A.P.C or A.P.C with Codeine Codeine Phosphate Capsules #3 Ibuprofen Ridaura  ABC compound Congesprin Imuran rimadil  Advil Cope Indocin Robaxisal  Alka-Seltzer Effervescent Pain Reliever and Antacid Coricidin or Coricidin-D  Indomethacin Rufen  Alka-Seltzer plus Cold Medicine Cosprin Ketoprofen S-A-C Tablets  Anacin Analgesic Tablets or Capsules Coumadin Korlgesic Salflex  Anacin Extra Strength Analgesic tablets or capsules CP-2 Tablets Lanoril Salicylate  Anaprox Cuprimine Capsules Levenox Salocol  Anexsia-D Dalteparin Magan Salsalate  Anodynos Darvon compound Magnesium Salicylate Sine-off  Ansaid Dasin Capsules Magsal Sodium Salicylate  Anturane Depen Capsules Marnal Soma  APF Arthritis pain formula Dewitt's Pills Measurin Stanback  Argesic Dia-Gesic Meclofenamic Sulfinpyrazone  Arthritis Bayer Timed Release Aspirin Diclofenac Meclomen Sulindac  Arthritis pain formula Anacin Dicumarol Medipren Supac  Analgesic (Safety coated) Arthralgen Diffunasal Mefanamic Suprofen  Arthritis Strength Bufferin Dihydrocodeine Mepro Compound Suprol  Arthropan liquid Dopirydamole Methcarbomol with Aspirin Synalgos  ASA tablets/Enseals Disalcid Micrainin Tagament  Ascriptin Doan's  Midol Talwin  Ascriptin A/D Dolene Mobidin Tanderil  Ascriptin Extra Strength Dolobid Moblgesic Ticlid  Ascriptin with Codeine Doloprin or Doloprin with Codeine Momentum Tolectin  Asperbuf Duoprin Mono-gesic Trendar  Aspergum Duradyne Motrin or Motrin IB Triminicin  Aspirin plain, buffered or enteric coated Durasal Myochrisine Trigesic  Aspirin Suppositories Easprin Nalfon Trillsate  Aspirin with Codeine Ecotrin Regular or Extra Strength Naprosyn Uracel  Atromid-S Efficin Naproxen Ursinus  Auranofin Capsules Elmiron Neocylate Vanquish  Axotal Emagrin Norgesic Verin  Azathioprine Empirin or Empirin with Codeine Normiflo Vitamin E  Azolid Emprazil Nuprin Voltaren  Bayer Aspirin plain, buffered or children's or timed BC Tablets or powders Encaprin Orgaran Warfarin Sodium  Buff-a-Comp Enoxaparin Orudis Zorpin  Buff-a-Comp with Codeine Equegesic Os-Cal-Gesic   Buffaprin Excedrin plain, buffered or Extra Strength Oxalid   Bufferin Arthritis Strength Feldene Oxphenbutazone   Bufferin plain or  Extra Strength Feldene Capsules Oxycodone with Aspirin   Bufferin with Codeine Fenoprofen Fenoprofen Pabalate or Pabalate-SF   Buffets II Flogesic Panagesic   Buffinol plain or Extra Strength Florinal or Florinal with Codeine Panwarfarin   Buf-Tabs Flurbiprofen Penicillamine   Butalbital Compound Four-way cold tablets Penicillin   Butazolidin Fragmin Pepto-Bismol   Carbenicillin Geminisyn Percodan   Carna Arthritis Reliever Geopen Persantine   Carprofen Gold's salt Persistin   Chloramphenicol Goody's Phenylbutazone   Chloromycetin Haltrain Piroxlcam   Clmetidine heparin Plaquenil   Cllnoril Hyco-pap Ponstel   Clofibrate Hydroxy chloroquine Propoxyphen         Before stopping any of these medications, be sure to consult the physician who ordered them.  Some, such as Coumadin (Warfarin) are ordered to prevent or treat serious conditions such as "deep thrombosis", "pumonary embolisms", and other heart  problems.  The amount of time that you may need off of the medication may also vary with the medication and the reason for which you were taking it.  If you are taking any of these medications, please make sure you notify your pain physician before you undergo any procedures.         Pain Management Discharge Instructions  General Discharge Instructions :  If you need to reach your doctor call: Monday-Friday 8:00 am - 4:00 pm at [phone] or toll free [phone].  After clinic hours [phone] to have operator reach doctor.  Bring all of your medication bottles to all your appointments in the pain clinic.  To cancel or reschedule your appointment with Pain Management please remember to call 24 hours in advance to avoid a fee.  Refer to the educational materials which you have been given on: General Risks, I had my Procedure. Discharge Instructions, Post Sedation.  Post Procedure Instructions:  The drugs you were given will stay in your system until tomorrow, so for the next 24 hours you should not drive, make any legal decisions or drink any alcoholic beverages.  You may eat anything you prefer, but it is better to start with liquids then soups and crackers, and gradually work up to solid foods.  Please notify your doctor immediately if you have any unusual bleeding, trouble breathing or pain that is not related to your normal pain.  Depending on the type of procedure that was done, some parts of your body may feel week and/or numb.  This usually clears up by tonight or the next day.  Walk with the use of an assistive device or accompanied by an adult for the 24 hours.  You may use ice on the affected area for the first 24 hours.  Put ice in a Ziploc bag and cover with a towel and place against area 15 minutes on 15 minutes off.  You may switch to heat after 24 hours.Selective Nerve Root Block Patient Information  Description: Specific nerve roots exit the spinal canal  and these nerves can be compressed and inflamed by a bulging disc and bone spurs.  By injecting steroids on the nerve root, we can potentially decrease the inflammation surrounding these nerves, which often leads to decreased pain.  Also, by injecting local anesthesia on the nerve root, this can provide [place] helpful information to give to your referring doctor if it decreases your pain.  Selective nerve root blocks can be done along the spine from the neck to the low back depending on the location of your pain.   After numbing the skin with local anesthesia, a small needle  is passed to the nerve root and the position of the needle is verified using x-ray pictures.  After the needle is in correct position, we then deposit the medication.  You may experience a pressure sensation while this is being done.  The entire block usually lasts less than 15 minutes.  Conditions that may be treated with selective nerve root blocks:  Low back and leg pain  Spinal stenosis  Diagnostic block prior to potential surgery  Neck and arm pain  Post laminectomy syndrome  Preparation for the injection:  1. Do not eat any solid food or dairy products within 8 hours of your appointment. 2. You may drink clear liquids up to 3 hours before an appointment.  Clear liquids include water, black coffee, juice or soda.  No milk or cream please. 3. You may take your regular medications, including pain medications, with a sip of water before your appointment.  Diabetics should hold regular insulin (if taken separately) and take 1/2 normal NPH dose the morning of the procedure.  Carry some sugar containing items with you to your appointment. 4. A driver must accompany you and be prepared to drive you home after your procedure. 5. Bring all your current medications with you. 6. An IV may be inserted and sedation may be given at the discretion of the physician. 7. A blood pressure cuff, EKG, and other monitors will often be applied  during the procedure.  Some patients may need to have extra oxygen administered for a short period. 8. You will be asked to provide medical information, including allergies, prior to the procedure.  We must know immediately if you are taking blood  Thinners (like Coumadin) or if you are allergic to IV iodine contrast (dye).  Possible side-effects: All are usually temporary  Bleeding from needle site  Light headedness  Numbness and tingling  Decreased blood pressure  Weakness in arms/legs  Pressure sensation in back/neck  Pain at injection site (several days)  Possible complications: All are extremely rare  Infection  Nerve injury  Spinal headache (a headache wore with upright position)  Call if you experience:  Fever/chills associated with headache or increased back/neck pain  Headache worsened by an upright position  New onset weakness or numbness of an extremity below the injection site  Hives or difficulty breathing (go to the emergency room)  Inflammation or drainage at the injection site(s)  Severe back/neck pain greater than usual  New symptoms which are concerning to you  Please note:  Although the local anesthetic injected can often make your back or neck feel good for several hours after the injection the pain will likely return.  It takes 3-5 days for steroids to work on the nerve root. You may not notice any pain relief for at least one week.  If effective, we will often do a series of 3 injections spaced 3-6 weeks apart to maximally decrease your pain.    If you have any questions, please call [phone] [facility] Clinic[facility]ive [facility] Root Block Patient Information  Description: Specific nerve roots exit the spinal canal and these nerves can be compressed and inflamed by a bulging disc and bone spurs.  By injecting steroids on the nerve root, we can potentially decrease the inflammation surrounding these nerves,  which often leads to decreased pain.  Also, by injecting local anesthesia on the nerve root, this can provide [place] helpful information to give to your referring doctor if it decreases your pain.  Selective  nerve root blocks can be done along the spine from the neck to the low back depending on the location of your pain.   After numbing the skin with local anesthesia, a small needle is passed to the nerve root and the position of the needle is verified using x-ray pictures.  After the needle is in correct position, we then deposit the medication.  You may experience a pressure sensation while this is being done.  The entire block usually lasts less than 15 minutes.  Conditions that may be treated with selective nerve root blocks:  Low back and leg pain  Spinal stenosis  Diagnostic block prior to potential surgery  Neck and arm pain  Post laminectomy syndrome  Preparation for the injection:  9. Do not eat any solid food or dairy products within 8 hours of your appointment. 10. You may drink clear liquids up to 3 hours before an appointment.  Clear liquids include water, black coffee, juice or soda.  No milk or cream please. 11. You may take your regular medications, including pain medications, with a sip of water before your appointment.  Diabetics should hold regular insulin (if taken separately) and take 1/2 normal NPH dose the morning of the procedure.  Carry some sugar containing items with you to your appointment. 12. A driver must accompany you and be prepared to drive you home after your procedure. 74. Bring all your current medications with you. 14. An IV may be inserted and sedation may be given at the discretion of the physician. 15. A blood pressure cuff, EKG, and other monitors will often be applied during the procedure.  Some patients may need to have extra oxygen administered for a short period. 61. You will be asked to provide medical information, including allergies, prior to the  procedure.  We must know immediately if you are taking blood  Thinners (like Coumadin) or if you are allergic to IV iodine contrast (dye).  Possible side-effects: All are usually temporary  Bleeding from needle site  Light headedness  Numbness and tingling  Decreased blood pressure  Weakness in arms/legs  Pressure sensation in back/neck  Pain at injection site (several days)  Possible complications: All are extremely rare  Infection  Nerve injury  Spinal headache (a headache wore with upright position)  Call if you experience:  Fever/chills associated with headache or increased back/neck pain  Headache worsened by an upright position  New onset weakness or numbness of an extremity below the injection site  Hives or difficulty breathing (go to the emergency room)  Inflammation or drainage at the injection site(s)  Severe back/neck pain greater than usual  New symptoms which are concerning to you  Please note:  Although the local anesthetic injected can often make your back or neck feel good for several hours after the injection the pain will likely return.  It takes 3-5 days for steroids to work on the nerve root. You may not notice any pain relief for at least one week.  If effective, we will often do a series of 3 injections spaced 3-6 weeks apart to maximally decrease your pain.    If you have any questions, please call [phone] [facility]

## 2020-06-21 ENCOUNTER — Encounter: Payer: Medicare Other | Admitting: Pain Medicine

## 2020-06-23 ENCOUNTER — Other Ambulatory Visit: Payer: Self-pay

## 2020-06-23 ENCOUNTER — Ambulatory Visit (INDEPENDENT_AMBULATORY_CARE_PROVIDER_SITE_OTHER): Payer: Medicare Other

## 2020-06-23 DIAGNOSIS — I482 Chronic atrial fibrillation, unspecified: Secondary | ICD-10-CM

## 2020-06-23 LAB — POCT INR
INR: 2.8 (ref 2.0–3.0)
PT: 33.4

## 2020-06-23 NOTE — Patient Instructions (Signed)
Description   Restart 3mg  daily, f/u 4 weeks .

## 2020-06-24 ENCOUNTER — Telehealth: Payer: Self-pay | Admitting: *Deleted

## 2020-06-24 ENCOUNTER — Telehealth: Payer: Self-pay | Admitting: Pain Medicine

## 2020-06-24 ENCOUNTER — Telehealth: Payer: Self-pay | Admitting: Family Medicine

## 2020-06-24 NOTE — Telephone Encounter (Signed)
Pt is calling and need medical clearance to stop her warfarin 5 days prior to having the procedure on 07-01-2020 with her pain management doctor. Pt said they fax over clearance form per pt they need the clearance by today. Please advise.

## 2020-06-24 NOTE — Telephone Encounter (Signed)
Spoke with [place], she was wanting to speak to [place] and I think that was re; stopping warfarin prior to procedure on 07/01/20.  There was a clearance that was sent by [facility] on 06/16/20 that I have not seen any response to.  I asked the patient to please notify Dr Marlan Palau office to see if they would give her clearance to stop the warfarin 5 days prior to procedure.  She states she will do that and ask them to please send that back.  Multiple questions answered re; procedure and patient verbalizes u/o information.

## 2020-06-24 NOTE — Telephone Encounter (Signed)
Please advise 

## 2020-06-24 NOTE — Telephone Encounter (Signed)
Patient called back and is very concerned about coming off the Warfarin prior to the epidural.  I have sent medical clearance to DR [facility] office but have yet to hear back from them.  Patient is asking if she should go on a bridge prior to procedure.  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  I have told her that if she is that concerned that maybe we need to reschedule the procedure, rather than trying to rush to get an answer.  I told her that we could wait to see if we hear from Dr Caryn Section and I will call her back in the morning.

## 2020-06-24 NOTE — Telephone Encounter (Signed)
Pt stated she can not have her procedure done without a clearance form. Pt stated [place] [facility] faxed over a document today, please advise if it has been received.

## 2020-06-24 NOTE — Telephone Encounter (Signed)
This is not my patient and I am not in office today. If they need this form today then please forward to Dr. Jacinto Reap

## 2020-06-24 NOTE — Telephone Encounter (Signed)
Clearance form being refax asking Dr Caryn Section to please address since Dr Rosanna Randy is out and patient need to stop medication on 06/26/20. Pain Management need form back today or tomorrow

## 2020-06-24 NOTE — Telephone Encounter (Signed)
Medical clearance resent attention Dr Caryn Section to stop Warfarin prior to procedure on next Thursday.

## 2020-06-24 NOTE — Telephone Encounter (Signed)
Tracy Mann stated that Clearance for [facility] pain management center was given before from Dr. Rosanna Randy in November 2021 /  Tracy Mann called to see if she could know something from Dr. Caryn Section today or first thing in the morning to let the pt know / please advise

## 2020-06-25 ENCOUNTER — Telehealth: Payer: Self-pay | Admitting: Cardiovascular Disease

## 2020-06-25 ENCOUNTER — Telehealth: Payer: Self-pay | Admitting: *Deleted

## 2020-06-25 NOTE — Telephone Encounter (Signed)
Fax on your desk to review. Thanks!

## 2020-06-25 NOTE — Telephone Encounter (Signed)
Has this form been faxed?

## 2020-06-25 NOTE — Telephone Encounter (Signed)
Pain Management called in to check on Clearance form that was refax asking Dr Caryn Section to please address since Dr Rosanna Randy is out. Please advise

## 2020-06-25 NOTE — Telephone Encounter (Signed)
Called patient back and she has not heard back from Dr Marlan Palau office and I have not either.  I told her not to stop her medications until we get approval to do so.  I told her that we would need to probably reschedule.  I will call Dr Marlan Palau again to see if they have the form and have just not sent it back.   The medical clearance is on DR [employer] and we will to hear to back.

## 2020-06-25 NOTE — Telephone Encounter (Signed)
Call transferred directly to triage from scheduling. I spoke with the patient who called with complaints of progressively worsening SOB and bilateral swelling to her feet/ ankles/ slightly up her legs (L>R).  The patient states her SOB is worse intermittently and she cannot understand why her fluid pills are not working anymore. She feels she will diureses some days and not others. She is currently taking lasix 20 mg once daily. She is also prescribed metolazone 2.5 mg 2x/ week as needed. The patient has not taken this since last week.  She has not weighed herself in "a few weeks."  She feels she needs to be seen to address her ongoing swelling. She is also pending a steroid injection, but will need to come off warfarin x 5 days to have this done and she is "scared to death" to stop warfarin.  She currently takes this for atrial fibrillation.  I have advised the patient that since her symptoms are not acutely worse I can add her to the schedule to see Marrianne Mood, PA on 6/1 at 9:00 am. I have advised her to weigh daily starting tomorrow and bring these with her to that appointment.  I have also advised her to take her metolazone tomorrow & Tuesday 30 minutes prior to her furosemide to see if this helps.  The patient voices understanding of the above recommendations and is agreeable.

## 2020-06-25 NOTE — Telephone Encounter (Signed)
It was signed and sent to medical records to be faxed.

## 2020-06-25 NOTE — Telephone Encounter (Signed)
Patient c/o Palpitations:  High priority if patient c/o lightheadedness, shortness of breath, or chest pain  1) How long have you had palpitations/irregular HR/ Afib? Are you having the symptoms now? 6 weeks  2) Are you currently experiencing lightheadedness, SOB or CP? Lightheadedness when she stands up, SOB but no chest pain now but sometimes  3) Do you have a history of afib (atrial fibrillation) or irregular heart rhythm? yes  4) Have you checked your BP or HR? (document readings if available): haven't check recently   5) Are you experiencing any other symptoms? Swelling   Patient calling - patient also concerned about coming off warfarin that the pain clinic has suggested for an upcoming injection.

## 2020-06-29 ENCOUNTER — Telehealth: Payer: Self-pay | Admitting: Pain Medicine

## 2020-06-29 NOTE — Telephone Encounter (Signed)
Spoke to patient to let her know that I did receive the medical clearance back from Dr Rosanna Randy and it was approved for her to stop her Warfarin x 5 days.  They did not mention anything about a lovenox bridge.  She states that she is going to her cardiologist tomorrow to talk to them about going off warfarin.  She is very concerned that she should be placed on a lovenox bridge and doesn't feel comfortable proceeding without that.  I told her that is a good idea.  I have told Tracy Mann that this is an elective procedure and not something we should rush.  I told her that we want to proceed as safely as possible and only when she feels comfortable.  Tracy Tracy Mann u/o information and will call back tomorrow with information from cardiology office.

## 2020-06-30 ENCOUNTER — Other Ambulatory Visit: Payer: Self-pay

## 2020-06-30 ENCOUNTER — Encounter: Payer: Self-pay | Admitting: Physician Assistant

## 2020-06-30 ENCOUNTER — Ambulatory Visit: Payer: Medicare Other | Admitting: Physician Assistant

## 2020-06-30 VITALS — BP 110/50 | HR 85 | Ht 65.5 in | Wt 220.0 lb

## 2020-06-30 DIAGNOSIS — I25118 Atherosclerotic heart disease of native coronary artery with other forms of angina pectoris: Secondary | ICD-10-CM

## 2020-06-30 DIAGNOSIS — I209 Angina pectoris, unspecified: Secondary | ICD-10-CM

## 2020-06-30 DIAGNOSIS — I2089 Other forms of angina pectoris: Secondary | ICD-10-CM

## 2020-06-30 DIAGNOSIS — Z794 Long term (current) use of insulin: Secondary | ICD-10-CM

## 2020-06-30 DIAGNOSIS — I10 Essential (primary) hypertension: Secondary | ICD-10-CM

## 2020-06-30 DIAGNOSIS — I5042 Chronic combined systolic (congestive) and diastolic (congestive) heart failure: Secondary | ICD-10-CM

## 2020-06-30 DIAGNOSIS — Z7901 Long term (current) use of anticoagulants: Secondary | ICD-10-CM

## 2020-06-30 DIAGNOSIS — R002 Palpitations: Secondary | ICD-10-CM | POA: Diagnosis not present

## 2020-06-30 DIAGNOSIS — G4733 Obstructive sleep apnea (adult) (pediatric): Secondary | ICD-10-CM

## 2020-06-30 DIAGNOSIS — Z789 Other specified health status: Secondary | ICD-10-CM

## 2020-06-30 DIAGNOSIS — R5382 Chronic fatigue, unspecified: Secondary | ICD-10-CM

## 2020-06-30 DIAGNOSIS — I482 Chronic atrial fibrillation, unspecified: Secondary | ICD-10-CM | POA: Diagnosis not present

## 2020-06-30 DIAGNOSIS — E1159 Type 2 diabetes mellitus with other circulatory complications: Secondary | ICD-10-CM | POA: Diagnosis not present

## 2020-06-30 DIAGNOSIS — E785 Hyperlipidemia, unspecified: Secondary | ICD-10-CM

## 2020-06-30 DIAGNOSIS — R0602 Shortness of breath: Secondary | ICD-10-CM

## 2020-06-30 DIAGNOSIS — I208 Other forms of angina pectoris: Secondary | ICD-10-CM

## 2020-06-30 MED ORDER — TORSEMIDE 20 MG PO TABS: 20.0000 mg | ORAL_TABLET | Freq: Every day | ORAL | 3 refills | Status: DC

## 2020-06-30 NOTE — Patient Instructions (Signed)
Medication Instructions:  Continue your Torsemide 20 mg Daily  *If you need a refill on your cardiac medications before your next appointment, please call your pharmacy*   Lab Work: BMT & BNP   Testing/Procedures: Your physician has requested that you have an echocardiogram. Echocardiography is a painless test that uses sound waves to create images of your heart. It provides your doctor with information about the size and shape of your heart and how well your heart's chambers and valves are working. This procedure takes approximately one hour. There are no restrictions for this procedure.  There is a possibility that an IV may need to be started during your test to inject an image enhancing agent. This is done to obtain more optimal pictures of your heart. Therefore we ask that you do at least drink some water prior to coming in to hydrate your veins.   [place]  Your caregiver has ordered a Stress Test with nuclear imaging. The purpose of this test is to evaluate the blood supply to your heart muscle. This procedure is referred to as a "Non-Invasive Stress Test." This is because other than having an IV started in your vein, nothing is inserted or "invades" your body. Cardiac stress tests are done to find areas of poor blood flow to the heart by determining the extent of coronary artery disease (CAD). Some patients exercise on a treadmill, which naturally increases the blood flow to your heart, while others who are  unable to walk on a treadmill due to physical limitations have a pharmacologic/chemical stress agent called Lexiscan . This medicine will mimic walking on a treadmill by temporarily increasing your coronary blood flow.   Please note: these test may take anywhere between 2-4 hours to complete  PLEASE REPORT TO [place] TO GO  Instructions regarding medication:   _X___ : Hold diabetes medication morning  of procedure  Take only HALF dose of insulin night of procedure  NO insulin morning of procedure  ____:  Hold betablocker(s) night before procedure and morning of procedure  _X__:  Hold other medications as follows:  Torsemide (fluid pill)  May take after   PLEASE NOTIFY THE OFFICE AT LEAST 24 HOURS IN ADVANCE IF YOU ARE UNABLE TO KEEP YOUR APPOINTMENT.  [phone] AND  PLEASE NOTIFY NUCLEAR MEDICINE AT [facility] AT LEAST 24 HOURS IN ADVANCE IF YOU ARE UNABLE TO KEEP YOUR APPOINTMENT. [phone]  How to prepare for your Myoview test:  1. Do not eat or drink after midnight 2. No caffeine for 24 hours prior to test 3. No smoking 24 hours prior to test. 4. Your medication may be taken with water.  If your doctor stopped a medication because of this test, do not take that medication. 5. Please were comfortable pants. Please wear a short sleeve shirt. 6. No perfume, cologne or lotion. 7. Wear comfortable walking shoes. No heels!   Follow-Up: At [facility], you and your health needs are our priority.  As part of our continuing mission to provide you with exceptional heart care, we have created designated Provider Care Teams.  These Care Teams include your primary Cardiologist (physician) and Advanced Practice Providers (APPs -  Physician Assistants and Nurse Practitioners) who all work together to provide you with the care you need, when you need it.  We recommend signing up for the patient portal called "MyChart".  Sign up information is provided on this After Visit  Summary.  MyChart is used to connect with patients for Virtual Visits (Telemedicine).  Patients are able to view lab/test results, encounter notes, upcoming appointments, etc.  Non-urgent messages can be sent to your provider as well.   To learn more about what you can do with MyChart, go to [email or web address].    Your next appointment:   After your echo and stress test  The format for your next appointment:    In Person  Provider:   Marrianne Mood, PA-C   Other Instructions We will reach out to our anticoagulation pool regarding Lovenox and the epidural

## 2020-06-30 NOTE — Progress Notes (Signed)
Office Visit    Patient Name: Tracy Mann Date of Encounter: 06/30/2020  PCP:  Jerrol Banana., MD   [place]  Cardiologist:  Ida Rogue, MD  Advanced Practice Provider:  No care team member to display Electrophysiologist:  None  :[id number]}   Chief Complaint    Chief Complaint  Patient presents with  . Other    Patient c.o increased SOB and swelling in ankles. Meds reviewed verbally with patient.     87 y.o. female with history of chronic atrial fibrillation on Coumadin (INR managed by PCP), chronic combined systolic and diastolic heart failure, pulmonary hypertension, hypertension, hyperlipidemia, morbid obesity, CAD s/p inferior wall MI 2007 felt to be secondary to thrombus from atrial fibrillation, chronic lower extremity edema with lymphedema pumps, and who presents today for increased shortness of breath and report of ankle edema.  Past Medical History    Past Medical History:  Diagnosis Date  . Acute myocardial infarction of inferior wall ([place])    a. 10/2005 - Presumed to be 2/2 to a coronary embolus in setting of persistent Afib-->Cath 2014 relatively nl cors, EF 45-50% w/ severe distal inferior/apical inferior HK w/ aneursymal appearance.  . Arthritis   . Chronic anticoagulation    a. warfarin.  . Chronic atrial fibrillation (HCC)    a. CHA2DS2VASc = 6--> warfarin.  . Chronic combined systolic and diastolic CHF (congestive heart failure) ([place])    a. 10/2012 Echo: EF 35-40%, diff HK, mild MR, mild biatrial enlargement;  b. 11/2012 EF 45-50% by LV gram; c. echo 07/2014: EF 40-45%, mod conc LVH, mod MR, severe biatrial enlargement, PASP 45 mm Hg c. Echo 01/2018 EF 45-50%, duffuse hypokinesis, mild MR, LA mod dilated, norm RVSF, dilated IVC  . Essential hypertension   . Hyperlipidemia, mixed   . Mixed Ischemic/Nonischemic Cardiomyopathy    a. 10/2012 Echo: EF 35-40%;  b. 11/2012 EF 45-50% by LV gram.  . Nonobstructive coronary  artery disease    a. 2007 inferior wall MI thought to be secondary to thrombus from atrial fib b. 2014 cath with R dominant coronary arterial system with mild luminal irregularities c. Myoview 05/2015 old inferior MI, no new ischemic changes  . Obesity   . Thyroid disease    hypothyroidism  . Type II diabetes mellitus ([place])    a. 07/2018 A1c 8.7 - long term insulin use  . Venous insufficiency   . Vitamin D deficiency    Past Surgical History:  Procedure Laterality Date  . CARDIAC CATHETERIZATION  11/2012   [facility];EF 45-50%  . CHOLECYSTECTOMY  1999  . TUBAL LIGATION  1968  . VESICOVAGINAL FISTULA CLOSURE W/ TAH  1996    Allergies  Allergies  Allergen Reactions  . Duloxetine Other (See Comments)    Same as Paroxetine  . Other Anaphylaxis and Swelling    'tongue swelling'  . Paroxetine Other (See Comments)    "Horrible headache," Stomach pain, Diarrhea  . Sulfa Antibiotics Anaphylaxis  . Cephalexin     Blisters  . Clarithromycin Other (See Comments)    Hives, headaches, hard time swelling, felt like throat was closing up Hives, headaches, hard time swelling, felt like throat was closing up  . Diphenhydramine Other (See Comments)  . Estrogens Conjugated   . Estrogens, Conjugated Other (See Comments)  . Sulfonamide Derivatives Swelling    'tongue swelling'  . Nitrofurantoin Nausea Only    History of Present Illness    Tracy Mann is  a 87 y.o. female with PMH as above.  She has history of chronic atrial fibrillation on Coumadin, chronic combined systolic and diastolic heart failure, hypertension, hyperlipidemia, morbid obesity, moderate pulmonary hypertension, CAD s/p inferior wall MI 2007 felt to be secondary to thrombus from atrial fibrillation, lower extremity edema/lymphedema/DM2/and arthralgias.  01/2018 echo LVEF 40 to 45%, diffuse hypokinesis, mild MR, moderate LAE.  Cardiac monitoring with Zio monitor 07/2018 with persistent atrial fibrillation with variable rates.   Pauses up to 4.2 seconds noted.  05/2019 telephone encounter with shortness of breath.  Home oxygen saturation 85% with ambulation and low 90s at rest.  She reported legs were heavy and hard as a rock.  She was frustrated at never receiving a CPAP despite history of obstructive sleep apnea.  During her 03/2019 visit, she was noted to be 235 pounds, compared to her previous visit a few months prior with weight 227 pounds.  She reported inability to wear compression hose.  She had recurrent difficulties with UTIs and urethral irritation and expressed a desire to switch off of Lasix. Seen 05/22/2019 with dyspnea and fluid retention 2/2 her inability to take Lasix as prescribed.  She reported Lasix as an irritating to her interstitial cystitis.  Lasix was increased to 40 mg daily.  Seen 05/26/2019 with metolazone 2.5 mg every other day added to her medications.  She was switched to torsemide.  Subsequent follow-up visits noted that she was taking her diuretic inconsistently, leading to periodic weight gain.  Adherence to diuretic regimen was encouraged.   01/27/2020 phone note reports patient called in with shortness of breath x1 month.  She reported weakness and that her heart rate was 50 to 101 bpm with oxygen saturations 83%.  She felt as if her legs were frequently giving out on her.  She reported confusion.  Recommendation was to take an extra dose of her torsemide to help with her shortness of breath.  Telephone note same day indicates that on call back patient reported she did not take her torsemide that morning, as she was getting her hair done.  She stated that she would take torsemide 40 mg when home.    02/19/2020 COVID-19 positive.  She was last seen by her primary cardiologist 03/15/2020.  She was tolerating torsemide 20 mg daily well with the additional metolazone, though this was listed as metolazone 2.5 mg twice daily, rather than the previously noted metolazone 2.5 mg every other day.  She reported  feeling a tightness across her bra area.  She was still getting over COVID-19.  She reported malaise, nausea, coughing.  She felt like she had the flu.  She had increased palpitations.  For a while, she reported inability to eat with her weight down 9 pounds over the last few months.  She reported following up with the pain clinic for chronic back pain.  She had lymphedema pumps but was not using these for her leg swelling.  Hemoglobin A1c was noted to be poorly controlled with patient on insulin and following with endocrinology.  EKG significant for atrial fibrillation with controlled ventricular rate.  Recommendations were to continue torsemide 20 mg daily with metolazone 2.5 mg every other day, rather than twice per day.  It was recommended she use her lymphedema pumps.  She was instructed to take an extra torsemide as needed in the afternoon for worsening bilateral edema.  She was noted to have some additional swelling in clinic with recommendation for 2 to 3 days of extra torsemide in  the afternoon to start with after her clinic visit.  She was continued on Zetia and statin.  BP noted to be well controlled.  She reported her sleep apnea as "not a major issue."  PVCs were noted on EKG but with patient reports she was asymptomatic.  She called the clinic 06/25/20 and expressed frustration regarding progressive dyspnea and bilateral swelling to her feet, ankles, and legs with left greater than right.  She reported inconsistent shortness of breath and that her fluid pills were not working well on some days when compared with others.  She was not weighing herself at home.  As noted below, and at that time, it was documented she was taking furosemide 20mg  daily (changed by the pain clinic with pt actually on Torsemide). Thus, the phone note reports her taking lasix.  As the phone note indicates, she also discussed needing a steroid injection in the near future with recommendation she, for warfarin for 5 days.  She  was concerned regarding stopping warfarin and wanted to discuss this.  She reported not weighing herself over the last few weeks with recommendation to start tracking her weights.Recommendation was for her to take her metolazone on Saturday and Tuesday and 30 minutes apart from her diuretic.    -------------------------------------------------  Today, 06/30/2020, she returns to clinic with report of progressive shortness of breath and lower extremity edema.   --Medication confusion is noted, as well as intermittent skipped doses = She reports taking torsemide, despite the fact that her medication list now lists furosemide 20 mg daily.  On further discussion, she confirms the fact that she was transitioned off of furosemide 20 mg daily to torsemide 20 mg daily, given furosemide results in irritation of her interstitial cystitis.  On further review of EMR, there seems to be some medication confusion that has resulted in the pain clinic updating her medication list to include furosemide, rather than her torsemide.  When speaking with her today, she sometimes refers to torsemide as furosemide, which may be the reason her medication list was updated to lasix. She reports taking her metolazone twice per week.  On review of medications, and in addition to discovering the medication confusion regarding Lasix versus torsemide, she reports that she does skip a day of her fluid pill whenever she goes out. For example, she did not take her fluid pill today.  She reports multiple sx today.   --She has fatigue and often feels weak and tired.   --She has chest discomfort that comes and goes and is different from previous chest discomfort.  She reports in the past, she has had chest pain in the center of her chest.  Recently, her chest pain has now started to occur on the left side of her chest.  This has occurred twice now with activity and lasted for approximately 30 minutes, at which time she takes a sublingual nitro  with relief of her symptoms within the next few minutes.  She denies any radiation down her arm but does report some radiation into her neck.  She denies any pleuritic chest pain or any change in the chest pain with change in position.  No associated diaphoresis. She reports associated SOB at that time.   --She reports feeling as if she is holding onto fluid.  Her shortness of breath/dyspnea has escalated to the point where she reports struggling to walk across the room.   She reports that her oxygen saturation is dipping at home, occasionally reporting an oxygen saturation of  50%.  We discussed that this may be due to an error in her pulse ox machine or her PVCs and seeing her HR. She denies that this is associated with her HR. She reports significant lower extremity edema L>R that does not improve when she puts her legs up. No lymphedema pump use. She has considered buying a new left shoe for her worsening swelling on the left. She had difficulty getting her shoe on today. She reports bilateral lower extremity paresthesias with significant pain in her leg, which we discussed may be due to her A1C. She reports orthopnea, frequently sleeping at least a 45 degree angle and sometimes sleeping in a recliner chair.  She has abdominal distention and often feels full, especially with bending over. She reports PND, frequently waking up short of breath.  She states that this is different from her apnea.   --She reports presyncope, racing HR, and palpitations. She recently tried to go to the grocery store and reports that she almost passed out due to weakness, reporting feeling as if her legs went weak and almost gave out underneath her.  She was SOB and nauseated. Her heart was racing. She feels as if she might have been dizzy but mainly reports the weakness as the primary feeling at that time. She reports palpitations, in addition to racing heart rate.    No recent falls.  No signs or symptoms of bleeding.  She  brings a log of weights with her today and wt every day since that time in order of date: 224.6, 223.8, 221.6, and 219.4 as of the last few days.  She confirms she has taken her torsemide every day since Saturday but states she did not take her torsemide today, as she was going to be out of the house. We discussed that she needs to take it daily going forward as below.   Home Medications   Current Outpatient Medications  Medication Instructions  . ACCU-CHEK AVIVA PLUS test strip USE WITH METER TO CHECK GLUCOSE BEFORE MEALS AND AT BEDTIME  . albuterol (VENTOLIN HFA) 108 (90 Base) MCG/ACT inhaler TAKE 2 PUFFS BY MOUTH EVERY 6 HOURS AS NEEDED FOR WHEEZE OR SHORTNESS OF BREATH  . estradiol (ESTRACE) 0.1 MG/GM vaginal cream 1 Applicatorful, Vaginal, Daily at bedtime  . ezetimibe (ZETIA) 10 MG tablet TAKE 1 TABLET BY MOUTH EVERY DAY  . flavoxATE (URISPAS) 100 mg, Oral, 2 times daily PRN  . fluticasone (FLONASE) 50 MCG/ACT nasal spray SPRAY TWO SPRAYS IN EACH NOSTRIL ONCE DAILY  . HYDROcodone-acetaminophen (NORCO) 10-325 MG tablet 1 tablet, Oral, Every 6 hours PRN, Must last 30 days.  Derrill Memo ON 07/22/2020] HYDROcodone-acetaminophen (NORCO) 10-325 MG tablet 1 tablet, Oral, Every 6 hours PRN, Must last 30 days.  Derrill Memo ON 08/21/2020] HYDROcodone-acetaminophen (NORCO) 10-325 MG tablet 1 tablet, Oral, Every 6 hours PRN, Must last 30 days.  . insulin glargine (LANTUS SOLOSTAR) 100 UNIT/ML Solostar Pen Inject 20 units nightly between 8-10 PM. Adjust as directed. Max dose is 50 units daily.  . insulin lispro (HUMALOG KWIKPEN) 100 UNIT/ML KwikPen INJECT 10-20 UNITS THREE TIMES DAILY WITH EACH MEAL.  Marland Kitchen Insulin Pen Needle (RELION PEN NEEDLE 31G/8MM) 31G X 8 MM MISC Use with insulin pen three times daily  . levothyroxine (SYNTHROID) 112 mcg, Oral, Daily  . lisinopril (ZESTRIL) 2.5 MG tablet TAKE 1 TABLET BY MOUTH EVERYDAY AT BEDTIME  . LORazepam (ATIVAN) 1 mg, Oral, 2 times daily PRN  . Magnesium 1,000 mg,  Oral, Daily  .  metolazone (ZAROXOLYN) 2.5 mg, Oral, 2 times weekly, TAKE 30 minutes before Torsemide  . mupirocin ointment (BACTROBAN) 2 % Topical, Daily  . nitroGLYCERIN (NITROSTAT) 0.4 mg, Sublingual, Every 5 min PRN  . NONFORMULARY OR COMPOUNDED ITEM Diclofenac/Baclofen/Bu/Gabapentin Compounded Cream   . NONFORMULARY OR COMPOUNDED ITEM See pharmacy note- Not to use on any open skin or wound.  . phenazopyridine (PYRIDIUM) 200 mg, Oral, 3 times daily PRN  . potassium chloride SA (KLOR-CON) 20 MEQ tablet Take 1-2 tablets with metolazone   . torsemide (DEMADEX) 20 mg, Oral, Daily  . warfarin (COUMADIN) 3 mg, Oral, As directed     Review of Systems    She reports chest pain, palpitations, dyspnea, pnd, orthopnea, n, dizziness, edema, weight gain, early satiety. She denies emesis, syncope.   All other systems reviewed and are otherwise negative except as noted above.  Physical Exam    VS:  BP (!) 110/50 (BP Location: Right Arm, Patient Position: Sitting, Cuff Size: Normal)   Pulse 85   Ht 5' 5.5" (1.664 m)   Wt 220 lb (99.8 kg)   SpO2 90%   BMI 36.05 kg/m  , BMI Body mass index is 36.05 kg/m. GEN: Well nourished, well developed, in no acute distress. HEENT: normal. Neck: JVD difficult to assess due to body habitus. No carotid bruits, or masses. Cardiac: IRIR, no murmurs, rubs, or gallops. No clubbing, cyanosis. Bilateral moderate to 1+ edema L>R. Bilateral erythema noted without weeping. Radials/DP/PT 2+ and equal bilaterally.  Respiratory:  Respirations regular and unlabored, reduced breath sounds bilaterally. GI: Soft, nontender, nondistended, BS + x 4. MS: no deformity or atrophy. Skin: warm and dry, no rash. Neuro:  Strength and sensation are intact. Psych: Normal affect.  Accessory Clinical Findings    ECG personally reviewed by me today -atrial fibrillation with 1 PVC, poor R wave progression in the inferior leads II, 3, aVF, poor R wave progression in the precordial leads  V1 to V6, LAFB, QRS 138 ms with known LBBB,  left axis deviation-- no acute changes.  VITALS Reviewed today   Temp Readings from Last 3 Encounters:  06/16/20 97.7 F (36.5 C) (Temporal)  05/11/20 98.1 F (36.7 C) (Oral)  03/30/20 (!) 96.7 F (35.9 C) (Temporal)   BP Readings from Last 3 Encounters:  06/30/20 (!) 110/50  06/16/20 137/65  05/11/20 129/66   Pulse Readings from Last 3 Encounters:  06/30/20 85  06/16/20 (!) 105  05/11/20 80    Wt Readings from Last 3 Encounters:  06/30/20 220 lb (99.8 kg)  06/16/20 218 lb (98.9 kg)  05/11/20 217 lb (98.4 kg)     LABS  reviewed today   Lab Results  Component Value Date   WBC 10.9 (H) 01/09/2020   HGB 11.9 (L) 01/09/2020   HCT 37.0 01/09/2020   MCV 91.8 01/09/2020   PLT 225 01/09/2020   Lab Results  Component Value Date   CREATININE 1.02 (H) 01/09/2020   BUN 36 (H) 01/09/2020   NA 139 01/09/2020   K 4.2 01/09/2020   CL 98 01/09/2020   CO2 29 01/09/2020   Lab Results  Component Value Date   ALT 14 01/09/2020   AST 26 01/09/2020   ALKPHOS 106 01/09/2020   BILITOT 1.1 01/09/2020   Lab Results  Component Value Date   CHOL 172 01/17/2019   HDL 57 01/17/2019   LDLCALC 94 01/17/2019   TRIG 119 01/17/2019   CHOLHDL 3.0 01/17/2019    Lab Results  Component Value Date  HGBA1C 8.2 (H) 12/31/2019   Lab Results  Component Value Date   TSH <0.005 (L) 12/31/2019     STUDIES/PROCEDURES reviewed today   LE [place] of RLE 12/2019 IMPRESSION: No evidence of deep venous thrombosis in the right lower extremity. Left common femoral vein also patent.  07/2019 LE US venous reflux Summary:  Bilateral:  - No evidence of deep vein thrombosis seen in the lower extremities,  bilaterally, from the common femoral through the popliteal veins.  - No evidence of superficial venous thrombosis in the lower extremities,  bilaterally.  - No evidence of deep venous insufficiency seen bilaterally in the lower  extremity.  - No  evidence of superficial venous reflux seen in the greater saphenous  veins bilaterally.  - No evidence of superficial venous reflux seen in the short saphenous  veins bilaterally.   ZIO 08/30/18 Atrial fibrillation, 100% burden Rate ranging from 30-188 bpm (avg of 83 bpm).  12 Ventricular Tachycardia runs occurred, the run with the fastest interval lasting 12 beats with a max rate of 203 bpm, the longest lasting 8 beats with an avg rate of 133 bpm.   3 Pauses occurred, the longest lasting 4.2 secs (14 bpm). On 09/01/18: at 8:06 Am, 4.2 sec On 09/02/18 at 9:15 AM, 3.2 sec On 09/08/18 at 10:21 AM, 3.1 sec.  Isolated VEs were occasional (1.7%, [id number]), VE Couplets were rare (<1.0%, 2096), and VE Triplets were rare (<1.0%, 38). Ventricular Bigeminy and Trigeminy were present.  Echo 02/15/18 Left ventricle: The cavity size was normal. Systolic function was  mildly to moderately reduced. The estimated ejection fraction was  in the range of 40% to 45%. Diffuse hypokinesis. Regional wall  motion abnormalities cannot be excluded. The study is not  technically sufficient to allow evaluation of LV diastolic  function.  - Mitral valve: There was mild regurgitation.  - Left atrium: The atrium was moderately dilated.  - Right ventricle: Systolic function was normal.  - Inferior vena cava: The vessel was dilated. The respirophasic  diameter changes were blunted (< 50%), consistent with elevated  central venous pressure.    Assessment & Plan    Chronic systolic heart failure -- Reports multiple symptoms of volume overload today as outlined in detail above in the HPI.  02/15/2018 echo with LVEF 40 to 45%, mild MR, moderate LAE. Will repeat an echo as below given recent CP and ongoing DOE, as well as to reassess EF and rule out WMA or structural changes. No significant volume overload noted on exam. She has lost 5 pounds since consistently taking her diuretic since 5/27. BP soft. She  reports intermittent compliance with her torsemide, which she did not take today. Suspect that most of her sx are multifactorial and due at least in some part 2/2 COVID-19 and intermittently skipping her fluid pill and medication confusion as below. Compliance with diuretic discussed. Encouraged total daily fluids under 2L and salt under 2g.  Continue current torsemide 20mg  daily with additional 1 torsemide for weight gain over 3 pounds in 1 night or 5 pounds in 1 week. Continue current metolazone twice per week. Check a BMET to confirm Cr/K given recent increase to compliant dosing. Increased activity encouraged/lifestyle changes discussed.   Medication confusion --Currently taking torsemide 20mg  daily, though her medication list includes furosemide. Also taking metolazone 2.5 mg twice per week, rather than every other day.  She confirms today that she is indeed taking torsemide, though she occasionally calls it furosemide. Suspect this is the reason  that her medication list was changed to furosemide 20mg  daily at her pain clinic visit. We reviewed the difference between the medication names.  --We also discussed compliance with diuretic therapy and its relation to symptoms as above. We will check a BMET, given I am uncertain if she consistently takes her diuretic or her potassium supplement.  --We discussed her Coumadin and that her INR is checked via her family medicine practice, as the patient was under the impression that we were the ones to contact in relation to Lovenox bridging and her anticoagulation.  This was clarified today and after reaching out to the Coumadin clinic same day as her visit.  As below, we will be happy to manage her Coumadin/Lovenox bridge if she wishes to transfer to our Coumadin clinic now and for all future INR checks.  Otherwise, Lovenox bridge should be managed via her PCP.   Chest pain History of CAD -- Reports chest pain as detailed above in HPI.  Chest pain has both  typical and atypical features.  She is s/p inferior wall MI in 2007.  2017 Lexiscan Myoview low risk.  She does not recall how she felt prior to her MI.  She does note that this chest pain is different from chest pain experienced in the past.  Given her progressive dyspnea and chest pain, we will get an echo to confirm EF and rule out any wall motion abnormalities or acute structural change-valvular changes.  In addition, we will obtain a Lexiscan Myoview (*consent below) to rule out chest pain/shortness of breath due to coronary insufficiency. She does have RF for CAD, including known history of CAD, age, uncontrolled LDL/A1C.  EKG today stable from previous with poor R wave progression noted in the inferior and precordial leads, as well as known left bundle branch block.  Continue GDMT.  No ASA secondary to chronic anticoagulation with warfarin.  Hyperlipidemia, LDL goal below 70 Statin intolerance --Continue Zetia 10 mg daily.  She has declined transition to PCSK9 inhibitors.  Continue to reassess at RTC.  Hypertension, BP goal 130/80 or lower -- Current BP well controlled.  Continue current medications.  Chronic atrial fibrillation Chronic anticoagulation -- EKG today shows ongoing atrial fibrillation with ventricular rate well controlled.  She does report some racing heart rate, palpitations, and presyncope; however, she reports she has been asymptomatic with her atrial fibrillation in the past.  Continue as needed beta-blocker for palpitations.  Suspect that some of her symptoms are 2/2 her volume status given her poor compliance with her diuretic regimen and will improve with improvement in her volume status.  She is on chronic anticoagulation for CHA2DS2-VASc score of at least 7 (CHF, hypertension, age x2, DM2, CAD, female).  Continue current anticoagulation.  Will defer to her family medicine Coumadin clinic for Lovenox bridge in respect to her upcoming injection/epidural, unless she indicates  wanting to transition to our Coumadin clinic now and in the future.  DM2 -- Continue to follow with endocrinology.  -----------------  Shared Decision Making/Informed Consent The risks [chest pain, shortness of breath, cardiac arrhythmias, dizziness, blood pressure fluctuations, myocardial infarction, stroke/transient ischemic attack, nausea, vomiting, allergic reaction, radiation exposure, metallic taste sensation and life-threatening complications (estimated to be 1 in 10,000)], benefits (risk stratification, diagnosing coronary artery disease, treatment guidance) and alternatives of a nuclear stress test were discussed in detail with Ms. Marchiano and she agrees to proceed.  ---------------------  Medication changes: None - just updated medication list to correctly reflect her medications (see medication  confusion above) Labs ordered: BMET, BNP Studies / Imaging ordered: Echo, MPI Future considerations: She can change to our Coumadin clinic if desired in the future, otherwise Lovenox bridge per PCP Disposition: RTC after echo,, stress  *Please be aware that the above documentation was completed voice recognition software and may contain dictation errors.      Arvil Chaco, PA-C 06/30/2020

## 2020-07-01 ENCOUNTER — Ambulatory Visit: Payer: Medicare Other | Admitting: Pain Medicine

## 2020-07-01 ENCOUNTER — Telehealth: Payer: Self-pay | Admitting: Pain Medicine

## 2020-07-01 ENCOUNTER — Telehealth: Payer: Self-pay

## 2020-07-01 DIAGNOSIS — M4727 Other spondylosis with radiculopathy, lumbosacral region: Secondary | ICD-10-CM | POA: Insufficient documentation

## 2020-07-01 LAB — BASIC METABOLIC PANEL
BUN/Creatinine Ratio: 36 — ABNORMAL HIGH (ref 12–28)
BUN: 47 mg/dL — ABNORMAL HIGH (ref 8–27)
CO2: 25 mmol/L (ref 20–29)
Calcium: 9.6 mg/dL (ref 8.7–10.3)
Chloride: 98 mmol/L (ref 96–106)
Creatinine, Ser: 1.31 mg/dL — ABNORMAL HIGH (ref 0.57–1.00)
Glucose: 199 mg/dL — ABNORMAL HIGH (ref 65–99)
Potassium: 4.6 mmol/L (ref 3.5–5.2)
Sodium: 140 mmol/L (ref 134–144)
eGFR: 40 mL/min/{1.73_m2} — ABNORMAL LOW (ref >59)

## 2020-07-01 LAB — BRAIN NATRIURETIC PEPTIDE: BNP: 114.4 pg/mL — ABNORMAL HIGH (ref 0.0–100.0)

## 2020-07-01 NOTE — Telephone Encounter (Signed)
That's what I thought. I don't think I have ever seen anyone do the Lovenox for just the epidural.  Patient was told by cardiology to contact us about it and has not yet called. I will wait to see what Dr Darnell Level wants to do, unless patient calls sooner. Thanks for your help.

## 2020-07-01 NOTE — Telephone Encounter (Signed)
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  We do her PT/INR for dx of chronic A Fib.   The cardiologist's office sent me a message asking [place] to do a Lovenox bridging for her to have an epidural injection.   I will have to call the patient to find out when she is going to have this done. But, while awaiting that can you tell me if we do it the same way we did for the patient's with mech valve?  Thanks

## 2020-07-01 NOTE — Telephone Encounter (Signed)
They may not be saying that she needs bridge, but just that we will coordinate.

## 2020-07-01 NOTE — Progress Notes (Deleted)
Rescheduled by patient

## 2020-07-01 NOTE — Telephone Encounter (Signed)
Yes. We will dose Lovenox 1 mg/kg BID, hold Coumadin x5 days before procedure. Hold lovenox day of procedure. Resume coumadin and lovenox day after procedure and continue lovenox until INR therapy (check at day 5).  However, we do not typically bridge for a fib. It is typically ok ot just hold coumadin.  May want Dr Darnell Level to [facility] with Cardiology.

## 2020-07-01 NOTE — Telephone Encounter (Signed)
Patient called back to relay what cardiology said re; cessation of warfarin for procedure d/t back pain.  Dr Donivan Scull NP is going present this to the anticoagulation committee to see what their recommendation is.  She was just calling us back for update.  I told her we would wait to hear from them.

## 2020-07-02 ENCOUNTER — Telehealth: Payer: Self-pay | Admitting: Physician Assistant

## 2020-07-02 DIAGNOSIS — I5032 Chronic diastolic (congestive) heart failure: Secondary | ICD-10-CM

## 2020-07-02 NOTE — Telephone Encounter (Signed)
Tracy Mann D, PA-C  P C[facility] [place] Triage Labs show  --Renal function bumped from December of last year  --Glucose elevated at 199 with consideration that these were nonfasting labs, though this is still quite elevated and should be addressed if needed by her primary care physician with insulin adjustment if needed. I will include her PCP on this result note.   Recommendations: During her visit, there was some confusion regarding if the patient took Lasix 20 mg daily or torsemide 20 mg daily. She stated that she would call into the office to confirm after looking at her bottles that she is on torsemide 20 mg daily. I do not see that she has confirmed this for [place] yet. Please have her confirm that she is on torsemide 20 mg daily and only taking this as directed. Please also ensure that she is only taking her metolazone twice per week.   No medication changes were made at her actual visit.   We just confirmed what medication she was taking leading up to the visit.   If she is taking her diuretics as directed, recommend she hold her torsemide for 2 days (Saturday, Sunday) and repeat a BMET and BNP on Monday. If she is scheduled to take her metolazone over the next 2 days (if she was planning to take her metolazone over the weekend), she should hold this and her corresponding potassium as well.

## 2020-07-02 NOTE — Telephone Encounter (Addendum)
Patient made aware of lab results and Marrianne Mood, PA recommendations.  Elevated glucose fwd to pcp to address.  Patient confirms that she is taking torsemide 20 mg daily and potassium 20 meq daily. She takes her Metolazone 2.5 mg twice a week and an additional 20 meq of potassium on those days.  Her last dose of metolazone was taken on Tues 06/29/20. She would normally take her next dose over the weekend.  Patient instructed to HOLD torsemide, metolazone, potassium x 2 days Sat and Sun. [facility]. Adv her to resume her torsemide 20 mg daily and her potassium 20 meq daily on Mon 6/6 and to HOLD her metolazone until her repeat labs are reviewed and further instruction is given.  Patient verbalized understanding with read backand voiced appreciation for the call.

## 2020-07-02 NOTE — Telephone Encounter (Signed)
Faxed 06/25/20. TNP

## 2020-07-05 ENCOUNTER — Telehealth: Payer: Self-pay | Admitting: Cardiovascular Disease

## 2020-07-05 ENCOUNTER — Telehealth: Payer: Self-pay

## 2020-07-05 ENCOUNTER — Other Ambulatory Visit
Admission: RE | Admit: 2020-07-05 | Discharge: 2020-07-05 | Disposition: A | Discharge status: Home / Self Care | Payer: Medicare Other | Attending: Physician Assistant | Admitting: Physician Assistant

## 2020-07-05 DIAGNOSIS — I5032 Chronic diastolic (congestive) heart failure: Secondary | ICD-10-CM | POA: Insufficient documentation

## 2020-07-05 LAB — BASIC METABOLIC PANEL
Anion gap: 10 (ref 5–15)
BUN: 37 mg/dL — ABNORMAL HIGH (ref 8–23)
CO2: 25 mmol/L (ref 22–32)
Calcium: 8.7 mg/dL — ABNORMAL LOW (ref 8.9–10.3)
Chloride: 101 mmol/L (ref 98–111)
Creatinine, Ser: 1.24 mg/dL — ABNORMAL HIGH (ref 0.44–1.00)
GFR, Estimated: 43 mL/min — ABNORMAL LOW (ref >60)
Glucose, Bld: 232 mg/dL — ABNORMAL HIGH (ref 70–99)
Potassium: 4.3 mmol/L (ref 3.5–5.1)
Sodium: 136 mmol/L (ref 135–145)

## 2020-07-05 LAB — BRAIN NATRIURETIC PEPTIDE: B Natriuretic Peptide: 202.1 pg/mL — ABNORMAL HIGH (ref 0.0–100.0)

## 2020-07-05 NOTE — Telephone Encounter (Signed)
To Marrianne Mood, PA to review the patient's repeat lab work and notes regarding anticoagulation.

## 2020-07-05 NOTE — Telephone Encounter (Signed)
Patient called in to let me know that cardiology is running lab work and now is repeating lab work for an abnormal value.  I told her that once she gets that worked out and finds out what the results are to ask for their recommendation re; stopping Warfarin x 5 days.  Patient verbalizes u/o information.  She does not feel comfortable going off of her Warfarin with out having something else on board for anti-coagulation.

## 2020-07-05 NOTE — Telephone Encounter (Signed)
Please call with lab results 

## 2020-07-05 NOTE — Telephone Encounter (Signed)
Spoke to pt, reviewed preliminary results of labs completed today.  Notified pt we will contact her after provider has reviewed results and given recommendation. Pt verbalized understanding.

## 2020-07-05 NOTE — Telephone Encounter (Signed)
Copied from [place]. Topic: General - Other >> Jul 05, 2020  3:38 PM Alanda Slim E wrote: Reason for CRM: Pt called and stated she received a call from [place] advising her that she was approved for services and that she should call her PCP for the information/Pt would like to know what services and if the office knows anything about this / please advise

## 2020-07-06 ENCOUNTER — Other Ambulatory Visit: Payer: Self-pay | Admitting: Family Medicine

## 2020-07-06 ENCOUNTER — Telehealth: Payer: Self-pay | Admitting: Physician Assistant

## 2020-07-06 NOTE — Telephone Encounter (Signed)
Notes to clinic:  this refill cannot be delegated  Medication filled by a historical provider  Review for refills   Requested Prescriptions  Pending Prescriptions Disp Refills   warfarin (COUMADIN) 3 MG tablet [Pharmacy Med Name: WARFARIN SODIUM 3 MG TABLET] 90 tablet 4    Sig: TAKE 1 TABLET BY [place]      Hematology:  Anticoagulants - warfarin Failed - 07/06/2020 12:46 PM      Failed - This refill cannot be delegated      Failed - If the patient is managed by Coumadin Clinic - route to their Pool. If not, forward to the provider.      Passed - INR in normal range and within 30 days    INR  Date Value Ref Range Status  06/23/2020 2.8 2.0 - 3.0 Final  01/09/2020 1.4 (H) 0.8 - 1.2 Final    Comment:    (NOTE) INR goal varies based on device and disease states. Performed at [facility], [address] [place], [place] [address]   01/26/2014 3.5  Final    Comment:    INR reference interval applies to patients on anticoagulant therapy. A single INR therapeutic range for coumarins is not optimal for all indications; however, the suggested range for most indications is 2.0 - 3.0. Exceptions to the INR Reference Range may include: Prosthetic heart valves, acute myocardial infarction, prevention of myocardial infarction, and combinations of aspirin and anticoagulant. The need for a higher or lower target INR must be assessed individually. Reference: The Pharmacology and Management of the Vitamin K  antagonists: the seventh ACCP Conference on Antithrombotic and Thrombolytic Therapy. ZCHYI.5027 Sept:126 (3suppl): [id number]. A HCT value >55% may artifactually increase the PT.  In one study,  the increase was an average of 25%. Reference:  "Effect on Routine and Special Coagulation Testing Values of Citrate Anticoagulant Adjustment in Patients with High HCT Values." American Journal of Clinical Pathology 2006;126:400-405.           Passed - Valid encounter  within last 3 months    Recent Outpatient Visits           1 month ago Urinary frequency   [place] [facility], Laurita Quint, FNP   3 months ago Urinary tract infection with hematuria, site unspecified   [facility] Birdie Sons, MD   4 months ago [place] and fatigue   [employer], Vickki Muff, [place]   4 months ago Nausea   [facility] Jerrol Banana., MD   5 months ago Cellulitis of right lower leg   [facility] Jerrol Banana., MD       Future Appointments             In 1 month Jerrol Banana., MD [facility], [facility]   In 1 month [name], Kathlene November, MD [facility], [facility]   In 2 months [name], Kathlene November, MD [place], [facility]              Signed Prescriptions Disp Refills   ezetimibe (ZETIA) 10 MG tablet 90 tablet 2    Sig: TAKE 1 TABLET BY MOUTH EVERY DAY      Cardiovascular:  Antilipid - Sterol Transport Inhibitors Failed - 07/06/2020 12:46 PM      Failed - Total Cholesterol in normal range and within 360 days    Cholesterol, Total  Date Value Ref Range Status  01/17/2019  172 100 - 199 mg/dL Final          Failed - LDL in normal range and within 360 days    LDL Chol Calc (NIH)  Date Value Ref Range Status  01/17/2019 94 0 - 99 mg/dL Final          Failed - HDL in normal range and within 360 days    HDL  Date Value Ref Range Status  01/17/2019 57 >39 mg/dL Final          Failed - Triglycerides in normal range and within 360 days    Triglycerides  Date Value Ref Range Status  01/17/2019 119 0 - 149 mg/dL Final          Passed - Valid encounter within last 12 months    Recent Outpatient Visits           1 month ago Urinary frequency   [place] Just, Laurita Quint, FNP   3 months ago Urinary tract infection with hematuria, site unspecified   [facility] Birdie Sons,  MD   4 months ago [place] and fatigue   [place], [place]   4 months ago Nausea   [facility] Jerrol Banana., MD   5 months ago Cellulitis of right lower leg   [facility] Jerrol Banana., MD       Future Appointments             In 1 month Jerrol Banana., MD [facility], [place]   In 1 month [place], MD [facility], [facility]   In 2 months Zena, Kathlene November, MD [facility], [place]

## 2020-07-06 NOTE — Telephone Encounter (Signed)
Reached back out to Tracy Mann, advised still do not have final report from lads reviewed from provider. Will call back once results are in. Tracy Mann verbalized understanding.

## 2020-07-06 NOTE — Telephone Encounter (Signed)
Please see result note.  She should see her PCP +/- endocrinology.  Of note: No medication changes were made at our clinic visit (we just fixed an error in the medication list). The pain clinic had updated her medication list incorrectly to state she was taking lasix (she was not taking lasix and intolerant to lasix as noted in the past). We updated the list to correct the mistake and list her correct medication (torsemide). Renal function checked as part of monitoring and bumped from December- suspect in the setting of poor glucose control given repeat labs after trial of holding her diuretics.   Will Cc primary cardiologist as Juluis Rainier

## 2020-07-06 NOTE — Telephone Encounter (Signed)
Patient calling to check on status of results °

## 2020-07-06 NOTE — Telephone Encounter (Signed)
Documentation to clarify that this patient did not have any changes to her diuretic regimen at our office visit on 06/30/20. Rather, her medication list had been updated by another provider and her diuretic improperly listed as lasix instead of torsemide. She does not take lasix (this was an incorrect change to her medication list by a different office), so we just corrected this medication on her list - the dose was not adjusted. No diuretic changes were made. She should be taking the same diuretic regimen as when seen in office by Dr. Rockey Situ 05/13/2020.

## 2020-07-06 NOTE — Telephone Encounter (Signed)
Advised patient that we did not authorize anything from this company. Most likely a scam.  Patient also says that she needs [place] to Rx Heparin for a spinal procedure that she is having done. She says that her cardiologist told her that we would have to send this in. Please advise. Thanks!

## 2020-07-07 NOTE — Telephone Encounter (Signed)
Attempted to reach pt via home and mobile phone, unable to make contact, LMTCB for lab results.

## 2020-07-07 NOTE — Telephone Encounter (Signed)
Was able to reach back out to [name], regarding her recent lab work, [place], PA-C had a chance to review her results and advised   She should see her PCP +/- endocrinology.  Renal function relatively unchanged despite holding diuretic.  BNP increased with holding diuretic and sodium decreased.   She should resume her diuretics and take as previously directed (we did not make any medication changes at my office visit, so she will just resume her normal doses she has been taking).  She should make sure to get her echo.   Suspect this may be her new baseline renal function.   Glucose is elevated - follow-up with PCP or endocrinology.  Previous TSH was abnormal and calcium is low - follow up with PCP or endocrinology recommended.  Recommend she touch base with her PCP to ensure glycemic control, thyroid control, and recheck her after her UTI.   Did she want to switch to our office to monitor her Coumadin?  I would recommend this, given her recent renal function, and to ensure INR remains therapeutic.   Advised on earlier phone note from 6/3  L[facility]. Adv her to resume her torsemide 20 mg daily and her potassium 20 meq daily on Mon 6/6 and to HOLD her metolazone until her repeat labs are reviewed and further instruction is given.  [name], she had not restarted it yet and will call her PCP, [name] and her endocrinologist   Has a [place] 6/14 and an echo 7/15, she plans to keep both appointments in hopes to find answer with her increase SOB on exertion. Advised we will call after testing to let her know of results and further recommendations with medication or testing. Also has f/u appt with [name] in July as well.    At this time will continue her coumadin with family medicine. Can discuss further at f/u appt to see what [name] recommends.   All questions or  concerns were address and no additional concerns at this time. Agreeable to plan, will call back for anything further.

## 2020-07-07 NOTE — Telephone Encounter (Signed)
Patient returning call.

## 2020-07-07 NOTE — Telephone Encounter (Signed)
Pt called back to report that she is not completely out she just wanted to request more so that she does not run out. She is also requesting the Heparin Rx as well, please advise.

## 2020-07-09 DIAGNOSIS — E039 Hypothyroidism, unspecified: Secondary | ICD-10-CM | POA: Diagnosis not present

## 2020-07-09 DIAGNOSIS — E1165 Type 2 diabetes mellitus with hyperglycemia: Secondary | ICD-10-CM | POA: Diagnosis not present

## 2020-07-12 ENCOUNTER — Telehealth: Payer: Self-pay | Admitting: Physician Assistant

## 2020-07-12 DIAGNOSIS — E1169 Type 2 diabetes mellitus with other specified complication: Secondary | ICD-10-CM | POA: Diagnosis not present

## 2020-07-12 DIAGNOSIS — E039 Hypothyroidism, unspecified: Secondary | ICD-10-CM | POA: Diagnosis not present

## 2020-07-12 DIAGNOSIS — Z794 Long term (current) use of insulin: Secondary | ICD-10-CM | POA: Diagnosis not present

## 2020-07-12 DIAGNOSIS — E1159 Type 2 diabetes mellitus with other circulatory complications: Secondary | ICD-10-CM | POA: Diagnosis not present

## 2020-07-12 DIAGNOSIS — N1831 Chronic kidney disease, stage 3a: Secondary | ICD-10-CM | POA: Diagnosis not present

## 2020-07-12 DIAGNOSIS — E1122 Type 2 diabetes mellitus with diabetic chronic kidney disease: Secondary | ICD-10-CM | POA: Diagnosis not present

## 2020-07-12 DIAGNOSIS — R809 Proteinuria, unspecified: Secondary | ICD-10-CM | POA: Diagnosis not present

## 2020-07-12 DIAGNOSIS — E1129 Type 2 diabetes mellitus with other diabetic kidney complication: Secondary | ICD-10-CM | POA: Diagnosis not present

## 2020-07-12 NOTE — Telephone Encounter (Signed)
Spoke to pt. C/o worsening shortness of breath over weekend and palpitations.  Pt unable to check BP (machine needs batteries).  Per Pulse Ox HR fluctuating b/w 74-100.  SaO2 92%. (Sa02 90% at last ov 6/1). Dyspnea with exertion. Pt feels stable now but "felt like I was going to pass out last night."   Last ov w/ Elenor Quinones, PA 06/30/20 for incr shortness of breath.  Per note: Continue as needed beta-blocker for palpitations. Beta blocker not currently on med list but pt confirms she takes Metoprolol Tartrate 25mg  PRN for palpitations.  This is confirmed by historical med list. Updated med list.  Pt also takes Torsemide 20mg  QD with additional 1 torsemide for weight gain over 3 pounds in 1 night or 5 pounds in 1 week. Continue current metolazone twice per week.  Pt denies weight gain. Has not had to take PRN Torsemide.  Also has not take AM Torsemide today b/c she has her Endocrinology appt this afternoon and did not want to take before she drove.    Advised pt to take her PRN Metoprolol Tartrate 25mg  now and to take her Torsemide when she is able.  Advised pt to go to the ER if oxygen reads below 90% with shortness of breath and/or s/s worsen.  Pt states she wants to try to make it to her appt this afternoon but will go to the ER if s/s worsen or becomes unstable.  Pt voiced understanding.

## 2020-07-12 NOTE — Telephone Encounter (Signed)
.  STAT if HR is under 50 or over 120 (normal HR is 60-100 beats per minute)  What is your heart rate? 100 with exertion   Do you have a log of your heart rate readings (document readings)? Normal sitting 60's -70's   Do you have any other symptoms? Sob worse with exertion    Patient has some metoprolol tartrate at home and wants to know if she should take it today because she has to leave home for another doctors office.

## 2020-07-13 ENCOUNTER — Other Ambulatory Visit: Payer: Self-pay

## 2020-07-13 ENCOUNTER — Encounter
Admission: RE | Admit: 2020-07-13 | Discharge: 2020-07-13 | Disposition: A | Discharge status: Home / Self Care | Payer: Medicare Other | Source: Ambulatory Visit | Attending: Physician Assistant | Admitting: Physician Assistant

## 2020-07-13 DIAGNOSIS — R0602 Shortness of breath: Secondary | ICD-10-CM | POA: Diagnosis not present

## 2020-07-13 DIAGNOSIS — I208 Other forms of angina pectoris: Secondary | ICD-10-CM | POA: Insufficient documentation

## 2020-07-13 DIAGNOSIS — I209 Angina pectoris, unspecified: Secondary | ICD-10-CM | POA: Diagnosis not present

## 2020-07-13 LAB — NM MYOCAR MULTI W/SPECT W/WALL MOTION / EF
LV dias vol: 117 mL (ref 46–106)
LV sys vol: 64 mL
Peak HR: 95 {beats}/min
Percent HR: 70 %
Rest HR: 89 {beats}/min
SDS: 2
SRS: 20
SSS: 11
TID: 0.93

## 2020-07-13 MED ORDER — TECHNETIUM TC 99M TETROFOSMIN IV KIT
10.0000 | PACK | Freq: Once | INTRAVENOUS | Status: AC | PRN
  Administered 2020-07-13: 10.66 via INTRAVENOUS

## 2020-07-13 MED ORDER — REGADENOSON 0.4 MG/5ML IV SOLN
0.4000 mg | Freq: Once | INTRAVENOUS | Status: AC
  Administered 2020-07-13: 0.4 mg via INTRAVENOUS

## 2020-07-13 MED ORDER — TECHNETIUM TC 99M TETROFOSMIN IV KIT
32.6700 | PACK | Freq: Once | INTRAVENOUS | Status: AC | PRN
  Administered 2020-07-13: 32.67 via INTRAVENOUS

## 2020-07-14 ENCOUNTER — Telehealth: Payer: Self-pay | Admitting: Cardiovascular Disease

## 2020-07-14 DIAGNOSIS — R0602 Shortness of breath: Secondary | ICD-10-CM

## 2020-07-14 NOTE — Telephone Encounter (Signed)
Patient calling wanting stress test results

## 2020-07-14 NOTE — Telephone Encounter (Signed)
Stress test ordered by Marrianne Mood, PA.  This was performed yesterday, but has not been signed by [place] yet. I called the patient and notified her that we are waiting on Jacquelyn to sign off, but the patient was requesting the preliminary reading.  I have advised her of Dr. Donivan Scull preliminary findings. The patient did ask specifically about her EF. I advised her that this was read as ~39% by myoview, but advised her this is not the most accurate test to assess this. The patient is currently scheduled for an echo to be done on 08/13/20.  The patient is aware we will call her back with her final results once Jacquelyn signs off on these.

## 2020-07-15 NOTE — Telephone Encounter (Signed)
Arvil Chaco, PA-C  07/14/2020 10:53 PM EDT      Stress test without evidence of significant blockages in the flow of blood to any part of the heart. An area of the heart was not moving as well as it should, consistent with her prior MI in 2007. Another area of her heart was not moving as well as it should and likely correlates with her known pulmonary hypertension. Overall heart squeeze or pump function was reduced from that of normal at 35%, though her 2020 echo EF was very similar at 40-45%. Overall, this was ruled a low risk scan, given no areas of active ischemia; however, still recommend that we correlate these results with that of an echo and thatshe monitor her sx moving forward.

## 2020-07-15 NOTE — Telephone Encounter (Signed)
I spoke with the patient today regarding her myoview results. She does confirm compliance with her torsemide, but has not been weighing daily. She will do this going forward. Her echo appointment has been moved up to tomorrow.  See 07/14/20 phone note.

## 2020-07-15 NOTE — Telephone Encounter (Signed)
Arvil Chaco, PA-C  07/14/2020 10:54 PM EDT      Dragon error to previous result note: 35% should be 39% Corrected result note as below: Stress test without evidence of significant blockages in the flow of blood to any part of the heart. An area of the heart was not moving as well as it should, consistent with her prior MI in 2007. Another area of her heart was not moving as well as it should and likely correlates with her known pulmonary hypertension. Overall heart squeeze or pump function was reduced from that of normal at 39%, though her 2020 echo EF was very similar at 40-45%. Overall, this was ruled a low risk scan, given no areas of active ischemia; however, still recommend that we correlate these results with that of an echo and thatshe monitor her sx moving forward.

## 2020-07-15 NOTE — Telephone Encounter (Signed)
I called and spoke with the patient regarding her myoview results. She is aware she will need the echo to confirm her EF. She is very concerned about her decline in how she is feeling, although this has been progressive over the last few months.  Her echo was scheduled for July, but in reviewing the echo schedule for the office, there has been a cancellation for tomorrow at 2:00 pm.  She is agreeable with coming tomorrow to have this done. She is aware further decisions can be made based off the echo findings.  The patient is also on warfarin and a CBC has not been checked on her recently. She is agreeable with having this done tomorrow while in clinic.

## 2020-07-16 ENCOUNTER — Other Ambulatory Visit: Payer: Medicare Other

## 2020-07-16 ENCOUNTER — Ambulatory Visit (INDEPENDENT_AMBULATORY_CARE_PROVIDER_SITE_OTHER): Payer: Medicare Other

## 2020-07-16 ENCOUNTER — Other Ambulatory Visit: Payer: Self-pay

## 2020-07-16 DIAGNOSIS — R0602 Shortness of breath: Secondary | ICD-10-CM | POA: Diagnosis not present

## 2020-07-16 DIAGNOSIS — I208 Other forms of angina pectoris: Secondary | ICD-10-CM | POA: Diagnosis not present

## 2020-07-16 DIAGNOSIS — I209 Angina pectoris, unspecified: Secondary | ICD-10-CM | POA: Diagnosis not present

## 2020-07-16 MED ORDER — PERFLUTREN LIPID MICROSPHERE
1.0000 mL | INTRAVENOUS | Status: AC | PRN
  Administered 2020-07-16: 2 mL via INTRAVENOUS

## 2020-07-17 LAB — CBC WITH DIFFERENTIAL/PLATELET
Basophils Absolute: 0.1 10*3/uL (ref 0.0–0.2)
Basos: 1 %
EOS (ABSOLUTE): 0.6 10*3/uL — ABNORMAL HIGH (ref 0.0–0.4)
Eos: 7 %
Hematocrit: 36.9 % (ref 34.0–46.6)
Hemoglobin: 12.3 g/dL (ref 11.1–15.9)
Immature Grans (Abs): 0.1 10*3/uL (ref 0.0–0.1)
Immature Granulocytes: 1 %
Lymphocytes Absolute: 1.7 10*3/uL (ref 0.7–3.1)
Lymphs: 18 %
MCH: 29.6 pg (ref 26.6–33.0)
MCHC: 33.3 g/dL (ref 31.5–35.7)
MCV: 89 fL (ref 79–97)
Monocytes Absolute: 0.8 10*3/uL (ref 0.1–0.9)
Monocytes: 9 %
Neutrophils Absolute: 6.2 10*3/uL (ref 1.4–7.0)
Neutrophils: 64 %
Platelets: 320 10*3/uL (ref 150–450)
RBC: 4.16 x10E6/uL (ref 3.77–5.28)
RDW: 12.3 % (ref 11.7–15.4)
WBC: 9.4 10*3/uL (ref 3.4–10.8)

## 2020-07-17 LAB — ECHOCARDIOGRAM COMPLETE
Area-P 1/2: 5.34 cm2
S' Lateral: 4.7 cm

## 2020-07-19 ENCOUNTER — Telehealth: Payer: Self-pay | Admitting: Cardiovascular Disease

## 2020-07-19 NOTE — Telephone Encounter (Signed)
To [place], PA to review echo from 04/15/20.  Per preliminary report, her EF is down to 30-35%. Previous echo from 02/15/18 showed her EF was 40-45 % at that time.  Will forward to provider prior to calling patient as to have recommendations available at that time.

## 2020-07-19 NOTE — Telephone Encounter (Signed)
Patient waiting on echo results

## 2020-07-20 ENCOUNTER — Ambulatory Visit: Payer: Self-pay | Admitting: *Deleted

## 2020-07-20 NOTE — Telephone Encounter (Signed)
FYI

## 2020-07-20 NOTE — Telephone Encounter (Signed)
Summary: fever and body aches   Pt called to cancel her coumadin appt for tomorrow / Pt stated she is running a fever of 100 and feels aches all over/ pt stated she took a covid test and was negative / please advise and follow up with pt      Reason for Disposition  Patient sounds very sick or weak to the triager  Answer Assessment - Initial Assessment Questions 1. COVID-19 DIAGNOSIS: "Who made your COVID-19 diagnosis?" "Was it confirmed by a positive lab test or self-test?" If not diagnosed by a doctor (or NP/PA), ask "Are there lots of cases (community spread) where you live?" Note: See public health department website, if unsure.     No diagnosis- home test on Monday- negative- patient will retest 2. COVID-19 EXPOSURE: "Was there any known exposure to COVID before the symptoms began?" CDC Definition of close contact: within 6 feet (2 meters) for a total of 15 minutes or more over a 24-hour period.      No known 3. ONSET: "When did the COVID-19 symptoms start?"      Saturday  4. WORST SYMPTOM: "What is your worst symptom?" (e.g., cough, fever, shortness of breath, muscle aches)     weakness 5. COUGH: "Do you have a cough?" If Yes, ask: "How bad is the cough?"       Slight cough 6. FEVER: "Do you have a fever?" If Yes, ask: "What is your temperature, how was it measured, and when did it start?"     Last night-101- using tylenol 7. RESPIRATORY STATUS: "Describe your breathing?" (e.g., shortness of breath, wheezing, unable to speak)      SOB- heart disease 8. BETTER-SAME-WORSE: "Are you getting better, staying the same or getting worse compared to yesterday?"  If getting worse, ask, "In what way?"     Worse- patient states her heart is weak and she is having trouble doing normal activity 9. HIGH RISK DISEASE: "Do you have any chronic medical problems?" (e.g., asthma, heart or lung disease, weak immune system, obesity, etc.)     Yes- age, heart disease 10. VACCINE: "Have you had the COVID-19  vaccine?" If Yes, ask: "Which one, how many shots, when did you get it?"       Not asked 11. BOOSTER: "Have you received your COVID-19 booster?" If Yes, ask: "Which one and when did you get it?"       Yes- Pfizer  12. PREGNANCY: "Is there any chance you are pregnant?" "When was your last menstrual period?"       N/a 13. OTHER SYMPTOMS: "Do you have any other symptoms?"  (e.g., chills, fatigue, headache, loss of smell or taste, muscle pain, sore throat)       Fatigue, weakness, nausea, fever 14. O2 SATURATION MONITOR:  "Do you use an oxygen saturation monitor (pulse oximeter) at home?" If Yes, ask "What is your reading (oxygen level) today?" "What is your usual oxygen saturation reading?" (e.g., 95%)       N/a  Protocols used: Coronavirus (COVID-19) Diagnosed or Suspected-A-AH

## 2020-07-20 NOTE — Telephone Encounter (Signed)
Spoke with pt. Preliminary results available and discussed.  Pt aware that Dr. Rockey Situ will review echo for final result and recc.   See also phone note 07/12/20.  Pt continues to express frustration with "not having an answer to my concerns."  Pt does confirm compliance with diuresis. Wt today up 1lb.  Continued shortness of breath. Pt unable to provide VS.  Pt continues to states " I just feel my heart getting weaker and I am so concerned."   Moved pt's appt up sooner from 7/13 to 6/29 with Dr. Rockey Situ to assess change in s/s and review echo results.  Confirmed appt 07/28/20 @ 2:20 PM.  Advised pt if s/s worsen or becomes unstable to go to the Emergency Room in the interim.  Pt appreciative and voiced understanding.

## 2020-07-20 NOTE — Telephone Encounter (Signed)
Patient was surprised to get call from PCP office- she is upset because she has not heard from cardiology about her most recent test. Advised patient I was calling to discuss her symptoms: fever, weakness, aches. Patient states she started feeling bad Saturday and has been simply trying to maintain. She did take COVID test Monday- reports negative. Advised her to do another. Patient states she has fever- she is taking Tylenol for, she feels very weak-SOB which she blames on her heart and is awaiting results and plan from cardiologist. Patient states she is unable to eat correct diet because she is nauseous and does not feel good. She has gained 1 lb and believes she is retaining fluid. She states she is going to take a fluid pill to see if that helps her with her SOB. Advised ED for evaluation due to weakness and inability to do normal activity. Patient states she will go if the fluid pill does not help- thanked the office for checking on her. Advised patient to please go to ED.

## 2020-07-20 NOTE — Telephone Encounter (Signed)
Patient calling for echo results asap she is concerned with the delay and is not sure what the POC will be.    Patient states someone should have called her already with some sort of update and that Dr. Rockey Situ is usually on top of things.   Patient asking for a call to discuss concerns today even if results are not complete.

## 2020-07-21 ENCOUNTER — Ambulatory Visit: Payer: Self-pay

## 2020-07-25 ENCOUNTER — Telehealth: Payer: Self-pay | Admitting: Physician Assistant

## 2020-07-25 NOTE — Telephone Encounter (Signed)
Call placed to pt to review echo, given she expressed concern regarding the results on the same day as the echo was performed, as well as since that time. Unable to reach via cell or home numbers.

## 2020-07-25 NOTE — Telephone Encounter (Addendum)
Unable to reach today to discuss echo, as I suspect she will feel better if results are reviewed before her upcoming visit. Will try again later today.  Addendum: Additional attempts made to reach pt. Unable to reach at this time.

## 2020-07-28 ENCOUNTER — Encounter: Payer: Self-pay | Admitting: Cardiovascular Disease

## 2020-07-28 ENCOUNTER — Ambulatory Visit: Payer: Medicare Other | Admitting: Cardiovascular Disease

## 2020-07-28 ENCOUNTER — Other Ambulatory Visit: Payer: Self-pay

## 2020-07-28 ENCOUNTER — Ambulatory Visit: Payer: Self-pay

## 2020-07-28 VITALS — BP 120/54 | HR 95 | Ht 65.5 in | Wt 219.2 lb

## 2020-07-28 DIAGNOSIS — I482 Chronic atrial fibrillation, unspecified: Secondary | ICD-10-CM | POA: Diagnosis not present

## 2020-07-28 DIAGNOSIS — I10 Essential (primary) hypertension: Secondary | ICD-10-CM

## 2020-07-28 DIAGNOSIS — I5042 Chronic combined systolic (congestive) and diastolic (congestive) heart failure: Secondary | ICD-10-CM | POA: Diagnosis not present

## 2020-07-28 DIAGNOSIS — R002 Palpitations: Secondary | ICD-10-CM | POA: Diagnosis not present

## 2020-07-28 DIAGNOSIS — E785 Hyperlipidemia, unspecified: Secondary | ICD-10-CM

## 2020-07-28 DIAGNOSIS — I25118 Atherosclerotic heart disease of native coronary artery with other forms of angina pectoris: Secondary | ICD-10-CM

## 2020-07-28 MED ORDER — TORSEMIDE 20 MG PO TABS: 20.0000 mg | ORAL_TABLET | ORAL | 3 refills | Status: DC

## 2020-07-28 MED ORDER — METOLAZONE 2.5 MG PO TABS: 2.5000 mg | ORAL_TABLET | ORAL | 3 refills | Status: DC

## 2020-07-28 NOTE — Patient Instructions (Addendum)
BMP in 2 weeks (Around July 13 th)  Go to [place] at [facility] then go to 1st desk on the right to check in, past the screening table  Lab hours: Monday- Friday (7:30 am- 5:30 pm)    Medication Instructions:   For weight >214: Take metolazone 2.5 mg , 30 min later take torsemide 40 mg  For weight 211 to 214: take torsemide 40 mg daily For weight <211, take torsemide 20 mg daily  Stop lisinopril  After 2 days off lisinopril start Entresto 24/26 mg twice a day Start carvedilol 3.125 twice daily  If you need a refill on your cardiac medications before your next appointment, please call your pharmacy.    Lab work: BMP in 2 weeks   If you have labs (blood work) drawn today and your tests are completely normal, you will receive your results only by: [place] (if you have MyChart) OR A paper copy in the mail If you have any lab test that is abnormal or we need to change your treatment, we will call you to review the results.   Testing/Procedures: No new testing needed   Follow-Up: At [facility], you and your health needs are our priority.  As part of our continuing mission to provide you with exceptional heart care, we have created designated Provider Care Teams.  These Care Teams include your primary Cardiologist (physician) and Advanced Practice Providers (APPs -  Physician Assistants and Nurse Practitioners) who all work together to provide you with the care you need, when you need it.  You will need a follow up appointment in 2 month, APP ok  Providers on your designated Care Team:   Murray Hodgkins, NP Christell Faith, PA-C Marrianne Mood, PA-C  Any Other Special Instructions Will Be Listed Below (If Applicable).  COVID-19 Vaccine Information can be found at: [email or web address].uk For questions related to vaccine distribution or appointments, please email [email or web address] or call [phone].

## 2020-07-28 NOTE — Telephone Encounter (Signed)
Patient coming in today to see Dr. Rockey Situ for review of results

## 2020-07-28 NOTE — Progress Notes (Signed)
Cardiology Office Note  Date:  07/28/2020   ID:  Tracy Mann, Tracy Mann 06-06-1933, MRN [id number]  PCP:  Jerrol Banana., MD   Chief Complaint  Patient presents with   Shortness of Breath    Patient c/o shortness of breath, nausea, chest pain, swelling in ankles and weakness. Medications reviewed by the patient verbally.     HPI:  Tracy Mann is a 87 year old woman with a PMH significant for  chronic atrial fibrillation on Coumadin therapy,  diastolic dysfunction, EF 40 to 45% on echo 01/2018  hypertension,  hyperlipidemia,  morbid obesity,   moderate pulmonary hypertension inferior wall myocardial infarction felt to be secondary to a thrombus from atrial fib in October 2007 at [facility],   episodes of chest pain  who presents for followup Of her coronary artery disease and leg edema.  Last seen by myself in clinic February 2022 Seen by one of our providers June 30, 2020 Prior history torsemide noncompliance Recent COVID infection  In follow-up today reports that she is struggling to walk, secondary to giving out, some shortness of breath, fullness in her legs and chest Weight at home: 215 Weight in the office 219 pounds, BMI 36, height 5 foot 5 Sedentary, family helping her do everything  Echocardiogram July 16, 2020 reviewed in detail with her  1. Left ventricular ejection fraction, by estimation, is 30 to 35%. The  left ventricle has moderately decreased function. The left ventricle  demonstrates global hypokinesis. The left ventricular internal cavity size  was moderately dilated. Left  ventricular diastolic parameters are indeterminate.   2. Right ventricular systolic function is mildly reduced. The right  ventricular size is mildly enlarged. There is moderately elevated  pulmonary artery systolic pressure. The estimated right ventricular  systolic pressure is 41.2 mmHg.   3. Left atrial size was moderately dilated.   4. The mitral valve is normal  in structure. Moderate mitral valve  regurgitation.   5. The inferior vena cava is dilated in size with <50% respiratory  variability, suggesting right atrial pressure of 15 mmHg.   Lab work reviewed A1c 6.8 Creatinine 1.0 BUN 29  Does not use her lymphedema compression pumps  HBA1C 9.1 on labs 12/2018 HBA1C 8.3 on 05/20/2019  EKG personally reviewed by myself on todays visit Shows atrial fibrillation ventricular rate 96 bpm intraventricular conduction delay  Other past medical history reviewed In follow-up today, she reports that she had an episode of chest pain 05/21/2015 on the left side She went to the emergency room, was admitted. Hospital records reviewed. Cardiac enzymes were unrevealing, EKG unchanged. She had stress Myoview showing old inferior MI with no ischemia consistent with her known anatomy. She did not take nitroglycerin for her symptoms.    PMH:   has a past medical history of Acute myocardial infarction of inferior wall ([place]), Arthritis, Chronic anticoagulation, Chronic atrial fibrillation ([place]), Chronic combined systolic and diastolic CHF (congestive heart failure) ([place]), Essential hypertension, Hyperlipidemia, mixed, Mixed Ischemic/Nonischemic Cardiomyopathy, Nonobstructive coronary artery disease, Obesity, Thyroid disease, Type II diabetes mellitus ([place]), Venous insufficiency, and Vitamin D deficiency.  PSH:    Past Surgical History:  Procedure Laterality Date   CARDIAC CATHETERIZATION  11/2012   [facility];EF 45-50%   CHOLECYSTECTOMY  1999   TUBAL LIGATION  1968   VESICOVAGINAL FISTULA CLOSURE W/ TAH  1996    Current Outpatient Medications  Medication Sig Dispense Refill   ACCU-CHEK AVIVA PLUS test strip USE WITH METER TO CHECK GLUCOSE BEFORE  MEALS AND AT BEDTIME 200 strip 12   albuterol (VENTOLIN HFA) 108 (90 Base) MCG/ACT inhaler TAKE 2 PUFFS BY MOUTH EVERY 6 HOURS AS NEEDED FOR WHEEZE OR SHORTNESS OF BREATH 8.5 each 2   estradiol (ESTRACE) 0.1 MG/GM vaginal  cream Place 1 Applicatorful vaginally at bedtime. 42.5 g 0   ezetimibe (ZETIA) 10 MG tablet TAKE 1 TABLET BY MOUTH EVERY DAY 90 tablet 2   flavoxATE (URISPAS) 100 MG tablet Take 1 tablet (100 mg total) by mouth 2 (two) times daily as needed for bladder spasms. 60 tablet 11   fluticasone (FLONASE) 50 MCG/ACT nasal spray SPRAY TWO SPRAYS IN EACH NOSTRIL ONCE DAILY 48 mL 3   [START ON 08/21/2020] HYDROcodone-acetaminophen (NORCO) 10-325 MG tablet Take 1 tablet by mouth every 6 (six) hours as needed for severe pain. Must last 30 days. 105 tablet 0   insulin glargine (LANTUS SOLOSTAR) 100 UNIT/ML Solostar Pen Inject 20 units nightly between 8-10 PM. Adjust as directed. Max dose is 50 units daily.     insulin lispro (HUMALOG KWIKPEN) 100 UNIT/ML KwikPen INJECT 10-20 UNITS THREE TIMES DAILY WITH EACH MEAL. 15 mL 12   Insulin Pen Needle (RELION PEN NEEDLE 31G/8MM) 31G X 8 MM MISC Use with insulin pen three times daily 100 each 12   levothyroxine (SYNTHROID) 100 MCG tablet Take by mouth.     levothyroxine (SYNTHROID) 112 MCG tablet Take 1 tablet (112 mcg total) by mouth daily. 90 tablet 1   lisinopril (ZESTRIL) 2.5 MG tablet TAKE 1 TABLET BY MOUTH EVERYDAY AT BEDTIME 90 tablet 1   LORazepam (ATIVAN) 1 MG tablet TAKE 1 TABLET BY MOUTH 2 TIMES DAILY AS NEEDED. 60 tablet 1   Magnesium 500 MG CAPS Take 1,000 mg by mouth daily.      metolazone (ZAROXOLYN) 2.5 MG tablet Take 1 tablet (2.5 mg total) by mouth 2 (two) times a week. TAKE 30 minutes before Torsemide 26 tablet 1   metoprolol tartrate (LOPRESSOR) 25 MG tablet Take 25 mg by mouth daily as needed (palpitations).     mupirocin ointment (BACTROBAN) 2 % Apply topically daily. 22 g 0   nitroGLYCERIN (NITROSTAT) 0.4 MG SL tablet PLACE 1 TABLET (0.4 MG TOTAL) UNDER THE TONGUE EVERY 5 (FIVE) MINUTES AS NEEDED FOR CHEST PAIN. 25 tablet 1   NONFORMULARY OR COMPOUNDED ITEM Diclofenac/Baclofen/Bu/Gabapentin Compounded Cream     NONFORMULARY OR COMPOUNDED ITEM See  pharmacy note- Not to use on any open skin or wound. 120 each 3   phenazopyridine (PYRIDIUM) 200 MG tablet Take 1 tablet (200 mg total) by mouth 3 (three) times daily as needed for pain. 10 tablet 0   potassium chloride SA (KLOR-CON) 20 MEQ tablet Take 1-2 tablets with metolazone     torsemide (DEMADEX) 20 MG tablet Take 1 tablet (20 mg total) by mouth daily. 90 tablet 3   warfarin (COUMADIN) 3 MG tablet TAKE 1 TABLET BY MOUTH EVERY DAY 90 tablet 4   HYDROcodone-acetaminophen (NORCO) 10-325 MG tablet Take 1 tablet by mouth every 6 (six) hours as needed for severe pain. Must last 30 days. (Patient not taking: Reported on 07/28/2020) 105 tablet 0   HYDROcodone-acetaminophen (NORCO) 10-325 MG tablet Take 1 tablet by mouth every 6 (six) hours as needed for severe pain. Must last 30 days. (Patient not taking: Reported on 07/28/2020) 105 tablet 0   No current facility-administered medications for this visit.    Allergies:   Duloxetine; Other; Paroxetine; Sulfa antibiotics; Cephalexin; Clarithromycin; Diphenhydramine; Estrogens conjugated; Estrogens, conjugated;  Sulfonamide derivatives; and Nitrofurantoin   Social History:  The patient  reports that she has never smoked. She has never used smokeless tobacco. She reports that she does not drink alcohol and does not use drugs.   Family History:   family history includes Heart disease in her mother; Thyroid disease in her father.    Review of Systems: Review of Systems  Constitutional: Negative.   HENT: Negative.    Respiratory:  Positive for shortness of breath.   Cardiovascular:  Positive for leg swelling.  Gastrointestinal: Negative.   Musculoskeletal:  Positive for joint pain.  Neurological: Negative.   Psychiatric/Behavioral: Negative.    All other systems reviewed and are negative.  PHYSICAL EXAM: VS:  BP (!) 120/54 (BP Location: Left Arm, Patient Position: Sitting, Cuff Size: Normal)   Pulse 95   Ht 5' 5.5" (1.664 m)   Wt 219 lb 4 oz  (99.5 kg)   SpO2 94%   BMI 35.93 kg/m  , BMI Body mass index is 35.93 kg/m. Constitutional: obese, oriented to person, place, and time. No distress.  HENT:  Head: Grossly normal Eyes:  no discharge. No scleral icterus.  Neck: No JVD, no carotid bruits  Cardiovascular: Irregularly irregular, no murmurs appreciated 1+ pitting edema b/l Pulmonary/Chest: Clear to auscultation bilaterally, no wheezes or rales Abdominal: Soft.  no distension.  no tenderness.  Musculoskeletal: Normal range of motion Neurological:  normal muscle tone. Coordination normal. No atrophy Skin: Skin warm and dry Psychiatric: normal affect, pleasant  Recent Labs: 11/26/2019: Magnesium 2.1 12/31/2019: TSH <0.005 01/09/2020: ALT 14 07/05/2020: B Natriuretic Peptide 202.1; BUN 37; Creatinine, Ser 1.24; Potassium 4.3; Sodium 136 07/16/2020: Hemoglobin 12.3; Platelets 320    Lipid Panel Lab Results  Component Value Date   CHOL 172 01/17/2019   HDL 57 01/17/2019   LDLCALC 94 01/17/2019   TRIG 119 01/17/2019      Wt Readings from Last 3 Encounters:  07/28/20 219 lb 4 oz (99.5 kg)  06/30/20 220 lb (99.8 kg)  06/16/20 218 lb (98.9 kg)     ASSESSMENT AND PLAN:  Acute on chronic diastolic CHF Mild exacerbation of symptoms with recent COVID Component of medication noncompliance Will miss torsemide here and there, miss her potassium High fluid intake, recently lots of watermelon Drinks high fluids to " flush her kidneys" Some days does not urinate much, on those days drinks more Complaining about shortness of breath, chest fullness, leg swelling, abdominal fullness.  Cannot get around, has to stop to catch her breath Recommended fluid striction's, carbohydrate restrictions, we need to get her weight down as she is relatively immobile Recommend she follow the regimen as detailed below: For weight >214: Take metolazone 2.5 mg , 30 min later take torsemide 40 mg  For weight 211 to 214: take torsemide 40 mg  daily For weight <211, take torsemide 20 mg daily  Cardiomyopathy ejection fraction 45% now down to 35%, IVC dilated, moderately elevated right heart pressures Main complaint is fluid retention, leg swelling Add carvedilol 3.125 twice daily, hold lisinopril 2 days, start Entresto 24/26 mg twice daily  Mixed hyperlipidemia Continue Zetia Statin intolerance, no changes made No; changes  Essential hypertension - Plan: EKG 12-Lead Blood pressure is well controlled on today's visit. No changes made to the medications.  Chronic atrial fibrillation (HCC) - Plan: EKG 12-Lead A. fib likely contributing to CHF On warfarin,  Rate controlled, carvedilol added  Obstructive sleep apnea Previously reported this was "not a major issue" Currently not on CPAP  Type 2 diabetes mellitus without complication, with long-term current use of insulin (HCC) Stressed importance of weight loss, low carbohydrate diet Ideally should be 150 pounds not 220.  Contributing to her heart failure symptoms    Total encounter time more than 35 minutes  Greater than 50% was spent in counseling and coordination of care with the patient   No orders of the defined types were placed in this encounter.    Signed, Esmond Plants, M.D., Ph.D. 07/28/2020  [place], [place]

## 2020-08-11 ENCOUNTER — Encounter: Payer: Self-pay | Admitting: Family Medicine

## 2020-08-11 ENCOUNTER — Other Ambulatory Visit: Payer: Self-pay

## 2020-08-11 ENCOUNTER — Ambulatory Visit (INDEPENDENT_AMBULATORY_CARE_PROVIDER_SITE_OTHER): Payer: Medicare Other | Admitting: Family Medicine

## 2020-08-11 VITALS — BP 110/71 | HR 89 | Temp 98.2°F | Resp 16 | Ht 66.0 in | Wt 215.0 lb

## 2020-08-11 DIAGNOSIS — Z6837 Body mass index (BMI) 37.0-37.9, adult: Secondary | ICD-10-CM

## 2020-08-11 DIAGNOSIS — E1159 Type 2 diabetes mellitus with other circulatory complications: Secondary | ICD-10-CM

## 2020-08-11 DIAGNOSIS — I25118 Atherosclerotic heart disease of native coronary artery with other forms of angina pectoris: Secondary | ICD-10-CM | POA: Diagnosis not present

## 2020-08-11 DIAGNOSIS — F411 Generalized anxiety disorder: Secondary | ICD-10-CM

## 2020-08-11 DIAGNOSIS — Z794 Long term (current) use of insulin: Secondary | ICD-10-CM

## 2020-08-11 DIAGNOSIS — I482 Chronic atrial fibrillation, unspecified: Secondary | ICD-10-CM | POA: Diagnosis not present

## 2020-08-11 DIAGNOSIS — I5042 Chronic combined systolic (congestive) and diastolic (congestive) heart failure: Secondary | ICD-10-CM

## 2020-08-11 DIAGNOSIS — E66812 Obesity, class 2: Secondary | ICD-10-CM

## 2020-08-11 NOTE — Progress Notes (Signed)
I,April Miller,acting as a scribe for Wilhemena Durie, MD.,have documented all relevant documentation on the behalf of Wilhemena Durie, MD,as directed by  Wilhemena Durie, MD while in the presence of Wilhemena Durie, MD.   Established patient visit   Patient: Tracy Mann   DOB: 11-Nov-1933   87 y.o. Female  MRN: [id number] Visit Date: 08/11/2020  Today's healthcare provider: Wilhemena Durie, MD   Chief Complaint  Patient presents with   Follow-up   Subjective    HPI  Comes in for follow-up.  Patient has actually feeling better than she has in some time but is concerned about her diagnosis of heart failure.  She asked about getting a second opinion from a cardiologist.  She sees Dr. Rockey Situ . Follow up for Vaginal dryness  The patient was last seen for this 3 months ago. Changes made at last visit include; seen by [facility]. Patient was given rx for estradiol (ESTRACE) 0.1 MG/GM vaginal cream  She reports good compliance with treatment. She feels that condition is Improved. She is not having side effects. none  ---------------------------------------------------------------------------- Patient states she has had much improvement with estradiol cream.      Medications: Outpatient Medications Prior to Visit  Medication Sig   ACCU-CHEK AVIVA PLUS test strip USE WITH METER TO CHECK GLUCOSE BEFORE MEALS AND AT BEDTIME   albuterol (VENTOLIN HFA) 108 (90 Base) MCG/ACT inhaler TAKE 2 PUFFS BY MOUTH EVERY 6 HOURS AS NEEDED FOR WHEEZE OR SHORTNESS OF BREATH   estradiol (ESTRACE) 0.1 MG/GM vaginal cream Place 1 Applicatorful vaginally at bedtime.   ezetimibe (ZETIA) 10 MG tablet TAKE 1 TABLET BY MOUTH EVERY DAY   flavoxATE (URISPAS) 100 MG tablet Take 1 tablet (100 mg total) by mouth 2 (two) times daily as needed for bladder spasms.   fluticasone (FLONASE) 50 MCG/ACT nasal spray SPRAY TWO SPRAYS IN EACH NOSTRIL ONCE DAILY   [START ON 08/21/2020]  HYDROcodone-acetaminophen (NORCO) 10-325 MG tablet Take 1 tablet by mouth every 6 (six) hours as needed for severe pain. Must last 30 days.   insulin glargine (LANTUS SOLOSTAR) 100 UNIT/ML Solostar Pen Inject 20 units nightly between 8-10 PM. Adjust as directed. Max dose is 50 units daily.   insulin lispro (HUMALOG KWIKPEN) 100 UNIT/ML KwikPen INJECT 10-20 UNITS THREE TIMES DAILY WITH EACH MEAL.   Insulin Pen Needle (RELION PEN NEEDLE 31G/8MM) 31G X 8 MM MISC Use with insulin pen three times daily   levothyroxine (SYNTHROID) 100 MCG tablet Take by mouth.   levothyroxine (SYNTHROID) 112 MCG tablet Take 1 tablet (112 mcg total) by mouth daily.   lisinopril (ZESTRIL) 2.5 MG tablet TAKE 1 TABLET BY MOUTH EVERYDAY AT BEDTIME   LORazepam (ATIVAN) 1 MG tablet TAKE 1 TABLET BY MOUTH 2 TIMES DAILY AS NEEDED.   Magnesium 500 MG CAPS Take 1,000 mg by mouth daily.    metolazone (ZAROXOLYN) 2.5 MG tablet Take 1 tablet (2.5 mg total) by mouth as directed. For weight >214: Take metolazone 2.5 mg , 30 min later take torsemide 40 mg OR For weight 211 to 214: take torsemide 40 mg daily OR For weight <211, take torsemide 20 mg daily   metoprolol tartrate (LOPRESSOR) 25 MG tablet Take 25 mg by mouth daily as needed (palpitations).   mupirocin ointment (BACTROBAN) 2 % Apply topically daily.   nitroGLYCERIN (NITROSTAT) 0.4 MG SL tablet PLACE 1 TABLET (0.4 MG TOTAL) UNDER THE TONGUE EVERY 5 (FIVE) MINUTES AS NEEDED FOR CHEST  PAIN.   NONFORMULARY OR COMPOUNDED ITEM Diclofenac/Baclofen/Bu/Gabapentin Compounded Cream   NONFORMULARY OR COMPOUNDED ITEM See pharmacy note- Not to use on any open skin or wound.   phenazopyridine (PYRIDIUM) 200 MG tablet Take 1 tablet (200 mg total) by mouth 3 (three) times daily as needed for pain.   potassium chloride SA (KLOR-CON) 20 MEQ tablet Take 1-2 tablets with metolazone   torsemide (DEMADEX) 20 MG tablet Take 1-2 tablets (20-40 mg total) by mouth as directed. For weight >214: Take  metolazone 2.5 mg , 30 min later take torsemide 40 mg OR For weight 211 to 214: take torsemide 40 mg daily OR For weight <211, take torsemide 20 mg daily   warfarin (COUMADIN) 3 MG tablet TAKE 1 TABLET BY MOUTH EVERY DAY   HYDROcodone-acetaminophen (NORCO) 10-325 MG tablet Take 1 tablet by mouth every 6 (six) hours as needed for severe pain. Must last 30 days. (Patient not taking: Reported on 07/28/2020)   HYDROcodone-acetaminophen (NORCO) 10-325 MG tablet Take 1 tablet by mouth every 6 (six) hours as needed for severe pain. Must last 30 days. (Patient not taking: No sig reported)   No facility-administered medications prior to visit.    Review of Systems  Constitutional:  Negative for appetite change, chills, fatigue and fever.  Respiratory:  Negative for chest tightness and shortness of breath.   Cardiovascular:  Negative for chest pain and palpitations.  Gastrointestinal:  Negative for abdominal pain, nausea and vomiting.  Neurological:  Negative for dizziness and weakness.       Objective    BP 110/71 (BP Location: Right Arm, Patient Position: Sitting, Cuff Size: Large)   Pulse 89   Temp 98.2 F (36.8 C) (Oral)   Resp 16   Ht 5\' 6"  (1.676 m)   Wt 215 lb (97.5 kg)   SpO2 92%   BMI 34.70 kg/m  BP Readings from Last 3 Encounters:  08/11/20 110/71  07/28/20 (!) 120/54  06/30/20 (!) 110/50   Wt Readings from Last 3 Encounters:  08/11/20 215 lb (97.5 kg)  07/28/20 219 lb 4 oz (99.5 kg)  06/30/20 220 lb (99.8 kg)       Physical Exam Vitals reviewed.  Constitutional:      General: She is not in acute distress.    Appearance: Normal appearance. She is not ill-appearing.  HENT:     Head: Normocephalic.  Cardiovascular:     Rate and Rhythm: Normal rate and regular rhythm.     Pulses: Normal pulses.     Heart sounds: Normal heart sounds. No murmur heard.   No friction rub. No gallop.  Pulmonary:     Effort: Pulmonary effort is normal. No respiratory distress.     Breath  sounds: Normal breath sounds. No stridor. No wheezing, rhonchi or rales.  Abdominal:     General: Bowel sounds are normal.     Palpations: Abdomen is soft.     Tenderness: There is no abdominal tenderness. There is no right CVA tenderness or left CVA tenderness.  Musculoskeletal:     Right lower leg: No edema.     Left lower leg: No edema.  Skin:    General: Skin is warm and dry.  Neurological:     Mental Status: She is alert and oriented to person, place, and time.  Psychiatric:        Mood and Affect: Mood normal.        Behavior: Behavior normal.      No results found for any  visits on 08/11/20.  Assessment & Plan     1. Chronic combined systolic and diastolic congestive heart failure [facility]) Patient has follow-up with Dr. Rockey Situ.  F now about 35%.  Recent hospitalizations and patient is weighing daily.  On lisinopril 2.5 mg daily and metolazone as needed weight gain in addition to torsemide as needed More than 50% 25 minute visit spent in counseling or coordination of care  2. Chronic atrial fibrillation (HCC) On Coumadin  3. Atherosclerosis of native coronary artery of native heart with stable angina pectoris ([place]) Risk factors treated  4. Type 2 diabetes mellitus with other circulatory complication, with long-term current use of insulin ([place]) Followed by endocrinology  5. Anxiety, generalized Chronic issue.  Patient uses lorazepam twice a day regularly.  We will try to cut back on this.  6. Class 2 severe obesity due to excess calories with serious comorbidity and body mass index (BMI) of 37.0 to 37.9 in adult [facility]) Diet and exercise stressed.   No follow-ups on file.      I, Wilhemena Durie, MD, have reviewed all documentation for this visit. The documentation on 08/15/20 for the exam, diagnosis, procedures, and orders are all accurate and complete.    Aissa Lisowski Cranford Mon, MD  [facility] [phone] (phone) [phone] (fax)  [place]

## 2020-08-12 ENCOUNTER — Other Ambulatory Visit (INDEPENDENT_AMBULATORY_CARE_PROVIDER_SITE_OTHER): Payer: Medicare Other

## 2020-08-12 DIAGNOSIS — I5042 Chronic combined systolic (congestive) and diastolic (congestive) heart failure: Secondary | ICD-10-CM | POA: Diagnosis not present

## 2020-08-12 DIAGNOSIS — I25118 Atherosclerotic heart disease of native coronary artery with other forms of angina pectoris: Secondary | ICD-10-CM | POA: Diagnosis not present

## 2020-08-12 DIAGNOSIS — I482 Chronic atrial fibrillation, unspecified: Secondary | ICD-10-CM | POA: Diagnosis not present

## 2020-08-13 ENCOUNTER — Other Ambulatory Visit: Payer: Medicare Other

## 2020-08-13 LAB — BASIC METABOLIC PANEL
BUN/Creatinine Ratio: 22 (ref 12–28)
BUN: 24 mg/dL (ref 8–27)
CO2: 29 mmol/L (ref 20–29)
Calcium: 9.4 mg/dL (ref 8.7–10.3)
Chloride: 100 mmol/L (ref 96–106)
Creatinine, Ser: 1.09 mg/dL — ABNORMAL HIGH (ref 0.57–1.00)
Glucose: 105 mg/dL — ABNORMAL HIGH (ref 65–99)
Potassium: 4.6 mmol/L (ref 3.5–5.2)
Sodium: 142 mmol/L (ref 134–144)
eGFR: 50 mL/min/{1.73_m2} — ABNORMAL LOW (ref >59)

## 2020-08-16 ENCOUNTER — Telehealth: Payer: Self-pay | Admitting: Cardiovascular Disease

## 2020-08-16 ENCOUNTER — Telehealth: Payer: Self-pay

## 2020-08-16 MED ORDER — METOPROLOL TARTRATE 25 MG PO TABS: 25.0000 mg | ORAL_TABLET | Freq: Every day | ORAL | 1 refills | Status: DC | PRN

## 2020-08-16 NOTE — Telephone Encounter (Signed)
Requested Prescriptions   Signed Prescriptions Disp Refills   metoprolol tartrate (LOPRESSOR) 25 MG tablet 30 tablet 1    Sig: Take 1 tablet (25 mg total) by mouth daily as needed (palpitations).    Authorizing Provider: Minna Merritts    Ordering User: Raelene Bott, Shanequia Kendrick L

## 2020-08-16 NOTE — Telephone Encounter (Signed)
*  STAT* If patient is at the pharmacy, call can be transferred to refill team.   1. Which medications need to be refilled? (please list name of each medication and dose if known) metoprolol succinate 25 mg bid  2. Which pharmacy/location (including street and city if local pharmacy) is medication to be sent to?[facility]  3. Do they need a 30 day or 90 day supply? 30 with refills

## 2020-08-16 NOTE — Telephone Encounter (Signed)
Able to reach pt regarding her recent lab work, Dr. Rockey Situ had a chance to review her results and advised   "Stable BMP"  Tracy Mann reports she is thankful for the call of her labs, reports over the weekend and some last week has had "increased palpitations, think my A-fib is up and down". Question her BP and HR, pt reports she not been checking them. Advised on her metoprolol 25 mg PRN for palpitations, reports she has not used them, but will take one this morning. Reports that also more shob, and feelings of "giving out" during spells of palpitations.  Advised on going to the ED for evaluation if no improvement with medications and still have symptoms of shob and extreme tiredness/weakness during spells of A-fib, encourage to check her BP and HR. Pt reports understanding but request office visit.  At this time, providers are book till Aug, pt has already has an appt for August schedule, advised to please don't wait till her visit to be evaluated if her symptoms persist as there could be an adviser cardiac event if she holds off, reporting this is "similar to how I felt 10 years ago with my heart attack". Strongly encourage pt to seek the ED if not feeling better and her symptoms continue.   Pt upset that "it does not seem right that a patient can't get seen when they need too, that they half to wait a month, seems like you would have fire more providers, maybe I need to get two cardiologist so I can be seen by at lest one of them when I have symptoms and they can answer my questions". Apologize to pt for no sooner appt, advise that we have multiple providers in clinic, but slots get filled daily with other patients calling in wanting to be seen, there are occasions when people call to cancel then open slots become available. Explain typically Rockey Situ has slots every 20 mins on his clinic days, but they get filled quickly with the number of calls coming in and that is why some appts are not till further  out, same with seeing one of his office staffs such as the APPs, all including [name] rotate through the hospital as well, so they are not in clinic every day. Pt verbalized understanding.  Also, encourage pt to keep Dr. Rockey Situ as her primary cardiologist, as this clinic encourages the use to utilize only one cardiologist, great to have PCP Dr. Rosanna Randy for other concerns and to review her cardiac reports as well, but to have another cardiologist can get things complicated between providers, pt verbalized understanding, reports "I sorry I am taking this out on  you, I know it is not your fault", gave pt reassurance that is was okay, I understood her frustration.  Tracy Mann question her echo results, advised this was reviewed with her at her last office visit with Dr. Rockey Situ, pt reports, "he did not say anything about it", advised based on [name]'s progress notes "Echocardiogram July 16, 2020 reviewed in detail with her" (07/28/2020). Pt request a copy, advised to seek medical records for any copies of office notes and results. Pt does not have MyChart to review reports. She will seek medical records, "there are other reports I need too, as I been trying to research things so I can understand the medical world".   Otherwise all questions or concerns were address and no additional concerns at this time. Agreeable to plan, will call back for anything further.

## 2020-08-17 ENCOUNTER — Ambulatory Visit: Payer: Medicare Other | Admitting: Cardiovascular Disease

## 2020-08-18 ENCOUNTER — Ambulatory Visit: Payer: Medicare Other

## 2020-08-18 ENCOUNTER — Encounter: Payer: Self-pay | Admitting: Cardiovascular Disease

## 2020-08-18 ENCOUNTER — Other Ambulatory Visit: Payer: Self-pay

## 2020-08-18 ENCOUNTER — Ambulatory Visit: Payer: Medicare Other | Admitting: Cardiovascular Disease

## 2020-08-18 VITALS — BP 114/68 | HR 68 | Ht 65.5 in | Wt 215.0 lb

## 2020-08-18 DIAGNOSIS — I25118 Atherosclerotic heart disease of native coronary artery with other forms of angina pectoris: Secondary | ICD-10-CM

## 2020-08-18 DIAGNOSIS — I5042 Chronic combined systolic (congestive) and diastolic (congestive) heart failure: Secondary | ICD-10-CM | POA: Diagnosis not present

## 2020-08-18 DIAGNOSIS — R0602 Shortness of breath: Secondary | ICD-10-CM

## 2020-08-18 DIAGNOSIS — I482 Chronic atrial fibrillation, unspecified: Secondary | ICD-10-CM

## 2020-08-18 DIAGNOSIS — E785 Hyperlipidemia, unspecified: Secondary | ICD-10-CM | POA: Diagnosis not present

## 2020-08-18 DIAGNOSIS — R002 Palpitations: Secondary | ICD-10-CM

## 2020-08-18 MED ORDER — CARVEDILOL 3.125 MG PO TABS: 3.1250 mg | ORAL_TABLET | Freq: Two times a day (BID) | ORAL | 3 refills | Status: DC

## 2020-08-18 MED ORDER — ENTRESTO 24-26 MG PO TABS: 1.0000 | ORAL_TABLET | Freq: Two times a day (BID) | ORAL | 3 refills | Status: DC

## 2020-08-18 NOTE — Progress Notes (Signed)
Cardiology Office Note  Date:  08/18/2020   ID:  Keshanna, Riso 1934/01/08, MRN [id number]  PCP:  Jerrol Banana., MD   No chief complaint on file.   HPI:  Tracy Mann is a 87 year old woman with a PMH significant for  chronic atrial fibrillation on Coumadin therapy,  diastolic and systolic dysfunction, EF 40 to 45% on echo 01/2018  hypertension,  hyperlipidemia,  morbid obesity,   moderate pulmonary hypertension inferior wall myocardial infarction felt to be secondary to a thrombus from atrial fib in October 2007 at [facility],  episodes of chest pain  Ejection fraction 30 to 35%, moderately elevated right heart pressures, July 16, 2020 following COVID who presents for followup Of her coronary artery disease and leg edema.  Last seen by myself in clinic July 28, 2020 Prior history torsemide noncompliance COVID infection earlier 2022  Recommendation last visit For weight >214: Take metolazone 2.5 mg , 30 min later take torsemide 40 mg For weight 211 to 214: take torsemide 40 mg daily For weight <211, take torsemide 20 mg daily  Last visit it was recommend she cut back on her watermelon, fluids  Felt she needed to " flush her kidneys" [name] was started, carvedilol low-dose started She has not started these medications  Seen by Dr. Rosanna Randy August 11, 2020  Deconditioned, family helping her to do everything, very sedentary Weight at home: 215 Weight in the office 219 pounds, BMI 36, height 5 foot 5  Lab work August 12, 2020 creatinine 1.09 BUN 24  Having some palpitations:  Now reports she is taking metoprolol tartrate 12.5 twice daily  Weight at home 211.8, down from 215 Today in office weight is 215,   Lots of anxiety concerning health on today's visit, lots of things to discuss Concerned about Lovenox bridge coming off warfarin for cortisone shot in her back Worried about her palpitations, discussed her atrial fibrillation which is  permanent Discussed the fullness in her abdomen and shortness of breath and need for fluid restriction  Again very sedentary, no walking program, weight running high, diet is poor, lives alone, legs are getting weak, wonders how they got weak  Discussed recent echocardiogram with her, " nobody told me" We had discussed this on her most recent office visit Echocardiogram July 16, 2020   1. Left ventricular ejection fraction, by estimation, is 30 to 35%. The  left ventricle has moderately decreased function. The left ventricle  demonstrates global hypokinesis. The left ventricular internal cavity size  was moderately dilated. Left  ventricular diastolic parameters are indeterminate.   2. Right ventricular systolic function is mildly reduced. The right  ventricular size is mildly enlarged. There is moderately elevated  pulmonary artery systolic pressure. The estimated right ventricular  systolic pressure is 02.6 mmHg.   3. Left atrial size was moderately dilated.   4. The mitral valve is normal in structure. Moderate mitral valve  regurgitation.   5. The inferior vena cava is dilated in size with <50% respiratory  variability, suggesting right atrial pressure of 15 mmHg.   Lab work reviewed A1c 6.8 Creatinine 1.0 BUN 29  Does not use her lymphedema compression pumps  HBA1C 9.1 on labs 12/2018 HBA1C 8.3 on 05/20/2019  EKG personally reviewed by myself on todays visit Shows atrial fibrillation ventricular rate 68bpm intraventricular conduction delay    PMH:   has a past medical history of Acute myocardial infarction of inferior wall ([place]), Arthritis, Chronic anticoagulation, Chronic atrial fibrillation (  [place]), Chronic combined systolic and diastolic CHF (congestive heart failure) ([place]), Essential hypertension, Hyperlipidemia, mixed, Mixed Ischemic/Nonischemic Cardiomyopathy, Nonobstructive coronary artery disease, Obesity, Thyroid disease, Type II diabetes mellitus ([place]), Venous  insufficiency, and Vitamin D deficiency.  PSH:    Past Surgical History:  Procedure Laterality Date   CARDIAC CATHETERIZATION  11/2012   [facility];EF 45-50%   CHOLECYSTECTOMY  1999   TUBAL LIGATION  1968   VESICOVAGINAL FISTULA CLOSURE W/ TAH  1996    Current Outpatient Medications  Medication Sig Dispense Refill   ACCU-CHEK AVIVA PLUS test strip USE WITH METER TO CHECK GLUCOSE BEFORE MEALS AND AT BEDTIME 200 strip 12   albuterol (VENTOLIN HFA) 108 (90 Base) MCG/ACT inhaler TAKE 2 PUFFS BY MOUTH EVERY 6 HOURS AS NEEDED FOR WHEEZE OR SHORTNESS OF BREATH 8.5 each 2   estradiol (ESTRACE) 0.1 MG/GM vaginal cream Place 1 Applicatorful vaginally at bedtime. 42.5 g 0   ezetimibe (ZETIA) 10 MG tablet TAKE 1 TABLET BY MOUTH EVERY DAY 90 tablet 2   flavoxATE (URISPAS) 100 MG tablet Take 1 tablet (100 mg total) by mouth 2 (two) times daily as needed for bladder spasms. 60 tablet 11   fluticasone (FLONASE) 50 MCG/ACT nasal spray SPRAY TWO SPRAYS IN EACH NOSTRIL ONCE DAILY 48 mL 3   HYDROcodone-acetaminophen (NORCO) 10-325 MG tablet Take 1 tablet by mouth every 6 (six) hours as needed for severe pain. Must last 30 days. 105 tablet 0   insulin glargine (LANTUS SOLOSTAR) 100 UNIT/ML Solostar Pen Inject 20 units nightly between 8-10 PM. Adjust as directed. Max dose is 50 units daily.     insulin lispro (HUMALOG KWIKPEN) 100 UNIT/ML KwikPen INJECT 10-20 UNITS THREE TIMES DAILY WITH EACH MEAL. 15 mL 12   Insulin Pen Needle (RELION PEN NEEDLE 31G/8MM) 31G X 8 MM MISC Use with insulin pen three times daily 100 each 12   levothyroxine (SYNTHROID) 100 MCG tablet Take by mouth.     levothyroxine (SYNTHROID) 112 MCG tablet Take 1 tablet (112 mcg total) by mouth daily. 90 tablet 1   lisinopril (ZESTRIL) 2.5 MG tablet TAKE 1 TABLET BY MOUTH EVERYDAY AT BEDTIME 90 tablet 1   LORazepam (ATIVAN) 1 MG tablet TAKE 1 TABLET BY MOUTH 2 TIMES DAILY AS NEEDED. 60 tablet 1   Magnesium 500 MG CAPS Take 1,000 mg by mouth daily.       metolazone (ZAROXOLYN) 2.5 MG tablet Take 1 tablet (2.5 mg total) by mouth as directed. For weight >214: Take metolazone 2.5 mg , 30 min later take torsemide 40 mg OR For weight 211 to 214: take torsemide 40 mg daily OR For weight <211, take torsemide 20 mg daily 30 tablet 3   metoprolol tartrate (LOPRESSOR) 25 MG tablet Take 12.5 mg by mouth 2 (two) times daily.     mupirocin ointment (BACTROBAN) 2 % Apply topically daily. 22 g 0   nitroGLYCERIN (NITROSTAT) 0.4 MG SL tablet PLACE 1 TABLET (0.4 MG TOTAL) UNDER THE TONGUE EVERY 5 (FIVE) MINUTES AS NEEDED FOR CHEST PAIN. 25 tablet 1   NONFORMULARY OR COMPOUNDED ITEM See pharmacy note- Not to use on any open skin or wound. 120 each 3   phenazopyridine (PYRIDIUM) 200 MG tablet Take 1 tablet (200 mg total) by mouth 3 (three) times daily as needed for pain. 10 tablet 0   potassium chloride SA (KLOR-CON) 20 MEQ tablet Take 1-2 tablets with metolazone     torsemide (DEMADEX) 20 MG tablet Take 1-2 tablets (20-40 mg total) by mouth  as directed. For weight >214: Take metolazone 2.5 mg , 30 min later take torsemide 40 mg OR For weight 211 to 214: take torsemide 40 mg daily OR For weight <211, take torsemide 20 mg daily 180 tablet 3   warfarin (COUMADIN) 3 MG tablet TAKE 1 TABLET BY MOUTH EVERY DAY 90 tablet 4   No current facility-administered medications for this visit.    Allergies:   Duloxetine; Other; Paroxetine; Sulfa antibiotics; Cephalexin; Clarithromycin; Diphenhydramine; Estrogens conjugated; Estrogens, conjugated; Sulfonamide derivatives; and Nitrofurantoin   Social History:  The patient  reports that she has never smoked. She has never used smokeless tobacco. She reports that she does not drink alcohol and does not use drugs.   Family History:   family history includes Heart disease in her mother; Thyroid disease in her father.    Review of Systems: Review of Systems  Constitutional: Negative.   HENT: Negative.    Respiratory:  Positive for  shortness of breath.   Cardiovascular:  Positive for leg swelling.  Gastrointestinal: Negative.   Musculoskeletal:  Positive for joint pain.  Neurological: Negative.   Psychiatric/Behavioral: Negative.    All other systems reviewed and are negative.  PHYSICAL EXAM: VS:  BP 114/68   Pulse 68   Ht 5' 5.5" (1.664 m)   Wt 215 lb (97.5 kg)   BMI 35.23 kg/m  , BMI Body mass index is 35.23 kg/m. Constitutional: Obese oriented to person, place, and time. No distress.  HENT:  Head: Grossly normal Eyes:  no discharge. No scleral icterus.  Neck: No JVD, no carotid bruits  Cardiovascular: Irregularly irregular no murmurs appreciated 1+ pitting lower extremity edema bilaterally Pulmonary/Chest: Clear to auscultation bilaterally, no wheezes or rails Abdominal: Soft.  no distension.  no tenderness.  Musculoskeletal: Normal range of motion Neurological:  normal muscle tone. Coordination normal. No atrophy Skin: Skin warm and dry Psychiatric: normal affect, pleasant  Recent Labs: 11/26/2019: Magnesium 2.1 12/31/2019: TSH <0.005 01/09/2020: ALT 14 07/05/2020: B Natriuretic Peptide 202.1 07/16/2020: Hemoglobin 12.3; Platelets 320 08/12/2020: BUN 24; Creatinine, Ser 1.09; Potassium 4.6; Sodium 142    Lipid Panel Lab Results  Component Value Date   CHOL 172 01/17/2019   HDL 57 01/17/2019   LDLCALC 94 01/17/2019   TRIG 119 01/17/2019      Wt Readings from Last 3 Encounters:  08/18/20 215 lb (97.5 kg)  08/11/20 215 lb (97.5 kg)  07/28/20 219 lb 4 oz (99.5 kg)     ASSESSMENT AND PLAN:  Acute on chronic diastolic and systolic CHF recent COVID, possibly contributing to drop in ejection fraction Component of medication noncompliance, missing torsemide and potassium Has been more compliant recently, following her diuretic sliding scale, weight down 4 pounds since 3 weeks ago Still with abdominal fullness, orthopnea Recommend she stop metoprolol tartrate start carvedilol 3.125 twice  daily Stop lisinopril start Entresto 24/26 mg twice daily -New diuretic regiment provided: More aggressive Goal for additional 4 pound weight loss For weight <211, take torsemide 40 mg daily For weight 212 to 215: Take metolazone 2.5 mg , 30 min later take torsemide 40 mg For weight >215: Take metolazone 2.5 mg , 30 min later take torsemide 40 mg, and torsemide 20 mg in the PM (2-3 pm)  Cardiomyopathy ejection fraction 45% now down to 35%, IVC dilated, moderately elevated right heart pressures Add carvedilol 3.125 twice daily, hold lisinopril 2 days, start Entresto 24/26 mg twice daily More aggressive diuretic regimen as above BMP last week stable Recheck BMP 3  weeks time  Mixed hyperlipidemia Continue Zetia Statin intolerance, no changes made  Essential hypertension - Plan: EKG 12-Lead Medication changes as above  Chronic atrial fibrillation (HCC) - Plan: EKG 12-Lead A. fib likely contributing to CHF On warfarin,  Rate controlled, carvedilol added  Obstructive sleep apnea Previously reported this was "not a major issue" Currently not on CPAP Likely contributing to heart failure symptoms  Type 2 diabetes mellitus without complication, with long-term current use of insulin ([place]) Stressed importance of weight loss through dietary changes Very sedentary, legs weak, no exercise program Likes to eat out   Total encounter time more than 35 minutes  Greater than 50% was spent in counseling and coordination of care with the patient   No orders of the defined types were placed in this encounter.    Signed, Esmond Plants, M.D., Ph.D. 08/18/2020  [place], [place]

## 2020-08-18 NOTE — Patient Instructions (Addendum)
Medication Instructions:  STOP Lisinopril metoprolol  START  carvedilol 3.125 twice daily On Saturday 7/23, start Entresto 24/26 mg twice a day One bottle of free sample given 30-day free trial card given Lot: [id number] Exp: 7/24  Metolazine & Torsemide For weight <211, take torsemide 40 mg daily  For weight 212 to 215: Take metolazone 2.5 mg , 30 min later take torsemide 40 mg  For weight >215: Take metolazone 2.5 mg , 30 min later take torsemide 40 mg, and torsemide 20 mg in the PM (2-3 pm)  If you need a refill on your cardiac medications before your next appointment, please call your pharmacy.   Lab work: No new labs needed  Will obtain labs at follow-up appt  Testing/Procedures: No new testing needed   Follow-Up: At [facility], you and your health needs are our priority.  As part of our continuing mission to provide you with exceptional heart care, we have created designated Provider Care Teams.  These Care Teams include your primary Cardiologist (physician) and Advanced Practice Providers (APPs -  Physician Assistants and Nurse Practitioners) who all work together to provide you with the care you need, when you need it.  You will need a follow up appointment in 1 month with Blanche East, PA-C  Providers on your designated Care Team:   Murray Hodgkins, NP Christell Faith, PA-C Marrianne Mood, PA-C Cadence Lawn, [place]  COVID-19 Vaccine Information can be found at: [email or web address].uk For questions related to vaccine distribution or appointments, please email [email or web address] or call [phone].

## 2020-08-20 ENCOUNTER — Telehealth: Payer: Self-pay | Admitting: Cardiovascular Disease

## 2020-08-20 NOTE — Telephone Encounter (Signed)
Pt c/o medication issue:  1. Name of Medication: Carvedilol   2. How are you currently taking this medication (dosage and times per day)? 3.125 mg bid.   3. Are you having a reaction (difficulty breathing--STAT)?   4. What is your medication issue? Wants to know if she can increase her dose frequency. Patient would like to take the medication every 8 hours. Patient feels the medication wears off around 7 hours and thinks she would feel better if she could take it more often.    Pt c/o medication issue:  1. Name of Medication: Entresto   2. How are you currently taking this medication (dosage and times per day)? Starting 7/23 depending on conversation with nurse   3. Are you having a reaction (difficulty breathing--STAT)?   4. What is your medication issue? Curious if the medication interaction of Entresto and diabetes. Patient read the instructions and is curious

## 2020-08-20 NOTE — Telephone Encounter (Signed)
Pt called in, asking if she can take Carvedilol 3.125 TID instead of BID. Feels that it is wearing off too quickly. Also concerned about interaction between [facility] and Diabetes. States she is supposed to start [facility] 7/23 based on conversation about Carvedilol.  Please advise. Thank you!

## 2020-08-23 ENCOUNTER — Telehealth: Payer: Self-pay

## 2020-08-23 ENCOUNTER — Other Ambulatory Visit: Payer: Self-pay

## 2020-08-23 DIAGNOSIS — R300 Dysuria: Secondary | ICD-10-CM

## 2020-08-23 MED ORDER — NITROFURANTOIN MONOHYD MACRO 100 MG PO CAPS
100.0000 mg | ORAL_CAPSULE | Freq: Two times a day (BID) | ORAL | 0 refills | Status: AC
Start: 2020-08-23 — End: 2020-08-26

## 2020-08-23 NOTE — Telephone Encounter (Signed)
Per Dr. Rockey Situ:  Tracy Mann can help protect her kidneys,  Often used in patients with diabetes, does not cause diabetes  Please start  Coreg BID until entresto started  Need to watch blood pressure before increasing coreg to TID  TG   Attempted to call pt. No answer. Lmtcb.

## 2020-08-23 NOTE — Progress Notes (Signed)
Patient came to the office and gave urine specimen. This was sent for culture. Macrobid was sent into the pharmacy.

## 2020-08-23 NOTE — Telephone Encounter (Signed)
Copied from [place] [id number]. Topic: Appointment Scheduling - Scheduling Inquiry for Clinic >> Aug 23, 2020  8:53 AM Lennox Solders wrote: reason for CRM: Pt is calling and would like to be seen today. Pt is having burning and chills when urinating this started yesterday. Please advise

## 2020-08-23 NOTE — Telephone Encounter (Signed)
Advised patient that per Dr Rosanna Randy, she may bring a specimen by and we send it off to the lab.

## 2020-08-24 ENCOUNTER — Ambulatory Visit: Payer: Medicare Other | Admitting: Family Medicine

## 2020-08-24 NOTE — Telephone Encounter (Signed)
Patient tried [name] took for only 2 days with last dose being on Sunday. She reports that her heart was much better, legs were stronger, and overall felt much better while on it however the side effects were unbearable. See symptoms below. She couldn't believe how good she did feel but she is requesting something else that helps that well. Patient also requested if any surgery would help she was willing to do that. Inquired what type of surgery she feels is needed and what for. She states her sister had defibrillator and that helped her. Discussed these symptoms are not typical with [facility] and that she should try it again. She did report taking furosemide but it causes her interstitial cystitis. Recommended to try again but she states that she prefers something else. I did review that these symptoms are not typical with this medication and inquired if she has seasonal allergies which could cause these. She was adamant that these symptoms were from [facility] and she needs something else. Advised I would send this message over to Dr. Rockey Situ for his review and we would be in touch with any recommendations. She verbalized understanding with no further questions.   Symptoms were: sinus headache ears was "acting up" watery eyes Stuffy nose Dizziness Cramps Burning in bladder (interstitial cystitis)  Nausea

## 2020-08-24 NOTE — Telephone Encounter (Signed)
Patient returning call.

## 2020-08-25 ENCOUNTER — Other Ambulatory Visit: Payer: Self-pay

## 2020-08-25 ENCOUNTER — Ambulatory Visit (INDEPENDENT_AMBULATORY_CARE_PROVIDER_SITE_OTHER): Payer: Medicare Other

## 2020-08-25 DIAGNOSIS — I482 Chronic atrial fibrillation, unspecified: Secondary | ICD-10-CM | POA: Diagnosis not present

## 2020-08-25 LAB — POCT INR
INR: 1.6 — AB (ref 2.0–3.0)
PT: 19.7

## 2020-08-25 LAB — URINE CULTURE: Organism ID, Bacteria: NO GROWTH

## 2020-08-25 NOTE — Patient Instructions (Signed)
Description   3 mg qd except 4 mg MWF

## 2020-08-30 NOTE — Telephone Encounter (Signed)
Was able to reach out to Tracy Mann regarding f/u with her [name], advised Dr. Rockey Situ reccommended:   She is welcome to go back on the lisinopril or try a watered-down version of Entresto, losartan 25 daily  Would stay on the carvedilol twice a day  Extra carvedilol for any tachypalpitations   Suggested  Lisinopril 2.5 mg daily at bedtime or Losartan 25 daily  Reviewed both medication with pt, educated that [name] was best option, but if she is addiment to come off Entresto d/t stated "side affects" (mention in past phone encounters), advised to research both medication as she is unable to female a decision at this time, also encourage to call pharmacy, they can educate further on medications and best option.  [name] thankful for the return call, will call back later today or tomorrow to give her decision on what medication she will like to replace Entresto.

## 2020-08-31 ENCOUNTER — Telehealth: Payer: Self-pay | Admitting: Cardiovascular Disease

## 2020-08-31 MED ORDER — LOSARTAN POTASSIUM 25 MG PO TABS
25.0000 mg | ORAL_TABLET | Freq: Every day | ORAL | 3 refills | Status: DC
Start: 2020-08-31 — End: 2020-10-14

## 2020-08-31 NOTE — Telephone Encounter (Signed)
Called pt back, educated pt on her new losartan dosing and when to take medication, okay to take with her other cardiac meds, stop entresto, and continue to hold lisinopril.   Advised to call helpdesk with [employer] to help her get back into her MyChart account.  Pt thankful for the return call, will pick up her new med this evening.

## 2020-08-31 NOTE — Addendum Note (Signed)
Addended by: Wynema Birch on: 08/31/2020 03:47 PM   Modules accepted: Orders

## 2020-08-31 NOTE — Telephone Encounter (Signed)
Losartan 25 mg QD script sent to [facility] pharmacy Entresto removed from med list.  Mychart message sent to pt to advise on med update

## 2020-08-31 NOTE — Telephone Encounter (Signed)
Patient would like a  call to discuss her medications. She is unsure what medications she can take with her Losartan.

## 2020-08-31 NOTE — Telephone Encounter (Signed)
Patient calling Would like to start the losartan 25 MG medication [facility] to [facility] Patient would like to know what to take along with it Patient says ok to leave a detailed VM

## 2020-09-08 ENCOUNTER — Encounter: Payer: Self-pay | Admitting: Family Medicine

## 2020-09-08 ENCOUNTER — Ambulatory Visit (INDEPENDENT_AMBULATORY_CARE_PROVIDER_SITE_OTHER): Payer: Medicare Other | Admitting: Family Medicine

## 2020-09-08 ENCOUNTER — Ambulatory Visit: Payer: Medicare Other

## 2020-09-08 ENCOUNTER — Other Ambulatory Visit: Payer: Self-pay

## 2020-09-08 VITALS — BP 117/68 | HR 72 | Temp 98.3°F | Resp 16 | Wt 211.0 lb

## 2020-09-08 DIAGNOSIS — I5042 Chronic combined systolic (congestive) and diastolic (congestive) heart failure: Secondary | ICD-10-CM | POA: Diagnosis not present

## 2020-09-08 DIAGNOSIS — I482 Chronic atrial fibrillation, unspecified: Secondary | ICD-10-CM

## 2020-09-08 DIAGNOSIS — I872 Venous insufficiency (chronic) (peripheral): Secondary | ICD-10-CM | POA: Diagnosis not present

## 2020-09-08 DIAGNOSIS — R300 Dysuria: Secondary | ICD-10-CM | POA: Diagnosis not present

## 2020-09-08 DIAGNOSIS — E1159 Type 2 diabetes mellitus with other circulatory complications: Secondary | ICD-10-CM

## 2020-09-08 DIAGNOSIS — Z6837 Body mass index (BMI) 37.0-37.9, adult: Secondary | ICD-10-CM

## 2020-09-08 DIAGNOSIS — I25118 Atherosclerotic heart disease of native coronary artery with other forms of angina pectoris: Secondary | ICD-10-CM

## 2020-09-08 DIAGNOSIS — F411 Generalized anxiety disorder: Secondary | ICD-10-CM

## 2020-09-08 DIAGNOSIS — Z794 Long term (current) use of insulin: Secondary | ICD-10-CM

## 2020-09-08 LAB — POCT INR
INR: 1.6 — AB (ref 2.0–3.0)
PT: 18.9

## 2020-09-08 LAB — POCT URINALYSIS DIPSTICK
Bilirubin, UA: NEGATIVE
Glucose, UA: POSITIVE — AB
Ketones, UA: NEGATIVE
Nitrite, UA: NEGATIVE
Protein, UA: NEGATIVE
Spec Grav, UA: 1.02 (ref 1.010–1.025)
Urobilinogen, UA: 0.2 E.U./dL
pH, UA: 6 (ref 5.0–8.0)

## 2020-09-08 MED ORDER — LEVOFLOXACIN 250 MG PO TABS
250.0000 mg | ORAL_TABLET | Freq: Every day | ORAL | 0 refills | Status: AC
Start: 2020-09-08 — End: 2020-09-13

## 2020-09-08 NOTE — Progress Notes (Signed)
Established patient visit   Patient: Tracy Mann   DOB: 11-Nov-1933   87 y.o. Female  MRN: [id number] Visit Date: 09/08/2020  Today's healthcare provider: Wilhemena Durie, MD   Chief Complaint  Patient presents with   Urinary Tract Infection   Atrial Fibrillation   Subjective    Urinary Tract Infection  This is a recurrent problem. The current episode started 1 to 4 weeks ago. The problem occurs every urination. The problem has been unchanged. The quality of the pain is described as burning and aching. There has been no fever. Associated symptoms include frequency and urgency. She has tried antibiotics for the symptoms.   She states she has had several weeks of symptoms. 25th urine was obtained and was negative for infection but she was treated with Macrobid for 3 days.  She states she only gets relief when she takes Levaquin.  Request Levaquin today. Having dysuria. She is using topical Estrace. Is appointment with urology next month. Follow up for A-fib  The patient was last seen for this 2 weeks ago. Changes made at last visit include changing to '3mg'$  daily except '4mg'$  on MWF.  She reports fair compliance with treatment. She feels that condition is Unchanged. She is not having side effects.   Lab Results  Component Value Date   INR 1.6 (A) 09/08/2020   INR 1.6 (A) 08/25/2020   INR 2.8 06/23/2020   Regarding her heart failure the patient states that after [facility] she felt terrible so she stopped it. Says she felt bad.      Medications: Outpatient Medications Prior to Visit  Medication Sig   ACCU-CHEK AVIVA PLUS test strip USE WITH METER TO CHECK GLUCOSE BEFORE MEALS AND AT BEDTIME   albuterol (VENTOLIN HFA) 108 (90 Base) MCG/ACT inhaler TAKE 2 PUFFS BY MOUTH EVERY 6 HOURS AS NEEDED FOR WHEEZE OR SHORTNESS OF BREATH   carvedilol (COREG) 3.125 MG tablet Take 1 tablet (3.125 mg total) by mouth 2 (two) times daily with a meal.   estradiol (ESTRACE) 0.1 MG/GM  vaginal cream Place 1 Applicatorful vaginally at bedtime.   ezetimibe (ZETIA) 10 MG tablet TAKE 1 TABLET BY MOUTH EVERY DAY   flavoxATE (URISPAS) 100 MG tablet Take 1 tablet (100 mg total) by mouth 2 (two) times daily as needed for bladder spasms.   fluticasone (FLONASE) 50 MCG/ACT nasal spray SPRAY TWO SPRAYS IN EACH NOSTRIL ONCE DAILY   HYDROcodone-acetaminophen (NORCO) 10-325 MG tablet Take 1 tablet by mouth every 6 (six) hours as needed for severe pain. Must last 30 days.   insulin glargine (LANTUS SOLOSTAR) 100 UNIT/ML Solostar Pen Inject 20 units nightly between 8-10 PM. Adjust as directed. Max dose is 50 units daily.   insulin lispro (HUMALOG KWIKPEN) 100 UNIT/ML KwikPen INJECT 10-20 UNITS THREE TIMES DAILY WITH EACH MEAL.   Insulin Pen Needle (RELION PEN NEEDLE 31G/8MM) 31G X 8 MM MISC Use with insulin pen three times daily   levothyroxine (SYNTHROID) 100 MCG tablet Take by mouth.   levothyroxine (SYNTHROID) 112 MCG tablet Take 1 tablet (112 mcg total) by mouth daily.   LORazepam (ATIVAN) 1 MG tablet TAKE 1 TABLET BY MOUTH 2 TIMES DAILY AS NEEDED.   losartan (COZAAR) 25 MG tablet Take 1 tablet (25 mg total) by mouth daily. Ok to take with your other cardiac meds   Magnesium 500 MG CAPS Take 1,000 mg by mouth daily.    metolazone (ZAROXOLYN) 2.5 MG tablet Take 1 tablet (2.5 mg  total) by mouth as directed. For weight >214: Take metolazone 2.5 mg , 30 min later take torsemide 40 mg OR For weight 211 to 214: take torsemide 40 mg daily OR For weight <211, take torsemide 20 mg daily   metoprolol tartrate (LOPRESSOR) 25 MG tablet Take 12.5 mg by mouth 2 (two) times daily.   mupirocin ointment (BACTROBAN) 2 % Apply topically daily.   nitroGLYCERIN (NITROSTAT) 0.4 MG SL tablet PLACE 1 TABLET (0.4 MG TOTAL) UNDER THE TONGUE EVERY 5 (FIVE) MINUTES AS NEEDED FOR CHEST PAIN.   NONFORMULARY OR COMPOUNDED ITEM See pharmacy note- Not to use on any open skin or wound.   phenazopyridine (PYRIDIUM) 200 MG  tablet Take 1 tablet (200 mg total) by mouth 3 (three) times daily as needed for pain.   potassium chloride SA (KLOR-CON) 20 MEQ tablet Take 1-2 tablets with metolazone   torsemide (DEMADEX) 20 MG tablet Take 1-2 tablets (20-40 mg total) by mouth as directed. For weight >214: Take metolazone 2.5 mg , 30 min later take torsemide 40 mg OR For weight 211 to 214: take torsemide 40 mg daily OR For weight <211, take torsemide 20 mg daily   warfarin (COUMADIN) 3 MG tablet TAKE 1 TABLET BY MOUTH EVERY DAY   No facility-administered medications prior to visit.    Review of Systems  Genitourinary:  Positive for frequency and urgency.       Objective    BP 117/68   Pulse 72   Temp 98.3 F (36.8 C)   Resp 16   Wt 211 lb (95.7 kg)   BMI 34.58 kg/m  BP Readings from Last 3 Encounters:  09/15/20 130/64  09/08/20 117/68  08/18/20 114/68   Wt Readings from Last 3 Encounters:  09/15/20 207 lb (93.9 kg)  09/08/20 211 lb (95.7 kg)  08/18/20 215 lb (97.5 kg)       Physical Exam Vitals reviewed.  Constitutional:      General: She is not in acute distress.    Appearance: Normal appearance. She is not ill-appearing.  HENT:     Head: Normocephalic.  Cardiovascular:     Rate and Rhythm: Normal rate and regular rhythm.     Pulses: Normal pulses.     Heart sounds: Normal heart sounds. No murmur heard.   No friction rub. No gallop.  Pulmonary:     Effort: Pulmonary effort is normal. No respiratory distress.     Breath sounds: Normal breath sounds. No stridor. No wheezing, rhonchi or rales.  Abdominal:     General: Bowel sounds are normal.     Palpations: Abdomen is soft.     Tenderness: There is no abdominal tenderness. There is no right CVA tenderness or left CVA tenderness.     Comments: No CVAT.  Musculoskeletal:     Right lower leg: No edema.     Left lower leg: No edema.  Skin:    General: Skin is warm and dry.  Neurological:     General: No focal deficit present.     Mental  Status: She is alert and oriented to person, place, and time.  Psychiatric:        Mood and Affect: Mood normal.        Behavior: Behavior normal.      Results for orders placed or performed in visit on 09/08/20  POCT INR  Result Value Ref Range   INR 1.6 (A) 2.0 - 3.0   PT 18.9     Assessment &  Plan     1. Dysuria Is insistent we will treat her with Levaquin 250 mg daily for 5 days.  Culture pending Urology appointment next month - POCT urinalysis dipstick - Urine Culture - levofloxacin (LEVAQUIN) 250 MG tablet; Take 1 tablet (250 mg total) by mouth daily for 5 days.  Dispense: 5 tablet; Refill: 0  2. Chronic atrial fibrillation (HCC) And are adjusted.  Low at 1.6 work so her increase in the dose little - POCT INR  3. Chronic combined systolic and diastolic congestive heart failure (HCC) Was intolerant and unwilling to take Entresto.  Patient has follow-up with cardiology/Dr. Rockey Situ  4. Atherosclerosis of native coronary artery of native heart with stable angina pectoris ([place]) Factors treated  5. Class 2 severe obesity due to excess calories with serious comorbidity and body mass index (BMI) of 37.0 to 37.9 in adult ([place])   6. Type 2 diabetes mellitus with other circulatory complication, with long-term current use of insulin ([place]) Followed by endocrinology  7. Anxiety, generalized Chronic issue patient is on high-dose lorazepam.  Start cutting dose back just because of her age.  And to go to 0.5 mg on next visit  8. Chronic venous insufficiency    No follow-ups on file.      I, Wilhemena Durie, MD, have reviewed all documentation for this visit. The documentation on 09/17/20 for the exam, diagnosis, procedures, and orders are all accurate and complete.    Tunis Gentle Cranford Mon, MD  [facility] [phone] (phone) [phone] (fax)  [place]

## 2020-09-08 NOTE — Patient Instructions (Signed)
Take '4mg'$  of Coumadin daily. Recheck in 2 weeks.

## 2020-09-12 LAB — URINE CULTURE

## 2020-09-12 NOTE — Progress Notes (Signed)
PROVIDER NOTE: Information contained herein reflects review and annotations entered in association with encounter. Interpretation of such information and data should be left to medically-trained personnel. Information provided to patient can be located elsewhere in the medical record under "Patient Instructions". Document created using STT-dictation technology, any transcriptional errors that may result from process are unintentional.    Patient: Tracy Mann  Service Category: E/M  Provider: Gaspar Cola, MD  DOB: [birth year]  DOS: 09/15/2020  Specialty: Interventional Pain Management  MRN: [id number]  Setting: Ambulatory outpatient  PCP: Jerrol Banana., MD  Type: Established Patient    Referring Provider: Jerrol Banana.,*  Location: Office  Delivery: Face-to-face     HPI  Tracy Mann, a 87 y.o. year old female, is here today because of her Chronic pain syndrome [G89.4]. Tracy Mann primary complain today is Hip Pain (both) Last encounter: My last encounter with her was on 07/01/2020. Pertinent problems: Tracy Mann has Cervical muscle strain; DDD (degenerative disc disease), lumbosacral; Chronic venous insufficiency; Chronic pain syndrome; Abnormal MRI, lumbar spine (08/22/2019); Lumbar facet hypertrophy (Multilevel) (Bilateral); Lumbar central spinal stenosis (SEVERE) (L3-4) with neurogenic claudication; Grade 1 Anterolisthesis of lumbar spine (L4/L5); Lumbar facet arthropathy (L3-4 right-sided facet edema/joint effusion); Lumbar lateral recess stenosis (L4-5) (Right); Chronic low back pain (3ry area of Pain) (Bilateral) w/ sciatica (Bilateral); Lumbosacral radiculopathy at L5 (Bilateral); Chronic lower extremity pain (1ry area of Pain) (Bilateral); Lumbosacral facet syndrome; Chronic hip pain (2ry area of Pain) (Bilateral) (R>L); Osteoarthritis involving multiple joints; Osteoarthritis of knee (Right); Osteoarthritis of facet joint of lumbar spine; Osteoarthritis of lumbar spine;  Bilateral lower extremity edema; Cellulitis of right lower leg; Chronic knee pain (Bilateral); Peripheral vascular disease ([place]) (lower extremity) (Bilateral); Cervicalgia; Chronic upper back pain; and Lumbosacral spondylosis with radiculopathy on their pertinent problem list. Pain Assessment: Severity of Chronic pain is reported as a 7 /10. Location: Hip Left, Right/pain radiaties down both hip, down her leg to the top of her foot. Onset: More than a month ago. Quality: Aching, Burning, Constant, Numbness. Timing: Constant. Modifying factor(s): meds, laying down, compound pain cream, heat. Vitals:  height is 5' 5" (1.651 m) and weight is 207 lb (93.9 kg). Her temporal temperature is 97.1 F (36.2 C) (abnormal). Her blood pressure is 130/64 and her pulse is 99. Her respiration is 16 and oxygen saturation is 92%.   Reason for encounter: medication management.   The patient indicates doing well with the current medication regimen. No adverse reactions or side effects reported to the medications.   Today I have spent an inordinate amount of time with this patient explaining to her about the procedure, the risk, and the possible complications.  I make sure to give her no guarantees or warranties about the procedure.  I also pointed out to her that while she is off of her Coumadin, her risk of problems as a consequence of that will come back.  Despite the fact that Dr. Rosanna Randy has given clearance for [place] to stop her Coumadin for 5 days prior to the procedure, she is still very nervous about it and continues to give me a excuses while at the same time telling me that "she wants to have the epidural, but......". Today's visit went on for about an hour and essentially she kept going over this same issue.  I did explain to her about the steroids and how they can increase her blood sugar I also explained to her how they can make her  retain some fluid, but I pointed out that if she is on diuretics and she has ever being  hospitalized due to diabetic ketoacidosis or diabetic coma, then it is unlikely that she was going to have them any problems with the medications and the epidural.  RTCB: 12/19/2020  Pharmacotherapy Assessment  Analgesic: Hydrocodone/APAP 10/325, 1 tab p.o. 4 times daily (last filled on 11/12/2019) MME/day: 40 mg/day   Monitoring: [place] PMP: PDMP reviewed during this encounter.       Pharmacotherapy: No side-effects or adverse reactions reported. Compliance: No problems identified. Effectiveness: Clinically acceptable.  Chauncey Fischer, RN  09/15/2020  2:54 PM  Sign when Signing Visit Nursing Pain Medication Assessment:  Safety precautions to be maintained throughout the outpatient stay will include: orient to surroundings, keep bed in low position, maintain call bell within reach at all times, provide assistance with transfer out of bed and ambulation.  Medication Inspection Compliance: Pill count conducted under aseptic conditions, in front of the patient. Neither the pills nor the bottle was removed from the patient's sight at any time. Once count was completed pills were immediately returned to the patient in their original bottle.  Medication: See above Pill/Patch Count:  65 of 105 pills remain Pill/Patch Appearance: Markings consistent with prescribed medication Bottle Appearance: Standard pharmacy container. Clearly labeled. Filled Date: 7 / 23 / 2022 Last Medication intake:  Today Safety precautions to be maintained throughout the outpatient stay will include: orient to surroundings, keep bed in low position, maintain call bell within reach at all times, provide assistance with transfer out of bed and ambulation.      UDS:  Summary  Date Value Ref Range Status  03/10/2020 Note  Final    Comment:    ==================================================================== [employer] [facility] (MW) ==================================================================== Test                              Result       Flag       Units  Drug Present   Lorazepam                      >1205                   ng/mg creat    Source of lorazepam is a scheduled prescription medication.    Hydrocodone                    1742                    ng/mg creat   Hydromorphone                  209                     ng/mg creat   Dihydrocodeine                 576                     ng/mg creat   Norhydrocodone                 >3012                   ng/mg creat    Sources of hydrocodone include scheduled prescription medications.    Hydromorphone, dihydrocodeine and norhydrocodone are expected  metabolites of hydrocodone. Hydromorphone and dihydrocodeine are    also available as scheduled prescription medications.  ==================================================================== Test                      Result    Flag   Units      Ref Range   Creatinine              166              mg/dL      >=20 ==================================================================== Declared Medications:  Medication list was not provided. ==================================================================== For clinical consultation, please call [phone]. ====================================================================      ROS  Constitutional: Denies any fever or chills Gastrointestinal: No reported hemesis, hematochezia, vomiting, or acute GI distress Musculoskeletal: Denies any acute onset joint swelling, redness, loss of ROM, or weakness Neurological: No reported episodes of acute onset apraxia, aphasia, dysarthria, agnosia, amnesia, paralysis, loss of coordination, or loss of consciousness  Medication Review  HYDROcodone-acetaminophen, Insulin Pen Needle, LORazepam, Magnesium, NONFORMULARY OR COMPOUNDED ITEM, albuterol, carvedilol, estradiol, ezetimibe, flavoxATE, fluticasone, glucose blood, insulin glargine, insulin lispro, levothyroxine, losartan, metolazone, metoprolol  tartrate, mupirocin ointment, nitroGLYCERIN, phenazopyridine, potassium chloride SA, rOPINIRole, torsemide, and warfarin  History Review  Allergy: Tracy Mann is allergic to duloxetine; other; paroxetine; sulfa antibiotics; cephalexin; clarithromycin; diphenhydramine; estrogens conjugated; estrogens, conjugated; sulfonamide derivatives; and nitrofurantoin. Drug: Tracy Mann  reports no history of drug use. Alcohol:  reports no history of alcohol use. Tobacco:  reports that she has never smoked. She has never used smokeless tobacco. Social: Tracy Mann  reports that she has never smoked. She has never used smokeless tobacco. She reports that she does not drink alcohol and does not use drugs. Medical:  has a past medical history of Acute myocardial infarction of inferior wall ([place]), Arthritis, Chronic anticoagulation, Chronic atrial fibrillation ([place]), Chronic combined systolic and diastolic CHF (congestive heart failure) ([place]), Essential hypertension, Hyperlipidemia, mixed, Mixed Ischemic/Nonischemic Cardiomyopathy, Nonobstructive coronary artery disease, Obesity, Thyroid disease, Type II diabetes mellitus ([place]), Venous insufficiency, and Vitamin D deficiency. Surgical: Tracy Mann  has a past surgical history that includes Vesicovaginal fistula closure w/ TAH (1996); Tubal ligation (1968); Cholecystectomy (1999); and Cardiac catheterization (11/2012). Family: family history includes Heart disease in her mother; Thyroid disease in her father.  Laboratory Chemistry Profile   Renal Lab Results  Component Value Date   BUN 24 08/12/2020   CREATININE 1.09 (H) 08/12/2020   BCR 22 08/12/2020   GFRAA 73 12/31/2019   GFRNONAA 43 (L) 07/05/2020    Hepatic Lab Results  Component Value Date   AST 26 01/09/2020   ALT 14 01/09/2020   ALBUMIN 3.4 (L) 01/09/2020   ALKPHOS 106 01/09/2020    Electrolytes Lab Results  Component Value Date   NA 142 08/12/2020   K 4.6 08/12/2020   CL 100 08/12/2020   CALCIUM 9.4  08/12/2020   MG 2.1 11/26/2019   PHOS 3.9 09/08/2014    Bone Lab Results  Component Value Date   25OHVITD1 43 11/26/2019   25OHVITD2 9.2 11/26/2019   25OHVITD3 34 11/26/2019    Inflammation (CRP: Acute Phase) (ESR: Chronic Phase) Lab Results  Component Value Date   CRP 4 11/26/2019   ESRSEDRATE 45 (H) 11/26/2019   LATICACIDVEN 1.4 01/09/2020         Note: Above Lab results reviewed.  Recent Imaging Review  ECHOCARDIOGRAM COMPLETE    ECHOCARDIOGRAM REPORT       Patient Name:   Tracy Mann  Date of Exam: 07/16/2020 Medical Rec #:  [id number]      Height:       65.5 in Accession #:    [id number]     Weight:       220.0 lb Date of Birth:  1933/07/24      BSA:          2.072 m Patient Age:    52 years       BP:           102/58 mmHg Patient Gender: F              HR:           98 bpm. Exam Location:  [facility]  Procedure: 2D Echo, Cardiac Doppler, Color Doppler and Intracardiac            Opacification Agent  Indications:    R06.02 SOB   History:        Patient has prior history of Echocardiogram examinations, most                 recent 02/15/2018. Cardiomyopathy and CHF, CAD and Previous                 Myocardial Infarction, PAD, Mitral Valve Disease,                 Arrythmias:Bradycardia and Atrial Fibrillation,                 Signs/Symptoms:Shortness of Breath, Chest Pain and Fatigue; Risk                 Factors:Hypertension, Dyslipidemia and Non-Smoker.   Sonographer:    Pilar Jarvis RDMS, RVT, RDCS Referring Phys: [id number] Robin Searing VISSER  IMPRESSIONS   1. Left ventricular ejection fraction, by estimation, is 30 to 35%. The left ventricle has moderately decreased function. The left ventricle demonstrates global hypokinesis. The left ventricular internal cavity size was moderately dilated. Left  ventricular diastolic parameters are indeterminate.  2. Right ventricular systolic function is mildly reduced. The right ventricular size is mildly enlarged. There  is moderately elevated pulmonary artery systolic pressure. The estimated right ventricular systolic pressure is 38.2 mmHg.  3. Left atrial size was moderately dilated.  4. The mitral valve is normal in structure. Moderate mitral valve regurgitation.  5. The inferior vena cava is dilated in size with <50% respiratory variability, suggesting right atrial pressure of 15 mmHg.  FINDINGS  Left Ventricle: Left ventricular ejection fraction, by estimation, is 30 to 35%. The left ventricle has moderately decreased function. The left ventricle demonstrates global hypokinesis. Definity contrast agent was given IV to delineate the left  ventricular endocardial borders. The left ventricular internal cavity size was moderately dilated. There is no left ventricular hypertrophy. Left ventricular diastolic parameters are indeterminate.  Right Ventricle: The right ventricular size is mildly enlarged. No increase in right ventricular wall thickness. Right ventricular systolic function is mildly reduced. There is moderately elevated pulmonary artery systolic pressure. The tricuspid  regurgitant velocity is 2.70 m/s, and with an assumed right atrial pressure of 20 mmHg, the estimated right ventricular systolic pressure is 50.5 mmHg.  Left Atrium: Left atrial size was moderately dilated.  Right Atrium: Right atrial size was normal in size.  Pericardium: There is no evidence of pericardial effusion.  Mitral Valve: The mitral valve is normal in structure. Moderate mitral valve regurgitation. No evidence of mitral valve stenosis.  Tricuspid Valve: The tricuspid valve is normal in structure. Tricuspid valve  regurgitation is mild . No evidence of tricuspid stenosis.  Aortic Valve: The aortic valve is normal in structure. Aortic valve regurgitation is not visualized. No aortic stenosis is present.  Pulmonic Valve: The pulmonic valve was normal in structure. Pulmonic valve regurgitation is not visualized. No evidence of  pulmonic stenosis.  Aorta: The aortic root is normal in size and structure.  Venous: The inferior vena cava is dilated in size with less than 50% respiratory variability, suggesting right atrial pressure of 15 mmHg.  IAS/Shunts: No atrial level shunt detected by color flow Doppler.    LEFT VENTRICLE PLAX 2D LVIDd:         5.60 cm LVIDs:         4.70 cm LV PW:         0.80 cm LV IVS:        0.80 cm LVOT diam:     2.00 cm LV SV:         45 LV SV Index:   22 LVOT Area:     3.14 cm    RIGHT VENTRICLE            IVC RV Basal diam:  3.80 cm    IVC diam: 2.70 cm RV S prime:     9.14 cm/s TAPSE (M-mode): 1.4 cm  LEFT ATRIUM             Index       RIGHT ATRIUM           Index LA diam:        5.20 cm 2.51 cm/m  RA Area:     28.60 cm LA Vol (A2C):   90.0 ml 43.44 ml/m RA Volume:   103.00 ml 49.72 ml/m LA Vol (A4C):   70.8 ml 34.18 ml/m LA Biplane Vol: 80.5 ml 38.86 ml/m  AORTIC VALVE             PULMONIC VALVE LVOT Vmax:   70.93 cm/s  PV Vmax:       0.57 m/s LVOT Vmean:  50.000 cm/s PV Peak grad:  1.3 mmHg LVOT VTI:    0.144 m   AORTA Ao Root diam: 3.00 cm Ao Asc diam:  2.80 cm Ao Arch diam: 2.3 cm  MITRAL VALVE                TRICUSPID VALVE MV Area (PHT): 5.34 cm     TR Peak grad:   29.2 mmHg MV Decel Time: 142 msec     TR Vmax:        270.00 cm/s MV E velocity: 113.50 cm/s                             SHUNTS                             Systemic VTI:  0.14 m                             Systemic Diam: 2.00 cm  Ida Rogue MD Electronically signed by Ida Rogue MD Signature Date/Time: 07/17/2020/2:59:27 PM      Final   Note: Reviewed        Physical Exam  General appearance: Well nourished, well developed, and well hydrated. In no apparent acute distress Mental status: Alert, oriented x 3 (person, place, & time)  Respiratory: No evidence of acute respiratory distress Eyes: PERLA Vitals: BP 130/64 (BP Location: Right Arm, Patient Position: Sitting,  Cuff Size: Large)   Pulse 99   Temp (!) 97.1 F (36.2 C) (Temporal)   Resp 16   Ht 5' 5" (1.651 m)   Wt 207 lb (93.9 kg)   SpO2 92%   BMI 34.45 kg/m  BMI: Estimated body mass index is 34.45 kg/m as calculated from the following:   Height as of this encounter: 5' 5" (1.651 m).   Weight as of this encounter: 207 lb (93.9 kg). Ideal: Ideal body weight: 57 kg (125 lb 10.6 oz) Adjusted ideal body weight: 71.8 kg (158 lb 3.2 oz)  Assessment   Status Diagnosis  Controlled Controlled Controlled 1. Chronic pain syndrome   2. Chronic lower extremity pain (1ry area of Pain) (Bilateral)   3. Lumbosacral spondylosis with radiculopathy   4. Lumbar central spinal stenosis (SEVERE) (L3-4) with neurogenic claudication   5. Lumbar lateral recess stenosis (L4-5) (Right)   6. Lumbosacral radiculopathy at L5 (Bilateral)   7. Chronic hip pain (2ry area of Pain) (Bilateral) (R>L)   8. Chronic low back pain (3ry area of Pain) (Bilateral) w/ sciatica (Bilateral)   9. DDD (degenerative disc disease), lumbosacral   10. Grade 1 Anterolisthesis of lumbar spine (L4/L5)   11. Pharmacologic therapy   12. Chronic anticoagulation (Coumadin)   13. Chronic use of opiate for therapeutic purpose   14. Long term current use of opiate analgesic   15. Long-term current use of benzodiazepine   16. Encounter for medication management      Updated Problems: Problem  Long-Term Current Use of Benzodiazepine  Long Term Current Use of Opiate Analgesic    Plan of Care  Problem-specific:  No problem-specific Assessment & Plan notes found for this encounter.  Tracy Mann has a current medication list which includes the following long-term medication(s): albuterol, carvedilol, ezetimibe, fluticasone, insulin lispro, levothyroxine, losartan, metolazone, metoprolol tartrate, nitroglycerin, potassium chloride sa, torsemide, warfarin, [START ON 09/20/2020] hydrocodone-acetaminophen, [START ON 10/20/2020]  hydrocodone-acetaminophen, [START ON 11/19/2020] hydrocodone-acetaminophen, and [DISCONTINUED] ropinirole.  Pharmacotherapy (Medications Ordered): Meds ordered this encounter  Medications   HYDROcodone-acetaminophen ([place]) 10-325 MG tablet    Sig: Take 1 tablet by mouth every 6 (six) hours as needed for severe pain. Must last 30 days.    Dispense:  105 tablet    Refill:  0    Not a duplicate. Do NOT delete! Dispense 1 day early if closed on refill date. Avoid benzodiazepines within 8 hours of opioids. Do not send refill requests.   HYDROcodone-acetaminophen ([place]) 10-325 MG tablet    Sig: Take 1 tablet by mouth every 6 (six) hours as needed for severe pain. Must last 30 days.    Dispense:  105 tablet    Refill:  0    Not a duplicate. Do NOT delete! Dispense 1 day early if closed on refill date. Avoid benzodiazepines within 8 hours of opioids. Do not send refill requests.   HYDROcodone-acetaminophen ([place]) 10-325 MG tablet    Sig: Take 1 tablet by mouth every 6 (six) hours as needed for severe pain. Must last 30 days.    Dispense:  105 tablet    Refill:  0    Not a duplicate. Do NOT delete! Dispense 1 day early if closed on refill date. Avoid benzodiazepines within 8 hours of opioids. Do not send refill requests.    Orders:  Orders Placed This Encounter  Procedures  Lumbar Epidural Injection    Standing Status:   Future    Standing Expiration Date:   10/16/2020    Scheduling Instructions:     Procedure: Interlaminar Lumbar Epidural Steroid injection (LESI)  L4-5     Laterality: Midline     Sedation: None required.     Timeframe: Blood thinner protocol    Order Specific Question:   Where will this procedure be performed?    Answer:   [facility]   Informed Consent Details: Physician/Practitioner Attestation; Transcribe to consent form and obtain patient signature    Note: Always confirm laterality of pain with Tracy Mann, before procedure. Transcribe to consent form and  obtain patient signature.    Order Specific Question:   Physician/Practitioner attestation of informed consent for procedure/surgical case    Answer:   I, the physician/practitioner, attest that I have discussed with the patient the benefits, risks, side effects, alternatives, likelihood of achieving goals and potential problems during recovery for the procedure that I have provided informed consent.    Order Specific Question:   Procedure    Answer:   Lumbar epidural steroid injection under fluoroscopic guidance    Order Specific Question:   Physician/Practitioner performing the procedure    Answer:   Francisco A. Dossie Arbour, MD    Order Specific Question:   Indication/Reason    Answer:   Low back and/or lower extremity pain secondary to lumbar radiculitis   Blood Thinner Instructions to Nursing    Always make sure patient has clearance from prescribing physician to stop blood thinners for interventional therapies. If the patient requires a Lovenox-bridge therapy, make sure arrangements are made to institute it with the assistance of the PCP.    Scheduling Instructions:     Have Tracy Mann stop the Coumadin (Warfarin) X 5 days prior to procedure or surgery.    Follow-up plan:   Return in about 3 months (around 12/19/2020) for Eval-day(M,W), (F2F), (MM), & (Clinic) procedure: (ML) L4-5 LESI, (Blood Thinner Protocol).     Interventional Therapies  Risk  Complexity Considerations:   Estimated body mass index is 35.73 kg/m as calculated from the following:   Height as of this encounter: 5' 5.5" (1.664 m).   Weight as of this encounter: 218 lb (98.9 kg). NOTE: COUMADIN Anticoagulation (Stop: 5 days  Restart: 2 hours) Decreased GFR   Planned  Pending:   Diagnostic bilateral L3 TFESI #1  Diagnostic right L4 TFESI #1  Diagnostic right L3-4 LESI #1  Diagnostic right L4 TFESI #1  The patient will also need to have x-rays of her cervical and thoracic spine since there are no official reports from  the [facility].   Under consideration:   Diagnostic bilateral L4 TFESI x1 (Done 09/12/2019 - Dr. Alba Destine [[facility]]) Diagnostic midline caudal ESI #1 + diagnostic epidurogram  Diagnostic bilateral IA hip joint injection #1  Diagnostic right L3-4 LESI #1  Diagnostic right L4 TFESI #1  Diagnostic bilateral L3 transforaminal ESI #1  Diagnostic bilateral lumbar facet block #1    Completed:   None at this time   Therapeutic  Palliative (PRN) options:   None established    Recent Visits No visits were found meeting these conditions. Showing recent visits within past 90 days and meeting all other requirements Today's Visits Date Type Provider Dept  09/15/20 Office Visit Milinda Pointer, MD [facility]  Showing today's visits and meeting all other requirements Future Appointments Date Type Provider Dept  09/28/20 Appointment Milinda Pointer, MD  [facility]  Showing future appointments within next 90 days and meeting all other requirements I discussed the assessment and treatment plan with the patient. The patient was provided an opportunity to ask questions and all were answered. The patient agreed with the plan and demonstrated an understanding of the instructions.  Patient advised to call back or seek an in-person evaluation if the symptoms or condition worsens.  Duration of encounter: 68 minutes.  Note by: Gaspar Cola, MD Date: 09/15/2020; Time: 5:07 PM

## 2020-09-13 ENCOUNTER — Ambulatory Visit: Payer: Medicare Other | Admitting: Cardiovascular Disease

## 2020-09-15 ENCOUNTER — Encounter: Payer: Self-pay | Admitting: Pain Medicine

## 2020-09-15 ENCOUNTER — Ambulatory Visit: Payer: Medicare Other | Attending: Pain Medicine | Admitting: Pain Medicine

## 2020-09-15 ENCOUNTER — Other Ambulatory Visit: Payer: Self-pay

## 2020-09-15 VITALS — BP 130/64 | HR 99 | Temp 97.1°F | Resp 16 | Ht 65.0 in | Wt 207.0 lb

## 2020-09-15 DIAGNOSIS — M79605 Pain in left leg: Secondary | ICD-10-CM | POA: Diagnosis not present

## 2020-09-15 DIAGNOSIS — M5442 Lumbago with sciatica, left side: Secondary | ICD-10-CM | POA: Insufficient documentation

## 2020-09-15 DIAGNOSIS — G894 Chronic pain syndrome: Secondary | ICD-10-CM | POA: Insufficient documentation

## 2020-09-15 DIAGNOSIS — M79604 Pain in right leg: Secondary | ICD-10-CM | POA: Diagnosis not present

## 2020-09-15 DIAGNOSIS — G8929 Other chronic pain: Secondary | ICD-10-CM | POA: Diagnosis not present

## 2020-09-15 DIAGNOSIS — M5441 Lumbago with sciatica, right side: Secondary | ICD-10-CM | POA: Insufficient documentation

## 2020-09-15 DIAGNOSIS — M5417 Radiculopathy, lumbosacral region: Secondary | ICD-10-CM | POA: Diagnosis not present

## 2020-09-15 DIAGNOSIS — M4316 Spondylolisthesis, lumbar region: Secondary | ICD-10-CM | POA: Diagnosis not present

## 2020-09-15 DIAGNOSIS — M5137 Other intervertebral disc degeneration, lumbosacral region: Secondary | ICD-10-CM | POA: Diagnosis not present

## 2020-09-15 DIAGNOSIS — Z79891 Long term (current) use of opiate analgesic: Secondary | ICD-10-CM | POA: Diagnosis not present

## 2020-09-15 DIAGNOSIS — Z7901 Long term (current) use of anticoagulants: Secondary | ICD-10-CM | POA: Insufficient documentation

## 2020-09-15 DIAGNOSIS — M4727 Other spondylosis with radiculopathy, lumbosacral region: Secondary | ICD-10-CM | POA: Insufficient documentation

## 2020-09-15 DIAGNOSIS — M25552 Pain in left hip: Secondary | ICD-10-CM | POA: Insufficient documentation

## 2020-09-15 DIAGNOSIS — Z79899 Other long term (current) drug therapy: Secondary | ICD-10-CM | POA: Diagnosis not present

## 2020-09-15 DIAGNOSIS — M48062 Spinal stenosis, lumbar region with neurogenic claudication: Secondary | ICD-10-CM | POA: Insufficient documentation

## 2020-09-15 DIAGNOSIS — M48061 Spinal stenosis, lumbar region without neurogenic claudication: Secondary | ICD-10-CM | POA: Insufficient documentation

## 2020-09-15 DIAGNOSIS — M25551 Pain in right hip: Secondary | ICD-10-CM | POA: Diagnosis not present

## 2020-09-15 MED ORDER — HYDROCODONE-ACETAMINOPHEN 10-325 MG PO TABS: 1.0000 | ORAL_TABLET | Freq: Four times a day (QID) | ORAL | 0 refills | Status: DC | PRN

## 2020-09-15 NOTE — Patient Instructions (Signed)
____________________________________________________________________________________________  Medication Rules  Purpose: To inform patients, and their family members, of our rules and regulations.  Applies to: All patients receiving prescriptions (written or electronic).  Pharmacy of record: Pharmacy where electronic prescriptions will be sent. If written prescriptions are taken to a different pharmacy, please inform the nursing staff. The pharmacy listed in the electronic medical record should be the one where you would like electronic prescriptions to be sent.  Electronic prescriptions: In compliance with the [place] Strengthen Opioid Misuse Prevention (STOP) Act of 2017 (Session Law 2017-74/H243), effective January 30, 2018, all controlled substances must be electronically prescribed. Calling prescriptions to the pharmacy will cease to exist.  Prescription refills: Only during scheduled appointments. Applies to all prescriptions.  NOTE: The following applies primarily to controlled substances (Opioid* Pain Medications).   Type of encounter (visit): For patients receiving controlled substances, face-to-face visits are required. (Not an option or up to the patient.)  Patient's responsibilities: Pain Pills: Bring all pain pills to every appointment (except for procedure appointments). Pill Bottles: Bring pills in original pharmacy bottle. Always bring the newest bottle. Bring bottle, even if empty. Medication refills: You are responsible for knowing and keeping track of what medications you take and those you need refilled. The day before your appointment: write a list of all prescriptions that need to be refilled. The day of the appointment: give the list to the admitting nurse. Prescriptions will be written only during appointments. No prescriptions will be written on procedure days. If you forget a medication: it will not be "Called in", "Faxed", or "electronically sent". You will  need to get another appointment to get these prescribed. No early refills. Do not call asking to have your prescription filled early. Prescription Accuracy: You are responsible for carefully inspecting your prescriptions before leaving our office. Have the discharge nurse carefully go over each prescription with you, before taking them home. Make sure that your name is accurately spelled, that your address is correct. Check the name and dose of your medication to make sure it is accurate. Check the number of pills, and the written instructions to make sure they are clear and accurate. Make sure that you are given enough medication to last until your next medication refill appointment. Taking Medication: Take medication as prescribed. When it comes to controlled substances, taking less pills or less frequently than prescribed is permitted and encouraged. Never take more pills than instructed. Never take medication more frequently than prescribed.  Inform other Doctors: Always inform, all of your healthcare providers, of all the medications you take. Pain Medication from other Providers: You are not allowed to accept any additional pain medication from any other Doctor or Healthcare provider. There are two exceptions to this rule. (see below) In the event that you require additional pain medication, you are responsible for notifying us, as stated below. Cough Medicine: Often these contain an opioid, such as codeine or hydrocodone. Never accept or take cough medicine containing these opioids if you are already taking an opioid* medication. The combination may cause respiratory failure and death. Medication Agreement: You are responsible for carefully reading and following our Medication Agreement. This must be signed before receiving any prescriptions from our practice. Safely store a copy of your signed Agreement. Violations to the Agreement will result in no further prescriptions. (Additional copies of our  Medication Agreement are available upon request.) Laws, Rules, & Regulations: All patients are expected to follow all Federal and State Laws, Statutes, Rules, & Regulations. Ignorance of   the Laws does not constitute a valid excuse.  Illegal drugs and Controlled Substances: The use of illegal substances (including, but not limited to marijuana and its derivatives) and/or the illegal use of any controlled substances is strictly prohibited. Violation of this rule may result in the immediate and permanent discontinuation of any and all prescriptions being written by our practice. The use of any illegal substances is prohibited. Adopted CDC guidelines & recommendations: Target dosing levels will be at or below 60 MME/day. Use of benzodiazepines** is not recommended.  Exceptions: There are only two exceptions to the rule of not receiving pain medications from other Healthcare Providers. Exception #1 (Emergencies): In the event of an emergency (i.e.: accident requiring emergency care), you are allowed to receive additional pain medication. However, you are responsible for: As soon as you are able, call our office [phone], at any time of the day or night, and leave a message stating your name, the date and nature of the emergency, and the name and dose of the medication prescribed. In the event that your call is answered by a member of our staff, make sure to document and save the date, time, and the name of the person that took your information.  Exception #2 (Planned Surgery): In the event that you are scheduled by another doctor or dentist to have any type of surgery or procedure, you are allowed (for a period no longer than 30 days), to receive additional pain medication, for the acute post-op pain. However, in this case, you are responsible for picking up a copy of our "Post-op Pain Management for Surgeons" handout, and giving it to your surgeon or dentist. This document is available at our office, and  does not require an appointment to obtain it. Simply go to our office during business hours (Monday-Thursday from 8:00 AM to 4:00 PM) (Friday 8:00 AM to 12:00 Noon) or if you have a scheduled appointment with us, prior to your surgery, and ask for it by name. In addition, you are responsible for: calling our office [phone], at any time of the day or night, and leaving a message stating your name, name of your surgeon, type of surgery, and date of procedure or surgery. Failure to comply with your responsibilities may result in termination of therapy involving the controlled substances.  *Opioid medications include: morphine, codeine, oxycodone, oxymorphone, hydrocodone, hydromorphone, meperidine, tramadol, tapentadol, buprenorphine, fentanyl, methadone. **Benzodiazepine medications include: diazepam (Valium), alprazolam (Xanax), clonazepam (Klonopine), lorazepam (Ativan), clorazepate (Tranxene), chlordiazepoxide (Librium), estazolam (Prosom), oxazepam (Serax), temazepam (Restoril), triazolam (Halcion) (Last updated: 12/29/2019) ____________________________________________________________________________________________  ____________________________________________________________________________________________  Medication Recommendations and Reminders  Applies to: All patients receiving prescriptions (written and/or electronic).  Medication Rules & Regulations: These rules and regulations exist for your safety and that of others. They are not flexible and neither are we. Dismissing or ignoring them will be considered "non-compliance" with medication therapy, resulting in complete and irreversible termination of such therapy. (See document titled "Medication Rules" for more details.) In all conscience, because of safety reasons, we cannot continue providing a therapy where the patient does not follow instructions.  Pharmacy of record:  Definition: This is the pharmacy where your electronic  prescriptions will be sent.  We do not endorse any particular pharmacy, however, we have experienced problems with [facility] not securing enough medication supply for the community. We do not restrict you in your choice of pharmacy. However, once we write for your prescriptions, we will NOT be re-sending more prescriptions to fix restricted supply problems   created by your pharmacy, or your insurance.  The pharmacy listed in the electronic medical record should be the one where you want electronic prescriptions to be sent. If you choose to change pharmacy, simply notify our nursing staff.  Recommendations: Keep all of your pain medications in a safe place, under lock and key, even if you live alone. We will NOT replace lost, stolen, or damaged medication. After you fill your prescription, take 1 week's worth of pills and put them away in a safe place. You should keep a separate, properly labeled bottle for this purpose. The remainder should be kept in the original bottle. Use this as your primary supply, until it runs out. Once it's gone, then you know that you have 1 week's worth of medicine, and it is time to come in for a prescription refill. If you do this correctly, it is unlikely that you will ever run out of medicine. To make sure that the above recommendation works, it is very important that you make sure your medication refill appointments are scheduled at least 1 week before you run out of medicine. To do this in an effective manner, make sure that you do not leave the office without scheduling your next medication management appointment. Always ask the nursing staff to show you in your prescription , when your medication will be running out. Then arrange for the receptionist to get you a return appointment, at least 7 days before you run out of medicine. Do not wait until you have 1 or 2 pills left, to come in. This is very poor planning and does not take into consideration that we may need to  cancel appointments due to bad weather, sickness, or emergencies affecting our staff. DO NOT ACCEPT A "Partial Fill": If for any reason your pharmacy does not have enough pills/tablets to completely fill or refill your prescription, do not allow for a "partial fill". The law allows the pharmacy to complete that prescription within 72 hours, without requiring a new prescription. If they do not fill the rest of your prescription within those 72 hours, you will need a separate prescription to fill the remaining amount, which we will NOT provide. If the reason for the partial fill is your insurance, you will need to talk to the pharmacist about payment alternatives for the remaining tablets, but again, DO NOT ACCEPT A PARTIAL FILL, unless you can trust your pharmacist to obtain the remainder of the pills within 72 hours.  Prescription refills and/or changes in medication(s):  Prescription refills, and/or changes in dose or medication, will be conducted only during scheduled medication management appointments. (Applies to both, written and electronic prescriptions.) No refills on procedure days. No medication will be changed or started on procedure days. No changes, adjustments, and/or refills will be conducted on a procedure day. Doing so will interfere with the diagnostic portion of the procedure. No phone refills. No medications will be "called into the pharmacy". No Fax refills. No weekend refills. No Holliday refills. No after hours refills.  Remember:  Business hours are:  Monday to Thursday 8:00 AM to 4:00 PM Provider's Schedule: Lonzie Simmer, MD - Appointments are:  Medication management: Monday and Wednesday 8:00 AM to 4:00 PM Procedure day: Tuesday and Thursday 7:30 AM to 4:00 PM Bilal Lateef, MD - Appointments are:  Medication management: Tuesday and Thursday 8:00 AM to 4:00 PM Procedure day: Monday and Wednesday 7:30 AM to 4:00 PM (Last update:  08/20/2019) ____________________________________________________________________________________________  ____________________________________________________________________________________________  CBD (cannabidiol) WARNING    Applicable to: All individuals currently taking or considering taking CBD (cannabidiol) and, more important, all patients taking opioid analgesic controlled substances (pain medication). (Example: oxycodone; oxymorphone; hydrocodone; hydromorphone; morphine; methadone; tramadol; tapentadol; fentanyl; buprenorphine; butorphanol; dextromethorphan; meperidine; codeine; etc.)  Legal status: CBD remains a Schedule I drug prohibited for any use. CBD is illegal with one exception. In the [place], CBD has a limited Food and Drug Administration (FDA) approval for the treatment of two specific types of epilepsy disorders. Only one CBD product has been approved by the FDA for this purpose: "Epidiolex". FDA is aware that some companies are marketing products containing cannabis and cannabis-derived compounds in ways that violate the Federal Food, Drug and Cosmetic Act (FD&C Act) and that may put the health and safety of consumers at risk. The FDA, a Federal agency, has not enforced the CBD status since 2018.   Legality: Some manufacturers ship CBD products nationally, which is illegal. Often such products are sold online and are therefore available throughout the country. CBD is openly sold in head shops and health food stores in some states where such sales have not been explicitly legalized. Selling unapproved products with unsubstantiated therapeutic claims is not only a violation of the law, but also can put patients at risk, as these products have not been proven to be safe or effective. Federal illegality makes it difficult to conduct research on CBD.  Reference: "FDA Regulation of Cannabis and Cannabis-Derived Products, Including Cannabidiol (CBD)" -  https://www.fda.gov/news-events/public-health-focus/fda-regulation-cannabis-and-cannabis-derived-products-including-cannabidiol-cbd  Warning: CBD is not FDA approved and has not undergo the same manufacturing controls as prescription drugs.  This means that the purity and safety of available CBD may be questionable. Most of the time, despite manufacturer's claims, it is contaminated with THC (delta-9-tetrahydrocannabinol - the chemical in marijuana responsible for the "HIGH").  When this is the case, the THC contaminant will trigger a positive urine drug screen (UDS) test for Marijuana (carboxy-THC). Because a positive UDS for any illicit substance is a violation of our medication agreement, your opioid analgesics (pain medicine) may be permanently discontinued.  MORE ABOUT CBD  General Information: CBD  is a derivative of the Marijuana (cannabis sativa) plant discovered in 1940. It is one of the 113 identified substances found in Marijuana. It accounts for up to 40% of the plant's extract. As of 2018, preliminary clinical studies on CBD included research for the treatment of anxiety, movement disorders, and pain. CBD is available and consumed in multiple forms, including inhalation of smoke or vapor, as an aerosol spray, and by mouth. It may be supplied as an oil containing CBD, capsules, dried cannabis, or as a liquid solution. CBD is thought not to be as psychoactive as THC (delta-9-tetrahydrocannabinol - the chemical in marijuana responsible for the "HIGH"). Studies suggest that CBD may interact with different biological target receptors in the body, including cannabinoid and other neurotransmitter receptors. As of 2018 the mechanism of action for its biological effects has not been determined.  Side-effects  Adverse reactions: Dry mouth, diarrhea, decreased appetite, fatigue, drowsiness, malaise, weakness, sleep disturbances, and others.  Drug interactions: CBC may interact with other medications  such as blood-thinners. (Last update: 09/06/2019) ____________________________________________________________________________________________  ____________________________________________________________________________________________  Drug Holidays (Slow)  What is a "Drug Holiday"? Drug Holiday: is the name given to the period of time during which a patient stops taking a medication(s) for the purpose of eliminating tolerance to the drug.  Benefits Improved effectiveness of opioids. Decreased opioid dose needed to achieve benefits. Improved pain with lesser dose.    What is tolerance? Tolerance: is the progressive decreased in effectiveness of a drug due to its repetitive use. With repetitive use, the body gets use to the medication and as a consequence, it loses its effectiveness. This is a common problem seen with opioid pain medications. As a result, a larger dose of the drug is needed to achieve the same effect that used to be obtained with a smaller dose.  How long should a "Drug Holiday" last? You should stay off of the pain medicine for at least 14 consecutive days. (2 weeks)  Should I stop the medicine "cold [place]"? No. You should always coordinate with your Pain Specialist so that he/she can provide you with the correct medication dose to make the transition as smoothly as possible.  How do I stop the medicine? Slowly. You will be instructed to decrease the daily amount of pills that you take by one (1) pill every seven (7) days. This is called a "slow downward taper" of your dose. For example: if you normally take four (4) pills per day, you will be asked to drop this dose to three (3) pills per day for seven (7) days, then to two (2) pills per day for seven (7) days, then to one (1) per day for seven (7) days, and at the end of those last seven (7) days, this is when the "Drug Holiday" would start.   Will I have withdrawals? By doing a "slow downward taper" like this one, it  is unlikely that you will experience any significant withdrawal symptoms. Typically, what triggers withdrawals is the sudden stop of a high dose opioid therapy. Withdrawals can usually be avoided by slowly decreasing the dose over a prolonged period of time. If you do not follow these instructions and decide to stop your medication abruptly, withdrawals may be possible.  What are withdrawals? Withdrawals: refers to the wide range of symptoms that occur after stopping or dramatically reducing opiate drugs after heavy and prolonged use. Withdrawal symptoms do not occur to patients that use low dose opioids, or those who take the medication sporadically. Contrary to benzodiazepine (example: Valium, Xanax, etc.) or alcohol withdrawals ("Delirium Tremens"), opioid withdrawals are not lethal. Withdrawals are the physical manifestation of the body getting rid of the excess receptors.  Expected Symptoms Early symptoms of withdrawal may include: Agitation Anxiety Muscle aches Increased tearing Insomnia Runny nose Sweating Yawning  Late symptoms of withdrawal may include: Abdominal cramping Diarrhea Dilated pupils Goose bumps Nausea Vomiting  Will I experience withdrawals? Due to the slow nature of the taper, it is very unlikely that you will experience any.  What is a slow taper? Taper: refers to the gradual decrease in dose.  (Last update: 08/20/2019) ____________________________________________________________________________________________   ____________________________________________________________________________________________  Blood Thinners  IMPORTANT NOTICE:  If you take any of these, make sure to notify the nursing staff.  Failure to do so may result in injury.  Recommended time intervals to stop and restart blood-thinners, before & after invasive procedures  Generic Name Brand Name Pre-procedure. Stop this long before procedure. Post-procedure. Minimum waiting period  before restarting.  Abciximab Reopro 15 days 2 hrs  Alteplase Activase 10 days 10 days  Anagrelide Agrylin    Apixaban Eliquis 3 days 6 hrs  Cilostazol Pletal 3 days 5 hrs  Clopidogrel Plavix 7-10 days 2 hrs  Dabigatran Pradaxa 5 days 6 hrs  Dalteparin Fragmin 24 hours 4 hrs  Dipyridamole Aggrenox 11days 2 hrs  Edoxaban Lixiana; Savaysa 3 days 2 hrs  Enoxaparin  Lovenox 24 hours 4 hrs  Eptifibatide Integrillin 8 hours 2 hrs  Fondaparinux  Arixtra 72 hours 12 hrs  Hydroxychloroquine Plaquenil 11 days   Prasugrel Effient 7-10 days 6 hrs  Reteplase Retavase 10 days 10 days  Rivaroxaban Xarelto 3 days 6 hrs  Ticagrelor Brilinta 5-7 days 6 hrs  Ticlopidine Ticlid 10-14 days 2 hrs  Tinzaparin Innohep 24 hours 4 hrs  Tirofiban Aggrastat 8 hours 2 hrs  Warfarin Coumadin 5 days 2 hrs   Other medications with blood-thinning effects  Product indications Generic (Brand) names Note  Cholesterol Lipitor Stop 4 days before procedure  Blood thinner (injectable) Heparin (LMW or LMWH Heparin) Stop 24 hours before procedure  Cancer Ibrutinib (Imbruvica) Stop 7 days before procedure  Malaria/Rheumatoid Hydroxychloroquine (Plaquenil) Stop 11 days before procedure  Thrombolytics  10 days before or after procedures   Over-the-counter (OTC) Products with blood-thinning effects  Product Common names Stop Time  Aspirin > 325 mg Goody Powders, Excedrin, etc. 11 days  Aspirin ? 81 mg  7 days  Fish oil  4 days  Garlic supplements  7 days  Ginkgo biloba  36 hours  Ginseng  24 hours  NSAIDs Ibuprofen, Naprosyn, etc. 3 days  Vitamin E  4 days   ____________________________________________________________________________________________

## 2020-09-15 NOTE — Progress Notes (Signed)
Nursing Pain Medication Assessment:  Safety precautions to be maintained throughout the outpatient stay will include: orient to surroundings, keep bed in low position, maintain call bell within reach at all times, provide assistance with transfer out of bed and ambulation.  Medication Inspection Compliance: Pill count conducted under aseptic conditions, in front of the patient. Neither the pills nor the bottle was removed from the patient's sight at any time. Once count was completed pills were immediately returned to the patient in their original bottle.  Medication: See above Pill/Patch Count:  65 of 105 pills remain Pill/Patch Appearance: Markings consistent with prescribed medication Bottle Appearance: Standard pharmacy container. Clearly labeled. Filled Date: 7 / 23 / 2022 Last Medication intake:  Today Safety precautions to be maintained throughout the outpatient stay will include: orient to surroundings, keep bed in low position, maintain call bell within reach at all times, provide assistance with transfer out of bed and ambulation.

## 2020-09-22 ENCOUNTER — Ambulatory Visit (INDEPENDENT_AMBULATORY_CARE_PROVIDER_SITE_OTHER): Payer: Medicare Other

## 2020-09-22 ENCOUNTER — Other Ambulatory Visit: Payer: Self-pay

## 2020-09-22 DIAGNOSIS — I482 Chronic atrial fibrillation, unspecified: Secondary | ICD-10-CM | POA: Diagnosis not present

## 2020-09-22 LAB — POCT INR
INR: 3.8 — AB (ref 2.0–3.0)
Prothrombin Time: 46

## 2020-09-22 NOTE — Patient Instructions (Signed)
Description   Hold until epidural scheduled on 09/28/20, once cleared by pain management patient is to restart back at '3mg'$  daily except Monday, Wednesday and Friday take '4mg'$ .

## 2020-09-23 NOTE — Telephone Encounter (Signed)
error 

## 2020-09-24 ENCOUNTER — Telehealth: Payer: Self-pay | Admitting: Family Medicine

## 2020-09-24 ENCOUNTER — Other Ambulatory Visit: Payer: Self-pay | Admitting: Family Medicine

## 2020-09-24 MED ORDER — NONFORMULARY OR COMPOUNDED ITEM: 3 refills | Status: DC

## 2020-09-24 NOTE — Telephone Encounter (Signed)
Medication Refill - Medication: nonformulary or compounded medication  Has the patient contacted their pharmacy? No.. Pt said she gave the list to lady when she was in the office 09-08-2020   Preferred Pharmacy (with phone number or street name): [facility] in [place] phone number [phone]  Agent: Please be advised that RX refills may take up to 3 business days. We ask that you follow-up with your pharmacy.

## 2020-09-24 NOTE — Telephone Encounter (Signed)
Requested medication (s) are due for refill today: Yes  Requested medication (s) are on the active medication list: Yes  Last refill:  04/2020  Future visit scheduled: Yes  Notes to clinic:  Unable to refill per protocol, not assigned to protocol     Requested Prescriptions  Pending Prescriptions Disp Refills   NONFORMULARY OR COMPOUNDED ITEM 120 each 3    Sig: See pharmacy note- Not to use on any open skin or wound.     There is no refill protocol information for this order

## 2020-09-24 NOTE — Telephone Encounter (Signed)
Requested medication (s) are due for refill today: yes  Requested medication (s) are on the active medication list: yes  Last refill:  07/08/20 #60 1 refill  Future visit scheduled: yes in 2 months  Notes to clinic:  not delegated per protocol     Requested Prescriptions  Pending Prescriptions Disp Refills   LORazepam (ATIVAN) 1 MG tablet [Pharmacy Med Name: LORAZEPAM 1 MG TABLET] 60 tablet 1    Sig: TAKE 1 TABLET BY MOUTH TWICE A DAY AS NEEDED     Not Delegated - Psychiatry:  Anxiolytics/Hypnotics Failed - 09/24/2020  9:28 AM      Failed - This refill cannot be delegated      Failed - Urine Drug Screen completed in last 360 days      Passed - Valid encounter within last 6 months    Recent Outpatient Visits           2 weeks ago [place] Jerrol Banana., MD   1 month ago Chronic combined systolic and diastolic congestive heart failure [facility])   [facility] Jerrol Banana., MD   4 months ago Urinary frequency   [place], Laurita Quint, FNP   5 months ago Urinary tract infection with hematuria, site unspecified   [facility] [place] Birdie Sons, MD   7 months ago [place] and fatigue   [place], PA-C       Future Appointments             In 1 week [name], Kathlene November, MD [facility]   In 2 weeks Bjorn Loser, MD [place]   In 2 months Jerrol Banana., MD [facility], [place]

## 2020-09-27 NOTE — Progress Notes (Signed)
PROVIDER NOTE: Information contained herein reflects review and annotations entered in association with encounter. Interpretation of such information and data should be left to medically-trained personnel. Information provided to patient can be located elsewhere in the medical record under "Patient Instructions". Document created using STT-dictation technology, any transcriptional errors that may result from process are unintentional.    Patient: Tracy Mann  Service Category: Procedure  Provider: Gaspar Cola, MD  DOB: 1934-01-25  DOS: 09/28/2020  Location: [facility]  MRN: [id number]  Setting: Ambulatory - outpatient  Referring Provider: Jerrol Banana.,*  Type: Established Patient  Specialty: Interventional Pain Management  PCP: Jerrol Banana., MD   Primary Reason for Visit: Interventional Pain Management Treatment. CC: Hip Pain (Bilateral, left is worse) and Leg Pain (Bilateral heaviness)    Procedure:          Anesthesia, Analgesia, Anxiolysis:  Type: Therapeutic Inter-Laminar Epidural Steroid Injection           Region: Lumbar Level: L4-5 Level. Laterality: Midline         Type: Local Anesthesia Indication(s): Analgesia         Route: Infiltration ([place]/IM) IV Access: Declined Sedation: Declined  Local Anesthetic: Lidocaine 1-2%  Position: Prone with head of the table was raised to facilitate breathing.   Indications: 1. Lumbar central spinal stenosis (SEVERE) (L3-4) with neurogenic claudication   2. DDD (degenerative disc disease), lumbosacral   3. Lumbosacral radiculopathy at L5 (Bilateral)   4. Grade 1 Anterolisthesis of lumbar spine (L4/L5)   5. Lumbar lateral recess stenosis (L4-5) (Right)   6. Lumbosacral spondylosis with radiculopathy   7. Chronic lower extremity pain (1ry area of Pain) (Bilateral)   8. Chronic low back pain (3ry area of Pain) (Bilateral) w/ sciatica (Bilateral)   9. Chronic hip pain (2ry area of Pain) (Bilateral)  (R>L)   10. Encounter for therapeutic procedure   11. Chronic anticoagulation (Coumadin)    Pain Score: Pre-procedure: 5 /10 Post-procedure: 0-No pain/10     Pre-op H&P Assessment:  Tracy Mann is a 87 y.o. (year old), female patient, seen today for interventional treatment. She  has a past surgical history that includes Vesicovaginal fistula closure w/ TAH (1996); Tubal ligation (1968); Cholecystectomy (1999); and Cardiac catheterization (11/2012). Tracy Mann has a current medication list which includes the following prescription(s): accu-chek aviva plus, albuterol, carvedilol, estradiol, ezetimibe, flavoxate, fluticasone, hydrocodone-acetaminophen, [START ON 10/20/2020] hydrocodone-acetaminophen, [START ON 11/19/2020] hydrocodone-acetaminophen, lantus solostar, insulin lispro, relion pen needle 31g/63m, levothyroxine, lorazepam, losartan, magnesium, metolazone, metoprolol tartrate, mupirocin ointment, nitroglycerin, NONFORMULARY OR COMPOUNDED ITEM, phenazopyridine, potassium chloride sa, torsemide, warfarin, and [DISCONTINUED] ropinirole. Her primarily concern today is the Hip Pain (Bilateral, left is worse) and Leg Pain (Bilateral heaviness)  Initial Vital Signs:  Pulse/HCG Rate: 74ECG Heart Rate: 60 Temp: (!) 96.8 F (36 C) Resp: 18 BP: 109/64 SpO2: 92 %  BMI: Estimated body mass index is 33.41 kg/m as calculated from the following:   Height as of this encounter: '5\' 6"'$  (1.676 m).   Weight as of this encounter: 207 lb (93.9 kg).  Risk Assessment: Allergies: Reviewed. She is allergic to duloxetine; other; paroxetine; sulfa antibiotics; cephalexin; clarithromycin; diphenhydramine; estrogens conjugated; estrogens, conjugated; sulfonamide derivatives; and nitrofurantoin.  Allergy Precautions: None required Coagulopathies: Reviewed. None identified.  Blood-thinner therapy: None at this time Active Infection(s): Reviewed. None identified. Tracy Mann afebrile  Site Confirmation: Tracy Mann  asked to confirm the procedure and laterality before marking the site Procedure checklist: Completed  Consent: Before the procedure and under the influence of no sedative(s), amnesic(s), or anxiolytics, the patient was informed of the treatment options, risks and possible complications. To fulfill our ethical and legal obligations, as recommended by the American Medical Association's Code of Ethics, I have informed the patient of my clinical impression; the nature and purpose of the treatment or procedure; the risks, benefits, and possible complications of the intervention; the alternatives, including doing nothing; the risk(s) and benefit(s) of the alternative treatment(s) or procedure(s); and the risk(s) and benefit(s) of doing nothing. The patient was provided information about the general risks and possible complications associated with the procedure. These may include, but are not limited to: failure to achieve desired goals, infection, bleeding, organ or nerve damage, allergic reactions, paralysis, and death. In addition, the patient was informed of those risks and complications associated to Spine-related procedures, such as failure to decrease pain; infection (i.e.: Meningitis, epidural or intraspinal abscess); bleeding (i.e.: epidural hematoma, subarachnoid hemorrhage, or any other type of intraspinal or peri-dural bleeding); organ or nerve damage (i.e.: Any type of peripheral nerve, nerve root, or spinal cord injury) with subsequent damage to sensory, motor, and/or autonomic systems, resulting in permanent pain, numbness, and/or weakness of one or several areas of the body; allergic reactions; (i.e.: anaphylactic reaction); and/or death. Furthermore, the patient was informed of those risks and complications associated with the medications. These include, but are not limited to: allergic reactions (i.e.: anaphylactic or anaphylactoid reaction(s)); adrenal axis suppression; blood sugar elevation that in  diabetics may result in ketoacidosis or comma; water retention that in patients with history of congestive heart failure may result in shortness of breath, pulmonary edema, and decompensation with resultant heart failure; weight gain; swelling or edema; medication-induced neural toxicity; particulate matter embolism and blood vessel occlusion with resultant organ, and/or nervous system infarction; and/or aseptic necrosis of one or more joints. Finally, the patient was informed that Medicine is not an exact science; therefore, there is also the possibility of unforeseen or unpredictable risks and/or possible complications that may result in a catastrophic outcome. The patient indicated having understood very clearly. We have given the patient no guarantees and we have made no promises. Enough time was given to the patient to ask questions, all of which were answered to the patient's satisfaction. Ms. Priestley has indicated that she wanted to continue with the procedure. Attestation: I, the ordering provider, attest that I have discussed with the patient the benefits, risks, side-effects, alternatives, likelihood of achieving goals, and potential problems during recovery for the procedure that I have provided informed consent. Date  Time: 09/28/2020  9:17 AM  Pre-Procedure Preparation:  Monitoring: As per clinic protocol. Respiration, ETCO2, SpO2, BP, heart rate and rhythm monitor placed and checked for adequate function Safety Precautions: Patient was assessed for positional comfort and pressure points before starting the procedure. Time-out: I initiated and conducted the "Time-out" before starting the procedure, as per protocol. The patient was asked to participate by confirming the accuracy of the "Time Out" information. Verification of the correct person, site, and procedure were performed and confirmed by me, the nursing staff, and the patient. "Time-out" conducted as per Joint Commission's Universal Protocol  (UP.01.01.01). Time: [id number]  Description of Procedure:          Target Area: The interlaminar space, initially targeting the lower laminar border of the superior vertebral body. Approach: Paramedial approach. Area Prepped: Entire Posterior Lumbar Region DuraPrep (Iodine Povacrylex [0.7% available iodine] and Isopropyl Alcohol, 74% w/w) Safety Precautions:  Aspiration looking for blood return was conducted prior to all injections. At no point did we inject any substances, as a needle was being advanced. No attempts were made at seeking any paresthesias. Safe injection practices and needle disposal techniques used. Medications properly checked for expiration dates. SDV (single dose vial) medications used. Description of the Procedure: Protocol guidelines were followed. The procedure needle was introduced through the skin, ipsilateral to the reported pain, and advanced to the target area. Bone was contacted and the needle walked caudad, until the lamina was cleared. The epidural space was identified using "loss-of-resistance technique" with 2-3 ml of PF-NaCl (0.9% NSS), in a 5cc LOR glass syringe.  Vitals:   09/28/20 0915 09/28/20 0950 09/28/20 0955 09/28/20 1000  BP: 109/64 (!) 131/57 118/62 110/77  Pulse: 74     Resp:  '18 18 16  '$ Temp: (!) 96.8 F (36 C)     SpO2: 92% 100% 100% 96%  Weight: 207 lb (93.9 kg)     Height: '5\' 6"'$  (1.676 m)       Start Time: 0954 hrs. End Time: 0959 hrs.  Materials:  Needle(s) Type: Epidural needle Gauge: 17G Length: 3.5-in Medication(s): Please see orders for medications and dosing details.  Imaging Guidance (Spinal):          Type of Imaging Technique: Fluoroscopy Guidance (Spinal) Indication(s): Assistance in needle guidance and placement for procedures requiring needle placement in or near specific anatomical locations not easily accessible without such assistance. Exposure Time: Please see nurses notes. Contrast: Before injecting any contrast, we  confirmed that the patient did not have an allergy to iodine, shellfish, or radiological contrast. Once satisfactory needle placement was completed at the desired level, radiological contrast was injected. Contrast injected under live fluoroscopy. No contrast complications. See chart for type and volume of contrast used. Fluoroscopic Guidance: I was personally present during the use of fluoroscopy. "Tunnel Vision Technique" used to obtain the best possible view of the target area. Parallax error corrected before commencing the procedure. "Direction-depth-direction" technique used to introduce the needle under continuous pulsed fluoroscopy. Once target was reached, antero-posterior, oblique, and lateral fluoroscopic projection used confirm needle placement in all planes. Images permanently stored in EMR. Interpretation: I personally interpreted the imaging intraoperatively. Adequate needle placement confirmed in multiple planes. Appropriate spread of contrast into desired area was observed. No evidence of afferent or efferent intravascular uptake. No intrathecal or subarachnoid spread observed. Permanent images saved into the patient's record.  Antibiotic Prophylaxis:   Anti-infectives (From admission, onward)    None      Indication(s): None identified  Post-operative Assessment:  Post-procedure Vital Signs:  Pulse/HCG Rate: 7474 Temp: (!) 96.8 F (36 C) Resp: 16 BP: 110/77 SpO2: 96 %  EBL: None  Complications: No immediate post-treatment complications observed by team, or reported by patient.  Note: The patient tolerated the entire procedure well. A repeat set of vitals were taken after the procedure and the patient was kept under observation following institutional policy, for this type of procedure. Post-procedural neurological assessment was performed, showing return to baseline, prior to discharge. The patient was provided with post-procedure discharge instructions, including a  section on how to identify potential problems. Should any problems arise concerning this procedure, the patient was given instructions to immediately contact us, at any time, without hesitation. In any case, we plan to contact the patient by telephone for a follow-up status report regarding this interventional procedure.  Comments:  No additional relevant information.  Plan of Care  Orders:  Orders Placed This Encounter  Procedures   Lumbar Epidural Injection    Scheduling Instructions:     Procedure: Interlaminar LESI L4-5     Laterality: Midline     Sedation: Patient's choice     Timeframe:  Today    Order Specific Question:   Where will this procedure be performed?    Answer:   [facility]   DG PAIN [facility] C-ARM 1-60 MIN NO REPORT    Intraoperative interpretation by procedural physician at [place].    Standing Status:   Standing    Number of Occurrences:   1    Order Specific Question:   Reason for exam:    Answer:   Assistance in needle guidance and placement for procedures requiring needle placement in or near specific anatomical locations not easily accessible without such assistance.   Informed Consent Details: Physician/Practitioner Attestation; Transcribe to consent form and obtain patient signature    Note: Always confirm laterality of pain with Ms. Jablonowski, before procedure. Transcribe to consent form and obtain patient signature.    Order Specific Question:   Physician/Practitioner attestation of informed consent for procedure/surgical case    Answer:   I, the physician/practitioner, attest that I have discussed with the patient the benefits, risks, side effects, alternatives, likelihood of achieving goals and potential problems during recovery for the procedure that I have provided informed consent.    Order Specific Question:   Procedure    Answer:   Lumbar epidural steroid injection under fluoroscopic guidance    Order Specific Question:    Physician/Practitioner performing the procedure    Answer:   Drucella Karbowski A. Dossie Arbour, MD    Order Specific Question:   Indication/Reason    Answer:   Low back and/or lower extremity pain secondary to lumbar radiculitis   Care order/instruction: Please confirm that the patient has stopped the Coumadin (Warfarin) X 5 days prior to procedure or surgery.    Please confirm that the patient has stopped the Coumadin (Warfarin) X 5 days prior to procedure or surgery.    Standing Status:   Standing    Number of Occurrences:   1   Provide equipment / supplies at bedside    "Epidural Tray" (Disposable  single use) Catheter: NOT required    Standing Status:   Standing    Number of Occurrences:   1    Order Specific Question:   Specify    Answer:   Epidural Tray   Bleeding precautions    Standing Status:   Standing    Number of Occurrences:   1    Chronic Opioid Analgesic:  Hydrocodone/APAP 10/325, 1 tab p.o. 4 times daily (last filled on 11/12/2019) MME/day: 40 mg/day   Medications ordered for procedure: Meds ordered this encounter  Medications   iohexol (OMNIPAQUE) 180 MG/ML injection 10 mL    Must be Myelogram-compatible. If not available, you may substitute with a water-soluble, non-ionic, hypoallergenic, myelogram-compatible radiological contrast medium.   lidocaine (XYLOCAINE) 2 % (with pres) injection 400 mg   DISCONTD: lactated ringers infusion 1,000 mL   DISCONTD: midazolam (VERSED) 5 MG/5ML injection 0.5-2 mg    Make sure Flumazenil is available in the pyxis when using this medication. If oversedation occurs, administer 0.2 mg IV over 15 sec. If after 45 sec no response, administer 0.2 mg again over 1 min; may repeat at 1 min intervals; not to exceed 4 doses (1 mg)   sodium chloride flush (NS) 0.9 % injection  2 mL   ropivacaine (PF) 2 mg/mL (0.2%) (NAROPIN) injection 2 mL   triamcinolone acetonide (KENALOG-40) injection 40 mg    Medications administered: We administered iohexol,  lidocaine, sodium chloride flush, ropivacaine (PF) 2 mg/mL (0.2%), and triamcinolone acetonide.  See the medical record for exact dosing, route, and time of administration.  Follow-up plan:   Return in about 2 weeks (around 10/12/2020) for Proc-day(T,Th), (F2F), (PPE).       Interventional Therapies  Risk  Complexity Considerations:   Estimated body mass index is 35.73 kg/m as calculated from the following:   Height as of this encounter: 5' 5.5" (1.664 m).   Weight as of this encounter: 218 lb (98.9 kg). NOTE: COUMADIN Anticoagulation (Stop: 5 days  Restart: 2 hours) Decreased GFR   Planned  Pending:   Diagnostic bilateral L3 TFESI #1  Diagnostic right L4 TFESI #1  Diagnostic right L3-4 LESI #1  Diagnostic right L4 TFESI #1  The patient will also need to have x-rays of her cervical and thoracic spine since there are no official reports from the [facility].   Under consideration:   Diagnostic bilateral L4 TFESI x1 (Done 09/12/2019 - Dr. Alba Destine [[facility]]) Diagnostic midline caudal ESI #1 + diagnostic epidurogram  Diagnostic bilateral IA hip joint injection #1  Diagnostic right L3-4 LESI #1  Diagnostic right L4 TFESI #1  Diagnostic bilateral L3 transforaminal ESI #1  Diagnostic bilateral lumbar facet block #1    Completed:   None at this time   Therapeutic  Palliative (PRN) options:   None established     Recent Visits Date Type Provider Dept  09/15/20 Office Visit Milinda Pointer, MD [facility]  Showing recent visits within past 90 days and meeting all other requirements Today's Visits Date Type Provider Dept  09/28/20 Procedure visit Milinda Pointer, MD [facility]  Showing today's visits and meeting all other requirements Future Appointments Date Type Provider Dept  10/14/20 Appointment Milinda Pointer, MD [facility]  12/20/20 Appointment Milinda Pointer, MD [facility]  Showing future appointments within next  90 days and meeting all other requirements Disposition: Discharge home  Discharge (Date  Time): 09/28/2020; 1010 hrs.   Primary Care Physician: Jerrol Banana., MD Location: [facility] Note by: Gaspar Cola, MD Date: 09/28/2020; Time: 12:32 PM  Disclaimer:  Medicine is not an Chief Strategy Officer. The only guarantee in medicine is that nothing is guaranteed. It is important to note that the decision to proceed with this intervention was based on the information collected from the patient. The Data and conclusions were drawn from the patient's questionnaire, the interview, and the physical examination. Because the information was provided in large part by the patient, it cannot be guaranteed that it has not been purposely or unconsciously manipulated. Every effort has been made to obtain as much relevant data as possible for this evaluation. It is important to note that the conclusions that lead to this procedure are derived in large part from the available data. Always take into account that the treatment will also be dependent on availability of resources and existing treatment guidelines, considered by other Pain Management Practitioners as being common knowledge and practice, at the time of the intervention. For Medico-Legal purposes, it is also important to point out that variation in procedural techniques and pharmacological choices are the acceptable norm. The indications, contraindications, technique, and results of the above procedure should only be interpreted and judged by a Board-Certified Interventional Pain  Specialist with extensive familiarity and expertise in the same exact procedure and technique.

## 2020-09-28 ENCOUNTER — Encounter: Payer: Self-pay | Admitting: Pain Medicine

## 2020-09-28 ENCOUNTER — Other Ambulatory Visit: Payer: Self-pay

## 2020-09-28 ENCOUNTER — Ambulatory Visit
Admission: RE | Admit: 2020-09-28 | Discharge: 2020-09-28 | Disposition: A | Discharge status: Home / Self Care | Payer: Medicare Other | Source: Ambulatory Visit | Attending: Pain Medicine | Admitting: Pain Medicine

## 2020-09-28 ENCOUNTER — Ambulatory Visit (HOSPITAL_BASED_OUTPATIENT_CLINIC_OR_DEPARTMENT_OTHER): Payer: Medicare Other | Admitting: Pain Medicine

## 2020-09-28 VITALS — BP 110/77 | HR 74 | Temp 96.8°F | Resp 16 | Ht 66.0 in | Wt 207.0 lb

## 2020-09-28 DIAGNOSIS — M4727 Other spondylosis with radiculopathy, lumbosacral region: Secondary | ICD-10-CM

## 2020-09-28 DIAGNOSIS — Z5189 Encounter for other specified aftercare: Secondary | ICD-10-CM

## 2020-09-28 DIAGNOSIS — M5442 Lumbago with sciatica, left side: Secondary | ICD-10-CM | POA: Insufficient documentation

## 2020-09-28 DIAGNOSIS — M5137 Other intervertebral disc degeneration, lumbosacral region: Secondary | ICD-10-CM | POA: Insufficient documentation

## 2020-09-28 DIAGNOSIS — M25551 Pain in right hip: Secondary | ICD-10-CM | POA: Diagnosis not present

## 2020-09-28 DIAGNOSIS — M48061 Spinal stenosis, lumbar region without neurogenic claudication: Secondary | ICD-10-CM | POA: Diagnosis not present

## 2020-09-28 DIAGNOSIS — M4316 Spondylolisthesis, lumbar region: Secondary | ICD-10-CM | POA: Insufficient documentation

## 2020-09-28 DIAGNOSIS — M5441 Lumbago with sciatica, right side: Secondary | ICD-10-CM | POA: Insufficient documentation

## 2020-09-28 DIAGNOSIS — G8929 Other chronic pain: Secondary | ICD-10-CM | POA: Insufficient documentation

## 2020-09-28 DIAGNOSIS — M79605 Pain in left leg: Secondary | ICD-10-CM | POA: Diagnosis not present

## 2020-09-28 DIAGNOSIS — M25552 Pain in left hip: Secondary | ICD-10-CM | POA: Insufficient documentation

## 2020-09-28 DIAGNOSIS — M5417 Radiculopathy, lumbosacral region: Secondary | ICD-10-CM | POA: Diagnosis not present

## 2020-09-28 DIAGNOSIS — Z7901 Long term (current) use of anticoagulants: Secondary | ICD-10-CM

## 2020-09-28 DIAGNOSIS — M79604 Pain in right leg: Secondary | ICD-10-CM | POA: Insufficient documentation

## 2020-09-28 DIAGNOSIS — M48062 Spinal stenosis, lumbar region with neurogenic claudication: Secondary | ICD-10-CM | POA: Diagnosis not present

## 2020-09-28 MED ORDER — MIDAZOLAM HCL 5 MG/5ML IJ SOLN: 0.5000 mg | Freq: Once | INTRAMUSCULAR | Status: DC

## 2020-09-28 MED ORDER — TRIAMCINOLONE ACETONIDE 40 MG/ML IJ SUSP
40.0000 mg | Freq: Once | INTRAMUSCULAR | Status: AC
  Administered 2020-09-28: 40 mg
  Filled 2020-09-28: qty 1

## 2020-09-28 MED ORDER — SODIUM CHLORIDE 0.9% FLUSH
2.0000 mL | Freq: Once | INTRAVENOUS | Status: AC
Start: 2020-09-28 — End: 2020-09-28
  Administered 2020-09-28: 2 mL

## 2020-09-28 MED ORDER — LIDOCAINE HCL 2 % IJ SOLN
20.0000 mL | Freq: Once | INTRAMUSCULAR | Status: AC
Start: 2020-09-28 — End: 2020-09-28
  Administered 2020-09-28: 100 mg

## 2020-09-28 MED ORDER — LACTATED RINGERS IV SOLN: 1000.0000 mL | Freq: Once | INTRAVENOUS | Status: DC

## 2020-09-28 MED ORDER — IOHEXOL 180 MG/ML  SOLN
10.0000 mL | Freq: Once | INTRAMUSCULAR | Status: AC
  Administered 2020-09-28: 5 mL via EPIDURAL

## 2020-09-28 MED ORDER — ROPIVACAINE HCL 2 MG/ML IJ SOLN
2.0000 mL | Freq: Once | INTRAMUSCULAR | Status: AC
  Administered 2020-09-28: 2 mL via EPIDURAL

## 2020-09-28 NOTE — Progress Notes (Signed)
Safety precautions to be maintained throughout the outpatient stay will include: orient to surroundings, keep bed in low position, maintain call bell within reach at all times, provide assistance with transfer out of bed and ambulation.  

## 2020-09-28 NOTE — Patient Instructions (Addendum)
____________________________________________________________________________________________  Post-Procedure Discharge Instructions  Instructions: Apply ice:  Purpose: This will minimize any swelling and discomfort after procedure.  When: Day of procedure, as soon as you get home. How: Fill a plastic sandwich bag with crushed ice. Cover it with a small towel and apply to injection site. How long: (15 min on, 15 min off) Apply for 15 minutes then remove x 15 minutes.  Repeat sequence on day of procedure, until you go to bed. Apply heat:  Purpose: To treat any soreness and discomfort from the procedure. When: Starting the next day after the procedure. How: Apply heat to procedure site starting the day following the procedure. How long: May continue to repeat daily, until discomfort goes away. Food intake: Start with clear liquids (like water) and advance to regular food, as tolerated.  Physical activities: Keep activities to a minimum for the first 8 hours after the procedure. After that, then as tolerated. Driving: If you have received any sedation, be responsible and do not drive. You are not allowed to drive for 24 hours after having sedation. Blood thinner: (Applies only to those taking blood thinners) You may restart your blood thinner 6 hours after your procedure. Insulin: (Applies only to Diabetic patients taking insulin) As soon as you can eat, you may resume your normal dosing schedule. Infection prevention: Keep procedure site clean and dry. Shower daily and clean area with soap and water. Post-procedure Pain Diary: Extremely important that this be done correctly and accurately. Recorded information will be used to determine the next step in treatment. For the purpose of accuracy, follow these rules: Evaluate only the area treated. Do not report or include pain from an untreated area. For the purpose of this evaluation, ignore all other areas of pain, except for the treated area. After  your procedure, avoid taking a long nap and attempting to complete the pain diary after you wake up. Instead, set your alarm clock to go off every hour, on the hour, for the initial 8 hours after the procedure. Document the duration of the numbing medicine, and the relief you are getting from it. Do not go to sleep and attempt to complete it later. It will not be accurate. If you received sedation, it is likely that you were given a medication that may cause amnesia. Because of this, completing the diary at a later time may cause the information to be inaccurate. This information is needed to plan your care. Follow-up appointment: Keep your post-procedure follow-up evaluation appointment after the procedure (usually 2 weeks for most procedures, 6 weeks for radiofrequencies). DO NOT FORGET to bring you pain diary with you.   Expect: (What should I expect to see with my procedure?) From numbing medicine (AKA: Local Anesthetics): Numbness or decrease in pain. You may also experience some weakness, which if present, could last for the duration of the local anesthetic. Onset: Full effect within 15 minutes of injected. Duration: It will depend on the type of local anesthetic used. On the average, 1 to 8 hours.  From steroids (Applies only if steroids were used): Decrease in swelling or inflammation. Once inflammation is improved, relief of the pain will follow. Onset of benefits: Depends on the amount of swelling present. The more swelling, the longer it will take for the benefits to be seen. In some cases, up to 10 days. Duration: Steroids will stay in the system x 2 weeks. Duration of benefits will depend on multiple posibilities including persistent irritating factors. Side-effects: If present, they  may typically last 2 weeks (the duration of the steroids). Frequent: Cramps (if they occur, drink Gatorade and take over-the-counter Magnesium 450-500 mg once to twice a day); water retention with temporary  weight gain; increases in blood sugar; decreased immune system response; increased appetite. Occasional: Facial flushing (red, warm cheeks); mood swings; menstrual changes. Uncommon: Long-term decrease or suppression of natural hormones; bone thinning. (These are more common with higher doses or more frequent use. This is why we prefer that our patients avoid having any injection therapies in other practices.)  Very Rare: Severe mood changes; psychosis; aseptic necrosis. From procedure: Some discomfort is to be expected once the numbing medicine wears off. This should be minimal if ice and heat are applied as instructed.  Call if: (When should I call?) You experience numbness and weakness that gets worse with time, as opposed to wearing off. New onset bowel or bladder incontinence. (Applies only to procedures done in the spine)  Emergency Numbers: Durning business hours (Monday - Thursday, 8:00 AM - 4:00 PM) (Friday, 9:00 AM - 12:00 Noon): [phone] After hours: [phone] NOTE: If you are having a problem and are unable connect with, or to talk to a provider, then go to your nearest urgent care or emergency department. If the problem is serious and urgent, please call 911. ____________________________________________________________________________________________  ____________________________________________________________________________________________  Blood Thinners  IMPORTANT NOTICE:  If you take any of these, make sure to notify the nursing staff.  Failure to do so may result in injury.  Recommended time intervals to stop and restart blood-thinners, before & after invasive procedures  Generic Name Brand Name Pre-procedure. Stop this long before procedure. Post-procedure. Minimum waiting period before restarting.  Abciximab Reopro 15 days 2 hrs  Alteplase Activase 10 days 10 days  Anagrelide Agrylin    Apixaban Eliquis 3 days 6 hrs  Cilostazol Pletal 3 days 5 hrs   Clopidogrel Plavix 7-10 days 2 hrs  Dabigatran Pradaxa 5 days 6 hrs  Dalteparin Fragmin 24 hours 4 hrs  Dipyridamole Aggrenox 11days 2 hrs  Edoxaban Lixiana; Savaysa 3 days 2 hrs  Enoxaparin  Lovenox 24 hours 4 hrs  Eptifibatide Integrillin 8 hours 2 hrs  Fondaparinux  Arixtra 72 hours 12 hrs  Hydroxychloroquine Plaquenil 11 days   Prasugrel Effient 7-10 days 6 hrs  Reteplase Retavase 10 days 10 days  Rivaroxaban Xarelto 3 days 6 hrs  Ticagrelor Brilinta 5-7 days 6 hrs  Ticlopidine Ticlid 10-14 days 2 hrs  Tinzaparin Innohep 24 hours 4 hrs  Tirofiban Aggrastat 8 hours 2 hrs  Warfarin Coumadin 5 days 2 hrs   Other medications with blood-thinning effects  Product indications Generic (Brand) names Note  Cholesterol Lipitor Stop 4 days before procedure  Blood thinner (injectable) Heparin (LMW or LMWH Heparin) Stop 24 hours before procedure  Cancer Ibrutinib (Imbruvica) Stop 7 days before procedure  Malaria/Rheumatoid Hydroxychloroquine (Plaquenil) Stop 11 days before procedure  Thrombolytics  10 days before or after procedures   Over-the-counter (OTC) Products with blood-thinning effects  Product Common names Stop Time  Aspirin > 325 mg Goody Powders, Excedrin, etc. 11 days  Aspirin ? 81 mg  7 days  Fish oil  4 days  Garlic supplements  7 days  Ginkgo biloba  36 hours  Ginseng  24 hours  NSAIDs Ibuprofen, Naprosyn, etc. 3 days  Vitamin E  4 days   ____________________________________________________________________________________________ Pain Management Discharge Instructions  General Discharge Instructions :  If you need to reach your doctor call: Monday-Friday 8:00 am -  4:00 pm at [phone] or toll free [phone].  After clinic hours [phone] to have operator reach doctor.  Bring all of your medication bottles to all your appointments in the pain clinic.  To cancel or reschedule your appointment with Pain Management please remember to call 24 hours in advance  to avoid a fee.  Refer to the educational materials which you have been given on: General Risks, I had my Procedure. Discharge Instructions, Post Sedation.  Post Procedure Instructions:  The drugs you were given will stay in your system until tomorrow, so for the next 24 hours you should not drive, make any legal decisions or drink any alcoholic beverages.  You may eat anything you prefer, but it is better to start with liquids then soups and crackers, and gradually work up to solid foods.  Please notify your doctor immediately if you have any unusual bleeding, trouble breathing or pain that is not related to your normal pain.  Depending on the type of procedure that was done, some parts of your body may feel week and/or numb.  This usually clears up by tonight or the next day.  Walk with the use of an assistive device or accompanied by an adult for the 24 hours.  You may use ice on the affected area for the first 24 hours.  Put ice in a Ziploc bag and cover with a towel and place against area 15 minutes on 15 minutes off.  You may switch to heat after 24 hours.Epidural Steroid Injection Patient Information  Description: The epidural space surrounds the nerves as they exit the spinal cord.  In some patients, the nerves can be compressed and inflamed by a bulging disc or a tight spinal canal (spinal stenosis).  By injecting steroids into the epidural space, we can bring irritated nerves into direct contact with a potentially helpful medication.  These steroids act directly on the irritated nerves and can reduce swelling and inflammation which often leads to decreased pain.  Epidural steroids may be injected anywhere along the spine and from the neck to the low back depending upon the location of your pain.   After numbing the skin with local anesthetic (like Novocaine), a small needle is passed into the epidural space slowly.  You may experience a sensation of pressure while this is being done.   The entire block usually last less than 10 minutes.  Conditions which may be treated by epidural steroids:  Low back and leg pain Neck and arm pain Spinal stenosis Post-laminectomy syndrome Herpes zoster (shingles) pain Pain from compression fractures  Preparation for the injection:  Do not eat any solid food or dairy products within 8 hours of your appointment.  You may drink clear liquids up to 3 hours before appointment.  Clear liquids include water, black coffee, juice or soda.  No milk or cream please. You may take your regular medication, including pain medications, with a sip of water before your appointment  Diabetics should hold regular insulin (if taken separately) and take 1/2 normal NPH dos the morning of the procedure.  Carry some sugar containing items with you to your appointment. A driver must accompany you and be prepared to drive you home after your procedure.  Bring all your current medications with your. An IV may be inserted and sedation may be given at the discretion of the physician.   A blood pressure cuff, EKG and other monitors will often be applied during the procedure.  Some patients may need to  have extra oxygen administered for a short period. You will be asked to provide medical information, including your allergies, prior to the procedure.  We must know immediately if you are taking blood thinners (like Coumadin/Warfarin)  Or if you are allergic to IV iodine contrast (dye). We must know if you could possible be pregnant.  Possible side-effects: Bleeding from needle site Infection (rare, may require surgery) Nerve injury (rare) Numbness & tingling (temporary) Difficulty urinating (rare, temporary) Spinal headache ( a headache worse with upright posture) Light -headedness (temporary) Pain at injection site (several days) Decreased blood pressure (temporary) Weakness in arm/leg (temporary) Pressure sensation in back/neck (temporary)  Call if you  experience: Fever/chills associated with headache or increased back/neck pain. Headache worsened by an upright position. New onset weakness or numbness of an extremity below the injection site Hives or difficulty breathing (go to the emergency room) Inflammation or drainage at the infection site Severe back/neck pain Any new symptoms which are concerning to you  Please note:  Although the local anesthetic injected can often make your back or neck feel good for several hours after the injection, the pain will likely return.  It takes 3-7 days for steroids to work in the epidural space.  You may not notice any pain relief for at least that one week.  If effective, we will often do a series of three injections spaced 3-6 weeks apart to maximally decrease your pain.  After the initial series, we generally will wait several months before considering a repeat injection of the same type.  If you have any questions, please call [phone] [facility]

## 2020-09-29 ENCOUNTER — Telehealth: Payer: Self-pay

## 2020-09-29 NOTE — Telephone Encounter (Signed)
Post procedure phone call.  LM 

## 2020-09-30 ENCOUNTER — Telehealth: Payer: Self-pay

## 2020-09-30 ENCOUNTER — Ambulatory Visit: Payer: Self-pay | Admitting: *Deleted

## 2020-09-30 NOTE — Telephone Encounter (Signed)
Please advise 

## 2020-09-30 NOTE — Telephone Encounter (Signed)
Copied from [place] [id number]. Topic: Appointment Scheduling - Scheduling Inquiry for Clinic >> Sep 30, 2020  8:50 AM Oneta Rack wrote: Any cancellations please reach out to patient due to experiencing severe case of restless leg syndrome and was unable to sleep last night. Patient states PCP prescribe a medication a long time ago and can not remember the name of Rx. Patient would like a follow up call as soon as possible. (Practice first available appointment is not until 10/11/2020 )

## 2020-09-30 NOTE — Telephone Encounter (Signed)
Please advise? Patient is requesting a virtual visit?

## 2020-09-30 NOTE — Telephone Encounter (Signed)
Patient was advised.  

## 2020-09-30 NOTE — Telephone Encounter (Signed)
Patient is calling to report she had leg pains so bad last night that she about called EMS. Patient states she had epidural on Tuesday- she is not sure if this triggered the restless legs last night. Patient states she took lorazepam first and then after that didn't help- she took her narcotic pain medication. Patient states it has been a long time since she has had pain this bad - she was treated years ago- but can't remember what worked. She does state the gabapentin did not work well for her. Patient states she is afraid to try to sleep for fear this pain will come back. Patient request advice or virtual appointment with PCP. (Patient has reached out to her pain clinic- but her providers are not in office today)

## 2020-09-30 NOTE — Telephone Encounter (Signed)
Summary: Clinical Advice   Patient had a severe case of restless leg syndrome and was unable to sleep last night. Patient states PCP prescribe a medication a long time ago and can not remember the name of Rx. Patient would like a follow up call as soon as possible. (Practice first available appointment is not until 10/11/2020 sent a CRM to the practice for a possible work in. )      Reason for Disposition . [1] MODERATE pain (e.g., interferes with normal activities, limping) AND [2] present > 3 days    Pain that kept patient up last night- patient states restless leg syndrome.  Answer Assessment - Initial Assessment Questions 1. ONSET: "When did the pain start?"      Last night 2. LOCATION: "Where is the pain located?"      Both legs and feet 3. PAIN: "How bad is the pain?"    (Scale 1-10; or mild, moderate, severe)   -  MILD (1-3): doesn't interfere with normal activities    -  MODERATE (4-7): interferes with normal activities (e.g., work or school) or awakens from sleep, limping    -  SEVERE (8-10): excruciating pain, unable to do any normal activities, unable to walk     Moderate- patient did not sleep 4. WORK OR EXERCISE: "Has there been any recent work or exercise that involved this part of the body?"      no 5. CAUSE: "What do you think is causing the leg pain?"     Restless legs 6. OTHER SYMPTOMS: "Do you have any other symptoms?" (e.g., chest pain, back pain, breathing difficulty, swelling, rash, fever, numbness, weakness)     no 7. PREGNANCY: "Is there any chance you are pregnant?" "When was your last menstrual period?"     N/a  Protocols used: Leg Pain-A-AH

## 2020-10-04 NOTE — Progress Notes (Signed)
Cardiology Office Note  Date:  10/05/2020   ID:  Kollette, Rolf 1933/08/17, MRN [id number]  PCP:  Jerrol Banana., MD   Chief Complaint  Patient presents with   3-4 week follow up     Patient c/o shortness of breath, a-fib & LE edema. Medications reviewed by the patient verbally.      HPI:  Ms. Bisping is a 87 year old woman with a PMH significant for  chronic atrial fibrillation on Coumadin therapy,  diastolic and systolic dysfunction, EF 40 to 45% on echo 01/2018  hypertension,  hyperlipidemia,  morbid obesity,   moderate pulmonary hypertension inferior wall myocardial infarction felt to be secondary to a thrombus from atrial fib in October 2007 at [facility],  episodes of chest pain  Ejection fraction 30 to 35%, moderately elevated right heart pressures, July 16, 2020 following COVID who presents for followup Of her coronary artery disease and leg edema.  Last seen by myself August 18, 2020 Weight at that time 215 pounds Carvedilol 3.25 twice daily started, Entresto 24/26 also started Unable to tolerate Entresto, had various side effects/vision Went back on lisinopril, stopped the coreg  Weight today 214 pounds Recent epidural one week ago Booked for 2 more cortisone in Sept Sugars ran >400 this week Has had significant fluid retention since that time  sedentary, no walking program,  lives alone, legs are getting weak, uses a walker  COVID infection early 2022  Diuretic Regimen: For weight >214: Take metolazone 2.5 mg , 30 min later take torsemide 40 mg For weight 211 to 214: take torsemide 40 mg daily For weight <211, take torsemide 20 mg daily  Deconditioned, family helping her to do everything, very sedentary  Lab work reviewed A1c 6.8 Creatinine 1.09 BUN 24  EKG personally reviewed by myself on todays visit Atrial fib rate 68 IVCD  Echocardiogram July 16, 2020   1. Left ventricular ejection fraction, by estimation, is 30 to 35%.  The  left ventricle has moderately decreased function. The left ventricle  demonstrates global hypokinesis. The left ventricular internal cavity size  was moderately dilated. Left  ventricular diastolic parameters are indeterminate.   2. Right ventricular systolic function is mildly reduced. The right  ventricular size is mildly enlarged. There is moderately elevated  pulmonary artery systolic pressure. The estimated right ventricular  systolic pressure is 0000000 mmHg.   3. Left atrial size was moderately dilated.   4. The mitral valve is normal in structure. Moderate mitral valve  regurgitation.   5. The inferior vena cava is dilated in size with <50% respiratory  variability, suggesting right atrial pressure of 15 mmHg.   Does not use her lymphedema compression pumps  HBA1C 9.1 on labs 12/2018 HBA1C 8.3 on 05/20/2019  PMH:   has a past medical history of Acute myocardial infarction of inferior wall ([place]), Arthritis, Chronic anticoagulation, Chronic atrial fibrillation ([place]), Chronic combined systolic and diastolic CHF (congestive heart failure) ([place]), Essential hypertension, Hyperlipidemia, mixed, Mixed Ischemic/Nonischemic Cardiomyopathy, Nonobstructive coronary artery disease, Obesity, Thyroid disease, Type II diabetes mellitus ([place]), Venous insufficiency, and Vitamin D deficiency.  PSH:    Past Surgical History:  Procedure Laterality Date   CARDIAC CATHETERIZATION  11/2012   [facility];EF 45-50%   CHOLECYSTECTOMY  1999   TUBAL LIGATION  1968   VESICOVAGINAL FISTULA CLOSURE W/ TAH  1996    Current Outpatient Medications  Medication Sig Dispense Refill   ACCU-CHEK AVIVA PLUS test strip USE WITH METER TO CHECK GLUCOSE  BEFORE MEALS AND AT BEDTIME 200 strip 12   albuterol (VENTOLIN HFA) 108 (90 Base) MCG/ACT inhaler TAKE 2 PUFFS BY MOUTH EVERY 6 HOURS AS NEEDED FOR WHEEZE OR SHORTNESS OF BREATH 8.5 each 2   carvedilol (COREG) 3.125 MG tablet Take 1 tablet (3.125 mg total) by mouth 2 (two)  times daily with a meal. 180 tablet 3   estradiol (ESTRACE) 0.1 MG/GM vaginal cream Place 1 Applicatorful vaginally at bedtime. 42.5 g 0   ezetimibe (ZETIA) 10 MG tablet TAKE 1 TABLET BY MOUTH EVERY DAY 90 tablet 2   flavoxATE (URISPAS) 100 MG tablet Take 1 tablet (100 mg total) by mouth 2 (two) times daily as needed for bladder spasms. 60 tablet 11   fluticasone (FLONASE) 50 MCG/ACT nasal spray SPRAY TWO SPRAYS IN EACH NOSTRIL ONCE DAILY 48 mL 3   HYDROcodone-acetaminophen (NORCO) 10-325 MG tablet Take 1 tablet by mouth every 6 (six) hours as needed for severe pain. Must last 30 days. 105 tablet 0   [START ON 10/20/2020] HYDROcodone-acetaminophen (NORCO) 10-325 MG tablet Take 1 tablet by mouth every 6 (six) hours as needed for severe pain. Must last 30 days. 105 tablet 0   [START ON 11/19/2020] HYDROcodone-acetaminophen (NORCO) 10-325 MG tablet Take 1 tablet by mouth every 6 (six) hours as needed for severe pain. Must last 30 days. 105 tablet 0   insulin glargine (LANTUS SOLOSTAR) 100 UNIT/ML Solostar Pen Inject 20 units nightly between 8-10 PM. Adjust as directed. Max dose is 50 units daily.     insulin lispro (HUMALOG KWIKPEN) 100 UNIT/ML KwikPen INJECT 10-20 UNITS THREE TIMES DAILY WITH EACH MEAL. 15 mL 12   Insulin Pen Needle (RELION PEN NEEDLE 31G/8MM) 31G X 8 MM MISC Use with insulin pen three times daily 100 each 12   levothyroxine (SYNTHROID) 100 MCG tablet Take by mouth.     lisinopril (ZESTRIL) 2.5 MG tablet Take 2.5 mg by mouth daily.     LORazepam (ATIVAN) 1 MG tablet TAKE 1 TABLET BY MOUTH TWICE A DAY AS NEEDED 60 tablet 1   losartan (COZAAR) 25 MG tablet Take 1 tablet (25 mg total) by mouth daily. Ok to take with your other cardiac meds 90 tablet 3   Magnesium 500 MG CAPS Take 1,000 mg by mouth daily.      metolazone (ZAROXOLYN) 2.5 MG tablet Take 1 tablet (2.5 mg total) by mouth as directed. For weight >214: Take metolazone 2.5 mg , 30 min later take torsemide 40 mg OR For weight 211  to 214: take torsemide 40 mg daily OR For weight <211, take torsemide 20 mg daily 30 tablet 3   metoprolol tartrate (LOPRESSOR) 25 MG tablet Take 12.5 mg by mouth 2 (two) times daily.     mupirocin ointment (BACTROBAN) 2 % Apply topically daily. 22 g 0   nitroGLYCERIN (NITROSTAT) 0.4 MG SL tablet PLACE 1 TABLET (0.4 MG TOTAL) UNDER THE TONGUE EVERY 5 (FIVE) MINUTES AS NEEDED FOR CHEST PAIN. 25 tablet 1   NONFORMULARY OR COMPOUNDED ITEM See pharmacy note- Not to use on any open skin or wound. 120 each 3   phenazopyridine (PYRIDIUM) 200 MG tablet Take 1 tablet (200 mg total) by mouth 3 (three) times daily as needed for pain. 10 tablet 0   potassium chloride SA (KLOR-CON) 20 MEQ tablet Take 1-2 tablets with metolazone     torsemide (DEMADEX) 20 MG tablet Take 1-2 tablets (20-40 mg total) by mouth as directed. For weight >214: Take metolazone 2.5 mg ,  30 min later take torsemide 40 mg OR For weight 211 to 214: take torsemide 40 mg daily OR For weight <211, take torsemide 20 mg daily (Patient taking differently: Take 40 mg by mouth as directed. For weight >214: Take metolazone 2.5 mg , 30 min later take torsemide 40 mg OR For weight 211 to 214: take torsemide 40 mg daily OR For weight <211, take torsemide 20 mg daily) 180 tablet 3   warfarin (COUMADIN) 3 MG tablet TAKE 1 TABLET BY MOUTH EVERY DAY (Patient taking differently: 4 mg.) 90 tablet 4   No current facility-administered medications for this visit.    Allergies:   Duloxetine; Other; Paroxetine; Sulfa antibiotics; Cephalexin; Clarithromycin; Diphenhydramine; Estrogens conjugated; Estrogens, conjugated; Sulfonamide derivatives; and Nitrofurantoin   Social History:  The patient  reports that she has never smoked. She has never used smokeless tobacco. She reports that she does not drink alcohol and does not use drugs.   Family History:   family history includes Heart disease in her mother; Thyroid disease in her father.    Review of  Systems: Review of Systems  Constitutional: Negative.   HENT: Negative.    Respiratory:  Positive for shortness of breath.   Cardiovascular:  Positive for leg swelling.  Gastrointestinal: Negative.   Musculoskeletal:  Positive for joint pain.  Neurological: Negative.   Psychiatric/Behavioral: Negative.    All other systems reviewed and are negative.  PHYSICAL EXAM: VS:  BP 130/62 (BP Location: Left Arm, Patient Position: Sitting, Cuff Size: Large)   Pulse 68   Ht 5' 5.5" (1.664 m)   Wt 214 lb 4 oz (97.2 kg)   SpO2 95%   BMI 35.11 kg/m  , BMI Body mass index is 35.11 kg/m. Constitutional:  oriented to person, place, and time. No distress.  HENT:  Head: Grossly normal Eyes:  no discharge. No scleral icterus.  Neck: No JVD, no carotid bruits  Cardiovascular: Regular rate and rhythm, no murmurs appreciated Pulmonary/Chest: Clear to auscultation bilaterally, no wheezes or rails Abdominal: Soft.  no distension.  no tenderness.  Musculoskeletal: Normal range of motion Neurological:  normal muscle tone. Coordination normal. No atrophy Skin: Skin warm and dry Psychiatric: normal affect, pleasant   Recent Labs: 11/26/2019: Magnesium 2.1 12/31/2019: TSH <0.005 01/09/2020: ALT 14 07/05/2020: B Natriuretic Peptide 202.1 07/16/2020: Hemoglobin 12.3; Platelets 320 08/12/2020: BUN 24; Creatinine, Ser 1.09; Potassium 4.6; Sodium 142    Lipid Panel Lab Results  Component Value Date   CHOL 172 01/17/2019   HDL 57 01/17/2019   LDLCALC 94 01/17/2019   TRIG 119 01/17/2019      Wt Readings from Last 3 Encounters:  10/05/20 214 lb 4 oz (97.2 kg)  09/28/20 207 lb (93.9 kg)  09/15/20 207 lb (93.9 kg)     ASSESSMENT AND PLAN:  Acute on chronic diastolic and systolic CHF recent COVID, possibly contributing to drop in ejection fraction Could not tolerate Entresto 24/26 mg twice daily, vision side effects Tolerating lisinopril 2.5 daily Took herself off coreg for unclear reasons Could  restart coreg 3.125 twice daily Diuretic regimen as  below, may need extra torsemide in setting of recent cortisone and sensation of abdominal distention, fluid retention For weight <211, take torsemide 40 mg daily For weight 212 to 215: Take metolazone 2.5 mg , 30 min later take torsemide 40 mg For weight >215: Take metolazone 2.5 mg , 30 min later take torsemide 40 mg, and torsemide 20 mg in the PM (2-3 pm)  Cardiomyopathy ejection fraction  45% down to 35%, IVC dilated, moderately elevated right heart pressures Consider carvedilol 3.125 twice daily, stay on lisinopril Could not tolerate Entresto 24/26 mg twice daily  Mixed hyperlipidemia Continue Zetia Statin intolerance, no changes made  Essential hypertension  Restart coreg  Chronic atrial fibrillation (HCC)  A. fib likely contributing to CHF On warfarin,  Rate controlled, carvedilol restarted  Obstructive sleep apnea Previously reported this was "not a major issue" Currently not on CPAP  Type 2 diabetes mellitus without complication, with long-term current use of insulin (HCC) Sugars running high, recent cortisone Scheduled to see endocrine next week   Total encounter time more than 25 minutes  Greater than 50% was spent in counseling and coordination of care with the patient   No orders of the defined types were placed in this encounter.    Signed, Esmond Plants, M.D., Ph.D. 10/05/2020  [facility] [place], [place] [phone]

## 2020-10-05 ENCOUNTER — Other Ambulatory Visit: Payer: Self-pay

## 2020-10-05 ENCOUNTER — Ambulatory Visit (INDEPENDENT_AMBULATORY_CARE_PROVIDER_SITE_OTHER): Payer: Medicare Other | Admitting: Cardiovascular Disease

## 2020-10-05 ENCOUNTER — Encounter: Payer: Self-pay | Admitting: Cardiovascular Disease

## 2020-10-05 VITALS — BP 130/62 | HR 68 | Ht 65.5 in | Wt 214.2 lb

## 2020-10-05 DIAGNOSIS — I25118 Atherosclerotic heart disease of native coronary artery with other forms of angina pectoris: Secondary | ICD-10-CM | POA: Diagnosis not present

## 2020-10-05 DIAGNOSIS — I482 Chronic atrial fibrillation, unspecified: Secondary | ICD-10-CM | POA: Diagnosis not present

## 2020-10-05 DIAGNOSIS — R0602 Shortness of breath: Secondary | ICD-10-CM | POA: Diagnosis not present

## 2020-10-05 DIAGNOSIS — E785 Hyperlipidemia, unspecified: Secondary | ICD-10-CM | POA: Diagnosis not present

## 2020-10-05 DIAGNOSIS — I5042 Chronic combined systolic (congestive) and diastolic (congestive) heart failure: Secondary | ICD-10-CM | POA: Diagnosis not present

## 2020-10-05 MED ORDER — CARVEDILOL 3.125 MG PO TABS: 3.1250 mg | ORAL_TABLET | Freq: Two times a day (BID) | ORAL | 3 refills | Status: DC

## 2020-10-05 MED ORDER — POTASSIUM CHLORIDE 20 MEQ PO PACK: 20.0000 meq | PACK | Freq: Every day | ORAL | 3 refills | Status: DC

## 2020-10-05 MED ORDER — METOLAZONE 2.5 MG PO TABS: 2.5000 mg | ORAL_TABLET | ORAL | 3 refills | Status: DC

## 2020-10-05 MED ORDER — TORSEMIDE 20 MG PO TABS: 40.0000 mg | ORAL_TABLET | Freq: Every day | ORAL | 4 refills | Status: DC

## 2020-10-05 NOTE — Addendum Note (Signed)
Addended by: Matilde Sprang on: 10/05/2020 02:13 PM   Modules accepted: Orders

## 2020-10-05 NOTE — Patient Instructions (Addendum)
Medication Instructions:   Please RESTART coreg 3.125 twice a day  Torsemide dosing For weight <211,   torsemide 40 mg daily For weight 212 to 215:  metolazone 2.5 mg  30 min later take torsemide 40 mg For weight >215: Take metolazone 2.5 mg  30 min later take torsemide 40 mg torsemide 20 mg in the PM (2-3 pm)  If you need a refill on your cardiac medications before your next appointment, please call your pharmacy.   Lab work: No new labs needed  Testing/Procedures: No new testing needed  Follow-Up: At [facility], you and your health needs are our priority.  As part of our continuing mission to provide you with exceptional heart care, we have created designated Provider Care Teams.  These Care Teams include your primary Cardiologist (physician) and Advanced Practice Providers (APPs -  Physician Assistants and Nurse Practitioners) who all work together to provide you with the care you need, when you need it.  You will need a follow up appointment in 3 months  Providers on your designated Care Team:   Murray Hodgkins, NP Christell Faith, PA-C Marrianne Mood, PA-C Cadence Alice, [place]  COVID-19 Vaccine Information can be found at: ShippingScam.co.uk For questions related to vaccine distribution or appointments, please email [email or web address] or call [phone].

## 2020-10-06 MED ORDER — NONFORMULARY OR COMPOUNDED ITEM: 3 refills | Status: DC

## 2020-10-06 NOTE — Addendum Note (Signed)
Addended by: Matilde Sprang on: 10/06/2020 11:49 AM   Modules accepted: Orders

## 2020-10-06 NOTE — Telephone Encounter (Addendum)
Dr. Rosanna Randy, can we send this into the pharmacy?

## 2020-10-06 NOTE — Telephone Encounter (Deleted)
*  GAB 6%

## 2020-10-06 NOTE — Telephone Encounter (Signed)
Called into the pharmacy.

## 2020-10-06 NOTE — Telephone Encounter (Signed)
Reroute needed! Currently sent to the wrong pharmacy, patient is distressed. She needs her compound medication sent to pharmacy listed below -not [facility]   [place] [place] [address]  [place] [address]  Phone: [phone] Fax: [phone]

## 2020-10-06 NOTE — Addendum Note (Signed)
Addended by: Wilburt Finlay on: 10/06/2020 03:57 PM   Modules accepted: Orders

## 2020-10-06 NOTE — Telephone Encounter (Addendum)
Pt reports that she needs this specific compound medication with these specific ingredients Her bottle says: [id number]  120 DIC 4% BA3% BUP3% GAB 6% LIP  She is requesting a call back  [phone] -pt is distressed, she says she needs this Rx.

## 2020-10-07 ENCOUNTER — Other Ambulatory Visit: Payer: Self-pay | Admitting: Family Medicine

## 2020-10-07 DIAGNOSIS — E119 Type 2 diabetes mellitus without complications: Secondary | ICD-10-CM

## 2020-10-11 ENCOUNTER — Ambulatory Visit: Payer: Medicare Other | Admitting: Urology

## 2020-10-11 ENCOUNTER — Other Ambulatory Visit: Payer: Self-pay

## 2020-10-11 VITALS — BP 104/68 | HR 101

## 2020-10-11 DIAGNOSIS — E1122 Type 2 diabetes mellitus with diabetic chronic kidney disease: Secondary | ICD-10-CM | POA: Diagnosis not present

## 2020-10-11 DIAGNOSIS — N3020 Other chronic cystitis without hematuria: Secondary | ICD-10-CM | POA: Diagnosis not present

## 2020-10-11 DIAGNOSIS — Z794 Long term (current) use of insulin: Secondary | ICD-10-CM | POA: Diagnosis not present

## 2020-10-11 DIAGNOSIS — E039 Hypothyroidism, unspecified: Secondary | ICD-10-CM | POA: Diagnosis not present

## 2020-10-11 DIAGNOSIS — N1831 Chronic kidney disease, stage 3a: Secondary | ICD-10-CM | POA: Diagnosis not present

## 2020-10-11 MED ORDER — NITROFURANTOIN MONOHYD MACRO 100 MG PO CAPS: 100.0000 mg | ORAL_CAPSULE | Freq: Every day | ORAL | 2 refills | Status: DC

## 2020-10-11 NOTE — Progress Notes (Signed)
10/11/2020 11:29 AM   Tracy Mann Mar 28, 1933 [id number]  Referring provider: Jerrol Banana., MD [address] [place],  [place] [address]  Chief Complaint  Patient presents with   Follow-up    HPI: Patient has 20-year history of interstitial cystitis treated at [facility] by Dr. Jacqlyn Larsen.  Said she was stable on daily Macrodantin for years.  In the last year perhaps 3 times she has doubled up on the Macrodantin at home when she feels irritated.  She has just gone through 2 courses of Keflex that helped frequency and worsening burning.  She does not think she is infected now.   She has rare stress incontinence.  She normally voids every 1 or 2 hours gets up once at night   2 urine cultures positive for Klebsiella in the last several weeks.    Patient is having breakthrough bladder infections.  I would like to switch prophylaxis to trimethoprim.  Send urine for culture.  Call if positive.  Get renal ultrasound in 2 weeks and call if abnormal.  Otherwise reassess in 8 weeks   When she takes her diuretic she has a lot of frequency.  Also it limits her ability to drink certain fluids affecting her quality life.  She wants to try the Myrbetriq 50 mg samples and prescription as well we will see her in 8 weeks to see if this helps this  Today I saw the patient in February 24, 2019.  She was treated for vaginitis.  She saw my partner in April 2021 for a solid mass 3.2 cm in the mid upper left kidney.  It was cleared by CT scan with renal cyst.  She may have had headaches with the trimethoprim.  She had a positive culture at that time.  She had a negative culture in August 2021  Patient doing well today.  When she gets a flareup of her interstitial cystitis she still says the Macrobid helps.  It causes some nausea but she wanted a prescription I gave her 30 tablets of 100 mg with 2 refills to self treat her self.  She describes rescue treatments at [facility] and she may have been put to  sleep because the red painful.  She is not on Myrbetriq.  She is not on prophylaxis.  She thinks he had headaches with the trimethoprim       PMH: Past Medical History:  Diagnosis Date   Acute myocardial infarction of inferior wall ([place])    a. 10/2005 - Presumed to be 2/2 to a coronary embolus in setting of persistent Afib-->Cath 2014 relatively nl cors, EF 45-50% w/ severe distal inferior/apical inferior HK w/ aneursymal appearance.   Arthritis    Chronic anticoagulation    a. warfarin.   Chronic atrial fibrillation (HCC)    a. CHA2DS2VASc = 6--> warfarin.   Chronic combined systolic and diastolic CHF (congestive heart failure) ([place])    a. 10/2012 Echo: EF 35-40%, diff HK, mild MR, mild biatrial enlargement;  b. 11/2012 EF 45-50% by LV gram; c. echo 07/2014: EF 40-45%, mod conc LVH, mod MR, severe biatrial enlargement, PASP 45 mm Hg c. Echo 01/2018 EF 45-50%, duffuse hypokinesis, mild MR, LA mod dilated, norm RVSF, dilated IVC   Essential hypertension    Hyperlipidemia, mixed    Mixed Ischemic/Nonischemic Cardiomyopathy    a. 10/2012 Echo: EF 35-40%;  b. 11/2012 EF 45-50% by LV gram.   Nonobstructive coronary artery disease    a. 2007 inferior wall MI  thought to be secondary to thrombus from atrial fib b. 2014 cath with R dominant coronary arterial system with mild luminal irregularities c. Myoview 05/2015 old inferior MI, no new ischemic changes   Obesity    Thyroid disease    hypothyroidism   Type II diabetes mellitus ([place])    a. 07/2018 A1c 8.7 - long term insulin use   Venous insufficiency    Vitamin D deficiency     Surgical History: Past Surgical History:  Procedure Laterality Date   CARDIAC CATHETERIZATION  11/2012   [facility];EF 45-50%   CHOLECYSTECTOMY  1999   TUBAL LIGATION  1968   VESICOVAGINAL FISTULA CLOSURE W/ TAH  1996    Home Medications:  Allergies as of 10/11/2020       Reactions   Duloxetine Other (See Comments)   Same as Paroxetine   Other Anaphylaxis,  Swelling   'tongue swelling'   Paroxetine Other (See Comments)   "Horrible headache," Stomach pain, Diarrhea   Sulfa Antibiotics Anaphylaxis   Cephalexin    Blisters   Clarithromycin Other (See Comments)   Hives, headaches, hard time swelling, felt like throat was closing up Hives, headaches, hard time swelling, felt like throat was closing up   Diphenhydramine Other (See Comments)   Estrogens Conjugated    Estrogens Conjugated Other (See Comments)   Sulfonamide Derivatives Swelling   'tongue swelling'   Nitrofurantoin Nausea Only        Medication List        Accurate as of October 11, 2020 11:29 AM. If you have any questions, ask your nurse or doctor.          STOP taking these medications    phenazopyridine 200 MG tablet Commonly known as: Pyridium Stopped by: Reece Packer, MD       TAKE these medications    Accu-Chek Aviva Plus test strip Generic drug: glucose blood USE WITH METER TO CHECK GLUCOSE BEFORE MEALS AND AT BEDTIME   albuterol 108 (90 Base) MCG/ACT inhaler Commonly known as: VENTOLIN HFA TAKE 2 PUFFS BY MOUTH EVERY 6 HOURS AS NEEDED FOR WHEEZE OR SHORTNESS OF BREATH   carvedilol 3.125 MG tablet Commonly known as: Coreg Take 1 tablet (3.125 mg total) by mouth 2 (two) times daily with a meal.   estradiol 0.1 MG/GM vaginal cream Commonly known as: ESTRACE Place 1 Applicatorful vaginally at bedtime.   ezetimibe 10 MG tablet Commonly known as: ZETIA TAKE 1 TABLET BY MOUTH EVERY DAY   flavoxATE 100 MG tablet Commonly known as: URISPAS Take 1 tablet (100 mg total) by mouth 2 (two) times daily as needed for bladder spasms.   fluticasone 50 MCG/ACT nasal spray Commonly known as: FLONASE SPRAY TWO SPRAYS IN EACH NOSTRIL ONCE DAILY   HYDROcodone-acetaminophen 10-325 MG tablet Commonly known as: Norco Take 1 tablet by mouth every 6 (six) hours as needed for severe pain. Must last 30 days. What changed: Another medication with the same  name was removed. Continue taking this medication, and follow the directions you see here. Changed by: Reece Packer, MD   HYDROcodone-acetaminophen 10-325 MG tablet Commonly known as: Norco Take 1 tablet by mouth every 6 (six) hours as needed for severe pain. Must last 30 days. Start taking on: October 20, 2020 What changed: Another medication with the same name was removed. Continue taking this medication, and follow the directions you see here. Changed by: Reece Packer, MD   insulin lispro 100 UNIT/ML KwikPen Commonly known as: HumaLOG KwikPen  INJECT 10-20 UNITS THREE TIMES DAILY WITH EACH MEAL.   Lantus SoloStar 100 UNIT/ML Solostar Pen Generic drug: insulin glargine Inject 20 units nightly between 8-10 PM. Adjust as directed. Max dose is 50 units daily.   levothyroxine 100 MCG tablet Commonly known as: SYNTHROID Take by mouth.   lisinopril 2.5 MG tablet Commonly known as: ZESTRIL Take 2.5 mg by mouth daily.   LORazepam 1 MG tablet Commonly known as: ATIVAN TAKE 1 TABLET BY MOUTH TWICE A DAY AS NEEDED   losartan 25 MG tablet Commonly known as: Cozaar Take 1 tablet (25 mg total) by mouth daily. Ok to take with your other cardiac meds   Magnesium 500 MG Caps Take 1,000 mg by mouth daily.   metolazone 2.5 MG tablet Commonly known as: ZAROXOLYN Take 1 tablet (2.5 mg total) by mouth as directed. 212 to 215 lbs metolazone 2.5 mg 30 min later take torsemide 40 mg, & weight > 215 lbs take metolazone 2.5 mg, 30 min later take torsemide 40 mg   metoprolol tartrate 25 MG tablet Commonly known as: LOPRESSOR Take 12.5 mg by mouth 2 (two) times daily.   mupirocin ointment 2 % Commonly known as: BACTROBAN Apply topically daily.   nitroGLYCERIN 0.4 MG SL tablet Commonly known as: NITROSTAT PLACE 1 TABLET (0.4 MG TOTAL) UNDER THE TONGUE EVERY 5 (FIVE) MINUTES AS NEEDED FOR CHEST PAIN.   NONFORMULARY OR COMPOUNDED ITEM See pharmacy note- Not to use on any open  skin or wound.   potassium chloride 20 MEQ packet Commonly known as: KLOR-CON Take 20 mEq by mouth daily. Take 1-2 package daily with metolazone   RELION PEN NEEDLE 31G/8MM 31G X 8 MM Misc Generic drug: Insulin Pen Needle Use with insulin pen three times daily   torsemide 20 MG tablet Commonly known as: DEMADEX Take 2 tablets (40 mg total) by mouth daily. <214 lbs take 40 mg daily, 212 to 215 lbs metolazone 2.5 mg 30 min later take torsemide 40 mg, & weight > 215 lbs take metolazone 2.5 mg, 30 min later take torsemide 40 mg, then another dose torsemide 20 mg in the PM (2-3 pm)   warfarin 3 MG tablet Commonly known as: COUMADIN Take as directed by the anticoagulation clinic. If you are unsure how to take this medication, talk to your nurse or doctor. Original instructions: TAKE 1 TABLET BY MOUTH EVERY DAY What changed:  how much to take how to take this when to take this        Allergies:  Allergies  Allergen Reactions   Duloxetine Other (See Comments)    Same as Paroxetine   Other Anaphylaxis and Swelling    'tongue swelling'   Paroxetine Other (See Comments)    "Horrible headache," Stomach pain, Diarrhea   Sulfa Antibiotics Anaphylaxis   Cephalexin     Blisters   Clarithromycin Other (See Comments)    Hives, headaches, hard time swelling, felt like throat was closing up Hives, headaches, hard time swelling, felt like throat was closing up   Diphenhydramine Other (See Comments)   Estrogens Conjugated    Estrogens Conjugated Other (See Comments)   Sulfonamide Derivatives Swelling    'tongue swelling'   Nitrofurantoin Nausea Only    Family History: Family History  Problem Relation Age of Onset   Heart disease Mother    Thyroid disease Father     Social History:  reports that she has never smoked. She has never used smokeless tobacco. She reports that she does not  drink alcohol and does not use drugs.  ROS:                                         Physical Exam: BP 104/68   Pulse (!) 101   Constitutional:  Alert and oriented, No acute distress. HEENT: [place] AT, moist mucus membranes.  Trachea midline, no masses.  Laboratory Data: Lab Results  Component Value Date   WBC 9.4 07/16/2020   HGB 12.3 07/16/2020   HCT 36.9 07/16/2020   MCV 89 07/16/2020   PLT 320 07/16/2020    Lab Results  Component Value Date   CREATININE 1.09 (H) 08/12/2020    No results found for: PSA  No results found for: TESTOSTERONE  Lab Results  Component Value Date   HGBA1C 8.2 (H) 12/31/2019    Urinalysis    Component Value Date/Time   APPEARANCEUR Cloudy (A) 02/24/2019 1024   GLUCOSEU Negative 02/24/2019 1024   BILIRUBINUR negative 09/08/2020 1435   BILIRUBINUR Negative 02/24/2019 1024   PROTEINUR Negative 09/08/2020 1435   PROTEINUR Negative 02/24/2019 1024   UROBILINOGEN 0.2 09/08/2020 1435   NITRITE negative 09/08/2020 1435   NITRITE Negative 02/24/2019 1024   LEUKOCYTESUR Moderate (2+) (A) 09/08/2020 1435   LEUKOCYTESUR 1+ (A) 02/24/2019 1024    Pertinent Imaging:   Assessment & Plan: Prescription given.  See in a year.  If she gets a flare hopefully we can get her in quickly with nurse practitioner  There are no diagnoses linked to this encounter.  No follow-ups on file.  Reece Packer, MD  [facility] [address] [place],  [address] [phone]

## 2020-10-12 ENCOUNTER — Other Ambulatory Visit: Payer: Self-pay | Admitting: Family Medicine

## 2020-10-12 DIAGNOSIS — E1169 Type 2 diabetes mellitus with other specified complication: Secondary | ICD-10-CM | POA: Diagnosis not present

## 2020-10-12 DIAGNOSIS — Z789 Other specified health status: Secondary | ICD-10-CM | POA: Diagnosis not present

## 2020-10-12 DIAGNOSIS — N1831 Chronic kidney disease, stage 3a: Secondary | ICD-10-CM | POA: Diagnosis not present

## 2020-10-12 DIAGNOSIS — Z794 Long term (current) use of insulin: Secondary | ICD-10-CM | POA: Diagnosis not present

## 2020-10-12 DIAGNOSIS — E039 Hypothyroidism, unspecified: Secondary | ICD-10-CM | POA: Diagnosis not present

## 2020-10-12 DIAGNOSIS — E782 Mixed hyperlipidemia: Secondary | ICD-10-CM | POA: Diagnosis not present

## 2020-10-12 DIAGNOSIS — E1159 Type 2 diabetes mellitus with other circulatory complications: Secondary | ICD-10-CM | POA: Diagnosis not present

## 2020-10-12 DIAGNOSIS — E1129 Type 2 diabetes mellitus with other diabetic kidney complication: Secondary | ICD-10-CM | POA: Diagnosis not present

## 2020-10-12 DIAGNOSIS — E1165 Type 2 diabetes mellitus with hyperglycemia: Secondary | ICD-10-CM | POA: Diagnosis not present

## 2020-10-12 DIAGNOSIS — E1122 Type 2 diabetes mellitus with diabetic chronic kidney disease: Secondary | ICD-10-CM | POA: Diagnosis not present

## 2020-10-12 NOTE — Telephone Encounter (Signed)
Copied from [place] [id number]. Topic: Quick Communication - Rx Refill/Question >> Oct 12, 2020 10:04 AM [name] A wrote: Medication: insulin glargine (LANTUS SOLOSTAR) 100 UNIT/ML Solostar Pen [id number]   Has the patient contacted their pharmacy? Yes.   (Agent: If no, request that the patient contact the pharmacy for the refill.) (Agent: If yes, when and what did the pharmacy advise?)  Preferred Pharmacy (with phone number or street name): [facility] [name], [place] [facility]  Phone:  [phone] Fax:  [phone]  Agent: Please be advised that RX refills may take up to 3 business days. We ask that you follow-up with your pharmacy.

## 2020-10-12 NOTE — Telephone Encounter (Signed)
Requested medication (s) are due for refill today: Yes  Requested medication (s) are on the active medication list: Yes  Last refill:  05/22/19  Future visit scheduled: Yes  Notes to clinic:  Historical provider.    Requested Prescriptions  Pending Prescriptions Disp Refills   insulin glargine (LANTUS SOLOSTAR) 100 UNIT/ML Solostar Pen 15 mL      Endocrinology:  Diabetes - Insulins Failed - 10/12/2020 10:22 AM      Failed - HBA1C is between 0 and 7.9 and within 180 days    Hemoglobin A1C  Date Value Ref Range Status  02/22/2016 6.6  Final   Hgb A1c MFr Bld  Date Value Ref Range Status  12/31/2019 8.2 (H) 4.8 - 5.6 % Final    Comment:             Prediabetes: 5.7 - 6.4          Diabetes: >6.4          Glycemic control for adults with diabetes: <7.0           Passed - Valid encounter within last 6 months    Recent Outpatient Visits           1 month ago [place] Jerrol Banana., MD   2 months ago Chronic combined systolic and diastolic congestive heart failure [facility])   [facility] Jerrol Banana., MD   5 months ago Urinary frequency   [place] Just, Laurita Quint, FNP   6 months ago Urinary tract infection with hematuria, site unspecified   [facility] Birdie Sons, MD   7 months ago [place] and fatigue   [employer], Vickki Muff, PA-C       Future Appointments             In 2 months Jerrol Banana., MD [facility], [facility]   In 2 months [name], Kathlene November, MD [facility], [place]   In 1 year MacDiarmid, Nicki Reaper, [place]

## 2020-10-14 ENCOUNTER — Encounter: Payer: Self-pay | Admitting: Pain Medicine

## 2020-10-14 ENCOUNTER — Other Ambulatory Visit: Payer: Self-pay

## 2020-10-14 ENCOUNTER — Ambulatory Visit: Payer: Medicare Other | Attending: Pain Medicine | Admitting: Pain Medicine

## 2020-10-14 VITALS — BP 116/52 | HR 95 | Temp 97.3°F | Resp 16 | Ht 65.5 in | Wt 202.8 lb

## 2020-10-14 DIAGNOSIS — Z79899 Other long term (current) drug therapy: Secondary | ICD-10-CM

## 2020-10-14 DIAGNOSIS — M5137 Other intervertebral disc degeneration, lumbosacral region: Secondary | ICD-10-CM | POA: Diagnosis not present

## 2020-10-14 DIAGNOSIS — Z7901 Long term (current) use of anticoagulants: Secondary | ICD-10-CM | POA: Diagnosis not present

## 2020-10-14 DIAGNOSIS — M5417 Radiculopathy, lumbosacral region: Secondary | ICD-10-CM

## 2020-10-14 DIAGNOSIS — M25552 Pain in left hip: Secondary | ICD-10-CM

## 2020-10-14 DIAGNOSIS — M47816 Spondylosis without myelopathy or radiculopathy, lumbar region: Secondary | ICD-10-CM | POA: Diagnosis not present

## 2020-10-14 DIAGNOSIS — G894 Chronic pain syndrome: Secondary | ICD-10-CM

## 2020-10-14 DIAGNOSIS — M4316 Spondylolisthesis, lumbar region: Secondary | ICD-10-CM

## 2020-10-14 DIAGNOSIS — M48061 Spinal stenosis, lumbar region without neurogenic claudication: Secondary | ICD-10-CM | POA: Diagnosis not present

## 2020-10-14 DIAGNOSIS — M25551 Pain in right hip: Secondary | ICD-10-CM

## 2020-10-14 DIAGNOSIS — I739 Peripheral vascular disease, unspecified: Secondary | ICD-10-CM | POA: Diagnosis not present

## 2020-10-14 DIAGNOSIS — M48062 Spinal stenosis, lumbar region with neurogenic claudication: Secondary | ICD-10-CM | POA: Diagnosis not present

## 2020-10-14 DIAGNOSIS — G8929 Other chronic pain: Secondary | ICD-10-CM

## 2020-10-14 DIAGNOSIS — M4727 Other spondylosis with radiculopathy, lumbosacral region: Secondary | ICD-10-CM | POA: Diagnosis not present

## 2020-10-14 DIAGNOSIS — M5442 Lumbago with sciatica, left side: Secondary | ICD-10-CM | POA: Diagnosis not present

## 2020-10-14 DIAGNOSIS — E119 Type 2 diabetes mellitus without complications: Secondary | ICD-10-CM

## 2020-10-14 DIAGNOSIS — M79605 Pain in left leg: Secondary | ICD-10-CM | POA: Diagnosis not present

## 2020-10-14 DIAGNOSIS — M79604 Pain in right leg: Secondary | ICD-10-CM | POA: Diagnosis not present

## 2020-10-14 DIAGNOSIS — Z79891 Long term (current) use of opiate analgesic: Secondary | ICD-10-CM | POA: Diagnosis not present

## 2020-10-14 DIAGNOSIS — M5441 Lumbago with sciatica, right side: Secondary | ICD-10-CM

## 2020-10-14 MED ORDER — HYDROCODONE-ACETAMINOPHEN 10-325 MG PO TABS: 1.0000 | ORAL_TABLET | Freq: Four times a day (QID) | ORAL | 0 refills | Status: DC | PRN

## 2020-10-14 NOTE — Progress Notes (Signed)
PROVIDER NOTE: Information contained herein reflects review and annotations entered in association with encounter. Interpretation of such information and data should be left to medically-trained personnel. Information provided to patient can be located elsewhere in the medical record under "Patient Instructions". Document created using STT-dictation technology, any transcriptional errors that may result from process are unintentional.    Patient: Tracy Mann  Service Category: E/M  Provider: Gaspar Cola, MD  DOB: [birth year]  DOS: 10/14/2020  Specialty: Interventional Pain Management  MRN: [id number]  Setting: Ambulatory outpatient  PCP: Jerrol Banana., MD  Type: Established Patient    Referring Provider: Jerrol Banana.,*  Location: Office  Delivery: Face-to-face     HPI  Tracy Mann, a 87 y.o. year old female, is here today because of her Chronic pain of lower extremity, bilateral [M79.604, M79.605, G89.29]. Tracy Mann primary complain today is Back Pain (Lumbar bilateral, much improved from LESI ) Last encounter: My last encounter with her was on 09/28/2020. Pertinent problems: Tracy Mann has Cervical muscle strain; DDD (degenerative disc disease), lumbosacral; Chronic venous insufficiency; Chronic pain syndrome; Abnormal MRI, lumbar spine (08/22/2019); Lumbar facet hypertrophy (Multilevel) (Bilateral); Lumbar central spinal stenosis (SEVERE) (L3-4) with neurogenic claudication; Grade 1 Anterolisthesis of lumbar spine (L4/L5); Lumbar facet arthropathy (L3-4 right-sided facet edema/joint effusion); Lumbar lateral recess stenosis (L4-5) (Right); Chronic low back pain (3ry area of Pain) (Bilateral) w/ sciatica (Bilateral); Lumbosacral radiculopathy at L5 (Bilateral); Chronic lower extremity pain (1ry area of Pain) (Bilateral); Lumbosacral facet syndrome; Chronic hip pain (2ry area of Pain) (Bilateral) (R>L); Osteoarthritis involving multiple joints; Osteoarthritis of knee  (Right); Osteoarthritis of facet joint of lumbar spine; Osteoarthritis of lumbar spine; Bilateral lower extremity edema; Cellulitis of right lower leg; Chronic knee pain (Bilateral); Peripheral vascular disease ([place]) (lower extremity) (Bilateral); Cervicalgia; Chronic upper back pain; and Lumbosacral spondylosis with radiculopathy on their pertinent problem list. Pain Assessment: Severity of Chronic pain is reported as a 5 /10. Location: Leg Left, Right/ . Onset: More than a month ago. Quality: Restless, Discomfort, Heaviness, Tiring, Constant. Timing: Constant. Modifying factor(s): compound medicine has helped. Vitals:  height is 5' 5.5" (1.664 m) and weight is 202 lb 12.8 oz (92 kg). Her temporal temperature is 97.3 F (36.3 C) (abnormal). Her blood pressure is 116/52 (abnormal) and her pulse is 95. Her respiration is 16 and oxygen saturation is 94%.   Reason for encounter: both, medication management and post-procedure assessment.   The patient indicates doing well with the current medication regimen. No adverse reactions or side effects reported to the medications.   RTCB: 02/17/2021  Post-Procedure Evaluation  Procedure (09/28/2020):  Procedure:           Anesthesia, Analgesia, Anxiolysis:  Type: Therapeutic Inter-Laminar Epidural Steroid Injection           Region: Lumbar Level: L4-5 Level. Laterality: Midline          Type: Local Anesthesia Indication(s): Analgesia         Route: Infiltration ([place]/IM) IV Access: Declined Sedation: Declined  Local Anesthetic: Lidocaine 1-2%   Position: Prone with head of the table was raised to facilitate breathing.    Indications: 1. Lumbar central spinal stenosis (SEVERE) (L3-4) with neurogenic claudication   2. DDD (degenerative disc disease), lumbosacral   3. Lumbosacral radiculopathy at L5 (Bilateral)   4. Grade 1 Anterolisthesis of lumbar spine (L4/L5)   5. Lumbar lateral recess stenosis (L4-5) (Right)   6. Lumbosacral spondylosis with  radiculopathy   7. Chronic  lower extremity pain (1ry area of Pain) (Bilateral)   8. Chronic low back pain (3ry area of Pain) (Bilateral) w/ sciatica (Bilateral)   9. Chronic hip pain (2ry area of Pain) (Bilateral) (R>L)   10. Encounter for therapeutic procedure   11. Chronic anticoagulation (Coumadin)     Pain Score: Pre-procedure: 5 /10 Post-procedure: 0-No pain/10   Anxiolysis: none.  Effectiveness during initial hour after procedure (Ultra-Short Term Relief): 100 %.  Local anesthetic used: Long-acting (4-6 hours) Effectiveness: Defined as any analgesic benefit obtained secondary to the administration of local anesthetics. This carries significant diagnostic value as to the etiological location, or anatomical origin, of the pain. Duration of benefit is expected to coincide with the duration of the local anesthetic used.  Effectiveness during initial 4-6 hours after procedure (Short-Term Relief): 100 %.  Long-term benefit: Defined as any relief past the pharmacologic duration of the local anesthetics.  Effectiveness past the initial 6 hours after procedure (Long-Term Relief): 100 % (pain is completely relieved in left hip.  however thighs and just below knee are restless but are still much improved 50%.).  Benefits, current: Defined as benefit present at the time of this evaluation.   Analgesia: The patient indicates having an ongoing 50% relief of the pain in the lower distal extremities.  The pain that she was having in the left hip is completely gone and has not returned. Function: Tracy Mann reports improvement in function ROM: Tracy Mann reports improvement in ROM  Pharmacotherapy Assessment  Analgesic: Hydrocodone/APAP 10/325, 1 tab p.o. 4 times daily (last filled on 11/12/2019) MME/day: 40 mg/day   Monitoring: [place] PMP: PDMP reviewed during this encounter.       Pharmacotherapy: No side-effects or adverse reactions reported. Compliance: No problems identified. Effectiveness:  Clinically acceptable.  Janett Billow, RN  10/14/2020  1:56 PM  Sign when Signing Visit Safety precautions to be maintained throughout the outpatient stay will include: orient to surroundings, keep bed in low position, maintain call bell within reach at all times, provide assistance with transfer out of bed and ambulation.     UDS:  Summary  Date Value Ref Range Status  03/10/2020 Note  Final    Comment:    ==================================================================== [employer] [facility] (MW) ==================================================================== Test                             Result       Flag       Units  Drug Present   Lorazepam                      >1205                   ng/mg creat    Source of lorazepam is a scheduled prescription medication.    Hydrocodone                    1742                    ng/mg creat   Hydromorphone                  209                     ng/mg creat   Dihydrocodeine                 576  ng/mg creat   Norhydrocodone                 >3012                   ng/mg creat    Sources of hydrocodone include scheduled prescription medications.    Hydromorphone, dihydrocodeine and norhydrocodone are expected    metabolites of hydrocodone. Hydromorphone and dihydrocodeine are    also available as scheduled prescription medications.  ==================================================================== Test                      Result    Flag   Units      Ref Range   Creatinine              166              mg/dL      >=20 ==================================================================== Declared Medications:  Medication list was not provided. ==================================================================== For clinical consultation, please call [phone]. ====================================================================      ROS  Constitutional: Denies any fever or chills Gastrointestinal:  No reported hemesis, hematochezia, vomiting, or acute GI distress Musculoskeletal: Denies any acute onset joint swelling, redness, loss of ROM, or weakness Neurological: No reported episodes of acute onset apraxia, aphasia, dysarthria, agnosia, amnesia, paralysis, loss of coordination, or loss of consciousness  Medication Review  HYDROcodone-acetaminophen, Insulin Pen Needle, LORazepam, Magnesium, NONFORMULARY OR COMPOUNDED ITEM, albuterol, carvedilol, estradiol, ezetimibe, flavoxATE, fluticasone, glucose blood, insulin glargine, insulin lispro, levothyroxine, lisinopril, metolazone, metoprolol succinate, nitroGLYCERIN, nitrofurantoin (macrocrystal-monohydrate), potassium chloride, rOPINIRole, torsemide, and warfarin  History Review  Allergy: Tracy Mann is allergic to duloxetine, other, paroxetine, sulfa antibiotics, cephalexin, clarithromycin, diphenhydramine, estrogens conjugated, estrogens conjugated, sulfonamide derivatives, and nitrofurantoin. Drug: Tracy Mann  reports no history of drug use. Alcohol:  reports no history of alcohol use. Tobacco:  reports that she has never smoked. She has never used smokeless tobacco. Social: Tracy Mann  reports that she has never smoked. She has never used smokeless tobacco. She reports that she does not drink alcohol and does not use drugs. Medical:  has a past medical history of Acute myocardial infarction of inferior wall ([place]), Arthritis, Chronic anticoagulation, Chronic atrial fibrillation ([place]), Chronic combined systolic and diastolic CHF (congestive heart failure) ([place]), Essential hypertension, Hyperlipidemia, mixed, Mixed Ischemic/Nonischemic Cardiomyopathy, Nonobstructive coronary artery disease, Obesity, Thyroid disease, Type II diabetes mellitus ([place]), Venous insufficiency, and Vitamin D deficiency. Surgical: Ms. Espinola  has a past surgical history that includes Vesicovaginal fistula closure w/ TAH (1996); Tubal ligation (1968); Cholecystectomy (1999); and  Cardiac catheterization (11/2012). Family: family history includes Heart disease in her mother; Thyroid disease in her father.  Laboratory Chemistry Profile   Renal Lab Results  Component Value Date   BUN 24 08/12/2020   CREATININE 1.09 (H) 08/12/2020   BCR 22 08/12/2020   GFRAA 73 12/31/2019   GFRNONAA 43 (L) 07/05/2020    Hepatic Lab Results  Component Value Date   AST 26 01/09/2020   ALT 14 01/09/2020   ALBUMIN 3.4 (L) 01/09/2020   ALKPHOS 106 01/09/2020    Electrolytes Lab Results  Component Value Date   NA 142 08/12/2020   K 4.6 08/12/2020   CL 100 08/12/2020   CALCIUM 9.4 08/12/2020   MG 2.1 11/26/2019   PHOS 3.9 09/08/2014    Bone Lab Results  Component Value Date   25OHVITD1 43 11/26/2019   25OHVITD2 9.2 11/26/2019   25OHVITD3 34 11/26/2019    Inflammation (CRP: Acute Phase) (ESR:  Chronic Phase) Lab Results  Component Value Date   CRP 4 11/26/2019   ESRSEDRATE 45 (H) 11/26/2019   LATICACIDVEN 1.4 01/09/2020         Note: Above Lab results reviewed.  Recent Imaging Review  DG PAIN CLINIC C-ARM 1-60 MIN NO REPORT Fluoro was used, but no Radiologist interpretation will be provided.  Please refer to "NOTES" tab for provider progress note. Note: Reviewed        Physical Exam  General appearance: Well nourished, well developed, and well hydrated. In no apparent acute distress Mental status: Alert, oriented x 3 (person, place, & time)       Respiratory: No evidence of acute respiratory distress Eyes: PERLA Vitals: BP (!) 116/52 (BP Location: Right Arm, Patient Position: Sitting, Cuff Size: Large)   Pulse 95   Temp (!) 97.3 F (36.3 C) (Temporal)   Resp 16   Ht 5' 5.5" (1.664 m)   Wt 202 lb 12.8 oz (92 kg)   SpO2 94%   BMI 33.23 kg/m  BMI: Estimated body mass index is 33.23 kg/m as calculated from the following:   Height as of this encounter: 5' 5.5" (1.664 m).   Weight as of this encounter: 202 lb 12.8 oz (92 kg). Ideal: Ideal body weight:  58.2 kg (128 lb 3.2 oz) Adjusted ideal body weight: 71.7 kg (158 lb 0.6 oz)  Assessment   Status Diagnosis  Controlled Controlled Controlled 1. Chronic lower extremity pain (1ry area of Pain) (Bilateral)   2. Chronic hip pain (2ry area of Pain) (Bilateral) (R>L)   3. Chronic low back pain (3ry area of Pain) (Bilateral) w/ sciatica (Bilateral)   4. Lumbosacral radiculopathy at L5 (Bilateral)   5. DDD (degenerative disc disease), lumbosacral   6. Grade 1 Anterolisthesis of lumbar spine (L4/L5)   7. Lumbar central spinal stenosis (SEVERE) (L3-4) with neurogenic claudication   8. Lumbar lateral recess stenosis (L4-5) (Right)   9. Lumbar facet arthropathy (L3-4 right-sided facet edema/joint effusion)   10. Lumbosacral spondylosis with radiculopathy   11. Peripheral vascular disease ([place]) (lower extremity) (Bilateral)   12. Chronic pain syndrome   13. Chronic anticoagulation (Coumadin)   14. Pharmacologic therapy   15. Chronic use of opiate for therapeutic purpose   16. Encounter for chronic pain management   17. Encounter for medication management   18. Type 2 diabetes mellitus without complication, without long-term current use of insulin ([place])      Updated Problems: No problems updated.  Plan of Care  Problem-specific:  No problem-specific Assessment & Plan notes found for this encounter.  Tracy Mann has a current medication list which includes the following long-term medication(s): albuterol, carvedilol, ezetimibe, fluticasone, [START ON 10/20/2020] hydrocodone-acetaminophen, insulin lispro, insulin lispro, levothyroxine, levothyroxine, metolazone, nitroglycerin, potassium chloride, torsemide, warfarin, [START ON 11/19/2020] hydrocodone-acetaminophen, [START ON 12/19/2020] hydrocodone-acetaminophen, [START ON 01/18/2021] hydrocodone-acetaminophen, and [DISCONTINUED] ropinirole.  Pharmacotherapy (Medications Ordered): Meds ordered this encounter  Medications    HYDROcodone-acetaminophen (NORCO) 10-325 MG tablet    Sig: Take 1 tablet by mouth every 6 (six) hours as needed for severe pain. Must last 30 days.    Dispense:  105 tablet    Refill:  0    Not a duplicate. Do NOT delete! Dispense 1 day early if closed on refill date. Avoid benzodiazepines within 8 hours of opioids. Do not send refill requests.   HYDROcodone-acetaminophen (NORCO) 10-325 MG tablet    Sig: Take 1 tablet by mouth every 6 (six) hours as needed for  severe pain. Must last 30 days.    Dispense:  105 tablet    Refill:  0    Not a duplicate. Do NOT delete! Dispense 1 day early if closed on refill date. Avoid benzodiazepines within 8 hours of opioids. Do not send refill requests.   HYDROcodone-acetaminophen (NORCO) 10-325 MG tablet    Sig: Take 1 tablet by mouth every 6 (six) hours as needed for severe pain. Must last 30 days.    Dispense:  105 tablet    Refill:  0    Not a duplicate. Do NOT delete! Dispense 1 day early if closed on refill date. Avoid benzodiazepines within 8 hours of opioids. Do not send refill requests.    Orders:  No orders of the defined types were placed in this encounter.  Follow-up plan:   Return in about 18 weeks (around 02/17/2021) for Eval-day (M,W), (F2F), (MM).     Interventional Therapies  Risk  Complexity Considerations:   Estimated body mass index is 35.73 kg/m as calculated from the following:   Height as of this encounter: 5' 5.5" (1.664 m).   Weight as of this encounter: 218 lb (98.9 kg). NOTE: COUMADIN Anticoagulation (Stop: 5 days  Restart: 2 hours) Decreased GFR   Planned  Pending:   Diagnostic bilateral L3 TFESI #1  Diagnostic right L4 TFESI #1  Diagnostic right L3-4 LESI #1  Diagnostic right L4 TFESI #1  The patient will also need to have x-rays of her cervical and thoracic spine since there are no official reports from the [facility].   Under consideration:   Diagnostic bilateral L4 TFESI x1 (Done 09/12/2019 - Dr. Alba Destine  [[facility]]) Diagnostic midline caudal ESI #1 + diagnostic epidurogram  Diagnostic bilateral IA hip joint injection #1  Diagnostic right L3-4 LESI #1  Diagnostic right L4 TFESI #1  Diagnostic bilateral L3 transforaminal ESI #1  Diagnostic bilateral lumbar facet block #1    Completed:   None at this time   Therapeutic  Palliative (PRN) options:   None established    Recent Visits Date Type Provider Dept  10/14/20 Office Visit Milinda Pointer, MD [facility]  09/28/20 Procedure visit Milinda Pointer, MD [facility]  09/15/20 Office Visit Milinda Pointer, MD [facility]  Showing recent visits within past 90 days and meeting all other requirements Future Appointments No visits were found meeting these conditions. Showing future appointments within next 90 days and meeting all other requirements I discussed the assessment and treatment plan with the patient. The patient was provided an opportunity to ask questions and all were answered. The patient agreed with the plan and demonstrated an understanding of the instructions.  Patient advised to call back or seek an in-person evaluation if the symptoms or condition worsens.  Duration of encounter: 30 minutes.  Note by: Gaspar Cola, MD Date: 10/14/2020; Time: 10:47 AM

## 2020-10-14 NOTE — Progress Notes (Signed)
Safety precautions to be maintained throughout the outpatient stay will include: orient to surroundings, keep bed in low position, maintain call bell within reach at all times, provide assistance with transfer out of bed and ambulation.  

## 2020-10-15 NOTE — Telephone Encounter (Signed)
Please review. Thanks!  

## 2020-10-18 ENCOUNTER — Telehealth: Payer: Self-pay | Admitting: Cardiovascular Disease

## 2020-10-18 ENCOUNTER — Other Ambulatory Visit: Payer: Self-pay | Admitting: *Deleted

## 2020-10-18 MED ORDER — POTASSIUM CHLORIDE CRYS ER 20 MEQ PO TBCR: 20.0000 meq | EXTENDED_RELEASE_TABLET | Freq: Every day | ORAL | 3 refills | Status: DC

## 2020-10-18 NOTE — Telephone Encounter (Signed)
Pt requesting Potassium in tablet form instead of packet. Please see note below. Medication sent in as packet.

## 2020-10-18 NOTE — Telephone Encounter (Signed)
Requested Prescriptions   Signed Prescriptions Disp Refills   potassium chloride SA (KLOR-CON) 20 MEQ tablet 90 tablet 3    Sig: Take 1 tablet (20 mEq total) by mouth daily.    Authorizing Provider: Minna Merritts    Ordering User: Britt Bottom

## 2020-10-18 NOTE — Telephone Encounter (Signed)
*  STAT* If patient is at the pharmacy, call can be transferred to refill team.   1. Which medications need to be refilled? (please list name of each medication and dose if known) Potassium Chloride, requesting tablet form  2. Which pharmacy/location (including street and city if local pharmacy) is medication to be sent to? [facility] [address]  3. Do they need a 30 day or 90 day supply? 90 day

## 2020-10-22 ENCOUNTER — Other Ambulatory Visit: Payer: Self-pay

## 2020-10-22 ENCOUNTER — Telehealth: Payer: Self-pay

## 2020-10-22 NOTE — Telephone Encounter (Addendum)
[facility] fax[facility] refill request for the following medications:  levofloxacin (LEVAQUIN) 250 MG tablet   ondansetron (ZOFRAN) 4 MG tablet  nitrofurantoin, macrocrystal-monohydrate, (MACROBID) 100 MG capsule   lisinopril (ZESTRIL) 2.5 MG tablet   Please advise.

## 2020-10-25 ENCOUNTER — Telehealth: Payer: Self-pay | Admitting: Family Medicine

## 2020-10-25 NOTE — Telephone Encounter (Signed)
[facility] faxed refill request for the following medications:  ondansetron (ZOFRAN) 4 MG tablet   levofloxacin (LEVAQUIN) 250 MG tablet    Please advise.

## 2020-10-26 MED ORDER — LISINOPRIL 2.5 MG PO TABS: 2.5000 mg | ORAL_TABLET | Freq: Every day | ORAL | 3 refills | Status: DC

## 2020-10-26 NOTE — Telephone Encounter (Signed)
Medication request refused. Neither medication is in patient's med list.

## 2020-10-27 ENCOUNTER — Telehealth: Payer: Self-pay | Admitting: Cardiovascular Disease

## 2020-10-27 NOTE — Telephone Encounter (Signed)
Attempted to reach out to Tracy Mann, unable to make contact by phone, okay to LDM on home answering machine (DPR approved), advised on PharmD advise regarding her groin pain and her fingers correlating to torsemide  "Torsemide should not be causing pt's symptoms, she should follow up with her PCP if pain continues" Tracy Mann, Tracy Mann pt to call PCP, Dr. Rosanna Randy to see if he can weigh in on her symptoms or get her in for an appt. Can call back for any further concerns or questions.

## 2020-10-27 NOTE — Telephone Encounter (Signed)
Patient calling  States she will be out this afternoon and would like for [place] to leave a detailed VM with instructions if she is not reached

## 2020-10-27 NOTE — Telephone Encounter (Signed)
Pt c/o medication issue:  1. Name of Medication: Torsemide  2. How are you currently taking this medication (dosage and times per day)? 2 a day  3. Are you having a reaction (difficulty breathing--STAT)? no  4. What is your medication issue? Severe pain in her groin and in fingers. Patient asks if she should stop her fluid medication.

## 2020-10-27 NOTE — Telephone Encounter (Signed)
Torsemide should not be causing pt's symptoms, she should follow up with her PCP if pain continues.

## 2020-11-01 ENCOUNTER — Telehealth: Payer: Self-pay | Admitting: Pain Medicine

## 2020-11-01 NOTE — Telephone Encounter (Signed)
Patient lvmail stating [facility] closed. She needs new script sent to [facility] on [address] for October. As she was not able to pick up this months script. She said, Nov, Dec, and Jan are already at [place]. Just need to get October. Please call patient and advise when script is sent. She isnt out until around the 20th of October

## 2020-11-01 NOTE — Telephone Encounter (Signed)
Called pharmacy and they do have the October script to fill 11/19/2020. Patient called and reassured.

## 2020-11-10 ENCOUNTER — Ambulatory Visit (INDEPENDENT_AMBULATORY_CARE_PROVIDER_SITE_OTHER): Payer: Medicare Other

## 2020-11-10 ENCOUNTER — Other Ambulatory Visit: Payer: Self-pay

## 2020-11-10 DIAGNOSIS — I482 Chronic atrial fibrillation, unspecified: Secondary | ICD-10-CM | POA: Diagnosis not present

## 2020-11-10 LAB — POCT INR
INR: 2.2 (ref 2.0–3.0)
PT: 26.5

## 2020-11-10 NOTE — Patient Instructions (Signed)
continue 3 mg daily except 4 mg  M,W,F. Recheck in 4 weeks

## 2020-12-02 ENCOUNTER — Other Ambulatory Visit: Payer: Self-pay | Admitting: Family Medicine

## 2020-12-02 NOTE — Telephone Encounter (Signed)
Requested medications are on the active medication list yes  Last visit 09/08/20  Future visit scheduled 12/14/20  Notes to clinic UDS more than 360 days ago, not delegated.  Requested Prescriptions  Pending Prescriptions Disp Refills   LORazepam (ATIVAN) 1 MG tablet 60 tablet 1    Sig: Take 1 tablet (1 mg total) by mouth 2 (two) times daily as needed.     Not Delegated - Psychiatry:  Anxiolytics/Hypnotics Failed - 12/02/2020  1:55 PM      Failed - This refill cannot be delegated      Failed - Urine Drug Screen completed in last 360 days      Passed - Valid encounter within last 6 months    Recent Outpatient Visits           2 months ago [facility] Jerrol Banana., MD   3 months ago Chronic combined systolic and diastolic congestive heart failure [facility])   [facility] Jerrol Banana., MD   6 months ago Urinary frequency   [employer] Just, Laurita Quint, FNP   8 months ago Urinary tract infection with hematuria, site unspecified   [facility] Birdie Sons, MD   9 months ago [place] and fatigue   [employer], Vickki Muff, PA-C       Future Appointments             In 1 week Jerrol Banana., MD [facility], [facility]   In 1 month [name], Kathlene November, MD [facility], [facility]   In 10 months Bjorn Loser, [place]

## 2020-12-02 NOTE — Telephone Encounter (Signed)
Medication Refill - Medication: Lorazipam 1 mg twice a day  Has the patient contacted their pharmacy? No.pharmacy has closd (Agent: If no, request that the patient contact the pharmacy for the refill. If patient does not wish to contact the pharmacy document the reason why and proceed with request.) (Agent: If yes, when and what did the pharmacy advise?)  Preferred Pharmacy (with phone number or street name): [facility] [address] Has the patient been seen for an appointment in the last year OR does the patient have an upcoming appointment? Yes.    Agent: Please be advised that RX refills may take up to 3 business days. We ask that you follow-up with your pharmacy.

## 2020-12-07 MED ORDER — LORAZEPAM 1 MG PO TABS: 1.0000 mg | ORAL_TABLET | Freq: Two times a day (BID) | ORAL | 1 refills | Status: DC | PRN

## 2020-12-08 ENCOUNTER — Other Ambulatory Visit: Payer: Self-pay | Admitting: Family Medicine

## 2020-12-08 ENCOUNTER — Other Ambulatory Visit: Payer: Self-pay

## 2020-12-08 ENCOUNTER — Ambulatory Visit (INDEPENDENT_AMBULATORY_CARE_PROVIDER_SITE_OTHER): Payer: Medicare Other

## 2020-12-08 DIAGNOSIS — I482 Chronic atrial fibrillation, unspecified: Secondary | ICD-10-CM

## 2020-12-08 LAB — POCT INR
INR: 1.5 — AB (ref 2.0–3.0)
PT: 18.5

## 2020-12-08 MED ORDER — WARFARIN SODIUM 3 MG PO TABS: 3.0000 mg | ORAL_TABLET | Freq: Every day | ORAL | 4 refills | Status: DC

## 2020-12-08 NOTE — Telephone Encounter (Addendum)
Medication Refill - Medication: warfarin (COUMADIN) 4 MG tablet   Has the patient contacted their pharmacy? Yes.   They told her to call her dr  Preferred Pharmacy (with phone number or street name): [id number] [name], [place] AT [facility] Has the patient been seen for an appointment in the last year OR does the patient have an upcoming appointment? Yes.    Agent: Please be advised that RX refills may take up to 3 business days. We ask that you follow-up with your pharmacy.  She said she takes 3 MG one day and 4 MG the next. Pt has a few days left and it is important she not miss a dose.

## 2020-12-08 NOTE — Telephone Encounter (Signed)
This is refill for  warfarin (COUMADIN) 4 MG tablet  Not 3 MG

## 2020-12-08 NOTE — Patient Instructions (Signed)
Description   Change to 4mg  daily. Recheck in 1-2 weeks.Patient has an appointment 11/15 with provider and wants to have check at that visit.

## 2020-12-08 NOTE — Telephone Encounter (Signed)
Requested medication (s) are due for refill today: No  Requested medication (s) are on the active medication list: No warfarin 3mg  on med list  Last refill:   90 tablet 4 07/07/2020    Future visit scheduled: Yes  Notes to clinic:  Unable to refill per protocol, cannot delegate.      Requested Prescriptions  Pending Prescriptions Disp Refills   warfarin (COUMADIN) 3 MG tablet 90 tablet 4    Sig: Take 1 tablet (3 mg total) by mouth daily.     Hematology:  Anticoagulants - warfarin Failed - 12/08/2020 10:36 AM      Failed - This refill cannot be delegated      Failed - If the patient is managed by Coumadin Clinic - route to their Pool. If not, forward to the provider.      Failed - Valid encounter within last 3 months    Recent Outpatient Visits           3 months ago [place] Jerrol Banana., MD   3 months ago Chronic combined systolic and diastolic congestive heart failure [facility])   [facility] Jerrol Banana., MD   7 months ago Urinary frequency   [employer] Just, Laurita Quint, FNP   8 months ago Urinary tract infection with hematuria, site unspecified   [facility] Birdie Sons, MD   9 months ago [place] and fatigue   [employer], Vickki Muff, PA-C       Future Appointments             In 6 days Jerrol Banana., MD [facility], [place]   In 3 weeks [name], Kathlene November, MD [facility]   In 10 months MacDiarmid, Nicki Reaper, MD [place] - INR in normal range and within 30 days    INR  Date Value Ref Range Status  12/08/2020 1.5 (A) 2.0 - 3.0 Final  01/09/2020 1.4 (H) 0.8 - 1.2 Final    Comment:    (NOTE) INR goal varies based on device and disease states. Performed at [facility], [address] [place], [place] [address]   01/26/2014 3.5  Final    Comment:     INR reference interval applies to patients on anticoagulant therapy. A single INR therapeutic range for coumarins is not optimal for all indications; however, the suggested range for most indications is 2.0 - 3.0. Exceptions to the INR Reference Range may include: Prosthetic heart valves, acute myocardial infarction, prevention of myocardial infarction, and combinations of aspirin and anticoagulant. The need for a higher or lower target INR must be assessed individually. Reference: The Pharmacology and Management of the Vitamin K  antagonists: the seventh ACCP Conference on Antithrombotic and Thrombolytic Therapy. IAXKP.5374 Sept:126 (3suppl): [id number]. A HCT value >55% may artifactually increase the PT.  In one study,  the increase was an average of 25%. Reference:  "Effect on Routine and Special Coagulation Testing Values of Citrate Anticoagulant Adjustment in Patients with High HCT Values." American Journal of Clinical Pathology 2006;126:400-405.

## 2020-12-14 ENCOUNTER — Ambulatory Visit (INDEPENDENT_AMBULATORY_CARE_PROVIDER_SITE_OTHER): Payer: Medicare Other | Admitting: Family Medicine

## 2020-12-14 ENCOUNTER — Encounter: Payer: Self-pay | Admitting: Family Medicine

## 2020-12-14 ENCOUNTER — Other Ambulatory Visit: Payer: Self-pay

## 2020-12-14 VITALS — BP 107/63 | HR 81 | Temp 98.3°F | Ht 66.0 in | Wt 218.0 lb

## 2020-12-14 DIAGNOSIS — M5137 Other intervertebral disc degeneration, lumbosacral region: Secondary | ICD-10-CM | POA: Diagnosis not present

## 2020-12-14 DIAGNOSIS — I482 Chronic atrial fibrillation, unspecified: Secondary | ICD-10-CM | POA: Diagnosis not present

## 2020-12-14 DIAGNOSIS — Z79891 Long term (current) use of opiate analgesic: Secondary | ICD-10-CM

## 2020-12-14 DIAGNOSIS — F411 Generalized anxiety disorder: Secondary | ICD-10-CM

## 2020-12-14 DIAGNOSIS — E782 Mixed hyperlipidemia: Secondary | ICD-10-CM | POA: Diagnosis not present

## 2020-12-14 DIAGNOSIS — Z789 Other specified health status: Secondary | ICD-10-CM | POA: Diagnosis not present

## 2020-12-14 DIAGNOSIS — I25118 Atherosclerotic heart disease of native coronary artery with other forms of angina pectoris: Secondary | ICD-10-CM | POA: Diagnosis not present

## 2020-12-14 DIAGNOSIS — I872 Venous insufficiency (chronic) (peripheral): Secondary | ICD-10-CM | POA: Diagnosis not present

## 2020-12-14 DIAGNOSIS — M48062 Spinal stenosis, lumbar region with neurogenic claudication: Secondary | ICD-10-CM

## 2020-12-14 DIAGNOSIS — G894 Chronic pain syndrome: Secondary | ICD-10-CM | POA: Diagnosis not present

## 2020-12-14 DIAGNOSIS — I428 Other cardiomyopathies: Secondary | ICD-10-CM

## 2020-12-14 DIAGNOSIS — Z6835 Body mass index (BMI) 35.0-35.9, adult: Secondary | ICD-10-CM

## 2020-12-14 LAB — POCT INR
INR: 2.1 (ref 2.0–3.0)
PT: 24.7

## 2020-12-14 NOTE — Patient Instructions (Signed)
Continue 4 mg daily. Recheck in 4 weeks.

## 2020-12-14 NOTE — Progress Notes (Signed)
I,April Miller,acting as a scribe for Wilhemena Durie, MD.,have documented all relevant documentation on the behalf of Wilhemena Durie, MD,as directed by  Wilhemena Durie, MD while in the presence of Wilhemena Durie, MD.   Established patient visit   Patient: Tracy Mann   DOB: [birth year]   87 y.o. Female  MRN: [id number] Visit Date: 12/14/2020  Today's healthcare provider: Wilhemena Durie, MD   Chief Complaint  Patient presents with   Follow-up   Anxiety   Atrial Fibrillation   Subjective    HPI  Patient states everything is stable. She is cardiology seeing endocrinology for diabetes and now seen the pain Center. She states she had side effects with gabapentin but would like to try pregabalin for her restless legs and to help with her pain. From drug has also helped her pain according the patient.  She would like to go up on the dose of the ingredients. Anxiety, Follow-up She continues to complain of chronic anxiety but she seems much less anxious today. She was last seen for anxiety 3 months ago. Changes made at last visit include; Chronic issue, patient is on high-dose lorazepam. Advised to start cutting dose back just because of her age.  And to go to 0.5 mg on next visit.   She reports good compliance with treatment. She reports good tolerance of treatment. She is not having side effects. none  She feels her anxiety is mild and Unchanged since last visit.  GAD-7 Results GAD-7 Generalized Anxiety Disorder Screening Tool 10/15/2019  1. Feeling Nervous, Anxious, or on Edge 3  2. Not Being Able to Stop or Control Worrying 3  3. Worrying Too Much About Different Things 3  4. Trouble Relaxing 3  5. Being So Restless it's Hard To Sit Still 1  6. Becoming Easily Annoyed or Irritable 3  7. Feeling Afraid As If Something Awful Might Happen 3  Total GAD-7 Score 19  Difficulty At Work, Home, or Getting  Along With Others? Somewhat difficult    PHQ-9  Scores PHQ9 SCORE ONLY 09/28/2020 05/11/2020 12/16/2019  PHQ-9 Total Score 0 0 11    ---------------------------------------------------------------------------------------------------  Follow up for Chronic atrial fibrillation:  The patient was last seen for this 1 weeks ago. Changes made at last visit include; Change to 4mg  daily. Recheck in 1-2 weeks.Patient has an appointment 11/15 with provider and wants to have check at that visit.  She reports good compliance with treatment. She feels that condition is Unchanged. She is not having side effects. none  -----------------------------------------------------------------------------------------     Medications: Outpatient Medications Prior to Visit  Medication Sig   ACCU-CHEK AVIVA PLUS test strip USE WITH METER TO CHECK GLUCOSE BEFORE MEALS AND AT BEDTIME   albuterol (VENTOLIN HFA) 108 (90 Base) MCG/ACT inhaler TAKE 2 PUFFS BY MOUTH EVERY 6 HOURS AS NEEDED FOR WHEEZE OR SHORTNESS OF BREATH   estradiol (ESTRACE) 0.1 MG/GM vaginal cream Place 1 Applicatorful vaginally at bedtime.   ezetimibe (ZETIA) 10 MG tablet TAKE 1 TABLET BY MOUTH EVERY DAY   flavoxATE (URISPAS) 100 MG tablet Take 1 tablet (100 mg total) by mouth 2 (two) times daily as needed for bladder spasms.   fluticasone (FLONASE) 50 MCG/ACT nasal spray SPRAY TWO SPRAYS IN EACH NOSTRIL ONCE DAILY   HYDROcodone-acetaminophen (NORCO) 10-325 MG tablet Take 1 tablet by mouth every 6 (six) hours as needed for severe pain. Must last 30 days.   [START ON 01/18/2021] HYDROcodone-acetaminophen (NORCO) 10-325 MG  tablet Take 1 tablet by mouth every 6 (six) hours as needed for severe pain. Must last 30 days.   insulin glargine (LANTUS SOLOSTAR) 100 UNIT/ML Solostar Pen Inject 26 Units into the skin at bedtime.   insulin lispro (HUMALOG) 100 UNIT/ML KwikPen Inject 10-25 Units into the skin in the morning, at noon, and at bedtime.   Insulin Pen Needle (RELION PEN NEEDLE 31G/8MM) 31G X 8 MM  MISC Use with insulin pen three times daily   levothyroxine (SYNTHROID) 88 MCG tablet Take 88 mcg by mouth daily.   lisinopril (ZESTRIL) 2.5 MG tablet Take 1 tablet (2.5 mg total) by mouth daily.   LORazepam (ATIVAN) 1 MG tablet Take 1 tablet (1 mg total) by mouth 2 (two) times daily as needed.   Magnesium 500 MG CAPS Take 1,000 mg by mouth daily.    metolazone (ZAROXOLYN) 2.5 MG tablet Take 1 tablet (2.5 mg total) by mouth as directed. 212 to 215 lbs metolazone 2.5 mg 30 min later take torsemide 40 mg, & weight > 215 lbs take metolazone 2.5 mg, 30 min later take torsemide 40 mg   metoprolol succinate (TOPROL-XL) 25 MG 24 hr tablet Take 25 mg by mouth as needed.   nitroGLYCERIN (NITROSTAT) 0.4 MG SL tablet PLACE 1 TABLET (0.4 MG TOTAL) UNDER THE TONGUE EVERY 5 (FIVE) MINUTES AS NEEDED FOR CHEST PAIN.   NONFORMULARY OR COMPOUNDED ITEM See pharmacy note- Not to use on any open skin or wound.   potassium chloride SA (KLOR-CON) 20 MEQ tablet Take 1 tablet (20 mEq total) by mouth daily.   torsemide (DEMADEX) 20 MG tablet Take 2 tablets (40 mg total) by mouth daily. <214 lbs take 40 mg daily, 212 to 215 lbs metolazone 2.5 mg 30 min later take torsemide 40 mg, & weight > 215 lbs take metolazone 2.5 mg, 30 min later take torsemide 40 mg, then another dose torsemide 20 mg in the PM (2-3 pm)   warfarin (COUMADIN) 3 MG tablet Take 1 tablet (3 mg total) by mouth daily.   carvedilol (COREG) 3.125 MG tablet Take 1 tablet (3.125 mg total) by mouth 2 (two) times daily with a meal. (Patient not taking: Reported on 12/14/2020)   nitrofurantoin, macrocrystal-monohydrate, (MACROBID) 100 MG capsule Take 1 capsule (100 mg total) by mouth daily. (Patient not taking: Reported on 12/14/2020)   [DISCONTINUED] HYDROcodone-acetaminophen (NORCO) 10-325 MG tablet Take 1 tablet by mouth every 6 (six) hours as needed for severe pain. Must last 30 days.   [DISCONTINUED] HYDROcodone-acetaminophen (NORCO) 10-325 MG tablet Take 1 tablet  by mouth every 6 (six) hours as needed for severe pain. Must last 30 days. (Patient not taking: Reported on 12/14/2020)   [DISCONTINUED] insulin lispro (HUMALOG KWIKPEN) 100 UNIT/ML KwikPen INJECT 10-20 UNITS THREE TIMES DAILY WITH EACH MEAL.   [DISCONTINUED] levothyroxine (SYNTHROID) 100 MCG tablet Take 100 mcg by mouth daily before breakfast. (Patient not taking: Reported on 12/14/2020)   No facility-administered medications prior to visit.    Review of Systems  Constitutional:  Negative for appetite change, chills, fatigue and fever.  Respiratory:  Negative for chest tightness and shortness of breath.   Cardiovascular:  Negative for chest pain and palpitations.  Gastrointestinal:  Negative for abdominal pain, nausea and vomiting.  Neurological:  Negative for dizziness and weakness.       Objective    BP 107/63 (BP Location: Right Arm, Patient Position: Sitting, Cuff Size: Large)   Pulse 81   Temp 98.3 F (36.8 C)   Ht  5\' 6"  (1.676 m)   Wt 218 lb (98.9 kg)   SpO2 95%   BMI 35.19 kg/m  BP Readings from Last 3 Encounters:  12/14/20 107/63  10/14/20 (!) 116/52  10/11/20 104/68   Wt Readings from Last 3 Encounters:  12/14/20 218 lb (98.9 kg)  10/14/20 202 lb 12.8 oz (92 kg)  10/05/20 214 lb 4 oz (97.2 kg)      Physical Exam Vitals reviewed.  Constitutional:      General: She is not in acute distress.    Appearance: Normal appearance. She is not ill-appearing.  HENT:     Head: Normocephalic.  Cardiovascular:     Rate and Rhythm: Normal rate and regular rhythm.     Pulses: Normal pulses.     Heart sounds: Normal heart sounds. No murmur heard.   No friction rub. No gallop.  Pulmonary:     Effort: Pulmonary effort is normal. No respiratory distress.     Breath sounds: Normal breath sounds. No stridor. No wheezing, rhonchi or rales.  Abdominal:     General: Bowel sounds are normal.     Palpations: Abdomen is soft.     Tenderness: There is no abdominal tenderness.  There is no right CVA tenderness or left CVA tenderness.  Musculoskeletal:     Right lower leg: No edema.     Left lower leg: No edema.     Comments: 1+ lower extremity edema chronic  Skin:    General: Skin is warm and dry.     Comments: Very fair skin.  Neurological:     Mental Status: She is alert and oriented to person, place, and time.  Psychiatric:        Mood and Affect: Mood normal.        Behavior: Behavior normal.      No results found for any visits on 12/14/20.  Assessment & Plan     1. Anxiety, generalized Anxiety.  Need to cut down on benzodiazepine use this 87 year old.  2. Chronic atrial fibrillation (HCC) On Coumadin and followed by cardiology also - POCT INR  3. Mixed Ischemic/Nonischemic Cardiomyopathy EF 30 to 35% but evidently she felt she was completely intolerant of Entresto.  4. Chronic venous insufficiency Support hose  5. Atherosclerosis of native coronary artery of native heart with stable angina pectoris ([place]) Risk factors treated is much as possible  6. DDD (degenerative disc disease), lumbosacral   7. Statin intolerance On Zetia as she claims she is statin intolerant of myopathy  8. Mixed hyperlipidemia   9. Class 2 severe obesity due to excess calories with serious comorbidity and body mass index (BMI) of 35.0 to 35.9 in adult ([place])   10. Chronic pain syndrome Followed by Dr. Dossie Arbour from pain   11. Lumbar central spinal stenosis (SEVERE) (L3-4) with neurogenic claudication   12. Chronic use of opiate for therapeutic purpose      No follow-ups on file.      I, Wilhemena Durie, MD, have reviewed all documentation for this visit. The documentation on 12/17/20 for the exam, diagnosis, procedures, and orders are all accurate and complete.    Plato Alspaugh Cranford Mon, MD  [facility] [phone] (phone) [phone] (fax)  [place]

## 2020-12-15 DIAGNOSIS — R809 Proteinuria, unspecified: Secondary | ICD-10-CM | POA: Diagnosis not present

## 2020-12-15 DIAGNOSIS — E1169 Type 2 diabetes mellitus with other specified complication: Secondary | ICD-10-CM | POA: Diagnosis not present

## 2020-12-15 DIAGNOSIS — E1129 Type 2 diabetes mellitus with other diabetic kidney complication: Secondary | ICD-10-CM | POA: Diagnosis not present

## 2020-12-15 DIAGNOSIS — N1831 Chronic kidney disease, stage 3a: Secondary | ICD-10-CM | POA: Diagnosis not present

## 2020-12-15 DIAGNOSIS — Z789 Other specified health status: Secondary | ICD-10-CM | POA: Diagnosis not present

## 2020-12-15 DIAGNOSIS — M791 Myalgia, unspecified site: Secondary | ICD-10-CM | POA: Diagnosis not present

## 2020-12-15 DIAGNOSIS — Z794 Long term (current) use of insulin: Secondary | ICD-10-CM | POA: Diagnosis not present

## 2020-12-15 DIAGNOSIS — E1159 Type 2 diabetes mellitus with other circulatory complications: Secondary | ICD-10-CM | POA: Diagnosis not present

## 2020-12-15 DIAGNOSIS — E1122 Type 2 diabetes mellitus with diabetic chronic kidney disease: Secondary | ICD-10-CM | POA: Diagnosis not present

## 2020-12-17 ENCOUNTER — Other Ambulatory Visit: Payer: Self-pay | Admitting: Family Medicine

## 2020-12-17 DIAGNOSIS — N951 Menopausal and female climacteric states: Secondary | ICD-10-CM

## 2020-12-17 MED ORDER — PREGABALIN 50 MG PO CAPS: 50.0000 mg | ORAL_CAPSULE | Freq: Every day | ORAL | 5 refills | Status: DC

## 2020-12-17 NOTE — Telephone Encounter (Signed)
Requested medication (s) are due for refill today: Yes  Requested medication (s) are on the active medication list: Yes  Last refill:  06/09/20   Future visit scheduled: Yes  Notes to clinic:  .mammogram not up to date. Please advise     Requested Prescriptions  Pending Prescriptions Disp Refills   estradiol (ESTRACE) 0.1 MG/GM vaginal cream 42.5 g 0    Sig: Place 1 Applicatorful vaginally at bedtime.     OB/GYN:  Estrogens Failed - 12/17/2020 12:11 PM      Failed - Mammogram is up-to-date per Health Maintenance      Passed - Last BP in normal range    BP Readings from Last 1 Encounters:  12/14/20 107/63          Passed - Valid encounter within last 12 months    Recent Outpatient Visits           3 days ago Anxiety, generalized   [facility] Jerrol Banana., MD   3 months ago [place] Jerrol Banana., MD   4 months ago Chronic combined systolic and diastolic congestive heart failure [facility])   [facility] Jerrol Banana., MD   7 months ago Urinary frequency   [employer] [facility], Laurita Quint, FNP   8 months ago Urinary tract infection with hematuria, site unspecified   [facility] Birdie Sons, MD       Future Appointments             In 2 weeks [name], Kathlene November, MD [facility], [facility]   In 3 months Jerrol Banana., MD [facility]   In 10 months MacDiarmid, Nicki Reaper, [place]

## 2020-12-17 NOTE — Telephone Encounter (Signed)
Medication: estradiol (ESTRACE) 0.1 MG/GM vaginal cream [[id number]]   Has the patient contacted their pharmacy? YES  advised to contact the office. Last fill (Agent: If no, request that the patient contact the pharmacy for the refill. If patient does not wish to contact the pharmacy document the reason why and proceed with request.) (Agent: If yes, when and what did the pharmacy advise?)  Preferred Pharmacy (with phone number or street name): [facility], [place] - [address] AT [facility] [place] [address] Phone: [phone] Fax: [phone] Hours: Not open 24 hours   Has the patient been seen for an appointment in the last year OR does the patient have an upcoming appointment? YES 12/04/20  Agent: Please be advised that RX refills may take up to 3 business days. We ask that you follow-up with your pharmacy.

## 2020-12-20 ENCOUNTER — Ambulatory Visit: Payer: Self-pay

## 2020-12-20 ENCOUNTER — Ambulatory Visit: Payer: Self-pay | Admitting: *Deleted

## 2020-12-20 ENCOUNTER — Encounter: Payer: Medicare Other | Admitting: Pain Medicine

## 2020-12-20 MED ORDER — ESTRADIOL 0.1 MG/GM VA CREA: 1.0000 | TOPICAL_CREAM | Freq: Every day | VAGINAL | 0 refills | Status: DC

## 2020-12-20 NOTE — Telephone Encounter (Signed)
Pt triaged this AM, UTI symptoms. Pt states she is on MAcrobid daily but stopped taking 2 months ago "Because I felt better." States did take 2 last night as "This flared up again." Pt requesting Levaquin be started ASAP "Because it knocks it right out."  Has appt tomorrow per earlier triage, states "I really don't feel well enough to get out but if I have to in order to get the Levaquin I will. But anything you can do to help me out is appreciated."   Assured pt NT would route to practice for PCPs review and final disposition. See previous encounter. Advised UC for worsening symptoms.  Please advise :  [phone] Reason for Disposition . Health Information question, no triage required and triager able to answer question  Protocols used: Information Only Call - No Triage-A-AH

## 2020-12-20 NOTE — Telephone Encounter (Signed)
Pt. Started having bladder symptoms x 6 days ago. Has pain, burning and back pain. Appointment made for tomorrow. Instructed to go to UC for worsening of symptoms.     Reason for Disposition  Side (flank) or lower back pain present  Answer Assessment - Initial Assessment Questions 1. SYMPTOM: "What's the main symptom you're concerned about?" (e.g., frequency, incontinence)     Burning, right side pain 2. ONSET: "When did the  symptoms  start?"     6 days ago 3. PAIN: "Is there any pain?" If Yes, ask: "How bad is it?" (Scale: 1-10; mild, moderate, severe)     Moderate 4. CAUSE: "What do you think is causing the symptoms?"     UTI 5. OTHER SYMPTOMS: "Do you have any other symptoms?" (e.g., fever, flank pain, blood in urine, pain with urination)     Back pain 6. PREGNANCY: "Is there any chance you are pregnant?" "When was your last menstrual period?"     No  Protocols used: Urinary Symptoms-A-AH

## 2020-12-21 ENCOUNTER — Encounter: Payer: Self-pay | Admitting: Physician Assistant

## 2020-12-21 ENCOUNTER — Other Ambulatory Visit: Payer: Self-pay

## 2020-12-21 ENCOUNTER — Ambulatory Visit (INDEPENDENT_AMBULATORY_CARE_PROVIDER_SITE_OTHER): Payer: Medicare Other | Admitting: Physician Assistant

## 2020-12-21 VITALS — BP 124/61 | HR 87 | Ht 65.5 in | Wt 214.9 lb

## 2020-12-21 DIAGNOSIS — N3000 Acute cystitis without hematuria: Secondary | ICD-10-CM

## 2020-12-21 DIAGNOSIS — N179 Acute kidney failure, unspecified: Secondary | ICD-10-CM | POA: Diagnosis not present

## 2020-12-21 MED ORDER — CIPROFLOXACIN HCL 500 MG PO TABS: 500.0000 mg | ORAL_TABLET | Freq: Two times a day (BID) | ORAL | 0 refills | Status: AC

## 2020-12-21 NOTE — Progress Notes (Signed)
Established patient visit   Patient: Tracy Mann   DOB: 1933/03/18   87 y.o. Female  MRN: [id number] Visit Date: 12/21/2020  Today's healthcare provider: [name], PA-C   Chief Complaint  Patient presents with   Urinary Tract Infection   Subjective    HPI  Tracy Mann is an 87 y/o female who presents today with complaints of decreased urine output, dysuria, lower abdominal pain, ankle swelling.  She reports she took her first shot of Trulicity this past Wednesday and Thursday evening started feeling the symptoms.  Denies fevers, chills, body aches.  The peripheral edema has decreased over the past few days.  She has been taking Pyridium 3-4 times a day since Thursday for relief of dysuria.  She takes a daily 100 mg Macrobid for UTI prevention.  She has been doubling up on this for the past few days.  Unable to give a full urine specimen, only small amounts of urine every time she urinates.  She still is able to get a small amount out every time she tries to urinate. She does note that Levaquin typically works well for her when she has UTIs.  Medications: Outpatient Medications Prior to Visit  Medication Sig   ACCU-CHEK AVIVA PLUS test strip USE WITH METER TO CHECK GLUCOSE BEFORE MEALS AND AT BEDTIME   albuterol (VENTOLIN HFA) 108 (90 Base) MCG/ACT inhaler TAKE 2 PUFFS BY MOUTH EVERY 6 HOURS AS NEEDED FOR WHEEZE OR SHORTNESS OF BREATH   carvedilol (COREG) 3.125 MG tablet Take 1 tablet (3.125 mg total) by mouth 2 (two) times daily with a meal.   estradiol (ESTRACE) 0.1 MG/GM vaginal cream Place 1 Applicatorful vaginally at bedtime.   ezetimibe (ZETIA) 10 MG tablet TAKE 1 TABLET BY MOUTH EVERY DAY   flavoxATE (URISPAS) 100 MG tablet Take 1 tablet (100 mg total) by mouth 2 (two) times daily as needed for bladder spasms.   fluticasone (FLONASE) 50 MCG/ACT nasal spray SPRAY TWO SPRAYS IN EACH NOSTRIL ONCE DAILY   [START ON 01/18/2021] HYDROcodone-acetaminophen (NORCO) 10-325 MG  tablet Take 1 tablet by mouth every 6 (six) hours as needed for severe pain. Must last 30 days.   insulin glargine (LANTUS SOLOSTAR) 100 UNIT/ML Solostar Pen Inject 26 Units into the skin at bedtime.   insulin lispro (HUMALOG) 100 UNIT/ML KwikPen Inject 10-25 Units into the skin in the morning, at noon, and at bedtime.   Insulin Pen Needle (RELION PEN NEEDLE 31G/8MM) 31G X 8 MM MISC Use with insulin pen three times daily   levothyroxine (SYNTHROID) 88 MCG tablet Take 88 mcg by mouth daily.   lisinopril (ZESTRIL) 2.5 MG tablet Take 1 tablet (2.5 mg total) by mouth daily.   LORazepam (ATIVAN) 1 MG tablet Take 1 tablet (1 mg total) by mouth 2 (two) times daily as needed.   Magnesium 500 MG CAPS Take 1,000 mg by mouth daily.    metolazone (ZAROXOLYN) 2.5 MG tablet Take 1 tablet (2.5 mg total) by mouth as directed. 212 to 215 lbs metolazone 2.5 mg 30 min later take torsemide 40 mg, & weight > 215 lbs take metolazone 2.5 mg, 30 min later take torsemide 40 mg   metoprolol succinate (TOPROL-XL) 25 MG 24 hr tablet Take 25 mg by mouth as needed.   nitrofurantoin, macrocrystal-monohydrate, (MACROBID) 100 MG capsule Take 1 capsule (100 mg total) by mouth daily.   nitroGLYCERIN (NITROSTAT) 0.4 MG SL tablet PLACE 1 TABLET (0.4 MG TOTAL) UNDER THE TONGUE EVERY 5 (FIVE)  MINUTES AS NEEDED FOR CHEST PAIN.   NONFORMULARY OR COMPOUNDED ITEM See pharmacy note- Not to use on any open skin or wound.   potassium chloride SA (KLOR-CON) 20 MEQ tablet Take 1 tablet (20 mEq total) by mouth daily.   pregabalin (LYRICA) 50 MG capsule Take 1 capsule (50 mg total) by mouth at bedtime.   torsemide (DEMADEX) 20 MG tablet Take 2 tablets (40 mg total) by mouth daily. <214 lbs take 40 mg daily, 212 to 215 lbs metolazone 2.5 mg 30 min later take torsemide 40 mg, & weight > 215 lbs take metolazone 2.5 mg, 30 min later take torsemide 40 mg, then another dose torsemide 20 mg in the PM (2-3 pm)   TRULICITY 1.5 CW/8.8QB SOPN Inject 1.5 mg  into the skin once a week.   warfarin (COUMADIN) 3 MG tablet Take 1 tablet (3 mg total) by mouth daily.   HYDROcodone-acetaminophen (NORCO) 10-325 MG tablet Take 1 tablet by mouth every 6 (six) hours as needed for severe pain. Must last 30 days.   No facility-administered medications prior to visit.    Review of Systems  Constitutional:  Negative for fever.  Cardiovascular:  Positive for leg swelling.  Genitourinary:  Positive for decreased urine volume, difficulty urinating, dysuria, flank pain, frequency and urgency.   Last CBC Lab Results  Component Value Date   WBC 9.4 07/16/2020   HGB 12.3 07/16/2020   HCT 36.9 07/16/2020   MCV 89 07/16/2020   MCH 29.6 07/16/2020   RDW 12.3 07/16/2020   PLT 320 16/94/5038   Last metabolic panel Lab Results  Component Value Date   GLUCOSE 105 (H) 08/12/2020   NA 142 08/12/2020   K 4.6 08/12/2020   CL 100 08/12/2020   CO2 29 08/12/2020   BUN 24 08/12/2020   CREATININE 1.09 (H) 08/12/2020   EGFR 50 (L) 08/12/2020   CALCIUM 9.4 08/12/2020   PHOS 3.9 09/08/2014   PROT 7.2 01/09/2020   ALBUMIN 3.4 (L) 01/09/2020   LABGLOB 2.6 12/31/2019   AGRATIO 1.5 12/31/2019   BILITOT 1.1 01/09/2020   ALKPHOS 106 01/09/2020   AST 26 01/09/2020   ALT 14 01/09/2020   ANIONGAP 10 07/05/2020   Last lipids Lab Results  Component Value Date   CHOL 172 01/17/2019   HDL 57 01/17/2019   LDLCALC 94 01/17/2019   TRIG 119 01/17/2019   CHOLHDL 3.0 01/17/2019   Last hemoglobin A1c Lab Results  Component Value Date   HGBA1C 8.2 (H) 12/31/2019   Last thyroid functions Lab Results  Component Value Date   TSH <0.005 (L) 12/31/2019   T4TOTAL 8.2 11/26/2007   Last vitamin D Lab Results  Component Value Date   25OHVITD2 9.2 11/26/2019   25OHVITD3 34 11/26/2019   Last vitamin B12 and Folate Lab Results  Component Value Date   VITAMINB12 360 11/26/2019       Objective    BP 124/61   Pulse 87   Ht 5' 5.5" (1.664 m)   Wt 214 lb 14.4 oz  (97.5 kg)   SpO2 91%   BMI 35.22 kg/m  BP Readings from Last 3 Encounters:  12/21/20 124/61  12/14/20 107/63  10/14/20 (!) 116/52   Wt Readings from Last 3 Encounters:  12/21/20 214 lb 14.4 oz (97.5 kg)  12/14/20 218 lb (98.9 kg)  10/14/20 202 lb 12.8 oz (92 kg)      Physical Exam Constitutional:      General: She is awake.  Appearance: She is well-developed.  HENT:     Head: Normocephalic.  Eyes:     Conjunctiva/sclera: Conjunctivae normal.  Cardiovascular:     Rate and Rhythm: Normal rate and regular rhythm.     Pulses: Normal pulses.     Heart sounds: Normal heart sounds.  Pulmonary:     Effort: Pulmonary effort is normal.     Breath sounds: Normal breath sounds.  Abdominal:     Palpations: Abdomen is soft.     Tenderness: There is abdominal tenderness in the suprapubic area.  Musculoskeletal:     Right lower leg: No edema.     Left lower leg: No edema.  Skin:    General: Skin is warm.  Neurological:     Mental Status: She is alert and oriented to person, place, and time.  Psychiatric:        Attention and Perception: Attention normal.        Mood and Affect: Mood normal.        Speech: Speech normal.        Behavior: Behavior is cooperative.     No results found for any visits on 12/21/20.  Assessment & Plan     Acute Urinary tract infection Discussed treating with Cipro which is a similar antibiotic to Levaquin.  500 mg twice daily x5 days.   Advised her to decrease her amount of Pyridium, to really only take a maximum of 3 times a day but may be to stop taking it to see how she feels.  This might help with her difficulty urinating.  We will be in touch tomorrow to see if there is any improvement.  I did advise her if she becomes completely unable to pass any urine she needs to go to the emergency room.  Please call the office if she develops fevers, chills, body aches.  2. AKI secondary to Trulicity Advised patient to d/c Trulicity do not take a  second injection.  Very possible it caused an AKI which led to a UTI.   Will check and monitor CMP for kidney function.  Return if symptoms worsen or fail to improve.      I, [name], PA-C have reviewed all documentation for this visit. The documentation on  12/21/2020 for the exam, diagnosis, procedures, and orders are all accurate and complete.    [name], PA-C  [facility] [phone] (phone) [phone] (fax)  [place]

## 2020-12-22 ENCOUNTER — Telehealth: Payer: Self-pay | Admitting: Physician Assistant

## 2020-12-22 LAB — COMPREHENSIVE METABOLIC PANEL
ALT: 10 IU/L (ref 0–32)
AST: 23 IU/L (ref 0–40)
Albumin/Globulin Ratio: 1.3 (ref 1.2–2.2)
Albumin: 4.1 g/dL (ref 3.6–4.6)
Alkaline Phosphatase: 128 IU/L — ABNORMAL HIGH (ref 44–121)
BUN/Creatinine Ratio: 25 (ref 12–28)
BUN: 36 mg/dL — ABNORMAL HIGH (ref 8–27)
Bilirubin Total: 0.6 mg/dL (ref 0.0–1.2)
CO2: 27 mmol/L (ref 20–29)
Calcium: 9.5 mg/dL (ref 8.7–10.3)
Chloride: 98 mmol/L (ref 96–106)
Creatinine, Ser: 1.43 mg/dL — ABNORMAL HIGH (ref 0.57–1.00)
Globulin, Total: 3.2 g/dL (ref 1.5–4.5)
Glucose: 112 mg/dL — ABNORMAL HIGH (ref 70–99)
Potassium: 4.6 mmol/L (ref 3.5–5.2)
Sodium: 141 mmol/L (ref 134–144)
Total Protein: 7.3 g/dL (ref 6.0–8.5)
eGFR: 36 mL/min/{1.73_m2} — ABNORMAL LOW (ref >59)

## 2020-12-22 NOTE — Telephone Encounter (Signed)
Pt returned Lindsay's call. Pt says that she is feeling better today than yesterday. Pt wanted to make provider aware that she isn't having any side effects from medication. Pt says that she's not hurting as bad. Pt says that her urine output has gotten somewhat better she's still not 100 percent yet.    Pt would like to thank you provider for helping and checking on her.

## 2020-12-22 NOTE — Telephone Encounter (Signed)
Called to see if urine output has improved on antibiotic, no answer left message

## 2021-01-03 ENCOUNTER — Other Ambulatory Visit: Payer: Self-pay | Admitting: Family Medicine

## 2021-01-03 DIAGNOSIS — J309 Allergic rhinitis, unspecified: Secondary | ICD-10-CM

## 2021-01-03 NOTE — Telephone Encounter (Signed)
Requested medication (s) are due for refill today: yes  Requested medication (s) are on the active medication list: yes  Future visit scheduled: 03/2021  Notes to clinic:  this rx does not have signature, please assess. Did not see visit that addressed this med/dx but pt is seen quite frequently.   Requested Prescriptions  Pending Prescriptions Disp Refills   fluticasone (FLONASE) 50 MCG/ACT nasal spray 48 mL 3     Ear, Nose, and Throat: Nasal Preparations - Corticosteroids Passed - 01/03/2021  2:09 PM      Passed - Valid encounter within last 12 months    Recent Outpatient Visits           1 week ago Acute cystitis without hematuria   [facility] [name], PA-C   2 weeks ago Anxiety, generalized   [facility] Jerrol Banana., MD   3 months ago [place] Jerrol Banana., MD   4 months ago Chronic combined systolic and diastolic congestive heart failure [facility])   [facility] Jerrol Banana., MD   7 months ago Urinary frequency   [place] Just, Laurita Quint, FNP       Future Appointments             Tomorrow Rockey Situ, Kathlene November, MD [facility], [facility]   In 3 months Jerrol Banana., MD [facility], [facility]   In 9 months MacDiarmid, Nicki Reaper, [facility]

## 2021-01-03 NOTE — Progress Notes (Signed)
Cardiology Office Note  Date:  01/04/2021   ID:  Tracy, Mann 1933/11/02, MRN [id number]  PCP:  Jerrol Banana., MD   Chief Complaint  Patient presents with   3 month follow up     Patient c/o LE edema and shortness of breath with walking a short distance. Medications reviewed by the patient verbally.      HPI:  Tracy Mann is a 87 year old woman with a PMH significant for  chronic atrial fibrillation on Coumadin therapy,  diastolic and systolic dysfunction, EF 40 to 45% on echo 01/2018  hypertension,  hyperlipidemia,  morbid obesity,   moderate pulmonary hypertension inferior wall myocardial infarction felt to be secondary to a thrombus from atrial fib in October 2007 at [facility],  episodes of chest pain  Ejection fraction 30 to 35%, moderately elevated right heart pressures, July 16, 2020 following COVID Medication intolerances who presents for followup Of her coronary artery disease and leg edema.  LOV 09/2020  More active Taking torsemide daily, Metolazone 2 x a week Feels her leg swelling is stable, improved Now walking with a walker more, getting out more, going shopping Overall happy with how she feels  Could not tolerate trulicity "Body slowed up, could not urinate", stop the medication  Weight down 17 pounds from 03/2019 Stable this year  Labs reviewed A1C 8.3  Stopped metoprolol on her own Tolerating lisinopril 2.5 daily Not taking metoprolol  Sedentary, no regular exercise  EKG personally reviewed by myself on todays visit Atrial fib rate 81 bpm, consider old anterior MI  Other past medical history reviewed Previous attempt to use Carvedilol 3.25 twice daily started, Entresto 24/26 unsuccessful secondary to intolerance Went back on lisinopril, stopped the coreg  COVID infection early 2022  Diuretic Regimen: Previously discussed with her For weight >214: Take metolazone 2.5 mg , 30 min later take torsemide 40 mg For  weight 211 to 214: take torsemide 40 mg daily For weight <211, take torsemide 20 mg daily  Echocardiogram July 16, 2020   1. Left ventricular ejection fraction, by estimation, is 30 to 35%. The  left ventricle has moderately decreased function. The left ventricle  demonstrates global hypokinesis. The left ventricular internal cavity size  was moderately dilated. Left  ventricular diastolic parameters are indeterminate.   2. Right ventricular systolic function is mildly reduced. The right  ventricular size is mildly enlarged. There is moderately elevated  pulmonary artery systolic pressure. The estimated right ventricular  systolic pressure is 94.8 mmHg.   3. Left atrial size was moderately dilated.   4. The mitral valve is normal in structure. Moderate mitral valve  regurgitation.   5. The inferior vena cava is dilated in size with <50% respiratory  variability, suggesting right atrial pressure of 15 mmHg.   Does not use her lymphedema compression pumps  HBA1C 9.1 on labs 12/2018 HBA1C 8.3 on 05/20/2019  PMH:   has a past medical history of Acute myocardial infarction of inferior wall ([place]), Arthritis, Chronic anticoagulation, Chronic atrial fibrillation ([place]), Chronic combined systolic and diastolic CHF (congestive heart failure) ([place]), Essential hypertension, Hyperlipidemia, mixed, Mixed Ischemic/Nonischemic Cardiomyopathy, Nonobstructive coronary artery disease, Obesity, Thyroid disease, Type II diabetes mellitus ([place]), Venous insufficiency, and Vitamin D deficiency.  PSH:    Past Surgical History:  Procedure Laterality Date   CARDIAC CATHETERIZATION  11/2012   [facility];EF 45-50%   CHOLECYSTECTOMY  1999   TUBAL LIGATION  1968   VESICOVAGINAL FISTULA CLOSURE W/ TAH  1996  Current Outpatient Medications  Medication Sig Dispense Refill   ACCU-CHEK AVIVA PLUS test strip USE WITH METER TO CHECK GLUCOSE BEFORE MEALS AND AT BEDTIME 200 strip 12   albuterol (VENTOLIN HFA) 108 (90 Base)  MCG/ACT inhaler TAKE 2 PUFFS BY MOUTH EVERY 6 HOURS AS NEEDED FOR WHEEZE OR SHORTNESS OF BREATH 8.5 each 2   estradiol (ESTRACE) 0.1 MG/GM vaginal cream Place 1 Applicatorful vaginally at bedtime. 42.5 g 0   ezetimibe (ZETIA) 10 MG tablet TAKE 1 TABLET BY MOUTH EVERY DAY 90 tablet 2   flavoxATE (URISPAS) 100 MG tablet Take 1 tablet (100 mg total) by mouth 2 (two) times daily as needed for bladder spasms. 60 tablet 11   fluticasone (FLONASE) 50 MCG/ACT nasal spray Place 2 sprays into both nostrils daily. 48 mL 3   HYDROcodone-acetaminophen (NORCO) 10-325 MG tablet Take 1 tablet by mouth every 6 (six) hours as needed for severe pain. Must last 30 days. 105 tablet 0   insulin glargine (LANTUS SOLOSTAR) 100 UNIT/ML Solostar Pen Inject 26 Units into the skin at bedtime.     insulin lispro (HUMALOG) 100 UNIT/ML KwikPen Inject 10-25 Units into the skin in the morning, at noon, and at bedtime.     Insulin Pen Needle (RELION PEN NEEDLE 31G/8MM) 31G X 8 MM MISC Use with insulin pen three times daily 100 each 12   levothyroxine (SYNTHROID) 88 MCG tablet Take 88 mcg by mouth daily.     lisinopril (ZESTRIL) 2.5 MG tablet Take 1 tablet (2.5 mg total) by mouth daily. 90 tablet 3   LORazepam (ATIVAN) 1 MG tablet Take 1 tablet (1 mg total) by mouth 2 (two) times daily as needed. 60 tablet 1   Magnesium 500 MG CAPS Take 1,000 mg by mouth daily.      metolazone (ZAROXOLYN) 2.5 MG tablet Take 1 tablet (2.5 mg total) by mouth as directed. 212 to 215 lbs metolazone 2.5 mg 30 min later take torsemide 40 mg, & weight > 215 lbs take metolazone 2.5 mg, 30 min later take torsemide 40 mg 90 tablet 3   metoprolol succinate (TOPROL-XL) 25 MG 24 hr tablet Take 25 mg by mouth as needed.     nitrofurantoin, macrocrystal-monohydrate, (MACROBID) 100 MG capsule Take 1 capsule (100 mg total) by mouth daily. 30 capsule 2   nitroGLYCERIN (NITROSTAT) 0.4 MG SL tablet PLACE 1 TABLET (0.4 MG TOTAL) UNDER THE TONGUE EVERY 5 (FIVE) MINUTES AS  NEEDED FOR CHEST PAIN. 25 tablet 1   NONFORMULARY OR COMPOUNDED ITEM See pharmacy note- Not to use on any open skin or wound. 120 each 3   potassium chloride SA (KLOR-CON) 20 MEQ tablet Take 1 tablet (20 mEq total) by mouth daily. 90 tablet 3   torsemide (DEMADEX) 20 MG tablet Take 2 tablets (40 mg total) by mouth daily. <214 lbs take 40 mg daily, 212 to 215 lbs metolazone 2.5 mg 30 min later take torsemide 40 mg, & weight > 215 lbs take metolazone 2.5 mg, 30 min later take torsemide 40 mg, then another dose torsemide 20 mg in the PM (2-3 pm) 200 tablet 4   warfarin (COUMADIN) 3 MG tablet Take 1 tablet (3 mg total) by mouth daily. 90 tablet 4   [START ON 01/18/2021] HYDROcodone-acetaminophen (NORCO) 10-325 MG tablet Take 1 tablet by mouth every 6 (six) hours as needed for severe pain. Must last 30 days. (Patient not taking: Reported on 01/04/2021) 105 tablet 0   pregabalin (LYRICA) 50 MG capsule Take 1 capsule (  50 mg total) by mouth at bedtime. (Patient not taking: Reported on 01/04/2021) 30 capsule 5   TRULICITY 1.5 IR/5.1OA SOPN Inject 1.5 mg into the skin once a week. (Patient not taking: Reported on 01/04/2021)     No current facility-administered medications for this visit.    Allergies:   Duloxetine, Other, Paroxetine, Sulfa antibiotics, Cephalexin, Clarithromycin, Diphenhydramine, Estrogens conjugated, Estrogens conjugated, Sulfonamide derivatives, and Nitrofurantoin   Social History:  The patient  reports that she has never smoked. She has never used smokeless tobacco. She reports that she does not drink alcohol and does not use drugs.   Family History:   family history includes Heart disease in her mother; Thyroid disease in her father.    Review of Systems: Review of Systems  Constitutional: Negative.   HENT: Negative.    Respiratory:  Positive for shortness of breath.   Cardiovascular:  Positive for leg swelling.  Gastrointestinal: Negative.   Musculoskeletal:  Positive for joint  pain.  Neurological: Negative.   Psychiatric/Behavioral: Negative.    All other systems reviewed and are negative.  PHYSICAL EXAM: VS:  BP 138/60 (BP Location: Left Arm, Patient Position: Sitting, Cuff Size: Large)   Pulse 81   Ht 5' 5.5" (1.664 m)   Wt 218 lb 2 oz (98.9 kg)   SpO2 95%   BMI 35.75 kg/m  , BMI Body mass index is 35.75 kg/m. Constitutional:  oriented to person, place, and time. No distress.  HENT:  Head: Grossly normal Eyes:  no discharge. No scleral icterus.  Neck: No JVD, no carotid bruits  Cardiovascular: Regular rate and rhythm, no murmurs appreciated Pulmonary/Chest: Clear to auscultation bilaterally, no wheezes or rails Abdominal: Soft.  no distension.  no tenderness.  Musculoskeletal: Normal range of motion Neurological:  normal muscle tone. Coordination normal. No atrophy Skin: Skin warm and dry Psychiatric: normal affect, pleasant  Recent Labs: 07/05/2020: B Natriuretic Peptide 202.1 07/16/2020: Hemoglobin 12.3; Platelets 320 12/21/2020: ALT 10; BUN 36; Creatinine, Ser 1.43; Potassium 4.6; Sodium 141    Lipid Panel Lab Results  Component Value Date   CHOL 172 01/17/2019   HDL 57 01/17/2019   LDLCALC 94 01/17/2019   TRIG 119 01/17/2019      Wt Readings from Last 3 Encounters:  01/04/21 218 lb 2 oz (98.9 kg)  12/21/20 214 lb 14.4 oz (97.5 kg)  12/14/20 218 lb (98.9 kg)     ASSESSMENT AND PLAN:  Acute on chronic diastolic and systolic CHF Prior COVID, possibly contributing to drop in ejection fraction Could not tolerate Entresto 24/26 mg twice daily, vision side effects Took her self off carvedilol Tolerating lisinopril 2.5 daily Recommend she increase lisinopril up to 5 daily, will aim for 10 daily Also recommend she start metoprolol succinate 12.5 daily For weight <211, take torsemide 40 mg daily For weight 212 to 215: Take metolazone 2.5 mg , 30 min later take torsemide 40 mg For weight >215: Take metolazone 2.5 mg , 30 min later take  torsemide 40 mg, and torsemide 20 mg in the PM (2-3 pm)  Cardiomyopathy ejection fraction 45% down to 35%, IVC dilated, moderately elevated right heart pressures Medication changes as above Will work with the lisinopril metoprolol which she seems to tolerate  Mixed  hyperlipidemia Continue Zetia Statin intolerance, no changes made  Essential hypertension  Increase lisinopril up to 5 daily, add metoprolol succinate 12.5 daily  Chronic atrial fibrillation (HCC)  A. fib likely contributing to CHF On warfarin,  Metoprolol succinate 12.5 daily, possible  titration to 25 if tolerated Aggressive torsemide regimen as detailed above  Obstructive sleep apnea Previously reported this was "not a major issue" Currently not on CPAP  Type 2 diabetes mellitus without complication, with long-term current use of insulin (HCC) Low carbohydrate diet recommended   Total encounter time more than 25 minutes  Greater than 50% was spent in counseling and coordination of care with the patient   Orders Placed This Encounter  Procedures   EKG 12-Lead      Signed, Esmond Plants, M.D., Ph.D. 01/04/2021  [facility] [place], [place]

## 2021-01-03 NOTE — Telephone Encounter (Signed)
Copied from [place] [id number]. Topic: Quick Communication - Rx Refill/Question >> Jan 03, 2021 11:16 AM [name] A wrote: Medication: fluticasone (FLONASE) 50 MCG/ACT nasal spray [[id number]]   Has the patient contacted their pharmacy? Yes.  The patient was directed to contact their PCP  (Agent: If no, request that the patient contact the pharmacy for the refill. If patient does not wish to contact the pharmacy document the reason why and proceed with request.) (Agent: If yes, when and what did the pharmacy advise?)  Preferred Pharmacy (with phone number or street name): [id number] [name], [place] [facility]  Phone:  [phone] Fax:  [phone]  Has the patient been seen for an appointment in the last year OR does the patient have an upcoming appointment? Yes.    Agent: Please be advised that RX refills may take up to 3 business days. We ask that you follow-up with your pharmacy.

## 2021-01-04 ENCOUNTER — Encounter: Payer: Self-pay | Admitting: Cardiovascular Disease

## 2021-01-04 ENCOUNTER — Other Ambulatory Visit: Payer: Self-pay

## 2021-01-04 ENCOUNTER — Ambulatory Visit: Payer: Medicare Other | Admitting: Cardiovascular Disease

## 2021-01-04 VITALS — BP 138/60 | HR 81 | Ht 65.5 in | Wt 218.1 lb

## 2021-01-04 DIAGNOSIS — R002 Palpitations: Secondary | ICD-10-CM

## 2021-01-04 DIAGNOSIS — I25118 Atherosclerotic heart disease of native coronary artery with other forms of angina pectoris: Secondary | ICD-10-CM

## 2021-01-04 DIAGNOSIS — I5042 Chronic combined systolic (congestive) and diastolic (congestive) heart failure: Secondary | ICD-10-CM | POA: Diagnosis not present

## 2021-01-04 DIAGNOSIS — I10 Essential (primary) hypertension: Secondary | ICD-10-CM | POA: Diagnosis not present

## 2021-01-04 DIAGNOSIS — I482 Chronic atrial fibrillation, unspecified: Secondary | ICD-10-CM | POA: Diagnosis not present

## 2021-01-04 DIAGNOSIS — R0602 Shortness of breath: Secondary | ICD-10-CM

## 2021-01-04 DIAGNOSIS — E785 Hyperlipidemia, unspecified: Secondary | ICD-10-CM

## 2021-01-04 MED ORDER — FLUTICASONE PROPIONATE 50 MCG/ACT NA SUSP: 2.0000 | Freq: Every day | NASAL | 3 refills | Status: DC

## 2021-01-04 MED ORDER — NITROGLYCERIN 0.4 MG SL SUBL: 0.4000 mg | SUBLINGUAL_TABLET | SUBLINGUAL | 1 refills | Status: DC | PRN

## 2021-01-04 MED ORDER — LISINOPRIL 5 MG PO TABS: 5.0000 mg | ORAL_TABLET | Freq: Every day | ORAL | 3 refills | Status: DC

## 2021-01-04 NOTE — Patient Instructions (Addendum)
Medication Instructions:  Please increase  lisinopril up to 5 mg daily   After a few weeks consider start metoprolol succinate 1/2 pill daily (12.5 mg daily)  Continue on the torsemide and metolazone as previously directed For weight <211 take torsemide 40 mg daily For weight 212 to 215 Take metolazone 2.5 mg , 30 min later take torsemide 40 mg For weight >215 Take metolazone 2.5 mg , 30 min later take torsemide 40 mg, and torsemide 20 mg in the PM (2-3 pm)  If you need a refill on your cardiac medications before your next appointment, please call your pharmacy.   Lab work: No new labs needed  Testing/Procedures: No new testing needed  Follow-Up: At [facility], you and your health needs are our priority.  As part of our continuing mission to provide you with exceptional heart care, we have created designated Provider Care Teams.  These Care Teams include your primary Cardiologist (physician) and Advanced Practice Providers (APPs -  Physician Assistants and Nurse Practitioners) who all work together to provide you with the care you need, when you need it.  You will need a follow up appointment in 6 months, APP ok  Providers on your designated Care Team:   Murray Hodgkins, NP Christell Faith, PA-C Cadence Kathlen Mody, [place]  COVID-19 Vaccine Information can be found at: [email or web address] For questions related to vaccine distribution or appointments, please email [email or web address] or call [phone].

## 2021-01-06 ENCOUNTER — Other Ambulatory Visit: Payer: Self-pay | Admitting: Family Medicine

## 2021-01-06 NOTE — Telephone Encounter (Signed)
Medication Refill - Medication: warfarin (COUMADIN) 4 MG tablet  Has the patient contacted their pharmacy? Yes.   They told her there were no refills on this 4 mg dosage. Preferred Pharmacy (with phone number or street name): [id number] [name], [address] AT [facility] Has the patient been seen for an appointment in the last year OR does the patient have an upcoming appointment? Yes.    Pt states she thought she had a refill, but they told her at pharmacy she did not. She states the dr put her on 4 mg this time, Pt only has dose for today and tomorrow and concerned she might run out.

## 2021-01-07 MED ORDER — WARFARIN SODIUM 3 MG PO TABS: 3.0000 mg | ORAL_TABLET | Freq: Every day | ORAL | 4 refills | Status: DC

## 2021-01-07 NOTE — Telephone Encounter (Signed)
Please review. Patient is requesting 4mg 

## 2021-01-07 NOTE — Telephone Encounter (Signed)
Patient states only has 1 pill left, needs temp supply sent to pharmacy in previous notes

## 2021-01-07 NOTE — Telephone Encounter (Signed)
Requested medication (s) are due for refill today: has current rx.  Requested medication (s) are on the active medication list: yes  Future visit scheduled: yes 04/13/21  Notes to clinic:  This medication can not be delegated, please assess.    Requested Prescriptions  Pending Prescriptions Disp Refills   warfarin (COUMADIN) 3 MG tablet 90 tablet 4    Sig: Take 1 tablet (3 mg total) by mouth daily.     Hematology:  Anticoagulants - warfarin Failed - 01/06/2021  2:39 PM      Failed - This refill cannot be delegated      Failed - If the patient is managed by [facility] - route to their Pool. If not, forward to the provider.      Passed - INR in normal range and within 30 days    INR  Date Value Ref Range Status  12/14/2020 2.1 2.0 - 3.0 Final  01/09/2020 1.4 (H) 0.8 - 1.2 Final    Comment:    (NOTE) INR goal varies based on device and disease states. Performed at [facility], [address] [place], [place] [address]   01/26/2014 3.5  Final    Comment:    INR reference interval applies to patients on anticoagulant therapy. A single INR therapeutic range for coumarins is not optimal for all indications; however, the suggested range for most indications is 2.0 - 3.0. Exceptions to the INR Reference Range may include: Prosthetic heart valves, acute myocardial infarction, prevention of myocardial infarction, and combinations of aspirin and anticoagulant. The need for a higher or lower target INR must be assessed individually. Reference: The Pharmacology and Management of the Vitamin K  antagonists: the seventh ACCP Conference on Antithrombotic and Thrombolytic Therapy. MGQQP.6195 Sept:126 (3suppl): [id number]. A HCT value >55% may artifactually increase the PT.  In one study,  the increase was an average of 25%. Reference:  "Effect on Routine and Special Coagulation Testing Values of Citrate Anticoagulant Adjustment in Patients with High HCT Values." American  Journal of Clinical Pathology 2006;126:400-405.           Passed - Valid encounter within last 3 months    Recent Outpatient Visits           2 weeks ago Acute cystitis without hematuria   [facility] [name], PA-C   3 weeks ago Anxiety, generalized   [facility] Jerrol Banana., MD   4 months ago [place] Jerrol Banana., MD   4 months ago Chronic combined systolic and diastolic congestive heart failure [facility])   [place] [facility] Jerrol Banana., MD   8 months ago Urinary frequency   [place] Just, Laurita Quint, FNP       Future Appointments             In 3 months Jerrol Banana., MD [facility]   In 5 months [name], Kathlene November, MD [facility]   In 9 months [place], Nicki Reaper, [place]

## 2021-01-07 NOTE — Telephone Encounter (Signed)
Pt called in and does not understand why the 4mg  has not been sent in, pt stated this is what Dr Rosanna Randy told her to, pt was beginning to become upset because she said Dr Rosanna Randy wants her on 4mg , not 3mg .

## 2021-01-07 NOTE — Telephone Encounter (Signed)
Call is waiting for provider review- patient has called back- reports she is going to run out of medication

## 2021-01-07 NOTE — Telephone Encounter (Signed)
Pt called back to report that she needs 4mg  instead of 3mg , she needs this done today she says because she has her last pill today. She needs 4 MG, same pharmacy

## 2021-01-10 ENCOUNTER — Other Ambulatory Visit: Payer: Self-pay | Admitting: Family Medicine

## 2021-01-10 DIAGNOSIS — I482 Chronic atrial fibrillation, unspecified: Secondary | ICD-10-CM

## 2021-01-10 MED ORDER — WARFARIN SODIUM 4 MG PO TABS: ORAL_TABLET | ORAL | 4 refills | Status: DC

## 2021-01-10 MED ORDER — WARFARIN SODIUM 4 MG PO TABS: 4.0000 mg | ORAL_TABLET | Freq: Every day | ORAL | 3 refills | Status: DC

## 2021-01-12 ENCOUNTER — Ambulatory Visit (INDEPENDENT_AMBULATORY_CARE_PROVIDER_SITE_OTHER): Payer: Medicare Other

## 2021-01-12 ENCOUNTER — Other Ambulatory Visit: Payer: Self-pay

## 2021-01-12 DIAGNOSIS — I482 Chronic atrial fibrillation, unspecified: Secondary | ICD-10-CM | POA: Diagnosis not present

## 2021-01-12 LAB — POCT INR
INR: 1.8 — AB (ref 2.0–3.0)
PT: 21.4

## 2021-01-12 NOTE — Patient Instructions (Signed)
Description   Continue 4 mg daily, except for 1.5 on Wednesday. Recheck in 1 week

## 2021-01-14 ENCOUNTER — Other Ambulatory Visit: Payer: Self-pay

## 2021-01-14 DIAGNOSIS — N179 Acute kidney failure, unspecified: Secondary | ICD-10-CM

## 2021-01-17 DIAGNOSIS — N179 Acute kidney failure, unspecified: Secondary | ICD-10-CM | POA: Diagnosis not present

## 2021-01-18 LAB — BASIC METABOLIC PANEL
BUN/Creatinine Ratio: 23 (ref 12–28)
BUN: 22 mg/dL (ref 8–27)
CO2: 23 mmol/L (ref 20–29)
Calcium: 9.3 mg/dL (ref 8.7–10.3)
Chloride: 103 mmol/L (ref 96–106)
Creatinine, Ser: 0.94 mg/dL (ref 0.57–1.00)
Glucose: 238 mg/dL — ABNORMAL HIGH (ref 70–99)
Potassium: 4.7 mmol/L (ref 3.5–5.2)
Sodium: 141 mmol/L (ref 134–144)
eGFR: 59 mL/min/{1.73_m2} — ABNORMAL LOW (ref >59)

## 2021-01-19 ENCOUNTER — Ambulatory Visit (INDEPENDENT_AMBULATORY_CARE_PROVIDER_SITE_OTHER): Payer: Medicare Other

## 2021-01-19 ENCOUNTER — Other Ambulatory Visit: Payer: Self-pay

## 2021-01-19 DIAGNOSIS — I482 Chronic atrial fibrillation, unspecified: Secondary | ICD-10-CM | POA: Diagnosis not present

## 2021-01-19 LAB — POCT INR
INR: 2.6 (ref 2.0–3.0)
PT: 31.6

## 2021-01-19 NOTE — Patient Instructions (Signed)
Description   Continue 4 mg daily. Recheck in 3 week

## 2021-01-27 ENCOUNTER — Other Ambulatory Visit: Payer: Self-pay

## 2021-01-27 MED ORDER — METOLAZONE 2.5 MG PO TABS: 2.5000 mg | ORAL_TABLET | ORAL | 0 refills | Status: DC

## 2021-01-27 NOTE — Telephone Encounter (Signed)
*  STAT* If patient is at the pharmacy, call can be transferred to refill team.   1. Which medications need to be refilled? (please list name of each medication and dose if known) Metolazone  2. Which pharmacy/location (including street and city if local pharmacy) is medication to be sent to? [address]  3. Do they need a 30 day or 90 day supply? [place]

## 2021-01-28 ENCOUNTER — Telehealth: Payer: Self-pay | Admitting: Cardiovascular Disease

## 2021-01-28 NOTE — Telephone Encounter (Signed)
Spoke with patient and she reports not feeling well. She reports some weakness and dizziness. Reviewed blood pressures are normal but she feels that they are low for her. Advised to continue monitoring, hydrate, and increase activity slowly. Instructed her to hold onto something when she changes positions as well. Will send to provider for any further recommendations but encouraged her try and stay active. She verbalized understanding with no further questions at this time.

## 2021-01-28 NOTE — Telephone Encounter (Signed)
Pt c/o BP issue: STAT if pt c/o blurred vision, one-sided weakness or slurred speech  1. What are your last 5 BP readings? 103/67, 111/69  2. Are you having any other symptoms (ex. Dizziness, headache, blurred vision, passed out)? Legs are weak  3. What is your BP issue? Pulse 89-93. Legs are weak below both knees

## 2021-02-09 ENCOUNTER — Other Ambulatory Visit: Payer: Self-pay

## 2021-02-09 ENCOUNTER — Ambulatory Visit (INDEPENDENT_AMBULATORY_CARE_PROVIDER_SITE_OTHER): Payer: Medicare Other

## 2021-02-09 DIAGNOSIS — I482 Chronic atrial fibrillation, unspecified: Secondary | ICD-10-CM

## 2021-02-09 LAB — POCT INR
INR: 2.2 (ref 2.0–3.0)
PT: 25.8

## 2021-02-09 NOTE — Patient Instructions (Signed)
Description   Continue 4 mg daily. Recheck in 4 week

## 2021-02-13 NOTE — Progress Notes (Signed)
PROVIDER NOTE: Information contained herein reflects review and annotations entered in association with encounter. Interpretation of such information and data should be left to medically-trained personnel. Information provided to patient can be located elsewhere in the medical record under "Patient Instructions". Document created using STT-dictation technology, any transcriptional errors that may result from process are unintentional.  °  °Patient: Tracy Mann  Service Category: E/M  Provider: Sharnika Binney A Airianna Kreischer, MD  °DOB: 02/22/1933  DOS: 02/14/2021  Specialty: Interventional Pain Management  °MRN: [id number]  Setting: Ambulatory outpatient  PCP: Gilbert, Richard L Jr., MD  °Type: Established Patient    Referring Provider: Gilbert, Richard L Jr.,*  °Location: Office  Delivery: Face-to-face    ° °HPI  °[name], a 87 y.o. year old female, is here today because of her Chronic pain syndrome [G89.4]. Tracy Mann's primary complain today is Leg Pain (both) °Last encounter: My last encounter with her was on 11/01/2020. °Pertinent problems: Tracy Mann has Cervical muscle strain; DDD (degenerative disc disease), lumbosacral; Chronic venous insufficiency; Chronic pain syndrome; Abnormal MRI, lumbar spine (08/22/2019); Lumbar facet hypertrophy (Multilevel) (Bilateral); Lumbar central spinal stenosis (SEVERE) (L3-4) with neurogenic claudication; Grade 1 Anterolisthesis of lumbar spine (L4/L5); Lumbar facet arthropathy (L3-4 right-sided facet edema/joint effusion); Lumbar lateral recess stenosis (L4-5) (Right); Chronic low back pain (3ry area of Pain) (Bilateral) w/ sciatica (Bilateral); Lumbosacral radiculopathy at L5 (Bilateral); Chronic lower extremity pain (1ry area of Pain) (Bilateral); Lumbosacral facet syndrome; Chronic hip pain (2ry area of Pain) (Bilateral) (R>L); Osteoarthritis involving multiple joints; Osteoarthritis of knee (Right); Osteoarthritis of facet joint of lumbar spine; Osteoarthritis of lumbar  spine; Bilateral lower extremity edema; Cellulitis of right lower leg; Chronic knee pain (Bilateral); Peripheral vascular disease (HCC) (lower extremity) (Bilateral); Cervicalgia; Chronic upper back pain; and Lumbosacral spondylosis with radiculopathy on their pertinent problem list. °Pain Assessment: Severity of Chronic pain is reported as a 2 /10. Location: Leg Left, Right/pain radiaties down both leg and feet. Onset: More than a month ago. Quality: Aching, Burning, Constant, Nagging, Discomfort, Sore, Throbbing. Timing: Constant. Modifying factor(s): Meds and resting. °Vitals:  height is 5' 6" (1.676 m) and weight is 213 lb (96.6 kg). Her temperature is 97.3 °F (36.3 °C) (abnormal). Her blood pressure is 152/77 (abnormal) and her pulse is 88. Her respiration is 15 and oxygen saturation is 91%.  ° °Reason for encounter: medication management.   The patient indicates doing well with the current medication regimen. No adverse reactions or side effects reported to the medications.  Her blood pressure and heart rate today are all over the place.  She indicates feeling weak and occasionally lightheaded.  I have instructed the patient to call her cardiologist to go in and be evaluated as soon as possible. ° °RTCB: 05/18/2021 ° °Pharmacotherapy Assessment  °Analgesic: Hydrocodone/APAP 10/325, 1 tab p.o. 4 times daily °MME/day: 40 mg/day  ° °Monitoring: °[place] PMP: PDMP reviewed during this encounter.       °Pharmacotherapy: No side-effects or adverse reactions reported. °Compliance: No problems identified. °Effectiveness: Clinically acceptable. ° °Brown, Sheila A, RN  02/14/2021  1:43 PM  Sign when Signing Visit °Nursing Pain Medication Assessment:  °Safety precautions to be maintained throughout the outpatient stay will include: orient to surroundings, keep bed in low position, maintain call bell within reach at all times, provide assistance with transfer out of bed and ambulation.  °Medication Inspection Compliance: Pill  count conducted under aseptic conditions, in front of the patient. Neither the pills nor the bottle was removed from   the patient's sight at any time. Once count was completed pills were immediately returned to the patient in their original bottle. ° °Medication: Hydrocodone/APAP °Pill/Patch Count:  125 of 105 pills remain °Pill/Patch Appearance: Markings consistent with prescribed medication °Bottle Appearance: Standard pharmacy container. Clearly labeled. °Filled Date: 12 / 21 / 2022 °Last Medication intake:  TodaySafety precautions to be maintained throughout the outpatient stay will include: orient to surroundings, keep bed in low position, maintain call bell within reach at all times, provide assistance with transfer out of bed and ambulation.  °   UDS:  °Summary  °Date Value Ref Range Status  °03/10/2020 Note  Final  °  Comment:  °  ==================================================================== °ToxASSURE Select 13 (MW) °==================================================================== °Test                             Result       Flag       Units ° °Drug Present °  Lorazepam                      >1205                   ng/mg creat °   Source of lorazepam is a scheduled prescription medication. ° °  Hydrocodone                    1742                    ng/mg creat °  Hydromorphone                  209                     ng/mg creat °  Dihydrocodeine                 576                     ng/mg creat °  Norhydrocodone                 >3012                   ng/mg creat °   Sources of hydrocodone include scheduled prescription medications. °   Hydromorphone, dihydrocodeine and norhydrocodone are expected °   metabolites of hydrocodone. Hydromorphone and dihydrocodeine are °   also available as scheduled prescription medications. ° °==================================================================== °Test                      Result    Flag   Units      Ref Range °  Creatinine              166               mg/dL      >=20 °==================================================================== °Declared Medications: ° Medication list was not provided. °==================================================================== °For clinical consultation, please call [phone]. °==================================================================== °  °  ° °ROS  °Constitutional: Denies any fever or chills °Gastrointestinal: No reported hemesis, hematochezia, vomiting, or acute GI distress °Musculoskeletal: Denies any acute onset joint swelling, redness, loss of ROM, or weakness °Neurological: No reported episodes of acute onset apraxia, aphasia, dysarthria, agnosia, amnesia, paralysis, loss of coordination, or loss of consciousness ° °Medication Review  °HYDROcodone-acetaminophen, Insulin Pen Needle, LORazepam, Magnesium, NONFORMULARY OR COMPOUNDED ITEM,   albuterol, estradiol, ezetimibe, flavoxATE, fluticasone, glucose blood, insulin glargine, insulin lispro, levothyroxine, lisinopril, metolazone, metoprolol succinate, nitroGLYCERIN, nitrofurantoin (macrocrystal-monohydrate), potassium chloride SA, rOPINIRole, torsemide, and warfarin ° °History Review  °Allergy: Tracy Mann is allergic to duloxetine, other, paroxetine, sulfa antibiotics, cephalexin, clarithromycin, diphenhydramine, estrogens conjugated, estrogens conjugated, sulfonamide derivatives, and nitrofurantoin. °Drug: Tracy Mann  reports no history of drug use. °Alcohol:  reports no history of alcohol use. °Tobacco:  reports that she has never smoked. She has never used smokeless tobacco. °Social: Tracy Mann  reports that she has never smoked. She has never used smokeless tobacco. She reports that she does not drink alcohol and does not use drugs. °Medical:  has a past medical history of Acute myocardial infarction of inferior wall (HCC), Arthritis, Chronic anticoagulation, Chronic atrial fibrillation (HCC), Chronic combined systolic and diastolic CHF (congestive heart  failure) (HCC), Essential hypertension, Hyperlipidemia, mixed, Mixed Ischemic/Nonischemic Cardiomyopathy, Nonobstructive coronary artery disease, Obesity, Thyroid disease, Type II diabetes mellitus (HCC), Venous insufficiency, and Vitamin D deficiency. °Surgical: Tracy Mann  has a past surgical history that includes Vesicovaginal fistula closure w/ TAH (1996); Tubal ligation (1968); Cholecystectomy (1999); and Cardiac catheterization (11/2012). °Family: family history includes Heart disease in her mother; Thyroid disease in her father. ° °Laboratory Chemistry Profile  ° °Renal °Lab Results  °Component Value Date  ° BUN 22 01/17/2021  ° CREATININE 0.94 01/17/2021  ° BCR 23 01/17/2021  ° GFRAA 73 12/31/2019  ° GFRNONAA 43 (L) 07/05/2020  °  Hepatic °Lab Results  °Component Value Date  ° AST 23 12/21/2020  ° ALT 10 12/21/2020  ° ALBUMIN 4.1 12/21/2020  ° ALKPHOS 128 (H) 12/21/2020  °  °Electrolytes °Lab Results  °Component Value Date  ° NA 141 01/17/2021  ° K 4.7 01/17/2021  ° CL 103 01/17/2021  ° CALCIUM 9.3 01/17/2021  ° MG 2.1 11/26/2019  ° PHOS 3.9 09/08/2014  °  Bone °Lab Results  °Component Value Date  ° 25OHVITD1 43 11/26/2019  ° 25OHVITD2 9.2 11/26/2019  ° 25OHVITD3 34 11/26/2019  °  °Inflammation (CRP: Acute Phase) (ESR: Chronic Phase) °Lab Results  °Component Value Date  ° CRP 4 11/26/2019  ° ESRSEDRATE 45 (H) 11/26/2019  ° LATICACIDVEN 1.4 01/09/2020  °    °  ° °Note: Above Lab results reviewed. ° °Recent Imaging Review  °DG PAIN CLINIC C-ARM 1-60 MIN NO REPORT °Fluoro was used, but no Radiologist interpretation will be provided.  °Please refer to "NOTES" tab for provider progress note. °Note: Reviewed       ° °Physical Exam  °General appearance: Well nourished, well developed, and well hydrated. In no apparent acute distress °Mental status: Alert, oriented x 3 (person, place, & time)       °Respiratory: No evidence of acute respiratory distress °Eyes: PERLA °Vitals: BP (!) 152/77    Pulse 88    Temp (!) 97.3  °F (36.3 °C)    Resp 15    Ht 5' 6" (1.676 m)    Wt 213 lb (96.6 kg)    SpO2 91%    BMI 34.38 kg/m²  °BMI: Estimated body mass index is 34.38 kg/m² as calculated from the following: °  Height as of this encounter: 5' 6" (1.676 m). °  Weight as of this encounter: 213 lb (96.6 kg). °Ideal: Ideal body weight: 59.3 kg (130 lb 11.7 oz) °Adjusted ideal body weight: 74.2 kg (163 lb 10.2 oz) ° °Assessment  ° °Status Diagnosis  °Controlled °Controlled °Controlled 1. Chronic pain syndrome   °2. Chronic   lower extremity pain (1ry area of Pain) (Bilateral)   °3. Chronic hip pain (2ry area of Pain) (Bilateral) (R>L)   °4. Chronic low back pain (3ry area of Pain) (Bilateral) w/ sciatica (Bilateral)   °5. Chronic knee pain (Bilateral)   °6. Chronic upper back pain   °7. Lumbosacral facet syndrome   °8. Pharmacologic therapy   °9. Chronic use of opiate for therapeutic purpose   °10. Encounter for chronic pain management   °11. Encounter for medication management   °12. Labile blood pressure   °  ° °Updated Problems: °Problem  °Labile Blood Pressure  ° ° °Plan of Care  °Problem-specific:  °No problem-specific Assessment & Plan notes found for this encounter. ° °Tracy Mann has a current medication list which includes the following long-term medication(s): albuterol, ezetimibe, fluticasone, [START ON 02/17/2021] hydrocodone-acetaminophen, [START ON 03/19/2021] hydrocodone-acetaminophen, [START ON 04/18/2021] hydrocodone-acetaminophen, insulin lispro, levothyroxine, lisinopril, metolazone, nitroglycerin, potassium chloride sa, warfarin, torsemide, and [DISCONTINUED] ropinirole. ° °Pharmacotherapy (Medications Ordered): °Meds ordered this encounter  °Medications  ° HYDROcodone-acetaminophen (NORCO) 10-325 MG tablet  °  Sig: Take 1 tablet by mouth every 6 (six) hours as needed for severe pain. Must last 30 days.  °  Dispense:  105 tablet  °  Refill:  0  °  DO NOT: delete (not duplicate); no partial-fill (will deny script to  complete), no refill request (F/U required). DISPENSE: 1 day early if closed on fill date. WARN: No CNS-depressants within 8 hrs of med.  ° HYDROcodone-acetaminophen (NORCO) 10-325 MG tablet  °  Sig: Take 1 tablet by mouth every 6 (six) hours as needed for severe pain. Must last 30 days.  °  Dispense:  105 tablet  °  Refill:  0  °  DO NOT: delete (not duplicate); no partial-fill (will deny script to complete), no refill request (F/U required). DISPENSE: 1 day early if closed on fill date. WARN: No CNS-depressants within 8 hrs of med.  ° HYDROcodone-acetaminophen (NORCO) 10-325 MG tablet  °  Sig: Take 1 tablet by mouth every 6 (six) hours as needed for severe pain. Must last 30 days.  °  Dispense:  105 tablet  °  Refill:  0  °  DO NOT: delete (not duplicate); no partial-fill (will deny script to complete), no refill request (F/U required). DISPENSE: 1 day early if closed on fill date. WARN: No CNS-depressants within 8 hrs of med.  ° °Orders:  °No orders of the defined types were placed in this encounter. ° °Follow-up plan:   °Return in about 3 months (around 05/18/2021) for Eval-day (M,W), (F2F), (MM).   °  °Interventional Therapies  °Risk   Complexity Considerations:   °Estimated body mass index is 35.73 kg/m² as calculated from the following: °  Height as of this encounter: 5' 5.5" (1.664 m). °  Weight as of this encounter: 218 lb (98.9 kg). °NOTE: COUMADIN Anticoagulation (Stop: 5 days   Restart: 2 hours) °Decreased GFR  ° °Planned   Pending:   °  ° °Under consideration:   °Diagnostic midline caudal ESI #1 + diagnostic epidurogram  °Diagnostic bilateral IA hip joint injection #1  °Diagnostic right L3-4 LESI #1  °Diagnostic right L4 TFESI #1  °Diagnostic bilateral L3 TFESI #1  °Diagnostic bilateral lumbar facet MBB #1   ° °Completed:   °Diagnostic/therapeutic midline L4-5 LESI x1 (09/28/2020) (100/100/100/50)   ° °Completed by Dr. Morales ([facility]):   °Diagnostic bilateral L4 TFESI x1 (09/12/2019 - Dr. Morales [[facility]])   ° °  Therapeutic   Palliative (PRN) options:   None established    Recent Visits No visits were found meeting these conditions. Showing recent visits within past 90 days and meeting all other requirements Today's Visits Date Type Provider Dept  02/14/21 Office Visit Milinda Pointer, MD [facility]  Showing today's visits and meeting all other requirements Future Appointments No visits were found meeting these conditions. Showing future appointments within next 90 days and meeting all other requirements  I discussed the assessment and treatment plan with the patient. The patient was provided an opportunity to ask questions and all were answered. The patient agreed with the plan and demonstrated an understanding of the instructions.  Patient advised to call back or seek an in-person evaluation if the symptoms or condition worsens.  Duration of encounter: 30 minutes.  Note by: Gaspar Cola, MD Date: 02/14/2021; Time: 2:05 PM

## 2021-02-14 ENCOUNTER — Encounter: Payer: Self-pay | Admitting: Pain Medicine

## 2021-02-14 ENCOUNTER — Ambulatory Visit: Payer: Medicare Other | Attending: Pain Medicine | Admitting: Pain Medicine

## 2021-02-14 ENCOUNTER — Other Ambulatory Visit: Payer: Self-pay

## 2021-02-14 ENCOUNTER — Telehealth: Payer: Self-pay | Admitting: Cardiovascular Disease

## 2021-02-14 VITALS — BP 152/77 | HR 88 | Temp 97.3°F | Resp 15 | Ht 66.0 in | Wt 213.0 lb

## 2021-02-14 DIAGNOSIS — Z79899 Other long term (current) drug therapy: Secondary | ICD-10-CM

## 2021-02-14 DIAGNOSIS — M25561 Pain in right knee: Secondary | ICD-10-CM | POA: Insufficient documentation

## 2021-02-14 DIAGNOSIS — M5441 Lumbago with sciatica, right side: Secondary | ICD-10-CM | POA: Diagnosis present

## 2021-02-14 DIAGNOSIS — M25551 Pain in right hip: Secondary | ICD-10-CM | POA: Diagnosis present

## 2021-02-14 DIAGNOSIS — M79605 Pain in left leg: Secondary | ICD-10-CM | POA: Diagnosis present

## 2021-02-14 DIAGNOSIS — G8929 Other chronic pain: Secondary | ICD-10-CM | POA: Diagnosis present

## 2021-02-14 DIAGNOSIS — Z79891 Long term (current) use of opiate analgesic: Secondary | ICD-10-CM | POA: Diagnosis present

## 2021-02-14 DIAGNOSIS — M47817 Spondylosis without myelopathy or radiculopathy, lumbosacral region: Secondary | ICD-10-CM

## 2021-02-14 DIAGNOSIS — M79604 Pain in right leg: Secondary | ICD-10-CM | POA: Insufficient documentation

## 2021-02-14 DIAGNOSIS — G894 Chronic pain syndrome: Secondary | ICD-10-CM

## 2021-02-14 DIAGNOSIS — R0989 Other specified symptoms and signs involving the circulatory and respiratory systems: Secondary | ICD-10-CM

## 2021-02-14 DIAGNOSIS — M5442 Lumbago with sciatica, left side: Secondary | ICD-10-CM | POA: Insufficient documentation

## 2021-02-14 DIAGNOSIS — M549 Dorsalgia, unspecified: Secondary | ICD-10-CM | POA: Insufficient documentation

## 2021-02-14 DIAGNOSIS — M25562 Pain in left knee: Secondary | ICD-10-CM | POA: Diagnosis present

## 2021-02-14 DIAGNOSIS — M25552 Pain in left hip: Secondary | ICD-10-CM | POA: Diagnosis present

## 2021-02-14 MED ORDER — HYDROCODONE-ACETAMINOPHEN 10-325 MG PO TABS: 1.0000 | ORAL_TABLET | Freq: Four times a day (QID) | ORAL | 0 refills | Status: DC | PRN

## 2021-02-14 NOTE — Progress Notes (Signed)
Nursing Pain Medication Assessment:  Safety precautions to be maintained throughout the outpatient stay will include: orient to surroundings, keep bed in low position, maintain call bell within reach at all times, provide assistance with transfer out of bed and ambulation.  Medication Inspection Compliance: Pill count conducted under aseptic conditions, in front of the patient. Neither the pills nor the bottle was removed from the patient's sight at any time. Once count was completed pills were immediately returned to the patient in their original bottle.  Medication: Hydrocodone/APAP Pill/Patch Count:  125 of 105 pills remain Pill/Patch Appearance: Markings consistent with prescribed medication Bottle Appearance: Standard pharmacy container. Clearly labeled. Filled Date: 65 / 21 / 2022 Last Medication intake:  TodaySafety precautions to be maintained throughout the outpatient stay will include: orient to surroundings, keep bed in low position, maintain call bell within reach at all times, provide assistance with transfer out of bed and ambulation.

## 2021-02-14 NOTE — Telephone Encounter (Signed)
Completed [place] parking pass for disability Dr. Rockey Situ singed form for renewal  Mrs. Maravilla called to let know the form completed and up front at the desk for her to pick up at her convenience.  Mrs. Smithey thankful for call and form completed in timely manner.

## 2021-02-14 NOTE — Patient Instructions (Signed)

## 2021-02-14 NOTE — Telephone Encounter (Signed)
Patient came by office Dropped off Disability Parking Placard form to be completed Placed in nurse box

## 2021-02-17 ENCOUNTER — Other Ambulatory Visit: Payer: Self-pay

## 2021-02-17 ENCOUNTER — Encounter: Payer: Self-pay | Admitting: Cardiovascular Disease

## 2021-02-17 ENCOUNTER — Ambulatory Visit: Payer: Medicare Other | Admitting: Cardiovascular Disease

## 2021-02-17 ENCOUNTER — Other Ambulatory Visit: Payer: Self-pay | Admitting: Pain Medicine

## 2021-02-17 ENCOUNTER — Telehealth: Payer: Self-pay | Admitting: Pain Medicine

## 2021-02-17 VITALS — BP 142/80 | HR 84 | Ht 65.5 in | Wt 222.1 lb

## 2021-02-17 DIAGNOSIS — Z79891 Long term (current) use of opiate analgesic: Secondary | ICD-10-CM

## 2021-02-17 DIAGNOSIS — M25561 Pain in right knee: Secondary | ICD-10-CM

## 2021-02-17 DIAGNOSIS — M5442 Lumbago with sciatica, left side: Secondary | ICD-10-CM

## 2021-02-17 DIAGNOSIS — M549 Dorsalgia, unspecified: Secondary | ICD-10-CM

## 2021-02-17 DIAGNOSIS — I10 Essential (primary) hypertension: Secondary | ICD-10-CM

## 2021-02-17 DIAGNOSIS — M25552 Pain in left hip: Secondary | ICD-10-CM

## 2021-02-17 DIAGNOSIS — G8929 Other chronic pain: Secondary | ICD-10-CM

## 2021-02-17 DIAGNOSIS — G894 Chronic pain syndrome: Secondary | ICD-10-CM

## 2021-02-17 DIAGNOSIS — M47817 Spondylosis without myelopathy or radiculopathy, lumbosacral region: Secondary | ICD-10-CM

## 2021-02-17 DIAGNOSIS — Z79899 Other long term (current) drug therapy: Secondary | ICD-10-CM

## 2021-02-17 DIAGNOSIS — M79604 Pain in right leg: Secondary | ICD-10-CM

## 2021-02-17 MED ORDER — LISINOPRIL 10 MG PO TABS: 10.0000 mg | ORAL_TABLET | Freq: Every day | ORAL | 3 refills | Status: DC

## 2021-02-17 MED ORDER — HYDROCODONE-ACETAMINOPHEN 10-325 MG PO TABS: 1.0000 | ORAL_TABLET | Freq: Four times a day (QID) | ORAL | 0 refills | Status: DC | PRN

## 2021-02-17 MED ORDER — METOLAZONE 2.5 MG PO TABS: 2.5000 mg | ORAL_TABLET | ORAL | 1 refills | Status: DC

## 2021-02-17 MED ORDER — TORSEMIDE 20 MG PO TABS: 40.0000 mg | ORAL_TABLET | Freq: Every day | ORAL | 4 refills | Status: DC

## 2021-02-17 MED ORDER — METOPROLOL SUCCINATE ER 25 MG PO TB24: 25.0000 mg | ORAL_TABLET | Freq: Every day | ORAL | 3 refills | Status: DC

## 2021-02-17 NOTE — Patient Instructions (Addendum)
Medication Instructions:  Please take whole metoprolol 25 daily Please increase the lisinopril up to 10 mg daily  Take metolazone daily with torsemide 40 mg daily for three days Torsemide 40 mg daily take metolazone 2x a week with torsemide 40 daily  If you need a refill on your cardiac medications before your next appointment, please call your pharmacy.   Lab work: No new labs needed  Testing/Procedures: No new testing needed  Follow-Up: At [facility], you and your health needs are our priority.  As part of our continuing mission to provide you with exceptional heart care, we have created designated Provider Care Teams.  These Care Teams include your primary Cardiologist (physician) and Advanced Practice Providers (APPs -  Physician Assistants and Nurse Practitioners) who all work together to provide you with the care you need, when you need it.  You will need a follow up appointment in 3 months  Providers on your designated Care Team:   Murray Hodgkins, NP Christell Faith, PA-C Cadence Kathlen Mody, [place]  COVID-19 Vaccine Information can be found at: [email or web address] For questions related to vaccine distribution or appointments, please email [email or web address] or call [phone].

## 2021-02-17 NOTE — Progress Notes (Signed)
Cardiology Office Note  Date:  02/17/2021   ID:  Carlesha, Seiple May 31, 1933, MRN [id number]  PCP:  Jerrol Banana., MD   Chief Complaint  Patient presents with   Hypertension    Patient c/o fluctuating blood pressure, shortness of breath, weakness, A-Fib and leg weakness. Medications reviewed by the patient verbally.     HPI:  [name] is a 88 year old woman with a PMH significant for  chronic atrial fibrillation on Coumadin therapy,  diastolic and systolic dysfunction, EF 40 to 45% on echo 01/2018  hypertension,  hyperlipidemia,  morbid obesity,   moderate pulmonary hypertension inferior wall myocardial infarction felt to be secondary to a thrombus from atrial fib in October 2007 at [facility],  episodes of chest pain  Ejection fraction 30 to 35%, moderately elevated right heart pressures, July 16, 2020 following COVID Medication intolerances who presents for followup Of her coronary artery disease and leg edema.  LOV 12/2020  Felt well dec 2022, at that time was walking, shopping, leg swelling was stable, had mild shortness of breath  Through the holidays reports she has had worsening symptoms Increased swelling,  Higher blood pressure SOB worse Weight higher  Reports she is only taking torsemide 20 mg daily, metolazone twice a week Weight today 222 pounds  Prior diuretic titrating scale given to her on prior office visit as below For weight >214: Take metolazone 2.5 mg , 30 min later take torsemide 40 mg For weight 211 to 214: take torsemide 40 mg daily For weight <211, take torsemide 20 mg daily  Normal renal function on recent lab work A1c 8.3  Reports she is taking metoprolol succinate 12 daily with lisinopril 5 Many of her pressures 1 49-4 60 systolic  Sedentary, no regular exercise  EKG personally reviewed by myself on todays visit Atrial fib interventricular conduction delay, left anterior fascicular block rate 84 bpm  Other  past medical history reviewed Previous attempt to use Carvedilol 3.25 twice daily started, Entresto 24/26 unsuccessful secondary to intolerance Went back on lisinopril, stopped the coreg  COVID infection early 2022  Echocardiogram July 16, 2020   1. Left ventricular ejection fraction, by estimation, is 30 to 35%. The  left ventricle has moderately decreased function. The left ventricle  demonstrates global hypokinesis. The left ventricular internal cavity size  was moderately dilated. Left  ventricular diastolic parameters are indeterminate.   2. Right ventricular systolic function is mildly reduced. The right  ventricular size is mildly enlarged. There is moderately elevated  pulmonary artery systolic pressure. The estimated right ventricular  systolic pressure is 49.6 mmHg.   3. Left atrial size was moderately dilated.   4. The mitral valve is normal in structure. Moderate mitral valve  regurgitation.   5. The inferior vena cava is dilated in size with <50% respiratory  variability, suggesting right atrial pressure of 15 mmHg.   Does not use her lymphedema compression pumps  HBA1C 9.1 on labs 12/2018 HBA1C 8.3 on 05/20/2019  PMH:   has a past medical history of Acute myocardial infarction of inferior wall ([place]), Arthritis, Chronic anticoagulation, Chronic atrial fibrillation ([place]), Chronic combined systolic and diastolic CHF (congestive heart failure) ([place]), Essential hypertension, Hyperlipidemia, mixed, Mixed Ischemic/Nonischemic Cardiomyopathy, Nonobstructive coronary artery disease, Obesity, Thyroid disease, Type II diabetes mellitus ([place]), Venous insufficiency, and Vitamin D deficiency.  PSH:    Past Surgical History:  Procedure Laterality Date   CARDIAC CATHETERIZATION  11/2012   [facility];EF 45-50%   CHOLECYSTECTOMY  [place]   VESICOVAGINAL FISTULA CLOSURE W/ TAH  1996    Current Outpatient Medications  Medication Sig Dispense Refill   ACCU-CHEK AVIVA  PLUS test strip USE WITH METER TO CHECK GLUCOSE BEFORE MEALS AND AT BEDTIME 200 strip 12   albuterol (VENTOLIN HFA) 108 (90 Base) MCG/ACT inhaler TAKE 2 PUFFS BY MOUTH EVERY 6 HOURS AS NEEDED FOR WHEEZE OR SHORTNESS OF BREATH 8.5 each 2   estradiol (ESTRACE) 0.1 MG/GM vaginal cream Place 1 Applicatorful vaginally at bedtime. 42.5 g 0   ezetimibe (ZETIA) 10 MG tablet TAKE 1 TABLET BY MOUTH EVERY DAY 90 tablet 2   flavoxATE (URISPAS) 100 MG tablet Take 1 tablet (100 mg total) by mouth 2 (two) times daily as needed for bladder spasms. 60 tablet 11   fluticasone (FLONASE) 50 MCG/ACT nasal spray Place 2 sprays into both nostrils daily. 48 mL 3   HYDROcodone-acetaminophen (NORCO) 10-325 MG tablet Take 1 tablet by mouth every 6 (six) hours as needed for severe pain. Must last 30 days. 105 tablet 0   [START ON 04/18/2021] HYDROcodone-acetaminophen (NORCO) 10-325 MG tablet Take 1 tablet by mouth every 6 (six) hours as needed for severe pain. Must last 30 days. 105 tablet 0   insulin glargine (LANTUS SOLOSTAR) 100 UNIT/ML Solostar Pen Inject 26 Units into the skin at bedtime.     insulin lispro (HUMALOG) 100 UNIT/ML KwikPen Inject 10-25 Units into the skin in the morning, at noon, and at bedtime.     Insulin Pen Needle (RELION PEN NEEDLE 31G/8MM) 31G X 8 MM MISC Use with insulin pen three times daily 100 each 12   levothyroxine (SYNTHROID) 88 MCG tablet Take 88 mcg by mouth daily.     lisinopril (ZESTRIL) 5 MG tablet Take 1 tablet (5 mg total) by mouth daily. 90 tablet 3   LORazepam (ATIVAN) 1 MG tablet Take 1 tablet (1 mg total) by mouth 2 (two) times daily as needed. 60 tablet 1   Magnesium 500 MG CAPS Take 1,000 mg by mouth daily.      metolazone (ZAROXOLYN) 2.5 MG tablet Take 1 tablet (2.5 mg total) by mouth as directed. 212 to 215 lbs metolazone 2.5 mg 30 min later take torsemide 40 mg, & weight > 215 lbs take metolazone 2.5 mg, 30 min later take torsemide 40 mg 90 tablet 0   metoprolol succinate  (TOPROL-XL) 25 MG 24 hr tablet Take 25 mg by mouth as needed.     nitrofurantoin, macrocrystal-monohydrate, (MACROBID) 100 MG capsule Take 1 capsule (100 mg total) by mouth daily. 30 capsule 2   nitroGLYCERIN (NITROSTAT) 0.4 MG SL tablet Place 1 tablet (0.4 mg total) under the tongue every 5 (five) minutes as needed for chest pain. 25 tablet 1   NONFORMULARY OR COMPOUNDED ITEM See pharmacy note- Not to use on any open skin or wound. 120 each 3   potassium chloride SA (KLOR-CON) 20 MEQ tablet Take 1 tablet (20 mEq total) by mouth daily. 90 tablet 3   torsemide (DEMADEX) 20 MG tablet Take 2 tablets (40 mg total) by mouth daily. <214 lbs take 40 mg daily, 212 to 215 lbs metolazone 2.5 mg 30 min later take torsemide 40 mg, & weight > 215 lbs take metolazone 2.5 mg, 30 min later take torsemide 40 mg, then another dose torsemide 20 mg in the PM (2-3 pm) 200 tablet 4   warfarin (COUMADIN) 4 MG tablet Take daily or as directed by physician 90 tablet  4   [START ON 03/19/2021] HYDROcodone-acetaminophen (NORCO) 10-325 MG tablet Take 1 tablet by mouth every 6 (six) hours as needed for severe pain. Must last 30 days. (Patient not taking: Reported on 02/17/2021) 105 tablet 0   No current facility-administered medications for this visit.    Allergies:   Duloxetine, Other, Paroxetine, Sulfa antibiotics, Cephalexin, Clarithromycin, Diphenhydramine, Estrogens conjugated, Estrogens conjugated, Sulfonamide derivatives, and Nitrofurantoin   Social History:  The patient  reports that she has never smoked. She has never used smokeless tobacco. She reports that she does not drink alcohol and does not use drugs.   Family History:   family history includes Heart disease in her mother; Thyroid disease in her father.    Review of Systems: Review of Systems  Constitutional: Negative.        Weight gain  HENT: Negative.    Respiratory:  Positive for shortness of breath.   Cardiovascular:  Positive for leg swelling.   Gastrointestinal: Negative.   Musculoskeletal:  Positive for joint pain.  Neurological: Negative.   Psychiatric/Behavioral: Negative.    All other systems reviewed and are negative.  PHYSICAL EXAM: VS:  BP (!) 142/80 (BP Location: Left Arm, Patient Position: Sitting, Cuff Size: Large)    Pulse 84    Ht 5' 5.5" (1.664 m)    Wt 222 lb 2 oz (100.8 kg)    SpO2 96%    BMI 36.40 kg/m  , BMI Body mass index is 36.4 kg/m. Constitutional:  oriented to person, place, and time. No distress.  HENT:  Head: Grossly normal Eyes:  no discharge. No scleral icterus.  Neck: No JVD, no carotid bruits  Cardiovascular: Regular rate and rhythm, no murmurs appreciated Pulmonary/Chest: Clear to auscultation bilaterally, no wheezes or rails Abdominal: Soft.  no distension.  no tenderness.  Musculoskeletal: Normal range of motion Neurological:  normal muscle tone. Coordination normal. No atrophy Skin: Skin warm and dry Psychiatric: normal affect, pleasant  Recent Labs: 07/05/2020: B Natriuretic Peptide 202.1 07/16/2020: Hemoglobin 12.3; Platelets 320 12/21/2020: ALT 10 01/17/2021: BUN 22; Creatinine, Ser 0.94; Potassium 4.7; Sodium 141    Lipid Panel   Wt Readings from Last 3 Encounters:  02/17/21 222 lb 2 oz (100.8 kg)  02/14/21 213 lb (96.6 kg)  01/04/21 218 lb 2 oz (98.9 kg)     ASSESSMENT AND PLAN:  Acute on chronic diastolic and systolic CHF Prior COVID, possibly contributing to drop in ejection fraction Could not tolerate Entresto 24/26 mg twice daily, vision side effects Took her self off carvedilol -Blood pressure elevated on today's visit recommended she increase metoprolol succinate up to 25 lisinopril up to 10 --Weight higher, Recommend she stick with the more aggressive diuretic regiment Previously recommended to take much higher dose torsemide with metolazone for weight over 215 pounds She has not been checking her weight at home and has not been titrating the diuretic --Recommend  she take metolazone 2.5 mg daily with torsemide 40 daily for the next 3 days then down to torsemide 40 daily.  Up until now she has been taking torsemide 20 daily with very sparing metolazone use -Recommend daily weights, moderate salt and fluid intake  Cardiomyopathy ejection fraction 45% down to 35%,  Medication changes as above for acute on chronic diastolic and systolic CHF CHF education continued  Mixed  hyperlipidemia Continue Zetia Statin intolerance, no changes made  Essential hypertension  Lisinopril up to 10 daily metoprolol succinate up to 25 daily  Chronic atrial fibrillation (HCC)  A. fib  contributing to CHF On warfarin,  Higher dose metoprolol as above For continued tachypalpitations symptoms may need metoprolol succinate 37.5 mg daily  Obstructive sleep apnea Previously reported this was "not a major issue" Currently not on CPAP May need to be retested  Type 2 diabetes mellitus without complication, with long-term current use of insulin (HCC) Low carbohydrate diet recommended   Total encounter time more than 35 minutes  Greater than 50% was spent in counseling and coordination of care with the patient   No orders of the defined types were placed in this encounter.     Signed, Esmond Plants, M.D., Ph.D. 02/17/2021  [place], [place]

## 2021-02-17 NOTE — Telephone Encounter (Signed)
Talked with Dr Lowella Dandy about patient needing medication sent to [facility] on [address]. Called patient and notified her/

## 2021-02-17 NOTE — Progress Notes (Signed)
The patient regular pharmacy [facility]) did not have the medicine and it had to be resent to [place].

## 2021-02-22 ENCOUNTER — Other Ambulatory Visit: Payer: Self-pay | Admitting: Family Medicine

## 2021-02-22 NOTE — Telephone Encounter (Signed)
Medication Refill - Medication: LORazepam (ATIVAN) 1 MG tablet. Patient has a few pills left but was advised to call a few days prior to her running out  Has the patient contacted their pharmacy? Yes.    (Agent: If yes, when and what did the pharmacy advise?) No refills and contact PCP office.   Preferred Pharmacy (with phone number or street name):  [facility]),  - [address] Phone:  [phone]  Fax:  [phone]      Has the patient been seen for an appointment in the last year OR does the patient have an upcoming appointment? Yes.    Agent: Please be advised that RX refills may take up to 3 business days. We ask that you follow-up with your pharmacy.

## 2021-02-22 NOTE — Telephone Encounter (Signed)
Requested medication (s) are due for refill today:   Provider to review  Requested medication (s) are on the active medication list:   Yes  Future visit scheduled:   Yes   Last ordered: 12/07/2020 #60, 1 refill  Returned because it's a non delegated refill    Requested Prescriptions  Pending Prescriptions Disp Refills   LORazepam (ATIVAN) 1 MG tablet 60 tablet 1    Sig: Take 1 tablet (1 mg total) by mouth 2 (two) times daily as needed.     Not Delegated - Psychiatry:  Anxiolytics/Hypnotics Failed - 02/22/2021 12:54 PM      Failed - This refill cannot be delegated      Failed - Urine Drug Screen completed in last 360 days      Passed - Valid encounter within last 6 months    Recent Outpatient Visits           2 months ago Acute cystitis without hematuria   [facility] [name], PA-C   2 months ago Anxiety, generalized   [facility] Jerrol Banana., MD   5 months ago [place] Jerrol Banana., MD   6 months ago Chronic combined systolic and diastolic congestive heart failure [facility])   [facility] Jerrol Banana., MD   9 months ago Urinary frequency   [place] [facility], Laurita Quint, FNP       Future Appointments             In 1 month Jerrol Banana., MD [facility], [facility]   In 2 months [name], Kathlene November, MD [facility], [facility]   In 7 months Ashley, Nicki Reaper, [place]

## 2021-02-23 NOTE — Telephone Encounter (Signed)
Please advise refill?  LOV: 12/21/2020 NOV: 04/13/2021 Last refill: 12/07/2020 #60 w/1 refill.

## 2021-02-24 MED ORDER — LORAZEPAM 1 MG PO TABS: 1.0000 mg | ORAL_TABLET | Freq: Two times a day (BID) | ORAL | 1 refills | Status: DC | PRN

## 2021-02-25 ENCOUNTER — Other Ambulatory Visit: Payer: Self-pay | Admitting: Family Medicine

## 2021-02-28 ENCOUNTER — Other Ambulatory Visit: Payer: Self-pay | Admitting: Family Medicine

## 2021-03-04 ENCOUNTER — Telehealth (INDEPENDENT_AMBULATORY_CARE_PROVIDER_SITE_OTHER): Payer: Medicare Other | Admitting: Family Medicine

## 2021-03-04 ENCOUNTER — Ambulatory Visit: Payer: Self-pay

## 2021-03-04 ENCOUNTER — Other Ambulatory Visit: Payer: Self-pay

## 2021-03-04 ENCOUNTER — Encounter: Payer: Self-pay | Admitting: Family Medicine

## 2021-03-04 DIAGNOSIS — J019 Acute sinusitis, unspecified: Secondary | ICD-10-CM

## 2021-03-04 MED ORDER — AMOXICILLIN 500 MG PO CAPS: 1000.0000 mg | ORAL_CAPSULE | Freq: Two times a day (BID) | ORAL | 0 refills | Status: AC

## 2021-03-04 NOTE — Telephone Encounter (Signed)
° °  Chief Complaint: Cough, congestion Symptoms: Green mucus with flecks of blood. Has not done a COVID 19 test. SOB with exertion. Frequency: Started 1 month ago Pertinent Negatives: Patient denies fever Disposition: [] ED /[] Urgent Care (no appt availability in office) / [x] Appointment(In office/virtual)/ []  [place] [facility]/ [] Home Care/ [] Refused Recommended Disposition /[] [place] [facility]/ []  Follow-up with PCP Additional Notes:   Reason for Disposition  [1] MILD difficulty breathing (e.g., minimal/no SOB at rest, SOB with walking, pulse <100) AND [2] still present when not coughing  Answer Assessment - Initial Assessment Questions 1. ONSET: "When did the cough begin?"      1 month ago 2. SEVERITY: "How bad is the cough today?"      Severe 3. SPUTUM: "Describe the color of your sputum" (none, dry cough; clear, white, yellow, green)     Green 4. HEMOPTYSIS: "Are you coughing up any blood?" If so ask: "How much?" (flecks, streaks, tablespoons, etc.)     Yes - flecks 5. DIFFICULTY BREATHING: "Are you having difficulty breathing?" If Yes, ask: "How bad is it?" (e.g., mild, moderate, severe)    - MILD: No SOB at rest, mild SOB with walking, speaks normally in sentences, can lie down, no retractions, pulse < 100.    - MODERATE: SOB at rest, SOB with minimal exertion and prefers to sit, cannot lie down flat, speaks in phrases, mild retractions, audible wheezing, pulse 100-120.    - SEVERE: Very SOB at rest, speaks in single words, struggling to breathe, sitting hunched forward, retractions, pulse > 120      Mild 6. FEVER: "Do you have a fever?" If Yes, ask: "What is your temperature, how was it measured, and when did it start?"     No 7. CARDIAC HISTORY: "Do you have any history of heart disease?" (e.g., heart attack, congestive heart failure)      Yes 8. LUNG HISTORY: "Do you have any history of lung disease?"  (e.g., pulmonary embolus, asthma, emphysema)     No 9. PE RISK  FACTORS: "Do you have a history of blood clots?" (or: recent major surgery, recent prolonged travel, bedridden)     No 10. OTHER SYMPTOMS: "Do you have any other symptoms?" (e.g., runny nose, wheezing, chest pain)       Runny nose 11. PREGNANCY: "Is there any chance you are pregnant?" "When was your last menstrual period?"       No 12. TRAVEL: "Have you traveled out of the country in the last month?" (e.g., travel history, exposures)       No  Protocols used: Cough - Acute Productive-A-AH

## 2021-03-04 NOTE — Progress Notes (Signed)
MyChart Video Visit    Virtual Visit via Video Note   This visit type was conducted due to national recommendations for restrictions regarding the COVID-19 Pandemic (e.g. social distancing) in an effort to limit this patient's exposure and mitigate transmission in our community. This patient is at least at moderate risk for complications without adequate follow up. This format is felt to be most appropriate for this patient at this time. Physical exam was limited by quality of the video and audio technology used for the visit.   Patient location: home Provider location: [facility]  I discussed the limitations of evaluation and management by telemedicine and the availability of in person appointments. The patient expressed understanding and agreed to proceed.  Video connection was lost when less than 50% of the duration of the visit was complete, at which time the remainder of the visit was completed via audio only.    Patient: Tracy Mann   DOB: 01/22/1934   88 y.o. Female  MRN: [id number] Visit Date: 03/04/2021  Today's healthcare provider: Lelon Huh, MD   Chief Complaint  Patient presents with   Cough   Subjective    Cough Episode onset: 1 month ago. The problem has been gradually worsening. The cough is Productive of sputum (with green mucus and streaks of blood). Associated symptoms include ear congestion, hemoptysis, nasal congestion, postnasal drip and shortness of breath (with exertion). Pertinent negatives include no chills, ear pain, fever, sore throat or wheezing. Treatments tried: Mucinex. The treatment provided mild relief.    Having green nasal drainage and blood in nasal drainage. Some pressure in ears, but not painful.    Medications: Outpatient Medications Prior to Visit  Medication Sig   ACCU-CHEK AVIVA PLUS test strip USE WITH METER TO CHECK GLUCOSE BEFORE MEALS AND AT BEDTIME   albuterol (VENTOLIN HFA) 108 (90 Base) MCG/ACT inhaler TAKE 2 PUFFS BY MOUTH  EVERY 6 HOURS AS NEEDED FOR WHEEZE OR SHORTNESS OF BREATH   estradiol (ESTRACE) 0.1 MG/GM vaginal cream Place 1 Applicatorful vaginally at bedtime.   ezetimibe (ZETIA) 10 MG tablet TAKE 1 TABLET BY MOUTH EVERY DAY   flavoxATE (URISPAS) 100 MG tablet Take 1 tablet (100 mg total) by mouth 2 (two) times daily as needed for bladder spasms.   fluticasone (FLONASE) 50 MCG/ACT nasal spray Place 2 sprays into both nostrils daily.   [START ON 03/19/2021] HYDROcodone-acetaminophen (NORCO) 10-325 MG tablet Take 1 tablet by mouth every 6 (six) hours as needed for severe pain. Must last 30 days.   [START ON 04/18/2021] HYDROcodone-acetaminophen (NORCO) 10-325 MG tablet Take 1 tablet by mouth every 6 (six) hours as needed for severe pain. Must last 30 days.   HYDROcodone-acetaminophen (NORCO) 10-325 MG tablet Take 1 tablet by mouth every 6 (six) hours as needed for severe pain. Must last 30 days.   insulin glargine (LANTUS SOLOSTAR) 100 UNIT/ML Solostar Pen Inject 26 Units into the skin at bedtime.   insulin lispro (HUMALOG) 100 UNIT/ML KwikPen Inject 10-25 Units into the skin in the morning, at noon, and at bedtime.   Insulin Pen Needle (RELION PEN NEEDLE 31G/8MM) 31G X 8 MM MISC Use with insulin pen three times daily   levothyroxine (SYNTHROID) 88 MCG tablet Take 88 mcg by mouth daily.   lisinopril (ZESTRIL) 10 MG tablet Take 1 tablet (10 mg total) by mouth daily.   LORazepam (ATIVAN) 1 MG tablet Take 1 tablet (1 mg total) by mouth 2 (two) times daily as needed.   Magnesium  500 MG CAPS Take 1,000 mg by mouth daily.    metolazone (ZAROXOLYN) 2.5 MG tablet Take 1 tablet (2.5 mg total) by mouth as directed. Take two times a week with torsemide 40 mg   metoprolol succinate (TOPROL-XL) 25 MG 24 hr tablet Take 1 tablet (25 mg total) by mouth daily.   nitroGLYCERIN (NITROSTAT) 0.4 MG SL tablet Place 1 tablet (0.4 mg total) under the tongue every 5 (five) minutes as needed for chest pain.   NONFORMULARY OR COMPOUNDED  ITEM See pharmacy note- Not to use on any open skin or wound.   potassium chloride SA (KLOR-CON) 20 MEQ tablet Take 1 tablet (20 mEq total) by mouth daily.   torsemide (DEMADEX) 20 MG tablet Take 2 tablets (40 mg total) by mouth daily.   warfarin (COUMADIN) 4 MG tablet Take daily or as directed by physician   [DISCONTINUED] nitrofurantoin, macrocrystal-monohydrate, (MACROBID) 100 MG capsule Take 1 capsule (100 mg total) by mouth daily.   No facility-administered medications prior to visit.    Review of Systems  Constitutional:  Positive for fatigue. Negative for chills, diaphoresis and fever.  HENT:  Positive for congestion (head and chest congestion) and postnasal drip. Negative for ear pain and sore throat.   Respiratory:  Positive for cough, hemoptysis and shortness of breath (with exertion). Negative for wheezing.      Objective    There were no vitals taken for this visit.   Physical Exam   Awake, alert, oriented x 3. In no apparent distress    Assessment & Plan     1. Subacute sinusitis, unspecified location  - amoxicillin (AMOXIL) 500 MG capsule; Take 2 capsules (1,000 mg total) by mouth 2 (two) times daily for 10 days.  Dispense: 40 capsule; Refill: 0      I discussed the assessment and treatment plan with the patient. The patient was provided an opportunity to ask questions and all were answered. The patient agreed with the plan and demonstrated an understanding of the instructions.   The patient was advised to call back or seek an in-person evaluation if the symptoms worsen or if the condition fails to improve as anticipated.  I provided 10 minutes of non-face-to-face time during this encounter.  The entirety of the information documented in the History of Present Illness, Review of Systems and Physical Exam were personally obtained by me. Portions of this information were initially documented by the CMA and reviewed by me for thoroughness and accuracy.    Lelon Huh, MD [facility] [phone] (phone) [phone] (fax)  [place]

## 2021-03-09 ENCOUNTER — Ambulatory Visit: Payer: Medicare Other

## 2021-03-11 ENCOUNTER — Ambulatory Visit: Payer: Medicare Other | Admitting: Cardiovascular Disease

## 2021-03-15 ENCOUNTER — Ambulatory Visit: Payer: Self-pay | Admitting: *Deleted

## 2021-03-15 NOTE — Telephone Encounter (Signed)
°  Chief Complaint: L leg pain- injury Symptoms: pain and swelling in lower leg-calve Frequency: 2 nights ago Pertinent Negatives: Patient denies   Disposition: [] ED /[] Urgent Care (no appt availability in office) / [] Appointment(In office/virtual)/ []  [place] [facility]/ [] Home Care/ [x] Refused Recommended Disposition /[] [place] [facility]/ []  Follow-up with PCP Additional Notes:   Patient wants to come to office- she is concerned about muscle tear vs DVT- patient advised office is closed for lunch will request work in appointment.

## 2021-03-15 NOTE — Telephone Encounter (Signed)
Reason for Disposition  [1] Thigh, calf, or ankle swelling AND [2] only 1 side  Answer Assessment - Initial Assessment Questions 1. ONSET: "When did the pain start?"      2 nights ago- had pain L leg when getting into bed 2. LOCATION: "Where is the pain located?"      L leg 3. PAIN: "How bad is the pain?"    (Scale 1-10; or mild, moderate, severe)   -  MILD (1-3): doesn't interfere with normal activities    -  MODERATE (4-7): interferes with normal activities (e.g., work or school) or awakens from sleep, limping    -  SEVERE (8-10): excruciating pain, unable to do any normal activities, unable to walk     Mild/moderate 4. WORK OR EXERCISE: "Has there been any recent work or exercise that involved this part of the body?"      no 5. CAUSE: "What do you think is causing the leg pain?"     Muscle spasm 6. OTHER SYMPTOMS: "Do you have any other symptoms?" (e.g., chest pain, back pain, breathing difficulty, swelling, rash, fever, numbness, weakness)     Swelling, tenderness 7. PREGNANCY: "Is there any chance you are pregnant?" "When was your last menstrual period?"     *No Answer*  Protocols used: Leg Pain-A-AH

## 2021-03-16 ENCOUNTER — Ambulatory Visit: Payer: Medicare Other | Admitting: Family Medicine

## 2021-03-16 ENCOUNTER — Ambulatory Visit: Payer: Medicare Other

## 2021-03-16 ENCOUNTER — Other Ambulatory Visit: Payer: Self-pay

## 2021-03-16 ENCOUNTER — Encounter: Payer: Self-pay | Admitting: Family Medicine

## 2021-03-16 VITALS — BP 107/68 | HR 66 | Temp 97.9°F | Resp 16 | Wt 219.0 lb

## 2021-03-16 DIAGNOSIS — I482 Chronic atrial fibrillation, unspecified: Secondary | ICD-10-CM | POA: Diagnosis not present

## 2021-03-16 DIAGNOSIS — Z6837 Body mass index (BMI) 37.0-37.9, adult: Secondary | ICD-10-CM

## 2021-03-16 DIAGNOSIS — F411 Generalized anxiety disorder: Secondary | ICD-10-CM | POA: Diagnosis not present

## 2021-03-16 DIAGNOSIS — Z794 Long term (current) use of insulin: Secondary | ICD-10-CM

## 2021-03-16 DIAGNOSIS — M79605 Pain in left leg: Secondary | ICD-10-CM

## 2021-03-16 DIAGNOSIS — E1159 Type 2 diabetes mellitus with other circulatory complications: Secondary | ICD-10-CM

## 2021-03-16 LAB — POCT INR
INR: 3.6 — AB (ref 2.0–3.0)
PT: 43.2

## 2021-03-16 NOTE — Patient Instructions (Signed)
USE HEATING PAD FOR LEG PAIN. FOR COUMADIN>>TAKE 3 MG Monday, Wednesday, Friday. TAKE 4 MG ALL OTHER DAYS. FOLLOW UP FOR PT/INR CHECK IN 2 WEEKS.

## 2021-03-16 NOTE — Progress Notes (Signed)
Established patient visit  I,Tracy Mann,acting as a scribe for Wilhemena Durie, MD.,have documented all relevant documentation on the behalf of Wilhemena Durie, MD,as directed by  Wilhemena Durie, MD while in the presence of Wilhemena Durie, MD.   Patient: Tracy Mann   DOB: 01-17-1934   87 y.o. Female  MRN: [id number] Visit Date: 03/16/2021  Today's healthcare provider: Wilhemena Durie, MD   Chief Complaint  Patient presents with   Leg Swelling   Subjective    HPI  Patient states she twisted her left leg the wrong way 4 nights ago. Patient states her left calf was very painful and felt has if there was a knot in the back of leg. Patient states her left is still very sore and slightly swollen and knot is still present. There is some redness at bottom of left leg.  Patient states 4 days ago she was getting into bed and had acute pain and had the above findings.  Getting a lot better.  She has had no diffuse calf swelling  Medications: Outpatient Medications Prior to Visit  Medication Sig   ACCU-CHEK AVIVA PLUS test strip USE WITH METER TO CHECK GLUCOSE BEFORE MEALS AND AT BEDTIME   albuterol (VENTOLIN HFA) 108 (90 Base) MCG/ACT inhaler TAKE 2 PUFFS BY MOUTH EVERY 6 HOURS AS NEEDED FOR WHEEZE OR SHORTNESS OF BREATH   estradiol (ESTRACE) 0.1 MG/GM vaginal cream Place 1 Applicatorful vaginally at bedtime.   ezetimibe (ZETIA) 10 MG tablet TAKE 1 TABLET BY MOUTH EVERY DAY   flavoxATE (URISPAS) 100 MG tablet Take 1 tablet (100 mg total) by mouth 2 (two) times daily as needed for bladder spasms.   fluticasone (FLONASE) 50 MCG/ACT nasal spray Place 2 sprays into both nostrils daily.   [START ON 03/19/2021] HYDROcodone-acetaminophen (NORCO) 10-325 MG tablet Take 1 tablet by mouth every 6 (six) hours as needed for severe pain. Must last 30 days.   [START ON 04/18/2021] HYDROcodone-acetaminophen (NORCO) 10-325 MG tablet Take 1 tablet by mouth every 6 (six) hours as needed  for severe pain. Must last 30 days.   HYDROcodone-acetaminophen (NORCO) 10-325 MG tablet Take 1 tablet by mouth every 6 (six) hours as needed for severe pain. Must last 30 days.   insulin glargine (LANTUS SOLOSTAR) 100 UNIT/ML Solostar Pen Inject 26 Units into the skin at bedtime.   insulin lispro (HUMALOG) 100 UNIT/ML KwikPen Inject 10-25 Units into the skin in the morning, at noon, and at bedtime.   Insulin Pen Needle (RELION PEN NEEDLE 31G/8MM) 31G X 8 MM MISC Use with insulin pen three times daily   levothyroxine (SYNTHROID) 88 MCG tablet Take 88 mcg by mouth daily.   lisinopril (ZESTRIL) 10 MG tablet Take 1 tablet (10 mg total) by mouth daily.   LORazepam (ATIVAN) 1 MG tablet Take 1 tablet (1 mg total) by mouth 2 (two) times daily as needed.   Magnesium 500 MG CAPS Take 1,000 mg by mouth daily.    metolazone (ZAROXOLYN) 2.5 MG tablet Take 1 tablet (2.5 mg total) by mouth as directed. Take two times a week with torsemide 40 mg   metoprolol succinate (TOPROL-XL) 25 MG 24 hr tablet Take 1 tablet (25 mg total) by mouth daily.   nitroGLYCERIN (NITROSTAT) 0.4 MG SL tablet Place 1 tablet (0.4 mg total) under the tongue every 5 (five) minutes as needed for chest pain.   NONFORMULARY OR COMPOUNDED ITEM See pharmacy note- Not to use on any open  skin or wound.   potassium chloride SA (KLOR-CON) 20 MEQ tablet Take 1 tablet (20 mEq total) by mouth daily.   torsemide (DEMADEX) 20 MG tablet Take 2 tablets (40 mg total) by mouth daily.   warfarin (COUMADIN) 4 MG tablet Take daily or as directed by physician   No facility-administered medications prior to visit.    Review of Systems  Constitutional:  Negative for appetite change, chills, fatigue and fever.  Respiratory:  Negative for chest tightness and shortness of breath.   Cardiovascular:  Positive for leg swelling. Negative for chest pain and palpitations.  Gastrointestinal:  Negative for abdominal pain, nausea and vomiting.  Neurological:  Negative  for dizziness and weakness.   Last hemoglobin A1c Lab Results  Component Value Date   HGBA1C 8.2 (H) 12/31/2019       Objective    BP 107/68 (BP Location: Right Arm, Patient Position: Sitting, Cuff Size: Large)    Pulse 66    Temp 97.9 F (36.6 C) (Temporal)    Resp 16    Wt 219 lb (99.3 kg)    SpO2 97%    BMI 35.89 kg/m  BP Readings from Last 3 Encounters:  03/16/21 107/68  02/17/21 (!) 142/80  02/14/21 (!) 152/77   Wt Readings from Last 3 Encounters:  03/16/21 219 lb (99.3 kg)  02/17/21 222 lb 2 oz (100.8 kg)  02/14/21 213 lb (96.6 kg)      Physical Exam Vitals reviewed.  Constitutional:      General: She is not in acute distress.    Appearance: Normal appearance. She is not ill-appearing.  HENT:     Head: Normocephalic.  Cardiovascular:     Rate and Rhythm: Normal rate and regular rhythm.     Pulses: Normal pulses.     Heart sounds: Normal heart sounds. No murmur heard.   No friction rub. No gallop.  Pulmonary:     Effort: Pulmonary effort is normal. No respiratory distress.     Breath sounds: Normal breath sounds. No stridor. No wheezing, rhonchi or rales.  Abdominal:     General: Bowel sounds are normal.     Palpations: Abdomen is soft.     Tenderness: There is no abdominal tenderness. There is no right CVA tenderness or left CVA tenderness.  Musculoskeletal:     Right lower leg: No edema.     Left lower leg: No edema.     Comments: 1+ lower extremity edema chronic Mild muscular tenderness left calf think is a muscular strain versus very small hematoma.  Certainly no recurrent DVT and no infection.  Skin:    General: Skin is warm and dry.     Comments: Very fair skin.  Neurological:     Mental Status: She is alert and oriented to person, place, and time.  Psychiatric:        Mood and Affect: Mood normal.        Behavior: Behavior normal.      Results for orders placed or performed in visit on 03/16/21  POCT INR  Result Value Ref Range   INR 3.6 (A)  2.0 - 3.0   PT 43.2     Assessment & Plan     1. Acute leg pain, left This is a muscular strain.  Could be a small hematoma involved.  Either way would recommend using heating pad on the leg.  2. Chronic atrial fibrillation (HCC) INR low but high today at 3.6.  His Coumadin dose is 3  mg Monday Wednesday Friday and 4 mg other days. - POCT INR  3. Anxiety, generalized Chronic issue.  4. Class 2 severe obesity due to excess calories with serious comorbidity and body mass index (BMI) of 37.0 to 37.9 in adult [facility]) Weight loss will help.  5. Type 2 diabetes mellitus with other circulatory complication, with long-term current use of insulin ([place]) Followed by endocrinology.  Goal A1c less than 8 in this patient was 87 and still lives alone.    Return in about 1 month (around 04/13/2021).      I, Wilhemena Durie, MD, have reviewed all documentation for this visit. The documentation on 03/20/21 for the exam, diagnosis, procedures, and orders are all accurate and complete.    Jamair Cato Cranford Mon, MD  [facility] [phone] (phone) [phone] (fax)  [place]

## 2021-03-30 ENCOUNTER — Ambulatory Visit: Payer: Medicare Other

## 2021-03-30 ENCOUNTER — Other Ambulatory Visit: Payer: Self-pay

## 2021-03-30 ENCOUNTER — Ambulatory Visit (INDEPENDENT_AMBULATORY_CARE_PROVIDER_SITE_OTHER): Payer: Medicare Other

## 2021-03-30 DIAGNOSIS — I482 Chronic atrial fibrillation, unspecified: Secondary | ICD-10-CM

## 2021-03-30 LAB — POCT INR
INR: 2.1 (ref 2.0–3.0)
PT: 25.3

## 2021-03-30 NOTE — Patient Instructions (Signed)
Description   ?Take 3 mg Wednesday, Friday and Sunday Take 4 mg all other days. Recheck in 3 weeks. ?  ? ? ?

## 2021-04-13 ENCOUNTER — Ambulatory Visit: Payer: Medicare Other | Admitting: Family Medicine

## 2021-04-19 ENCOUNTER — Ambulatory Visit: Payer: Medicare Other | Admitting: Physician Assistant

## 2021-04-19 ENCOUNTER — Other Ambulatory Visit: Payer: Self-pay | Admitting: Family Medicine

## 2021-04-19 ENCOUNTER — Encounter: Payer: Self-pay | Admitting: Physician Assistant

## 2021-04-19 ENCOUNTER — Other Ambulatory Visit: Payer: Self-pay

## 2021-04-19 VITALS — BP 108/78 | HR 52 | Temp 99.0°F | Resp 18 | Ht 65.0 in | Wt 210.0 lb

## 2021-04-19 DIAGNOSIS — R3989 Other symptoms and signs involving the genitourinary system: Secondary | ICD-10-CM | POA: Diagnosis not present

## 2021-04-19 DIAGNOSIS — N3020 Other chronic cystitis without hematuria: Secondary | ICD-10-CM

## 2021-04-19 DIAGNOSIS — N3000 Acute cystitis without hematuria: Secondary | ICD-10-CM

## 2021-04-19 MED ORDER — LEVOFLOXACIN 500 MG PO TABS: 500.0000 mg | ORAL_TABLET | Freq: Every day | ORAL | 0 refills | Status: AC

## 2021-04-19 NOTE — Progress Notes (Signed)
?I,[name],acting as a Education administrator for [employer], PA-C.,have documented all relevant documentation on the behalf of Tracy Kirschner, PA-C,as directed by  Tracy Kirschner, PA-C while in the presence of Tracy Kirschner, PA-C. ? ?Acute Office Visit ? ?Subjective:  ? ? Patient ID: Tracy Mann, female    DOB: [birth year], 88 y.o.   MRN: [id number] ? ?Tracy Mann is an 88 y/o female who presents today with dysuria, lower abdominal pain, weakness for the last week. Reports taking some left over ampicillin ?  She has at home as well as Azo. Denies fevers, dizziness, confusion. ?She does get frequent UTIs and used to follow with urology. Currently does not take anything preventatively.  ? ? ?Past Medical History:  ?Diagnosis Date  ? Acute myocardial infarction of inferior wall (HCC)   ? a. 10/2005 - Presumed to be 2/2 to a coronary embolus in setting of persistent Afib-->Cath 2014 relatively nl cors, EF 45-50% w/ severe distal inferior/apical inferior HK w/ aneursymal appearance.  ? Arthritis   ? Chronic anticoagulation   ? a. warfarin.  ? Chronic atrial fibrillation (HCC)   ? a. CHA2DS2VASc = 6--> warfarin.  ? Chronic combined systolic and diastolic CHF (congestive heart failure) ([place])   ? a. 10/2012 Echo: EF 35-40%, diff HK, mild MR, mild biatrial enlargement;  b. 11/2012 EF 45-50% by LV gram; c. echo 07/2014: EF 40-45%, mod conc LVH, mod MR, severe biatrial enlargement, PASP 45 mm Hg c. Echo 01/2018 EF 45-50%, duffuse hypokinesis, mild MR, LA mod dilated, norm RVSF, dilated IVC  ? Essential hypertension   ? Hyperlipidemia, mixed   ? Mixed Ischemic/Nonischemic Cardiomyopathy   ? a. 10/2012 Echo: EF 35-40%;  b. 11/2012 EF 45-50% by LV gram.  ? Nonobstructive coronary artery disease   ? a. 2007 inferior wall MI thought to be secondary to thrombus from atrial fib b. 2014 cath with R dominant coronary arterial system with mild luminal irregularities c. Myoview 05/2015 old inferior MI, no new ischemic changes  ? Obesity   ?  Thyroid disease   ? hypothyroidism  ? Type II diabetes mellitus ([place])   ? a. 07/2018 A1c 8.7 - long term insulin use  ? Venous insufficiency   ? Vitamin D deficiency   ? ? ?Past Surgical History:  ?Procedure Laterality Date  ? CARDIAC CATHETERIZATION  11/2012  ? [facility];EF 45-50%  ? CHOLECYSTECTOMY  1999  ? TUBAL LIGATION  1968  ? VESICOVAGINAL FISTULA CLOSURE W/ TAH  1996  ? ? ?Family History  ?Problem Relation Age of Onset  ? Heart disease Mother   ? Thyroid disease Father   ? ? ?Social History  ? ?Socioeconomic History  ? Marital status: Widowed  ?  Spouse name: widowed  ? Number of children: 4  ? Years of education: 33  ? Highest education level: 12th grade  ?Occupational History  ? Occupation: retired  ?Tobacco Use  ? Smoking status: Never  ? Smokeless tobacco: Never  ?Vaping Use  ? Vaping Use: Never used  ?Substance and Sexual Activity  ? Alcohol use: No  ? Drug use: No  ? Sexual activity: Never  ?Other Topics Concern  ? Not on file  ?Social History Narrative  ? Widowed  ? Has children and grandchildren  ? Lives in [place]  ? ?Social Determinants of Health  ? ?Financial Resource Strain: Not on file  ?Food Insecurity: Not on file  ?Transportation Needs: Not on file  ?Physical Activity: Not on file  ?Stress: Not on file  ?  Social Connections: Not on file  ?Intimate Partner Violence: Not on file  ? ? ?Outpatient Medications Prior to Visit  ?Medication Sig Dispense Refill  ? ACCU-CHEK AVIVA PLUS test strip USE WITH METER TO CHECK GLUCOSE BEFORE MEALS AND AT BEDTIME 200 strip 12  ? albuterol (VENTOLIN HFA) 108 (90 Base) MCG/ACT inhaler TAKE 2 PUFFS BY MOUTH EVERY 6 HOURS AS NEEDED FOR WHEEZE OR SHORTNESS OF BREATH 8.5 each 2  ? estradiol (ESTRACE) 0.1 MG/GM vaginal cream Place 1 Applicatorful vaginally at bedtime. 42.5 g 0  ? ezetimibe (ZETIA) 10 MG tablet TAKE 1 TABLET BY MOUTH EVERY DAY 90 tablet 2  ? flavoxATE (URISPAS) 100 MG tablet Take 1 tablet (100 mg total) by mouth 2 (two) times daily as needed for bladder  spasms. 60 tablet 11  ? fluticasone (FLONASE) 50 MCG/ACT nasal spray Place 2 sprays into both nostrils daily. 48 mL 3  ? HYDROcodone-acetaminophen (NORCO) 10-325 MG tablet Take 1 tablet by mouth every 6 (six) hours as needed for severe pain. Must last 30 days. 105 tablet 0  ? HYDROcodone-acetaminophen (NORCO) 10-325 MG tablet Take 1 tablet by mouth every 6 (six) hours as needed for severe pain. Must last 30 days. 105 tablet 0  ? HYDROcodone-acetaminophen (NORCO) 10-325 MG tablet Take 1 tablet by mouth every 6 (six) hours as needed for severe pain. Must last 30 days. 105 tablet 0  ? insulin glargine (LANTUS SOLOSTAR) 100 UNIT/ML Solostar Pen Inject 26 Units into the skin at bedtime.    ? insulin lispro (HUMALOG) 100 UNIT/ML KwikPen Inject 10-25 Units into the skin in the morning, at noon, and at bedtime.    ? Insulin Pen Needle (RELION PEN NEEDLE 31G/8MM) 31G X 8 MM MISC Use with insulin pen three times daily 100 each 12  ? levothyroxine (SYNTHROID) 88 MCG tablet Take 88 mcg by mouth daily.    ? lisinopril (ZESTRIL) 10 MG tablet Take 1 tablet (10 mg total) by mouth daily. 90 tablet 3  ? LORazepam (ATIVAN) 1 MG tablet Take 1 tablet (1 mg total) by mouth 2 (two) times daily as needed. 60 tablet 1  ? Magnesium 500 MG CAPS Take 1,000 mg by mouth daily.     ? metolazone (ZAROXOLYN) 2.5 MG tablet Take 1 tablet (2.5 mg total) by mouth as directed. Take two times a week with torsemide 40 mg 90 tablet 1  ? metoprolol succinate (TOPROL-XL) 25 MG 24 hr tablet Take 1 tablet (25 mg total) by mouth daily. 90 tablet 3  ? nitroGLYCERIN (NITROSTAT) 0.4 MG SL tablet Place 1 tablet (0.4 mg total) under the tongue every 5 (five) minutes as needed for chest pain. 25 tablet 1  ? NONFORMULARY OR COMPOUNDED ITEM See pharmacy note- Not to use on any open skin or wound. 120 each 3  ? potassium chloride SA (KLOR-CON) 20 MEQ tablet Take 1 tablet (20 mEq total) by mouth daily. 90 tablet 3  ? torsemide (DEMADEX) 20 MG tablet Take 2 tablets (40 mg  total) by mouth daily. 200 tablet 4  ? warfarin (COUMADIN) 4 MG tablet Take daily or as directed by physician 90 tablet 4  ? ?No facility-administered medications prior to visit.  ? ? ?Allergies  ?Allergen Reactions  ? Duloxetine Other (See Comments)  ?  Same as Paroxetine  ? Other Anaphylaxis and Swelling  ?  'tongue swelling'  ? Paroxetine Other (See Comments)  ?  "Horrible headache," Stomach pain, Diarrhea  ? Sulfa Antibiotics Anaphylaxis  ? Cephalexin   ?  Blisters  ? Clarithromycin Other (See Comments)  ?  Hives, headaches, hard time swelling, felt like throat was closing up ?Hives, headaches, hard time swelling, felt like throat was closing up  ? Diphenhydramine Other (See Comments)  ? Estrogens Conjugated   ? Estrogens Conjugated Other (See Comments)  ? Sulfonamide Derivatives Swelling  ?  'tongue swelling'  ? Nitrofurantoin Nausea Only  ? ? ?Review of Systems  ?Constitutional:  Negative for fatigue and fever.  ?Respiratory:  Negative for cough and shortness of breath.   ?Cardiovascular:  Negative for chest pain and leg swelling.  ?Gastrointestinal:  Positive for abdominal pain.  ?Genitourinary:  Positive for dysuria and urgency. Negative for hematuria.  ?Neurological:  Negative for dizziness and headaches.  ? ?   ?Objective:  ?  ?Physical Exam ?Constitutional:   ?   General: She is awake.  ?   Appearance: She is well-developed.  ?HENT:  ?   Head: Normocephalic.  ?Eyes:  ?   Conjunctiva/sclera: Conjunctivae normal.  ?Cardiovascular:  ?   Rate and Rhythm: Normal rate and regular rhythm.  ?   Heart sounds: Normal heart sounds.  ?Pulmonary:  ?   Effort: Pulmonary effort is normal.  ?   Breath sounds: Normal breath sounds.  ?Abdominal:  ?   Tenderness: There is no abdominal tenderness. There is no right CVA tenderness, left CVA tenderness, guarding or rebound.  ?   Hernia: No hernia is present.  ?Skin: ?   General: Skin is warm.  ?Neurological:  ?   Mental Status: She is alert and oriented to person, place, and  time.  ?Psychiatric:     ?   Attention and Perception: Attention normal.     ?   Mood and Affect: Mood normal.     ?   Speech: Speech normal.     ?   Behavior: Behavior is cooperative.  ? ? ?There were no

## 2021-04-20 ENCOUNTER — Ambulatory Visit: Payer: Medicare Other

## 2021-04-21 LAB — URINE CULTURE: Organism ID, Bacteria: NO GROWTH

## 2021-04-21 NOTE — Telephone Encounter (Signed)
Requested medication (s) are due for refill today:Due 04/24/21 ? ?Requested medication (s) are on the active medication list: yes   ? ?Last refill: 02/24/21  #60  1 refills ? ?Future visit scheduled yes 06/14/21 ? ?Notes to clinic:  Not delegated ? ?Requested Prescriptions  ?Pending Prescriptions Disp Refills  ? LORazepam (ATIVAN) 1 MG tablet [Pharmacy Med Name: LORAZEPAM '1MG'$  TABLETS] 60 tablet   ?  Sig: TAKE 1 TABLET(1 MG) BY MOUTH TWICE DAILY AS NEEDED  ?  ? Not Delegated - Psychiatry: Anxiolytics/Hypnotics 2 Failed - 04/19/2021  1:05 PM  ?  ?  Failed - This refill cannot be delegated  ?  ?  Failed - Urine Drug Screen completed in last 360 days  ?  ?  Passed - Patient is not pregnant  ?  ?  Passed - Valid encounter within last 6 months  ?  Recent Outpatient Visits   ? ?      ? 2 days ago Acute cystitis without hematuria  ? [facility] Thedore Mins, [place], PA-C  ? 1 month ago Acute leg pain, left  ? [facility] Jerrol Banana., MD  ? 1 month ago Subacute sinusitis, unspecified location  ? [facility] Birdie Sons, MD  ? 4 months ago Acute cystitis without hematuria  ? [place], PA-C  ? 4 months ago Anxiety, generalized  ? [facility] Jerrol Banana., MD  ? ?  ?  ?Future Appointments   ? ?        ? In 3 weeks [name], Kathlene November, MD [facility], [facility]  ? In 1 month Jerrol Banana., MD [facility], [facility]  ? In 5 months MacDiarmid, Nicki Reaper, MD [place]  ? ?  ? ?  ?  ?  ? ? ? ? ?

## 2021-04-25 ENCOUNTER — Ambulatory Visit: Payer: Medicare Other | Admitting: Urology

## 2021-04-28 ENCOUNTER — Other Ambulatory Visit: Payer: Self-pay | Admitting: Family Medicine

## 2021-04-29 NOTE — Telephone Encounter (Signed)
Requested medications are due for refill today.  yes ? ?Requested medications are on the active medications list.  yes ? ?Last refill. 07/06/2020 #90 2 refills ? ?Future visit scheduled.   yes ? ?Notes to clinic.  Refill failed protocol due to expired labs. ? ? ? ?Requested Prescriptions  ?Pending Prescriptions Disp Refills  ? ezetimibe (ZETIA) 10 MG tablet [Pharmacy Med Name: EZETIMIBE '10MG'$  TABLETS] 90 tablet 2  ?  Sig: TAKE 1 TABLET BY MOUTH EVERY DAY  ?  ? Cardiovascular:  Antilipid - Sterol Transport Inhibitors Failed - 04/28/2021 11:50 AM  ?  ?  Failed - Lipid Panel in normal range within the last 12 months  ?  Cholesterol, Total  ?Date Value Ref Range Status  ?01/17/2019 172 100 - 199 mg/dL Final  ? ?LDL Chol Calc (NIH)  ?Date Value Ref Range Status  ?01/17/2019 94 0 - 99 mg/dL Final  ? ?HDL  ?Date Value Ref Range Status  ?01/17/2019 57 >39 mg/dL Final  ? ?Triglycerides  ?Date Value Ref Range Status  ?01/17/2019 119 0 - 149 mg/dL Final  ? ?  ?  ?  Passed - AST in normal range and within 360 days  ?  AST  ?Date Value Ref Range Status  ?12/21/2020 23 0 - 40 IU/L Final  ? ?SGOT(AST)  ?Date Value Ref Range Status  ?05/06/2013 25 15 - 37 Unit/L Final  ?  ?  ?  ?  Passed - ALT in normal range and within 360 days  ?  ALT  ?Date Value Ref Range Status  ?12/21/2020 10 0 - 32 IU/L Final  ? ?SGPT (ALT)  ?Date Value Ref Range Status  ?05/06/2013 20 12 - 78 U/L Final  ?  ?  ?  ?  Passed - Patient is not pregnant  ?  ?  Passed - Valid encounter within last 12 months  ?  Recent Outpatient Visits   ? ?      ? 1 week ago Acute cystitis without hematuria  ? [facility] Thedore Mins, [place], PA-C  ? 1 month ago Acute leg pain, left  ? [facility] Jerrol Banana., MD  ? 1 month ago Subacute sinusitis, unspecified location  ? [facility] Birdie Sons, MD  ? 4 months ago Acute cystitis without hematuria  ? [place], PA-C  ? 4 months ago Anxiety,  generalized  ? [facility] Jerrol Banana., MD  ? ?  ?  ?Future Appointments   ? ?        ? In 1 week McGowan, Gordan Payment [place]  ? In 2 weeks [name], Kathlene November, MD [facility], [facility]  ? In 1 month Jerrol Banana., MD [facility], [facility]  ? In 5 months MacDiarmid, Nicki Reaper, MD [place]  ? ?  ? ?  ?  ?  ?  ?

## 2021-05-04 ENCOUNTER — Ambulatory Visit (INDEPENDENT_AMBULATORY_CARE_PROVIDER_SITE_OTHER): Payer: Medicare Other

## 2021-05-04 DIAGNOSIS — I482 Chronic atrial fibrillation, unspecified: Secondary | ICD-10-CM

## 2021-05-04 LAB — POCT INR
INR: 4 — AB (ref 2.0–3.0)
PT: 47.4

## 2021-05-04 NOTE — Patient Instructions (Signed)
Description   ?Hold for 2 days then take '4mg'$  Sun, Wed. And Friday, and 3 mg all other days. Recheck in 2 weeks. ?  ? ? ?

## 2021-05-09 ENCOUNTER — Other Ambulatory Visit: Payer: Self-pay

## 2021-05-09 ENCOUNTER — Encounter: Payer: Self-pay | Admitting: Pain Medicine

## 2021-05-09 ENCOUNTER — Ambulatory Visit: Payer: Medicare Other | Attending: Pain Medicine | Admitting: Pain Medicine

## 2021-05-09 VITALS — BP 118/55 | HR 84 | Temp 97.4°F | Resp 16 | Ht 65.5 in | Wt 204.0 lb

## 2021-05-09 DIAGNOSIS — M5441 Lumbago with sciatica, right side: Secondary | ICD-10-CM | POA: Insufficient documentation

## 2021-05-09 DIAGNOSIS — M5442 Lumbago with sciatica, left side: Secondary | ICD-10-CM | POA: Insufficient documentation

## 2021-05-09 DIAGNOSIS — M25561 Pain in right knee: Secondary | ICD-10-CM | POA: Diagnosis present

## 2021-05-09 DIAGNOSIS — Z79899 Other long term (current) drug therapy: Secondary | ICD-10-CM | POA: Diagnosis present

## 2021-05-09 DIAGNOSIS — M79604 Pain in right leg: Secondary | ICD-10-CM | POA: Insufficient documentation

## 2021-05-09 DIAGNOSIS — G8929 Other chronic pain: Secondary | ICD-10-CM | POA: Diagnosis present

## 2021-05-09 DIAGNOSIS — M549 Dorsalgia, unspecified: Secondary | ICD-10-CM | POA: Diagnosis present

## 2021-05-09 DIAGNOSIS — M25552 Pain in left hip: Secondary | ICD-10-CM | POA: Diagnosis present

## 2021-05-09 DIAGNOSIS — M25562 Pain in left knee: Secondary | ICD-10-CM | POA: Insufficient documentation

## 2021-05-09 DIAGNOSIS — Z79891 Long term (current) use of opiate analgesic: Secondary | ICD-10-CM | POA: Diagnosis present

## 2021-05-09 DIAGNOSIS — M47817 Spondylosis without myelopathy or radiculopathy, lumbosacral region: Secondary | ICD-10-CM | POA: Insufficient documentation

## 2021-05-09 DIAGNOSIS — M25551 Pain in right hip: Secondary | ICD-10-CM | POA: Diagnosis present

## 2021-05-09 DIAGNOSIS — M79605 Pain in left leg: Secondary | ICD-10-CM | POA: Insufficient documentation

## 2021-05-09 DIAGNOSIS — G894 Chronic pain syndrome: Secondary | ICD-10-CM | POA: Insufficient documentation

## 2021-05-09 MED ORDER — HYDROCODONE-ACETAMINOPHEN 10-325 MG PO TABS: 1.0000 | ORAL_TABLET | Freq: Three times a day (TID) | ORAL | 0 refills | Status: DC | PRN

## 2021-05-09 NOTE — Patient Instructions (Signed)
____________________________________________________________________________________________ ? ?Medication Rules ? ?Purpose: To inform patients, and their family members, of our rules and regulations. ? ?Applies to: All patients receiving prescriptions (written or electronic). ? ?Pharmacy of record: Pharmacy where electronic prescriptions will be sent. If written prescriptions are taken to a different pharmacy, please inform the nursing staff. The pharmacy listed in the electronic medical record should be the one where you would like electronic prescriptions to be sent. ? ?Electronic prescriptions: In compliance with the [place] Strengthen Opioid Misuse Prevention (STOP) Act of 2017 (Session Law 2017-74/H243), effective January 30, 2018, all controlled substances must be electronically prescribed. Calling prescriptions to the pharmacy will cease to exist. ? ?Prescription refills: Only during scheduled appointments. Applies to all prescriptions. ? ?NOTE: The following applies primarily to controlled substances (Opioid* Pain Medications).  ? ?Type of encounter (visit): For patients receiving controlled substances, face-to-face visits are required. (Not an option or up to the patient.) ? ?Patient's responsibilities: ?Pain Pills: Bring all pain pills to every appointment (except for procedure appointments). ?Pill Bottles: Bring pills in original pharmacy bottle. Always bring the newest bottle. Bring bottle, even if empty. ?Medication refills: You are responsible for knowing and keeping track of what medications you take and those you need refilled. ?The day before your appointment: write a list of all prescriptions that need to be refilled. ?The day of the appointment: give the list to the admitting nurse. Prescriptions will be written only during appointments. No prescriptions will be written on procedure days. ?If you forget a medication: it will not be "Called in", "Faxed", or "electronically sent". You will  need to get another appointment to get these prescribed. ?No early refills. Do not call asking to have your prescription filled early. ?Prescription Accuracy: You are responsible for carefully inspecting your prescriptions before leaving our office. Have the discharge nurse carefully go over each prescription with you, before taking them home. Make sure that your name is accurately spelled, that your address is correct. Check the name and dose of your medication to make sure it is accurate. Check the number of pills, and the written instructions to make sure they are clear and accurate. Make sure that you are given enough medication to last until your next medication refill appointment. ?Taking Medication: Take medication as prescribed. When it comes to controlled substances, taking less pills or less frequently than prescribed is permitted and encouraged. ?Never take more pills than instructed. ?Never take medication more frequently than prescribed.  ?Inform other Doctors: Always inform, all of your healthcare providers, of all the medications you take. ?Pain Medication from other Providers: You are not allowed to accept any additional pain medication from any other Doctor or Healthcare provider. There are two exceptions to this rule. (see below) In the event that you require additional pain medication, you are responsible for notifying us, as stated below. ?Cough Medicine: Often these contain an opioid, such as codeine or hydrocodone. Never accept or take cough medicine containing these opioids if you are already taking an opioid* medication. The combination may cause respiratory failure and death. ?Medication Agreement: You are responsible for carefully reading and following our Medication Agreement. This must be signed before receiving any prescriptions from our practice. Safely store a copy of your signed Agreement. Violations to the Agreement will result in no further prescriptions. (Additional copies of our  Medication Agreement are available upon request.) ?Laws, Rules, & Regulations: All patients are expected to follow all Federal and State Laws, Statutes, Rules, & Regulations. Ignorance of   the Laws does not constitute a valid excuse.  ?Illegal drugs and Controlled Substances: The use of illegal substances (including, but not limited to marijuana and its derivatives) and/or the illegal use of any controlled substances is strictly prohibited. Violation of this rule may result in the immediate and permanent discontinuation of any and all prescriptions being written by our practice. The use of any illegal substances is prohibited. ?Adopted CDC guidelines & recommendations: Target dosing levels will be at or below 60 MME/day. Use of benzodiazepines** is not recommended. ? ?Exceptions: There are only two exceptions to the rule of not receiving pain medications from other Healthcare Providers. ?Exception #1 (Emergencies): In the event of an emergency (i.e.: accident requiring emergency care), you are allowed to receive additional pain medication. However, you are responsible for: As soon as you are able, call our office [phone], at any time of the day or night, and leave a message stating your name, the date and nature of the emergency, and the name and dose of the medication prescribed. In the event that your call is answered by a member of our staff, make sure to document and save the date, time, and the name of the person that took your information.  ?Exception #2 (Planned Surgery): In the event that you are scheduled by another doctor or dentist to have any type of surgery or procedure, you are allowed (for a period no longer than 30 days), to receive additional pain medication, for the acute post-op pain. However, in this case, you are responsible for picking up a copy of our "Post-op Pain Management for Surgeons" handout, and giving it to your surgeon or dentist. This document is available at our office, and  does not require an appointment to obtain it. Simply go to our office during business hours (Monday-Thursday from 8:00 AM to 4:00 PM) (Friday 8:00 AM to 12:00 Noon) or if you have a scheduled appointment with us, prior to your surgery, and ask for it by name. In addition, you are responsible for: calling our office [phone], at any time of the day or night, and leaving a message stating your name, name of your surgeon, type of surgery, and date of procedure or surgery. Failure to comply with your responsibilities may result in termination of therapy involving the controlled substances. ?Medication Agreement Violation. Following the above rules, including your responsibilities will help you in avoiding a Medication Agreement Violation (?Breaking your Pain Medication Contract?). ? ?*Opioid medications include: morphine, codeine, oxycodone, oxymorphone, hydrocodone, hydromorphone, meperidine, tramadol, tapentadol, buprenorphine, fentanyl, methadone. ?**Benzodiazepine medications include: diazepam (Valium), alprazolam (Xanax), clonazepam (Klonopine), lorazepam (Ativan), clorazepate (Tranxene), chlordiazepoxide (Librium), estazolam (Prosom), oxazepam (Serax), temazepam (Restoril), triazolam (Halcion) ?(Last updated: 10/27/2020) ?____________________________________________________________________________________________ ? ____________________________________________________________________________________________ ? ?Medication Recommendations and Reminders ? ?Applies to: All patients receiving prescriptions (written and/or electronic). ? ?Medication Rules & Regulations: These rules and regulations exist for your safety and that of others. They are not flexible and neither are we. Dismissing or ignoring them will be considered "non-compliance" with medication therapy, resulting in complete and irreversible termination of such therapy. (See document titled "Medication Rules" for more details.) In all conscience,  because of safety reasons, we cannot continue providing a therapy where the patient does not follow instructions. ? ?Pharmacy of record:  ?Definition: This is the pharmacy where your electronic prescriptions w

## 2021-05-09 NOTE — Progress Notes (Signed)
Nursing Pain Medication Assessment:  ?Safety precautions to be maintained throughout the outpatient stay will include: orient to surroundings, keep bed in low position, maintain call bell within reach at all times, provide assistance with transfer out of bed and ambulation.  ?Medication Inspection Compliance: Pill count conducted under aseptic conditions, in front of the patient. Neither the pills nor the bottle was removed from the patient's sight at any time. Once count was completed pills were immediately returned to the patient in their original bottle. ? ?Medication #1: Hydrocodone/APAP ?Pill/Patch Count:  105 of 105 pills remain ?Pill/Patch Appearance: Markings consistent with prescribed medication ?Bottle Appearance: Standard pharmacy container. Clearly labeled. ?Filled Date: 03 / 20 / 2023 ?Last Medication intake:  Today ? ?Medication #2: Hydrocodone/APAP ?Pill/Patch Count:  76 of 105 pills remain ?Pill/Patch Appearance: Markings consistent with prescribed medication ?Bottle Appearance: Standard pharmacy container. Clearly labeled. ?Filled Date: 02 / 21 / 2023 ?Last Medication intake:  Today ?

## 2021-05-09 NOTE — Progress Notes (Signed)
PROVIDER NOTE: Information contained herein reflects review and annotations entered in association with encounter. Interpretation of such information and data should be left to medically-trained personnel. Information provided to patient can be located elsewhere in the medical record under "Patient Instructions". Document created using STT-dictation technology, any transcriptional errors that may result from process are unintentional.  ?  ?Patient: Tracy Mann  Service Category: E/M  Provider: Gaspar Cola, MD  ?DOB: [birth year]  DOS: 05/09/2021  Specialty: Interventional Pain Management  ?MRN: [id number]  Setting: Ambulatory outpatient  PCP: Jerrol Banana., MD  ?Type: Established Patient    Referring Provider: Jerrol Banana.,*  ?Location: Office  Delivery: Face-to-face    ? ?HPI  ?[name], a 88 y.o. year old female, is here today because of her Chronic pain syndrome [G89.4]. Tracy Mann primary complain today is Leg Pain (Bilateral ) ?Last encounter: My last encounter with her was on 02/17/2021. ?Pertinent problems: Tracy Mann has Cervical muscle strain; DDD (degenerative disc disease), lumbosacral; Chronic venous insufficiency; Chronic pain syndrome; Abnormal MRI, lumbar spine (08/22/2019); Lumbar facet hypertrophy (Multilevel) (Bilateral); Lumbar central spinal stenosis (SEVERE) (L3-4) with neurogenic claudication; Grade 1 Anterolisthesis of lumbar spine (L4/L5); Lumbar facet arthropathy (L3-4 right-sided facet edema/joint effusion); Lumbar lateral recess stenosis (L4-5) (Right); Chronic low back pain (3ry area of Pain) (Bilateral) w/ sciatica (Bilateral); Lumbosacral radiculopathy at L5 (Bilateral); Chronic lower extremity pain (1ry area of Pain) (Bilateral); Lumbosacral facet syndrome; Chronic hip pain (2ry area of Pain) (Bilateral) (R>L); Osteoarthritis involving multiple joints; Osteoarthritis of knee (Right); Osteoarthritis of facet joint of lumbar spine; Osteoarthritis of lumbar  spine; Bilateral lower extremity edema; Cellulitis of right lower leg; Chronic knee pain (Bilateral); Peripheral vascular disease (Tracy Mann) (lower extremity) (Bilateral); Cervicalgia; Chronic upper back pain; and Lumbosacral spondylosis with radiculopathy on their pertinent problem list. ?Pain Assessment: Severity of Chronic pain is reported as a 7 /10. Location: Leg Left, Right/denies. Onset: More than a month ago. Quality: Discomfort, Constant, Other (Comment) (weakness and deep pain.). Timing: Constant. Modifying factor(s): compounding cream and pain medication.Marland Kitchen ?Vitals:  height is 5' 5.5" (1.664 m) and weight is 204 lb (92.5 kg). Her temporal temperature is 97.4 ?F (36.3 ?C) (abnormal). Her blood pressure is 118/55 (abnormal) and her pulse is 84. Her respiration is 16 and oxygen saturation is 96%.  ? ?Reason for encounter: medication management.   The patient indicates doing well with the current medication regimen. No adverse reactions or side effects reported to the medications.  ? ?According to the patient's pill count she currently has 181 pills left.  On 02/14/2021 I provided the patient with 3 prescriptions which should have lasted her until 05/09/2021.  Each prescription was for 105 pills for a total of 315 pills to last for a period of 90 days.  Today she returns to the clinic (80 days after her first fill) and she has 181 pills meaning that she has taken 134 pills over the past 80 days.  This represents an average of 1.675 pills/day or an average of ?51 pills/month.  At this rate, the patient should have that today 181 pills that she has a left should last her 108 days (August 24, 2021).  ? ?Today I have asked the patient how many pills she has been taking and she indicated taking 4/day.  Clearly this is not true since she would not have as many pills as she currently has left.  I asked her if she had any prior pain medication that  she was using, but she claims that the only medication for pain that she has is  the one that we provided her.  Today she also gave me this long story about how her pain has been such that she almost had to give [place] a call because she could not tolerate it.  However, when we look at the amount of medicine that she has left, she clearly has not been using the 4 pills.  They in fact she has been using less than 2/day.  She goes on to tell me that she needs more, but I have tried to explain to the patient that I do not see how she needs more if she is not even taking the ones that we have been prescribing.  She then said that she thought that we wanted her to take less.  I have clarified for her that if I wanted her to take less then I would have given her less.  I have also explained to her that we are providing her with enough medication to keep her comfortable, but she is really not using it.  The problem here is clearly not on our side but on the patient's self struggle with whether or not to take the medication.  Because she has not been using that much, today we will be cutting down the amount that we are providing her since I have no intention of allowing her to hoard medication.  Based on my calculations, since the patient still has enough medicine to last for another 108 days, I will set up her next refill to take place around that time.  In addition, I will cut down the amount that she is receiving from 105 pills/month to 60.  On her next refill, we will again count pills and see how much she is really using.  Once we get this issue with the medications straighten out, then we will resume the interventional therapies. ? ?UDS ordered today.  ? ?RTCB: 08/16/2021 ? ?Pharmacotherapy Assessment  ?Analgesic: Hydrocodone/APAP 10/325, (see the 05/09/2021 note) using 1.675 pills/day. ?MME/day: 10-20 mg/day  ? ?Monitoring: ?[place] PMP: PDMP reviewed during this encounter.       ?Pharmacotherapy: No side-effects or adverse reactions reported. ?Compliance: No problems identified. ?Effectiveness: Clinically  acceptable. ? ?Janett Billow, RN  05/09/2021 12:48 PM  Sign when Signing Visit ?Nursing Pain Medication Assessment:  ?Safety precautions to be maintained throughout the outpatient stay will include: orient to surroundings, keep bed in low position, maintain call bell within reach at all times, provide assistance with transfer out of bed and ambulation.  ?Medication Inspection Compliance: Pill count conducted under aseptic conditions, in front of the patient. Neither the pills nor the bottle was removed from the patient's sight at any time. Once count was completed pills were immediately returned to the patient in their original bottle. ? ?Medication #1: Hydrocodone/APAP ?Pill/Patch Count:  105 of 105 pills remain ?Pill/Patch Appearance: Markings consistent with prescribed medication ?Bottle Appearance: Standard pharmacy container. Clearly labeled. ?Filled Date: 03 / 20 / 2023 ?Last Medication intake:  Today ? ?Medication #2: Hydrocodone/APAP ?Pill/Patch Count:  76 of 105 pills remain ?Pill/Patch Appearance: Markings consistent with prescribed medication ?Bottle Appearance: Standard pharmacy container. Clearly labeled. ?Filled Date: 02 / 21 / 2023 ?Last Medication intake:  Today ?   UDS:  ?Summary  ?Date Value Ref Range Status  ?03/10/2020 Note  Final  ?  Comment:  ?  ==================================================================== ?[facility] (MW) ?==================================================================== ?Test  Result       Flag       Units ? ?Drug Present ?  Lorazepam                      >1205                   ng/mg creat ?   Source of lorazepam is a scheduled prescription medication. ? ?  Hydrocodone                    1742                    ng/mg creat ?  Hydromorphone                  209                     ng/mg creat ?  Dihydrocodeine                 576                     ng/mg creat ?  Norhydrocodone                 >3012                   ng/mg  creat ?   Sources of hydrocodone include scheduled prescription medications. ?   Hydromorphone, dihydrocodeine and norhydrocodone are expected ?   metabolites of hydrocodone. Hydromorphone and dihy

## 2021-05-11 ENCOUNTER — Telehealth: Payer: Self-pay | Admitting: Pain Medicine

## 2021-05-11 NOTE — Telephone Encounter (Signed)
Patient has called and is very frantic about how [facility] has prescribed her medication.  She was in this week and had a surplus of medications and [facility] scheduled her next fill for 07/30/21.  She is very concerned that she is going to run out of medication.  I have explained to her that because she had a surplus of medicine, this is why [facility] has elected to push her next fill out.  I have told her that a surplus of medication is just as derogatory as being short medication.  I have instructed patient to continue taking her medication as directed.  When she gets 1/2 way through the 2nd bottle that was filled in March to call and let us know and we will accommodate her with an appt if they are not going to last until 07/30/21.  Patient verbalizes u/o information.  She would like to speak with me when she calls since I am familiar with situation.  ?

## 2021-05-11 NOTE — Progress Notes (Signed)
? ? ?05/12/2021 ?11:54 AM  ? ?Tracy Mann ?Oct 25, 1933 ?[id number] ? ?Referring provider: Jerrol Banana., MD ?[address] ?[address] ?[place],   [id number] ? ?Chief Complaint  ?Patient presents with  ? Cystitis  ? ?Urological history: ?1.  Renal cysts ?-contrast CT 2021 - Small low-density lesions in the bilateral kidneys too small for definitive characterization on the right but likely cysts.   ?A single interpolar presumed cyst measuring 6 mm on the left and a ?cyst in the lower pole measuring 12 mm by 12 mm ? ?2. IC ?-Managed with nitrofurantoin ? ?HPI: ?Tracy Mann is a 88 y.o. female who presents today for cystitis. ? ?She states her endocrinologist and her PCP stated that she should follow-up with [place] as she is had a recent BMP that noted a GFR of 33.   ? ?She explained to me that she also suffers with interstitial cystitis and that her symptoms when she is having a flare are dysuria and lower abdominal pain, but she is not having the symptoms today.  When she does have the symptoms she takes nitrofurantoin for a few days until they abate.  The nitrofurantoin does cause nausea, so she is wondering if there is another medication that she can take instead. ? ?She does have baseline mild stress urinary continence and intermittency. ? ?UA benign ? ?PVR 0 mL  ?   ? ?PMH: ?Past Medical History:  ?Diagnosis Date  ? Acute myocardial infarction of inferior wall (HCC)   ? a. 10/2005 - Presumed to be 2/2 to a coronary embolus in setting of persistent Afib-->Cath 2014 relatively nl cors, EF 45-50% w/ severe distal inferior/apical inferior HK w/ aneursymal appearance.  ? Arthritis   ? Chronic anticoagulation   ? a. warfarin.  ? Chronic atrial fibrillation (HCC)   ? a. CHA2DS2VASc = 6--> warfarin.  ? Chronic combined systolic and diastolic CHF (congestive heart failure) ([place])   ? a. 10/2012 Echo: EF 35-40%, diff HK, mild MR, mild biatrial enlargement;  b. 11/2012 EF 45-50% by LV gram; c. echo 07/2014: EF  40-45%, mod conc LVH, mod MR, severe biatrial enlargement, PASP 45 mm Hg c. Echo 01/2018 EF 45-50%, duffuse hypokinesis, mild MR, LA mod dilated, norm RVSF, dilated IVC  ? Essential hypertension   ? Hyperlipidemia, mixed   ? Mixed Ischemic/Nonischemic Cardiomyopathy   ? a. 10/2012 Echo: EF 35-40%;  b. 11/2012 EF 45-50% by LV gram.  ? Nonobstructive coronary artery disease   ? a. 2007 inferior wall MI thought to be secondary to thrombus from atrial fib b. 2014 cath with R dominant coronary arterial system with mild luminal irregularities c. Myoview 05/2015 old inferior MI, no new ischemic changes  ? Obesity   ? Thyroid disease   ? hypothyroidism  ? Type II diabetes mellitus ([place])   ? a. 07/2018 A1c 8.7 - long term insulin use  ? Venous insufficiency   ? Vitamin D deficiency   ? ? ?Surgical History: ?Past Surgical History:  ?Procedure Laterality Date  ? CARDIAC CATHETERIZATION  11/2012  ? [facility];EF 45-50%  ? CHOLECYSTECTOMY  1999  ? TUBAL LIGATION  1968  ? VESICOVAGINAL FISTULA CLOSURE W/ TAH  1996  ? ? ?Home Medications:  ?Allergies as of 05/12/2021   ? ?   Reactions  ? Duloxetine Other (See Comments)  ? Same as Paroxetine  ? Other Anaphylaxis, Swelling  ? 'tongue swelling'  ? Paroxetine Other (See Comments)  ? "Horrible headache," Stomach pain, Diarrhea  ?  Sulfa Antibiotics Anaphylaxis  ? Cephalexin   ? Blisters  ? Clarithromycin Other (See Comments)  ? Hives, headaches, hard time swelling, felt like throat was closing up ?Hives, headaches, hard time swelling, felt like throat was closing up  ? Diphenhydramine Other (See Comments)  ? Estrogens Conjugated   ? Estrogens Conjugated Other (See Comments)  ? Sulfonamide Derivatives Swelling  ? 'tongue swelling'  ? Nitrofurantoin Nausea Only  ? ?  ? ?  ?Medication List  ?  ? ?  ? Accurate as of May 12, 2021 11:59 PM. If you have any questions, ask your nurse or doctor.  ?  ?  ? ?  ? ?Accu-Chek Aviva Plus test strip ?Generic drug: glucose blood ?USE WITH METER TO CHECK GLUCOSE  BEFORE MEALS AND AT BEDTIME ?  ?Accu-Chek Softclix Lancets lancets ?SMARTSIG:Topical ?  ?albuterol 108 (90 Base) MCG/ACT inhaler ?Commonly known as: VENTOLIN HFA ?TAKE 2 PUFFS BY MOUTH EVERY 6 HOURS AS NEEDED FOR WHEEZE OR SHORTNESS OF BREATH ?  ?estradiol 0.1 MG/GM vaginal cream ?Commonly known as: ESTRACE ?Place 1 Applicatorful vaginally at bedtime. ?  ?ezetimibe 10 MG tablet ?Commonly known as: ZETIA ?TAKE 1 TABLET BY MOUTH EVERY DAY ?  ?flavoxATE 100 MG tablet ?Commonly known as: URISPAS ?Take 1 tablet (100 mg total) by mouth 2 (two) times daily as needed for bladder spasms. ?  ?fluticasone 50 MCG/ACT nasal spray ?Commonly known as: FLONASE ?Place 2 sprays into both nostrils daily. ?  ?HYDROcodone-acetaminophen 10-325 MG tablet ?Commonly known as: Norco ?Take 1 tablet by mouth every 8 (eight) hours as needed for severe pain. Must last 30 days. ?Start taking on: July 30, 2021 ?  ?insulin lispro 100 UNIT/ML KwikPen ?Commonly known as: HUMALOG ?Inject 10-25 Units into the skin in the morning, at noon, and at bedtime. ?  ?Lantus SoloStar 100 UNIT/ML Solostar Pen ?Generic drug: insulin glargine ?Inject 26 Units into the skin at bedtime. ?  ?levothyroxine 88 MCG tablet ?Commonly known as: SYNTHROID ?Take 88 mcg by mouth daily. ?  ?lisinopril 10 MG tablet ?Commonly known as: ZESTRIL ?Take 1 tablet (10 mg total) by mouth daily. ?  ?LORazepam 1 MG tablet ?Commonly known as: ATIVAN ?TAKE 1 TABLET(1 MG) BY MOUTH TWICE DAILY AS NEEDED ?  ?Magnesium 500 MG Caps ?Take 1,000 mg by mouth daily. ?  ?metolazone 2.5 MG tablet ?Commonly known as: ZAROXOLYN ?Take 1 tablet (2.5 mg total) by mouth as directed. Take two times a week with torsemide 40 mg ?  ?metoprolol succinate 25 MG 24 hr tablet ?Commonly known as: TOPROL-XL ?Take 1 tablet (25 mg total) by mouth daily. ?  ?nitroGLYCERIN 0.4 MG SL tablet ?Commonly known as: NITROSTAT ?Place 1 tablet (0.4 mg total) under the tongue every 5 (five) minutes as needed for chest pain. ?   ?NONFORMULARY OR COMPOUNDED ITEM ?See pharmacy note- Not to use on any open skin or wound. ?  ?potassium chloride SA 20 MEQ tablet ?Commonly known as: KLOR-CON M ?Take 1 tablet (20 mEq total) by mouth daily. ?  ?RELION PEN NEEDLE 31G/8MM 31G X 8 MM Misc ?Generic drug: Insulin Pen Needle ?Use with insulin pen three times daily ?  ?torsemide 20 MG tablet ?Commonly known as: DEMADEX ?Take 2 tablets (40 mg total) by mouth daily. ?  ?warfarin 4 MG tablet ?Commonly known as: COUMADIN ?Take as directed by the anticoagulation clinic. If you are unsure how to take this medication, talk to your nurse or doctor. ?Original instructions: Take daily or as directed by  physician ?  ? ?  ? ? ?Allergies:  ?Allergies  ?Allergen Reactions  ? Duloxetine Other (See Comments)  ?  Same as Paroxetine  ? Other Anaphylaxis and Swelling  ?  'tongue swelling'  ? Paroxetine Other (See Comments)  ?  "Horrible headache," Stomach pain, Diarrhea  ? Sulfa Antibiotics Anaphylaxis  ? Cephalexin   ?  Blisters  ? Clarithromycin Other (See Comments)  ?  Hives, headaches, hard time swelling, felt like throat was closing up ?Hives, headaches, hard time swelling, felt like throat was closing up  ? Diphenhydramine Other (See Comments)  ? Estrogens Conjugated   ? Estrogens Conjugated Other (See Comments)  ? Sulfonamide Derivatives Swelling  ?  'tongue swelling'  ? Nitrofurantoin Nausea Only  ? ? ?Family History: ?Family History  ?Problem Relation Age of Onset  ? Heart disease Mother   ? Thyroid disease Father   ? ? ?Social History:  reports that she has never smoked. She has never used smokeless tobacco. She reports that she does not drink alcohol and does not use drugs. ? ?ROS: ?Pertinent ROS in HPI ? ?Physical Exam: ?BP 111/60   Pulse 80   Ht '5\' 5"'$  (1.651 m)   Wt 204 lb (92.5 kg)   BMI 33.95 kg/m?   ?Constitutional:  Well nourished. Alert and oriented, No acute distress. ?HEENT: [place] AT, moist mucus membranes.  Trachea midline ?Cardiovascular: No  clubbing, cyanosis, or edema. ?Respiratory: Normal respiratory effort, no increased work of breathing. ?Neurologic: Grossly intact, no focal deficits, moving all 4 extremities. ?Psychiatric: Normal mood and affect.   ? ?

## 2021-05-12 ENCOUNTER — Ambulatory Visit: Payer: Medicare Other | Admitting: Urology

## 2021-05-12 ENCOUNTER — Encounter: Payer: Self-pay | Admitting: Urology

## 2021-05-12 VITALS — BP 111/60 | HR 80 | Ht 65.0 in | Wt 204.0 lb

## 2021-05-12 DIAGNOSIS — N281 Cyst of kidney, acquired: Secondary | ICD-10-CM | POA: Diagnosis not present

## 2021-05-12 DIAGNOSIS — N3020 Other chronic cystitis without hematuria: Secondary | ICD-10-CM | POA: Diagnosis not present

## 2021-05-12 LAB — URINALYSIS, COMPLETE
Bilirubin, UA: NEGATIVE
Glucose, UA: NEGATIVE
Ketones, UA: NEGATIVE
Nitrite, UA: NEGATIVE
Protein,UA: NEGATIVE
RBC, UA: NEGATIVE
Specific Gravity, UA: 1.02 (ref 1.005–1.030)
Urobilinogen, Ur: 0.2 mg/dL (ref 0.2–1.0)
pH, UA: 6.5 (ref 5.0–7.5)

## 2021-05-12 LAB — BLADDER SCAN AMB NON-IMAGING

## 2021-05-12 LAB — MICROSCOPIC EXAMINATION: Bacteria, UA: NONE SEEN

## 2021-05-13 LAB — TOXASSURE SELECT 13 (MW), URINE

## 2021-05-16 ENCOUNTER — Encounter: Payer: Self-pay | Admitting: Cardiovascular Disease

## 2021-05-16 ENCOUNTER — Ambulatory Visit: Payer: Medicare Other | Admitting: Cardiovascular Disease

## 2021-05-16 VITALS — BP 94/52 | HR 77 | Ht 65.5 in | Wt 205.4 lb

## 2021-05-16 DIAGNOSIS — I5042 Chronic combined systolic (congestive) and diastolic (congestive) heart failure: Secondary | ICD-10-CM

## 2021-05-16 DIAGNOSIS — I482 Chronic atrial fibrillation, unspecified: Secondary | ICD-10-CM | POA: Diagnosis not present

## 2021-05-16 DIAGNOSIS — I25118 Atherosclerotic heart disease of native coronary artery with other forms of angina pectoris: Secondary | ICD-10-CM | POA: Diagnosis not present

## 2021-05-16 DIAGNOSIS — I10 Essential (primary) hypertension: Secondary | ICD-10-CM | POA: Diagnosis not present

## 2021-05-16 LAB — CULTURE, URINE COMPREHENSIVE

## 2021-05-16 NOTE — Progress Notes (Signed)
Cardiology Office Note ? ?Date:  05/16/2021  ? ?ID:  NAVIE LAMOREAUX, DOB [birth year], MRN [id number] ? ?PCP:  Jerrol Banana., MD  ? ?Chief Complaint  ?Patient presents with  ? 6 month follow up   ?  Patient c/o not feeling well since last Thursday, May 12, 2021 with symptoms of shortness of breath, dizziness, leg weakness and chest pressure. Medications reviewed by the patient verbally.   ? ? ?HPI:  ?Ms. Grandinetti is a 88 year old woman with a PMH significant for  ?chronic atrial fibrillation on Coumadin therapy,  ?diastolic and systolic dysfunction, EF 40 to 45% on echo 01/2018 ? hypertension,  ?hyperlipidemia, ? morbid obesity,   ?moderate pulmonary hypertension ?inferior wall myocardial infarction felt to be secondary to a thrombus from atrial fib in October 2007 at [facility],  ?episodes of chest pain  ?Ejection fraction 30 to 35%, moderately elevated right heart pressures, July 16, 2020 following COVID ?Medication intolerances ?who presents for followup Of her coronary artery disease and leg edema. ? ?LOV 12/2020 ?On today's visit, she reports that she is not feeling well since last Thursday, May 12, 2021 with symptoms of shortness of breath, dizziness, leg weakness and chest pressure. ?Etiology of symptoms unclear, ? ?SOB since last Thursday ?Feels weak ?Spending more time in bed ?Wonders if she is fighting a bug ?Urine culture negative ? ?Eating less over the past month or 2 ?Weight down 100 kg on her last clinic visit to 93 kg ?Sugars 130 ? ?A1c 8.0 in feb 2023 ?TSH 0.016 ? ?Current diuretic regiment ?Metolazone 2x a week ?Torsemide 20 mg daily ?No edema ? ?Previous diuretic regimen as below ?For weight >214: Take metolazone 2.5 mg , 30 min later take torsemide 40 mg ?For weight 211 to 214: take torsemide 40 mg daily ?For weight <211, take torsemide 20 mg daily ? ?Her current weight is 205 pounds ? ?Sedentary, no regular exercise ? ?EKG personally reviewed by myself on todays  visit ?Atrial fib interventricular conduction delay, left bundle branch block rate 85 bpm ? ?Other past medical history reviewed ?Previous attempt to use Carvedilol 3.25 twice daily started, Entresto 24/26 unsuccessful secondary to intolerance ?Went back on lisinopril, stopped the coreg ? ?COVID infection early 2022 ? ?Echocardiogram July 16, 2020  ? 1. Left ventricular ejection fraction, by estimation, is 30 to 35%. The  ?left ventricle has moderately decreased function. The left ventricle  ?demonstrates global hypokinesis. The left ventricular internal cavity size  ?was moderately dilated. Left  ?ventricular diastolic parameters are indeterminate.  ? 2. Right ventricular systolic function is mildly reduced. The right  ?ventricular size is mildly enlarged. There is moderately elevated  ?pulmonary artery systolic pressure. The estimated right ventricular  ?systolic pressure is 14.9 mmHg.  ? 3. Left atrial size was moderately dilated.  ? 4. The mitral valve is normal in structure. Moderate mitral valve  ?regurgitation.  ? 5. The inferior vena cava is dilated in size with <50% respiratory  ?variability, suggesting right atrial pressure of 15 mmHg.  ? ?Does not use her lymphedema compression pumps ? ?HBA1C 9.1 on labs 12/2018 ?HBA1C 8.3 on 05/20/2019 ? ?PMH:   has a past medical history of Acute myocardial infarction of inferior wall ([place]), Arthritis, Chronic anticoagulation, Chronic atrial fibrillation ([place]), Chronic combined systolic and diastolic CHF (congestive heart failure) ([place]), Essential hypertension, Hyperlipidemia, mixed, Mixed Ischemic/Nonischemic Cardiomyopathy, Nonobstructive coronary artery disease, Obesity, Thyroid disease, Type II diabetes mellitus ([place]), Venous insufficiency, and Vitamin D deficiency. ? ?  PSH:    ?Past Surgical History:  ?Procedure Laterality Date  ? CARDIAC CATHETERIZATION  11/2012  ? [facility];EF 45-50%  ? CHOLECYSTECTOMY  1999  ? TUBAL LIGATION  1968  ? VESICOVAGINAL FISTULA CLOSURE W/  TAH  1996  ? ? ?Current Outpatient Medications  ?Medication Sig Dispense Refill  ? ACCU-CHEK AVIVA PLUS test strip USE WITH METER TO CHECK GLUCOSE BEFORE MEALS AND AT BEDTIME 200 strip 12  ? Accu-Chek Softclix Lancets lancets SMARTSIG:Topical    ? albuterol (VENTOLIN HFA) 108 (90 Base) MCG/ACT inhaler TAKE 2 PUFFS BY MOUTH EVERY 6 HOURS AS NEEDED FOR WHEEZE OR SHORTNESS OF BREATH 8.5 each 2  ? ezetimibe (ZETIA) 10 MG tablet TAKE 1 TABLET BY MOUTH EVERY DAY 90 tablet 2  ? flavoxATE (URISPAS) 100 MG tablet Take 1 tablet (100 mg total) by mouth 2 (two) times daily as needed for bladder spasms. 60 tablet 11  ? fluticasone (FLONASE) 50 MCG/ACT nasal spray Place 2 sprays into both nostrils daily. 48 mL 3  ? [START ON 07/30/2021] HYDROcodone-acetaminophen (NORCO) 10-325 MG tablet Take 1 tablet by mouth every 8 (eight) hours as needed for severe pain. Must last 30 days. 60 tablet 0  ? insulin glargine (LANTUS SOLOSTAR) 100 UNIT/ML Solostar Pen Inject 26 Units into the skin at bedtime.    ? insulin lispro (HUMALOG) 100 UNIT/ML KwikPen Inject 10-25 Units into the skin in the morning, at noon, and at bedtime.    ? Insulin Pen Needle (RELION PEN NEEDLE 31G/8MM) 31G X 8 MM MISC Use with insulin pen three times daily 100 each 12  ? levothyroxine (SYNTHROID) 88 MCG tablet Take 88 mcg by mouth daily.    ? lisinopril (ZESTRIL) 10 MG tablet Take 1 tablet (10 mg total) by mouth daily. 90 tablet 3  ? LORazepam (ATIVAN) 1 MG tablet TAKE 1 TABLET(1 MG) BY MOUTH TWICE DAILY AS NEEDED 60 tablet 1  ? Magnesium 500 MG CAPS Take 1,000 mg by mouth daily.     ? metolazone (ZAROXOLYN) 2.5 MG tablet Take 1 tablet (2.5 mg total) by mouth as directed. Take two times a week with torsemide 40 mg 90 tablet 1  ? metoprolol succinate (TOPROL-XL) 25 MG 24 hr tablet Take 1 tablet (25 mg total) by mouth daily. 90 tablet 3  ? nitroGLYCERIN (NITROSTAT) 0.4 MG SL tablet Place 1 tablet (0.4 mg total) under the tongue every 5 (five) minutes as needed for chest  pain. 25 tablet 1  ? NONFORMULARY OR COMPOUNDED ITEM See pharmacy note- Not to use on any open skin or wound. 120 each 3  ? potassium chloride SA (KLOR-CON) 20 MEQ tablet Take 1 tablet (20 mEq total) by mouth daily. 90 tablet 3  ? torsemide (DEMADEX) 20 MG tablet Take 2 tablets (40 mg total) by mouth daily. 200 tablet 4  ? warfarin (COUMADIN) 4 MG tablet Take daily or as directed by physician 90 tablet 4  ? estradiol (ESTRACE) 0.1 MG/GM vaginal cream Place 1 Applicatorful vaginally at bedtime. (Patient not taking: Reported on 05/12/2021) 42.5 g 0  ? ?No current facility-administered medications for this visit.  ? ? ?Allergies:   Duloxetine, Other, Paroxetine, Sulfa antibiotics, Cephalexin, Clarithromycin, Diphenhydramine, Estrogens conjugated, Estrogens conjugated, Sulfonamide derivatives, and Nitrofurantoin  ? ?Social History:  The patient  reports that she has never smoked. She has never used smokeless tobacco. She reports that she does not drink alcohol and does not use drugs.  ? ?Family History:   family history includes Heart disease in  her mother; Thyroid disease in her father.  ? ? ?Review of Systems: ?Review of Systems  ?Constitutional:  Positive for malaise/fatigue and weight loss.  ?HENT: Negative.    ?Respiratory:  Positive for shortness of breath.   ?Cardiovascular: Negative.   ?Gastrointestinal: Negative.   ?Musculoskeletal: Negative.   ?     Leg weakness  ?Neurological: Negative.   ?Psychiatric/Behavioral: Negative.    ?All other systems reviewed and are negative. ? ?PHYSICAL EXAM: ?VS:  BP (!) 94/52 (BP Location: Left Arm, Patient Position: Sitting, Cuff Size: Normal)   Pulse 77   Ht 5' 5.5" (1.664 m)   Wt 93.2 kg   SpO2 94%   BMI 33.66 kg/m?  , BMI Body mass index is 33.66 kg/m?Marland Kitchen ?Constitutional:  oriented to person, place, and time. No distress.  ?HENT:  ?Head: Grossly normal ?Eyes:  no discharge. No scleral icterus.  ?Neck: No JVD, no carotid bruits  ?Cardiovascular: Irregularly irregular, no  murmurs appreciated ?Pulmonary/Chest: Clear to auscultation bilaterally, no wheezes or rails ?Abdominal: Soft.  no distension.  no tenderness.  ?Musculoskeletal: Normal range of motion ?Neurological:  normal muscle tone.

## 2021-05-16 NOTE — Patient Instructions (Addendum)
Medication Instructions:  ?No changes ? ?If you need a refill on your cardiac medications before your next appointment, please call your pharmacy.  ? ? ?Lab work: ?- Your physician recommends that you have lab work today: CMP and BNP ? ? ?Testing/Procedures: ? ?1) Echocardiogram: ?- Your physician has requested that you have an echocardiogram. Echocardiography is a painless test that uses sound waves to create images of your heart. It provides your doctor with information about the size and shape of your heart and how well your heart?s chambers and valves are working. This procedure takes approximately one hour. There are no restrictions for this procedure. There is a possibility that an IV may need to be started during your test to inject an image enhancing agent. This is done to obtain more optimal pictures of your heart. Therefore we ask that you do at least drink some water prior to coming in to hydrate your veins.  ? ? ?Follow-Up: ?At [facility], you and your health needs are our priority.  As part of our continuing mission to provide you with exceptional heart care, we have created designated Provider Care Teams.  These Care Teams include your primary Cardiologist (physician) and Advanced Practice Providers (APPs -  Physician Assistants and Nurse Practitioners) who all work together to provide you with the care you need, when you need it. ? ?You will need a follow up appointment in 3 months, APP ok ? ?Providers on your designated Care Team:   ?Murray Hodgkins, NP ?Christell Faith, PA-C ?Cadence Kathlen Mody, PA-C ? ?COVID-19 Vaccine Information can be found at: [email or web address].[email or web address] For questions related to vaccine distribution or appointments, please email [email or web address] or call [phone].  ? ?

## 2021-05-17 ENCOUNTER — Telehealth: Payer: Self-pay | Admitting: Emergency Medicine

## 2021-05-17 ENCOUNTER — Other Ambulatory Visit: Payer: Self-pay | Admitting: Cardiovascular Disease

## 2021-05-17 DIAGNOSIS — I5042 Chronic combined systolic (congestive) and diastolic (congestive) heart failure: Secondary | ICD-10-CM

## 2021-05-17 LAB — COMPREHENSIVE METABOLIC PANEL
ALT: 12 IU/L (ref 0–32)
AST: 27 IU/L (ref 0–40)
Albumin/Globulin Ratio: 1.3 (ref 1.2–2.2)
Albumin: 4.1 g/dL (ref 3.6–4.6)
Alkaline Phosphatase: 113 IU/L (ref 44–121)
BUN/Creatinine Ratio: 27 (ref 12–28)
BUN: 52 mg/dL — ABNORMAL HIGH (ref 8–27)
Bilirubin Total: 0.7 mg/dL (ref 0.0–1.2)
CO2: 20 mmol/L (ref 20–29)
Calcium: 9.8 mg/dL (ref 8.7–10.3)
Chloride: 106 mmol/L (ref 96–106)
Creatinine, Ser: 1.96 mg/dL — ABNORMAL HIGH (ref 0.57–1.00)
Globulin, Total: 3.2 g/dL (ref 1.5–4.5)
Glucose: 145 mg/dL — ABNORMAL HIGH (ref 70–99)
Potassium: 5.1 mmol/L (ref 3.5–5.2)
Sodium: 148 mmol/L — ABNORMAL HIGH (ref 134–144)
Total Protein: 7.3 g/dL (ref 6.0–8.5)
eGFR: 25 mL/min/{1.73_m2} — ABNORMAL LOW (ref >59)

## 2021-05-17 LAB — BRAIN NATRIURETIC PEPTIDE: BNP: 203.9 pg/mL — ABNORMAL HIGH (ref 0.0–100.0)

## 2021-05-17 NOTE — Telephone Encounter (Signed)
-----   Message from Minna Merritts, MD sent at 05/17/2021  9:05 AM EDT ----- ?Lab work reviewed ?Appears to be in acute renal failure, possibly from too much diuretic ?That would explain the significant weight loss and possibly her malaise, not feeling well in general ?--Would recommend we stop the metolazone, would not take this twice a week scheduled ?-Would stop the torsemide this week, consider restarting the 20 mg daily as she is currently taking on Saturday, April 22 or Sunday if she already took torsemide today ?--Would only take the metolazone sparingly for ankle swelling  ?

## 2021-05-17 NOTE — Telephone Encounter (Signed)
Spoke with patient.  ? ?Reviewed results and reccs.  ? ?Pt will:  ? ?- Stop taking metolazone  ?- Hold torsemide until Saturday, April 22 as she hasn't taken it today ?- Have labs drawn in 2 weeks to recheck BMP  ? ?Order for BMP placed.  ?

## 2021-05-17 NOTE — Telephone Encounter (Signed)
Due 5/2 .  Will fu soon to remind or schedule.  ?

## 2021-05-18 ENCOUNTER — Ambulatory Visit: Payer: Medicare Other

## 2021-05-18 ENCOUNTER — Ambulatory Visit (INDEPENDENT_AMBULATORY_CARE_PROVIDER_SITE_OTHER): Payer: Medicare Other

## 2021-05-18 DIAGNOSIS — I25118 Atherosclerotic heart disease of native coronary artery with other forms of angina pectoris: Secondary | ICD-10-CM

## 2021-05-18 DIAGNOSIS — I5042 Chronic combined systolic (congestive) and diastolic (congestive) heart failure: Secondary | ICD-10-CM | POA: Diagnosis not present

## 2021-05-18 LAB — ECHOCARDIOGRAM COMPLETE
AR max vel: 2.08 cm2
AV Area VTI: 2.33 cm2
AV Area mean vel: 1.89 cm2
AV Mean grad: 4 mmHg
AV Peak grad: 7.5 mmHg
Ao pk vel: 1.37 m/s
Calc EF: 41.2 %
MV M vel: 5.08 m/s
MV Peak grad: 103.2 mmHg
Radius: 0.3 cm
S' Lateral: 4.1 cm
Single Plane A2C EF: 42 %
Single Plane A4C EF: 45.4 %

## 2021-05-18 MED ORDER — PERFLUTREN LIPID MICROSPHERE
1.0000 mL | INTRAVENOUS | Status: AC | PRN
  Administered 2021-05-18: 4 mL via INTRAVENOUS

## 2021-05-18 NOTE — Addendum Note (Signed)
Addended by: Anselm Pancoast on: 05/18/2021 09:36 AM ? ? Modules accepted: Orders ? ?

## 2021-05-19 ENCOUNTER — Telehealth: Payer: Self-pay | Admitting: Cardiovascular Disease

## 2021-05-19 DIAGNOSIS — I5042 Chronic combined systolic (congestive) and diastolic (congestive) heart failure: Secondary | ICD-10-CM

## 2021-05-19 NOTE — Telephone Encounter (Signed)
Spoke with patient.  ? ?Patient reports that she saw her echo results and is very worried because in several places it uses the word "severe."  ? ?Pt reports that she has been having swelling in her ankles and abdomen. Unsure of what her current weight is.  ? ?She just wants to know MDs thoughts on the echo and know if there's something that he can do for her.  ? ?Advised patientt hat I will forward this to MD for review. Pt verbalized understanding and voiced appreciation for the call.  ?

## 2021-05-19 NOTE — Telephone Encounter (Signed)
Pt c/o swelling: STAT is pt has developed SOB within 24 hours ? ?If swelling, where is the swelling located? Ankles Abdomen  ? ?How much weight have you gained and in what time span? Not sure  ? ?Have you gained 3 pounds in a day or 5 pounds in a week? Unsure scale isnt working  ? ?Do you have a log of your daily weights (if so, list)? no ? ?Are you currently taking a fluid pill? yes ? ?Are you currently SOB? Yes increased audible on phone   ? ?Have you traveled recently? No  ? ?

## 2021-05-19 NOTE — Telephone Encounter (Signed)
Patient calling for echo results. She states she saw them on mychart and is very concerned. ?

## 2021-05-20 MED ORDER — DAPAGLIFLOZIN PROPANEDIOL 10 MG PO TABS: 10.0000 mg | ORAL_TABLET | Freq: Every day | ORAL | 3 refills | Status: DC

## 2021-05-20 NOTE — Addendum Note (Signed)
Addended by: Mila Merry on: 05/20/2021 08:14 AM ? ? Modules accepted: Orders ? ?

## 2021-05-20 NOTE — Telephone Encounter (Signed)
MDs comments from echo results:  ? ?"Echocardiogram relatively unchanged from prior study 2022  ?EF 30 to 35%  ?Still with mild regurgitation, tricuspid valve regurgitation, elevated right heart pressures  ?Seen previously  ?Pictures do not appear to suggest that she is dehydrated as my concern with her renal so echocardiogram pictures are helpful  ?--We will probably need to restart the torsemide daily as taking previously  ?Need to take metolazone sparingly for ankle swelling/leg edema  ?-----Would like to add Farxiga 10 mg daily, may help get some of the fluid out,  ?----Also would offer referral to advanced heart failure clinic in [place] that specialize in this kind of issue  ?--- BMP recheck in 2 -3 weeks" ? ?Called and spoke with patient. Reviewed results and recommendations. Pt verbalized understanding.  ? ?Pt will:  ? ?- Restart torsemide 40 mg daily  ?- Start farxiga 10 mg daily  ?- Await call from HF clinic to get scheduled ?- Go to [facility] for repeat labs in 2-3 weeks  ? ?Pt voiced appreciation for the call and understands that she can reach out to our office if she needs anything.  ?

## 2021-05-25 NOTE — Telephone Encounter (Signed)
Attempted to remind patient   will fu on Monday  ?

## 2021-05-30 ENCOUNTER — Ambulatory Visit: Payer: Medicare Other | Admitting: Medical

## 2021-05-30 ENCOUNTER — Telehealth: Payer: Self-pay | Admitting: Cardiovascular Disease

## 2021-05-30 ENCOUNTER — Ambulatory Visit
Admission: RE | Admit: 2021-05-30 | Discharge: 2021-05-30 | Disposition: A | Discharge status: Home / Self Care | Payer: Medicare Other | Source: Ambulatory Visit | Attending: Medical | Admitting: Medical

## 2021-05-30 ENCOUNTER — Encounter: Payer: Self-pay | Admitting: Medical

## 2021-05-30 ENCOUNTER — Other Ambulatory Visit
Admission: RE | Admit: 2021-05-30 | Discharge: 2021-05-30 | Disposition: A | Discharge status: Home / Self Care | Payer: Medicare Other | Source: Ambulatory Visit | Attending: Medical | Admitting: Medical

## 2021-05-30 VITALS — BP 136/80 | HR 67 | Ht 65.5 in | Wt 203.0 lb

## 2021-05-30 DIAGNOSIS — E782 Mixed hyperlipidemia: Secondary | ICD-10-CM

## 2021-05-30 DIAGNOSIS — I25118 Atherosclerotic heart disease of native coronary artery with other forms of angina pectoris: Secondary | ICD-10-CM

## 2021-05-30 DIAGNOSIS — I5042 Chronic combined systolic (congestive) and diastolic (congestive) heart failure: Secondary | ICD-10-CM

## 2021-05-30 DIAGNOSIS — G4733 Obstructive sleep apnea (adult) (pediatric): Secondary | ICD-10-CM

## 2021-05-30 DIAGNOSIS — R0609 Other forms of dyspnea: Secondary | ICD-10-CM

## 2021-05-30 DIAGNOSIS — I482 Chronic atrial fibrillation, unspecified: Secondary | ICD-10-CM

## 2021-05-30 DIAGNOSIS — R0789 Other chest pain: Secondary | ICD-10-CM

## 2021-05-30 DIAGNOSIS — Z789 Other specified health status: Secondary | ICD-10-CM

## 2021-05-30 LAB — BASIC METABOLIC PANEL
Anion gap: 10 (ref 5–15)
BUN: 33 mg/dL — ABNORMAL HIGH (ref 8–23)
CO2: 29 mmol/L (ref 22–32)
Calcium: 9.4 mg/dL (ref 8.9–10.3)
Chloride: 103 mmol/L (ref 98–111)
Creatinine, Ser: 0.99 mg/dL (ref 0.44–1.00)
GFR, Estimated: 56 mL/min — ABNORMAL LOW (ref >60)
Glucose, Bld: 125 mg/dL — ABNORMAL HIGH (ref 70–99)
Potassium: 4.4 mmol/L (ref 3.5–5.1)
Sodium: 142 mmol/L (ref 135–145)

## 2021-05-30 MED ORDER — PANTOPRAZOLE SODIUM 40 MG PO TBEC: 40.0000 mg | DELAYED_RELEASE_TABLET | Freq: Every day | ORAL | 11 refills | Status: DC

## 2021-05-30 NOTE — Telephone Encounter (Signed)
Patient c/o Palpitations:  High priority if patient c/o lightheadedness, shortness of breath, or chest pain ? ?How long have you had palpitations/irregular HR/ Afib? Are you having the symptoms now? Off and on  ? ?Are you currently experiencing lightheadedness, SOB or CP? SOB ? ?Do you have a history of afib (atrial fibrillation) or irregular heart rhythm? Yes  ? ?Have you checked your BP or HR? (document readings if available): 133/78; 69; 137/99; 76; 130/72; 63 ? ?Are you experiencing any other symptoms? dizziness  ?

## 2021-05-30 NOTE — Progress Notes (Signed)
?Cardiology Office Note:   ? ?Date:  06/02/2021  ? ?ID:  Tracy Mann, DOB Jul 13, 1933, MRN [id number] ? ?PCP:  Tracy Mann., MD  ?[place] Cardiologist:  Tracy Rogue, MD  ?[facility] Electrophysiologist:  None  ? ?Referring MD: Tracy Mann.,*  ? ?Chief Complaint: SOB and dizziness.  ? ?History of Present Illness:   ? ?Tracy Mann is a 88 y.o. female with a hx of permanent Afib on coumadin, chronic combined systolic and diastolic heart failure, pulmonary hypertension, hypertension, hyperlipidemia, morbid obesity, CAD s/p inferior wall MI 2007 felt to be secondary to thrombus from atrial fibrillation, chronic lower extremity edema with lymphedema pumps, and who presents today for increased shortness of breath. ? ?01/2018 echo LVEF 40 to 45%, diffuse hypokinesis, mild MR, moderate LAE. ?  ?Cardiac monitoring with Zio monitor 07/2018 with persistent atrial fibrillation with variable rates.  Pauses up to 4.2 seconds noted. ? ?Echo June 2022 showed LVEF 30-35%, mildly reduced RV function, enlarged RV Size, moderately dilated LA, mod MR ? ?Last seen 05/16/21 and reported SOB, dizziness, and leg weakness and chest pressure. She was euvolemic on exam. Labs were drawn. BNP 203 . CMP with Scr 1.96, BUN 52. Possibly dry. Echo was ordered which showed LVEF 30-35%. Wilder Glade was added and she was referred to advanced heart failure clinic.  ? ?Today, the patient reports SOB, dizziness. She says get suddenly SOB and has real deep breath. This is new since she saw Dr. Rockey Situ. She didn't want to go to the ER. Has some pain in the epigastric pain. May have acid reflux. Has breathing issues every day for the last week. Says water may invoke SOB.  ? ?Past Medical History:  ?Diagnosis Date  ? Acute myocardial infarction of inferior wall (HCC)   ? a. 10/2005 - Presumed to be 2/2 to a coronary embolus in setting of persistent Afib-->Cath 2014 relatively nl cors, EF 45-50% w/ severe distal inferior/apical  inferior HK w/ aneursymal appearance.  ? Arthritis   ? Chronic anticoagulation   ? a. warfarin.  ? Chronic atrial fibrillation (HCC)   ? a. CHA2DS2VASc = 6--> warfarin.  ? Chronic combined systolic and diastolic CHF (congestive heart failure) ([place])   ? a. 10/2012 Echo: EF 35-40%, diff HK, mild MR, mild biatrial enlargement;  b. 11/2012 EF 45-50% by LV gram; c. echo 07/2014: EF 40-45%, mod conc LVH, mod MR, severe biatrial enlargement, PASP 45 mm Hg c. Echo 01/2018 EF 45-50%, duffuse hypokinesis, mild MR, LA mod dilated, norm RVSF, dilated IVC  ? Essential hypertension   ? Hyperlipidemia, mixed   ? Mixed Ischemic/Nonischemic Cardiomyopathy   ? a. 10/2012 Echo: EF 35-40%;  b. 11/2012 EF 45-50% by LV gram.  ? Nonobstructive coronary artery disease   ? a. 2007 inferior wall MI thought to be secondary to thrombus from atrial fib b. 2014 cath with R dominant coronary arterial system with mild luminal irregularities c. Myoview 05/2015 old inferior MI, no new ischemic changes  ? Obesity   ? Thyroid disease   ? hypothyroidism  ? Type II diabetes mellitus ([place])   ? a. 07/2018 A1c 8.7 - long term insulin use  ? Venous insufficiency   ? Vitamin D deficiency   ? ? ?Past Surgical History:  ?Procedure Laterality Date  ? CARDIAC CATHETERIZATION  11/2012  ? [facility];EF 45-50%  ? CHOLECYSTECTOMY  1999  ? TUBAL LIGATION  1968  ? VESICOVAGINAL FISTULA CLOSURE W/ TAH  1996  ? ? ?  Current Medications: ?Current Meds  ?Medication Sig  ? ACCU-CHEK AVIVA PLUS test strip USE WITH METER TO CHECK GLUCOSE BEFORE MEALS AND AT BEDTIME  ? Accu-Chek Softclix Lancets lancets SMARTSIG:Topical  ? albuterol (VENTOLIN HFA) 108 (90 Base) MCG/ACT inhaler TAKE 2 PUFFS BY MOUTH EVERY 6 HOURS AS NEEDED FOR WHEEZE OR SHORTNESS OF BREATH  ? dapagliflozin propanediol (FARXIGA) 10 MG TABS tablet Take 1 tablet (10 mg total) by mouth daily before breakfast.  ? ezetimibe (ZETIA) 10 MG tablet TAKE 1 TABLET BY MOUTH EVERY DAY  ? flavoxATE (URISPAS) 100 MG tablet Take 1 tablet  (100 mg total) by mouth 2 (two) times daily as needed for bladder spasms.  ? fluticasone (FLONASE) 50 MCG/ACT nasal spray Place 2 sprays into both nostrils daily.  ? [START ON 07/30/2021] HYDROcodone-acetaminophen (NORCO) 10-325 MG tablet Take 1 tablet by mouth every 8 (eight) hours as needed for severe pain. Must last 30 days.  ? insulin glargine (LANTUS SOLOSTAR) 100 UNIT/ML Solostar Pen Inject 26 Units into the skin at bedtime.  ? insulin lispro (HUMALOG) 100 UNIT/ML KwikPen Inject 10-25 Units into the skin in the morning, at noon, and at bedtime.  ? Insulin Pen Needle (RELION PEN NEEDLE 31G/8MM) 31G X 8 MM MISC Use with insulin pen three times daily  ? levothyroxine (SYNTHROID) 88 MCG tablet Take 88 mcg by mouth daily.  ? lisinopril (ZESTRIL) 10 MG tablet Take 1 tablet (10 mg total) by mouth daily.  ? LORazepam (ATIVAN) 1 MG tablet TAKE 1 TABLET(1 MG) BY MOUTH TWICE DAILY AS NEEDED  ? Magnesium 500 MG CAPS Take 1,000 mg by mouth daily.   ? metolazone (ZAROXOLYN) 2.5 MG tablet Take 1 tablet (2.5 mg total) by mouth as directed. Take two times a week with torsemide 40 mg  ? metoprolol succinate (TOPROL-XL) 25 MG 24 hr tablet Take 1 tablet (25 mg total) by mouth daily.  ? nitroGLYCERIN (NITROSTAT) 0.4 MG SL tablet Place 1 tablet (0.4 mg total) under the tongue every 5 (five) minutes as needed for chest pain.  ? NONFORMULARY OR COMPOUNDED ITEM See pharmacy note- Not to use on any open skin or wound.  ? pantoprazole (PROTONIX) 40 MG tablet Take 1 tablet (40 mg total) by mouth daily.  ? potassium chloride SA (KLOR-CON) 20 MEQ tablet Take 1 tablet (20 mEq total) by mouth daily.  ? torsemide (DEMADEX) 20 MG tablet Take 2 tablets (40 mg total) by mouth daily.  ? warfarin (COUMADIN) 4 MG tablet Take daily or as directed by physician  ?  ? ?Allergies:   Duloxetine, Other, Paroxetine, Sulfa antibiotics, Cephalexin, Clarithromycin, Diphenhydramine, Estrogens conjugated, Estrogens conjugated, Sulfonamide derivatives, and  Nitrofurantoin  ? ?Social History  ? ?Socioeconomic History  ? Marital status: Widowed  ?  Spouse name: widowed  ? Number of children: 4  ? Years of education: 103  ? Highest education level: 12th grade  ?Occupational History  ? Occupation: retired  ?Tobacco Use  ? Smoking status: Never  ? Smokeless tobacco: Never  ?Vaping Use  ? Vaping Use: Never used  ?Substance and Sexual Activity  ? Alcohol use: No  ? Drug use: No  ? Sexual activity: Never  ?Other Topics Concern  ? Not on file  ?Social History Narrative  ? Widowed  ? Has children and grandchildren  ? Lives in [place]  ? ?Social Determinants of Health  ? ?Financial Resource Strain: Not on file  ?Food Insecurity: Not on file  ?Transportation Needs: Not on file  ?Physical Activity:  Not on file  ?Stress: Not on file  ?Social Connections: Not on file  ?  ? ?Family History: ?The patient's family history includes Heart disease in her mother; Thyroid disease in her father. ? ?ROS:   ?Please see the history of present illness.    ? All other systems reviewed and are negative. ? ?EKGs/Labs/Other Studies Reviewed:   ? ?The following studies were reviewed today: ? ?Echo 05/18/21 ? 1. Left ventricular ejection fraction, by estimation, is 30 to 35%. The  ?left ventricle has moderate to severely decreased function. The left  ?ventricle demonstrates global hypokinesis. The left ventricular internal  ?cavity size was mildly dilated. Left  ?ventricular diastolic parameters are indeterminate.  ? 2. Right ventricular systolic function is low normal. The right  ?ventricular size is moderately enlarged. There is moderately elevated  ?pulmonary artery systolic pressure. The estimated right ventricular  ?systolic pressure is 32.5 mmHg.  ? 3. Left atrial size was severely dilated.  ? 4. Right atrial size was severely dilated.  ? 5. The mitral valve is degenerative. Moderate to severe mitral valve  ?regurgitation.  ? 6. Tricuspid valve regurgitation is mild to moderate.  ? 7. The aortic  valve was not well visualized. Aortic valve regurgitation  ?is not visualized.  ? 8. The inferior vena cava is dilated in size with <50% respiratory  ?variability, suggesting right atrial pressure of 15 mmHg.  ?

## 2021-05-30 NOTE — Patient Instructions (Addendum)
Medication Instructions:  ?Your physician has recommended you make the following change in your medication:  ? ?START Protonix 40 mg once daily  ? ? ?*If you need a refill on your cardiac medications before your next appointment, please call your pharmacy* ? ? ?Lab Work: ?BMET today over at the [facility] entrance. Check in at registration.  ? ?If you have labs (blood work) drawn today and your tests are completely normal, you will receive your results only by: ?MyChart Message (if you have MyChart) OR ?A paper copy in the mail ?If you have any lab test that is abnormal or we need to change your treatment, we will call you to review the results. ? ? ?Testing/Procedures: ?A chest x-ray takes a picture of the organs and structures inside the chest, including the heart, lungs, and blood vessels. This test can show several things, including, whether the heart is enlarges; whether fluid is building up in the lungs; and whether pacemaker / defibrillator leads are still in place. ? ?Go to [facility] entrance and check in at registration.  ? ? ?Follow-Up: ?At [facility], you and your health needs are our priority.  As part of our continuing mission to provide you with exceptional heart care, we have created designated Provider Care Teams.  These Care Teams include your primary Cardiologist (physician) and Advanced Practice Providers (APPs -  Physician Assistants and Nurse Practitioners) who all work together to provide you with the care you need, when you need it. ? ? ?Your next appointment:   ?2 week(s) ? ?The format for your next appointment:   ?In Person ? ?Provider:   ?Ida Rogue, MD  ? ? ? ? ? ?Important Information About Sugar ? ? ? ? ?  ?

## 2021-05-30 NOTE — Telephone Encounter (Signed)
Called and spoke with patient.  ? ?Patient reports that she has been having periods for the last two days when she can't catch her breath, worse with changing positions. States that she'll start gasping and feel very dizzy when this happens. Reports that it's worse laying down.  ? ?Pt endorses swelling in her ankles, and feels very full in her abd, "I feel like my diaphragm is bering pushed up." Pt reports she has been compliant with her medications.  ? ?Pt has been referred to advanced HF clinic, but they have not called her yet.  ? ?Pt offered appt with Kathlen Mody today at 2:45. Pt accepted.  ? ?Pt give strict ER precautions that should her breathing worsen or be accompanied with chest pain prior to her appt that she should call 911.  ? ?Pt verbalized understanding of everything discussed and voiced appreciation for the call.  ?

## 2021-06-03 ENCOUNTER — Ambulatory Visit
Admission: RE | Admit: 2021-06-03 | Discharge: 2021-06-03 | Disposition: A | Discharge status: Home / Self Care | Payer: Medicare Other | Source: Ambulatory Visit | Attending: Urology | Admitting: Urology

## 2021-06-03 DIAGNOSIS — N281 Cyst of kidney, acquired: Secondary | ICD-10-CM | POA: Insufficient documentation

## 2021-06-06 ENCOUNTER — Other Ambulatory Visit: Payer: Self-pay | Admitting: Urology

## 2021-06-06 DIAGNOSIS — N281 Cyst of kidney, acquired: Secondary | ICD-10-CM

## 2021-06-06 NOTE — Progress Notes (Signed)
I put in orders for her to have a RUS in 6 months.  ?

## 2021-06-08 NOTE — Progress Notes (Signed)
**Note Mann-Identified via Obfuscation** Cardiology Office Note ? ?Date:  06/13/2021  ? ?ID:  [name], DOB 1933/06/20, MRN [id number] ? ?PCP:  Tracy Mann., MD  ? ?Chief Complaint  ?Patient presents with  ? 2 week follow up   ?  Patient c/o weakness & fatigue, shortness of breath, bilateral LE edema and chest pain. Patient had to stop Faxiga due to sneezing, coughing, runny eyes and nose. Medications reviewed by the patient verbally.   ? ? ?HPI:  ?[name] is a 88 year old woman with a PMH significant for  ?chronic atrial fibrillation on Coumadin therapy,  ?diastolic and systolic dysfunction, EF 40 to 45% on echo 01/2018 ? hypertension,  ?hyperlipidemia, ? morbid obesity,   ?moderate pulmonary hypertension ?inferior wall myocardial infarction felt to be secondary to a thrombus from atrial fib in October 2007 at [facility],  ?episodes of chest pain  ?Ejection fraction 30 to 35%, moderately elevated right heart pressures, July 16, 2020 following COVID ?Medication intolerances ?EF 30 to 35% on echo 05/18/21 ?who presents for followup Of her coronary artery disease and leg edema. ? ?Last seen by myself May 16, 2021 ?Was not feeling well on that visit, had acute renal failure, acute climbing creatinine ?We held the metolazone (she has been taking this twice a week scheduled) ?Echocardiogram April 19 with ejection fraction 30 to 35% ?Moderate to severely elevated right heart pressures ?Severe biatrial enlargement moderate to severe MR ?No dramatic changes compared to study dated June 22 ? ?Previous medication intolerances to Entresto, carvedilol ?On today's visit reports that unable to tolerate [place], makes her sneeze, cough 30 minutes after taking it with a runny nose and eye watering ? ?She does continue to take torsemide 20 daily, lisinopril 10 and metoprolol ?As above intolerance to several other cardiac medications ? ?Reports that she is drinking lots of Gatorade for upset stomach ?Weight trending higher ?Eating more,  weight down from 12/2021 ?Rare metolazone, "once last week" ? ?Not much activity, very sedentary ?Does groceries, has them delivered ?Daughter helps ? ?CR 1.96 several weeks ago down to 0.99 more recently ?A1c 8.0 in feb 2023 ?TSH 0.016 ? ?Her current weight is 205 pounds at home ? ?EKG personally reviewed by myself on todays visit ?Atrial fib rate 59 bpm interventricular conduction delay ? ?Other past medical history reviewed ?Previous attempt to use Carvedilol 3.25 twice daily started, Entresto 24/26 unsuccessful secondary to intolerance ?Went back on lisinopril, stopped the coreg ? ?COVID infection early 2022 ? ?Does not use her lymphedema compression pumps ? ?HBA1C 9.1 on labs 12/2018 ?HBA1C 8.3 on 05/20/2019 ? ?PMH:   has a past medical history of Acute myocardial infarction of inferior wall ([place]), Arthritis, Chronic anticoagulation, Chronic atrial fibrillation ([place]), Chronic combined systolic and diastolic CHF (congestive heart failure) ([place]), Essential hypertension, Hyperlipidemia, mixed, Mixed Ischemic/Nonischemic Cardiomyopathy, Nonobstructive coronary artery disease, Obesity, Thyroid disease, Type II diabetes mellitus ([place]), Venous insufficiency, and Vitamin D deficiency. ? ?PSH:    ?Past Surgical History:  ?Procedure Laterality Date  ? CARDIAC CATHETERIZATION  11/2012  ? [facility];EF 45-50%  ? CHOLECYSTECTOMY  1999  ? TUBAL LIGATION  1968  ? VESICOVAGINAL FISTULA CLOSURE W/ TAH  1996  ? ? ?Current Outpatient Medications  ?Medication Sig Dispense Refill  ? ACCU-CHEK AVIVA PLUS test strip USE WITH METER TO CHECK GLUCOSE BEFORE MEALS AND AT BEDTIME 200 strip 12  ? Accu-Chek Softclix Lancets lancets SMARTSIG:Topical    ? albuterol (VENTOLIN HFA) 108 (90 Base) MCG/ACT inhaler TAKE 2 PUFFS BY  MOUTH EVERY 6 HOURS AS NEEDED FOR WHEEZE OR SHORTNESS OF BREATH 8.5 each 2  ? ezetimibe (ZETIA) 10 MG tablet TAKE 1 TABLET BY MOUTH EVERY DAY 90 tablet 2  ? flavoxATE (URISPAS) 100 MG tablet Take 1 tablet (100 mg total) by mouth  2 (two) times daily as needed for bladder spasms. 60 tablet 11  ? fluticasone (FLONASE) 50 MCG/ACT nasal spray Place 2 sprays into both nostrils daily. 48 mL 3  ? [START ON 07/30/2021] HYDROcodone-acetaminophen (NORCO) 10-325 MG tablet Take 1 tablet by mouth every 8 (eight) hours as needed for severe pain. Must last 30 days. 60 tablet 0  ? insulin glargine (LANTUS SOLOSTAR) 100 UNIT/ML Solostar Pen Inject 26 Units into the skin at bedtime.    ? insulin lispro (HUMALOG) 100 UNIT/ML KwikPen Inject 10-25 Units into the skin in the morning, at noon, and at bedtime.    ? Insulin Pen Needle (RELION PEN NEEDLE 31G/8MM) 31G X 8 MM MISC Use with insulin pen three times daily 100 each 12  ? levothyroxine (SYNTHROID) 88 MCG tablet Take 88 mcg by mouth daily.    ? lisinopril (ZESTRIL) 10 MG tablet Take 1 tablet (10 mg total) by mouth daily. 90 tablet 3  ? LORazepam (ATIVAN) 1 MG tablet TAKE 1 TABLET(1 MG) BY MOUTH TWICE DAILY AS NEEDED 60 tablet 1  ? Magnesium 500 MG CAPS Take 1,000 mg by mouth daily.     ? metolazone (ZAROXOLYN) 2.5 MG tablet Take 1 tablet (2.5 mg total) by mouth as directed. Take two times a week with torsemide 40 mg 90 tablet 1  ? metoprolol succinate (TOPROL-XL) 25 MG 24 hr tablet Take 1 tablet (25 mg total) by mouth daily. 90 tablet 3  ? nitroGLYCERIN (NITROSTAT) 0.4 MG SL tablet Place 1 tablet (0.4 mg total) under the tongue every 5 (five) minutes as needed for chest pain. 25 tablet 1  ? NONFORMULARY OR COMPOUNDED ITEM See pharmacy note- Not to use on any open skin or wound. 120 each 3  ? pantoprazole (PROTONIX) 40 MG tablet Take 1 tablet (40 mg total) by mouth daily. 30 tablet 11  ? potassium chloride SA (KLOR-CON) 20 MEQ tablet Take 1 tablet (20 mEq total) by mouth daily. 90 tablet 3  ? warfarin (COUMADIN) 4 MG tablet Take daily or as directed by physician 90 tablet 4  ? dapagliflozin propanediol (FARXIGA) 10 MG TABS tablet Take 1 tablet (10 mg total) by mouth daily before breakfast. (Patient not taking:  Reported on 06/13/2021) 30 tablet 3  ? torsemide (DEMADEX) 20 MG tablet Take 1-2 tablets (20-40 mg total) by mouth as directed. 200 tablet 4  ? ?No current facility-administered medications for this visit.  ? ? ?Allergies:   Duloxetine, Other, Paroxetine, Sulfa antibiotics, Cephalexin, Clarithromycin, Diphenhydramine, Estrogens conjugated, Estrogens conjugated, Sulfonamide derivatives, and Nitrofurantoin  ? ?Social History:  The patient  reports that she has never smoked. She has never used smokeless tobacco. She reports that she does not drink alcohol and does not use drugs.  ? ?Family History:   family history includes Heart disease in her mother; Thyroid disease in her father.  ? ? ?Review of Systems: ?Review of Systems  ?Constitutional:  Positive for malaise/fatigue and weight loss.  ?HENT: Negative.    ?Respiratory:  Positive for shortness of breath.   ?Cardiovascular: Negative.   ?Gastrointestinal: Negative.   ?Musculoskeletal: Negative.   ?     Leg weakness  ?Neurological: Negative.   ?Psychiatric/Behavioral: Negative.    ?All  other systems reviewed and are negative. ? ?PHYSICAL EXAM: ?VS:  BP 110/60 (BP Location: Left Arm, Patient Position: Sitting, Cuff Size: Normal)   Pulse (!) 59   Ht 5' 5.5" (1.664 m)   Wt 207 lb (93.9 kg)   SpO2 91%   BMI 33.92 kg/m?  , BMI Body mass index is 33.92 kg/m?Marland Kitchen ?Constitutional:  oriented to person, place, and time. No distress.  ?HENT:  ?Head: Grossly normal ?Eyes:  no discharge. No scleral icterus.  ?Neck: No JVD, no carotid bruits  ?Cardiovascular: Irregularly irregular, no murmurs appreciated ?Trace pitting lower extremity edema bilaterally, bandage on right lower extremity shin ?Pulmonary/Chest: Clear to auscultation bilaterally, no wheezes or rails ?Abdominal: Soft.  no distension.  no tenderness.  ?Musculoskeletal: Normal range of motion ?Neurological:  normal muscle tone. Coordination normal. No atrophy ?Skin: Skin warm and dry ?Psychiatric: normal affect,  pleasant ? ?Recent Labs: ?07/16/2020: Hemoglobin 12.3; Platelets 320 ?05/16/2021: ALT 12; BNP 203.9 ?05/30/2021: BUN 33; Creatinine, Ser 0.99; Potassium 4.4; Sodium 142  ? ? ?Lipid Panel ?Wt Readings from Last 3 Encount

## 2021-06-13 ENCOUNTER — Ambulatory Visit: Payer: Medicare Other | Admitting: Cardiovascular Disease

## 2021-06-13 ENCOUNTER — Encounter: Payer: Self-pay | Admitting: Cardiovascular Disease

## 2021-06-13 VITALS — BP 110/60 | HR 59 | Ht 65.5 in | Wt 207.0 lb

## 2021-06-13 DIAGNOSIS — I482 Chronic atrial fibrillation, unspecified: Secondary | ICD-10-CM

## 2021-06-13 DIAGNOSIS — I5032 Chronic diastolic (congestive) heart failure: Secondary | ICD-10-CM

## 2021-06-13 MED ORDER — TORSEMIDE 20 MG PO TABS: 20.0000 mg | ORAL_TABLET | ORAL | 4 refills | Status: DC

## 2021-06-13 NOTE — Patient Instructions (Addendum)
Cut back on the soda ? ?Medication Instructions:  ?For weight >210: Take metolazone 2.5 mg , 30 min later take torsemide 40 mg ?For weight 207 to 209: take torsemide 40 mg daily ?For weight <206 , take torsemide 20 mg daily ? ?If you need a refill on your cardiac medications before your next appointment, please call your pharmacy.  ? ?Lab work: ?No new labs needed ? ?Testing/Procedures: ?No new testing needed ? ?Follow-Up: ?At [facility], you and your health needs are our priority.  As part of our continuing mission to provide you with exceptional heart care, we have created designated Provider Care Teams.  These Care Teams include your primary Cardiologist (physician) and Advanced Practice Providers (APPs -  Physician Assistants and Nurse Practitioners) who all work together to provide you with the care you need, when you need it. ? ?You will need a follow up appointment in 3 months ? ?Providers on your designated Care Team:   ?Murray Hodgkins, NP ?Christell Faith, PA-C ?Cadence Kathlen Mody, PA-C ? ?COVID-19 Vaccine Information can be found at: [email or web address] For questions related to vaccine distribution or appointments, please email [email or web address] or call [phone].  ? ?

## 2021-06-14 ENCOUNTER — Ambulatory Visit (INDEPENDENT_AMBULATORY_CARE_PROVIDER_SITE_OTHER): Payer: Medicare Other

## 2021-06-14 ENCOUNTER — Other Ambulatory Visit: Payer: Self-pay | Admitting: Emergency Medicine

## 2021-06-14 ENCOUNTER — Ambulatory Visit (INDEPENDENT_AMBULATORY_CARE_PROVIDER_SITE_OTHER): Payer: Medicare Other | Admitting: Family Medicine

## 2021-06-14 VITALS — BP 101/62 | HR 58 | Resp 18 | Wt 208.0 lb

## 2021-06-14 DIAGNOSIS — R0602 Shortness of breath: Secondary | ICD-10-CM | POA: Diagnosis not present

## 2021-06-14 DIAGNOSIS — R55 Syncope and collapse: Secondary | ICD-10-CM

## 2021-06-14 DIAGNOSIS — I25118 Atherosclerotic heart disease of native coronary artery with other forms of angina pectoris: Secondary | ICD-10-CM | POA: Diagnosis not present

## 2021-06-14 DIAGNOSIS — I482 Chronic atrial fibrillation, unspecified: Secondary | ICD-10-CM

## 2021-06-14 DIAGNOSIS — E1159 Type 2 diabetes mellitus with other circulatory complications: Secondary | ICD-10-CM

## 2021-06-14 DIAGNOSIS — I4821 Permanent atrial fibrillation: Secondary | ICD-10-CM | POA: Diagnosis not present

## 2021-06-14 DIAGNOSIS — E782 Mixed hyperlipidemia: Secondary | ICD-10-CM

## 2021-06-14 DIAGNOSIS — I509 Heart failure, unspecified: Secondary | ICD-10-CM

## 2021-06-14 DIAGNOSIS — R110 Nausea: Secondary | ICD-10-CM

## 2021-06-14 DIAGNOSIS — Z794 Long term (current) use of insulin: Secondary | ICD-10-CM

## 2021-06-14 LAB — POCT INR
INR: 2.6 (ref 2.0–3.0)
PT: 31

## 2021-06-14 NOTE — Progress Notes (Signed)
Established patient visit  I,April Miller,acting as a scribe for Wilhemena Durie, MD.,have documented all relevant documentation on the behalf of Wilhemena Durie, MD,as directed by  Wilhemena Durie, MD while in the presence of Wilhemena Durie, MD.   Patient: Tracy Mann   DOB: [birth year]   88 y.o. Female  MRN: [id number] Visit Date: 06/14/2021  Today's healthcare provider: Wilhemena Durie, MD   Chief Complaint  Patient presents with   Follow-up   Subjective    HPI  Patient is here for follow up on chronic problems.  Patient has several issues of her bothering her today. She states last night she had 2 episodes where she felt like she passed out while sitting on the couch.  No associated symptoms of nausea vomiting diarrhea headache or chest pain or palpitations.  No seizures.   Medications: Outpatient Medications Prior to Visit  Medication Sig   ACCU-CHEK AVIVA PLUS test strip USE WITH METER TO CHECK GLUCOSE BEFORE MEALS AND AT BEDTIME   Accu-Chek Softclix Lancets lancets SMARTSIG:Topical   albuterol (VENTOLIN HFA) 108 (90 Base) MCG/ACT inhaler TAKE 2 PUFFS BY MOUTH EVERY 6 HOURS AS NEEDED FOR WHEEZE OR SHORTNESS OF BREATH   dapagliflozin propanediol (FARXIGA) 10 MG TABS tablet Take 1 tablet (10 mg total) by mouth daily before breakfast. (Patient not taking: Reported on 06/13/2021)   ezetimibe (ZETIA) 10 MG tablet TAKE 1 TABLET BY MOUTH EVERY DAY   flavoxATE (URISPAS) 100 MG tablet Take 1 tablet (100 mg total) by mouth 2 (two) times daily as needed for bladder spasms.   fluticasone (FLONASE) 50 MCG/ACT nasal spray Place 2 sprays into both nostrils daily.   [START ON 07/30/2021] HYDROcodone-acetaminophen (NORCO) 10-325 MG tablet Take 1 tablet by mouth every 8 (eight) hours as needed for severe pain. Must last 30 days.   insulin glargine (LANTUS SOLOSTAR) 100 UNIT/ML Solostar Pen Inject 26 Units into the skin at bedtime.   insulin lispro (HUMALOG) 100 UNIT/ML  KwikPen Inject 10-25 Units into the skin in the morning, at noon, and at bedtime.   Insulin Pen Needle (RELION PEN NEEDLE 31G/8MM) 31G X 8 MM MISC Use with insulin pen three times daily   levothyroxine (SYNTHROID) 88 MCG tablet Take 88 mcg by mouth daily.   lisinopril (ZESTRIL) 10 MG tablet Take 1 tablet (10 mg total) by mouth daily.   LORazepam (ATIVAN) 1 MG tablet TAKE 1 TABLET(1 MG) BY MOUTH TWICE DAILY AS NEEDED   Magnesium 500 MG CAPS Take 1,000 mg by mouth daily.    metolazone (ZAROXOLYN) 2.5 MG tablet Take 1 tablet (2.5 mg total) by mouth as directed. Take two times a week with torsemide 40 mg   metoprolol succinate (TOPROL-XL) 25 MG 24 hr tablet Take 1 tablet (25 mg total) by mouth daily.   nitroGLYCERIN (NITROSTAT) 0.4 MG SL tablet Place 1 tablet (0.4 mg total) under the tongue every 5 (five) minutes as needed for chest pain.   NONFORMULARY OR COMPOUNDED ITEM See pharmacy note- Not to use on any open skin or wound.   pantoprazole (PROTONIX) 40 MG tablet Take 1 tablet (40 mg total) by mouth daily.   potassium chloride SA (KLOR-CON) 20 MEQ tablet Take 1 tablet (20 mEq total) by mouth daily.   torsemide (DEMADEX) 20 MG tablet Take 1-2 tablets (20-40 mg total) by mouth as directed.   warfarin (COUMADIN) 4 MG tablet Take daily or as directed by physician   No facility-administered medications prior  to visit.    Review of Systems  Constitutional:  Negative for appetite change, chills, fatigue and fever.  Respiratory:  Negative for chest tightness and shortness of breath.   Cardiovascular:  Negative for chest pain and palpitations.  Gastrointestinal:  Negative for abdominal pain, nausea and vomiting.  Neurological:  Negative for dizziness and weakness.   Last hemoglobin A1c Lab Results  Component Value Date   HGBA1C 8.2 (H) 12/31/2019       Objective    BP 101/62 (BP Location: Right Arm, Patient Position: Sitting, Cuff Size: Large)   Pulse (!) 58   Resp 18   Wt 208 lb (94.3 kg)    SpO2 (!) 89%   BMI 34.09 kg/m  BP Readings from Last 3 Encounters:  06/14/21 101/62  06/13/21 110/60  05/30/21 136/80   Wt Readings from Last 3 Encounters:  06/14/21 208 lb (94.3 kg)  06/13/21 207 lb (93.9 kg)  05/30/21 203 lb (92.1 kg)      Physical Exam Vitals reviewed.  Constitutional:      General: She is not in acute distress.    Appearance: Normal appearance. She is not ill-appearing.  HENT:     Head: Normocephalic.  Cardiovascular:     Rate and Rhythm: Normal rate and regular rhythm.     Pulses: Normal pulses.     Heart sounds: Normal heart sounds. No murmur heard.   No friction rub. No gallop.  Pulmonary:     Effort: Pulmonary effort is normal. No respiratory distress.     Breath sounds: Normal breath sounds. No stridor. No wheezing, rhonchi or rales.  Abdominal:     General: Bowel sounds are normal.     Palpations: Abdomen is soft.     Tenderness: There is no abdominal tenderness. There is no right CVA tenderness or left CVA tenderness.  Musculoskeletal:     Right lower leg: No edema.     Left lower leg: No edema.     Comments: 1+ lower extremity edema chronic  Skin:    General: Skin is warm and dry.     Comments: Very fair skin.  Neurological:     Mental Status: She is alert and oriented to person, place, and time.  Psychiatric:        Mood and Affect: Mood normal.        Behavior: Behavior normal.    CG unchanged from previous  Results for orders placed or performed in visit on 06/14/21  POCT INR  Result Value Ref Range   INR 2.6 2.0 - 3.0   PT 31.0     Assessment & Plan     1. Type 2 diabetes mellitus with other circulatory complication, with long-term current use of insulin ([place]) Followed by endocrinology. - Lipid panel - CBC w/Diff/Platelet  2. Mixed hyperlipidemia Zetia as patient states she is statin intolerant - Lipid panel - CBC w/Diff/Platelet  3. Atherosclerosis of native coronary artery of native heart with stable angina  pectoris ([place]) Risk factors treated as possible. - Lipid panel - CBC w/Diff/Platelet - Pro b natriuretic peptide (BNP)[facility]) - Comprehensive Metabolic Panel (CMET)  4. Permanent atrial fibrillation (HCC) On Coumadin - Lipid panel - CBC w/Diff/Platelet  5. SOB (shortness of breath)  - Lipid panel - CBC w/Diff/Platelet - EKG 12-Lead - Pro b natriuretic peptide (BNP)[facility]) - Comprehensive Metabolic Panel (CMET)  6. Chronic atrial fibrillation (HCC)  - Lipid panel - CBC w/Diff/Platelet - POCT INR -  Pro b natriuretic peptide (BNP)[facility]) - Comprehensive Metabolic Panel (CMET)  7. Syncope, unspecified syncope type Do not think there is a neurologic event.  Will refer back to cardiology for consideration of monitoring/ZIO - Lipid panel - CBC w/Diff/Platelet  8. Nausea Chronic problem - Lipid panel - CBC w/Diff/Platelet  9. Congestive heart failure, unspecified HF chronicity, unspecified heart failure type ([place]) O2 sat is 91 and dips down to 89% with walking. - Lipid panel - CBC w/Diff/Platelet   Return in about 29 days (around 07/13/2021).      I, Wilhemena Durie, MD, have reviewed all documentation for this visit. The documentation on 06/18/21 for the exam, diagnosis, procedures, and orders are all accurate and complete.    Belvin Gauss Cranford Mon, MD  [facility] [phone] (phone) [phone] (fax)  [place]

## 2021-06-14 NOTE — Patient Instructions (Signed)
Take '4mg'$  Sun, Wed. And Friday, and 3 mg all other days. Recheck in 4 weeks. ?

## 2021-06-14 NOTE — Progress Notes (Signed)
Incoming secure chat from Dr. Rockey Situ in response to a message from Dr. Rosanna Randy:  ? ?I can send her a monitor in the mail if that works it should [employer].  She is a tough 3 with lots of medication intolerances.  Hard to get her on the right medication.  Sometimes less is better. ? ?Order placed for zio. MyChart message sent to patient notifying patient of same and with written instructions. Patient to call office with any questions or concerns.  ?

## 2021-06-15 LAB — CBC WITH DIFFERENTIAL/PLATELET
Basophils Absolute: 0.1 10*3/uL (ref 0.0–0.2)
Basos: 1 %
EOS (ABSOLUTE): 0.3 10*3/uL (ref 0.0–0.4)
Eos: 4 %
Hematocrit: 40.9 % (ref 34.0–46.6)
Hemoglobin: 13.5 g/dL (ref 11.1–15.9)
Immature Grans (Abs): 0 10*3/uL (ref 0.0–0.1)
Immature Granulocytes: 0 %
Lymphocytes Absolute: 2 10*3/uL (ref 0.7–3.1)
Lymphs: 24 %
MCH: 30 pg (ref 26.6–33.0)
MCHC: 33 g/dL (ref 31.5–35.7)
MCV: 91 fL (ref 79–97)
Monocytes Absolute: 0.6 10*3/uL (ref 0.1–0.9)
Monocytes: 8 %
Neutrophils Absolute: 5.1 10*3/uL (ref 1.4–7.0)
Neutrophils: 63 %
Platelets: 181 10*3/uL (ref 150–450)
RBC: 4.5 x10E6/uL (ref 3.77–5.28)
RDW: 12.7 % (ref 11.7–15.4)
WBC: 8.1 10*3/uL (ref 3.4–10.8)

## 2021-06-15 LAB — LIPID PANEL
Chol/HDL Ratio: 3.2 ratio (ref 0.0–4.4)
Cholesterol, Total: 149 mg/dL (ref 100–199)
HDL: 47 mg/dL (ref >39)
LDL Chol Calc (NIH): 78 mg/dL (ref 0–99)
Triglycerides: 134 mg/dL (ref 0–149)
VLDL Cholesterol Cal: 24 mg/dL (ref 5–40)

## 2021-06-19 DIAGNOSIS — R55 Syncope and collapse: Secondary | ICD-10-CM | POA: Diagnosis not present

## 2021-06-22 ENCOUNTER — Telehealth: Payer: Self-pay | Admitting: Pain Medicine

## 2021-06-22 LAB — COMPREHENSIVE METABOLIC PANEL
ALT: 10 IU/L (ref 0–32)
AST: 22 IU/L (ref 0–40)
Albumin/Globulin Ratio: 1.3 (ref 1.2–2.2)
Albumin: 4 g/dL (ref 3.6–4.6)
Alkaline Phosphatase: 124 IU/L — ABNORMAL HIGH (ref 44–121)
BUN/Creatinine Ratio: 31 — ABNORMAL HIGH (ref 12–28)
BUN: 40 mg/dL — ABNORMAL HIGH (ref 8–27)
Bilirubin Total: 0.8 mg/dL (ref 0.0–1.2)
CO2: 23 mmol/L (ref 20–29)
Calcium: 9.2 mg/dL (ref 8.7–10.3)
Chloride: 99 mmol/L (ref 96–106)
Creatinine, Ser: 1.3 mg/dL — ABNORMAL HIGH (ref 0.57–1.00)
Globulin, Total: 3.1 g/dL (ref 1.5–4.5)
Glucose: 163 mg/dL — ABNORMAL HIGH (ref 70–99)
Potassium: 5.2 mmol/L (ref 3.5–5.2)
Sodium: 139 mmol/L (ref 134–144)
Total Protein: 7.1 g/dL (ref 6.0–8.5)
eGFR: 40 mL/min/{1.73_m2} — ABNORMAL LOW (ref >59)

## 2021-06-22 LAB — PRO B NATRIURETIC PEPTIDE: NT-Pro BNP: 1723 pg/mL — ABNORMAL HIGH (ref 0–738)

## 2021-06-22 NOTE — Telephone Encounter (Signed)
Spoke to patient regarding concerns of not having medication to cover her until July. She stated she had been suffering trying to cut back on medication  and had extra when she came in last. Was not given a prescription at that time. Has 2 weeks of medication left. Gave her an appointment to speak to DR. Naveira regarding medications. She had asked to see DR. Lateef and he declined at this time. She agreed to appointment 07-06-21 at 3pm. Her meds run out that day. Informed her to bring bottles.

## 2021-06-22 NOTE — Telephone Encounter (Signed)
Pt stated that she is in a lot of pain. Pt would like Lori to call her back. Please call Pt with an update.

## 2021-06-22 NOTE — Telephone Encounter (Signed)
Message sent to [facility] about taking this patient on.

## 2021-06-27 NOTE — Progress Notes (Signed)
ADVANCED HF CLINIC CONSULT NOTE  Referring Physician: Primary Care: Jerrol Banana., MD Primary Cardiologist: Ida Rogue, MD   HPI:   [name] is an 88 y/o woman with morbid obesity, DM2, chronic AF, previous embolic IMI (2426), OCA (intolerant of CPAP) and systolic HF with EF 83-41% due to mixed iCM/NICM referred by Dr. Rockey Situ for further evaluation of her HF and pulmonary HTN.Marland Kitchen  Apparently had IMI in 2007 which was felt to be due to embolic event from AF.   Last cath 2014 minimal CAD. EF 45-50% with AK/aneurysm of distal/apical inferior wall  Echo 1/20 EF 40-45% Echo 6/22 EF 30-35% moderate MR Myoview 6/22 EF 39% small infero-apical scar Echo 4/23 EF 30-35% RV low normal moderate to severe MR, mod TR  RVSP 48mHG   Has been followed closely by Dr. GRockey Situfor her HF. Management has been complicated by a high fluid intake and intolerances to almost all GDMT including carvedilol, FWilder Glade Entresto and spiro. Currently takes Lisinopril '10mg'$  daily, Toprol 25 and torsemide '20mg'$  with metolazone 2x/week   Says she feels ok. Says her legs always remain swollen. Takes metolazone about 2x/week when her legs swell more. SOB with any exertion. Uses a WC or walker to get around depending on how she has to go. Lives alone. 2 sons [place] son [place] 1 daughter in [place] CP. Drinks at least 4 glasses of water per day.    Review of Systems: [y] = yes, '[ ]'$  = no   General: Weight gain '[ ]'$ ; Weight loss '[ ]'$ ; Anorexia '[ ]'$ ; Fatigue [ y]; Fever '[ ]'$ ; Chills '[ ]'$ ; Weakness [ y]  Cardiac: Chest pain/pressure '[ ]'$ ; Resting SOB '[ ]'$ ; Exertional SOB [ y]; Orthopnea [ y]; Pedal Edema [ y]; Palpitations '[ ]'$ ; Syncope '[ ]'$ ; Presyncope '[ ]'$ ; Paroxysmal nocturnal dyspnea'[ ]'$   Pulmonary: Cough '[ ]'$ ; Wheezing'[ ]'$ ; Hemoptysis'[ ]'$ ; Sputum '[ ]'$ ; Snoring '[ ]'$   GI: Vomiting'[ ]'$ ; Dysphagia'[ ]'$ ; Melena'[ ]'$ ; Hematochezia '[ ]'$ ; Heartburn'[ ]'$ ; Abdominal pain '[ ]'$ ; Constipation '[ ]'$ ; Diarrhea '[ ]'$ ; BRBPR '[ ]'$   GU: Hematuria'[ ]'$ ;  Dysuria '[ ]'$ ; Nocturia'[ ]'$   Vascular: Pain in legs with walking '[ ]'$ ; Pain in feet with lying flat '[ ]'$ ; Non-healing sores '[ ]'$ ; Stroke '[ ]'$ ; TIA '[ ]'$ ; Slurred speech '[ ]'$ ;  Neuro: Headaches'[ ]'$ ; Vertigo'[ ]'$ ; Seizures'[ ]'$ ; Paresthesias'[ ]'$ ;Blurred vision '[ ]'$ ; Diplopia '[ ]'$ ; Vision changes '[ ]'$   Ortho/Skin: Arthritis [Blue.Reese]; Joint pain [Blue.Reese]; Muscle pain '[ ]'$ ; Joint swelling '[ ]'$ ; Back Pain [Blue.Reese]; Rash '[ ]'$   Psych: Depression'[ ]'$ ; Anxiety'[ ]'$   Heme: Bleeding problems '[ ]'$ ; Clotting disorders '[ ]'$ ; Anemia '[ ]'$   Endocrine: Diabetes [ y]; Thyroid dysfunction[ y]   Past Medical History:  Diagnosis Date   Acute myocardial infarction of inferior wall ([place]    a. 10/2005 - Presumed to be 2/2 to a coronary embolus in setting of persistent Afib-->Cath 2014 relatively nl cors, EF 45-50% w/ severe distal inferior/apical inferior HK w/ aneursymal appearance.   Arthritis    Chronic anticoagulation    a. warfarin.   Chronic atrial fibrillation (HCC)    a. CHA2DS2VASc = 6--> warfarin.   Chronic combined systolic and diastolic CHF (congestive heart failure) ([place]    a. 10/2012 Echo: EF 35-40%, diff HK, mild MR, mild biatrial enlargement;  b. 11/2012 EF 45-50% by LV gram; c. echo 07/2014: EF 40-45%, mod conc LVH, mod MR, severe biatrial enlargement, PASP  45 mm Hg c. Echo 01/2018 EF 45-50%, duffuse hypokinesis, mild MR, LA mod dilated, norm RVSF, dilated IVC   Essential hypertension    Hyperlipidemia, mixed    Mixed Ischemic/Nonischemic Cardiomyopathy    a. 10/2012 Echo: EF 35-40%;  b. 11/2012 EF 45-50% by LV gram.   Nonobstructive coronary artery disease    a. 2007 inferior wall MI thought to be secondary to thrombus from atrial fib b. 2014 cath with R dominant coronary arterial system with mild luminal irregularities c. Myoview 05/2015 old inferior MI, no new ischemic changes   Obesity    Thyroid disease    hypothyroidism   Type II diabetes mellitus ([place])    a. 07/2018 A1c 8.7 - long term insulin use   Venous insufficiency     Vitamin D deficiency     Current Outpatient Medications  Medication Sig Dispense Refill   ACCU-CHEK AVIVA PLUS test strip USE WITH METER TO CHECK GLUCOSE BEFORE MEALS AND AT BEDTIME 200 strip 12   Accu-Chek Softclix Lancets lancets SMARTSIG:Topical     albuterol (VENTOLIN HFA) 108 (90 Base) MCG/ACT inhaler TAKE 2 PUFFS BY MOUTH EVERY 6 HOURS AS NEEDED FOR WHEEZE OR SHORTNESS OF BREATH 8.5 each 2   ezetimibe (ZETIA) 10 MG tablet TAKE 1 TABLET BY MOUTH EVERY DAY 90 tablet 2   flavoxATE (URISPAS) 100 MG tablet Take 1 tablet (100 mg total) by mouth 2 (two) times daily as needed for bladder spasms. 60 tablet 11   fluticasone (FLONASE) 50 MCG/ACT nasal spray Place 2 sprays into both nostrils daily. 48 mL 3   [START ON 07/30/2021] HYDROcodone-acetaminophen (NORCO) 10-325 MG tablet Take 1 tablet by mouth every 8 (eight) hours as needed for severe pain. Must last 30 days. 60 tablet 0   insulin glargine (LANTUS SOLOSTAR) 100 UNIT/ML Solostar Pen Inject 26 Units into the skin at bedtime.     insulin lispro (HUMALOG) 100 UNIT/ML KwikPen Inject 10-25 Units into the skin in the morning, at noon, and at bedtime.     Insulin Pen Needle (RELION PEN NEEDLE 31G/8MM) 31G X 8 MM MISC Use with insulin pen three times daily 100 each 12   levothyroxine (SYNTHROID) 88 MCG tablet Take 88 mcg by mouth daily.     lisinopril (ZESTRIL) 10 MG tablet Take 1 tablet (10 mg total) by mouth daily. 90 tablet 3   LORazepam (ATIVAN) 1 MG tablet TAKE 1 TABLET(1 MG) BY MOUTH TWICE DAILY AS NEEDED 60 tablet 1   Magnesium 500 MG CAPS Take 1,000 mg by mouth daily.      metolazone (ZAROXOLYN) 2.5 MG tablet Take 1 tablet (2.5 mg total) by mouth as directed. Take two times a week with torsemide 40 mg 90 tablet 1   nitroGLYCERIN (NITROSTAT) 0.4 MG SL tablet Place 1 tablet (0.4 mg total) under the tongue every 5 (five) minutes as needed for chest pain. 25 tablet 1   NONFORMULARY OR COMPOUNDED ITEM See pharmacy note- Not to use on any open skin  or wound. 120 each 3   pantoprazole (PROTONIX) 40 MG tablet Take 1 tablet (40 mg total) by mouth daily. (Patient taking differently: Take 40 mg by mouth daily as needed.) 30 tablet 11   potassium chloride SA (KLOR-CON) 20 MEQ tablet Take 1 tablet (20 mEq total) by mouth daily. 90 tablet 3   torsemide (DEMADEX) 20 MG tablet Take 1-2 tablets (20-40 mg total) by mouth as directed. 200 tablet 4   warfarin (COUMADIN) 4 MG tablet Take daily or  as directed by physician 90 tablet 4   No current facility-administered medications for this encounter.    Allergies  Allergen Reactions   Duloxetine Other (See Comments)    Same as Paroxetine   Other Anaphylaxis and Swelling    'tongue swelling'   Paroxetine Other (See Comments)    "Horrible headache," Stomach pain, Diarrhea   Sulfa Antibiotics Anaphylaxis   Cephalexin     Blisters   Clarithromycin Other (See Comments)    Hives, headaches, hard time swelling, felt like throat was closing up Hives, headaches, hard time swelling, felt like throat was closing up   Diphenhydramine Other (See Comments)   Estrogens Conjugated    Estrogens Conjugated Other (See Comments)   Sulfonamide Derivatives Swelling    'tongue swelling'   Nitrofurantoin Nausea Only      Social History   Socioeconomic History   Marital status: Widowed    Spouse name: widowed   Number of children: 4   Years of education: 14   Highest education level: 12th grade  Occupational History   Occupation: retired  Tobacco Use   Smoking status: Never   Smokeless tobacco: Never  Scientific laboratory technician Use: Never used  Substance and Sexual Activity   Alcohol use: No   Drug use: No   Sexual activity: Never  Other Topics Concern   Not on file  Social History Narrative   Widowed   Has children and grandchildren   Lives in [place] Determinants of Health   Financial Resource Strain: Not on file  Food Insecurity: Not on file  Transportation Needs: Not on file   Physical Activity: Not on file  Stress: Not on file  Social Connections: Not on file  Intimate Partner Violence: Not on file      Family History  Problem Relation Age of Onset   Heart disease Mother    Thyroid disease Father     Vitals:   06/29/21 1449  BP: 114/66  Pulse: 93  SpO2: 90%  Weight: 92.3 kg (203 lb 6.4 oz)   Wt Readings from Last 3 Encounters:  06/29/21 92.3 kg (203 lb 6.4 oz)  06/14/21 94.3 kg (208 lb)  06/13/21 93.9 kg (207 lb)    PHYSICAL EXAM: General:  Morbidly obese. No respiratory difficulty HEENT: normal Neck: supple. no JVD. Carotids 2+ bilat; no bruits. No lymphadenopathy or thryomegaly appreciated. Cor: PMI nondisplaced. Irregular rate & rhythm. 2/6 MR Lungs: decreased effort. Otherwise clear.  Abdomen: obese soft, nontender, nondistended. No hepatosplenomegaly. No bruits or masses. Good bowel sounds. Extremities: no cyanosis, clubbing, rash, 1+ edema + varicose veins Neuro: alert & oriented x 3, cranial nerves grossly intact. moves all 4 extremities w/o difficulty. Affect pleasant.   ASSESSMENT & PLAN:  1. Chronic systolic/diastolic HF due to mixed iCM/NICM  - h/o embolic IMI in 0240 - Cath 2014 no CAD - Echo 1/20 EF 40-45% - Myoview 6/22 EF 39% small infero-apical scar - Echo 4/23 EF 30-35%  RV low normal moderate to severe MR, mod TR  RVSP 16mHG  - echo suggestive of restrictive physiology  - NYHA III-IIIB - Volume status difficult to control due to excessive fluid intake. REDS mildly elevated to day at 38%  - Increase torsemide to 40 daily. Labs today and 1 week. Continue metolazone as needed - Currently takes Lisinopril '10mg'$  daily, Toprol 25 - Management c/b intolerances to almost all GDMT including carvedilol, Farxiga (severe sneezing), Entresto (dizziness) and spiro  2.Pulmonary HTN by echo -  Echo 4/23: RVSP 48mHG. RV dilated Function low normal - likely WHO Group 2 & 3 - continue to manage fluid status closely - intolerant of  CPAP - do not think RHC will help much in management at this juncture  3. Severe MR - functional. Likely atrial in nature - have discussed TEE with her to evaluate MV further for possible mTEER to help with volume management.   4. Chronic AF - rate controlled on warfarin   5. Morbid obesity - consider [id number]  DGlori Bickers MD  3:09 PM

## 2021-06-29 ENCOUNTER — Ambulatory Visit (HOSPITAL_COMMUNITY)
Admission: RE | Admit: 2021-06-29 | Discharge: 2021-06-29 | Disposition: A | Discharge status: Home / Self Care | Payer: Medicare Other | Source: Ambulatory Visit | Attending: Internal Medicine | Admitting: Internal Medicine

## 2021-06-29 ENCOUNTER — Encounter (HOSPITAL_COMMUNITY): Payer: Self-pay | Admitting: Internal Medicine

## 2021-06-29 VITALS — BP 114/66 | HR 93 | Wt 203.4 lb

## 2021-06-29 DIAGNOSIS — I340 Nonrheumatic mitral (valve) insufficiency: Secondary | ICD-10-CM

## 2021-06-29 DIAGNOSIS — I5032 Chronic diastolic (congestive) heart failure: Secondary | ICD-10-CM | POA: Diagnosis not present

## 2021-06-29 DIAGNOSIS — I428 Other cardiomyopathies: Secondary | ICD-10-CM | POA: Diagnosis not present

## 2021-06-29 DIAGNOSIS — L905 Scar conditions and fibrosis of skin: Secondary | ICD-10-CM | POA: Diagnosis not present

## 2021-06-29 DIAGNOSIS — I482 Chronic atrial fibrillation, unspecified: Secondary | ICD-10-CM | POA: Insufficient documentation

## 2021-06-29 DIAGNOSIS — Z79899 Other long term (current) drug therapy: Secondary | ICD-10-CM | POA: Insufficient documentation

## 2021-06-29 DIAGNOSIS — I252 Old myocardial infarction: Secondary | ICD-10-CM | POA: Diagnosis not present

## 2021-06-29 DIAGNOSIS — Z794 Long term (current) use of insulin: Secondary | ICD-10-CM | POA: Insufficient documentation

## 2021-06-29 DIAGNOSIS — R0602 Shortness of breath: Secondary | ICD-10-CM | POA: Insufficient documentation

## 2021-06-29 DIAGNOSIS — I5042 Chronic combined systolic (congestive) and diastolic (congestive) heart failure: Secondary | ICD-10-CM | POA: Insufficient documentation

## 2021-06-29 DIAGNOSIS — Z7901 Long term (current) use of anticoagulants: Secondary | ICD-10-CM | POA: Diagnosis not present

## 2021-06-29 DIAGNOSIS — I272 Pulmonary hypertension, unspecified: Secondary | ICD-10-CM | POA: Insufficient documentation

## 2021-06-29 DIAGNOSIS — I110 Hypertensive heart disease with heart failure: Secondary | ICD-10-CM | POA: Insufficient documentation

## 2021-06-29 DIAGNOSIS — I2510 Atherosclerotic heart disease of native coronary artery without angina pectoris: Secondary | ICD-10-CM | POA: Diagnosis not present

## 2021-06-29 DIAGNOSIS — E119 Type 2 diabetes mellitus without complications: Secondary | ICD-10-CM | POA: Diagnosis not present

## 2021-06-29 LAB — BRAIN NATRIURETIC PEPTIDE: B Natriuretic Peptide: 88 pg/mL (ref 0.0–100.0)

## 2021-06-29 LAB — BASIC METABOLIC PANEL
Anion gap: 8 (ref 5–15)
BUN: 34 mg/dL — ABNORMAL HIGH (ref 8–23)
CO2: 27 mmol/L (ref 22–32)
Calcium: 9.4 mg/dL (ref 8.9–10.3)
Chloride: 102 mmol/L (ref 98–111)
Creatinine, Ser: 1.27 mg/dL — ABNORMAL HIGH (ref 0.44–1.00)
GFR, Estimated: 41 mL/min — ABNORMAL LOW (ref >60)
Glucose, Bld: 125 mg/dL — ABNORMAL HIGH (ref 70–99)
Potassium: 4.8 mmol/L (ref 3.5–5.1)
Sodium: 137 mmol/L (ref 135–145)

## 2021-06-29 MED ORDER — TORSEMIDE 20 MG PO TABS: 40.0000 mg | ORAL_TABLET | Freq: Every day | ORAL | 6 refills | Status: DC

## 2021-06-29 NOTE — Progress Notes (Signed)
ReDS Vest / Clip - 06/29/21 1500       ReDS Vest / Clip   Station Marker C    Ruler Value 28    ReDS Value Range Moderate volume overload    ReDS Actual Value 38

## 2021-06-29 NOTE — Patient Instructions (Signed)
Increase Torsemide to 40 mg (2 tabs) Daily   Labs done today, your results will be available in MyChart, we will contact you for abnormal readings.  Your physician recommends that you return for lab work in: 1 week, this can be done at [place] recommends that you schedule a follow-up appointment in: 3 months  If you have any questions or concerns before your next appointment please send [place] a message through [place] or call our office at [phone].    TO LEAVE A MESSAGE FOR THE NURSE SELECT OPTION 2, PLEASE LEAVE A MESSAGE INCLUDING: YOUR NAME DATE OF BIRTH CALL BACK NUMBER REASON FOR CALL**this is important as we prioritize the call backs  YOU WILL RECEIVE A CALL BACK THE SAME DAY AS LONG AS YOU CALL BEFORE 4:00 PM  At the [facility], you and your health needs are our priority. As part of our continuing mission to provide you with exceptional heart care, we have created designated Provider Care Teams. These Care Teams include your primary Cardiologist (physician) and Advanced Practice Providers (APPs- Physician Assistants and Nurse Practitioners) who all work together to provide you with the care you need, when you need it.   You may see any of the following providers on your designated Care Team at your next follow up: Dr Glori Bickers Dr Haynes Kerns, NP Lyda Jester, [place] [facility] [place], [place] Audry Riles, PharmD   Please be sure to bring in all your medications bottles to every appointment.

## 2021-07-04 ENCOUNTER — Ambulatory Visit: Payer: Medicare Other | Admitting: Cardiovascular Disease

## 2021-07-05 NOTE — Progress Notes (Unsigned)
PROVIDER NOTE: Information contained herein reflects review and annotations entered in association with encounter. Interpretation of such information and data should be left to medically-trained personnel. Information provided to patient can be located elsewhere in the medical record under "Patient Instructions". Document created using STT-dictation technology, any transcriptional errors that may result from process are unintentional.    Patient: Tracy Mann  Service Category: E/M  Provider: Gaspar Cola, MD  DOB: November 13, 1933  DOS: 07/06/2021  Specialty: Interventional Pain Management  MRN: [id number]  Setting: Ambulatory outpatient  PCP: Jerrol Banana., MD  Type: Established Patient    Referring Provider: Jerrol Banana.,*  Location: Office  Delivery: Face-to-face     HPI  Tracy Mann, a 88 y.o. year old female, is here today because of her No primary diagnosis found.. Tracy Mann's primary complain today is No chief complaint on file. Last encounter: My last encounter with her was on 06/22/2021. Pertinent problems: Tracy Mann has Cervical muscle strain; DDD (degenerative disc disease), lumbosacral; Chronic venous insufficiency; Chronic pain syndrome; Abnormal MRI, lumbar spine (08/22/2019); Lumbar facet hypertrophy (Multilevel) (Bilateral); Lumbar central spinal stenosis (SEVERE) (L3-4) with neurogenic claudication; Grade 1 Anterolisthesis of lumbar spine (L4/L5); Lumbar facet arthropathy (L3-4 right-sided facet edema/joint effusion); Lumbar lateral recess stenosis (L4-5) (Right); Chronic low back pain (3ry area of Pain) (Bilateral) w/ sciatica (Bilateral); Lumbosacral radiculopathy at L5 (Bilateral); Chronic lower extremity pain (1ry area of Pain) (Bilateral); Lumbosacral facet syndrome; Chronic hip pain (2ry area of Pain) (Bilateral) (R>L); Osteoarthritis involving multiple joints; Osteoarthritis of knee (Right); Osteoarthritis of facet joint of lumbar spine; Osteoarthritis of  lumbar spine; Bilateral lower extremity edema; Cellulitis of right lower leg; Chronic knee pain (Bilateral); Peripheral vascular disease ([place]) (lower extremity) (Bilateral); Cervicalgia; Chronic upper back pain; and Lumbosacral spondylosis with radiculopathy on their pertinent problem list. Pain Assessment: Severity of   is reported as a  /10. Location:    / . Onset:  . Quality:  . Timing:  . Modifying factor(s):  Marland Kitchen Vitals:  vitals were not taken for this visit.   Reason for encounter:  *** . ***  Pharmacotherapy Assessment  Analgesic: Hydrocodone/APAP 10/325, (see the 05/09/2021 note) using 1.675 pills/day. MME/day: 10-20 mg/day   Monitoring: [place] PMP: PDMP reviewed during this encounter.       Pharmacotherapy: No side-effects or adverse reactions reported. Compliance: No problems identified. Effectiveness: Clinically acceptable.  No notes on file  UDS:  Summary  Date Value Ref Range Status  05/09/2021 Note  Final    Comment:    ==================================================================== [employer] [facility] (MW) ==================================================================== Test                             Result       Flag       Units  Drug Present and Declared for Prescription Verification   Lorazepam                      1824         EXPECTED   ng/mg creat    Source of lorazepam is a scheduled prescription medication.    Hydrocodone                    2020         EXPECTED   ng/mg creat   Hydromorphone  234          EXPECTED   ng/mg creat   Dihydrocodeine                 495          EXPECTED   ng/mg creat   Norhydrocodone                 2173         EXPECTED   ng/mg creat    Sources of hydrocodone include scheduled prescription medications.    Hydromorphone, dihydrocodeine and norhydrocodone are expected    metabolites of hydrocodone. Hydromorphone and dihydrocodeine are    also available as scheduled prescription  medications.  ==================================================================== Test                      Result    Flag   Units      Ref Range   Creatinine              80               mg/dL      >=20 ==================================================================== Declared Medications:  The flagging and interpretation on this report are based on the  following declared medications.  Unexpected results may arise from  inaccuracies in the declared medications.   **Note: The testing scope of this panel includes these medications:   Hydrocodone (Norco)  Lorazepam (Ativan)   **Note: The testing scope of this panel does not include the  following reported medications:   Acetaminophen (Norco)  Albuterol (Ventolin HFA)  Estradiol (Estrace)  Ezetimibe (Zetia)  Flavoxate (Urispas)  Fluticasone (Flonase)  Insulin (Lantus)  Levothyroxine (Synthroid)  Lisinopril (Zestril)  Magnesium  Metolazone  Metoprolol (Toprol)  Nitroglycerin (Nitrostat)  Potassium (Klor-Con)  Ropinirole (Requip)  Torsemide (Demadex)  Undefined Miscellaneous Drug  Warfarin (Coumadin) ==================================================================== For clinical consultation, please call [phone]. ====================================================================      ROS  Constitutional: Denies any fever or chills Gastrointestinal: No reported hemesis, hematochezia, vomiting, or acute GI distress Musculoskeletal: Denies any acute onset joint swelling, redness, loss of ROM, or weakness Neurological: No reported episodes of acute onset apraxia, aphasia, dysarthria, agnosia, amnesia, paralysis, loss of coordination, or loss of consciousness  Medication Review  Accu-Chek Softclix Lancets, HYDROcodone-acetaminophen, Insulin Pen Needle, LORazepam, Magnesium, NONFORMULARY OR COMPOUNDED ITEM, albuterol, ezetimibe, flavoxATE, fluticasone, glucose blood, insulin glargine, insulin lispro,  levothyroxine, lisinopril, metolazone, nitroGLYCERIN, pantoprazole, potassium chloride SA, rOPINIRole, torsemide, and warfarin  History Review  Allergy: Tracy Mann is allergic to duloxetine, other, paroxetine, sulfa antibiotics, cephalexin, clarithromycin, diphenhydramine, estrogens conjugated, estrogens conjugated, sulfonamide derivatives, and nitrofurantoin. Drug: Ms. Cogar  reports no history of drug use. Alcohol:  reports no history of alcohol use. Tobacco:  reports that she has never smoked. She has never used smokeless tobacco. Social: Ms. Propps  reports that she has never smoked. She has never used smokeless tobacco. She reports that she does not drink alcohol and does not use drugs. Medical:  has a past medical history of Acute myocardial infarction of inferior wall ([place]), Arthritis, Chronic anticoagulation, Chronic atrial fibrillation ([place]), Chronic combined systolic and diastolic CHF (congestive heart failure) ([place]), Essential hypertension, Hyperlipidemia, mixed, Mixed Ischemic/Nonischemic Cardiomyopathy, Nonobstructive coronary artery disease, Obesity, Thyroid disease, Type II diabetes mellitus ([place]), Venous insufficiency, and Vitamin D deficiency. Surgical: Ms. Hollick  has a past surgical history that includes Vesicovaginal fistula closure w/ TAH (1996); Tubal ligation (1968); Cholecystectomy (1999); and Cardiac catheterization (11/2012). Family: family history includes Heart  disease in her mother; Thyroid disease in her father.  Laboratory Chemistry Profile   Renal Lab Results  Component Value Date   BUN 34 (H) 06/29/2021   CREATININE 1.27 (H) 06/29/2021   BCR 31 (H) 06/21/2021   GFRAA 73 12/31/2019   GFRNONAA 41 (L) 06/29/2021    Hepatic Lab Results  Component Value Date   AST 22 06/21/2021   ALT 10 06/21/2021   ALBUMIN 4.0 06/21/2021   ALKPHOS 124 (H) 06/21/2021    Electrolytes Lab Results  Component Value Date   NA 137 06/29/2021   K 4.8 06/29/2021   CL 102 06/29/2021    CALCIUM 9.4 06/29/2021   MG 2.1 11/26/2019   PHOS 3.9 09/08/2014    Bone Lab Results  Component Value Date   25OHVITD1 43 11/26/2019   25OHVITD2 9.2 11/26/2019   25OHVITD3 34 11/26/2019    Inflammation (CRP: Acute Phase) (ESR: Chronic Phase) Lab Results  Component Value Date   CRP 4 11/26/2019   ESRSEDRATE 45 (H) 11/26/2019   LATICACIDVEN 1.4 01/09/2020         Note: Above Lab results reviewed.  Recent Imaging Review  US RENAL CLINICAL DATA:  Renal cyst  EXAM: RENAL / URINARY TRACT ULTRASOUND COMPLETE  COMPARISON:  Renal ultrasound 04/11/2019, CT abdomen pelvis 05/05/2019  FINDINGS: Right Kidney:  Renal measurements: 12.0 x 3.5 x 4.5 cm = volume: 81 mL. Echogenicity within normal limits. No mass or hydronephrosis visualized. There is a tiny shadowing echogenic focus measuring 6 mm in the midpole, most likely a renal calculus. No cyst visualized.  Left Kidney:  Renal measurements: 10.4 x 4.0 x 4.5 cm = volume: 97 mL. Echogenicity within normal limits. No mass or hydronephrosis visualized. There is again a masslike area in the midpole measuring 2.4 x 1.7 x 2.1 cm, previously measuring 3.2 x 2.4 x 2.5 cm. No cyst visualized.  Bladder:  Appears normal for degree of bladder distention.  Other:  None.  IMPRESSION: 1. Interval decrease in size of the masslike area in the left kidney midpole. On follow-up CT in April 2021 there was no discrete mass in this area. If further imaging evaluation is desired, consider renal protocol MRI.  2. Probable nonobstructing calculus in the right kidney measuring 6 mm.  Electronically Signed   By: Audie Pinto M.D.   On: 06/04/2021 09:57 Note: Reviewed        Physical Exam  General appearance: Well nourished, well developed, and well hydrated. In no apparent acute distress Mental status: Alert, oriented x 3 (person, place, & time)       Respiratory: No evidence of acute respiratory distress Eyes: PERLA Vitals:  There were no vitals taken for this visit. BMI: Estimated body mass index is 33.33 kg/m as calculated from the following:   Height as of 06/13/21: 5' 5.5" (1.664 m).   Weight as of 06/29/21: 203 lb 6.4 oz (92.3 kg). Ideal: Ideal body weight: 58.2 kg (128 lb 3.2 oz) Adjusted ideal body weight: 71.8 kg (158 lb 4.5 oz)  Assessment   Diagnosis Status  No diagnosis found. Controlled Controlled Controlled   Updated Problems: No problems updated.  Plan of Care  Problem-specific:  No problem-specific Assessment & Plan notes found for this encounter.  Ms. REMAS SOBEL has a current medication list which includes the following long-term medication(s): albuterol, ezetimibe, fluticasone, [START ON 07/30/2021] hydrocodone-acetaminophen, insulin lispro, levothyroxine, lisinopril, metolazone, nitroglycerin, pantoprazole, potassium chloride sa, torsemide, warfarin, and [DISCONTINUED] ropinirole.  Pharmacotherapy (Medications Ordered): No orders  of the defined types were placed in this encounter.  Orders:  No orders of the defined types were placed in this encounter.  Follow-up plan:   No follow-ups on file.     Interventional Therapies  Risk  Complexity Considerations:   Estimated body mass index is 35.73 kg/m as calculated from the following:   Height as of this encounter: 5' 5.5" (1.664 m).   Weight as of this encounter: 218 lb (98.9 kg). NOTE: COUMADIN Anticoagulation (Stop: 5 days  Restart: 2 hours) Decreased GFR   Planned  Pending:      Under consideration:   Diagnostic midline caudal ESI #1 + diagnostic epidurogram  Diagnostic bilateral IA hip joint injection #1  Diagnostic right L3-4 LESI #1  Diagnostic right L4 TFESI #1  Diagnostic bilateral L3 TFESI #1  Diagnostic bilateral lumbar facet MBB #1    Completed:   Diagnostic/therapeutic midline L4-5 LESI x1 (09/28/2020) (100/100/100/50)    Completed by Dr. Alba Destine [facility]):   Diagnostic bilateral L4 TFESI x1 (09/12/2019 - Dr.  Alba Destine [[facility]])   Therapeutic  Palliative (PRN) options:   None established     Recent Visits Date Type Provider Dept  05/09/21 Office Visit Milinda Pointer, MD [facility]  Showing recent visits within past 90 days and meeting all other requirements Future Appointments Date Type Provider Dept  07/06/21 Appointment Milinda Pointer, [facility]  08/03/21 Appointment Milinda Pointer, MD [facility]  Showing future appointments within next 90 days and meeting all other requirements  I discussed the assessment and treatment plan with the patient. The patient was provided an opportunity to ask questions and all were answered. The patient agreed with the plan and demonstrated an understanding of the instructions.  Patient advised to call back or seek an in-person evaluation if the symptoms or condition worsens.  Duration of encounter: *** minutes.  Note by: Gaspar Cola, MD Date: 07/06/2021; Time: 4:51 PM

## 2021-07-06 ENCOUNTER — Ambulatory Visit: Payer: Medicare Other | Attending: Pain Medicine | Admitting: Pain Medicine

## 2021-07-06 VITALS — BP 133/74 | HR 94 | Temp 97.2°F | Resp 18 | Ht 65.0 in | Wt 195.0 lb

## 2021-07-06 DIAGNOSIS — Z79891 Long term (current) use of opiate analgesic: Secondary | ICD-10-CM | POA: Diagnosis present

## 2021-07-06 DIAGNOSIS — M5442 Lumbago with sciatica, left side: Secondary | ICD-10-CM | POA: Insufficient documentation

## 2021-07-06 DIAGNOSIS — M79604 Pain in right leg: Secondary | ICD-10-CM | POA: Insufficient documentation

## 2021-07-06 DIAGNOSIS — G8929 Other chronic pain: Secondary | ICD-10-CM | POA: Insufficient documentation

## 2021-07-06 DIAGNOSIS — M549 Dorsalgia, unspecified: Secondary | ICD-10-CM | POA: Insufficient documentation

## 2021-07-06 DIAGNOSIS — M25561 Pain in right knee: Secondary | ICD-10-CM | POA: Diagnosis present

## 2021-07-06 DIAGNOSIS — M79605 Pain in left leg: Secondary | ICD-10-CM | POA: Diagnosis present

## 2021-07-06 DIAGNOSIS — G894 Chronic pain syndrome: Secondary | ICD-10-CM | POA: Diagnosis present

## 2021-07-06 DIAGNOSIS — Z79899 Other long term (current) drug therapy: Secondary | ICD-10-CM | POA: Insufficient documentation

## 2021-07-06 DIAGNOSIS — M25551 Pain in right hip: Secondary | ICD-10-CM | POA: Diagnosis present

## 2021-07-06 DIAGNOSIS — M47817 Spondylosis without myelopathy or radiculopathy, lumbosacral region: Secondary | ICD-10-CM | POA: Diagnosis present

## 2021-07-06 DIAGNOSIS — M25562 Pain in left knee: Secondary | ICD-10-CM | POA: Diagnosis present

## 2021-07-06 DIAGNOSIS — M5441 Lumbago with sciatica, right side: Secondary | ICD-10-CM | POA: Insufficient documentation

## 2021-07-06 DIAGNOSIS — M25552 Pain in left hip: Secondary | ICD-10-CM | POA: Diagnosis present

## 2021-07-06 MED ORDER — HYDROCODONE-ACETAMINOPHEN 10-325 MG PO TABS: 1.0000 | ORAL_TABLET | Freq: Three times a day (TID) | ORAL | 0 refills | Status: DC | PRN

## 2021-07-06 MED ORDER — HYDROCODONE-ACETAMINOPHEN 10-325 MG PO TABS: 1.0000 | ORAL_TABLET | Freq: Two times a day (BID) | ORAL | 0 refills | Status: DC

## 2021-07-06 NOTE — Progress Notes (Signed)
Nursing Pain Medication Assessment:  Safety precautions to be maintained throughout the outpatient stay will include: orient to surroundings, keep bed in low position, maintain call bell within reach at all times, provide assistance with transfer out of bed and ambulation.  Medication Inspection Compliance: Pill count conducted under aseptic conditions, in front of the patient. Neither the pills nor the bottle was removed from the patient's sight at any time. Once count was completed pills were immediately returned to the patient in their original bottle.  Medication: Hydrocodone/APAP Pill/Patch Count:  11 of 105 pills remain Pill/Patch Appearance: Markings consistent with prescribed medication Bottle Appearance: Standard pharmacy container. Clearly labeled. Filled Date: 03 / 20 / 2023 Last Medication intake:  Today

## 2021-07-06 NOTE — Patient Instructions (Addendum)
____________________________________________________________________________________________  Medication Rules  Purpose: To inform patients, and their family members, of our rules and regulations.  Applies to: All patients receiving prescriptions (written or electronic).  Pharmacy of record: Pharmacy where electronic prescriptions will be sent. If written prescriptions are taken to a different pharmacy, please inform the nursing staff. The pharmacy listed in the electronic medical record should be the one where you would like electronic prescriptions to be sent.  Electronic prescriptions: In compliance with the [place] Strengthen Opioid Misuse Prevention (STOP) Act of 2017 (Session Law 2017-74/H243), effective January 30, 2018, all controlled substances must be electronically prescribed. Calling prescriptions to the pharmacy will cease to exist.  Prescription refills: Only during scheduled appointments. Applies to all prescriptions.  NOTE: The following applies primarily to controlled substances (Opioid* Pain Medications).   Type of encounter (visit): For patients receiving controlled substances, face-to-face visits are required. (Not an option or up to the patient.)  Patient's responsibilities: Pain Pills: Bring all pain pills to every appointment (except for procedure appointments). Pill Bottles: Bring pills in original pharmacy bottle. Always bring the newest bottle. Bring bottle, even if empty. Medication refills: You are responsible for knowing and keeping track of what medications you take and those you need refilled. The day before your appointment: write a list of all prescriptions that need to be refilled. The day of the appointment: give the list to the admitting nurse. Prescriptions will be written only during appointments. No prescriptions will be written on procedure days. If you forget a medication: it will not be "Called in", "Faxed", or "electronically sent". You will  need to get another appointment to get these prescribed. No early refills. Do not call asking to have your prescription filled early. Prescription Accuracy: You are responsible for carefully inspecting your prescriptions before leaving our office. Have the discharge nurse carefully go over each prescription with you, before taking them home. Make sure that your name is accurately spelled, that your address is correct. Check the name and dose of your medication to make sure it is accurate. Check the number of pills, and the written instructions to make sure they are clear and accurate. Make sure that you are given enough medication to last until your next medication refill appointment. Taking Medication: Take medication as prescribed. When it comes to controlled substances, taking less pills or less frequently than prescribed is permitted and encouraged. Never take more pills than instructed. Never take medication more frequently than prescribed.  Inform other Doctors: Always inform, all of your healthcare providers, of all the medications you take. Pain Medication from other Providers: You are not allowed to accept any additional pain medication from any other Doctor or Healthcare provider. There are two exceptions to this rule. (see below) In the event that you require additional pain medication, you are responsible for notifying us, as stated below. Cough Medicine: Often these contain an opioid, such as codeine or hydrocodone. Never accept or take cough medicine containing these opioids if you are already taking an opioid* medication. The combination may cause respiratory failure and death. Medication Agreement: You are responsible for carefully reading and following our Medication Agreement. This must be signed before receiving any prescriptions from our practice. Safely store a copy of your signed Agreement. Violations to the Agreement will result in no further prescriptions. (Additional copies of our  Medication Agreement are available upon request.) Laws, Rules, & Regulations: All patients are expected to follow all Federal and State Laws, Statutes, Rules, & Regulations. Ignorance of   the Laws does not constitute a valid excuse.  Illegal drugs and Controlled Substances: The use of illegal substances (including, but not limited to marijuana and its derivatives) and/or the illegal use of any controlled substances is strictly prohibited. Violation of this rule may result in the immediate and permanent discontinuation of any and all prescriptions being written by our practice. The use of any illegal substances is prohibited. Adopted CDC guidelines & recommendations: Target dosing levels will be at or below 60 MME/day. Use of benzodiazepines** is not recommended.  Exceptions: There are only two exceptions to the rule of not receiving pain medications from other Healthcare Providers. Exception #1 (Emergencies): In the event of an emergency (i.e.: accident requiring emergency care), you are allowed to receive additional pain medication. However, you are responsible for: As soon as you are able, call our office [phone], at any time of the day or night, and leave a message stating your name, the date and nature of the emergency, and the name and dose of the medication prescribed. In the event that your call is answered by a member of our staff, make sure to document and save the date, time, and the name of the person that took your information.  Exception #2 (Planned Surgery): In the event that you are scheduled by another doctor or dentist to have any type of surgery or procedure, you are allowed (for a period no longer than 30 days), to receive additional pain medication, for the acute post-op pain. However, in this case, you are responsible for picking up a copy of our "Post-op Pain Management for Surgeons" handout, and giving it to your surgeon or dentist. This document is available at our office, and  does not require an appointment to obtain it. Simply go to our office during business hours (Monday-Thursday from 8:00 AM to 4:00 PM) (Friday 8:00 AM to 12:00 Noon) or if you have a scheduled appointment with us, prior to your surgery, and ask for it by name. In addition, you are responsible for: calling our office [phone], at any time of the day or night, and leaving a message stating your name, name of your surgeon, type of surgery, and date of procedure or surgery. Failure to comply with your responsibilities may result in termination of therapy involving the controlled substances. Medication Agreement Violation. Following the above rules, including your responsibilities will help you in avoiding a Medication Agreement Violation ("Breaking your Pain Medication Contract").  *Opioid medications include: morphine, codeine, oxycodone, oxymorphone, hydrocodone, hydromorphone, meperidine, tramadol, tapentadol, buprenorphine, fentanyl, methadone. **Benzodiazepine medications include: diazepam (Valium), alprazolam (Xanax), clonazepam (Klonopine), lorazepam (Ativan), clorazepate (Tranxene), chlordiazepoxide (Librium), estazolam (Prosom), oxazepam (Serax), temazepam (Restoril), triazolam (Halcion) (Last updated: 10/27/2020) ____________________________________________________________________________________________  ____________________________________________________________________________________________  Medication Recommendations and Reminders  Applies to: All patients receiving prescriptions (written and/or electronic).  Medication Rules & Regulations: These rules and regulations exist for your safety and that of others. They are not flexible and neither are we. Dismissing or ignoring them will be considered "non-compliance" with medication therapy, resulting in complete and irreversible termination of such therapy. (See document titled "Medication Rules" for more details.) In all conscience,  because of safety reasons, we cannot continue providing a therapy where the patient does not follow instructions.  Pharmacy of record:  Definition: This is the pharmacy where your electronic prescriptions will be sent.  We do not endorse any particular pharmacy, however, we have experienced problems with [facility] not securing enough medication supply for the community. We do not restrict you   in your choice of pharmacy. However, once we write for your prescriptions, we will NOT be re-sending more prescriptions to fix restricted supply problems created by your pharmacy, or your insurance.  The pharmacy listed in the electronic medical record should be the one where you want electronic prescriptions to be sent. If you choose to change pharmacy, simply notify our nursing staff.  Recommendations: Keep all of your pain medications in a safe place, under lock and key, even if you live alone. We will NOT replace lost, stolen, or damaged medication. After you fill your prescription, take 1 week's worth of pills and put them away in a safe place. You should keep a separate, properly labeled bottle for this purpose. The remainder should be kept in the original bottle. Use this as your primary supply, until it runs out. Once it's gone, then you know that you have 1 week's worth of medicine, and it is time to come in for a prescription refill. If you do this correctly, it is unlikely that you will ever run out of medicine. To make sure that the above recommendation works, it is very important that you make sure your medication refill appointments are scheduled at least 1 week before you run out of medicine. To do this in an effective manner, make sure that you do not leave the office without scheduling your next medication management appointment. Always ask the nursing staff to show you in your prescription , when your medication will be running out. Then arrange for the receptionist to get you a return appointment,  at least 7 days before you run out of medicine. Do not wait until you have 1 or 2 pills left, to come in. This is very poor planning and does not take into consideration that we may need to cancel appointments due to bad weather, sickness, or emergencies affecting our staff. DO NOT ACCEPT A "Partial Fill": If for any reason your pharmacy does not have enough pills/tablets to completely fill or refill your prescription, do not allow for a "partial fill". The law allows the pharmacy to complete that prescription within 72 hours, without requiring a new prescription. If they do not fill the rest of your prescription within those 72 hours, you will need a separate prescription to fill the remaining amount, which we will NOT provide. If the reason for the partial fill is your insurance, you will need to talk to the pharmacist about payment alternatives for the remaining tablets, but again, DO NOT ACCEPT A PARTIAL FILL, unless you can trust your pharmacist to obtain the remainder of the pills within 72 hours.  Prescription refills and/or changes in medication(s):  Prescription refills, and/or changes in dose or medication, will be conducted only during scheduled medication management appointments. (Applies to both, written and electronic prescriptions.) No refills on procedure days. No medication will be changed or started on procedure days. No changes, adjustments, and/or refills will be conducted on a procedure day. Doing so will interfere with the diagnostic portion of the procedure. No phone refills. No medications will be "called into the pharmacy". No Fax refills. No weekend refills. No Holliday refills. No after hours refills.  Remember:  Business hours are:  Monday to Thursday 8:00 AM to 4:00 PM Provider's Schedule: Francisco Naveira, MD - Appointments are:  Medication management: Monday and Wednesday 8:00 AM to 4:00 PM Procedure day: Tuesday and Thursday 7:30 AM to 4:00 PM Bilal Lateef, MD -  Appointments are:  Medication management: Tuesday and Thursday 8:00   AM to 4:00 PM Procedure day: Monday and Wednesday 7:30 AM to 4:00 PM (Last update: 08/20/2019) ____________________________________________________________________________________________  ____________________________________________________________________________________________  CBD (cannabidiol) & Delta-8 (Delta-8 tetrahydrocannabinol) WARNING  Intro: Cannabidiol (CBD) and tetrahydrocannabinol (THC), are two natural compounds found in plants of the Cannabis genus. They can both be extracted from hemp or cannabis. Hemp and cannabis come from the Cannabis sativa plant. Both compounds interact with your body's endocannabinoid system, but they have very different effects. CBD does not produce the high sensation associated with cannabis. Delta-8 tetrahydrocannabinol, also known as delta-8 THC, is a psychoactive substance found in the Cannabis sativa plant, of which marijuana and hemp are two varieties. THC is responsible for the high associated with the illicit use of marijuana.  Applicable to: All individuals currently taking or considering taking CBD (cannabidiol) and, more important, all patients taking opioid analgesic controlled substances (pain medication). (Example: oxycodone; oxymorphone; hydrocodone; hydromorphone; morphine; methadone; tramadol; tapentadol; fentanyl; buprenorphine; butorphanol; dextromethorphan; meperidine; codeine; etc.)  Legal status: CBD remains a Schedule I drug prohibited for any use. CBD is illegal with one exception. In the [place], CBD has a limited Food and Drug Administration (FDA) approval for the treatment of two specific types of epilepsy disorders. Only one CBD product has been approved by the FDA for this purpose: "Epidiolex". FDA is aware that some companies are marketing products containing cannabis and cannabis-derived compounds in ways that violate the Federal Food, Drug and Cosmetic Act  (FD&C Act) and that may put the health and safety of consumers at risk. The FDA, a Federal agency, has not enforced the CBD status since 2018. UPDATE: (03/18/2021) The Drug Enforcement Agency (DEA) issued a letter stating that "delta" cannabinoids, including Delta-8-THCO and Delta-9-THCO, synthetically derived from hemp do not qualify as hemp and will be viewed as Schedule I drugs. (Schedule I drugs, substances, or chemicals are defined as drugs with no currently accepted medical use and a high potential for abuse. Some examples of Schedule I drugs are: heroin, lysergic acid diethylamide (LSD), marijuana (cannabis), 3,4-methylenedioxymethamphetamine (ecstasy), methaqualone, and peyote.) (https://www.dea.gov)  Legality: Some manufacturers ship CBD products nationally, which is illegal. Often such products are sold online and are therefore available throughout the country. CBD is openly sold in head shops and health food stores in some states where such sales have not been explicitly legalized. Selling unapproved products with unsubstantiated therapeutic claims is not only a violation of the law, but also can put patients at risk, as these products have not been proven to be safe or effective. Federal illegality makes it difficult to conduct research on CBD.  Reference: "FDA Regulation of Cannabis and Cannabis-Derived Products, Including Cannabidiol (CBD)" - https://www.fda.gov/news-events/public-health-focus/fda-regulation-cannabis-and-cannabis-derived-products-including-cannabidiol-cbd  Warning: CBD is not FDA approved and has not undergo the same manufacturing controls as prescription drugs.  This means that the purity and safety of available CBD may be questionable. Most of the time, despite manufacturer's claims, it is contaminated with THC (delta-9-tetrahydrocannabinol - the chemical in marijuana responsible for the "HIGH").  When this is the case, the THC contaminant will trigger a positive urine drug  screen (UDS) test for Marijuana (carboxy-THC). Because a positive UDS for any illicit substance is a violation of our medication agreement, your opioid analgesics (pain medicine) may be permanently discontinued. The FDA recently put out a warning about 5 things that everyone should be aware of regarding Delta-8 THC: Delta-8 THC products have not been evaluated or approved by the FDA for safe use and may be marketed in ways that put the   public health at risk. The FDA has received adverse event reports involving delta-8 THC-containing products. Delta-8 THC has psychoactive and intoxicating effects. Delta-8 THC manufacturing often involve use of potentially harmful chemicals to create the concentrations of delta-8 THC claimed in the marketplace. The final delta-8 THC product may have potentially harmful by-products (contaminants) due to the chemicals used in the process. Manufacturing of delta-8 THC products may occur in uncontrolled or unsanitary settings, which may lead to the presence of unsafe contaminants or other potentially harmful substances. Delta-8 THC products should be kept out of the reach of children and pets.  MORE ABOUT CBD  General Information: CBD was discovered in 1940 and it is a derivative of the cannabis sativa genus plants (Marijuana and Hemp). It is one of the 113 identified substances found in Marijuana. It accounts for up to 40% of the plant's extract. As of 2018, preliminary clinical studies on CBD included research for the treatment of anxiety, movement disorders, and pain. CBD is available and consumed in multiple forms, including inhalation of smoke or vapor, as an aerosol spray, and by mouth. It may be supplied as an oil containing CBD, capsules, dried cannabis, or as a liquid solution. CBD is thought not to be as psychoactive as THC (delta-9-tetrahydrocannabinol - the chemical in marijuana responsible for the "HIGH"). Studies suggest that CBD may interact with different  biological target receptors in the body, including cannabinoid and other neurotransmitter receptors. As of 2018 the mechanism of action for its biological effects has not been determined.  Side-effects  Adverse reactions: Dry mouth, diarrhea, decreased appetite, fatigue, drowsiness, malaise, weakness, sleep disturbances, and others.  Drug interactions: CBC may interact with other medications such as blood-thinners. Because CBD causes drowsiness on its own, it also increases the drowsiness caused by other medications, including antihistamines (such as Benadryl), benzodiazepines (Xanax, Ativan, Valium), antipsychotics, antidepressants and opioids, as well as alcohol and supplements such as kava, melatonin and St. John's Wort. Be cautious with the following combinations:   Brivaracetam (Briviact) Brivaracetam is changed and broken down by the body. CBD might decrease how quickly the body breaks down brivaracetam. This might increase levels of brivaracetam in the body.  Caffeine Caffeine is changed and broken down by the body. CBD might decrease how quickly the body breaks down caffeine. This might increase levels of caffeine in the body.  Carbamazepine (Tegretol) Carbamazepine is changed and broken down by the body. CBD might decrease how quickly the body breaks down carbamazepine. This might increase levels of carbamazepine in the body and increase its side effects.  Citalopram (Celexa) Citalopram is changed and broken down by the body. CBD might decrease how quickly the body breaks down citalopram. This might increase levels of citalopram in the body and increase its side effects.  Clobazam (Onfi) Clobazam is changed and broken down by the liver. CBD might decrease how quickly the liver breaks down clobazam. This might increase the effects and side effects of clobazam.  Eslicarbazepine (Aptiom) Eslicarbazepine is changed and broken down by the body. CBD might decrease how quickly the body  breaks down eslicarbazepine. This might increase levels of eslicarbazepine in the body by a small amount.  Everolimus (Zostress) Everolimus is changed and broken down by the body. CBD might decrease how quickly the body breaks down everolimus. This might increase levels of everolimus in the body.  Lithium Taking higher doses of CBD might increase levels of lithium. This can increase the risk of lithium toxicity.  Medications changed by the liver (  Cytochrome P450 1A1 (CYP1A1) substrates) Some medications are changed and broken down by the liver. CBD might change how quickly the liver breaks down these medications. This could change the effects and side effects of these medications.  Medications changed by the liver (Cytochrome P450 1A2 (CYP1A2) substrates) Some medications are changed and broken down by the liver. CBD might change how quickly the liver breaks down these medications. This could change the effects and side effects of these medications.  Medications changed by the liver (Cytochrome P450 1B1 (CYP1B1) substrates) Some medications are changed and broken down by the liver. CBD might change how quickly the liver breaks down these medications. This could change the effects and side effects of these medications.  Medications changed by the liver (Cytochrome P450 2A6 (CYP2A6) substrates) Some medications are changed and broken down by the liver. CBD might change how quickly the liver breaks down these medications. This could change the effects and side effects of these medications.  Medications changed by the liver (Cytochrome P450 2B6 (CYP2B6) substrates) Some medications are changed and broken down by the liver. CBD might change how quickly the liver breaks down these medications. This could change the effects and side effects of these medications.  Medications changed by the liver (Cytochrome P450 2C19 (CYP2C19) substrates) Some medications are changed and broken down by the liver.  CBD might change how quickly the liver breaks down these medications. This could change the effects and side effects of these medications.  Medications changed by the liver (Cytochrome P450 2C8 (CYP2C8) substrates) Some medications are changed and broken down by the liver. CBD might change how quickly the liver breaks down these medications. This could change the effects and side effects of these medications.  Medications changed by the liver (Cytochrome P450 2C9 (CYP2C9) substrates) Some medications are changed and broken down by the liver. CBD might change how quickly the liver breaks down these medications. This could change the effects and side effects of these medications.  Medications changed by the liver (Cytochrome P450 2D6 (CYP2D6) substrates) Some medications are changed and broken down by the liver. CBD might change how quickly the liver breaks down these medications. This could change the effects and side effects of these medications.  Medications changed by the liver (Cytochrome P450 2E1 (CYP2E1) substrates) Some medications are changed and broken down by the liver. CBD might change how quickly the liver breaks down these medications. This could change the effects and side effects of these medications.  Medications changed by the liver (Cytochrome P450 3A4 (CYP3A4) substrates) Some medications are changed and broken down by the liver. CBD might change how quickly the liver breaks down these medications. This could change the effects and side effects of these medications.  Medications changed by the liver (Glucuronidated drugs) Some medications are changed and broken down by the liver. CBD might change how quickly the liver breaks down these medications. This could change the effects and side effects of these medications.  Medications that decrease the breakdown of other medications by the liver (Cytochrome P450 2C19 (CYP2C19) inhibitors) CBD is changed and broken down by the liver.  Some drugs decrease how quickly the liver changes and breaks down CBD. This could change the effects and side effects of CBD.  Medications that decrease the breakdown of other medications in the liver (Cytochrome P450 3A4 (CYP3A4) inhibitors) CBD is changed and broken down by the liver. Some drugs decrease how quickly the liver changes and breaks down CBD. This could change the effects   and side effects of CBD.  Medications that increase breakdown of other medications by the liver (Cytochrome P450 3A4 (CYP3A4) inducers) CBD is changed and broken down by the liver. Some drugs increase how quickly the liver changes and breaks down CBD. This could change the effects and side effects of CBD.  Medications that increase the breakdown of other medications by the liver (Cytochrome P450 2C19 (CYP2C19) inducers) CBD is changed and broken down by the liver. Some drugs increase how quickly the liver changes and breaks down CBD. This could change the effects and side effects of CBD.  Methadone (Dolophine) Methadone is broken down by the liver. CBD might decrease how quickly the liver breaks down methadone. Taking cannabidiol along with methadone might increase the effects and side effects of methadone.  Rufinamide (Banzel) Rufinamide is changed and broken down by the body. CBD might decrease how quickly the body breaks down rufinamide. This might increase levels of rufinamide in the body by a small amount.  Sedative medications (CNS depressants) CBD might cause sleepiness and slowed breathing. Some medications, called sedatives, can also cause sleepiness and slowed breathing. Taking CBD with sedative medications might cause breathing problems and/or too much sleepiness.  Sirolimus (Rapamune) Sirolimus is changed and broken down by the body. CBD might decrease how quickly the body breaks down sirolimus. This might increase levels of sirolimus in the body.  Stiripentol (Diacomit) Stiripentol is changed and  broken down by the body. CBD might decrease how quickly the body breaks down stiripentol. This might increase levels of stiripentol in the body and increase its side effects.  Tacrolimus (Prograf) Tacrolimus is changed and broken down by the body. CBD might decrease how quickly the body breaks down tacrolimus. This might increase levels of tacrolimus in the body.  Tamoxifen (Soltamox) Tamoxifen is changed and broken down by the body. CBD might affect how quickly the body breaks down tamoxifen. This might affect levels of tamoxifen in the body.  Topiramate (Topamax) Topiramate is changed and broken down by the body. CBD might decrease how quickly the body breaks down topiramate. This might increase levels of topiramate in the body by a small amount.  Valproate Valproic acid can cause liver injury. Taking cannabidiol with valproic acid might increase the chance of liver injury. CBD and/or valproic acid might need to be stopped, or the dose might need to be reduced.  Warfarin (Coumadin) CBD might increase levels of warfarin, which can increase the risk for bleeding. CBD and/or warfarin might need to be stopped, or the dose might need to be reduced.  Zonisamide Zonisamide is changed and broken down by the body. CBD might decrease how quickly the body breaks down zonisamide. This might increase levels of zonisamide in the body by a small amount. (Last update: 03/30/2021) ____________________________________________________________________________________________  ____________________________________________________________________________________________  Drug Holidays (Slow)  What is a "Drug Holiday"? Drug Holiday: is the name given to the period of time during which a patient stops taking a medication(s) for the purpose of eliminating tolerance to the drug.  Benefits Improved effectiveness of opioids. Decreased opioid dose needed to achieve benefits. Improved pain with lesser  dose.  What is tolerance? Tolerance: is the progressive decreased in effectiveness of a drug due to its repetitive use. With repetitive use, the body gets use to the medication and as a consequence, it loses its effectiveness. This is a common problem seen with opioid pain medications. As a result, a larger dose of the drug is needed to achieve the same effect that   used to be obtained with a smaller dose.  How long should a "Drug Holiday" last? You should stay off of the pain medicine for at least 14 consecutive days. (2 weeks)  Should I stop the medicine "cold [place]"? No. You should always coordinate with your Pain Specialist so that he/she can provide you with the correct medication dose to make the transition as smoothly as possible.  How do I stop the medicine? Slowly. You will be instructed to decrease the daily amount of pills that you take by one (1) pill every seven (7) days. This is called a "slow downward taper" of your dose. For example: if you normally take four (4) pills per day, you will be asked to drop this dose to three (3) pills per day for seven (7) days, then to two (2) pills per day for seven (7) days, then to one (1) per day for seven (7) days, and at the end of those last seven (7) days, this is when the "Drug Holiday" would start.   Will I have withdrawals? By doing a "slow downward taper" like this one, it is unlikely that you will experience any significant withdrawal symptoms. Typically, what triggers withdrawals is the sudden stop of a high dose opioid therapy. Withdrawals can usually be avoided by slowly decreasing the dose over a prolonged period of time. If you do not follow these instructions and decide to stop your medication abruptly, withdrawals may be possible.  What are withdrawals? Withdrawals: refers to the wide range of symptoms that occur after stopping or dramatically reducing opiate drugs after heavy and prolonged use. Withdrawal symptoms do not occur to  patients that use low dose opioids, or those who take the medication sporadically. Contrary to benzodiazepine (example: Valium, Xanax, etc.) or alcohol withdrawals ("Delirium Tremens"), opioid withdrawals are not lethal. Withdrawals are the physical manifestation of the body getting rid of the excess receptors.  Expected Symptoms Early symptoms of withdrawal may include: Agitation Anxiety Muscle aches Increased tearing Insomnia Runny nose Sweating Yawning  Late symptoms of withdrawal may include: Abdominal cramping Diarrhea Dilated pupils Goose bumps Nausea Vomiting  Will I experience withdrawals? Due to the slow nature of the taper, it is very unlikely that you will experience any.  What is a slow taper? Taper: refers to the gradual decrease in dose.  (Last update: 08/20/2019) ____________________________________________________________________________________________   Dennis Bast may fill your prescriptions for Hydrocodone on  07-13-21 07-30-21 08-29-21 09-28-21

## 2021-07-11 ENCOUNTER — Telehealth: Payer: Self-pay | Admitting: Pain Medicine

## 2021-07-11 NOTE — Telephone Encounter (Signed)
Daughter Ricard Dillon. Would like to speak with nurse about current medication regimen, states patient was in excruciating pain on Friday night, would not take another pill because she was afraid of running out of medications. The family are concerned about her pain .

## 2021-07-12 NOTE — Telephone Encounter (Signed)
Daughter concerned about Ms. Hocker's uncontrolled pain. I explained to her everything that Dr. Dossie Arbour discussed with pt at last appt. She agrees for pt to have a VV with her present so that her concerns may be addressed.

## 2021-07-13 ENCOUNTER — Ambulatory Visit: Payer: Medicare Other | Attending: Pain Medicine | Admitting: Pain Medicine

## 2021-07-13 ENCOUNTER — Ambulatory Visit: Payer: Medicare Other

## 2021-07-13 DIAGNOSIS — M5417 Radiculopathy, lumbosacral region: Secondary | ICD-10-CM

## 2021-07-13 DIAGNOSIS — M79604 Pain in right leg: Secondary | ICD-10-CM | POA: Diagnosis not present

## 2021-07-13 DIAGNOSIS — M79605 Pain in left leg: Secondary | ICD-10-CM

## 2021-07-13 DIAGNOSIS — G894 Chronic pain syndrome: Secondary | ICD-10-CM

## 2021-07-13 DIAGNOSIS — Z7901 Long term (current) use of anticoagulants: Secondary | ICD-10-CM

## 2021-07-13 DIAGNOSIS — M48061 Spinal stenosis, lumbar region without neurogenic claudication: Secondary | ICD-10-CM | POA: Diagnosis not present

## 2021-07-13 DIAGNOSIS — F411 Generalized anxiety disorder: Secondary | ICD-10-CM

## 2021-07-13 DIAGNOSIS — M47816 Spondylosis without myelopathy or radiculopathy, lumbar region: Secondary | ICD-10-CM

## 2021-07-13 DIAGNOSIS — M5137 Other intervertebral disc degeneration, lumbosacral region: Secondary | ICD-10-CM

## 2021-07-13 DIAGNOSIS — M4316 Spondylolisthesis, lumbar region: Secondary | ICD-10-CM | POA: Diagnosis not present

## 2021-07-13 DIAGNOSIS — G8929 Other chronic pain: Secondary | ICD-10-CM

## 2021-07-13 DIAGNOSIS — M48062 Spinal stenosis, lumbar region with neurogenic claudication: Secondary | ICD-10-CM

## 2021-07-13 DIAGNOSIS — Z79891 Long term (current) use of opiate analgesic: Secondary | ICD-10-CM

## 2021-07-13 DIAGNOSIS — E669 Obesity, unspecified: Secondary | ICD-10-CM

## 2021-07-13 DIAGNOSIS — G4733 Obstructive sleep apnea (adult) (pediatric): Secondary | ICD-10-CM

## 2021-07-13 DIAGNOSIS — Z79899 Other long term (current) drug therapy: Secondary | ICD-10-CM

## 2021-07-13 NOTE — Progress Notes (Signed)
Patient: Tracy Mann  Service Category: E/M  Provider: Gaspar Cola, MD  DOB: [birth year]  DOS: 07/13/2021  Location: Office  MRN: [id number]  Setting: Ambulatory outpatient  Referring Provider: Jerrol Banana.,*  Type: Established Patient  Specialty: Interventional Pain Management  PCP: Jerrol Banana., MD  Location: Remote location  Delivery: TeleHealth     Virtual Encounter - Pain Management PROVIDER NOTE: Information contained herein reflects review and annotations entered in association with encounter. Interpretation of such information and data should be left to medically-trained personnel. Information provided to patient can be located elsewhere in the medical record under "Patient Instructions". Document created using STT-dictation technology, any transcriptional errors that may result from process are unintentional.    Contact & Pharmacy Preferred: [phone] Home: [phone] (home) Mobile: [phone] (mobile) E-mail: [email or web address]  [facility] (MAIL ORDER) [place], [place] [address] Phone: [phone] Fax: [phone]  [place] [address]), [place] - [place] ([place]) [place] [address] Phone: [phone] Fax: [place], [place] [address] [facility] [place] [address] Phone: [phone] Fax: [phone]   Pre-screening  Tracy Mann offered "in-person" vs "virtual" encounter. She indicated preferring virtual for this encounter.   Reason COVID-19*  Social distancing based on CDC and AMA recommendations.   I contacted Tracy Mann on 07/13/2021 via telephone.      I clearly identified myself as Gaspar Cola, MD. I verified that I was speaking with the correct person using two identifiers (Name: Tracy Mann, and date of birth:  [birth year]).  Consent I sought verbal advanced consent from Tracy Mann for virtual visit interactions. I informed Tracy Mann of possible security and privacy concerns, risks, and limitations associated with providing "not-in-person" medical evaluation and management services. I also informed Tracy Mann of the availability of "in-person" appointments. Finally, I informed her that there would be a charge for the virtual visit and that she could be  personally, fully or partially, financially responsible for it. [name] expressed understanding and agreed to proceed.   Historic Elements   Tracy Mann is a 88 y.o. year old, female patient evaluated today after our last contact on 07/11/2021. Tracy Mann  has a past medical history of Acute myocardial infarction of inferior wall ([place]), Arthritis, Chronic anticoagulation, Chronic atrial fibrillation ([place]), Chronic combined systolic and diastolic CHF (congestive heart failure) ([place]), Essential hypertension, Hyperlipidemia, mixed, Mixed Ischemic/Nonischemic Cardiomyopathy, Nonobstructive coronary artery disease, Obesity, Thyroid disease, Type II diabetes mellitus ([place]), Venous insufficiency, and Vitamin D deficiency. She also  has a past surgical history that includes Vesicovaginal fistula closure w/ TAH (1996); Tubal ligation (1968); Cholecystectomy (1999); and Cardiac catheterization (11/2012). Tracy Mann has a current medication list which includes the following prescription(s): accu-chek aviva plus, accu-chek softclix lancets, albuterol, ezetimibe, flavoxate, fluticasone, [START ON 07/30/2021] hydrocodone-acetaminophen, [START ON 08/29/2021] hydrocodone-acetaminophen, [START ON 09/28/2021] hydrocodone-acetaminophen, hydrocodone-acetaminophen, lantus solostar, insulin lispro, relion pen needle 31g/83m, levothyroxine, lisinopril, lorazepam, magnesium, metolazone, nitroglycerin, NONFORMULARY OR COMPOUNDED ITEM, pantoprazole, potassium chloride sa, torsemide, warfarin,  and [DISCONTINUED] ropinirole. She  reports that she has never smoked. She has never used smokeless tobacco. She reports that she does not drink alcohol and does not use drugs. Tracy Mann allergic to duloxetine, other, paroxetine, sulfa antibiotics, cephalexin, clarithromycin, diphenhydramine, estrogens conjugated, estrogens conjugated, sulfonamide derivatives, and nitrofurantoin.  HPI  Today, she is being contacted for medication management.  Today the patient has requested a three-way call with her daughter who lives in [place] for the purpose of discussing her medications.  The patient was recently seen at the clinic on 07/06/2021 for medication management.  I have been closely following this patient's medication dispensation through the PMP system and I have also been closely monitoring the patient's pill counts and when is it that she goes to pick up her prescriptions.  I have also identified the patient to have significant anxiety and focusing on her medications when in fact she has not been using the amount of pills that she claims to be using and this has been extensively documented on her 07/06/2021 and 05/09/2021 notes.  She claims to be trying to keep her medication use to no more than 3 tablets/day, but calculations in terms of the amount of pills that she has received minus the amount that she brings in for pill count divided by the amount of days that usually will pass between her refills tells me that the patient has been using an average of 1.2 pills/day.  This means that based on the amount of pills that she was receiving in the past, she has probably accumulated a significant amount of pills at home.  The patient has been informed that hoarding controlled substances is not allowed and that I am also not allowed to be providing her with more medication than what she is actually using.  In addition to this, she has also been taking benzodiazepines, increasing her risk of respiratory depression and  death.  On the patient's last visit on 07/06/2021 she was insisting that she needed to have more pills prescribed to her, which I will not allow.  On 09/28/2020 the patient had a midline L4-5 LESI which provided her with 100% relief of her pain for the duration of the local anesthetic followed by an ongoing 100% relief of her left hip pain.  In the area of her thigh and the pain below the knee she describes having an ongoing 50% improvement of her pain.  The patient was offered repeat injections to get this pain down and get it under control, but again she has significant issues with anxiety and although her primary care physician has cleared her to come off of the Coumadin to have these procedures done, she refers not being comfortable with that.  The problem with this is that the patient was informed from the 1 that we do not take patients for medication management only and that we would manage her medications only and during the time that was necessary to provide her with her interventional therapy so as to improve her pain and decrease her need for those opioid analgesics.  Tracy Mann with the issue of a nationwide shortage on opioid analgesics makes it even more important for this patient to begin to consider doing a controlled opioid taper in order to completely come off of the substances.  At this point they only found that concerns that she has with these medications and the fact that her pain can be controlled without them, significantly raises my concerns of the patient having possible signs of addictive behavior.  Furthermore, taking into consideration the patient's age, history of sleep apnea, use of benzodiazepines, and obesity, as well as her chronic generalized anxiety and history of clinical depression, I believe that not only it is not necessary for her opioid analgesics to be increased, but she represents  a high risk to the use of these medicines.  Today, during our 67-minute 3 way conference call  between the patient, her daughter Tracy Mann, and myself, I communicated all of the above information to them and I have explained very clearly that I will not be increasing the patient's opioid doses because of her ongoing chronic kidney disease, sleep apnea, and use of benzodiazepines.  I do understand that the patient has some significant generalized anxiety issues, but these are not treated with pain medication.  I have also reminded them that the way that we will manage her pain is by keeping her opioid use to a level where we can try to minimize the risks of respiratory depression and death.  I have reminded the patient that should she have an increase in the pain, she needs to let me know so that we can address this with interventional therapies so asked to control the pain in such a way that the medications that we are providing her will be effective in controlling that pain on a daily basis.  Today she indicates that she is currently experiencing pain going down both lower extremities to the top of her feet and what appears to be an L5 dermatomal distribution.  Currently she denies any back pain or hip pain.  I attempted to have her give provide me with the laterality of where her pain is worse, but she insisted that he was equally as bad on both legs and therefore we will again go onto doing the L4-5 LESI with a midline approach.  Several times during this encounter I make sure that they understood that what we do is "pain management" and that expecting complete elimination of the pain is unrealistic.  They understood and accepted.  RTCB: 10/28/2021  Pharmacotherapy Assessment   Opioid Analgesic: Hydrocodone/APAP 10/325, (see the 05/09/2021 note) using 1.675 pills/day. MME/day: 10-20 mg/day   Monitoring: [place] PMP: PDMP reviewed during this encounter.       Pharmacotherapy: No side-effects or adverse reactions reported. Compliance: No problems identified. Effectiveness: Clinically  acceptable. Plan: Refer to "POC". UDS:  Summary  Date Value Ref Range Status  05/09/2021 Note  Final    Comment:    ==================================================================== [employer] [facility] (MW) ==================================================================== Test                             Result       Flag       Units  Drug Present and Declared for Prescription Verification   Lorazepam                      1824         EXPECTED   ng/mg creat    Source of lorazepam is a scheduled prescription medication.    Hydrocodone                    2020         EXPECTED   ng/mg creat   Hydromorphone                  234          EXPECTED   ng/mg creat   Dihydrocodeine                 495          EXPECTED   ng/mg creat   Norhydrocodone  2173         EXPECTED   ng/mg creat    Sources of hydrocodone include scheduled prescription medications.    Hydromorphone, dihydrocodeine and norhydrocodone are expected    metabolites of hydrocodone. Hydromorphone and dihydrocodeine are    also available as scheduled prescription medications.  ==================================================================== Test                      Result    Flag   Units      Ref Range   Creatinine              80               mg/dL      >=20 ==================================================================== Declared Medications:  The flagging and interpretation on this report are based on the  following declared medications.  Unexpected results may arise from  inaccuracies in the declared medications.   **Note: The testing scope of this panel includes these medications:   Hydrocodone (Norco)  Lorazepam (Ativan)   **Note: The testing scope of this panel does not include the  following reported medications:   Acetaminophen (Norco)  Albuterol (Ventolin HFA)  Estradiol (Estrace)  Ezetimibe (Zetia)  Flavoxate (Urispas)  Fluticasone (Flonase)  Insulin (Lantus)   Levothyroxine (Synthroid)  Lisinopril (Zestril)  Magnesium  Metolazone  Metoprolol (Toprol)  Nitroglycerin (Nitrostat)  Potassium (Klor-Con)  Ropinirole (Requip)  Torsemide (Demadex)  Undefined Miscellaneous Drug  Warfarin (Coumadin) ==================================================================== For clinical consultation, please call [phone]. ====================================================================      Laboratory Chemistry Profile   Renal Lab Results  Component Value Date   BUN 34 (H) 06/29/2021   CREATININE 1.27 (H) 06/29/2021   BCR 31 (H) 06/21/2021   GFRAA 73 12/31/2019   GFRNONAA 41 (L) 06/29/2021    Hepatic Lab Results  Component Value Date   AST 22 06/21/2021   ALT 10 06/21/2021   ALBUMIN 4.0 06/21/2021   ALKPHOS 124 (H) 06/21/2021    Electrolytes Lab Results  Component Value Date   NA 137 06/29/2021   K 4.8 06/29/2021   CL 102 06/29/2021   CALCIUM 9.4 06/29/2021   MG 2.1 11/26/2019   PHOS 3.9 09/08/2014    Bone Lab Results  Component Value Date   25OHVITD1 43 11/26/2019   25OHVITD2 9.2 11/26/2019   25OHVITD3 34 11/26/2019    Inflammation (CRP: Acute Phase) (ESR: Chronic Phase) Lab Results  Component Value Date   CRP 4 11/26/2019   ESRSEDRATE 45 (H) 11/26/2019   LATICACIDVEN 1.4 01/09/2020         Note: Above Lab results reviewed.  Imaging  LONG TERM MONITOR (3-14 DAYS) Event monitor Patch Wear Time:  13 days and 23 hours (2023-05-21T14:41:01-398 to  2023-06-04T14:27:07-398)  Atrial Fibrillation occurred continuously (100% burden), ranging from  32-164 bpm (avg of 86 bpm).  5 Ventricular Tachycardia runs occurred, the run with the fastest interval  lasting 4 beats with a max rate of 176 bpm, the longest lasting 7 beats  with an avg rate of 153 bpm.   Intermittent Bundle Branch Block was present. Isolated VEs were occasional  (1.6%, 28130), VE Couplets were rare (<1.0%, 193), and VE Triplets were  rare  (<1.0%, 7).   Ventricular Bigeminy and Trigeminy were present. Inverted QRS complexes possibly due to inverted placement of device  Triggered events associated with atrial fibrillation, rare PVC  Signed, Esmond Plants, MD, Ph.D [facility]  Assessment  The primary encounter diagnosis was Chronic lower extremity pain (  1ry area of Pain) (Bilateral). Diagnoses of Lumbosacral radiculopathy at L5 (Bilateral), Grade 1 Anterolisthesis of lumbar spine (L4/L5), Lumbar lateral recess stenosis (L4-5) (Right), Lumbar central spinal stenosis (SEVERE) (L3-4) with neurogenic claudication, DDD (degenerative disc disease), lumbosacral, Lumbar facet arthropathy (L3-4 right-sided facet edema/joint effusion), Chronic pain syndrome, Pharmacologic therapy, Obstructive sleep apnea, Long term current use of opiate analgesic, Long-term current use of benzodiazepine, Obesity, Class I, BMI 30-34.9, Anxiety, generalized, and Chronic anticoagulation (Coumadin) were also pertinent to this visit.  Plan of Care  Problem-specific:  No problem-specific Assessment & Plan notes found for this encounter.  Tracy Mann has a current medication list which includes the following long-term medication(s): albuterol, ezetimibe, fluticasone, [START ON 07/30/2021] hydrocodone-acetaminophen, [START ON 08/29/2021] hydrocodone-acetaminophen, [START ON 09/28/2021] hydrocodone-acetaminophen, hydrocodone-acetaminophen, insulin lispro, levothyroxine, lisinopril, metolazone, nitroglycerin, pantoprazole, potassium chloride sa, torsemide, warfarin, and [DISCONTINUED] ropinirole.  Pharmacotherapy (Medications Ordered): No orders of the defined types were placed in this encounter.  Orders:  Orders Placed This Encounter  Procedures   Lumbar Epidural Injection    Standing Status:   Future    Standing Expiration Date:   10/13/2021    Scheduling Instructions:     Procedure: Interlaminar Lumbar Epidural Steroid injection (LESI)  L4-5      Laterality: Midline     Sedation: Patient's choice.     Timeframe: ASAA    Order Specific Question:   Where will this procedure be performed?    Answer:   [facility]   Blood Thinner Instructions to Nursing    Always make sure patient has clearance from prescribing physician to stop blood thinners for interventional therapies. If the patient requires a Lovenox-bridge therapy, make sure arrangements are made to institute it with the assistance of the PCP.    Scheduling Instructions:     Have Tracy Mann stop the Coumadin (Warfarin) X 5 days prior to procedure or surgery.   Follow-up plan:   Return for [facility]) procedure: (ML) L4-5 LESI #2, (Blood Thinner Protocol).     Interventional Therapies  Risk  Complexity Considerations:   Estimated body mass index is 35.73 kg/m as calculated from the following:   Height as of this encounter: 5' 5.5" (1.664 m).   Weight as of this encounter: 218 lb (98.9 kg). NOTE: COUMADIN Anticoagulation (Stop: 5 days  Restart: 2 hours) Decreased GFR Obstructive sleep apnea   Planned  Pending:   Diagnostic/therapeutic midline L4-5 LESI #2    Under consideration:   Diagnostic midline caudal ESI #1 + diagnostic epidurogram  Diagnostic bilateral IA hip joint injection #1  Diagnostic right L3-4 LESI #1  Diagnostic right L4 TFESI #1  Diagnostic bilateral L3 TFESI #1  Diagnostic bilateral lumbar facet MBB #1    Completed:   Diagnostic/therapeutic midline L4-5 LESI x1 (09/28/2020) (100/100/100/50)    Completed by Dr. Alba Destine [facility]):   Diagnostic bilateral L4 TFESI x1 (09/12/2019 - Dr. Alba Destine [[facility]])    Therapeutic  Palliative (PRN) options:   None established    Recent Visits Date Type Provider Dept  07/06/21 Office Visit Milinda Pointer, MD [facility]  05/09/21 Office Visit Milinda Pointer, MD [facility]  Showing recent visits within past 90 days and meeting all other requirements Today's Visits Date Type Provider  Dept  07/13/21 Office Visit Milinda Pointer, MD [facility]  Showing today's visits and meeting all other requirements Future Appointments No visits were found meeting these conditions. Showing future appointments within next 90 days and meeting all other requirements  I discussed the assessment and treatment plan with the patient. The patient was provided an opportunity to ask questions and all were answered. The patient agreed with the plan and demonstrated an understanding of the instructions.  Patient advised to call back or seek an in-person evaluation if the symptoms or condition worsens.  Duration of encounter: 67 minutes.  Note by: Gaspar Cola, MD Date: 07/13/2021; Time: 3:34 PM

## 2021-07-19 ENCOUNTER — Telehealth: Payer: Self-pay | Admitting: Emergency Medicine

## 2021-07-19 NOTE — Telephone Encounter (Signed)
-----   Message from Minna Merritts, MD sent at 07/17/2021  8:30 PM EDT ----- Event monitor Monitor showing persistent atrial fibrillation the entire time Triggered events associated with rare PVC

## 2021-07-19 NOTE — Telephone Encounter (Signed)
Called patient to go over results. No answer. Lmtcb. 

## 2021-07-21 ENCOUNTER — Telehealth: Payer: Self-pay | Admitting: Pain Medicine

## 2021-07-21 NOTE — Telephone Encounter (Signed)
Patient stated that she haven't been able to sleep due to increase pain. Pain in both legs, thighs. Pt is taking 2 pills a day it's not helping with the pain. So pt wants to know what else can be done. Please give patient a call.Thanks

## 2021-07-21 NOTE — Telephone Encounter (Signed)
Patient needs an appointment to discuss medication and increased pain.

## 2021-07-22 ENCOUNTER — Telehealth: Payer: Self-pay | Admitting: Family Medicine

## 2021-07-22 NOTE — Telephone Encounter (Signed)
Called patient. No answer. Lmtcb.  

## 2021-07-25 ENCOUNTER — Encounter: Payer: Self-pay | Admitting: Emergency Medicine

## 2021-07-25 NOTE — Progress Notes (Signed)
I,[name],acting as a scribe for Wilhemena Durie, MD.,have documented all relevant documentation on the behalf of Wilhemena Durie, MD,as directed by  Wilhemena Durie, MD while in the presence of Wilhemena Durie, MD.  Established patient visit   Patient: Tracy Mann   DOB: [birth year]   88 y.o. Female  MRN: [id number] Visit Date: 07/26/2021  Today's healthcare provider: Wilhemena Durie, MD   No chief complaint on file.  Subjective    HPI  Patient is here for 6 week follow up on chronic problems. Patient reports she has been having shake in her hands and legs at times and is concerned. States she has not have any more SOB since last visit but does have phlegm that is clear. Has been having a hard time staying asleep due to restless leg and would like . Also reports having a poor appetite lately. She is considering an epidural for her back pain. Dr. Elisabeth Cara follows her from endocrinology for her diabetes.  Last A1c was 7.7. Patient stopped metoprolol on her own. She requests ropinirole refill which is the lowest dose for her restless leg syndrome at night.  She states it really helped. Patient reports she has been taking coumadin as follows: 3 mg qd except Sunday and Thursday 4 mg Should have been taking 3 mg except on Sun, Wed, and Friday 4 mg  Medications: Outpatient Medications Prior to Visit  Medication Sig   ACCU-CHEK AVIVA PLUS test strip USE WITH METER TO CHECK GLUCOSE BEFORE MEALS AND AT BEDTIME   Accu-Chek Softclix Lancets lancets SMARTSIG:Topical   albuterol (VENTOLIN HFA) 108 (90 Base) MCG/ACT inhaler TAKE 2 PUFFS BY MOUTH EVERY 6 HOURS AS NEEDED FOR WHEEZE OR SHORTNESS OF BREATH   ezetimibe (ZETIA) 10 MG tablet TAKE 1 TABLET BY MOUTH EVERY DAY   flavoxATE (URISPAS) 100 MG tablet Take 1 tablet (100 mg total) by mouth 2 (two) times daily as needed for bladder spasms.   fluticasone (FLONASE) 50 MCG/ACT nasal spray Place 2 sprays into both nostrils  daily.   [START ON 07/30/2021] HYDROcodone-acetaminophen (NORCO) 10-325 MG tablet Take 1 tablet by mouth every 8 (eight) hours as needed for severe pain. Must last 30 days.   [START ON 08/29/2021] HYDROcodone-acetaminophen (NORCO) 10-325 MG tablet Take 1 tablet by mouth every 8 (eight) hours as needed for severe pain. Must last 30 days.   [START ON 09/28/2021] HYDROcodone-acetaminophen (NORCO) 10-325 MG tablet Take 1 tablet by mouth every 8 (eight) hours as needed for severe pain. Must last 30 days.   HYDROcodone-acetaminophen (NORCO) 10-325 MG tablet Take 1 tablet by mouth in the morning and at bedtime for 17 days. Must last 17 days.   insulin glargine (LANTUS SOLOSTAR) 100 UNIT/ML Solostar Pen Inject 26 Units into the skin at bedtime.   insulin lispro (HUMALOG) 100 UNIT/ML KwikPen Inject 10-25 Units into the skin in the morning, at noon, and at bedtime.   Insulin Pen Needle (RELION PEN NEEDLE 31G/8MM) 31G X 8 MM MISC Use with insulin pen three times daily   levothyroxine (SYNTHROID) 88 MCG tablet Take 88 mcg by mouth daily.   lisinopril (ZESTRIL) 10 MG tablet Take 1 tablet (10 mg total) by mouth daily.   LORazepam (ATIVAN) 1 MG tablet TAKE 1 TABLET(1 MG) BY MOUTH TWICE DAILY AS NEEDED   Magnesium 500 MG CAPS Take 1,000 mg by mouth daily.    metolazone (ZAROXOLYN) 2.5 MG tablet Take 1 tablet (2.5 mg total) by mouth  as directed. Take two times a week with torsemide 40 mg   nitroGLYCERIN (NITROSTAT) 0.4 MG SL tablet Place 1 tablet (0.4 mg total) under the tongue every 5 (five) minutes as needed for chest pain.   NONFORMULARY OR COMPOUNDED ITEM See pharmacy note- Not to use on any open skin or wound.   pantoprazole (PROTONIX) 40 MG tablet Take 1 tablet (40 mg total) by mouth daily. (Patient taking differently: Take 40 mg by mouth daily as needed.)   potassium chloride SA (KLOR-CON) 20 MEQ tablet Take 1 tablet (20 mEq total) by mouth daily.   torsemide (DEMADEX) 20 MG tablet Take 2 tablets (40 mg total) by  mouth daily. (Patient taking differently: Take 30 mg by mouth daily.)   warfarin (COUMADIN) 4 MG tablet Take daily or as directed by physician   No facility-administered medications prior to visit.    Review of Systems  Constitutional:  Negative for appetite change, chills, fatigue and fever.  Respiratory:  Negative for chest tightness and shortness of breath.   Cardiovascular:  Negative for chest pain and palpitations.  Gastrointestinal:  Negative for abdominal pain, nausea and vomiting.  Neurological:  Negative for dizziness and weakness.    Last hemoglobin A1c Lab Results  Component Value Date   HGBA1C 8.2 (H) 12/31/2019       Objective    There were no vitals taken for this visit. BP Readings from Last 3 Encounters:  07/26/21 110/71  07/06/21 133/74  06/29/21 114/66   Wt Readings from Last 3 Encounters:  07/26/21 196 lb 14.4 oz (89.3 kg)  07/06/21 195 lb (88.5 kg)  06/29/21 203 lb 6.4 oz (92.3 kg)      Physical Exam Vitals reviewed.  Constitutional:      General: She is not in acute distress.    Appearance: Normal appearance. She is not ill-appearing.  HENT:     Head: Normocephalic.  Cardiovascular:     Rate and Rhythm: Normal rate and regular rhythm.     Pulses: Normal pulses.     Heart sounds: Normal heart sounds. No murmur heard.    No friction rub. No gallop.  Pulmonary:     Effort: Pulmonary effort is normal. No respiratory distress.     Breath sounds: Normal breath sounds. No stridor. No wheezing, rhonchi or rales.  Abdominal:     General: Bowel sounds are normal.     Palpations: Abdomen is soft.     Tenderness: There is no abdominal tenderness. There is no right CVA tenderness or left CVA tenderness.  Musculoskeletal:     Right lower leg: No edema.     Left lower leg: No edema.     Comments: 1+ lower extremity edema chronic  Skin:    General: Skin is warm and dry.     Comments: Very fair skin.  Neurological:     Mental Status: She is alert and  oriented to person, place, and time.  Psychiatric:        Mood and Affect: Mood normal.        Behavior: Behavior normal.       No results found for any visits on 07/26/21.  Assessment & Plan     1. Chronic atrial fibrillation (HCC) On Coumadin - POCT INR  2. Mixed Ischemic/Nonischemic Cardiomyopathy Followed by cardiology.  3. Chronic venous insufficiency Patient advised to wear support hose.  She says she cannot get them on.  4. Hypothyroidism, unspecified type    5. Hyperglycemia associated with diabetes (  [place])    6. Chronic low back pain (3ry area of Pain) (Bilateral) w/ sciatica (Bilateral) Considering epidural injection Chronic narcotics now appropriately managed by pain clinic  7. Bladder infection, chronic No UTI present  8. Statin intolerance Refill Zetia Patient claims severe muscle pain with statin  9. History of tachycardia Patient recently stopped her metoprolol on her own.  10. Anxiety, generalized This is an ongoing major issue for this patient with her quality of life. I would like to try sertraline or Lexapro for this patient.  She states the lorazepam helps her a lot.  Future will be cutting her dose in half due to her age and fall risk. 11.  RLS No follow-ups on file.      I, Wilhemena Durie, MD, have reviewed all documentation for this visit. The documentation on 07/28/21 for the exam, diagnosis, procedures, and orders are all accurate and complete.    Lakyn Alsteen Cranford Mon, MD  [facility] [phone] (phone) [phone] (fax)  [place]

## 2021-07-25 NOTE — Telephone Encounter (Signed)
Called patient. No answer. Lmtcb.   3 attempts have been made to contact patient. Will send results letter. Closing this encounter.

## 2021-07-26 ENCOUNTER — Encounter: Payer: Self-pay | Admitting: Family Medicine

## 2021-07-26 ENCOUNTER — Ambulatory Visit (INDEPENDENT_AMBULATORY_CARE_PROVIDER_SITE_OTHER): Payer: Medicare Other | Admitting: Family Medicine

## 2021-07-26 VITALS — BP 110/71 | HR 96 | Wt 196.9 lb

## 2021-07-26 DIAGNOSIS — M5442 Lumbago with sciatica, left side: Secondary | ICD-10-CM

## 2021-07-26 DIAGNOSIS — I428 Other cardiomyopathies: Secondary | ICD-10-CM | POA: Diagnosis not present

## 2021-07-26 DIAGNOSIS — E11649 Type 2 diabetes mellitus with hypoglycemia without coma: Secondary | ICD-10-CM

## 2021-07-26 DIAGNOSIS — F411 Generalized anxiety disorder: Secondary | ICD-10-CM

## 2021-07-26 DIAGNOSIS — I872 Venous insufficiency (chronic) (peripheral): Secondary | ICD-10-CM | POA: Diagnosis not present

## 2021-07-26 DIAGNOSIS — I482 Chronic atrial fibrillation, unspecified: Secondary | ICD-10-CM

## 2021-07-26 DIAGNOSIS — E039 Hypothyroidism, unspecified: Secondary | ICD-10-CM | POA: Diagnosis not present

## 2021-07-26 DIAGNOSIS — Z789 Other specified health status: Secondary | ICD-10-CM

## 2021-07-26 DIAGNOSIS — Z87898 Personal history of other specified conditions: Secondary | ICD-10-CM

## 2021-07-26 DIAGNOSIS — G8929 Other chronic pain: Secondary | ICD-10-CM

## 2021-07-26 DIAGNOSIS — N3020 Other chronic cystitis without hematuria: Secondary | ICD-10-CM

## 2021-07-26 DIAGNOSIS — M5441 Lumbago with sciatica, right side: Secondary | ICD-10-CM

## 2021-07-26 DIAGNOSIS — G2581 Restless legs syndrome: Secondary | ICD-10-CM

## 2021-07-26 LAB — POCT INR: INR: 1.5 — AB (ref 2.0–3.0)

## 2021-07-26 MED ORDER — EZETIMIBE 10 MG PO TABS: 10.0000 mg | ORAL_TABLET | Freq: Every day | ORAL | 2 refills | Status: AC

## 2021-07-26 MED ORDER — ONDANSETRON HCL 4 MG PO TABS: 4.0000 mg | ORAL_TABLET | Freq: Two times a day (BID) | ORAL | 2 refills | Status: AC | PRN

## 2021-07-26 MED ORDER — ROPINIROLE HCL 0.25 MG PO TABS: ORAL_TABLET | ORAL | 1 refills | Status: DC

## 2021-07-27 ENCOUNTER — Ambulatory Visit: Payer: Self-pay

## 2021-07-27 ENCOUNTER — Ambulatory Visit: Payer: Self-pay | Admitting: *Deleted

## 2021-07-27 NOTE — Telephone Encounter (Signed)
  Chief Complaint: wound- excessive bleeding last night, dizziness, fatigue, palpitations. Patient states her ankle was "spurting blood" Symptoms: see above Frequency: started last night- wound opened and bled excessive- patient on blood thinner- states she has pools of blood in her floor Pertinent Negatives: Patient denies active bleeding now. Disposition: '[x]'$ ED /'[]'$ Urgent Care (no appt availability in office) / '[]'$ Appointment(In office/virtual)/ '[]'$  [place] [facility]/ '[]'$ Home Care/ '[]'$ Refused Recommended Disposition /'[]'$ [place] [facility]/ '[]'$  Follow-up with PCP Additional Notes: Due to multiple symptoms- ED recommended.

## 2021-07-27 NOTE — Telephone Encounter (Signed)
Too sick to go to ED- wont call 911.legs weak. Tried to explain to pt why she may be so dizzy due to lack of oxygen to her brain. Stated will wait until her children get off work. Advised pt not to wait and to call one of her children. Pt stated she is unsure of what she will do.   Pt asking if she whould take 4 mg Coumadin as ordered or 3 mg ? Please call pt back with Dr. Marlan Palau recommendation. Reason for Disposition  [1] Caller requesting NON-URGENT health information AND [2] PCP's office is the best resource  Answer Assessment - Initial Assessment Questions 1. REASON FOR CALL or QUESTION: "What is your reason for calling today?" or "How can I best help you?" or "What question do you have that I can help answer?"     Pt did not go to ED. Pt called to see if there is something Dr Rosanna Randy can do without her having to go to ED.  Protocols used: Information Only Call - No Triage-A-AH

## 2021-07-27 NOTE — Telephone Encounter (Signed)
Reason for Disposition  Wound causes weakness (i.e., decreased ability to move hand, finger, toe)  [1] MODERATE dizziness (e.g., interferes with normal activities) AND [2] has NOT been evaluated by physician for this  (Exception: dizziness caused by heat exposure, sudden standing, or poor fluid intake)  Answer Assessment - Initial Assessment Questions 1. APPEARANCE of INJURY: "What does the injury look like?"      Patient has ankle wound- possible insect bites- patient had been treating- opened last night- patient states she lost a lot of blood when she bled - 20 minutes 2. SIZE: "How large is the cut?"      2-3 inches diameter 3. BLEEDING: "Is it bleeding now?" If Yes, ask: "Is it difficult to stop?"      Not bleeding now 4. LOCATION: "Where is the injury located?"      Ankle- left 5. ONSET: "How long ago did the injury occur?"      1 week- opened last night 6. MECHANISM: "Tell me how it happened."      Insect bites 7. TETANUS: "When was the last tetanus booster?"     Up to date 8. PREGNANCY: "Is there any chance you are pregnant?" "When was your last menstrual period?"  Answer Assessment - Initial Assessment Questions 1. DESCRIPTION: "Describe your dizziness."     Dizziness- faint 2. LIGHTHEADED: "Do you feel lightheaded?" (e.g., somewhat faint, woozy, weak upon standing)     yes 3. VERTIGO: "Do you feel like either you or the room is spinning or tilting?" (i.e. vertigo)     no 4. SEVERITY: "How bad is it?"  "Do you feel like you are going to faint?" "Can you stand and walk?"   - MILD: Feels slightly dizzy, but walking normally.   - MODERATE: Feels unsteady when walking, but not falling; interferes with normal activities (e.g., school, work).   - SEVERE: Unable to walk without falling, or requires assistance to walk without falling; feels like passing out now.      moderate 5. ONSET:  "When did the dizziness begin?"     This morning 6. AGGRAVATING FACTORS: "Does anything make it  worse?" (e.g., standing, change in head position)       7. HEART RATE: "Can you tell me your heart rate?" "How many beats in 15 seconds?"  (Note: not all patients can do this)       *No Answer* 8. CAUSE: "What do you think is causing the dizziness?"     Excessive bleeding from ankle wound 9. RECURRENT SYMPTOM: "Have you had dizziness before?" If Yes, ask: "When was the last time?" "What happened that time?"     *No Answer* 10. OTHER SYMPTOMS: "Do you have any other symptoms?" (e.g., fever, chest pain, vomiting, diarrhea, bleeding)       *No Answer* 11. PREGNANCY: "Is there any chance you are pregnant?" "When was your last menstrual period?"       *No Answer*  Protocols used: Cuts and Lacerations-A-AH, Dizziness - Lightheadedness-A-AH

## 2021-07-27 NOTE — Telephone Encounter (Signed)
FYI

## 2021-07-28 ENCOUNTER — Telehealth: Payer: Self-pay

## 2021-07-28 NOTE — Telephone Encounter (Signed)
She went and had her markers done and it was 1.5. she thought she was supposed to let you know. She is supposed to have a procedure Thursday and stop her Warfarin and didn't want to stop it if she wouldn't be able to have a procedure.Please call her back. She said you could leave instructions on her machine if she is not there.

## 2021-07-28 NOTE — Telephone Encounter (Signed)
Spoke with patient and her INR is 1.5 and they increased her coumadin.  She will stop taking her coumadin after Fridays dose. Dr Dossie Arbour notified.

## 2021-07-29 ENCOUNTER — Telehealth: Payer: Self-pay | Admitting: Cardiovascular Disease

## 2021-07-29 NOTE — Telephone Encounter (Signed)
Pt c/o BP issue: STAT if pt c/o blurred vision, one-sided weakness or slurred speech  1. What are your last 5 BP readings?  06/'30/23 91/62 77/49 '$ 79/40 HR 79 84/58 HR 55  2. Are you having any other symptoms (ex. Dizziness, headache, blurred vision, passed out)? Dizziness and feeling out of it.  3. What is your BP issue? Hypotension for the past 2-3 days. Patient states she lost a lot of blood from her leg on 07/27/21 from a wound she got from scratching a mosquito bite that had scabbed over. She reports ever since then she has been experiencing these symptoms.  She is unsure on what to do.

## 2021-07-29 NOTE — Telephone Encounter (Signed)
Spoke with pt for approximately 25 minutes on the phone. See BP/HR readings below all taken this morning.  Pt proceeded to explain she had "picked a place on leg" Wednesday evening and was concerned after she "lost a lot of blood" from leg. Pt notes area bled for approximately 20 min and has not had any further issues since. Pt concerned that this caused her BP to be low.   Pt denies chest pain. Denies shortness of breath except "only on exertion." Pt denies dizziness at this time. Does report "I am dizzy sometimes." Pt denies swelling. Wt this AM was 192 lb.   Confirmed pt is taking meds per med list other than pt takes Torsemide 30 mg daily instead of prescribed 40 mg daily.   Had pt recheck BP while on the phone and BP 131/77 HR 77.   Advised pt continue to monitor BP/HR and let us know of any recurrent s/s.   Pt appreciative of call and has no further questions/concerns at this time.

## 2021-08-03 ENCOUNTER — Encounter: Payer: Medicare Other | Admitting: Pain Medicine

## 2021-08-03 ENCOUNTER — Telehealth: Payer: Self-pay | Admitting: Pain Medicine

## 2021-08-03 NOTE — Telephone Encounter (Signed)
Asking about eating the a.m. before procedure. I told her that she may eat a light breakfast because she will not be receiving sedation. She also states that she has discontinued Coumadin 5 days prior to procedure.

## 2021-08-03 NOTE — Telephone Encounter (Signed)
Patient called in to see what exactly she needs to do before coming in for her appt on 08-04-21. Please give patient a call. Thanks

## 2021-08-03 NOTE — Progress Notes (Unsigned)
PROVIDER NOTE: Interpretation of information contained herein should be left to medically-trained personnel. Specific patient instructions are provided elsewhere under "Patient Instructions" section of medical record. This document was created in part using STT-dictation technology, any transcriptional errors that may result from this process are unintentional.  Patient: Tracy Mann Type: Established DOB: 22-Jan-1934 MRN: [id number] PCP: Jerrol Banana., MD  Service: Procedure DOS: 08/04/2021 Setting: Ambulatory Location: Ambulatory outpatient facility Delivery: Face-to-face Provider: Gaspar Cola, MD Specialty: Interventional Pain Management Specialty designation: 09 Location: Outpatient facility Ref. Prov.: Milinda Pointer, MD    Primary Reason for Visit: Interventional Pain Management Treatment. CC: No chief complaint on file.   Procedure:           Type: Lumbar epidural steroid injection (LESI) (interlaminar) #2    Laterality: Midline   Level:  L4-5 Level.  Imaging: Fluoroscopic guidance Anesthesia: Local anesthesia (1-2% Lidocaine) Anxiolysis: None                 Sedation: None. DOS: 08/04/2021  Performed by: Gaspar Cola, MD  Purpose: Diagnostic/Therapeutic Indications: Lumbar radicular pain of intraspinal etiology of more than 4 weeks that has failed to respond to conservative therapy and is severe enough to impact quality of life or function. 1. Lumbosacral radiculopathy at L5 (Bilateral)   2. Grade 1 Anterolisthesis of lumbar spine (L4/L5)   3. Lumbar lateral recess stenosis (L4-5) (Right)   4. Lumbar central spinal stenosis (SEVERE) (L3-4) with neurogenic claudication   5. DDD (degenerative disc disease), lumbosacral   6. Lumbosacral spondylosis with radiculopathy   7. Chronic lower extremity pain (1ry area of Pain) (Bilateral)   8. Chronic low back pain (3ry area of Pain) (Bilateral) w/ sciatica (Bilateral)   9. Chronic anticoagulation  (Coumadin)    NAS-11 Pain score:   Pre-procedure:  /10   Post-procedure:  /10      Position / Prep / Materials:  Position: Prone w/ head of the table raised (slight reverse trendelenburg) to facilitate breathing.  Prep solution: DuraPrep (Iodine Povacrylex [0.7% available iodine] and Isopropyl Alcohol, 74% w/w) Prep Area: Entire Posterior Lumbar Region from lower scapular tip down to mid buttocks area and from flank to flank. Materials:  Tray: Epidural tray Needle(s):  Type: Epidural needle (Tuohy) Gauge (G):  17 Length: Regular (3.5-in) Qty: 1  Pre-op H&P Assessment:  Tracy Mann is a 88 y.o. (year old), female patient, seen today for interventional treatment. She  has a past surgical history that includes Vesicovaginal fistula closure w/ TAH (1996); Tubal ligation (1968); Cholecystectomy (1999); and Cardiac catheterization (11/2012). Tracy Mann has a current medication list which includes the following prescription(s): accu-chek aviva plus, accu-chek softclix lancets, albuterol, ezetimibe, flavoxate, fluticasone, hydrocodone-acetaminophen, [START ON 08/29/2021] hydrocodone-acetaminophen, [START ON 09/28/2021] hydrocodone-acetaminophen, hydrocodone-acetaminophen, lantus solostar, insulin lispro, relion pen needle 31g/52m, levothyroxine, lisinopril, lorazepam, magnesium, metolazone, nitroglycerin, NONFORMULARY OR COMPOUNDED ITEM, ondansetron, pantoprazole, potassium chloride sa, ropinirole, torsemide, and warfarin. Her primarily concern today is the No chief complaint on file.  Initial Vital Signs:  Pulse/HCG Rate:    Temp:   Resp:   BP:   SpO2:    BMI: Estimated body mass index is 32.77 kg/m as calculated from the following:   Height as of 07/06/21: '5\' 5"'$  (1.651 m).   Weight as of 07/26/21: 196 lb 14.4 oz (89.3 kg).  Risk Assessment: Allergies: Reviewed. She is allergic to duloxetine, other, paroxetine, sulfa antibiotics, cephalexin, clarithromycin, diphenhydramine, estrogens conjugated,  estrogens conjugated, sulfonamide derivatives, and nitrofurantoin.  Allergy Precautions: None  required Coagulopathies: Reviewed. None identified.  Blood-thinner therapy: None at this time Active Infection(s): Reviewed. None identified. [name] is afebrile  Site Confirmation: Tracy Mann was asked to confirm the procedure and laterality before marking the site Procedure checklist: Completed Consent: Before the procedure and under the influence of no sedative(s), amnesic(s), or anxiolytics, the patient was informed of the treatment options, risks and possible complications. To fulfill our ethical and legal obligations, as recommended by the American Medical Association's Code of Ethics, I have informed the patient of my clinical impression; the nature and purpose of the treatment or procedure; the risks, benefits, and possible complications of the intervention; the alternatives, including doing nothing; the risk(s) and benefit(s) of the alternative treatment(s) or procedure(s); and the risk(s) and benefit(s) of doing nothing. The patient was provided information about the general risks and possible complications associated with the procedure. These may include, but are not limited to: failure to achieve desired goals, infection, bleeding, organ or nerve damage, allergic reactions, paralysis, and death. In addition, the patient was informed of those risks and complications associated to Spine-related procedures, such as failure to decrease pain; infection (i.e.: Meningitis, epidural or intraspinal abscess); bleeding (i.e.: epidural hematoma, subarachnoid hemorrhage, or any other type of intraspinal or peri-dural bleeding); organ or nerve damage (i.e.: Any type of peripheral nerve, nerve root, or spinal cord injury) with subsequent damage to sensory, motor, and/or autonomic systems, resulting in permanent pain, numbness, and/or weakness of one or several areas of the body; allergic reactions; (i.e.: anaphylactic  reaction); and/or death. Furthermore, the patient was informed of those risks and complications associated with the medications. These include, but are not limited to: allergic reactions (i.e.: anaphylactic or anaphylactoid reaction(s)); adrenal axis suppression; blood sugar elevation that in diabetics may result in ketoacidosis or comma; water retention that in patients with history of congestive heart failure may result in shortness of breath, pulmonary edema, and decompensation with resultant heart failure; weight gain; swelling or edema; medication-induced neural toxicity; particulate matter embolism and blood vessel occlusion with resultant organ, and/or nervous system infarction; and/or aseptic necrosis of one or more joints. Finally, the patient was informed that Medicine is not an exact science; therefore, there is also the possibility of unforeseen or unpredictable risks and/or possible complications that may result in a catastrophic outcome. The patient indicated having understood very clearly. We have given the patient no guarantees and we have made no promises. Enough time was given to the patient to ask questions, all of which were answered to the patient's satisfaction. Ms. Shatz has indicated that she wanted to continue with the procedure. Attestation: I, the ordering provider, attest that I have discussed with the patient the benefits, risks, side-effects, alternatives, likelihood of achieving goals, and potential problems during recovery for the procedure that I have provided informed consent. Date  Time: {CHL [facility]-PAIN TIME CHOICES:[id number]}  Pre-Procedure Preparation:  Monitoring: As per clinic protocol. Respiration, ETCO2, SpO2, BP, heart rate and rhythm monitor placed and checked for adequate function Safety Precautions: Patient was assessed for positional comfort and pressure points before starting the procedure. Time-out: I initiated and conducted the "Time-out" before starting the  procedure, as per protocol. The patient was asked to participate by confirming the accuracy of the "Time Out" information. Verification of the correct person, site, and procedure were performed and confirmed by me, the nursing staff, and the patient. "Time-out" conducted as per Joint Commission's Universal Protocol (UP.01.01.01). Time:    Description/Narrative of Procedure:  Target: Epidural space via interlaminar opening, initially targeting the lower laminar border of the superior vertebral body. Region: Lumbar Approach: Percutaneous paravertebral  Rationale (medical necessity): procedure needed and proper for the diagnosis and/or treatment of the patient's medical symptoms and needs. Procedural Technique Safety Precautions: Aspiration looking for blood return was conducted prior to all injections. At no point did we inject any substances, as a needle was being advanced. No attempts were made at seeking any paresthesias. Safe injection practices and needle disposal techniques used. Medications properly checked for expiration dates. SDV (single dose vial) medications used. Description of the Procedure: Protocol guidelines were followed. The procedure needle was introduced through the skin, ipsilateral to the reported pain, and advanced to the target area. Bone was contacted and the needle walked caudad, until the lamina was cleared. The epidural space was identified using "loss-of-resistance technique" with 2-3 ml of PF-NaCl (0.9% NSS), in a 5cc LOR glass syringe.  There were no vitals filed for this visit.  Start Time:   hrs. End Time:   hrs.  Imaging Guidance (Spinal):          Type of Imaging Technique: Fluoroscopy Guidance (Spinal) Indication(s): Assistance in needle guidance and placement for procedures requiring needle placement in or near specific anatomical locations not easily accessible without such assistance. Exposure Time: Please see nurses notes. Contrast: Before injecting  any contrast, we confirmed that the patient did not have an allergy to iodine, shellfish, or radiological contrast. Once satisfactory needle placement was completed at the desired level, radiological contrast was injected. Contrast injected under live fluoroscopy. No contrast complications. See chart for type and volume of contrast used. Fluoroscopic Guidance: I was personally present during the use of fluoroscopy. "Tunnel Vision Technique" used to obtain the best possible view of the target area. Parallax error corrected before commencing the procedure. "Direction-depth-direction" technique used to introduce the needle under continuous pulsed fluoroscopy. Once target was reached, antero-posterior, oblique, and lateral fluoroscopic projection used confirm needle placement in all planes. Images permanently stored in EMR. Interpretation: I personally interpreted the imaging intraoperatively. Adequate needle placement confirmed in multiple planes. Appropriate spread of contrast into desired area was observed. No evidence of afferent or efferent intravascular uptake. No intrathecal or subarachnoid spread observed. Permanent images saved into the patient's record.  Antibiotic Prophylaxis:   Anti-infectives (From admission, onward)    None      Indication(s): None identified  Post-operative Assessment:  Post-procedure Vital Signs:  Pulse/HCG Rate:    Temp:   Resp:   BP:   SpO2:    EBL: None  Complications: No immediate post-treatment complications observed by team, or reported by patient.  Note: The patient tolerated the entire procedure well. A repeat set of vitals were taken after the procedure and the patient was kept under observation following institutional policy, for this type of procedure. Post-procedural neurological assessment was performed, showing return to baseline, prior to discharge. The patient was provided with post-procedure discharge instructions, including a section on how to  identify potential problems. Should any problems arise concerning this procedure, the patient was given instructions to immediately contact us, at any time, without hesitation. In any case, we plan to contact the patient by telephone for a follow-up status report regarding this interventional procedure.  Comments:  No additional relevant information.  Plan of Care  Orders:  No orders of the defined types were placed in this encounter.  Chronic Opioid Analgesic:  Hydrocodone/APAP 10/325, (see the 05/09/2021 note) using 1.675 pills/day. MME/day: 10-20  mg/day   Medications ordered for procedure: No orders of the defined types were placed in this encounter.  Medications administered: Royston Cowper. Gott had no medications administered during this visit.  See the medical record for exact dosing, route, and time of administration.  Follow-up plan:   No follow-ups on file.       Interventional Therapies  Risk  Complexity Considerations:   Estimated body mass index is 35.73 kg/m as calculated from the following:   Height as of this encounter: 5' 5.5" (1.664 m).   Weight as of this encounter: 218 lb (98.9 kg). NOTE: COUMADIN Anticoagulation (Stop: 5 days  Restart: 2 hours) Decreased GFR Obstructive sleep apnea   Planned  Pending:   Diagnostic/therapeutic midline L4-5 LESI #2    Under consideration:   Diagnostic midline caudal ESI #1 + diagnostic epidurogram  Diagnostic bilateral IA hip joint injection #1  Diagnostic right L3-4 LESI #1  Diagnostic right L4 TFESI #1  Diagnostic bilateral L3 TFESI #1  Diagnostic bilateral lumbar facet MBB #1    Completed:   Diagnostic/therapeutic midline L4-5 LESI x1 (09/28/2020) (100/100/100/50)    Completed by Dr. Alba Destine [facility]):   Diagnostic bilateral L4 TFESI x1 (09/12/2019 - Dr. Alba Destine [[facility]])    Therapeutic  Palliative (PRN) options:   None established     Recent Visits Date Type Provider Dept  07/13/21 Office Visit Milinda Pointer, MD  [facility]  07/06/21 Office Visit Milinda Pointer, MD [facility]  05/09/21 Office Visit Milinda Pointer, MD [facility]  Showing recent visits within past 90 days and meeting all other requirements Future Appointments Date Type Provider Dept  08/04/21 Appointment Milinda Pointer, MD [facility]  10/19/21 Appointment Milinda Pointer, MD [facility]  Showing future appointments within next 90 days and meeting all other requirements  Disposition: Discharge home  Discharge (Date  Time): 08/04/2021;   hrs.   Primary Care Physician: Jerrol Banana., MD Location: [facility] Note by: Gaspar Cola, MD Date: 08/04/2021; Time: 4:20 PM  Disclaimer:  Medicine is not an Chief Strategy Officer. The only guarantee in medicine is that nothing is guaranteed. It is important to note that the decision to proceed with this intervention was based on the information collected from the patient. The Data and conclusions were drawn from the patient's questionnaire, the interview, and the physical examination. Because the information was provided in large part by the patient, it cannot be guaranteed that it has not been purposely or unconsciously manipulated. Every effort has been made to obtain as much relevant data as possible for this evaluation. It is important to note that the conclusions that lead to this procedure are derived in large part from the available data. Always take into account that the treatment will also be dependent on availability of resources and existing treatment guidelines, considered by other Pain Management Practitioners as being common knowledge and practice, at the time of the intervention. For Medico-Legal purposes, it is also important to point out that variation in procedural techniques and pharmacological choices are the acceptable norm. The indications, contraindications, technique, and results  of the above procedure should only be interpreted and judged by a Board-Certified Interventional Pain Specialist with extensive familiarity and expertise in the same exact procedure and technique.

## 2021-08-04 ENCOUNTER — Ambulatory Visit
Admission: RE | Admit: 2021-08-04 | Discharge: 2021-08-04 | Disposition: A | Discharge status: Home / Self Care | Payer: Medicare Other | Source: Ambulatory Visit | Attending: Pain Medicine | Admitting: Pain Medicine

## 2021-08-04 ENCOUNTER — Encounter: Payer: Self-pay | Admitting: Pain Medicine

## 2021-08-04 ENCOUNTER — Ambulatory Visit: Payer: Medicare Other | Attending: Pain Medicine | Admitting: Pain Medicine

## 2021-08-04 VITALS — BP 151/62 | HR 96 | Temp 97.4°F | Resp 16 | Ht 65.0 in | Wt 193.0 lb

## 2021-08-04 DIAGNOSIS — M5441 Lumbago with sciatica, right side: Secondary | ICD-10-CM | POA: Diagnosis present

## 2021-08-04 DIAGNOSIS — M5442 Lumbago with sciatica, left side: Secondary | ICD-10-CM | POA: Insufficient documentation

## 2021-08-04 DIAGNOSIS — M79605 Pain in left leg: Secondary | ICD-10-CM | POA: Diagnosis present

## 2021-08-04 DIAGNOSIS — M5137 Other intervertebral disc degeneration, lumbosacral region: Secondary | ICD-10-CM | POA: Insufficient documentation

## 2021-08-04 DIAGNOSIS — M79604 Pain in right leg: Secondary | ICD-10-CM | POA: Diagnosis present

## 2021-08-04 DIAGNOSIS — G8929 Other chronic pain: Secondary | ICD-10-CM | POA: Insufficient documentation

## 2021-08-04 DIAGNOSIS — Z7901 Long term (current) use of anticoagulants: Secondary | ICD-10-CM | POA: Insufficient documentation

## 2021-08-04 DIAGNOSIS — M48062 Spinal stenosis, lumbar region with neurogenic claudication: Secondary | ICD-10-CM | POA: Insufficient documentation

## 2021-08-04 DIAGNOSIS — M5417 Radiculopathy, lumbosacral region: Secondary | ICD-10-CM | POA: Insufficient documentation

## 2021-08-04 DIAGNOSIS — M4316 Spondylolisthesis, lumbar region: Secondary | ICD-10-CM | POA: Insufficient documentation

## 2021-08-04 DIAGNOSIS — M48061 Spinal stenosis, lumbar region without neurogenic claudication: Secondary | ICD-10-CM | POA: Diagnosis present

## 2021-08-04 DIAGNOSIS — M4727 Other spondylosis with radiculopathy, lumbosacral region: Secondary | ICD-10-CM | POA: Insufficient documentation

## 2021-08-04 MED ORDER — IOHEXOL 180 MG/ML  SOLN
10.0000 mL | Freq: Once | INTRAMUSCULAR | Status: AC
  Administered 2021-08-04: 10 mL via EPIDURAL

## 2021-08-04 MED ORDER — ROPIVACAINE HCL 2 MG/ML IJ SOLN
INTRAMUSCULAR | Status: AC
  Filled 2021-08-04: qty 20

## 2021-08-04 MED ORDER — SODIUM CHLORIDE 0.9% FLUSH
2.0000 mL | Freq: Once | INTRAVENOUS | Status: AC
  Administered 2021-08-04: 2 mL

## 2021-08-04 MED ORDER — MIDAZOLAM HCL 5 MG/5ML IJ SOLN: 0.5000 mg | Freq: Once | INTRAMUSCULAR | Status: DC

## 2021-08-04 MED ORDER — TRIAMCINOLONE ACETONIDE 40 MG/ML IJ SUSP
INTRAMUSCULAR | Status: AC
  Filled 2021-08-04: qty 1

## 2021-08-04 MED ORDER — PENTAFLUOROPROP-TETRAFLUOROETH EX AERO
INHALATION_SPRAY | Freq: Once | CUTANEOUS | Status: AC
  Administered 2021-08-04: 30 via TOPICAL

## 2021-08-04 MED ORDER — TRIAMCINOLONE ACETONIDE 40 MG/ML IJ SUSP
40.0000 mg | Freq: Once | INTRAMUSCULAR | Status: AC
  Administered 2021-08-04: 40 mg

## 2021-08-04 MED ORDER — LIDOCAINE HCL 2 % IJ SOLN
20.0000 mL | Freq: Once | INTRAMUSCULAR | Status: AC
  Administered 2021-08-04: 400 mg

## 2021-08-04 MED ORDER — IOHEXOL 180 MG/ML  SOLN
INTRAMUSCULAR | Status: AC
  Filled 2021-08-04: qty 20

## 2021-08-04 MED ORDER — LIDOCAINE HCL 2 % IJ SOLN
INTRAMUSCULAR | Status: AC
  Filled 2021-08-04: qty 20

## 2021-08-04 MED ORDER — SODIUM CHLORIDE (PF) 0.9 % IJ SOLN
INTRAMUSCULAR | Status: AC
  Filled 2021-08-04: qty 10

## 2021-08-04 MED ORDER — ROPIVACAINE HCL 2 MG/ML IJ SOLN
2.0000 mL | Freq: Once | INTRAMUSCULAR | Status: AC
  Administered 2021-08-04: 2 mL via EPIDURAL

## 2021-08-04 MED ORDER — LACTATED RINGERS IV SOLN: Freq: Once | INTRAVENOUS | Status: DC

## 2021-08-04 NOTE — Patient Instructions (Signed)

## 2021-08-05 ENCOUNTER — Telehealth: Payer: Self-pay | Admitting: *Deleted

## 2021-08-05 NOTE — Telephone Encounter (Signed)
Post procedure call;  reports that her sugars have gone up and she is changing them every 2 hours and using her insulin as appropriate.  Pain is better.

## 2021-08-10 ENCOUNTER — Other Ambulatory Visit: Payer: Self-pay | Admitting: Family Medicine

## 2021-08-14 NOTE — Progress Notes (Unsigned)
PROVIDER NOTE: Information contained herein reflects review and annotations entered in association with encounter. Interpretation of such information and data should be left to medically-trained personnel. Information provided to patient can be located elsewhere in the medical record under "Patient Instructions". Document created using STT-dictation technology, any transcriptional errors that may result from process are unintentional.    Patient: Tracy Mann  Service Category: E/M  Provider: Gaspar Cola, MD  DOB: Jun 27, 1933  DOS: 08/16/2021  Specialty: Interventional Pain Management  MRN: [id number]  Setting: Ambulatory outpatient  PCP: Jerrol Banana., MD  Type: Established Patient    Referring Provider: Jerrol Banana.,*  Location: Office  Delivery: Face-to-face     HPI  Ms. JOHNSIE MOSCOSO, a 88 y.o. year old female, is here today because of her No primary diagnosis found.. Ms. Legrande's primary complain today is No chief complaint on file. Last encounter: My last encounter with her was on 08/04/2021. Pertinent problems: Ms. Stilley has Cervical muscle strain; DDD (degenerative disc disease), lumbosacral; Chronic venous insufficiency; Chronic pain syndrome; Abnormal MRI, lumbar spine (08/22/2019); Lumbar facet hypertrophy (Multilevel) (Bilateral); Lumbar central spinal stenosis (SEVERE) (L3-4) with neurogenic claudication; Grade 1 Anterolisthesis of lumbar spine (L4/L5); Lumbar facet arthropathy (L3-4 right-sided facet edema/joint effusion); Lumbar lateral recess stenosis (L4-5) (Right); Chronic low back pain (3ry area of Pain) (Bilateral) w/ sciatica (Bilateral); Lumbosacral radiculopathy at L5 (Bilateral); Chronic lower extremity pain (1ry area of Pain) (Bilateral); Lumbosacral facet syndrome; Chronic hip pain (2ry area of Pain) (Bilateral) (R>L); Osteoarthritis involving multiple joints; Osteoarthritis of knee (Right); Osteoarthritis of facet joint of lumbar spine; Osteoarthritis of  lumbar spine; Bilateral lower extremity edema; Cellulitis of right lower leg; Chronic knee pain (Bilateral); Peripheral vascular disease ([place]) (lower extremity) (Bilateral); Cervicalgia; Chronic upper back pain; and Lumbosacral spondylosis with radiculopathy on their pertinent problem list. Pain Assessment: Severity of   is reported as a  /10. Location:    / . Onset:  . Quality:  . Timing:  . Modifying factor(s):  Marland Kitchen Vitals:  vitals were not taken for this visit.   Reason for encounter: post-procedure evaluation and assessment. ***  Post-procedure evaluation   Type: Lumbar epidural steroid injection (LESI) (interlaminar) #2    Laterality: Midline   Level:  L4-5 Level.  Imaging: Fluoroscopic guidance Anesthesia: Local anesthesia (1-2% Lidocaine) Anxiolysis: None                 Sedation: None. DOS: 08/04/2021  Performed by: Gaspar Cola, MD  Purpose: Diagnostic/Therapeutic Indications: Lumbar radicular pain of intraspinal etiology of more than 4 weeks that has failed to respond to conservative therapy and is severe enough to impact quality of life or function. 1. Lumbosacral radiculopathy at L5 (Bilateral)   2. Grade 1 Anterolisthesis of lumbar spine (L4/L5)   3. Lumbar lateral recess stenosis (L4-5) (Right)   4. Lumbar central spinal stenosis (SEVERE) (L3-4) with neurogenic claudication   5. DDD (degenerative disc disease), lumbosacral   6. Lumbosacral spondylosis with radiculopathy   7. Chronic lower extremity pain (1ry area of Pain) (Bilateral)   8. Chronic low back pain (3ry area of Pain) (Bilateral) w/ sciatica (Bilateral)   9. Chronic anticoagulation (Coumadin)    NAS-11 Pain score:   Pre-procedure: 4 /10   Post-procedure: 0-No pain/10      Effectiveness:  Initial hour after procedure:   ***. Subsequent 4-6 hours post-procedure:   ***. Analgesia past initial 6 hours:   ***. Ongoing improvement:  Analgesic:  *** Function:    ***  ROM:    ***     Pharmacotherapy  Assessment  Analgesic: Hydrocodone/APAP 10/325, (see the 05/09/2021 note) using 1.675 pills/day. MME/day: 10-20 mg/day   Monitoring: [place] PMP: PDMP reviewed during this encounter.       Pharmacotherapy: No side-effects or adverse reactions reported. Compliance: No problems identified. Effectiveness: Clinically acceptable.  No notes on file  UDS:  Summary  Date Value Ref Range Status  05/09/2021 Note  Final    Comment:    ==================================================================== [employer] Select 13 (MW) ==================================================================== Test                             Result       Flag       Units  Drug Present and Declared for Prescription Verification   Lorazepam                      1824         EXPECTED   ng/mg creat    Source of lorazepam is a scheduled prescription medication.    Hydrocodone                    2020         EXPECTED   ng/mg creat   Hydromorphone                  234          EXPECTED   ng/mg creat   Dihydrocodeine                 495          EXPECTED   ng/mg creat   Norhydrocodone                 2173         EXPECTED   ng/mg creat    Sources of hydrocodone include scheduled prescription medications.    Hydromorphone, dihydrocodeine and norhydrocodone are expected    metabolites of hydrocodone. Hydromorphone and dihydrocodeine are    also available as scheduled prescription medications.  ==================================================================== Test                      Result    Flag   Units      Ref Range   Creatinine              80               mg/dL      >=20 ==================================================================== Declared Medications:  The flagging and interpretation on this report are based on the  following declared medications.  Unexpected results may arise from  inaccuracies in the declared medications.   **Note: The testing scope of this panel includes these  medications:   Hydrocodone (Norco)  Lorazepam (Ativan)   **Note: The testing scope of this panel does not include the  following reported medications:   Acetaminophen (Norco)  Albuterol (Ventolin HFA)  Estradiol (Estrace)  Ezetimibe (Zetia)  Flavoxate (Urispas)  Fluticasone (Flonase)  Insulin (Lantus)  Levothyroxine (Synthroid)  Lisinopril (Zestril)  Magnesium  Metolazone  Metoprolol (Toprol)  Nitroglycerin (Nitrostat)  Potassium (Klor-Con)  Ropinirole (Requip)  Torsemide (Demadex)  Undefined Miscellaneous Drug  Warfarin (Coumadin) ==================================================================== For clinical consultation, please call [phone]. ====================================================================      ROS  Constitutional: Denies any fever or chills Gastrointestinal: No reported hemesis, hematochezia, vomiting, or acute GI distress Musculoskeletal:  Denies any acute onset joint swelling, redness, loss of ROM, or weakness Neurological: No reported episodes of acute onset apraxia, aphasia, dysarthria, agnosia, amnesia, paralysis, loss of coordination, or loss of consciousness  Medication Review  Accu-Chek Softclix Lancets, HYDROcodone-acetaminophen, Insulin Pen Needle, LORazepam, Magnesium, NONFORMULARY OR COMPOUNDED ITEM, albuterol, ezetimibe, flavoxATE, fluticasone, glucose blood, insulin glargine, insulin lispro, levothyroxine, lisinopril, metolazone, nitroGLYCERIN, ondansetron, pantoprazole, potassium chloride SA, rOPINIRole, torsemide, and warfarin  History Review  Allergy: Ms. Klett is allergic to duloxetine, other, paroxetine, sulfa antibiotics, cephalexin, clarithromycin, diphenhydramine, estrogens conjugated, estrogens conjugated, sulfonamide derivatives, and nitrofurantoin. Drug: Ms. Salberg  reports no history of drug use. Alcohol:  reports no history of alcohol use. Tobacco:  reports that she has never smoked. She has never used smokeless  tobacco. Social: Ms. Moccio  reports that she has never smoked. She has never used smokeless tobacco. She reports that she does not drink alcohol and does not use drugs. Medical:  has a past medical history of Acute myocardial infarction of inferior wall ([place]), Arthritis, Chronic anticoagulation, Chronic atrial fibrillation ([place]), Chronic combined systolic and diastolic CHF (congestive heart failure) ([place]), Essential hypertension, Hyperlipidemia, mixed, Mixed Ischemic/Nonischemic Cardiomyopathy, Nonobstructive coronary artery disease, Obesity, Thyroid disease, Type II diabetes mellitus ([place]), Venous insufficiency, and Vitamin D deficiency. Surgical: Ms. Bodley  has a past surgical history that includes Vesicovaginal fistula closure w/ TAH (1996); Tubal ligation (1968); Cholecystectomy (1999); and Cardiac catheterization (11/2012). Family: family history includes Heart disease in her mother; Thyroid disease in her father.  Laboratory Chemistry Profile   Renal Lab Results  Component Value Date   BUN 34 (H) 06/29/2021   CREATININE 1.27 (H) 06/29/2021   BCR 31 (H) 06/21/2021   GFRAA 73 12/31/2019   GFRNONAA 41 (L) 06/29/2021    Hepatic Lab Results  Component Value Date   AST 22 06/21/2021   ALT 10 06/21/2021   ALBUMIN 4.0 06/21/2021   ALKPHOS 124 (H) 06/21/2021    Electrolytes Lab Results  Component Value Date   NA 137 06/29/2021   K 4.8 06/29/2021   CL 102 06/29/2021   CALCIUM 9.4 06/29/2021   MG 2.1 11/26/2019   PHOS 3.9 09/08/2014    Bone Lab Results  Component Value Date   25OHVITD1 43 11/26/2019   25OHVITD2 9.2 11/26/2019   25OHVITD3 34 11/26/2019    Inflammation (CRP: Acute Phase) (ESR: Chronic Phase) Lab Results  Component Value Date   CRP 4 11/26/2019   ESRSEDRATE 45 (H) 11/26/2019   LATICACIDVEN 1.4 01/09/2020         Note: Above Lab results reviewed.  Recent Imaging Review  DG PAIN CLINIC C-ARM 1-60 MIN NO REPORT Fluoro was used, but no Radiologist  interpretation will be provided.  Please refer to "NOTES" tab for provider progress note. Note: Reviewed        Physical Exam  General appearance: Well nourished, well developed, and well hydrated. In no apparent acute distress Mental status: Alert, oriented x 3 (person, place, & time)       Respiratory: No evidence of acute respiratory distress Eyes: PERLA Vitals: There were no vitals taken for this visit. BMI: Estimated body mass index is 32.12 kg/m as calculated from the following:   Height as of 08/04/21: '5\' 5"'  (1.651 m).   Weight as of 08/04/21: 193 lb (87.5 kg). Ideal: Ideal body weight: 57 kg (125 lb 10.6 oz) Adjusted ideal body weight: 69.2 kg (152 lb 9.6 oz)  Assessment   Diagnosis Status  No diagnosis found. Controlled Controlled Controlled  Updated Problems: No problems updated.  Plan of Care  Problem-specific:  No problem-specific Assessment & Plan notes found for this encounter.  Ms. JAQUANDA WICKERSHAM has a current medication list which includes the following long-term medication(s): albuterol, ezetimibe, fluticasone, hydrocodone-acetaminophen, [START ON 08/29/2021] hydrocodone-acetaminophen, [START ON 09/28/2021] hydrocodone-acetaminophen, hydrocodone-acetaminophen, insulin lispro, levothyroxine, lisinopril, metolazone, nitroglycerin, pantoprazole, potassium chloride sa, ropinirole, torsemide, and warfarin.  Pharmacotherapy (Medications Ordered): No orders of the defined types were placed in this encounter.  Orders:  No orders of the defined types were placed in this encounter.  Follow-up plan:   No follow-ups on file.     Interventional Therapies  Risk  Complexity Considerations:   Estimated body mass index is 35.73 kg/m as calculated from the following:   Height as of this encounter: 5' 5.5" (1.664 m).   Weight as of this encounter: 218 lb (98.9 kg). NOTE: COUMADIN Anticoagulation (Stop: 5 days  Restart: 2 hours) Decreased GFR Obstructive sleep apnea    Planned  Pending:   Diagnostic/therapeutic midline L4-5 LESI #2    Under consideration:   Diagnostic midline caudal ESI #1 + diagnostic epidurogram  Diagnostic bilateral IA hip joint injection #1  Diagnostic right L3-4 LESI #1  Diagnostic right L4 TFESI #1  Diagnostic bilateral L3 TFESI #1  Diagnostic bilateral lumbar facet MBB #1    Completed:   Diagnostic/therapeutic midline L4-5 LESI x1 (09/28/2020) (100/100/100/50)    Completed by Dr. Alba Destine [facility]):   Diagnostic bilateral L4 TFESI x1 (09/12/2019 - Dr. Alba Destine [[facility]])    Therapeutic  Palliative (PRN) options:   None established      Recent Visits Date Type Provider Dept  08/04/21 Procedure visit Milinda Pointer, MD [facility]  07/13/21 Office Visit Milinda Pointer, MD [facility]  07/06/21 Office Visit Milinda Pointer, MD [facility]  Showing recent visits within past 90 days and meeting all other requirements Future Appointments Date Type Provider Dept  08/16/21 Appointment Milinda Pointer, MD [facility]  10/19/21 Appointment Milinda Pointer, MD [facility]  Showing future appointments within next 90 days and meeting all other requirements  I discussed the assessment and treatment plan with the patient. The patient was provided an opportunity to ask questions and all were answered. The patient agreed with the plan and demonstrated an understanding of the instructions.  Patient advised to call back or seek an in-person evaluation if the symptoms or condition worsens.  Duration of encounter: *** minutes.  Total time on encounter, as per AMA guidelines included both the face-to-face and non-face-to-face time personally spent by the physician and/or other qualified health care professional(s) on the day of the encounter (includes time in activities that require the physician or other qualified health care professional and does not include time in activities  normally performed by clinical staff). Physician's time may include the following activities when performed: preparing to see the patient (eg, review of tests, pre-charting review of records) obtaining and/or reviewing separately obtained history performing a medically appropriate examination and/or evaluation counseling and educating the patient/family/caregiver ordering medications, tests, or procedures referring and communicating with other health care professionals (when not separately reported) documenting clinical information in the electronic or other health record independently interpreting results (not separately reported) and communicating results to the patient/ family/caregiver care coordination (not separately reported)  Note by: Gaspar Cola, MD Date: 08/16/2021; Time: 3:05 PM

## 2021-08-15 ENCOUNTER — Ambulatory Visit: Payer: Medicare Other | Admitting: Physician Assistant

## 2021-08-15 NOTE — Telephone Encounter (Signed)
Pt is calling to check on the status of medication refill. Please advise

## 2021-08-15 NOTE — Telephone Encounter (Signed)
Requested medication (s) are due for refill today: Yes  Requested medication (s) are on the active medication list: Yes  Last refill:  04/21/21  Future visit scheduled: Yes  Notes to clinic:  See request.    Requested Prescriptions  Pending Prescriptions Disp Refills   LORazepam (ATIVAN) 1 MG tablet [Pharmacy Med Name: LORAZEPAM '1MG'$  TABLETS] 60 tablet     Sig: TAKE 1 TABLET(1 MG) BY MOUTH TWICE DAILY AS NEEDED     Not Delegated - Psychiatry: Anxiolytics/Hypnotics 2 Failed - 08/15/2021 12:41 PM      Failed - This refill cannot be delegated      Failed - Urine Drug Screen completed in last 360 days      Passed - Patient is not pregnant      Passed - Valid encounter within last 6 months    Recent Outpatient Visits           2 weeks ago Chronic atrial fibrillation [facility])   [facility] Jerrol Banana., MD   2 months ago Type 2 diabetes mellitus with other circulatory complication, with long-term current use of insulin [facility])   [facility] Jerrol Banana., MD   3 months ago Acute cystitis without hematuria   [facility] [name], PA-C   5 months ago Acute leg pain, left   [facility] Jerrol Banana., MD   5 months ago Subacute sinusitis, unspecified location   [place], Kirstie Peri, MD       Future Appointments             In 1 month [name], Kathlene November, MD [facility]   In 2 months MacDiarmid, Nicki Reaper, MD [place]   In 3 months Jerrol Banana., MD [facility], [place]

## 2021-08-16 ENCOUNTER — Ambulatory Visit: Payer: Medicare Other | Attending: Pain Medicine | Admitting: Pain Medicine

## 2021-08-16 ENCOUNTER — Encounter: Payer: Self-pay | Admitting: Pain Medicine

## 2021-08-16 VITALS — BP 119/45 | HR 75 | Temp 96.9°F | Ht 66.0 in | Wt 193.0 lb

## 2021-08-16 DIAGNOSIS — M79605 Pain in left leg: Secondary | ICD-10-CM | POA: Insufficient documentation

## 2021-08-16 DIAGNOSIS — M5442 Lumbago with sciatica, left side: Secondary | ICD-10-CM | POA: Diagnosis not present

## 2021-08-16 DIAGNOSIS — M4316 Spondylolisthesis, lumbar region: Secondary | ICD-10-CM | POA: Insufficient documentation

## 2021-08-16 DIAGNOSIS — M79604 Pain in right leg: Secondary | ICD-10-CM | POA: Diagnosis present

## 2021-08-16 DIAGNOSIS — G8929 Other chronic pain: Secondary | ICD-10-CM | POA: Insufficient documentation

## 2021-08-16 DIAGNOSIS — M5441 Lumbago with sciatica, right side: Secondary | ICD-10-CM | POA: Diagnosis present

## 2021-08-16 DIAGNOSIS — M5417 Radiculopathy, lumbosacral region: Secondary | ICD-10-CM | POA: Diagnosis not present

## 2021-08-16 NOTE — Progress Notes (Signed)
Safety precautions to be maintained throughout the outpatient stay will include: orient to surroundings, keep bed in low position, maintain call bell within reach at all times, provide assistance with transfer out of bed and ambulation.  

## 2021-08-17 ENCOUNTER — Ambulatory Visit: Payer: Medicare Other

## 2021-08-17 DIAGNOSIS — I482 Chronic atrial fibrillation, unspecified: Secondary | ICD-10-CM | POA: Diagnosis not present

## 2021-08-17 DIAGNOSIS — E11649 Type 2 diabetes mellitus with hypoglycemia without coma: Secondary | ICD-10-CM

## 2021-08-17 LAB — POCT INR
INR: 2 (ref 2.0–3.0)
PT: 23.4

## 2021-08-17 LAB — GLUCOSE, POCT (MANUAL RESULT ENTRY): POC Glucose: 105 mg/dl — AB (ref 70–99)

## 2021-08-17 NOTE — Progress Notes (Signed)
Symptoms of hypoglycemia. Heart rate 125-164.  By time pt left in 80-90.  BP 122/53.  Po2 93.  Pt has taken 4 glucose tabs prior to fingerstick.  Random glucose at that time 105.  Pt stable at departure of office.

## 2021-08-17 NOTE — Patient Instructions (Signed)
Description   Take 4 mg daily except on Monday, Wednesday and Friday take 3 mg. Recheck in 3 weeks.

## 2021-08-24 ENCOUNTER — Telehealth: Payer: Self-pay | Admitting: Pain Medicine

## 2021-08-24 NOTE — Telephone Encounter (Signed)
Please schedule an appointment for patient to discuss pain medication increase.

## 2021-08-24 NOTE — Telephone Encounter (Signed)
PT wants to know if she can get an increase in pain meds. Pain has increase. PT stated that she takes one in more and one at night but in the afternoon she be needed to take an pill. Please give patient a call. Thanks

## 2021-08-25 ENCOUNTER — Telehealth: Payer: Self-pay | Admitting: Pain Medicine

## 2021-08-25 NOTE — Telephone Encounter (Signed)
Patient called to cancel appt for Monday 08-29-21. States she has started using a cream with herbs added to it for her legs and it is helping with the pain. I asked her to bring the recipe with her to her next visit.

## 2021-08-29 ENCOUNTER — Ambulatory Visit: Payer: Medicare Other | Admitting: Pain Medicine

## 2021-09-07 ENCOUNTER — Ambulatory Visit (INDEPENDENT_AMBULATORY_CARE_PROVIDER_SITE_OTHER): Payer: Medicare Other

## 2021-09-07 DIAGNOSIS — I482 Chronic atrial fibrillation, unspecified: Secondary | ICD-10-CM

## 2021-09-07 LAB — POCT INR
INR: 1.6 — AB (ref 2.0–3.0)
PT: 18.9

## 2021-09-14 ENCOUNTER — Telehealth (HOSPITAL_COMMUNITY): Payer: Self-pay

## 2021-09-14 NOTE — Telephone Encounter (Signed)
Pt. Called to confirm that she was seeing one of the providers and not coming just for lab work. She stated she received a message. Confirmed with her that it was a provider she was seeing.

## 2021-09-19 ENCOUNTER — Encounter (HOSPITAL_COMMUNITY): Payer: Self-pay

## 2021-09-19 ENCOUNTER — Ambulatory Visit (HOSPITAL_COMMUNITY)
Admission: RE | Admit: 2021-09-19 | Discharge: 2021-09-19 | Disposition: A | Discharge status: Home / Self Care | Payer: Medicare Other | Source: Ambulatory Visit | Attending: Family Medicine | Admitting: Family Medicine

## 2021-09-19 VITALS — BP 112/70 | HR 86 | Wt 200.8 lb

## 2021-09-19 DIAGNOSIS — Z6832 Body mass index (BMI) 32.0-32.9, adult: Secondary | ICD-10-CM | POA: Insufficient documentation

## 2021-09-19 DIAGNOSIS — I482 Chronic atrial fibrillation, unspecified: Secondary | ICD-10-CM

## 2021-09-19 DIAGNOSIS — I272 Pulmonary hypertension, unspecified: Secondary | ICD-10-CM | POA: Diagnosis not present

## 2021-09-19 DIAGNOSIS — I5032 Chronic diastolic (congestive) heart failure: Secondary | ICD-10-CM | POA: Diagnosis not present

## 2021-09-19 DIAGNOSIS — I110 Hypertensive heart disease with heart failure: Secondary | ICD-10-CM | POA: Insufficient documentation

## 2021-09-19 DIAGNOSIS — R42 Dizziness and giddiness: Secondary | ICD-10-CM | POA: Insufficient documentation

## 2021-09-19 DIAGNOSIS — L905 Scar conditions and fibrosis of skin: Secondary | ICD-10-CM | POA: Diagnosis not present

## 2021-09-19 DIAGNOSIS — I428 Other cardiomyopathies: Secondary | ICD-10-CM | POA: Diagnosis present

## 2021-09-19 DIAGNOSIS — I5042 Chronic combined systolic (congestive) and diastolic (congestive) heart failure: Secondary | ICD-10-CM | POA: Insufficient documentation

## 2021-09-19 DIAGNOSIS — Z79899 Other long term (current) drug therapy: Secondary | ICD-10-CM | POA: Diagnosis not present

## 2021-09-19 DIAGNOSIS — I340 Nonrheumatic mitral (valve) insufficiency: Secondary | ICD-10-CM

## 2021-09-19 LAB — BASIC METABOLIC PANEL
Anion gap: 9 (ref 5–15)
BUN: 45 mg/dL — ABNORMAL HIGH (ref 8–23)
CO2: 25 mmol/L (ref 22–32)
Calcium: 9.1 mg/dL (ref 8.9–10.3)
Chloride: 104 mmol/L (ref 98–111)
Creatinine, Ser: 1.41 mg/dL — ABNORMAL HIGH (ref 0.44–1.00)
GFR, Estimated: 36 mL/min — ABNORMAL LOW (ref >60)
Glucose, Bld: 99 mg/dL (ref 70–99)
Potassium: 5 mmol/L (ref 3.5–5.1)
Sodium: 138 mmol/L (ref 135–145)

## 2021-09-19 LAB — BRAIN NATRIURETIC PEPTIDE: B Natriuretic Peptide: 83.4 pg/mL (ref 0.0–100.0)

## 2021-09-19 NOTE — Patient Instructions (Signed)
Thank you for coming in today  Labs were done today, if any labs are abnormal the clinic will call you No news is good news  TAKE Torsemide 40 mg daily  TAKE Metolazone 2.5 mg with extra Potassium 40 mew today and tomorrow  PLEASE wear compression hose    Do the following things EVERYDAY: Weigh yourself in the morning before breakfast. Write it down and keep it in a log. Take your medicines as prescribed Eat low salt foods--Limit salt (sodium) to 2000 mg per day.  Stay as active as you can everyday Limit all fluids for the day to less than 2 liters  At the [facility], you and your health needs are our priority. As part of our continuing mission to provide you with exceptional heart care, we have created designated Provider Care Teams. These Care Teams include your primary Cardiologist (physician) and Advanced Practice Providers (APPs- Physician Assistants and Nurse Practitioners) who all work together to provide you with the care you need, when you need it.   You may see any of the following providers on your designated Care Team at your next follow up: Dr Glori Bickers Dr Haynes Kerns, NP Lyda Jester, [place] [facility] [place], [place] Audry Riles, PharmD   Please be sure to bring in all your medications bottles to every appointment.   If you have any questions or concerns before your next appointment please send [place] a message through [place] or call our office at [phone].    TO LEAVE A MESSAGE FOR THE NURSE SELECT OPTION 2, PLEASE LEAVE A MESSAGE INCLUDING: YOUR NAME DATE OF BIRTH CALL BACK NUMBER REASON FOR CALL**this is important as we prioritize the call backs  YOU WILL RECEIVE A CALL BACK THE SAME DAY AS LONG AS YOU CALL BEFORE 4:00 PM

## 2021-09-19 NOTE — Progress Notes (Unsigned)
Cardiology Office Note  Date:  09/20/2021   ID:  Stacia, Feazell 1933/11/13, MRN [id number]  PCP:  Jerrol Banana., MD   Chief Complaint  Patient presents with   3 month follow up     "Doing well." Medications reviewed by the patient verbally.     HPI:  Ms. Gleghorn is a 88 year old woman with a PMH significant for  chronic atrial fibrillation on Coumadin therapy,  diastolic and systolic dysfunction, EF 40 to 45% on echo 01/2018  hypertension,  hyperlipidemia,  morbid obesity,   moderate pulmonary hypertension inferior wall myocardial infarction felt to be secondary to a thrombus from atrial fib in October 2007 at [facility],  episodes of chest pain  Ejection fraction 30 to 35%, moderately elevated right heart pressures, July 16, 2020 following COVID Medication intolerances EF 30 to 35% on echo 05/18/21 who presents for followup Of her coronary artery disease and leg edema., persistent atrial fibrillation  Last seen by myself May 2023 Feels well today Stopped metoprolol, had dizzy spell/near syncope Taking torsemide 40 daily, was skipping dose here and there Seen by CHF clinic yesterday, REDS 46%. It was recommended she not skip torsemide daily It was recommended she take metolazone 2.5 mg + extra 40 KCL, taking it 2x a week  Weight in office 202 At home weight is 198  Reports that she feels well this month, active, going shopping, denies significant leg swelling Denies any PND orthopnea  Discussion concerning her warfarin, often subtherapeutic   Prior imaging reviewed Echocardiogram April 19 with ejection fraction 30 to 35% Moderate to severely elevated right heart pressures Severe biatrial enlargement moderate to severe MR No dramatic changes compared to study dated June 22  Previous medication intolerances to [facility], carvedilol On today's visit reports that unable to tolerate [place], makes her sneeze, cough 30 minutes after taking it  with a runny nose and eye watering  Previous attempt to use Carvedilol 3.25 twice daily started, Entresto 24/26 unsuccessful secondary to intolerance Went back on lisinopril, stopped the coreg  COVID infection early 2022  Does not use her lymphedema compression pumps  HBA1C 9.1 on labs 12/2018 HBA1C 8.3 on 05/20/2019  PMH:   has a past medical history of Acute myocardial infarction of inferior wall ([place]), Arthritis, Chronic anticoagulation, Chronic atrial fibrillation ([place]), Chronic combined systolic and diastolic CHF (congestive heart failure) ([place]), Essential hypertension, Hyperlipidemia, mixed, Mixed Ischemic/Nonischemic Cardiomyopathy, Nonobstructive coronary artery disease, Obesity, Thyroid disease, Type II diabetes mellitus ([place]), Venous insufficiency, and Vitamin D deficiency.  PSH:    Past Surgical History:  Procedure Laterality Date   CARDIAC CATHETERIZATION  11/2012   [facility];EF 45-50%   CHOLECYSTECTOMY  1999   TUBAL LIGATION  1968   VESICOVAGINAL FISTULA CLOSURE W/ TAH  1996    Current Outpatient Medications  Medication Sig Dispense Refill   ACCU-CHEK AVIVA PLUS test strip USE WITH METER TO CHECK GLUCOSE BEFORE MEALS AND AT BEDTIME 200 strip 12   Accu-Chek Softclix Lancets lancets SMARTSIG:Topical     albuterol (VENTOLIN HFA) 108 (90 Base) MCG/ACT inhaler TAKE 2 PUFFS BY MOUTH EVERY 6 HOURS AS NEEDED FOR WHEEZE OR SHORTNESS OF BREATH 8.5 each 2   ezetimibe (ZETIA) 10 MG tablet Take 1 tablet (10 mg total) by mouth daily. 90 tablet 2   flavoxATE (URISPAS) 100 MG tablet Take 1 tablet (100 mg total) by mouth 2 (two) times daily as needed for bladder spasms. 60 tablet 11   fluticasone (FLONASE) 50 MCG/ACT  nasal spray Place 2 sprays into both nostrils daily. 48 mL 3   HYDROcodone-acetaminophen (NORCO) 10-325 MG tablet Take 1 tablet by mouth every 8 (eight) hours as needed for severe pain. Must last 30 days. 60 tablet 0   insulin glargine (LANTUS SOLOSTAR) 100 UNIT/ML Solostar Pen  Inject 26 Units into the skin at bedtime.     insulin lispro (HUMALOG) 100 UNIT/ML KwikPen Inject 10-25 Units into the skin in the morning, at noon, and at bedtime.     Insulin Pen Needle (RELION PEN NEEDLE 31G/8MM) 31G X 8 MM MISC Use with insulin pen three times daily 100 each 12   levothyroxine (SYNTHROID) 88 MCG tablet Take 50 mcg by mouth daily.     lisinopril (ZESTRIL) 10 MG tablet Take 1 tablet (10 mg total) by mouth daily. 90 tablet 3   LORazepam (ATIVAN) 1 MG tablet TAKE 1 TABLET(1 MG) BY MOUTH TWICE DAILY AS NEEDED 60 tablet 1   Magnesium 500 MG CAPS Take 1,000 mg by mouth daily.      metolazone (ZAROXOLYN) 2.5 MG tablet Take 1 tablet (2.5 mg total) by mouth as directed. Take two times a week with torsemide 40 mg 90 tablet 1   nitroGLYCERIN (NITROSTAT) 0.4 MG SL tablet Place 1 tablet (0.4 mg total) under the tongue every 5 (five) minutes as needed for chest pain. 25 tablet 1   NONFORMULARY OR COMPOUNDED ITEM See pharmacy note- Not to use on any open skin or wound. 120 each 3   ondansetron (ZOFRAN) 4 MG tablet Take 1 tablet (4 mg total) by mouth every 12 (twelve) hours as needed for nausea or vomiting. 60 tablet 2   pantoprazole (PROTONIX) 40 MG tablet Take 1 tablet (40 mg total) by mouth daily. 30 tablet 11   potassium chloride SA (KLOR-CON) 20 MEQ tablet Take 1 tablet (20 mEq total) by mouth daily. 90 tablet 3   rOPINIRole (REQUIP) 0.25 MG tablet TAKE 1 TABLET BY MOUTH EVERYDAY AT BEDTIME 90 tablet 1   torsemide (DEMADEX) 20 MG tablet Take 2 tablets (40 mg total) by mouth daily. 60 tablet 6   warfarin (COUMADIN) 4 MG tablet Take daily or as directed by physician 90 tablet 4   HYDROcodone-acetaminophen (NORCO) 10-325 MG tablet Take 1 tablet by mouth every 8 (eight) hours as needed for severe pain. Must last 30 days. (Patient not taking: Reported on 09/20/2021) 60 tablet 0   [START ON 09/28/2021] HYDROcodone-acetaminophen (NORCO) 10-325 MG tablet Take 1 tablet by mouth every 8 (eight) hours  as needed for severe pain. Must last 30 days. (Patient not taking: Reported on 09/20/2021) 60 tablet 0   No current facility-administered medications for this visit.    Allergies:   Duloxetine, Other, Paroxetine, Sulfa antibiotics, Cephalexin, Clarithromycin, Diphenhydramine, Estrogens conjugated, Estrogens conjugated, Sulfonamide derivatives, and Nitrofurantoin   Social History:  The patient  reports that she has never smoked. She has never used smokeless tobacco. She reports that she does not drink alcohol and does not use drugs.   Family History:   family history includes Heart disease in her mother; Thyroid disease in her father.    Review of Systems: Review of Systems  Constitutional: Negative.   HENT: Negative.    Respiratory: Negative.    Cardiovascular: Negative.   Gastrointestinal: Negative.   Musculoskeletal: Negative.        Leg weakness  Neurological: Negative.   Psychiatric/Behavioral: Negative.    All other systems reviewed and are negative.   PHYSICAL EXAM: VS:  BP 100/60 (BP Location: Left Arm, Patient Position: Sitting, Cuff Size: Normal)   Pulse 72   Ht 5' 5.5" (1.664 m)   Wt 202 lb 4 oz (91.7 kg)   BMI 33.14 kg/m  , BMI Body mass index is 33.14 kg/m. Constitutional:  oriented to person, place, and time. No distress.  HENT:  Head: Grossly normal Eyes:  no discharge. No scleral icterus.  Neck: No JVD, no carotid bruits  Cardiovascular: Irregularly irregular, no murmurs appreciated Pulmonary/Chest: Clear to auscultation bilaterally, no wheezes or rails Abdominal: Soft.  no distension.  no tenderness.  Musculoskeletal: Normal range of motion Neurological:  normal muscle tone. Coordination normal. No atrophy Skin: Skin warm and dry Psychiatric: normal affect, pleasant   Recent Labs: 06/14/2021: Hemoglobin 13.5; Platelets 181 06/21/2021: ALT 10; NT-Pro BNP 1,723 09/19/2021: B Natriuretic Peptide 83.4; BUN 45; Creatinine, Ser 1.41; Potassium 5.0; Sodium 138     Lipid Panel Wt Readings from Last 3 Encounters:  09/20/21 202 lb 4 oz (91.7 kg)  09/19/21 200 lb 12.8 oz (91.1 kg)  08/16/21 193 lb (87.5 kg)     ASSESSMENT AND PLAN:  Acute on chronic diastolic and systolic CHF Appears euvolemic, weight stable 202 pounds, low end of her range At home weight 198 We have recommended she stay on torsemide 40 daily with potassium 20 Metolazone 2.5 mg twice a week, and she only take this with potassium 20 given potassium running at the high end of range Numerous medication intolerances, recently stopped metoprolol for near-syncope She is tolerating lisinopril 10 with her diuretic as above Creatinine today high end of her range We recommend she hold metolazone for weight less than 195  Cardiomyopathy Last evaluation April 2023 ejection fraction 30 to 35% Previous attempts at goal-directed medical therapy unsuccessful secondary to medication intolerances did not tolerate Farxiga, has not tolerated Entresto, spironolactone, Coreg No medication changes made on today's visit Recommended medication compliance with her diuretics as above Currently reports feeling well, active, going shopping  Mixed  hyperlipidemia Continue Zetia Statin intolerance, no changes made  Essential hypertension  Lisinopril up to 10 daily  Blood pressure running low on today's visit No changes made,  She recently stopped metoprolol for near-syncope  Chronic atrial fibrillation (HCC)  A. fib  contributing to CHF On warfarin,  Previously on metoprolol succinate 25 daily, she stopped this on her own, remains in A-fib rate in the 70s  Obstructive sleep apnea Previously reported this was "not a major issue" Currently not on CPAP Recommended low carbohydrate diet  Type 2 diabetes mellitus without complication, with long-term current use of insulin ([place]) We have encouraged continued exercise, careful diet management in an effort to lose weight.    Total encounter time  more than 30 minutes  Greater than 50% was spent in counseling and coordination of care with the patient   Orders Placed This Encounter  Procedures   EKG 12-Lead      Signed, Esmond Plants, M.D., Ph.D. 09/20/2021  [place], [place]

## 2021-09-19 NOTE — Progress Notes (Signed)
ADVANCED HF CLINIC NOTE   Primary Care: Jerrol Banana., MD Primary Cardiologist: Ida Rogue, MD HF Cardiologist: Dr. Vaughan Browner  HPI:  Tracy Mann is an 88 y.o. woman with morbid obesity, DM2, chronic AF, previous embolic IMI (2423), OCA (intolerant of CPAP) and systolic HF with EF 53-61% due to mixed iCM/NICM. She was referred by Dr. Rockey Situ for further evaluation of her HF and pulmonary HTN.  Apparently had IMI in 2007 which was felt to be due to embolic event from AF.   Last cath 2014 minimal CAD. EF 45-50% with AK/aneurysm of distal/apical inferior wall  Echo 1/20 EF 40-45%  Echo 6/22 EF 30-35% moderate MR  Myoview 6/22 EF 39% small infero-apical scar  Echo 4/23 EF 30-35% RV low normal moderate to severe MR, mod TR  RVSP 71mHG   Has been followed closely by Dr. GRockey Situfor her HF. Management has been complicated by a high fluid intake and intolerances to almost all GDMT including carvedilol, FWilder Glade Entresto and spiro.   Saw Dr. BHaroldine Lawsfor the first time 5/23, NYHA III-IIIb, volume mildly up and torsemide increased and continued on metolazone 2x/week.   Today she returns for HF follow up. Overall feeling much better. She can now go grocery shopping w/o dyspnea, has mild SOB if she pushes herself. Continues with mild swelling in left ankle. She stopped her Toprol on her own after an episode of "not feeling well". Misses doses of torsemide during the week.  Denies palpitations, abnormal bleeding, CP, dizziness, or PND/Orthopnea. Appetite ok. No fever or chills. Weight at home 197 pounds. Lives alone. [place] 1 daughter [place] Drinks a lot of fluid during the day.  ROS: All systems reviewed and negative except as per HPI.   Past Medical History:  Diagnosis Date   Acute myocardial infarction of inferior wall ([place]    a. 10/2005 - Presumed to be 2/2 to a coronary embolus in setting of persistent Afib-->Cath 2014 relatively nl cors,  EF 45-50% w/ severe distal inferior/apical inferior HK w/ aneursymal appearance.   Arthritis    Chronic anticoagulation    a. warfarin.   Chronic atrial fibrillation (HCC)    a. CHA2DS2VASc = 6--> warfarin.   Chronic combined systolic and diastolic CHF (congestive heart failure) ([place]    a. 10/2012 Echo: EF 35-40%, diff HK, mild MR, mild biatrial enlargement;  b. 11/2012 EF 45-50% by LV gram; c. echo 07/2014: EF 40-45%, mod conc LVH, mod MR, severe biatrial enlargement, PASP 45 mm Hg c. Echo 01/2018 EF 45-50%, duffuse hypokinesis, mild MR, LA mod dilated, norm RVSF, dilated IVC   Essential hypertension    Hyperlipidemia, mixed    Mixed Ischemic/Nonischemic Cardiomyopathy    a. 10/2012 Echo: EF 35-40%;  b. 11/2012 EF 45-50% by LV gram.   Nonobstructive coronary artery disease    a. 2007 inferior wall MI thought to be secondary to thrombus from atrial fib b. 2014 cath with R dominant coronary arterial system with mild luminal irregularities c. Myoview 05/2015 old inferior MI, no new ischemic changes   Obesity    Thyroid disease    hypothyroidism   Type II diabetes mellitus ([place]    a. 07/2018 A1c 8.7 - long term insulin use   Venous insufficiency    Vitamin D deficiency    Current Outpatient Medications  Medication Sig Dispense Refill   ACCU-CHEK AVIVA PLUS test strip USE WITH METER TO CHECK GLUCOSE BEFORE MEALS AND  AT BEDTIME 200 strip 12   Accu-Chek Softclix Lancets lancets SMARTSIG:Topical     albuterol (VENTOLIN HFA) 108 (90 Base) MCG/ACT inhaler TAKE 2 PUFFS BY MOUTH EVERY 6 HOURS AS NEEDED FOR WHEEZE OR SHORTNESS OF BREATH 8.5 each 2   ezetimibe (ZETIA) 10 MG tablet Take 1 tablet (10 mg total) by mouth daily. 90 tablet 2   flavoxATE (URISPAS) 100 MG tablet Take 1 tablet (100 mg total) by mouth 2 (two) times daily as needed for bladder spasms. 60 tablet 11   fluticasone (FLONASE) 50 MCG/ACT nasal spray Place 2 sprays into both nostrils daily. 48 mL 3   HYDROcodone-acetaminophen (NORCO)  10-325 MG tablet Take 1 tablet by mouth every 8 (eight) hours as needed for severe pain. Must last 30 days. 60 tablet 0   HYDROcodone-acetaminophen (NORCO) 10-325 MG tablet Take 1 tablet by mouth every 8 (eight) hours as needed for severe pain. Must last 30 days. 60 tablet 0   [START ON 09/28/2021] HYDROcodone-acetaminophen (NORCO) 10-325 MG tablet Take 1 tablet by mouth every 8 (eight) hours as needed for severe pain. Must last 30 days. 60 tablet 0   insulin glargine (LANTUS SOLOSTAR) 100 UNIT/ML Solostar Pen Inject 26 Units into the skin at bedtime.     insulin lispro (HUMALOG) 100 UNIT/ML KwikPen Inject 10-25 Units into the skin in the morning, at noon, and at bedtime.     Insulin Pen Needle (RELION PEN NEEDLE 31G/8MM) 31G X 8 MM MISC Use with insulin pen three times daily 100 each 12   levothyroxine (SYNTHROID) 88 MCG tablet Take 88 mcg by mouth daily.     lisinopril (ZESTRIL) 10 MG tablet Take 1 tablet (10 mg total) by mouth daily. 90 tablet 3   LORazepam (ATIVAN) 1 MG tablet TAKE 1 TABLET(1 MG) BY MOUTH TWICE DAILY AS NEEDED 60 tablet 1   Magnesium 500 MG CAPS Take 1,000 mg by mouth daily.      metolazone (ZAROXOLYN) 2.5 MG tablet Take 1 tablet (2.5 mg total) by mouth as directed. Take two times a week with torsemide 40 mg 90 tablet 1   nitroGLYCERIN (NITROSTAT) 0.4 MG SL tablet Place 1 tablet (0.4 mg total) under the tongue every 5 (five) minutes as needed for chest pain. 25 tablet 1   NONFORMULARY OR COMPOUNDED ITEM See pharmacy note- Not to use on any open skin or wound. 120 each 3   ondansetron (ZOFRAN) 4 MG tablet Take 1 tablet (4 mg total) by mouth every 12 (twelve) hours as needed for nausea or vomiting. 60 tablet 2   pantoprazole (PROTONIX) 40 MG tablet Take 1 tablet (40 mg total) by mouth daily. (Patient taking differently: Take 40 mg by mouth daily as needed.) 30 tablet 11   potassium chloride SA (KLOR-CON) 20 MEQ tablet Take 1 tablet (20 mEq total) by mouth daily. 90 tablet 3    rOPINIRole (REQUIP) 0.25 MG tablet TAKE 1 TABLET BY MOUTH EVERYDAY AT BEDTIME 90 tablet 1   torsemide (DEMADEX) 20 MG tablet Take 2 tablets (40 mg total) by mouth daily. (Patient taking differently: Take 30 mg by mouth daily.) 60 tablet 6   warfarin (COUMADIN) 4 MG tablet Take daily or as directed by physician 90 tablet 4   No current facility-administered medications for this encounter.   Allergies  Allergen Reactions   Duloxetine Other (See Comments)    Same as Paroxetine   Other Anaphylaxis and Swelling    'tongue swelling'   Paroxetine Other (See Comments)    "  Horrible headache," Stomach pain, Diarrhea   Sulfa Antibiotics Anaphylaxis   Cephalexin     Blisters   Clarithromycin Other (See Comments)    Hives, headaches, hard time swelling, felt like throat was closing up Hives, headaches, hard time swelling, felt like throat was closing up   Diphenhydramine Other (See Comments)   Estrogens Conjugated    Estrogens Conjugated Other (See Comments)   Sulfonamide Derivatives Swelling    'tongue swelling'   Nitrofurantoin Nausea Only   Social History   Socioeconomic History   Marital status: Widowed    Spouse name: widowed   Number of children: 4   Years of education: 14   Highest education level: 12th grade  Occupational History   Occupation: retired  Tobacco Use   Smoking status: Never   Smokeless tobacco: Never  Vaping Use   Vaping Use: Never used  Substance and Sexual Activity   Alcohol use: No   Drug use: No   Sexual activity: Never  Other Topics Concern   Not on file  Social History Narrative   Widowed   Has children and grandchildren   Lives in [place] Strain: [place]  (03/14/2017)   Overall Financial Resource Strain (CARDIA)    Difficulty of Paying Living Expenses: Not hard at all  Food Insecurity: No [place] (03/14/2017)   Hunger Vital Sign    Worried About Running Out of Food in the Last  Year: Never true    [place] in the Last Year: Never true  Transportation Needs: No Transportation Needs (03/14/2017)   PRAPARE - Hydrologist (Medical): No    Lack of Transportation (Non-Medical): No  Physical Activity: Not on file  Stress: No Stress Concern Present (03/14/2017)   [place]    Feeling of Stress : Not at all  Social Connections: Not on file  Intimate Partner Violence: Not on file   Family History  Problem Relation Age of Onset   Heart disease Mother    Thyroid disease Father    BP 112/70   Pulse 86   Wt 91.1 kg (200 lb 12.8 oz)   SpO2 92%   BMI 32.41 kg/m   Wt Readings from Last 3 Encounters:  09/19/21 91.1 kg (200 lb 12.8 oz)  08/16/21 87.5 kg (193 lb)  08/04/21 87.5 kg (193 lb)   PHYSICAL EXAM: General:  NAD. No resp difficulty, walked into clinic with rolling walker. HEENT: Normal Neck: Supple. JVP 7-8. Carotids 2+ bilat; no bruits. No lymphadenopathy or thryomegaly appreciated. Cor: PMI nondisplaced. Irregular rate & rhythm. No rubs, gallops; 2/6 MR Lungs: Clear Abdomen: Obese, soft, nontender, nondistended. No hepatosplenomegaly. No bruits or masses. Good bowel sounds. Extremities: No cyanosis, clubbing, rash, 2+ BLE edema to knees. Neuro: Alert & oriented x 3, cranial nerves grossly intact. Moves all 4 extremities w/o difficulty. Affect pleasant.  REDs: 46%  ASSESSMENT & PLAN: 1. Chronic systolic/diastolic HF due to mixed iCM/NICM  - h/o embolic IMI in 0981 - Cath 2014 no CAD - Echo (1/20): EF 40-45% - Myoview (6/22): EF 39% small infero-apical scar - Echo (4/23): EF 30-35%  RV low normal moderate to severe MR, mod TR  RVSP 56 mm HG  - Echo suggestive of restrictive physiology.  - Improved NYHA II-early III, despite being volume up today. Drinks a lot of fluid, REDS 46%. - Discussed cutting back on  fluid intake. - Take metolazone 2.5 mg + extra  40 KCL today and tomorrow, then PRN thereafter. - Continue torsemide 40 mg daily. Stressed importance of taking daily. - Continue lisinopril 10 mg daily. - She stopped Toprol on her own.  - Management c/b intolerances to almost all GDMT including carvedilol, Farxiga (severe sneezing), Entresto (dizziness) and spiro. - Place compression hose.  - Labs today. BMET in 10 days.  2. Pulmonary HTN by echo - Echo 4/23: RVSP 30mHG. RV dilated Function low normal. - Likely WHO Group 2 & 3. - Continue to manag.e fluid status closely. - Intolerant of CPAP - Do not think RHC will help much in management at this juncture.  3. Severe MR - Functional. Likely atrial in nature. - Dr. BHaroldine Lawsdiscussed TEE with her to evaluate MV further for possible mTEER to help with volume management.   4. Chronic AF - Rate controlled on warfarin.   5. Morbid obesity - Body mass index is 32.41 kg/m. - Consider GLP1RA.  Follow up in 3-4 months with Dr. BHaroldine Laws  JRafael Bihari FNP  2:05 PM

## 2021-09-20 ENCOUNTER — Ambulatory Visit: Payer: Medicare Other | Admitting: Cardiovascular Disease

## 2021-09-20 ENCOUNTER — Encounter: Payer: Self-pay | Admitting: Cardiovascular Disease

## 2021-09-20 VITALS — BP 100/60 | HR 72 | Ht 65.5 in | Wt 202.2 lb

## 2021-09-20 DIAGNOSIS — R55 Syncope and collapse: Secondary | ICD-10-CM

## 2021-09-20 DIAGNOSIS — Z789 Other specified health status: Secondary | ICD-10-CM

## 2021-09-20 DIAGNOSIS — I25118 Atherosclerotic heart disease of native coronary artery with other forms of angina pectoris: Secondary | ICD-10-CM | POA: Diagnosis not present

## 2021-09-20 DIAGNOSIS — G4733 Obstructive sleep apnea (adult) (pediatric): Secondary | ICD-10-CM

## 2021-09-20 DIAGNOSIS — E782 Mixed hyperlipidemia: Secondary | ICD-10-CM

## 2021-09-20 DIAGNOSIS — I340 Nonrheumatic mitral (valve) insufficiency: Secondary | ICD-10-CM

## 2021-09-20 DIAGNOSIS — I5042 Chronic combined systolic (congestive) and diastolic (congestive) heart failure: Secondary | ICD-10-CM

## 2021-09-20 DIAGNOSIS — R0789 Other chest pain: Secondary | ICD-10-CM

## 2021-09-20 DIAGNOSIS — I482 Chronic atrial fibrillation, unspecified: Secondary | ICD-10-CM

## 2021-09-20 DIAGNOSIS — R0609 Other forms of dyspnea: Secondary | ICD-10-CM

## 2021-09-20 NOTE — Patient Instructions (Addendum)
Move CHF clinic in [place] out for 6 months (please call [phone] to reschedule this appointment)  Medication Instructions:  Do not take metolazone for weight <195  Stay on torsemide 40 daily potassium 20 daily  Metolazone 2.5 mg twice a week with extra 20 potassium when you take metolazone   Read about eliquis instead of warfarin If you need a refill on your cardiac medications before your next appointment, please call your pharmacy.    Lab work: No new labs needed   Testing/Procedures: No new testing needed   Follow-Up: At [facility], you and your health needs are our priority.  As part of our continuing mission to provide you with exceptional heart care, we have created designated Provider Care Teams.  These Care Teams include your primary Cardiologist (physician) and Advanced Practice Providers (APPs -  Physician Assistants and Nurse Practitioners) who all work together to provide you with the care you need, when you need it.  You will need a follow up appointment in 3 months  Providers on your designated Care Team:   Murray Hodgkins, NP Christell Faith, PA-C Cadence Kathlen Mody, [place]  COVID-19 Vaccine Information can be found at: [email or web address] For questions related to vaccine distribution or appointments, please email [email or web address] or call [phone].

## 2021-09-28 ENCOUNTER — Ambulatory Visit: Payer: Medicare Other

## 2021-09-28 DIAGNOSIS — I482 Chronic atrial fibrillation, unspecified: Secondary | ICD-10-CM | POA: Diagnosis not present

## 2021-09-28 LAB — POCT INR
INR: 1.8 — AB (ref 2.0–3.0)
PT: 21.5

## 2021-09-28 NOTE — Patient Instructions (Signed)
Description   Take 4 mg daily except 3 mg on F.  Recheck in 3 weeks.     

## 2021-09-29 ENCOUNTER — Encounter (HOSPITAL_COMMUNITY): Payer: Medicare Other

## 2021-10-06 ENCOUNTER — Encounter: Payer: Self-pay | Admitting: Family Medicine

## 2021-10-06 ENCOUNTER — Ambulatory Visit: Payer: Medicare Other | Admitting: Family Medicine

## 2021-10-06 VITALS — BP 133/53 | HR 82 | Resp 16 | Wt 204.0 lb

## 2021-10-06 DIAGNOSIS — G4733 Obstructive sleep apnea (adult) (pediatric): Secondary | ICD-10-CM

## 2021-10-06 DIAGNOSIS — G894 Chronic pain syndrome: Secondary | ICD-10-CM

## 2021-10-06 DIAGNOSIS — Z794 Long term (current) use of insulin: Secondary | ICD-10-CM

## 2021-10-06 DIAGNOSIS — I872 Venous insufficiency (chronic) (peripheral): Secondary | ICD-10-CM

## 2021-10-06 DIAGNOSIS — N189 Chronic kidney disease, unspecified: Secondary | ICD-10-CM

## 2021-10-06 DIAGNOSIS — I10 Essential (primary) hypertension: Secondary | ICD-10-CM

## 2021-10-06 DIAGNOSIS — E1159 Type 2 diabetes mellitus with other circulatory complications: Secondary | ICD-10-CM

## 2021-10-06 DIAGNOSIS — I428 Other cardiomyopathies: Secondary | ICD-10-CM

## 2021-10-06 DIAGNOSIS — I482 Chronic atrial fibrillation, unspecified: Secondary | ICD-10-CM

## 2021-10-06 DIAGNOSIS — I509 Heart failure, unspecified: Secondary | ICD-10-CM

## 2021-10-06 MED ORDER — LORAZEPAM 1 MG PO TABS
ORAL_TABLET | ORAL | 5 refills | Status: AC
Start: 2021-10-06 — End: ?

## 2021-10-06 MED ORDER — NONFORMULARY OR COMPOUNDED ITEM: 3 refills | Status: DC

## 2021-10-06 NOTE — Progress Notes (Unsigned)
Established patient visit  I,April Miller,acting as a scribe for Wilhemena Durie, MD.,have documented all relevant documentation on the behalf of Wilhemena Durie, MD,as directed by  Wilhemena Durie, MD while in the presence of Wilhemena Durie, MD.   Patient: Tracy Mann   DOB: October 29, 1933   88 y.o. Female  MRN: [id number] Visit Date: 10/06/2021  Today's healthcare provider: Wilhemena Durie, MD   Chief Complaint  Patient presents with   Follow-up   Subjective    HPI  Patient is here to follow up on chronic problems.  Medications: Outpatient Medications Prior to Visit  Medication Sig   ACCU-CHEK AVIVA PLUS test strip USE WITH METER TO CHECK GLUCOSE BEFORE MEALS AND AT BEDTIME   Accu-Chek Softclix Lancets lancets SMARTSIG:Topical   albuterol (VENTOLIN HFA) 108 (90 Base) MCG/ACT inhaler TAKE 2 PUFFS BY MOUTH EVERY 6 HOURS AS NEEDED FOR WHEEZE OR SHORTNESS OF BREATH   ezetimibe (ZETIA) 10 MG tablet Take 1 tablet (10 mg total) by mouth daily.   flavoxATE (URISPAS) 100 MG tablet Take 1 tablet (100 mg total) by mouth 2 (two) times daily as needed for bladder spasms.   fluticasone (FLONASE) 50 MCG/ACT nasal spray Place 2 sprays into both nostrils daily.   HYDROcodone-acetaminophen (NORCO) 10-325 MG tablet Take 1 tablet by mouth every 8 (eight) hours as needed for severe pain. Must last 30 days.   insulin glargine (LANTUS SOLOSTAR) 100 UNIT/ML Solostar Pen Inject 26 Units into the skin at bedtime.   insulin lispro (HUMALOG) 100 UNIT/ML KwikPen Inject 10-25 Units into the skin in the morning, at noon, and at bedtime.   Insulin Pen Needle (RELION PEN NEEDLE 31G/8MM) 31G X 8 MM MISC Use with insulin pen three times daily   lisinopril (ZESTRIL) 10 MG tablet Take 1 tablet (10 mg total) by mouth daily.   LORazepam (ATIVAN) 1 MG tablet TAKE 1 TABLET(1 MG) BY MOUTH TWICE DAILY AS NEEDED   Magnesium 500 MG CAPS Take 1,000 mg by mouth daily.    metolazone (ZAROXOLYN) 2.5 MG  tablet Take 1 tablet (2.5 mg total) by mouth as directed. Take two times a week with torsemide 40 mg   nitroGLYCERIN (NITROSTAT) 0.4 MG SL tablet Place 1 tablet (0.4 mg total) under the tongue every 5 (five) minutes as needed for chest pain.   NONFORMULARY OR COMPOUNDED ITEM See pharmacy note- Not to use on any open skin or wound.   ondansetron (ZOFRAN) 4 MG tablet Take 1 tablet (4 mg total) by mouth every 12 (twelve) hours as needed for nausea or vomiting.   pantoprazole (PROTONIX) 40 MG tablet Take 1 tablet (40 mg total) by mouth daily.   potassium chloride SA (KLOR-CON) 20 MEQ tablet Take 1 tablet (20 mEq total) by mouth daily.   rOPINIRole (REQUIP) 0.25 MG tablet TAKE 1 TABLET BY MOUTH EVERYDAY AT BEDTIME   torsemide (DEMADEX) 20 MG tablet Take 2 tablets (40 mg total) by mouth daily.   warfarin (COUMADIN) 4 MG tablet Take daily or as directed by physician   [DISCONTINUED] levothyroxine (SYNTHROID) 88 MCG tablet Take 50 mcg by mouth daily.   HYDROcodone-acetaminophen (NORCO) 10-325 MG tablet Take 1 tablet by mouth every 8 (eight) hours as needed for severe pain. Must last 30 days.   HYDROcodone-acetaminophen (NORCO) 10-325 MG tablet Take 1 tablet by mouth every 8 (eight) hours as needed for severe pain. Must last 30 days. (Patient not taking: Reported on 09/20/2021)   levothyroxine (SYNTHROID)  50 MCG tablet Take 50 mcg by mouth every morning.   No facility-administered medications prior to visit.    Review of Systems  {Labs  Heme  Chem  Endocrine  Serology  Results Review (optional):23779}   Objective    There were no vitals taken for this visit. {Show previous vital signs (optional):23777}  Physical Exam  ***  No results found for any visits on 10/06/21.  Assessment & Plan     ***  No follow-ups on file.      {provider attestation***:1}   Wilhemena Durie, MD  [facility] [phone] (phone) [phone] (fax)  [place]

## 2021-10-13 ENCOUNTER — Encounter: Payer: Self-pay | Admitting: Physician Assistant

## 2021-10-13 ENCOUNTER — Ambulatory Visit: Payer: Medicare Other | Admitting: Physician Assistant

## 2021-10-13 VITALS — BP 116/57 | HR 97 | Temp 98.1°F | Resp 16 | Wt 204.4 lb

## 2021-10-13 DIAGNOSIS — R300 Dysuria: Secondary | ICD-10-CM

## 2021-10-13 LAB — POCT URINALYSIS DIPSTICK
Bilirubin, UA: NEGATIVE
Blood, UA: NEGATIVE
Glucose, UA: NEGATIVE
Ketones, UA: NEGATIVE
Nitrite, UA: NEGATIVE
Protein, UA: NEGATIVE
Spec Grav, UA: <=1.005 — AB (ref 1.010–1.025)
Urobilinogen, UA: 0.2 E.U./dL
pH, UA: 7.5 (ref 5.0–8.0)

## 2021-10-13 NOTE — Progress Notes (Signed)
I,Tracy Mann,acting as a Education administrator for [employer], PA-C.,have documented all relevant documentation on the behalf of Tracy Speak, PA-C,as directed by  [employer], PA-C while in the presence of [employer], PA-C.     Established patient visit   Patient: Tracy Mann   DOB: 1933-03-12   88 y.o. Female  MRN: [id number] Visit Date: 10/13/2021  Today's healthcare provider: [name], PA-C   Chief Complaint  Patient presents with  . Urinary Tract Infection   Subjective    HPI  Urinary symptoms  She reports new onset flank pain and urinary frequency. The current episode started a few days ago and is worsening. Patient states symptoms are moderate in intensity, occurring constantly. She  has been recently treated for similar symptoms. Patient reports she is taking nitrofurantoin that was prescribed by urologist. She is also taking AZO to help with symptoms. She is requesting levofloxacin.    Associated symptoms: Yes abdominal pain Yes back pain  Yes chills No constipation  Yes cramping No diarrhea  No discharge No fever  No hematuria No nausea  No vomiting    ---------------------------------------------------------------------------------------   Medications: Outpatient Medications Prior to Visit  Medication Sig  . ACCU-CHEK AVIVA PLUS test strip USE WITH METER TO CHECK GLUCOSE BEFORE MEALS AND AT BEDTIME  . Accu-Chek Softclix Lancets lancets SMARTSIG:Topical  . albuterol (VENTOLIN HFA) 108 (90 Base) MCG/ACT inhaler TAKE 2 PUFFS BY MOUTH EVERY 6 HOURS AS NEEDED FOR WHEEZE OR SHORTNESS OF BREATH  . ezetimibe (ZETIA) 10 MG tablet Take 1 tablet (10 mg total) by mouth daily.  . flavoxATE (URISPAS) 100 MG tablet Take 1 tablet (100 mg total) by mouth 2 (two) times daily as needed for bladder spasms.  . fluticasone (FLONASE) 50 MCG/ACT nasal spray Place 2 sprays into both nostrils daily.  Marland Kitchen HYDROcodone-acetaminophen (NORCO) 10-325 MG tablet Take 1 tablet by mouth  every 8 (eight) hours as needed for severe pain. Must last 30 days.  . insulin glargine (LANTUS SOLOSTAR) 100 UNIT/ML Solostar Pen Inject 26 Units into the skin at bedtime.  . insulin lispro (HUMALOG) 100 UNIT/ML KwikPen Inject 10-25 Units into the skin in the morning, at noon, and at bedtime.  . Insulin Pen Needle (RELION PEN NEEDLE 31G/8MM) 31G X 8 MM MISC Use with insulin pen three times daily  . levothyroxine (SYNTHROID) 50 MCG tablet Take 50 mcg by mouth every morning.  Marland Kitchen lisinopril (ZESTRIL) 10 MG tablet Take 1 tablet (10 mg total) by mouth daily.  Marland Kitchen LORazepam (ATIVAN) 1 MG tablet TAKE 1 TABLET(1 MG) BY MOUTH TWICE DAILY AS NEEDED  . Magnesium 500 MG CAPS Take 1,000 mg by mouth daily.   . metolazone (ZAROXOLYN) 2.5 MG tablet Take 1 tablet (2.5 mg total) by mouth as directed. Take two times a week with torsemide 40 mg  . nitroGLYCERIN (NITROSTAT) 0.4 MG SL tablet Place 1 tablet (0.4 mg total) under the tongue every 5 (five) minutes as needed for chest pain.  . NONFORMULARY OR COMPOUNDED ITEM See pharmacy note- Not to use on any open skin or wound.  . ondansetron (ZOFRAN) 4 MG tablet Take 1 tablet (4 mg total) by mouth every 12 (twelve) hours as needed for nausea or vomiting.  . pantoprazole (PROTONIX) 40 MG tablet Take 1 tablet (40 mg total) by mouth daily.  . potassium chloride SA (KLOR-CON) 20 MEQ tablet Take 1 tablet (20 mEq total) by mouth daily.  Marland Kitchen rOPINIRole (REQUIP) 0.25 MG tablet TAKE 1 TABLET BY MOUTH  EVERYDAY AT BEDTIME  . torsemide (DEMADEX) 20 MG tablet Take 2 tablets (40 mg total) by mouth daily.  Marland Kitchen warfarin (COUMADIN) 4 MG tablet Take daily or as directed by physician  . HYDROcodone-acetaminophen (NORCO) 10-325 MG tablet Take 1 tablet by mouth every 8 (eight) hours as needed for severe pain. Must last 30 days.  Marland Kitchen HYDROcodone-acetaminophen (NORCO) 10-325 MG tablet Take 1 tablet by mouth every 8 (eight) hours as needed for severe pain. Must last 30 days. (Patient not taking:  Reported on 09/20/2021)   No facility-administered medications prior to visit.    Review of Systems  Constitutional:  Positive for chills. Negative for fever.  Respiratory:  Negative for shortness of breath.   Gastrointestinal:  Positive for abdominal pain.  Genitourinary:  Positive for dysuria, flank pain and frequency. Negative for hematuria.        Objective    Wt 204 lb 6.4 oz (92.7 kg)   BMI 33.50 kg/m  BP Readings from Last 3 Encounters:  10/13/21 (!) 116/57  10/06/21 (!) 133/53  09/20/21 100/60   Wt Readings from Last 3 Encounters:  10/13/21 204 lb 6.4 oz (92.7 kg)  10/06/21 204 lb (92.5 kg)  09/20/21 202 lb 4 oz (91.7 kg)      Physical Exam Vitals reviewed.  Constitutional:      General: She is not in acute distress.    Appearance: She is well-developed.  HENT:     Head: Normocephalic and atraumatic.  Eyes:     General: No scleral icterus.    Conjunctiva/sclera: Conjunctivae normal.  Cardiovascular:     Rate and Rhythm: Normal rate and regular rhythm.     Heart sounds: Normal heart sounds. No murmur heard. Pulmonary:     Effort: Pulmonary effort is normal. No respiratory distress.     Breath sounds: Normal breath sounds. No wheezing or rales.  Abdominal:     General: There is no distension.     Palpations: Abdomen is soft.     Tenderness: There is abdominal tenderness in the suprapubic area. There is no guarding or rebound.  Skin:    General: Skin is warm and dry.     Capillary Refill: Capillary refill takes less than 2 seconds.     Findings: No rash.  Neurological:     Mental Status: She is alert and oriented to person, place, and time.  Psychiatric:        Behavior: Behavior normal.        Thought Content: Thought content normal.        Judgment: Judgment normal.      No results found for any visits on 10/13/21.  Assessment & Plan     1. Dysuria Recurrent. Last episode of cystitis in 03/2021 was treated with levofloxacin. Her ur cx showed  no growth. - POCT urinalysis dipstick and Urine culture Hx of interstitial cystitis Has been having trouble to produce a urine sample Considering reoccurrence of urinary symptoms, treatment with Nitrofurantoin for interstitial cystitis, we encouraged pt to wait until her ur cx results  The patient was advised to call back or seek an in-person evaluation if the symptoms worsen or if the condition fails to improve as anticipated.  I discussed the assessment and treatment plan with the patient. The patient was provided an opportunity to ask questions and all were answered. The patient agreed with the plan and demonstrated an understanding of the instructions.  The entirety of the information documented in the History of Present Illness,  Review of Systems and Physical Exam were personally obtained by me. Portions of this information were initially documented by the CMA and reviewed by me for thoroughness and accuracy.  Portions of this note were created using dictation software and may contain typographical errors.       {provider attestation***:1}   Tracy Mann, Hershal Coria  [facility] [phone] (phone) [phone] (fax)  [place]

## 2021-10-14 LAB — URINALYSIS, MICROSCOPIC ONLY
Bacteria, UA: NONE SEEN
Casts: NONE SEEN /lpf

## 2021-10-17 ENCOUNTER — Encounter: Payer: Self-pay | Admitting: Urology

## 2021-10-17 ENCOUNTER — Other Ambulatory Visit: Payer: Self-pay | Admitting: Physician Assistant

## 2021-10-17 ENCOUNTER — Ambulatory Visit (INDEPENDENT_AMBULATORY_CARE_PROVIDER_SITE_OTHER): Payer: Medicare Other | Admitting: Urology

## 2021-10-17 ENCOUNTER — Other Ambulatory Visit: Payer: Self-pay

## 2021-10-17 VITALS — BP 135/65 | HR 96 | Ht 65.0 in | Wt 204.0 lb

## 2021-10-17 DIAGNOSIS — N3020 Other chronic cystitis without hematuria: Secondary | ICD-10-CM | POA: Diagnosis not present

## 2021-10-17 DIAGNOSIS — N390 Urinary tract infection, site not specified: Secondary | ICD-10-CM

## 2021-10-17 DIAGNOSIS — R300 Dysuria: Secondary | ICD-10-CM

## 2021-10-17 LAB — URINE CULTURE

## 2021-10-17 MED ORDER — NITROFURANTOIN MACROCRYSTAL 100 MG PO CAPS: 100.0000 mg | ORAL_CAPSULE | Freq: Two times a day (BID) | ORAL | 0 refills | Status: AC

## 2021-10-17 MED ORDER — LEVOFLOXACIN 500 MG PO TABS: 500.0000 mg | ORAL_TABLET | Freq: Every day | ORAL | 0 refills | Status: DC

## 2021-10-17 MED ORDER — FLAVOXATE HCL 100 MG PO TABS: 100.0000 mg | ORAL_TABLET | Freq: Two times a day (BID) | ORAL | 11 refills | Status: AC | PRN

## 2021-10-17 NOTE — Progress Notes (Signed)
Pt is on warfarin. Choice of/Rx of abx will be left to discretion of Urology. Pt will have her fu appt with Dr. Rogue Bussing today. Last levofloxacin was Rx in 2022.

## 2021-10-17 NOTE — Progress Notes (Signed)
10/17/2021 11:04 AM   Tracy Mann Nov 28, 1933 [id number]  Referring provider: Jerrol Banana., MD [address] [place],  [place] [address]  Chief Complaint  Patient presents with   Follow-up    HPI: Patient has 20-year history of interstitial cystitis treated at [facility] by Dr. Jacqlyn Larsen.  Said she was stable on daily Macrodantin for years.  In the last year perhaps 3 times she has doubled up on the Macrodantin at home when she feels irritated.  She has just gone through 2 courses of Keflex that helped frequency and worsening burning.  She does not think she is infected now.   She has rare stress incontinence.  She normally voids every 1 or 2 hours gets up once at night   2 urine cultures positive for Klebsiella in the last several weeks.     Patient is having breakthrough bladder infections.  I would like to switch prophylaxis to trimethoprim.  Send urine for culture.  Call if positive.    When she takes her diuretic she has a lot of frequency.  Also it limits her ability to drink certain fluids affecting her quality life.  She wants to try the Myrbetriq 50 mg samples   I saw the patient in February 24, 2019.  She was treated for vaginitis.  She saw my partner in April 2021 for a solid mass 3.2 cm in the mid upper left kidney.  It was cleared by CT scan with renal cyst.  She may have had headaches with the trimethoprim.  She had a positive culture at that time.  She had a negative culture in August 2021   Patient doing well today.  When she gets a flareup of her interstitial cystitis she still says the Macrobid helps.  It causes some nausea but she wanted a prescription I gave her 30 tablets of 100 mg with 2 refills to self treat her self.  She describes rescue treatments at [facility] and she may have been put to sleep because the red painful.  She is not on Myrbetriq.  She is not on prophylaxis.  She thinks he had headaches with the trimethoprim    Today I have not seen the  patient for 1 year Saw a nurse practitioner many months ago and has a decreased GFR. In March and April 2023 she had a negative culture.  She had a positive culture in September 2023  She was having burning and suprapubic discomfort it sounds like he saw a nurse practitioner locally who culture today with a positive culture that I dictated above.  I called in ciprofloxacin which the patient likes.   PMH: Past Medical History:  Diagnosis Date   Acute myocardial infarction of inferior wall ([place])    a. 10/2005 - Presumed to be 2/2 to a coronary embolus in setting of persistent Afib-->Cath 2014 relatively nl cors, EF 45-50% w/ severe distal inferior/apical inferior HK w/ aneursymal appearance.   Arthritis    Chronic anticoagulation    a. warfarin.   Chronic atrial fibrillation (HCC)    a. CHA2DS2VASc = 6--> warfarin.   Chronic combined systolic and diastolic CHF (congestive heart failure) ([place])    a. 10/2012 Echo: EF 35-40%, diff HK, mild MR, mild biatrial enlargement;  b. 11/2012 EF 45-50% by LV gram; c. echo 07/2014: EF 40-45%, mod conc LVH, mod MR, severe biatrial enlargement, PASP 45 mm Hg c. Echo 01/2018 EF 45-50%, duffuse hypokinesis, mild MR, LA mod dilated, norm RVSF, dilated  IVC   Essential hypertension    Hyperlipidemia, mixed    Mixed Ischemic/Nonischemic Cardiomyopathy    a. 10/2012 Echo: EF 35-40%;  b. 11/2012 EF 45-50% by LV gram.   Nonobstructive coronary artery disease    a. 2007 inferior wall MI thought to be secondary to thrombus from atrial fib b. 2014 cath with R dominant coronary arterial system with mild luminal irregularities c. Myoview 05/2015 old inferior MI, no new ischemic changes   Obesity    Thyroid disease    hypothyroidism   Type II diabetes mellitus ([place])    a. 07/2018 A1c 8.7 - long term insulin use   Venous insufficiency    Vitamin D deficiency     Surgical History: Past Surgical History:  Procedure Laterality Date   CARDIAC CATHETERIZATION  11/2012    [facility];EF 45-50%   CHOLECYSTECTOMY  1999   TUBAL LIGATION  1968   VESICOVAGINAL FISTULA CLOSURE W/ TAH  1996    Home Medications:  Allergies as of 10/17/2021       Reactions   Duloxetine Other (See Comments)   Same as Paroxetine   Other Anaphylaxis, Swelling   'tongue swelling'   Paroxetine Other (See Comments)   "Horrible headache," Stomach pain, Diarrhea   Sulfa Antibiotics Anaphylaxis   Cephalexin    Blisters   Clarithromycin Other (See Comments)   Hives, headaches, hard time swelling, felt like throat was closing up Hives, headaches, hard time swelling, felt like throat was closing up   Diphenhydramine Other (See Comments)   Estrogens Conjugated    Estrogens Conjugated Other (See Comments)   Sulfonamide Derivatives Swelling   'tongue swelling'   Nitrofurantoin Nausea Only        Medication List        Accurate as of October 17, 2021 11:04 AM. If you have any questions, ask your nurse or doctor.          Accu-Chek Aviva Plus test strip Generic drug: glucose blood USE WITH METER TO CHECK GLUCOSE BEFORE MEALS AND AT BEDTIME   Accu-Chek Softclix Lancets lancets SMARTSIG:Topical   albuterol 108 (90 Base) MCG/ACT inhaler Commonly known as: VENTOLIN HFA TAKE 2 PUFFS BY MOUTH EVERY 6 HOURS AS NEEDED FOR WHEEZE OR SHORTNESS OF BREATH   ezetimibe 10 MG tablet Commonly known as: ZETIA Take 1 tablet (10 mg total) by mouth daily.   flavoxATE 100 MG tablet Commonly known as: URISPAS Take 1 tablet (100 mg total) by mouth 2 (two) times daily as needed for bladder spasms.   fluticasone 50 MCG/ACT nasal spray Commonly known as: FLONASE Place 2 sprays into both nostrils daily.   HYDROcodone-acetaminophen 10-325 MG tablet Commonly known as: Norco Take 1 tablet by mouth every 8 (eight) hours as needed for severe pain. Must last 30 days.   HYDROcodone-acetaminophen 10-325 MG tablet Commonly known as: Norco Take 1 tablet by mouth every 8 (eight) hours as needed for  severe pain. Must last 30 days.   HYDROcodone-acetaminophen 10-325 MG tablet Commonly known as: Norco Take 1 tablet by mouth every 8 (eight) hours as needed for severe pain. Must last 30 days.   insulin lispro 100 UNIT/ML KwikPen Commonly known as: HUMALOG Inject 10-25 Units into the skin in the morning, at noon, and at bedtime.   Lantus SoloStar 100 UNIT/ML Solostar Pen Generic drug: insulin glargine Inject 26 Units into the skin at bedtime.   levofloxacin 500 MG tablet Commonly known as: LEVAQUIN Take 1 tablet (500 mg total) by mouth daily. Started  by: Juluis Mire, CMA   levothyroxine 50 MCG tablet Commonly known as: SYNTHROID Take 50 mcg by mouth every morning.   lisinopril 10 MG tablet Commonly known as: ZESTRIL Take 1 tablet (10 mg total) by mouth daily.   LORazepam 1 MG tablet Commonly known as: ATIVAN TAKE 1 TABLET(1 MG) BY MOUTH TWICE DAILY AS NEEDED   Magnesium 500 MG Caps Take 1,000 mg by mouth daily.   metolazone 2.5 MG tablet Commonly known as: ZAROXOLYN Take 1 tablet (2.5 mg total) by mouth as directed. Take two times a week with torsemide 40 mg   nitroGLYCERIN 0.4 MG SL tablet Commonly known as: NITROSTAT Place 1 tablet (0.4 mg total) under the tongue every 5 (five) minutes as needed for chest pain.   NONFORMULARY OR COMPOUNDED ITEM See pharmacy note- Not to use on any open skin or wound.   ondansetron 4 MG tablet Commonly known as: Zofran Take 1 tablet (4 mg total) by mouth every 12 (twelve) hours as needed for nausea or vomiting.   pantoprazole 40 MG tablet Commonly known as: PROTONIX Take 1 tablet (40 mg total) by mouth daily.   potassium chloride SA 20 MEQ tablet Commonly known as: KLOR-CON M Take 1 tablet (20 mEq total) by mouth daily.   RELION PEN NEEDLE 31G/8MM 31G X 8 MM Misc Generic drug: Insulin Pen Needle Use with insulin pen three times daily   rOPINIRole 0.25 MG tablet Commonly known as: REQUIP TAKE 1 TABLET BY MOUTH  EVERYDAY AT BEDTIME   torsemide 20 MG tablet Commonly known as: DEMADEX Take 2 tablets (40 mg total) by mouth daily.   warfarin 4 MG tablet Commonly known as: COUMADIN Take as directed by the anticoagulation clinic. If you are unsure how to take this medication, talk to your nurse or doctor. Original instructions: Take daily or as directed by physician        Allergies:  Allergies  Allergen Reactions   Duloxetine Other (See Comments)    Same as Paroxetine   Other Anaphylaxis and Swelling    'tongue swelling'   Paroxetine Other (See Comments)    "Horrible headache," Stomach pain, Diarrhea   Sulfa Antibiotics Anaphylaxis   Cephalexin     Blisters   Clarithromycin Other (See Comments)    Hives, headaches, hard time swelling, felt like throat was closing up Hives, headaches, hard time swelling, felt like throat was closing up   Diphenhydramine Other (See Comments)   Estrogens Conjugated    Estrogens Conjugated Other (See Comments)   Sulfonamide Derivatives Swelling    'tongue swelling'   Nitrofurantoin Nausea Only    Family History: Family History  Problem Relation Age of Onset   Heart disease Mother    Thyroid disease Father     Social History:  reports that she has never smoked. She has never used smokeless tobacco. She reports that she does not drink alcohol and does not use drugs.  ROS:                                        Physical Exam: There were no vitals taken for this visit.  Constitutional:  Alert and oriented, No acute distress. HEENT: [place] AT, moist mucus membranes.  Trachea midline, no masses.   Laboratory Data: Lab Results  Component Value Date   WBC 8.1 06/14/2021   HGB 13.5 06/14/2021   HCT 40.9 06/14/2021  MCV 91 06/14/2021   PLT 181 06/14/2021    Lab Results  Component Value Date   CREATININE 1.41 (H) 09/19/2021    No results found for: "PSA"  No results found for: "TESTOSTERONE"  Lab Results  Component  Value Date   HGBA1C 8.2 (H) 12/31/2019    Urinalysis    Component Value Date/Time   APPEARANCEUR Clear 05/12/2021 0953   GLUCOSEU Negative 05/12/2021 0953   BILIRUBINUR Negative 10/13/2021 1329   BILIRUBINUR Negative 05/12/2021 0953   PROTEINUR Negative 10/13/2021 1329   PROTEINUR Negative 05/12/2021 0953   UROBILINOGEN 0.2 10/13/2021 1329   NITRITE Negative 10/13/2021 1329   NITRITE Negative 05/12/2021 0953   LEUKOCYTESUR Moderate (2+) (A) 10/13/2021 1329   LEUKOCYTESUR 1+ (A) 05/12/2021 0953    Pertinent Imaging:   Assessment & Plan: Patient gets flareups and sometimes cultures positive and other times negative.  This is a good antibiotic for her.  At her request I gave her Macrodantin 100 mg twice a day for 7 days with 1 refill that she can self treat her self I renewed her flavoxate She will take the Levaquin that was called in.  I will see in a year.  There are no diagnoses linked to this encounter.  No follow-ups on file.  Reece Packer, MD  [facility] [address], [place], [place] [address] [phone]

## 2021-10-17 NOTE — Progress Notes (Signed)
Your urine culture results are back and showed Klebsiella Pneumoniae. Nitrofurantoin might have only an intermediate effect on this bacteria. Rx to levofloxacin/ciprofloxacin will be placed , considering you might have allergies /contraindications for cephalexin and bactrim. Rx will be sent for 5 days unless Dr. Matilde Sprang will be considered this should be changed during your appt today.  Benefits and risks of taking this medication were discussed. Patient agreed and expressed his understanding.

## 2021-10-17 NOTE — Progress Notes (Unsigned)
PROVIDER NOTE: Information contained herein reflects review and annotations entered in association with encounter. Interpretation of such information and data should be left to medically-trained personnel. Information provided to patient can be located elsewhere in the medical record under "Patient Instructions". Document created using STT-dictation technology, any transcriptional errors that may result from process are unintentional.    Patient: Tracy Mann  Service Category: E/M  Provider: Gaspar Cola, MD  DOB: October 04, 1933  DOS: 10/19/2021  Referring Provider: Jerrol Banana.,*  MRN: [id number]  Specialty: Interventional Pain Management  PCP: Jerrol Banana., MD  Type: Established Patient  Setting: Ambulatory outpatient    Location: Office  Delivery: Face-to-face     HPI  Ms. KATASHA RIGA, a 88 y.o. year old female, is here today because of her No primary diagnosis found.. Ms. Reetz's primary complain today is No chief complaint on file. Last encounter: My last encounter with her was on 08/25/2021. Pertinent problems: Ms. Prisco has Cervical muscle strain; DDD (degenerative disc disease), lumbosacral; Chronic venous insufficiency; Chronic pain syndrome; Abnormal MRI, lumbar spine (08/22/2019); Lumbar facet hypertrophy (Multilevel) (Bilateral); Lumbar central spinal stenosis (SEVERE) (L3-4) with neurogenic claudication; Grade 1 Anterolisthesis of lumbar spine (L4/L5); Lumbar facet arthropathy (L3-4 right-sided facet edema/joint effusion); Lumbar lateral recess stenosis (L4-5) (Right); Chronic low back pain (3ry area of Pain) (Bilateral) w/ sciatica (Bilateral); Lumbosacral radiculopathy at L5 (Bilateral); Chronic lower extremity pain (1ry area of Pain) (Bilateral); Lumbosacral facet syndrome; Chronic hip pain (2ry area of Pain) (Bilateral) (R>L); Osteoarthritis involving multiple joints; Osteoarthritis of knee (Right); Osteoarthritis of facet joint of lumbar spine; Osteoarthritis of  lumbar spine; Bilateral lower extremity edema; Cellulitis of right lower leg; Chronic knee pain (Bilateral); Peripheral vascular disease ([place]) (lower extremity) (Bilateral); Cervicalgia; Chronic upper back pain; and Lumbosacral spondylosis with radiculopathy on their pertinent problem list. Pain Assessment: Severity of   is reported as a  /10. Location:    / . Onset:  . Quality:  . Timing:  . Modifying factor(s):  Marland Kitchen Vitals:  vitals were not taken for this visit.   Reason for encounter: medication management. ***  RTCB:   Pharmacotherapy Assessment  Analgesic: Hydrocodone/APAP 10/325, (see the 05/09/2021 note) using 1.675 pills/day. MME/day: 10-20 mg/day   Monitoring: [place] PMP: PDMP reviewed during this encounter.       Pharmacotherapy: No side-effects or adverse reactions reported. Compliance: No problems identified. Effectiveness: Clinically acceptable.  No notes on file  No results found for: "CBDTHCR" No results found for: "D8THCCBX" No results found for: "D9THCCBX"  UDS:  Summary  Date Value Ref Range Status  05/09/2021 Note  Final    Comment:    ==================================================================== [employer] [facility] [employer] (MW) ==================================================================== Test                             Result       Flag       Units  Drug Present and Declared for Prescription Verification   Lorazepam                      1824         EXPECTED   ng/mg creat    Source of lorazepam is a scheduled prescription medication.    Hydrocodone                    2020         EXPECTED   ng/mg  creat   Hydromorphone                  234          EXPECTED   ng/mg creat   Dihydrocodeine                 495          EXPECTED   ng/mg creat   Norhydrocodone                 2173         EXPECTED   ng/mg creat    Sources of hydrocodone include scheduled prescription medications.    Hydromorphone, dihydrocodeine and norhydrocodone are expected    metabolites  of hydrocodone. Hydromorphone and dihydrocodeine are    also available as scheduled prescription medications.  ==================================================================== Test                      Result    Flag   Units      Ref Range   Creatinine              80               mg/dL      >=20 ==================================================================== Declared Medications:  The flagging and interpretation on this report are based on the  following declared medications.  Unexpected results may arise from  inaccuracies in the declared medications.   **Note: The testing scope of this panel includes these medications:   Hydrocodone (Norco)  Lorazepam (Ativan)   **Note: The testing scope of this panel does not include the  following reported medications:   Acetaminophen (Norco)  Albuterol (Ventolin HFA)  Estradiol (Estrace)  Ezetimibe (Zetia)  Flavoxate (Urispas)  Fluticasone (Flonase)  Insulin (Lantus)  Levothyroxine (Synthroid)  Lisinopril (Zestril)  Magnesium  Metolazone  Metoprolol (Toprol)  Nitroglycerin (Nitrostat)  Potassium (Klor-Con)  Ropinirole (Requip)  Torsemide (Demadex)  Undefined Miscellaneous Drug  Warfarin (Coumadin) ==================================================================== For clinical consultation, please call [phone]. ====================================================================       ROS  Constitutional: Denies any fever or chills Gastrointestinal: No reported hemesis, hematochezia, vomiting, or acute GI distress Musculoskeletal: Denies any acute onset joint swelling, redness, loss of ROM, or weakness Neurological: No reported episodes of acute onset apraxia, aphasia, dysarthria, agnosia, amnesia, paralysis, loss of coordination, or loss of consciousness  Medication Review  Accu-Chek Softclix Lancets, HYDROcodone-acetaminophen, Insulin Pen Needle, LORazepam, Magnesium, NONFORMULARY OR COMPOUNDED ITEM,  albuterol, ezetimibe, flavoxATE, fluticasone, glucose blood, insulin glargine, insulin lispro, levothyroxine, lisinopril, metolazone, nitroGLYCERIN, ondansetron, pantoprazole, potassium chloride SA, rOPINIRole, torsemide, and warfarin  History Review  Allergy: Ms. Rotenberry is allergic to duloxetine, other, paroxetine, sulfa antibiotics, cephalexin, clarithromycin, diphenhydramine, estrogens conjugated, estrogens conjugated, sulfonamide derivatives, and nitrofurantoin. Drug: Ms. Kozakiewicz  reports no history of drug use. Alcohol:  reports no history of alcohol use. Tobacco:  reports that she has never smoked. She has never used smokeless tobacco. Social: Ms. Brand  reports that she has never smoked. She has never used smokeless tobacco. She reports that she does not drink alcohol and does not use drugs. Medical:  has a past medical history of Acute myocardial infarction of inferior wall ([place]), Arthritis, Chronic anticoagulation, Chronic atrial fibrillation ([place]), Chronic combined systolic and diastolic CHF (congestive heart failure) ([place]), Essential hypertension, Hyperlipidemia, mixed, Mixed Ischemic/Nonischemic Cardiomyopathy, Nonobstructive coronary artery disease, Obesity, Thyroid disease, Type II diabetes mellitus ([place]), Venous insufficiency, and Vitamin D deficiency. Surgical: Ms. Beneke  has a past surgical  history that includes Vesicovaginal fistula closure w/ TAH (1996); Tubal ligation (1968); Cholecystectomy (1999); and Cardiac catheterization (11/2012). Family: family history includes Heart disease in her mother; Thyroid disease in her father.  Laboratory Chemistry Profile   Renal Lab Results  Component Value Date   BUN 45 (H) 09/19/2021   CREATININE 1.41 (H) 09/19/2021   BCR 31 (H) 06/21/2021   GFRAA 73 12/31/2019   GFRNONAA 36 (L) 09/19/2021    Hepatic Lab Results  Component Value Date   AST 22 06/21/2021   ALT 10 06/21/2021   ALBUMIN 4.0 06/21/2021   ALKPHOS 124 (H) 06/21/2021     Electrolytes Lab Results  Component Value Date   NA 138 09/19/2021   K 5.0 09/19/2021   CL 104 09/19/2021   CALCIUM 9.1 09/19/2021   MG 2.1 11/26/2019   PHOS 3.9 09/08/2014    Bone Lab Results  Component Value Date   25OHVITD1 43 11/26/2019   25OHVITD2 9.2 11/26/2019   25OHVITD3 34 11/26/2019    Inflammation (CRP: Acute Phase) (ESR: Chronic Phase) Lab Results  Component Value Date   CRP 4 11/26/2019   ESRSEDRATE 45 (H) 11/26/2019   LATICACIDVEN 1.4 01/09/2020         Note: Above Lab results reviewed.  Recent Imaging Review  DG PAIN CLINIC C-ARM 1-60 MIN NO REPORT Fluoro was used, but no Radiologist interpretation will be provided.  Please refer to "NOTES" tab for provider progress note. Note: Reviewed        Physical Exam  General appearance: Well nourished, well developed, and well hydrated. In no apparent acute distress Mental status: Alert, oriented x 3 (person, place, & time)       Respiratory: No evidence of acute respiratory distress Eyes: PERLA Vitals: There were no vitals taken for this visit. BMI: Estimated body mass index is 33.5 kg/m as calculated from the following:   Height as of 09/20/21: 5' 5.5" (1.664 m).   Weight as of 10/13/21: 204 lb 6.4 oz (92.7 kg). Ideal: Ideal body weight: 58.2 kg (128 lb 3.2 oz) Adjusted ideal body weight: 72 kg (158 lb 10.9 oz)  Assessment   Diagnosis Status  No diagnosis found. Controlled Controlled Controlled   Updated Problems: No problems updated.  Plan of Care  Problem-specific:  No problem-specific Assessment & Plan notes found for this encounter.  Ms. MAAT KAFER has a current medication list which includes the following long-term medication(s): albuterol, ezetimibe, fluticasone, hydrocodone-acetaminophen, hydrocodone-acetaminophen, hydrocodone-acetaminophen, insulin lispro, levothyroxine, lisinopril, metolazone, nitroglycerin, pantoprazole, potassium chloride sa, ropinirole, torsemide, and  warfarin.  Pharmacotherapy (Medications Ordered): No orders of the defined types were placed in this encounter.  Orders:  No orders of the defined types were placed in this encounter.  Follow-up plan:   No follow-ups on file.     Interventional Therapies  Risk  Complexity Considerations:   Estimated body mass index is 35.73 kg/m as calculated from the following:   Height as of this encounter: 5' 5.5" (1.664 m).   Weight as of this encounter: 218 lb (98.9 kg). NOTE: COUMADIN Anticoagulation (Stop: 5 days  Restart: 2 hours) Decreased GFR Obstructive sleep apnea   Planned  Pending:      Under consideration:   Diagnostic midline caudal ESI #1 + diagnostic epidurogram  Diagnostic bilateral IA hip joint injection #1  Diagnostic right L3-4 LESI #1  Diagnostic right L4 TFESI #1  Diagnostic bilateral L3 TFESI #1  Diagnostic bilateral lumbar facet MBB #1    Completed:   Diagnostic/therapeutic  midline L4-5 LESI x2 (08/04/2021) (100/100/100/100)    Completed by Dr. Alba Destine [facility]):   Diagnostic bilateral L4 TFESI x1 (09/12/2019 - Dr. Alba Destine [[facility]])    Therapeutic  Palliative (PRN) options:   Diagnostic/therapeutic midline L4-5 LESI        Recent Visits Date Type Provider Dept  08/16/21 Office Visit Milinda Pointer, MD [facility]  08/04/21 Procedure visit Milinda Pointer, MD [facility]  Showing recent visits within past 90 days and meeting all other requirements Future Appointments Date Type Provider Dept  10/19/21 Appointment Milinda Pointer, MD [facility]  Showing future appointments within next 90 days and meeting all other requirements  I discussed the assessment and treatment plan with the patient. The patient was provided an opportunity to ask questions and all were answered. The patient agreed with the plan and demonstrated an understanding of the instructions.  Patient advised to call back or seek an in-person evaluation if  the symptoms or condition worsens.  Duration of encounter: *** minutes.  Total time on encounter, as per AMA guidelines included both the face-to-face and non-face-to-face time personally spent by the physician and/or other qualified health care professional(s) on the day of the encounter (includes time in activities that require the physician or other qualified health care professional and does not include time in activities normally performed by clinical staff). Physician's time may include the following activities when performed: preparing to see the patient (eg, review of tests, pre-charting review of records) obtaining and/or reviewing separately obtained history performing a medically appropriate examination and/or evaluation counseling and educating the patient/family/caregiver ordering medications, tests, or procedures referring and communicating with other health care professionals (when not separately reported) documenting clinical information in the electronic or other health record independently interpreting results (not separately reported) and communicating results to the patient/ family/caregiver care coordination (not separately reported)  Note by: Gaspar Cola, MD Date: 10/19/2021; Time: 7:12 AM

## 2021-10-19 ENCOUNTER — Encounter: Payer: Self-pay | Admitting: Pain Medicine

## 2021-10-19 ENCOUNTER — Ambulatory Visit: Payer: Medicare Other | Attending: Pain Medicine | Admitting: Pain Medicine

## 2021-10-19 ENCOUNTER — Ambulatory Visit: Payer: Medicare Other | Admitting: Physician Assistant

## 2021-10-19 VITALS — BP 110/64 | HR 80 | Temp 97.9°F | Resp 18 | Ht 60.0 in | Wt 202.0 lb

## 2021-10-19 DIAGNOSIS — M5441 Lumbago with sciatica, right side: Secondary | ICD-10-CM | POA: Diagnosis present

## 2021-10-19 DIAGNOSIS — G894 Chronic pain syndrome: Secondary | ICD-10-CM | POA: Diagnosis present

## 2021-10-19 DIAGNOSIS — M549 Dorsalgia, unspecified: Secondary | ICD-10-CM

## 2021-10-19 DIAGNOSIS — M47816 Spondylosis without myelopathy or radiculopathy, lumbar region: Secondary | ICD-10-CM | POA: Diagnosis present

## 2021-10-19 DIAGNOSIS — M47817 Spondylosis without myelopathy or radiculopathy, lumbosacral region: Secondary | ICD-10-CM | POA: Diagnosis present

## 2021-10-19 DIAGNOSIS — M79604 Pain in right leg: Secondary | ICD-10-CM

## 2021-10-19 DIAGNOSIS — M25552 Pain in left hip: Secondary | ICD-10-CM | POA: Diagnosis present

## 2021-10-19 DIAGNOSIS — M48061 Spinal stenosis, lumbar region without neurogenic claudication: Secondary | ICD-10-CM | POA: Diagnosis present

## 2021-10-19 DIAGNOSIS — G8929 Other chronic pain: Secondary | ICD-10-CM

## 2021-10-19 DIAGNOSIS — R937 Abnormal findings on diagnostic imaging of other parts of musculoskeletal system: Secondary | ICD-10-CM

## 2021-10-19 DIAGNOSIS — M25561 Pain in right knee: Secondary | ICD-10-CM

## 2021-10-19 DIAGNOSIS — M48062 Spinal stenosis, lumbar region with neurogenic claudication: Secondary | ICD-10-CM | POA: Diagnosis present

## 2021-10-19 DIAGNOSIS — M4316 Spondylolisthesis, lumbar region: Secondary | ICD-10-CM

## 2021-10-19 DIAGNOSIS — M5417 Radiculopathy, lumbosacral region: Secondary | ICD-10-CM

## 2021-10-19 DIAGNOSIS — M25551 Pain in right hip: Secondary | ICD-10-CM

## 2021-10-19 DIAGNOSIS — M5442 Lumbago with sciatica, left side: Secondary | ICD-10-CM

## 2021-10-19 DIAGNOSIS — M25562 Pain in left knee: Secondary | ICD-10-CM | POA: Diagnosis present

## 2021-10-19 DIAGNOSIS — Z79891 Long term (current) use of opiate analgesic: Secondary | ICD-10-CM

## 2021-10-19 DIAGNOSIS — M79605 Pain in left leg: Secondary | ICD-10-CM | POA: Diagnosis present

## 2021-10-19 DIAGNOSIS — Z79899 Other long term (current) drug therapy: Secondary | ICD-10-CM

## 2021-10-19 MED ORDER — HYDROCODONE-ACETAMINOPHEN 10-325 MG PO TABS: 1.0000 | ORAL_TABLET | Freq: Three times a day (TID) | ORAL | 0 refills | Status: DC | PRN

## 2021-10-19 NOTE — Progress Notes (Signed)
Nursing Pain Medication Assessment:  Safety precautions to be maintained throughout the outpatient stay will include: orient to surroundings, keep bed in low position, maintain call bell within reach at all times, provide assistance with transfer out of bed and ambulation.  Medication Inspection Compliance: Pill count conducted under aseptic conditions, in front of the patient. Neither the pills nor the bottle was removed from the patient's sight at any time. Once count was completed pills were immediately returned to the patient in their original bottle.  Medication: Oxycodone IR Pill/Patch Count:  17 of 60 pills remain Pill/Patch Appearance: Markings consistent with prescribed medication Bottle Appearance: Standard pharmacy container. Clearly labeled. Filled Date: 08 / 30 / 2023 Last Medication intake:  Today

## 2021-10-20 ENCOUNTER — Ambulatory Visit: Payer: Medicare Other | Admitting: Physician Assistant

## 2021-10-25 ENCOUNTER — Other Ambulatory Visit: Payer: Self-pay | Admitting: Family Medicine

## 2021-10-25 MED ORDER — NITROFURANTOIN MONOHYD MACRO 100 MG PO CAPS: 100.0000 mg | ORAL_CAPSULE | Freq: Two times a day (BID) | ORAL | 0 refills | Status: DC

## 2021-10-26 ENCOUNTER — Ambulatory Visit: Payer: Medicare Other

## 2021-10-26 DIAGNOSIS — I482 Chronic atrial fibrillation, unspecified: Secondary | ICD-10-CM

## 2021-10-26 LAB — POCT INR
INR: 2.2 (ref 2.0–3.0)
PT: 26.8

## 2021-10-26 NOTE — Patient Instructions (Signed)
Description   Take 4 mg daily except 3 mg on F.  Recheck in 3 weeks.     

## 2021-10-27 ENCOUNTER — Ambulatory Visit
Admission: RE | Admit: 2021-10-27 | Discharge: 2021-10-27 | Disposition: A | Discharge status: Home / Self Care | Payer: Medicare Other | Source: Ambulatory Visit | Attending: Pain Medicine | Admitting: Pain Medicine

## 2021-10-27 ENCOUNTER — Other Ambulatory Visit: Payer: Self-pay | Admitting: Cardiovascular Disease

## 2021-10-27 DIAGNOSIS — M79604 Pain in right leg: Secondary | ICD-10-CM | POA: Insufficient documentation

## 2021-10-27 DIAGNOSIS — M5441 Lumbago with sciatica, right side: Secondary | ICD-10-CM | POA: Insufficient documentation

## 2021-10-27 DIAGNOSIS — M25562 Pain in left knee: Secondary | ICD-10-CM | POA: Insufficient documentation

## 2021-10-27 DIAGNOSIS — M5417 Radiculopathy, lumbosacral region: Secondary | ICD-10-CM | POA: Diagnosis present

## 2021-10-27 DIAGNOSIS — M25551 Pain in right hip: Secondary | ICD-10-CM | POA: Insufficient documentation

## 2021-10-27 DIAGNOSIS — M4316 Spondylolisthesis, lumbar region: Secondary | ICD-10-CM | POA: Diagnosis present

## 2021-10-27 DIAGNOSIS — M25552 Pain in left hip: Secondary | ICD-10-CM | POA: Insufficient documentation

## 2021-10-27 DIAGNOSIS — M79605 Pain in left leg: Secondary | ICD-10-CM | POA: Insufficient documentation

## 2021-10-27 DIAGNOSIS — M47816 Spondylosis without myelopathy or radiculopathy, lumbar region: Secondary | ICD-10-CM | POA: Insufficient documentation

## 2021-10-27 DIAGNOSIS — M5442 Lumbago with sciatica, left side: Secondary | ICD-10-CM | POA: Insufficient documentation

## 2021-10-27 DIAGNOSIS — M25561 Pain in right knee: Secondary | ICD-10-CM | POA: Insufficient documentation

## 2021-10-27 DIAGNOSIS — M48061 Spinal stenosis, lumbar region without neurogenic claudication: Secondary | ICD-10-CM | POA: Diagnosis present

## 2021-10-27 DIAGNOSIS — M48062 Spinal stenosis, lumbar region with neurogenic claudication: Secondary | ICD-10-CM | POA: Insufficient documentation

## 2021-10-27 DIAGNOSIS — G8929 Other chronic pain: Secondary | ICD-10-CM | POA: Insufficient documentation

## 2021-10-27 DIAGNOSIS — R937 Abnormal findings on diagnostic imaging of other parts of musculoskeletal system: Secondary | ICD-10-CM | POA: Diagnosis present

## 2021-11-16 ENCOUNTER — Ambulatory Visit (INDEPENDENT_AMBULATORY_CARE_PROVIDER_SITE_OTHER): Payer: Medicare Other

## 2021-11-16 DIAGNOSIS — I482 Chronic atrial fibrillation, unspecified: Secondary | ICD-10-CM | POA: Diagnosis not present

## 2021-11-16 LAB — POCT INR
INR: 2 (ref 2.0–3.0)
PT: 23.8

## 2021-11-16 NOTE — Patient Instructions (Addendum)
  Description   Take 4 mg daily except 3 mg on F.  Recheck in 3 weeks.

## 2021-11-18 IMAGING — CT CT ABD-PEL WO/W CM
2 of 12 series · 10 of 46 positions shown, 15 images · IV contrast (omnipaque)
Comparison: Renal sonogram of 04/11/2019

CLINICAL DATA: Renal mass. Abnormal ultrasound. Also with some
reported abdominal pain.

EXAM:
CT ABDOMEN AND PELVIS WITHOUT AND WITH CONTRAST
TECHNIQUE: Multidetector CT imaging of the abdomen and pelvis was performed
following the standard protocol before and following the bolus
administration of intravenous contrast.
CONTRAST:  100mL OMNIPAQUE IOHEXOL 300 MG/ML  SOLN

[Series 5: coronal without renal without 2.00 cor · coronal · non-contrast · 0.49mm/px · 2 of 172 slices shown]
[im 58/172  soft-tissue]
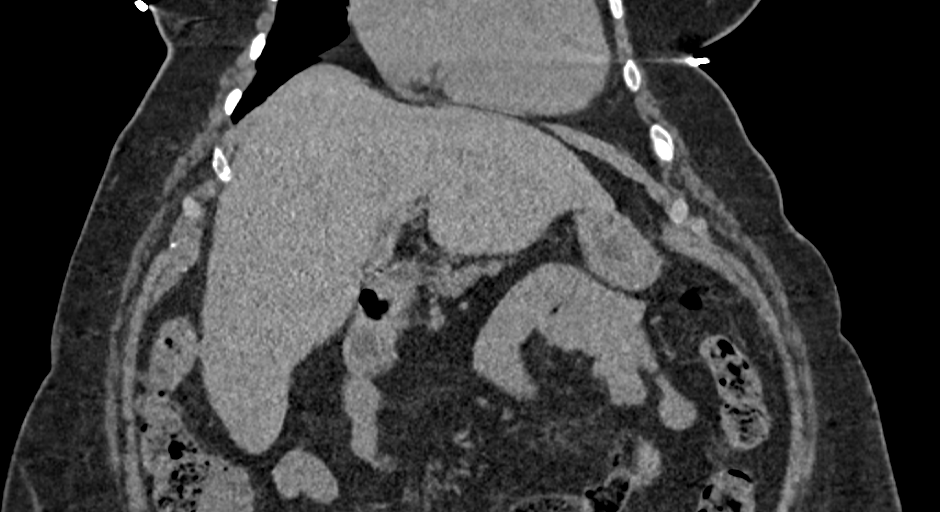
[im 115/172  soft-tissue]
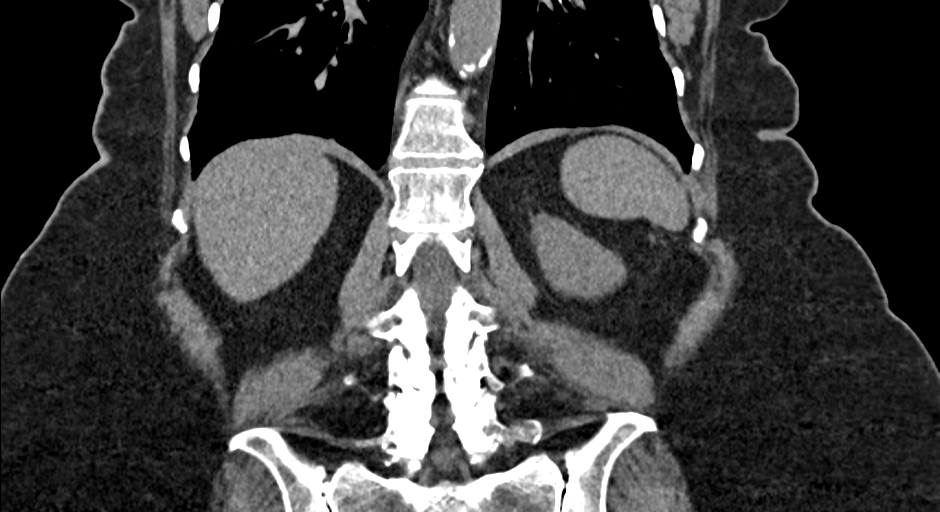

[Series 16: axial nephrographic renal nephrographic 2.00 · axial · 0.90mm/px · z∈[-1425,-1069]mm · 8 of 224 slices shown, 13 images]
[im 23/224  soft-tissue]
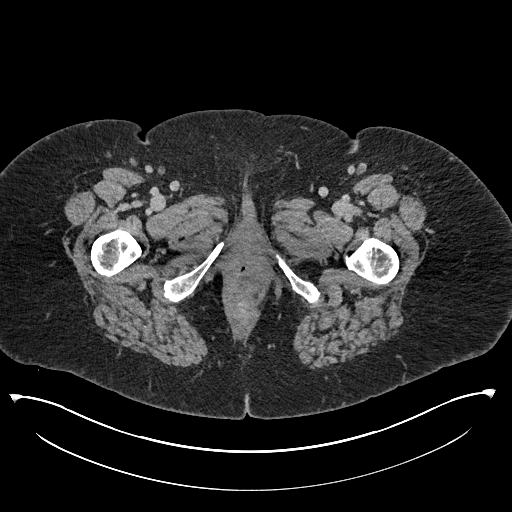
[im 23/224  bone]
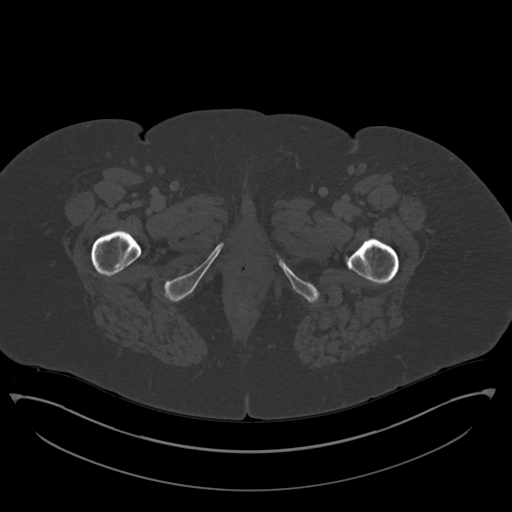
[im 45/224  soft-tissue]
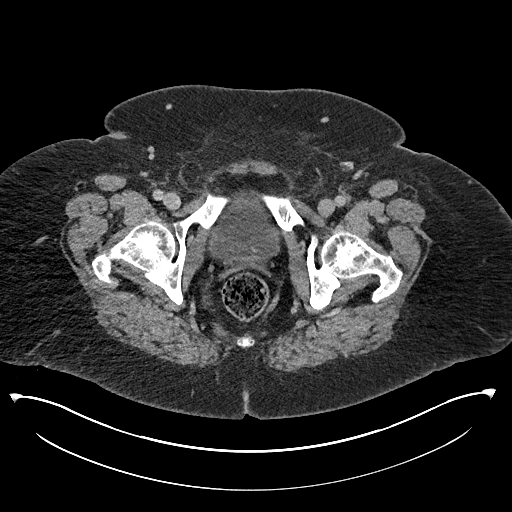
[im 67/224  soft-tissue]
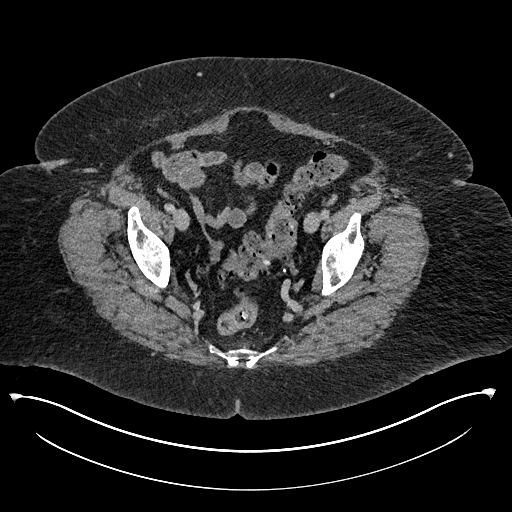
[im 90/224  soft-tissue]
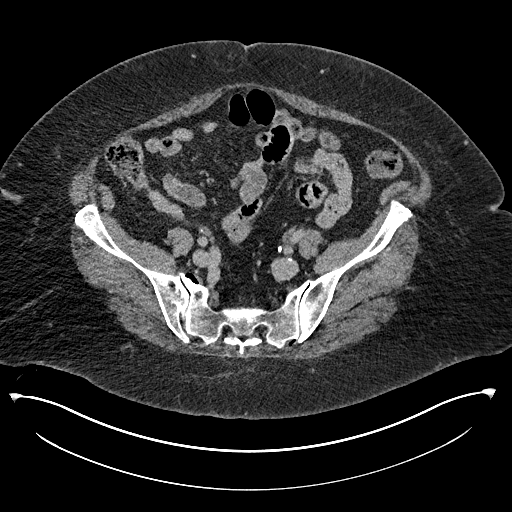
[im 134/224  soft-tissue]
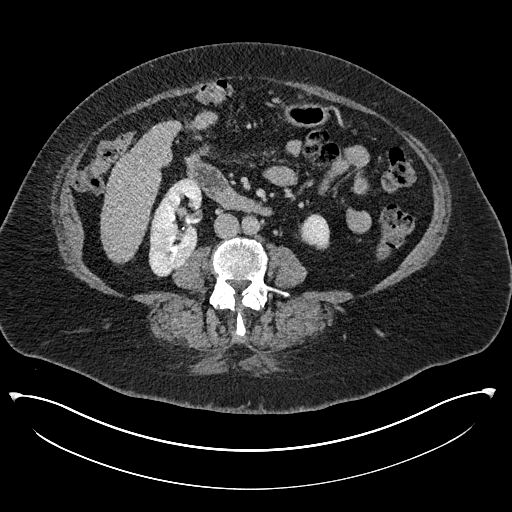
[im 134/224  lung]
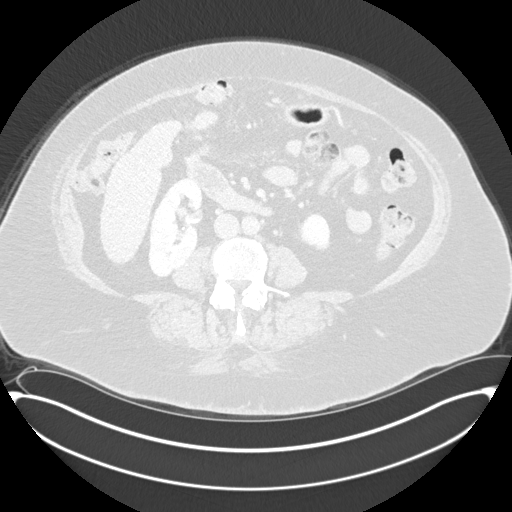
[im 157/224  soft-tissue]
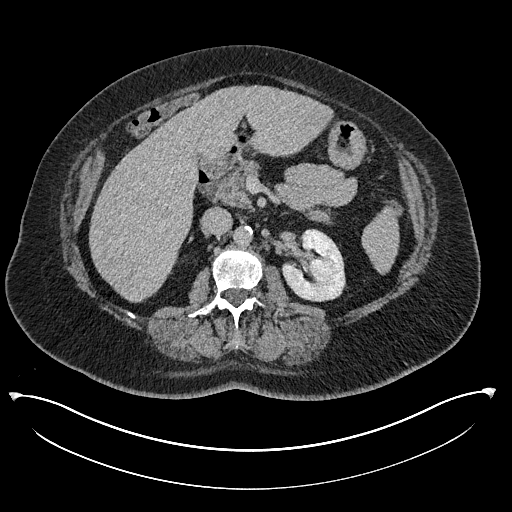
[im 157/224  lung]
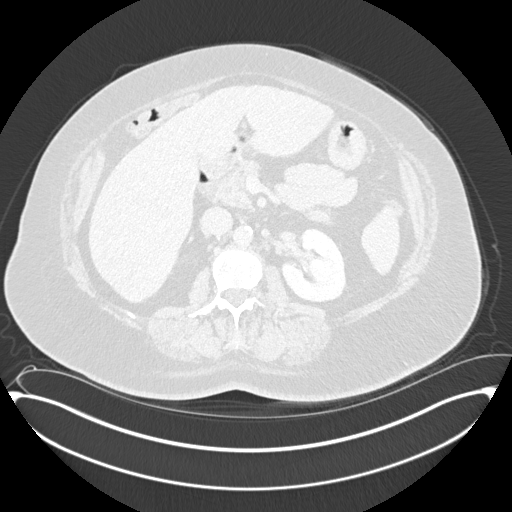
[im 179/224  soft-tissue]
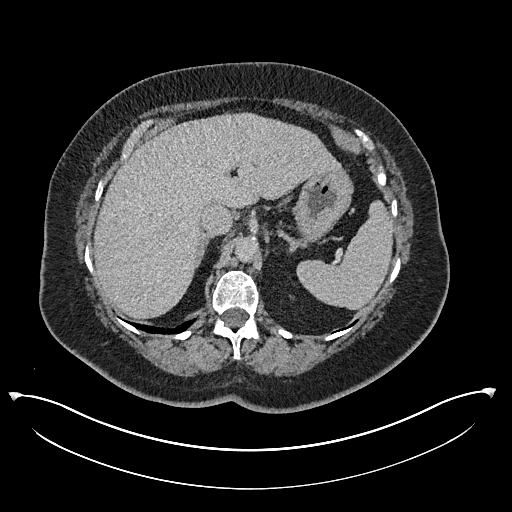
[im 179/224  lung]
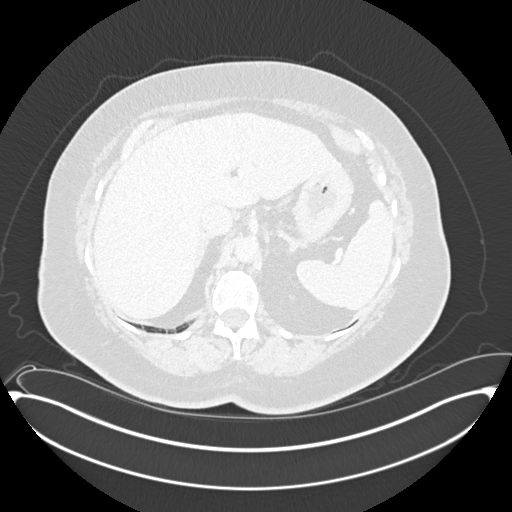
[im 201/224  soft-tissue]
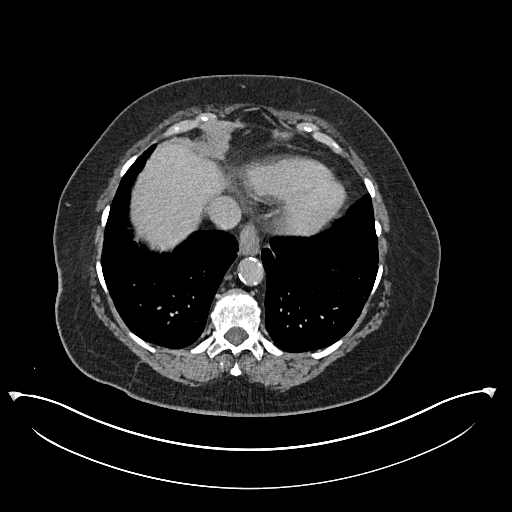
[im 201/224  lung]
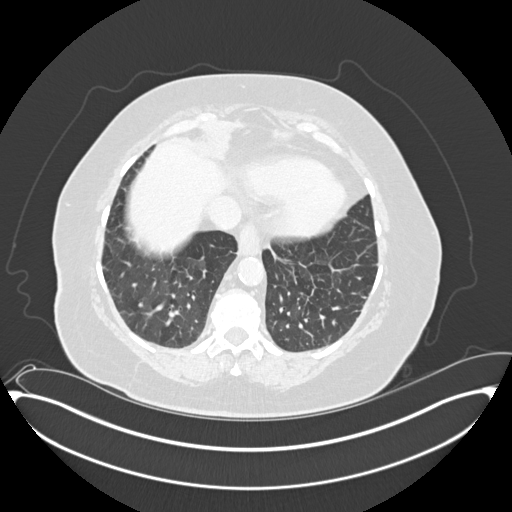

[10 of 46 positions shown; findings below may reference images not displayed]

FINDINGS: Lower chest: Areas of ground-glass attenuation and septal thickening
at the lung bases. Ground-glass is mainly geographic. No
consolidation or pleural effusion.

Heart size is enlarged with four-chamber enlargement. Heart is
incompletely imaged.

Hepatobiliary: No focal, suspicious hepatic lesion. Nodular hepatic
contours. Portal vein is patent. Post cholecystectomy with mild
extrahepatic biliary ductal distension likely post cholecystectomy
baseline.

Pancreas: Pancreas without pancreatic inflammation or ductal
dilation.

Spleen: Spleen is normal size without focal lesion.

Adrenals/Urinary Tract: Adrenal glands are normal.

Ptotic right kidney. Mild cortical scarring bilaterally. Small
low-density lesions in the bilateral kidneys too small for
definitive characterization on the right but likely cysts.

A single interpolar presumed cyst measuring 6 mm on the left and a
cyst in the lower pole measuring 12 mm by 12 mm (image 87, series
16)

No hydronephrosis or nephrolithiasis.

Perivesical stranding, urinary bladder is under distended but
question of generalized bladder wall thickening.

Stomach/Bowel: No acute gastrointestinal process. Colonic
diverticulosis and diverticular changes. Appendix is not visualized
no periappendiceal stranding.

Vascular/Lymphatic: Calcified and noncalcified atheromatous plaque
in the abdominal aorta without aneurysm. No retroperitoneal, upper
abdominal or pelvic lymphadenopathy.

Reproductive: Hysterectomy. No adnexal mass.

Other: Small right paramidline periumbilical hernia contains only
fat.

Musculoskeletal: Spinal degenerative change without acute or
destructive bone process.
IMPRESSION: 1. Signs of cystitis.
2. Bilateral renal cysts without renal mass.
3. Nodular hepatic contour, correlate with any clinical signs of
liver disease.
4. Ground-glass attenuation at the lung bases with mild bronchial
wall thickening, this may be sequela of small airways
disease/bronchiolitis. Correlate with any clinical signs of
infection or recent history of respiratory symptoms.
5. Cardiomegaly.

Aortic Atherosclerosis (ICE8M-2GT.T).

## 2021-11-28 ENCOUNTER — Encounter (INDEPENDENT_AMBULATORY_CARE_PROVIDER_SITE_OTHER): Payer: Self-pay

## 2021-11-29 ENCOUNTER — Ambulatory Visit: Payer: Medicare Other | Admitting: Family Medicine

## 2021-11-30 ENCOUNTER — Ambulatory Visit (INDEPENDENT_AMBULATORY_CARE_PROVIDER_SITE_OTHER): Payer: Medicare Other

## 2021-11-30 DIAGNOSIS — I482 Chronic atrial fibrillation, unspecified: Secondary | ICD-10-CM | POA: Diagnosis not present

## 2021-11-30 LAB — POCT INR
INR: 2 (ref 2.0–3.0)
PT: 23.5

## 2021-11-30 NOTE — Patient Instructions (Signed)
Description   Take 4 mg daily except 3 mg on F.  Recheck in 3 weeks.

## 2021-12-15 ENCOUNTER — Encounter: Payer: Self-pay | Admitting: Physician Assistant

## 2021-12-15 ENCOUNTER — Ambulatory Visit: Payer: Medicare Other | Admitting: Physician Assistant

## 2021-12-15 VITALS — BP 115/57 | HR 83 | Wt 210.0 lb

## 2021-12-15 DIAGNOSIS — R300 Dysuria: Secondary | ICD-10-CM | POA: Diagnosis not present

## 2021-12-15 DIAGNOSIS — K429 Umbilical hernia without obstruction or gangrene: Secondary | ICD-10-CM | POA: Insufficient documentation

## 2021-12-15 DIAGNOSIS — K649 Unspecified hemorrhoids: Secondary | ICD-10-CM

## 2021-12-15 MED ORDER — HYDROCORTISONE 2 % EX LOTN: TOPICAL_LOTION | CUTANEOUS | 0 refills | Status: DC

## 2021-12-15 NOTE — Assessment & Plan Note (Signed)
Not reducible but no signs of obstruction Ref to GI may need gen surg referral

## 2021-12-15 NOTE — Assessment & Plan Note (Signed)
Unable to visualize today pt unable to get on table.  Rx hydrocortisone, can use topically for up to 6 days. Ref to GI

## 2021-12-15 NOTE — Progress Notes (Signed)
I,Tracy Mann,acting as a Education administrator for [employer], PA-C.,have documented all relevant documentation on the behalf of Tracy Kirschner, PA-C,as directed by  Tracy Kirschner, PA-C while in the presence of Tracy Kirschner, PA-C.   Established patient visit   Patient: Tracy Mann   DOB: 03-May-1933   88 y.o. Female  MRN: [id number] Visit Date: 12/15/2021  Today's healthcare provider: [name], PA-C   Cc. Dysuria x 4 days  Subjective    HPI   Tracy Mann is a 88 y/o female with a history of interstitial cystitis and frequent urinary infections. She follows with Urology and was given a script of macrobid to have on hand when she feels she has an infection.  Pt reports dysuria and frequency, has been taking the macrobid rx for 4 days along with pyridium.  She also reports a long-standing umbilical hernia that is bothering her, as well as hemorrhoids that are flaring as well. Reports sporadic small amounts of rectal bleeding.  Medications: Outpatient Medications Prior to Visit  Medication Sig   ACCU-CHEK AVIVA PLUS test strip USE WITH METER TO CHECK GLUCOSE BEFORE MEALS AND AT BEDTIME   Accu-Chek Softclix Lancets lancets SMARTSIG:Topical   albuterol (VENTOLIN HFA) 108 (90 Base) MCG/ACT inhaler TAKE 2 PUFFS BY MOUTH EVERY 6 HOURS AS NEEDED FOR WHEEZE OR SHORTNESS OF BREATH   ezetimibe (ZETIA) 10 MG tablet Take 1 tablet (10 mg total) by mouth daily.   flavoxATE (URISPAS) 100 MG tablet Take 1 tablet (100 mg total) by mouth 2 (two) times daily as needed for bladder spasms.   fluticasone (FLONASE) 50 MCG/ACT nasal spray Place 2 sprays into both nostrils daily.   HYDROcodone-acetaminophen (NORCO) 10-325 MG tablet Take 1 tablet by mouth every 8 (eight) hours as needed for severe pain. Must last 30 days.   [START ON 12/27/2021] HYDROcodone-acetaminophen (NORCO) 10-325 MG tablet Take 1 tablet by mouth every 8 (eight) hours as needed for severe pain. Must last 30 days.   insulin glargine  (LANTUS SOLOSTAR) 100 UNIT/ML Solostar Pen Inject 24 Units into the skin at bedtime.   insulin lispro (HUMALOG) 100 UNIT/ML KwikPen Inject 10-25 Units into the skin in the morning, at noon, and at bedtime.   Insulin Pen Needle (RELION PEN NEEDLE 31G/8MM) 31G X 8 MM MISC Use with insulin pen three times daily   levofloxacin (LEVAQUIN) 500 MG tablet Take 1 tablet (500 mg total) by mouth daily.   levothyroxine (SYNTHROID) 50 MCG tablet Take 50 mcg by mouth every morning.   lisinopril (ZESTRIL) 10 MG tablet Take 1 tablet (10 mg total) by mouth daily.   LORazepam (ATIVAN) 1 MG tablet TAKE 1 TABLET(1 MG) BY MOUTH TWICE DAILY AS NEEDED   Magnesium 500 MG CAPS Take 1,000 mg by mouth daily.    metolazone (ZAROXOLYN) 2.5 MG tablet Take 1 tablet (2.5 mg total) by mouth as directed. Take two times a week with torsemide 40 mg   nitrofurantoin, macrocrystal-monohydrate, (MACROBID) 100 MG capsule Take 1 capsule (100 mg total) by mouth every 12 (twelve) hours.   nitroGLYCERIN (NITROSTAT) 0.4 MG SL tablet Place 1 tablet (0.4 mg total) under the tongue every 5 (five) minutes as needed for chest pain.   NONFORMULARY OR COMPOUNDED ITEM See pharmacy note- Not to use on any open skin or wound.   ondansetron (ZOFRAN) 4 MG tablet Take 1 tablet (4 mg total) by mouth every 12 (twelve) hours as needed for nausea or vomiting.   pantoprazole (PROTONIX) 40 MG tablet Take 1  tablet (40 mg total) by mouth daily.   potassium chloride SA (KLOR-CON) 20 MEQ tablet Take 1 tablet (20 mEq total) by mouth daily.   rOPINIRole (REQUIP) 0.25 MG tablet TAKE 1 TABLET BY MOUTH EVERYDAY AT BEDTIME   torsemide (DEMADEX) 20 MG tablet Take 2 tablets (40 mg total) by mouth daily.   warfarin (COUMADIN) 4 MG tablet Take daily or as directed by physician   HYDROcodone-acetaminophen (NORCO) 10-325 MG tablet Take 1 tablet by mouth every 8 (eight) hours as needed for severe pain. Must last 30 days.   No facility-administered medications prior to visit.     Review of Systems  Constitutional:  Negative for fatigue and fever.  Respiratory:  Negative for cough and shortness of breath.   Cardiovascular:  Negative for chest pain and leg swelling.  Gastrointestinal:  Negative for abdominal pain.  Neurological:  Negative for dizziness and headaches.      Objective    Blood pressure (!) 115/57, pulse 83, weight 210 lb (95.3 kg), SpO2 95 %.   Physical Exam Vitals reviewed.  Constitutional:      Appearance: She is not ill-appearing.  HENT:     Head: Normocephalic.  Eyes:     Conjunctiva/sclera: Conjunctivae normal.  Cardiovascular:     Rate and Rhythm: Normal rate.  Pulmonary:     Effort: Pulmonary effort is normal. No respiratory distress.  Abdominal:     Palpations: Abdomen is soft.     Hernia: A hernia is present. Hernia is present in the umbilical area.     Comments: Non reducible umbilical hernia, tender per pt. No associated erythema or skin changes.   Neurological:     General: No focal deficit present.     Mental Status: She is alert and oriented to person, place, and time.  Psychiatric:        Mood and Affect: Mood normal.        Behavior: Behavior normal.     No results found for any visits on 12/15/21.  Assessment & Plan     Dysuria Unable to read UA d/t pyridium use. Will wait for culture to treat given hx of recurrent UTIs and pt's current abx therapy.   Problem List Items Addressed This Visit       Cardiovascular and Mediastinum   Hemorrhoids    Unable to visualize today pt unable to get on table.  Rx hydrocortisone, can use topically for up to 6 days. Ref to GI      Relevant Medications   HYDROCORTISONE, TOPICAL, 2 % LOTN   Other Relevant Orders   Ambulatory referral to Gastroenterology     Other   Umbilical hernia without obstruction and without gangrene    Not reducible but no signs of obstruction Ref to GI may need gen surg referral      Relevant Orders   Ambulatory referral to  Gastroenterology   Other Visit Diagnoses     Dysuria    -  Primary   Relevant Orders   POCT Urinalysis Dipstick   Urine Culture        Return if symptoms worsen or fail to improve.      I, Tracy Kirschner, PA-C have reviewed all documentation for this visit. The documentation on    for the exam, diagnosis, procedures, and orders are all accurate and complete.  Tracy Kirschner, PA-C [facility] [address] [place], [place], [address] Office: [phone] Fax: [place]

## 2021-12-18 LAB — URINE CULTURE

## 2021-12-19 ENCOUNTER — Encounter (HOSPITAL_COMMUNITY): Payer: Medicare Other | Admitting: Internal Medicine

## 2021-12-19 NOTE — Progress Notes (Unsigned)
Cardiology Office Note  Date:  12/21/2021   ID:  Tracy Mann 1933-10-28, MRN [id number]  PCP:  Jerrol Banana., MD   Chief Complaint  Patient presents with   3 month follow up     Patient c/o shortness of breath & bilateral LE edema. Medications reviewed by the patient verbally.     HPI:  Tracy Mann is a 88 year old woman with a PMH significant for  chronic atrial fibrillation on Coumadin therapy,  diastolic and systolic dysfunction, EF 40 to 45% on echo 01/2018  hypertension,  hyperlipidemia,  morbid obesity,   moderate pulmonary hypertension inferior wall myocardial infarction felt to be secondary to a thrombus from atrial fib in October 2007 at [facility],  episodes of chest pain  Ejection fraction 30 to 35%, moderately elevated right heart pressures, July 16, 2020 following COVID Medication intolerances EF 30 to 35% on echo 05/18/21 who presents for followup Of her coronary artery disease and leg edema., persistent atrial fibrillation, cardiomyopathy  Last seen by myself August 2023 In follow-up today she reports that she feels well Feels the medications she is on is not causing significant side effects Weight up 7 pounds from aug 2023 Reports that she is compliant with torsemide 40 daily, metolazone 2.5 mg two times a week Goal weight around 202 pounds Denies significant leg swelling, no PND orthopnea  Lab work reviewed A1C 7.0, down from 7.7  Prior medication intolerances Stopped metoprolol, had dizzy spell/near syncope intolerances to [place], carvedilol unable to tolerate [place], makes her sneeze, cough 30 minutes after taking it with a runny nose and eye watering  EKG personally reviewed by myself on todays visit Atrial fibrillation rate 85 bpm interventricular conduction delay  Prior imaging reviewed Echocardiogram April 19 with ejection fraction 30 to 35% Moderate to severely elevated right heart pressures Severe biatrial  enlargement moderate to severe MR No dramatic changes compared to study dated June 22  Previous attempt to use Carvedilol 3.25 twice daily started, Entresto 24/26 unsuccessful secondary to intolerance Went back on lisinopril, stopped the coreg  COVID infection early 2022  Does not use her lymphedema compression pumps  HBA1C 9.1 on labs 12/2018 HBA1C 8.3 on 05/20/2019  PMH:   has a past medical history of Acute myocardial infarction of inferior wall ([place]), Arthritis, Chronic anticoagulation, Chronic atrial fibrillation ([place]), Chronic combined systolic and diastolic CHF (congestive heart failure) ([place]), Essential hypertension, Hyperlipidemia, mixed, Mixed Ischemic/Nonischemic Cardiomyopathy, Nonobstructive coronary artery disease, Obesity, Thyroid disease, Type II diabetes mellitus ([place]), Venous insufficiency, and Vitamin D deficiency.  PSH:    Past Surgical History:  Procedure Laterality Date   CARDIAC CATHETERIZATION  11/2012   [facility];EF 45-50%   CHOLECYSTECTOMY  1999   TUBAL LIGATION  1968   VESICOVAGINAL FISTULA CLOSURE W/ TAH  1996    Current Outpatient Medications  Medication Sig Dispense Refill   albuterol (VENTOLIN HFA) 108 (90 Base) MCG/ACT inhaler TAKE 2 PUFFS BY MOUTH EVERY 6 HOURS AS NEEDED FOR WHEEZE OR SHORTNESS OF BREATH 8.5 each 2   ezetimibe (ZETIA) 10 MG tablet Take 1 tablet (10 mg total) by mouth daily. 90 tablet 2   flavoxATE (URISPAS) 100 MG tablet Take 1 tablet (100 mg total) by mouth 2 (two) times daily as needed for bladder spasms. 60 tablet 11   fluticasone (FLONASE) 50 MCG/ACT nasal spray Place 2 sprays into both nostrils daily. 48 mL 3   HYDROcodone-acetaminophen (NORCO) 10-325 MG tablet Take 1 tablet by mouth every 8 (  eight) hours as needed for severe pain. Must last 30 days. 60 tablet 0   HYDROCORTISONE, TOPICAL, 2 % LOTN Use topically BID for only 6 days 29.6 mL 0   insulin glargine (LANTUS SOLOSTAR) 100 UNIT/ML Solostar Pen Inject 24 Units into the skin at  bedtime.     insulin lispro (HUMALOG) 100 UNIT/ML KwikPen Inject 10-25 Units into the skin in the morning, at noon, and at bedtime.     Insulin Pen Needle (RELION PEN NEEDLE 31G/8MM) 31G X 8 MM MISC Use with insulin pen three times daily 100 each 12   levothyroxine (SYNTHROID) 25 MCG tablet Take 25 mcg by mouth daily before breakfast.     lisinopril (ZESTRIL) 10 MG tablet Take 1 tablet (10 mg total) by mouth daily. 90 tablet 3   LORazepam (ATIVAN) 1 MG tablet TAKE 1 TABLET(1 MG) BY MOUTH TWICE DAILY AS NEEDED 60 tablet 5   Magnesium 500 MG CAPS Take 1,000 mg by mouth daily.      metolazone (ZAROXOLYN) 2.5 MG tablet Take 1 tablet (2.5 mg total) by mouth as directed. Take two times a week with torsemide 40 mg 90 tablet 1   nitrofurantoin, macrocrystal-monohydrate, (MACROBID) 100 MG capsule Take 1 capsule (100 mg total) by mouth every 12 (twelve) hours. 14 capsule 0   nitroGLYCERIN (NITROSTAT) 0.4 MG SL tablet Place 1 tablet (0.4 mg total) under the tongue every 5 (five) minutes as needed for chest pain. 25 tablet 1   NONFORMULARY OR COMPOUNDED ITEM See pharmacy note- Not to use on any open skin or wound. 120 each 3   ondansetron (ZOFRAN) 4 MG tablet Take 1 tablet (4 mg total) by mouth every 12 (twelve) hours as needed for nausea or vomiting. 60 tablet 2   pantoprazole (PROTONIX) 40 MG tablet Take 1 tablet (40 mg total) by mouth daily. 30 tablet 11   potassium chloride SA (KLOR-CON) 20 MEQ tablet Take 1 tablet (20 mEq total) by mouth daily. 90 tablet 3   rOPINIRole (REQUIP) 0.25 MG tablet TAKE 1 TABLET BY MOUTH EVERYDAY AT BEDTIME 90 tablet 1   torsemide (DEMADEX) 20 MG tablet Take 2 tablets (40 mg total) by mouth daily. 60 tablet 6   warfarin (COUMADIN) 4 MG tablet Take daily or as directed by physician 90 tablet 4   ACCU-CHEK AVIVA PLUS test strip USE WITH METER TO CHECK GLUCOSE BEFORE MEALS AND AT BEDTIME (Patient not taking: Reported on 12/21/2021) 200 strip 12   Accu-Chek Softclix Lancets lancets  SMARTSIG:Topical (Patient not taking: Reported on 12/21/2021)     HYDROcodone-acetaminophen (NORCO) 10-325 MG tablet Take 1 tablet by mouth every 8 (eight) hours as needed for severe pain. Must last 30 days. (Patient not taking: Reported on 12/21/2021) 60 tablet 0   [START ON 12/27/2021] HYDROcodone-acetaminophen (NORCO) 10-325 MG tablet Take 1 tablet by mouth every 8 (eight) hours as needed for severe pain. Must last 30 days. (Patient not taking: Reported on 12/21/2021) 60 tablet 0   levofloxacin (LEVAQUIN) 500 MG tablet Take 1 tablet (500 mg total) by mouth daily. (Patient not taking: Reported on 12/21/2021) 3 tablet 0   No current facility-administered medications for this visit.    Allergies:   Duloxetine, Paroxetine, Sulfa antibiotics, Cephalexin, Clarithromycin, Diphenhydramine, Estrogens conjugated, and Nitrofurantoin   Social History:  The patient  reports that she has never smoked. She has never used smokeless tobacco. She reports that she does not drink alcohol and does not use drugs.   Family History:   family  history includes Heart disease in her mother; Thyroid disease in her father.    Review of Systems: Review of Systems  Constitutional: Negative.   HENT: Negative.    Respiratory: Negative.    Cardiovascular: Negative.   Gastrointestinal: Negative.   Musculoskeletal: Negative.        Leg weakness  Neurological: Negative.   Psychiatric/Behavioral: Negative.    All other systems reviewed and are negative.   PHYSICAL EXAM: VS:  BP 100/60 (BP Location: Left Arm, Patient Position: Sitting, Cuff Size: Normal)   Pulse 85   Ht 5' 5.5" (1.664 m)   Wt 209 lb (94.8 kg)   SpO2 90%   BMI 34.25 kg/m  , BMI Body mass index is 34.25 kg/m. Constitutional:  oriented to person, place, and time. No distress.  HENT:  Head: Grossly normal Eyes:  no discharge. No scleral icterus.  Neck: No JVD, no carotid bruits  Cardiovascular: Regular rate and rhythm, no murmurs  appreciated Pulmonary/Chest: Clear to auscultation bilaterally, no wheezes or rails Abdominal: Soft.  no distension.  no tenderness.  Musculoskeletal: Normal range of motion Neurological:  normal muscle tone. Coordination normal. No atrophy Skin: Skin warm and dry Psychiatric: normal affect, pleasant  Recent Labs: 06/14/2021: Hemoglobin 13.5; Platelets 181 06/21/2021: ALT 10; NT-Pro BNP 1,723 09/19/2021: B Natriuretic Peptide 83.4; BUN 45; Creatinine, Ser 1.41; Potassium 5.0; Sodium 138    Lipid Panel Wt Readings from Last 3 Encounters:  12/21/21 209 lb (94.8 kg)  12/15/21 210 lb (95.3 kg)  10/19/21 202 lb (91.6 kg)     ASSESSMENT AND PLAN:  Acute on chronic diastolic and systolic CHF Weight has increased above her goal weight Goal weight 202 pounds Stressed importance of staying on her torsemide 40 daily with potassium 20 Metolazone 2.5 mg twice a week, Numerous medication intolerances, did not tolerate carvedilol, metoprolol held for near-syncope, did not tolerate Entresto  tolerating lisinopril 10 with her diuretic as above We recommend she hold metolazone for weight less than 195 Consider extra torsemide 40 mg after lunch for weight gain, shortness of breath, abdominal swelling (would take extra torsemide sparingly no more than 2 or 3 times a week)  Cardiomyopathy Last evaluation April 2023 ejection fraction 30 to 35% Previous attempts at goal-directed medical therapy unsuccessful secondary to medication intolerances did not tolerate Farxiga, has not tolerated Entresto, spironolactone, Coreg No medication changes made on today's visit Stressed importance of diuretic compliance  Mixed  hyperlipidemia Continue Zetia Statin intolerance, no changes made  Essential hypertension  Lisinopril  10 daily  She recently stopped metoprolol for near-syncope No changes made  Chronic atrial fibrillation (HCC)  A. fib  contributing to CHF On warfarin,  Rate relatively well  controlled off beta-blocker, she previously held metoprolol  Obstructive sleep apnea Previously reported this was "not a major issue" Currently not on CPAP   Type 2 diabetes mellitus without complication, with long-term current use of insulin ([place]) We have encouraged continued exercise, careful diet management in an effort to lose weight. Followed by endocrine, could consider Ozempic/Mounjaro    Total encounter time more than 30 minutes  Greater than 50% was spent in counseling and coordination of care with the patient   Orders Placed This Encounter  Procedures   EKG 12-Lead     Signed, Tracy Mann, M.D., Ph.D. 12/21/2021  [place], [place]

## 2021-12-20 ENCOUNTER — Ambulatory Visit: Payer: Self-pay | Admitting: *Deleted

## 2021-12-20 NOTE — Telephone Encounter (Signed)
Contacted patient and she states she doesn't think she can make it in tomorrow. Advised she should to to UC due to her symptoms patient verbalized understanding and that she may not even make it to her cardio appointment tomrrow. Advised if she does to also stop at UC due to fever and symptoms.  Rescheduled patient PT/INR for next Wednesday at patient request

## 2021-12-20 NOTE — Telephone Encounter (Signed)
Reason for Disposition  [1] SEVERE pain with urination (e.g., excruciating) AND [2] not improved after 2 hours of pain medicine and Sitz bath  Answer Assessment - Initial Assessment Questions 1. SEVERITY: "How bad is the pain?"  (e.g., Scale 1-10; mild, moderate, or severe)   - MILD (1-3): complains slightly about urination hurting   - MODERATE (4-7): interferes with normal activities     - SEVERE (8-10): excruciating, unwilling or unable to urinate because of the pain      I had burning this morning and dry heaves.     I know I have a UTI.   I've had these before.  Even though the PA said I didn't have one.    Dr. Rosanna Randy is gone.    2. FREQUENCY: "How many times have you had painful urination today?"      It was burning bad this morning.   I'm burning all the time.  My lower abd area and my right side.    3. PATTERN: "Is pain present every time you urinate or just sometimes?"      Yes 4. ONSET: "When did the painful urination start?"      I need a strong antibiotic.    This PA did not listen to me last Thur.   Last week and it's gotten worse.   Dr. Rosanna Randy knew how to treat me but this PA doesn't listen to me.   She sent a urine culture. 5. FEVER: "Do you have a fever?" If Yes, ask: "What is your temperature, how was it measured, and when did it start?"     I did have fever last night but not now. 6. PAST UTI: "Have you had a urine infection before?" If Yes, ask: "When was the last time?" and "What happened that time?"      Yes 7. CAUSE: "What do you think is causing the painful urination?"  (e.g., UTI, scratch, Herpes sore)     UTI 8. OTHER SYMPTOMS: "Do you have any other symptoms?" (e.g., blood in urine, flank pain, genital sores, urgency, vaginal discharge)     My lower abd.   I had back pain Sunday and yesterday. 9. PREGNANCY: "Is there any chance you are pregnant?" "When was your last menstrual period?"     N/A due to age  Protocols used: Urination Pain - Female-A-AH  Chief  Complaint: Severe burning when I urinate and nausea.  The Levaquin is the only thing that knocks out these infections.   I've had them several times before.  The PA told me I don't have a UTI but in MyChart it says I have bacteria in my urine.   I don't understand that. Symptoms: severe burning and nausea Frequency: Burning is constant but worse when urinating. Pertinent Negatives: Patient denies fever now but thinks she had some last night Disposition: '[]'$ ED /'[]'$ Urgent Care (no appt availability in office) / '[]'$ Appointment(In office/virtual)/ '[]'$  [facility]/ '[]'$ Home Care/ '[]'$ Refused Recommended Disposition /'[]'$ [place] [facility]/ '[x]'$  Follow-up with PCP Additional Notes: Message sent to [name] regarding her continued symptoms.  She is asking someone to call her back and let her know if they called in the Levaquin or what.  Send to [facility] on [address].

## 2021-12-21 ENCOUNTER — Ambulatory Visit: Payer: Medicare Other | Attending: Cardiovascular Disease | Admitting: Cardiovascular Disease

## 2021-12-21 ENCOUNTER — Encounter: Payer: Self-pay | Admitting: Cardiovascular Disease

## 2021-12-21 ENCOUNTER — Ambulatory Visit: Payer: Medicare Other | Admitting: Family Medicine

## 2021-12-21 VITALS — BP 100/60 | HR 85 | Ht 65.5 in | Wt 209.0 lb

## 2021-12-21 DIAGNOSIS — I340 Nonrheumatic mitral (valve) insufficiency: Secondary | ICD-10-CM

## 2021-12-21 DIAGNOSIS — G4733 Obstructive sleep apnea (adult) (pediatric): Secondary | ICD-10-CM

## 2021-12-21 DIAGNOSIS — I482 Chronic atrial fibrillation, unspecified: Secondary | ICD-10-CM

## 2021-12-21 DIAGNOSIS — R55 Syncope and collapse: Secondary | ICD-10-CM

## 2021-12-21 DIAGNOSIS — R0789 Other chest pain: Secondary | ICD-10-CM

## 2021-12-21 DIAGNOSIS — I25118 Atherosclerotic heart disease of native coronary artery with other forms of angina pectoris: Secondary | ICD-10-CM

## 2021-12-21 DIAGNOSIS — I739 Peripheral vascular disease, unspecified: Secondary | ICD-10-CM

## 2021-12-21 DIAGNOSIS — I5042 Chronic combined systolic (congestive) and diastolic (congestive) heart failure: Secondary | ICD-10-CM

## 2021-12-21 DIAGNOSIS — E782 Mixed hyperlipidemia: Secondary | ICD-10-CM

## 2021-12-21 NOTE — Patient Instructions (Signed)
Medication Instructions:  Extra torsemide 40 mg after lunch for weight gain, ABD swelling, shortness of breath No more than 2-3 timers a week  If you need a refill on your cardiac medications before your next appointment, please call your pharmacy.   Lab work: No new labs needed  Testing/Procedures: No new testing needed  Follow-Up: At [facility], you and your health needs are our priority.  As part of our continuing mission to provide you with exceptional heart care, we have created designated Provider Care Teams.  These Care Teams include your primary Cardiologist (physician) and Advanced Practice Providers (APPs -  Physician Assistants and Nurse Practitioners) who all work together to provide you with the care you need, when you need it.  You will need a follow up appointment in 12 months  Providers on your designated Care Team:   Murray Hodgkins, NP Christell Faith, PA-C Cadence Kathlen Mody, [place]  COVID-19 Vaccine Information can be found at: [email or web address] For questions related to vaccine distribution or appointments, please email [email or web address] .[email or web address] or call [phone].

## 2021-12-26 ENCOUNTER — Telehealth: Payer: Self-pay | Admitting: Urology

## 2021-12-26 NOTE — Telephone Encounter (Signed)
Pt wants a refill for Macrodantin sent in.  She says this works great for her and she needs to do the same thing she did years ago, but didn't know exactly what that was, but it worked.

## 2021-12-27 MED ORDER — NITROFURANTOIN MONOHYD MACRO 100 MG PO CAPS: 100.0000 mg | ORAL_CAPSULE | Freq: Two times a day (BID) | ORAL | 2 refills | Status: DC

## 2021-12-27 NOTE — Telephone Encounter (Signed)
Patient called this morning and stated that she experiences burning and pain unless she is taking medication continuously. She said that within 3 days of stopping medication, she has symptoms again. She wants to schedule an appointment to discuss how to treat her condition on an ongoing basis. She wanted to schedule an appt with Dr. Matilde Sprang before Christmas, but there are no appts available, so she would like an appointment with a PA. Would it be ok to schedule with Sam or Larene Beach? She said to please leave detailed message on voicemail if she is not home.

## 2021-12-27 NOTE — Telephone Encounter (Signed)
Spoke with patient and advised results rx sent to pharmacy by e-script  

## 2021-12-28 ENCOUNTER — Ambulatory Visit (INDEPENDENT_AMBULATORY_CARE_PROVIDER_SITE_OTHER): Payer: Medicare Other | Admitting: Physician Assistant

## 2021-12-28 DIAGNOSIS — I482 Chronic atrial fibrillation, unspecified: Secondary | ICD-10-CM

## 2021-12-28 LAB — POCT INR
INR: 7.1 — AB (ref 2.0–3.0)
PT: 85.1

## 2021-12-28 NOTE — Patient Instructions (Addendum)
  Description   Stop taking, return in 5 days for recheck of PT/INR

## 2021-12-30 NOTE — Progress Notes (Unsigned)
Established patient visit   Patient: Tracy Mann   DOB: 03/20/1933   88 y.o. Female  MRN: [id number] Visit Date: 01/02/2022  Today's healthcare provider: Alfredia Ferguson, PA-C   No chief complaint on file.  Subjective    HPI Pt here for Coumadin Clinic.  Medications: Outpatient Medications Prior to Visit  Medication Sig   albuterol (VENTOLIN HFA) 108 (90 Base) MCG/ACT inhaler TAKE 2 PUFFS BY MOUTH EVERY 6 HOURS AS NEEDED FOR WHEEZE OR SHORTNESS OF BREATH   ezetimibe (ZETIA) 10 MG tablet Take 1 tablet (10 mg total) by mouth daily.   flavoxATE (URISPAS) 100 MG tablet Take 1 tablet (100 mg total) by mouth 2 (two) times daily as needed for bladder spasms.   fluticasone (FLONASE) 50 MCG/ACT nasal spray Place 2 sprays into both nostrils daily.   HYDROcodone-acetaminophen (NORCO) 10-325 MG tablet Take 1 tablet by mouth every 8 (eight) hours as needed for severe pain. Must last 30 days.   HYDROcodone-acetaminophen (NORCO) 10-325 MG tablet Take 1 tablet by mouth every 8 (eight) hours as needed for severe pain. Must last 30 days. (Patient not taking: Reported on 12/21/2021)   HYDROcodone-acetaminophen (NORCO) 10-325 MG tablet Take 1 tablet by mouth every 8 (eight) hours as needed for severe pain. Must last 30 days. (Patient not taking: Reported on 12/21/2021)   HYDROCORTISONE, TOPICAL, 2 % LOTN Use topically BID for only 6 days   insulin glargine (LANTUS SOLOSTAR) 100 UNIT/ML Solostar Pen Inject 24 Units into the skin at bedtime.   insulin lispro (HUMALOG) 100 UNIT/ML KwikPen Inject 10-25 Units into the skin in the morning, at noon, and at bedtime.   Insulin Pen Needle (RELION PEN NEEDLE 31G/8MM) 31G X 8 MM MISC Use with insulin pen three times daily   levothyroxine (SYNTHROID) 25 MCG tablet Take 25 mcg by mouth daily before breakfast.   lisinopril (ZESTRIL) 10 MG tablet Take 1 tablet (10 mg total) by mouth daily.   LORazepam (ATIVAN) 1 MG tablet TAKE 1 TABLET(1 MG) BY MOUTH TWICE DAILY  AS NEEDED   Magnesium 500 MG CAPS Take 1,000 mg by mouth daily.    metolazone (ZAROXOLYN) 2.5 MG tablet Take 1 tablet (2.5 mg total) by mouth as directed. Take two times a week with torsemide 40 mg   nitrofurantoin, macrocrystal-monohydrate, (MACROBID) 100 MG capsule Take 1 capsule (100 mg total) by mouth every 12 (twelve) hours.   nitroGLYCERIN (NITROSTAT) 0.4 MG SL tablet Place 1 tablet (0.4 mg total) under the tongue every 5 (five) minutes as needed for chest pain.   NONFORMULARY OR COMPOUNDED ITEM See pharmacy note- Not to use on any open skin or wound.   ondansetron (ZOFRAN) 4 MG tablet Take 1 tablet (4 mg total) by mouth every 12 (twelve) hours as needed for nausea or vomiting.   pantoprazole (PROTONIX) 40 MG tablet Take 1 tablet (40 mg total) by mouth daily.   potassium chloride SA (KLOR-CON) 20 MEQ tablet Take 1 tablet (20 mEq total) by mouth daily.   rOPINIRole (REQUIP) 0.25 MG tablet TAKE 1 TABLET BY MOUTH EVERYDAY AT BEDTIME   torsemide (DEMADEX) 20 MG tablet Take 2 tablets (40 mg total) by mouth daily.   warfarin (COUMADIN) 4 MG tablet Take daily or as directed by physician   No facility-administered medications prior to visit.    Review of Systems  {Labs  Heme  Chem  Endocrine  Serology  Results Review (optional):23779}   Objective    There were no vitals  taken for this visit. {Show previous vital signs (optional):23777}  Physical Exam  ***  No results found for any visits on 01/02/22.  Assessment & Plan     ***  No follow-ups on file.      {provider attestation***:1}   Alfredia Ferguson, PA-C  [facility] [phone] (phone) [phone] (fax)  [facility]

## 2022-01-02 ENCOUNTER — Ambulatory Visit: Payer: Medicare Other | Admitting: Physician Assistant

## 2022-01-02 DIAGNOSIS — I482 Chronic atrial fibrillation, unspecified: Secondary | ICD-10-CM | POA: Diagnosis not present

## 2022-01-03 LAB — POCT INR: INR: 1.7 — AB (ref 2.0–3.0)

## 2022-01-03 NOTE — Patient Instructions (Addendum)
Description   Take 3 mg Monday, Wednesday and Friday. On Sunday start back taking 4 mg qd and return Monday

## 2022-01-05 ENCOUNTER — Telehealth: Payer: Self-pay | Admitting: Urology

## 2022-01-05 ENCOUNTER — Other Ambulatory Visit: Payer: Self-pay

## 2022-01-05 NOTE — Telephone Encounter (Signed)
Called pt, gave her number to radiology. Per the active request tab in appt desk, they have already tried to schedule pt and pt declined and said she would call back to schedule. Pt states she does not have a renal mass and doesn't understand why she needs a RUS. Advised pt she would need to call radiology.

## 2022-01-05 NOTE — Telephone Encounter (Signed)
Would you call Mrs. Begin and let her know that she is overdue for a 8-monthrenal ultrasound for surveillance of a left renal mass and get her scheduled?

## 2022-01-06 ENCOUNTER — Telehealth: Payer: Self-pay | Admitting: Physician Assistant

## 2022-01-06 DIAGNOSIS — J309 Allergic rhinitis, unspecified: Secondary | ICD-10-CM

## 2022-01-06 MED ORDER — FLUTICASONE PROPIONATE 50 MCG/ACT NA SUSP: 2.0000 | Freq: Every day | NASAL | 3 refills | Status: AC

## 2022-01-06 NOTE — Telephone Encounter (Signed)
  fluticasone (FLONASE) 50 MCG/ACT nasal

## 2022-01-06 NOTE — Telephone Encounter (Addendum)
[place] faxed refill request for the following medications:   fluticasone (FLONASE) 50 MCG/ACT nasal spray   Please advise.

## 2022-01-09 ENCOUNTER — Ambulatory Visit (INDEPENDENT_AMBULATORY_CARE_PROVIDER_SITE_OTHER): Payer: Medicare Other | Admitting: Family Medicine

## 2022-01-09 DIAGNOSIS — I482 Chronic atrial fibrillation, unspecified: Secondary | ICD-10-CM | POA: Diagnosis not present

## 2022-01-09 LAB — POCT INR
INR: 1.9 — AB (ref 2.0–3.0)
PT: 31

## 2022-01-09 NOTE — Patient Instructions (Addendum)
Description   Take '4mg'$  QD, return in 3 weeks.

## 2022-01-10 ENCOUNTER — Ambulatory Visit
Admission: RE | Admit: 2022-01-10 | Discharge: 2022-01-10 | Disposition: A | Discharge status: Home / Self Care | Payer: Medicare Other | Source: Ambulatory Visit | Attending: Gastroenterology | Admitting: Gastroenterology

## 2022-01-10 ENCOUNTER — Ambulatory Visit (INDEPENDENT_AMBULATORY_CARE_PROVIDER_SITE_OTHER): Payer: Medicare Other | Admitting: Gastroenterology

## 2022-01-10 ENCOUNTER — Encounter: Payer: Self-pay | Admitting: Gastroenterology

## 2022-01-10 VITALS — BP 92/54 | HR 57 | Temp 98.1°F | Ht 65.5 in | Wt 212.1 lb

## 2022-01-10 DIAGNOSIS — K5909 Other constipation: Secondary | ICD-10-CM

## 2022-01-10 DIAGNOSIS — R140 Abdominal distension (gaseous): Secondary | ICD-10-CM | POA: Diagnosis not present

## 2022-01-10 MED ORDER — POLYETHYLENE GLYCOL 3350 17 GM/SCOOP PO POWD: 1.0000 | Freq: Every day | ORAL | 0 refills | Status: DC

## 2022-01-10 NOTE — Progress Notes (Signed)
Cephas Darby, MD [address]  [place]  [place], [place] [address]  Main: [phone]  Fax: [phone]    Gastroenterology Consultation  Referring Provider:     [name], PA-C Primary Care Physician:  [name], PA-C Primary Gastroenterologist:  Dr. Cephas Darby Reason for Consultation: Chronic idiopathic constipation        HPI:   Tracy Mann is a 88 y.o. female referred by  [name], PA-C  for consultation & management of generalized abdominal distention, patient feeds gassy, incomplete emptying, associated with straining, has small amounts of stool at the time and has urge to go after a meal, stool is hard.  She takes magnesium pill daily to move her bowels.  She reports not having a bowel movement without taking magnesium pills.  She also reports pelvic pressure.  She denies any rectal bleeding.  She opted not to undergo colonoscopy in her lifetime.  She does admit to snacking all day, does not feel hungry to have a meal.  Limited amount of natural fiber intake.  On Coumadin for history of A-fib.  Admits to drinking ginger ale and water about 8 cups daily  NSAIDs: None  Antiplts/Anticoagulants/Anti thrombotics: Coumadin  GI Procedures: None  Past Medical History:  Diagnosis Date   Acute myocardial infarction of inferior wall ([place])    a. 10/2005 - Presumed to be 2/2 to a coronary embolus in setting of persistent Afib-->Cath 2014 relatively nl cors, EF 45-50% w/ severe distal inferior/apical inferior HK w/ aneursymal appearance.   Arthritis    Chronic anticoagulation    a. warfarin.   Chronic atrial fibrillation ([facility])    a. CHA2DS2VASc = 6--> warfarin.   Chronic combined systolic and diastolic CHF (congestive heart failure) ([place])    a. 10/2012 Echo: EF 35-40%, diff HK, mild MR, mild biatrial enlargement;  b. 11/2012 EF 45-50% by LV gram; c. echo 07/2014: EF 40-45%, mod conc LVH, mod MR, severe biatrial enlargement, PASP 45 mm Hg c. Echo  01/2018 EF 45-50%, duffuse hypokinesis, mild MR, LA mod dilated, norm RVSF, dilated IVC   Essential hypertension    Hyperlipidemia, mixed    Mixed Ischemic/Nonischemic Cardiomyopathy    a. 10/2012 Echo: EF 35-40%;  b. 11/2012 EF 45-50% by LV gram.   Nonobstructive coronary artery disease    a. 2007 inferior wall MI thought to be secondary to thrombus from atrial fib b. 2014 cath with R dominant coronary arterial system with mild luminal irregularities c. Myoview 05/2015 old inferior MI, no new ischemic changes   Obesity    Thyroid disease    hypothyroidism   Type II diabetes mellitus ([place])    a. 07/2018 A1c 8.7 - long term insulin use   Venous insufficiency    Vitamin D deficiency     Past Surgical History:  Procedure Laterality Date   CARDIAC CATHETERIZATION  11/2012   [facility];EF 45-50%   CHOLECYSTECTOMY  1999   TUBAL LIGATION  1968   VESICOVAGINAL FISTULA CLOSURE W/ TAH  1996     Current Outpatient Medications:    albuterol (VENTOLIN HFA) 108 (90 Base) MCG/ACT inhaler, TAKE 2 PUFFS BY MOUTH EVERY 6 HOURS AS NEEDED FOR WHEEZE OR SHORTNESS OF BREATH, Disp: 8.5 each, Rfl: 2   ezetimibe (ZETIA) 10 MG tablet, Take 1 tablet (10 mg total) by mouth daily., Disp: 90 tablet, Rfl: 2   flavoxATE (URISPAS) 100 MG tablet, Take 1 tablet (100 mg total) by mouth 2 (two) times daily as  needed for bladder spasms., Disp: 60 tablet, Rfl: 11   fluticasone (FLONASE) 50 MCG/ACT nasal spray, Place 2 sprays into both nostrils daily., Disp: 48 mL, Rfl: 3   HYDROcodone-acetaminophen (NORCO) 10-325 MG tablet, Take 1 tablet by mouth every 8 (eight) hours as needed for severe pain. Must last 30 days., Disp: 60 tablet, Rfl: 0   insulin glargine (LANTUS SOLOSTAR) 100 UNIT/ML Solostar Pen, Inject 24 Units into the skin at bedtime., Disp: , Rfl:    insulin lispro (HUMALOG) 100 UNIT/ML KwikPen, Inject 10-25 Units into the skin in the morning, at noon, and at bedtime., Disp: , Rfl:    Insulin Pen Needle (RELION PEN NEEDLE  31G/8MM) 31G X 8 MM MISC, Use with insulin pen three times daily, Disp: 100 each, Rfl: 12   levothyroxine (SYNTHROID) 25 MCG tablet, Take 25 mcg by mouth daily before breakfast., Disp: , Rfl:    lisinopril (ZESTRIL) 10 MG tablet, Take 1 tablet (10 mg total) by mouth daily., Disp: 90 tablet, Rfl: 3   LORazepam (ATIVAN) 1 MG tablet, TAKE 1 TABLET(1 MG) BY MOUTH TWICE DAILY AS NEEDED, Disp: 60 tablet, Rfl: 5   Magnesium 500 MG CAPS, Take 1,000 mg by mouth daily. , Disp: , Rfl:    metolazone (ZAROXOLYN) 2.5 MG tablet, Take 1 tablet (2.5 mg total) by mouth as directed. Take two times a week with torsemide 40 mg, Disp: 90 tablet, Rfl: 1   nitroGLYCERIN (NITROSTAT) 0.4 MG SL tablet, Place 1 tablet (0.4 mg total) under the tongue every 5 (five) minutes as needed for chest pain., Disp: 25 tablet, Rfl: 1   NONFORMULARY OR COMPOUNDED ITEM, See pharmacy note- Not to use on any open skin or wound., Disp: 120 each, Rfl: 3   ondansetron (ZOFRAN) 4 MG tablet, Take 1 tablet (4 mg total) by mouth every 12 (twelve) hours as needed for nausea or vomiting., Disp: 60 tablet, Rfl: 2   polyethylene glycol powder (GLYCOLAX/MIRALAX) 17 GM/SCOOP powder, Take 255 g by mouth daily., Disp: 238 g, Rfl: 0   potassium chloride SA (KLOR-CON) 20 MEQ tablet, Take 1 tablet (20 mEq total) by mouth daily., Disp: 90 tablet, Rfl: 3   torsemide (DEMADEX) 20 MG tablet, Take 2 tablets (40 mg total) by mouth daily., Disp: 60 tablet, Rfl: 6   warfarin (COUMADIN) 4 MG tablet, Take daily or as directed by physician, Disp: 90 tablet, Rfl: 4   rOPINIRole (REQUIP) 0.25 MG tablet, TAKE 1 TABLET BY MOUTH EVERYDAY AT BEDTIME, Disp: 90 tablet, Rfl: 1   Family History  Problem Relation Age of Onset   Heart disease Mother    Thyroid disease Father      Social History   Tobacco Use   Smoking status: Never   Smokeless tobacco: Never  Vaping Use   Vaping Use: Never used  Substance Use Topics   Alcohol use: No   Drug use: No    Allergies as  of 01/10/2022 - Review Complete 01/10/2022  Allergen Reaction Noted   Duloxetine Other (See Comments) 11/13/2019   Paroxetine Other (See Comments) 11/13/2019   Sulfa antibiotics Anaphylaxis 07/15/2012   Cephalexin Other (See Comments) 04/28/2019   Clarithromycin Other (See Comments) 09/09/2013   Diphenhydramine Other (See Comments) 07/08/2014   Estrogens conjugated  07/08/2014   Nitrofurantoin Nausea Only 07/15/2012    Review of Systems:    All systems reviewed and negative except where noted in HPI.   Physical Exam:  BP (!) 92/54 (BP Location: Left Arm, Patient Position: Sitting, Cuff  Size: Large)   Pulse (!) 57   Temp 98.1 F (36.7 C) (Oral)   Ht 5' 5.5" (1.664 m)   Wt 212 lb 2 oz (96.2 kg)   BMI 34.76 kg/m  No LMP recorded. Patient has had a hysterectomy.  General:   Alert,  Well-developed, well-nourished, pleasant and cooperative in NAD Head:  Normocephalic and atraumatic. Eyes:  Sclera clear, no icterus.   Conjunctiva pink. Ears:  Normal auditory acuity. Nose:  No deformity, discharge, or lesions. Mouth:  No deformity or lesions,oropharynx pink & moist. Neck:  Supple; no masses or thyromegaly. Lungs:  Respirations even and unlabored.  Clear throughout to auscultation.   No wheezes, crackles, or rhonchi. No acute distress. Heart:  Regular rate and rhythm; no murmurs, clicks, rubs, or gallops. Abdomen:  Normal bowel sounds. Soft, non-tender and non-distended without masses, hepatosplenomegaly or hernias noted.  No guarding or rebound tenderness.   Rectal: Normal perianal skin, no evidence of skin tags Msk:  Symmetrical without gross deformities. Good, equal movement & strength bilaterally. Pulses:  Normal pulses noted. Extremities:  No clubbing or edema.  No cyanosis. Neurologic:  Alert and oriented x3;  grossly normal neurologically. Skin:  Intact without significant lesions or rashes. No jaundice. Psych:  Alert and cooperative. Normal mood and affect.  Imaging  Studies: Reviewed  Assessment and Plan:   Tracy Mann is a 88 y.o. female with metabolic syndrome, coronary artery disease, A-fib on Coumadin is seen in consultation for generalized abdominal gaseous distention, irregular bowel movements, dependent on laxative.  Chronic idiopathic constipation with gaseous distention Recommend KUB Her diet is devoid of fiber, read dated on high-fiber intake, information provided Recommend MiraLAX bowel prep for cleanout Discussed about avoiding laxative use including magnesium pills.  Start MiraLAX 1-2 capfuls daily Patient is reluctant to undergo colonoscopy  Follow up as needed   Cephas Darby, MD

## 2022-01-10 NOTE — Patient Instructions (Signed)
Please go to the medical mall for your KUB xray.  Please use a Miralax prep clean out. Mix 32 ounces of Gatorade with 238 grams of miralax. Drink 8oz every 20 minutes till solution is gone.   1 day after miralax clean out is taken start taking Miralax 1 to 2 cupful daily with a large glass of water.   High-Fiber Eating Plan Fiber, also called dietary fiber, is a type of carbohydrate. It is found foods such as fruits, vegetables, whole grains, and beans. A high-fiber diet can have many health benefits. Your health care provider may recommend a high-fiber diet to help: Prevent constipation. Fiber can make your bowel movements more regular. Lower your cholesterol. Relieve the following conditions: Inflammation of veins in the anus (hemorrhoids). Inflammation of specific areas of the digestive tract (uncomplicated diverticulosis). A problem of the large intestine, also called the colon, that sometimes causes pain and diarrhea (irritable bowel syndrome, or IBS). Prevent overeating as part of a weight-loss plan. Prevent heart disease, type 2 diabetes, and certain cancers. What are tips for following this plan? Reading food labels  Check the nutrition facts label on food products for the amount of dietary fiber. Choose foods that have 5 grams of fiber or more per serving. The goals for recommended daily fiber intake include: Men (age 12 or younger): 34-38 g. Men (over age 46): 28-34 g. Women (age 41 or younger): 25-28 g. Women (over age 35): 22-25 g. Your daily fiber goal is _____________ g. Shopping Choose whole fruits and vegetables instead of processed forms, such as apple juice or applesauce. Choose a wide variety of high-fiber foods such as avocados, lentils, oats, and kidney beans. Read the nutrition facts label of the foods you choose. Be aware of foods with added fiber. These foods often have high sugar and sodium amounts per serving. Cooking Use whole-grain flour for baking and  cooking. Cook with brown rice instead of white rice. Meal planning Start the day with a breakfast that is high in fiber, such as a cereal that contains 5 g of fiber or more per serving. Eat breads and cereals that are made with whole-grain flour instead of refined flour or white flour. Eat brown rice, bulgur wheat, or millet instead of white rice. Use beans in place of meat in soups, salads, and pasta dishes. Be sure that half of the grains you eat each day are whole grains. General information You can get the recommended daily intake of dietary fiber by: Eating a variety of fruits, vegetables, grains, nuts, and beans. Taking a fiber supplement if you are not able to take in enough fiber in your diet. It is better to get fiber through food than from a supplement. Gradually increase how much fiber you consume. If you increase your intake of dietary fiber too quickly, you may have bloating, cramping, or gas. Drink plenty of water to help you digest fiber. Choose high-fiber snacks, such as berries, raw vegetables, nuts, and popcorn. What foods should I eat? Fruits Berries. Pears. Apples. Oranges. Avocado. Prunes and raisins. Dried figs. Vegetables Sweet potatoes. Spinach. Kale. Artichokes. Cabbage. Broccoli. Cauliflower. Green peas. Carrots. Squash. Grains Whole-grain breads. Multigrain cereal. Oats and oatmeal. Brown rice. Barley. Bulgur wheat. [place]. Quinoa. Bran muffins. Popcorn. Rye wafer crackers. Meats and other proteins Navy beans, kidney beans, and pinto beans. Soybeans. Split peas. Lentils. Nuts and seeds. Dairy Fiber-fortified yogurt. Beverages Fiber-fortified soy milk. Fiber-fortified orange juice. Other foods Fiber bars. The items listed above may not be a  complete list of recommended foods and beverages. Contact a dietitian for more information. What foods should I avoid? Fruits Fruit juice. Cooked, strained fruit. Vegetables Fried potatoes. Canned vegetables.  Well-cooked vegetables. Grains White bread. Pasta made with refined flour. White rice. Meats and other proteins Fatty cuts of meat. Fried chicken or fried fish. Dairy Milk. Yogurt. Cream cheese. Sour cream. Fats and oils Butters. Beverages Soft drinks. Other foods Cakes and pastries. The items listed above may not be a complete list of foods and beverages to avoid. Talk with your dietitian about what choices are best for you. Summary Fiber is a type of carbohydrate. It is found in foods such as fruits, vegetables, whole grains, and beans. A high-fiber diet has many benefits. It can help to prevent constipation, lower blood cholesterol, aid weight loss, and reduce your risk of heart disease, diabetes, and certain cancers. Increase your intake of fiber gradually. Increasing fiber too quickly may cause cramping, bloating, and gas. Drink plenty of water while you increase the amount of fiber you consume. The best sources of fiber include whole fruits and vegetables, whole grains, nuts, seeds, and beans. This information is not intended to replace advice given to you by your health care provider. Make sure you discuss any questions you have with your health care provider. Document Revised: 05/22/2019 Document Reviewed: 05/22/2019 Elsevier Patient Education  [place].

## 2022-01-11 ENCOUNTER — Telehealth: Payer: Self-pay

## 2022-01-11 NOTE — Telephone Encounter (Signed)
-----   Message from Lin Landsman, MD sent at 01/11/2022  4:41 PM EST ----- Please inform patient that her abdominal x-ray came back normal except for mild amount of stool throughout the colon  RV

## 2022-01-11 NOTE — Telephone Encounter (Signed)
Called and patient verbalized understanding of results  °

## 2022-01-15 NOTE — Progress Notes (Unsigned)
PROVIDER NOTE: Information contained herein reflects review and annotations entered in association with encounter. Interpretation of such information and data should be left to medically-trained personnel. Information provided to patient can be located elsewhere in the medical record under "Patient Instructions". Document created using STT-dictation technology, any transcriptional errors that may result from process are unintentional.    Patient: Tracy Mann  Service Category: E/M  Provider: Gaspar Cola, MD  DOB: [birth year]  DOS: 01/16/2022  Referring Provider: Jerrol Banana.,*  MRN: [id number]  Specialty: Interventional Pain Management  PCP: [name], PA-C  Type: Established Patient  Setting: Ambulatory outpatient    Location: Office  Delivery: Face-to-face     HPI  Tracy Mann, a 88 y.o. year old female, is here today because of her No primary diagnosis found.. Tracy Mann's primary complain today is No chief complaint on file. Last encounter: My last encounter with her was on 10/19/2021. Pertinent problems: Tracy Mann has Cervical muscle strain; DDD (degenerative disc disease), lumbosacral; Chronic venous insufficiency; Chronic pain syndrome; Abnormal MRI, lumbar spine (08/22/2019); Lumbar facet hypertrophy (Multilevel) (Bilateral); Lumbar central spinal stenosis (SEVERE) (L3-4) with neurogenic claudication; Grade 1 Anterolisthesis of lumbar spine (L4/L5); Lumbar facet arthropathy (L3-4 right-sided facet edema/joint effusion); Lumbar lateral recess stenosis (L4-5) (Right); Chronic low back pain (3ry area of Pain) (Bilateral) w/ sciatica (Bilateral); Lumbosacral radiculopathy at L5 (Bilateral); Chronic lower extremity pain (1ry area of Pain) (Bilateral); Lumbosacral facet syndrome; Chronic hip pain (2ry area of Pain) (Bilateral) (R>L); Osteoarthritis involving multiple joints; Osteoarthritis of knee (Right); Osteoarthritis of facet joint of lumbar spine; Osteoarthritis of lumbar  spine; Bilateral lower extremity edema; Cellulitis of right lower leg; Chronic knee pain (Bilateral); Peripheral vascular disease ([place]) (lower extremity) (Bilateral); Cervicalgia; Chronic upper back pain; and Lumbosacral spondylosis with radiculopathy on their pertinent problem list. Pain Assessment: Severity of   is reported as a  /10. Location:    / . Onset:  . Quality:  . Timing:  . Modifying factor(s):  Marland Kitchen Vitals:  vitals were not taken for this visit.  BMI: Estimated body mass index is 34.76 kg/m as calculated from the following:   Height as of 01/10/22: 5' 5.5" (1.664 m).   Weight as of 01/10/22: 212 lb 2 oz (96.2 kg).  Reason for encounter:  *** . ***  Pharmacotherapy Assessment  Analgesic: Hydrocodone/APAP 10/325, (see the 05/09/2021 note) using 1.675 pills/day. MME/day: 10-20 mg/day   Monitoring: [place] PMP: PDMP reviewed during this encounter.       Pharmacotherapy: No side-effects or adverse reactions reported. Compliance: No problems identified. Effectiveness: Clinically acceptable.  No notes on file  No results found for: "CBDTHCR" No results found for: "D8THCCBX" No results found for: "D9THCCBX"  UDS:  Summary  Date Value Ref Range Status  05/09/2021 Note  Final    Comment:    ==================================================================== [employer] [facility] (MW) ==================================================================== Test                             Result       Flag       Units  Drug Present and Declared for Prescription Verification   Lorazepam                      1824         EXPECTED   ng/mg creat    Source of lorazepam is a scheduled prescription medication.    Hydrocodone  2020         EXPECTED   ng/mg creat   Hydromorphone                  234          EXPECTED   ng/mg creat   Dihydrocodeine                 495          EXPECTED   ng/mg creat   Norhydrocodone                 2173         EXPECTED   ng/mg creat    Sources  of hydrocodone include scheduled prescription medications.    Hydromorphone, dihydrocodeine and norhydrocodone are expected    metabolites of hydrocodone. Hydromorphone and dihydrocodeine are    also available as scheduled prescription medications.  ==================================================================== Test                      Result    Flag   Units      Ref Range   Creatinine              80               mg/dL      >=20 ==================================================================== Declared Medications:  The flagging and interpretation on this report are based on the  following declared medications.  Unexpected results may arise from  inaccuracies in the declared medications.   **Note: The testing scope of this panel includes these medications:   Hydrocodone (Norco)  Lorazepam (Ativan)   **Note: The testing scope of this panel does not include the  following reported medications:   Acetaminophen (Norco)  Albuterol (Ventolin HFA)  Estradiol (Estrace)  Ezetimibe (Zetia)  Flavoxate (Urispas)  Fluticasone (Flonase)  Insulin (Lantus)  Levothyroxine (Synthroid)  Lisinopril (Zestril)  Magnesium  Metolazone  Metoprolol (Toprol)  Nitroglycerin (Nitrostat)  Potassium (Klor-Con)  Ropinirole (Requip)  Torsemide (Demadex)  Undefined Miscellaneous Drug  Warfarin (Coumadin) ==================================================================== For clinical consultation, please call [phone]. ====================================================================       ROS  Constitutional: Denies any fever or chills Gastrointestinal: No reported hemesis, hematochezia, vomiting, or acute GI distress Musculoskeletal: Denies any acute onset joint swelling, redness, loss of ROM, or weakness Neurological: No reported episodes of acute onset apraxia, aphasia, dysarthria, agnosia, amnesia, paralysis, loss of coordination, or loss of consciousness  Medication  Review  HYDROcodone-acetaminophen, Insulin Pen Needle, LORazepam, Magnesium, NONFORMULARY OR COMPOUNDED ITEM, albuterol, ezetimibe, flavoxATE, fluticasone, insulin glargine, insulin lispro, levothyroxine, lisinopril, metolazone, nitroGLYCERIN, ondansetron, polyethylene glycol powder, potassium chloride SA, rOPINIRole, torsemide, and warfarin  History Review  Allergy: Tracy Mann is allergic to duloxetine, paroxetine, sulfa antibiotics, cephalexin, clarithromycin, diphenhydramine, estrogens conjugated, and nitrofurantoin. Drug: Tracy Mann  reports no history of drug use. Alcohol:  reports no history of alcohol use. Tobacco:  reports that she has never smoked. She has never used smokeless tobacco. Social: Tracy Mann  reports that she has never smoked. She has never used smokeless tobacco. She reports that she does not drink alcohol and does not use drugs. Medical:  has a past medical history of Acute myocardial infarction of inferior wall ([place]), Arthritis, Chronic anticoagulation, Chronic atrial fibrillation ([place]), Chronic combined systolic and diastolic CHF (congestive heart failure) ([place]), Essential hypertension, Hyperlipidemia, mixed, Mixed Ischemic/Nonischemic Cardiomyopathy, Nonobstructive coronary artery disease, Obesity, Thyroid disease, Type II diabetes mellitus ([place]), Venous insufficiency, and Vitamin D deficiency. Surgical: Tracy Mann  has a past surgical history that includes Vesicovaginal fistula closure w/ TAH (1996); Tubal ligation (1968); Cholecystectomy (1999); and Cardiac catheterization (11/2012). Family: family history includes Heart disease in her mother; Thyroid disease in her father.  Laboratory Chemistry Profile   Renal Lab Results  Component Value Date   BUN 45 (H) 09/19/2021   CREATININE 1.41 (H) 09/19/2021   BCR 31 (H) 06/21/2021   GFRAA 73 12/31/2019   GFRNONAA 36 (L) 09/19/2021    Hepatic Lab Results  Component Value Date   AST 22 06/21/2021   ALT 10 06/21/2021   ALBUMIN  4.0 06/21/2021   ALKPHOS 124 (H) 06/21/2021    Electrolytes Lab Results  Component Value Date   NA 138 09/19/2021   K 5.0 09/19/2021   CL 104 09/19/2021   CALCIUM 9.1 09/19/2021   MG 2.1 11/26/2019   PHOS 3.9 09/08/2014    Bone Lab Results  Component Value Date   25OHVITD1 43 11/26/2019   25OHVITD2 9.2 11/26/2019   25OHVITD3 34 11/26/2019    Inflammation (CRP: Acute Phase) (ESR: Chronic Phase) Lab Results  Component Value Date   CRP 4 11/26/2019   ESRSEDRATE 45 (H) 11/26/2019   LATICACIDVEN 1.4 01/09/2020         Note: Above Lab results reviewed.  Recent Imaging Review  DG Abd 1 View CLINICAL DATA:  Chronic constipation, abdominal distention.  EXAM: ABDOMEN - 1 VIEW  COMPARISON:  May 05, 2019.  FINDINGS: Status post cholecystectomy. No abnormal bowel dilatation is noted. Mild amount of stool seen throughout the colon.  IMPRESSION: No abnormal bowel dilatation.  Electronically Signed   By: Marijo Conception M.D.   On: 01/11/2022 12:54 Note: Reviewed        Physical Exam  General appearance: Well nourished, well developed, and well hydrated. In no apparent acute distress Mental status: Alert, oriented x 3 (person, place, & time)       Respiratory: No evidence of acute respiratory distress Eyes: PERLA Vitals: There were no vitals taken for this visit. BMI: Estimated body mass index is 34.76 kg/m as calculated from the following:   Height as of 01/10/22: 5' 5.5" (1.664 m).   Weight as of 01/10/22: 212 lb 2 oz (96.2 kg). Ideal: Ideal body weight: 58.2 kg (128 lb 3.2 oz) Adjusted ideal body weight: 73.4 kg (161 lb 12.3 oz)  Assessment   Diagnosis Status  No diagnosis found. Controlled Controlled Controlled   Updated Problems: No problems updated.  Plan of Care  Problem-specific:  No problem-specific Assessment & Plan notes found for this encounter.  Tracy Mann has a current medication list which includes the following long-term  medication(s): albuterol, ezetimibe, fluticasone, hydrocodone-acetaminophen, insulin lispro, levothyroxine, lisinopril, metolazone, nitroglycerin, potassium chloride sa, ropinirole, torsemide, and warfarin.  Pharmacotherapy (Medications Ordered): No orders of the defined types were placed in this encounter.  Orders:  No orders of the defined types were placed in this encounter.  Follow-up plan:   No follow-ups on file.     Interventional Therapies  Risk  Complexity Considerations:   Estimated body mass index is 35.73 kg/m as calculated from the following:   Height as of this encounter: 5' 5.5" (1.664 m).   Weight as of this encounter: 218 lb (98.9 kg). NOTE: COUMADIN Anticoagulation (Stop: 5 days  Restart: 2 hours) Decreased GFR Obstructive sleep apnea   Planned  Pending:      Under consideration:   Diagnostic midline caudal ESI #1 + diagnostic epidurogram  Diagnostic bilateral  IA hip joint injection #1  Diagnostic right L3-4 LESI #1  Diagnostic right L4 TFESI #1  Diagnostic bilateral L3 TFESI #1  Diagnostic bilateral lumbar facet MBB #1    Completed:   Diagnostic/therapeutic midline L4-5 LESI x2 (08/04/2021) (100/100/100/100)    Completed by Dr. Alba Destine [facility]):   Diagnostic bilateral L4 TFESI x1 (09/12/2019 - Dr. Alba Destine [[facility]])    Therapeutic  Palliative (PRN) options:   Diagnostic/therapeutic midline L4-5 LESI      Recent Visits Date Type Provider Dept  10/19/21 Office Visit Milinda Pointer, MD [facility]  Showing recent visits within past 90 days and meeting all other requirements Future Appointments Date Type Provider Dept  01/16/22 Appointment Milinda Pointer, MD [facility]  Showing future appointments within next 90 days and meeting all other requirements  I discussed the assessment and treatment plan with the patient. The patient was provided an opportunity to ask questions and all were answered. The patient agreed with the plan  and demonstrated an understanding of the instructions.  Patient advised to call back or seek an in-person evaluation if the symptoms or condition worsens.  Duration of encounter: *** minutes.  Total time on encounter, as per AMA guidelines included both the face-to-face and non-face-to-face time personally spent by the physician and/or other qualified health care professional(s) on the day of the encounter (includes time in activities that require the physician or other qualified health care professional and does not include time in activities normally performed by clinical staff). Physician's time may include the following activities when performed: preparing to see the patient (eg, review of tests, pre-charting review of records) obtaining and/or reviewing separately obtained history performing a medically appropriate examination and/or evaluation counseling and educating the patient/family/caregiver ordering medications, tests, or procedures referring and communicating with other health care professionals (when not separately reported) documenting clinical information in the electronic or other health record independently interpreting results (not separately reported) and communicating results to the patient/ family/caregiver care coordination (not separately reported)  Note by: Gaspar Cola, MD Date: 01/16/2022; Time: 10:12 AM

## 2022-01-16 ENCOUNTER — Encounter: Payer: Self-pay | Admitting: Pain Medicine

## 2022-01-16 ENCOUNTER — Ambulatory Visit: Payer: Medicare Other | Attending: Pain Medicine | Admitting: Pain Medicine

## 2022-01-16 VITALS — BP 126/68 | HR 87 | Temp 97.2°F | Ht 65.0 in | Wt 207.0 lb

## 2022-01-16 DIAGNOSIS — M79604 Pain in right leg: Secondary | ICD-10-CM | POA: Insufficient documentation

## 2022-01-16 DIAGNOSIS — M25562 Pain in left knee: Secondary | ICD-10-CM | POA: Insufficient documentation

## 2022-01-16 DIAGNOSIS — M25561 Pain in right knee: Secondary | ICD-10-CM | POA: Insufficient documentation

## 2022-01-16 DIAGNOSIS — G894 Chronic pain syndrome: Secondary | ICD-10-CM | POA: Insufficient documentation

## 2022-01-16 DIAGNOSIS — M5441 Lumbago with sciatica, right side: Secondary | ICD-10-CM | POA: Insufficient documentation

## 2022-01-16 DIAGNOSIS — M5442 Lumbago with sciatica, left side: Secondary | ICD-10-CM | POA: Insufficient documentation

## 2022-01-16 DIAGNOSIS — M25551 Pain in right hip: Secondary | ICD-10-CM | POA: Diagnosis present

## 2022-01-16 DIAGNOSIS — Z79899 Other long term (current) drug therapy: Secondary | ICD-10-CM | POA: Diagnosis present

## 2022-01-16 DIAGNOSIS — M549 Dorsalgia, unspecified: Secondary | ICD-10-CM | POA: Insufficient documentation

## 2022-01-16 DIAGNOSIS — G8929 Other chronic pain: Secondary | ICD-10-CM | POA: Insufficient documentation

## 2022-01-16 DIAGNOSIS — Z79891 Long term (current) use of opiate analgesic: Secondary | ICD-10-CM | POA: Insufficient documentation

## 2022-01-16 DIAGNOSIS — M79605 Pain in left leg: Secondary | ICD-10-CM | POA: Insufficient documentation

## 2022-01-16 DIAGNOSIS — M25552 Pain in left hip: Secondary | ICD-10-CM | POA: Insufficient documentation

## 2022-01-16 DIAGNOSIS — M47817 Spondylosis without myelopathy or radiculopathy, lumbosacral region: Secondary | ICD-10-CM | POA: Diagnosis present

## 2022-01-16 MED ORDER — HYDROCODONE-ACETAMINOPHEN 10-325 MG PO TABS: 1.0000 | ORAL_TABLET | Freq: Three times a day (TID) | ORAL | 0 refills | Status: DC | PRN

## 2022-01-16 MED ORDER — NALOXONE HCL 4 MG/0.1ML NA LIQD: 1.0000 | NASAL | 0 refills | Status: DC | PRN

## 2022-01-16 NOTE — Progress Notes (Signed)
Nursing Pain Medication Assessment:  Safety precautions to be maintained throughout the outpatient stay will include: orient to surroundings, keep bed in low position, maintain call bell within reach at all times, provide assistance with transfer out of bed and ambulation.  Medication Inspection Compliance: Pill count conducted under aseptic conditions, in front of the patient. Neither the pills nor the bottle was removed from the patient's sight at any time. Once count was completed pills were immediately returned to the patient in their original bottle.  Medication: Oxycodone/APAP Pill/Patch Count:  6 0 Pill/Patch Appearance: Markings consistent with prescribed medication Bottle Appearance: Standard pharmacy container. Clearly labeled. Filled Date: 52 / 28 / 2023 Last Medication intake:  Today

## 2022-01-16 NOTE — Patient Instructions (Signed)
____________________________________________________________________________________________  Patient Information update  To: All of our patients.  Re: Name change.  It has been made official that our current name, "[facility]"   will soon be changed to "[facility]".   The purpose of this change is to eliminate any confusion created by the concept of our practice being a "Medication Management Pain Clinic". In the past this has led to the misconception that we treat pain primarily by the use of prescription medications.  Nothing can be farther from the truth.   Understanding PAIN MANAGEMENT: To further understand what our practice does, you first have to understand that "Pain Management" is a subspecialty that requires additional training once a physician has completed their specialty training, which can be in either Anesthesia, Neurology, Psychiatry, or Physical Medicine and Rehabilitation (PMR). Each one of these contributes to the final approach taken by each physician to the management of their patient's pain. To be a "Pain Management Specialist" you must have first completed one of the specialty trainings below.  Anesthesiologists - trained in clinical pharmacology and interventional techniques such as nerve blockade and regional as well as central neuroanatomy. They are trained to block pain before, during, and after surgical interventions.  Neurologists - trained in the diagnosis and pharmacological treatment of complex neurological conditions, such as Multiple Sclerosis, Parkinson's, spinal cord injuries, and other systemic conditions that may be associated with symptoms that may include but are not limited to pain. They tend to rely primarily on the treatment of chronic pain using prescription medications.  Psychiatrist - trained in conditions affecting the psychosocial  wellbeing of patients including but not limited to depression, anxiety, schizophrenia, personality disorders, addiction, and other substance use disorders that may be associated with chronic pain. They tend to rely primarily on the treatment of chronic pain using prescription medications.   Physical Medicine and Rehabilitation (PMR) physicians, also known as physiatrists - trained to treat a wide variety of medical conditions affecting the brain, spinal cord, nerves, bones, joints, ligaments, muscles, and tendons. Their training is primarily aimed at treating patients that have suffered injuries that have caused severe physical impairment. Their training is primarily aimed at the physical therapy and rehabilitation of those patients. They may also work alongside orthopedic surgeons or neurosurgeons using their expertise in assisting surgical patients to recover after their surgeries.  INTERVENTIONAL PAIN MANAGEMENT is sub-subspecialty of Pain Management.  Our physicians are Board-certified in Anesthesia, Pain Management, and Interventional Pain Management.  This meaning that not only have they been trained and Board-certified in their specialty of Anesthesia, and subspecialty of Pain Management, but they have also received further training in the sub-subspecialty of Interventional Pain Management, in order to become Board-certified as INTERVENTIONAL PAIN MANAGEMENT SPECIALIST.    Mission: Our goal is to use our skills in  INTERVENTIONAL PAIN MANAGEMENT as alternatives to the chronic use of prescription opioid medications for the treatment of pain. To make this more clear, we have changed our name to reflect what we do and offer. We will continue to offer medication management assessment and recommendations, but we will not be taking over any patient's medication management.  ____________________________________________________________________________________________      ____________________________________________________________________________________________  National Pain Medication Shortage  The [place] is experiencing worsening drug shortages. These have had a negative widespread effect on patient care and treatment. Not expected to improve any time soon. Predicted to last past 2029.   Drug shortage list (generic   names) Oxycodone IR Oxycodone/APAP Oxymorphone IR Hydromorphone Hydrocodone/APAP Morphine  Where is the problem?  Manufacturing and supply level.  Will this shortage affect you?  Only if you take any of the above pain medications.  How? You may be unable to fill your prescription.  Your pharmacist may offer a "partial fill" of your prescription. (Warning: Do not accept partial fills.) Prescriptions partially filled cannot be transferred to another pharmacy. Read our Medication Rules and Regulation. Depending on how much medicine you are dependent on, you may experience withdrawals when unable to get the medication.  Recommendations: Consider ending your dependence on opioid pain medications. Ask your pain specialist to assist you with the process. Consider switching to a medication currently not in shortage, such as Buprenorphine. Talk to your pain specialist about this option. Consider decreasing your pain medication requirements by managing tolerance thru "Drug Holidays". This may help minimize withdrawals, should you run out of medicine. Control your pain thru the use of non-pharmacological interventional therapies.   Your prescriber: Prescribers cannot be blamed for shortages. Medication manufacturing and supply issues cannot be fixed by the prescriber.   NOTE: The prescriber is not responsible for supplying the medication, or solving supply issues. Work with your pharmacist to solve it. The patient is responsible for the decision to take or continue taking the medication and for identifying and securing a legal supply source. By  law, supplying the medication is the job and responsibility of the pharmacy. The prescriber is responsible for the evaluation, monitoring, and prescribing of these medications.   Prescribers will NOT: Re-issue prescriptions that have been partially filled. Re-issue prescriptions already sent to a pharmacy.  Re-send prescriptions to a different pharmacy because yours did not have your medication. Ask pharmacist to order more medicine or transfer the prescription to another pharmacy. (Read below.)  New 2023 regulation: "September 30, 2021 Revised Regulation Allows DEA-Registered Pharmacies to Transfer Electronic Prescriptions at a Patient's Request [place] Patients now have the ability to request their electronic prescription be transferred to another pharmacy without having to go back to their practitioner to initiate the request. This revised regulation went into effect on Monday, September 26, 2021.     At a patient's request, a DEA-registered retail pharmacy can now transfer an electronic prescription for a controlled substance (schedules II-V) to another DEA-registered retail pharmacy. Prior to this change, patients would have to go through their practitioner to cancel their prescription and have it re-issued to a different pharmacy. The process was taxing and time consuming for both patients and practitioners.    The Drug Enforcement Administration [facility]) published its intent to revise the process for transferring electronic prescriptions on December 19, 2019.  The final rule was published in the federal register on August 25, 2021 and went into effect 30 days later.  Under the final rule, a prescription can only be transferred once between pharmacies, and only if allowed under existing state or other applicable law. The prescription must remain in its electronic form; may not be altered in any way; and the transfer must be communicated directly between  two licensed pharmacists. It's important to note, any authorized refills transfer with the original prescription, which means the entire prescription will be filled at the same pharmacy".  Reference: [email or web address] [facility] website announcement)  [email or web address] ([place])   [employer] / Vol. 88, No. 143 / Thursday, August 25, 2021 / Rules and Regulations DEPARTMENT OF JUSTICE  Drug Enforcement  Administration  21 CFR Part 1306  [Docket No. [id number]]  RIN 1117-AB64 Transfer of Electronic Prescriptions for Schedules II-V Controlled Substances Between Pharmacies for Initial Filling  ____________________________________________________________________________________________     ____________________________________________________________________________________________  Drug Holidays  What is a "Drug Holiday"? Drug Holiday: is the name given to the process of slowly tapering down and temporarily stopping the pain medication for the purpose of decreasing or eliminating tolerance to the drug.  Benefits Improved effectiveness Decreased required effective dose Improved pain control End dependence on high dose therapy Decrease cost of therapy Uncovering "opioid-induced hyperalgesia". (OIH)  What is "opioid hyperalgesia"? It is a paradoxical increase in pain caused by exposure to opioids. Stopping the opioid pain medication, contrary to the expected, it actually decreases or completely eliminates the pain. Ref.: "A comprehensive review of opioid-induced hyperalgesia". Marion Lee, et.al. Pain Physician. 2011 Mar-Apr;14(2):145-61.  What is tolerance? Tolerance: the progressive loss of effectiveness of a pain medicine due to repetitive use. A common problem of opioid pain medications.  How long should a "Drug  Holiday" last? Effectiveness depends on the patient staying off all opioid pain medicines for a minimum of 14 consecutive days. (2 weeks)  How about just taking less of the medicine? Does not work. Will not accomplish goal of eliminating the excess receptors.  How about switching to a different pain medicine? (AKA. "Opioid rotation") Does not work. Creates the illusion of effectiveness by taking advantage of inaccurate equivalent dose calculations between different opioids. -This "technique" was promoted by studies funded by pharmaceutical companies, such as [employer], creators of "OxyContin".  Can I stop the medicine "cold turkey"? Depends. You should always coordinate with your Pain Specialist to make the transition as smoothly as possible. Avoid stopping the medicine abruptly without consulting. We recommend a "slow taper".  What is a slow taper? Taper: refers to the gradual decrease in dose.   How do I stop/taper the dose? Slowly. Decrease the daily amount of pills that you take by one (1) pill every seven (7) days. This is called a "slow downward taper". Example: if you normally take four (4) pills per day, drop it to three (3) pills per day for seven (7) days, then to two (2) pills per day for seven (7) days, then to one (1) per day for seven (7) days, and then stop the medicine. The 14 day "Drug Holiday" starts on the first day without medicine.   Will I experience withdrawals? Unlikely with a slow taper.  What triggers withdrawals? Withdrawals are triggered by the sudden/abrupt stop of high dose opioids. Withdrawals can be avoided by slowly decreasing the dose over a prolonged period of time.  What are withdrawals? Symptoms associated with sudden/abrupt reduction/stopping of high-dose, long-term use of pain medication. Withdrawal are seldom seen on low dose therapy, or patients rarely taking opioid medication.  Early Withdrawal Symptoms may include: Agitation Anxiety Muscle  aches Increased tearing Insomnia Runny nose Sweating Yawning  Late symptoms may include: Abdominal cramping Diarrhea Dilated pupils Goose bumps Nausea Vomiting  (Last update: 01/08/2022) ____________________________________________________________________________________________    _______________________________________________________________________  Medication Rules  Purpose: To inform patients, and their family members, of our medication rules and regulations.  Applies to: All patients receiving prescriptions from our practice (written or electronic).  Pharmacy of record: This is the pharmacy where your electronic prescriptions will be sent. Make sure we have the correct one.  Electronic prescriptions: In compliance with the [place] Strengthen Opioid Misuse Prevention (STOP) Act of 2017 (Session Law 2017-74/H243), effective January 30, 2018, all controlled substances must be electronically prescribed. Written prescriptions, faxing, or calling prescriptions to a pharmacy will no longer be done.  Prescription refills: These will be provided only during in-person appointments. No medications will be renewed without a "face-to-face" evaluation with your provider. Applies to all prescriptions.  NOTE: The following applies primarily to controlled substances (Opioid* Pain Medications).   Type of encounter (visit): For patients receiving controlled substances, face-to-face visits are required. (Not an option and not up to the patient.)  Patient's responsibilities: Pain Pills: Bring all pain pills to every appointment (except for procedure appointments). Pill Bottles: Bring pills in original pharmacy bottle. Bring bottle, even if empty. Always bring the bottle of the most recent fill.  Medication refills: You are responsible for knowing and keeping track of what medications you are taking and when is it that you will need a refill. The day before your appointment: write a  list of all prescriptions that need to be refilled. The day of the appointment: give the list to the admitting nurse. Prescriptions will be written only during appointments. No prescriptions will be written on procedure days. If you forget a medication: it will not be "Called in", "Faxed", or "electronically sent". You will need to get another appointment to get these prescribed. No early refills. Do not call asking to have your prescription filled early. Partial  or short prescriptions: Occasionally your pharmacy may not have enough pills to fill your prescription.  NEVER ACCEPT a partial fill or a prescription that is short of the total amount of pills that you were prescribed.  With controlled substances the law allows 72 hours for the pharmacy to complete the prescription.  If the prescription is not completed within 72 hours, the pharmacist will require a new prescription to be written. This means that you will be short on your medicine and we WILL NOT send another prescription to complete your original prescription.  Instead, request the pharmacy to send a carrier to a nearby branch to get enough medication to provide you with your full prescription. Prescription Accuracy: You are responsible for carefully inspecting your prescriptions before leaving our office. Have the discharge nurse carefully go over each prescription with you, before taking them home. Make sure that your name is accurately spelled, that your address is correct. Check the name and dose of your medication to make sure it is accurate. Check the number of pills, and the written instructions to make sure they are clear and accurate. Make sure that you are given enough medication to last until your next medication refill appointment. Taking Medication: Take medication as prescribed. When it comes to controlled substances, taking less pills or less frequently than prescribed is permitted and encouraged. Never take more pills than  instructed. Never take the medication more frequently than prescribed.  Inform other Doctors: Always inform, all of your healthcare providers, of all the medications you take. Pain Medication from other Providers: You are not allowed to accept any additional pain medication from any other Doctor or Healthcare provider. There are two exceptions to this rule. (see below) In the event that you require additional pain medication, you are responsible for notifying us, as stated below. Cough Medicine: Often these contain an opioid, such as codeine or hydrocodone. Never accept or take cough medicine containing these opioids if you are already taking an opioid* medication. The combination may cause respiratory failure and death. Medication Agreement: You are responsible for carefully reading and following our Medication Agreement. This   must be signed before receiving any prescriptions from our practice. Safely store a copy of your signed Agreement. Violations to the Agreement will result in no further prescriptions. (Additional copies of our Medication Agreement are available upon request.) Laws, Rules, & Regulations: All patients are expected to follow all Federal and State Laws, Statutes, Rules, & Regulations. Ignorance of the Laws does not constitute a valid excuse.  Illegal drugs and Controlled Substances: The use of illegal substances (including, but not limited to marijuana and its derivatives) and/or the illegal use of any controlled substances is strictly prohibited. Violation of this rule may result in the immediate and permanent discontinuation of any and all prescriptions being written by our practice. The use of any illegal substances is prohibited. Adopted CDC guidelines & recommendations: Target dosing levels will be at or below 60 MME/day. Use of benzodiazepines** is not recommended.  Exceptions: There are only two exceptions to the rule of not receiving pain medications from other Healthcare  Providers. Exception #1 (Emergencies): In the event of an emergency (i.e.: accident requiring emergency care), you are allowed to receive additional pain medication. However, you are responsible for: As soon as you are able, call our office [phone], at any time of the day or night, and leave a message stating your name, the date and nature of the emergency, and the name and dose of the medication prescribed. In the event that your call is answered by a member of our staff, make sure to document and save the date, time, and the name of the person that took your information.  Exception #2 (Planned Surgery): In the event that you are scheduled by another doctor or dentist to have any type of surgery or procedure, you are allowed (for a period no longer than 30 days), to receive additional pain medication, for the acute post-op pain. However, in this case, you are responsible for picking up a copy of our "Post-op Pain Management for Surgeons" handout, and giving it to your surgeon or dentist. This document is available at our office, and does not require an appointment to obtain it. Simply go to our office during business hours (Monday-Thursday from 8:00 AM to 4:00 PM) (Friday 8:00 AM to 12:00 Noon) or if you have a scheduled appointment with us, prior to your surgery, and ask for it by name. In addition, you are responsible for: calling our office [phone], at any time of the day or night, and leaving a message stating your name, name of your surgeon, type of surgery, and date of procedure or surgery. Failure to comply with your responsibilities may result in termination of therapy involving the controlled substances. Medication Agreement Violation. Following the above rules, including your responsibilities will help you in avoiding a Medication Agreement Violation ("Breaking your Pain Medication Contract").  Consequences:  Not following the above rules may result in permanent discontinuation of  medication prescription therapy.  *Opioid medications include: morphine, codeine, oxycodone, oxymorphone, hydrocodone, hydromorphone, meperidine, tramadol, tapentadol, buprenorphine, fentanyl, methadone. **Benzodiazepine medications include: diazepam (Valium), alprazolam (Xanax), clonazepam (Klonopine), lorazepam (Ativan), clorazepate (Tranxene), chlordiazepoxide (Librium), estazolam (Prosom), oxazepam (Serax), temazepam (Restoril), triazolam (Halcion) (Last updated: 11/22/2021) ______________________________________________________________________    ______________________________________________________________________  Medication Recommendations and Reminders  Applies to: All patients receiving prescriptions (written and/or electronic).  Medication Rules & Regulations: You are responsible for reading, knowing, and following our "Medication Rules" document. These exist for your safety and that of others. They are not flexible and neither are we. Dismissing or ignoring them is an   act of "non-compliance" that may result in complete and irreversible termination of such medication therapy. For safety reasons, "non-compliance" will not be tolerated. As with the [place] fundamental legal principle of "ignorance of the law is no defense", we will accept no excuses for not having read and knowing the content of documents provided to you by our practice.  Pharmacy of record:  Definition: This is the pharmacy where your electronic prescriptions will be sent.  We do not endorse any particular pharmacy. It is up to you and your insurance to decide what pharmacy to use.  We do not restrict you in your choice of pharmacy. However, once we write for your prescriptions, we will NOT be re-sending more prescriptions to fix restricted supply problems created by your pharmacy, or your insurance.  The pharmacy listed in the electronic medical record should be the one where you want electronic prescriptions to be  sent. If you choose to change pharmacy, simply notify our nursing staff. Changes will be made only during your regular appointments and not over the phone.  Recommendations: Keep all of your pain medications in a safe place, under lock and key, even if you live alone. We will NOT replace lost, stolen, or damaged medication. We do not accept "Police Reports" as proof of medications having been stolen. After you fill your prescription, take 1 week's worth of pills and put them away in a safe place. You should keep a separate, properly labeled bottle for this purpose. The remainder should be kept in the original bottle. Use this as your primary supply, until it runs out. Once it's gone, then you know that you have 1 week's worth of medicine, and it is time to come in for a prescription refill. If you do this correctly, it is unlikely that you will ever run out of medicine. To make sure that the above recommendation works, it is very important that you make sure your medication refill appointments are scheduled at least 1 week before you run out of medicine. To do this in an effective manner, make sure that you do not leave the office without scheduling your next medication management appointment. Always ask the nursing staff to show you in your prescription , when your medication will be running out. Then arrange for the receptionist to get you a return appointment, at least 7 days before you run out of medicine. Do not wait until you have 1 or 2 pills left, to come in. This is very poor planning and does not take into consideration that we may need to cancel appointments due to bad weather, sickness, or emergencies affecting our staff. DO NOT ACCEPT A "Partial Fill": If for any reason your pharmacy does not have enough pills/tablets to completely fill or refill your prescription, do not allow for a "partial fill". The law allows the pharmacy to complete that prescription within 72 hours, without requiring a new  prescription. If they do not fill the rest of your prescription within those 72 hours, you will need a separate prescription to fill the remaining amount, which we will NOT provide. If the reason for the partial fill is your insurance, you will need to talk to the pharmacist about payment alternatives for the remaining tablets, but again, DO NOT ACCEPT A PARTIAL FILL, unless you can trust your pharmacist to obtain the remainder of the pills within 72 hours.  Prescription refills and/or changes in medication(s):  Prescription refills, and/or changes in dose or medication, will be conducted only   during scheduled medication management appointments. (Applies to both, written and electronic prescriptions.) No refills on procedure days. No medication will be changed or started on procedure days. No changes, adjustments, and/or refills will be conducted on a procedure day. Doing so will interfere with the diagnostic portion of the procedure. No phone refills. No medications will be "called into the pharmacy". No Fax refills. No weekend refills. No Holliday refills. No after hours refills.  Remember:  Business hours are:  Monday to Thursday 8:00 AM to 4:00 PM Provider's Schedule: Milinda Pointer, MD - Appointments are:  Medication management: Monday and Wednesday 8:00 AM to 4:00 PM Procedure day: Tuesday and Thursday 7:30 AM to 4:00 PM Gillis Santa, MD - Appointments are:  Medication management: Tuesday and Thursday 8:00 AM to 4:00 PM Procedure day: Monday and Wednesday 7:30 AM to 4:00 PM (Last update: 11/22/2021) ______________________________________________________________________   ____________________________________________________________________________________________  Naloxone Nasal Spray  Why am I receiving this medication? [place] STOP ACT requires that all patients taking high dose opioids or at risk of opioids respiratory depression, be prescribed an opioid reversal agent,  such as Naloxone (AKA: Narcan).  What is this medication? NALOXONE (nal OX one) treats opioid overdose, which causes slow or shallow breathing, severe drowsiness, or trouble staying awake. Call emergency services after using this medication. You may need additional treatment. Naloxone works by reversing the effects of opioids. It belongs to a group of medications called opioid blockers.  COMMON BRAND NAME(S): Kloxxado, Narcan  What should I tell my care team before I take this medication? They need to know if you have any of these conditions: Heart disease Substance use disorder An unusual or allergic reaction to naloxone, other medications, foods, dyes, or preservatives Pregnant or trying to get pregnant Breast-feeding  When to use this medication? This medication is to be used for the treatment of respiratory depression (less than 8 breaths per minute) secondary to opioid overdose.   How to use this medication? This medication is for use in the nose. Lay the person on their back. Support their neck with your hand and allow the head to tilt back before giving the medication. The nasal spray should be given into 1 nostril. After giving the medication, move the person onto their side. Do not remove or test the nasal spray until ready to use. Get emergency medical help right away after giving the first dose of this medication, even if the person wakes up. You should be familiar with how to recognize the signs and symptoms of a narcotic overdose. If more doses are needed, give the additional dose in the other nostril. Talk to your care team about the use of this medication in children. While this medication may be prescribed for children as young as newborns for selected conditions, precautions do apply.  Naloxone Overdosage: If you think you have taken too much of this medicine contact a poison control center or emergency room at once.  NOTE: This medicine is only for you. Do not share this  medicine with others.  What if I miss a dose? This does not apply.  What may interact with this medication? This is only used during an emergency. No interactions are expected during emergency use. This list may not describe all possible interactions. Give your health care provider a list of all the medicines, herbs, non-prescription drugs, or dietary supplements you use. Also tell them if you smoke, drink alcohol, or use illegal drugs. Some items may interact with your medicine.  What  should I watch for while using this medication? Keep this medication ready for use in the case of an opioid overdose. Make sure that you have the phone number of your care team and local hospital ready. You may need to have additional doses of this medication. Each nasal spray contains a single dose. Some emergencies may require additional doses. After use, bring the treated person to the nearest hospital or call 911. Make sure the treating care team knows that the person has received a dose of this medication. You will receive additional instructions on what to do during and after use of this medication before an emergency occurs.  What side effects may I notice from receiving this medication? Side effects that you should report to your care team as soon as possible: Allergic reactions--skin rash, itching, hives, swelling of the face, lips, tongue, or throat Side effects that usually do not require medical attention (report these to your care team if they continue or are bothersome): Constipation Dryness or irritation inside the nose Headache Increase in blood pressure Muscle spasms Stuffy nose Toothache This list may not describe all possible side effects. Call your doctor for medical advice about side effects. You may report side effects to FDA at 1-800-FDA-1088.  Where should I keep my medication? Because this is an emergency medication, you should keep it with you at all times.  Keep out of the reach  of children and pets. Store between 20 and 25 degrees C (68 and 77 degrees F). Do not freeze. Throw away any unused medication after the expiration date. Keep in original box until ready to use.  NOTE: This sheet is a summary. It may not cover all possible information. If you have questions about this medicine, talk to your doctor, pharmacist, or health care provider.   2023 Elsevier/Gold Standard (2020-09-24 00:00:00)  ____________________________________________________________________________________________

## 2022-01-24 ENCOUNTER — Other Ambulatory Visit: Payer: Self-pay | Admitting: Family Medicine

## 2022-01-24 DIAGNOSIS — I482 Chronic atrial fibrillation, unspecified: Secondary | ICD-10-CM

## 2022-01-24 MED ORDER — APIXABAN 2.5 MG PO TABS: 2.5000 mg | ORAL_TABLET | Freq: Two times a day (BID) | ORAL | 0 refills | Status: DC

## 2022-01-25 ENCOUNTER — Ambulatory Visit: Payer: Medicare Other

## 2022-01-31 ENCOUNTER — Telehealth: Payer: Self-pay | Admitting: Physician Assistant

## 2022-01-31 ENCOUNTER — Ambulatory Visit: Payer: Self-pay

## 2022-01-31 NOTE — Telephone Encounter (Signed)
  Chief Complaint: medication refill Symptoms: NA Frequency: today Pertinent Negatives: NA Disposition: '[]'$ ED /'[]'$ Urgent Care (no appt availability in office) / '[]'$ Appointment(In office/virtual)/ '[]'$  [facility]/ '[]'$ Home Care/ '[]'$ Refused Recommended Disposition /'[]'$ [facility]/ '[x]'$  Follow-up with PCP Additional Notes: spoke with pt's son Herbie Baltimore. They are requesting refill for compound pain cream. Doreatha Massed that I would send request to provider. He would like a call back to pt when rx has been sent.   Summary: Pt son requests pt be contacted regarding a Rx refill   Pt son Herbie Baltimore stated pt has been trying to reach the office but has not been successful. Robert asked that the pt be contacted regarding a Rx refill for a cream for pain. Herbie Baltimore did not have the name of the medication but asked that pt be contacted at [phone]     Reason for Disposition  [1] Prescription refill request for NON-ESSENTIAL medicine (i.e., no harm to patient if med not taken) AND [2] triager unable to refill per department policy  Answer Assessment - Initial Assessment Questions 1. DRUG NAME: "What medicine do you need to have refilled?"     Compound pain ointment  2. REFILLS REMAINING: "How many refills are remaining?" (Note: The label on the medicine or pill bottle will show how many refills are remaining. If there are no refills remaining, then a renewal may be needed.)     0 4. PRESCRIBING HCP: "Who prescribed it?" Reason: If prescribed by specialist, call should be referred to that group.     Dr. Rosanna Randy  Protocols used: Medication Refill and Renewal Call-A-AH

## 2022-01-31 NOTE — Telephone Encounter (Signed)
Requested medication (s) are due for refill today: yes  Requested medication (s) are on the active medication list: yes  Last refill:  10/06/21 #120g/3  Future visit scheduled: yes  Notes to clinic:  no protocol attached. Pt would like call back when sent in if possible.      Requested Prescriptions  Pending Prescriptions Disp Refills   NONFORMULARY OR COMPOUNDED ITEM 120 each 3    Sig: See pharmacy note- Not to use on any open skin or wound.     There is no refill protocol information for this order

## 2022-02-01 ENCOUNTER — Ambulatory Visit (INDEPENDENT_AMBULATORY_CARE_PROVIDER_SITE_OTHER): Payer: Medicare Other

## 2022-02-01 DIAGNOSIS — I482 Chronic atrial fibrillation, unspecified: Secondary | ICD-10-CM

## 2022-02-01 LAB — POCT INR
INR: 2.1 (ref 2.0–3.0)
POC INR: 2.1
PT: 25.5

## 2022-02-01 NOTE — Telephone Encounter (Signed)
Pt is calling back to follow up on medication refill. Pt stated she needs her medication.  Please send refill to pharmacy below because they are the only pharmacy that does compounded medication.   [place], [place] - [place] [employer] [address]  Phone: [phone] Fax: [phone]  Hours: Not open 24 hours   Please advise.  Pt is requesting a call back with a follow up.

## 2022-02-01 NOTE — Patient Instructions (Signed)
Description   Continue alternating 3 mg and 4 mg daily. Fu 2 weeks.

## 2022-02-02 NOTE — Telephone Encounter (Signed)
The patient called back in sounding desperate to get her cream refill that has been called in several times. She states it really helps her and she was getting this from Dr Rosanna Randy before he left and now she just feels stuck. Pleas assist patient and she doesn't know what to do and is beside her self. She really needs this.

## 2022-02-15 ENCOUNTER — Ambulatory Visit: Payer: Medicare Other | Admitting: Physician Assistant

## 2022-02-15 ENCOUNTER — Encounter: Payer: Self-pay | Admitting: Physician Assistant

## 2022-02-15 VITALS — BP 130/72 | HR 76 | Wt 214.0 lb

## 2022-02-15 DIAGNOSIS — E1159 Type 2 diabetes mellitus with other circulatory complications: Secondary | ICD-10-CM

## 2022-02-15 DIAGNOSIS — Z7901 Long term (current) use of anticoagulants: Secondary | ICD-10-CM

## 2022-02-15 DIAGNOSIS — Z794 Long term (current) use of insulin: Secondary | ICD-10-CM | POA: Diagnosis not present

## 2022-02-15 DIAGNOSIS — I482 Chronic atrial fibrillation, unspecified: Secondary | ICD-10-CM | POA: Diagnosis not present

## 2022-02-15 LAB — POCT INR: INR: 2.6 (ref 2.0–3.0)

## 2022-02-15 MED ORDER — APIXABAN 2.5 MG PO TABS: 2.5000 mg | ORAL_TABLET | Freq: Two times a day (BID) | ORAL | 1 refills | Status: DC

## 2022-02-15 NOTE — Assessment & Plan Note (Signed)
Given two types of insulin, pt is a candidate for freestyle or dexcom ccm monitoring Referred to ccm pharmacy for assistance with an affordable prescription

## 2022-02-15 NOTE — Progress Notes (Signed)
Established patient visit   Patient: Tracy Mann   DOB: [birth year]   89 y.o. Female  MRN: [id number] Visit Date: 02/15/2022  Today's healthcare provider: [name], PA-C   Cc. Anticoagulation follow up   Subjective    HPI  Talk to alex to see if there is coverage for a free style  Anticoagulation: Patient here for anticoagulation monitoring. Indication: atrial fibrillation Bleeding Signs/Symptoms:  None Thromboembolic Signs/Symptoms:  None  Missed Coumadin Doses:  None Medication Changes:  no Dietary Changes:  no Bacterial/Viral Infection:  no  Other Concerns:  no   Medications: Outpatient Medications Prior to Visit  Medication Sig   albuterol (VENTOLIN HFA) 108 (90 Base) MCG/ACT inhaler TAKE 2 PUFFS BY MOUTH EVERY 6 HOURS AS NEEDED FOR WHEEZE OR SHORTNESS OF BREATH   ezetimibe (ZETIA) 10 MG tablet Take 1 tablet (10 mg total) by mouth daily.   flavoxATE (URISPAS) 100 MG tablet Take 1 tablet (100 mg total) by mouth 2 (two) times daily as needed for bladder spasms.   fluticasone (FLONASE) 50 MCG/ACT nasal spray Place 2 sprays into both nostrils daily.   HYDROcodone-acetaminophen (NORCO) 10-325 MG tablet Take 1 tablet by mouth every 8 (eight) hours as needed for severe pain. Must last 30 days.   [START ON 02/25/2022] HYDROcodone-acetaminophen (NORCO) 10-325 MG tablet Take 1 tablet by mouth every 8 (eight) hours as needed for severe pain. Must last 30 days.   [START ON 03/27/2022] HYDROcodone-acetaminophen (NORCO) 10-325 MG tablet Take 1 tablet by mouth every 8 (eight) hours as needed for severe pain. Must last 30 days.   insulin glargine (LANTUS SOLOSTAR) 100 UNIT/ML Solostar Pen Inject 24 Units into the skin at bedtime.   insulin lispro (HUMALOG) 100 UNIT/ML KwikPen Inject 10-25 Units into the skin in the morning, at noon, and at bedtime.   Insulin Pen Needle (RELION PEN NEEDLE 31G/8MM) 31G X 8 MM MISC Use with insulin pen three times daily   levothyroxine  (SYNTHROID) 25 MCG tablet Take 25 mcg by mouth daily before breakfast.   lisinopril (ZESTRIL) 10 MG tablet Take 1 tablet (10 mg total) by mouth daily.   LORazepam (ATIVAN) 1 MG tablet TAKE 1 TABLET(1 MG) BY MOUTH TWICE DAILY AS NEEDED   Magnesium 500 MG CAPS Take 1,000 mg by mouth daily.    metolazone (ZAROXOLYN) 2.5 MG tablet Take 1 tablet (2.5 mg total) by mouth as directed. Take two times a week with torsemide 40 mg   naloxone (NARCAN) nasal spray 4 mg/0.1 mL Place 1 spray into the nose as needed for up to 365 doses (for opioid-induced respiratory depresssion). In case of emergency (overdose), spray once into each nostril. If no response within 3 minutes, repeat application and call [phone].   nitroGLYCERIN (NITROSTAT) 0.4 MG SL tablet Place 1 tablet (0.4 mg total) under the tongue every 5 (five) minutes as needed for chest pain.   NONFORMULARY OR COMPOUNDED ITEM See pharmacy note- Not to use on any open skin or wound.   ondansetron (ZOFRAN) 4 MG tablet Take 1 tablet (4 mg total) by mouth every 12 (twelve) hours as needed for nausea or vomiting.   polyethylene glycol powder (GLYCOLAX/MIRALAX) 17 GM/SCOOP powder Take 255 g by mouth daily.   potassium chloride SA (KLOR-CON) 20 MEQ tablet Take 1 tablet (20 mEq total) by mouth daily.   rOPINIRole (REQUIP) 0.25 MG tablet TAKE 1 TABLET BY MOUTH EVERYDAY AT BEDTIME   torsemide (DEMADEX) 20 MG tablet Take 2 tablets (40  mg total) by mouth daily.   warfarin (COUMADIN) 4 MG tablet Take daily or as directed by physician   [DISCONTINUED] apixaban (ELIQUIS) 2.5 MG TABS tablet Take 1 tablet (2.5 mg total) by mouth 2 (two) times daily.   No facility-administered medications prior to visit.      Objective    BP 130/72 (BP Location: Right Arm, Patient Position: Sitting, Cuff Size: Large)   Pulse 76   Wt 214 lb (97.1 kg)   BMI 35.61 kg/m    Physical Exam Vitals reviewed.  Constitutional:      Appearance: She is not ill-appearing.  HENT:     Head:  Normocephalic.  Eyes:     Conjunctiva/sclera: Conjunctivae normal.  Cardiovascular:     Rate and Rhythm: Normal rate.  Pulmonary:     Effort: Pulmonary effort is normal. No respiratory distress.  Neurological:     General: No focal deficit present.     Mental Status: She is alert and oriented to person, place, and time.  Psychiatric:        Mood and Affect: Mood normal.        Behavior: Behavior normal.    Results for orders placed or performed in visit on 02/15/22  POCT INR  Result Value Ref Range   INR 2.6 2.0 - 3.0   POC INR      Assessment & Plan     Problem List Items Addressed This Visit       Cardiovascular and Mediastinum   Chronic atrial fibrillation (HCC) - Primary (Chronic)    Due to age, mobility difficulties, high fall risk, difficulty with transportation, discussed switching coumadin to eliquis 2.5 mg BID. Discussed transition, potential cost issues. Pt is agreeable to changing as she has a difficult time making it into the office every 2-4 weeks, especially during the winter. Advise I will prescribe, and if affordable, okay to stop coumadin and switch to eliquis.  I will also refer to [facility] pharmacy to help monitor the transition       Relevant Medications   apixaban (ELIQUIS) 2.5 MG TABS tablet   Other Relevant Orders   POCT INR (Completed)   AMB Referral to Chronic Care Management Services     Endocrine   Diabetes mellitus ([place])    Given two types of insulin, pt is a candidate for freestyle or dexcom ccm monitoring Referred to ccm pharmacy for assistance with an affordable prescription      Relevant Orders   AMB Referral to Chronic Care Management Services   Other Visit Diagnoses     Anticoagulation monitoring, INR range 2-3       Relevant Medications   apixaban (ELIQUIS) 2.5 MG TABS tablet   Other Relevant Orders   POCT INR (Completed)       Return in about 4 months (around 06/16/2022) for chronic conditions.      I, [name], PA-C  have reviewed all documentation for this visit. The documentation on  02/15/22  for the exam, diagnosis, procedures, and orders are all accurate and complete.  [name], PA-C [facility] [address] [place], [place], [address] Office: [phone] Fax: [place]

## 2022-02-15 NOTE — Assessment & Plan Note (Signed)
Due to age, mobility difficulties, high fall risk, difficulty with transportation, discussed switching coumadin to eliquis 2.5 mg BID. Discussed transition, potential cost issues. Pt is agreeable to changing as she has a difficult time making it into the office every 2-4 weeks, especially during the winter. Advise I will prescribe, and if affordable, okay to stop coumadin and switch to eliquis.  I will also refer to [facility] pharmacy to help monitor the transition

## 2022-02-21 ENCOUNTER — Telehealth: Payer: Self-pay | Admitting: Physician Assistant

## 2022-02-21 NOTE — Telephone Encounter (Signed)
Patient declined AWV now on do not call list.

## 2022-02-22 ENCOUNTER — Telehealth: Payer: Self-pay

## 2022-02-22 ENCOUNTER — Other Ambulatory Visit: Payer: Self-pay | Admitting: Cardiovascular Disease

## 2022-02-22 ENCOUNTER — Other Ambulatory Visit: Payer: Medicare Other | Admitting: Pharmacist

## 2022-02-22 DIAGNOSIS — E11649 Type 2 diabetes mellitus with hypoglycemia without coma: Secondary | ICD-10-CM

## 2022-02-22 DIAGNOSIS — E1169 Type 2 diabetes mellitus with other specified complication: Secondary | ICD-10-CM

## 2022-02-22 NOTE — Telephone Encounter (Signed)
Copied from [place] [phone]. Topic: General - Other >> Feb 22, 2022 12:36 PM Jacinto Reap M wrote: Reason for CRM: Pt would like to schedule appt for coumadin clinic

## 2022-02-22 NOTE — Progress Notes (Unsigned)
02/22/2022 Name: Tracy Mann MRN: [id number] DOB: 19-Jul-1933  Chief Complaint  Patient presents with   Medication Assistance    Tracy Mann is a 89 y.o. year old female was referred to the pharmacist by their PCP for assistance in managing medication access.    Reach patient today by telephone for initial conversation regarding medication access/cost concerns. Agree to follow up again in 1 week for further discussion. Will plan to complete medication review at that time.  Subjective:  Care Team: Primary Care Provider: [name], PA-C Endocrinologist Solum, Felipa Evener, MD; Next Scheduled Visit: 06/06/2022 Pain Management: Milinda Pointer, MD; Next Scheduled Visit: 04/19/2022 Cardiologist: Minna Merritts, MD Gastroenterology: Lin Landsman, MD Urology: Bjorn Loser, MD  Medication Access/Adherence  Current Pharmacy:  [facility] [place], [place] [address] Phone: [phone] Fax: [phone]  [place] [address] [place] ([place]) [place] [address] Phone: [phone] Fax: [place], [place] [address] Phone: [phone] Fax: [place], [place] AT [facility] [address] [place] [address] Phone: [phone] Fax: [phone]   Patient reports affordability concerns with their medications: Yes  Patient reports access/transportation concerns to their pharmacy: No  Patient reports adherence concerns with their medications:  No     Medication Assistance/Anticoagulation:  Current treatment: warfarin (dosing instruction provided by PCP)  Indication: atrial fibrillation  Last INR: 2.6 on 02/15/2022 (Goal INR: 2-3)  Per referral message from PCP, due to age,  mobility difficulties, high fall risk, difficulty with transportation, provider and patient discussed switching coumadin to eliquis 2.5 mg BID  Today patient advises she has not yet decided if she wants to make the change to Eliquis, as wants to "study up on it more", but planned to remain on warfarin for now  Current medication access support: none   Diabetes:  Note patient followed by [facility] Endocrinology for T2DM  Current medications:   - Lantus 24 units daily  - Humalog according to sliding scale with meals from Dr. Gabriel Carina  Reports aware of how to manage low blood sugar readings and carries glucose tablets with her to use in case of hypoglycemia  Patient expresses interest in referral to dietitian  Reports currently checks her blood sugar using glucometer, but interested in using continuous glucose monitoring (CGM) for closer blood sugar monitoring/feedback - Reports previously tried and had a positive experience with using a Freestyle Libre CGM   Objective:  Lab Results  Component Value Date   HGBA1C 8.2 (H) 12/31/2019  From review of shared record from [place], note patient's latest A1C 7.0% on 12/06/2021  Lab Results  Component Value Date   CREATININE 1.41 (H) 09/19/2021   BUN 45 (H) 09/19/2021   NA 138 09/19/2021   K 5.0 09/19/2021   CL 104 09/19/2021   CO2 25 09/19/2021    Lab Results  Component Value Date   CHOL 149 06/14/2021   HDL 47 06/14/2021   LDLCALC 78 06/14/2021   TRIG 134 06/14/2021   CHOLHDL 3.2 06/14/2021    Medications Reviewed Today     Reviewed by [name], PA-C (Physician Assistant Certified) on 39/76/73 at [place] List Status: <None>   Medication Order Taking? Sig Documenting  Provider Last Dose Status Informant  albuterol (VENTOLIN HFA) 108 (90 Base) MCG/ACT inhaler [id number] Yes TAKE 2 PUFFS BY MOUTH EVERY 6 HOURS AS NEEDED FOR WHEEZE OR SHORTNESS OF BREATH Jerrol Banana., MD Taking Active    apixaban (ELIQUIS) 2.5 MG TABS tablet [id number]  Take 1 tablet (2.5 mg total) by mouth 2 (two) times daily. [name], PA-C  Active   ezetimibe (ZETIA) 10 MG tablet [id number] Yes Take 1 tablet (10 mg total) by mouth daily. Jerrol Banana., MD Taking Active   flavoxATE (URISPAS) 100 MG tablet [id number] Yes Take 1 tablet (100 mg total) by mouth 2 (two) times daily as needed for bladder spasms. Bjorn Loser, MD Taking Active   fluticasone [facility]) 50 MCG/ACT nasal spray [id number] Yes Place 2 sprays into both nostrils daily. [name], PA-C Taking Active   HYDROcodone-acetaminophen [facility]) 10-325 MG tablet [id number] Yes Take 1 tablet by mouth every 8 (eight) hours as needed for severe pain. Must last 30 days. Milinda Pointer, MD Taking Active            Med Note Dossie Arbour, [place] A   Mon Jan 16, 2022 12:33 PM) WARNING: Not a Duplicate. Future prescription. DO NOT DELETE during hospital medication reconciliation or at discharge. [facility] Patient  HYDROcodone-acetaminophen (NORCO) 10-325 MG tablet [id number] Yes Take 1 tablet by mouth every 8 (eight) hours as needed for severe pain. Must last 30 days. Milinda Pointer, MD Taking Active            Med Note Dossie Arbour, [place] A   Mon Jan 16, 2022 12:34 PM) WARNING: Not a Duplicate. Future prescription. DO NOT DELETE during hospital medication reconciliation or at discharge. [facility] Patient  HYDROcodone-acetaminophen (NORCO) 10-325 MG tablet [id number] Yes Take 1 tablet by mouth every 8 (eight) hours as needed for severe pain. Must last 30 days. Milinda Pointer, MD Taking Active            Med Note Dossie Arbour, [place] A   Mon Jan 16, 2022 12:33 PM) WARNING: Not a Duplicate. Future prescription. DO NOT DELETE during hospital medication reconciliation or at discharge. [facility] Patient  insulin glargine (LANTUS SOLOSTAR) 100 UNIT/ML Solostar Pen [id number] Yes Inject  24 Units into the skin at bedtime. [provider] Taking Active   insulin lispro (HUMALOG) 100 UNIT/ML KwikPen [id number] Yes Inject 10-25 Units into the skin in the morning, at noon, and at bedtime. [provider] Taking Active   Insulin Pen Needle (RELION PEN NEEDLE 31G/8MM) 31G X 8 MM MISC [id number] Yes Use with insulin pen three times daily Jerrol Banana., MD Taking Active   levothyroxine (SYNTHROID) 25 MCG tablet [id number] Yes Take 25 mcg by mouth daily before breakfast. [provider] Taking Active   lisinopril (ZESTRIL) 10 MG tablet [id number] Yes Take 1 tablet (10 mg total) by mouth daily. Minna Merritts, MD Taking Active   LORazepam (ATIVAN) 1 MG tablet [id number] Yes TAKE 1 TABLET(1 MG) BY MOUTH TWICE DAILY AS NEEDED Jerrol Banana., MD Taking Active   Magnesium 500 MG CAPS [id number] Yes Take 1,000 mg by mouth daily.  [provider] Taking Active   metolazone (ZAROXOLYN) 2.5 MG tablet [id number] Yes Take 1 tablet (2.5 mg total) by mouth as directed. Take two times a week with torsemide 40 mg Minna Merritts, MD Taking Active   naloxone [facility]) nasal spray 4 mg/0.1 mL [id number] Yes Place 1 spray into the  nose as needed for up to 365 doses (for opioid-induced respiratory depresssion). In case of emergency (overdose), spray once into each nostril. If no response within 3 minutes, repeat application and call [phone]. Milinda Pointer, MD Taking Active   nitroGLYCERIN (NITROSTAT) 0.4 MG SL tablet [id number] Yes Place 1 tablet (0.4 mg total) under the tongue every 5 (five) minutes as needed for chest pain. Minna Merritts, MD Taking Active   NONFORMULARY OR COMPOUNDED [place] [id number] Yes See pharmacy note- Not to use on any open skin or wound. Jerrol Banana., MD Taking Active   ondansetron [facility]) 4 MG tablet [id number] Yes Take 1 tablet (4 mg total) by mouth every 12 (twelve) hours as needed for nausea or vomiting. Jerrol Banana.,  MD Taking Active   polyethylene glycol powder [facility]) 17 GM/SCOOP powder [id number] Yes Take 255 g by mouth daily. Lin Landsman, MD Taking Active   potassium chloride SA (KLOR-CON) 20 MEQ tablet [id number] Yes Take 1 tablet (20 mEq total) by mouth daily. Minna Merritts, MD Taking Active   rOPINIRole (REQUIP) 0.25 MG tablet [id number] Yes TAKE 1 TABLET BY MOUTH EVERYDAY AT BEDTIME Jerrol Banana., MD Taking Active   torsemide [facility]) 20 MG tablet [id number] Yes Take 2 tablets (40 mg total) by mouth daily. Bensimhon, Shaune Pascal, MD Taking Active   warfarin (COUMADIN) 4 MG tablet [id number] Yes Take daily or as directed by physician Caryn Section Kirstie Peri, MD Taking Active   Med List Note Arlice Colt, RN 01/16/22 1246): uds 05/09/21 MR 04/26/22 06/16/2020 Fax sent to Dr. Miguel Aschoff for clearance to stop Coumadin for 5 days for procedure.   [facility]  Resent 06/24/20 attention Dr Caryn Section (Dr Rosanna Randy is out of office) LP  Approval received to stop coumadin x 5 days from Dr Rosanna Randy LP 06/29/20  12/08/2019 Fax sent to Dr. Miguel Aschoff for permission to stop Coumadin for 5 days prior to procedure. 01-16-2020  Approval received.  Given to [place].  Patient not noti fied because no procedure has been scheduled.  Will let JM know so that we can give instructions when procedure is scheduled.  12/08/2019 Medication agreement signed.              Assessment/Plan:   Medication Assistance/Anticoagulation: - Address patient's questions/concerns related to Eliquis side effects - Review with patient health plan (expected copay) benefit info per [employer] as well as Eliquis patient assistance program requirements (including the annual 3% annual OOP expense requirement) - As patient preferring to remain on warfarin at this time, advise her to contact office to schedule her next INR appointment - Collaborate with PCP to let provider know patient stating that she  has remained on warfarin as has not yet decided if she wants to switch to [employer] with PCP regarding potential Eliquis dosing for patient based on her age, renal function and body weight . Note for indication of atrial fibrillation, Eliquis package labeling recommends dose of Eliquis 5 mg twice daily unless ?89 years of age and either body weight ?60 kg or SCr ?1.5 mg/dL Note when switching from warfarin to Eliquis, Eliquis manufacturer recommends: warfarin should be discontinued and Eliquis started when the INR is below 2.0  Diabetes: - Currently controlled - Encourage patient to have well-balanced meals throughout the day, while controlling carbohydrates - Encourage patient to take insulin regimen as directed by Endocrinologist and calling Endocrinology as needed for concerns related to her blood sugar   -  Review symptoms of hypoglycemia, how to manage low blood sugar and encourage patient to continue to carry glucose tablets with her - Will collaborate with PCP to assist patient with obtaining CGM - Will collaborate with PCP to let provider know patient requesting referral to dietitian for help with meal planning to control carbohydrates    Follow Up Plan: Clinical Pharmacist will follow up with patient by telephone again on 03/01/2022 at 2 pm   Wallace Cullens, PharmD, [facility] [phone]

## 2022-02-22 NOTE — Telephone Encounter (Signed)
Pt calling back and requesting a call back to be scheduled for the coumadin clinic.  Pt is requesting a callback today.   Please advise.

## 2022-02-22 NOTE — Patient Instructions (Addendum)
Goals Addressed             This Visit's Progress    Pharmacy Goals       We are working on getting you approved for the Freestyle Libre 3 Continuous Glucose Monitoring.  In the meantime, if you would like to review more information regarding this CGM system and its setup, this information can be found at the following website:  https://www.freestyle.abbott/us-en/products/freestyle-libre-3.html  If you would please have your medications with you for [place] to review during our next call on 03/01/2022 at 2 pm  Thank you!  Wallace Cullens, PharmD, [facility] [phone]

## 2022-02-23 ENCOUNTER — Encounter: Payer: Self-pay | Admitting: Pharmacist

## 2022-02-23 MED ORDER — FREESTYLE LIBRE 3 READER DEVI: 1.0000 | Freq: Every day | 0 refills | Status: DC

## 2022-02-23 MED ORDER — FREESTYLE LIBRE 3 SENSOR MISC: 12 refills | Status: DC

## 2022-02-23 NOTE — Telephone Encounter (Signed)
Scheduled for 1/31 @ 1:00 pm

## 2022-02-27 ENCOUNTER — Encounter: Payer: Self-pay | Admitting: Pharmacist

## 2022-02-27 ENCOUNTER — Other Ambulatory Visit: Payer: Medicare Other | Admitting: Pharmacist

## 2022-02-27 NOTE — Patient Instructions (Signed)
It was a pleasure speaking with you again today!   For your reference, I wanted to provide you with the following resources:     For [facility] 3 Continuous Glucose Monitoring:   The following is the [employer] with information regarding this CGM system and its setup:    https://www.freestyle.abbott/us-en/products/freestyle-libre-3.html   The contact phone number for the [place] program is: [phone]     If you start taking Eliquis in the future, the requirements for the Eliquis patient assistance program can be found at the following website: http://parker-jones.com/ Program phone number: [phone]   Please keep in mind, that each year, you would need to submit documentation showing you have spent 3% of your annual household income on out-of-pocket prescription expenses for the year in which you are seeking assistance. Your pharmacy can provide this report.   I am happy to help you with applying for this assistance program in the future if you would like. Please feel free to give me a call.       Thank you!   Wallace Cullens, PharmD, [facility] [phone]

## 2022-02-27 NOTE — Progress Notes (Signed)
02/27/2022 Name: Tracy Mann MRN: [id number] DOB: Nov 26, 1933  Chief Complaint  Patient presents with   Medication Assistance    Tracy Mann is a 89 y.o. year old female who presented for a telephone visit.   They were referred to the pharmacist by their PCP for assistance in managing medication access.    Subjective:  From review of chart, note patient now scheduled for follow up appointment with PCP on 1/31 to further discuss anticoagulation plan  Care Team: Primary Care Provider: [name], PA-C; Next Scheduled Visit: 03/01/2022 Endocrinologist Solum, Felipa Evener, MD; Next Scheduled Visit: 06/06/2022 Pain Management: Milinda Pointer, MD; Next Scheduled Visit: 04/19/2022 Cardiologist: Minna Merritts, MD Gastroenterology: Lin Landsman, MD Urology: Bjorn Loser, MD  Medication Access/Adherence  Current Pharmacy:  [facility] [place], [place] [address] Phone: [phone] Fax: [phone]  [facility] [address] [place] ([place])  [address] Phone: [phone] Fax: [place], [place] [address] Phone: [phone] Fax: [place], [place] AT [facility] [address] [place] [address] Phone: [phone] Fax: [phone]   Patient reports affordability concerns with their medications: Yes  Patient reports access/transportation concerns to their pharmacy: No  Patient reports adherence concerns with their medications:  No       Medication Assistance/Anticoagulation:   Current treatment: warfarin (dosing instruction provided by PCP)   Indication: atrial fibrillation   Last INR: 2.6 on 02/15/2022 (Goal INR: 2-3)   Per referral message from PCP, due to age,  mobility difficulties, high fall risk, difficulty with transportation, provider and patient discussed switching coumadin to eliquis 2.5 mg BID   Today patient advises she has not yet decided if she wants to make the change to Eliquis, but looks forward to discussing this with PCP further at upcoming appointment on 1/31   Current medication access support: none     Diabetes:   Note patient followed by [facility] for T2DM   Current medications:              - Lantus 24 units daily             - Humalog according to sliding scale with meals from Dr. Gabriel Carina   Reports aware of how to manage low blood sugar readings and carries glucose tablets with her to use in case of hypoglycemia   Patient expresses interest in referral to dietitian   Reports currently checks her blood sugar using glucometer, but interested in using continuous glucose monitoring (CGM) for closer blood sugar monitoring/feedback - Reports previously tried and had a positive experience with using a Freestyle Libre CGM - On 02/22/2022 collaborated with PCP to request provider send prescriptions for [employer] 3 sensors and reader to pharmacy for patient Followed up with patient's [facility] and confirmed both sensors and reader covered at pharmacy with no copayment to patient Today patient reports that she picked up both sensors and reader prescriptions from pharmacy and plans to review provided education materials as well as have a family member assist her with setting up/starting to use this device     Objective:  Lab Results  Component Value Date   HGBA1C 8.2 (H) 12/31/2019  From review of shared record from  [facility], note patient's latest A1C 7.0% on 12/06/2021   Lab Results  Component Value Date   CREATININE 1.41 (H) 09/19/2021   BUN 45 (H) 09/19/2021   NA 138 09/19/2021   K 5.0 09/19/2021   CL 104 09/19/2021   CO2 25 09/19/2021    Lab Results  Component  Value Date   CHOL 149 06/14/2021   HDL 47 06/14/2021   LDLCALC 78 06/14/2021   TRIG 134 06/14/2021   CHOLHDL 3.2 06/14/2021    Medications Reviewed Today     Reviewed by [name], PA-C (Physician Assistant Certified) on 46/96/29 at [place] List Status: <None>   Medication Order Taking? Sig Documenting Provider Last Dose Status Informant  albuterol (VENTOLIN HFA) 108 (90 Base) MCG/ACT inhaler [id number] Yes TAKE 2 PUFFS BY MOUTH EVERY 6 HOURS AS NEEDED FOR WHEEZE OR SHORTNESS OF BREATH Jerrol Banana., MD Taking Active   apixaban (ELIQUIS) 2.5 MG TABS tablet [id number]  Take 1 tablet (2.5 mg total) by mouth 2 (two) times daily. [name], PA-C  Active   ezetimibe (ZETIA) 10 MG tablet [id number] Yes Take 1 tablet (10 mg total) by mouth daily. Jerrol Banana., MD Taking Active   flavoxATE (URISPAS) 100 MG tablet [id number] Yes Take 1 tablet (100 mg total) by mouth 2 (two) times daily as needed for bladder spasms. Bjorn Loser, MD Taking Active   fluticasone [facility]) 50 MCG/ACT nasal spray [id number] Yes Place 2 sprays into both nostrils daily. [name], PA-C Taking Active   HYDROcodone-acetaminophen [facility]) 10-325 MG tablet [id number] Yes Take 1 tablet by mouth every 8 (eight) hours as needed for severe pain. Must last 30 days. Milinda Pointer, MD Taking Active            Med Note Tracy Mann, [place] A   Mon Jan 16, 2022 12:33 PM) WARNING: Not a Duplicate. Future prescription. DO NOT DELETE during hospital medication reconciliation or at discharge. [facility] Patient  HYDROcodone-acetaminophen (NORCO) 10-325 MG tablet [id number] Yes Take 1 tablet by mouth every 8 (eight) hours as needed for severe pain. Must last 30 days. Milinda Pointer, MD Taking Active            Med Note Tracy Mann, [place] A   Mon Jan 16, 2022 12:34 PM) WARNING: Not a Duplicate. Future prescription. DO NOT DELETE during hospital medication reconciliation or at  discharge. [facility] Patient  HYDROcodone-acetaminophen (NORCO) 10-325 MG tablet [id number] Yes Take 1 tablet by mouth every 8 (eight) hours as needed for severe pain. Must last 30 days. Milinda Pointer, MD Taking Active            Med Note Tracy Mann, [place] A   Mon Jan 16, 2022 12:33 PM) WARNING: Not a Duplicate. Future prescription. DO NOT DELETE during hospital medication reconciliation or at discharge. [facility] Patient  insulin glargine (LANTUS SOLOSTAR) 100 UNIT/ML Solostar Pen [id number] Yes Inject 24 Units into the skin at bedtime. [provider] Taking Active   insulin lispro (HUMALOG) 100 UNIT/ML KwikPen [id number] Yes Inject 10-25 Units into the skin in the morning, at noon, and at bedtime. [provider] Taking Active   Insulin Pen Needle (RELION PEN NEEDLE 31G/8MM) 31G X 8 MM MISC [id number] Yes Use with insulin pen three times daily Jerrol Banana., MD Taking Active   levothyroxine (SYNTHROID) 25 MCG tablet [id number] Yes Take 25 mcg by mouth daily before breakfast. [provider] Taking Active  lisinopril (ZESTRIL) 10 MG tablet [id number] Yes Take 1 tablet (10 mg total) by mouth daily. Minna Merritts, MD Taking Active   LORazepam (ATIVAN) 1 MG tablet [id number] Yes TAKE 1 TABLET(1 MG) BY MOUTH TWICE DAILY AS NEEDED Jerrol Banana., MD Taking Active   Magnesium 500 MG CAPS [id number] Yes Take 1,000 mg by mouth daily.  [provider] Taking Active   metolazone (ZAROXOLYN) 2.5 MG tablet [id number] Yes Take 1 tablet (2.5 mg total) by mouth as directed. Take two times a week with torsemide 40 mg Minna Merritts, MD Taking Active   naloxone [facility]) nasal spray 4 mg/0.1 mL [id number] Yes Place 1 spray into the nose as needed for up to 365 doses (for opioid-induced respiratory depresssion). In case of emergency (overdose), spray once into each nostril. If no response within 3 minutes, repeat application  and call [phone]. Milinda Pointer, MD Taking Active   nitroGLYCERIN (NITROSTAT) 0.4 MG SL tablet [id number] Yes Place 1 tablet (0.4 mg total) under the tongue every 5 (five) minutes as needed for chest pain. Minna Merritts, MD Taking Active   NONFORMULARY OR COMPOUNDED [place] [id number] Yes See pharmacy note- Not to use on any open skin or wound. Jerrol Banana., MD Taking Active   ondansetron [facility]) 4 MG tablet [id number] Yes Take 1 tablet (4 mg total) by mouth every 12 (twelve) hours as needed for nausea or vomiting. Jerrol Banana., MD Taking Active   polyethylene glycol powder [facility]) 17 GM/SCOOP powder [id number] Yes Take 255 g by mouth daily. Lin Landsman, MD Taking Active   potassium chloride SA (KLOR-CON) 20 MEQ tablet [id number] Yes Take 1 tablet (20 mEq total) by mouth daily. Minna Merritts, MD Taking Active   rOPINIRole (REQUIP) 0.25 MG tablet [id number] Yes TAKE 1 TABLET BY MOUTH EVERYDAY AT BEDTIME Jerrol Banana., MD Taking Active   torsemide [facility]) 20 MG tablet [id number] Yes Take 2 tablets (40 mg total) by mouth daily. Bensimhon, Shaune Pascal, MD Taking Active   warfarin (COUMADIN) 4 MG tablet [id number] Yes Take daily or as directed by physician Caryn Section Kirstie Peri, MD Taking Active   Med List Note Arlice Colt, RN 01/16/22 1246): uds 05/09/21 MR 04/26/22 06/16/2020 Fax sent to Dr. Miguel Aschoff for clearance to stop Coumadin for 5 days for procedure.   [facility]  Resent 06/24/20 attention Dr Caryn Section (Dr Rosanna Randy is out of office) LP  Approval received to stop coumadin x 5 days from Dr Rosanna Randy LP 06/29/20  12/08/2019 Fax sent to Dr. Miguel Aschoff for permission to stop Coumadin for 5 days prior to procedure. 01-16-2020  Approval received.  Given to [place].  Patient not noti fied because no procedure has been scheduled.  Will let JM know so that we can give instructions when procedure is scheduled.  12/08/2019 Medication agreement signed.               Assessment/Plan:   Medication Assistance/Anticoagulation: - Again review with patient health plan (expected copay) benefit info per [employer] as well as Eliquis patient assistance program requirements (including the annual 3% annual OOP expense requirement) Based on reported income, patient would meet income requirement for Eliquis patient assistance program.  Advise patient that if she were to start Eliquis (following collaboration with PCP), then she would want to follow her OOP expense for the calendar year to know when to reach out to apply for the assistance program Offer to review information with  a family member, but patient decline for today. States that she will call me back if she would like for me to do this - As patient preferring to remain on warfarin at this time, she has contacted the office to schedule her next INR appointment - Collaborating with PCP regarding patient has remained on warfarin as has not yet decided if she wants to switch to Eliquis Have messaged PCP regarding potential Eliquis dosing for patient based on her age, renal function and body weight . Note for indication of atrial fibrillation, Eliquis package labeling recommends dose of Eliquis 5 mg twice daily unless ?89 years of age and either body weight ?60 kg or SCr ?1.5 mg/dL Note when switching from warfarin to Eliquis, Eliquis manufacturer recommends: warfarin should be discontinued and Eliquis started when the INR is below 2.0   Diabetes: - Currently controlled - Encourage patient to have well-balanced meals throughout the day, while controlling carbohydrates - Encourage patient to take insulin regimen as directed by Endocrinologist and calling Endocrinology as needed for concerns related to her blood sugar   - Have reviewed symptoms of hypoglycemia, how to manage low blood sugar and encourage patient to continue to carry glucose tablets with her - Collaborated with PCP to send  prescriptions for Freestyle Libre 3 sensors and reader to pharmacy for patient Followed up with patient's [facility] and confirmed both sensors and reader covered at pharmacy with no copayment to patient Patient states she plans to review provided education materials as well as have a family member assist her with setting up/starting to use this device Provide contact information for and encourage patient to reach out to [employer] [phone]) for training on device/assistance at home - Discuss continuous glucose monitoring (CGM) for glucose monitoring/provide feedback on dietary choices to aid with blood sugar control      Follow Up Plan:  1) Patient denies further medication questions or concerns today 2) Provide patient with contact information for clinic pharmacist to contact if needed in future for medication questions/concerns    Wallace Cullens, PharmD, [place] [phone]

## 2022-03-01 ENCOUNTER — Encounter: Payer: Self-pay | Admitting: Physician Assistant

## 2022-03-01 ENCOUNTER — Other Ambulatory Visit: Payer: Medicare Other | Admitting: Pharmacist

## 2022-03-01 ENCOUNTER — Ambulatory Visit: Payer: Medicare Other | Admitting: Physician Assistant

## 2022-03-01 VITALS — BP 106/79 | HR 68 | Temp 98.7°F | Ht 65.0 in | Wt 213.0 lb

## 2022-03-01 DIAGNOSIS — Z7901 Long term (current) use of anticoagulants: Secondary | ICD-10-CM

## 2022-03-01 LAB — PROTIME-INR: INR: 4.9 — AB (ref 0.80–1.20)

## 2022-03-01 MED ORDER — WARFARIN SODIUM 3 MG PO TABS: ORAL_TABLET | ORAL | 1 refills | Status: DC

## 2022-03-01 NOTE — Assessment & Plan Note (Signed)
INR today 4.9. pt is particular about managing her dosing. Typically does M-4 mg T-'4mg'$  W-3 mg T-3 mg and F-4 mg with nothing on the weekends. She ate collard greens this past week. Advised today, 3 mg, and the next two days 3 mg and then back to her typical dosing.  Again discussed Eliquis for ease of use, less bleeding risk, fewer OV. Pt will consider.

## 2022-03-01 NOTE — Progress Notes (Signed)
Established patient visit   Patient: Tracy Mann   DOB: Aug 14, 1933   89 y.o. Female  MRN: [id number] Visit Date: 03/01/2022  Today's healthcare provider: [name], PA-C     Subjective    HPI  Pt presents for INR and to discuss Eliquis.   Medications: Outpatient Medications Prior to Visit  Medication Sig   albuterol (VENTOLIN HFA) 108 (90 Base) MCG/ACT inhaler TAKE 2 PUFFS BY MOUTH EVERY 6 HOURS AS NEEDED FOR WHEEZE OR SHORTNESS OF BREATH   Continuous Blood Gluc Receiver (FREESTYLE LIBRE 3 READER) DEVI 1 Device by Does not apply route daily. Use to check glucose continuously   Continuous Blood Gluc Sensor (FREESTYLE LIBRE 3 SENSOR) MISC Place 1 sensor on the skin every 14 days. Use to check glucose continuously   ezetimibe (ZETIA) 10 MG tablet Take 1 tablet (10 mg total) by mouth daily.   flavoxATE (URISPAS) 100 MG tablet Take 1 tablet (100 mg total) by mouth 2 (two) times daily as needed for bladder spasms.   fluticasone (FLONASE) 50 MCG/ACT nasal spray Place 2 sprays into both nostrils daily.   HYDROcodone-acetaminophen (NORCO) 10-325 MG tablet Take 1 tablet by mouth every 8 (eight) hours as needed for severe pain. Must last 30 days.   [START ON 03/27/2022] HYDROcodone-acetaminophen (NORCO) 10-325 MG tablet Take 1 tablet by mouth every 8 (eight) hours as needed for severe pain. Must last 30 days.   insulin glargine (LANTUS SOLOSTAR) 100 UNIT/ML Solostar Pen Inject 24 Units into the skin at bedtime.   insulin lispro (HUMALOG) 100 UNIT/ML KwikPen Inject 10-25 Units into the skin in the morning, at noon, and at bedtime.   Insulin Pen Needle (RELION PEN NEEDLE 31G/8MM) 31G X 8 MM MISC Use with insulin pen three times daily   levothyroxine (SYNTHROID) 25 MCG tablet Take 25 mcg by mouth daily before breakfast.   lisinopril (ZESTRIL) 10 MG tablet TAKE 1 TABLET BY MOUTH EVERY DAY   LORazepam (ATIVAN) 1 MG tablet TAKE 1 TABLET(1 MG) BY MOUTH TWICE DAILY AS NEEDED   Magnesium  500 MG CAPS Take 1,000 mg by mouth daily.    metolazone (ZAROXOLYN) 2.5 MG tablet Take 1 tablet (2.5 mg total) by mouth as directed. Take two times a week with torsemide 40 mg   naloxone (NARCAN) nasal spray 4 mg/0.1 mL Place 1 spray into the nose as needed for up to 365 doses (for opioid-induced respiratory depresssion). In case of emergency (overdose), spray once into each nostril. If no response within 3 minutes, repeat application and call [phone].   nitroGLYCERIN (NITROSTAT) 0.4 MG SL tablet Place 1 tablet (0.4 mg total) under the tongue every 5 (five) minutes as needed for chest pain.   NONFORMULARY OR COMPOUNDED ITEM See pharmacy note- Not to use on any open skin or wound.   ondansetron (ZOFRAN) 4 MG tablet Take 1 tablet (4 mg total) by mouth every 12 (twelve) hours as needed for nausea or vomiting.   polyethylene glycol powder (GLYCOLAX/MIRALAX) 17 GM/SCOOP powder Take 255 g by mouth daily.   potassium chloride SA (KLOR-CON) 20 MEQ tablet Take 1 tablet (20 mEq total) by mouth daily.   rOPINIRole (REQUIP) 0.25 MG tablet TAKE 1 TABLET BY MOUTH EVERYDAY AT BEDTIME   torsemide (DEMADEX) 20 MG tablet Take 2 tablets (40 mg total) by mouth daily.   warfarin (COUMADIN) 4 MG tablet Take daily or as directed by physician   [DISCONTINUED] apixaban (ELIQUIS) 2.5 MG TABS tablet Take 1 tablet (  2.5 mg total) by mouth 2 (two) times daily.   HYDROcodone-acetaminophen (NORCO) 10-325 MG tablet Take 1 tablet by mouth every 8 (eight) hours as needed for severe pain. Must last 30 days.   No facility-administered medications prior to visit.       Objective    BP 106/79   Pulse 68   Temp 98.7 F (37.1 C)   Ht '5\' 5"'$  (1.651 m)   Wt 213 lb (96.6 kg)   BMI 35.45 kg/m    Physical Exam Vitals reviewed.  Constitutional:      Appearance: She is not ill-appearing.  HENT:     Head: Normocephalic.  Eyes:     Conjunctiva/sclera: Conjunctivae normal.  Cardiovascular:     Rate and Rhythm: Normal rate.   Pulmonary:     Effort: Pulmonary effort is normal. No respiratory distress.  Neurological:     General: No focal deficit present.     Mental Status: She is alert and oriented to person, place, and time.  Psychiatric:        Mood and Affect: Mood normal.        Behavior: Behavior normal.       Results for orders placed or performed in visit on 03/01/22  INR/PT  Result Value Ref Range   INR 4.90 (A) 0.80 - 1.20    Assessment & Plan     Problem List Items Addressed This Visit       Other   Anticoagulation monitoring, INR range 2-3 - Primary    INR today 4.9. pt is particular about managing her dosing. Typically does M-4 mg T-'4mg'$  W-3 mg T-3 mg and F-4 mg with nothing on the weekends. She ate collard greens this past week. Advised today, 3 mg, and the next two days 3 mg and then back to her typical dosing.  Again discussed Eliquis for ease of use, less bleeding risk, fewer OV. Pt will consider.      Relevant Medications   warfarin (COUMADIN) 3 MG tablet   Other Relevant Orders   INR/PT (Completed)     Return in about 2 weeks (around 03/15/2022) for INR.      I, [name], PA-C have reviewed all documentation for this visit. The documentation on  03/01/22  for the exam, diagnosis, procedures, and orders are all accurate and complete.  [name], PA-C [facility] [address] [place], [place], [address] Office: [phone] Fax: [place]

## 2022-03-14 NOTE — Progress Notes (Unsigned)
I,Sulibeya S Dimas,acting as a Education administrator for [employer], PA-C.,have documented all relevant documentation on the behalf of Tracy Kirschner, PA-C,as directed by  Tracy Kirschner, PA-C while in the presence of Tracy Kirschner, PA-C.     Established patient visit   Patient: Tracy Mann   DOB: 24-Jan-1934   89 y.o. Female  MRN: [id number] Visit Date: 03/15/2022  Today's healthcare provider: [name], PA-C   No chief complaint on file.  Subjective    HPI  ***  Medications: Outpatient Medications Prior to Visit  Medication Sig   albuterol (VENTOLIN HFA) 108 (90 Base) MCG/ACT inhaler TAKE 2 PUFFS BY MOUTH EVERY 6 HOURS AS NEEDED FOR WHEEZE OR SHORTNESS OF BREATH   Continuous Blood Gluc Receiver (FREESTYLE LIBRE 3 READER) DEVI 1 Device by Does not apply route daily. Use to check glucose continuously   Continuous Blood Gluc Sensor (FREESTYLE LIBRE 3 SENSOR) MISC Place 1 sensor on the skin every 14 days. Use to check glucose continuously   ezetimibe (ZETIA) 10 MG tablet Take 1 tablet (10 mg total) by mouth daily.   flavoxATE (URISPAS) 100 MG tablet Take 1 tablet (100 mg total) by mouth 2 (two) times daily as needed for bladder spasms.   fluticasone (FLONASE) 50 MCG/ACT nasal spray Place 2 sprays into both nostrils daily.   HYDROcodone-acetaminophen (NORCO) 10-325 MG tablet Take 1 tablet by mouth every 8 (eight) hours as needed for severe pain. Must last 30 days.   HYDROcodone-acetaminophen (NORCO) 10-325 MG tablet Take 1 tablet by mouth every 8 (eight) hours as needed for severe pain. Must last 30 days.   [START ON 03/27/2022] HYDROcodone-acetaminophen (NORCO) 10-325 MG tablet Take 1 tablet by mouth every 8 (eight) hours as needed for severe pain. Must last 30 days.   insulin glargine (LANTUS SOLOSTAR) 100 UNIT/ML Solostar Pen Inject 24 Units into the skin at bedtime.   insulin lispro (HUMALOG) 100 UNIT/ML KwikPen Inject 10-25 Units into the skin in the morning, at noon, and at bedtime.    Insulin Pen Needle (RELION PEN NEEDLE 31G/8MM) 31G X 8 MM MISC Use with insulin pen three times daily   levothyroxine (SYNTHROID) 25 MCG tablet Take 25 mcg by mouth daily before breakfast.   lisinopril (ZESTRIL) 10 MG tablet TAKE 1 TABLET BY MOUTH EVERY DAY   LORazepam (ATIVAN) 1 MG tablet TAKE 1 TABLET(1 MG) BY MOUTH TWICE DAILY AS NEEDED   Magnesium 500 MG CAPS Take 1,000 mg by mouth daily.    metolazone (ZAROXOLYN) 2.5 MG tablet Take 1 tablet (2.5 mg total) by mouth as directed. Take two times a week with torsemide 40 mg   naloxone (NARCAN) nasal spray 4 mg/0.1 mL Place 1 spray into the nose as needed for up to 365 doses (for opioid-induced respiratory depresssion). In case of emergency (overdose), spray once into each nostril. If no response within 3 minutes, repeat application and call [phone].   nitroGLYCERIN (NITROSTAT) 0.4 MG SL tablet Place 1 tablet (0.4 mg total) under the tongue every 5 (five) minutes as needed for chest pain.   NONFORMULARY OR COMPOUNDED ITEM See pharmacy note- Not to use on any open skin or wound.   ondansetron (ZOFRAN) 4 MG tablet Take 1 tablet (4 mg total) by mouth every 12 (twelve) hours as needed for nausea or vomiting.   polyethylene glycol powder (GLYCOLAX/MIRALAX) 17 GM/SCOOP powder Take 255 g by mouth daily.   potassium chloride SA (KLOR-CON) 20 MEQ tablet Take 1 tablet (20 mEq total) by mouth daily.  rOPINIRole (REQUIP) 0.25 MG tablet TAKE 1 TABLET BY MOUTH EVERYDAY AT BEDTIME   torsemide (DEMADEX) 20 MG tablet Take 2 tablets (40 mg total) by mouth daily.   warfarin (COUMADIN) 3 MG tablet Alternate with 4 mg.   warfarin (COUMADIN) 4 MG tablet Take daily or as directed by physician   No facility-administered medications prior to visit.    Review of Systems  {Labs  Heme  Chem  Endocrine  Serology  Results Review (optional):23779}   Objective    There were no vitals taken for this visit. {Show previous vital signs (optional):23777}  Physical Exam   ***  No results found for any visits on 03/15/22.  Assessment & Plan     ***  No follow-ups on file.      {provider attestation***:1}   Tracy Kirschner, PA-C  [place] [phone] (phone) [phone] (fax)  [place]

## 2022-03-15 ENCOUNTER — Telehealth: Payer: Self-pay | Admitting: Cardiovascular Disease

## 2022-03-15 ENCOUNTER — Encounter: Payer: Self-pay | Admitting: Physician Assistant

## 2022-03-15 ENCOUNTER — Ambulatory Visit: Payer: Medicare Other | Admitting: Physician Assistant

## 2022-03-15 VITALS — BP 107/52 | HR 77 | Temp 97.7°F | Resp 24 | Wt 215.0 lb

## 2022-03-15 DIAGNOSIS — I482 Chronic atrial fibrillation, unspecified: Secondary | ICD-10-CM | POA: Diagnosis not present

## 2022-03-15 DIAGNOSIS — I119 Hypertensive heart disease without heart failure: Secondary | ICD-10-CM | POA: Diagnosis not present

## 2022-03-15 DIAGNOSIS — E1122 Type 2 diabetes mellitus with diabetic chronic kidney disease: Secondary | ICD-10-CM | POA: Diagnosis not present

## 2022-03-15 LAB — POCT INR
INR: 1.4 — AB (ref 2.0–3.0)
POC INR: 1.4
PT: 16.3

## 2022-03-15 NOTE — Assessment & Plan Note (Signed)
Will repeat A1c given lethargy and next endo appt is not until 5/24.  Her last A1c was 7.0

## 2022-03-15 NOTE — Assessment & Plan Note (Signed)
Pt concerned over hypotension but on review of vitals this is around her typical BP Referred back to cardiology given symptoms of SOB and lethargy

## 2022-03-15 NOTE — Assessment & Plan Note (Signed)
Again stressed switching to eliquis as pt frequently changes her dosing of warfarin herself. Pt is open to switching INR today was 1.4, advised adhering to her warfarin schedule without changes for the next week and we will recheck. Can start eliquis at 2.

## 2022-03-15 NOTE — Telephone Encounter (Signed)
Received a secure chat from [employer]. She is requesting a f/u with Dr. Rockey Situ be scheduled due to worsening sob and lethargy. She also advised "in the process of transitioning her from coumadin to eliquis just waiting on an in range INR"   Left VM for patient to callback and schedule f/u.

## 2022-03-16 ENCOUNTER — Other Ambulatory Visit: Payer: Self-pay | Admitting: Physician Assistant

## 2022-03-16 DIAGNOSIS — E1122 Type 2 diabetes mellitus with diabetic chronic kidney disease: Secondary | ICD-10-CM

## 2022-03-16 LAB — CBC WITH DIFFERENTIAL/PLATELET
Basophils Absolute: 0.1 10*3/uL (ref 0.0–0.2)
Basos: 1 %
EOS (ABSOLUTE): 0.3 10*3/uL (ref 0.0–0.4)
Eos: 4 %
Hematocrit: 35.8 % (ref 34.0–46.6)
Hemoglobin: 12.2 g/dL (ref 11.1–15.9)
Immature Grans (Abs): 0 10*3/uL (ref 0.0–0.1)
Immature Granulocytes: 0 %
Lymphocytes Absolute: 1.9 10*3/uL (ref 0.7–3.1)
Lymphs: 22 %
MCH: 30.7 pg (ref 26.6–33.0)
MCHC: 34.1 g/dL (ref 31.5–35.7)
MCV: 90 fL (ref 79–97)
Monocytes Absolute: 0.7 10*3/uL (ref 0.1–0.9)
Monocytes: 7 %
Neutrophils Absolute: 5.9 10*3/uL (ref 1.4–7.0)
Neutrophils: 66 %
Platelets: 206 10*3/uL (ref 150–450)
RBC: 3.98 x10E6/uL (ref 3.77–5.28)
RDW: 11.9 % (ref 11.7–15.4)
WBC: 8.9 10*3/uL (ref 3.4–10.8)

## 2022-03-16 LAB — COMPREHENSIVE METABOLIC PANEL
ALT: 10 IU/L (ref 0–32)
AST: 20 IU/L (ref 0–40)
Albumin/Globulin Ratio: 1.6 (ref 1.2–2.2)
Albumin: 4.1 g/dL (ref 3.7–4.7)
Alkaline Phosphatase: 114 IU/L (ref 44–121)
BUN/Creatinine Ratio: 23 (ref 12–28)
BUN: 31 mg/dL — ABNORMAL HIGH (ref 8–27)
Bilirubin Total: 0.4 mg/dL (ref 0.0–1.2)
CO2: 25 mmol/L (ref 20–29)
Calcium: 9.2 mg/dL (ref 8.7–10.3)
Chloride: 101 mmol/L (ref 96–106)
Creatinine, Ser: 1.32 mg/dL — ABNORMAL HIGH (ref 0.57–1.00)
Globulin, Total: 2.6 g/dL (ref 1.5–4.5)
Glucose: 210 mg/dL — ABNORMAL HIGH (ref 70–99)
Potassium: 4.7 mmol/L (ref 3.5–5.2)
Sodium: 141 mmol/L (ref 134–144)
Total Protein: 6.7 g/dL (ref 6.0–8.5)
eGFR: 39 mL/min/{1.73_m2} — ABNORMAL LOW (ref >59)

## 2022-03-16 LAB — HEMOGLOBIN A1C
Est. average glucose Bld gHb Est-mCnc: 154 mg/dL
Hgb A1c MFr Bld: 7 % — ABNORMAL HIGH (ref 4.8–5.6)

## 2022-03-16 NOTE — Telephone Encounter (Signed)
I called and spoke with the patient.  I advised her we had received a call from her PCP office to scheduled a follow up with Dr. Rockey Situ for increased SOB/ lethargy.  Per the patient she noticed a cough/ congestion a few weeks ago. She has followed with [name], PA for this, but per her appointment yesterday, she did confirm Ria Comment would like her to follow with cardiology for her breathing & fatigue.  Ms. Naro advised that her SOB is somewhat increased as well as her fatigue, but it was not to the point she felt she needed to contact Dr. Rockey Situ, however, she is not sure what she should do at this point regarding a follow up appointment.   She mentioned that Ria Comment also inquired if she has seen a Pulmonary MD, but the patient confirmed she has not.   Reviewed Ria Comment, PA's note from 03/15/22: Benign hypertensive heart disease without congestive heart failure       Pt concerned over hypotension but on review of vitals this is around her typical BP Referred back to cardiology given symptoms of SOB and lethargy      The patient is due to see Ria Comment, [place] back in the office on 03/22/22 as she is trying to transition from warfarin to eliquis due to variable INR's.   I have advised the patient that I can schedule her to see Dr. Rockey Situ on Monday 03/20/22 at 11:00 am to review her symptoms and have this discussion prior to seeing Ria Comment back on 03/22/22.   The patient voices understanding and is agreeable. She is in no audible distress at this time.

## 2022-03-19 NOTE — Progress Notes (Unsigned)
Cardiology Office Note  Date:  03/20/2022   ID:  [name], [name] 29-Nov-1933, MRN [id number]  PCP:  [name], PA-C   Chief Complaint  Patient presents with   Shortness of Breath    Patient c/o an increase in shortness of breath, twinge in chest at times, bilateral LE edema and fatigue. Medications reviewed by the patient verbally.     HPI:  [name] is a 89 year old woman with a PMH significant for  chronic atrial fibrillation on Coumadin therapy,  diastolic and systolic dysfunction, EF 40 to 45% on echo 01/2018  hypertension,  hyperlipidemia,  morbid obesity,   moderate pulmonary hypertension inferior wall myocardial infarction felt to be secondary to a thrombus from atrial fib in October 2007 at [facility],  episodes of chest pain  Ejection fraction 30 to 35%, moderately elevated right heart pressures, July 16, 2020 following COVID Medication intolerances EF 30 to 35% on echo 05/18/21 who presents for followup Of her coronary artery disease and leg edema., persistent atrial fibrillation, cardiomyopathy  Last seen by myself November 2023 In follow-up today reports that she feels well, denies significant fluid retention, no leg swelling, no abdominal distention, no significant cough Reports having small amount of wheezing  Labs reviewed: CR 1.32, stable renal function compliant with torsemide 40 daily, metolazone 2.5 mg two times a week  Weight stable 209---210 Up from 202 in Aug 2023 Denies significant leg swelling, no PND orthopnea  Trying to lose weight, low carb diet Walks with walker  Labs reviewed A1C 7.0 in 03/15/22  EKG personally reviewed by myself on todays visit Atrial fibrillation rate 88 bpm interventricular conduction delay, left axis deviation  Prior medication intolerances Stopped metoprolol, had dizzy spell/near syncope intolerances to [place], carvedilol unable to tolerate [place], makes her sneeze, cough 30 minutes after  taking it with a runny nose and eye watering  Prior imaging reviewed Echocardiogram April 19 with ejection fraction 30 to 35% Moderate to severely elevated right heart pressures Severe biatrial enlargement moderate to severe MR No dramatic changes compared to study dated June 22  Previous attempt to use Carvedilol 3.25 twice daily started, Entresto 24/26 unsuccessful secondary to intolerance Went back on lisinopril, stopped the coreg  COVID infection early 2022  Does not use her lymphedema compression pumps  HBA1C 9.1 on labs 12/2018 HBA1C 8.3 on 05/20/2019  PMH:   has a past medical history of Acute myocardial infarction of inferior wall ([place]), Arthritis, Chronic anticoagulation, Chronic atrial fibrillation (HCC), Chronic combined systolic and diastolic CHF (congestive heart failure) ([place]), Essential hypertension, Hyperlipidemia, mixed, Mixed Ischemic/Nonischemic Cardiomyopathy, Nonobstructive coronary artery disease, Obesity, Thyroid disease, Type II diabetes mellitus ([place]), Venous insufficiency, and Vitamin D deficiency.  PSH:    Past Surgical History:  Procedure Laterality Date   CARDIAC CATHETERIZATION  11/2012   [facility];EF 45-50%   CHOLECYSTECTOMY  1999   TUBAL LIGATION  1968   VESICOVAGINAL FISTULA CLOSURE W/ TAH  1996    Current Outpatient Medications  Medication Sig Dispense Refill   albuterol (VENTOLIN HFA) 108 (90 Base) MCG/ACT inhaler TAKE 2 PUFFS BY MOUTH EVERY 6 HOURS AS NEEDED FOR WHEEZE OR SHORTNESS OF BREATH 8.5 each 2   ezetimibe (ZETIA) 10 MG tablet Take 1 tablet (10 mg total) by mouth daily. 90 tablet 2   flavoxATE (URISPAS) 100 MG tablet Take 1 tablet (100 mg total) by mouth 2 (two) times daily as needed for bladder spasms. 60 tablet 11   fluticasone (FLONASE) 50 MCG/ACT nasal  spray Place 2 sprays into both nostrils daily. 48 mL 3   HYDROcodone-acetaminophen (NORCO) 10-325 MG tablet Take 1 tablet by mouth every 8 (eight) hours as needed for severe pain. Must last  30 days. 60 tablet 0   insulin glargine (LANTUS SOLOSTAR) 100 UNIT/ML Solostar Pen Inject 24 Units into the skin at bedtime.     insulin lispro (HUMALOG) 100 UNIT/ML KwikPen Inject 10-25 Units into the skin in the morning, at noon, and at bedtime.     Insulin Pen Needle (RELION PEN NEEDLE 31G/8MM) 31G X 8 MM MISC Use with insulin pen three times daily 100 each 12   levothyroxine (SYNTHROID) 25 MCG tablet Take 25 mcg by mouth daily before breakfast.     lisinopril (ZESTRIL) 10 MG tablet TAKE 1 TABLET BY MOUTH EVERY DAY 90 tablet 3   LORazepam (ATIVAN) 1 MG tablet TAKE 1 TABLET(1 MG) BY MOUTH TWICE DAILY AS NEEDED 60 tablet 5   Magnesium 500 MG CAPS Take 1,000 mg by mouth daily.      metolazone (ZAROXOLYN) 2.5 MG tablet Take 1 tablet (2.5 mg total) by mouth as directed. Take two times a week with torsemide 40 mg 90 tablet 1   naloxone (NARCAN) nasal spray 4 mg/0.1 mL Place 1 spray into the nose as needed for up to 365 doses (for opioid-induced respiratory depresssion). In case of emergency (overdose), spray once into each nostril. If no response within 3 minutes, repeat application and call [id number]. 1 each 0   nitroGLYCERIN (NITROSTAT) 0.4 MG SL tablet Place 1 tablet (0.4 mg total) under the tongue every 5 (five) minutes as needed for chest pain. 25 tablet 1   NONFORMULARY OR COMPOUNDED ITEM See pharmacy note- Not to use on any open skin or wound. 120 each 3   ondansetron (ZOFRAN) 4 MG tablet Take 1 tablet (4 mg total) by mouth every 12 (twelve) hours as needed for nausea or vomiting. 60 tablet 2   polyethylene glycol powder (GLYCOLAX/MIRALAX) 17 GM/SCOOP powder Take 255 g by mouth daily. 238 g 0   potassium chloride SA (KLOR-CON) 20 MEQ tablet Take 1 tablet (20 mEq total) by mouth daily. 90 tablet 3   rOPINIRole (REQUIP) 0.25 MG tablet TAKE 1 TABLET BY MOUTH EVERYDAY AT BEDTIME 90 tablet 1   torsemide (DEMADEX) 20 MG tablet Take 2 tablets (40 mg total) by mouth daily. 60 tablet 6   apixaban (ELIQUIS) 5  MG TABS tablet Take 1 tablet (5 mg total) by mouth 2 (two) times daily. 180 tablet 3   Continuous Blood Gluc Receiver (FREESTYLE LIBRE 3 READER) DEVI 1 Device by Does not apply route daily. Use to check glucose continuously (Patient not taking: Reported on 03/20/2022) 1 each 0   Continuous Blood Gluc Sensor (FREESTYLE LIBRE 3 SENSOR) MISC Place 1 sensor on the skin every 14 days. Use to check glucose continuously (Patient not taking: Reported on 03/20/2022) 2 each 12   HYDROcodone-acetaminophen (NORCO) 10-325 MG tablet Take 1 tablet by mouth every 8 (eight) hours as needed for severe pain. Must last 30 days. (Patient not taking: Reported on 03/20/2022) 60 tablet 0   [START ON 03/27/2022] HYDROcodone-acetaminophen (NORCO) 10-325 MG tablet Take 1 tablet by mouth every 8 (eight) hours as needed for severe pain. Must last 30 days. (Patient not taking: Reported on 03/20/2022) 60 tablet 0   No current facility-administered medications for this visit.    Allergies:   Duloxetine, Paroxetine, Sulfa antibiotics, Cephalexin, Clarithromycin, Diphenhydramine, Estrogens conjugated, and Nitrofurantoin  Social History:  The patient  reports that she has never smoked. She has never used smokeless tobacco. She reports that she does not drink alcohol and does not use drugs.   Family History:   family history includes Heart disease in her mother; Thyroid disease in her father.    Review of Systems: Review of Systems  Constitutional: Negative.   HENT: Negative.    Respiratory: Negative.    Cardiovascular: Negative.   Gastrointestinal: Negative.   Musculoskeletal: Negative.        Leg weakness  Neurological: Negative.   Psychiatric/Behavioral: Negative.    All other systems reviewed and are negative.   PHYSICAL EXAM: VS:  BP 100/60 (BP Location: Left Arm, Patient Position: Sitting, Cuff Size: Normal)   Pulse 88   Ht 5' 5.5" (1.664 m)   Wt 210 lb 6 oz (95.4 kg)   SpO2 96%   BMI 34.48 kg/m  , BMI Body mass  index is 34.48 kg/m. Constitutional:  oriented to person, place, and time. No distress.  HENT:  Head: Grossly normal Eyes:  no discharge. No scleral icterus.  Neck: No JVD, no carotid bruits  Cardiovascular: Irregularly irregular, no murmurs appreciated Pulmonary/Chest: Clear to auscultation bilaterally, no wheezes or rails Abdominal: Soft.  no distension.  no tenderness.  Musculoskeletal: Normal range of motion Neurological:  normal muscle tone. Coordination normal. No atrophy Skin: Skin warm and dry Psychiatric: normal affect, pleasant   Recent Labs: 06/21/2021: NT-Pro BNP 1,723 09/19/2021: B Natriuretic Peptide 83.4 03/15/2022: ALT 10; BUN 31; Creatinine, Ser 1.32; Hemoglobin 12.2; Platelets 206; Potassium 4.7; Sodium 141    Lipid Panel Wt Readings from Last 3 Encounters:  03/20/22 210 lb 6 oz (95.4 kg)  03/15/22 215 lb (97.5 kg)  03/01/22 213 lb (96.6 kg)     ASSESSMENT AND PLAN:  Acute on chronic diastolic and systolic CHF Weight stable over the past 3 months 210 pounds Stable renal function Recommend she continue torsemide 40 daily with potassium 20 with metolazone 2.5 mg twice a week, Numerous medication intolerances, did not tolerate carvedilol, metoprolol held for near-syncope, did not tolerate Entresto  tolerating lisinopril 10 with her diuretic as above Consider extra torsemide 40 mg after lunch for weight gain, shortness of breath, abdominal swelling (would take extra torsemide sparingly no more than 2 or 3 times a week)  Cardiomyopathy Last evaluation April 2023 ejection fraction 30 to 35% Previous attempts at goal-directed medical therapy unsuccessful secondary to medication intolerances did not tolerate Farxiga, has not tolerated Entresto, spironolactone, Coreg Stressed importance of diuretic compliance Appears she is doing well on current regiment  Mixed  hyperlipidemia Continue Zetia Statin intolerance, no changes made  Essential hypertension   Lisinopril  10 daily, diuretic as above She previously stopped metoprolol for near-syncope Medication intolerances  Chronic atrial fibrillation (HCC)  A. fib  contributing to CHF On warfarin, after long discussion she will change to Eliquis 5 twice daily Recommend she stop warfarin and in 3 days start Eliquis 5 twice daily Rate relatively well controlled , she previously held metoprolol  Obstructive sleep apnea Previously reported this was "not a major issue" Currently not on CPAP  Type 2 diabetes mellitus without complication, with long-term current use of insulin (HCC) Recommend low carbohydrate diet, walking program Followed by endocrine, could consider Ozempic/Mounjaro    Total encounter time more than 30 minutes  Greater than 50% was spent in counseling and coordination of care with the patient   Orders Placed This Encounter  Procedures  EKG 12-Lead     Signed, Esmond Plants, M.D., Ph.D. 03/20/2022  [place], [place]

## 2022-03-20 ENCOUNTER — Ambulatory Visit: Payer: Medicare Other | Attending: Cardiovascular Disease | Admitting: Cardiovascular Disease

## 2022-03-20 ENCOUNTER — Encounter: Payer: Self-pay | Admitting: Cardiovascular Disease

## 2022-03-20 VITALS — BP 100/60 | HR 88 | Ht 65.5 in | Wt 210.4 lb

## 2022-03-20 DIAGNOSIS — E782 Mixed hyperlipidemia: Secondary | ICD-10-CM

## 2022-03-20 DIAGNOSIS — I25118 Atherosclerotic heart disease of native coronary artery with other forms of angina pectoris: Secondary | ICD-10-CM

## 2022-03-20 DIAGNOSIS — Z789 Other specified health status: Secondary | ICD-10-CM

## 2022-03-20 DIAGNOSIS — I5042 Chronic combined systolic (congestive) and diastolic (congestive) heart failure: Secondary | ICD-10-CM

## 2022-03-20 DIAGNOSIS — I739 Peripheral vascular disease, unspecified: Secondary | ICD-10-CM

## 2022-03-20 DIAGNOSIS — I340 Nonrheumatic mitral (valve) insufficiency: Secondary | ICD-10-CM | POA: Diagnosis not present

## 2022-03-20 DIAGNOSIS — G4733 Obstructive sleep apnea (adult) (pediatric): Secondary | ICD-10-CM

## 2022-03-20 DIAGNOSIS — I482 Chronic atrial fibrillation, unspecified: Secondary | ICD-10-CM

## 2022-03-20 DIAGNOSIS — R55 Syncope and collapse: Secondary | ICD-10-CM | POA: Diagnosis not present

## 2022-03-20 DIAGNOSIS — R0609 Other forms of dyspnea: Secondary | ICD-10-CM

## 2022-03-20 MED ORDER — APIXABAN 5 MG PO TABS: 5.0000 mg | ORAL_TABLET | Freq: Two times a day (BID) | ORAL | 3 refills | Status: DC

## 2022-03-20 NOTE — Patient Instructions (Addendum)
Medication Instructions:  Stop the warfarin  Then Start eliquis 5 mg twice a day starting Thursday morning  If you need a refill on your cardiac medications before your next appointment, please call your pharmacy.   Lab work: No new labs needed  Testing/Procedures: No new testing needed  Follow-Up: At [facility], you and your health needs are our priority.  As part of our continuing mission to provide you with exceptional heart care, we have created designated Provider Care Teams.  These Care Teams include your primary Cardiologist (physician) and Advanced Practice Providers (APPs -  Physician Assistants and Nurse Practitioners) who all work together to provide you with the care you need, when you need it.  You will need a follow up appointment in 3 months  Providers on your designated Care Team:   Murray Hodgkins, NP Christell Faith, PA-C Cadence Kathlen Mody, [place]  COVID-19 Vaccine Information can be found at: [email or web address].uk For questions related to vaccine distribution or appointments, please email [email or web address] or call 5754029628.

## 2022-03-22 ENCOUNTER — Telehealth: Payer: Self-pay

## 2022-03-22 ENCOUNTER — Telehealth: Payer: Self-pay | Admitting: Physician Assistant

## 2022-03-22 ENCOUNTER — Ambulatory Visit: Payer: Medicare Other | Admitting: Physician Assistant

## 2022-03-22 NOTE — Telephone Encounter (Signed)
Contacted [name], PA office and made the aware Dr. Rockey Situ stopped pt's coumadin and started her on Eliquis 5 mg BID as they mange pt's INR.

## 2022-03-22 NOTE — Telephone Encounter (Signed)
Dr. Rockey Situ at Cardiology stopped pts coumadin and started her Eliquis 5MG 2xs daily / please advise

## 2022-03-23 NOTE — Telephone Encounter (Signed)
Yup, aware

## 2022-03-28 DIAGNOSIS — E669 Obesity, unspecified: Secondary | ICD-10-CM | POA: Insufficient documentation

## 2022-03-28 DIAGNOSIS — M1990 Unspecified osteoarthritis, unspecified site: Secondary | ICD-10-CM | POA: Insufficient documentation

## 2022-04-01 ENCOUNTER — Other Ambulatory Visit: Payer: Self-pay | Admitting: Cardiovascular Disease

## 2022-04-17 NOTE — Patient Instructions (Signed)
____________________________________________________________________________________________  Opioid Pain Medication Update  To: All patients taking opioid pain medications. (I.e.: hydrocodone, hydromorphone, oxycodone, oxymorphone, morphine, codeine, methadone, tapentadol, tramadol, buprenorphine, fentanyl, etc.)  Re: Updated review of side effects and adverse reactions of opioid analgesics, as well as new information about long term effects of this class of medications.  Direct risks of long-term opioid therapy are not limited to opioid addiction and overdose. Potential medical risks include serious fractures, breathing problems during sleep, hyperalgesia, immunosuppression, chronic constipation, bowel obstruction, myocardial infarction, and tooth decay secondary to xerostomia.  Unpredictable adverse effects that can occur even if you take your medication correctly: Cognitive impairment, respiratory depression, and death. Most people think that if they take their medication "correctly", and "as instructed", that they will be safe. Nothing could be farther from the truth. In reality, a significant amount of recorded deaths associated with the use of opioids has occurred in individuals that had taken the medication for a long time, and were taking their medication correctly. The following are examples of how this can happen: Patient taking his/her medication for a long time, as instructed, without any side effects, is given a certain antibiotic or another unrelated medication, which in turn triggers a "Drug-to-drug interaction" leading to disorientation, cognitive impairment, impaired reflexes, respiratory depression or an untoward event leading to serious bodily harm or injury, including death.  Patient taking his/her medication for a long time, as instructed, without any side effects, develops an acute impairment of liver and/or kidney function. This will lead to a rapid inability of the body to  breakdown and eliminate their pain medication, which will result in effects similar to an "overdose", but with the same medicine and dose that they had always taken. This again may lead to disorientation, cognitive impairment, impaired reflexes, respiratory depression or an untoward event leading to serious bodily harm or injury, including death.  A similar problem will occur with patients as they grow older and their liver and kidney function begins to decrease as part of the aging process.  Background information: Historically, the original case for using long-term opioid therapy to treat chronic noncancer pain was based on safety assumptions that subsequent experience has called into question. In 1996, the American Pain Society and the American Academy of Pain Medicine issued a consensus statement supporting long-term opioid therapy. This statement acknowledged the dangers of opioid prescribing but concluded that the risk for addiction was low; respiratory depression induced by opioids was short-lived, occurred mainly in opioid-naive patients, and was antagonized by pain; tolerance was not a common problem; and efforts to control diversion should not constrain opioid prescribing. This has now proven to be wrong. Experience regarding the risks for opioid addiction, misuse, and overdose in community practice has failed to support these assumptions.  According to the Centers for Disease Control and Prevention, fatal overdoses involving opioid analgesics have increased sharply over the past decade. Currently, more than 96,700 people die from drug overdoses every year. Opioids are a factor in 7 out of every 10 overdose deaths. Deaths from drug overdose have surpassed motor vehicle accidents as the leading cause of death for individuals between the ages of 35 and 54.  Clinical data suggest that neuroendocrine dysfunction may be very common in both men and women, potentially causing hypogonadism, erectile  dysfunction, infertility, decreased libido, osteoporosis, and depression. Recent studies linked higher opioid dose to increased opioid-related mortality. Controlled observational studies reported that long-term opioid therapy may be associated with increased risk for cardiovascular events. Subsequent meta-analysis concluded   that the safety of long-term opioid therapy in elderly patients has not been proven.   Side Effects and adverse reactions: Common side effects: Drowsiness (sedation). Dizziness. Nausea and vomiting. Constipation. Physical dependence -- Dependence often manifests with withdrawal symptoms when opioids are discontinued or decreased. Tolerance -- As you take repeated doses of opioids, you require increased medication to experience the same effect of pain relief. Respiratory depression -- This can occur in healthy people, especially with higher doses. However, people with COPD, asthma or other lung conditions may be even more susceptible to fatal respiratory impairment.  Uncommon side effects: An increased sensitivity to feeling pain and extreme response to pain (hyperalgesia). Chronic use of opioids can lead to this. Delayed gastric emptying (the process by which the contents of your stomach are moved into your small intestine). Muscle rigidity. Immune system and hormonal dysfunction. Quick, involuntary muscle jerks (myoclonus). Arrhythmia. Itchy skin (pruritus). Dry mouth (xerostomia).  Long-term side effects: Chronic constipation. Sleep-disordered breathing (SDB). Increased risk of bone fractures. Hypothalamic-pituitary-adrenal dysregulation. Increased risk of overdose.  RISKS: Fractures and Falls:  Opioids increase the risk and incidence of falls. This is of particular importance in elderly patients.  Endocrine System:  Long-term administration is associated with endocrine abnormalities (endocrinopathies). (Also known as Opioid-induced Endocrinopathy) Influences  on both the hypothalamic-pituitary-adrenal axis?and the hypothalamic-pituitary-gonadal axis have been demonstrated with consequent hypogonadism and adrenal insufficiency in both sexes. Hypogonadism and decreased levels of dehydroepiandrosterone sulfate have been reported in men and women. Endocrine effects include: Amenorrhoea in women (abnormal absence of menstruation) Reduced libido in both sexes Decreased sexual function Erectile dysfunction in men Hypogonadisms (decreased testicular function with shrinkage of testicles) Infertility Depression and fatigue Loss of muscle mass Anxiety Depression Immune suppression Hyperalgesia Weight gain Anemia Osteoporosis Patients (particularly women of childbearing age) should avoid opioids. There is insufficient evidence to recommend routine monitoring of asymptomatic patients taking opioids in the long-term for hormonal deficiencies.  Immune System: Human studies have demonstrated that opioids have an immunomodulating effect. These effects are mediated via opioid receptors both on immune effector cells and in the central nervous system. Opioids have been demonstrated to have adverse effects on antimicrobial response and anti-tumour surveillance. Buprenorphine has been demonstrated to have no impact on immune function.  Opioid Induced Hyperalgesia: Human studies have demonstrated that prolonged use of opioids can lead to a state of abnormal pain sensitivity, sometimes called opioid induced hyperalgesia (OIH). Opioid induced hyperalgesia is not usually seen in the absence of tolerance to opioid analgesia. Clinically, hyperalgesia may be diagnosed if the patient on long-term opioid therapy presents with increased pain. This might be qualitatively and anatomically distinct from pain related to disease progression or to breakthrough pain resulting from development of opioid tolerance. Pain associated with hyperalgesia tends to be more diffuse than the  pre-existing pain and less defined in quality. Management of opioid induced hyperalgesia requires opioid dose reduction.  Cancer: Chronic opioid therapy has been associated with an increased risk of cancer among noncancer patients with chronic pain. This association was more evident in chronic strong opioid users. Chronic opioid consumption causes significant pathological changes in the small intestine and colon. Epidemiological studies have found that there is a link between opium dependence and initiation of gastrointestinal cancers. Cancer is the second leading cause of death after cardiovascular disease. Chronic use of opioids can cause multiple conditions such as GERD, immunosuppression and renal damage as well as carcinogenic effects, which are associated with the incidence of cancers.   Mortality: Long-term opioid use   has been associated with increased mortality among patients with chronic non-cancer pain (CNCP).  Prescription of long-acting opioids for chronic noncancer pain was associated with a significantly increased risk of all-cause mortality, including deaths from causes other than overdose.  Reference: Von Korff M, Kolodny A, Deyo RA, Chou R. Long-term opioid therapy reconsidered. Ann Intern Med. 2011 Sep 6;155(5):325-8. doi: 10.7326/[id number]. PMID: 21893626; PMCID: PMC3280085. Bedson J, Chen Y, Ashworth J, Hayward RA, Dunn KM, Jordan KP. Risk of adverse events in patients prescribed long-term opioids: A cohort study in the [place] Clinical Practice Research Datalink. Eur J Pain. 2019 May;23(5):908-922. doi: 10.1002/ejp.1357. Epub 2019 Jan 31. PMID: 30620116. Colameco S, Coren JS, Ciervo CA. Continuous opioid treatment for chronic noncancer pain: a time for moderation in prescribing. Postgrad Med. 2009 Jul;121(4):61-6. doi: 10.3810/pgm.2009.07.2032. PMID: 19641271. Chou R, Turner JA, Devine EB, Hansen RN, Sullivan SD, Blazina I, Dana T, Bougatsos C, Deyo RA. The  effectiveness and risks of long-term opioid therapy for chronic pain: a systematic review for a National Institutes of Health Pathways to Prevention Workshop. Ann Intern Med. 2015 Feb 17;162(4):276-86. doi: 10.7326/M14-2559. PMID: 25581257. Warner M, Chen LH, Makuc DM. NCHS Data Brief No. 22. [place]: Centers for Disease Control and Prevention; 2009. Sep, Increase in Fatal Poisonings Involving Opioid Analgesics in the [place], 1999-2006. Song IA, Choi HR, Oh TK. Long-term opioid use and mortality in patients with chronic non-cancer pain: Ten-year follow-up study in [place] from 2010 through 2019. EClinicalMedicine. 2022 Jul 18;51:101558. doi: 10.1016/j.eclinm.2022.101558. PMID: 35875817; PMCID: PMC9304910. Huser, W., Schubert, T., Vogelmann, T. et al. All-cause mortality in patients with long-term opioid therapy compared with non-opioid analgesics for chronic non-cancer pain: a database study. BMC Med 18, 162 (2020). https://doi.org/10.1186/[email or web address] Rashidian H, Zendehdel K, Kamangar F, Malekzadeh R, Haghdoost AA. An Ecological Study of the Association between Opiate Use and Incidence of Cancers. Addict Health. 2016 Fall;8(4):252-260. PMID: 28819556; PMCID: PMC5554805.  Our Goal: Our goal is to control your pain with means other than the use of opioid pain medications.  Our Recommendation: Talk to your physician about coming off of these medications. We can assist you with the tapering down and stopping these medicines. Based on the new information, even if you cannot completely stop the medication, a decrease in the dose may be associated with a lesser risk. Ask for other means of controlling the pain. Decrease or eliminate those factors that significantly contribute to your pain such as smoking, obesity, and a diet heavily tilted towards "inflammatory" nutrients.  Last Updated: 03/29/2022    ____________________________________________________________________________________________     ____________________________________________________________________________________________  Patient Information update  To: All of our patients.  Re: Name change.  It has been made official that our current name, "[facility]"   will soon be changed to "[facility]".   The purpose of this change is to eliminate any confusion created by the concept of our practice being a "Medication Management Pain Clinic". In the past this has led to the misconception that we treat pain primarily by the use of prescription medications.  Nothing can be farther from the truth.   Understanding PAIN MANAGEMENT: To further understand what our practice does, you first have to understand that "Pain Management" is a subspecialty that requires additional training once a physician has completed their specialty training, which can be in either Anesthesia, Neurology, Psychiatry, or Physical Medicine and Rehabilitation (PMR). Each one of these contributes to the final approach taken by each physician to   the management of their patient's pain. To be a "Pain Management Specialist" you must have first completed one of the specialty trainings below.  Anesthesiologists - trained in clinical pharmacology and interventional techniques such as nerve blockade and regional as well as central neuroanatomy. They are trained to block pain before, during, and after surgical interventions.  Neurologists - trained in the diagnosis and pharmacological treatment of complex neurological conditions, such as Multiple Sclerosis, Parkinson's, spinal cord injuries, and other systemic conditions that may be associated with symptoms that may include but are not limited to pain. They tend to rely primarily on the treatment of chronic pain  using prescription medications.  Psychiatrist - trained in conditions affecting the psychosocial wellbeing of patients including but not limited to depression, anxiety, schizophrenia, personality disorders, addiction, and other substance use disorders that may be associated with chronic pain. They tend to rely primarily on the treatment of chronic pain using prescription medications.   Physical Medicine and Rehabilitation (PMR) physicians, also known as physiatrists - trained to treat a wide variety of medical conditions affecting the brain, spinal cord, nerves, bones, joints, ligaments, muscles, and tendons. Their training is primarily aimed at treating patients that have suffered injuries that have caused severe physical impairment. Their training is primarily aimed at the physical therapy and rehabilitation of those patients. They may also work alongside orthopedic surgeons or neurosurgeons using their expertise in assisting surgical patients to recover after their surgeries.  INTERVENTIONAL PAIN MANAGEMENT is sub-subspecialty of Pain Management.  Our physicians are Board-certified in Anesthesia, Pain Management, and Interventional Pain Management.  This meaning that not only have they been trained and Board-certified in their specialty of Anesthesia, and subspecialty of Pain Management, but they have also received further training in the sub-subspecialty of Interventional Pain Management, in order to become Board-certified as INTERVENTIONAL PAIN MANAGEMENT SPECIALIST.    Mission: Our goal is to use our skills in  INTERVENTIONAL PAIN MANAGEMENT as alternatives to the chronic use of prescription opioid medications for the treatment of pain. To make this more clear, we have changed our name to reflect what we do and offer. We will continue to offer medication management assessment and recommendations, but we will not be taking over any patient's medication  management.  ____________________________________________________________________________________________     ____________________________________________________________________________________________  National Pain Medication Shortage  The [place] is experiencing worsening drug shortages. These have had a negative widespread effect on patient care and treatment. Not expected to improve any time soon. Predicted to last past 2029.   Drug shortage list (generic names) Oxycodone IR Oxycodone/APAP Oxymorphone IR Hydromorphone Hydrocodone/APAP Morphine  Where is the problem?  Manufacturing and supply level.  Will this shortage affect you?  Only if you take any of the above pain medications.  How? You may be unable to fill your prescription.  Your pharmacist may offer a "partial fill" of your prescription. (Warning: Do not accept partial fills.) Prescriptions partially filled cannot be transferred to another pharmacy. Read our Medication Rules and Regulation. Depending on how much medicine you are dependent on, you may experience withdrawals when unable to get the medication.  Recommendations: Consider ending your dependence on opioid pain medications. Ask your pain specialist to assist you with the process. Consider switching to a medication currently not in shortage, such as Buprenorphine. Talk to your pain specialist about this option. Consider decreasing your pain medication requirements by managing tolerance thru "Drug Holidays". This may help minimize withdrawals, should you run out of medicine. Control your pain thru   the use of non-pharmacological interventional therapies.   Your prescriber: Prescribers cannot be blamed for shortages. Medication manufacturing and supply issues cannot be fixed by the prescriber.   NOTE: The prescriber is not responsible for supplying the medication, or solving supply issues. Work with your pharmacist to solve it. The patient is responsible for  the decision to take or continue taking the medication and for identifying and securing a legal supply source. By law, supplying the medication is the job and responsibility of the pharmacy. The prescriber is responsible for the evaluation, monitoring, and prescribing of these medications.   Prescribers will NOT: Re-issue prescriptions that have been partially filled. Re-issue prescriptions already sent to a pharmacy.  Re-send prescriptions to a different pharmacy because yours did not have your medication. Ask pharmacist to order more medicine or transfer the prescription to another pharmacy. (Read below.)  New 2023 regulation: "September 30, 2021 Revised Regulation Allows DEA-Registered Pharmacies to Transfer Electronic Prescriptions at a Patient's Request DEA Headquarters Division - Public Information Office Patients now have the ability to request their electronic prescription be transferred to another pharmacy without having to go back to their practitioner to initiate the request. This revised regulation went into effect on Monday, September 26, 2021.     At a patient's request, a DEA-registered retail pharmacy can now transfer an electronic prescription for a controlled substance (schedules II-V) to another DEA-registered retail pharmacy. Prior to this change, patients would have to go through their practitioner to cancel their prescription and have it re-issued to a different pharmacy. The process was taxing and time consuming for both patients and practitioners.    The Drug Enforcement Administration (DEA) published its intent to revise the process for transferring electronic prescriptions on December 19, 2019.  The final rule was published in the federal register on August 25, 2021 and went into effect 30 days later.  Under the final rule, a prescription can only be transferred once between pharmacies, and only if allowed under existing state or other applicable law. The prescription must  remain in its electronic form; may not be altered in any way; and the transfer must be communicated directly between two licensed pharmacists. It's important to note, any authorized refills transfer with the original prescription, which means the entire prescription will be filled at the same pharmacy".  Reference: https://www.dea.gov/stories/2023/2023-09/2021-09-01/revised-regulation-allows-dea-registered-pharmacies-transfer (DEA website announcement)  https://www.govinfo.gov/content/pkg/FR-2021-08-25/pdf/2023-15847.pdf (Federal Register  Department of Justice)   Federal Register / Vol. 88, No. 143 / Thursday, August 25, 2021 / Rules and Regulations DEPARTMENT OF JUSTICE  Drug Enforcement Administration  21 CFR Part 1306  [Docket No. [id number]]  RIN [id number]-AB64 Transfer of Electronic Prescriptions for Schedules II-V Controlled Substances Between Pharmacies for Initial Filling  ____________________________________________________________________________________________     ____________________________________________________________________________________________  Transfer of Pain Medication between Pharmacies  Re: 2023 DEA Clarification on existing regulation  Published on DEA Website: September 30, 2021  Title: Revised Regulation Allows DEA-Registered Pharmacies to Transfer Electronic Prescriptions at a Patient's Request DEA Headquarters Division - Public Information Office  "Patients now have the ability to request their electronic prescription be transferred to another pharmacy without having to go back to their practitioner to initiate the request. This revised regulation went into effect on Monday, September 26, 2021.     At a patient's request, a DEA-registered retail pharmacy can now transfer an electronic prescription for a controlled substance (schedules II-V) to another DEA-registered retail pharmacy. Prior to this change, patients would have to go through their practitioner to  cancel their prescription   and have it re-issued to a different pharmacy. The process was taxing and time consuming for both patients and practitioners.    The Drug Enforcement Administration (DEA) published its intent to revise the process for transferring electronic prescriptions on December 19, 2019.  The final rule was published in the federal register on August 25, 2021 and went into effect 30 days later.  Under the final rule, a prescription can only be transferred once between pharmacies, and only if allowed under existing state or other applicable law. The prescription must remain in its electronic form; may not be altered in any way; and the transfer must be communicated directly between two licensed pharmacists. It's important to note, any authorized refills transfer with the original prescription, which means the entire prescription will be filled at the same pharmacy."    REFERENCES: 1. DEA website announcement https://www.dea.gov/stories/2023/2023-09/2021-09-01/revised-regulation-allows-dea-registered-pharmacies-transfer  2. Department of Justice website  https://www.govinfo.gov/content/pkg/FR-2021-08-25/pdf/2023-15847.pdf  3. DEPARTMENT OF JUSTICE Drug Enforcement Administration 21 CFR Part 1306 [Docket No. [id number]] RIN [id number]-AB64 "Transfer of Electronic Prescriptions for Schedules II-V Controlled Substances Between Pharmacies for Initial Filling"  ____________________________________________________________________________________________     _______________________________________________________________________  Medication Rules  Purpose: To inform patients, and their family members, of our medication rules and regulations.  Applies to: All patients receiving prescriptions from our practice (written or electronic).  Pharmacy of record: This is the pharmacy where your electronic prescriptions will be sent. Make sure we have the correct one.  Electronic prescriptions: In  compliance with the [place] Strengthen Opioid Misuse Prevention (STOP) Act of 2017 (Session Law 2017-74/H243), effective January 30, 2018, all controlled substances must be electronically prescribed. Written prescriptions, faxing, or calling prescriptions to a pharmacy will no longer be done.  Prescription refills: These will be provided only during in-person appointments. No medications will be renewed without a "face-to-face" evaluation with your provider. Applies to all prescriptions.  NOTE: The following applies primarily to controlled substances (Opioid* Pain Medications).   Type of encounter (visit): For patients receiving controlled substances, face-to-face visits are required. (Not an option and not up to the patient.)  Patient's responsibilities: Pain Pills: Bring all pain pills to every appointment (except for procedure appointments). Pill Bottles: Bring pills in original pharmacy bottle. Bring bottle, even if empty. Always bring the bottle of the most recent fill.  Medication refills: You are responsible for knowing and keeping track of what medications you are taking and when is it that you will need a refill. The day before your appointment: write a list of all prescriptions that need to be refilled. The day of the appointment: give the list to the admitting nurse. Prescriptions will be written only during appointments. No prescriptions will be written on procedure days. If you forget a medication: it will not be "Called in", "Faxed", or "electronically sent". You will need to get another appointment to get these prescribed. No early refills. Do not call asking to have your prescription filled early. Partial  or short prescriptions: Occasionally your pharmacy may not have enough pills to fill your prescription.  NEVER ACCEPT a partial fill or a prescription that is short of the total amount of pills that you were prescribed.  With controlled substances the law allows 72 hours for  the pharmacy to complete the prescription.  If the prescription is not completed within 72 hours, the pharmacist will require a new prescription to be written. This means that you will be short on your medicine and we WILL NOT send another prescription to complete your original   prescription.  Instead, request the pharmacy to send a carrier to a nearby branch to get enough medication to provide you with your full prescription. Prescription Accuracy: You are responsible for carefully inspecting your prescriptions before leaving our office. Have the discharge nurse carefully go over each prescription with you, before taking them home. Make sure that your name is accurately spelled, that your address is correct. Check the name and dose of your medication to make sure it is accurate. Check the number of pills, and the written instructions to make sure they are clear and accurate. Make sure that you are given enough medication to last until your next medication refill appointment. Taking Medication: Take medication as prescribed. When it comes to controlled substances, taking less pills or less frequently than prescribed is permitted and encouraged. Never take more pills than instructed. Never take the medication more frequently than prescribed.  Inform other Doctors: Always inform, all of your healthcare providers, of all the medications you take. Pain Medication from other Providers: You are not allowed to accept any additional pain medication from any other Doctor or Healthcare provider. There are two exceptions to this rule. (see below) In the event that you require additional pain medication, you are responsible for notifying us, as stated below. Cough Medicine: Often these contain an opioid, such as codeine or hydrocodone. Never accept or take cough medicine containing these opioids if you are already taking an opioid* medication. The combination may cause respiratory failure and death. Medication Agreement:  You are responsible for carefully reading and following our Medication Agreement. This must be signed before receiving any prescriptions from our practice. Safely store a copy of your signed Agreement. Violations to the Agreement will result in no further prescriptions. (Additional copies of our Medication Agreement are available upon request.) Laws, Rules, & Regulations: All patients are expected to follow all Federal and State Laws, Statutes, Rules, & Regulations. Ignorance of the Laws does not constitute a valid excuse.  Illegal drugs and Controlled Substances: The use of illegal substances (including, but not limited to marijuana and its derivatives) and/or the illegal use of any controlled substances is strictly prohibited. Violation of this rule may result in the immediate and permanent discontinuation of any and all prescriptions being written by our practice. The use of any illegal substances is prohibited. Adopted CDC guidelines & recommendations: Target dosing levels will be at or below 60 MME/day. Use of benzodiazepines** is not recommended.  Exceptions: There are only two exceptions to the rule of not receiving pain medications from other Healthcare Providers. Exception #1 (Emergencies): In the event of an emergency (i.e.: accident requiring emergency care), you are allowed to receive additional pain medication. However, you are responsible for: As soon as you are able, call our office [phone], at any time of the day or night, and leave a message stating your name, the date and nature of the emergency, and the name and dose of the medication prescribed. In the event that your call is answered by a member of our staff, make sure to document and save the date, time, and the name of the person that took your information.  Exception #2 (Planned Surgery): In the event that you are scheduled by another doctor or dentist to have any type of surgery or procedure, you are allowed (for a period no  longer than 30 days), to receive additional pain medication, for the acute post-op pain. However, in this case, you are responsible for picking up a copy of   our "Post-op Pain Management for Surgeons" handout, and giving it to your surgeon or dentist. This document is available at our office, and does not require an appointment to obtain it. Simply go to our office during business hours (Monday-Thursday from 8:00 AM to 4:00 PM) (Friday 8:00 AM to 12:00 Noon) or if you have a scheduled appointment with us, prior to your surgery, and ask for it by name. In addition, you are responsible for: calling our office [phone], at any time of the day or night, and leaving a message stating your name, name of your surgeon, type of surgery, and date of procedure or surgery. Failure to comply with your responsibilities may result in termination of therapy involving the controlled substances. Medication Agreement Violation. Following the above rules, including your responsibilities will help you in avoiding a Medication Agreement Violation ("Breaking your Pain Medication Contract").  Consequences:  Not following the above rules may result in permanent discontinuation of medication prescription therapy.  *Opioid medications include: morphine, codeine, oxycodone, oxymorphone, hydrocodone, hydromorphone, meperidine, tramadol, tapentadol, buprenorphine, fentanyl, methadone. **Benzodiazepine medications include: diazepam (Valium), alprazolam (Xanax), clonazepam (Klonopine), lorazepam (Ativan), clorazepate (Tranxene), chlordiazepoxide (Librium), estazolam (Prosom), oxazepam (Serax), temazepam (Restoril), triazolam (Halcion) (Last updated: 11/22/2021) ______________________________________________________________________    ______________________________________________________________________  Medication Recommendations and Reminders  Applies to: All patients receiving prescriptions (written and/or  electronic).  Medication Rules & Regulations: You are responsible for reading, knowing, and following our "Medication Rules" document. These exist for your safety and that of others. They are not flexible and neither are we. Dismissing or ignoring them is an act of "non-compliance" that may result in complete and irreversible termination of such medication therapy. For safety reasons, "non-compliance" will not be tolerated. As with the [place] fundamental legal principle of "ignorance of the law is no defense", we will accept no excuses for not having read and knowing the content of documents provided to you by our practice.  Pharmacy of record:  Definition: This is the pharmacy where your electronic prescriptions will be sent.  We do not endorse any particular pharmacy. It is up to you and your insurance to decide what pharmacy to use.  We do not restrict you in your choice of pharmacy. However, once we write for your prescriptions, we will NOT be re-sending more prescriptions to fix restricted supply problems created by your pharmacy, or your insurance.  The pharmacy listed in the electronic medical record should be the one where you want electronic prescriptions to be sent. If you choose to change pharmacy, simply notify our nursing staff. Changes will be made only during your regular appointments and not over the phone.  Recommendations: Keep all of your pain medications in a safe place, under lock and key, even if you live alone. We will NOT replace lost, stolen, or damaged medication. We do not accept "Police Reports" as proof of medications having been stolen. After you fill your prescription, take 1 week's worth of pills and put them away in a safe place. You should keep a separate, properly labeled bottle for this purpose. The remainder should be kept in the original bottle. Use this as your primary supply, until it runs out. Once it's gone, then you know that you have 1 week's worth of medicine,  and it is time to come in for a prescription refill. If you do this correctly, it is unlikely that you will ever run out of medicine. To make sure that the above recommendation works, it is very important that you make   sure your medication refill appointments are scheduled at least 1 week before you run out of medicine. To do this in an effective manner, make sure that you do not leave the office without scheduling your next medication management appointment. Always ask the nursing staff to show you in your prescription , when your medication will be running out. Then arrange for the receptionist to get you a return appointment, at least 7 days before you run out of medicine. Do not wait until you have 1 or 2 pills left, to come in. This is very poor planning and does not take into consideration that we may need to cancel appointments due to bad weather, sickness, or emergencies affecting our staff. DO NOT ACCEPT A "Partial Fill": If for any reason your pharmacy does not have enough pills/tablets to completely fill or refill your prescription, do not allow for a "partial fill". The law allows the pharmacy to complete that prescription within 72 hours, without requiring a new prescription. If they do not fill the rest of your prescription within those 72 hours, you will need a separate prescription to fill the remaining amount, which we will NOT provide. If the reason for the partial fill is your insurance, you will need to talk to the pharmacist about payment alternatives for the remaining tablets, but again, DO NOT ACCEPT A PARTIAL FILL, unless you can trust your pharmacist to obtain the remainder of the pills within 72 hours.  Prescription refills and/or changes in medication(s):  Prescription refills, and/or changes in dose or medication, will be conducted only during scheduled medication management appointments. (Applies to both, written and electronic prescriptions.) No refills on procedure days. No  medication will be changed or started on procedure days. No changes, adjustments, and/or refills will be conducted on a procedure day. Doing so will interfere with the diagnostic portion of the procedure. No phone refills. No medications will be "called into the pharmacy". No Fax refills. No weekend refills. No Holliday refills. No after hours refills.  Remember:  Business hours are:  Monday to Thursday 8:00 AM to 4:00 PM Provider's Schedule: Climmie Cronce, MD - Appointments are:  Medication management: Monday and Wednesday 8:00 AM to 4:00 PM Procedure day: Tuesday and Thursday 7:30 AM to 4:00 PM Bilal Lateef, MD - Appointments are:  Medication management: Tuesday and Thursday 8:00 AM to 4:00 PM Procedure day: Monday and Wednesday 7:30 AM to 4:00 PM (Last update: 11/22/2021) ______________________________________________________________________    ____________________________________________________________________________________________  Drug Holidays  What is a "Drug Holiday"? Drug Holiday: is the name given to the process of slowly tapering down and temporarily stopping the pain medication for the purpose of decreasing or eliminating tolerance to the drug.  Benefits Improved effectiveness Decreased required effective dose Improved pain control End dependence on high dose therapy Decrease cost of therapy Uncovering "opioid-induced hyperalgesia". (OIH)  What is "opioid hyperalgesia"? It is a paradoxical increase in pain caused by exposure to opioids. Stopping the opioid pain medication, contrary to the expected, it actually decreases or completely eliminates the pain. Ref.: "A comprehensive review of opioid-induced hyperalgesia". Marion Lee, et.al. Pain Physician. 2011 Mar-Apr;14(2):145-61.  What is tolerance? Tolerance: the progressive loss of effectiveness of a pain medicine due to repetitive use. A common problem of opioid pain medications.  How long should a "Drug  Holiday" last? Effectiveness depends on the patient staying off all opioid pain medicines for a minimum of 14 consecutive days. (2 weeks)  How about just taking less of the medicine? Does not   work. Will not accomplish goal of eliminating the excess receptors.  How about switching to a different pain medicine? (AKA. "Opioid rotation") Does not work. Creates the illusion of effectiveness by taking advantage of inaccurate equivalent dose calculations between different opioids. -This "technique" was promoted by studies funded by pharmaceutical companies, such as [employer], creators of "OxyContin".  Can I stop the medicine "cold turkey"? Depends. You should always coordinate with your Pain Specialist to make the transition as smoothly as possible. Avoid stopping the medicine abruptly without consulting. We recommend a "slow taper".  What is a slow taper? Taper: refers to the gradual decrease in dose.   How do I stop/taper the dose? Slowly. Decrease the daily amount of pills that you take by one (1) pill every seven (7) days. This is called a "slow downward taper". Example: if you normally take four (4) pills per day, drop it to three (3) pills per day for seven (7) days, then to two (2) pills per day for seven (7) days, then to one (1) per day for seven (7) days, and then stop the medicine. The 14 day "Drug Holiday" starts on the first day without medicine.   Will I experience withdrawals? Unlikely with a slow taper.  What triggers withdrawals? Withdrawals are triggered by the sudden/abrupt stop of high dose opioids. Withdrawals can be avoided by slowly decreasing the dose over a prolonged period of time.  What are withdrawals? Symptoms associated with sudden/abrupt reduction/stopping of high-dose, long-term use of pain medication. Withdrawal are seldom seen on low dose therapy, or patients rarely taking opioid medication.  Early Withdrawal Symptoms may include: Agitation Anxiety Muscle  aches Increased tearing Insomnia Runny nose Sweating Yawning  Late symptoms may include: Abdominal cramping Diarrhea Dilated pupils Goose bumps Nausea Vomiting  (Last update: 01/08/2022) ____________________________________________________________________________________________    ____________________________________________________________________________________________  WARNING: CBD (cannabidiol) & Delta (Delta-8 tetrahydrocannabinol) products.   Applicable to:  All individuals currently taking or considering taking CBD (cannabidiol) and, more important, all patients taking opioid analgesic controlled substances (pain medication). (Example: oxycodone; oxymorphone; hydrocodone; hydromorphone; morphine; methadone; tramadol; tapentadol; fentanyl; buprenorphine; butorphanol; dextromethorphan; meperidine; codeine; etc.)  Introduction:  Recently there has been a drive towards the use of "natural" products for the treatment of different conditions, including pain anxiety and sleep disorders. Marijuana and hemp are two varieties of the cannabis genus plants. Marijuana and its derivatives are illegal, while hemp and its derivatives are not. Cannabidiol (CBD) and tetrahydrocannabinol (THC), are two natural compounds found in plants of the Cannabis genus. They can both be extracted from hemp or marijuana. Both compounds interact with your body's endocannabinoid system in very different ways. CBD is associated with pain relief (analgesia) while THC is associated with the psychoactive effects ("the high") obtained from the use of marijuana products. There are two main types of THC: Delta-9, which comes from the marijuana plant and it is illegal, and Delta-8, which comes from the hemp plant, and it is legal. (Both, Delta-9-THC and Delta-8-THC are psychoactive and give you "the high".)   Legality:  Marijuana and its derivatives: illegal Hemp and its derivatives: Legal (State dependent) UPDATE:  (03/18/2021) The Drug Enforcement Agency (DEA) issued a letter stating that "delta" cannabinoids, including Delta-8-THCO and Delta-9-THCO, synthetically derived from hemp do not qualify as hemp and will be viewed as Schedule I drugs. (Schedule I drugs, substances, or chemicals are defined as drugs with no currently accepted medical use and a high potential for abuse. Some examples of Schedule I drugs are: heroin,   lysergic acid diethylamide (LSD), marijuana (cannabis), 3,4-methylenedioxymethamphetamine (ecstasy), methaqualone, and peyote.) (https://www.dea.gov)  Legal status of CBD in [place]:  "Conditionally Legal"  Reference: "FDA Regulation of Cannabis and Cannabis-Derived Products, Including Cannabidiol (CBD)" - https://www.fda.gov/news-events/public-health-focus/fda-regulation-cannabis-and-cannabis-derived-products-including-cannabidiol-cbd  Warning:  CBD is not FDA approved and has not undergo the same manufacturing controls as prescription drugs.  This means that the purity and safety of available CBD may be questionable. Most of the time, despite manufacturer's claims, it is contaminated with THC (delta-9-tetrahydrocannabinol - the chemical in marijuana responsible for the "HIGH").  When this is the case, the THC contaminant will trigger a positive urine drug screen (UDS) test for Marijuana (carboxy-THC).   The FDA recently put out a warning about 5 things that everyone should be aware of regarding Delta-8 THC: Delta-8 THC products have not been evaluated or approved by the FDA for safe use and may be marketed in ways that put the public health at risk. The FDA has received adverse event reports involving delta-8 THC-containing products. Delta-8 THC has psychoactive and intoxicating effects. Delta-8 THC manufacturing often involve use of potentially harmful chemicals to create the concentrations of delta-8 THC claimed in the marketplace. The final delta-8 THC product may have potentially harmful  by-products (contaminants) due to the chemicals used in the process. Manufacturing of delta-8 THC products may occur in uncontrolled or unsanitary settings, which may lead to the presence of unsafe contaminants or other potentially harmful substances. Delta-8 THC products should be kept out of the reach of children and pets.  NOTE: Because a positive UDS for any illicit substance is a violation of our medication agreement, your opioid analgesics (pain medicine) may be permanently discontinued.  MORE ABOUT CBD  General Information: CBD was discovered in 1940 and it is a derivative of the cannabis sativa genus plants (Marijuana and Hemp). It is one of the 113 identified substances found in Marijuana. It accounts for up to 40% of the plant's extract. As of 2018, preliminary clinical studies on CBD included research for the treatment of anxiety, movement disorders, and pain. CBD is available and consumed in multiple forms, including inhalation of smoke or vapor, as an aerosol spray, and by mouth. It may be supplied as an oil containing CBD, capsules, dried cannabis, or as a liquid solution. CBD is thought not to be as psychoactive as THC (delta-9-tetrahydrocannabinol - the chemical in marijuana responsible for the "HIGH"). Studies suggest that CBD may interact with different biological target receptors in the body, including cannabinoid and other neurotransmitter receptors. As of 2018 the mechanism of action for its biological effects has not been determined.  Side-effects  Adverse reactions: Dry mouth, diarrhea, decreased appetite, fatigue, drowsiness, malaise, weakness, sleep disturbances, and others.  Drug interactions:  CBD may interact with medications such as blood-thinners. CBD causes drowsiness on its own and it will increase drowsiness caused by other medications, including antihistamines (such as Benadryl), benzodiazepines (Xanax, Ativan, Valium), antipsychotics, antidepressants, opioids, alcohol  and supplements such as kava, melatonin and St. John's Wort.  Other drug interactions: Brivaracetam (Briviact); Caffeine; Carbamazepine (Tegretol); Citalopram (Celexa); Clobazam (Onfi); Eslicarbazepine (Aptiom); Everolimus (Zostress); Lithium; Methadone (Dolophine); Rufinamide (Banzel); Sedative medications (CNS depressants); Sirolimus (Rapamune); Stiripentol (Diacomit); Tacrolimus (Prograf); Tamoxifen ; Soltamox); Topiramate (Topamax); Valproate; Warfarin (Coumadin); Zonisamide. (Last update: 01/09/2022) ____________________________________________________________________________________________   ____________________________________________________________________________________________  Naloxone Nasal Spray  Why am I receiving this medication?  STOP ACT requires that all patients taking high dose opioids or at risk of opioids respiratory depression, be prescribed an opioid reversal agent, such as   Naloxone (AKA: Narcan).  What is this medication? NALOXONE (nal OX one) treats opioid overdose, which causes slow or shallow breathing, severe drowsiness, or trouble staying awake. Call emergency services after using this medication. You may need additional treatment. Naloxone works by reversing the effects of opioids. It belongs to a group of medications called opioid blockers.  COMMON BRAND NAME(S): Kloxxado, Narcan  What should I tell my care team before I take this medication? They need to know if you have any of these conditions: Heart disease Substance use disorder An unusual or allergic reaction to naloxone, other medications, foods, dyes, or preservatives Pregnant or trying to get pregnant Breast-feeding  When to use this medication? This medication is to be used for the treatment of respiratory depression (less than 8 breaths per minute) secondary to opioid overdose.   How to use this medication? This medication is for use in the nose. Lay the person on their back.  Support their neck with your hand and allow the head to tilt back before giving the medication. The nasal spray should be given into 1 nostril. After giving the medication, move the person onto their side. Do not remove or test the nasal spray until ready to use. Get emergency medical help right away after giving the first dose of this medication, even if the person wakes up. You should be familiar with how to recognize the signs and symptoms of a narcotic overdose. If more doses are needed, give the additional dose in the other nostril. Talk to your care team about the use of this medication in children. While this medication may be prescribed for children as young as newborns for selected conditions, precautions do apply.  Naloxone Overdosage: If you think you have taken too much of this medicine contact a poison control center or emergency room at once.  NOTE: This medicine is only for you. Do not share this medicine with others.  What if I miss a dose? This does not apply.  What may interact with this medication? This is only used during an emergency. No interactions are expected during emergency use. This list may not describe all possible interactions. Give your health care provider a list of all the medicines, herbs, non-prescription drugs, or dietary supplements you use. Also tell them if you smoke, drink alcohol, or use illegal drugs. Some items may interact with your medicine.  What should I watch for while using this medication? Keep this medication ready for use in the case of an opioid overdose. Make sure that you have the phone number of your care team and local hospital ready. You may need to have additional doses of this medication. Each nasal spray contains a single dose. Some emergencies may require additional doses. After use, bring the treated person to the nearest hospital or call 911. Make sure the treating care team knows that the person has received a dose of this medication.  You will receive additional instructions on what to do during and after use of this medication before an emergency occurs.  What side effects may I notice from receiving this medication? Side effects that you should report to your care team as soon as possible: Allergic reactions--skin rash, itching, hives, swelling of the face, lips, tongue, or throat Side effects that usually do not require medical attention (report these to your care team if they continue or are bothersome): Constipation Dryness or irritation inside the nose Headache Increase in blood pressure Muscle spasms Stuffy nose Toothache This list may not   describe all possible side effects. Call your doctor for medical advice about side effects. You may report side effects to FDA at 1-800-FDA-1088.  Where should I keep my medication? Because this is an emergency medication, you should keep it with you at all times.  Keep out of the reach of children and pets. Store between 20 and 25 degrees C (68 and 77 degrees F). Do not freeze. Throw away any unused medication after the expiration date. Keep in original box until ready to use.  NOTE: This sheet is a summary. It may not cover all possible information. If you have questions about this medicine, talk to your doctor, pharmacist, or health care provider.   2023 Elsevier/Gold Standard (2020-09-24 00:00:00)  ____________________________________________________________________________________________   

## 2022-04-17 NOTE — Progress Notes (Unsigned)
PROVIDER NOTE: Information contained herein reflects review and annotations entered in association with encounter. Interpretation of such information and data should be left to medically-trained personnel. Information provided to patient can be located elsewhere in the medical record under "Patient Instructions". Document created using STT-dictation technology, any transcriptional errors that may result from process are unintentional.    Patient: Tracy Mann  Service Category: E/M  Provider: Gaspar Cola, MD  DOB: 01/24/1934  DOS: 04/19/2022  Referring Provider: Emelia Loron  MRN: [id number]  Specialty: Interventional Pain Management  PCP: [name], PA-C  Type: Established Patient  Setting: Ambulatory outpatient    Location: Office  Delivery: Face-to-face     HPI  [name], a 89 y.o. year old female, is here today because of her No primary diagnosis found.. [name]'s primary complain today is No chief complaint on file.  Pertinent problems: [name] has Cervical muscle strain; DDD (degenerative disc disease), lumbosacral; Chronic venous insufficiency; Chronic pain syndrome; Abnormal MRI, lumbar spine (08/22/2019); Lumbar facet hypertrophy (Multilevel) (Bilateral); Lumbar central spinal stenosis (SEVERE) (L3-4) with neurogenic claudication; Grade 1 Anterolisthesis of lumbar spine (L4/L5); Lumbar facet arthropathy (L3-4 right-sided facet edema/joint effusion); Lumbar lateral recess stenosis (L4-5) (Right); Chronic low back pain (3ry area of Pain) (Bilateral) w/ sciatica (Bilateral); Lumbosacral radiculopathy at L5 (Bilateral); Chronic lower extremity pain (1ry area of Pain) (Bilateral); Lumbosacral facet syndrome; Chronic hip pain (2ry area of Pain) (Bilateral) (R>L); Osteoarthritis involving multiple joints; Osteoarthritis of knee (Right); Osteoarthritis of facet joint of lumbar spine; Osteoarthritis of lumbar spine; Bilateral lower extremity edema; Cellulitis of right  lower leg; Chronic knee pain (Bilateral); Peripheral vascular disease ([place]) (lower extremity) (Bilateral); Cervicalgia; Chronic upper back pain; and Lumbosacral spondylosis with radiculopathy on their pertinent problem list. Pain Assessment: Severity of   is reported as a  /10. Location:    / . Onset:  . Quality:  . Timing:  . Modifying factor(s):  Marland Kitchen Vitals:  vitals were not taken for this visit.  BMI: Estimated body mass index is 34.48 kg/m as calculated from the following:   Height as of 03/20/22: 5' 5.5" (1.664 m).   Weight as of 03/20/22: 210 lb 6 oz (95.4 kg). Last encounter: 01/16/2022. Last procedure: 08/04/2021.  Reason for encounter: medication management. ***  RTCB: 07/25/2022   Pharmacotherapy Assessment  Analgesic: Hydrocodone/APAP 10/325, (see the 05/09/2021 note) using 1.675 pills/day. MME/day: 10-20 mg/day   Monitoring: [place] PMP: PDMP reviewed during this encounter.       Pharmacotherapy: No side-effects or adverse reactions reported. Compliance: No problems identified. Effectiveness: Clinically acceptable.  No notes on file  No results found for: "CBDTHCR" No results found for: "D8THCCBX" No results found for: "D9THCCBX"  UDS:  Summary  Date Value Ref Range Status  05/09/2021 Note  Final    Comment:    ==================================================================== [employer] [facility] (MW) ==================================================================== Test                             Result       Flag       Units  Drug Present and Declared for Prescription Verification   Lorazepam                      1824         EXPECTED   ng/mg creat    Source of lorazepam is a scheduled prescription medication.    Hydrocodone  2020         EXPECTED   ng/mg creat   Hydromorphone                  234          EXPECTED   ng/mg creat   Dihydrocodeine                 495          EXPECTED   ng/mg creat   Norhydrocodone                 2173          EXPECTED   ng/mg creat    Sources of hydrocodone include scheduled prescription medications.    Hydromorphone, dihydrocodeine and norhydrocodone are expected    metabolites of hydrocodone. Hydromorphone and dihydrocodeine are    also available as scheduled prescription medications.  ==================================================================== Test                      Result    Flag   Units      Ref Range   Creatinine              80               mg/dL      >=20 ==================================================================== Declared Medications:  The flagging and interpretation on this report are based on the  following declared medications.  Unexpected results may arise from  inaccuracies in the declared medications.   **Note: The testing scope of this panel includes these medications:   Hydrocodone (Norco)  Lorazepam (Ativan)   **Note: The testing scope of this panel does not include the  following reported medications:   Acetaminophen (Norco)  Albuterol (Ventolin HFA)  Estradiol (Estrace)  Ezetimibe (Zetia)  Flavoxate (Urispas)  Fluticasone (Flonase)  Insulin (Lantus)  Levothyroxine (Synthroid)  Lisinopril (Zestril)  Magnesium  Metolazone  Metoprolol (Toprol)  Nitroglycerin (Nitrostat)  Potassium (Klor-Con)  Ropinirole (Requip)  Torsemide (Demadex)  Undefined Miscellaneous Drug  Warfarin (Coumadin) ==================================================================== For clinical consultation, please call [phone]. ====================================================================       ROS  Constitutional: Denies any fever or chills Gastrointestinal: No reported hemesis, hematochezia, vomiting, or acute GI distress Musculoskeletal: Denies any acute onset joint swelling, redness, loss of ROM, or weakness Neurological: No reported episodes of acute onset apraxia, aphasia, dysarthria, agnosia, amnesia, paralysis, loss of coordination, or loss  of consciousness  Medication Review  FreeStyle Libre 3 Reader, FreeStyle Libre 3 Sensor, HYDROcodone-acetaminophen, Insulin Pen Needle, LORazepam, Magnesium, NONFORMULARY OR COMPOUNDED ITEM, albuterol, apixaban, ezetimibe, flavoxATE, fluticasone, insulin glargine, insulin lispro, levothyroxine, lisinopril, metolazone, naloxone, nitroGLYCERIN, ondansetron, polyethylene glycol powder, potassium chloride SA, rOPINIRole, and torsemide  History Review  Allergy: [name] is allergic to duloxetine, paroxetine, sulfa antibiotics, cephalexin, clarithromycin, diphenhydramine, estrogens conjugated, and nitrofurantoin. Drug: [name]  reports no history of drug use. Alcohol:  reports no history of alcohol use. Tobacco:  reports that she has never smoked. She has never used smokeless tobacco. Social: [name]  reports that she has never smoked. She has never used smokeless tobacco. She reports that she does not drink alcohol and does not use drugs. Medical:  has a past medical history of Acute myocardial infarction of inferior wall ([place]), Arthritis, Chronic anticoagulation, Chronic atrial fibrillation ([place]), Chronic combined systolic and diastolic CHF (congestive heart failure) ([place]), Essential hypertension, Hyperlipidemia, mixed, Mixed Ischemic/Nonischemic Cardiomyopathy, Nonobstructive coronary artery disease, Obesity, Thyroid disease, Type II diabetes mellitus ([place]),  Venous insufficiency, and Vitamin D deficiency. Surgical: [name]  has a past surgical history that includes Vesicovaginal fistula closure w/ TAH (1996); Tubal ligation (1968); Cholecystectomy (1999); and Cardiac catheterization (11/2012). Family: family history includes Heart disease in her mother; Thyroid disease in her father.  Laboratory Chemistry Profile   Renal Lab Results  Component Value Date   BUN 31 (H) 03/15/2022   CREATININE 1.32 (H) 03/15/2022   BCR 23 03/15/2022   GFRAA 73 12/31/2019   GFRNONAA 36 (L) 09/19/2021     Hepatic Lab Results  Component Value Date   AST 20 03/15/2022   ALT 10 03/15/2022   ALBUMIN 4.1 03/15/2022   ALKPHOS 114 03/15/2022    Electrolytes Lab Results  Component Value Date   NA 141 03/15/2022   K 4.7 03/15/2022   CL 101 03/15/2022   CALCIUM 9.2 03/15/2022   MG 2.1 11/26/2019   PHOS 3.9 09/08/2014    Bone Lab Results  Component Value Date   25OHVITD1 43 11/26/2019   25OHVITD2 9.2 11/26/2019   25OHVITD3 34 11/26/2019    Inflammation (CRP: Acute Phase) (ESR: Chronic Phase) Lab Results  Component Value Date   CRP 4 11/26/2019   ESRSEDRATE 45 (H) 11/26/2019   LATICACIDVEN 1.4 01/09/2020         Note: Above Lab results reviewed.  Recent Imaging Review  DG Abd 1 View CLINICAL DATA:  Chronic constipation, abdominal distention.  EXAM: ABDOMEN - 1 VIEW  COMPARISON:  May 05, 2019.  FINDINGS: Status post cholecystectomy. No abnormal bowel dilatation is noted. Mild amount of stool seen throughout the colon.  IMPRESSION: No abnormal bowel dilatation.  Electronically Signed   By: Marijo Conception M.D.   On: 01/11/2022 12:54 Note: Reviewed        Physical Exam  General appearance: Well nourished, well developed, and well hydrated. In no apparent acute distress Mental status: Alert, oriented x 3 (person, place, & time)       Respiratory: No evidence of acute respiratory distress Eyes: PERLA Vitals: There were no vitals taken for this visit. BMI: Estimated body mass index is 34.48 kg/m as calculated from the following:   Height as of 03/20/22: 5' 5.5" (1.664 m).   Weight as of 03/20/22: 210 lb 6 oz (95.4 kg). Ideal: Patient weight not recorded  Assessment   Diagnosis Status  1. Chronic pain syndrome   2. Pharmacologic therapy   3. Lumbosacral facet syndrome   4. Chronic upper back pain   5. Chronic knee pain (Bilateral)   6. Encounter for medication management   7. Encounter for chronic pain management   8. Chronic use of opiate for therapeutic  purpose   9. Chronic lower extremity pain (1ry area of Pain) (Bilateral)   10. Chronic low back pain (3ry area of Pain) (Bilateral) w/ sciatica (Bilateral)   11. Chronic hip pain (2ry area of Pain) (Bilateral) (R>L)    Controlled Controlled Controlled   Updated Problems: No problems updated.  Plan of Care  Problem-specific:  No problem-specific Assessment & Plan notes found for this encounter.  [name] has a current medication list which includes the following long-term medication(s): albuterol, apixaban, freestyle libre 3 reader, freestyle libre 3 sensor, ezetimibe, fluticasone, hydrocodone-acetaminophen, hydrocodone-acetaminophen, insulin lispro, levothyroxine, lisinopril, metolazone, nitroglycerin, potassium chloride sa, ropinirole, and torsemide.  Pharmacotherapy (Medications Ordered): No orders of the defined types were placed in this encounter.  Orders:  No orders of the defined types were placed in this encounter.  Follow-up plan:   No follow-ups on file.      Interventional Therapies  Risk  Complexity Considerations:   NOTE: COUMADIN Anticoagulation (Stop: 5 days  Restart: 2 hours) Decreased GFR Obstructive sleep apnea   Planned  Pending:      Under consideration:   Diagnostic midline caudal ESI #1 + diagnostic epidurogram  Diagnostic bilateral IA hip joint injection #1  Diagnostic right L3-4 LESI #1  Diagnostic right L4 TFESI #1  Diagnostic bilateral L3 TFESI #1  Diagnostic bilateral lumbar facet MBB #1    Completed:   Diagnostic/therapeutic midline L4-5 LESI x2 (08/04/2021) (100/100/100/100)    Completed by Dr. Alba Destine [facility]):   Diagnostic bilateral L4 TFESI x1 (09/12/2019 - Dr. Alba Destine [[facility]])    Therapeutic  Palliative (PRN) options:   Diagnostic/therapeutic midline L4-5 LESI        Recent Visits No visits were found meeting these conditions. Showing recent visits within past 90 days and meeting all other requirements Future  Appointments Date Type Provider Dept  04/19/22 Appointment Milinda Pointer, MD [facility]  Showing future appointments within next 90 days and meeting all other requirements  I discussed the assessment and treatment plan with the patient. The patient was provided an opportunity to ask questions and all were answered. The patient agreed with the plan and demonstrated an understanding of the instructions.  Patient advised to call back or seek an in-person evaluation if the symptoms or condition worsens.  Duration of encounter: *** minutes.  Total time on encounter, as per AMA guidelines included both the face-to-face and non-face-to-face time personally spent by the physician and/or other qualified health care professional(s) on the day of the encounter (includes time in activities that require the physician or other qualified health care professional and does not include time in activities normally performed by clinical staff). Physician's time may include the following activities when performed: Preparing to see the patient (e.g., pre-charting review of records, searching for previously ordered imaging, lab work, and nerve conduction tests) Review of prior analgesic pharmacotherapies. Reviewing PMP Interpreting ordered tests (e.g., lab work, imaging, nerve conduction tests) Performing post-procedure evaluations, including interpretation of diagnostic procedures Obtaining and/or reviewing separately obtained history Performing a medically appropriate examination and/or evaluation Counseling and educating the patient/family/caregiver Ordering medications, tests, or procedures Referring and communicating with other health care professionals (when not separately reported) Documenting clinical information in the electronic or other health record Independently interpreting results (not separately reported) and communicating results to the patient/ family/caregiver Care coordination  (not separately reported)  Note by: Tracy Cola, MD Date: 04/19/2022; Time: 7:50 AM

## 2022-04-19 ENCOUNTER — Encounter: Payer: Self-pay | Admitting: Pain Medicine

## 2022-04-19 ENCOUNTER — Ambulatory Visit: Payer: Medicare Other | Attending: Pain Medicine | Admitting: Pain Medicine

## 2022-04-19 VITALS — BP 135/60 | HR 61 | Temp 97.2°F | Resp 16 | Ht 65.0 in | Wt 208.0 lb

## 2022-04-19 DIAGNOSIS — M25562 Pain in left knee: Secondary | ICD-10-CM | POA: Insufficient documentation

## 2022-04-19 DIAGNOSIS — G8929 Other chronic pain: Secondary | ICD-10-CM | POA: Diagnosis present

## 2022-04-19 DIAGNOSIS — G894 Chronic pain syndrome: Secondary | ICD-10-CM | POA: Diagnosis present

## 2022-04-19 DIAGNOSIS — M25551 Pain in right hip: Secondary | ICD-10-CM | POA: Diagnosis present

## 2022-04-19 DIAGNOSIS — M5442 Lumbago with sciatica, left side: Secondary | ICD-10-CM | POA: Insufficient documentation

## 2022-04-19 DIAGNOSIS — M47817 Spondylosis without myelopathy or radiculopathy, lumbosacral region: Secondary | ICD-10-CM

## 2022-04-19 DIAGNOSIS — M79605 Pain in left leg: Secondary | ICD-10-CM | POA: Diagnosis present

## 2022-04-19 DIAGNOSIS — Z79899 Other long term (current) drug therapy: Secondary | ICD-10-CM

## 2022-04-19 DIAGNOSIS — M25552 Pain in left hip: Secondary | ICD-10-CM | POA: Insufficient documentation

## 2022-04-19 DIAGNOSIS — M79604 Pain in right leg: Secondary | ICD-10-CM | POA: Insufficient documentation

## 2022-04-19 DIAGNOSIS — M549 Dorsalgia, unspecified: Secondary | ICD-10-CM | POA: Insufficient documentation

## 2022-04-19 DIAGNOSIS — M5441 Lumbago with sciatica, right side: Secondary | ICD-10-CM | POA: Diagnosis present

## 2022-04-19 DIAGNOSIS — Z79891 Long term (current) use of opiate analgesic: Secondary | ICD-10-CM

## 2022-04-19 DIAGNOSIS — M25561 Pain in right knee: Secondary | ICD-10-CM | POA: Diagnosis present

## 2022-04-19 MED ORDER — HYDROCODONE-ACETAMINOPHEN 10-325 MG PO TABS: 1.0000 | ORAL_TABLET | Freq: Three times a day (TID) | ORAL | 0 refills | Status: DC | PRN

## 2022-04-19 NOTE — Progress Notes (Signed)
Nursing Pain Medication Assessment:  Safety precautions to be maintained throughout the outpatient stay will include: orient to surroundings, keep bed in low position, maintain call bell within reach at all times, provide assistance with transfer out of bed and ambulation.  Medication Inspection Compliance: Pill count conducted under aseptic conditions, in front of the patient. Neither the pills nor the bottle was removed from the patient's sight at any time. Once count was completed pills were immediately returned to the patient in their original bottle.  Medication: Hydrocodone/APAP Pill/Patch Count:  14 of 60 pills remain Pill/Patch Appearance: Markings consistent with prescribed medication Bottle Appearance: Standard pharmacy container. Clearly labeled. Filled Date: 02 / 26 / 2024 Last Medication intake:  Today

## 2022-04-25 DIAGNOSIS — R809 Proteinuria, unspecified: Secondary | ICD-10-CM | POA: Insufficient documentation

## 2022-04-25 DIAGNOSIS — N281 Cyst of kidney, acquired: Secondary | ICD-10-CM | POA: Insufficient documentation

## 2022-04-25 DIAGNOSIS — E1122 Type 2 diabetes mellitus with diabetic chronic kidney disease: Secondary | ICD-10-CM | POA: Insufficient documentation

## 2022-04-27 ENCOUNTER — Other Ambulatory Visit: Payer: Self-pay | Admitting: Nephrology

## 2022-04-27 DIAGNOSIS — R8290 Unspecified abnormal findings in urine: Secondary | ICD-10-CM

## 2022-04-27 DIAGNOSIS — R809 Proteinuria, unspecified: Secondary | ICD-10-CM

## 2022-04-27 DIAGNOSIS — E1122 Type 2 diabetes mellitus with diabetic chronic kidney disease: Secondary | ICD-10-CM

## 2022-05-01 ENCOUNTER — Ambulatory Visit: Admit: 2022-05-01 | Payer: Medicare Other | Admitting: Ophthalmology

## 2022-05-01 SURGERY — PHACOEMULSIFICATION, CATARACT, WITH IOL INSERTION
Anesthesia: Topical | Laterality: Right

## 2022-05-09 ENCOUNTER — Ambulatory Visit
Admission: RE | Admit: 2022-05-09 | Discharge: 2022-05-09 | Disposition: A | Discharge status: Home / Self Care | Payer: Medicare Other | Source: Ambulatory Visit | Attending: Nephrology | Admitting: Nephrology

## 2022-05-09 DIAGNOSIS — R809 Proteinuria, unspecified: Secondary | ICD-10-CM | POA: Insufficient documentation

## 2022-05-09 DIAGNOSIS — E1122 Type 2 diabetes mellitus with diabetic chronic kidney disease: Secondary | ICD-10-CM

## 2022-05-09 DIAGNOSIS — R8290 Unspecified abnormal findings in urine: Secondary | ICD-10-CM | POA: Insufficient documentation

## 2022-05-15 ENCOUNTER — Ambulatory Visit: Admit: 2022-05-15 | Payer: Medicare Other | Admitting: Ophthalmology

## 2022-05-15 SURGERY — PHACOEMULSIFICATION, CATARACT, WITH IOL INSERTION
Anesthesia: Topical | Laterality: Left

## 2022-06-11 IMAGING — CR DG LUMBAR SPINE COMPLETE W/ BEND
1 series · 7 of 7 positions shown · non-contrast
Comparison: MRI of the lumbar spine on 08/20/2019

CLINICAL DATA: LOWER back pain. Increasing LOWER back pain.
Symptoms for years, worse recently.

EXAM:
LUMBAR SPINE - COMPLETE WITH BENDING VIEWS

[Series 1: dg lumbar spine complete w/bend 6+v · 0.14mm/px · 7 of 7 slices shown]
[im 1/7]
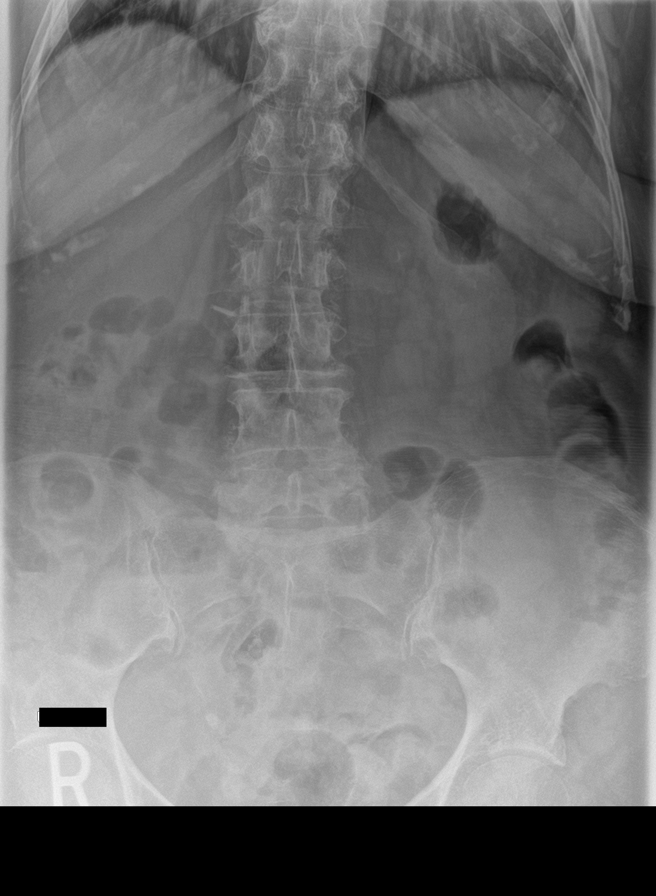
[im 2/7]
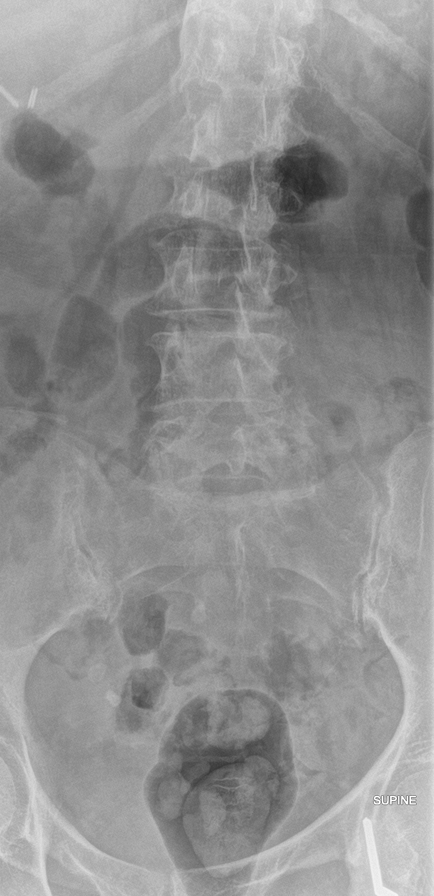
[im 3/7]
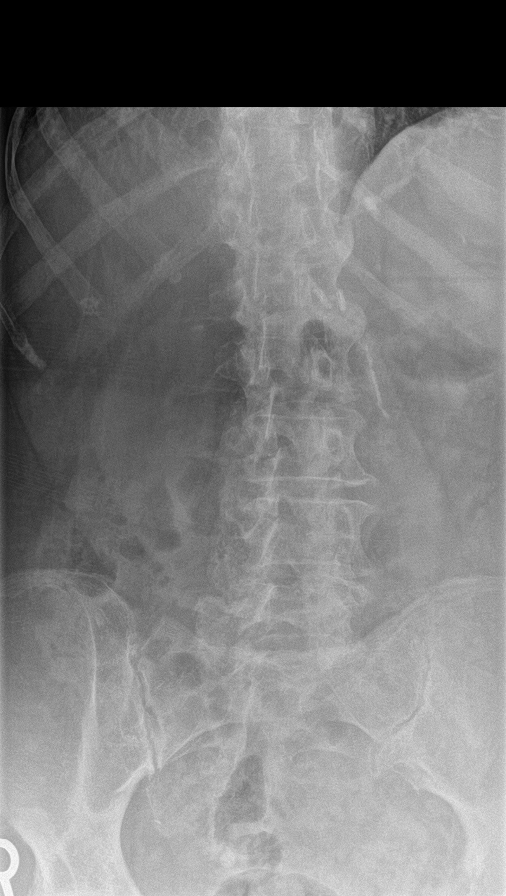
[im 4/7]
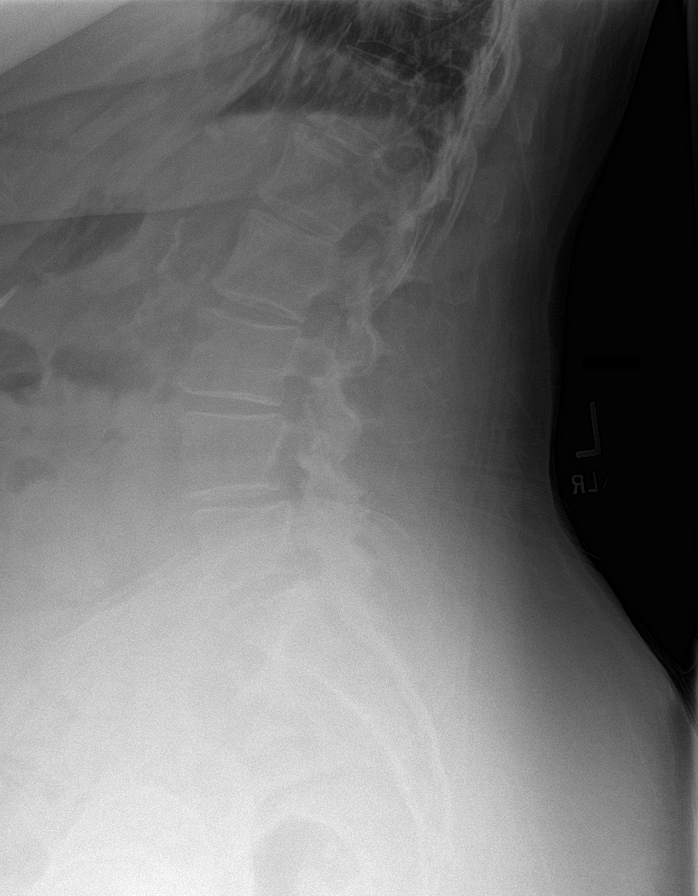
[im 5/7]
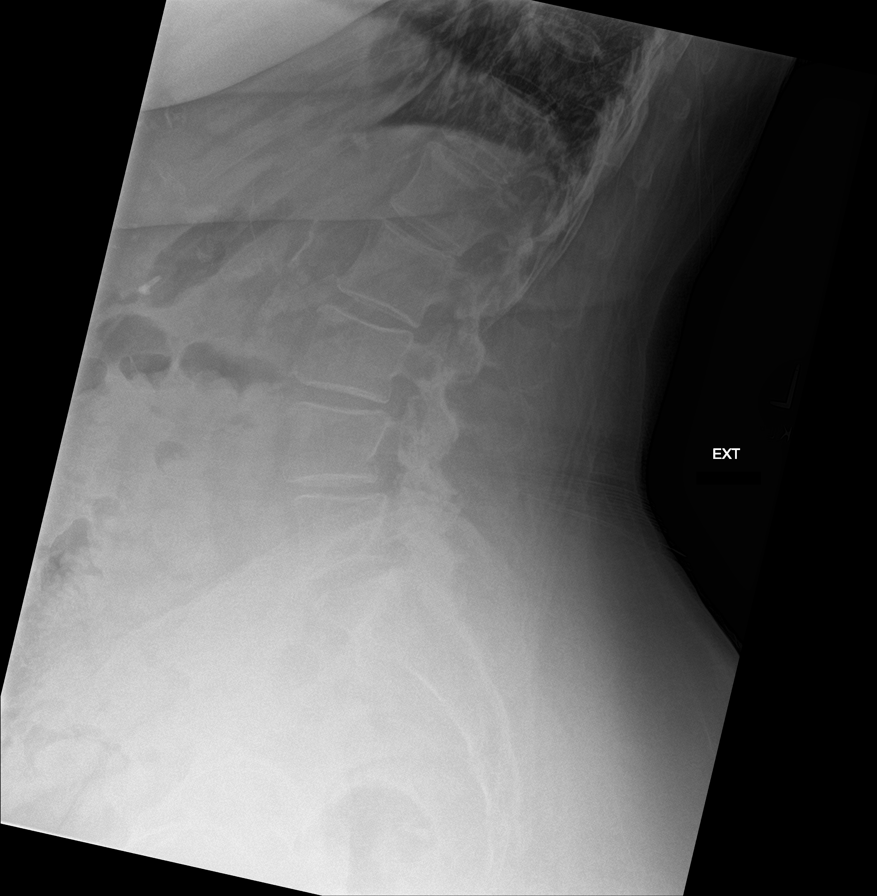
[im 6/7]
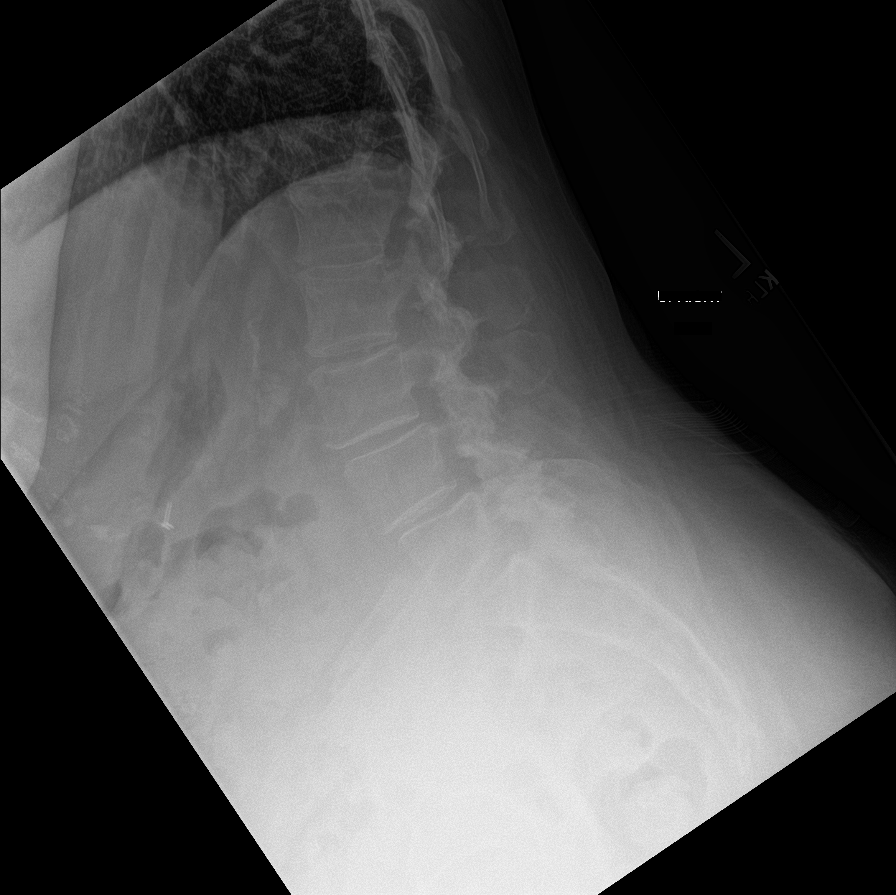
[im 7/7]
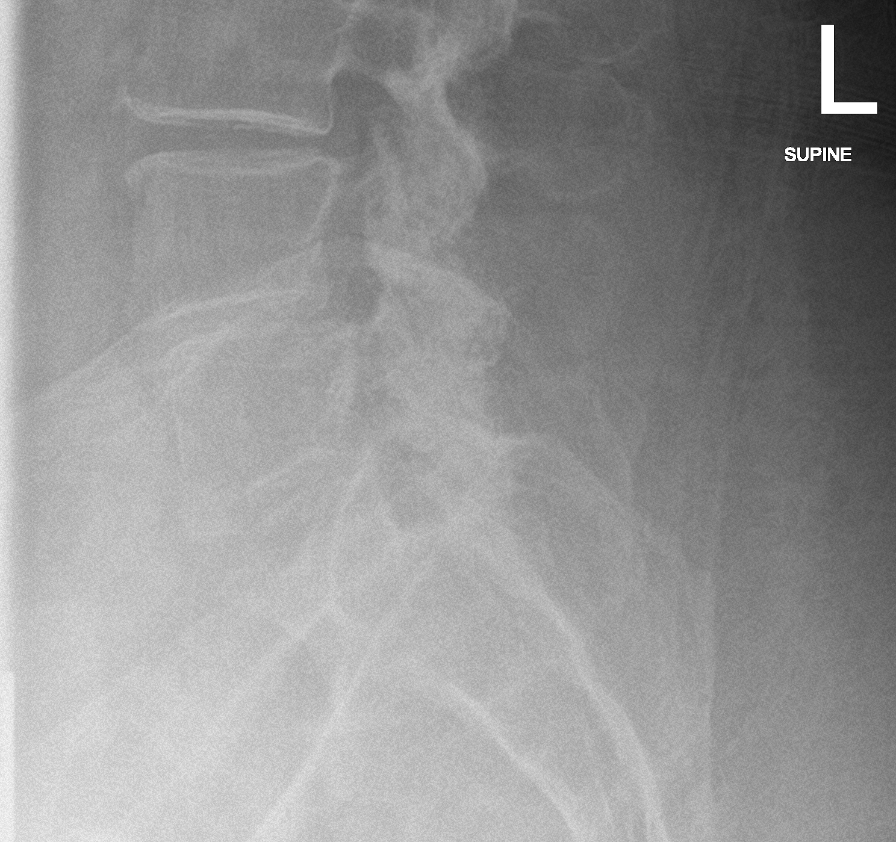

[7 of 7 positions shown; findings below may reference images not displayed]

FINDINGS: There is 4 millimeters of anterolisthesis of L4 on L5, stable during
flexion and extension. There is no acute fracture or subluxation.
Significant facet hypertrophy noted in the LOWER lumbar levels.
There is disc height loss at L4-5. No lytic or blastic lesions.
Visualized bowel gas pattern is nonobstructed.
IMPRESSION: 1. Stable anterolisthesis of L4 on L5 during flexion and extension.
2. Significant facet hypertrophy in the LOWER lumbar levels.

## 2022-06-18 NOTE — Progress Notes (Unsigned)
Cardiology Office Note  Date:  06/20/2022   ID:  Tracy Mann, Tracy Mann 20-Jul-1933, MRN [id number]  PCP:  Tracy Clos, MD   Chief Complaint  Patient presents with   3 month follow up     Patient c/o extreme tiredness & weakness, some mild bilateral LE edema & shortness of breath with walking a long distance. Medications reviewed by the patient verbally.     HPI:  Tracy Mann is a 89 year old woman with a PMH significant for  chronic atrial fibrillation on eliquis Cardiomyopathy Medication intolerance diastolic and systolic dysfunction, EF 40 to 45% on echo 01/2018  hypertension,  hyperlipidemia,  morbid obesity,   moderate pulmonary hypertension inferior wall myocardial infarction felt to be secondary to a thrombus from atrial fib in October 2007 at [facility],  episodes of chest pain  Ejection fraction 30 to 35%, moderately elevated right heart pressures, July 16, 2020 following COVID EF 30 to 35% on echo 05/18/21 who presents for followup Of her coronary artery disease and leg edema., persistent atrial fibrillation, cardiomyopathy  Last seen by myself February 2024 On that visit reported feeling well, no fluid retention  On today's visit reports having fatigue, weakness, mild leg edema, shortness of breath on walking Rare cramps in her legs, better with mustard  Weight stable 209 pounds compliant with torsemide 20-40 daily,  metolazone 2.5 mg two times a week  Lab work reviewed A1c 6.5 Creatinine 1.3 BUN 28 TSH 5.4 Hemoglobin 12  Followed by nephrology Off potassium  Trying to lose weight, low carb diet Walks with walker  Has a tall walker as well  EKG personally reviewed by myself on todays visit Atrial fibrillation rate 72 bpm interventricular conduction delay, left axis deviation  Prior medication intolerances Stopped metoprolol, had dizzy spell/near syncope intolerances to [place], carvedilol unable to tolerate [place], makes her  sneeze, cough 30 minutes after taking it with a runny nose and eye watering  Prior imaging reviewed Echocardiogram April 19 with ejection fraction 30 to 35% Moderate to severely elevated right heart pressures Severe biatrial enlargement moderate to severe MR No dramatic changes compared to study dated June 22  Previous attempt to use Carvedilol 3.25 twice daily started, Entresto 24/26 unsuccessful secondary to intolerance Went back on lisinopril, stopped the coreg  COVID infection early 2022  Does not use her lymphedema compression pumps  HBA1C 9.1 on labs 12/2018 HBA1C 8.3 on 05/20/2019  PMH:   has a past medical history of Acute myocardial infarction of inferior wall (HCC), Arthritis, Chronic anticoagulation, Chronic atrial fibrillation (HCC), Chronic combined systolic and diastolic CHF (congestive heart failure) (HCC), Essential hypertension, Hyperlipidemia, mixed, Mixed Ischemic/Nonischemic Cardiomyopathy, Nonobstructive coronary artery disease, Obesity, Thyroid disease, Type II diabetes mellitus (HCC), Venous insufficiency, and Vitamin D deficiency.  PSH:    Past Surgical History:  Procedure Laterality Date   CARDIAC CATHETERIZATION  11/2012   [facility];EF 45-50%   CHOLECYSTECTOMY  1999   TUBAL LIGATION  1968   VESICOVAGINAL FISTULA CLOSURE W/ TAH  1996    Current Outpatient Medications  Medication Sig Dispense Refill   albuterol (VENTOLIN HFA) 108 (90 Base) MCG/ACT inhaler TAKE 2 PUFFS BY MOUTH EVERY 6 HOURS AS NEEDED FOR WHEEZE OR SHORTNESS OF BREATH 8.5 each 2   apixaban (ELIQUIS) 5 MG TABS tablet Take 1 tablet (5 mg total) by mouth 2 (two) times daily. 180 tablet 3   ezetimibe (ZETIA) 10 MG tablet Take 1 tablet (10 mg total) by mouth daily. 90 tablet 2  flavoxATE (URISPAS) 100 MG tablet Take 1 tablet (100 mg total) by mouth 2 (two) times daily as needed for bladder spasms. 60 tablet 11   fluticasone (FLONASE) 50 MCG/ACT nasal spray Place 2 sprays into both nostrils daily. 48  mL 3   HYDROcodone-acetaminophen (NORCO) 10-325 MG tablet Take 1 tablet by mouth every 8 (eight) hours as needed for severe pain. Must last 30 days. 60 tablet 0   insulin glargine (LANTUS SOLOSTAR) 100 UNIT/ML Solostar Pen Inject 24 Units into the skin at bedtime.     insulin lispro (HUMALOG) 100 UNIT/ML KwikPen Inject 10-25 Units into the skin in the morning, at noon, and at bedtime.     Insulin Pen Needle (RELION PEN NEEDLE 31G/8MM) 31G X 8 MM MISC Use with insulin pen three times daily 100 each 12   levothyroxine (SYNTHROID) 25 MCG tablet Take 25 mcg by mouth daily before breakfast.     lisinopril (ZESTRIL) 10 MG tablet TAKE 1 TABLET BY MOUTH EVERY DAY 90 tablet 3   LORazepam (ATIVAN) 1 MG tablet TAKE 1 TABLET(1 MG) BY MOUTH TWICE DAILY AS NEEDED 60 tablet 5   Magnesium 500 MG CAPS Take 1,000 mg by mouth daily.      metolazone (ZAROXOLYN) 2.5 MG tablet Take 1 tablet (2.5 mg total) by mouth as directed. Take two times a week with torsemide 40 mg 90 tablet 1   naloxone (NARCAN) nasal spray 4 mg/0.1 mL Place 1 spray into the nose as needed for up to 365 doses (for opioid-induced respiratory depresssion). In case of emergency (overdose), spray once into each nostril. If no response within 3 minutes, repeat application and call 911. 1 each 0   nitrofurantoin (MACRODANTIN) 50 MG capsule Take 50 mg by mouth at bedtime.     nitroGLYCERIN (NITROSTAT) 0.4 MG SL tablet Place 1 tablet (0.4 mg total) under the tongue every 5 (five) minutes as needed for chest pain. 25 tablet 1   NONFORMULARY OR COMPOUNDED ITEM See pharmacy note- Not to use on any open skin or wound. 120 each 3   ondansetron (ZOFRAN) 4 MG tablet Take 1 tablet (4 mg total) by mouth every 12 (twelve) hours as needed for nausea or vomiting. 60 tablet 2   pantoprazole (PROTONIX) 40 MG tablet Take 40 mg by mouth as needed.     phenazopyridine (PYRIDIUM) 95 MG tablet Take by mouth 3 (three) times daily as needed.     polyethylene glycol powder  (GLYCOLAX/MIRALAX) 17 GM/SCOOP powder Take 255 g by mouth daily. 238 g 0   rOPINIRole (REQUIP) 0.25 MG tablet TAKE 1 TABLET BY MOUTH EVERYDAY AT BEDTIME 90 tablet 1   torsemide (DEMADEX) 20 MG tablet Take 2 tablets (40 mg total) by mouth daily. 60 tablet 6   Continuous Blood Gluc Receiver (FREESTYLE LIBRE 3 READER) DEVI 1 Device by Does not apply route daily. Use to check glucose continuously (Patient not taking: Reported on 06/20/2022) 1 each 0   Continuous Blood Gluc Sensor (FREESTYLE LIBRE 3 SENSOR) MISC Place 1 sensor on the skin every 14 days. Use to check glucose continuously (Patient not taking: Reported on 06/20/2022) 2 each 12   HYDROcodone-acetaminophen (NORCO) 10-325 MG tablet Take 1 tablet by mouth every 8 (eight) hours as needed for severe pain. Must last 30 days. (Patient not taking: Reported on 06/20/2022) 60 tablet 0   HYDROcodone-acetaminophen (NORCO) 10-325 MG tablet Take 1 tablet by mouth every 8 (eight) hours as needed for severe pain. Must last 30 days. (Patient not  taking: Reported on 06/20/2022) 60 tablet 0   [START ON 06/25/2022] HYDROcodone-acetaminophen (NORCO) 10-325 MG tablet Take 1 tablet by mouth every 8 (eight) hours as needed for severe pain. Must last 30 days. (Patient not taking: Reported on 06/20/2022) 60 tablet 0   No current facility-administered medications for this visit.    Allergies:   Duloxetine, Paroxetine, Sulfa antibiotics, Cephalexin, Clarithromycin, Diphenhydramine, Estrogens conjugated, Estrogens conjugated, and Nitrofurantoin   Social History:  The patient  reports that she has never smoked. She has never used smokeless tobacco. She reports that she does not drink alcohol and does not use drugs.   Family History:   family history includes Heart disease in her mother; Thyroid disease in her father.    Review of Systems: Review of Systems  Constitutional:  Positive for malaise/fatigue.  HENT: Negative.    Respiratory: Negative.    Cardiovascular:  Negative.   Gastrointestinal: Negative.   Musculoskeletal: Negative.        Leg weakness  Neurological: Negative.   Psychiatric/Behavioral: Negative.    All other systems reviewed and are negative.   PHYSICAL EXAM: VS:  BP (!) 110/50 (BP Location: Left Arm, Patient Position: Sitting, Cuff Size: Large)   Pulse 72   Ht 5' 5.5" (1.664 m)   Wt 209 lb 6 oz (95 kg)   SpO2 93%   BMI 34.31 kg/m  , BMI Body mass index is 34.31 kg/m. Constitutional:  oriented to person, place, and time. No distress.  HENT:  Head: Grossly normal Eyes:  no discharge. No scleral icterus.  Neck: No JVD, no carotid bruits  Cardiovascular: irreg irreg,  no murmurs appreciated Chronic leg edema trace Pulmonary/Chest: Clear to auscultation bilaterally, no wheezes or rails Abdominal: Soft.  no distension.  no tenderness.  Musculoskeletal: Normal range of motion Neurological:  normal muscle tone. Coordination normal. No atrophy Skin: Skin warm and dry Psychiatric: normal affect, pleasant  Recent Labs: 06/21/2021: NT-Pro BNP 1,723 09/19/2021: B Natriuretic Peptide 83.4 03/15/2022: ALT 10; BUN 31; Creatinine, Ser 1.32; Hemoglobin 12.2; Platelets 206; Potassium 4.7; Sodium 141    Lipid Panel Wt Readings from Last 3 Encounters:  06/20/22 209 lb 6 oz (95 kg)  04/19/22 208 lb (94.3 kg)  03/20/22 210 lb 6 oz (95.4 kg)     ASSESSMENT AND PLAN:  Acute on chronic diastolic and systolic CHF Weight stable 209 pounds Takes torsemide 20-40 every day, rarely skips a day Metolazone 2.5 milligrams two  days a week Moderates fluid intake Trace woody leg edema No significant PND orthopnea  Cardiomyopathy Last evaluation April 2023 ejection fraction 30 to 35% Previous attempts at goal-directed medical therapy unsuccessful secondary to medication intolerances did not tolerate Farxiga, has not tolerated Entresto, spironolactone, Coreg Weight stable on current regiment, continue lisinopril  Mixed   hyperlipidemia Continue Zetia Statin intolerance, no changes made  Essential hypertension  Lisinopril  10 daily, diuretic as above She previously stopped metoprolol for near-syncope Medication intolerances as detailed above  Chronic atrial fibrillation (HCC)  A. fib  contributing to CHF Off warfarin, on eliquis   Obstructive sleep apnea Previously reported this was "not a major issue" Currently not on CPAP  Type 2 diabetes mellitus without complication, with long-term current use of insulin (HCC) Recommend low carbohydrate diet, walking program Followed by endocrine, tried Ozempic/Mounjaro, had stomach cramps   Total encounter time more than 30 minutes  Greater than 50% was spent in counseling and coordination of care with the patient   Orders Placed This Encounter  Procedures   EKG 12-Lead     Signed, Dossie Arbour, M.D., Ph.D. 06/20/2022  [facility] [place], [place] [phone]

## 2022-06-20 ENCOUNTER — Encounter: Payer: Self-pay | Admitting: Cardiovascular Disease

## 2022-06-20 ENCOUNTER — Ambulatory Visit: Payer: Medicare Other | Attending: Cardiovascular Disease | Admitting: Cardiovascular Disease

## 2022-06-20 VITALS — BP 110/50 | HR 72 | Ht 65.5 in | Wt 209.4 lb

## 2022-06-20 DIAGNOSIS — I5042 Chronic combined systolic (congestive) and diastolic (congestive) heart failure: Secondary | ICD-10-CM | POA: Diagnosis not present

## 2022-06-20 DIAGNOSIS — I25118 Atherosclerotic heart disease of native coronary artery with other forms of angina pectoris: Secondary | ICD-10-CM

## 2022-06-20 DIAGNOSIS — R0789 Other chest pain: Secondary | ICD-10-CM

## 2022-06-20 DIAGNOSIS — I340 Nonrheumatic mitral (valve) insufficiency: Secondary | ICD-10-CM

## 2022-06-20 DIAGNOSIS — I482 Chronic atrial fibrillation, unspecified: Secondary | ICD-10-CM

## 2022-06-20 DIAGNOSIS — Z789 Other specified health status: Secondary | ICD-10-CM

## 2022-06-20 DIAGNOSIS — G4733 Obstructive sleep apnea (adult) (pediatric): Secondary | ICD-10-CM

## 2022-06-20 DIAGNOSIS — R0609 Other forms of dyspnea: Secondary | ICD-10-CM

## 2022-06-20 DIAGNOSIS — R55 Syncope and collapse: Secondary | ICD-10-CM | POA: Diagnosis not present

## 2022-06-20 DIAGNOSIS — I739 Peripheral vascular disease, unspecified: Secondary | ICD-10-CM

## 2022-06-20 DIAGNOSIS — E782 Mixed hyperlipidemia: Secondary | ICD-10-CM

## 2022-06-20 MED ORDER — LISINOPRIL 10 MG PO TABS: 10.0000 mg | ORAL_TABLET | Freq: Every day | ORAL | 3 refills | Status: DC

## 2022-06-20 MED ORDER — METOLAZONE 2.5 MG PO TABS: 2.5000 mg | ORAL_TABLET | ORAL | 3 refills | Status: DC

## 2022-06-20 MED ORDER — TORSEMIDE 20 MG PO TABS: 40.0000 mg | ORAL_TABLET | Freq: Every day | ORAL | 3 refills | Status: DC

## 2022-06-20 NOTE — Patient Instructions (Addendum)
Check out instacart for grocery delivery   Medication Instructions:  No changes  If you need a refill on your cardiac medications before your next appointment, please call your pharmacy.   Lab work: No new labs needed  Testing/Procedures: No new testing needed  Follow-Up: At [facility], you and your health needs are our priority.  As part of our continuing mission to provide you with exceptional heart care, we have created designated Provider Care Teams.  These Care Teams include your primary Cardiologist (physician) and Advanced Practice Providers (APPs -  Physician Assistants and Nurse Practitioners) who all work together to provide you with the care you need, when you need it.  You will need a follow up appointment in 6 months  Providers on your designated Care Team:   Nicolasa Ducking, NP Eula Listen, PA-C Cadence Fransico [place], [place]  COVID-19 Vaccine Information can be found at: [email or web address] For questions related to vaccine distribution or appointments, please email [email or web address] or call [phone].

## 2022-07-18 NOTE — Patient Instructions (Incomplete)
____________________________________________________________________________________________  Opioid Pain Medication Update  To: All patients taking opioid pain medications. (I.e.: hydrocodone, hydromorphone, oxycodone, oxymorphone, morphine, codeine, methadone, tapentadol, tramadol, buprenorphine, fentanyl, etc.)  Re: Updated review of side effects and adverse reactions of opioid analgesics, as well as new information about long term effects of this class of medications.  Direct risks of long-term opioid therapy are not limited to opioid addiction and overdose. Potential medical risks include serious fractures, breathing problems during sleep, hyperalgesia, immunosuppression, chronic constipation, bowel obstruction, myocardial infarction, and tooth decay secondary to xerostomia.  Unpredictable adverse effects that can occur even if you take your medication correctly: Cognitive impairment, respiratory depression, and death. Most people think that if they take their medication "correctly", and "as instructed", that they will be safe. Nothing could be farther from the truth. In reality, a significant amount of recorded deaths associated with the use of opioids has occurred in individuals that had taken the medication for a long time, and were taking their medication correctly. The following are examples of how this can happen: Patient taking his/her medication for a long time, as instructed, without any side effects, is given a certain antibiotic or another unrelated medication, which in turn triggers a "Drug-to-drug interaction" leading to disorientation, cognitive impairment, impaired reflexes, respiratory depression or an untoward event leading to serious bodily harm or injury, including death.  Patient taking his/her medication for a long time, as instructed, without any side effects, develops an acute impairment of liver and/or kidney function. This will lead to a rapid inability of the body to  breakdown and eliminate their pain medication, which will result in effects similar to an "overdose", but with the same medicine and dose that they had always taken. This again may lead to disorientation, cognitive impairment, impaired reflexes, respiratory depression or an untoward event leading to serious bodily harm or injury, including death.  A similar problem will occur with patients as they grow older and their liver and kidney function begins to decrease as part of the aging process.  Background information: Historically, the original case for using long-term opioid therapy to treat chronic noncancer pain was based on safety assumptions that subsequent experience has called into question. In 1996, the American Pain Society and the American Academy of Pain Medicine issued a consensus statement supporting long-term opioid therapy. This statement acknowledged the dangers of opioid prescribing but concluded that the risk for addiction was low; respiratory depression induced by opioids was short-lived, occurred mainly in opioid-naive patients, and was antagonized by pain; tolerance was not a common problem; and efforts to control diversion should not constrain opioid prescribing. This has now proven to be wrong. Experience regarding the risks for opioid addiction, misuse, and overdose in community practice has failed to support these assumptions.  According to the Centers for Disease Control and Prevention, fatal overdoses involving opioid analgesics have increased sharply over the past decade. Currently, more than 96,700 people die from drug overdoses every year. Opioids are a factor in 7 out of every 10 overdose deaths. Deaths from drug overdose have surpassed motor vehicle accidents as the leading cause of death for individuals between the ages of 35 and 54.  Clinical data suggest that neuroendocrine dysfunction may be very common in both men and women, potentially causing hypogonadism, erectile  dysfunction, infertility, decreased libido, osteoporosis, and depression. Recent studies linked higher opioid dose to increased opioid-related mortality. Controlled observational studies reported that long-term opioid therapy may be associated with increased risk for cardiovascular events. Subsequent meta-analysis concluded   that the safety of long-term opioid therapy in elderly patients has not been proven.   Side Effects and adverse reactions: Common side effects: Drowsiness (sedation). Dizziness. Nausea and vomiting. Constipation. Physical dependence -- Dependence often manifests with withdrawal symptoms when opioids are discontinued or decreased. Tolerance -- As you take repeated doses of opioids, you require increased medication to experience the same effect of pain relief. Respiratory depression -- This can occur in healthy people, especially with higher doses. However, people with COPD, asthma or other lung conditions may be even more susceptible to fatal respiratory impairment.  Uncommon side effects: An increased sensitivity to feeling pain and extreme response to pain (hyperalgesia). Chronic use of opioids can lead to this. Delayed gastric emptying (the process by which the contents of your stomach are moved into your small intestine). Muscle rigidity. Immune system and hormonal dysfunction. Quick, involuntary muscle jerks (myoclonus). Arrhythmia. Itchy skin (pruritus). Dry mouth (xerostomia).  Long-term side effects: Chronic constipation. Sleep-disordered breathing (SDB). Increased risk of bone fractures. Hypothalamic-pituitary-adrenal dysregulation. Increased risk of overdose.  RISKS: Fractures and Falls:  Opioids increase the risk and incidence of falls. This is of particular importance in elderly patients.  Endocrine System:  Long-term administration is associated with endocrine abnormalities (endocrinopathies). (Also known as Opioid-induced Endocrinopathy) Influences  on both the hypothalamic-pituitary-adrenal axis?and the hypothalamic-pituitary-gonadal axis have been demonstrated with consequent hypogonadism and adrenal insufficiency in both sexes. Hypogonadism and decreased levels of dehydroepiandrosterone sulfate have been reported in men and women. Endocrine effects include: Amenorrhoea in women (abnormal absence of menstruation) Reduced libido in both sexes Decreased sexual function Erectile dysfunction in men Hypogonadisms (decreased testicular function with shrinkage of testicles) Infertility Depression and fatigue Loss of muscle mass Anxiety Depression Immune suppression Hyperalgesia Weight gain Anemia Osteoporosis Patients (particularly women of childbearing age) should avoid opioids. There is insufficient evidence to recommend routine monitoring of asymptomatic patients taking opioids in the long-term for hormonal deficiencies.  Immune System: Human studies have demonstrated that opioids have an immunomodulating effect. These effects are mediated via opioid receptors both on immune effector cells and in the central nervous system. Opioids have been demonstrated to have adverse effects on antimicrobial response and anti-tumour surveillance. Buprenorphine has been demonstrated to have no impact on immune function.  Opioid Induced Hyperalgesia: Human studies have demonstrated that prolonged use of opioids can lead to a state of abnormal pain sensitivity, sometimes called opioid induced hyperalgesia (OIH). Opioid induced hyperalgesia is not usually seen in the absence of tolerance to opioid analgesia. Clinically, hyperalgesia may be diagnosed if the patient on long-term opioid therapy presents with increased pain. This might be qualitatively and anatomically distinct from pain related to disease progression or to breakthrough pain resulting from development of opioid tolerance. Pain associated with hyperalgesia tends to be more diffuse than the  pre-existing pain and less defined in quality. Management of opioid induced hyperalgesia requires opioid dose reduction.  Cancer: Chronic opioid therapy has been associated with an increased risk of cancer among noncancer patients with chronic pain. This association was more evident in chronic strong opioid users. Chronic opioid consumption causes significant pathological changes in the small intestine and colon. Epidemiological studies have found that there is a link between opium dependence and initiation of gastrointestinal cancers. Cancer is the second leading cause of death after cardiovascular disease. Chronic use of opioids can cause multiple conditions such as GERD, immunosuppression and renal damage as well as carcinogenic effects, which are associated with the incidence of cancers.   Mortality: Long-term opioid use   has been associated with increased mortality among patients with chronic non-cancer pain (CNCP).  Prescription of long-acting opioids for chronic noncancer pain was associated with a significantly increased risk of all-cause mortality, including deaths from causes other than overdose.  Reference: Von Korff M, Kolodny A, Deyo RA, Chou R. Long-term opioid therapy reconsidered. Ann Intern Med. 2011 Sep 6;155(5):325-8. doi: 10.7326/[id number]. PMID: 16109604; PMCID: [id number]. Randon Goldsmith, Hayward RA, Dunn KM, [place] KP. Risk of adverse events in patients prescribed long-term opioids: A cohort study in the [place] [facility] Practice Research Datalink. Eur J Pain. 2019 May;23(5):908-922. doi: 10.1002/ejp.1357. Epub 2019 Jan 31. PMID: 78295621. Colameco S, Coren JS, Ciervo CA. Continuous opioid treatment for chronic noncancer pain: a time for moderation in prescribing. Postgrad Med. 2009 Jul;121(4):61-6. doi: 10.3810/pgm.2009.07.2032. PMID: 30865784. William Hamburger RN, Durand SD, Blazina I, Cristopher Peru, Bougatsos C, Deyo RA. The  effectiveness and risks of long-term opioid therapy for chronic pain: a systematic review for a [employer] of [employer] Pathways to [employer]. Ann Intern Med. 2015 Feb 17;162(4):276-86. doi: 10.7326/M14-2559. PMID: 69629528. Caryl Bis [facility], [place] DM. NCHS Data Brief No. 22. [place]: Centers for Disease Control and Prevention; 2009. Sep, Increase in Fatal Poisonings Involving Opioid Analgesics in the [place], 1999-2006. Song IA, Choi HR, Oh TK. Long-term opioid use and mortality in patients with chronic non-cancer pain: Ten-year follow-up study in [place] from 2010 through 2019. EClinicalMedicine. 2022 Jul 18;51:101558. doi: 10.1016/j.eclinm.2022.413244. PMID: 01027253; PMCID: [id number]. Huser, W., Schubert, T., Vogelmann, T. et al. All-cause mortality in patients with long-term opioid therapy compared with non-opioid analgesics for chronic non-cancer pain: a database study. BMC Med 18, 162 (2020). http://lester.info/ Rashidian H, Karie Kirks, Malekzadeh R, Haghdoost AA. An Ecological Study of the Association between Opiate Use and Incidence of Cancers. Addict Health. 2016 Fall;8(4):252-260. PMID: 59563875; PMCID: [id number].  Our Goal: Our goal is to control your pain with means other than the use of opioid pain medications.  Our Recommendation: Talk to your physician about coming off of these medications. We can assist you with the tapering down and stopping these medicines. Based on the new information, even if you cannot completely stop the medication, a decrease in the dose may be associated with a lesser risk. Ask for other means of controlling the pain. Decrease or eliminate those factors that significantly contribute to your pain such as smoking, obesity, and a diet heavily tilted towards "inflammatory" nutrients.  Last Updated: 03/29/2022    ____________________________________________________________________________________________     ____________________________________________________________________________________________  Transfer of Pain Medication between Pharmacies  Re: 2023 DEA Clarification on existing regulation  Published on DEA Website: September 30, 2021  Title: Revised Regulation Allows DEA-Registered Pharmacies to Electrical engineer Prescriptions at a Patient's Request DEA Headquarters Division - [employer]  "Patients now have the ability to request their electronic prescription be transferred to another pharmacy without having to go back to their practitioner to initiate the request. This revised regulation went into effect on Monday, September 26, 2021.     At a patient's request, a DEA-registered retail pharmacy can now transfer an electronic prescription for a controlled substance (schedules II-V) to another DEA-registered retail pharmacy. Prior to this change, patients would have to go through their practitioner to cancel their prescription and have it re-issued to a different pharmacy. The process was taxing and time consuming for both patients and practitioners.    The Drug Enforcement Administration [facility]) published its intent to revise the  process for transferring electronic prescriptions on December 19, 2019.  The final rule was published in the federal register on August 25, 2021 and went into effect 30 days later.  Under the final rule, a prescription can only be transferred once between pharmacies, and only if allowed under existing state or other applicable law. The prescription must remain in its electronic form; may not be altered in any way; and the transfer must be communicated directly between two licensed pharmacists. It's important to note, any authorized refills transfer with the original prescription, which means the entire prescription will be filled at the same pharmacy."     REFERENCES: 1. DEA website announcement HugeHand.is  2. Department of Justice website  CheapWipes.at.pdf  3. DEPARTMENT OF JUSTICE Drug Enforcement Administration 21 CFR Part 1306 [Docket No. [id number]-637] RIN [id number]-AB64 "Transfer of Electronic Prescriptions for Schedules II-V Controlled Substances Between Pharmacies for Initial Filling"  ____________________________________________________________________________________________     _______________________________________________________________________  Medication Rules  Purpose: To inform patients, and their family members, of our medication rules and regulations.  Applies to: All patients receiving prescriptions from our practice (written or electronic).  Pharmacy of record: This is the pharmacy where your electronic prescriptions will be sent. Make sure we have the correct one.  Electronic prescriptions: In compliance with the [facility] Strengthen Opioid Misuse Prevention (STOP) Act of 2017 (Session Conni Elliot [phone]), effective January 30, 2018, all controlled substances must be electronically prescribed. Written prescriptions, faxing, or calling prescriptions to a pharmacy will no longer be done.  Prescription refills: These will be provided only during in-person appointments. No medications will be renewed without a "face-to-face" evaluation with your provider. Applies to all prescriptions.  NOTE: The following applies primarily to controlled substances (Opioid* Pain Medications).   Type of encounter (visit): For patients receiving controlled substances, face-to-face visits are required. (Not an option and not up to the patient.)  Patient's responsibilities: Pain Pills: Bring all pain pills to every appointment (except for procedure appointments). Pill Bottles: Bring  pills in original pharmacy bottle. Bring bottle, even if empty. Always bring the bottle of the most recent fill.  Medication refills: You are responsible for knowing and keeping track of what medications you are taking and when is it that you will need a refill. The day before your appointment: write a list of all prescriptions that need to be refilled. The day of the appointment: give the list to the admitting nurse. Prescriptions will be written only during appointments. No prescriptions will be written on procedure days. If you forget a medication: it will not be "Called in", "Faxed", or "electronically sent". You will need to get another appointment to get these prescribed. No early refills. Do not call asking to have your prescription filled early. Partial  or short prescriptions: Occasionally your pharmacy may not have enough pills to fill your prescription.  NEVER ACCEPT a partial fill or a prescription that is short of the total amount of pills that you were prescribed.  With controlled substances the law allows 72 hours for the pharmacy to complete the prescription.  If the prescription is not completed within 72 hours, the pharmacist will require a new prescription to be written. This means that you will be short on your medicine and we WILL NOT send another prescription to complete your original prescription.  Instead, request the pharmacy to send a carrier to a nearby branch to get enough medication to provide you with your full prescription. Prescription Accuracy: You are responsible for carefully inspecting your  prescriptions before leaving our office. Have the discharge nurse carefully go over each prescription with you, before taking them home. Make sure that your name is accurately spelled, that your address is correct. Check the name and dose of your medication to make sure it is accurate. Check the number of pills, and the written instructions to make sure they are clear and accurate. Make  sure that you are given enough medication to last until your next medication refill appointment. Taking Medication: Take medication as prescribed. When it comes to controlled substances, taking less pills or less frequently than prescribed is permitted and encouraged. Never take more pills than instructed. Never take the medication more frequently than prescribed.  Inform other Doctors: Always inform, all of your healthcare providers, of all the medications you take. Pain Medication from other Providers: You are not allowed to accept any additional pain medication from any other Doctor or Healthcare provider. There are two exceptions to this rule. (see below) In the event that you require additional pain medication, you are responsible for notifying us, as stated below. Cough Medicine: Often these contain an opioid, such as codeine or hydrocodone. Never accept or take cough medicine containing these opioids if you are already taking an opioid* medication. The combination may cause respiratory failure and death. Medication Agreement: You are responsible for carefully reading and following our Medication Agreement. This must be signed before receiving any prescriptions from our practice. Safely store a copy of your signed Agreement. Violations to the Agreement will result in no further prescriptions. (Additional copies of our Medication Agreement are available upon request.) Laws, Rules, & Regulations: All patients are expected to follow all [address] and [employer], Rules, [place] [employer]. Ignorance of the Laws does not constitute a valid excuse.  Illegal drugs and Controlled Substances: The use of illegal substances (including, but not limited to marijuana and its derivatives) and/or the illegal use of any controlled substances is strictly prohibited. Violation of this rule may result in the immediate and permanent discontinuation of any and all prescriptions being written by our practice. The use of  any illegal substances is prohibited. Adopted CDC guidelines & recommendations: Target dosing levels will be at or below 60 MME/day. Use of benzodiazepines** is not recommended.  Exceptions: There are only two exceptions to the rule of not receiving pain medications from other Healthcare Providers. Exception #1 (Emergencies): In the event of an emergency (i.e.: accident requiring emergency care), you are allowed to receive additional pain medication. However, you are responsible for: As soon as you are able, call our office [phone], at any time of the day or night, and leave a message stating your name, the date and nature of the emergency, and the name and dose of the medication prescribed. In the event that your call is answered by a member of our staff, make sure to document and save the date, time, and the name of the person that took your information.  Exception #2 (Planned Surgery): In the event that you are scheduled by another doctor or dentist to have any type of surgery or procedure, you are allowed (for a period no longer than 30 days), to receive additional pain medication, for the acute post-op pain. However, in this case, you are responsible for picking up a copy of our "Post-op Pain Management for Surgeons" handout, and giving it to your surgeon or dentist. This document is available at our office, and does not require an appointment to obtain it. Simply go to  our office during business hours (Monday-Thursday from 8:00 AM to 4:00 PM) (Friday 8:00 AM to 12:00 Noon) or if you have a scheduled appointment with [place], prior to your surgery, and ask for it by name. In addition, you are responsible for: calling our office [phone], at any time of the day or night, and leaving a message stating your name, name of your surgeon, type of surgery, and date of procedure or surgery. Failure to comply with your responsibilities may result in termination of therapy involving the controlled  substances. Medication Agreement Violation. Following the above rules, including your responsibilities will help you in avoiding a Medication Agreement Violation ("Breaking your Pain Medication Contract").  Consequences:  Not following the above rules may result in permanent discontinuation of medication prescription therapy.  *Opioid medications include: morphine, codeine, oxycodone, oxymorphone, hydrocodone, hydromorphone, meperidine, tramadol, tapentadol, buprenorphine, fentanyl, methadone. **Benzodiazepine medications include: diazepam (Valium), alprazolam (Xanax), clonazepam (Klonopine), lorazepam (Ativan), clorazepate (Tranxene), chlordiazepoxide (Librium), estazolam (Prosom), oxazepam (Serax), temazepam (Restoril), triazolam (Halcion) (Last updated: 11/22/2021) ______________________________________________________________________    ______________________________________________________________________  Medication Recommendations and Reminders  Applies to: All patients receiving prescriptions (written and/or electronic).  Medication Rules & Regulations: You are responsible for reading, knowing, and following our "Medication Rules" document. These exist for your safety and that of others. They are not flexible and neither are we. Dismissing or ignoring them is an act of "non-compliance" that may result in complete and irreversible termination of such medication therapy. For safety reasons, "non-compliance" will not be tolerated. As with the [place] fundamental legal principle of "ignorance of the law is no defense", we will accept no excuses for not having read and knowing the content of documents provided to you by our practice.  Pharmacy of record:  Definition: This is the pharmacy where your electronic prescriptions will be sent.  We do not endorse any particular pharmacy. It is up to you and your insurance to decide what pharmacy to use.  We do not restrict you in your choice of  pharmacy. However, once we write for your prescriptions, we will NOT be re-sending more prescriptions to fix restricted supply problems created by your pharmacy, or your insurance.  The pharmacy listed in the electronic medical record should be the one where you want electronic prescriptions to be sent. If you choose to change pharmacy, simply notify our nursing staff. Changes will be made only during your regular appointments and not over the phone.  Recommendations: Keep all of your pain medications in a safe place, under lock and key, even if you live alone. We will NOT replace lost, stolen, or damaged medication. We do not accept "Police Reports" as proof of medications having been stolen. After you fill your prescription, take 1 week's worth of pills and put them away in a safe place. You should keep a separate, properly labeled bottle for this purpose. The remainder should be kept in the original bottle. Use this as your primary supply, until it runs out. Once it's gone, then you know that you have 1 week's worth of medicine, and it is time to come in for a prescription refill. If you do this correctly, it is unlikely that you will ever run out of medicine. To make sure that the above recommendation works, it is very important that you make sure your medication refill appointments are scheduled at least 1 week before you run out of medicine. To do this in an effective manner, make sure that you do not leave the office without  scheduling your next medication management appointment. Always ask the nursing staff to show you in your prescription , when your medication will be running out. Then arrange for the receptionist to get you a return appointment, at least 7 days before you run out of medicine. Do not wait until you have 1 or 2 pills left, to come in. This is very poor planning and does not take into consideration that we may need to cancel appointments due to bad weather, sickness, or emergencies  affecting our staff. DO NOT ACCEPT A "Partial Fill": If for any reason your pharmacy does not have enough pills/tablets to completely fill or refill your prescription, do not allow for a "partial fill". The law allows the pharmacy to complete that prescription within 72 hours, without requiring a new prescription. If they do not fill the rest of your prescription within those 72 hours, you will need a separate prescription to fill the remaining amount, which we will NOT provide. If the reason for the partial fill is your insurance, you will need to talk to the pharmacist about payment alternatives for the remaining tablets, but again, DO NOT ACCEPT A PARTIAL FILL, unless you can trust your pharmacist to obtain the remainder of the pills within 72 hours.  Prescription refills and/or changes in medication(s):  Prescription refills, and/or changes in dose or medication, will be conducted only during scheduled medication management appointments. (Applies to both, written and electronic prescriptions.) No refills on procedure days. No medication will be changed or started on procedure days. No changes, adjustments, and/or refills will be conducted on a procedure day. Doing so will interfere with the diagnostic portion of the procedure. No phone refills. No medications will be "called into the pharmacy". No Fax refills. No weekend refills. No Holliday refills. No after hours refills.  Remember:  Business hours are:  Monday to Thursday 8:00 AM to 4:00 PM Provider's Schedule: Delano Metz, MD - Appointments are:  Medication management: Monday and Wednesday 8:00 AM to 4:00 PM Procedure day: Tuesday and Thursday 7:30 AM to 4:00 PM Edward Jolly, MD - Appointments are:  Medication management: Tuesday and Thursday 8:00 AM to 4:00 PM Procedure day: Monday and Wednesday 7:30 AM to 4:00 PM (Last update: 11/22/2021) ______________________________________________________________________    ____________________________________________________________________________________________  Naloxone Nasal Spray  Why am I receiving this medication? [place] STOP ACT requires that all patients taking high dose opioids or at risk of opioids respiratory depression, be prescribed an opioid reversal agent, such as Naloxone (AKA: Narcan).  What is this medication? NALOXONE (nal OX one) treats opioid overdose, which causes slow or shallow breathing, severe drowsiness, or trouble staying awake. Call emergency services after using this medication. You may need additional treatment. Naloxone works by reversing the effects of opioids. It belongs to a group of medications called opioid blockers.  COMMON BRAND NAME(S): Kloxxado, Narcan  What should I tell my care team before I take this medication? They need to know if you have any of these conditions: Heart disease Substance use disorder An unusual or allergic reaction to naloxone, other medications, foods, dyes, or preservatives Pregnant or trying to get pregnant Breast-feeding  When to use this medication? This medication is to be used for the treatment of respiratory depression (less than 8 breaths per minute) secondary to opioid overdose.   How to use this medication? This medication is for use in the nose. Lay the person on their back. Support their neck with your hand and allow the head to tilt  back before giving the medication. The nasal spray should be given into 1 nostril. After giving the medication, move the person onto their side. Do not remove or test the nasal spray until ready to use. Get emergency medical help right away after giving the first dose of this medication, even if the person wakes up. You should be familiar with how to recognize the signs and symptoms of a narcotic overdose. If more doses are needed, give the additional dose in the other nostril. Talk to your care team about the use of this medication in children.  While this medication may be prescribed for children as young as newborns for selected conditions, precautions do apply.  Naloxone Overdosage: If you think you have taken too much of this medicine contact a poison control center or emergency room at once.  NOTE: This medicine is only for you. Do not share this medicine with others.  What if I miss a dose? This does not apply.  What may interact with this medication? This is only used during an emergency. No interactions are expected during emergency use. This list may not describe all possible interactions. Give your health care provider a list of all the medicines, herbs, non-prescription drugs, or dietary supplements you use. Also tell them if you smoke, drink alcohol, or use illegal drugs. Some items may interact with your medicine.  What should I watch for while using this medication? Keep this medication ready for use in the case of an opioid overdose. Make sure that you have the phone number of your care team and local hospital ready. You may need to have additional doses of this medication. Each nasal spray contains a single dose. Some emergencies may require additional doses. After use, bring the treated person to the nearest hospital or call 911. Make sure the treating care team knows that the person has received a dose of this medication. You will receive additional instructions on what to do during and after use of this medication before an emergency occurs.  What side effects may I notice from receiving this medication? Side effects that you should report to your care team as soon as possible: Allergic reactions--skin rash, itching, hives, swelling of the face, lips, tongue, or throat Side effects that usually do not require medical attention (report these to your care team if they continue or are bothersome): Constipation Dryness or irritation inside the nose Headache Increase in blood pressure Muscle spasms Stuffy  nose Toothache This list may not describe all possible side effects. Call your doctor for medical advice about side effects. You may report side effects to FDA at 1-800-FDA-1088.  Where should I keep my medication? Because this is an emergency medication, you should keep it with you at all times.  Keep out of the reach of children and pets. Store between 20 and 25 degrees C (68 and 77 degrees F). Do not freeze. Throw away any unused medication after the expiration date. Keep in original box until ready to use.  NOTE: This sheet is a summary. It may not cover all possible information. If you have questions about this medicine, talk to your doctor, pharmacist, or health care provider.   2023 Elsevier/Gold Standard (2020-09-24 00:00:00)  ____________________________________________________________________________________________

## 2022-07-18 NOTE — Progress Notes (Unsigned)
PROVIDER NOTE: Information contained herein reflects review and annotations entered in association with encounter. Interpretation of such information and data should be left to medically-trained personnel. Information provided to patient can be located elsewhere in the medical record under "Patient Instructions". Document created using STT-dictation technology, any transcriptional errors that may result from process are unintentional.    Patient: Tracy Mann  Service Category: E/M  Provider: Oswaldo Done, MD  DOB: 25-Aug-1933  DOS: 07/19/2022  Referring Provider: Burnett Corrente  MRN: [id number]  Specialty: Interventional Pain Management  PCP: Bosie Clos, MD  Type: Established Patient  Setting: Ambulatory outpatient    Location: Office  Delivery: Face-to-face     HPI  [name], a 89 y.o. year old female, is here today because of her Chronic pain syndrome [G89.4]. [name] primary complain today is No chief complaint on file.  Pertinent problems: [name] has Cervical muscle strain; DDD (degenerative disc disease), lumbosacral; Chronic venous insufficiency; Chronic pain syndrome; Abnormal MRI, lumbar spine (08/22/2019); Lumbar facet hypertrophy (Multilevel) (Bilateral); Lumbar central spinal stenosis (SEVERE) (L3-4) with neurogenic claudication; Grade 1 Anterolisthesis of lumbar spine (L4/L5); Lumbar facet arthropathy (L3-4 right-sided facet edema/joint effusion); Lumbar lateral recess stenosis (L4-5) (Right); Chronic low back pain (3ry area of Pain) (Bilateral) w/ sciatica (Bilateral); Lumbosacral radiculopathy at L5 (Bilateral); Chronic lower extremity pain (1ry area of Pain) (Bilateral); Lumbosacral facet syndrome; Chronic hip pain (2ry area of Pain) (Bilateral) (R>L); Osteoarthritis involving multiple joints; Osteoarthritis of knee (Right); Osteoarthritis of facet joint of lumbar spine; Osteoarthritis of lumbar spine; Bilateral lower extremity edema; Cellulitis of right  lower leg; Chronic knee pain (Bilateral); Peripheral vascular disease (HCC) (lower extremity) (Bilateral); Cervicalgia; Chronic upper back pain; and Lumbosacral spondylosis with radiculopathy on their pertinent problem list. Pain Assessment: Severity of   is reported as a  /10. Location:    / . Onset:  . Quality:  . Timing:  . Modifying factor(s):  Marland Kitchen Vitals:  vitals were not taken for this visit.  BMI: Estimated body mass index is 34.31 kg/m as calculated from the following:   Height as of 06/20/22: 5' 5.5" (1.664 m).   Weight as of 06/20/22: 209 lb 6 oz (95 kg). Last encounter: 04/19/2022. Last procedure: 08/04/2021.  Reason for encounter: medication management. ***  Routine UDS ordered today.   RTCB: 10/23/2022   Pharmacotherapy Assessment  Analgesic: Hydrocodone/APAP 10/325, (see the 05/09/2021 note) using 1.675 pills/day. MME/day: 10-20 mg/day   Monitoring: [place] PMP: PDMP reviewed during this encounter.       Pharmacotherapy: No side-effects or adverse reactions reported. Compliance: No problems identified. Effectiveness: Clinically acceptable.  No notes on file  No results found for: "CBDTHCR" No results found for: "D8THCCBX" No results found for: "D9THCCBX"  UDS:  Summary  Date Value Ref Range Status  05/09/2021 Note  Final    Comment:    ==================================================================== [employer] Select 13 (MW) ==================================================================== Test                             Result       Flag       Units  Drug Present and Declared for Prescription Verification   Lorazepam                      1824         EXPECTED   ng/mg creat    Source of lorazepam is a scheduled prescription  medication.    Hydrocodone                    2020         EXPECTED   ng/mg creat   Hydromorphone                  234          EXPECTED   ng/mg creat   Dihydrocodeine                 495          EXPECTED   ng/mg creat   Norhydrocodone                  2173         EXPECTED   ng/mg creat    Sources of hydrocodone include scheduled prescription medications.    Hydromorphone, dihydrocodeine and norhydrocodone are expected    metabolites of hydrocodone. Hydromorphone and dihydrocodeine are    also available as scheduled prescription medications.  ==================================================================== Test                      Result    Flag   Units      Ref Range   Creatinine              80               mg/dL      >=82 ==================================================================== Declared Medications:  The flagging and interpretation on this report are based on the  following declared medications.  Unexpected results may arise from  inaccuracies in the declared medications.   **Note: The testing scope of this panel includes these medications:   Hydrocodone (Norco)  Lorazepam (Ativan)   **Note: The testing scope of this panel does not include the  following reported medications:   Acetaminophen (Norco)  Albuterol (Ventolin HFA)  Estradiol (Estrace)  Ezetimibe (Zetia)  Flavoxate (Urispas)  Fluticasone (Flonase)  Insulin (Lantus)  Levothyroxine (Synthroid)  Lisinopril (Zestril)  Magnesium  Metolazone  Metoprolol (Toprol)  Nitroglycerin (Nitrostat)  Potassium (Klor-Con)  Ropinirole (Requip)  Torsemide (Demadex)  Undefined Miscellaneous Drug  Warfarin (Coumadin) ==================================================================== For clinical consultation, please call [phone]. ====================================================================       ROS  Constitutional: Denies any fever or chills Gastrointestinal: No reported hemesis, hematochezia, vomiting, or acute GI distress Musculoskeletal: Denies any acute onset joint swelling, redness, loss of ROM, or weakness Neurological: No reported episodes of acute onset apraxia, aphasia, dysarthria, agnosia, amnesia, paralysis, loss  of coordination, or loss of consciousness  Medication Review  FreeStyle Libre 3 Reader, FreeStyle Libre 3 Sensor, HYDROcodone-acetaminophen, Insulin Pen Needle, LORazepam, Magnesium, NONFORMULARY OR COMPOUNDED ITEM, albuterol, apixaban, ezetimibe, flavoxATE, fluticasone, insulin glargine, insulin lispro, levothyroxine, lisinopril, metolazone, naloxone, nitroGLYCERIN, nitrofurantoin, ondansetron, pantoprazole, phenazopyridine, polyethylene glycol powder, rOPINIRole, and torsemide  History Review  Allergy: Tracy Mann is allergic to duloxetine, paroxetine, sulfa antibiotics, cephalexin, clarithromycin, diphenhydramine, estrogens conjugated, estrogens conjugated, and nitrofurantoin. Drug: Tracy Mann  reports no history of drug use. Alcohol:  reports no history of alcohol use. Tobacco:  reports that she has never smoked. She has never used smokeless tobacco. Social: Tracy Mann  reports that she has never smoked. She has never used smokeless tobacco. She reports that she does not drink alcohol and does not use drugs. Medical:  has a past medical history of Acute myocardial infarction of inferior wall (HCC), Arthritis, Chronic anticoagulation, Chronic atrial fibrillation (HCC), Chronic combined systolic  and diastolic CHF (congestive heart failure) (HCC), Essential hypertension, Hyperlipidemia, mixed, Mixed Ischemic/Nonischemic Cardiomyopathy, Nonobstructive coronary artery disease, Obesity, Thyroid disease, Type II diabetes mellitus (HCC), Venous insufficiency, and Vitamin D deficiency. Surgical: Tracy Mann  has a past surgical history that includes Vesicovaginal fistula closure w/ TAH (1996); Tubal ligation (1968); Cholecystectomy (1999); and Cardiac catheterization (11/2012). Family: family history includes Heart disease in her mother; Thyroid disease in her father.  Laboratory Chemistry Profile   Renal Lab Results  Component Value Date   BUN 31 (H) 03/15/2022   CREATININE 1.32 (H) 03/15/2022   BCR 23  03/15/2022   GFRAA 73 12/31/2019   GFRNONAA 36 (L) 09/19/2021    Hepatic Lab Results  Component Value Date   AST 20 03/15/2022   ALT 10 03/15/2022   ALBUMIN 4.1 03/15/2022   ALKPHOS 114 03/15/2022    Electrolytes Lab Results  Component Value Date   NA 141 03/15/2022   K 4.7 03/15/2022   CL 101 03/15/2022   CALCIUM 9.2 03/15/2022   MG 2.1 11/26/2019   PHOS 3.9 09/08/2014    Bone Lab Results  Component Value Date   25OHVITD1 43 11/26/2019   25OHVITD2 9.2 11/26/2019   25OHVITD3 34 11/26/2019    Inflammation (CRP: Acute Phase) (ESR: Chronic Phase) Lab Results  Component Value Date   CRP 4 11/26/2019   ESRSEDRATE 45 (H) 11/26/2019   LATICACIDVEN 1.4 01/09/2020         Note: Above Lab results reviewed.  Recent Imaging Review  US RENAL CLINICAL DATA:  Right flank pain.  EXAM: RENAL / URINARY TRACT ULTRASOUND COMPLETE  COMPARISON:  Renal ultrasound April 11, 2019 and Jun 03, 2021. CT of the abdomen May 05, 2019.  FINDINGS: Right Kidney:  Renal measurements: 9.3 x 3.5 x 4.5 cm = volume: 78 mL. Echogenicity within normal limits. No mass or hydronephrosis visualized.  Left Kidney:  Renal measurements: 10.5 x 4.3 x 4.9 cm = volume: 117 mL. A subtle masslike region is again identified in the midpole measuring 2.9 x 2.3 x 2.3 cm today. A similar mass like region was identified in this area on the April 11, 2019 ultrasound measuring 3.2 x 2.4 x 2.5 cm at that time. A CT scan dated May 05, 2019 demonstrated no CT correlate. The finding was again identified on the Jun 03, 2021 comparison measuring 2.4 x 1.7 by 2.1 cm. The difference in measurement was probably technical.  Bladder:  Appears normal for degree of bladder distention.  Other:  None.  IMPRESSION: 1. A subtle masslike region is again identified in the midpole of the left kidney measuring 2.9 x 2.3 x 2.3 cm today. A similar masslike region was identified in this area on the April 11, 2019 ultrasound measuring 3.2 x 2.4 x 2.5 cm at that time. A CT scan dated May 05, 2019 demonstrated no CT correlate. Given 3 years of sonographic stability and the lack of a mass on the comparison CT scan, the finding could represent a prominent column of Bertin. MRI could definitively assess this region if clinically warranted. 2. No other abnormalities.  Electronically Signed   By: Gerome Sam III M.D.   On: 05/09/2022 18:52 Note: Reviewed        Physical Exam  General appearance: Well nourished, well developed, and well hydrated. In no apparent acute distress Mental status: Alert, oriented x 3 (person, place, & time)       Respiratory: No evidence of acute respiratory distress Eyes: PERLA Vitals: There  were no vitals taken for this visit. BMI: Estimated body mass index is 34.31 kg/m as calculated from the following:   Height as of 06/20/22: 5' 5.5" (1.664 m).   Weight as of 06/20/22: 209 lb 6 oz (95 kg). Ideal: Patient weight not recorded  Assessment   Diagnosis Status  1. Chronic pain syndrome   2. Chronic lower extremity pain (1ry area of Pain) (Bilateral)   3. Chronic hip pain (2ry area of Pain) (Bilateral) (R>L)   4. Chronic low back pain (3ry area of Pain) (Bilateral) w/ sciatica (Bilateral)   5. Lumbosacral facet syndrome   6. Chronic upper back pain   7. Chronic knee pain (Bilateral)   8. Pharmacologic therapy   9. Chronic use of opiate for therapeutic purpose   10. Encounter for medication management   11. Encounter for chronic pain management    Controlled Controlled Controlled   Updated Problems: No problems updated.  Plan of Care  Problem-specific:  No problem-specific Assessment & Plan notes found for this encounter.  Tracy Mann has a current medication list which includes the following long-term medication(s): albuterol, apixaban, freestyle libre 3 reader, freestyle libre 3 sensor, ezetimibe, fluticasone, hydrocodone-acetaminophen, insulin  lispro, levothyroxine, lisinopril, metolazone, nitroglycerin, ropinirole, and torsemide.  Pharmacotherapy (Medications Ordered): No orders of the defined types were placed in this encounter.  Orders:  No orders of the defined types were placed in this encounter.  Follow-up plan:   No follow-ups on file.      Interventional Therapies  Risk  Complexity Considerations:   NOTE: COUMADIN Anticoagulation (Stop: 5 days  Restart: 2 hours) Decreased GFR Obstructive sleep apnea   Planned  Pending:      Under consideration:   Diagnostic midline caudal ESI #1 + diagnostic epidurogram  Diagnostic bilateral IA hip joint injection #1  Diagnostic right L3-4 LESI #1  Diagnostic right L4 TFESI #1  Diagnostic bilateral L3 TFESI #1  Diagnostic bilateral lumbar facet MBB #1    Completed:   Diagnostic/therapeutic midline L4-5 LESI x2 (08/04/2021) (100/100/100/100)    Completed by Dr. Mariah Milling [facility]):   Diagnostic bilateral L4 TFESI x1 (09/12/2019 - Dr. Mariah Milling [[facility]])    Therapeutic  Palliative (PRN) options:   Diagnostic/therapeutic midline L4-5 LESI         Recent Visits Date Type Provider Dept  04/19/22 Office Visit Delano Metz, MD [facility]  Showing recent visits within past 90 days and meeting all other requirements Future Appointments Date Type Provider Dept  07/19/22 Appointment Delano Metz, MD [facility]  Showing future appointments within next 90 days and meeting all other requirements  I discussed the assessment and treatment plan with the patient. The patient was provided an opportunity to ask questions and all were answered. The patient agreed with the plan and demonstrated an understanding of the instructions.  Patient advised to call back or seek an in-person evaluation if the symptoms or condition worsens.  Duration of encounter: *** minutes.  Total time on encounter, as per AMA guidelines included both the face-to-face and  non-face-to-face time personally spent by the physician and/or other qualified health care professional(s) on the day of the encounter (includes time in activities that require the physician or other qualified health care professional and does not include time in activities normally performed by clinical staff). Physician's time may include the following activities when performed: Preparing to see the patient (e.g., pre-charting review of records, searching for previously ordered imaging, lab work, and nerve conduction tests)  Review of prior analgesic pharmacotherapies. Reviewing PMP Interpreting ordered tests (e.g., lab work, imaging, nerve conduction tests) Performing post-procedure evaluations, including interpretation of diagnostic procedures Obtaining and/or reviewing separately obtained history Performing a medically appropriate examination and/or evaluation Counseling and educating the patient/family/caregiver Ordering medications, tests, or procedures Referring and communicating with other health care professionals (when not separately reported) Documenting clinical information in the electronic or other health record Independently interpreting results (not separately reported) and communicating results to the patient/ family/caregiver Care coordination (not separately reported)  Note by: Oswaldo Done, MD Date: 07/19/2022; Time: 8:20 AM

## 2022-07-19 ENCOUNTER — Ambulatory Visit: Payer: Medicare Other | Attending: Pain Medicine | Admitting: Pain Medicine

## 2022-07-19 ENCOUNTER — Encounter: Payer: Self-pay | Admitting: Pain Medicine

## 2022-07-19 VITALS — BP 125/55 | HR 91 | Temp 96.3°F | Resp 16 | Ht 65.0 in | Wt 204.0 lb

## 2022-07-19 DIAGNOSIS — M79604 Pain in right leg: Secondary | ICD-10-CM | POA: Diagnosis not present

## 2022-07-19 DIAGNOSIS — M47817 Spondylosis without myelopathy or radiculopathy, lumbosacral region: Secondary | ICD-10-CM

## 2022-07-19 DIAGNOSIS — G894 Chronic pain syndrome: Secondary | ICD-10-CM | POA: Diagnosis present

## 2022-07-19 DIAGNOSIS — M5442 Lumbago with sciatica, left side: Secondary | ICD-10-CM | POA: Diagnosis present

## 2022-07-19 DIAGNOSIS — G8929 Other chronic pain: Secondary | ICD-10-CM | POA: Diagnosis present

## 2022-07-19 DIAGNOSIS — Z79891 Long term (current) use of opiate analgesic: Secondary | ICD-10-CM | POA: Diagnosis present

## 2022-07-19 DIAGNOSIS — M5441 Lumbago with sciatica, right side: Secondary | ICD-10-CM

## 2022-07-19 DIAGNOSIS — M25562 Pain in left knee: Secondary | ICD-10-CM | POA: Diagnosis present

## 2022-07-19 DIAGNOSIS — M25552 Pain in left hip: Secondary | ICD-10-CM | POA: Insufficient documentation

## 2022-07-19 DIAGNOSIS — M549 Dorsalgia, unspecified: Secondary | ICD-10-CM | POA: Diagnosis present

## 2022-07-19 DIAGNOSIS — M79605 Pain in left leg: Secondary | ICD-10-CM | POA: Insufficient documentation

## 2022-07-19 DIAGNOSIS — M25551 Pain in right hip: Secondary | ICD-10-CM | POA: Diagnosis not present

## 2022-07-19 DIAGNOSIS — M25561 Pain in right knee: Secondary | ICD-10-CM | POA: Diagnosis present

## 2022-07-19 DIAGNOSIS — Z79899 Other long term (current) drug therapy: Secondary | ICD-10-CM | POA: Diagnosis present

## 2022-07-19 MED ORDER — HYDROCODONE-ACETAMINOPHEN 10-325 MG PO TABS: 1.0000 | ORAL_TABLET | Freq: Three times a day (TID) | ORAL | 0 refills | Status: DC | PRN

## 2022-07-19 NOTE — Progress Notes (Signed)
Nursing Pain Medication Assessment:  Safety precautions to be maintained throughout the outpatient stay will include: orient to surroundings, keep bed in low position, maintain call bell within reach at all times, provide assistance with transfer out of bed and ambulation.  Medication Inspection Compliance: Pill count conducted under aseptic conditions, in front of the patient. Neither the pills nor the bottle was removed from the patient's sight at any time. Once count was completed pills were immediately returned to the patient in their original bottle.  Medication: See above Pill/Patch Count:  19 of 60 pills remain Pill/Patch Appearance: Markings consistent with prescribed medication Bottle Appearance: Standard pharmacy container. Clearly labeled. Filled Date: 05 / 26 / 2024 Last Medication intake:  Today

## 2022-07-24 LAB — TOXASSURE SELECT 13 (MW), URINE

## 2022-10-16 ENCOUNTER — Encounter: Payer: Medicare Other | Admitting: Pain Medicine

## 2022-10-16 ENCOUNTER — Ambulatory Visit: Payer: Medicare Other | Admitting: Urology

## 2022-10-18 ENCOUNTER — Encounter: Payer: Self-pay | Admitting: Pain Medicine

## 2022-10-18 ENCOUNTER — Ambulatory Visit: Payer: Medicare Other | Attending: Pain Medicine | Admitting: Pain Medicine

## 2022-10-18 VITALS — BP 133/94 | HR 94 | Temp 97.1°F | Ht 63.0 in | Wt 204.0 lb

## 2022-10-18 DIAGNOSIS — M549 Dorsalgia, unspecified: Secondary | ICD-10-CM | POA: Insufficient documentation

## 2022-10-18 DIAGNOSIS — M25561 Pain in right knee: Secondary | ICD-10-CM | POA: Diagnosis present

## 2022-10-18 DIAGNOSIS — M25562 Pain in left knee: Secondary | ICD-10-CM | POA: Diagnosis present

## 2022-10-18 DIAGNOSIS — M25552 Pain in left hip: Secondary | ICD-10-CM | POA: Insufficient documentation

## 2022-10-18 DIAGNOSIS — M25551 Pain in right hip: Secondary | ICD-10-CM | POA: Insufficient documentation

## 2022-10-18 DIAGNOSIS — M79604 Pain in right leg: Secondary | ICD-10-CM | POA: Insufficient documentation

## 2022-10-18 DIAGNOSIS — G8929 Other chronic pain: Secondary | ICD-10-CM | POA: Diagnosis present

## 2022-10-18 DIAGNOSIS — M47817 Spondylosis without myelopathy or radiculopathy, lumbosacral region: Secondary | ICD-10-CM | POA: Diagnosis present

## 2022-10-18 DIAGNOSIS — M5442 Lumbago with sciatica, left side: Secondary | ICD-10-CM | POA: Diagnosis present

## 2022-10-18 DIAGNOSIS — Z79891 Long term (current) use of opiate analgesic: Secondary | ICD-10-CM | POA: Insufficient documentation

## 2022-10-18 DIAGNOSIS — Z79899 Other long term (current) drug therapy: Secondary | ICD-10-CM | POA: Insufficient documentation

## 2022-10-18 DIAGNOSIS — G894 Chronic pain syndrome: Secondary | ICD-10-CM | POA: Insufficient documentation

## 2022-10-18 DIAGNOSIS — M5441 Lumbago with sciatica, right side: Secondary | ICD-10-CM | POA: Insufficient documentation

## 2022-10-18 DIAGNOSIS — M79605 Pain in left leg: Secondary | ICD-10-CM | POA: Diagnosis present

## 2022-10-18 MED ORDER — NALOXONE HCL 4 MG/0.1ML NA LIQD
1.0000 | NASAL | 0 refills | Status: AC | PRN
Start: 2022-10-18 — End: 2023-10-18

## 2022-10-18 MED ORDER — HYDROCODONE-ACETAMINOPHEN 10-325 MG PO TABS: 1.0000 | ORAL_TABLET | Freq: Three times a day (TID) | ORAL | 0 refills | Status: DC | PRN

## 2022-10-18 NOTE — Patient Instructions (Signed)
______________________________________________________________________    Opioid Pain Medication Update  To: All patients taking opioid pain medications. (I.e.: hydrocodone, hydromorphone, oxycodone, oxymorphone, morphine, codeine, methadone, tapentadol, tramadol, buprenorphine, fentanyl, etc.)  Re: Updated review of side effects and adverse reactions of opioid analgesics, as well as new information about long term effects of this class of medications.  Direct risks of long-term opioid therapy are not limited to opioid addiction and overdose. Potential medical risks include serious fractures, breathing problems during sleep, hyperalgesia, immunosuppression, chronic constipation, bowel obstruction, myocardial infarction, and tooth decay secondary to xerostomia.  Unpredictable adverse effects that can occur even if you take your medication correctly: Cognitive impairment, respiratory depression, and death. Most people think that if they take their medication "correctly", and "as instructed", that they will be safe. Nothing could be farther from the truth. In reality, a significant amount of recorded deaths associated with the use of opioids has occurred in individuals that had taken the medication for a long time, and were taking their medication correctly. The following are examples of how this can happen: Patient taking his/her medication for a long time, as instructed, without any side effects, is given a certain antibiotic or another unrelated medication, which in turn triggers a "Drug-to-drug interaction" leading to disorientation, cognitive impairment, impaired reflexes, respiratory depression or an untoward event leading to serious bodily harm or injury, including death.  Patient taking his/her medication for a long time, as instructed, without any side effects, develops an acute impairment of liver and/or kidney function. This will lead to a rapid inability of the body to breakdown and eliminate  their pain medication, which will result in effects similar to an "overdose", but with the same medicine and dose that they had always taken. This again may lead to disorientation, cognitive impairment, impaired reflexes, respiratory depression or an untoward event leading to serious bodily harm or injury, including death.  A similar problem will occur with patients as they grow older and their liver and kidney function begins to decrease as part of the aging process.  Background information: Historically, the original case for using long-term opioid therapy to treat chronic noncancer pain was based on safety assumptions that subsequent experience has called into question. In 1996, the American Pain Society and the American Academy of Pain Medicine issued a consensus statement supporting long-term opioid therapy. This statement acknowledged the dangers of opioid prescribing but concluded that the risk for addiction was low; respiratory depression induced by opioids was short-lived, occurred mainly in opioid-naive patients, and was antagonized by pain; tolerance was not a common problem; and efforts to control diversion should not constrain opioid prescribing. This has now proven to be wrong. Experience regarding the risks for opioid addiction, misuse, and overdose in community practice has failed to support these assumptions.  According to the Centers for Disease Control and Prevention, fatal overdoses involving opioid analgesics have increased sharply over the past decade. Currently, more than 96,700 people die from drug overdoses every year. Opioids are a factor in 7 out of every 10 overdose deaths. Deaths from drug overdose have surpassed motor vehicle accidents as the leading cause of death for individuals between the ages of 29 and 72.  Clinical data suggest that neuroendocrine dysfunction may be very common in both men and women, potentially causing hypogonadism, erectile dysfunction, infertility,  decreased libido, osteoporosis, and depression. Recent studies linked higher opioid dose to increased opioid-related mortality. Controlled observational studies reported that long-term opioid therapy may be associated with increased risk for cardiovascular events. Subsequent  meta-analysis concluded that the safety of long-term opioid therapy in elderly patients has not been proven.   Side Effects and adverse reactions: Common side effects: Drowsiness (sedation). Dizziness. Nausea and vomiting. Constipation. Physical dependence -- Dependence often manifests with withdrawal symptoms when opioids are discontinued or decreased. Tolerance -- As you take repeated doses of opioids, you require increased medication to experience the same effect of pain relief. Respiratory depression -- This can occur in healthy people, especially with higher doses. However, people with COPD, asthma or other lung conditions may be even more susceptible to fatal respiratory impairment.  Uncommon side effects: An increased sensitivity to feeling pain and extreme response to pain (hyperalgesia). Chronic use of opioids can lead to this. Delayed gastric emptying (the process by which the contents of your stomach are moved into your small intestine). Muscle rigidity. Immune system and hormonal dysfunction. Quick, involuntary muscle jerks (myoclonus). Arrhythmia. Itchy skin (pruritus). Dry mouth (xerostomia).  Long-term side effects: Chronic constipation. Sleep-disordered breathing (SDB). Increased risk of bone fractures. Hypothalamic-pituitary-adrenal dysregulation. Increased risk of overdose.  RISKS: Respiratory depression and death: Opioids increase the risk of respiratory depression and death.  Drug-to-drug interactions: Opioids are relatively contraindicated in combination with benzodiazepines, sleep inducers, and other central nervous system depressants. Other classes of medications (i.e.: certain antibiotics  and even over-the-counter medications) may also trigger or induce respiratory depression in some patients.  Medical conditions: Patients with pre-existing respiratory problems are at higher risk of respiratory failure and/or depression when in combination with opioid analgesics. Opioids are relatively contraindicated in some medical conditions such as central sleep apnea.   Fractures and Falls:  Opioids increase the risk and incidence of falls. This is of particular importance in elderly patients.  Endocrine System:  Long-term administration is associated with endocrine abnormalities (endocrinopathies). (Also known as Opioid-induced Endocrinopathy) Influences on both the hypothalamic-pituitary-adrenal axis?and the hypothalamic-pituitary-gonadal axis have been demonstrated with consequent hypogonadism and adrenal insufficiency in both sexes. Hypogonadism and decreased levels of dehydroepiandrosterone sulfate have been reported in men and women. Endocrine effects include: Amenorrhoea in women (abnormal absence of menstruation) Reduced libido in both sexes Decreased sexual function Erectile dysfunction in men Hypogonadisms (decreased testicular function with shrinkage of testicles) Infertility Depression and fatigue Loss of muscle mass Anxiety Depression Immune suppression Hyperalgesia Weight gain Anemia Osteoporosis Patients (particularly women of childbearing age) should avoid opioids. There is insufficient evidence to recommend routine monitoring of asymptomatic patients taking opioids in the long-term for hormonal deficiencies.  Immune System: Human studies have demonstrated that opioids have an immunomodulating effect. These effects are mediated via opioid receptors both on immune effector cells and in the central nervous system. Opioids have been demonstrated to have adverse effects on antimicrobial response and anti-tumour surveillance. Buprenorphine has been demonstrated to have  no impact on immune function.  Opioid Induced Hyperalgesia: Human studies have demonstrated that prolonged use of opioids can lead to a state of abnormal pain sensitivity, sometimes called opioid induced hyperalgesia (OIH). Opioid induced hyperalgesia is not usually seen in the absence of tolerance to opioid analgesia. Clinically, hyperalgesia may be diagnosed if the patient on long-term opioid therapy presents with increased pain. This might be qualitatively and anatomically distinct from pain related to disease progression or to breakthrough pain resulting from development of opioid tolerance. Pain associated with hyperalgesia tends to be more diffuse than the pre-existing pain and less defined in quality. Management of opioid induced hyperalgesia requires opioid dose reduction.  Cancer: Chronic opioid therapy has been associated with an increased risk  of cancer among noncancer patients with chronic pain. This association was more evident in chronic strong opioid users. Chronic opioid consumption causes significant pathological changes in the small intestine and colon. Epidemiological studies have found that there is a link between opium dependence and initiation of gastrointestinal cancers. Cancer is the second leading cause of death after cardiovascular disease. Chronic use of opioids can cause multiple conditions such as GERD, immunosuppression and renal damage as well as carcinogenic effects, which are associated with the incidence of cancers.   Mortality: Long-term opioid use has been associated with increased mortality among patients with chronic non-cancer pain (CNCP).  Prescription of long-acting opioids for chronic noncancer pain was associated with a significantly increased risk of all-cause mortality, including deaths from causes other than overdose.  Reference: Von Korff M, Kolodny A, Deyo RA, Chou R. Long-term opioid therapy reconsidered. Ann Intern Med. 2011 Sep 6;155(5):325-8. doi:  10.7326/[id number]. PMID: 16109604; PMCID: [id number]. Randon Goldsmith, Hayward RA, Dunn KM, [place] KP. Risk of adverse events in patients prescribed long-term opioids: A cohort study in the [place] [facility] Research Datalink. Eur J Pain. 2019 May;23(5):908-922. doi: 10.1002/ejp.1357. Epub 2019 Jan 31. PMID: 78295621. Colameco S, Coren JS, Ciervo CA. Continuous opioid treatment for chronic noncancer pain: a time for moderation in prescribing. Postgrad Med. 2009 Jul;121(4):61-6. doi: 10.3810/pgm.2009.07.2032. PMID: 30865784. William Hamburger RN, Gates SD, Blazina I, Cristopher Peru, Bougatsos C, Deyo RA. The effectiveness and risks of long-term opioid therapy for chronic pain: a systematic review for a [employer] of [employer] Pathways to [employer]. Ann Intern Med. 2015 Feb 17;162(4):276-86. doi: 10.7326/M14-2559. PMID: 69629528. Caryl Bis [facility], [place] DM. NCHS Data Brief No. 22. [place]: Centers for Disease Control and Prevention; 2009. Sep, Increase in Fatal Poisonings Involving Opioid Analgesics in the [place], 1999-2006. Song IA, Choi HR, Oh TK. Long-term opioid use and mortality in patients with chronic non-cancer pain: Ten-year follow-up study in [place] from 2010 through 2019. EClinicalMedicine. 2022 Jul 18;51:101558. doi: 10.1016/j.eclinm.2022.413244. PMID: 01027253; PMCID: [id number]. Huser, W., Schubert, T., Vogelmann, T. et al. All-cause mortality in patients with long-term opioid therapy compared with non-opioid analgesics for chronic non-cancer pain: a database study. BMC Med 18, 162 (2020). http://lester.info/ Rashidian H, Karie Kirks, Malekzadeh R, Haghdoost AA. An Ecological Study of the Association between Opiate Use and Incidence of Cancers. Addict Health. 2016 Fall;8(4):252-260. PMID: 59563875; PMCID: [id number].  Our Goal: Our goal is to control your pain with means other  than the use of opioid pain medications.  Our Recommendation: Talk to your physician about coming off of these medications. We can assist you with the tapering down and stopping these medicines. Based on the new information, even if you cannot completely stop the medication, a decrease in the dose may be associated with a lesser risk. Ask for other means of controlling the pain. Decrease or eliminate those factors that significantly contribute to your pain such as smoking, obesity, and a diet heavily tilted towards "inflammatory" nutrients.  Last Updated: 08/07/2022   ______________________________________________________________________       ______________________________________________________________________    National Pain Medication Shortage  The [place] is experiencing worsening drug shortages. These have had a negative widespread effect on patient care and treatment. Not expected to improve any time soon. Predicted to last past 2029.   Drug shortage list (generic names) Oxycodone IR Oxycodone/APAP Oxymorphone IR Hydromorphone Hydrocodone/APAP Morphine  Where is the problem?  Manufacturing and supply level.  Will this shortage  affect you?  Only if you take any of the above pain medications.  How? You may be unable to fill your prescription.  Your pharmacist may offer a "partial fill" of your prescription. (Warning: Do not accept partial fills.) Prescriptions partially filled cannot be transferred to another pharmacy. Read our Medication Rules and Regulation. Depending on how much medicine you are dependent on, you may experience withdrawals when unable to get the medication.  Recommendations: Consider ending your dependence on opioid pain medications. Ask your pain specialist to assist you with the process. Consider switching to a medication currently not in shortage, such as Buprenorphine. Talk to your pain specialist about this option. Consider decreasing your pain  medication requirements by managing tolerance thru "Drug Holidays". This may help minimize withdrawals, should you run out of medicine. Control your pain thru the use of non-pharmacological interventional therapies.   Your prescriber: Prescribers cannot be blamed for shortages. Medication manufacturing and supply issues cannot be fixed by the prescriber.   NOTE: The prescriber is not responsible for supplying the medication, or solving supply issues. Work with your pharmacist to solve it. The patient is responsible for the decision to take or continue taking the medication and for identifying and securing a legal supply source. By law, supplying the medication is the job and responsibility of the pharmacy. The prescriber is responsible for the evaluation, monitoring, and prescribing of these medications.   Prescribers will NOT: Re-issue prescriptions that have been partially filled. Re-issue prescriptions already sent to a pharmacy.  Re-send prescriptions to a different pharmacy because yours did not have your medication. Ask pharmacist to order more medicine or transfer the prescription to another pharmacy. (Read below.)  New 2023 regulation: "September 30, 2021 Revised Regulation Allows DEA-Registered Pharmacies to Transfer Electronic Prescriptions at a Patient's Request DEA Headquarters Division - Public Information Office Patients now have the ability to request their electronic prescription be transferred to another pharmacy without having to go back to their practitioner to initiate the request. This revised regulation went into effect on Monday, September 26, 2021.     At a patient's request, a DEA-registered retail pharmacy can now transfer an electronic prescription for a controlled substance (schedules II-V) to another DEA-registered retail pharmacy. Prior to this change, patients would have to go through their practitioner to cancel their prescription and have it re-issued to a different  pharmacy. The process was taxing and time consuming for both patients and practitioners.    The Drug Enforcement Administration [facility]) published its intent to revise the process for transferring electronic prescriptions on December 19, 2019.  The final rule was published in the federal register on August 25, 2021 and went into effect 30 days later.  Under the final rule, a prescription can only be transferred once between pharmacies, and only if allowed under existing state or other applicable law. The prescription must remain in its electronic form; may not be altered in any way; and the transfer must be communicated directly between two licensed pharmacists. It's important to note, any authorized refills transfer with the original prescription, which means the entire prescription will be filled at the same pharmacy".  Reference: [email or web address].is [facility] website announcement)  [email or web address] [employer] of Justice)   [employer] / Vol. 88, No. 143 / Thursday, August 25, 2021 / Rules and Regulations DEPARTMENT OF JUSTICE  Drug Enforcement Administration  21 CFR Part 1306  [Docket No. [id number]]  RIN [id number] Transfer of Electronic Prescriptions for Schedules II-V Controlled Substances Between  Pharmacies for Initial Filling  ______________________________________________________________________       ______________________________________________________________________    Transfer of Pain Medication between Pharmacies  Re: 2023 DEA Clarification on existing regulation  Published on DEA Website: September 30, 2021  Title: Revised Regulation Allows DEA-Registered Pharmacies to Electrical engineer Prescriptions at a Patient's Request DEA Headquarters Division - [employer]  "Patients now have the ability to  request their electronic prescription be transferred to another pharmacy without having to go back to their practitioner to initiate the request. This revised regulation went into effect on Monday, September 26, 2021.     At a patient's request, a DEA-registered retail pharmacy can now transfer an electronic prescription for a controlled substance (schedules II-V) to another DEA-registered retail pharmacy. Prior to this change, patients would have to go through their practitioner to cancel their prescription and have it re-issued to a different pharmacy. The process was taxing and time consuming for both patients and practitioners.    The Drug Enforcement Administration [facility]) published its intent to revise the process for transferring electronic prescriptions on December 19, 2019.  The final rule was published in the federal register on August 25, 2021 and went into effect 30 days later.  Under the final rule, a prescription can only be transferred once between pharmacies, and only if allowed under existing state or other applicable law. The prescription must remain in its electronic form; may not be altered in any way; and the transfer must be communicated directly between two licensed pharmacists. It's important to note, any authorized refills transfer with the original prescription, which means the entire prescription will be filled at the same pharmacy."    REFERENCES: 1. DEA website announcement [email or web address]  2. Department of Justice website  [email or web address].pdf  3. DEPARTMENT OF JUSTICE Drug Enforcement Administration 21 CFR Part 1306 [Docket No. [id number]] RIN [id number] "Transfer of Electronic Prescriptions for Schedules II-V Controlled Substances Between Pharmacies for Initial  Filling"  ______________________________________________________________________       ______________________________________________________________________    Medication Rules  Purpose: To inform patients, and their family members, of our medication rules and regulations.  Applies to: All patients receiving prescriptions from our practice (written or electronic).  Pharmacy of record: This is the pharmacy where your electronic prescriptions will be sent. Make sure we have the correct one.  Electronic prescriptions: In compliance with the [facility] Strengthen Opioid Misuse Prevention (STOP) Act of 2017 (Session Conni Elliot [phone]), effective January 30, 2018, all controlled substances must be electronically prescribed. Written prescriptions, faxing, or calling prescriptions to a pharmacy will no longer be done.  Prescription refills: These will be provided only during in-person appointments. No medications will be renewed without a "face-to-face" evaluation with your provider. Applies to all prescriptions.  NOTE: The following applies primarily to controlled substances (Opioid* Pain Medications).   Type of encounter (visit): For patients receiving controlled substances, face-to-face visits are required. (Not an option and not up to the patient.)  Patient's responsibilities: Pain Pills: Bring all pain pills to every appointment (except for procedure appointments). Pill Bottles: Bring pills in original pharmacy bottle. Bring bottle, even if empty. Always bring the bottle of the most recent fill.  Medication refills: You are responsible for knowing and keeping track of what medications you are taking and when is it that you will need a refill. The day before your appointment: write a list of all prescriptions that need to be refilled. The day of the appointment: give the list to the admitting nurse. Prescriptions will  be written only during appointments. No prescriptions will be written  on procedure days. If you forget a medication: it will not be "Called in", "Faxed", or "electronically sent". You will need to get another appointment to get these prescribed. No early refills. Do not call asking to have your prescription filled early. Partial  or short prescriptions: Occasionally your pharmacy may not have enough pills to fill your prescription.  NEVER ACCEPT a partial fill or a prescription that is short of the total amount of pills that you were prescribed.  With controlled substances the law allows 72 hours for the pharmacy to complete the prescription.  If the prescription is not completed within 72 hours, the pharmacist will require a new prescription to be written. This means that you will be short on your medicine and we WILL NOT send another prescription to complete your original prescription.  Instead, request the pharmacy to send a carrier to a nearby branch to get enough medication to provide you with your full prescription. Prescription Accuracy: You are responsible for carefully inspecting your prescriptions before leaving our office. Have the discharge nurse carefully go over each prescription with you, before taking them home. Make sure that your name is accurately spelled, that your address is correct. Check the name and dose of your medication to make sure it is accurate. Check the number of pills, and the written instructions to make sure they are clear and accurate. Make sure that you are given enough medication to last until your next medication refill appointment. Taking Medication: Take medication as prescribed. When it comes to controlled substances, taking less pills or less frequently than prescribed is permitted and encouraged. Never take more pills than instructed. Never take the medication more frequently than prescribed.  Inform other Doctors: Always inform, all of your healthcare providers, of all the medications you take. Pain Medication from other Providers:  You are not allowed to accept any additional pain medication from any other Doctor or Healthcare provider. There are two exceptions to this rule. (see below) In the event that you require additional pain medication, you are responsible for notifying us, as stated below. Cough Medicine: Often these contain an opioid, such as codeine or hydrocodone. Never accept or take cough medicine containing these opioids if you are already taking an opioid* medication. The combination may cause respiratory failure and death. Medication Agreement: You are responsible for carefully reading and following our Medication Agreement. This must be signed before receiving any prescriptions from our practice. Safely store a copy of your signed Agreement. Violations to the Agreement will result in no further prescriptions. (Additional copies of our Medication Agreement are available upon request.) Laws, Rules, & Regulations: All patients are expected to follow all [address] and [employer], Rules, [employer]. Ignorance of the Laws does not constitute a valid excuse.  Illegal drugs and Controlled Substances: The use of illegal substances (including, but not limited to marijuana and its derivatives) and/or the illegal use of any controlled substances is strictly prohibited. Violation of this rule may result in the immediate and permanent discontinuation of any and all prescriptions being written by our practice. The use of any illegal substances is prohibited. Adopted CDC guidelines & recommendations: Target dosing levels will be at or below 60 MME/day. Use of benzodiazepines** is not recommended.  Exceptions: There are only two exceptions to the rule of not receiving pain medications from other Healthcare Providers. Exception #1 (Emergencies): In the event of an emergency (i.e.: accident requiring emergency  care), you are allowed to receive additional pain medication. However, you are responsible for: As soon as you are  able, call our office [phone], at any time of the day or night, and leave a message stating your name, the date and nature of the emergency, and the name and dose of the medication prescribed. In the event that your call is answered by a member of our staff, make sure to document and save the date, time, and the name of the person that took your information.  Exception #2 (Planned Surgery): In the event that you are scheduled by another doctor or dentist to have any type of surgery or procedure, you are allowed (for a period no longer than 30 days), to receive additional pain medication, for the acute post-op pain. However, in this case, you are responsible for picking up a copy of our "Post-op Pain Management for Surgeons" handout, and giving it to your surgeon or dentist. This document is available at our office, and does not require an appointment to obtain it. Simply go to our office during business hours (Monday-Thursday from 8:00 AM to 4:00 PM) (Friday 8:00 AM to 12:00 Noon) or if you have a scheduled appointment with [place], prior to your surgery, and ask for it by name. In addition, you are responsible for: calling our office [phone], at any time of the day or night, and leaving a message stating your name, name of your surgeon, type of surgery, and date of procedure or surgery. Failure to comply with your responsibilities may result in termination of therapy involving the controlled substances. Medication Agreement Violation. Following the above rules, including your responsibilities will help you in avoiding a Medication Agreement Violation ("Breaking your Pain Medication Contract").  Consequences:  Not following the above rules may result in permanent discontinuation of medication prescription therapy.  *Opioid medications include: morphine, codeine, oxycodone, oxymorphone, hydrocodone, hydromorphone, meperidine, tramadol, tapentadol, buprenorphine, fentanyl, methadone. **Benzodiazepine  medications include: diazepam (Valium), alprazolam (Xanax), clonazepam (Klonopine), lorazepam (Ativan), clorazepate (Tranxene), chlordiazepoxide (Librium), estazolam (Prosom), oxazepam (Serax), temazepam (Restoril), triazolam (Halcion) (Last updated: 11/22/2021) ______________________________________________________________________      ______________________________________________________________________    Medication Recommendations and Reminders  Applies to: All patients receiving prescriptions (written and/or electronic).  Medication Rules & Regulations: You are responsible for reading, knowing, and following our "Medication Rules" document. These exist for your safety and that of others. They are not flexible and neither are we. Dismissing or ignoring them is an act of "non-compliance" that may result in complete and irreversible termination of such medication therapy. For safety reasons, "non-compliance" will not be tolerated. As with the [place] fundamental legal principle of "ignorance of the law is no defense", we will accept no excuses for not having read and knowing the content of documents provided to you by our practice.  Pharmacy of record:  Definition: This is the pharmacy where your electronic prescriptions will be sent.  We do not endorse any particular pharmacy. It is up to you and your insurance to decide what pharmacy to use.  We do not restrict you in your choice of pharmacy. However, once we write for your prescriptions, we will NOT be re-sending more prescriptions to fix restricted supply problems created by your pharmacy, or your insurance.  The pharmacy listed in the electronic medical record should be the one where you want electronic prescriptions to be sent. If you choose to change pharmacy, simply notify our nursing staff. Changes will be made only during your regular appointments and  not over the phone.  Recommendations: Keep all of your pain medications in a safe  place, under lock and key, even if you live alone. We will NOT replace lost, stolen, or damaged medication. We do not accept "Police Reports" as proof of medications having been stolen. After you fill your prescription, take 1 week's worth of pills and put them away in a safe place. You should keep a separate, properly labeled bottle for this purpose. The remainder should be kept in the original bottle. Use this as your primary supply, until it runs out. Once it's gone, then you know that you have 1 week's worth of medicine, and it is time to come in for a prescription refill. If you do this correctly, it is unlikely that you will ever run out of medicine. To make sure that the above recommendation works, it is very important that you make sure your medication refill appointments are scheduled at least 1 week before you run out of medicine. To do this in an effective manner, make sure that you do not leave the office without scheduling your next medication management appointment. Always ask the nursing staff to show you in your prescription , when your medication will be running out. Then arrange for the receptionist to get you a return appointment, at least 7 days before you run out of medicine. Do not wait until you have 1 or 2 pills left, to come in. This is very poor planning and does not take into consideration that we may need to cancel appointments due to bad weather, sickness, or emergencies affecting our staff. DO NOT ACCEPT A "Partial Fill": If for any reason your pharmacy does not have enough pills/tablets to completely fill or refill your prescription, do not allow for a "partial fill". The law allows the pharmacy to complete that prescription within 72 hours, without requiring a new prescription. If they do not fill the rest of your prescription within those 72 hours, you will need a separate prescription to fill the remaining amount, which we will NOT provide. If the reason for the partial fill is  your insurance, you will need to talk to the pharmacist about payment alternatives for the remaining tablets, but again, DO NOT ACCEPT A PARTIAL FILL, unless you can trust your pharmacist to obtain the remainder of the pills within 72 hours.  Prescription refills and/or changes in medication(s):  Prescription refills, and/or changes in dose or medication, will be conducted only during scheduled medication management appointments. (Applies to both, written and electronic prescriptions.) No refills on procedure days. No medication will be changed or started on procedure days. No changes, adjustments, and/or refills will be conducted on a procedure day. Doing so will interfere with the diagnostic portion of the procedure. No phone refills. No medications will be "called into the pharmacy". No Fax refills. No weekend refills. No Holliday refills. No after hours refills.  Remember:  Business hours are:  Monday to Thursday 8:00 AM to 4:00 PM Provider's Schedule: Delano Metz, MD - Appointments are:  Medication management: Monday and Wednesday 8:00 AM to 4:00 PM Procedure day: Tuesday and Thursday 7:30 AM to 4:00 PM Edward Jolly, MD - Appointments are:  Medication management: Tuesday and Thursday 8:00 AM to 4:00 PM Procedure day: Monday and Wednesday 7:30 AM to 4:00 PM (Last update: 11/22/2021) ______________________________________________________________________      ______________________________________________________________________     Naloxone Nasal Spray  Why am I receiving this medication? [place] STOP ACT requires that all patients  taking high dose opioids or at risk of opioids respiratory depression, be prescribed an opioid reversal agent, such as Naloxone (AKA: Narcan).  What is this medication? NALOXONE (nal OX one) treats opioid overdose, which causes slow or shallow breathing, severe drowsiness, or trouble staying awake. Call emergency services after using this  medication. You may need additional treatment. Naloxone works by reversing the effects of opioids. It belongs to a group of medications called opioid blockers.  COMMON BRAND NAME(S): Kloxxado, Narcan  What should I tell my care team before I take this medication? They need to know if you have any of these conditions: Heart disease Substance use disorder An unusual or allergic reaction to naloxone, other medications, foods, dyes, or preservatives Pregnant or trying to get pregnant Breast-feeding  When to use this medication? This medication is to be used for the treatment of respiratory depression (less than 8 breaths per minute) secondary to opioid overdose.   How to use this medication? This medication is for use in the nose. Lay the person on their back. Support their neck with your hand and allow the head to tilt back before giving the medication. The nasal spray should be given into 1 nostril. After giving the medication, move the person onto their side. Do not remove or test the nasal spray until ready to use. Get emergency medical help right away after giving the first dose of this medication, even if the person wakes up. You should be familiar with how to recognize the signs and symptoms of a narcotic overdose. If more doses are needed, give the additional dose in the other nostril. Talk to your care team about the use of this medication in children. While this medication may be prescribed for children as young as newborns for selected conditions, precautions do apply.  Naloxone Overdosage: If you think you have taken too much of this medicine contact a poison control center or emergency room at once.  NOTE: This medicine is only for you. Do not share this medicine with others.  What if I miss a dose? This does not apply.  What may interact with this medication? This is only used during an emergency. No interactions are expected during emergency use. This list may not describe all  possible interactions. Give your health care provider a list of all the medicines, herbs, non-prescription drugs, or dietary supplements you use. Also tell them if you smoke, drink alcohol, or use illegal drugs. Some items may interact with your medicine.  What should I watch for while using this medication? Keep this medication ready for use in the case of an opioid overdose. Make sure that you have the phone number of your care team and local hospital ready. You may need to have additional doses of this medication. Each nasal spray contains a single dose. Some emergencies may require additional doses. After use, bring the treated person to the nearest hospital or call 911. Make sure the treating care team knows that the person has received a dose of this medication. You will receive additional instructions on what to do during and after use of this medication before an emergency occurs.  What side effects may I notice from receiving this medication? Side effects that you should report to your care team as soon as possible: Allergic reactions--skin rash, itching, hives, swelling of the face, lips, tongue, or throat Side effects that usually do not require medical attention (report these to your care team if they continue or are bothersome): Constipation Dryness  or irritation inside the nose Headache Increase in blood pressure Muscle spasms Stuffy nose Toothache This list may not describe all possible side effects. Call your doctor for medical advice about side effects. You may report side effects to FDA at 1-800-FDA-1088.  Where should I keep my medication? Because this is an emergency medication, you should keep it with you at all times.  Keep out of the reach of children and pets. Store between 20 and 25 degrees C (68 and 77 degrees F). Do not freeze. Throw away any unused medication after the expiration date. Keep in original box until ready to use.  NOTE: This sheet is a summary. It may  not cover all possible information. If you have questions about this medicine, talk to your doctor, pharmacist, or health care provider.   2023 Elsevier/Gold Standard (2020-09-24 00:00:00)  ______________________________________________________________________      ______________________________________________________________________    Patient Information update  To: All of our patients.  Re: Name change.  It has been made official that our current name, "[facility]"   will soon be changed to "[facility]".   The purpose of this change is to eliminate any confusion created by the concept of our practice being a "Medication Management Pain Clinic". In the past this has led to the misconception that we treat pain primarily by the use of prescription medications.  Nothing can be farther from the truth.   Understanding PAIN MANAGEMENT: To further understand what our practice does, you first have to understand that "Pain Management" is a subspecialty that requires additional training once a physician has completed their specialty training, which can be in either Anesthesia, Neurology, Psychiatry, or Physical Medicine and Rehabilitation (PMR). Each one of these contributes to the final approach taken by each physician to the management of their patient's pain. To be a "Pain Management Specialist" you must have first completed one of the specialty trainings below.  Anesthesiologists - trained in clinical pharmacology and interventional techniques such as nerve blockade and regional as well as central neuroanatomy. They are trained to block pain before, during, and after surgical interventions.  Neurologists - trained in the diagnosis and pharmacological treatment of complex neurological conditions, such as Multiple Sclerosis, Parkinson's, spinal cord injuries, and other systemic  conditions that may be associated with symptoms that may include but are not limited to pain. They tend to rely primarily on the treatment of chronic pain using prescription medications.  Psychiatrist - trained in conditions affecting the psychosocial wellbeing of patients including but not limited to depression, anxiety, schizophrenia, personality disorders, addiction, and other substance use disorders that may be associated with chronic pain. They tend to rely primarily on the treatment of chronic pain using prescription medications.   Physical Medicine and Rehabilitation (PMR) physicians, also known as physiatrists - trained to treat a wide variety of medical conditions affecting the brain, spinal cord, nerves, bones, joints, ligaments, muscles, and tendons. Their training is primarily aimed at treating patients that have suffered injuries that have caused severe physical impairment. Their training is primarily aimed at the physical therapy and rehabilitation of those patients. They may also work alongside orthopedic surgeons or neurosurgeons using their expertise in assisting surgical patients to recover after their surgeries.  INTERVENTIONAL PAIN MANAGEMENT is sub-subspecialty of Pain Management.  Our physicians are Board-certified in Anesthesia, Pain Management, and Interventional Pain Management.  This meaning that not only have they been trained and Board-certified in  their specialty of Anesthesia, and subspecialty of Pain Management, but they have also received further training in the sub-subspecialty of Interventional Pain Management, in order to become Board-certified as INTERVENTIONAL PAIN MANAGEMENT SPECIALIST.    Mission: Our goal is to use our skills in  INTERVENTIONAL PAIN MANAGEMENT as alternatives to the chronic use of prescription opioid medications for the treatment of pain. To make this more clear, we have changed our name to reflect what we do and offer. We will continue to offer  medication management assessment and recommendations, but we will not be taking over any patient's medication management.  ______________________________________________________________________

## 2022-10-18 NOTE — Progress Notes (Signed)
PROVIDER NOTE: Information contained herein reflects review and annotations entered in association with encounter. Interpretation of such information and data should be left to medically-trained personnel. Information provided to patient can be located elsewhere in the medical record under "Patient Instructions". Document created using STT-dictation technology, any transcriptional errors that may result from process are unintentional.    Patient: Tracy Mann  Service Category: E/M  Provider: Oswaldo Done, MD  DOB: 10-30-1933  DOS: 10/18/2022  Referring Provider: Bosie Clos, MD  MRN: [id number]  Specialty: Interventional Pain Management  PCP: Bosie Clos, MD  Type: Established Patient  Setting: Ambulatory outpatient    Location: Office  Delivery: Face-to-face     HPI  [name], a 89 y.o. year old female, is here today because of her Chronic pain syndrome [G89.4]. [name] primary complain today is Leg Pain (right)  Pertinent problems: [name] has Cervical muscle strain; DDD (degenerative disc disease), lumbosacral; Chronic venous insufficiency; Chronic pain syndrome; Abnormal MRI, lumbar spine (08/22/2019); Lumbar facet hypertrophy (Multilevel) (Bilateral); Lumbar central spinal stenosis (SEVERE) (L3-4) with neurogenic claudication; Grade 1 Anterolisthesis of lumbar spine (L4/L5); Lumbar facet arthropathy (L3-4 right-sided facet edema/joint effusion); Lumbar lateral recess stenosis (L4-5) (Right); Chronic low back pain (3ry area of Pain) (Bilateral) w/ sciatica (Bilateral); Lumbosacral radiculopathy at L5 (Bilateral); Chronic lower extremity pain (1ry area of Pain) (Bilateral); Lumbosacral facet syndrome; Chronic hip pain (2ry area of Pain) (Bilateral) (R>L); Osteoarthritis involving multiple joints; Osteoarthritis of knee (Right); Osteoarthritis of facet joint of lumbar spine; Osteoarthritis of lumbar spine; Bilateral lower extremity edema; Cellulitis of right lower leg;  Chronic knee pain (Bilateral); Peripheral vascular disease (HCC) (lower extremity) (Bilateral); Cervicalgia; Chronic upper back pain; and Lumbosacral spondylosis with radiculopathy on their pertinent problem list. Pain Assessment: Severity of Chronic pain is reported as a 5 /10. Location: Leg Right/pain travel down her right leg. Onset: More than a month ago. Quality: Aching, Burning, Constant, Throbbing, Radiating, Stabbing, Tightness. Timing: Constant. Modifying factor(s): Meds and laying down. Vitals:  height is 5\' 3"  (1.6 m) and weight is 204 lb (92.5 kg). Her temperature is 97.1 F (36.2 C) (abnormal). Her blood pressure is 133/94 (abnormal) and her pulse is 94. Her oxygen saturation is 96%.  BMI: Estimated body mass index is 36.14 kg/m as calculated from the following:   Height as of this encounter: 5\' 3"  (1.6 m).   Weight as of this encounter: 204 lb (92.5 kg). Last encounter: 07/19/2022. Last procedure: Visit date not found.  Reason for encounter: medication management.  The patient indicates doing well with the current medication regimen. No adverse reactions or side effects reported to the medications.   RTCB: 01/21/2023   Pharmacotherapy Assessment  Analgesic: Hydrocodone/APAP 10/325, (see the 05/09/2021 note) using 1.675 pills/day. MME/day: 10-20 mg/day   Monitoring: [place] PMP: PDMP reviewed during this encounter.       Pharmacotherapy: No side-effects or adverse reactions reported. Compliance: No problems identified. Effectiveness: Clinically acceptable.  Brigitte Pulse, RN  10/18/2022  1:50 PM  Sign when Signing Visit Nursing Pain Medication Assessment:  Safety precautions to be maintained throughout the outpatient stay will include: orient to surroundings, keep bed in low position, maintain call bell within reach at all times, provide assistance with transfer out of bed and ambulation.  Medication Inspection Compliance: Pill count conducted under aseptic conditions, in front of  the patient. Neither the pills nor the bottle was removed from the patient's sight at any time. Once count was completed  pills were immediately returned to the patient in their original bottle.  Medication: Hydrocodone/APAP Pill/Patch Count:  19 1/2 of 60 pills remain Pill/Patch Appearance: Markings consistent with prescribed medication Bottle Appearance: Standard pharmacy container. Clearly labeled. Filled Date: 8 / 24 / 2024 Last Medication intake:  TodaySafety precautions to be maintained throughout the outpatient stay will include: orient to surroundings, keep bed in low position, maintain call bell within reach at all times, provide assistance with transfer out of bed and ambulation.     No results found for: "CBDTHCR" No results found for: "D8THCCBX" No results found for: "D9THCCBX"  UDS:  Summary  Date Value Ref Range Status  07/19/2022 Note  Final    Comment:    ==================================================================== [employer] (MW) ==================================================================== Test                             Result       Flag       Units  Drug Present and Declared for Prescription Verification   Lorazepam                      713          EXPECTED   ng/mg creat    Source of lorazepam is a scheduled prescription medication.    Hydrocodone                    1207         EXPECTED   ng/mg creat   Hydromorphone                  83           EXPECTED   ng/mg creat   Dihydrocodeine                 293          EXPECTED   ng/mg creat   Norhydrocodone                 902          EXPECTED   ng/mg creat    Sources of hydrocodone include scheduled prescription medications.    Hydromorphone, dihydrocodeine and norhydrocodone are expected    metabolites of hydrocodone. Hydromorphone and dihydrocodeine are    also available as scheduled prescription medications.  ==================================================================== Test                       Result    Flag   Units      Ref Range   Creatinine              89               mg/dL      >=40 ==================================================================== Declared Medications:  The flagging and interpretation on this report are based on the  following declared medications.  Unexpected results may arise from  inaccuracies in the declared medications.   **Note: The testing scope of this panel includes these medications:   Hydrocodone (Norco)  Lorazepam (Ativan)   **Note: The testing scope of this panel does not include the  following reported medications:   Acetaminophen (Norco)  Albuterol (Ventolin HFA)  Apixaban (Eliquis)  Doxycycline  Ezetimibe (Zetia)  Flavoxate (Urispas)  Fluticasone (Flonase)  Insulin (Lantus)  Levothyroxine (Synthroid)  Lisinopril (Zestril)  Magnesium  Metolazone (Zaroxolyn)  Naloxone (Narcan)  Nitrofurantoin (Macrodantin)  Nitroglycerin (Nitrostat)  Ondansetron (Zofran)  Pantoprazole (Protonix)  Phenazopyridine (Pyridium)  Polyethylene Glycol (GlycoLax)  Ropinirole (Requip)  Torsemide (Demadex) ==================================================================== For clinical consultation, please call [phone]. ====================================================================       ROS  Constitutional: Denies any fever or chills Gastrointestinal: No reported hemesis, hematochezia, vomiting, or acute GI distress Musculoskeletal: Denies any acute onset joint swelling, redness, loss of ROM, or weakness Neurological: No reported episodes of acute onset apraxia, aphasia, dysarthria, agnosia, amnesia, paralysis, loss of coordination, or loss of consciousness  Medication Review  HYDROcodone-acetaminophen, Insulin Pen Needle, LORazepam, Magnesium, NONFORMULARY OR COMPOUNDED ITEM, albuterol, apixaban, ezetimibe, flavoxATE, fluticasone, insulin glargine, insulin lispro, levothyroxine, lisinopril, metolazone,  naloxone, nitroGLYCERIN, nitrofurantoin, ondansetron, pantoprazole, phenazopyridine, polyethylene glycol powder, rOPINIRole, and torsemide  History Review  Allergy: Ms. Cansler is allergic to duloxetine, paroxetine, sulfa antibiotics, cephalexin, clarithromycin, diphenhydramine, estrogens conjugated, estrogens conjugated, and nitrofurantoin. Drug: Ms. Graper  reports no history of drug use. Alcohol:  reports no history of alcohol use. Tobacco:  reports that she has never smoked. She has never used smokeless tobacco. Social: Ms. Penilla  reports that she has never smoked. She has never used smokeless tobacco. She reports that she does not drink alcohol and does not use drugs. Medical:  has a past medical history of Acute myocardial infarction of inferior wall (HCC), Arthritis, Chronic anticoagulation, Chronic atrial fibrillation (HCC), Chronic combined systolic and diastolic CHF (congestive heart failure) (HCC), Essential hypertension, Hyperlipidemia, mixed, Mixed Ischemic/Nonischemic Cardiomyopathy, Nonobstructive coronary artery disease, Obesity, Thyroid disease, Type II diabetes mellitus (HCC), Venous insufficiency, and Vitamin D deficiency. Surgical: Ms. Doto  has a past surgical history that includes Vesicovaginal fistula closure w/ TAH (1996); Tubal ligation (1968); Cholecystectomy (1999); and Cardiac catheterization (11/2012). Family: family history includes Heart disease in her mother; Thyroid disease in her father.  Laboratory Chemistry Profile   Renal Lab Results  Component Value Date   BUN 31 (H) 03/15/2022   CREATININE 1.32 (H) 03/15/2022   BCR 23 03/15/2022   GFRAA 73 12/31/2019   GFRNONAA 36 (L) 09/19/2021    Hepatic Lab Results  Component Value Date   AST 20 03/15/2022   ALT 10 03/15/2022   ALBUMIN 4.1 03/15/2022   ALKPHOS 114 03/15/2022    Electrolytes Lab Results  Component Value Date   NA 141 03/15/2022   K 4.7 03/15/2022   CL 101 03/15/2022   CALCIUM 9.2 03/15/2022   MG  2.1 11/26/2019   PHOS 3.9 09/08/2014    Bone Lab Results  Component Value Date   25OHVITD1 43 11/26/2019   25OHVITD2 9.2 11/26/2019   25OHVITD3 34 11/26/2019    Inflammation (CRP: Acute Phase) (ESR: Chronic Phase) Lab Results  Component Value Date   CRP 4 11/26/2019   ESRSEDRATE 45 (H) 11/26/2019   LATICACIDVEN 1.4 01/09/2020         Note: Above Lab results reviewed.  Recent Imaging Review  US RENAL CLINICAL DATA:  Right flank pain.  EXAM: RENAL / URINARY TRACT ULTRASOUND COMPLETE  COMPARISON:  Renal ultrasound April 11, 2019 and Jun 03, 2021. CT of the abdomen May 05, 2019.  FINDINGS: Right Kidney:  Renal measurements: 9.3 x 3.5 x 4.5 cm = volume: 78 mL. Echogenicity within normal limits. No mass or hydronephrosis visualized.  Left Kidney:  Renal measurements: 10.5 x 4.3 x 4.9 cm = volume: 117 mL. A subtle masslike region is again identified in the midpole measuring 2.9 x 2.3 x 2.3 cm today. A similar mass like region was identified in this area on  the April 11, 2019 ultrasound measuring 3.2 x 2.4 x 2.5 cm at that time. A CT scan dated May 05, 2019 demonstrated no CT correlate. The finding was again identified on the Jun 03, 2021 comparison measuring 2.4 x 1.7 by 2.1 cm. The difference in measurement was probably technical.  Bladder:  Appears normal for degree of bladder distention.  Other:  None.  IMPRESSION: 1. A subtle masslike region is again identified in the midpole of the left kidney measuring 2.9 x 2.3 x 2.3 cm today. A similar masslike region was identified in this area on the April 11, 2019 ultrasound measuring 3.2 x 2.4 x 2.5 cm at that time. A CT scan dated May 05, 2019 demonstrated no CT correlate. Given 3 years of sonographic stability and the lack of a mass on the comparison CT scan, the finding could represent a prominent column of Bertin. MRI could definitively assess this region if clinically warranted. 2. No other  abnormalities.  Electronically Signed   By: Gerome Sam III M.D.   On: 05/09/2022 18:52 Note: Reviewed        Physical Exam  General appearance: Well nourished, well developed, and well hydrated. In no apparent acute distress Mental status: Alert, oriented x 3 (person, place, & time)       Respiratory: No evidence of acute respiratory distress Eyes: PERLA Vitals: BP (!) 133/94   Pulse 94   Temp (!) 97.1 F (36.2 C)   Ht 5\' 3"  (1.6 m)   Wt 204 lb (92.5 kg)   SpO2 96%   BMI 36.14 kg/m  BMI: Estimated body mass index is 36.14 kg/m as calculated from the following:   Height as of this encounter: 5\' 3"  (1.6 m).   Weight as of this encounter: 204 lb (92.5 kg). Ideal: Ideal body weight: 52.4 kg (115 lb 8.3 oz) Adjusted ideal body weight: 68.5 kg (150 lb 14.6 oz)  Assessment   Diagnosis Status  1. Chronic pain syndrome   2. Chronic lower extremity pain (1ry area of Pain) (Bilateral)   3. Chronic hip pain (2ry area of Pain) (Bilateral) (R>L)   4. Chronic low back pain (3ry area of Pain) (Bilateral) w/ sciatica (Bilateral)   5. Lumbosacral facet syndrome   6. Chronic upper back pain   7. Chronic knee pain (Bilateral)   8. Pharmacologic therapy   9. Chronic use of opiate for therapeutic purpose   10. Encounter for medication management   11. Encounter for chronic pain management    Controlled Controlled Controlled   Updated Problems: No problems updated.  Plan of Care  Problem-specific:  No problem-specific Assessment & Plan notes found for this encounter.  Ms. KENYOTA LIZAK has a current medication list which includes the following long-term medication(s): albuterol, apixaban, ezetimibe, fluticasone, [START ON 10/23/2022] hydrocodone-acetaminophen, [START ON 11/22/2022] hydrocodone-acetaminophen, [START ON 12/22/2022] hydrocodone-acetaminophen, insulin lispro, levothyroxine, lisinopril, metolazone, nitroglycerin, ropinirole, and torsemide.  Pharmacotherapy  (Medications Ordered): Meds ordered this encounter  Medications   HYDROcodone-acetaminophen (NORCO) 10-325 MG tablet    Sig: Take 1 tablet by mouth every 8 (eight) hours as needed for severe pain. Must last 30 days.    Dispense:  60 tablet    Refill:  0    DO NOT: delete (not duplicate); no partial-fill (will deny script to complete), no refill request (F/U required). DISPENSE: 1 day early if closed on fill date. WARN: No CNS-depressants within 8 hrs of med.   HYDROcodone-acetaminophen (NORCO) 10-325 MG tablet    Sig:  Take 1 tablet by mouth every 8 (eight) hours as needed for severe pain. Must last 30 days.    Dispense:  60 tablet    Refill:  0    DO NOT: delete (not duplicate); no partial-fill (will deny script to complete), no refill request (F/U required). DISPENSE: 1 day early if closed on fill date. WARN: No CNS-depressants within 8 hrs of med.   HYDROcodone-acetaminophen (NORCO) 10-325 MG tablet    Sig: Take 1 tablet by mouth every 8 (eight) hours as needed for severe pain. Must last 30 days.    Dispense:  60 tablet    Refill:  0    DO NOT: delete (not duplicate); no partial-fill (will deny script to complete), no refill request (F/U required). DISPENSE: 1 day early if closed on fill date. WARN: No CNS-depressants within 8 hrs of med.   naloxone (NARCAN) nasal spray 4 mg/0.1 mL    Sig: Place 1 spray into the nose as needed for up to 365 doses (for opioid-induced respiratory depresssion). In case of emergency (overdose), spray once into each nostril. If no response within 3 minutes, repeat application and call 911.    Dispense:  1 each    Refill:  0    Instruct patient in proper use of device.   Orders:  No orders of the defined types were placed in this encounter.  Follow-up plan:   Return in about 3 months (around 01/21/2023) for Eval-day (M,W), (F2F), (MM).      Interventional Therapies  Risk  Complexity Considerations:   NOTE: COUMADIN Anticoagulation (Stop: 5 days   Restart: 2 hours) Decreased GFR Obstructive sleep apnea   Planned  Pending:      Under consideration:   Diagnostic midline caudal ESI #1 + diagnostic epidurogram  Diagnostic bilateral IA hip joint injection #1  Diagnostic right L3-4 LESI #1  Diagnostic right L4 TFESI #1  Diagnostic bilateral L3 TFESI #1  Diagnostic bilateral lumbar facet MBB #1    Completed:   Diagnostic/therapeutic midline L4-5 LESI x2 (08/04/2021) (100/100/100/100)    Completed by Dr. Mariah Milling [facility]):   Diagnostic bilateral L4 TFESI x1 (09/12/2019 - Dr. Mariah Milling [[facility]])    Therapeutic  Palliative (PRN) options:   Diagnostic/therapeutic midline L4-5 LESI          Recent Visits No visits were found meeting these conditions. Showing recent visits within past 90 days and meeting all other requirements Today's Visits Date Type Provider Dept  10/18/22 Office Visit Delano Metz, MD [facility]  Showing today's visits and meeting all other requirements Future Appointments No visits were found meeting these conditions. Showing future appointments within next 90 days and meeting all other requirements  I discussed the assessment and treatment plan with the patient. The patient was provided an opportunity to ask questions and all were answered. The patient agreed with the plan and demonstrated an understanding of the instructions.  Patient advised to call back or seek an in-person evaluation if the symptoms or condition worsens.  Duration of encounter: 30 minutes.  Total time on encounter, as per AMA guidelines included both the face-to-face and non-face-to-face time personally spent by the physician and/or other qualified health care professional(s) on the day of the encounter (includes time in activities that require the physician or other qualified health care professional and does not include time in activities normally performed by clinical staff). Physician's time may include the following  activities when performed: Preparing to see the patient (e.g., pre-charting review of records,  searching for previously ordered imaging, lab work, and nerve conduction tests) Review of prior analgesic pharmacotherapies. Reviewing PMP Interpreting ordered tests (e.g., lab work, imaging, nerve conduction tests) Performing post-procedure evaluations, including interpretation of diagnostic procedures Obtaining and/or reviewing separately obtained history Performing a medically appropriate examination and/or evaluation Counseling and educating the patient/family/caregiver Ordering medications, tests, or procedures Referring and communicating with other health care professionals (when not separately reported) Documenting clinical information in the electronic or other health record Independently interpreting results (not separately reported) and communicating results to the patient/ family/caregiver Care coordination (not separately reported)  Note by: Oswaldo Done, MD Date: 10/18/2022; Time: 2:05 PM

## 2022-10-18 NOTE — Progress Notes (Signed)
Nursing Pain Medication Assessment:  Safety precautions to be maintained throughout the outpatient stay will include: orient to surroundings, keep bed in low position, maintain call bell within reach at all times, provide assistance with transfer out of bed and ambulation.  Medication Inspection Compliance: Pill count conducted under aseptic conditions, in front of the patient. Neither the pills nor the bottle was removed from the patient's sight at any time. Once count was completed pills were immediately returned to the patient in their original bottle.  Medication: Hydrocodone/APAP Pill/Patch Count:  19 1/2 of 60 pills remain Pill/Patch Appearance: Markings consistent with prescribed medication Bottle Appearance: Standard pharmacy container. Clearly labeled. Filled Date: 8 / 24 / 2024 Last Medication intake:  TodaySafety precautions to be maintained throughout the outpatient stay will include: orient to surroundings, keep bed in low position, maintain call bell within reach at all times, provide assistance with transfer out of bed and ambulation.

## 2022-10-23 ENCOUNTER — Ambulatory Visit: Payer: Medicare Other | Admitting: Urology

## 2022-10-30 ENCOUNTER — Inpatient Hospital Stay
Admission: EM | Admit: 2022-10-30 | Discharge: 2022-11-01 | DRG: 603 | Disposition: A | Discharge status: Home / Self Care | Payer: Medicare Other | Source: Ambulatory Visit | Attending: Internal Medicine | Admitting: Internal Medicine

## 2022-10-30 ENCOUNTER — Other Ambulatory Visit: Payer: Self-pay

## 2022-10-30 DIAGNOSIS — E559 Vitamin D deficiency, unspecified: Secondary | ICD-10-CM | POA: Diagnosis present

## 2022-10-30 DIAGNOSIS — E039 Hypothyroidism, unspecified: Secondary | ICD-10-CM | POA: Diagnosis present

## 2022-10-30 DIAGNOSIS — I428 Other cardiomyopathies: Secondary | ICD-10-CM | POA: Diagnosis present

## 2022-10-30 DIAGNOSIS — L0100 Impetigo, unspecified: Secondary | ICD-10-CM | POA: Diagnosis present

## 2022-10-30 DIAGNOSIS — K219 Gastro-esophageal reflux disease without esophagitis: Secondary | ICD-10-CM | POA: Diagnosis present

## 2022-10-30 DIAGNOSIS — E782 Mixed hyperlipidemia: Secondary | ICD-10-CM | POA: Diagnosis present

## 2022-10-30 DIAGNOSIS — F411 Generalized anxiety disorder: Secondary | ICD-10-CM | POA: Diagnosis present

## 2022-10-30 DIAGNOSIS — L03113 Cellulitis of right upper limb: Secondary | ICD-10-CM | POA: Diagnosis present

## 2022-10-30 DIAGNOSIS — Z79891 Long term (current) use of opiate analgesic: Secondary | ICD-10-CM | POA: Diagnosis not present

## 2022-10-30 DIAGNOSIS — Z8349 Family history of other endocrine, nutritional and metabolic diseases: Secondary | ICD-10-CM

## 2022-10-30 DIAGNOSIS — I10 Essential (primary) hypertension: Secondary | ICD-10-CM | POA: Insufficient documentation

## 2022-10-30 DIAGNOSIS — Z79890 Hormone replacement therapy (postmenopausal): Secondary | ICD-10-CM | POA: Diagnosis not present

## 2022-10-30 DIAGNOSIS — I5042 Chronic combined systolic (congestive) and diastolic (congestive) heart failure: Secondary | ICD-10-CM | POA: Diagnosis present

## 2022-10-30 DIAGNOSIS — G4733 Obstructive sleep apnea (adult) (pediatric): Secondary | ICD-10-CM | POA: Diagnosis present

## 2022-10-30 DIAGNOSIS — Z79899 Other long term (current) drug therapy: Secondary | ICD-10-CM

## 2022-10-30 DIAGNOSIS — E1122 Type 2 diabetes mellitus with diabetic chronic kidney disease: Secondary | ICD-10-CM | POA: Diagnosis present

## 2022-10-30 DIAGNOSIS — E872 Acidosis, unspecified: Secondary | ICD-10-CM | POA: Diagnosis present

## 2022-10-30 DIAGNOSIS — G2581 Restless legs syndrome: Secondary | ICD-10-CM | POA: Diagnosis present

## 2022-10-30 DIAGNOSIS — L03115 Cellulitis of right lower limb: Secondary | ICD-10-CM | POA: Diagnosis present

## 2022-10-30 DIAGNOSIS — G8929 Other chronic pain: Secondary | ICD-10-CM | POA: Diagnosis present

## 2022-10-30 DIAGNOSIS — I130 Hypertensive heart and chronic kidney disease with heart failure and stage 1 through stage 4 chronic kidney disease, or unspecified chronic kidney disease: Secondary | ICD-10-CM | POA: Diagnosis present

## 2022-10-30 DIAGNOSIS — Z7901 Long term (current) use of anticoagulants: Secondary | ICD-10-CM | POA: Diagnosis not present

## 2022-10-30 DIAGNOSIS — N1832 Chronic kidney disease, stage 3b: Secondary | ICD-10-CM | POA: Diagnosis present

## 2022-10-30 DIAGNOSIS — E119 Type 2 diabetes mellitus without complications: Secondary | ICD-10-CM

## 2022-10-30 DIAGNOSIS — Z8249 Family history of ischemic heart disease and other diseases of the circulatory system: Secondary | ICD-10-CM

## 2022-10-30 DIAGNOSIS — M79604 Pain in right leg: Secondary | ICD-10-CM | POA: Diagnosis present

## 2022-10-30 DIAGNOSIS — I482 Chronic atrial fibrillation, unspecified: Secondary | ICD-10-CM | POA: Diagnosis present

## 2022-10-30 DIAGNOSIS — I2510 Atherosclerotic heart disease of native coronary artery without angina pectoris: Secondary | ICD-10-CM | POA: Diagnosis present

## 2022-10-30 DIAGNOSIS — E785 Hyperlipidemia, unspecified: Secondary | ICD-10-CM | POA: Diagnosis present

## 2022-10-30 DIAGNOSIS — I255 Ischemic cardiomyopathy: Secondary | ICD-10-CM | POA: Diagnosis present

## 2022-10-30 DIAGNOSIS — Z9049 Acquired absence of other specified parts of digestive tract: Secondary | ICD-10-CM

## 2022-10-30 DIAGNOSIS — Z794 Long term (current) use of insulin: Secondary | ICD-10-CM

## 2022-10-30 DIAGNOSIS — I119 Hypertensive heart disease without heart failure: Secondary | ICD-10-CM | POA: Diagnosis present

## 2022-10-30 DIAGNOSIS — I252 Old myocardial infarction: Secondary | ICD-10-CM

## 2022-10-30 DIAGNOSIS — I4891 Unspecified atrial fibrillation: Secondary | ICD-10-CM | POA: Diagnosis present

## 2022-10-30 DIAGNOSIS — Z90710 Acquired absence of both cervix and uterus: Secondary | ICD-10-CM

## 2022-10-30 LAB — CBC
HCT: 36.3 % (ref 36.0–46.0)
Hemoglobin: 11.6 g/dL — ABNORMAL LOW (ref 12.0–15.0)
MCH: 30.9 pg (ref 26.0–34.0)
MCHC: 32 g/dL (ref 30.0–36.0)
MCV: 96.5 fL (ref 80.0–100.0)
Platelets: 221 10*3/uL (ref 150–400)
RBC: 3.76 MIL/uL — ABNORMAL LOW (ref 3.87–5.11)
RDW: 12.9 % (ref 11.5–15.5)
WBC: 10.5 10*3/uL (ref 4.0–10.5)
nRBC: 0 % (ref 0.0–0.2)

## 2022-10-30 LAB — HEPATIC FUNCTION PANEL
ALT: 11 U/L (ref 0–44)
AST: 22 U/L (ref 15–41)
Albumin: 3.4 g/dL — ABNORMAL LOW (ref 3.5–5.0)
Alkaline Phosphatase: 84 U/L (ref 38–126)
Bilirubin, Direct: 0.1 mg/dL (ref 0.0–0.2)
Indirect Bilirubin: 0.6 mg/dL (ref 0.3–0.9)
Total Bilirubin: 0.7 mg/dL (ref 0.3–1.2)
Total Protein: 7 g/dL (ref 6.5–8.1)

## 2022-10-30 LAB — BASIC METABOLIC PANEL
Anion gap: 10 (ref 5–15)
BUN: 43 mg/dL — ABNORMAL HIGH (ref 8–23)
CO2: 30 mmol/L (ref 22–32)
Calcium: 8.7 mg/dL — ABNORMAL LOW (ref 8.9–10.3)
Chloride: 95 mmol/L — ABNORMAL LOW (ref 98–111)
Creatinine, Ser: 1.56 mg/dL — ABNORMAL HIGH (ref 0.44–1.00)
GFR, Estimated: 32 mL/min — ABNORMAL LOW (ref >60)
Glucose, Bld: 179 mg/dL — ABNORMAL HIGH (ref 70–99)
Potassium: 4.2 mmol/L (ref 3.5–5.1)
Sodium: 135 mmol/L (ref 135–145)

## 2022-10-30 LAB — LACTIC ACID, PLASMA
Lactic Acid, Venous: 0.9 mmol/L (ref 0.5–1.9)
Lactic Acid, Venous: 2.4 mmol/L (ref 0.5–1.9)

## 2022-10-30 LAB — GLUCOSE, CAPILLARY: Glucose-Capillary: 148 mg/dL — ABNORMAL HIGH (ref 70–99)

## 2022-10-30 MED ORDER — SODIUM CHLORIDE 0.9 % IV SOLN
100.0000 mg | Freq: Two times a day (BID) | INTRAVENOUS | Status: DC
  Administered 2022-10-30 – 2022-10-31 (×3): 100 mg via INTRAVENOUS
  Filled 2022-10-30 (×4): qty 100

## 2022-10-30 MED ORDER — EZETIMIBE 10 MG PO TABS
10.0000 mg | ORAL_TABLET | Freq: Every day | ORAL | Status: DC
  Administered 2022-10-31 (×2): 10 mg via ORAL
  Filled 2022-10-30 (×3): qty 1

## 2022-10-30 MED ORDER — HYDRALAZINE HCL 20 MG/ML IJ SOLN: 5.0000 mg | Freq: Four times a day (QID) | INTRAMUSCULAR | Status: DC | PRN

## 2022-10-30 MED ORDER — ACETAMINOPHEN 650 MG RE SUPP: 650.0000 mg | Freq: Four times a day (QID) | RECTAL | Status: DC | PRN

## 2022-10-30 MED ORDER — ALBUTEROL SULFATE (2.5 MG/3ML) 0.083% IN NEBU
2.5000 mg | INHALATION_SOLUTION | Freq: Four times a day (QID) | RESPIRATORY_TRACT | Status: DC | PRN
  Filled 2022-10-30: qty 3

## 2022-10-30 MED ORDER — FLAVOXATE HCL 100 MG PO TABS
100.0000 mg | ORAL_TABLET | Freq: Three times a day (TID) | ORAL | Status: DC | PRN
  Filled 2022-10-30: qty 1

## 2022-10-30 MED ORDER — ALBUTEROL SULFATE HFA 108 (90 BASE) MCG/ACT IN AERS: 2.0000 | INHALATION_SPRAY | Freq: Four times a day (QID) | RESPIRATORY_TRACT | Status: DC | PRN

## 2022-10-30 MED ORDER — METOPROLOL TARTRATE 5 MG/5ML IV SOLN: 2.5000 mg | INTRAVENOUS | Status: DC | PRN

## 2022-10-30 MED ORDER — ONDANSETRON HCL 4 MG PO TABS: 4.0000 mg | ORAL_TABLET | Freq: Four times a day (QID) | ORAL | Status: DC | PRN

## 2022-10-30 MED ORDER — LORAZEPAM 0.5 MG PO TABS
0.5000 mg | ORAL_TABLET | Freq: Two times a day (BID) | ORAL | Status: DC
  Administered 2022-10-31 – 2022-11-01 (×3): 0.5 mg via ORAL
  Filled 2022-10-30 (×3): qty 1

## 2022-10-30 MED ORDER — ACETAMINOPHEN 325 MG PO TABS
650.0000 mg | ORAL_TABLET | Freq: Four times a day (QID) | ORAL | Status: DC | PRN
  Administered 2022-10-30 – 2022-11-01 (×5): 650 mg via ORAL
  Filled 2022-10-30 (×5): qty 2

## 2022-10-30 MED ORDER — PANTOPRAZOLE SODIUM 40 MG PO TBEC: 40.0000 mg | DELAYED_RELEASE_TABLET | ORAL | Status: DC | PRN

## 2022-10-30 MED ORDER — LISINOPRIL 10 MG PO TABS: 10.0000 mg | ORAL_TABLET | Freq: Every day | ORAL | Status: DC

## 2022-10-30 MED ORDER — INSULIN GLARGINE-YFGN 100 UNIT/ML ~~LOC~~ SOLN
20.0000 [IU] | Freq: Every day | SUBCUTANEOUS | Status: DC
  Administered 2022-10-31: 20 [IU] via SUBCUTANEOUS
  Filled 2022-10-30 (×2): qty 0.2

## 2022-10-30 MED ORDER — SENNOSIDES-DOCUSATE SODIUM 8.6-50 MG PO TABS: 1.0000 | ORAL_TABLET | Freq: Every evening | ORAL | Status: DC | PRN

## 2022-10-30 MED ORDER — METOPROLOL TARTRATE 5 MG/5ML IV SOLN: 5.0000 mg | Freq: Four times a day (QID) | INTRAVENOUS | Status: DC | PRN

## 2022-10-30 MED ORDER — INSULIN ASPART 100 UNIT/ML IJ SOLN
0.0000 [IU] | Freq: Three times a day (TID) | INTRAMUSCULAR | Status: DC
  Administered 2022-11-01: 2 [IU] via SUBCUTANEOUS
  Filled 2022-10-30 (×2): qty 1

## 2022-10-30 MED ORDER — ONDANSETRON HCL 4 MG/2ML IJ SOLN: 4.0000 mg | Freq: Four times a day (QID) | INTRAMUSCULAR | Status: DC | PRN

## 2022-10-30 MED ORDER — LEVOTHYROXINE SODIUM 50 MCG PO TABS
25.0000 ug | ORAL_TABLET | Freq: Every day | ORAL | Status: DC
  Administered 2022-10-31 – 2022-11-01 (×2): 25 ug via ORAL
  Filled 2022-10-30 (×2): qty 1

## 2022-10-30 MED ORDER — VANCOMYCIN HCL 2000 MG/400ML IV SOLN
2000.0000 mg | Freq: Once | INTRAVENOUS | Status: AC
  Administered 2022-10-30: 2000 mg via INTRAVENOUS
  Filled 2022-10-30: qty 400

## 2022-10-30 MED ORDER — MELATONIN 5 MG PO TABS: 5.0000 mg | ORAL_TABLET | Freq: Every evening | ORAL | Status: DC | PRN

## 2022-10-30 MED ORDER — APIXABAN 5 MG PO TABS
5.0000 mg | ORAL_TABLET | Freq: Two times a day (BID) | ORAL | Status: DC
  Administered 2022-10-31 – 2022-11-01 (×3): 5 mg via ORAL
  Filled 2022-10-30 (×3): qty 1

## 2022-10-30 MED ORDER — MUPIROCIN CALCIUM 2 % EX CREA
TOPICAL_CREAM | Freq: Three times a day (TID) | CUTANEOUS | Status: DC
  Filled 2022-10-30: qty 15

## 2022-10-30 MED ORDER — HYDROCODONE-ACETAMINOPHEN 10-325 MG PO TABS
1.0000 | ORAL_TABLET | Freq: Three times a day (TID) | ORAL | Status: DC | PRN
  Administered 2022-10-30 – 2022-11-01 (×4): 1 via ORAL
  Filled 2022-10-30 (×4): qty 1

## 2022-10-30 MED ORDER — MUPIROCIN CALCIUM 2 % EX CREA
TOPICAL_CREAM | Freq: Once | CUTANEOUS | Status: AC
  Filled 2022-10-30: qty 15

## 2022-10-30 MED ORDER — HEPARIN SODIUM (PORCINE) 5000 UNIT/ML IJ SOLN: 5000.0000 [IU] | Freq: Three times a day (TID) | INTRAMUSCULAR | Status: DC

## 2022-10-30 MED ORDER — TORSEMIDE 20 MG PO TABS
40.0000 mg | ORAL_TABLET | Freq: Every day | ORAL | Status: DC
  Administered 2022-10-31 – 2022-11-01 (×2): 40 mg via ORAL
  Filled 2022-10-30 (×2): qty 2

## 2022-10-30 MED ORDER — LACTATED RINGERS IV BOLUS
1000.0000 mL | Freq: Once | INTRAVENOUS | Status: AC
  Administered 2022-10-30: 1000 mL via INTRAVENOUS

## 2022-10-30 MED ORDER — ROPINIROLE HCL 0.25 MG PO TABS: 0.2500 mg | ORAL_TABLET | Freq: Every day | ORAL | Status: DC

## 2022-10-30 MED ORDER — FLUTICASONE PROPIONATE 50 MCG/ACT NA SUSP: 2.0000 | Freq: Every day | NASAL | Status: DC | PRN

## 2022-10-30 MED ORDER — VANCOMYCIN HCL IN DEXTROSE 1-5 GM/200ML-% IV SOLN: 1000.0000 mg | INTRAVENOUS | Status: DC

## 2022-10-30 MED ORDER — MAGNESIUM OXIDE -MG SUPPLEMENT 400 (240 MG) MG PO TABS
800.0000 mg | ORAL_TABLET | Freq: Every day | ORAL | Status: DC
  Administered 2022-10-31 – 2022-11-01 (×2): 800 mg via ORAL
  Filled 2022-10-30 (×2): qty 2

## 2022-10-30 MED ORDER — INSULIN ASPART 100 UNIT/ML IJ SOLN: 0.0000 [IU] | Freq: Every day | INTRAMUSCULAR | Status: DC

## 2022-10-30 NOTE — Assessment & Plan Note (Signed)
Home Ativan 1 mg p.o. twice daily resumed

## 2022-10-30 NOTE — Assessment & Plan Note (Signed)
Metoprolol tartrate 2.5 mg IV every 4 hours as needed for heart rate greater than 115, 3 doses ordered Eliquis 5 mg p.o. twice daily resumed

## 2022-10-30 NOTE — ED Triage Notes (Signed)
Pt to ED for wound to right hand and right leg, dx with cellulitis by PCP. Was referred to wound clinic but did not have openings so sent here.  NAD noted Pt diabetic

## 2022-10-30 NOTE — Consult Note (Signed)
PHARMACY -  BRIEF ANTIBIOTIC NOTE   Pharmacy has received consult(s) for vancomycin from an ED provider.  The patient's profile has been reviewed for ht/wt/allergies/indication/available labs.    One time order(s) placed for  Vancomycin 2000 mg  Further antibiotics/pharmacy consults should be ordered by admitting physician if indicated.                       Thank you, Sharen Hones, PharmD, BCPS Clinical Pharmacist   10/30/2022  6:49 PM

## 2022-10-30 NOTE — Assessment & Plan Note (Signed)
-   PPI resumed °

## 2022-10-30 NOTE — ED Provider Notes (Signed)
[facility] Provider Note    Event Date/Time   First MD Initiated Contact with Patient 10/30/22 1801     (approximate)   History   Cellulitis   HPI  Tracy Mann is a 89 y.o. female with past medical history of A-fib on anticoagulation, hypertension, CHF, diabetes, here with right leg and right hand pain and rash.  The patient states that her symptoms started approximately week ago with superficial ulceration and wound to her right lower leg.  She then had extending redness throughout her lower leg.  She then developed a similar rash with some excoriations and mild pruritus in her right hand.  This is now spread and she has had several areas of ulceration and exposed skin.  She has had moderate pain here as well.  She has had redness that is now streaking up her forearm.  Denies new trauma.  No new medications.  No known exposures.  No other complaints.     Physical Exam   Triage Vital Signs: ED Triage Vitals  Encounter Vitals Group     BP 10/30/22 1506 (!) 117/90     Systolic BP Percentile --      Diastolic BP Percentile --      Pulse Rate 10/30/22 1507 88     Resp 10/30/22 1506 20     Temp 10/30/22 1507 98.3 F (36.8 C)     Temp src --      SpO2 10/30/22 1507 93 %     Weight 10/30/22 1507 202 lb (91.6 kg)     Height 10/30/22 1507 5\' 3"  (1.6 m)     Head Circumference --      Peak Flow --      Pain Score 10/30/22 1507 6     Pain Loc --      Pain Education --      Exclude from Growth Chart --     Most recent vital signs: Vitals:   10/30/22 2159 10/31/22 0001  BP: (!) 142/65 (!) 124/45  Pulse: 81 72  Resp: (!) 24 18  Temp: 98.1 F (36.7 C) 98.4 F (36.9 C)  SpO2: 91% 93%     General: Awake, no distress.  CV:  Good peripheral perfusion.  Regular rate and rhythm. Resp:  Normal work of breathing.  Lungs clear to auscultation bilaterally. Abd:  No distention.  No tenderness.  No guarding rebound. Other:  Multiple superficial  ulcerations, across the dorsum of the right hand with surrounding diffuse skin erythema and induration.  Superficial ulceration to the right lower anterior leg with erythema extending up to the upper lower leg just distal to the knee.  Moderate tenderness to palpation.  No fluctuance.   ED Results / Procedures / Treatments   Labs (all labs ordered are listed, but only abnormal results are displayed) Labs Reviewed  CBC - Abnormal; Notable for the following components:      Result Value   RBC 3.76 (*)    Hemoglobin 11.6 (*)    All other components within normal limits  BASIC METABOLIC PANEL - Abnormal; Notable for the following components:   Chloride 95 (*)    Glucose, Bld 179 (*)    BUN 43 (*)    Creatinine, Ser 1.56 (*)    Calcium 8.7 (*)    GFR, Estimated 32 (*)    All other components within normal limits  LACTIC ACID, PLASMA - Abnormal; Notable for the following components:   Lactic Acid, Venous  2.4 (*)    All other components within normal limits  HEPATIC FUNCTION PANEL - Abnormal; Notable for the following components:   Albumin 3.4 (*)    All other components within normal limits  GLUCOSE, CAPILLARY - Abnormal; Notable for the following components:   Glucose-Capillary 148 (*)    All other components within normal limits  CULTURE, BLOOD (ROUTINE X 2)  CULTURE, BLOOD (ROUTINE X 2)  LACTIC ACID, PLASMA  LACTIC ACID, PLASMA  BASIC METABOLIC PANEL  CBC  HEMOGLOBIN A1C     EKG    RADIOLOGY    I also independently reviewed and agree with radiologist interpretations.   PROCEDURES:  Critical Care performed: No   MEDICATIONS ORDERED IN ED: Medications  levothyroxine (SYNTHROID) tablet 25 mcg (has no administration in time range)  doxycycline (VIBRAMYCIN) 100 mg in sodium chloride 0.9 % 250 mL IVPB (100 mg Intravenous New Bag/Given 10/30/22 2310)  senna-docusate (Senokot-S) tablet 1 tablet (has no administration in time range)  ondansetron (ZOFRAN) tablet 4 mg  (has no administration in time range)    Or  ondansetron (ZOFRAN) injection 4 mg (has no administration in time range)  acetaminophen (TYLENOL) tablet 650 mg (650 mg Oral Given 10/30/22 2252)    Or  acetaminophen (TYLENOL) suppository 650 mg ( Rectal See Alternative 10/30/22 2252)  mupirocin cream (BACTROBAN) 2 % ( Topical Given 10/30/22 2313)  melatonin tablet 5 mg (has no administration in time range)  hydrALAZINE (APRESOLINE) injection 5 mg (has no administration in time range)  insulin aspart (novoLOG) injection 0-9 Units (has no administration in time range)  insulin aspart (novoLOG) injection 0-5 Units ( Subcutaneous Not Given 10/30/22 2257)  HYDROcodone-acetaminophen (NORCO) 10-325 MG per tablet 1 tablet (1 tablet Oral Given 10/30/22 2252)  apixaban (ELIQUIS) tablet 5 mg (5 mg Oral Not Given 10/30/22 2311)  vancomycin (VANCOCIN) IVPB 1000 mg/200 mL premix (has no administration in time range)  albuterol (PROVENTIL) (2.5 MG/3ML) 0.083% nebulizer solution 2.5 mg (has no administration in time range)  ezetimibe (ZETIA) tablet 10 mg (has no administration in time range)  LORazepam (ATIVAN) tablet 0.5 mg (has no administration in time range)  insulin glargine-yfgn (SEMGLEE) injection 20 Units (has no administration in time range)  pantoprazole (PROTONIX) EC tablet 40 mg (has no administration in time range)  flavoxATE (URISPAS) tablet 100 mg (has no administration in time range)  Magnesium CAPS 1,000 mg (has no administration in time range)  fluticasone (FLONASE) 50 MCG/ACT nasal spray 2 spray (has no administration in time range)  torsemide (DEMADEX) tablet 40 mg (has no administration in time range)  metoprolol tartrate (LOPRESSOR) injection 2.5 mg (has no administration in time range)  0.9 %  sodium chloride infusion ( Intravenous New Bag/Given 10/31/22 0024)  rOPINIRole (REQUIP) tablet 0.25 mg (has no administration in time range)  lactated ringers bolus 1,000 mL (0 mLs Intravenous Stopped  10/30/22 2033)  mupirocin cream (BACTROBAN) 2 % ( Topical Given 10/30/22 1923)  vancomycin (VANCOREADY) IVPB 2000 mg/400 mL (0 mg Intravenous Stopped 10/30/22 2200)  sodium chloride 0.9 % bolus 1,000 mL (1,000 mLs Intravenous New Bag/Given 10/31/22 0026)     IMPRESSION / MDM / ASSESSMENT AND PLAN / ED COURSE  I reviewed the triage vital signs and the nursing notes.                              Differential diagnosis includes, but is not limited to, cellulitis, impetigo, bullous pemphigus,  dermatitis with secondary skin breakdown from itching and superimposed a lightest  Patient's presentation is most consistent with acute presentation with potential threat to life or bodily function.  The patient is on the cardiac monitor to evaluate for evidence of arrhythmia and/or significant heart rate changes   89 yo F here with wounds to R hand, RLE. Exam is concerning for acute cellulitis, particularly of RLE. Hand is more c/w likely impetigo with secondary excoriations but is also c/f early infection. Pt has no leukocytosis. She is HDS. LA normal. Bun/Cr ratio slightly elevated, suggesting dehydration. Given the extent of cellulitis and wounds, will admit for abx and observation. No signs of SJS/TEN clinically.    FINAL CLINICAL IMPRESSION(S) / ED DIAGNOSES   Final diagnoses:  Cellulitis of right leg  Cellulitis of right hand     Rx / DC Orders   ED Discharge Orders     None        Note:  This document was prepared using Dragon voice recognition software and may include unintentional dictation errors.   Shaune Pollack, MD 10/31/22 [phone]

## 2022-10-30 NOTE — Assessment & Plan Note (Signed)
PDMP reviewed.

## 2022-10-30 NOTE — Assessment & Plan Note (Addendum)
Insulin-dependent diabetes mellitus 2 Patient takes insulin glargine 20 units nightly, this has been resumed Insulin SSI with at bedtime coverage ordered

## 2022-10-30 NOTE — Assessment & Plan Note (Addendum)
Possible sepsis with elevated lactic acid on repeat Time to repeat of lactic acid at 00:24 on evening of admission Home clindamycin not resumed on admission due to patient reaction Will cover for MRSA as patient has insulin-dependent diabetes mellitus Vancomycin per pharmacy; doxycycline 100 mg twice daily, 7 days ordered Blood cultures x 2 are in process Scheduled repeat lactic acid for 0024 10/31/22

## 2022-10-30 NOTE — Plan of Care (Signed)

## 2022-10-30 NOTE — Assessment & Plan Note (Signed)
-   Does not appear to be in acute exacerbation

## 2022-10-30 NOTE — Assessment & Plan Note (Signed)
  At the right hand and risk, suspect streptococcal Mupirocin cream 2% 3 times daily, 5 days ordered

## 2022-10-30 NOTE — H&P (Signed)
History and Physical   Tracy Mann MWN:[id number] DOB: 07-Apr-1933 DOA: 10/30/2022  PCP: Bosie Clos, MD  Outpatient Specialists: Dr. Mariah Milling Patient coming from: Home  I have personally briefly reviewed patient's old medical records in [facility] EMR.  Chief Concern: Right hand rash  HPI: [name] is an 89 year old female with history of insulin-dependent diabetes mellitus, hypertension, chronic pain, anxiety, atrial fibrillation on Eliquis, restless leg syndrome, hypothyroid, who presents to the emergency department for chief concerns of right leg and right hand rash.  Vitals in the ED showed temperature of 98.3, on repeat was 98.2, heart rate of 88, respiration rate of 20, blood pressure 117/90, SpO2 93% on room air.  Serum sodium is 135, potassium 4.2, chloride 95, bicarb 30, BUN of 43, serum creatinine 1.56, EGFR 32, nonfasting blood glucose 179, WBC 10.5, hemoglobin 11.6, platelets of 221.  Lactic acid is 0.9.  Blood cultures x 2 have been ordered and pending collection.  ED treatment: LR 1 L bolus.  Vancomycin per pharmacy. ----------------------------------- At bedside, patient was able to tell me her name, age, location, current calendar year.  Patient reports her symptoms started about 1 week ago with superficial laceration or ulceration of her right lower leg.  She reports the redness started to extend.  She was giving antibiotic and has been taking it for about 2 days.  She felt like every time she took the antibiotic she developed wheezing, shortness of breath and swelling.  She developed stomach upset.  The medication was clindamycin.  She stopped taking the medication.  She then developed right hand swelling and redness that was itchy.  She scratched it with a damp towel and developed excoriation and rash.  She denies fever.  She denies nausea, vomiting, chest pain, shortness of breath.  Social history: She lives on her own.  She denies tobacco, EtOH,  recreational drug use.  She is retired and formally with a [employer].  ROS: Constitutional: no weight change, no fever ENT/Mouth: no sore throat, no rhinorrhea Eyes: no eye pain, no vision changes Cardiovascular: no chest pain, no dyspnea,  no edema, no palpitations Respiratory: no cough, no sputum, no wheezing Gastrointestinal: no nausea, no vomiting, no diarrhea, no constipation Genitourinary: no urinary incontinence, no dysuria, no hematuria Musculoskeletal: no arthralgias, no myalgias Skin: no skin lesions, no pruritus, Neuro: + weakness, no loss of consciousness, no syncope Psych: no anxiety, no depression, + decrease appetite Heme/Lymph: no bruising, no bleeding  ED Course: Gust with emergency medicine provider, patient requiring hospitalization for chief concerns of cellulitis failed outpatient therapy from reaction.  Assessment/Plan  Principal Problem:   Cellulitis of right leg Active Problems:   Long-term current use of benzodiazepine   Long term current use of opiate analgesic   Anxiety, generalized   Acid reflux   Obstructive sleep apnea   Diabetes mellitus (HCC)   Atrial fibrillation (HCC)   Benign hypertensive heart disease without congestive heart failure   Impetigo   Assessment and Plan:  * Cellulitis of right leg Possible sepsis with elevated lactic acid on repeat Time to repeat of lactic acid at 00:24 on evening of admission Home clindamycin not resumed on admission due to patient reaction Will cover for MRSA as patient has insulin-dependent diabetes mellitus Vancomycin per pharmacy; doxycycline 100 mg twice daily, 7 days ordered Blood cultures x 2 are in process Scheduled repeat lactic acid for 0024 10/31/22  Long term current use of opiate analgesic PDMP reviewed  Long-term current use  of benzodiazepine PDMP reviewed  Impetigo At the right hand and risk, suspect streptococcal Mupirocin cream 2% 3 times daily, 5 days ordered  Benign  hypertensive heart disease without congestive heart failure Does not appear to be in acute exacerbation  Atrial fibrillation (HCC) Metoprolol tartrate 2.5 mg IV every 4 hours as needed for heart rate greater than 115, 3 doses ordered Eliquis 5 mg p.o. twice daily resumed  Diabetes mellitus (HCC) Insulin-dependent diabetes mellitus 2 Patient takes insulin glargine 20 units nightly, this has been resumed Insulin SSI with at bedtime coverage ordered  Acid reflux PPI resumed  Anxiety, generalized Home Ativan 1 mg p.o. twice daily resumed  Chart reviewed.   DVT prophylaxis: Apixaban 5 mg p.o. twice daily Code Status: Full code Diet: Heart healthy/carb modified Family Communication: Sons are aware of patient being admitted to the hospital Disposition Plan: Pending clinical course Consults called: None at this time Admission status: MedSurg, inpatient  Past Medical History:  Diagnosis Date   Acute myocardial infarction of inferior wall (HCC)    a. 10/2005 - Presumed to be 2/2 to a coronary embolus in setting of persistent Afib-->Cath 2014 relatively nl cors, EF 45-50% w/ severe distal inferior/apical inferior HK w/ aneursymal appearance.   Arthritis    Chronic anticoagulation    a. warfarin.   Chronic atrial fibrillation (HCC)    a. CHA2DS2VASc = 6--> warfarin.   Chronic combined systolic and diastolic CHF (congestive heart failure) (HCC)    a. 10/2012 Echo: EF 35-40%, diff HK, mild MR, mild biatrial enlargement;  b. 11/2012 EF 45-50% by LV gram; c. echo 07/2014: EF 40-45%, mod conc LVH, mod MR, severe biatrial enlargement, PASP 45 mm Hg c. Echo 01/2018 EF 45-50%, duffuse hypokinesis, mild MR, LA mod dilated, norm RVSF, dilated IVC   Essential hypertension    Hyperlipidemia, mixed    Mixed Ischemic/Nonischemic Cardiomyopathy    a. 10/2012 Echo: EF 35-40%;  b. 11/2012 EF 45-50% by LV gram.   Nonobstructive coronary artery disease    a. 2007 inferior wall MI thought to be secondary  to thrombus from atrial fib b. 2014 cath with R dominant coronary arterial system with mild luminal irregularities c. Myoview 05/2015 old inferior MI, no new ischemic changes   Obesity    Thyroid disease    hypothyroidism   Type II diabetes mellitus (HCC)    a. 07/2018 A1c 8.7 - long term insulin use   Venous insufficiency    Vitamin D deficiency    Past Surgical History:  Procedure Laterality Date   CARDIAC CATHETERIZATION  11/2012   [facility];EF 45-50%   CHOLECYSTECTOMY  1999   TUBAL LIGATION  1968   VESICOVAGINAL FISTULA CLOSURE W/ TAH  1996   Social History:  reports that she has never smoked. She has never used smokeless tobacco. She reports that she does not drink alcohol and does not use drugs.  Allergies  Allergen Reactions   Clindamycin/Lincomycin Shortness Of Breath and Swelling   Duloxetine Other (See Comments)    Same as Paroxetine   Paroxetine Other (See Comments)    "Horrible headache," Stomach pain, Diarrhea   Sulfa Antibiotics Anaphylaxis   Cephalexin Other (See Comments)    Blisters   Clarithromycin Other (See Comments)    Hives, headaches, hard time swelling, felt like throat was closing up  Hives, headaches, hard time swelling, felt like throat was closing up    Hives, headaches, hard time swelling, felt like throat was closing up Hives, headaches, hard time swelling,  felt like throat was closing up   Diphenhydramine Other (See Comments)   Estrogens Conjugated    Estrogens Conjugated    Nitrofurantoin Nausea Only   Family History  Problem Relation Age of Onset   Heart disease Mother    Thyroid disease Father    Family history: Family history reviewed and not pertinent.  Prior to Admission medications   Medication Sig Start Date End Date Taking? Authorizing Provider  albuterol (VENTOLIN HFA) 108 (90 Base) MCG/ACT inhaler TAKE 2 PUFFS BY MOUTH EVERY 6 HOURS AS NEEDED FOR WHEEZE OR SHORTNESS OF BREATH 03/19/20   Bosie Clos, MD  apixaban (ELIQUIS) 5 MG  TABS tablet Take 1 tablet (5 mg total) by mouth 2 (two) times daily. 03/20/22   Antonieta Iba, MD  ezetimibe (ZETIA) 10 MG tablet Take 1 tablet (10 mg total) by mouth daily. 07/26/21   Bosie Clos, MD  flavoxATE (URISPAS) 100 MG tablet Take 1 tablet (100 mg total) by mouth 2 (two) times daily as needed for bladder spasms. 10/17/21   MacDiarmid, Lorin Picket, MD  fluticasone (FLONASE) 50 MCG/ACT nasal spray Place 2 sprays into both nostrils daily. 01/06/22   Alfredia Ferguson, PA-C  HYDROcodone-acetaminophen (NORCO) 10-325 MG tablet Take 1 tablet by mouth every 8 (eight) hours as needed for severe pain. Must last 30 days. 10/23/22 11/22/22  Delano Metz, MD  HYDROcodone-acetaminophen (NORCO) 10-325 MG tablet Take 1 tablet by mouth every 8 (eight) hours as needed for severe pain. Must last 30 days. 11/22/22 12/22/22  Delano Metz, MD  HYDROcodone-acetaminophen (NORCO) 10-325 MG tablet Take 1 tablet by mouth every 8 (eight) hours as needed for severe pain. Must last 30 days. 12/22/22 01/21/23  Delano Metz, MD  insulin glargine (LANTUS SOLOSTAR) 100 UNIT/ML Solostar Pen Inject 24 Units into the skin at bedtime. 05/20/19   [provider]  insulin lispro (HUMALOG) 100 UNIT/ML KwikPen Inject 10-25 Units into the skin in the morning, at noon, and at bedtime. 10/12/20   [provider]  Insulin Pen Needle (RELION PEN NEEDLE 31G/8MM) 31G X 8 MM MISC Use with insulin pen three times daily 03/24/19   Bosie Clos, MD  levothyroxine (SYNTHROID) 25 MCG tablet Take 25 mcg by mouth daily before breakfast.    [provider]  lisinopril (ZESTRIL) 10 MG tablet Take 1 tablet (10 mg total) by mouth daily. 06/20/22   Antonieta Iba, MD  LORazepam (ATIVAN) 1 MG tablet TAKE 1 TABLET(1 MG) BY MOUTH TWICE DAILY AS NEEDED 10/06/21   Bosie Clos, MD  Magnesium 500 MG CAPS Take 1,000 mg by mouth daily.     [provider]  metolazone (ZAROXOLYN) 2.5 MG tablet Take 1  tablet (2.5 mg total) by mouth as directed. Take two times a week with torsemide 40 mg 06/20/22   Antonieta Iba, MD  naloxone [facility]) nasal spray 4 mg/0.1 mL Place 1 spray into the nose as needed for up to 365 doses (for opioid-induced respiratory depresssion). In case of emergency (overdose), spray once into each nostril. If no response within 3 minutes, repeat application and call 911. 10/18/22 10/18/23  Delano Metz, MD  nitrofurantoin (MACRODANTIN) 50 MG capsule Take 50 mg by mouth at bedtime. 04/25/22 04/25/23  [provider]  nitroGLYCERIN (NITROSTAT) 0.4 MG SL tablet Place 1 tablet (0.4 mg total) under the tongue every 5 (five) minutes as needed for chest pain. 01/04/21   Antonieta Iba, MD  NONFORMULARY OR COMPOUNDED ITEM See pharmacy note- Not  to use on any open skin or wound. 10/06/21   Bosie Clos, MD  ondansetron (ZOFRAN) 4 MG tablet Take 1 tablet (4 mg total) by mouth every 12 (twelve) hours as needed for nausea or vomiting. 07/26/21   Bosie Clos, MD  pantoprazole (PROTONIX) 40 MG tablet Take 40 mg by mouth as needed.    [provider]  phenazopyridine (PYRIDIUM) 95 MG tablet Take by mouth 3 (three) times daily as needed.    [provider]  polyethylene glycol powder (GLYCOLAX/MIRALAX) 17 GM/SCOOP powder Take 255 g by mouth daily. 01/10/22   Toney Reil, MD  rOPINIRole (REQUIP) 0.25 MG tablet TAKE 1 TABLET BY MOUTH EVERYDAY AT BEDTIME 07/26/21   Bosie Clos, MD  torsemide (DEMADEX) 20 MG tablet Take 2 tablets (40 mg total) by mouth daily. 06/20/22   Antonieta Iba, MD    Physical Exam: Vitals:   10/30/22 1507 10/30/22 2016 10/30/22 2159 10/31/22 0001  BP:   (!) 142/65 (!) 124/45  Pulse: 88  81 72  Resp:   (!) 24 18  Temp: 98.3 F (36.8 C) 98.2 F (36.8 C) 98.1 F (36.7 C) 98.4 F (36.9 C)  TempSrc:  Oral    SpO2: 93%  91% 93%  Weight: 91.6 kg     Height: 5\' 3"  (1.6 m)      Constitutional: appears  age-appropriate, NAD , calm Eyes: PERRL, lids and conjunctivae normal ENMT: Mucous membranes are moist. Posterior pharynx clear of any exudate or lesions. Age-appropriate dentition. Hearing appropriate Neck: normal, supple, no masses, no thyromegaly Respiratory: clear to auscultation bilaterally, no wheezing, no crackles. Normal respiratory effort. No accessory muscle use.  Cardiovascular: Regular rate and rhythm, no murmurs / rubs / gallops. No extremity edema. 2+ pedal pulses. No carotid bruits.  Abdomen: no tenderness, no masses palpated, no hepatosplenomegaly. Bowel sounds positive.  Musculoskeletal: no clubbing / cyanosis. No joint deformity upper and lower extremities. Good ROM, no contractures, no atrophy. Normal muscle tone.  Skin: Right anterior leg with increased warmth and swelling consistent with cellulitis.  Currently has dressing in place that appears clean.  Neurologic: Sensation intact. Strength 5/5 in all 4.  Psychiatric: Normal judgment and insight. Alert and oriented x 3. Normal mood.   EKG: Not indicated at this time  Chest x-ray on Admission: Not indicated at this time  Labs on Admission: I have personally reviewed following labs  CBC: Recent Labs  Lab 10/30/22 1509  WBC 10.5  HGB 11.6*  HCT 36.3  MCV 96.5  PLT 221   Basic Metabolic Panel: Recent Labs  Lab 10/30/22 1509  NA 135  K 4.2  CL 95*  CO2 30  GLUCOSE 179*  BUN 43*  CREATININE 1.56*  CALCIUM 8.7*   GFR: Estimated Creatinine Clearance: 27.3 mL/min (A) (by C-G formula based on SCr of 1.56 mg/dL (H)).  Liver Function Tests: Recent Labs  Lab 10/30/22 2232  AST 22  ALT 11  ALKPHOS 84  BILITOT 0.7  PROT 7.0  ALBUMIN 3.4*   CBG: Recent Labs  Lab 10/30/22 2257  GLUCAP 148*   Urine analysis:    Component Value Date/Time   APPEARANCEUR Clear 05/12/2021 0953   GLUCOSEU Negative 05/12/2021 0953   BILIRUBINUR Negative 10/13/2021 1329   BILIRUBINUR Negative 05/12/2021 0953    PROTEINUR Negative 10/13/2021 1329   PROTEINUR Negative 05/12/2021 0953   UROBILINOGEN 0.2 10/13/2021 1329   NITRITE Negative 10/13/2021 1329   NITRITE Negative 05/12/2021 0953   LEUKOCYTESUR  Moderate (2+) (A) 10/13/2021 1329   LEUKOCYTESUR 1+ (A) 05/12/2021 0953   This document was prepared using Dragon Voice Recognition software and may include unintentional dictation errors.  Dr. Sedalia Muta [facility]  If 7PM-7AM, please contact overnight-coverage provider If 7AM-7PM, please contact day attending provider [email or web address]  10/31/2022, 12:02 AM

## 2022-10-30 NOTE — Progress Notes (Signed)
Pharmacy Antibiotic Note  Tracy Mann is a 89 y.o. female admitted on 10/30/2022 with cellulitis. PMH significant for CKD III, HTN, T2DM, and afib (Eliquis PTA). Patient presenting with worsening pain, rash and erythema to right lower extremity approximately 1 week after sustaining a superficial ulceration. In ED, patient is afebrile with no leukocytosis. Pharmacy has been consulted for vancomycin dosing.  Plan: Vancomycin 2000 mg IV load x1 given in ED Start vancomycin 1000 mg IV every 48 hours (eAUC 499.8, Css min 12.0, Scr 1.56, Vd 0.5 L/kg) Monitor renal function, clinical status, culture data and LOT  Height: 5\' 3"  (160 cm) Weight: 91.6 kg (202 lb) IBW/kg (Calculated) : 52.4  Temp (24hrs), Avg:98.3 F (36.8 C), Min:98.2 F (36.8 C), Max:98.3 F (36.8 C)  Recent Labs  Lab 10/30/22 1509 10/30/22 1848  WBC 10.5  --   CREATININE 1.56*  --   LATICACIDVEN  --  0.9    Estimated Creatinine Clearance: 27.3 mL/min (A) (by C-G formula based on SCr of 1.56 mg/dL (H)).    Allergies  Allergen Reactions   Clindamycin/Lincomycin Shortness Of Breath and Swelling   Duloxetine Other (See Comments)    Same as Paroxetine   Paroxetine Other (See Comments)    "Horrible headache," Stomach pain, Diarrhea   Sulfa Antibiotics Anaphylaxis   Cephalexin Other (See Comments)    Blisters   Clarithromycin Other (See Comments)    Hives, headaches, hard time swelling, felt like throat was closing up  Hives, headaches, hard time swelling, felt like throat was closing up    Hives, headaches, hard time swelling, felt like throat was closing up Hives, headaches, hard time swelling, felt like throat was closing up   Diphenhydramine Other (See Comments)   Estrogens Conjugated    Estrogens Conjugated    Nitrofurantoin Nausea Only   Antimicrobials this admission: vancomycin 9/30 >>   Dose adjustments this admission: N/A  Microbiology results: 9/30 BCx: pending  Thank you for involving pharmacy  in this patient's care.   Rockwell Alexandria, PharmD Clinical Pharmacist 10/30/2022 9:03 PM

## 2022-10-30 NOTE — Hospital Course (Signed)
Tracy Mann is an 89 year old female with history of insulin-dependent diabetes mellitus, hypertension, chronic pain, anxiety, atrial fibrillation on Eliquis, restless leg syndrome, hypothyroid, who presents to the emergency department for chief concerns of right leg and right hand rash.  Vitals in the ED showed temperature of 98.3, on repeat was 98.2, heart rate of 88, respiration rate of 20, blood pressure 117/90, SpO2 93% on room air.  Serum sodium is 135, potassium 4.2, chloride 95, bicarb 30, BUN of 43, serum creatinine 1.56, EGFR 32, nonfasting blood glucose 179, WBC 10.5, hemoglobin 11.6, platelets of 221.  Lactic acid is 0.9.  Blood cultures x 2 have been ordered and pending collection.  ED treatment: LR 1 L bolus.  Vancomycin per pharmacy.

## 2022-10-31 ENCOUNTER — Encounter: Payer: Self-pay | Admitting: Internal Medicine

## 2022-10-31 DIAGNOSIS — Z79899 Other long term (current) drug therapy: Secondary | ICD-10-CM

## 2022-10-31 DIAGNOSIS — Z79891 Long term (current) use of opiate analgesic: Secondary | ICD-10-CM

## 2022-10-31 DIAGNOSIS — L03115 Cellulitis of right lower limb: Secondary | ICD-10-CM | POA: Diagnosis not present

## 2022-10-31 DIAGNOSIS — N1832 Chronic kidney disease, stage 3b: Secondary | ICD-10-CM | POA: Insufficient documentation

## 2022-10-31 DIAGNOSIS — F411 Generalized anxiety disorder: Secondary | ICD-10-CM | POA: Diagnosis not present

## 2022-10-31 DIAGNOSIS — I10 Essential (primary) hypertension: Secondary | ICD-10-CM | POA: Insufficient documentation

## 2022-10-31 LAB — HEMOGLOBIN A1C
Hgb A1c MFr Bld: 6.7 % — ABNORMAL HIGH (ref 4.8–5.6)
Mean Plasma Glucose: 145.59 mg/dL

## 2022-10-31 LAB — BASIC METABOLIC PANEL
Anion gap: 6 (ref 5–15)
BUN: 35 mg/dL — ABNORMAL HIGH (ref 8–23)
CO2: 28 mmol/L (ref 22–32)
Calcium: 8.2 mg/dL — ABNORMAL LOW (ref 8.9–10.3)
Chloride: 103 mmol/L (ref 98–111)
Creatinine, Ser: 1.2 mg/dL — ABNORMAL HIGH (ref 0.44–1.00)
GFR, Estimated: 44 mL/min — ABNORMAL LOW (ref >60)
Glucose, Bld: 130 mg/dL — ABNORMAL HIGH (ref 70–99)
Potassium: 4.6 mmol/L (ref 3.5–5.1)
Sodium: 137 mmol/L (ref 135–145)

## 2022-10-31 LAB — CBC
HCT: 33.6 % — ABNORMAL LOW (ref 36.0–46.0)
Hemoglobin: 10.6 g/dL — ABNORMAL LOW (ref 12.0–15.0)
MCH: 30.7 pg (ref 26.0–34.0)
MCHC: 31.5 g/dL (ref 30.0–36.0)
MCV: 97.4 fL (ref 80.0–100.0)
Platelets: 197 10*3/uL (ref 150–400)
RBC: 3.45 MIL/uL — ABNORMAL LOW (ref 3.87–5.11)
RDW: 13 % (ref 11.5–15.5)
WBC: 8.9 10*3/uL (ref 4.0–10.5)
nRBC: 0 % (ref 0.0–0.2)

## 2022-10-31 LAB — GLUCOSE, CAPILLARY
Glucose-Capillary: 104 mg/dL — ABNORMAL HIGH (ref 70–99)
Glucose-Capillary: 129 mg/dL — ABNORMAL HIGH (ref 70–99)
Glucose-Capillary: 119 mg/dL — ABNORMAL HIGH (ref 70–99)
Glucose-Capillary: 155 mg/dL — ABNORMAL HIGH (ref 70–99)

## 2022-10-31 LAB — LACTIC ACID, PLASMA: Lactic Acid, Venous: 1.4 mmol/L (ref 0.5–1.9)

## 2022-10-31 MED ORDER — SODIUM CHLORIDE 0.9 % IV BOLUS
1000.0000 mL | Freq: Once | INTRAVENOUS | Status: AC
  Administered 2022-10-31: 1000 mL via INTRAVENOUS

## 2022-10-31 MED ORDER — SODIUM CHLORIDE 0.9 % IV SOLN: INTRAVENOUS | Status: AC

## 2022-10-31 MED ORDER — BUSPIRONE HCL 10 MG PO TABS
5.0000 mg | ORAL_TABLET | Freq: Two times a day (BID) | ORAL | Status: DC
  Filled 2022-10-31 (×2): qty 1

## 2022-10-31 MED ORDER — ROPINIROLE HCL 0.25 MG PO TABS: 0.2500 mg | ORAL_TABLET | Freq: Every evening | ORAL | Status: DC | PRN

## 2022-10-31 MED ORDER — CALCIUM CARBONATE ANTACID 500 MG PO CHEW
1.0000 | CHEWABLE_TABLET | Freq: Three times a day (TID) | ORAL | Status: DC | PRN
  Administered 2022-10-31: 200 mg via ORAL
  Filled 2022-10-31: qty 1

## 2022-10-31 NOTE — Plan of Care (Signed)
  Problem: Coping: Goal: Ability to adjust to condition or change in health will improve Outcome: Progressing   Problem: Skin Integrity: Goal: Risk for impaired skin integrity will decrease Outcome: Progressing   Problem: Activity: Goal: Risk for activity intolerance will decrease Outcome: Progressing   Problem: Nutrition: Goal: Adequate nutrition will be maintained Outcome: Progressing

## 2022-10-31 NOTE — Progress Notes (Signed)
Pt. had an episode of coughing, whilst eating breakfast. MD notified by Eboni,RN.

## 2022-10-31 NOTE — Assessment & Plan Note (Signed)
Home lisinopril held on admission due to elevated lactic acid and normal tensive to low normotensive blood pressure A.m. team to resume when the benefits outweigh the risks Hydralazine 5 mg IV every 6 hours.  For SBP greater than 170, 5 days ordered

## 2022-10-31 NOTE — Progress Notes (Signed)
1      PROGRESS NOTE    Tracy Mann  MWU:[id number] DOB: Mar 19, 1933 DOA: 10/30/2022 PCP: Bosie Clos, MD (  Brief Narrative:   89 year old female with history of insulin-dependent diabetes mellitus, hypertension, chronic pain, anxiety, atrial fibrillation on Eliquis, restless leg syndrome, hypothyroid, admitted for worsening right leg and right hand rash   10/1: WOC c/s   Assessment & Plan:   Principal Problem:   Cellulitis of right leg Active Problems:   Long-term current use of benzodiazepine   Long term current use of opiate analgesic   Hyperlipidemia   Anxiety, generalized   Acid reflux   Hypothyroidism   Obstructive sleep apnea   Diabetes mellitus (HCC)   Atrial fibrillation (HCC)   Benign hypertensive heart disease without congestive heart failure   Impetigo   CKD stage 3b, GFR 30-44 ml/min (HCC)   Essential hypertension   * Cellulitis of right leg Sepsis ruled out. Home clindamycin not resumed on admission due to patient reaction Will cover for MRSA as patient has insulin-dependent diabetes mellitus Continue Vancomycin per pharmacy; doxycycline 100 mg twice daily, 7 days ordered Blood cultures x 2 are in process Continue IVFs   Long term current use of opiate analgesic Continue Norco for pain control   Long-term current use of benzodiazepine Continue Buspar and Ativan   Essential hypertension Home lisinopril held on admission due to elevated lactic acid and normal tensive to low normotensive blood pressure Continue Hydralazine & Lopressor prn for now Continue Torsemide   CKD stage 3b, GFR 30-44 ml/min (HCC) At baseline   DVT prophylaxis: Place TED hose Start: 10/30/22 2053 apixaban (ELIQUIS) tablet 5 mg     Code Status: (Full Code Family Communication: ( NO "discussed with patient") Disposition Plan: Possible D/C in 1-2 days depending on clinical condition    Consultants:  WOC   Antimicrobials:  Vanco Doxy     Subjective:  Concerned about her rash on right hand (feels it may be spreading) and right leg  Objective: Vitals:   10/31/22 0748 10/31/22 1542 10/31/22 1544 10/31/22 1615  BP: (!) 131/59 133/67 133/67 (!) 111/57  Pulse: 70 81 81 83  Resp: 18 (!) 24 (!) 24 18  Temp: 98 F (36.7 C) 97.6 F (36.4 C) 97.6 F (36.4 C) 98.1 F (36.7 C)  TempSrc:   Oral   SpO2: 91% 92% 92% 99%  Weight:      Height:        Intake/Output Summary (Last 24 hours) at 10/31/2022 1627 Last data filed at 10/31/2022 1431 Gross per 24 hour  Intake 2611.73 ml  Output 0 ml  Net 2611.73 ml   Filed Weights   10/30/22 1507  Weight: 91.6 kg    Examination:  General exam: Appears calm and comfortable  Respiratory system: Clear to auscultation. Respiratory effort normal. Cardiovascular system: S1 & S2 heard, RRR. No JVD, murmurs, rubs, gallops or clicks. No pedal edema. Gastrointestinal system: Abdomen is nondistended, soft and nontender. No organomegaly or masses felt. Normal bowel sounds heard. Central nervous system: Alert and oriented. No focal neurological deficits. Extremities: Symmetric 5 x 5 power. Skin: see Pictures below Psychiatry: Judgement and insight appear normal. Mood & affect appropriate.          Data Reviewed: I have personally reviewed following labs and imaging studies  CBC: Recent Labs  Lab 10/30/22 1509 10/31/22 0649  WBC 10.5 8.9  HGB 11.6* 10.6*  HCT 36.3 33.6*  MCV 96.5 97.4  PLT 221  197   Basic Metabolic Panel: Recent Labs  Lab 10/30/22 1509 10/31/22 0649  NA 135 137  K 4.2 4.6  CL 95* 103  CO2 30 28  GLUCOSE 179* 130*  BUN 43* 35*  CREATININE 1.56* 1.20*  CALCIUM 8.7* 8.2*   GFR: Estimated Creatinine Clearance: 35.5 mL/min (A) (by C-G formula based on SCr of 1.2 mg/dL (H)). Liver Function Tests: Recent Labs  Lab 10/30/22 2232  AST 22  ALT 11  ALKPHOS 84  BILITOT 0.7  PROT 7.0  ALBUMIN 3.4*    HbA1C: Recent Labs    10/31/22 0649   HGBA1C 6.7*   CBG: Recent Labs  Lab 10/30/22 2257 10/31/22 0750 10/31/22 1144  GLUCAP 148* 104* 129*   Sepsis Labs: Recent Labs  Lab 10/30/22 1848 10/30/22 2232 10/31/22 0019  LATICACIDVEN 0.9 2.4* 1.4    Recent Results (from the past 240 hour(s))  Blood culture (routine x 2)     Status: None (Preliminary result)   Collection Time: 10/30/22  6:48 PM   Specimen: BLOOD  Result Value Ref Range Status   Specimen Description BLOOD LEFT ANTECUBITAL  Final   Special Requests   Final    BOTTLES DRAWN AEROBIC AND ANAEROBIC Blood Culture adequate volume   Culture   Final    NO GROWTH < 24 HOURS Performed at [facility], [address] [place], [place] [address]    Report Status PENDING  Incomplete  Blood culture (routine x 2)     Status: None (Preliminary result)   Collection Time: 10/30/22 10:32 PM   Specimen: BLOOD  Result Value Ref Range Status   Specimen Description BLOOD BLOOD LEFT HAND  Final   Special Requests   Final    BOTTLES DRAWN AEROBIC AND ANAEROBIC Blood Culture adequate volume   Culture   Final    NO GROWTH < 12 HOURS Performed at [facility], [address] [place], [place] [address]    Report Status PENDING  Incomplete         Radiology Studies: No results found.      Scheduled Meds:  apixaban  5 mg Oral BID   busPIRone  5 mg Oral BID   ezetimibe  10 mg Oral QHS   insulin aspart  0-5 Units Subcutaneous QHS   insulin aspart  0-9 Units Subcutaneous TID WC   insulin glargine-yfgn  20 Units Subcutaneous QHS   levothyroxine  25 mcg Oral Q0600   LORazepam  0.5 mg Oral BID   magnesium oxide  800 mg Oral Daily   mupirocin cream   Topical TID   torsemide  40 mg Oral Daily   Continuous Infusions:  sodium chloride 125 mL/hr at 10/31/22 0024   doxycycline (VIBRAMYCIN) IV 100 mg (10/31/22 1051)   [START ON 11/01/2022] vancomycin       LOS: 1 day    Time spent: 35 mins    Girtie Wiersma Sherryll Burger, MD [facility] Pager  [phone]  If 7PM-7AM, please contact night-coverage [email or web address] Password [facility] 10/31/2022, 4:27 PM

## 2022-10-31 NOTE — Assessment & Plan Note (Signed)
At baseline 

## 2022-10-31 NOTE — Consult Note (Addendum)
WOC Nurse Consult Note: this consult performed remotely utilizing EMR records including photo documentation and chat with bedside nurse.  Patient has been followed at [facility] by primary MD for wound R lower anterior leg, treated with Mupirocin; patient presented to [facility] for cellulitis of same leg  Noted by primary MD to have venous insufficiency  Reason for Consult: R lower extremity wound  Wound type: full thickness likely r/t venous insufficiency  Pressure Injury POA: NA  Measurement: see nursing flowsheet  Wound bed: 75% red moist 25% yellow  Drainage (amount, consistency, odor)  see nursing flowsheet Periwound:  edema, erythema, peeling epithelium   Dressing procedure/placement/frequency: Cleanse R lower anterior leg wound with NS, apply silver hydrofiber [place] [phone]) to wound bed daily, cover with ABD pad and wrap with Kerlix beginning just above toes and ending right below knees.  May secure with Ace bandage wrapped in same fashion as Kerlix for light compression. MOISTEN SILVER WITH NS IF STUCK TO WOUND BED FOR ATRAUMATIC REMOVAL.   Per primary MD note 10/26/2022 referral is being made for patient to be followed at wound care clinic for further management of this wound and venous insufficiency.    POC discussed with bedside nurse. WOC team will not follow at this time. Re-consult if further needs arise.   Thank you,    Priscella Mann MSN, RN-BC, [employer] [phone]

## 2022-11-01 DIAGNOSIS — L03115 Cellulitis of right lower limb: Secondary | ICD-10-CM | POA: Diagnosis not present

## 2022-11-01 LAB — CBC
HCT: 33.4 % — ABNORMAL LOW (ref 36.0–46.0)
Hemoglobin: 10.8 g/dL — ABNORMAL LOW (ref 12.0–15.0)
MCH: 30.9 pg (ref 26.0–34.0)
MCHC: 32.3 g/dL (ref 30.0–36.0)
MCV: 95.4 fL (ref 80.0–100.0)
Platelets: 193 10*3/uL (ref 150–400)
RBC: 3.5 MIL/uL — ABNORMAL LOW (ref 3.87–5.11)
RDW: 13 % (ref 11.5–15.5)
WBC: 12.5 10*3/uL — ABNORMAL HIGH (ref 4.0–10.5)
nRBC: 0 % (ref 0.0–0.2)

## 2022-11-01 LAB — BASIC METABOLIC PANEL
Anion gap: 11 (ref 5–15)
BUN: 32 mg/dL — ABNORMAL HIGH (ref 8–23)
CO2: 27 mmol/L (ref 22–32)
Calcium: 8.7 mg/dL — ABNORMAL LOW (ref 8.9–10.3)
Chloride: 98 mmol/L (ref 98–111)
Creatinine, Ser: 1.1 mg/dL — ABNORMAL HIGH (ref 0.44–1.00)
GFR, Estimated: 49 mL/min — ABNORMAL LOW (ref >60)
Glucose, Bld: 134 mg/dL — ABNORMAL HIGH (ref 70–99)
Potassium: 4.1 mmol/L (ref 3.5–5.1)
Sodium: 136 mmol/L (ref 135–145)

## 2022-11-01 LAB — GLUCOSE, CAPILLARY
Glucose-Capillary: 119 mg/dL — ABNORMAL HIGH (ref 70–99)
Glucose-Capillary: 156 mg/dL — ABNORMAL HIGH (ref 70–99)

## 2022-11-01 MED ORDER — DOXYCYCLINE HYCLATE 100 MG PO TABS: 100.0000 mg | ORAL_TABLET | Freq: Two times a day (BID) | ORAL | 0 refills | Status: DC

## 2022-11-01 MED ORDER — DOXYCYCLINE HYCLATE 100 MG PO TABS
100.0000 mg | ORAL_TABLET | Freq: Two times a day (BID) | ORAL | Status: DC
  Administered 2022-11-01: 100 mg via ORAL
  Filled 2022-11-01: qty 1

## 2022-11-01 MED ORDER — MUPIROCIN CALCIUM 2 % EX CREA: TOPICAL_CREAM | Freq: Three times a day (TID) | CUTANEOUS | 0 refills | Status: DC

## 2022-11-01 NOTE — Progress Notes (Signed)
Pharmacy Antibiotic Note  Tracy Mann is a 89 y.o. female admitted on 10/30/2022 with cellulitis. PMH significant for CKD III, HTN, T2DM, and afib (Eliquis PTA). Patient presenting with worsening pain, rash and erythema to right lower extremity approximately 1 week after sustaining a superficial ulceration. In ED, patient is afebrile with no leukocytosis. Pharmacy has been consulted for vancomycin dosing.  Plan: Day 3 of antibiotics Increase vancomycin to 1000 mg IV Q36H. Goal AUC 400-550. Expected AUC: 499.5 Expected Css min: 12.1 SCr used: 1.1  Weight used: IBW, Vd used: 0.5 (BMI 35.78) Patient is also on doxycycline 100 mg IV Q12H Continue to monitor renal function and follow culture results  Vancomycin 2000 mg IV load x1 given in ED Start vancomycin 1000 mg IV every 48 hours (eAUC 499.8, Css min 12.0, Scr 1.56, Vd 0.5 L/kg) Monitor renal function, clinical status, culture data and LOT  Height: 5\' 3"  (160 cm) Weight: 91.6 kg (202 lb) IBW/kg (Calculated) : 52.4  Temp (24hrs), Avg:97.9 F (36.6 C), Min:97.6 F (36.4 C), Max:98.3 F (36.8 C)  Recent Labs  Lab 10/30/22 1509 10/30/22 1848 10/30/22 2232 10/31/22 0019 10/31/22 0649 11/01/22 0519  WBC 10.5  --   --   --  8.9 12.5*  CREATININE 1.56*  --   --   --  1.20* 1.10*  LATICACIDVEN  --  0.9 2.4* 1.4  --   --     Estimated Creatinine Clearance: 38.7 mL/min (A) (by C-G formula based on SCr of 1.1 mg/dL (H)).    Allergies  Allergen Reactions   Clindamycin/Lincomycin Shortness Of Breath and Swelling   Duloxetine Other (See Comments)    Same as Paroxetine   Paroxetine Other (See Comments)    "Horrible headache," Stomach pain, Diarrhea   Sulfa Antibiotics Anaphylaxis   Cephalexin Other (See Comments)    Blisters   Clarithromycin Other (See Comments)    Hives, headaches, hard time swelling, felt like throat was closing up  Hives, headaches, hard time swelling, felt like throat was closing up    Hives, headaches,  hard time swelling, felt like throat was closing up Hives, headaches, hard time swelling, felt like throat was closing up   Diphenhydramine Other (See Comments)   Estrogens Conjugated    Estrogens Conjugated    Nitrofurantoin Nausea Only   Antimicrobials this admission: vancomycin 9/30 >>  Doxycycline 9/30 >>  Dose adjustments this admission: N/A  Microbiology results: 9/30 BCx: pending  Thank you for involving pharmacy in this patient's care.   Paulita Fujita, PharmD Clinical Pharmacist 11/01/2022 8:07 AM

## 2022-11-01 NOTE — Discharge Summary (Signed)
Physician Discharge Summary   Patient: Tracy Mann MRN: [id number] DOB: 06-18-1933  Admit date:     10/30/2022  Discharge date: 11/01/22  Discharge Physician: Enedina Finner   PCP: Bosie Clos, MD   Recommendations at discharge:   follow-up Dr. Sullivan Lone in 1 to 2 week  Discharge Diagnoses: Principal Problem:   Cellulitis of right leg Active Problems:   Long-term current use of benzodiazepine   Long term current use of opiate analgesic   Hyperlipidemia   Anxiety, generalized   Acid reflux   Hypothyroidism   Obstructive sleep apnea   Diabetes mellitus (HCC)   Atrial fibrillation (HCC)   Benign hypertensive heart disease without congestive heart failure   Impetigo   CKD stage 3b, GFR 30-44 ml/min (HCC)   Essential hypertension  Cellulitis of right leg acute on chronic Right hand rash--improving Sepsis ruled out. Home clindamycin not resumed on admission due to patient reaction Will cover for MRSA as patient has insulin-dependent diabetes mellitus with doxycycline 100 mg twice daily, 7 days ordered Blood cultures x 2 negative Cont WOC instruction for wound care   Long term current use of opiate analgesic --Continue Norco for pain control   Long-term current use of benzodiazepine --Continue Buspar and Ativan   Essential hypertension Resumed home meds at discharge   CKD stage 3b, GFR 30-44 ml/min (HCC) At baseline  Patient able to ambulate in the room without any difficulty with her rollater walker.     DVT prophylaxis: Place TED hose Start: 10/30/22 2053 apixaban (ELIQUIS) tablet 5 mg       Pain control - [place] Controlled Substance Reporting System database was reviewed. and patient was instructed, not to drive, operate heavy machinery, perform activities at heights, swimming or participation in water activities or provide baby-sitting services while on Pain, Sleep and Anxiety Medications; until their outpatient Physician has advised to do so again.  Also recommended to not to take more than prescribed Pain, Sleep and Anxiety Medications.  Consultants: none Disposition: Home  DISCHARGE MEDICATION: Allergies as of 11/01/2022       Reactions   Clindamycin/lincomycin Shortness Of Breath, Swelling   Duloxetine Other (See Comments)   Same as Paroxetine   Paroxetine Other (See Comments)   "Horrible headache," Stomach pain, Diarrhea   Sulfa Antibiotics Anaphylaxis   Cephalexin Other (See Comments)   Blisters   Clarithromycin Other (See Comments)   Hives, headaches, hard time swelling, felt like throat was closing up Hives, headaches, hard time swelling, felt like throat was closing up    Hives, headaches, hard time swelling, felt like throat was closing up Hives, headaches, hard time swelling, felt like throat was closing up   Diphenhydramine Other (See Comments)   Estrogens Conjugated    Estrogens Conjugated    Nitrofurantoin Nausea Only        Medication List     STOP taking these medications    busPIRone 5 MG tablet Commonly known as: BUSPAR   polyethylene glycol powder 17 GM/SCOOP powder Commonly known as: GLYCOLAX/MIRALAX       TAKE these medications    albuterol 108 (90 Base) MCG/ACT inhaler Commonly known as: VENTOLIN HFA TAKE 2 PUFFS BY MOUTH EVERY 6 HOURS AS NEEDED FOR WHEEZE OR SHORTNESS OF BREATH   apixaban 5 MG Tabs tablet Commonly known as: ELIQUIS Take 1 tablet (5 mg total) by mouth 2 (two) times daily.   doxycycline 100 MG tablet Commonly known as: VIBRA-TABS Take 1 tablet (100 mg total) by  mouth every 12 (twelve) hours for 6 days.   ezetimibe 10 MG tablet Commonly known as: ZETIA Take 1 tablet (10 mg total) by mouth daily.   flavoxATE 100 MG tablet Commonly known as: URISPAS Take 1 tablet (100 mg total) by mouth 2 (two) times daily as needed for bladder spasms.   fluticasone 50 MCG/ACT nasal spray Commonly known as: FLONASE Place 2 sprays into both nostrils daily.    HYDROcodone-acetaminophen 10-325 MG tablet Commonly known as: Norco Take 1 tablet by mouth every 8 (eight) hours as needed for severe pain. Must last 30 days.   HYDROcodone-acetaminophen 10-325 MG tablet Commonly known as: Norco Take 1 tablet by mouth every 8 (eight) hours as needed for severe pain. Must last 30 days. Start taking on: November 22, 2022   HYDROcodone-acetaminophen 10-325 MG tablet Commonly known as: Norco Take 1 tablet by mouth every 8 (eight) hours as needed for severe pain. Must last 30 days. Start taking on: December 22, 2022   insulin lispro 100 UNIT/ML KwikPen Commonly known as: HUMALOG Inject 10-25 Units into the skin in the morning, at noon, and at bedtime.   Lantus SoloStar 100 UNIT/ML Solostar Pen Generic drug: insulin glargine Inject 24 Units into the skin at bedtime.   levothyroxine 25 MCG tablet Commonly known as: SYNTHROID Take 25 mcg by mouth daily before breakfast.   lisinopril 10 MG tablet Commonly known as: ZESTRIL Take 1 tablet (10 mg total) by mouth daily.   LORazepam 0.5 MG tablet Commonly known as: ATIVAN Take 0.5 mg by mouth 2 (two) times daily.   Magnesium 500 MG Caps Take 1,000 mg by mouth daily.   metolazone 2.5 MG tablet Commonly known as: ZAROXOLYN Take 1 tablet (2.5 mg total) by mouth as directed. Take two times a week with torsemide 40 mg   mupirocin cream 2 % Commonly known as: BACTROBAN Apply topically 3 (three) times daily.   naloxone 4 MG/0.1ML Liqd nasal spray kit Commonly known as: NARCAN Place 1 spray into the nose as needed for up to 365 doses (for opioid-induced respiratory depresssion). In case of emergency (overdose), spray once into each nostril. If no response within 3 minutes, repeat application and call 911.   nitrofurantoin 50 MG capsule Commonly known as: MACRODANTIN Take 50 mg by mouth at bedtime.   nitroGLYCERIN 0.4 MG SL tablet Commonly known as: NITROSTAT Place 1 tablet (0.4 mg total) under the  tongue every 5 (five) minutes as needed for chest pain.   NONFORMULARY OR COMPOUNDED ITEM See pharmacy note- Not to use on any open skin or wound.   ondansetron 4 MG tablet Commonly known as: Zofran Take 1 tablet (4 mg total) by mouth every 12 (twelve) hours as needed for nausea or vomiting.   pantoprazole 40 MG tablet Commonly known as: PROTONIX Take 40 mg by mouth as needed.   phenazopyridine 95 MG tablet Commonly known as: PYRIDIUM Take 95 mg by mouth 3 (three) times daily as needed for pain.   RELION PEN NEEDLE 31G/8MM 31G X 8 MM Misc Generic drug: Insulin Pen Needle Use with insulin pen three times daily   rOPINIRole 0.25 MG tablet Commonly known as: REQUIP TAKE 1 TABLET BY MOUTH EVERYDAY AT BEDTIME   torsemide 20 MG tablet Commonly known as: DEMADEX Take 2 tablets (40 mg total) by mouth daily.               Discharge Care Instructions  (From admission, onward)  Start     Ordered   11/01/22 0000  Discharge wound care:       Comments: 10/31/22 1210    Wound care  Daily      Comments: Cleanse R lower anterior leg wound with NS, apply silver hydrofiber [place] [phone]) to wound bed daily, cover with ABD pad and wrap with Kerlix beginning just above toes and ending right below knees. May secure with Ace bandage wrapped in same fashion as Kerlix for light compression.   11/01/22 1018            Follow-up Information     Bosie Clos, MD. Go on 11/09/2022.   Specialty: Family Medicine Why: right hand rash check;   ([phone])Appt @ 9:00 am Contact information: [address] [place] [address] [phone]                Discharge Exam: Ceasar Mons Weights   10/30/22 1507  Weight: 91.6 kg        Condition at discharge: fair  The results of significant diagnostics from this hospitalization (including imaging, microbiology, ancillary and laboratory) are listed below for reference.   Imaging Studies: No results  found.  Microbiology: Results for orders placed or performed during the hospital encounter of 10/30/22  Blood culture (routine x 2)     Status: None (Preliminary result)   Collection Time: 10/30/22  6:48 PM   Specimen: BLOOD  Result Value Ref Range Status   Specimen Description BLOOD LEFT ANTECUBITAL  Final   Special Requests   Final    BOTTLES DRAWN AEROBIC AND ANAEROBIC Blood Culture adequate volume   Culture   Final    NO GROWTH 2 DAYS Performed at [facility], [address] [place], [place] [address]    Report Status PENDING  Incomplete  Blood culture (routine x 2)     Status: None (Preliminary result)   Collection Time: 10/30/22 10:32 PM   Specimen: BLOOD  Result Value Ref Range Status   Specimen Description BLOOD BLOOD LEFT HAND  Final   Special Requests   Final    BOTTLES DRAWN AEROBIC AND ANAEROBIC Blood Culture adequate volume   Culture   Final    NO GROWTH 2 DAYS Performed at [facility], [address] [place], [place] [address]    Report Status PENDING  Incomplete    Labs: CBC: Recent Labs  Lab 10/30/22 1509 10/31/22 0649 11/01/22 0519  WBC 10.5 8.9 12.5*  HGB 11.6* 10.6* 10.8*  HCT 36.3 33.6* 33.4*  MCV 96.5 97.4 95.4  PLT 221 197 193   Basic Metabolic Panel: Recent Labs  Lab 10/30/22 1509 10/31/22 0649 11/01/22 0519  NA 135 137 136  K 4.2 4.6 4.1  CL 95* 103 98  CO2 30 28 27   GLUCOSE 179* 130* 134*  BUN 43* 35* 32*  CREATININE 1.56* 1.20* 1.10*  CALCIUM 8.7* 8.2* 8.7*   Liver Function Tests: Recent Labs  Lab 10/30/22 2232  AST 22  ALT 11  ALKPHOS 84  BILITOT 0.7  PROT 7.0  ALBUMIN 3.4*   CBG: Recent Labs  Lab 10/31/22 1144 10/31/22 1707 10/31/22 2219 11/01/22 0747 11/01/22 1132  GLUCAP 129* 119* 155* 119* 156*    Discharge time spent: greater than 30 minutes.  Signed: Enedina Finner, MD [facility] 11/01/2022

## 2022-11-01 NOTE — TOC Transition Note (Signed)
Transition of Care [facility]) - CM/SW Discharge Note   Patient Details  Name: Tracy Mann MRN: [id number] Date of Birth: [birth year]  Transition of Care [facility]) CM/SW Contact:  Marlowe Sax, RN Phone Number: 11/01/2022, 10:38 AM   Clinical Narrative:     The patient will DC home today, no TOC needs Identified  Final next level of care: Home/Self Care Barriers to Discharge: Continued Medical Work up   Patient Goals and CMS Choice      Discharge Placement                         Discharge Plan and Services Additional resources added to the After Visit Summary for     Discharge Planning Services: CM Consult                                 Social Determinants of Health (SDOH) Interventions SDOH Screenings   Food Insecurity: No Food Insecurity (10/30/2022)  Housing: Patient Declined (10/30/2022)  Transportation Needs: No Transportation Needs (10/30/2022)  Utilities: Not At Risk (10/30/2022)  Alcohol Screen: Low Risk  (10/06/2021)  Depression (PHQ2-9): Low Risk  (07/19/2022)  Financial Resource Strain: Low Risk  (07/27/2022)   Received from [facility]  Stress: No Stress Concern Present (03/14/2017)  Tobacco Use: Low Risk  (10/31/2022)     Readmission Risk Interventions     No data to display

## 2022-11-01 NOTE — Discharge Instructions (Signed)
Dressing changes per instruction Call your PCP or go to nearest urgent care/ER if your right hand infection worsens

## 2022-11-01 NOTE — Plan of Care (Signed)
  Problem: Nutritional: Goal: Maintenance of adequate nutrition will improve Outcome: Progressing   Problem: Education: Goal: Knowledge of General Education information will improve Description: Including pain rating scale, medication(s)/side effects and non-pharmacologic comfort measures Outcome: Progressing   Problem: Clinical Measurements: Goal: Cardiovascular complication will be avoided Outcome: Progressing   Problem: Nutrition: Goal: Adequate nutrition will be maintained Outcome: Progressing   Problem: Elimination: Goal: Will not experience complications related to bowel motility Outcome: Progressing   Problem: Pain Managment: Goal: General experience of comfort will improve Outcome: Progressing   Problem: Safety: Goal: Ability to remain free from injury will improve Outcome: Progressing

## 2022-11-04 ENCOUNTER — Emergency Department (HOSPITAL_BASED_OUTPATIENT_CLINIC_OR_DEPARTMENT_OTHER): Payer: Medicare Other | Admitting: Radiology

## 2022-11-04 ENCOUNTER — Inpatient Hospital Stay (HOSPITAL_BASED_OUTPATIENT_CLINIC_OR_DEPARTMENT_OTHER)
Admission: EM | Admit: 2022-11-04 | Discharge: 2022-11-07 | DRG: 392 | Disposition: A | Discharge status: Home / Self Care | Payer: Medicare Other | Attending: Internal Medicine | Admitting: Internal Medicine

## 2022-11-04 ENCOUNTER — Encounter (HOSPITAL_BASED_OUTPATIENT_CLINIC_OR_DEPARTMENT_OTHER): Payer: Self-pay

## 2022-11-04 ENCOUNTER — Other Ambulatory Visit: Payer: Self-pay

## 2022-11-04 DIAGNOSIS — G894 Chronic pain syndrome: Secondary | ICD-10-CM | POA: Diagnosis present

## 2022-11-04 DIAGNOSIS — Z79899 Other long term (current) drug therapy: Secondary | ICD-10-CM

## 2022-11-04 DIAGNOSIS — Z6836 Body mass index (BMI) 36.0-36.9, adult: Secondary | ICD-10-CM

## 2022-11-04 DIAGNOSIS — Z888 Allergy status to other drugs, medicaments and biological substances status: Secondary | ICD-10-CM

## 2022-11-04 DIAGNOSIS — K2100 Gastro-esophageal reflux disease with esophagitis, without bleeding: Principal | ICD-10-CM | POA: Diagnosis present

## 2022-11-04 DIAGNOSIS — K208 Other esophagitis without bleeding: Secondary | ICD-10-CM | POA: Diagnosis not present

## 2022-11-04 DIAGNOSIS — E66812 Obesity, class 2: Secondary | ICD-10-CM | POA: Diagnosis present

## 2022-11-04 DIAGNOSIS — I130 Hypertensive heart and chronic kidney disease with heart failure and stage 1 through stage 4 chronic kidney disease, or unspecified chronic kidney disease: Secondary | ICD-10-CM | POA: Diagnosis present

## 2022-11-04 DIAGNOSIS — Z9851 Tubal ligation status: Secondary | ICD-10-CM

## 2022-11-04 DIAGNOSIS — Z8249 Family history of ischemic heart disease and other diseases of the circulatory system: Secondary | ICD-10-CM

## 2022-11-04 DIAGNOSIS — T50905A Adverse effect of unspecified drugs, medicaments and biological substances, initial encounter: Principal | ICD-10-CM

## 2022-11-04 DIAGNOSIS — I252 Old myocardial infarction: Secondary | ICD-10-CM

## 2022-11-04 DIAGNOSIS — N179 Acute kidney failure, unspecified: Secondary | ICD-10-CM | POA: Diagnosis present

## 2022-11-04 DIAGNOSIS — R0789 Other chest pain: Secondary | ICD-10-CM | POA: Diagnosis present

## 2022-11-04 DIAGNOSIS — E039 Hypothyroidism, unspecified: Secondary | ICD-10-CM | POA: Diagnosis present

## 2022-11-04 DIAGNOSIS — E785 Hyperlipidemia, unspecified: Secondary | ICD-10-CM | POA: Diagnosis present

## 2022-11-04 DIAGNOSIS — I428 Other cardiomyopathies: Secondary | ICD-10-CM | POA: Diagnosis present

## 2022-11-04 DIAGNOSIS — I700 Atherosclerosis of aorta: Secondary | ICD-10-CM | POA: Diagnosis present

## 2022-11-04 DIAGNOSIS — L03115 Cellulitis of right lower limb: Secondary | ICD-10-CM | POA: Diagnosis present

## 2022-11-04 DIAGNOSIS — G2581 Restless legs syndrome: Secondary | ICD-10-CM | POA: Diagnosis present

## 2022-11-04 DIAGNOSIS — T462X5A Adverse effect of other antidysrhythmic drugs, initial encounter: Secondary | ICD-10-CM | POA: Diagnosis present

## 2022-11-04 DIAGNOSIS — Z7901 Long term (current) use of anticoagulants: Secondary | ICD-10-CM

## 2022-11-04 DIAGNOSIS — Z881 Allergy status to other antibiotic agents status: Secondary | ICD-10-CM

## 2022-11-04 DIAGNOSIS — R1310 Dysphagia, unspecified: Secondary | ICD-10-CM | POA: Diagnosis present

## 2022-11-04 DIAGNOSIS — L03119 Cellulitis of unspecified part of limb: Secondary | ICD-10-CM | POA: Diagnosis present

## 2022-11-04 DIAGNOSIS — Z882 Allergy status to sulfonamides status: Secondary | ICD-10-CM

## 2022-11-04 DIAGNOSIS — I959 Hypotension, unspecified: Secondary | ICD-10-CM | POA: Diagnosis present

## 2022-11-04 DIAGNOSIS — Z9049 Acquired absence of other specified parts of digestive tract: Secondary | ICD-10-CM

## 2022-11-04 DIAGNOSIS — I10 Essential (primary) hypertension: Secondary | ICD-10-CM | POA: Diagnosis present

## 2022-11-04 DIAGNOSIS — K219 Gastro-esophageal reflux disease without esophagitis: Secondary | ICD-10-CM | POA: Diagnosis present

## 2022-11-04 DIAGNOSIS — R4701 Aphasia: Secondary | ICD-10-CM | POA: Diagnosis present

## 2022-11-04 DIAGNOSIS — E1122 Type 2 diabetes mellitus with diabetic chronic kidney disease: Secondary | ICD-10-CM | POA: Diagnosis present

## 2022-11-04 DIAGNOSIS — I482 Chronic atrial fibrillation, unspecified: Secondary | ICD-10-CM | POA: Diagnosis present

## 2022-11-04 DIAGNOSIS — Z90710 Acquired absence of both cervix and uterus: Secondary | ICD-10-CM

## 2022-11-04 DIAGNOSIS — D649 Anemia, unspecified: Secondary | ICD-10-CM | POA: Diagnosis present

## 2022-11-04 DIAGNOSIS — E119 Type 2 diabetes mellitus without complications: Secondary | ICD-10-CM

## 2022-11-04 DIAGNOSIS — I2510 Atherosclerotic heart disease of native coronary artery without angina pectoris: Secondary | ICD-10-CM | POA: Diagnosis present

## 2022-11-04 DIAGNOSIS — Z79890 Hormone replacement therapy (postmenopausal): Secondary | ICD-10-CM

## 2022-11-04 DIAGNOSIS — I5042 Chronic combined systolic (congestive) and diastolic (congestive) heart failure: Secondary | ICD-10-CM | POA: Diagnosis present

## 2022-11-04 DIAGNOSIS — I255 Ischemic cardiomyopathy: Secondary | ICD-10-CM | POA: Diagnosis present

## 2022-11-04 DIAGNOSIS — E782 Mixed hyperlipidemia: Secondary | ICD-10-CM | POA: Diagnosis present

## 2022-11-04 DIAGNOSIS — T364X5A Adverse effect of tetracyclines, initial encounter: Secondary | ICD-10-CM | POA: Diagnosis present

## 2022-11-04 DIAGNOSIS — Z8349 Family history of other endocrine, nutritional and metabolic diseases: Secondary | ICD-10-CM

## 2022-11-04 DIAGNOSIS — N1832 Chronic kidney disease, stage 3b: Secondary | ICD-10-CM | POA: Diagnosis present

## 2022-11-04 DIAGNOSIS — Z794 Long term (current) use of insulin: Secondary | ICD-10-CM

## 2022-11-04 LAB — CBC WITH DIFFERENTIAL/PLATELET
Abs Immature Granulocytes: 0.03 10*3/uL (ref 0.00–0.07)
Basophils Absolute: 0 10*3/uL (ref 0.0–0.1)
Basophils Relative: 0 %
Eosinophils Absolute: 0.2 10*3/uL (ref 0.0–0.5)
Eosinophils Relative: 2 %
HCT: 34.2 % — ABNORMAL LOW (ref 36.0–46.0)
Hemoglobin: 11.4 g/dL — ABNORMAL LOW (ref 12.0–15.0)
Immature Granulocytes: 0 %
Lymphocytes Relative: 19 %
Lymphs Abs: 1.9 10*3/uL (ref 0.7–4.0)
MCH: 31.1 pg (ref 26.0–34.0)
MCHC: 33.3 g/dL (ref 30.0–36.0)
MCV: 93.2 fL (ref 80.0–100.0)
Monocytes Absolute: 0.8 10*3/uL (ref 0.1–1.0)
Monocytes Relative: 8 %
Neutro Abs: 7.1 10*3/uL (ref 1.7–7.7)
Neutrophils Relative %: 71 %
Platelets: 215 10*3/uL (ref 150–400)
RBC: 3.67 MIL/uL — ABNORMAL LOW (ref 3.87–5.11)
RDW: 12.9 % (ref 11.5–15.5)
WBC: 10.2 10*3/uL (ref 4.0–10.5)
nRBC: 0 % (ref 0.0–0.2)

## 2022-11-04 LAB — BASIC METABOLIC PANEL
Anion gap: 11 (ref 5–15)
BUN: 27 mg/dL — ABNORMAL HIGH (ref 8–23)
CO2: 31 mmol/L (ref 22–32)
Calcium: 10.3 mg/dL (ref 8.9–10.3)
Chloride: 98 mmol/L (ref 98–111)
Creatinine, Ser: 1.16 mg/dL — ABNORMAL HIGH (ref 0.44–1.00)
GFR, Estimated: 46 mL/min — ABNORMAL LOW (ref >60)
Glucose, Bld: 151 mg/dL — ABNORMAL HIGH (ref 70–99)
Potassium: 3.8 mmol/L (ref 3.5–5.1)
Sodium: 140 mmol/L (ref 135–145)

## 2022-11-04 LAB — TROPONIN I (HIGH SENSITIVITY)
Troponin I (High Sensitivity): 25 ng/L — ABNORMAL HIGH (ref <18)
Troponin I (High Sensitivity): 22 ng/L — ABNORMAL HIGH (ref <18)

## 2022-11-04 LAB — CULTURE, BLOOD (ROUTINE X 2)
Culture: NO GROWTH
Culture: NO GROWTH
Special Requests: ADEQUATE
Special Requests: ADEQUATE

## 2022-11-04 MED ORDER — ALUM & MAG HYDROXIDE-SIMETH 200-200-20 MG/5ML PO SUSP
30.0000 mL | Freq: Once | ORAL | Status: AC
  Administered 2022-11-04: 30 mL via ORAL
  Filled 2022-11-04: qty 30

## 2022-11-04 MED ORDER — ONDANSETRON HCL 4 MG/2ML IJ SOLN
4.0000 mg | Freq: Once | INTRAMUSCULAR | Status: AC
  Administered 2022-11-04: 4 mg via INTRAVENOUS
  Filled 2022-11-04: qty 2

## 2022-11-04 MED ORDER — LIDOCAINE VISCOUS HCL 2 % MT SOLN
15.0000 mL | Freq: Once | OROMUCOSAL | Status: AC
  Administered 2022-11-04: 15 mL via OROMUCOSAL
  Filled 2022-11-04: qty 15

## 2022-11-04 MED ORDER — FAMOTIDINE IN NACL 20-0.9 MG/50ML-% IV SOLN
20.0000 mg | Freq: Once | INTRAVENOUS | Status: AC
  Administered 2022-11-04: 20 mg via INTRAVENOUS
  Filled 2022-11-04: qty 50

## 2022-11-04 MED ORDER — SUCRALFATE 1 GM/10ML PO SUSP
1.0000 g | Freq: Three times a day (TID) | ORAL | Status: DC
  Administered 2022-11-04 – 2022-11-07 (×11): 1 g via ORAL
  Filled 2022-11-04 (×11): qty 10

## 2022-11-04 MED ORDER — DEXTROSE 5 % IV SOLN
1500.0000 mg | Freq: Once | INTRAVENOUS | Status: DC
  Filled 2022-11-04: qty 75

## 2022-11-04 NOTE — ED Triage Notes (Addendum)
Pt presents with CP x3 days that started after taking an abx. Pt states she is burping frequently, N/V, and ShOB. Pt states "it feels like I have a rock in my esophagus." Pt admitted to hospital earlier this week for cellulitis.

## 2022-11-04 NOTE — ED Notes (Signed)
Patient reports that her "esophagus is painful". Patient reports it has been going on since Wednesday. Patient reports it is difficult to get solid food down. Patient states "it feels like there is a rock in my esophagus". Patient's airway intact, no drooling noted, no obstruction or swelling noted.

## 2022-11-04 NOTE — ED Provider Notes (Signed)
EMERGENCY DEPARTMENT AT [facility] Provider Note   CSN: [id number] Arrival date & time: 11/04/22  1421     History  Chief Complaint  Patient presents with   Chest Pain    Tracy Mann is a 89 y.o. female presenting to ED with persistent epigastric burning and sharp chest pain.  Patient hospitalized for cellulitis and discharged 3 days ago on 11/01/22, started on doxycycline oral at discharge.  Reports for past 2 days burning, stabbing discomfort with swallowing, difficulty keeping down food or fluids ("feels like I'm swallowing razor blades").  Tried TUMS with no relief.    Hx of multiple drug/antibiotic allergies and intolerances  Taking her other meds including eliquis for A Fib  Has wound care appointment coming up  Feels wound on right hand improving with cream, unsure if right leg wound improved in terms of redness.  Son at bedside feels that it has.  HPI     Home Medications Prior to Admission medications   Medication Sig Start Date End Date Taking? Authorizing Provider  albuterol (VENTOLIN HFA) 108 (90 Base) MCG/ACT inhaler TAKE 2 PUFFS BY MOUTH EVERY 6 HOURS AS NEEDED FOR WHEEZE OR SHORTNESS OF BREATH 03/19/20   Bosie Clos, MD  apixaban (ELIQUIS) 5 MG TABS tablet Take 1 tablet (5 mg total) by mouth 2 (two) times daily. 03/20/22   Antonieta Iba, MD  doxycycline (VIBRA-TABS) 100 MG tablet Take 1 tablet (100 mg total) by mouth every 12 (twelve) hours for 6 days. 11/01/22 11/07/22  Enedina Finner, MD  ezetimibe (ZETIA) 10 MG tablet Take 1 tablet (10 mg total) by mouth daily. 07/26/21   Bosie Clos, MD  flavoxATE (URISPAS) 100 MG tablet Take 1 tablet (100 mg total) by mouth 2 (two) times daily as needed for bladder spasms. 10/17/21   MacDiarmid, Lorin Picket, MD  fluticasone (FLONASE) 50 MCG/ACT nasal spray Place 2 sprays into both nostrils daily. 01/06/22   Alfredia Ferguson, PA-C  HYDROcodone-acetaminophen (NORCO) 10-325 MG tablet Take 1 tablet by  mouth every 8 (eight) hours as needed for severe pain. Must last 30 days. 10/23/22 11/22/22  Delano Metz, MD  HYDROcodone-acetaminophen (NORCO) 10-325 MG tablet Take 1 tablet by mouth every 8 (eight) hours as needed for severe pain. Must last 30 days. 11/22/22 12/22/22  Delano Metz, MD  HYDROcodone-acetaminophen (NORCO) 10-325 MG tablet Take 1 tablet by mouth every 8 (eight) hours as needed for severe pain. Must last 30 days. 12/22/22 01/21/23  Delano Metz, MD  insulin glargine (LANTUS SOLOSTAR) 100 UNIT/ML Solostar Pen Inject 24 Units into the skin at bedtime. 05/20/19   [provider]  insulin lispro (HUMALOG) 100 UNIT/ML KwikPen Inject 10-25 Units into the skin in the morning, at noon, and at bedtime. 10/12/20   [provider]  Insulin Pen Needle (RELION PEN NEEDLE 31G/8MM) 31G X 8 MM MISC Use with insulin pen three times daily 03/24/19   Bosie Clos, MD  levothyroxine (SYNTHROID) 25 MCG tablet Take 25 mcg by mouth daily before breakfast.    [provider]  lisinopril (ZESTRIL) 10 MG tablet Take 1 tablet (10 mg total) by mouth daily. 06/20/22   Antonieta Iba, MD  LORazepam (ATIVAN) 0.5 MG tablet Take 0.5 mg by mouth 2 (two) times daily. 10/19/22   [provider]  Magnesium 500 MG CAPS Take 1,000 mg by mouth daily.     [provider]  metolazone (ZAROXOLYN) 2.5 MG tablet Take 1 tablet (2.5 mg total) by  mouth as directed. Take two times a week with torsemide 40 mg 06/20/22   Antonieta Iba, MD  mupirocin cream (BACTROBAN) 2 % Apply topically 3 (three) times daily. 11/01/22   Enedina Finner, MD  naloxone [facility]) nasal spray 4 mg/0.1 mL Place 1 spray into the nose as needed for up to 365 doses (for opioid-induced respiratory depresssion). In case of emergency (overdose), spray once into each nostril. If no response within 3 minutes, repeat application and call 911. 10/18/22 10/18/23  Delano Metz, MD  nitrofurantoin  (MACRODANTIN) 50 MG capsule Take 50 mg by mouth at bedtime. 04/25/22 04/25/23  [provider]  nitroGLYCERIN (NITROSTAT) 0.4 MG SL tablet Place 1 tablet (0.4 mg total) under the tongue every 5 (five) minutes as needed for chest pain. 01/04/21   Antonieta Iba, MD  NONFORMULARY OR COMPOUNDED ITEM See pharmacy note- Not to use on any open skin or wound. 10/06/21   Bosie Clos, MD  ondansetron (ZOFRAN) 4 MG tablet Take 1 tablet (4 mg total) by mouth every 12 (twelve) hours as needed for nausea or vomiting. 07/26/21   Bosie Clos, MD  pantoprazole (PROTONIX) 40 MG tablet Take 40 mg by mouth as needed.    [provider]  phenazopyridine (PYRIDIUM) 95 MG tablet Take 95 mg by mouth 3 (three) times daily as needed for pain.    [provider]  rOPINIRole (REQUIP) 0.25 MG tablet TAKE 1 TABLET BY MOUTH EVERYDAY AT BEDTIME 07/26/21   Bosie Clos, MD  torsemide (DEMADEX) 20 MG tablet Take 2 tablets (40 mg total) by mouth daily. 06/20/22   Antonieta Iba, MD      Allergies    Clindamycin/lincomycin, Duloxetine, Paroxetine, Sulfa antibiotics, Cephalexin, Clarithromycin, Diphenhydramine, Estrogens conjugated, Estrogens conjugated, and Nitrofurantoin    Review of Systems   Review of Systems  Physical Exam Updated Vital Signs BP 128/76   Pulse 86   Temp 98.7 F (37.1 C)   Resp (!) 24   Ht 5\' 3"  (1.6 m)   Wt 93 kg   SpO2 90%   BMI 36.31 kg/m  Physical Exam Constitutional:      General: She is not in acute distress. HENT:     Head: Normocephalic and atraumatic.  Eyes:     Conjunctiva/sclera: Conjunctivae normal.     Pupils: Pupils are equal, round, and reactive to light.  Cardiovascular:     Rate and Rhythm: Normal rate and regular rhythm.  Pulmonary:     Effort: Pulmonary effort is normal. No respiratory distress.  Abdominal:     General: There is no distension.     Tenderness: There is no abdominal tenderness.  Skin:    General: Skin is warm  and dry.     Comments: Red warm circular lesion of right lower anterior leg with ulceration in middle  Neurological:     General: No focal deficit present.     Mental Status: She is alert. Mental status is at baseline.  Psychiatric:        Mood and Affect: Mood normal.        Behavior: Behavior normal.     ED Results / Procedures / Treatments   Labs (all labs ordered are listed, but only abnormal results are displayed) Labs Reviewed  BASIC METABOLIC PANEL - Abnormal; Notable for the following components:      Result Value   Glucose, Bld 151 (*)    BUN 27 (*)    Creatinine, Ser 1.16 (*)  GFR, Estimated 46 (*)    All other components within normal limits  CBC WITH DIFFERENTIAL/PLATELET - Abnormal; Notable for the following components:   RBC 3.67 (*)    Hemoglobin 11.4 (*)    HCT 34.2 (*)    All other components within normal limits  TROPONIN I (HIGH SENSITIVITY) - Abnormal; Notable for the following components:   Troponin I (High Sensitivity) 25 (*)    All other components within normal limits  TROPONIN I (HIGH SENSITIVITY) - Abnormal; Notable for the following components:   Troponin I (High Sensitivity) 22 (*)    All other components within normal limits    EKG EKG Interpretation Date/Time:  Saturday November 04 2022 17:22:06 EDT Ventricular Rate:  77 PR Interval:    QRS Duration:  142 QT Interval:  409 QTC Calculation: 463 R Axis:   -72  Text Interpretation: Atrial fibrillation Nonspecific IVCD with LAD Left ventricular hypertrophy Confirmed by Alvester Chou [phone]) on 11/04/2022 5:22:52 PM  Radiology DG Chest 2 View  Result Date: 11/04/2022 CLINICAL DATA:  Chest pain for 3 days. Pain began after starting antibiotics. Frequent burping. Nausea, vomiting and shortness of breath. EXAM: CHEST - 2 VIEW COMPARISON:  05/30/2021 FINDINGS: Stable cardiac enlargement. Aortic atherosclerosis. Increased lung volumes. Chronic interstitial coarsening. No pleural effusion,  interstitial edema or airspace consolidation. Right breast calcifications project over the periphery of the right lower lung. No acute osseous findings. IMPRESSION: 1. No acute cardiopulmonary disease. 2. Cardiomegaly. 3.  Aortic Atherosclerosis (ICD10-I70.0). Electronically Signed   By: Signa Kell M.D.   On: 11/04/2022 16:44    Procedures Procedures    Medications Ordered in ED Medications  dalbavancin (DALVANCE) 1,500 mg in dextrose 5 % 500 mL IVPB (1,500 mg Intravenous Not Given 11/04/22 2325)  sucralfate (CARAFATE) 1 GM/10ML suspension 1 g (1 g Oral Given 11/04/22 2329)  famotidine (PEPCID) IVPB 20 mg premix (0 mg Intravenous Stopped 11/04/22 1654)  alum & mag hydroxide-simeth (MAALOX/MYLANTA) 200-200-20 MG/5ML suspension 30 mL (30 mLs Oral Given 11/04/22 1535)  lidocaine (XYLOCAINE) 2 % viscous mouth solution 15 mL (15 mLs Mouth/Throat Given 11/04/22 1533)  ondansetron (ZOFRAN) injection 4 mg (4 mg Intravenous Given 11/04/22 2329)    ED Course/ Medical Decision Making/ A&P Clinical Course as of 11/04/22 2339  Sat Nov 04, 2022  2204 Awaiting IV antibiotic - patient had some improvement of odynophagia with the GI cocktail, reports he gotten some water  [MT]    Clinical Course User Index [MT] Cirilo Canner, Kermit Balo, MD                                 Medical Decision Making Amount and/or Complexity of Data Reviewed Labs: ordered. Radiology: ordered.  Risk OTC drugs. Prescription drug management. Decision regarding hospitalization.   This patient presents to the ED with concern for epigastric discomfort, odynophagia. This involves an extensive number of treatment options, and is a complaint that carries with it a high risk of complications and morbidity.  The differential diagnosis includes pill esophagitis vs reflux/gastritis vs pancreatitis vs other  Co-morbidities that complicate the patient evaluation: hx of multiple antibiotic allergies- limits PO treatment options  External  records from outside source obtained and reviewed including hospital discharge summary this week  I ordered and personally interpreted labs.  The pertinent results include: No emergent findings  I ordered imaging studies including dg chest I independently visualized and interpreted imaging which showed no emergent  findings I agree with the radiologist interpretation  The patient was maintained on a cardiac monitor.  I personally viewed and interpreted the cardiac monitored which showed an underlying rhythm of: A Fib rate controlled  Per my interpretation the patient's ECG shows A Fib (chronic) with no acute ischemic findings  I ordered medication including IV pepcid, GI cocktail for esophagitis.  IV dalbavancin for 1-time dose MRSA coverage.  We will need to discontinue doxycycline which may have caused her esophagitis.  With her allergies and intolerances to sulfa, keflex, and clindamycin, I don't think there are many other viable options to cover for potential MRSA, and we discussed dalbavancin infusion in the ED. This medication was ordered.  However, unfortunately there were significant delays with the medication being sent.  Reportedly it was picked up by the courier according to the [facility] pharmacist but was never delivered after multiple hours of waiting, and there is no follow-up or contact information available for the courier.  During this prolonged wait, the patient's symptoms gradually worsen, and she reported inability to swallow water, worsening epigastric discomfort.  We have discussed at this point medical readmission for suspected pill esophagitis.  I would encourage the hospitalist to consider giving IV dalbavancin as an inpatient dose, and we will also treat with Carafate and Zofran for nausea and potential esophagitis type symptoms.      Dispostion:  After consideration of the diagnostic results and the patients response to treatment, I feel that the patent would benefit  from medical admission.         Final Clinical Impression(s) / ED Diagnoses Final diagnoses:  Pill esophagitis    Rx / DC Orders ED Discharge Orders          Ordered    Ambulatory referral to Infectious Disease       Comments: Cellulitis patient:  Received dalbavancin on 11/04/2022.   11/04/22 1526              Terald Sleeper, MD 11/04/22 2339

## 2022-11-04 NOTE — ED Notes (Signed)
Patient transported to X-ray via wheelchair with tech. 

## 2022-11-04 NOTE — ED Notes (Signed)
MD Trifan at bedside speaking with patient. Antibiotic still not delivered at this time.

## 2022-11-04 NOTE — ED Provider Notes (Signed)
  Provider Note MRN:  [id number]  Arrival date & time: 11/04/22    ED Course and Medical Decision Making  Assumed care from Dr. Renaye Rakers at shift change.  Pill esophagitis and also his cellulitis, difficulty tolerating oral antibiotics at home, requesting admission.  Procedures  Final Clinical Impressions(s) / ED Diagnoses     ICD-10-CM   1. Pill esophagitis  K20.80    T50.905A       ED Discharge Orders          Ordered    Ambulatory referral to Infectious Disease       Comments: Cellulitis patient:  Received dalbavancin on 11/04/2022.   11/04/22 1526            Discharge Instructions   None     Elmer Sow. Pilar Plate, MD [facility] [email or web address]    Sabas Sous, MD 11/05/22 [phone]

## 2022-11-04 NOTE — ED Notes (Signed)
Patient brought back to room from X-ray via wheelchair.

## 2022-11-04 NOTE — ED Notes (Signed)
Per pharmacy, antibiotic en route at this time. Patient and family aware.

## 2022-11-04 NOTE — ED Notes (Signed)
Patient resting quietly in stretcher, respirations even, unlabored, no acute distress noted. Ambulatory to restroom with walker. Aware we are awaiting delivery of antibiotic.

## 2022-11-05 ENCOUNTER — Encounter (HOSPITAL_COMMUNITY): Payer: Self-pay | Admitting: Internal Medicine

## 2022-11-05 DIAGNOSIS — E039 Hypothyroidism, unspecified: Secondary | ICD-10-CM | POA: Diagnosis present

## 2022-11-05 DIAGNOSIS — Z6836 Body mass index (BMI) 36.0-36.9, adult: Secondary | ICD-10-CM | POA: Diagnosis not present

## 2022-11-05 DIAGNOSIS — I428 Other cardiomyopathies: Secondary | ICD-10-CM | POA: Diagnosis present

## 2022-11-05 DIAGNOSIS — L03115 Cellulitis of right lower limb: Secondary | ICD-10-CM | POA: Diagnosis present

## 2022-11-05 DIAGNOSIS — I130 Hypertensive heart and chronic kidney disease with heart failure and stage 1 through stage 4 chronic kidney disease, or unspecified chronic kidney disease: Secondary | ICD-10-CM | POA: Diagnosis present

## 2022-11-05 DIAGNOSIS — N179 Acute kidney failure, unspecified: Secondary | ICD-10-CM | POA: Diagnosis present

## 2022-11-05 DIAGNOSIS — I2510 Atherosclerotic heart disease of native coronary artery without angina pectoris: Secondary | ICD-10-CM | POA: Diagnosis present

## 2022-11-05 DIAGNOSIS — R1310 Dysphagia, unspecified: Secondary | ICD-10-CM | POA: Diagnosis present

## 2022-11-05 DIAGNOSIS — I700 Atherosclerosis of aorta: Secondary | ICD-10-CM | POA: Diagnosis present

## 2022-11-05 DIAGNOSIS — T462X5A Adverse effect of other antidysrhythmic drugs, initial encounter: Secondary | ICD-10-CM | POA: Diagnosis present

## 2022-11-05 DIAGNOSIS — E782 Mixed hyperlipidemia: Secondary | ICD-10-CM | POA: Diagnosis present

## 2022-11-05 DIAGNOSIS — N1832 Chronic kidney disease, stage 3b: Secondary | ICD-10-CM | POA: Diagnosis present

## 2022-11-05 DIAGNOSIS — K208 Other esophagitis without bleeding: Secondary | ICD-10-CM | POA: Diagnosis present

## 2022-11-05 DIAGNOSIS — T364X5A Adverse effect of tetracyclines, initial encounter: Secondary | ICD-10-CM | POA: Diagnosis present

## 2022-11-05 DIAGNOSIS — D649 Anemia, unspecified: Secondary | ICD-10-CM | POA: Diagnosis present

## 2022-11-05 DIAGNOSIS — I482 Chronic atrial fibrillation, unspecified: Secondary | ICD-10-CM | POA: Diagnosis present

## 2022-11-05 DIAGNOSIS — I959 Hypotension, unspecified: Secondary | ICD-10-CM | POA: Diagnosis present

## 2022-11-05 DIAGNOSIS — E1122 Type 2 diabetes mellitus with diabetic chronic kidney disease: Secondary | ICD-10-CM | POA: Diagnosis present

## 2022-11-05 DIAGNOSIS — I10 Essential (primary) hypertension: Secondary | ICD-10-CM | POA: Diagnosis not present

## 2022-11-05 DIAGNOSIS — E66812 Obesity, class 2: Secondary | ICD-10-CM | POA: Diagnosis present

## 2022-11-05 DIAGNOSIS — Z794 Long term (current) use of insulin: Secondary | ICD-10-CM | POA: Diagnosis not present

## 2022-11-05 DIAGNOSIS — E1169 Type 2 diabetes mellitus with other specified complication: Secondary | ICD-10-CM | POA: Diagnosis not present

## 2022-11-05 DIAGNOSIS — G2581 Restless legs syndrome: Secondary | ICD-10-CM | POA: Diagnosis present

## 2022-11-05 DIAGNOSIS — R4701 Aphasia: Secondary | ICD-10-CM | POA: Diagnosis present

## 2022-11-05 DIAGNOSIS — R0789 Other chest pain: Secondary | ICD-10-CM | POA: Diagnosis present

## 2022-11-05 DIAGNOSIS — K219 Gastro-esophageal reflux disease without esophagitis: Secondary | ICD-10-CM | POA: Diagnosis not present

## 2022-11-05 DIAGNOSIS — L03119 Cellulitis of unspecified part of limb: Secondary | ICD-10-CM | POA: Diagnosis present

## 2022-11-05 DIAGNOSIS — G894 Chronic pain syndrome: Secondary | ICD-10-CM | POA: Diagnosis present

## 2022-11-05 DIAGNOSIS — K2100 Gastro-esophageal reflux disease with esophagitis, without bleeding: Secondary | ICD-10-CM | POA: Diagnosis present

## 2022-11-05 DIAGNOSIS — I5042 Chronic combined systolic (congestive) and diastolic (congestive) heart failure: Secondary | ICD-10-CM | POA: Diagnosis present

## 2022-11-05 LAB — CBC WITH DIFFERENTIAL/PLATELET
Abs Immature Granulocytes: 0.03 10*3/uL (ref 0.00–0.07)
Basophils Absolute: 0 10*3/uL (ref 0.0–0.1)
Basophils Relative: 0 %
Eosinophils Absolute: 0.2 10*3/uL (ref 0.0–0.5)
Eosinophils Relative: 3 %
HCT: 35.6 % — ABNORMAL LOW (ref 36.0–46.0)
Hemoglobin: 11.5 g/dL — ABNORMAL LOW (ref 12.0–15.0)
Immature Granulocytes: 0 %
Lymphocytes Relative: 16 %
Lymphs Abs: 1.4 10*3/uL (ref 0.7–4.0)
MCH: 31.1 pg (ref 26.0–34.0)
MCHC: 32.3 g/dL (ref 30.0–36.0)
MCV: 96.2 fL (ref 80.0–100.0)
Monocytes Absolute: 0.5 10*3/uL (ref 0.1–1.0)
Monocytes Relative: 6 %
Neutro Abs: 6.8 10*3/uL (ref 1.7–7.7)
Neutrophils Relative %: 75 %
Platelets: 227 10*3/uL (ref 150–400)
RBC: 3.7 MIL/uL — ABNORMAL LOW (ref 3.87–5.11)
RDW: 12.8 % (ref 11.5–15.5)
WBC: 9 10*3/uL (ref 4.0–10.5)
nRBC: 0 % (ref 0.0–0.2)

## 2022-11-05 LAB — COMPREHENSIVE METABOLIC PANEL
ALT: 17 U/L (ref 0–44)
AST: 22 U/L (ref 15–41)
Albumin: 3.5 g/dL (ref 3.5–5.0)
Alkaline Phosphatase: 91 U/L (ref 38–126)
Anion gap: 11 (ref 5–15)
BUN: 24 mg/dL — ABNORMAL HIGH (ref 8–23)
CO2: 30 mmol/L (ref 22–32)
Calcium: 9.1 mg/dL (ref 8.9–10.3)
Chloride: 96 mmol/L — ABNORMAL LOW (ref 98–111)
Creatinine, Ser: 1.31 mg/dL — ABNORMAL HIGH (ref 0.44–1.00)
GFR, Estimated: 39 mL/min — ABNORMAL LOW (ref >60)
Glucose, Bld: 167 mg/dL — ABNORMAL HIGH (ref 70–99)
Potassium: 3.8 mmol/L (ref 3.5–5.1)
Sodium: 137 mmol/L (ref 135–145)
Total Bilirubin: 1.1 mg/dL (ref 0.3–1.2)
Total Protein: 7.4 g/dL (ref 6.5–8.1)

## 2022-11-05 LAB — GLUCOSE, CAPILLARY
Glucose-Capillary: 140 mg/dL — ABNORMAL HIGH (ref 70–99)
Glucose-Capillary: 156 mg/dL — ABNORMAL HIGH (ref 70–99)
Glucose-Capillary: 158 mg/dL — ABNORMAL HIGH (ref 70–99)
Glucose-Capillary: 154 mg/dL — ABNORMAL HIGH (ref 70–99)

## 2022-11-05 LAB — PHOSPHORUS: Phosphorus: 3.7 mg/dL (ref 2.5–4.6)

## 2022-11-05 LAB — TSH: TSH: 1.389 u[IU]/mL (ref 0.350–4.500)

## 2022-11-05 LAB — MAGNESIUM: Magnesium: 1.7 mg/dL (ref 1.7–2.4)

## 2022-11-05 MED ORDER — LORAZEPAM 0.5 MG PO TABS
0.5000 mg | ORAL_TABLET | Freq: Every evening | ORAL | Status: AC | PRN
  Administered 2022-11-05: 0.5 mg via ORAL
  Filled 2022-11-05: qty 1

## 2022-11-05 MED ORDER — DEXTROSE 5 % IV SOLN
1500.0000 mg | Freq: Once | INTRAVENOUS | Status: AC
  Administered 2022-11-05: 1500 mg via INTRAVENOUS

## 2022-11-05 MED ORDER — EZETIMIBE 10 MG PO TABS
10.0000 mg | ORAL_TABLET | Freq: Every day | ORAL | Status: DC
  Administered 2022-11-05 – 2022-11-07 (×3): 10 mg via ORAL
  Filled 2022-11-05 (×3): qty 1

## 2022-11-05 MED ORDER — FAMOTIDINE IN NACL 20-0.9 MG/50ML-% IV SOLN
20.0000 mg | Freq: Once | INTRAVENOUS | Status: AC
  Administered 2022-11-05: 20 mg via INTRAVENOUS
  Filled 2022-11-05: qty 50

## 2022-11-05 MED ORDER — MAGNESIUM OXIDE -MG SUPPLEMENT 400 (240 MG) MG PO TABS
400.0000 mg | ORAL_TABLET | Freq: Every day | ORAL | Status: DC
  Administered 2022-11-06 – 2022-11-07 (×2): 400 mg via ORAL
  Filled 2022-11-05 (×2): qty 1

## 2022-11-05 MED ORDER — MAGNESIUM SULFATE 2 GM/50ML IV SOLN
2.0000 g | Freq: Once | INTRAVENOUS | Status: AC
  Administered 2022-11-05: 2 g via INTRAVENOUS
  Filled 2022-11-05: qty 50

## 2022-11-05 MED ORDER — ACETAMINOPHEN 325 MG PO TABS: 650.0000 mg | ORAL_TABLET | Freq: Four times a day (QID) | ORAL | Status: DC | PRN

## 2022-11-05 MED ORDER — LEVOTHYROXINE SODIUM 25 MCG PO TABS
25.0000 ug | ORAL_TABLET | Freq: Every day | ORAL | Status: DC
  Administered 2022-11-06 – 2022-11-07 (×2): 25 ug via ORAL
  Filled 2022-11-05 (×2): qty 1

## 2022-11-05 MED ORDER — ORAL CARE MOUTH RINSE: 15.0000 mL | OROMUCOSAL | Status: DC | PRN

## 2022-11-05 MED ORDER — ONDANSETRON HCL 4 MG PO TABS: 4.0000 mg | ORAL_TABLET | Freq: Four times a day (QID) | ORAL | Status: DC | PRN

## 2022-11-05 MED ORDER — LIDOCAINE VISCOUS HCL 2 % MT SOLN: 15.0000 mL | OROMUCOSAL | Status: DC | PRN

## 2022-11-05 MED ORDER — INSULIN GLARGINE-YFGN 100 UNIT/ML ~~LOC~~ SOLN
18.0000 [IU] | Freq: Every day | SUBCUTANEOUS | Status: DC
  Administered 2022-11-05 – 2022-11-06 (×2): 18 [IU] via SUBCUTANEOUS
  Filled 2022-11-05 (×3): qty 0.18

## 2022-11-05 MED ORDER — INSULIN ASPART 100 UNIT/ML IJ SOLN
0.0000 [IU] | Freq: Three times a day (TID) | INTRAMUSCULAR | Status: DC
  Administered 2022-11-06 (×3): 1 [IU] via SUBCUTANEOUS

## 2022-11-05 MED ORDER — LORAZEPAM 0.5 MG PO TABS: 0.5000 mg | ORAL_TABLET | Freq: Every evening | ORAL | Status: DC | PRN

## 2022-11-05 MED ORDER — MORPHINE SULFATE (PF) 4 MG/ML IV SOLN
4.0000 mg | Freq: Once | INTRAVENOUS | Status: AC
  Administered 2022-11-05: 4 mg via INTRAVENOUS
  Filled 2022-11-05: qty 1

## 2022-11-05 MED ORDER — SODIUM CHLORIDE 0.9 % IV SOLN: INTRAVENOUS | Status: DC | PRN

## 2022-11-05 MED ORDER — ACETAMINOPHEN 650 MG RE SUPP: 650.0000 mg | Freq: Four times a day (QID) | RECTAL | Status: DC | PRN

## 2022-11-05 MED ORDER — PANTOPRAZOLE SODIUM 40 MG IV SOLR
40.0000 mg | Freq: Once | INTRAVENOUS | Status: AC
  Administered 2022-11-05: 40 mg via INTRAVENOUS
  Filled 2022-11-05: qty 10

## 2022-11-05 MED ORDER — FLAVOXATE HCL 100 MG PO TABS: 100.0000 mg | ORAL_TABLET | Freq: Two times a day (BID) | ORAL | Status: DC | PRN

## 2022-11-05 MED ORDER — ALUM & MAG HYDROXIDE-SIMETH 200-200-20 MG/5ML PO SUSP: 30.0000 mL | ORAL | Status: DC | PRN

## 2022-11-05 MED ORDER — MELATONIN 3 MG PO TABS
3.0000 mg | ORAL_TABLET | Freq: Every evening | ORAL | Status: DC | PRN
  Filled 2022-11-05: qty 1

## 2022-11-05 MED ORDER — ONDANSETRON HCL 4 MG/2ML IJ SOLN: 4.0000 mg | Freq: Four times a day (QID) | INTRAMUSCULAR | Status: DC | PRN

## 2022-11-05 MED ORDER — POTASSIUM CHLORIDE IN NACL 40-0.9 MEQ/L-% IV SOLN
INTRAVENOUS | Status: AC
  Filled 2022-11-05: qty 1000

## 2022-11-05 NOTE — ED Notes (Signed)
Tracy Mann with cl aware for transport

## 2022-11-05 NOTE — ED Notes (Signed)
I have just given report to J.T. RN at [facility]; and I also have given report to Joni Reining, RN with [facility]. Pt. Is comfortably sleeping as I write this.

## 2022-11-05 NOTE — Consult Note (Signed)
Reason for Consult: Odynophagia dysphagia Referring Physician: Hospital team  Tracy Mann is an 89 y.o. female.  HPI: Patient seen and examined and her hospital computer chart reviewed and her case discussed with the hospital team and she looks better than her story sounds and is in good spirits and says her problem started yesterday morning after she took her antibiotic and went right to bed and did not take it with food and woke up with painful swallowing and some difficulty swallowing and it seems to be a little better today and she may have had a similar problem years ago and may have had an endoscopy and talk about a dilation years ago but none anytime recently and she is on a blood thinner and has no other complaints and actually did not like swallowing the clear liquids and she is missing salt in her food and wants to try soft solids  Past Medical History:  Diagnosis Date   Acute myocardial infarction of inferior wall (HCC)    a. 10/2005 - Presumed to be 2/2 to a coronary embolus in setting of persistent Afib-->Cath 2014 relatively nl cors, EF 45-50% w/ severe distal inferior/apical inferior HK w/ aneursymal appearance.   Arthritis    Chronic anticoagulation    a. warfarin.   Chronic atrial fibrillation (HCC)    a. CHA2DS2VASc = 6--> warfarin.   Chronic combined systolic and diastolic CHF (congestive heart failure) (HCC)    a. 10/2012 Echo: EF 35-40%, diff HK, mild MR, mild biatrial enlargement;  b. 11/2012 EF 45-50% by LV gram; c. echo 07/2014: EF 40-45%, mod conc LVH, mod MR, severe biatrial enlargement, PASP 45 mm Hg c. Echo 01/2018 EF 45-50%, duffuse hypokinesis, mild MR, LA mod dilated, norm RVSF, dilated IVC   Essential hypertension    Hyperlipidemia, mixed    Mixed Ischemic/Nonischemic Cardiomyopathy    a. 10/2012 Echo: EF 35-40%;  b. 11/2012 EF 45-50% by LV gram.   Nonobstructive coronary artery disease    a. 2007 inferior wall MI thought to be secondary to thrombus from atrial  fib b. 2014 cath with R dominant coronary arterial system with mild luminal irregularities c. Myoview 05/2015 old inferior MI, no new ischemic changes   Obesity    Thyroid disease    hypothyroidism   Type II diabetes mellitus (HCC)    a. 07/2018 A1c 8.7 - long term insulin use   Venous insufficiency    Vitamin D deficiency     Past Surgical History:  Procedure Laterality Date   CARDIAC CATHETERIZATION  11/2012   [facility];EF 45-50%   CHOLECYSTECTOMY  1999   TUBAL LIGATION  1968   VESICOVAGINAL FISTULA CLOSURE W/ TAH  1996    Family History  Problem Relation Age of Onset   Heart disease Mother    Heart attack Mother    Thyroid disease Father    Heart disease Brother    Heart disease Brother     Social History:  reports that she has never smoked. She has never used smokeless tobacco. She reports that she does not drink alcohol and does not use drugs.  Allergies:  Allergies  Allergen Reactions   Clindamycin/Lincomycin Shortness Of Breath and Swelling   Duloxetine Other (See Comments)    Same as Paroxetine   Paroxetine Other (See Comments)    "Horrible headache," Stomach pain, Diarrhea   Sulfa Antibiotics Anaphylaxis   Cephalexin Other (See Comments)    Blisters   Clarithromycin Other (See Comments)    Hives, headaches,  hard time swelling, felt like throat was closing up  Hives, headaches, hard time swelling, felt like throat was closing up    Hives, headaches, hard time swelling, felt like throat was closing up Hives, headaches, hard time swelling, felt like throat was closing up   Diphenhydramine Other (See Comments)   Estrogens Conjugated    Estrogens Conjugated    Nitrofurantoin Nausea Only    Medications: I have reviewed the patient's current medications.  Results for orders placed or performed during the hospital encounter of 11/04/22 (from the past 48 hour(s))  Basic metabolic panel     Status: Abnormal   Collection Time: 11/04/22  3:15 PM  Result Value Ref Range    Sodium 140 135 - 145 mmol/L   Potassium 3.8 3.5 - 5.1 mmol/L   Chloride 98 98 - 111 mmol/L   CO2 31 22 - 32 mmol/L   Glucose, Bld 151 (H) 70 - 99 mg/dL    Comment: Glucose reference range applies only to samples taken after fasting for at least 8 hours.   BUN 27 (H) 8 - 23 mg/dL   Creatinine, Ser 1.30 (H) 0.44 - 1.00 mg/dL   Calcium 86.5 8.9 - 78.4 mg/dL   GFR, Estimated 46 (L) >60 mL/min    Comment: (NOTE) Calculated using the CKD-EPI Creatinine Equation (2021)    Anion gap 11 5 - 15    Comment: Performed at [facility], [address] [place], [place] [address]  Troponin I (High Sensitivity)     Status: Abnormal   Collection Time: 11/04/22  3:15 PM  Result Value Ref Range   Troponin I (High Sensitivity) 25 (H) <18 ng/L    Comment: (NOTE) Elevated high sensitivity troponin I (hsTnI) values and significant  changes across serial measurements may suggest ACS but many other  chronic and acute conditions are known to elevate hsTnI results.  Refer to the "Links" section for chest pain algorithms and additional  guidance. Performed at [facility], [address] [place], [place] [address]   CBC with Differential     Status: Abnormal   Collection Time: 11/04/22  3:15 PM  Result Value Ref Range   WBC 10.2 4.0 - 10.5 K/uL   RBC 3.67 (L) 3.87 - 5.11 MIL/uL   Hemoglobin 11.4 (L) 12.0 - 15.0 g/dL   HCT 32.4 (L) 40.1 - 02.7 %   MCV 93.2 80.0 - 100.0 fL   MCH 31.1 26.0 - 34.0 pg   MCHC 33.3 30.0 - 36.0 g/dL   RDW 25.3 66.4 - 40.3 %   Platelets 215 150 - 400 K/uL   nRBC 0.0 0.0 - 0.2 %   Neutrophils Relative % 71 %   Neutro Abs 7.1 1.7 - 7.7 K/uL   Lymphocytes Relative 19 %   Lymphs Abs 1.9 0.7 - 4.0 K/uL   Monocytes Relative 8 %   Monocytes Absolute 0.8 0.1 - 1.0 K/uL   Eosinophils Relative 2 %   Eosinophils Absolute 0.2 0.0 - 0.5 K/uL   Basophils Relative 0 %   Basophils Absolute 0.0 0.0 - 0.1 K/uL   Immature Granulocytes 0 %    Abs Immature Granulocytes 0.03 0.00 - 0.07 K/uL    Comment: Performed at [facility], [address] [place], [place] [address]  Troponin I (High Sensitivity)     Status: Abnormal   Collection Time: 11/04/22  5:14 PM  Result Value Ref Range   Troponin I (High Sensitivity) 22 (H) <18 ng/L  Comment: (NOTE) Elevated high sensitivity troponin I (hsTnI) values and significant  changes across serial measurements may suggest ACS but many other  chronic and acute conditions are known to elevate hsTnI results.  Refer to the "Links" section for chest pain algorithms and additional  guidance. Performed at [facility], [address] [place], [place] [address]   Comprehensive metabolic panel     Status: Abnormal   Collection Time: 11/05/22  9:29 AM  Result Value Ref Range   Sodium 137 135 - 145 mmol/L   Potassium 3.8 3.5 - 5.1 mmol/L   Chloride 96 (L) 98 - 111 mmol/L   CO2 30 22 - 32 mmol/L   Glucose, Bld 167 (H) 70 - 99 mg/dL    Comment: Glucose reference range applies only to samples taken after fasting for at least 8 hours.   BUN 24 (H) 8 - 23 mg/dL   Creatinine, Ser 8.29 (H) 0.44 - 1.00 mg/dL   Calcium 9.1 8.9 - 56.2 mg/dL   Total Protein 7.4 6.5 - 8.1 g/dL   Albumin 3.5 3.5 - 5.0 g/dL   AST 22 15 - 41 U/L   ALT 17 0 - 44 U/L   Alkaline Phosphatase 91 38 - 126 U/L   Total Bilirubin 1.1 0.3 - 1.2 mg/dL   GFR, Estimated 39 (L) >60 mL/min    Comment: (NOTE) Calculated using the CKD-EPI Creatinine Equation (2021)    Anion gap 11 5 - 15    Comment: Performed at [facility], [address] [place], [place] [address]  CBC with Differential/Platelet     Status: Abnormal   Collection Time: 11/05/22  9:29 AM  Result Value Ref Range   WBC 9.0 4.0 - 10.5 K/uL   RBC 3.70 (L) 3.87 - 5.11 MIL/uL   Hemoglobin 11.5 (L) 12.0 - 15.0 g/dL   HCT 57.8 (L) 46.9 - 62.9 %   MCV 96.2 80.0 - 100.0 fL   MCH 31.1 26.0 - 34.0 pg   MCHC 32.3  30.0 - 36.0 g/dL   RDW 52.8 41.3 - 24.4 %   Platelets 227 150 - 400 K/uL   nRBC 0.0 0.0 - 0.2 %   Neutrophils Relative % 75 %   Neutro Abs 6.8 1.7 - 7.7 K/uL   Lymphocytes Relative 16 %   Lymphs Abs 1.4 0.7 - 4.0 K/uL   Monocytes Relative 6 %   Monocytes Absolute 0.5 0.1 - 1.0 K/uL   Eosinophils Relative 3 %   Eosinophils Absolute 0.2 0.0 - 0.5 K/uL   Basophils Relative 0 %   Basophils Absolute 0.0 0.0 - 0.1 K/uL   Immature Granulocytes 0 %   Abs Immature Granulocytes 0.03 0.00 - 0.07 K/uL    Comment: Performed at [facility], [address] [place], [place] [address]  Magnesium     Status: None   Collection Time: 11/05/22  9:29 AM  Result Value Ref Range   Magnesium 1.7 1.7 - 2.4 mg/dL    Comment: Performed at [facility], [address] [place], [place] [address]  Phosphorus     Status: None   Collection Time: 11/05/22  9:29 AM  Result Value Ref Range   Phosphorus 3.7 2.5 - 4.6 mg/dL    Comment: Performed at [facility], [address], [place], [place] [address]  Glucose, capillary     Status: Abnormal   Collection Time: 11/05/22 10:04 AM  Result Value Ref Range  Glucose-Capillary 140 (H) 70 - 99 mg/dL    Comment: Glucose reference range applies only to samples taken after fasting for at least 8 hours.    DG Chest 2 View  Result Date: 11/04/2022 CLINICAL DATA:  Chest pain for 3 days. Pain began after starting antibiotics. Frequent burping. Nausea, vomiting and shortness of breath. EXAM: CHEST - 2 VIEW COMPARISON:  05/30/2021 FINDINGS: Stable cardiac enlargement. Aortic atherosclerosis. Increased lung volumes. Chronic interstitial coarsening. No pleural effusion, interstitial edema or airspace consolidation. Right breast calcifications project over the periphery of the right lower lung. No acute osseous findings. IMPRESSION: 1. No acute cardiopulmonary disease. 2. Cardiomegaly. 3.  Aortic Atherosclerosis  (ICD10-I70.0). Electronically Signed   By: Signa Kell M.D.   On: 11/04/2022 16:44    ROS negative except above except for her cellulitis Blood pressure (!) 94/58, pulse 96, temperature 98.1 F (36.7 C), resp. rate 19, height 5\' 3"  (1.6 m), weight 93 kg, SpO2 96%. Physical Exam vital signs stable afebrile no acute distress in good spirits abdomen is soft nontender labs reviewed all stable chest x-ray fine no other new x-rays  Assessment/Plan: A dyne aphasia dysphagia I believe you have the correct diagnosis and pill esophagitis Plan: Will allow soft per patient request however if worsens can go back to clear and continue present orders and consider holding blood thinner in case endoscopy is needed although I think that would be unlikely based on how she looks and I will ask my partner Dr. Marca Ancona to check on tomorrow but hopefully she can go home soon and this problem will be short-lived and could even consider a barium swallow with barium tablet as well  Valecia Beske E 11/05/2022, 12:42 PM

## 2022-11-05 NOTE — Progress Notes (Signed)
Hospitalist Transfer Note:    Nursing staff, Please call [facility] Admits & Consults System-Wide number on Amion [phone]) as soon as patient's arrival, so appropriate admitting provider can evaluate the pt.  Transferring facility: [facility] Requesting provider: Dr. Pilar Plate (EDP at [facility]) Reason for transfer: admission for further evaluation and management of right lower extremity cellulitis as well as potential pill esophagitis.    89  year old female who presented to [facility] ED complaining of  persistent/progressive right lower extremity erythema.  She was recently diagnosed with cellulitis of the right lower extremity, which has not improved with outpatient use of doxycycline, and conveys allergies to multiple other antibiotic classes, limiting other abx options.  Additionally, she has developed odynophagia, dysphagia, which was felt to be concerning for potential pill esophagitis in the setting of recent use of doxycycline, further complicating her ability to take oral antibiotics as an outpatient is management of her right lower extremity cellulitis.    Subsequently, I accepted this patient for transfer for inpatient admission to a med-tele bed at [facility] or [facility]  (first available) for further work-up and management of the above.      Newton Pigg, DO Hospitalist

## 2022-11-05 NOTE — H&P (Signed)
History and Physical    Patient: Tracy Mann GMW:[id number] DOB: [birth year] DOA: 11/04/2022 DOS: the patient was seen and examined on 11/05/2022 PCP: Bosie Clos, MD  Patient coming from: Home  Chief Complaint:  Chief Complaint  Patient presents with   Chest Pain   HPI: Tracy Mann is a 89 y.o. female with medical history significant of CAD, history of MI, osteoarthritis, chronic atrial fibrillation on apixaban, chronic combined systolic and diastolic heart failure due to mixed ischemic/nonischemic cardiomyopathy, hypertension, mixed hyperlipidemia, class II obesity, hypothyroidism, type 2 diabetes, venous insufficiency, lymphedema, vitamin D deficiency who was recently admitted and discharged from [facility] due to right lower extremity cellulitis and right hand rash with cellulitic changes as well (see pictures below) from 10/30/2022 until 11/01/2022 treated with vancomycin and prescribed oral doxycycline who is coming to the emergency department due to progressively worse chest pain with odynophagia for the past 3 days.  The patient stated in triage "it feels like I have a rock in my esophagus."  Despite this, she feels like her right hand and lower extremity are getting better.  She denied fever, chills, rhinorrhea, sore throat, wheezing or hemoptysis.  Positive atypical CP with odynophagia, but no palpitations, diaphoresis, PND, orthopnea, but occasionally develops pitting edema of the lower extremities.  She last took torsemide 40 mg.  Mild epigastric abdominal pain, nausea, but no emesis, diarrhea, constipation, melena or hematochezia.  No flank pain, dysuria, frequency or hematuria.  No polyuria, polydipsia, polyphagia or blurred vision.   Lab work: CBC showed white count of 10.2, hemoglobin 11.4 g/dL platelets 440.  Troponin was 25 and 22 ng/L.  BMP showed normal electrolytes, glucose 151, BUN 27 and creatinine 1.16 mg/dL.  Imaging: 2 view chest radiograph showing stable cardiomegaly,  aortic atherosclerosis, increased lung volumes, chronic interstitial coarsening, but no pleural effusion interstitial edema or airspace consolidation.  Right breast calcifications project over the periphery of the right lower lung.  No acute osseous findings.  ED course: Initial vital signs were temperature 98 F, pulse 97, respirations 16, BP 141/111 mmHg O2 sat 94% on room air.  The patient received morphine 4 mg IVP, ondansetron 4 mg IVP, famotidine 20 mg IVP, 15 mL of viscous lidocaine, started on Carafate liquid 1 g before meals and bedtime and 30 mL of Maalox.  On arrival to the hospital, I ordered famotidine 20 mg IVP, pantoprazole 40 mg IVP, restarted viscous lidocaine and Maalox as needed.   Review of Systems: As mentioned in the history of present illness. All other systems reviewed and are negative. Past Medical History:  Diagnosis Date   Acute myocardial infarction of inferior wall (HCC)    a. 10/2005 - Presumed to be 2/2 to a coronary embolus in setting of persistent Afib-->Cath 2014 relatively nl cors, EF 45-50% w/ severe distal inferior/apical inferior HK w/ aneursymal appearance.   Arthritis    Chronic anticoagulation    a. warfarin.   Chronic atrial fibrillation (HCC)    a. CHA2DS2VASc = 6--> warfarin.   Chronic combined systolic and diastolic CHF (congestive heart failure) (HCC)    a. 10/2012 Echo: EF 35-40%, diff HK, mild MR, mild biatrial enlargement;  b. 11/2012 EF 45-50% by LV gram; c. echo 07/2014: EF 40-45%, mod conc LVH, mod MR, severe biatrial enlargement, PASP 45 mm Hg c. Echo 01/2018 EF 45-50%, duffuse hypokinesis, mild MR, LA mod dilated, norm RVSF, dilated IVC   Essential hypertension    Hyperlipidemia, mixed    Mixed Ischemic/Nonischemic Cardiomyopathy  a. 10/2012 Echo: EF 35-40%;  b. 11/2012 EF 45-50% by LV gram.   Nonobstructive coronary artery disease    a. 2007 inferior wall MI thought to be secondary to thrombus from atrial fib b. 2014 cath with R dominant  coronary arterial system with mild luminal irregularities c. Myoview 05/2015 old inferior MI, no new ischemic changes   Obesity    Thyroid disease    hypothyroidism   Type II diabetes mellitus (HCC)    a. 07/2018 A1c 8.7 - long term insulin use   Venous insufficiency    Vitamin D deficiency    Past Surgical History:  Procedure Laterality Date   CARDIAC CATHETERIZATION  11/2012   [facility];EF 45-50%   CHOLECYSTECTOMY  1999   TUBAL LIGATION  1968   VESICOVAGINAL FISTULA CLOSURE W/ TAH  1996   Social History:  reports that she has never smoked. She has never used smokeless tobacco. She reports that she does not drink alcohol and does not use drugs.  Allergies  Allergen Reactions   Clindamycin/Lincomycin Shortness Of Breath and Swelling   Duloxetine Other (See Comments)    Same as Paroxetine   Paroxetine Other (See Comments)    "Horrible headache," Stomach pain, Diarrhea   Sulfa Antibiotics Anaphylaxis   Cephalexin Other (See Comments)    Blisters   Clarithromycin Other (See Comments)    Hives, headaches, hard time swelling, felt like throat was closing up  Hives, headaches, hard time swelling, felt like throat was closing up    Hives, headaches, hard time swelling, felt like throat was closing up Hives, headaches, hard time swelling, felt like throat was closing up   Diphenhydramine Other (See Comments)   Estrogens Conjugated    Estrogens Conjugated    Nitrofurantoin Nausea Only    Family History  Problem Relation Age of Onset   Heart disease Mother    Heart attack Mother    Thyroid disease Father    Heart disease Brother    Heart disease Brother     Prior to Admission medications   Medication Sig Start Date End Date Taking? Authorizing Provider  albuterol (VENTOLIN HFA) 108 (90 Base) MCG/ACT inhaler TAKE 2 PUFFS BY MOUTH EVERY 6 HOURS AS NEEDED FOR WHEEZE OR SHORTNESS OF BREATH 03/19/20   Bosie Clos, MD  apixaban (ELIQUIS) 5 MG TABS tablet Take 1 tablet (5 mg total)  by mouth 2 (two) times daily. 03/20/22   Antonieta Iba, MD  doxycycline (VIBRA-TABS) 100 MG tablet Take 1 tablet (100 mg total) by mouth every 12 (twelve) hours for 6 days. 11/01/22 11/07/22  Enedina Finner, MD  ezetimibe (ZETIA) 10 MG tablet Take 1 tablet (10 mg total) by mouth daily. 07/26/21   Bosie Clos, MD  flavoxATE (URISPAS) 100 MG tablet Take 1 tablet (100 mg total) by mouth 2 (two) times daily as needed for bladder spasms. 10/17/21   MacDiarmid, Lorin Picket, MD  fluticasone (FLONASE) 50 MCG/ACT nasal spray Place 2 sprays into both nostrils daily. 01/06/22   Alfredia Ferguson, PA-C  HYDROcodone-acetaminophen (NORCO) 10-325 MG tablet Take 1 tablet by mouth every 8 (eight) hours as needed for severe pain. Must last 30 days. 10/23/22 11/22/22  Delano Metz, MD  HYDROcodone-acetaminophen (NORCO) 10-325 MG tablet Take 1 tablet by mouth every 8 (eight) hours as needed for severe pain. Must last 30 days. 11/22/22 12/22/22  Delano Metz, MD  HYDROcodone-acetaminophen (NORCO) 10-325 MG tablet Take 1 tablet by mouth every 8 (eight) hours as needed for severe pain. Must  last 30 days. 12/22/22 01/21/23  Delano Metz, MD  insulin glargine (LANTUS SOLOSTAR) 100 UNIT/ML Solostar Pen Inject 24 Units into the skin at bedtime. 05/20/19   [provider]  insulin lispro (HUMALOG) 100 UNIT/ML KwikPen Inject 10-25 Units into the skin in the morning, at noon, and at bedtime. 10/12/20   [provider]  Insulin Pen Needle (RELION PEN NEEDLE 31G/8MM) 31G X 8 MM MISC Use with insulin pen three times daily 03/24/19   Bosie Clos, MD  levothyroxine (SYNTHROID) 25 MCG tablet Take 25 mcg by mouth daily before breakfast.    [provider]  lisinopril (ZESTRIL) 10 MG tablet Take 1 tablet (10 mg total) by mouth daily. 06/20/22   Antonieta Iba, MD  LORazepam (ATIVAN) 0.5 MG tablet Take 0.5 mg by mouth 2 (two) times daily. 10/19/22   [provider]  Magnesium 500 MG CAPS  Take 1,000 mg by mouth daily.     [provider]  metolazone (ZAROXOLYN) 2.5 MG tablet Take 1 tablet (2.5 mg total) by mouth as directed. Take two times a week with torsemide 40 mg 06/20/22   Antonieta Iba, MD  mupirocin cream (BACTROBAN) 2 % Apply topically 3 (three) times daily. 11/01/22   Enedina Finner, MD  naloxone [facility]) nasal spray 4 mg/0.1 mL Place 1 spray into the nose as needed for up to 365 doses (for opioid-induced respiratory depresssion). In case of emergency (overdose), spray once into each nostril. If no response within 3 minutes, repeat application and call 911. 10/18/22 10/18/23  Delano Metz, MD  nitrofurantoin (MACRODANTIN) 50 MG capsule Take 50 mg by mouth at bedtime. 04/25/22 04/25/23  [provider]  nitroGLYCERIN (NITROSTAT) 0.4 MG SL tablet Place 1 tablet (0.4 mg total) under the tongue every 5 (five) minutes as needed for chest pain. 01/04/21   Antonieta Iba, MD  NONFORMULARY OR COMPOUNDED ITEM See pharmacy note- Not to use on any open skin or wound. 10/06/21   Bosie Clos, MD  ondansetron (ZOFRAN) 4 MG tablet Take 1 tablet (4 mg total) by mouth every 12 (twelve) hours as needed for nausea or vomiting. 07/26/21   Bosie Clos, MD  pantoprazole (PROTONIX) 40 MG tablet Take 40 mg by mouth as needed.    [provider]  phenazopyridine (PYRIDIUM) 95 MG tablet Take 95 mg by mouth 3 (three) times daily as needed for pain.    [provider]  rOPINIRole (REQUIP) 0.25 MG tablet TAKE 1 TABLET BY MOUTH EVERYDAY AT BEDTIME 07/26/21   Bosie Clos, MD  torsemide (DEMADEX) 20 MG tablet Take 2 tablets (40 mg total) by mouth daily. 06/20/22   Antonieta Iba, MD    Physical Exam: Vitals:   11/05/22 4782 11/05/22 0730 11/05/22 0854 11/05/22 0856  BP:  110/63 (!) 94/58   Pulse:  75 (!) 106 96  Resp:  13 19   Temp: 98.8 F (37.1 C)  98.1 F (36.7 C)   TempSrc: Oral     SpO2:  94% 96%   Weight:      Height:       Physical  Exam Vitals and nursing note reviewed.  Constitutional:      General: She is awake. She is not in acute distress.    Appearance: She is well-developed. She is obese.  HENT:     Head: Normocephalic.     Nose: No rhinorrhea.     Mouth/Throat:     Mouth: Mucous membranes are  moist.  Eyes:     General: No scleral icterus.    Extraocular Movements: Extraocular movements intact.     Pupils: Pupils are equal, round, and reactive to light.  Neck:     Vascular: No JVD.  Cardiovascular:     Rate and Rhythm: Tachycardia present. Rhythm irregularly irregular.     Heart sounds: S1 normal and S2 normal.  Pulmonary:     Breath sounds: No wheezing, rhonchi or rales.  Abdominal:     Palpations: Abdomen is soft.  Musculoskeletal:     Cervical back: Neck supple.     Right lower leg: No edema.     Left lower leg: No edema.  Skin:    Findings: Erythema and rash present.     Comments: Right hand healing cellulitic rash with decreased edema and erythema when compared to earlier this week.  RLE mid to lower pretibial area ulcer with surrounding erythema, edema, and mildly TTP.  Recently dressed.  No discharge seen.  (The patient thinks that this is getting better).  Neurological:     General: No focal deficit present.     Mental Status: She is alert.  Psychiatric:        Mood and Affect: Mood normal.        Behavior: Behavior is cooperative.    Images from 10/31/2022          Images from 11-01-2022.      Data Reviewed:  Results are pending, will review when available.  05/18/2021 IMPRESSIONS    1. Left ventricular ejection fraction, by estimation, is 30 to 35%. The  left ventricle has moderate to severely decreased function. The left  ventricle demonstrates global hypokinesis. The left ventricular internal  cavity size was mildly dilated. Left  ventricular diastolic parameters are indeterminate.   2. Right ventricular systolic function is low normal. The right  ventricular  size is moderately enlarged. There is moderately elevated  pulmonary artery systolic pressure. The estimated right ventricular  systolic pressure is 56.0 mmHg.   3. Left atrial size was severely dilated.   4. Right atrial size was severely dilated.   5. The mitral valve is degenerative. Moderate to severe mitral valve  regurgitation.   6. Tricuspid valve regurgitation is mild to moderate.   7. The aortic valve was not well visualized. Aortic valve regurgitation  is not visualized.   8. The inferior vena cava is dilated in size with <50% respiratory  variability, suggesting right atrial pressure of 15 mmHg.   EKG: Vent. rate 91 BPM PR interval * ms QRS duration 98 ms QT/QTcB 370/455 ms P-R-T axes * -69 80 Atrial fibrillation Left axis deviation Anterolateral infarct (cited on or before 29-Jun-2021) Abnormal ECG  Assessment and Plan: Principal Problem:   Cellulitis of right lower extremity Telemetry/inpatient. Analgesics as needed as BP allows. Consult wound care team. Discussed with Dr. Renold Don who is on-call for ID. -Patient received a dose of dalbavancin in ED. (Salvage dose with long half-life with 2-week oxygen) -No further antibiotics recommended at the moment. -Patient to see see Dr Alvester Morin in 7-10 days. -Will need appointment confirmation with ID clinic before discharge.  Active Problems:   Pill esophagitis due to tetracycline Superimposed on:   Acid reflux Clear liquid diet. Antiemetics as needed. Continue Maalox plus viscous lidocaine. Continue liquid Carafate AC/HS. Famotidine 20 mg IVP x 1. Pantoprazole 40 mg IVP x 1. -Resume oral pantoprazole in AM. Discussed with Dr. Ewing Schlein. -Will be seen in consult.  Chronic atrial fibrillation (HCC) CHA?DS?-VASc score of 7. Having episodes of RVR in the100s to130's. No precordial pressure or palpitations. No dyspnea at this time. Hypotensive in the high 90s. -Hold diuretics and ACE inhibitor. -Optimize magnesium  and potassium. -Hold apixaban pending GI evaluation.    CAD, NATIVE VESSEL On apixaban and antihyperlipidemic. Unable to tolerate low-dose beta-blocker.    Essential hypertension Hold antihypertensives. Time-limited IV hydration.    CKD stage 3b, GFR 30-44 ml/min (HCC) Creatinine is slightly increased from yesterday. GFR down from 46 to 39 mm/min. Currently hypotensive. Hold torsemide and lisinopril. Time-limited IV hydration. Monitor renal function/electrolytes.    Chronic pain syndrome Hold narcotics due to hypotension.    Hyperlipidemia   Aortic atherosclerosis (HCC)  Continue ezetimibe 10 mg p.o. daily.    Hypothyroidism Continue levothyroxine 25 mcg p.o. daily.    Restless legs syndrome Off ropinirole at the moment.    Diabetes mellitus (HCC) Carbohydrate modified diet. Decrease QHS Lantus to 18 units. CBG monitoring with RI SS. Recent hemoglobin A1c 6.7%.    Advance Care Planning:   Code Status: Full Code   Consults: [facility] Vida Rigger MD).  Family Communication:   Severity of Illness: The appropriate patient status for this patient is INPATIENT. Inpatient status is judged to be reasonable and necessary in order to provide the required intensity of service to ensure the patient's safety. The patient's presenting symptoms, physical exam findings, and initial radiographic and laboratory data in the context of their chronic comorbidities is felt to place them at high risk for further clinical deterioration. Furthermore, it is not anticipated that the patient will be medically stable for discharge from the hospital within 2 midnights of admission.   * I certify that at the point of admission it is my clinical judgment that the patient will require inpatient hospital care spanning beyond 2 midnights from the point of admission due to high intensity of service, high risk for further deterioration and high frequency of surveillance required.*  Author: Bobette Mo, MD 11/05/2022 9:01 AM  For on call review [email or web address].   This document was prepared using Dragon voice recognition software and may contain some unintended transcription errors.

## 2022-11-06 ENCOUNTER — Inpatient Hospital Stay (HOSPITAL_COMMUNITY): Payer: Medicare Other

## 2022-11-06 DIAGNOSIS — I482 Chronic atrial fibrillation, unspecified: Secondary | ICD-10-CM

## 2022-11-06 DIAGNOSIS — K208 Other esophagitis without bleeding: Secondary | ICD-10-CM

## 2022-11-06 DIAGNOSIS — K219 Gastro-esophageal reflux disease without esophagitis: Secondary | ICD-10-CM

## 2022-11-06 DIAGNOSIS — I10 Essential (primary) hypertension: Secondary | ICD-10-CM

## 2022-11-06 DIAGNOSIS — N1832 Chronic kidney disease, stage 3b: Secondary | ICD-10-CM

## 2022-11-06 DIAGNOSIS — T462X5A Adverse effect of other antidysrhythmic drugs, initial encounter: Secondary | ICD-10-CM

## 2022-11-06 DIAGNOSIS — E039 Hypothyroidism, unspecified: Secondary | ICD-10-CM

## 2022-11-06 DIAGNOSIS — L03115 Cellulitis of right lower limb: Secondary | ICD-10-CM | POA: Diagnosis not present

## 2022-11-06 DIAGNOSIS — E1169 Type 2 diabetes mellitus with other specified complication: Secondary | ICD-10-CM

## 2022-11-06 LAB — CBC
HCT: 30.5 % — ABNORMAL LOW (ref 36.0–46.0)
Hemoglobin: 9.7 g/dL — ABNORMAL LOW (ref 12.0–15.0)
MCH: 31.2 pg (ref 26.0–34.0)
MCHC: 31.8 g/dL (ref 30.0–36.0)
MCV: 98.1 fL (ref 80.0–100.0)
Platelets: 203 10*3/uL (ref 150–400)
RBC: 3.11 MIL/uL — ABNORMAL LOW (ref 3.87–5.11)
RDW: 12.8 % (ref 11.5–15.5)
WBC: 9.8 10*3/uL (ref 4.0–10.5)
nRBC: 0 % (ref 0.0–0.2)

## 2022-11-06 LAB — URINALYSIS, ROUTINE W REFLEX MICROSCOPIC
Bilirubin Urine: NEGATIVE
Glucose, UA: NEGATIVE mg/dL
Hgb urine dipstick: NEGATIVE
Ketones, ur: NEGATIVE mg/dL
Nitrite: NEGATIVE
Protein, ur: NEGATIVE mg/dL
Specific Gravity, Urine: 1.016 (ref 1.005–1.030)
pH: 5 (ref 5.0–8.0)

## 2022-11-06 LAB — GLUCOSE, CAPILLARY
Glucose-Capillary: 135 mg/dL — ABNORMAL HIGH (ref 70–99)
Glucose-Capillary: 137 mg/dL — ABNORMAL HIGH (ref 70–99)
Glucose-Capillary: 141 mg/dL — ABNORMAL HIGH (ref 70–99)
Glucose-Capillary: 169 mg/dL — ABNORMAL HIGH (ref 70–99)

## 2022-11-06 LAB — COMPREHENSIVE METABOLIC PANEL
ALT: 18 U/L (ref 0–44)
AST: 23 U/L (ref 15–41)
Albumin: 3.2 g/dL — ABNORMAL LOW (ref 3.5–5.0)
Alkaline Phosphatase: 80 U/L (ref 38–126)
Anion gap: 9 (ref 5–15)
BUN: 31 mg/dL — ABNORMAL HIGH (ref 8–23)
CO2: 27 mmol/L (ref 22–32)
Calcium: 8.3 mg/dL — ABNORMAL LOW (ref 8.9–10.3)
Chloride: 101 mmol/L (ref 98–111)
Creatinine, Ser: 2.03 mg/dL — ABNORMAL HIGH (ref 0.44–1.00)
GFR, Estimated: 23 mL/min — ABNORMAL LOW (ref >60)
Glucose, Bld: 135 mg/dL — ABNORMAL HIGH (ref 70–99)
Potassium: 4 mmol/L (ref 3.5–5.1)
Sodium: 137 mmol/L (ref 135–145)
Total Bilirubin: 0.8 mg/dL (ref 0.3–1.2)
Total Protein: 6.5 g/dL (ref 6.5–8.1)

## 2022-11-06 LAB — IRON AND TIBC
Iron: 36 ug/dL (ref 28–170)
Saturation Ratios: 13 % (ref 10.4–31.8)
TIBC: 279 ug/dL (ref 250–450)
UIBC: 243 ug/dL

## 2022-11-06 LAB — VITAMIN B12: Vitamin B-12: 231 pg/mL (ref 180–914)

## 2022-11-06 LAB — RETICULOCYTES
Immature Retic Fract: 7.1 % (ref 2.3–15.9)
RBC.: 3.2 MIL/uL — ABNORMAL LOW (ref 3.87–5.11)
Retic Count, Absolute: 61.8 10*3/uL (ref 19.0–186.0)
Retic Ct Pct: 1.9 % (ref 0.4–3.1)

## 2022-11-06 LAB — FERRITIN: Ferritin: 282 ng/mL (ref 11–307)

## 2022-11-06 LAB — FOLATE: Folate: 13.8 ng/mL (ref >5.9)

## 2022-11-06 MED ORDER — LACTATED RINGERS IV BOLUS
1000.0000 mL | Freq: Once | INTRAVENOUS | Status: AC
  Administered 2022-11-06: 1000 mL via INTRAVENOUS

## 2022-11-06 MED ORDER — HEPARIN SODIUM (PORCINE) 5000 UNIT/ML IJ SOLN
5000.0000 [IU] | Freq: Three times a day (TID) | INTRAMUSCULAR | Status: DC
  Administered 2022-11-06 – 2022-11-07 (×3): 5000 [IU] via SUBCUTANEOUS
  Filled 2022-11-06 (×3): qty 1

## 2022-11-06 MED ORDER — LORAZEPAM 0.5 MG PO TABS
0.5000 mg | ORAL_TABLET | Freq: Every evening | ORAL | Status: AC | PRN
  Administered 2022-11-07: 0.5 mg via ORAL
  Filled 2022-11-06: qty 1

## 2022-11-06 MED ORDER — MUPIROCIN CALCIUM 2 % EX CREA
TOPICAL_CREAM | Freq: Every day | CUTANEOUS | Status: DC
  Filled 2022-11-06: qty 15

## 2022-11-06 MED ORDER — PANTOPRAZOLE SODIUM 40 MG PO TBEC
40.0000 mg | DELAYED_RELEASE_TABLET | Freq: Two times a day (BID) | ORAL | Status: DC
  Administered 2022-11-06 – 2022-11-07 (×2): 40 mg via ORAL
  Filled 2022-11-06 (×2): qty 1

## 2022-11-06 NOTE — TOC CM/SW Note (Signed)
Transition of Care [facility]) - Inpatient Brief Assessment   Patient Details  Name: Tracy Mann MRN: [id number] Date of Birth: 14-Aug-1933  Transition of [facility]) CM/SW Contact:    Darleene Cleaver, LCSW Phone Number: 11/06/2022, 5:40 PM   Clinical Narrative:  CSW spoke with patient she does not have any SDOH needs, and plans to return back home once she is medically ready for discharge from the hospital.  No anticipated TOC needs.  Transition of Care Asessment: Insurance and Status: Insurance coverage has been reviewed Patient has primary care physician: Yes Home environment has been reviewed: Yes Prior level of function:: Independent at home. Prior/Current Home Services: No current home services Social Determinants of Health Reivew: SDOH reviewed no interventions necessary Readmission risk has been reviewed: Yes Transition of care needs: no transition of care needs at this time

## 2022-11-06 NOTE — Progress Notes (Signed)
Subjective: Patient states she had some chicken with vegetables yesterday. She had pancakes and sausage today morning. She feels that it is harder for need to go down, otherwise she does not have any nausea, vomiting or sensation of food getting stuck.  Objective: Vital signs in last 24 hours: Temp:  [98.6 F (37 C)-99.1 F (37.3 C)] 98.7 F (37.1 C) (10/07 0526) Pulse Rate:  [63-97] 63 (10/07 0526) Resp:  [14-17] 16 (10/07 0526) BP: (94-140)/(50-78) 94/50 (10/07 0526) SpO2:  [92 %-99 %] 93 % (10/07 0526) Weight change:  Last BM Date : 11/04/22  PE: Elderly, pleasant GENERAL: Awake, oriented x 3  ABDOMEN: Soft, nondistended, nontender EXTREMITIES: No deformity  Lab Results: Results for orders placed or performed during the hospital encounter of 11/04/22 (from the past 48 hour(s))  Basic metabolic panel     Status: Abnormal   Collection Time: 11/04/22  3:15 PM  Result Value Ref Range   Sodium 140 135 - 145 mmol/L   Potassium 3.8 3.5 - 5.1 mmol/L   Chloride 98 98 - 111 mmol/L   CO2 31 22 - 32 mmol/L   Glucose, Bld 151 (H) 70 - 99 mg/dL    Comment: Glucose reference range applies only to samples taken after fasting for at least 8 hours.   BUN 27 (H) 8 - 23 mg/dL   Creatinine, Ser 2.53 (H) 0.44 - 1.00 mg/dL   Calcium 66.4 8.9 - 40.3 mg/dL   GFR, Estimated 46 (L) >60 mL/min    Comment: (NOTE) Calculated using the CKD-EPI Creatinine Equation (2021)    Anion gap 11 5 - 15    Comment: Performed at [facility], [address] [place], [place] [address]  Troponin I (High Sensitivity)     Status: Abnormal   Collection Time: 11/04/22  3:15 PM  Result Value Ref Range   Troponin I (High Sensitivity) 25 (H) <18 ng/L    Comment: (NOTE) Elevated high sensitivity troponin I (hsTnI) values and significant  changes across serial measurements may suggest ACS but many other  chronic and acute conditions are known to elevate hsTnI results.  Refer to the "Links"  section for chest pain algorithms and additional  guidance. Performed at [facility], [address], [place], [place] [address]   CBC with Differential     Status: Abnormal   Collection Time: 11/04/22  3:15 PM  Result Value Ref Range   WBC 10.2 4.0 - 10.5 K/uL   RBC 3.67 (L) 3.87 - 5.11 MIL/uL   Hemoglobin 11.4 (L) 12.0 - 15.0 g/dL   HCT 75.6 (L) 43.3 - 29.5 %   MCV 93.2 80.0 - 100.0 fL   MCH 31.1 26.0 - 34.0 pg   MCHC 33.3 30.0 - 36.0 g/dL   RDW 18.8 41.6 - 60.6 %   Platelets 215 150 - 400 K/uL   nRBC 0.0 0.0 - 0.2 %   Neutrophils Relative % 71 %   Neutro Abs 7.1 1.7 - 7.7 K/uL   Lymphocytes Relative 19 %   Lymphs Abs 1.9 0.7 - 4.0 K/uL   Monocytes Relative 8 %   Monocytes Absolute 0.8 0.1 - 1.0 K/uL   Eosinophils Relative 2 %   Eosinophils Absolute 0.2 0.0 - 0.5 K/uL   Basophils Relative 0 %   Basophils Absolute 0.0 0.0 - 0.1 K/uL   Immature Granulocytes 0 %   Abs Immature Granulocytes 0.03 0.00 - 0.07 K/uL    Comment: Performed at [facility], [phone]  [address], [place], [place] [address]  Troponin I (High Sensitivity)     Status: Abnormal   Collection Time: 11/04/22  5:14 PM  Result Value Ref Range   Troponin I (High Sensitivity) 22 (H) <18 ng/L    Comment: (NOTE) Elevated high sensitivity troponin I (hsTnI) values and significant  changes across serial measurements may suggest ACS but many other  chronic and acute conditions are known to elevate hsTnI results.  Refer to the "Links" section for chest pain algorithms and additional  guidance. Performed at [facility], [address] [place], [place] [address]   Comprehensive metabolic panel     Status: Abnormal   Collection Time: 11/05/22  9:29 AM  Result Value Ref Range   Sodium 137 135 - 145 mmol/L   Potassium 3.8 3.5 - 5.1 mmol/L   Chloride 96 (L) 98 - 111 mmol/L   CO2 30 22 - 32 mmol/L   Glucose, Bld 167 (H) 70 - 99 mg/dL    Comment: Glucose  reference range applies only to samples taken after fasting for at least 8 hours.   BUN 24 (H) 8 - 23 mg/dL   Creatinine, Ser 0.98 (H) 0.44 - 1.00 mg/dL   Calcium 9.1 8.9 - 11.9 mg/dL   Total Protein 7.4 6.5 - 8.1 g/dL   Albumin 3.5 3.5 - 5.0 g/dL   AST 22 15 - 41 U/L   ALT 17 0 - 44 U/L   Alkaline Phosphatase 91 38 - 126 U/L   Total Bilirubin 1.1 0.3 - 1.2 mg/dL   GFR, Estimated 39 (L) >60 mL/min    Comment: (NOTE) Calculated using the CKD-EPI Creatinine Equation (2021)    Anion gap 11 5 - 15    Comment: Performed at [facility], [address], [place], [place] [address]  CBC with Differential/Platelet     Status: Abnormal   Collection Time: 11/05/22  9:29 AM  Result Value Ref Range   WBC 9.0 4.0 - 10.5 K/uL   RBC 3.70 (L) 3.87 - 5.11 MIL/uL   Hemoglobin 11.5 (L) 12.0 - 15.0 g/dL   HCT 95.6 (L) 21.3 - 08.6 %   MCV 96.2 80.0 - 100.0 fL   MCH 31.1 26.0 - 34.0 pg   MCHC 32.3 30.0 - 36.0 g/dL   RDW 57.8 46.9 - 62.9 %   Platelets 227 150 - 400 K/uL   nRBC 0.0 0.0 - 0.2 %   Neutrophils Relative % 75 %   Neutro Abs 6.8 1.7 - 7.7 K/uL   Lymphocytes Relative 16 %   Lymphs Abs 1.4 0.7 - 4.0 K/uL   Monocytes Relative 6 %   Monocytes Absolute 0.5 0.1 - 1.0 K/uL   Eosinophils Relative 3 %   Eosinophils Absolute 0.2 0.0 - 0.5 K/uL   Basophils Relative 0 %   Basophils Absolute 0.0 0.0 - 0.1 K/uL   Immature Granulocytes 0 %   Abs Immature Granulocytes 0.03 0.00 - 0.07 K/uL    Comment: Performed at [facility], [address] [place], [place] [address]  Magnesium     Status: None   Collection Time: 11/05/22  9:29 AM  Result Value Ref Range   Magnesium 1.7 1.7 - 2.4 mg/dL    Comment: Performed at [facility], [address] [place], [place] [address]  Phosphorus     Status: None   Collection Time: 11/05/22  9:29 AM  Result Value Ref Range   Phosphorus 3.7 2.5 -  4.6 mg/dL    Comment: Performed at [facility], [address] [place], [place] [address]  Glucose, capillary     Status: Abnormal   Collection Time: 11/05/22 10:04 AM  Result Value Ref Range   Glucose-Capillary 140 (H) 70 - 99 mg/dL    Comment: Glucose reference range applies only to samples taken after fasting for at least 8 hours.  TSH     Status: None   Collection Time: 11/05/22 11:47 AM  Result Value Ref Range   TSH 1.389 0.350 - 4.500 uIU/mL    Comment: Performed by a 3rd Generation assay with a functional sensitivity of <=0.01 uIU/mL. Performed at [facility], [address], [place], [place] [address]   Glucose, capillary     Status: Abnormal   Collection Time: 11/05/22  1:04 PM  Result Value Ref Range   Glucose-Capillary 156 (H) 70 - 99 mg/dL    Comment: Glucose reference range applies only to samples taken after fasting for at least 8 hours.  Glucose, capillary     Status: Abnormal   Collection Time: 11/05/22  4:55 PM  Result Value Ref Range   Glucose-Capillary 158 (H) 70 - 99 mg/dL    Comment: Glucose reference range applies only to samples taken after fasting for at least 8 hours.  Glucose, capillary     Status: Abnormal   Collection Time: 11/05/22  9:45 PM  Result Value Ref Range   Glucose-Capillary 154 (H) 70 - 99 mg/dL    Comment: Glucose reference range applies only to samples taken after fasting for at least 8 hours.  CBC     Status: Abnormal   Collection Time: 11/06/22  3:32 AM  Result Value Ref Range   WBC 9.8 4.0 - 10.5 K/uL   RBC 3.11 (L) 3.87 - 5.11 MIL/uL   Hemoglobin 9.7 (L) 12.0 - 15.0 g/dL   HCT 82.9 (L) 56.2 - 13.0 %   MCV 98.1 80.0 - 100.0 fL   MCH 31.2 26.0 - 34.0 pg   MCHC 31.8 30.0 - 36.0 g/dL   RDW 86.5 78.4 - 69.6 %   Platelets 203 150 - 400 K/uL   nRBC 0.0 0.0 - 0.2 %    Comment: Performed at [facility], [address], [place], [place] [address]  Comprehensive metabolic panel     Status: Abnormal   Collection Time: 11/06/22  3:32 AM   Result Value Ref Range   Sodium 137 135 - 145 mmol/L   Potassium 4.0 3.5 - 5.1 mmol/L   Chloride 101 98 - 111 mmol/L   CO2 27 22 - 32 mmol/L   Glucose, Bld 135 (H) 70 - 99 mg/dL    Comment: Glucose reference range applies only to samples taken after fasting for at least 8 hours.   BUN 31 (H) 8 - 23 mg/dL   Creatinine, Ser 4.13 (H) 0.44 - 1.00 mg/dL   Calcium 8.3 (L) 8.9 - 10.3 mg/dL   Total Protein 6.5 6.5 - 8.1 g/dL   Albumin 3.2 (L) 3.5 - 5.0 g/dL   AST 23 15 - 41 U/L   ALT 18 0 - 44 U/L   Alkaline Phosphatase 80 38 - 126 U/L   Total Bilirubin 0.8 0.3 - 1.2 mg/dL   GFR, Estimated 23 (L) >60 mL/min    Comment: (NOTE) Calculated using the CKD-EPI Creatinine Equation (2021)    Anion gap 9 5 - 15    Comment: Performed at [facility], [address], [place], [place] [address]  Glucose, capillary     Status: Abnormal   Collection Time: 11/06/22  7:44 AM  Result Value Ref Range   Glucose-Capillary 135 (H) 70 - 99 mg/dL    Comment: Glucose reference range applies only to samples taken after fasting for at least 8 hours.    Studies/Results: DG Chest 2 View  Result Date: 11/04/2022 CLINICAL DATA:  Chest pain for 3 days. Pain began after starting antibiotics. Frequent burping. Nausea, vomiting and shortness of breath. EXAM: CHEST - 2 VIEW COMPARISON:  05/30/2021 FINDINGS: Stable cardiac enlargement. Aortic atherosclerosis. Increased lung volumes. Chronic interstitial coarsening. No pleural effusion, interstitial edema or airspace consolidation. Right breast calcifications project over the periphery of the right lower lung. No acute osseous findings. IMPRESSION: 1. No acute cardiopulmonary disease. 2. Cardiomegaly. 3.  Aortic Atherosclerosis (ICD10-I70.0). Electronically Signed   By: Signa Kell M.D.   On: 11/04/2022 16:44    Medications: I have reviewed the patient's current medications.  Assessment: Odynophagia, presumed pill induced  esophagitis(doxycycline)  Cellulitis of right lower extremity Renal impairment, BUN 31, creatinine 2.03, GFR 23 Normocytic anemia, hemoglobin 9.7  Multiple comorbidities: Coronary artery disease, history of MI, osteoarthritis, atrial fibrillation on Eliquis, chronic combined systolic and diastolic heart failure, ischemic and nonischemic cardiomyopathy, hypertension, dyslipidemia, obesity, hypothyroidism, type 2 diabetes, venous insufficiency, lymphedema, vitamin D deficiency  Plan: Able to tolerate soft diet. Continue sucralfate 1 g suspension 4 times a day. Have added pantoprazole 40 mg twice a day. If clinically she continues to improve, recommend PPI twice daily for 1 month and sucralfate 4 times a day for 2 weeks. If clinically she has worsening symptoms, we may have to consider EGD in the future. Discussed the same with the patient, who agrees with conservative management.  Kerin Salen, MD 11/06/2022, 11:41 AM

## 2022-11-06 NOTE — Consult Note (Addendum)
WOC Nurse Consult Note: Reason for Consult: Consult requested for right leg wound.  Pt is familiar to [facility] team from recent consult on 10/1. Wound type: Chronic full thickness wound to right anterior calf, 5X3X.2cm, 50% red, 50% yellow, mod amt tan drainage, scattered red moist partial thickness wounds surrounding the edges.  Dressing procedure/placement/frequency: Topical treatment orders provided for bedside nurses to perform as follows to promote moist healing: Apply Bactroban to right leg Q day, then cover with foam dressing.  Change foam dressing Q 3 days or PRN soiling. Please re-consult if further assistance is needed.  Thank-you,  Cammie Mcgee MSN, RN, CWOCN, [place], CNS [phone]

## 2022-11-06 NOTE — Progress Notes (Signed)
[facility]                                                                               [name], is a 89 y.o. female, DOB - 10-Nov-1933, [id number] Admit date - 11/04/2022    Outpatient Primary MD for the patient is No primary care provider on file.  LOS - 1  days    Brief summary    Tracy Mann is a 89 y.o. female with medical history significant of CAD, history of MI, osteoarthritis, chronic atrial fibrillation on apixaban, chronic combined systolic and diastolic heart failure due to mixed ischemic/nonischemic cardiomyopathy, hypertension, mixed hyperlipidemia, class II obesity, hypothyroidism, type 2 diabetes, venous insufficiency, lymphedema, vitamin D deficiency who was recently admitted and discharged from [facility] due to right lower extremity cellulitis and right hand rash with cellulitic changes as well (see pictures below) from 10/30/2022 until 11/01/2022 treated with vancomycin and prescribed oral doxycycline who is coming to the emergency department due to progressively worse chest pain with odynophagia for the past 3 days.  The patient stated in triage "it feels like I have a rock in my esophagus."   Assessment & Plan    Assessment and Plan:   Cellulitis of right lower extremity Discussed with Dr. Renold Don who is on-call for ID. -Patient received a dose of dalbavancin in ED. (Salvage dose with long half-life with 2-week oxygen) -No further antibiotics recommended at the moment. -Patient to see see Dr Rivka Safer in 7-10 days. - will set up appt prior to discharge.    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  GI consulted and recommendations given.  Currently on soft diet.  GI recommends to continue with sucralfate, protonix, for 1 month an sucralfate for 2 weeks. If she worsens will need a EGD in the future.      Chronic atrial fibrillation (HCC) CHA?DS?-VASc score of 7. Rate better controlled.  Eliquis on hold  for possible GI evaluation.  If she continues to improve will start eliquis in am.      CAD, NATIVE VESSEL On apixaban and antihyperlipidemic. Unable to tolerate low-dose beta-blocker.     Essential hypertension Well controlled.       Aki  on CKD stage 3b, GFR 30-44 ml/min (HCC) Creatinine is slightly increased from yesterday. GFR down from 46 to 39 mm/min. Currently hypotensive. Hold torsemide and lisinopril. 1 lit of fluid bolus ordered.  Recheck BP and renal parameters in am.       Chronic pain syndrome Hold narcotics due to hypotension.     Hyperlipidemia   Aortic atherosclerosis (HCC)  Continue ezetimibe 10 mg p.o. daily.     Hypothyroidism Continue levothyroxine 25 mcg p.o. daily.     Restless legs syndrome Off ropinirole at the moment.     Diabetes mellitus (HCC) type 2 inusiln dependent. Well controlled.  Carbohydrate modified diet. Decrease QHS Lantus to 18 units. Recent hemoglobin A1c 6.7%. CBG (last 3)  Recent Labs    11/06/22 0744 11/06/22 1144 11/06/22 1646  GLUCAP 135* 137* 141*   Resume SSI.   Estimated body mass index is 36.31 kg/m as calculated from the following:  Height as of this encounter: 5\' 3"  (1.6 m).   Weight as of this encounter: 93 kg.  Code Status: full code.  DVT Prophylaxis:  heparin injection 5,000 Units Start: 11/06/22 1400   Level of Care: Level of care: Telemetry Family Communication: none at bedside.   Disposition Plan:     Remains inpatient appropriate:  pending improvement.  Pending therapy evauation.   Procedures:  None.   Consultants:   ID curb side.  Gastroenterology.   Antimicrobials:   Anti-infectives (From admission, onward)    Start     Dose/Rate Route Frequency Ordered Stop   11/05/22 0630  dalbavancin (DALVANCE) 1,500 mg in dextrose 5 % 500 mL IVPB        1,500 mg 967.7 mL/hr over 31 Minutes Intravenous Once 11/05/22 0629 11/05/22 0705   11/04/22 1530  dalbavancin (DALVANCE) 1,500 mg in dextrose  5 % 500 mL IVPB  Status:  Discontinued        1,500 mg 967.7 mL/hr over 31 Minutes Intravenous Once 11/04/22 1526 11/05/22 0124        Medications  Scheduled Meds:  ezetimibe  10 mg Oral Daily   heparin injection (subcutaneous)  5,000 Units Subcutaneous Q8H   insulin aspart  0-9 Units Subcutaneous TID WC   insulin glargine-yfgn  18 Units Subcutaneous QHS   levothyroxine  25 mcg Oral Q0600   magnesium oxide  400 mg Oral Daily   mupirocin cream   Topical Daily   pantoprazole  40 mg Oral BID AC   sucralfate  1 g Oral TID WC & HS   Continuous Infusions:  sodium chloride Stopped (11/05/22 1300)   lactated ringers     PRN Meds:.sodium chloride, acetaminophen **OR** acetaminophen, alum & mag hydroxide-simeth **AND** lidocaine, flavoxATE, ondansetron **OR** ondansetron (ZOFRAN) IV, mouth rinse    Subjective:   Tracy Mann was seen and examined today.  No new complaints.   Objective:   Vitals:   11/05/22 1308 11/05/22 1701 11/05/22 2148 11/06/22 0526  BP: (!) 140/78 (!) 104/59 (!) 130/52 (!) 94/50  Pulse: 95 97 81 63  Resp: 14 15 17 16   Temp: 99 F (37.2 C) 99.1 F (37.3 C) 98.6 F (37 C) 98.7 F (37.1 C)  TempSrc: Oral  Oral Oral  SpO2: 99% 97% 92% 93%  Weight:      Height:        Intake/Output Summary (Last 24 hours) at 11/06/2022 1310 Last data filed at 11/06/2022 0600 Gross per 24 hour  Intake 1433.2 ml  Output --  Net 1433.2 ml   Filed Weights   11/04/22 1445  Weight: 93 kg     Exam General exam: Appears calm and comfortable  Respiratory system: Clear to auscultation. Respiratory effort normal. Cardiovascular system: S1 & S2 heard, irregularly irregular,  Gastrointestinal system: Abdomen is nondistended, soft and nontender.  Central nervous system: Alert and oriented. . Extremities: Symmetric 5 x 5 power. Skin:  RLE  Chronic full thickness wound to right anterior calf, 5X3X.2cm, bandaged.  Psychiatry: Mood & affect appropriate.     Data Reviewed:   I have personally reviewed following labs and imaging studies   CBC Lab Results  Component Value Date   WBC 9.8 11/06/2022   RBC 3.20 (L) 11/06/2022   HGB 9.7 (L) 11/06/2022   HCT 30.5 (L) 11/06/2022   MCV 98.1 11/06/2022   MCH 31.2 11/06/2022   PLT 203 11/06/2022   MCHC 31.8 11/06/2022   RDW 12.8 11/06/2022   LYMPHSABS 1.4  11/05/2022   MONOABS 0.5 11/05/2022   EOSABS 0.2 11/05/2022   BASOSABS 0.0 11/05/2022     Last metabolic panel Lab Results  Component Value Date   NA 137 11/06/2022   K 4.0 11/06/2022   CL 101 11/06/2022   CO2 27 11/06/2022   BUN 31 (H) 11/06/2022   CREATININE 2.03 (H) 11/06/2022   GLUCOSE 135 (H) 11/06/2022   GFRNONAA 23 (L) 11/06/2022   GFRAA 73 12/31/2019   CALCIUM 8.3 (L) 11/06/2022   PHOS 3.7 11/05/2022   PROT 6.5 11/06/2022   ALBUMIN 3.2 (L) 11/06/2022   LABGLOB 2.6 03/15/2022   AGRATIO 1.6 03/15/2022   BILITOT 0.8 11/06/2022   ALKPHOS 80 11/06/2022   AST 23 11/06/2022   ALT 18 11/06/2022   ANIONGAP 9 11/06/2022    CBG (last 3)  Recent Labs    11/05/22 2145 11/06/22 0744 11/06/22 1144  GLUCAP 154* 135* 137*      Coagulation Profile: No results for input(s): "INR", "PROTIME" in the last 168 hours.   Radiology Studies: DG Chest 2 View  Result Date: 11/04/2022 CLINICAL DATA:  Chest pain for 3 days. Pain began after starting antibiotics. Frequent burping. Nausea, vomiting and shortness of breath. EXAM: CHEST - 2 VIEW COMPARISON:  05/30/2021 FINDINGS: Stable cardiac enlargement. Aortic atherosclerosis. Increased lung volumes. Chronic interstitial coarsening. No pleural effusion, interstitial edema or airspace consolidation. Right breast calcifications project over the periphery of the right lower lung. No acute osseous findings. IMPRESSION: 1. No acute cardiopulmonary disease. 2. Cardiomegaly. 3.  Aortic Atherosclerosis (ICD10-I70.0). Electronically Signed   By: Signa Kell M.D.   On: 11/04/2022 16:44       Kathlen Mody  M.D. [facility] 11/06/2022, 1:10 PM  Available via Epic secure chat 7am-7pm After 7 pm, please refer to night coverage provider listed on amion.

## 2022-11-07 ENCOUNTER — Telehealth: Payer: Self-pay

## 2022-11-07 DIAGNOSIS — E782 Mixed hyperlipidemia: Secondary | ICD-10-CM

## 2022-11-07 DIAGNOSIS — I10 Essential (primary) hypertension: Secondary | ICD-10-CM | POA: Diagnosis not present

## 2022-11-07 DIAGNOSIS — E1169 Type 2 diabetes mellitus with other specified complication: Secondary | ICD-10-CM | POA: Diagnosis not present

## 2022-11-07 DIAGNOSIS — L03115 Cellulitis of right lower limb: Secondary | ICD-10-CM | POA: Diagnosis not present

## 2022-11-07 LAB — BASIC METABOLIC PANEL
Anion gap: 9 (ref 5–15)
BUN: 32 mg/dL — ABNORMAL HIGH (ref 8–23)
CO2: 27 mmol/L (ref 22–32)
Calcium: 8.5 mg/dL — ABNORMAL LOW (ref 8.9–10.3)
Chloride: 100 mmol/L (ref 98–111)
Creatinine, Ser: 1.39 mg/dL — ABNORMAL HIGH (ref 0.44–1.00)
GFR, Estimated: 37 mL/min — ABNORMAL LOW (ref >60)
Glucose, Bld: 121 mg/dL — ABNORMAL HIGH (ref 70–99)
Potassium: 3.9 mmol/L (ref 3.5–5.1)
Sodium: 136 mmol/L (ref 135–145)

## 2022-11-07 LAB — CBC WITH DIFFERENTIAL/PLATELET
Abs Immature Granulocytes: 0.04 10*3/uL (ref 0.00–0.07)
Basophils Absolute: 0 10*3/uL (ref 0.0–0.1)
Basophils Relative: 0 %
Eosinophils Absolute: 0.4 10*3/uL (ref 0.0–0.5)
Eosinophils Relative: 5 %
HCT: 32.4 % — ABNORMAL LOW (ref 36.0–46.0)
Hemoglobin: 10.3 g/dL — ABNORMAL LOW (ref 12.0–15.0)
Immature Granulocytes: 1 %
Lymphocytes Relative: 23 %
Lymphs Abs: 1.9 10*3/uL (ref 0.7–4.0)
MCH: 30.7 pg (ref 26.0–34.0)
MCHC: 31.8 g/dL (ref 30.0–36.0)
MCV: 96.7 fL (ref 80.0–100.0)
Monocytes Absolute: 0.6 10*3/uL (ref 0.1–1.0)
Monocytes Relative: 7 %
Neutro Abs: 5.3 10*3/uL (ref 1.7–7.7)
Neutrophils Relative %: 64 %
Platelets: 203 10*3/uL (ref 150–400)
RBC: 3.35 MIL/uL — ABNORMAL LOW (ref 3.87–5.11)
RDW: 12.8 % (ref 11.5–15.5)
WBC: 8.3 10*3/uL (ref 4.0–10.5)
nRBC: 0 % (ref 0.0–0.2)

## 2022-11-07 LAB — GLUCOSE, CAPILLARY: Glucose-Capillary: 118 mg/dL — ABNORMAL HIGH (ref 70–99)

## 2022-11-07 MED ORDER — PANTOPRAZOLE SODIUM 40 MG PO TBEC: 40.0000 mg | DELAYED_RELEASE_TABLET | Freq: Two times a day (BID) | ORAL | 0 refills | Status: DC

## 2022-11-07 MED ORDER — SUCRALFATE 1 GM/10ML PO SUSP: 1.0000 g | Freq: Three times a day (TID) | ORAL | 0 refills | Status: DC

## 2022-11-07 NOTE — Telephone Encounter (Signed)
Copied from CRM [id number]. Topic: General - Other >> Nov 06, 2022  9:01 AM Payton Doughty wrote: Bradly Bienenstock w/ [facility] states they did a hospital follow up over the phone w/ the pt.  Pt was admitted 9/30 - 10/02 for burning in esophagus, unable to keep anything down, feeling like food stuck in her throat.  Bradly Bienenstock wanted to know if pt had made appt for a follow up. Pt was a Engineer, structural pt. When they spoke to her Friday she was still experiencing these symptoms.  Bradly Bienenstock was asking if we could follow up w/ the pt.  However, pt was admitted to [facility] on Sat. 10/05. She said they would most likely follow up w/ her again.

## 2022-11-07 NOTE — Telephone Encounter (Signed)
[name] no longer a patient at [facility]. Patient sees Dr. Sullivan Lone at [facility] in [place].

## 2022-11-07 NOTE — Progress Notes (Signed)
Subjective: Patient states that swallowing has improved. She had chicken and green beans yesterday with minimal discomfort.  Objective: Vital signs in last 24 hours: Temp:  [97.9 F (36.6 C)-99 F (37.2 C)] 97.9 F (36.6 C) (10/08 0601) Pulse Rate:  [60-90] 60 (10/08 0601) Resp:  [16-18] 18 (10/08 0601) BP: (112-134)/(65-90) 118/69 (10/08 0601) SpO2:  [92 %-95 %] 95 % (10/08 0601) Weight change:  Last BM Date : 11/04/22  PE: Elderly, pleasant GENERAL: Not in distress, awake, oriented x 3  ABDOMEN: Nondistended, nontender EXTREMITIES: No deformity  Lab Results: Results for orders placed or performed during the hospital encounter of 11/04/22 (from the past 48 hour(s))  Glucose, capillary     Status: Abnormal   Collection Time: 11/05/22 10:04 AM  Result Value Ref Range   Glucose-Capillary 140 (H) 70 - 99 mg/dL    Comment: Glucose reference range applies only to samples taken after fasting for at least 8 hours.  TSH     Status: None   Collection Time: 11/05/22 11:47 AM  Result Value Ref Range   TSH 1.389 0.350 - 4.500 uIU/mL    Comment: Performed by a 3rd Generation assay with a functional sensitivity of <=0.01 uIU/mL. Performed at [facility], [address], [place], [place] [address]   Glucose, capillary     Status: Abnormal   Collection Time: 11/05/22  1:04 PM  Result Value Ref Range   Glucose-Capillary 156 (H) 70 - 99 mg/dL    Comment: Glucose reference range applies only to samples taken after fasting for at least 8 hours.  Glucose, capillary     Status: Abnormal   Collection Time: 11/05/22  4:55 PM  Result Value Ref Range   Glucose-Capillary 158 (H) 70 - 99 mg/dL    Comment: Glucose reference range applies only to samples taken after fasting for at least 8 hours.  Glucose, capillary     Status: Abnormal   Collection Time: 11/05/22  9:45 PM  Result Value Ref Range   Glucose-Capillary 154 (H) 70 - 99 mg/dL    Comment: Glucose reference range  applies only to samples taken after fasting for at least 8 hours.  CBC     Status: Abnormal   Collection Time: 11/06/22  3:32 AM  Result Value Ref Range   WBC 9.8 4.0 - 10.5 K/uL   RBC 3.11 (L) 3.87 - 5.11 MIL/uL   Hemoglobin 9.7 (L) 12.0 - 15.0 g/dL   HCT 60.4 (L) 54.0 - 98.1 %   MCV 98.1 80.0 - 100.0 fL   MCH 31.2 26.0 - 34.0 pg   MCHC 31.8 30.0 - 36.0 g/dL   RDW 19.1 47.8 - 29.5 %   Platelets 203 150 - 400 K/uL   nRBC 0.0 0.0 - 0.2 %    Comment: Performed at [facility], [address] [place], [place] [address]  Comprehensive metabolic panel     Status: Abnormal   Collection Time: 11/06/22  3:32 AM  Result Value Ref Range   Sodium 137 135 - 145 mmol/L   Potassium 4.0 3.5 - 5.1 mmol/L   Chloride 101 98 - 111 mmol/L   CO2 27 22 - 32 mmol/L   Glucose, Bld 135 (H) 70 - 99 mg/dL    Comment: Glucose reference range applies only to samples taken after fasting for at least 8 hours.   BUN 31 (H) 8 - 23 mg/dL   Creatinine, Ser 8.65 (H) 0.44 - 1.00 mg/dL   Calcium  8.3 (L) 8.9 - 10.3 mg/dL   Total Protein 6.5 6.5 - 8.1 g/dL   Albumin 3.2 (L) 3.5 - 5.0 g/dL   AST 23 15 - 41 U/L   ALT 18 0 - 44 U/L   Alkaline Phosphatase 80 38 - 126 U/L   Total Bilirubin 0.8 0.3 - 1.2 mg/dL   GFR, Estimated 23 (L) >60 mL/min    Comment: (NOTE) Calculated using the CKD-EPI Creatinine Equation (2021)    Anion gap 9 5 - 15    Comment: Performed at [facility], [address] [place], [place] [address]  Glucose, capillary     Status: Abnormal   Collection Time: 11/06/22  7:44 AM  Result Value Ref Range   Glucose-Capillary 135 (H) 70 - 99 mg/dL    Comment: Glucose reference range applies only to samples taken after fasting for at least 8 hours.  Glucose, capillary     Status: Abnormal   Collection Time: 11/06/22 11:44 AM  Result Value Ref Range   Glucose-Capillary 137 (H) 70 - 99 mg/dL    Comment: Glucose reference range applies only to samples taken after  fasting for at least 8 hours.  Vitamin B12     Status: None   Collection Time: 11/06/22 12:37 PM  Result Value Ref Range   Vitamin B-12 231 180 - 914 pg/mL    Comment: (NOTE) This assay is not validated for testing neonatal or myeloproliferative syndrome specimens for Vitamin B12 levels. Performed at [facility], [address], [place], [place] [address]   Folate     Status: None   Collection Time: 11/06/22 12:37 PM  Result Value Ref Range   Folate 13.8 >5.9 ng/mL    Comment: Performed at [facility], [address], [place], [place] [address]  Iron and TIBC     Status: None   Collection Time: 11/06/22 12:37 PM  Result Value Ref Range   Iron 36 28 - 170 ug/dL   TIBC 914 782 - 956 ug/dL   Saturation Ratios 13 10.4 - 31.8 %   UIBC 243 ug/dL    Comment: Performed at [facility], [address] [place], [place] [address]  Ferritin     Status: None   Collection Time: 11/06/22 12:37 PM  Result Value Ref Range   Ferritin 282 11 - 307 ng/mL    Comment: Performed at [facility], [address] [place], [place] [address]  Reticulocytes     Status: Abnormal   Collection Time: 11/06/22 12:37 PM  Result Value Ref Range   Retic Ct Pct 1.9 0.4 - 3.1 %   RBC. 3.20 (L) 3.87 - 5.11 MIL/uL   Retic Count, Absolute 61.8 19.0 - 186.0 K/uL   Immature Retic Fract 7.1 2.3 - 15.9 %    Comment: Performed at [facility], [address], [place], [place] [address]  Urinalysis, Routine w reflex microscopic -Urine, Clean Catch     Status: Abnormal   Collection Time: 11/06/22  1:37 PM  Result Value Ref Range   Color, Urine YELLOW YELLOW   APPearance HAZY (A) CLEAR   Specific Gravity, Urine 1.016 1.005 - 1.030   pH 5.0 5.0 - 8.0   Glucose, UA NEGATIVE NEGATIVE mg/dL   Hgb urine dipstick NEGATIVE NEGATIVE   Bilirubin Urine NEGATIVE NEGATIVE   Ketones, ur NEGATIVE NEGATIVE mg/dL   Protein, ur NEGATIVE  NEGATIVE mg/dL   Nitrite NEGATIVE NEGATIVE  Leukocytes,Ua SMALL (A) NEGATIVE   RBC / HPF 0-5 0 - 5 RBC/hpf   WBC, UA 6-10 0 - 5 WBC/hpf   Bacteria, UA RARE (A) NONE SEEN   Squamous Epithelial / HPF 6-10 0 - 5 /HPF   Mucus PRESENT    Budding Yeast PRESENT    Hyaline Casts, UA PRESENT     Comment: Performed at [facility], [address] [place], [place] [address]  Glucose, capillary     Status: Abnormal   Collection Time: 11/06/22  4:46 PM  Result Value Ref Range   Glucose-Capillary 141 (H) 70 - 99 mg/dL    Comment: Glucose reference range applies only to samples taken after fasting for at least 8 hours.  Glucose, capillary     Status: Abnormal   Collection Time: 11/06/22  9:42 PM  Result Value Ref Range   Glucose-Capillary 169 (H) 70 - 99 mg/dL    Comment: Glucose reference range applies only to samples taken after fasting for at least 8 hours.  Glucose, capillary     Status: Abnormal   Collection Time: 11/07/22  7:42 AM  Result Value Ref Range   Glucose-Capillary 118 (H) 70 - 99 mg/dL    Comment: Glucose reference range applies only to samples taken after fasting for at least 8 hours.  Basic metabolic panel     Status: Abnormal   Collection Time: 11/07/22  8:37 AM  Result Value Ref Range   Sodium 136 135 - 145 mmol/L   Potassium 3.9 3.5 - 5.1 mmol/L   Chloride 100 98 - 111 mmol/L   CO2 27 22 - 32 mmol/L   Glucose, Bld 121 (H) 70 - 99 mg/dL    Comment: Glucose reference range applies only to samples taken after fasting for at least 8 hours.   BUN 32 (H) 8 - 23 mg/dL   Creatinine, Ser 1.91 (H) 0.44 - 1.00 mg/dL   Calcium 8.5 (L) 8.9 - 10.3 mg/dL   GFR, Estimated 37 (L) >60 mL/min    Comment: (NOTE) Calculated using the CKD-EPI Creatinine Equation (2021)    Anion gap 9 5 - 15    Comment: Performed at [facility], [address] [place], [place] [address]  CBC with Differential/Platelet     Status: Abnormal   Collection Time:  11/07/22  8:37 AM  Result Value Ref Range   WBC 8.3 4.0 - 10.5 K/uL   RBC 3.35 (L) 3.87 - 5.11 MIL/uL   Hemoglobin 10.3 (L) 12.0 - 15.0 g/dL   HCT 56.2 (L) 13.0 - 86.5 %   MCV 96.7 80.0 - 100.0 fL   MCH 30.7 26.0 - 34.0 pg   MCHC 31.8 30.0 - 36.0 g/dL   RDW 78.4 69.6 - 29.5 %   Platelets 203 150 - 400 K/uL   nRBC 0.0 0.0 - 0.2 %   Neutrophils Relative % 64 %   Neutro Abs 5.3 1.7 - 7.7 K/uL   Lymphocytes Relative 23 %   Lymphs Abs 1.9 0.7 - 4.0 K/uL   Monocytes Relative 7 %   Monocytes Absolute 0.6 0.1 - 1.0 K/uL   Eosinophils Relative 5 %   Eosinophils Absolute 0.4 0.0 - 0.5 K/uL   Basophils Relative 0 %   Basophils Absolute 0.0 0.0 - 0.1 K/uL   Immature Granulocytes 1 %   Abs Immature Granulocytes 0.04 0.00 - 0.07 K/uL    Comment: Performed at [facility], [address] [place], [place]  [address]    Studies/Results: US RENAL  Result Date: 11/06/2022 CLINICAL DATA:  Acute kidney injury. EXAM: RENAL / URINARY TRACT ULTRASOUND COMPLETE COMPARISON:  05/09/2022, additional prior imaging reviewed including renal mass protocol 05/05/2019 FINDINGS: Right Kidney: Renal measurements: 9.2 x 4 x 4.6 cm = volume: 86.9 mL. Normal parenchymal echogenicity. No hydronephrosis. No visualized stone or focal lesion. Left Kidney: Renal measurements: 10 x 4.6 x 5.6 cm = volume: 134 mL. No hydronephrosis. Solid area in the mid kidney measuring 2.6 x 2.3 x 2.5 cm has been present on prior ultrasounds and likely represents a prominent column of Bertin, no suspicious renal mass seen on intervening CT. The small cysts on prior CT are not seen. Bladder: Decompressed.  Both ureteral jets are seen. Other: None. IMPRESSION: No obstructive uropathy or explanation for acute kidney injury. Electronically Signed   By: Narda Rutherford M.D.   On: 11/06/2022 20:31    Medications: I have reviewed the patient's current medications.  Assessment: Odynophagia, presumed to be related to pill  esophagitis Improving  Plan: Tolerating soft diet. Continue pantoprazole 40 mg twice a day for 1 month. Continue sucralfate 1 g / 10 mL suspension 4 times a day for 2 weeks. Okay to resume Eliquis. Okay to DC home from GI standpoint.  Kerin Salen, MD 11/07/2022, 9:45 AM

## 2022-11-07 NOTE — Discharge Summary (Signed)
Physician Discharge Summary   Patient: Tracy Mann MRN: [id number] DOB: 09-May-1933  Admit date:     11/04/2022  Discharge date: 11/07/2022  Discharge Physician: Kathlen Mody   PCP: Bosie Clos, MD   Recommendations at discharge:  Please follow up with PCP In one week.  Please follow up with ID IN 1 to 2 weeks.  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 [facility] Course:  Tracy Mann is a 89 y.o. 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  The patient stated in triage "it feels like I have a rock in my esophagus."   Assessment and Plan:     Cellulitis of right lower extremity Discussed with Dr. Renold Don who is on-call for ID. -Patient received a dose of dalbavancin in ED. (Salvage dose with long half-life with 2-week oxygen) -No further antibiotics recommended at the  moment. -Patient to see see Dr Rivka Safer in 7-10 days. - discussed the plan with the patient.     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  GI consulted and recommendations given.  Currently on soft diet.  GI recommends to continue with sucralfate, protonix, for 1 month an sucralfate for 2 weeks. If she worsens will need a EGD in the future.      Chronic atrial fibrillation (HCC) CHA?DS?-VASc score of 7. Rate better controlled.  Resume eliquis.      CAD, NATIVE VESSEL On apixaban and antihyperlipidemic. Unable to tolerate low-dose beta-blocker.     Essential hypertension Well controlled.       Aki  on CKD stage 3b, GFR 30-44 ml/min (HCC) Creatinine is slightly increased from yesterday. GFR down from 46 to 39 mm/min. Creatinine improved to baseline. Resume home meds on discharge.         Chronic pain syndrome Resume home meds.      Hyperlipidemia   Aortic atherosclerosis (HCC)  Continue ezetimibe 10 mg p.o. daily.     Hypothyroidism Continue levothyroxine 25 mcg p.o. daily.     Restless legs syndrome Off ropinirole at the moment.     Diabetes mellitus (HCC) type 2 inusiln dependent. Well controlled.  Carbohydrate modified diet. Resume home med.s  Recent hemoglobin A1c 6.7%.  Estimated body mass index is 36.31 kg/m as calculated from the following:   Height as of this encounter: 5\' 3"  (1.6 m).   Weight as of this encounter: 93 kg.     Consultants: gastroenterology Procedures performed: none.   Disposition: Home  Diet recommendation:  Carb modified diet DISCHARGE MEDICATION: Allergies as of 11/07/2022       Reactions   Clindamycin/lincomycin Shortness Of Breath, Swelling   Duloxetine Other (See Comments)   Same as Paroxetine   Paroxetine Other (See Comments)   "Horrible headache," Stomach pain, Diarrhea   Sulfa Antibiotics Anaphylaxis   Cephalexin Other (See Comments)   Blisters   Clarithromycin Other (See  Comments)   Hives, headaches, hard time swelling, felt like throat was closing up Hives, headaches, hard time swelling, felt like throat was closing up    Hives, headaches, hard time swelling, felt like throat was closing up Hives, headaches, hard time swelling, felt like throat was closing up   Diphenhydramine Other (See Comments)   Estrogens Conjugated    Estrogens Conjugated    Nitrofurantoin Nausea Only        Medication List     STOP taking these medications    doxycycline 100 MG tablet Commonly known as: VIBRA-TABS       TAKE these medications    albuterol 108 (90 Base) MCG/ACT inhaler Commonly known as: VENTOLIN HFA TAKE 2 PUFFS BY MOUTH EVERY 6 HOURS AS NEEDED FOR WHEEZE OR SHORTNESS OF BREATH What changed: See the new instructions.   apixaban 5 MG Tabs tablet Commonly known as: ELIQUIS Take 1 tablet (5 mg total) by mouth 2 (two) times daily.   ezetimibe 10 MG tablet Commonly known as: ZETIA Take 1 tablet (10 mg total) by mouth daily.   flavoxATE 100 MG tablet Commonly known as: URISPAS Take 1 tablet (100 mg total) by mouth 2 (two) times daily as needed for bladder spasms.   fluticasone 50 MCG/ACT nasal spray Commonly known as: FLONASE Place 2 sprays into both nostrils daily.   HYDROcodone-acetaminophen 10-325 MG tablet Commonly known as: Norco Take 1 tablet by mouth every 8 (eight) hours as needed for severe pain. Must last 30 days. Start taking on: December 22, 2022   insulin lispro 100 UNIT/ML KwikPen Commonly known as: HUMALOG Inject 10-25 Units into the skin in the morning, at noon, and at bedtime.   Lantus SoloStar 100 UNIT/ML Solostar Pen Generic drug: insulin glargine Inject 18-24 Units into the skin at bedtime.   levothyroxine 25 MCG tablet Commonly known as: SYNTHROID Take 25 mcg by mouth daily before breakfast.   lisinopril 10 MG tablet Commonly known as: ZESTRIL Take 1 tablet (10 mg total) by mouth daily.   LORazepam 0.5 MG  tablet Commonly known as: ATIVAN Take 0.5 mg by mouth 2 (two) times daily.   Magnesium 500 MG Caps Take 1,000 mg by mouth daily.   mupirocin cream 2 % Commonly known as: BACTROBAN Apply topically 3 (three) times daily.   naloxone 4 MG/0.1ML Liqd nasal spray kit Commonly known as: NARCAN Place 1 spray into the nose as needed for up to 365 doses (for opioid-induced respiratory depresssion). In case of emergency (overdose), spray once into each nostril. If no response within 3 minutes, repeat application and call 911.   nitrofurantoin 50 MG capsule Commonly known as: MACRODANTIN Take 50 mg by mouth at bedtime.   nitroGLYCERIN 0.4 MG SL tablet Commonly known as: NITROSTAT Place 1 tablet (0.4 mg total) under the tongue every 5 (five) minutes as needed for chest pain.   ondansetron 4 MG tablet Commonly known as: Zofran Take 1 tablet (4 mg total) by mouth every 12 (twelve) hours as needed for nausea or vomiting.   pantoprazole 40 MG tablet Commonly known as: PROTONIX  Take 1 tablet (40 mg total) by mouth 2 (two) times daily before a meal.   RELION PEN NEEDLE 31G/8MM 31G X 8 MM Misc Generic drug: Insulin Pen Needle Use with insulin pen three times daily   sucralfate 1 GM/10ML suspension Commonly known as: CARAFATE Take 10 mLs (1 g total) by mouth 4 (four) times daily -  with meals and at bedtime for 15 days.   torsemide 20 MG tablet Commonly known as: DEMADEX Take 2 tablets (40 mg total) by mouth daily.        Follow-up Information     Bosie Clos, MD. Schedule an appointment as soon as possible for a visit in 1 week(s).   Specialty: Family Medicine Contact information: [address] [place] [address] [phone]         Lynn Ito, MD. Schedule an appointment as soon as possible for a visit in 1 week(s).   Specialty: Infectious Diseases Contact information: [address] [place] [address] [phone]                 Discharge Exam: Ceasar Mons Weights   11/04/22 1445  Weight: 93 kg   General exam: Appears calm and comfortable  Respiratory system: Clear to auscultation. Respiratory effort normal. Cardiovascular system: S1 & S2 heard, RRR. No JVD, Gastrointestinal system: Abdomen is nondistended, soft and nontender.  Central nervous system: Alert and oriented. No focal neurological deficits. Extremities: RLE cellulitis improving.  Skin: No rashes,  Psychiatry: Mood & affect appropriate.    Condition at discharge: fair  The results of significant diagnostics from this hospitalization (including imaging, microbiology, ancillary and laboratory) are listed below for reference.   Imaging Studies: US RENAL  Result Date: 11/06/2022 CLINICAL DATA:  Acute kidney injury. EXAM: RENAL / URINARY TRACT ULTRASOUND COMPLETE COMPARISON:  05/09/2022, additional prior imaging reviewed including renal mass protocol 05/05/2019 FINDINGS: Right Kidney: Renal measurements: 9.2 x 4 x 4.6 cm = volume: 86.9 mL. Normal parenchymal echogenicity. No hydronephrosis. No visualized stone or focal lesion. Left Kidney: Renal measurements: 10 x 4.6 x 5.6 cm = volume: 134 mL. No hydronephrosis. Solid area in the mid kidney measuring 2.6 x 2.3 x 2.5 cm has been present on prior ultrasounds and likely represents a prominent column of Bertin, no suspicious renal mass seen on intervening CT. The small cysts on prior CT are not seen. Bladder: Decompressed.  Both ureteral jets are seen. Other: None. IMPRESSION: No obstructive uropathy or explanation for acute kidney injury. Electronically Signed   By: Narda Rutherford M.D.   On: 11/06/2022 20:31   DG Chest 2 View  Result Date: 11/04/2022 CLINICAL DATA:  Chest pain for 3 days. Pain began after starting antibiotics. Frequent burping. Nausea, vomiting and shortness of breath. EXAM: CHEST - 2 VIEW COMPARISON:  05/30/2021 FINDINGS: Stable cardiac enlargement. Aortic atherosclerosis. Increased lung  volumes. Chronic interstitial coarsening. No pleural effusion, interstitial edema or airspace consolidation. Right breast calcifications project over the periphery of the right lower lung. No acute osseous findings. IMPRESSION: 1. No acute cardiopulmonary disease. 2. Cardiomegaly. 3.  Aortic Atherosclerosis (ICD10-I70.0). Electronically Signed   By: Signa Kell M.D.   On: 11/04/2022 16:44    Microbiology: Results for orders placed or performed during the hospital encounter of 10/30/22  Blood culture (routine x 2)     Status: None   Collection Time: 10/30/22  6:48 PM   Specimen: BLOOD  Result Value Ref Range Status   Specimen Description BLOOD LEFT ANTECUBITAL  Final  Special Requests   Final    BOTTLES DRAWN AEROBIC AND ANAEROBIC Blood Culture adequate volume   Culture   Final    NO GROWTH 5 DAYS Performed at [facility], [address] [place], [place] [address]    Report Status 11/04/2022 FINAL  Final  Blood culture (routine x 2)     Status: None   Collection Time: 10/30/22 10:32 PM   Specimen: BLOOD  Result Value Ref Range Status   Specimen Description BLOOD BLOOD LEFT HAND  Final   Special Requests   Final    BOTTLES DRAWN AEROBIC AND ANAEROBIC Blood Culture adequate volume   Culture   Final    NO GROWTH 5 DAYS Performed at [facility], [address], [place], [place] [address]    Report Status 11/04/2022 FINAL  Final    Labs: CBC: Recent Labs  Lab 11/01/22 0519 11/04/22 1515 11/05/22 0929 11/06/22 0332 11/07/22 0837  WBC 12.5* 10.2 9.0 9.8 8.3  NEUTROABS  --  7.1 6.8  --  5.3  HGB 10.8* 11.4* 11.5* 9.7* 10.3*  HCT 33.4* 34.2* 35.6* 30.5* 32.4*  MCV 95.4 93.2 96.2 98.1 96.7  PLT 193 215 227 203 203   Basic Metabolic Panel: Recent Labs  Lab 11/01/22 0519 11/04/22 1515 11/05/22 0929 11/06/22 0332 11/07/22 0837  NA 136 140 137 137 136  K 4.1 3.8 3.8 4.0 3.9  CL 98 98 96* 101 100  CO2 27 31 30 27 27   GLUCOSE 134* 151* 167* 135*  121*  BUN 32* 27* 24* 31* 32*  CREATININE 1.10* 1.16* 1.31* 2.03* 1.39*  CALCIUM 8.7* 10.3 9.1 8.3* 8.5*  MG  --   --  1.7  --   --   PHOS  --   --  3.7  --   --    Liver Function Tests: Recent Labs  Lab 11/05/22 0929 11/06/22 0332  AST 22 23  ALT 17 18  ALKPHOS 91 80  BILITOT 1.1 0.8  PROT 7.4 6.5  ALBUMIN 3.5 3.2*   CBG: Recent Labs  Lab 11/06/22 0744 11/06/22 1144 11/06/22 1646 11/06/22 2142 11/07/22 0742  GLUCAP 135* 137* 141* 169* 118*    Discharge time spent: 38 minutes.   Signed: Kathlen Mody, MD [facility] 11/07/2022

## 2022-11-07 NOTE — Plan of Care (Signed)
Discharge instructions given to the patient including medications and follow up.  

## 2022-11-16 ENCOUNTER — Ambulatory Visit: Payer: Medicare Other | Admitting: Physician Assistant

## 2022-11-20 ENCOUNTER — Ambulatory Visit: Payer: Medicare Other | Admitting: Urology

## 2022-11-21 ENCOUNTER — Ambulatory Visit: Payer: Medicare Other | Admitting: Internal Medicine

## 2022-11-24 ENCOUNTER — Ambulatory Visit: Payer: Medicare Other | Admitting: Physician Assistant

## 2022-12-14 ENCOUNTER — Other Ambulatory Visit: Payer: Self-pay

## 2022-12-14 MED ORDER — LISINOPRIL 10 MG PO TABS: 10.0000 mg | ORAL_TABLET | Freq: Every day | ORAL | 1 refills | Status: DC

## 2022-12-21 ENCOUNTER — Other Ambulatory Visit: Payer: Self-pay | Admitting: Internal Medicine

## 2022-12-21 DIAGNOSIS — N3090 Cystitis, unspecified without hematuria: Secondary | ICD-10-CM

## 2022-12-24 NOTE — Progress Notes (Unsigned)
Cardiology Office Note  Date:  12/25/2022   ID:  Nicolly, Hamid July 18, 1933, MRN [id number]  PCP:  Bosie Clos, MD   Chief Complaint  Patient presents with   6 month follow up     Patient c/o tired more than usual. Medications reviewed by the patient verbally.     HPI:  Ms. Dephillips is a 89 year old woman with a PMH significant for  chronic atrial fibrillation on eliquis Cardiomyopathy Medication intolerance diastolic and systolic dysfunction, EF 40 to 45% on echo 01/2018  hypertension,  hyperlipidemia,  morbid obesity,   moderate pulmonary hypertension inferior wall myocardial infarction felt to be secondary to a thrombus from atrial fib in October 2007 at [facility],  episodes of chest pain  Ejection fraction 30 to 35%, moderately elevated right heart pressures, July 16, 2020 following COVID EF 30 to 35% on echo 05/18/21 who presents for followup Of her coronary artery disease and leg edema., persistent atrial fibrillation, cardiomyopathy  Last seen by myself May 2024  In hospital 10/24 Cellulitis of right lower extremity  In the hospital 10/30/2022 until 11/01/2022 treated with vancomycin and prescribed oral doxycycline   Representing to emergency department due to progressively worse chest pain with odynophagia for the past 3 days  Pill esophagitis due to tetracycline   In follow-up she reports that her throat is better, now eating normally Weight down 201, from not eating compliant with torsemide 40 daily,  Takes metolazone very sparingly  No chest pain Reports leg swelling stable Has been sedentary recently  Lab work reviewed A1c 6.8 Creatinine 1.4 BUN 45 TSH 2.6 Hemoglobin 11.9 Followed by nephrology  EKG personally reviewed by myself on todays visit EKG Interpretation Date/Time:  Monday December 25 2022 14:07:11 EST Ventricular Rate:  81 PR Interval:    QRS Duration:  128 QT Interval:  396 QTC Calculation: 460 R  Axis:   -68  Text Interpretation: Atrial fibrillation Left axis deviation Non-specific intra-ventricular conduction block Minimal voltage criteria for LVH, may be normal variant ( Cornell product ) Possible Lateral infarct , age undetermined When compared with ECG of 04-Nov-2022 17:22, No significant change was found Confirmed by Julien Nordmann [id number]) on 12/25/2022 2:12:48 PM    Prior medication intolerances Stopped metoprolol, had dizzy spell/near syncope intolerances to [place], carvedilol unable to tolerate [place], makes her sneeze, cough 30 minutes after taking it with a runny nose and eye watering  Prior imaging reviewed Echocardiogram April 19 with ejection fraction 30 to 35% Moderate to severely elevated right heart pressures Severe biatrial enlargement moderate to severe MR No dramatic changes compared to study dated June 22  Previous attempt to use Carvedilol 3.25 twice daily started, Entresto 24/26 unsuccessful secondary to intolerance Went back on lisinopril, stopped the coreg  COVID infection early 2022  Does not use her lymphedema compression pumps  HBA1C 9.1 on labs 12/2018 HBA1C 8.3 on 05/20/2019  PMH:   has a past medical history of Acute myocardial infarction of inferior wall (HCC), Arthritis, Chronic anticoagulation, Chronic atrial fibrillation (HCC), Chronic combined systolic and diastolic CHF (congestive heart failure) (HCC), Essential hypertension, Hyperlipidemia, mixed, Mixed Ischemic/Nonischemic Cardiomyopathy, Nonobstructive coronary artery disease, Obesity, Thyroid disease, Type II diabetes mellitus (HCC), Venous insufficiency, and Vitamin D deficiency.  PSH:    Past Surgical History:  Procedure Laterality Date   CARDIAC CATHETERIZATION  11/2012   [facility];EF 45-50%   CHOLECYSTECTOMY  1999   TUBAL LIGATION  1968   VESICOVAGINAL FISTULA CLOSURE W/ TAH  1996  Current Outpatient Medications  Medication Sig Dispense Refill   albuterol (VENTOLIN HFA) 108  (90 Base) MCG/ACT inhaler TAKE 2 PUFFS BY MOUTH EVERY 6 HOURS AS NEEDED FOR WHEEZE OR SHORTNESS OF BREATH (Patient taking differently: Inhale 2 puffs into the lungs every 6 (six) hours as needed for shortness of breath or wheezing.) 8.5 each 2   apixaban (ELIQUIS) 5 MG TABS tablet Take 1 tablet (5 mg total) by mouth 2 (two) times daily. 180 tablet 3   estradiol (ESTRACE) 0.1 MG/GM vaginal cream Place 1 Applicatorful vaginally daily.     ezetimibe (ZETIA) 10 MG tablet Take 1 tablet (10 mg total) by mouth daily. 90 tablet 2   flavoxATE (URISPAS) 100 MG tablet Take 1 tablet (100 mg total) by mouth 2 (two) times daily as needed for bladder spasms. 60 tablet 11   fluticasone (FLONASE) 50 MCG/ACT nasal spray Place 2 sprays into both nostrils daily. 48 mL 3   HYDROcodone-acetaminophen (NORCO) 10-325 MG tablet Take 1 tablet by mouth every 8 (eight) hours as needed for severe pain. Must last 30 days. 60 tablet 0   insulin glargine (LANTUS SOLOSTAR) 100 UNIT/ML Solostar Pen Inject 18-24 Units into the skin at bedtime.     insulin lispro (HUMALOG) 100 UNIT/ML KwikPen Inject 10-25 Units into the skin in the morning, at noon, and at bedtime.     Insulin Pen Needle (RELION PEN NEEDLE 31G/8MM) 31G X 8 MM MISC Use with insulin pen three times daily 100 each 12   levothyroxine (SYNTHROID) 25 MCG tablet Take 25 mcg by mouth daily before breakfast.     lisinopril (ZESTRIL) 10 MG tablet Take 1 tablet (10 mg total) by mouth daily. 90 tablet 1   LORazepam (ATIVAN) 0.5 MG tablet Take 0.5 mg by mouth 2 (two) times daily.     Magnesium 500 MG CAPS Take 1,000 mg by mouth daily.      mupirocin cream (BACTROBAN) 2 % Apply topically 3 (three) times daily. 15 g 0   naloxone (NARCAN) nasal spray 4 mg/0.1 mL Place 1 spray into the nose as needed for up to 365 doses (for opioid-induced respiratory depresssion). In case of emergency (overdose), spray once into each nostril. If no response within 3 minutes, repeat application and call  911. 1 each 0   nitrofurantoin (MACRODANTIN) 50 MG capsule Take 50 mg by mouth at bedtime.     nitroGLYCERIN (NITROSTAT) 0.4 MG SL tablet Place 1 tablet (0.4 mg total) under the tongue every 5 (five) minutes as needed for chest pain. 25 tablet 1   ondansetron (ZOFRAN) 4 MG tablet Take 1 tablet (4 mg total) by mouth every 12 (twelve) hours as needed for nausea or vomiting. 60 tablet 2   pantoprazole (PROTONIX) 40 MG tablet Take 1 tablet (40 mg total) by mouth 2 (two) times daily before a meal. 60 tablet 0   sucralfate (CARAFATE) 1 GM/10ML suspension Take 10 mLs (1 g total) by mouth 4 (four) times daily -  with meals and at bedtime for 15 days. 600 mL 0   torsemide (DEMADEX) 20 MG tablet Take 2 tablets (40 mg total) by mouth daily. 180 tablet 3   No current facility-administered medications for this visit.    Allergies:   Clindamycin/lincomycin, Duloxetine, Paroxetine, Sulfa antibiotics, Cephalexin, Clarithromycin, Diphenhydramine, Estrogens conjugated, Estrogens conjugated, and Nitrofurantoin   Social History:  The patient  reports that she has never smoked. She has never used smokeless tobacco. She reports that she does not drink alcohol and  does not use drugs.   Family History:   family history includes Heart attack in her mother; Heart disease in her brother, brother, and mother; Thyroid disease in her father.    Review of Systems: Review of Systems  Constitutional:  Positive for malaise/fatigue.  HENT: Negative.    Respiratory: Negative.    Cardiovascular: Negative.   Gastrointestinal: Negative.   Musculoskeletal: Negative.        Leg weakness  Neurological: Negative.   Psychiatric/Behavioral: Negative.    All other systems reviewed and are negative.   PHYSICAL EXAM: VS:  BP (!) 110/58 (BP Location: Left Arm, Patient Position: Sitting, Cuff Size: Normal)   Pulse 81   Ht 5\' 3"  (1.6 m)   Wt 201 lb (91.2 kg)   SpO2 92%   BMI 35.61 kg/m  , BMI Body mass index is 35.61  kg/m. Constitutional:  oriented to person, place, and time. No distress.  HENT:  Head: Grossly normal Eyes:  no discharge. No scleral icterus.  Neck: No JVD, no carotid bruits  Cardiovascular: Regular rate and rhythm, no murmurs appreciated Pulmonary/Chest: Clear to auscultation bilaterally, no wheezes or rails Abdominal: Soft.  no distension.  no tenderness.  Musculoskeletal: Normal range of motion Neurological:  normal muscle tone. Coordination normal. No atrophy Skin: Skin warm and dry Psychiatric: normal affect, pleasant  Recent Labs: 11/05/2022: Magnesium 1.7; TSH 1.389 11/06/2022: ALT 18 11/07/2022: BUN 32; Creatinine, Ser 1.39; Hemoglobin 10.3; Platelets 203; Potassium 3.9; Sodium 136    Lipid Panel Wt Readings from Last 3 Encounters:  12/25/22 201 lb (91.2 kg)  11/04/22 205 lb (93 kg)  10/30/22 202 lb (91.6 kg)     ASSESSMENT AND PLAN:  Acute on chronic diastolic and systolic CHF Weight is down to 201 pounds from 209 after recent dysphagia in the setting of pill esophagitis Takes torsemide 40 every day, rarely skips a day Not taking metolazone, moderated her fluid intake Trace woody leg edema Compression hose if tolerated  Cardiomyopathy Last evaluation April 2023 ejection fraction 30 to 35% Previous attempts at goal-directed medical therapy unsuccessful secondary to medication intolerances did not tolerate Farxiga, has not tolerated Entresto, spironolactone, Coreg Weight stable on current regiment, continue lisinopril She feels she is euvolemic  Mixed  hyperlipidemia Continue Zetia Statin intolerance, no changes made  Essential hypertension  Lisinopril  10 daily, diuretic as above to my 40 daily She previously stopped metoprolol for near-syncope Medication intolerances as detailed above  Chronic atrial fibrillation (HCC)  A. fib  contributing to CHF Tolerating eliquis   Obstructive sleep apnea Previously reported this was "not a major issue" Currently  not on CPAP  Type 2 diabetes mellitus without complication, with long-term current use of insulin (HCC) Recommend low carbohydrate diet, walking program Weight down 209 down to 201 Previously ried Ozempic/Mounjaro, had stomach cramps    Orders Placed This Encounter  Procedures   EKG 12-Lead     Signed, Dossie Arbour, M.D., Ph.D. 12/25/2022  [facility] [place], [place] [phone]

## 2022-12-25 ENCOUNTER — Ambulatory Visit: Payer: Medicare Other | Attending: Cardiovascular Disease | Admitting: Cardiovascular Disease

## 2022-12-25 ENCOUNTER — Encounter: Payer: Self-pay | Admitting: Cardiovascular Disease

## 2022-12-25 VITALS — BP 110/58 | HR 81 | Ht 63.0 in | Wt 201.0 lb

## 2022-12-25 DIAGNOSIS — I25118 Atherosclerotic heart disease of native coronary artery with other forms of angina pectoris: Secondary | ICD-10-CM | POA: Diagnosis not present

## 2022-12-25 DIAGNOSIS — E782 Mixed hyperlipidemia: Secondary | ICD-10-CM

## 2022-12-25 DIAGNOSIS — I482 Chronic atrial fibrillation, unspecified: Secondary | ICD-10-CM | POA: Diagnosis not present

## 2022-12-25 DIAGNOSIS — I340 Nonrheumatic mitral (valve) insufficiency: Secondary | ICD-10-CM

## 2022-12-25 DIAGNOSIS — R55 Syncope and collapse: Secondary | ICD-10-CM | POA: Diagnosis not present

## 2022-12-25 DIAGNOSIS — R0609 Other forms of dyspnea: Secondary | ICD-10-CM

## 2022-12-25 DIAGNOSIS — I700 Atherosclerosis of aorta: Secondary | ICD-10-CM

## 2022-12-25 DIAGNOSIS — I739 Peripheral vascular disease, unspecified: Secondary | ICD-10-CM

## 2022-12-25 DIAGNOSIS — I5042 Chronic combined systolic (congestive) and diastolic (congestive) heart failure: Secondary | ICD-10-CM

## 2022-12-25 DIAGNOSIS — R0789 Other chest pain: Secondary | ICD-10-CM

## 2022-12-25 DIAGNOSIS — Z789 Other specified health status: Secondary | ICD-10-CM

## 2022-12-25 DIAGNOSIS — G4733 Obstructive sleep apnea (adult) (pediatric): Secondary | ICD-10-CM

## 2022-12-25 NOTE — Patient Instructions (Signed)
Medication Instructions:  ?No changes ? ?If you need a refill on your cardiac medications before your next appointment, please call your pharmacy.  ? ?Lab work: ?No new labs needed ? ?Testing/Procedures: ?No new testing needed ? ?Follow-Up: ?At [facility], you and your health needs are our priority.  As part of our continuing mission to provide you with exceptional heart care, we have created designated Provider Care Teams.  These Care Teams include your primary Cardiologist (physician) and Advanced Practice Providers (APPs -  Physician Assistants and Nurse Practitioners) who all work together to provide you with the care you need, when you need it. ? ?You will need a follow up appointment in 6 months, APP ok ? ?Providers on your designated Care Team:   ?Nicolasa Ducking, NP ?Eula Listen, PA-C ?Cadence Fransico Michael, PA-C ? ?COVID-19 Vaccine Information can be found at: [email or web address] For questions related to vaccine distribution or appointments, please email [email or web address] or call [phone].  ? ?

## 2022-12-26 ENCOUNTER — Ambulatory Visit
Admission: RE | Admit: 2022-12-26 | Discharge: 2022-12-26 | Disposition: A | Discharge status: Home / Self Care | Payer: Medicare Other | Source: Ambulatory Visit | Attending: Internal Medicine | Admitting: Internal Medicine

## 2022-12-26 DIAGNOSIS — N2889 Other specified disorders of kidney and ureter: Secondary | ICD-10-CM | POA: Insufficient documentation

## 2022-12-26 DIAGNOSIS — N3090 Cystitis, unspecified without hematuria: Secondary | ICD-10-CM | POA: Insufficient documentation

## 2023-01-16 NOTE — Progress Notes (Unsigned)
PROVIDER NOTE: Information contained herein reflects review and annotations entered in association with encounter. Interpretation of such information and data should be left to medically-trained personnel. Information provided to patient can be located elsewhere in the medical record under "Patient Instructions". Document created using STT-dictation technology, any transcriptional errors that may result from process are unintentional.    Patient: Tracy Mann  Service Category: E/M  Provider: Oswaldo Done, MD  DOB: [birth year]  DOS: 01/17/2023  Referring Provider: Bosie Clos, MD  MRN: [id number]  Specialty: Interventional Pain Management  PCP: Bosie Clos, MD  Type: Established Patient  Setting: Ambulatory outpatient    Location: Office  Delivery: Face-to-face     HPI  Ms. IO DONNEL, a 89 y.o. year old female, is here today because of her No primary diagnosis found.. Ms. Bais's primary complain today is No chief complaint on file.  Pertinent problems: Ms. Wilkens has Cervical muscle strain; DDD (degenerative disc disease), lumbosacral; Chronic venous insufficiency; Chronic pain syndrome; Abnormal MRI, lumbar spine (08/22/2019); Lumbar facet hypertrophy (Multilevel) (Bilateral); Lumbar central spinal stenosis (SEVERE) (L3-4) with neurogenic claudication; Grade 1 Anterolisthesis of lumbar spine (L4/L5); Lumbar facet arthropathy (L3-4 right-sided facet edema/joint effusion); Lumbar lateral recess stenosis (L4-5) (Right); Chronic low back pain (3ry area of Pain) (Bilateral) w/ sciatica (Bilateral); Lumbosacral radiculopathy at L5 (Bilateral); Chronic lower extremity pain (1ry area of Pain) (Bilateral); Lumbosacral facet syndrome; Chronic hip pain (2ry area of Pain) (Bilateral) (R>L); Osteoarthritis involving multiple joints; Osteoarthritis of knee (Right); Osteoarthritis of facet joint of lumbar spine; Osteoarthritis of lumbar spine; Bilateral lower extremity edema; Cellulitis of right  lower leg; Chronic knee pain (Bilateral); Peripheral vascular disease (HCC) (lower extremity) (Bilateral); Cervicalgia; Chronic upper back pain; and Lumbosacral spondylosis with radiculopathy on their pertinent problem list. Pain Assessment: Severity of   is reported as a  /10. Location:    / . Onset:  . Quality:  . Timing:  . Modifying factor(s):  Marland Kitchen Vitals:  vitals were not taken for this visit.  BMI: Estimated body mass index is 35.61 kg/m as calculated from the following:   Height as of 12/25/22: 5\' 3"  (1.6 m).   Weight as of 12/25/22: 201 lb (91.2 kg). Last encounter: 10/18/2022. Last procedure: Visit date not found.  Reason for encounter: medication management. ***  Discussed the use of AI scribe software for clinical note transcription with the patient, who gave verbal consent to proceed.  History of Present Illness         RTCB: 04/21/2023   Pharmacotherapy Assessment  Analgesic:  Hydrocodone/APAP 10/325, (see the 05/09/2021 note) using 1.675 pills/day. MME/day: 10-20 mg/day   Monitoring: [place] PMP: PDMP reviewed during this encounter.       Pharmacotherapy: No side-effects or adverse reactions reported. Compliance: No problems identified. Effectiveness: Clinically acceptable.  No notes on file  No results found for: "CBDTHCR" No results found for: "D8THCCBX" No results found for: "D9THCCBX"  UDS:  Summary  Date Value Ref Range Status  07/19/2022 Note  Final    Comment:    ==================================================================== [employer] (MW) ==================================================================== Test                             Result       Flag       Units  Drug Present and Declared for Prescription Verification   Lorazepam  713          EXPECTED   ng/mg creat    Source of lorazepam is a scheduled prescription medication.    Hydrocodone                    1207         EXPECTED   ng/mg creat   Hydromorphone                   83           EXPECTED   ng/mg creat   Dihydrocodeine                 293          EXPECTED   ng/mg creat   Norhydrocodone                 902          EXPECTED   ng/mg creat    Sources of hydrocodone include scheduled prescription medications.    Hydromorphone, dihydrocodeine and norhydrocodone are expected    metabolites of hydrocodone. Hydromorphone and dihydrocodeine are    also available as scheduled prescription medications.  ==================================================================== Test                      Result    Flag   Units      Ref Range   Creatinine              89               mg/dL      >=75 ==================================================================== Declared Medications:  The flagging and interpretation on this report are based on the  following declared medications.  Unexpected results may arise from  inaccuracies in the declared medications.   **Note: The testing scope of this panel includes these medications:   Hydrocodone (Norco)  Lorazepam (Ativan)   **Note: The testing scope of this panel does not include the  following reported medications:   Acetaminophen (Norco)  Albuterol (Ventolin HFA)  Apixaban (Eliquis)  Doxycycline  Ezetimibe (Zetia)  Flavoxate (Urispas)  Fluticasone (Flonase)  Insulin (Lantus)  Levothyroxine (Synthroid)  Lisinopril (Zestril)  Magnesium  Metolazone (Zaroxolyn)  Naloxone (Narcan)  Nitrofurantoin (Macrodantin)  Nitroglycerin (Nitrostat)  Ondansetron (Zofran)  Pantoprazole (Protonix)  Phenazopyridine (Pyridium)  Polyethylene Glycol (GlycoLax)  Ropinirole (Requip)  Torsemide (Demadex) ==================================================================== For clinical consultation, please call [phone]. ====================================================================       ROS  Constitutional: Denies any fever or chills Gastrointestinal: No reported hemesis, hematochezia,  vomiting, or acute GI distress Musculoskeletal: Denies any acute onset joint swelling, redness, loss of ROM, or weakness Neurological: No reported episodes of acute onset apraxia, aphasia, dysarthria, agnosia, amnesia, paralysis, loss of coordination, or loss of consciousness  Medication Review  HYDROcodone-acetaminophen, Insulin Pen Needle, LORazepam, Magnesium, albuterol, apixaban, estradiol, ezetimibe, flavoxATE, fluticasone, insulin glargine, insulin lispro, levothyroxine, lisinopril, mupirocin cream, naloxone, nitroGLYCERIN, nitrofurantoin, ondansetron, pantoprazole, sucralfate, and torsemide  History Review  Allergy: Ms. Mathison is allergic to clindamycin/lincomycin, duloxetine, paroxetine, sulfa antibiotics, cephalexin, clarithromycin, diphenhydramine, estrogens conjugated, estrogens conjugated, and nitrofurantoin. Drug: Ms. Mcgrue  reports no history of drug use. Alcohol:  reports no history of alcohol use. Tobacco:  reports that she has never smoked. She has never used smokeless tobacco. Social: Ms. Ash  reports that she has never smoked. She has never used smokeless tobacco. She reports that she does not drink alcohol and does not use drugs. Medical:  has a past medical history of Acute myocardial infarction of inferior wall (HCC), Arthritis, Chronic anticoagulation, Chronic atrial fibrillation (HCC), Chronic combined systolic and diastolic CHF (congestive heart failure) (HCC), Essential hypertension, Hyperlipidemia, mixed, Mixed Ischemic/Nonischemic Cardiomyopathy, Nonobstructive coronary artery disease, Obesity, Thyroid disease, Type II diabetes mellitus (HCC), Venous insufficiency, and Vitamin D deficiency. Surgical: Ms. Clyburn  has a past surgical history that includes Vesicovaginal fistula closure w/ TAH (1996); Tubal ligation (1968); Cholecystectomy (1999); and Cardiac catheterization (11/2012). Family: family history includes Heart attack in her mother; Heart disease in her brother,  brother, and mother; Thyroid disease in her father.  Laboratory Chemistry Profile   Renal Lab Results  Component Value Date   BUN 32 (H) 11/07/2022   CREATININE 1.39 (H) 11/07/2022   BCR 23 03/15/2022   GFRAA 73 12/31/2019   GFRNONAA 37 (L) 11/07/2022    Hepatic Lab Results  Component Value Date   AST 23 11/06/2022   ALT 18 11/06/2022   ALBUMIN 3.2 (L) 11/06/2022   ALKPHOS 80 11/06/2022    Electrolytes Lab Results  Component Value Date   NA 136 11/07/2022   K 3.9 11/07/2022   CL 100 11/07/2022   CALCIUM 8.5 (L) 11/07/2022   MG 1.7 11/05/2022   PHOS 3.7 11/05/2022    Bone Lab Results  Component Value Date   25OHVITD1 43 11/26/2019   25OHVITD2 9.2 11/26/2019   25OHVITD3 34 11/26/2019    Inflammation (CRP: Acute Phase) (ESR: Chronic Phase) Lab Results  Component Value Date   CRP 4 11/26/2019   ESRSEDRATE 45 (H) 11/26/2019   LATICACIDVEN 1.4 10/31/2022         Note: Above Lab results reviewed.  Recent Imaging Review  US RENAL CLINICAL DATA:  Cystitis  EXAM: RENAL / URINARY TRACT ULTRASOUND COMPLETE  COMPARISON:  Renal ultrasound 11/06/2022  FINDINGS: Right Kidney:  Renal measurements: 9.5 x 3.2 x 4.7 cm = volume: 74.6 mL. Echogenicity within normal limits. No mass or hydronephrosis visualized.  Left Kidney:  Renal measurements: 10.6 x 4.1 x 4.7 cm = volume: 105.6 mL. Normal renal cortical thickness and echogenicity. No hydronephrosis. There is a 9 x 7 x 7 mm hypoechoic mass midpole left kidney.  Bladder:  Appears normal for degree of bladder distention.  Other:  None.  IMPRESSION: 1. No hydronephrosis. 2. There is a 9 mm hypoechoic mass midpole left kidney. This may represent a complicated cyst. Recommend follow-up renal ultrasound in 6 months to assess for stability.  Electronically Signed   By: Annia Belt M.D.   On: 12/26/2022 15:29 Note: Reviewed        Physical Exam  General appearance: Well nourished, well developed, and well  hydrated. In no apparent acute distress Mental status: Alert, oriented x 3 (person, place, & time)       Respiratory: No evidence of acute respiratory distress Eyes: PERLA Vitals: There were no vitals taken for this visit. BMI: Estimated body mass index is 35.61 kg/m as calculated from the following:   Height as of 12/25/22: 5\' 3"  (1.6 m).   Weight as of 12/25/22: 201 lb (91.2 kg). Ideal: Patient weight not recorded  Assessment   Diagnosis Status  1. Chronic lower extremity pain (1ry area of Pain) (Bilateral)   2. Chronic hip pain (2ry area of Pain) (Bilateral) (R>L)   3. Chronic low back pain (3ry area of Pain) (Bilateral) w/ sciatica (Bilateral)   4. Lumbosacral facet syndrome   5. Chronic upper back pain   6. Chronic knee pain (  Bilateral)   7. Chronic pain syndrome   8. Pharmacologic therapy   9. Chronic use of opiate for therapeutic purpose   10. Encounter for medication management   11. Encounter for chronic pain management    Controlled Controlled Controlled   Updated Problems: No problems updated.  Plan of Care  Problem-specific:  Assessment and Plan            Ms. BERDINE MCCLARAN has a current medication list which includes the following long-term medication(s): albuterol, apixaban, ezetimibe, fluticasone, hydrocodone-acetaminophen, insulin lispro, levothyroxine, lisinopril, nitroglycerin, pantoprazole, sucralfate, and torsemide.  Pharmacotherapy (Medications Ordered): No orders of the defined types were placed in this encounter.  Orders:  No orders of the defined types were placed in this encounter.  Follow-up plan:   No follow-ups on file.      Interventional Therapies  Risk  Complexity Considerations:   NOTE: COUMADIN Anticoagulation (Stop: 5 days  Restart: 2 hours) Decreased GFR Obstructive sleep apnea   Planned  Pending:      Under consideration:   Diagnostic midline caudal ESI #1 + diagnostic epidurogram  Diagnostic bilateral IA hip joint  injection #1  Diagnostic right L3-4 LESI #1  Diagnostic right L4 TFESI #1  Diagnostic bilateral L3 TFESI #1  Diagnostic bilateral lumbar facet MBB #1    Completed:   Diagnostic/therapeutic midline L4-5 LESI x2 (08/04/2021) (100/100/100/100)    Completed by Dr. Mariah Milling [facility]):   Diagnostic bilateral L4 TFESI x1 (09/12/2019 - Dr. Mariah Milling [[facility]])    Therapeutic  Palliative (PRN) options:   Diagnostic/therapeutic midline L4-5 LESI      Recent Visits Date Type Provider Dept  10/18/22 Office Visit Delano Metz, MD [facility]  Showing recent visits within past 90 days and meeting all other requirements Future Appointments Date Type Provider Dept  01/17/23 Appointment Delano Metz, MD [facility]  Showing future appointments within next 90 days and meeting all other requirements  I discussed the assessment and treatment plan with the patient. The patient was provided an opportunity to ask questions and all were answered. The patient agreed with the plan and demonstrated an understanding of the instructions.  Patient advised to call back or seek an in-person evaluation if the symptoms or condition worsens.  Duration of encounter: *** minutes.  Total time on encounter, as per AMA guidelines included both the face-to-face and non-face-to-face time personally spent by the physician and/or other qualified health care professional(s) on the day of the encounter (includes time in activities that require the physician or other qualified health care professional and does not include time in activities normally performed by clinical staff). Physician's time may include the following activities when performed: Preparing to see the patient (e.g., pre-charting review of records, searching for previously ordered imaging, lab work, and nerve conduction tests) Review of prior analgesic pharmacotherapies. Reviewing PMP Interpreting ordered tests (e.g., lab work, imaging, nerve  conduction tests) Performing post-procedure evaluations, including interpretation of diagnostic procedures Obtaining and/or reviewing separately obtained history Performing a medically appropriate examination and/or evaluation Counseling and educating the patient/family/caregiver Ordering medications, tests, or procedures Referring and communicating with other health care professionals (when not separately reported) Documenting clinical information in the electronic or other health record Independently interpreting results (not separately reported) and communicating results to the patient/ family/caregiver Care coordination (not separately reported)  Note by: Oswaldo Done, MD Date: 01/17/2023; Time: 12:19 PM

## 2023-01-16 NOTE — Patient Instructions (Signed)

## 2023-01-17 ENCOUNTER — Ambulatory Visit: Payer: Medicare Other | Attending: Pain Medicine | Admitting: Pain Medicine

## 2023-01-17 ENCOUNTER — Encounter: Payer: Self-pay | Admitting: Pain Medicine

## 2023-01-17 DIAGNOSIS — G894 Chronic pain syndrome: Secondary | ICD-10-CM | POA: Insufficient documentation

## 2023-01-17 DIAGNOSIS — M5441 Lumbago with sciatica, right side: Secondary | ICD-10-CM | POA: Diagnosis present

## 2023-01-17 DIAGNOSIS — M25561 Pain in right knee: Secondary | ICD-10-CM | POA: Insufficient documentation

## 2023-01-17 DIAGNOSIS — Z79891 Long term (current) use of opiate analgesic: Secondary | ICD-10-CM | POA: Diagnosis present

## 2023-01-17 DIAGNOSIS — Z79899 Other long term (current) drug therapy: Secondary | ICD-10-CM | POA: Diagnosis present

## 2023-01-17 DIAGNOSIS — M25552 Pain in left hip: Secondary | ICD-10-CM | POA: Insufficient documentation

## 2023-01-17 DIAGNOSIS — M25551 Pain in right hip: Secondary | ICD-10-CM | POA: Insufficient documentation

## 2023-01-17 DIAGNOSIS — M549 Dorsalgia, unspecified: Secondary | ICD-10-CM | POA: Insufficient documentation

## 2023-01-17 DIAGNOSIS — M47817 Spondylosis without myelopathy or radiculopathy, lumbosacral region: Secondary | ICD-10-CM | POA: Insufficient documentation

## 2023-01-17 DIAGNOSIS — M5442 Lumbago with sciatica, left side: Secondary | ICD-10-CM | POA: Diagnosis present

## 2023-01-17 DIAGNOSIS — G8929 Other chronic pain: Secondary | ICD-10-CM | POA: Insufficient documentation

## 2023-01-17 DIAGNOSIS — M79605 Pain in left leg: Secondary | ICD-10-CM | POA: Insufficient documentation

## 2023-01-17 DIAGNOSIS — M79604 Pain in right leg: Secondary | ICD-10-CM | POA: Diagnosis present

## 2023-01-17 DIAGNOSIS — M25562 Pain in left knee: Secondary | ICD-10-CM | POA: Insufficient documentation

## 2023-01-17 MED ORDER — HYDROCODONE-ACETAMINOPHEN 10-325 MG PO TABS: 1.0000 | ORAL_TABLET | Freq: Three times a day (TID) | ORAL | 0 refills | Status: DC | PRN

## 2023-01-17 NOTE — Progress Notes (Signed)
Nursing Pain Medication Assessment:  Safety precautions to be maintained throughout the outpatient stay will include: orient to surroundings, keep bed in low position, maintain call bell within reach at all times, provide assistance with transfer out of bed and ambulation.  Medication Inspection Compliance: Pill count conducted under aseptic conditions, in front of the patient. Neither the pills nor the bottle was removed from the patient's sight at any time. Once count was completed pills were immediately returned to the patient in their original bottle.  Medication: Hydrocodone/APAP Pill/Patch Count:  18 of 60 pills remain Pill/Patch Appearance: Markings consistent with prescribed medication Bottle Appearance: Standard pharmacy container. Clearly labeled. Filled Date: 80 / 22 / 2024 Last Medication intake:  Today

## 2023-04-12 NOTE — Progress Notes (Signed)
 PROVIDER NOTE: Information contained herein reflects review and annotations entered in association with encounter. Interpretation of such information and data should be left to medically-trained personnel. Information provided to patient can be located elsewhere in the medical record under "Patient Instructions". Document created using STT-dictation technology, any transcriptional errors that may result from process are unintentional.    Patient: Tracy Mann  Service Category: E/M  Provider: Oswaldo Done, MD  DOB: 06/13/1933  DOS: 04/16/2023  Referring Provider: Bosie Clos, MD  MRN: [id number]  Specialty: Interventional Pain Management  PCP: Bosie Clos, MD  Type: Established Patient  Setting: Ambulatory outpatient    Location: Office  Delivery: Face-to-face     HPI  [name], a 89 y.o. year old female, is here today because of her Chronic pain syndrome [G89.4]. Tracy Mann primary complain today is Leg Pain (Bilateral )  Pertinent problems: Tracy Mann has Cervical muscle strain; DDD (degenerative disc disease), lumbosacral; Chronic venous insufficiency; Chronic pain syndrome; Abnormal MRI, lumbar spine (08/22/2019); Lumbar facet hypertrophy (Multilevel) (Bilateral); Lumbar central spinal stenosis (SEVERE) (L3-4) with neurogenic claudication; Grade 1 Anterolisthesis of lumbar spine (L4/L5); Lumbar facet arthropathy (L3-4 right-sided facet edema/joint effusion); Lumbar lateral recess stenosis (L4-5) (Right); Chronic low back pain (3ry area of Pain) (Bilateral) w/ sciatica (Bilateral); Lumbosacral radiculopathy at L5 (Bilateral); Chronic lower extremity pain (1ry area of Pain) (Bilateral); Lumbosacral facet syndrome; Chronic hip pain (2ry area of Pain) (Bilateral) (R>L); Osteoarthritis involving multiple joints; Osteoarthritis of knee (Right); Osteoarthritis of facet joint of lumbar spine; Osteoarthritis of lumbar spine; Bilateral lower extremity edema; Cellulitis of right lower  leg; Chronic knee pain (Bilateral); Peripheral vascular disease (HCC) (lower extremity) (Bilateral); Cervicalgia; Chronic upper back pain; and Lumbosacral spondylosis with radiculopathy on their pertinent problem list. Pain Assessment: Severity of Chronic pain is reported as a 7 /10. Location: Leg Anterior, Right, Left/From mid thigh to feet bilateral (below the knee pain is worse than above the knee). Onset: More than a month ago. Quality: Constant ("Irritating"). Timing: Constant. Modifying factor(s): Pain medication and resting. Vitals:  height is 5\' 3"  (1.6 m) and weight is 198 lb (89.8 kg). Her temporal temperature is 97.7 F (36.5 C). Her blood pressure is 138/80 and her pulse is 79. Her respiration is 16 and oxygen saturation is 97%.  BMI: Estimated body mass index is 35.07 kg/m as calculated from the following:   Height as of this encounter: 5\' 3"  (1.6 m).   Weight as of this encounter: 198 lb (89.8 kg). Last encounter: 01/17/2023. Last procedure: Visit date not found.  Reason for encounter: medication management.  The patient indicates doing well with the current medication regimen. No adverse reactions or side effects reported to the medications.   Discussed the use of AI scribe software for clinical note transcription with the patient, who gave verbal consent to proceed.  History of Present Illness   Tracy Mann is an 89 year old female who presents with pain management follow-up.  She experiences chronic pain primarily from the knee down, with occasional discomfort in the upper portion of the leg. The pain has remained at a steady level of seven since her last visit, whereas it typically fluctuates.  Her current pain medication effectively alleviates the pain, stating 'it takes it out.' No adverse side effects from her pain medication, mentioning only a 'good side effect.'  She confirms that she is still using the same pharmacy, [facility], for her prescriptions and has been  compliant with her  medication regimen, as evidenced by her bringing her pills for counting.  She also confirms regular follow-ups with her primary care provider.     RTCB: 07/20/2023   Pharmacotherapy Assessment  Analgesic: Hydrocodone/APAP 10/325, (see the 05/09/2021 note) using 1.675 pills/day. MME/day: 10-20 mg/day   Monitoring: [place] PMP: PDMP reviewed during this encounter.       Pharmacotherapy: No side-effects or adverse reactions reported. Compliance: No problems identified. Effectiveness: Clinically acceptable.  Tracy Iba, RN  04/16/2023 12:51 PM  Sign when Signing Visit Nursing Pain Medication Assessment:  Safety precautions to be maintained throughout the outpatient stay will include: orient to surroundings, keep bed in low position, maintain call bell within reach at all times, provide assistance with transfer out of bed and ambulation.  Medication Inspection Compliance: Pill count conducted under aseptic conditions, in front of the patient. Neither the pills nor the bottle was removed from the patient's sight at any time. Once count was completed pills were immediately returned to the patient in their original bottle.  Medication: Hydrocodone/APAP Pill/Patch Count:  20 of 60 pills remain Pill/Patch Appearance: Markings consistent with prescribed medication Bottle Appearance: Standard pharmacy container. Clearly labeled. Filled Date: 02 / 20 / 2025 Last Medication intake:  Today    No results found for: "CBDTHCR" No results found for: "D8THCCBX" No results found for: "D9THCCBX"  UDS:  Summary  Date Value Ref Range Status  07/19/2022 Note  Final    Comment:    ==================================================================== [employer] 13 (MW) ==================================================================== Test                             Result       Flag       Units  Drug Present and Declared for Prescription Verification   Lorazepam                       713          EXPECTED   ng/mg creat    Source of lorazepam is a scheduled prescription medication.    Hydrocodone                    1207         EXPECTED   ng/mg creat   Hydromorphone                  83           EXPECTED   ng/mg creat   Dihydrocodeine                 293          EXPECTED   ng/mg creat   Norhydrocodone                 902          EXPECTED   ng/mg creat    Sources of hydrocodone include scheduled prescription medications.    Hydromorphone, dihydrocodeine and norhydrocodone are expected    metabolites of hydrocodone. Hydromorphone and dihydrocodeine are    also available as scheduled prescription medications.  ==================================================================== Test                      Result    Flag   Units      Ref Range   Creatinine              89  mg/dL      >=91 ==================================================================== Declared Medications:  The flagging and interpretation on this report are based on the  following declared medications.  Unexpected results may arise from  inaccuracies in the declared medications.   **Note: The testing scope of this panel includes these medications:   Hydrocodone (Norco)  Lorazepam (Ativan)   **Note: The testing scope of this panel does not include the  following reported medications:   Acetaminophen (Norco)  Albuterol (Ventolin HFA)  Apixaban (Eliquis)  Doxycycline  Ezetimibe (Zetia)  Flavoxate (Urispas)  Fluticasone (Flonase)  Insulin (Lantus)  Levothyroxine (Synthroid)  Lisinopril (Zestril)  Magnesium  Metolazone (Zaroxolyn)  Naloxone (Narcan)  Nitrofurantoin (Macrodantin)  Nitroglycerin (Nitrostat)  Ondansetron (Zofran)  Pantoprazole (Protonix)  Phenazopyridine (Pyridium)  Polyethylene Glycol (GlycoLax)  Ropinirole (Requip)  Torsemide (Demadex) ==================================================================== For clinical consultation, please call [phone]. ====================================================================       ROS  Constitutional: Denies any fever or chills Gastrointestinal: No reported hemesis, hematochezia, vomiting, or acute GI distress Musculoskeletal: Denies any acute onset joint swelling, redness, loss of ROM, or weakness Neurological: No reported episodes of acute onset apraxia, aphasia, dysarthria, agnosia, amnesia, paralysis, loss of coordination, or loss of consciousness  Medication Review  HYDROcodone-acetaminophen, Insulin Pen Needle, LORazepam, Magnesium, albuterol, apixaban, estradiol, ezetimibe, flavoxATE, fluticasone, insulin glargine, insulin lispro, levothyroxine, lisinopril, mupirocin cream, naloxone, nitroGLYCERIN, nitrofurantoin, ondansetron, pantoprazole, sucralfate, and torsemide  History Review  Allergy: Tracy Mann is allergic to clindamycin/lincomycin, duloxetine, paroxetine, sulfa antibiotics, cephalexin, clarithromycin, diphenhydramine, estrogens conjugated, estrogens conjugated, and nitrofurantoin. Drug: Tracy Mann  reports no history of drug use. Alcohol:  reports no history of alcohol use. Tobacco:  reports that she has never smoked. She has never used smokeless tobacco. Social: Tracy Mann  reports that she has never smoked. She has never used smokeless tobacco. She reports that she does not drink alcohol and does not use drugs. Medical:  has a past medical history of Acute myocardial infarction of inferior wall (HCC), Arthritis, Chronic anticoagulation, Chronic atrial fibrillation (HCC), Chronic combined systolic and diastolic CHF (congestive heart failure) (HCC), Essential hypertension, Hyperlipidemia, mixed, Mixed Ischemic/Nonischemic Cardiomyopathy, Nonobstructive coronary artery disease, Obesity, Thyroid disease, Type II diabetes mellitus (HCC), Venous insufficiency, and Vitamin D deficiency. Surgical: Ms. Cavazos  has a past surgical history that includes Vesicovaginal fistula closure w/  TAH (1996); Tubal ligation (1968); Cholecystectomy (1999); and Cardiac catheterization (11/2012). Family: family history includes Heart attack in her mother; Heart disease in her brother, brother, and mother; Thyroid disease in her father.  Laboratory Chemistry Profile   Renal Lab Results  Component Value Date   BUN 32 (H) 11/07/2022   CREATININE 1.39 (H) 11/07/2022   BCR 23 03/15/2022   GFRAA 73 12/31/2019   GFRNONAA 37 (L) 11/07/2022    Hepatic Lab Results  Component Value Date   AST 23 11/06/2022   ALT 18 11/06/2022   ALBUMIN 3.2 (L) 11/06/2022   ALKPHOS 80 11/06/2022    Electrolytes Lab Results  Component Value Date   NA 136 11/07/2022   K 3.9 11/07/2022   CL 100 11/07/2022   CALCIUM 8.5 (L) 11/07/2022   MG 1.7 11/05/2022   PHOS 3.7 11/05/2022    Bone Lab Results  Component Value Date   25OHVITD1 43 11/26/2019   25OHVITD2 9.2 11/26/2019   25OHVITD3 34 11/26/2019    Inflammation (CRP: Acute Phase) (ESR: Chronic Phase) Lab Results  Component Value Date   CRP 4 11/26/2019   ESRSEDRATE 45 (H) 11/26/2019   LATICACIDVEN 1.4 10/31/2022  Note: Above Lab results reviewed.  Recent Imaging Review  US RENAL CLINICAL DATA:  Cystitis  EXAM: RENAL / URINARY TRACT ULTRASOUND COMPLETE  COMPARISON:  Renal ultrasound 11/06/2022  FINDINGS: Right Kidney:  Renal measurements: 9.5 x 3.2 x 4.7 cm = volume: 74.6 mL. Echogenicity within normal limits. No mass or hydronephrosis visualized.  Left Kidney:  Renal measurements: 10.6 x 4.1 x 4.7 cm = volume: 105.6 mL. Normal renal cortical thickness and echogenicity. No hydronephrosis. There is a 9 x 7 x 7 mm hypoechoic mass midpole left kidney.  Bladder:  Appears normal for degree of bladder distention.  Other:  None.  IMPRESSION: 1. No hydronephrosis. 2. There is a 9 mm hypoechoic mass midpole left kidney. This may represent a complicated cyst. Recommend follow-up renal ultrasound in 6 months to assess  for stability.  Electronically Signed   By: Annia Belt M.D.   On: 12/26/2022 15:29 Note: Reviewed        Physical Exam  General appearance: Well nourished, well developed, and well hydrated. In no apparent acute distress Mental status: Alert, oriented x 3 (person, place, & time)       Respiratory: No evidence of acute respiratory distress Eyes: PERLA Vitals: BP 138/80 (Patient Position: Sitting, Cuff Size: Normal)   Pulse 79   Temp 97.7 F (36.5 C) (Temporal)   Resp 16   Ht 5\' 3"  (1.6 m)   Wt 198 lb (89.8 kg)   SpO2 97%   BMI 35.07 kg/m  BMI: Estimated body mass index is 35.07 kg/m as calculated from the following:   Height as of this encounter: 5\' 3"  (1.6 m).   Weight as of this encounter: 198 lb (89.8 kg). Ideal: Ideal body weight: 52.4 kg (115 lb 8.3 oz) Adjusted ideal body weight: 67.4 kg (148 lb 8.2 oz)  Assessment   Diagnosis Status  1. Chronic pain syndrome   2. Chronic lower extremity pain (1ry area of Pain) (Bilateral)   3. Chronic hip pain (2ry area of Pain) (Bilateral) (R>L)   4. Chronic low back pain (3ry area of Pain) (Bilateral) w/ sciatica (Bilateral)   5. Lumbosacral facet syndrome   6. Chronic upper back pain   7. Chronic knee pain (Bilateral)   8. Pharmacologic therapy   9. Chronic use of opiate for therapeutic purpose   10. Encounter for medication management   11. Encounter for chronic pain management    Controlled Controlled Controlled   Updated Problems: No problems updated.  Plan of Care  Problem-specific:  Assessment and Plan    Chronic lower extremity pain   Chronic pain persists primarily from the knee down, with occasional upper leg discomfort. Pain level remains at 7/10. Current medication effectively manages pain without adverse effects, though discomfort occurs if not taken on time. Continue current medication regimen and send prescriptions to the pharmacy for the next three months. Schedule a follow-up appointment in three  months.  Medication management   No adverse reactions or side effects from current pain medication. Urine drug screening and prescription monitoring are satisfactory. Pill count confirms compliance. Ensure continued compliance with the medication regimen.  Primary care follow-up   She maintains regular visits with her primary care provider for comprehensive health management. Encourage continued follow-up with the primary care provider.       Tracy Mann has a current medication list which includes the following long-term medication(s): albuterol, apixaban, ezetimibe, fluticasone, [START ON 04/21/2023] hydrocodone-acetaminophen, [START ON 05/21/2023] hydrocodone-acetaminophen, [START ON 06/20/2023] hydrocodone-acetaminophen, insulin  lispro, levothyroxine, lisinopril, nitroglycerin, pantoprazole, sucralfate, and torsemide.  Pharmacotherapy (Medications Ordered): Meds ordered this encounter  Medications   HYDROcodone-acetaminophen (NORCO) 10-325 MG tablet    Sig: Take 1 tablet by mouth every 8 (eight) hours as needed for severe pain (pain score 7-10). Must last 30 days.    Dispense:  60 tablet    Refill:  0    DO NOT: delete (not duplicate); no partial-fill (will deny script to complete), no refill request (F/U required). DISPENSE: 1 day early if closed on fill date. WARN: No CNS-depressants within 8 hrs of med.   HYDROcodone-acetaminophen (NORCO) 10-325 MG tablet    Sig: Take 1 tablet by mouth every 8 (eight) hours as needed for severe pain (pain score 7-10). Must last 30 days.    Dispense:  60 tablet    Refill:  0    DO NOT: delete (not duplicate); no partial-fill (will deny script to complete), no refill request (F/U required). DISPENSE: 1 day early if closed on fill date. WARN: No CNS-depressants within 8 hrs of med.   HYDROcodone-acetaminophen (NORCO) 10-325 MG tablet    Sig: Take 1 tablet by mouth every 8 (eight) hours as needed for severe pain (pain score 7-10). Must last 30 days.     Dispense:  60 tablet    Refill:  0    DO NOT: delete (not duplicate); no partial-fill (will deny script to complete), no refill request (F/U required). DISPENSE: 1 day early if closed on fill date. WARN: No CNS-depressants within 8 hrs of med.   Orders:  No orders of the defined types were placed in this encounter.  Follow-up plan:   Return in about 3 months (around 07/20/2023) for Eval-day (M,W), (F2F), (MM).      Interventional Therapies  Risk  Complexity Considerations:   NOTE: COUMADIN Anticoagulation (Stop: 5 days  Restart: 2 hours) Decreased GFR Obstructive sleep apnea   Planned  Pending:      Under consideration:   Diagnostic midline caudal ESI #1 + diagnostic epidurogram  Diagnostic bilateral IA hip joint injection #1  Diagnostic right L3-4 LESI #1  Diagnostic right L4 TFESI #1  Diagnostic bilateral L3 TFESI #1  Diagnostic bilateral lumbar facet MBB #1    Completed:   Diagnostic/therapeutic midline L4-5 LESI x2 (08/04/2021) (100/100/100/100)    Completed by Dr. Mariah Milling [facility]):   Diagnostic bilateral L4 TFESI x1 (09/12/2019 - Dr. Mariah Milling [[facility]])    Therapeutic  Palliative (PRN) options:   Diagnostic/therapeutic midline L4-5 LESI      Recent Visits Date Type Provider Dept  01/17/23 Office Visit Delano Metz, MD [facility]  Showing recent visits within past 90 days and meeting all other requirements Today's Visits Date Type Provider Dept  04/16/23 Office Visit Delano Metz, MD [facility]  Showing today's visits and meeting all other requirements Future Appointments No visits were found meeting these conditions. Showing future appointments within next 90 days and meeting all other requirements  I discussed the assessment and treatment plan with the patient. The patient was provided an opportunity to ask questions and all were answered. The patient agreed with the plan and demonstrated an understanding of the  instructions.  Patient advised to call back or seek an in-person evaluation if the symptoms or condition worsens.  Duration of encounter: 30 minutes.  Total time on encounter, as per AMA guidelines included both the face-to-face and non-face-to-face time personally spent by the physician and/or other qualified health care professional(s) on the day of  the encounter (includes time in activities that require the physician or other qualified health care professional and does not include time in activities normally performed by clinical staff). Physician's time may include the following activities when performed: Preparing to see the patient (e.g., pre-charting review of records, searching for previously ordered imaging, lab work, and nerve conduction tests) Review of prior analgesic pharmacotherapies. Reviewing PMP Interpreting ordered tests (e.g., lab work, imaging, nerve conduction tests) Performing post-procedure evaluations, including interpretation of diagnostic procedures Obtaining and/or reviewing separately obtained history Performing a medically appropriate examination and/or evaluation Counseling and educating the patient/family/caregiver Ordering medications, tests, or procedures Referring and communicating with other health care professionals (when not separately reported) Documenting clinical information in the electronic or other health record Independently interpreting results (not separately reported) and communicating results to the patient/ family/caregiver Care coordination (not separately reported)  Note by: Oswaldo Done, MD Date: 04/16/2023; Time: 1:28 PM

## 2023-04-12 NOTE — Patient Instructions (Signed)

## 2023-04-16 ENCOUNTER — Encounter: Payer: Self-pay | Admitting: Pain Medicine

## 2023-04-16 ENCOUNTER — Ambulatory Visit: Payer: Medicare Other | Attending: Pain Medicine | Admitting: Pain Medicine

## 2023-04-16 VITALS — BP 138/80 | HR 79 | Temp 97.7°F | Resp 16 | Ht 63.0 in | Wt 198.0 lb

## 2023-04-16 DIAGNOSIS — G894 Chronic pain syndrome: Secondary | ICD-10-CM | POA: Insufficient documentation

## 2023-04-16 DIAGNOSIS — M79604 Pain in right leg: Secondary | ICD-10-CM | POA: Insufficient documentation

## 2023-04-16 DIAGNOSIS — M25551 Pain in right hip: Secondary | ICD-10-CM | POA: Insufficient documentation

## 2023-04-16 DIAGNOSIS — Z79891 Long term (current) use of opiate analgesic: Secondary | ICD-10-CM | POA: Diagnosis present

## 2023-04-16 DIAGNOSIS — M25562 Pain in left knee: Secondary | ICD-10-CM | POA: Diagnosis present

## 2023-04-16 DIAGNOSIS — Z79899 Other long term (current) drug therapy: Secondary | ICD-10-CM | POA: Insufficient documentation

## 2023-04-16 DIAGNOSIS — M5441 Lumbago with sciatica, right side: Secondary | ICD-10-CM | POA: Diagnosis present

## 2023-04-16 DIAGNOSIS — M47817 Spondylosis without myelopathy or radiculopathy, lumbosacral region: Secondary | ICD-10-CM | POA: Diagnosis present

## 2023-04-16 DIAGNOSIS — M25561 Pain in right knee: Secondary | ICD-10-CM | POA: Insufficient documentation

## 2023-04-16 DIAGNOSIS — M79605 Pain in left leg: Secondary | ICD-10-CM | POA: Insufficient documentation

## 2023-04-16 DIAGNOSIS — G8929 Other chronic pain: Secondary | ICD-10-CM | POA: Insufficient documentation

## 2023-04-16 DIAGNOSIS — M549 Dorsalgia, unspecified: Secondary | ICD-10-CM | POA: Diagnosis present

## 2023-04-16 DIAGNOSIS — M25552 Pain in left hip: Secondary | ICD-10-CM | POA: Insufficient documentation

## 2023-04-16 DIAGNOSIS — M5442 Lumbago with sciatica, left side: Secondary | ICD-10-CM | POA: Diagnosis present

## 2023-04-16 MED ORDER — HYDROCODONE-ACETAMINOPHEN 10-325 MG PO TABS: 1.0000 | ORAL_TABLET | Freq: Three times a day (TID) | ORAL | 0 refills | Status: DC | PRN

## 2023-04-16 NOTE — Progress Notes (Signed)
 Nursing Pain Medication Assessment:  Safety precautions to be maintained throughout the outpatient stay will include: orient to surroundings, keep bed in low position, maintain call bell within reach at all times, provide assistance with transfer out of bed and ambulation.  Medication Inspection Compliance: Pill count conducted under aseptic conditions, in front of the patient. Neither the pills nor the bottle was removed from the patient's sight at any time. Once count was completed pills were immediately returned to the patient in their original bottle.  Medication: Hydrocodone/APAP Pill/Patch Count:  20 of 60 pills remain Pill/Patch Appearance: Markings consistent with prescribed medication Bottle Appearance: Standard pharmacy container. Clearly labeled. Filled Date: 02 / 20 / 2025 Last Medication intake:  Today

## 2023-04-18 ENCOUNTER — Other Ambulatory Visit (HOSPITAL_COMMUNITY): Payer: Self-pay

## 2023-04-18 ENCOUNTER — Telehealth: Payer: Self-pay | Admitting: Cardiovascular Disease

## 2023-04-18 ENCOUNTER — Encounter: Payer: Medicare Other | Admitting: Pain Medicine

## 2023-04-18 MED ORDER — NITROGLYCERIN 0.4 MG SL SUBL
0.4000 mg | SUBLINGUAL_TABLET | SUBLINGUAL | 3 refills | Status: DC | PRN
  Filled 2023-04-18: qty 25, 8d supply, fill #0

## 2023-04-18 NOTE — Telephone Encounter (Signed)
   Pt c/o of Chest Pain: STAT if active CP, including tightness, pressure, jaw pain, radiating pain to shoulder/upper arm/back, CP unrelieved by Nitro. Symptoms reported of SOB, nausea, vomiting, sweating.  1. Are you having CP right now? No   2. Are you experiencing any other symptoms (ex. SOB, nausea, vomiting, sweating)? Tired, and has not felt well for about a week.    3. Is your CP continuous or coming and going? Coming and going    4. Have you taken Nitroglycerin? No, prescription is old   5. How long have you been experiencing CP? Started last week    6. If NO CP at time of call then end call with telling Pt to call back or call 911 if Chest pain returns prior to return call from triage team.    Pain is not severe, comes and leaves quickly on left side. Occurred twice today. Is requesting a new script of Nitroglycerin. Please advise.

## 2023-04-18 NOTE — Telephone Encounter (Signed)
*  STAT* If patient is at the pharmacy, call can be transferred to refill team.   1. Which medications need to be refilled? (please list name of each medication and dose if known) nitroGLYCERIN (NITROSTAT) 0.4 MG SL tablet    2. Would you like to learn more about the convenience, safety, & potential cost savings by using the [facility]?      3. Are you open to using the [facility] (Type [facility]. ).   4. Which pharmacy/location (including street and city if local pharmacy) is medication to be sent to? [id number] - [place] ([address]), [place] - [address]    5. Do they need a 30 day or 90 day supply? 90

## 2023-04-18 NOTE — Telephone Encounter (Signed)
Attempted to reach the patient. Was unable to leave a message 

## 2023-04-19 ENCOUNTER — Other Ambulatory Visit: Payer: Self-pay

## 2023-04-19 ENCOUNTER — Other Ambulatory Visit (HOSPITAL_COMMUNITY): Payer: Self-pay

## 2023-04-19 NOTE — Telephone Encounter (Signed)
 Attempted to reach the patient. Unable to leave a message. nitroglycerin was sent in yesterday.

## 2023-04-19 NOTE — Telephone Encounter (Signed)
 Patient is following up. Medication was sent to the incorrect pharmacy and patient is very upset. Medication went to [facility], but it should've gone to  [facility] -  ([address]), [place] - [address]  .

## 2023-04-20 ENCOUNTER — Other Ambulatory Visit: Payer: Self-pay

## 2023-04-20 MED ORDER — NITROGLYCERIN 0.4 MG SL SUBL: 0.4000 mg | SUBLINGUAL_TABLET | SUBLINGUAL | 3 refills | Status: AC | PRN

## 2023-04-20 NOTE — Telephone Encounter (Signed)
 Called patient and left detailed message per DPR. Left message with the following from Dr. Mariah Milling.  Could take extra torsemide or a dose of metolazone if leg swelling gets worse through the weekend Thx TGollan   Asked patient to call back with any questions.

## 2023-04-20 NOTE — Telephone Encounter (Signed)
 Called patient, she states she hardly ever uses the nitroglycerin, but she was told to always have it on hand. She does believe it was indigestion during this episode. It did improve. Advised that medication was sent to correct pharmacy.   Mentioned being short of breath with exertion, feeling fatigued, and just not feeling like herself. States she has had this for a few days not long. She did check her BP/HR. Reading for today below:  125/56 HR- 56  Patient states her HR normally is in the 70's and 80's. She does have some lower extremity swelling on her left leg. Patient advised to monitor BP/HR over the weekend, continue to see if it stays low. Patient does take her medications at night. Patient aware to elevate legs to see if swelling does improve. She did schedule her 6 month follow up with Dr.[name]- 06/18/2023.   Patient did appreciate call back- advised I would send a message to Dr.[name] to review if any other changes or recommendations.

## 2023-04-23 NOTE — Telephone Encounter (Signed)
 Called and spoke with patient. Notified patient of the following from Dr. Mariah Milling.  She used to have metolazone 2.5 mg daily as needed I think it fell off her list when she stopped using it If she feels like she needs it again, we can put it back on We have previously suggested up to twice a week as needed for extra leg swelling to take with torsemide 40 daily Thx TGollan   Patient verbalizes understanding.

## 2023-05-02 ENCOUNTER — Other Ambulatory Visit (HOSPITAL_COMMUNITY): Payer: Self-pay

## 2023-06-04 ENCOUNTER — Other Ambulatory Visit: Payer: Self-pay

## 2023-06-04 ENCOUNTER — Encounter: Payer: Self-pay | Admitting: Emergency Medicine

## 2023-06-04 ENCOUNTER — Emergency Department: Admission: EM | Admit: 2023-06-04 | Discharge: 2023-06-05 | Disposition: A | Discharge status: Home / Self Care

## 2023-06-04 DIAGNOSIS — L309 Dermatitis, unspecified: Secondary | ICD-10-CM | POA: Insufficient documentation

## 2023-06-04 DIAGNOSIS — I2510 Atherosclerotic heart disease of native coronary artery without angina pectoris: Secondary | ICD-10-CM | POA: Insufficient documentation

## 2023-06-04 DIAGNOSIS — I110 Hypertensive heart disease with heart failure: Secondary | ICD-10-CM | POA: Insufficient documentation

## 2023-06-04 DIAGNOSIS — L03115 Cellulitis of right lower limb: Secondary | ICD-10-CM

## 2023-06-04 DIAGNOSIS — E119 Type 2 diabetes mellitus without complications: Secondary | ICD-10-CM | POA: Diagnosis not present

## 2023-06-04 DIAGNOSIS — I509 Heart failure, unspecified: Secondary | ICD-10-CM | POA: Insufficient documentation

## 2023-06-04 DIAGNOSIS — I4891 Unspecified atrial fibrillation: Secondary | ICD-10-CM | POA: Diagnosis not present

## 2023-06-04 LAB — COMPREHENSIVE METABOLIC PANEL WITH GFR
ALT: 10 U/L (ref 0–44)
AST: 19 U/L (ref 15–41)
Albumin: 3.9 g/dL (ref 3.5–5.0)
Alkaline Phosphatase: 69 U/L (ref 38–126)
Anion gap: 8 (ref 5–15)
BUN: 61 mg/dL — ABNORMAL HIGH (ref 8–23)
CO2: 23 mmol/L (ref 22–32)
Calcium: 8.8 mg/dL — ABNORMAL LOW (ref 8.9–10.3)
Chloride: 109 mmol/L (ref 98–111)
Creatinine, Ser: 1.96 mg/dL — ABNORMAL HIGH (ref 0.44–1.00)
GFR, Estimated: 24 mL/min — ABNORMAL LOW (ref >60)
Glucose, Bld: 142 mg/dL — ABNORMAL HIGH (ref 70–99)
Potassium: 4.5 mmol/L (ref 3.5–5.1)
Sodium: 140 mmol/L (ref 135–145)
Total Bilirubin: 0.7 mg/dL (ref 0.0–1.2)
Total Protein: 7.5 g/dL (ref 6.5–8.1)

## 2023-06-04 LAB — CBC WITH DIFFERENTIAL/PLATELET
Abs Immature Granulocytes: 0.03 10*3/uL (ref 0.00–0.07)
Basophils Absolute: 0.1 10*3/uL (ref 0.0–0.1)
Basophils Relative: 1 %
Eosinophils Absolute: 0.3 10*3/uL (ref 0.0–0.5)
Eosinophils Relative: 3 %
HCT: 34.4 % — ABNORMAL LOW (ref 36.0–46.0)
Hemoglobin: 10.9 g/dL — ABNORMAL LOW (ref 12.0–15.0)
Immature Granulocytes: 0 %
Lymphocytes Relative: 18 %
Lymphs Abs: 1.7 10*3/uL (ref 0.7–4.0)
MCH: 29.9 pg (ref 26.0–34.0)
MCHC: 31.7 g/dL (ref 30.0–36.0)
MCV: 94.5 fL (ref 80.0–100.0)
Monocytes Absolute: 0.6 10*3/uL (ref 0.1–1.0)
Monocytes Relative: 7 %
Neutro Abs: 6.9 10*3/uL (ref 1.7–7.7)
Neutrophils Relative %: 71 %
Platelets: 220 10*3/uL (ref 150–400)
RBC: 3.64 MIL/uL — ABNORMAL LOW (ref 3.87–5.11)
RDW: 12.5 % (ref 11.5–15.5)
WBC: 9.6 10*3/uL (ref 4.0–10.5)
nRBC: 0 % (ref 0.0–0.2)

## 2023-06-04 LAB — LACTIC ACID, PLASMA: Lactic Acid, Venous: 1.3 mmol/L (ref 0.5–1.9)

## 2023-06-04 MED ORDER — HYDROCORTISONE 0.5 % EX CREA: 1.0000 | TOPICAL_CREAM | Freq: Two times a day (BID) | CUTANEOUS | 0 refills | Status: AC

## 2023-06-04 MED ORDER — DOXYCYCLINE HYCLATE 100 MG PO TABS: 100.0000 mg | ORAL_TABLET | Freq: Two times a day (BID) | ORAL | 0 refills | Status: AC

## 2023-06-04 NOTE — Discharge Instructions (Addendum)
 Your evaluation in the emergency department was overall reassuring.  Fortunately we saw no evidence of systemic infection, but it is possible you may be developing an early infection around the wound on your right leg -- I have started you on antibiotics to treat this.  I also prescribed you a topical steroid to apply to your bilateral arms.  Follow-up with your primary care doctor and dermatologist for reevaluation, and return to the emergency department with any new or worsening symptoms.

## 2023-06-04 NOTE — ED Provider Notes (Signed)
 [facility] Provider Note    Event Date/Time   First MD Initiated Contact with Patient 06/04/23 2225     (approximate)   History   Wound Check  Patient to ED via POV for wounds to bilateral legs. Pt reports they are from mosquitos bites. Also has rash on both arms. Pt reports chills and overall not feeling well.    1 set of blood cultures sent to lab.   HPI Tracy Mann is a 89 y.o. female PMH chronic A-fib on anticoagulation, CHF, hypertension, T2DM, CAD presents for evaluation of rash - Present for about 2 days.  Noted that she had several mosquito bites to her bilateral legs, started scratching at them and has developed an ulcer on her right shin. - No fevers - Also recently developed bilateral upper extremity itchy rash - No recent new medications - Has had episodes of similar rashes in the past.  Does have a dermatologist she can follow with.   Per chart review, seen in clinic earlier today, thought to have likely solar dermatitis and mosquito bites.  Was, concern for possible infection so was sent to ED for eval.       Physical Exam   Triage Vital Signs: ED Triage Vitals [06/04/23 1538]  Encounter Vitals Group     BP (!) 146/87     Systolic BP Percentile      Diastolic BP Percentile      Pulse Rate 98     Resp 17     Temp 98.8 F (37.1 C)     Temp Source Oral     SpO2 93 %     Weight 198 lb (89.8 kg)     Height 5\' 3"  (1.6 m)     Head Circumference      Peak Flow      Pain Score 6     Pain Loc      Pain Education      Exclude from Growth Chart     Most recent vital signs: Vitals:   06/04/23 1538 06/05/23 0005  BP: (!) 146/87 131/86  Pulse: 98 74  Resp: 17 16  Temp: 98.8 F (37.1 C) 98.6 F (37 C)  SpO2: 93% 95%     General: Awake, no distress.  CV:  Good peripheral perfusion. RRR, RP 2+ HEENT:  No oral lesions seen Resp:  Normal effort. CTAB Abd:  No distention. Nontender to deep palpation  throughout Other:  Superficial ulcer to right shin with no purulence but some mild surrounding erythema.  Not warm, no significant tenderness to touch.  Left shin with scattered abrasions and mild surrounding erythema.  Bilateral upper extremities with raised erythematous rash that is pruritic, none confluent.   ED Results / Procedures / Treatments   Labs (all labs ordered are listed, but only abnormal results are displayed) Labs Reviewed  COMPREHENSIVE METABOLIC PANEL WITH GFR - Abnormal; Notable for the following components:      Result Value   Glucose, Bld 142 (*)    BUN 61 (*)    Creatinine, Ser 1.96 (*)    Calcium  8.8 (*)    GFR, Estimated 24 (*)    All other components within normal limits  CBC WITH DIFFERENTIAL/PLATELET - Abnormal; Notable for the following components:   RBC 3.64 (*)    Hemoglobin 10.9 (*)    HCT 34.4 (*)    All other components within normal limits  LACTIC ACID, PLASMA     EKG  N/a   RADIOLOGY N/a   PROCEDURES:  Critical Care performed: No  Procedures   MEDICATIONS ORDERED IN ED: Medications - No data to display   IMPRESSION / MDM / ASSESSMENT AND PLAN / ED COURSE  I reviewed the triage vital signs and the nursing notes.                              DDX/MDM/AP: Differential diagnosis includes, but is not limited to, bug bites with wounds from scratching, may have mild cellulitis starting on right shin.  Does appear to have a dermatitis of her bilateral upper extremities.  No history to suggest cutaneous drug reaction.  Highly doubt systemic infection.  Plan: - Screening labs - Plan for hydrocortisone  cream to lateral upper extremities - Will also start on doxycycline  for possible early cellulitis of right shin - Plan for close PMD follow-up  Patient's presentation is most consistent with acute presentation with potential threat to life or bodily function.    ED course below.  Labs reassuring, overall not consistent with systemic  infection.  Started on doxycycline  for right leg (has multiple antibiotic allergies listed) and hydrocortisone  cream for arms.  Plan for PMD follow-up.  ED return precautions in place.  Patient and family agree with plan.  Clinical Course as of 06/05/23 0048  Mon Jun 04, 2023  2239 CBC with no leukocytosis Lactate normal CMP within baseline range [MM]    Clinical Course User Index [MM] Collis Deaner, MD     FINAL CLINICAL IMPRESSION(S) / ED DIAGNOSES   Final diagnoses:  Cellulitis of right lower leg  Dermatitis     Rx / DC Orders   ED Discharge Orders          Ordered    doxycycline  (VIBRA -TABS) 100 MG tablet  2 times daily        06/04/23 2340    hydrocortisone  cream 0.5 %  2 times daily        06/04/23 2340             Note:  This document was prepared using Dragon voice recognition software and may include unintentional dictation errors.   Collis Deaner, MD 06/05/23 [phone]

## 2023-06-04 NOTE — ED Notes (Signed)
 Notified by patient family member that patient also has a rash and she felt rash was worsening.  To assess patient.  Patient c/o feeling like lips were swelling, no swelling noted, possibly some dry lips or raised red bumps to right side of mouth. Managing secretions well. Voice clear and strong.  NAD. Continue to monitor.

## 2023-06-04 NOTE — ED Triage Notes (Signed)
 Patient to ED via POV for wounds to bilateral legs. Pt reports they are from mosquitos bites. Also has rash on both arms. Pt reports chills and overall not feeling well.    1 set of blood cultures sent to lab.

## 2023-06-17 NOTE — Progress Notes (Signed)
 Cardiology Office Note  Date:  06/18/2023   ID:  Tracy, Mann December 30, 1933, MRN [id number]  PCP:  Tracy Barters, MD   Chief Complaint  Patient presents with   6 month follow up     Patient c/o weakness/fatigue, has chest pain & shortness of breath with exertion. Tracy Mann     HPI:  Tracy Mann is a 89 year old woman with a PMH significant for  chronic atrial fibrillation on eliquis  Cardiomyopathy Medication intolerance diastolic and systolic dysfunction, EF 40 to 45% on echo 01/2018  hypertension,  hyperlipidemia,  morbid obesity,   moderate pulmonary hypertension inferior wall myocardial infarction felt to be secondary to a thrombus from atrial fib in October 2007 at [facility],  episodes of chest pain  Ejection fraction 30 to 35%, moderately elevated right heart pressures, July 16, 2020 following COVID EF 30 to 35% on echo 05/18/21 who presents for followup Of her coronary artery disease and leg edema, persistent atrial fibrillation, cardiomyopathy  Last seen by myself nov 2024 Recently back from [place],  grand daughter graduated from med school  Seen in the hospital emergency room Jun 04, 2023 for wound check bilateral legs, mosquito bites Treated with ABX, doxycycline , changed to PCN Has 6 more days  Wound on legs b/l, bandage in place  Denies chest pain concerning for angina Breathing stable Chronic bilateral lower extremity edema, wears compression hose  Is to take torsemide  40 Daily CR 1.96, BUN 61, high end of her range HGB 10.9  EKG personally reviewed by myself on todays visit EKG Interpretation Date/Time:  Monday Jun 18 2023 15:25:14 EDT Ventricular Rate:  91 PR Interval:    QRS Duration:  130 QT Interval:  390 QTC Calculation: 479 R Axis:   -59  Text Interpretation: Atrial fibrillation Left axis deviation Left bundle branch block When compared with ECG of 25-Dec-2022 14:07, No significant change was found Confirmed by Tracy Mann  [phone]) on 06/18/2023 3:35:23 PM   In hospital 10/24 Cellulitis of right lower extremity  In the hospital 10/30/2022 until 11/01/2022 treated with vancomycin  and prescribed oral doxycycline    Prior medication intolerances Stopped metoprolol , had dizzy spell/near syncope intolerances to Entresto , carvedilol  unable to tolerate Farxiga , makes her sneeze, cough 30 minutes after taking it with a runny nose and eye watering  Prior imaging reviewed Echocardiogram April 19 with ejection fraction 30 to 35% Moderate to severely elevated right heart pressures Severe biatrial enlargement moderate to severe MR No dramatic changes compared to study dated June 22  Previous attempt to use Carvedilol  3.25 twice daily started, Entresto  24/26 unsuccessful secondary to intolerance Went back on lisinopril , stopped the coreg   COVID infection early 2022  Does not use her lymphedema compression pumps  HBA1C 9.1 on labs 12/2018 HBA1C 8.3 on 05/20/2019  PMH:   has a past medical history of Acute myocardial infarction of inferior wall (HCC), Arthritis, Chronic anticoagulation, Chronic atrial fibrillation (HCC), Chronic combined systolic and diastolic CHF (congestive heart failure) (HCC), Essential hypertension, Hyperlipidemia, mixed, Mixed Ischemic/Nonischemic Cardiomyopathy, Nonobstructive coronary artery disease, Obesity, Thyroid  disease, Type II diabetes mellitus (HCC), Venous insufficiency, and Vitamin D deficiency.  PSH:    Past Surgical History:  Procedure Laterality Date   CARDIAC CATHETERIZATION  11/2012   [facility];EF 45-50%   CHOLECYSTECTOMY  1999   TUBAL LIGATION  1968   VESICOVAGINAL FISTULA CLOSURE W/ TAH  1996    Current Outpatient Medications  Medication Sig Dispense Refill   albuterol  (VENTOLIN  HFA) 108 (90 Base) MCG/ACT  inhaler TAKE 2 PUFFS BY MOUTH EVERY 6 HOURS AS NEEDED FOR WHEEZE OR SHORTNESS OF BREATH (Patient taking differently: Inhale 2 puffs into the lungs every 6 (six) hours as needed  for shortness of breath or wheezing.) 8.5 each 2   amoxicillin -clavulanate (AUGMENTIN ) 875-125 MG tablet Take 1 tablet by mouth 2 (two) times daily.     apixaban  (ELIQUIS ) 5 MG TABS tablet Take 1 tablet (5 mg total) by mouth 2 (two) times daily. 180 tablet 3   estradiol  (ESTRACE ) 0.1 MG/GM vaginal cream Place 1 Applicatorful vaginally daily.     ezetimibe  (ZETIA ) 10 MG tablet Take 1 tablet (10 mg total) by mouth daily. 90 tablet 2   flavoxATE (URISPAS) 100 MG tablet Take 1 tablet (100 mg total) by mouth 2 (two) times daily as needed for bladder spasms. 60 tablet 11   fluticasone  (FLONASE ) 50 MCG/ACT nasal spray Place 2 sprays into both nostrils daily. 48 mL 3   HYDROcodone -acetaminophen  (NORCO) 10-325 MG tablet Take 1 tablet by mouth every 8 (eight) hours as needed for severe pain (pain score 7-10). Must last 30 days. 60 tablet 0   hydrocortisone  cream 0.5 % Apply 1 Application topically 2 (two) times daily. 30 g 0   insulin  glargine (LANTUS  SOLOSTAR) 100 UNIT/ML Solostar Pen Inject 18-24 Units into the skin at bedtime.     insulin  lispro (HUMALOG ) 100 UNIT/ML KwikPen Inject 10-25 Units into the skin in the morning, at noon, and at bedtime.     levothyroxine  (SYNTHROID ) 25 MCG tablet Take 25 mcg by mouth daily before breakfast.     lisinopril  (ZESTRIL ) 10 MG tablet Take 1 tablet (10 mg total) by mouth daily. 90 tablet 1   LORazepam  (ATIVAN ) 0.5 MG tablet Take 0.5 mg by mouth 2 (two) times daily.     Magnesium  500 MG CAPS Take 1,000 mg by mouth daily.      mupirocin  cream (BACTROBAN ) 2 % Apply topically 3 (three) times daily. 15 g 0   nitroGLYCERIN  (NITROSTAT ) 0.4 MG SL tablet Place 1 tablet (0.4 mg total) under the tongue every 5 (five) minutes as needed for chest pain. 25 tablet 3   ondansetron  (ZOFRAN ) 4 MG tablet Take 1 tablet (4 mg total) by mouth every 12 (twelve) hours as needed for nausea or vomiting. 60 tablet 2   pantoprazole  (PROTONIX ) 40 MG tablet Take 1 tablet (40 mg total) by mouth 2  (two) times daily before a meal. 60 tablet 0   sucralfate  (CARAFATE ) 1 GM/10ML suspension Take 10 mLs (1 g total) by mouth 4 (four) times daily -  with meals and at bedtime for 15 days. 600 mL 0   torsemide  (DEMADEX ) 20 MG tablet Take 2 tablets (40 mg total) by mouth daily. 180 tablet 3   HYDROcodone -acetaminophen  (NORCO) 10-325 MG tablet Take 1 tablet by mouth every 8 (eight) hours as needed for severe pain (pain score 7-10). Must last 30 days. (Patient not taking: Reported on 06/18/2023) 60 tablet 0   [START ON 06/20/2023] HYDROcodone -acetaminophen  (NORCO) 10-325 MG tablet Take 1 tablet by mouth every 8 (eight) hours as needed for severe pain (pain score 7-10). Must last 30 days. (Patient not taking: Reported on 06/18/2023) 60 tablet 0   Insulin  Pen Needle (RELION PEN NEEDLE 31G/8MM) 31G X 8 MM MISC Use with insulin  pen three times daily (Patient not taking: Reported on 06/18/2023) 100 each 12   naloxone  (NARCAN ) nasal spray 4 mg/0.1 mL Place 1 spray into the nose as needed for up to  365 doses (for opioid-induced respiratory depresssion). In case of emergency (overdose), spray once into each nostril. If no response within 3 minutes, repeat application and call 911. (Patient not taking: Reported on 06/18/2023) 1 each 0   No current facility-administered medications for this visit.    Allergies:   Clindamycin , Clindamycin /lincomycin, Duloxetine , Paroxetine , Sulfa antibiotics, Cephalexin , Clarithromycin, Diphenhydramine, Estrogens conjugated, Estrogens conjugated, and Nitrofurantoin    Social History:  The patient  reports that she has never smoked. She has never used smokeless tobacco. She reports that she does not drink alcohol and does not use drugs.   Family History:   family history includes Heart attack in her mother; Heart disease in her brother, brother, and mother; Thyroid  disease in her father.    Review of Systems: Review of Systems  Constitutional:  Positive for malaise/fatigue.  HENT:  Negative.    Respiratory: Negative.    Cardiovascular: Negative.   Gastrointestinal: Negative.   Musculoskeletal: Negative.        Leg weakness  Neurological: Negative.   Psychiatric/Behavioral: Negative.    All other systems reviewed and are negative.  PHYSICAL EXAM: VS:  BP (!) 100/52 (BP Location: Left Arm, Patient Position: Sitting, Cuff Size: Normal)   Pulse 91   Ht 5\' 3"  (1.6 m)   Wt 197 lb 4 oz (89.5 kg)   SpO2 95%   BMI 34.94 kg/m  , BMI Body mass index is 34.94 kg/m. Constitutional:  oriented to person, place, and time. No distress.  HENT:  Head: Grossly normal Eyes:  no discharge. No scleral icterus.  Neck: No JVD, no carotid bruits  Cardiovascular: Regular rate and rhythm, no murmurs appreciated Pulmonary/Chest: Clear to auscultation bilaterally, no wheezes or rales Abdominal: Soft.  no distension.  no tenderness.  Musculoskeletal: Normal range of motion Neurological:  normal muscle tone. Coordination normal. No atrophy Skin: Skin warm and dry Psychiatric: normal affect, pleasant   Recent Labs: 11/05/2022: Magnesium  1.7; TSH 1.389 06/04/2023: ALT 10; BUN 61; Creatinine, Ser 1.96; Hemoglobin 10.9; Platelets 220; Potassium 4.5; Sodium 140    Lipid Panel Wt Readings from Last 3 Encounters:  06/18/23 197 lb 4 oz (89.5 kg)  06/04/23 198 lb (89.8 kg)  04/16/23 198 lb (89.8 kg)     ASSESSMENT AND PLAN:  Acute on chronic diastolic and systolic CHF Weight down 197 pounds, previously over 200 Takes torsemide  40 every day, rarely skips a day Not taking metolazone ,  Trace leg swelling, wounds in place  Cardiomyopathy Last evaluation April 2023 ejection fraction 30 to 35% Previous attempts at goal-directed medical therapy unsuccessful secondary to medication intolerances did not tolerate Farxiga , has not tolerated Entresto , spironolactone, Coreg  Blood pressure low (100 systolic) limiting titration of medications  Mixed  hyperlipidemia Continue Zetia  Statin  intolerance, no changes made  Essential hypertension  Lisinopril   10 daily, torsemide  40 daily She previously stopped metoprolol  for near-syncope Medication intolerances as detailed above Blood pressure low on today's visit  Chronic atrial fibrillation (HCC)  A. fib  contributing to CHF Tolerating eliquis    Obstructive sleep apnea Previously reported this was "not a major issue" Currently not on CPAP  Type 2 diabetes mellitus without complication, with long-term current use of insulin  (HCC) Previously ried Ozempic/Mounjaro, had stomach cramps Recommend she continue low carbohydrate diet   Orders Placed This Encounter  Procedures   EKG 12-Lead     Signed, Juanda Noon, M.D., Ph.D. 06/18/2023  [facility] [place], [place] [phone]

## 2023-06-18 ENCOUNTER — Encounter: Payer: Self-pay | Admitting: Cardiovascular Disease

## 2023-06-18 ENCOUNTER — Ambulatory Visit: Attending: Cardiovascular Disease | Admitting: Cardiovascular Disease

## 2023-06-18 DIAGNOSIS — R55 Syncope and collapse: Secondary | ICD-10-CM | POA: Diagnosis not present

## 2023-06-18 DIAGNOSIS — R0609 Other forms of dyspnea: Secondary | ICD-10-CM

## 2023-06-18 DIAGNOSIS — E782 Mixed hyperlipidemia: Secondary | ICD-10-CM

## 2023-06-18 DIAGNOSIS — I25118 Atherosclerotic heart disease of native coronary artery with other forms of angina pectoris: Secondary | ICD-10-CM | POA: Diagnosis not present

## 2023-06-18 DIAGNOSIS — G4733 Obstructive sleep apnea (adult) (pediatric): Secondary | ICD-10-CM

## 2023-06-18 DIAGNOSIS — I5042 Chronic combined systolic (congestive) and diastolic (congestive) heart failure: Secondary | ICD-10-CM

## 2023-06-18 DIAGNOSIS — I482 Chronic atrial fibrillation, unspecified: Secondary | ICD-10-CM

## 2023-06-18 DIAGNOSIS — I340 Nonrheumatic mitral (valve) insufficiency: Secondary | ICD-10-CM

## 2023-06-18 DIAGNOSIS — R0789 Other chest pain: Secondary | ICD-10-CM

## 2023-06-18 DIAGNOSIS — I739 Peripheral vascular disease, unspecified: Secondary | ICD-10-CM

## 2023-06-18 DIAGNOSIS — Z789 Other specified health status: Secondary | ICD-10-CM

## 2023-06-18 NOTE — Patient Instructions (Addendum)
 Medication Instructions:  No changes  Consider over the counter  Vit D and B12  If you need a refill on your cardiac medications before your next appointment, please call your pharmacy.   Lab work: No new labs needed  Testing/Procedures: No new testing needed  Follow-Up: At [facility], you and your health needs are our priority.  As part of our continuing mission to provide you with exceptional heart care, we have created designated Provider Care Teams.  These Care Teams include your primary Cardiologist (physician) and Advanced Practice Providers (APPs -  Physician Assistants and Nurse Practitioners) who all work together to provide you with the care you need, when you need it.  You will need a follow up appointment in 12 months  Providers on your designated Care Team:   Laneta Pintos, NP Varney Gentleman, PA-C Cadence Gennaro Khat, [place]  COVID-19 Vaccine Information can be found at: [email or web address] For questions related to vaccine distribution or appointments, please email [email or web address] or call [phone].

## 2023-07-10 ENCOUNTER — Telehealth: Payer: Self-pay | Admitting: Cardiovascular Disease

## 2023-07-10 MED ORDER — APIXABAN 2.5 MG PO TABS: 2.5000 mg | ORAL_TABLET | Freq: Two times a day (BID) | ORAL | 5 refills | Status: DC

## 2023-07-10 NOTE — Telephone Encounter (Signed)
 Pt c/o medication issue:  1. Name of Medication:   apixaban  (ELIQUIS ) 5 MG TABS tablet    2. How are you currently taking this medication (dosage and times per day)? Has not taken today  3. Are you having a reaction (difficulty breathing--STAT)? No   4. What is your medication issue? Patient is calling stating she only has one tablet left of medication and has not taken it today. A high priority refill request has been sent, but patient is unsure if she should take her last tablet now, or tonight, or tomorrow due to not having anymore until she is able to get the next prescription. She is also wanting to discuss how to take eliquis  with bleeds upon callback. Please advise.

## 2023-07-10 NOTE — Telephone Encounter (Signed)
 Called patient and informed of dose reduction of Eliquis  to 2.5mg  twice daily.  She verbalized understanding.  New RX sent to pharmacy.

## 2023-07-10 NOTE — Telephone Encounter (Signed)
*  STAT* If patient is at the pharmacy, call can be transferred to refill team.   1. Which medications need to be refilled? (please list name of each medication and dose if known)   apixaban  (ELIQUIS ) 5 MG TABS tablet     2. Would you like to learn more about the convenience, safety, & potential cost savings by using the [facility]? N/A     3. Are you open to using the [facility] (Type [facility]. N/A   4. Which pharmacy/location (including street and city if local pharmacy) is medication to be sent to?  [id number] - [place] ([place]), [place] - [address]     5. Do they need a 30 day or 90 day supply? 90 day     Patient only has one tablet left and has not taken any today. Please advise.

## 2023-07-10 NOTE — Telephone Encounter (Signed)
 Prescription refill request for Eliquis  received. Indication: AF Last office visit: 06/18/23  Ciro Cress MD Scr: 1.96 on 06/04/23  Epic Age: 89 Weight: 89.5kg

## 2023-07-18 ENCOUNTER — Ambulatory Visit: Attending: Nurse Practitioner | Admitting: Nurse Practitioner

## 2023-07-18 ENCOUNTER — Encounter: Payer: Self-pay | Admitting: Nurse Practitioner

## 2023-07-18 ENCOUNTER — Encounter: Admitting: Pain Medicine

## 2023-07-18 DIAGNOSIS — Z79899 Other long term (current) drug therapy: Secondary | ICD-10-CM | POA: Diagnosis present

## 2023-07-18 DIAGNOSIS — M25562 Pain in left knee: Secondary | ICD-10-CM | POA: Insufficient documentation

## 2023-07-18 DIAGNOSIS — M549 Dorsalgia, unspecified: Secondary | ICD-10-CM | POA: Insufficient documentation

## 2023-07-18 DIAGNOSIS — M79604 Pain in right leg: Secondary | ICD-10-CM | POA: Insufficient documentation

## 2023-07-18 DIAGNOSIS — M25561 Pain in right knee: Secondary | ICD-10-CM | POA: Insufficient documentation

## 2023-07-18 DIAGNOSIS — M47817 Spondylosis without myelopathy or radiculopathy, lumbosacral region: Secondary | ICD-10-CM | POA: Diagnosis present

## 2023-07-18 DIAGNOSIS — Z79891 Long term (current) use of opiate analgesic: Secondary | ICD-10-CM | POA: Insufficient documentation

## 2023-07-18 DIAGNOSIS — G8929 Other chronic pain: Secondary | ICD-10-CM | POA: Insufficient documentation

## 2023-07-18 DIAGNOSIS — M25551 Pain in right hip: Secondary | ICD-10-CM | POA: Diagnosis present

## 2023-07-18 DIAGNOSIS — M25552 Pain in left hip: Secondary | ICD-10-CM | POA: Diagnosis present

## 2023-07-18 DIAGNOSIS — G894 Chronic pain syndrome: Secondary | ICD-10-CM | POA: Insufficient documentation

## 2023-07-18 DIAGNOSIS — M5442 Lumbago with sciatica, left side: Secondary | ICD-10-CM | POA: Diagnosis present

## 2023-07-18 DIAGNOSIS — M5441 Lumbago with sciatica, right side: Secondary | ICD-10-CM | POA: Insufficient documentation

## 2023-07-18 DIAGNOSIS — M79605 Pain in left leg: Secondary | ICD-10-CM | POA: Insufficient documentation

## 2023-07-18 MED ORDER — HYDROCODONE-ACETAMINOPHEN 10-325 MG PO TABS: 1.0000 | ORAL_TABLET | Freq: Three times a day (TID) | ORAL | 0 refills | Status: DC | PRN

## 2023-07-18 NOTE — Progress Notes (Signed)
 PROVIDER NOTE: Interpretation of information contained herein should be left to medically-trained personnel. Specific patient instructions are provided elsewhere under Patient Instructions section of medical record. This document was created in part using AI and STT-dictation technology, any transcriptional errors that may result from this process are unintentional.  Patient: Tracy Mann  Service: E/M   PCP: Nikki Barters, MD  DOB: 07/29/1933  DOS: 07/18/2023  Provider: Cherylin Corrigan, NP  MRN: [id number]  Delivery: Face-to-face  Specialty: Interventional Pain Management  Type: Established Patient  Setting: Ambulatory outpatient facility  Specialty designation: 09  Referring Prov.: Nikki Barters, MD  Location: Outpatient office facility       History of present illness (HPI) Tracy Mann, a 89 y.o. year old female, is here today because of her No primary diagnosis found.. [name]'s primary complain today is Leg Pain (Bilateral; has cellulitis in both legs )  Pertinent problems: [name] has Cervical muscle strain; DDD (degenerative disc disease), Lumbosacral; chronic venous insufficiency; Lumbar facet hypertrophy (Multilevel) (Bilateral); Lumbar central spinal stenosis (Severe) (L3-L4) with neurogenic claudication; Lumbosacral radiculopathy at L5 (Bilateral); Osteoarthritis of knee (Right); cellulitis of bilateral legs and Chronic pain syndrome on their pertinent problem list.  Pain Assessment: Severity of Chronic pain is reported as a 7 /10. Location: Leg Right, Left/lower legs. Onset: More than a month ago. Quality: Burning. Timing: Constant. Modifying factor(s): meds. Vitals:  height is 5' 5 (1.651 m) and weight is 193 lb (87.5 kg). Her temperature is 97.1 F (36.2 C) (abnormal). Her blood pressure is 101/63 and her pulse is 97. Her oxygen saturation is 94%.  BMI: Estimated body mass index is 32.12 kg/m as calculated from the following:   Height as of this encounter: 5' 5  (1.651 m).   Weight as of this encounter: 193 lb (87.5 kg).  Last encounter: 04/16/2023 Last procedure: Visit date not found.  Reason for encounter: medication management.  The patient indicates doing well with current medication regimen.  No adverse reaction or side effects reported to medication.   The patient reports increased pain in her bilateral lower extremities due to cellulitis and arthritis.  She experience more intense pain, particularly in the middle of the day.  As a result, she has occasionally taken Norco 3 times a day instead of prescribed 2 times a day to help manage her symptoms.  Pharmacotherapy Assessment   Analgesic: Hydrocodone -acetaminophen  (Norco) 10-325 mg tablet every 8 hours as needed for pain. MME=20 Monitoring: [place] PMP: PDMP reviewed during this encounter.       Pharmacotherapy: No side-effects or adverse reactions reported. Compliance: No problems identified. Effectiveness: Clinically acceptable.  Merilyn Staple, RN  07/18/2023  1:09 PM  Sign when Signing Visit Safety precautions to be maintained throughout the outpatient stay will include: orient to surroundings, keep bed in low position, maintain call bell within reach at all times, provide assistance with transfer out of bed and ambulation.   Nursing Pain Medication Assessment:  Safety precautions to be maintained throughout the outpatient stay will include: orient to surroundings, keep bed in low position, maintain call bell within reach at all times, provide assistance with transfer out of bed and ambulation.  Medication Inspection Compliance: Pill count conducted under aseptic conditions, in front of the patient. Neither the pills nor the bottle was removed from the patient's sight at any time. Once count was completed pills were immediately returned to the patient in their original bottle.  Medication: Hydrocodone /APAP Pill/Patch Count: 17 of  60 pills/patches remain Pill/Patch Appearance: Markings  consistent with prescribed medication Bottle Appearance: Standard pharmacy container. Clearly labeled. Filled Date: 5 / 21 / 2025 Last Medication intake:  Today  UDS:  Summary  Date Value Ref Range Status  07/19/2022 Note  Final    Comment:    ==================================================================== [employer] Select 13 (MW) ==================================================================== Test                             Result       Flag       Units  Drug Present and Declared for Prescription Verification   Lorazepam                       713          EXPECTED   ng/mg creat    Source of lorazepam  is a scheduled prescription medication.    Hydrocodone                     1207         EXPECTED   ng/mg creat   Hydromorphone                  83           EXPECTED   ng/mg creat   Dihydrocodeine                 293          EXPECTED   ng/mg creat   Norhydrocodone                 902          EXPECTED   ng/mg creat    Sources of hydrocodone  include scheduled prescription medications.    Hydromorphone, dihydrocodeine and norhydrocodone are expected    metabolites of hydrocodone . Hydromorphone and dihydrocodeine are    also available as scheduled prescription medications.  ==================================================================== Test                      Result    Flag   Units      Ref Range   Creatinine              89               mg/dL      >=16 ==================================================================== Declared Medications:  The flagging and interpretation on this report are based on the  following declared medications.  Unexpected results may arise from  inaccuracies in the declared medications.   **Note: The testing scope of this panel includes these medications:   Hydrocodone  (Norco)  Lorazepam  (Ativan )   **Note: The testing scope of this panel does not include the  following reported medications:   Acetaminophen  (Norco)  Albuterol   (Ventolin  HFA)  Apixaban  (Eliquis )  Doxycycline   Ezetimibe  (Zetia )  Flavoxate (Urispas)  Fluticasone  (Flonase )  Insulin  (Lantus )  Levothyroxine  (Synthroid )  Lisinopril  (Zestril )  Magnesium   Metolazone  (Zaroxolyn )  Naloxone  (Narcan )  Nitrofurantoin  (Macrodantin )  Nitroglycerin  (Nitrostat )  Ondansetron  (Zofran )  Pantoprazole  (Protonix )  Phenazopyridine  (Pyridium )  Polyethylene Glycol (GlycoLax )  Ropinirole  (Requip )  Torsemide  (Demadex ) ==================================================================== For clinical consultation, please call [phone]. ====================================================================     No results found for: CBDTHCR No results found for: D8THCCBX No results found for: D9THCCBX  ROS  Constitutional: Denies any fever or chills Gastrointestinal: No reported hemesis, hematochezia, vomiting, or acute GI distress Musculoskeletal: Bilateral leg pain (cellulitis)  Neurological: No reported episodes of acute onset apraxia, aphasia, dysarthria, agnosia, amnesia, paralysis, loss of coordination, or loss of consciousness  Medication Review  HYDROcodone -acetaminophen , Insulin  Pen Needle, LORazepam , Magnesium , albuterol , apixaban , estradiol , ezetimibe , flavoxATE, fluticasone , hydrocortisone  cream, insulin  glargine, insulin  lispro, levothyroxine , lisinopril , mupirocin  cream, naloxone , nitroGLYCERIN , ondansetron , pantoprazole , sucralfate , and torsemide   History Review  Allergy: Ms. Sanko is allergic to clindamycin , clindamycin /lincomycin, duloxetine , paroxetine , sulfa antibiotics, cephalexin , clarithromycin, diphenhydramine, estrogens conjugated, estrogens conjugated, and nitrofurantoin . Drug: Ms. Koska  reports no history of drug use. Alcohol:  reports no history of alcohol use. Tobacco:  reports that she has never smoked. She has never used smokeless tobacco. Social: Ms. Cumbo  reports that she has never smoked. She has never used smokeless  tobacco. She reports that she does not drink alcohol and does not use drugs. Medical:  has a past medical history of Acute myocardial infarction of inferior wall (HCC), Arthritis, Chronic anticoagulation, Chronic atrial fibrillation (HCC), Chronic combined systolic and diastolic CHF (congestive heart failure) (HCC), Essential hypertension, Hyperlipidemia, mixed, Mixed Ischemic/Nonischemic Cardiomyopathy, Nonobstructive coronary artery disease, Obesity, Thyroid  disease, Type II diabetes mellitus (HCC), Venous insufficiency, and Vitamin D deficiency. Surgical: Ms. Schlotzhauer  has a past surgical history that includes Vesicovaginal fistula closure w/ TAH (1996); Tubal ligation (1968); Cholecystectomy (1999); and Cardiac catheterization (11/2012). Family: family history includes Heart attack in her mother; Heart disease in her brother, brother, and mother; Thyroid  disease in her father.  Laboratory Chemistry Profile   Renal Lab Results  Component Value Date   BUN 61 (H) 06/04/2023   CREATININE 1.96 (H) 06/04/2023   BCR 23 03/15/2022   GFRAA 73 12/31/2019   GFRNONAA 24 (L) 06/04/2023    Hepatic Lab Results  Component Value Date   AST 19 06/04/2023   ALT 10 06/04/2023   ALBUMIN 3.9 06/04/2023   ALKPHOS 69 06/04/2023    Electrolytes Lab Results  Component Value Date   NA 140 06/04/2023   K 4.5 06/04/2023   CL 109 06/04/2023   CALCIUM  8.8 (L) 06/04/2023   MG 1.7 11/05/2022   PHOS 3.7 11/05/2022    Bone Lab Results  Component Value Date   25OHVITD1 43 11/26/2019   25OHVITD2 9.2 11/26/2019   25OHVITD3 34 11/26/2019    Inflammation (CRP: Acute Phase) (ESR: Chronic Phase) Lab Results  Component Value Date   CRP 4 11/26/2019   ESRSEDRATE 45 (H) 11/26/2019   LATICACIDVEN 1.3 06/04/2023         Note: Above Lab results reviewed.  Recent Imaging Review  US  RENAL CLINICAL DATA:  Cystitis  EXAM: RENAL / URINARY TRACT ULTRASOUND COMPLETE  COMPARISON:  Renal ultrasound  11/06/2022  FINDINGS: Right Kidney:  Renal measurements: 9.5 x 3.2 x 4.7 cm = volume: 74.6 mL. Echogenicity within normal limits. No mass or hydronephrosis visualized.  Left Kidney:  Renal measurements: 10.6 x 4.1 x 4.7 cm = volume: 105.6 mL. Normal renal cortical thickness and echogenicity. No hydronephrosis. There is a 9 x 7 x 7 mm hypoechoic mass midpole left kidney.  Bladder:  Appears normal for degree of bladder distention.  Other:  None.  IMPRESSION: 1. No hydronephrosis. 2. There is a 9 mm hypoechoic mass midpole left kidney. This may represent a complicated cyst. Recommend follow-up renal ultrasound in 6 months to assess for stability.  Electronically Signed   By: Jone Neither M.D.   On: 12/26/2022 15:29 Note: Reviewed        Physical Exam  General appearance: Well nourished, well developed, and well hydrated.  In no apparent acute distress Mental status: Alert, oriented x 3 (person, place, & time)       Respiratory: No evidence of acute respiratory distress Eyes: PERLA Vitals: BP 101/63   Pulse 97   Temp (!) 97.1 F (36.2 C)   Ht 5' 5 (1.651 m)   Wt 193 lb (87.5 kg)   SpO2 94%   BMI 32.12 kg/m  BMI: Estimated body mass index is 32.12 kg/m as calculated from the following:   Height as of this encounter: 5' 5 (1.651 m).   Weight as of this encounter: 193 lb (87.5 kg). Ideal: Ideal body weight: 57 kg (125 lb 10.6 oz) Adjusted ideal body weight: 69.2 kg (152 lb 9.6 oz)  Assessment   Diagnosis Status  1. Chronic lower extremity pain (1ry area of Pain) (Bilateral)   2. Chronic hip pain (2ry area of Pain) (Bilateral) (R>L)   3. Chronic low back pain (3ry area of Pain) (Bilateral) w/ sciatica (Bilateral)   4. Lumbosacral facet syndrome   5. Chronic upper back pain   6. Chronic knee pain (Bilateral)   7. Chronic pain syndrome   8. Pharmacologic therapy   9. Chronic use of opiate for therapeutic purpose   10. Encounter for medication management   11.  Encounter for chronic pain management    Controlled Controlled Controlled   Updated Problems: No problems updated.  Plan of Care  Problem-specific:  Assessment and Plan Updated medication: Hydrocodone -acetaminophen  10-325 mg tablet every 8 hours as needed for pain. With the #90 up to 30 days.  We will continue on current medication regimen.  Prescribing drug monitoring (PDMP) reviewed; findings consistent with the use of prescribed medication and no evidence of narcotic misuse or abuse. Routine UDS ordered today.  Schedule follow-up in 90 days for medication management with Marthe Slain, NP.   Ms. MALKA BOCEK has a current medication list which includes the following long-term medication(s): albuterol , apixaban , ezetimibe , fluticasone , insulin  lispro, levothyroxine , lisinopril , nitroglycerin , pantoprazole , sucralfate , torsemide , [START ON 07/20/2023] hydrocodone -acetaminophen , [START ON 08/19/2023] hydrocodone -acetaminophen , and [START ON 09/18/2023] hydrocodone -acetaminophen .  Pharmacotherapy (Medications Ordered): Meds ordered this encounter  Medications   HYDROcodone -acetaminophen  (NORCO) 10-325 MG tablet    Sig: Take 1 tablet by mouth every 8 (eight) hours as needed for severe pain (pain score 7-10). Must last 30 days.    Dispense:  90 tablet    Refill:  0    DO NOT: delete (not duplicate); no partial-fill (will deny script to complete), no refill request (F/U required). DISPENSE: 1 day early if closed on fill date. WARN: No CNS-depressants within 8 hrs of med.   HYDROcodone -acetaminophen  (NORCO) 10-325 MG tablet    Sig: Take 1 tablet by mouth every 8 (eight) hours as needed for severe pain (pain score 7-10). Must last 30 days.    Dispense:  90 tablet    Refill:  0    DO NOT: delete (not duplicate); no partial-fill (will deny script to complete), no refill request (F/U required). DISPENSE: 1 day early if closed on fill date. WARN: No CNS-depressants within 8 hrs of med.    HYDROcodone -acetaminophen  (NORCO) 10-325 MG tablet    Sig: Take 1 tablet by mouth every 8 (eight) hours as needed for severe pain (pain score 7-10). Must last 30 days.    Dispense:  90 tablet    Refill:  0    DO NOT: delete (not duplicate); no partial-fill (will deny script to complete), no refill request (F/U required). DISPENSE: 1 day  early if closed on fill date. WARN: No CNS-depressants within 8 hrs of med.   Orders:  Orders Placed This Encounter  Procedures   Tox[employer]SU[employer] Select 13 (MW), Urine    Volume: 30 ml(s). Minimum 3 ml of urine is needed. Document temperature of fresh sample. Indications: Long term (current) use of opiate analgesic (Z61.096)    Release to patient:   Immediate        Return in about 3 months (around 10/18/2023) for (F2F), (MM), Marthe Slain NP.    Recent Visits No visits were found meeting these conditions. Showing recent visits within past 90 days and meeting all other requirements Today's Visits Date Type Provider Dept  07/18/23 Office Visit Nusaiba Guallpa K, NP [facility]  Showing today's visits and meeting all other requirements Future Appointments Date Type Provider Dept  10/11/23 Appointment Gurbani Figge K, NP [facility]  Showing future appointments within next 90 days and meeting all other requirements  I discussed the assessment and treatment plan with the patient. The patient was provided an opportunity to ask questions and all were answered. The patient agreed with the plan and demonstrated an understanding of the instructions.  Patient advised to call back or seek an in-person evaluation if the symptoms or condition worsens.  Duration of encounter: 30 minutes.  Total time on encounter, as per AMA guidelines included both the face-to-face and non-face-to-face time personally spent by the physician and/or other qualified health care professional(s) on the day of the encounter (includes time in activities that require the  physician or other qualified health care professional and does not include time in activities normally performed by clinical staff). Physician's time may include the following activities when performed: Preparing to see the patient (e.g., pre-charting review of records, searching for previously ordered imaging, lab work, and nerve conduction tests) Review of prior analgesic pharmacotherapies. Reviewing PMP Interpreting ordered tests (e.g., lab work, imaging, nerve conduction tests) Performing post-procedure evaluations, including interpretation of diagnostic procedures Obtaining and/or reviewing separately obtained history Performing a medically appropriate examination and/or evaluation Counseling and educating the patient/family/caregiver Ordering medications, tests, or procedures Referring and communicating with other health care professionals (when not separately reported) Documenting clinical information in the electronic or other health record Independently interpreting results (not separately reported) and communicating results to the patient/ family/caregiver Care coordination (not separately reported)  Note by: Arville Postlewaite K Tangy Drozdowski, NP (TTS and AI technology used. I apologize for any typographical errors that were not detected and corrected.) Date: 07/18/2023; Time: 2:24 PM

## 2023-07-18 NOTE — Progress Notes (Signed)
 Safety precautions to be maintained throughout the outpatient stay will include: orient to surroundings, keep bed in low position, maintain call bell within reach at all times, provide assistance with transfer out of bed and ambulation.   Nursing Pain Medication Assessment:  Safety precautions to be maintained throughout the outpatient stay will include: orient to surroundings, keep bed in low position, maintain call bell within reach at all times, provide assistance with transfer out of bed and ambulation.  Medication Inspection Compliance: Pill count conducted under aseptic conditions, in front of the patient. Neither the pills nor the bottle was removed from the patient's sight at any time. Once count was completed pills were immediately returned to the patient in their original bottle.  Medication: Hydrocodone /APAP Pill/Patch Count: 17 of 60 pills/patches remain Pill/Patch Appearance: Markings consistent with prescribed medication Bottle Appearance: Standard pharmacy container. Clearly labeled. Filled Date: 5 / 21 / 2025 Last Medication intake:  Today

## 2023-07-20 ENCOUNTER — Encounter (HOSPITAL_BASED_OUTPATIENT_CLINIC_OR_DEPARTMENT_OTHER): Payer: Self-pay

## 2023-07-20 ENCOUNTER — Emergency Department (HOSPITAL_BASED_OUTPATIENT_CLINIC_OR_DEPARTMENT_OTHER)

## 2023-07-20 ENCOUNTER — Other Ambulatory Visit: Payer: Self-pay

## 2023-07-20 ENCOUNTER — Inpatient Hospital Stay (HOSPITAL_BASED_OUTPATIENT_CLINIC_OR_DEPARTMENT_OTHER)
Admission: EM | Admit: 2023-07-20 | Discharge: 2023-07-22 | DRG: 603 | Disposition: A | Discharge status: Home / Self Care | Source: Ambulatory Visit | Attending: Internal Medicine | Admitting: Internal Medicine

## 2023-07-20 DIAGNOSIS — E875 Hyperkalemia: Secondary | ICD-10-CM | POA: Diagnosis present

## 2023-07-20 DIAGNOSIS — Z79890 Hormone replacement therapy (postmenopausal): Secondary | ICD-10-CM

## 2023-07-20 DIAGNOSIS — Z794 Long term (current) use of insulin: Secondary | ICD-10-CM | POA: Diagnosis not present

## 2023-07-20 DIAGNOSIS — L03115 Cellulitis of right lower limb: Secondary | ICD-10-CM | POA: Diagnosis present

## 2023-07-20 DIAGNOSIS — Z79891 Long term (current) use of opiate analgesic: Secondary | ICD-10-CM

## 2023-07-20 DIAGNOSIS — I2510 Atherosclerotic heart disease of native coronary artery without angina pectoris: Secondary | ICD-10-CM | POA: Diagnosis present

## 2023-07-20 DIAGNOSIS — E039 Hypothyroidism, unspecified: Secondary | ICD-10-CM | POA: Diagnosis present

## 2023-07-20 DIAGNOSIS — Z8679 Personal history of other diseases of the circulatory system: Secondary | ICD-10-CM | POA: Diagnosis not present

## 2023-07-20 DIAGNOSIS — I5040 Unspecified combined systolic (congestive) and diastolic (congestive) heart failure: Secondary | ICD-10-CM | POA: Insufficient documentation

## 2023-07-20 DIAGNOSIS — I4891 Unspecified atrial fibrillation: Secondary | ICD-10-CM | POA: Diagnosis present

## 2023-07-20 DIAGNOSIS — Z9049 Acquired absence of other specified parts of digestive tract: Secondary | ICD-10-CM

## 2023-07-20 DIAGNOSIS — N179 Acute kidney failure, unspecified: Secondary | ICD-10-CM | POA: Diagnosis present

## 2023-07-20 DIAGNOSIS — Z8249 Family history of ischemic heart disease and other diseases of the circulatory system: Secondary | ICD-10-CM

## 2023-07-20 DIAGNOSIS — I5042 Chronic combined systolic (congestive) and diastolic (congestive) heart failure: Secondary | ICD-10-CM | POA: Diagnosis present

## 2023-07-20 DIAGNOSIS — M7989 Other specified soft tissue disorders: Secondary | ICD-10-CM | POA: Diagnosis present

## 2023-07-20 DIAGNOSIS — E669 Obesity, unspecified: Secondary | ICD-10-CM | POA: Diagnosis present

## 2023-07-20 DIAGNOSIS — L03116 Cellulitis of left lower limb: Secondary | ICD-10-CM | POA: Diagnosis present

## 2023-07-20 DIAGNOSIS — I272 Pulmonary hypertension, unspecified: Secondary | ICD-10-CM | POA: Diagnosis present

## 2023-07-20 DIAGNOSIS — I83018 Varicose veins of right lower extremity with ulcer other part of lower leg: Secondary | ICD-10-CM | POA: Diagnosis present

## 2023-07-20 DIAGNOSIS — N1832 Chronic kidney disease, stage 3b: Secondary | ICD-10-CM | POA: Diagnosis present

## 2023-07-20 DIAGNOSIS — I872 Venous insufficiency (chronic) (peripheral): Secondary | ICD-10-CM | POA: Diagnosis not present

## 2023-07-20 DIAGNOSIS — E7849 Other hyperlipidemia: Secondary | ICD-10-CM | POA: Diagnosis not present

## 2023-07-20 DIAGNOSIS — I255 Ischemic cardiomyopathy: Secondary | ICD-10-CM | POA: Diagnosis present

## 2023-07-20 DIAGNOSIS — Z8349 Family history of other endocrine, nutritional and metabolic diseases: Secondary | ICD-10-CM

## 2023-07-20 DIAGNOSIS — E119 Type 2 diabetes mellitus without complications: Secondary | ICD-10-CM

## 2023-07-20 DIAGNOSIS — Z881 Allergy status to other antibiotic agents status: Secondary | ICD-10-CM

## 2023-07-20 DIAGNOSIS — G894 Chronic pain syndrome: Secondary | ICD-10-CM | POA: Diagnosis present

## 2023-07-20 DIAGNOSIS — F32A Depression, unspecified: Secondary | ICD-10-CM | POA: Diagnosis present

## 2023-07-20 DIAGNOSIS — Z6834 Body mass index (BMI) 34.0-34.9, adult: Secondary | ICD-10-CM

## 2023-07-20 DIAGNOSIS — I83028 Varicose veins of left lower extremity with ulcer other part of lower leg: Secondary | ICD-10-CM | POA: Diagnosis present

## 2023-07-20 DIAGNOSIS — J45909 Unspecified asthma, uncomplicated: Secondary | ICD-10-CM | POA: Diagnosis present

## 2023-07-20 DIAGNOSIS — E782 Mixed hyperlipidemia: Secondary | ICD-10-CM | POA: Diagnosis present

## 2023-07-20 DIAGNOSIS — E785 Hyperlipidemia, unspecified: Secondary | ICD-10-CM | POA: Diagnosis present

## 2023-07-20 DIAGNOSIS — E1122 Type 2 diabetes mellitus with diabetic chronic kidney disease: Secondary | ICD-10-CM | POA: Diagnosis present

## 2023-07-20 DIAGNOSIS — G4733 Obstructive sleep apnea (adult) (pediatric): Secondary | ICD-10-CM | POA: Diagnosis present

## 2023-07-20 DIAGNOSIS — I959 Hypotension, unspecified: Secondary | ICD-10-CM | POA: Diagnosis present

## 2023-07-20 DIAGNOSIS — I252 Old myocardial infarction: Secondary | ICD-10-CM

## 2023-07-20 DIAGNOSIS — N189 Chronic kidney disease, unspecified: Secondary | ICD-10-CM | POA: Diagnosis not present

## 2023-07-20 DIAGNOSIS — L03119 Cellulitis of unspecified part of limb: Principal | ICD-10-CM | POA: Diagnosis present

## 2023-07-20 DIAGNOSIS — Z880 Allergy status to penicillin: Secondary | ICD-10-CM

## 2023-07-20 DIAGNOSIS — I10 Essential (primary) hypertension: Secondary | ICD-10-CM | POA: Diagnosis not present

## 2023-07-20 DIAGNOSIS — Z90710 Acquired absence of both cervix and uterus: Secondary | ICD-10-CM

## 2023-07-20 DIAGNOSIS — F411 Generalized anxiety disorder: Secondary | ICD-10-CM | POA: Diagnosis present

## 2023-07-20 DIAGNOSIS — E038 Other specified hypothyroidism: Secondary | ICD-10-CM | POA: Diagnosis not present

## 2023-07-20 DIAGNOSIS — I130 Hypertensive heart and chronic kidney disease with heart failure and stage 1 through stage 4 chronic kidney disease, or unspecified chronic kidney disease: Secondary | ICD-10-CM | POA: Diagnosis present

## 2023-07-20 DIAGNOSIS — I878 Other specified disorders of veins: Secondary | ICD-10-CM | POA: Diagnosis present

## 2023-07-20 DIAGNOSIS — I428 Other cardiomyopathies: Secondary | ICD-10-CM | POA: Diagnosis present

## 2023-07-20 DIAGNOSIS — I482 Chronic atrial fibrillation, unspecified: Secondary | ICD-10-CM | POA: Diagnosis present

## 2023-07-20 DIAGNOSIS — Z888 Allergy status to other drugs, medicaments and biological substances status: Secondary | ICD-10-CM

## 2023-07-20 DIAGNOSIS — Z7901 Long term (current) use of anticoagulants: Secondary | ICD-10-CM

## 2023-07-20 DIAGNOSIS — Z79899 Other long term (current) drug therapy: Secondary | ICD-10-CM

## 2023-07-20 LAB — URINALYSIS, W/ REFLEX TO CULTURE (INFECTION SUSPECTED)
Bacteria, UA: NONE SEEN
Bilirubin Urine: NEGATIVE
Glucose, UA: NEGATIVE mg/dL
Hgb urine dipstick: NEGATIVE
Ketones, ur: NEGATIVE mg/dL
Nitrite: NEGATIVE
Protein, ur: NEGATIVE mg/dL
Specific Gravity, Urine: 1.015 (ref 1.005–1.030)
pH: 7 (ref 5.0–8.0)

## 2023-07-20 LAB — CBC WITH DIFFERENTIAL/PLATELET
Abs Immature Granulocytes: 0.02 10*3/uL (ref 0.00–0.07)
Basophils Absolute: 0.1 10*3/uL (ref 0.0–0.1)
Basophils Relative: 1 %
Eosinophils Absolute: 0.2 10*3/uL (ref 0.0–0.5)
Eosinophils Relative: 3 %
HCT: 32.7 % — ABNORMAL LOW (ref 36.0–46.0)
Hemoglobin: 10.6 g/dL — ABNORMAL LOW (ref 12.0–15.0)
Immature Granulocytes: 0 %
Lymphocytes Relative: 17 %
Lymphs Abs: 1.6 10*3/uL (ref 0.7–4.0)
MCH: 30.3 pg (ref 26.0–34.0)
MCHC: 32.4 g/dL (ref 30.0–36.0)
MCV: 93.4 fL (ref 80.0–100.0)
Monocytes Absolute: 0.7 10*3/uL (ref 0.1–1.0)
Monocytes Relative: 7 %
Neutro Abs: 6.9 10*3/uL (ref 1.7–7.7)
Neutrophils Relative %: 72 %
Platelets: 226 10*3/uL (ref 150–400)
RBC: 3.5 MIL/uL — ABNORMAL LOW (ref 3.87–5.11)
RDW: 13.3 % (ref 11.5–15.5)
WBC: 9.5 10*3/uL (ref 4.0–10.5)
nRBC: 0 % (ref 0.0–0.2)

## 2023-07-20 LAB — COMPREHENSIVE METABOLIC PANEL WITH GFR
ALT: 9 U/L (ref 0–44)
AST: 24 U/L (ref 15–41)
Albumin: 3.8 g/dL (ref 3.5–5.0)
Alkaline Phosphatase: 80 U/L (ref 38–126)
Anion gap: 11 (ref 5–15)
BUN: 64 mg/dL — ABNORMAL HIGH (ref 8–23)
CO2: 25 mmol/L (ref 22–32)
Calcium: 9.7 mg/dL (ref 8.9–10.3)
Chloride: 104 mmol/L (ref 98–111)
Creatinine, Ser: 2.69 mg/dL — ABNORMAL HIGH (ref 0.44–1.00)
GFR, Estimated: 16 mL/min — ABNORMAL LOW (ref >60)
Glucose, Bld: 126 mg/dL — ABNORMAL HIGH (ref 70–99)
Potassium: 5.5 mmol/L — ABNORMAL HIGH (ref 3.5–5.1)
Sodium: 140 mmol/L (ref 135–145)
Total Bilirubin: 0.4 mg/dL (ref 0.0–1.2)
Total Protein: 7.2 g/dL (ref 6.5–8.1)

## 2023-07-20 LAB — LACTIC ACID, PLASMA: Lactic Acid, Venous: 1.2 mmol/L (ref 0.5–1.9)

## 2023-07-20 LAB — PRO BRAIN NATRIURETIC PEPTIDE: Pro Brain Natriuretic Peptide: 1726 pg/mL — ABNORMAL HIGH (ref <300.0)

## 2023-07-20 LAB — GLUCOSE, CAPILLARY: Glucose-Capillary: 103 mg/dL — ABNORMAL HIGH (ref 70–99)

## 2023-07-20 LAB — LIPASE, BLOOD: Lipase: 32 U/L (ref 11–51)

## 2023-07-20 MED ORDER — VANCOMYCIN VARIABLE DOSE PER UNSTABLE RENAL FUNCTION (PHARMACIST DOSING): Status: DC

## 2023-07-20 MED ORDER — HYDROCODONE-ACETAMINOPHEN 10-325 MG PO TABS
1.0000 | ORAL_TABLET | Freq: Three times a day (TID) | ORAL | Status: DC | PRN
  Administered 2023-07-20 – 2023-07-22 (×4): 1 via ORAL
  Filled 2023-07-20 (×4): qty 1

## 2023-07-20 MED ORDER — HYDROCODONE-ACETAMINOPHEN 5-325 MG PO TABS
1.0000 | ORAL_TABLET | Freq: Once | ORAL | Status: AC
  Administered 2023-07-20: 1 via ORAL
  Filled 2023-07-20: qty 1

## 2023-07-20 MED ORDER — SODIUM CHLORIDE 0.9 % IV SOLN
3.0000 g | Freq: Two times a day (BID) | INTRAVENOUS | Status: DC
  Administered 2023-07-21: 3 g via INTRAVENOUS
  Filled 2023-07-20: qty 8

## 2023-07-20 MED ORDER — SODIUM CHLORIDE 0.9 % IV SOLN: 250.0000 mL | INTRAVENOUS | Status: AC | PRN

## 2023-07-20 MED ORDER — MORPHINE SULFATE (PF) 4 MG/ML IV SOLN
4.0000 mg | Freq: Once | INTRAVENOUS | Status: AC
  Administered 2023-07-20: 4 mg via INTRAVENOUS
  Filled 2023-07-20: qty 1

## 2023-07-20 MED ORDER — INSULIN ASPART 100 UNIT/ML IJ SOLN: 0.0000 [IU] | Freq: Three times a day (TID) | INTRAMUSCULAR | Status: DC

## 2023-07-20 MED ORDER — APIXABAN 2.5 MG PO TABS
2.5000 mg | ORAL_TABLET | Freq: Two times a day (BID) | ORAL | Status: DC
  Administered 2023-07-20 – 2023-07-22 (×4): 2.5 mg via ORAL
  Filled 2023-07-20 (×4): qty 1

## 2023-07-20 MED ORDER — INSULIN ASPART 100 UNIT/ML IJ SOLN: 0.0000 [IU] | Freq: Every day | INTRAMUSCULAR | Status: DC

## 2023-07-20 MED ORDER — ONDANSETRON HCL 4 MG/2ML IJ SOLN: 4.0000 mg | Freq: Four times a day (QID) | INTRAMUSCULAR | Status: DC | PRN

## 2023-07-20 MED ORDER — SODIUM CHLORIDE 0.9 % IV SOLN
3.0000 g | Freq: Once | INTRAVENOUS | Status: AC
  Administered 2023-07-20: 3 g via INTRAVENOUS

## 2023-07-20 MED ORDER — ONDANSETRON HCL 4 MG PO TABS: 4.0000 mg | ORAL_TABLET | Freq: Four times a day (QID) | ORAL | Status: DC | PRN

## 2023-07-20 MED ORDER — HYDROMORPHONE HCL 1 MG/ML IJ SOLN
0.5000 mg | INTRAMUSCULAR | Status: DC | PRN
  Administered 2023-07-21 (×2): 0.5 mg via INTRAVENOUS
  Filled 2023-07-20 (×2): qty 0.5

## 2023-07-20 MED ORDER — SODIUM CHLORIDE 0.9% FLUSH
3.0000 mL | Freq: Two times a day (BID) | INTRAVENOUS | Status: DC
Start: 2023-07-21 — End: 2023-07-22
  Administered 2023-07-21: 3 mL via INTRAVENOUS

## 2023-07-20 MED ORDER — SODIUM ZIRCONIUM CYCLOSILICATE 10 G PO PACK
10.0000 g | PACK | Freq: Once | ORAL | Status: AC
  Administered 2023-07-20: 10 g via ORAL
  Filled 2023-07-20: qty 1

## 2023-07-20 MED ORDER — LINEZOLID 600 MG/300ML IV SOLN
600.0000 mg | Freq: Once | INTRAVENOUS | Status: AC
  Administered 2023-07-20: 600 mg via INTRAVENOUS
  Filled 2023-07-20: qty 300

## 2023-07-20 MED ORDER — SODIUM CHLORIDE 0.9% FLUSH
3.0000 mL | Freq: Two times a day (BID) | INTRAVENOUS | Status: DC
  Administered 2023-07-21 – 2023-07-22 (×2): 3 mL via INTRAVENOUS

## 2023-07-20 MED ORDER — LEVOTHYROXINE SODIUM 25 MCG PO TABS
25.0000 ug | ORAL_TABLET | Freq: Every day | ORAL | Status: DC
  Administered 2023-07-21 – 2023-07-22 (×2): 25 ug via ORAL
  Filled 2023-07-20 (×2): qty 1

## 2023-07-20 MED ORDER — ACETAMINOPHEN 650 MG RE SUPP: 650.0000 mg | Freq: Four times a day (QID) | RECTAL | Status: DC | PRN

## 2023-07-20 MED ORDER — SODIUM CHLORIDE 0.9% FLUSH: 3.0000 mL | INTRAVENOUS | Status: DC | PRN

## 2023-07-20 MED ORDER — EZETIMIBE 10 MG PO TABS
10.0000 mg | ORAL_TABLET | Freq: Every day | ORAL | Status: DC
  Administered 2023-07-21 – 2023-07-22 (×2): 10 mg via ORAL
  Filled 2023-07-20 (×2): qty 1

## 2023-07-20 MED ORDER — FLUTICASONE PROPIONATE 50 MCG/ACT NA SUSP
2.0000 | Freq: Every day | NASAL | Status: DC
  Filled 2023-07-20: qty 16

## 2023-07-20 MED ORDER — VANCOMYCIN HCL 1750 MG/350ML IV SOLN
1750.0000 mg | Freq: Once | INTRAVENOUS | Status: DC
  Filled 2023-07-20: qty 350

## 2023-07-20 MED ORDER — LACTATED RINGERS IV SOLN: INTRAVENOUS | Status: AC

## 2023-07-20 MED ORDER — FENTANYL CITRATE PF 50 MCG/ML IJ SOSY
50.0000 ug | PREFILLED_SYRINGE | Freq: Once | INTRAMUSCULAR | Status: AC
  Administered 2023-07-20: 50 ug via INTRAVENOUS
  Filled 2023-07-20: qty 1

## 2023-07-20 MED ORDER — VANCOMYCIN HCL 1750 MG/350ML IV SOLN
1750.0000 mg | Freq: Once | INTRAVENOUS | Status: AC
  Administered 2023-07-21: 1750 mg via INTRAVENOUS
  Filled 2023-07-20: qty 350

## 2023-07-20 MED ORDER — ACETAMINOPHEN 325 MG PO TABS: 650.0000 mg | ORAL_TABLET | Freq: Four times a day (QID) | ORAL | Status: DC | PRN

## 2023-07-20 MED ORDER — ALBUTEROL SULFATE (2.5 MG/3ML) 0.083% IN NEBU: 3.0000 mL | INHALATION_SOLUTION | Freq: Four times a day (QID) | RESPIRATORY_TRACT | Status: DC | PRN

## 2023-07-20 MED ORDER — HYDROXYZINE HCL 25 MG PO TABS
25.0000 mg | ORAL_TABLET | Freq: Four times a day (QID) | ORAL | Status: DC | PRN
  Administered 2023-07-20 – 2023-07-21 (×2): 25 mg via ORAL
  Filled 2023-07-20 (×2): qty 1

## 2023-07-20 NOTE — ED Notes (Signed)
 ED Provider at bedside.

## 2023-07-20 NOTE — ED Notes (Signed)
 [facility] at bedside

## 2023-07-20 NOTE — ED Provider Notes (Signed)
 [place] EMERGENCY DEPARTMENT AT [facility] Provider Note   CSN: [id number] Arrival date & time: 07/20/23  1429     Patient presents with: No chief complaint on file.   Tracy Mann is a 89 y.o. female.   The history is provided by the patient and medical records. No language interpreter was used.  Wound Check This is a recurrent problem. The current episode started more than 2 days ago. The problem occurs constantly. The problem has been rapidly worsening. Pertinent negatives include no chest pain, no abdominal pain, no headaches and no shortness of breath. Nothing aggravates the symptoms. Nothing relieves the symptoms. She has tried nothing for the symptoms. The treatment provided no relief.       Prior to Admission medications   Medication Sig Start Date End Date Taking? Authorizing Provider  amoxicillin -clavulanate (AUGMENTIN ) 875-125 MG tablet Take 875 mg by mouth 2 (two) times daily. 07/19/23 07/29/23 Yes [provider]  albuterol  (VENTOLIN  HFA) 108 (90 Base) MCG/ACT inhaler TAKE 2 PUFFS BY MOUTH EVERY 6 HOURS AS NEEDED FOR WHEEZE OR SHORTNESS OF BREATH Patient taking differently: Inhale 2 puffs into the lungs every 6 (six) hours as needed for shortness of breath or wheezing. 03/19/20   Nikki Barters, MD  apixaban  (ELIQUIS ) 2.5 MG TABS tablet Take 1 tablet (2.5 mg total) by mouth 2 (two) times daily. 07/10/23   [name], Timothy J, MD  estradiol  (ESTRACE ) 0.1 MG/GM vaginal cream Place 1 Applicatorful vaginally daily. 12/21/22   [provider]  ezetimibe  (ZETIA ) 10 MG tablet Take 1 tablet (10 mg total) by mouth daily. 07/26/21   Nikki Barters, MD  flavoxATE (URISPAS) 100 MG tablet Take 1 tablet (100 mg total) by mouth 2 (two) times daily as needed for bladder spasms. 10/17/21   Erman Hayward, MD  fluticasone  (FLONASE ) 50 MCG/ACT nasal spray Place 2 sprays into both nostrils daily. 01/06/22   Trenton Frock, PA-C  HYDROcodone -acetaminophen   (NORCO) 10-325 MG tablet Take 1 tablet by mouth every 8 (eight) hours as needed for severe pain (pain score 7-10). Must last 30 days. 07/20/23 08/19/23  Patel, Seema K, NP  HYDROcodone -acetaminophen  (NORCO) 10-325 MG tablet Take 1 tablet by mouth every 8 (eight) hours as needed for severe pain (pain score 7-10). Must last 30 days. 08/19/23 09/18/23  Patel, Seema K, NP  HYDROcodone -acetaminophen  (NORCO) 10-325 MG tablet Take 1 tablet by mouth every 8 (eight) hours as needed for severe pain (pain score 7-10). Must last 30 days. 09/18/23 10/18/23  Patel, Seema K, NP  hydrocortisone  cream 0.5 % Apply 1 Application topically 2 (two) times daily. 06/04/23   Collis Deaner, MD  insulin  glargine (LANTUS  SOLOSTAR) 100 UNIT/ML Solostar Pen Inject 18-24 Units into the skin at bedtime. 05/20/19   [provider]  insulin  lispro (HUMALOG ) 100 UNIT/ML KwikPen Inject 10-25 Units into the skin in the morning, at noon, and at bedtime. 10/12/20   [provider]  Insulin  Pen Needle (RELION PEN NEEDLE 31G/8MM) 31G X 8 MM MISC Use with insulin  pen three times daily 03/24/19   Nikki Barters, MD  levothyroxine  (SYNTHROID ) 25 MCG tablet Take 25 mcg by mouth daily before breakfast.    [provider]  lisinopril  (ZESTRIL ) 10 MG tablet Take 1 tablet (10 mg total) by mouth daily. 12/14/22   [name], Timothy J, MD  LORazepam  (ATIVAN ) 0.5 MG tablet Take 0.5 mg by mouth 2 (two) times daily. 10/19/22   [provider]  Magnesium  500 MG CAPS  Take 1,000 mg by mouth daily.     [provider]  mupirocin  cream (BACTROBAN ) 2 % Apply topically 3 (three) times daily. 11/01/22   Patel, Sona, MD  naloxone  (NARCAN ) nasal spray 4 mg/0.1 mL Place 1 spray into the nose as needed for up to 365 doses (for opioid-induced respiratory depresssion). In case of emergency (overdose), spray once into each nostril. If no response within 3 minutes, repeat application and call 911. 10/18/22 10/18/23  Renaldo Caroli, MD   nitroGLYCERIN  (NITROSTAT ) 0.4 MG SL tablet Place 1 tablet (0.4 mg total) under the tongue every 5 (five) minutes as needed for chest pain. 04/20/23   [name], Timothy J, MD  ondansetron  (ZOFRAN ) 4 MG tablet Take 1 tablet (4 mg total) by mouth every 12 (twelve) hours as needed for nausea or vomiting. 07/26/21   Nikki Barters, MD  pantoprazole  (PROTONIX ) 40 MG tablet Take 1 tablet (40 mg total) by mouth 2 (two) times daily before a meal. 11/07/22 07/18/23  Feliciana Horn, MD  sucralfate  (CARAFATE ) 1 GM/10ML suspension Take 10 mLs (1 g total) by mouth 4 (four) times daily -  with meals and at bedtime for 15 days. 11/07/22 07/18/23  Akula, Vijaya, MD  torsemide  (DEMADEX ) 20 MG tablet Take 2 tablets (40 mg total) by mouth daily. 06/20/22   [name], Timothy J, MD    Allergies: Clindamycin , Clindamycin /lincomycin, Duloxetine , Paroxetine , Sulfa antibiotics, Cephalexin , Clarithromycin, Diphenhydramine, Estrogens conjugated, Estrogens conjugated, and Nitrofurantoin     Review of Systems  Constitutional:  Positive for chills and fatigue. Negative for fever.  HENT:  Negative for congestion.   Eyes:  Negative for visual disturbance.  Respiratory:  Negative for cough, chest tightness and shortness of breath.   Cardiovascular:  Negative for chest pain and palpitations.  Gastrointestinal:  Negative for abdominal pain, constipation, diarrhea, nausea and vomiting.  Genitourinary:  Negative for dysuria and flank pain.  Musculoskeletal:  Negative for back pain, neck pain and neck stiffness.  Skin:  Negative for rash and wound.  Neurological:  Negative for weakness, light-headedness, numbness and headaches.  Psychiatric/Behavioral:  Negative for agitation and confusion.   All other systems reviewed and are negative.   Updated Vital Signs BP (!) 134/55   Pulse 91   Temp 98.4 F (36.9 C)   Resp 16   SpO2 95%   Physical Exam Vitals and nursing note reviewed.  Constitutional:      General: She is not in acute  distress.    Appearance: She is well-developed. She is not ill-appearing, toxic-appearing or diaphoretic.  HENT:     Head: Normocephalic and atraumatic.     Nose: No congestion or rhinorrhea.     Mouth/Throat:     Mouth: Mucous membranes are moist.     Pharynx: No oropharyngeal exudate or posterior oropharyngeal erythema.   Eyes:     Extraocular Movements: Extraocular movements intact.     Conjunctiva/sclera: Conjunctivae normal.     Pupils: Pupils are equal, round, and reactive to light.    Cardiovascular:     Rate and Rhythm: Normal rate and regular rhythm.     Heart sounds: No murmur heard. Pulmonary:     Effort: Pulmonary effort is normal. No respiratory distress.     Breath sounds: Normal breath sounds. No wheezing, rhonchi or rales.  Chest:     Chest wall: No tenderness.  Abdominal:     Palpations: Abdomen is soft.     Tenderness: There is no abdominal tenderness. There is no right CVA tenderness,  left CVA tenderness, guarding or rebound.   Musculoskeletal:        General: Swelling and tenderness present.     Cervical back: Neck supple.     Right lower leg: Edema present.     Left lower leg: Edema present.   Skin:    General: Skin is warm and dry.     Capillary Refill: Capillary refill takes less than 2 seconds.     Findings: Erythema and rash present.   Neurological:     General: No focal deficit present.     Mental Status: She is alert.     Sensory: No sensory deficit.     Motor: No weakness.   Psychiatric:        Mood and Affect: Mood normal.     (all labs ordered are listed, but only abnormal results are displayed) Labs Reviewed  CBC WITH DIFFERENTIAL/PLATELET - Abnormal; Notable for the following components:      Result Value   RBC 3.50 (*)    Hemoglobin 10.6 (*)    HCT 32.7 (*)    All other components within normal limits  COMPREHENSIVE METABOLIC PANEL WITH GFR - Abnormal; Notable for the following components:   Potassium 5.5 (*)    Glucose, Bld  126 (*)    BUN 64 (*)    Creatinine, Ser 2.69 (*)    GFR, Estimated 16 (*)    All other components within normal limits  URINALYSIS, W/ REFLEX TO CULTURE (INFECTION SUSPECTED) - Abnormal; Notable for the following components:   Leukocytes,Ua SMALL (*)    All other components within normal limits  PRO BRAIN NATRIURETIC PEPTIDE - Abnormal; Notable for the following components:   Pro Brain Natriuretic Peptide 1,726.0 (*)    All other components within normal limits  CULTURE, BLOOD (ROUTINE X 2)  CULTURE, BLOOD (ROUTINE X 2)  LACTIC ACID, PLASMA  LIPASE, BLOOD    EKG: None  Radiology: US  Venous Img Lower Bilateral (DVT) Result Date: 07/20/2023 CLINICAL DATA:  Bilateral leg swelling, pain and redness for 1-2 months. EXAM: BILATERAL LOWER EXTREMITY VENOUS DOPPLER ULTRASOUND TECHNIQUE: Gray-scale sonography with graded compression, as well as color Doppler and duplex ultrasound were performed to evaluate the lower extremity deep venous systems from the level of the common femoral vein and including the common femoral, femoral, profunda femoral, popliteal and calf veins including the posterior tibial, peroneal and gastrocnemius veins when visible. The superficial great saphenous vein was also interrogated. Spectral Doppler was utilized to evaluate flow at rest and with distal augmentation maneuvers in the common femoral, femoral and popliteal veins. COMPARISON:  None Available. FINDINGS: RIGHT LOWER EXTREMITY Common Femoral Vein: No evidence of thrombus. Normal compressibility, respiratory phasicity and response to augmentation. Saphenofemoral Junction: No evidence of thrombus. Normal compressibility and flow on color Doppler imaging. Profunda Femoral Vein: No evidence of thrombus. Normal compressibility and flow on color Doppler imaging. Femoral Vein: No evidence of thrombus. Normal compressibility, respiratory phasicity and response to augmentation. Popliteal Vein: No evidence of thrombus. Normal  compressibility, respiratory phasicity and response to augmentation. Calf Veins: No evidence of thrombus. Normal compressibility and flow on color Doppler imaging. Superficial Great Saphenous Vein: No evidence of thrombus. Normal compressibility. Venous Reflux:  None. Other Findings:  None. LEFT LOWER EXTREMITY Common Femoral Vein: No evidence of thrombus. Normal compressibility, respiratory phasicity and response to augmentation. Saphenofemoral Junction: No evidence of thrombus. Normal compressibility and flow on color Doppler imaging. Profunda Femoral Vein: No evidence of thrombus. Normal compressibility and flow  on color Doppler imaging. Femoral Vein: The LEFT femoral vein is limited in visualization. Popliteal Vein: No evidence of thrombus. Normal compressibility, respiratory phasicity and response to augmentation. Calf Veins: No evidence of thrombus. Normal compressibility and flow on color Doppler imaging. Superficial Great Saphenous Vein: No evidence of thrombus. Normal compressibility. Venous Reflux:  None. Other Findings:  None. IMPRESSION: Limited visualization of the LEFT femoral vein without evidence of deep venous thrombosis in either lower extremity. Electronically Signed   By: Virgle Grime M.D.   On: 07/20/2023 19:25   DG Tibia/Fibula Right Result Date: 07/20/2023 CLINICAL DATA:  Leg swelling and cellulitis. Evaluate for soft tissue gas. EXAM: RIGHT TIBIA AND FIBULA - 2 VIEW COMPARISON:  None Available. FINDINGS: There is no evidence of fracture or other focal bone lesions. There is diffuse soft tissue swelling of the lower extremity. No definite soft tissue gas identified. Peripheral vascular calcifications are present. IMPRESSION: Diffuse soft tissue swelling of the lower extremity. No definite soft tissue gas identified. Electronically Signed   By: Tyron Gallon M.D.   On: 07/20/2023 18:46     Procedures   Medications Ordered in the ED  linezolid (ZYVOX) IVPB 600 mg (600 mg  Intravenous New Bag/Given 07/20/23 1902)  fentaNYL (SUBLIMAZE) injection 50 mcg (50 mcg Intravenous Given 07/20/23 1653)  morphine  (PF) 4 MG/ML injection 4 mg (4 mg Intravenous Given 07/20/23 1819)  Ampicillin-Sulbactam (UNASYN) 3 g in sodium chloride  0.9 % 100 mL IVPB (0 g Intravenous Stopped 07/20/23 1900)    CRITICAL CARE Performed by: Marine Sia Gurinder Toral Total critical care time: 35 minutes Critical care time was exclusive of separately billable procedures and treating other patients. Critical care was necessary to treat or prevent imminent or life-threatening deterioration. Critical care was time spent personally by me on the following activities: development of treatment plan with patient and/or surrogate as well as nursing, discussions with consultants, evaluation of patient's response to treatment, examination of patient, obtaining history from patient or surrogate, ordering and performing treatments and interventions, ordering and review of laboratory studies, ordering and review of radiographic studies, pulse oximetry and re-evaluation of patient's condition.                                  Medical Decision Making Amount and/or Complexity of Data Reviewed Labs: ordered. Radiology: ordered.  Risk Prescription drug management. Decision regarding hospitalization.    Tracy Mann is a 89 y.o. female with a past medical history significant for atrial fibrillation on Eliquis  therapy, CAD with previous MI, hypertension, diabetes, thyroid  disease, hyperlipidemia, venous insufficiency with chronic leg edema, previous cholecystectomy, and previous leg cellulitis status post previous antibiotic completion several weeks ago and currently on antibiotics who was sent from PCP for worsening leg redness pain and cellulitis.  According to patient, for several month she has been having swelling in her legs and has been battling some on-and-off cellulitis.  She reports complete antibiotic several  weeks ago and things were doing better within her last of days that started worsening again.  She started on antibiotics yesterday morning and has now taken 3 doses before seeing her PCP today who saw her legs and sent her here for the emergency department evaluation.  She reports that the redness is spreading and worsening in 7 worsened pain.  They are more edematous.  She is reporting some chills and malaise.  She reports having some pain going towards her upper  legs and flanks.  She denies any nausea vomiting constipation or diarrhea and denies urinary changes.  She denies any chest pain shortness of breath or cough at this time.  She continues take her medications otherwise but says that the legs look much worse than they did 36 hours ago.  I took down her dressings and evaluate her legs and she does have cellulitis and wounds on both legs right worse than left.  She does have intact sensation strength and pulses distally.  She is on blood thinners and says she has not had a blood clot before.  Hips and abdomen nontender.  Flanks nontender initially.  Lungs clear.  Clinically I am concerned about worsening cellulitis.  We will get x-ray to look for subcutaneous gas and will get screening labs.  We will also get ultrasound to rule out DVT given the pain going up into her legs.  With her flank soreness we will also get a urinalysis to look for UTI given her chills.  Anticipate reassessment after workup to determine disposition.  Based on her symptoms and progression of symptoms per PCP, anticipate she will likely need admission for IV antibiotics given her failing outpatient antibiotic management.  6:08 PM Patient's redness on her legs does appear to have spread since she has been here in the emergency department.  Her creatinine is higher than prior that appears to show AKI.  She does not have a leukocytosis or lactic acidosis however I am concerned about this worsening cellulitis.  Her urinalysis does  not show clear evidence of UTI and her metabolic panel otherwise did show some hyperkalemia but LFTs were normal.  Lipase normal.  Her proBNP is 1726 and given her age that is near elevated.  Still waiting on the results of her x-ray of the tib-fib but I do not see evidence of subcutaneous gas or clear osteomyelitis initially.  Ultrasound of the legs is being performed to look for DVT but I have low suspicion for this at this time.  Due to the symptoms worsening and her needing fentanyl and now morphine  for pain control, do not feel she is safe for discharge home given her failing outpatient antibiotics.  I called and spoke to pharmacy who recommended linezolid and Unasyn.  Will call medicine for admission.     Final diagnoses:  Cellulitis of lower extremity, unspecified laterality    Clinical Impression: 1. Cellulitis of lower extremity, unspecified laterality     Disposition: Admit  This note was prepared with assistance of Dragon voice recognition software. Occasional wrong-word or sound-a-like substitutions may have occurred due to the inherent limitations of voice recognition software.     Hildegarde Dunaway, Marine Sia, MD 07/20/23 1929

## 2023-07-20 NOTE — ED Notes (Signed)
 [place] at bedside

## 2023-07-20 NOTE — ED Triage Notes (Signed)
 Pt c/o cellulitis BLE, R leg being worse, been dealing w it for over a month. Seen today by PCP, advised to come to ED for further eval Started abx yesterday, but he didn't think I should wait to be seen until tomorrow.  Denies fevers, SHOB, additional symptoms

## 2023-07-20 NOTE — Assessment & Plan Note (Signed)
 Follows with pain management, last seen 07/18/23.

## 2023-07-20 NOTE — ED Notes (Addendum)
 1 set of blood cultures drawn and sent to lab. Unable to get second set at this time.

## 2023-07-20 NOTE — H&P (Signed)
 History and Physical    Tracy Mann AVW:[id number] DOB: 01-31-1934 DOA: 07/20/2023  PCP: Nikki Barters, MD   Patient coming from: Home   Chief Complaint: Bilateral leg swelling with redness concern for infection  ED TRIAGE note:  Pt c/o cellulitis BLE, R leg being worse, been dealing w it for over a month. Seen today by PCP, advised to come to ED for further eval Started abx yesterday, but he didn't think I should wait to be seen until tomorrow.      HPI:  Tracy Mann is a 89 y.o. female with medical history significant of chronic atrial fibrillation on Eliquis , ischemic cardiomyopathy, chronic systolic and diastolic heart failure reduced EF 30 to 35% %, hyperlipidemia, obesity, pulmonary hypertension, chronic bilateral lower extremity wound, insulin -dependent DM type II, anxiety, CAD, CKD stage IIIb, chronic pain syndrome on opioids, chronic bilateral lower extremity venous insufficiency with associated swelling presented to emergency department complaining of bilateral leg swelling, redness and pain.  Patient has been treated for cellulitis 5/8 with course of doxycycline  afterward being treated with Augmentin  on 5/15. Seen by PCP yesterday and started on augmentin .    Vitals: stable, afebrile Pertinent labs: potassium: 5.5, BUN: 64, creatinine: 2.69 (1.9), probnp: 1726, BC pending Dvt imaging: No evidence of DVT. Tib/fib xray: Diffuse soft tissue swelling of the lower extremity without any definite soft tissue gas identified. In Ed: given zyvox and unasyn, he discussed with pharmacy due to her allergies.  Has been transferred from a [facility] to [facility] for further management of bilateral lower extremity cellulitis, acute kidney injury on CKD stage IIIb and hyperkalemia.   During my evaluation at the bedside patient reported that she has noticed worsening redness with itchiness and erythema of the right sided lower extremity and the size of the wound  has been progressively getting worse.  Has been taking antibiotic initially improved the size of the infection that is getting worse.  Patient reported she follows outpatient pain clinic for chronic pain management. Also reported she has not been drinking much water with concern for going to bathroom multiple times. Patient denies any fever, chill, nausea, vomiting and diarrhea.   Significant labs in the ED: Lab Orders         Blood culture (routine x 2)         CBC with Differential         Comprehensive metabolic panel         Lipase, blood         Urinalysis, w/ Reflex to Culture (Infection Suspected) -Urine, Clean Catch         Pro Brain natriuretic peptide         Glucose, capillary         Creatinine, urine, random         Urinalysis, Routine w reflex microscopic -Urine, Clean Catch         Sodium, urine, random         Comprehensive metabolic panel         CBC         Hemoglobin A1c       Review of Systems:  Review of Systems  Constitutional:  Negative for chills, fever, malaise/fatigue and weight loss.  Respiratory:  Negative for cough, sputum production and shortness of breath.   Cardiovascular:  Negative for chest pain and palpitations.  Gastrointestinal:  Negative for abdominal pain, diarrhea, heartburn, nausea and vomiting.  Genitourinary:  Negative for dysuria,  frequency and urgency.  Musculoskeletal:  Negative for back pain, falls, joint pain, myalgias and neck pain.       Bilateral lower extremity redness and infection  Neurological:  Negative for dizziness and headaches.    Past Medical History:  Diagnosis Date   Acute myocardial infarction of inferior wall (HCC)    a. 10/2005 - Presumed to be 2/2 to a coronary embolus in setting of persistent Afib-->Cath 2014 relatively nl cors, EF 45-50% w/ severe distal inferior/apical inferior HK w/ aneursymal appearance.   Arthritis    Chronic anticoagulation    a. warfarin.   Chronic atrial fibrillation (HCC)    a.  CHA2DS2VASc = 6--> warfarin.   Chronic combined systolic and diastolic CHF (congestive heart failure) (HCC)    a. 10/2012 Echo: EF 35-40%, diff HK, mild MR, mild biatrial enlargement;  b. 11/2012 EF 45-50% by LV gram; c. echo 07/2014: EF 40-45%, mod conc LVH, mod MR, severe biatrial enlargement, PASP 45 mm Hg c. Echo 01/2018 EF 45-50%, duffuse hypokinesis, mild MR, LA mod dilated, norm RVSF, dilated IVC   Essential hypertension    Hyperlipidemia, mixed    Mixed Ischemic/Nonischemic Cardiomyopathy    a. 10/2012 Echo: EF 35-40%;  b. 11/2012 EF 45-50% by LV gram.   Nonobstructive coronary artery disease    a. 2007 inferior wall MI thought to be secondary to thrombus from atrial fib b. 2014 cath with R dominant coronary arterial system with mild luminal irregularities c. Myoview  05/2015 old inferior MI, no new ischemic changes   Obesity    Thyroid  disease    hypothyroidism   Type II diabetes mellitus (HCC)    a. 07/2018 A1c 8.7 - long term insulin  use   Venous insufficiency    Vitamin D deficiency     Past Surgical History:  Procedure Laterality Date   CARDIAC CATHETERIZATION  11/2012   [facility];EF 45-50%   CHOLECYSTECTOMY  1999   TUBAL LIGATION  1968   VESICOVAGINAL FISTULA CLOSURE W/ TAH  1996     reports that she has never smoked. She has never used smokeless tobacco. She reports that she does not drink alcohol and does not use drugs.  Allergies  Allergen Reactions   Clindamycin  Shortness Of Breath and Swelling   Clindamycin /Lincomycin Shortness Of Breath and Swelling   Duloxetine  Other (See Comments)    Same as Paroxetine    Paroxetine  Other (See Comments)    Horrible headache, Stomach pain, Diarrhea   Sulfa Antibiotics Anaphylaxis   Cephalexin  Other (See Comments)    Blisters   Clarithromycin Other (See Comments)    Hives, headaches, hard time swelling, felt like throat was closing up  Hives, headaches, hard time swelling, felt like throat was closing up    Hives, headaches,  hard time swelling, felt like throat was closing up Hives, headaches, hard time swelling, felt like throat was closing up   Diphenhydramine Other (See Comments)   Estrogens Conjugated    Estrogens Conjugated    Nitrofurantoin  Nausea Only    Family History  Problem Relation Age of Onset   Heart disease Mother    Heart attack Mother    Thyroid  disease Father    Heart disease Brother    Heart disease Brother     Prior to Admission medications   Medication Sig Start Date End Date Taking? Authorizing Provider  amoxicillin -clavulanate (AUGMENTIN ) 875-125 MG tablet Take 875 mg by mouth 2 (two) times daily. 07/19/23 07/29/23 Yes [provider]  albuterol  (VENTOLIN  HFA) 108 (90  Base) MCG/ACT inhaler TAKE 2 PUFFS BY MOUTH EVERY 6 HOURS AS NEEDED FOR WHEEZE OR SHORTNESS OF BREATH Patient taking differently: Inhale 2 puffs into the lungs every 6 (six) hours as needed for shortness of breath or wheezing. 03/19/20   Nikki Barters, MD  apixaban  (ELIQUIS ) 2.5 MG TABS tablet Take 1 tablet (2.5 mg total) by mouth 2 (two) times daily. 07/10/23   [name], Timothy J, MD  estradiol  (ESTRACE ) 0.1 MG/GM vaginal cream Place 1 Applicatorful vaginally daily. 12/21/22   [provider]  ezetimibe  (ZETIA ) 10 MG tablet Take 1 tablet (10 mg total) by mouth daily. 07/26/21   Nikki Barters, MD  flavoxATE (URISPAS) 100 MG tablet Take 1 tablet (100 mg total) by mouth 2 (two) times daily as needed for bladder spasms. 10/17/21   Erman Hayward, MD  fluticasone  (FLONASE ) 50 MCG/ACT nasal spray Place 2 sprays into both nostrils daily. 01/06/22   Trenton Frock, PA-C  HYDROcodone -acetaminophen  (NORCO) 10-325 MG tablet Take 1 tablet by mouth every 8 (eight) hours as needed for severe pain (pain score 7-10). Must last 30 days. 07/20/23 08/19/23  Patel, Seema K, NP  HYDROcodone -acetaminophen  (NORCO) 10-325 MG tablet Take 1 tablet by mouth every 8 (eight) hours as needed for severe pain (pain score 7-10). Must  last 30 days. 08/19/23 09/18/23  Patel, Seema K, NP  HYDROcodone -acetaminophen  (NORCO) 10-325 MG tablet Take 1 tablet by mouth every 8 (eight) hours as needed for severe pain (pain score 7-10). Must last 30 days. 09/18/23 10/18/23  Patel, Seema K, NP  hydrocortisone  cream 0.5 % Apply 1 Application topically 2 (two) times daily. 06/04/23   Collis Deaner, MD  insulin  glargine (LANTUS  SOLOSTAR) 100 UNIT/ML Solostar Pen Inject 18-24 Units into the skin at bedtime. 05/20/19   [provider]  insulin  lispro (HUMALOG ) 100 UNIT/ML KwikPen Inject 10-25 Units into the skin in the morning, at noon, and at bedtime. 10/12/20   [provider]  Insulin  Pen Needle (RELION PEN NEEDLE 31G/8MM) 31G X 8 MM MISC Use with insulin  pen three times daily 03/24/19   Nikki Barters, MD  levothyroxine  (SYNTHROID ) 25 MCG tablet Take 25 mcg by mouth daily before breakfast.    [provider]  lisinopril  (ZESTRIL ) 10 MG tablet Take 1 tablet (10 mg total) by mouth daily. 12/14/22   [name], Timothy J, MD  LORazepam  (ATIVAN ) 0.5 MG tablet Take 0.5 mg by mouth 2 (two) times daily. 10/19/22   [provider]  Magnesium  500 MG CAPS Take 1,000 mg by mouth daily.     [provider]  mupirocin  cream (BACTROBAN ) 2 % Apply topically 3 (three) times daily. 11/01/22   Patel, Sona, MD  naloxone  (NARCAN ) nasal spray 4 mg/0.1 mL Place 1 spray into the nose as needed for up to 365 doses (for opioid-induced respiratory depresssion). In case of emergency (overdose), spray once into each nostril. If no response within 3 minutes, repeat application and call 911. 10/18/22 10/18/23  Renaldo Caroli, MD  nitroGLYCERIN  (NITROSTAT ) 0.4 MG SL tablet Place 1 tablet (0.4 mg total) under the tongue every 5 (five) minutes as needed for chest pain. 04/20/23   [name], Timothy J, MD  ondansetron  (ZOFRAN ) 4 MG tablet Take 1 tablet (4 mg total) by mouth every 12 (twelve) hours as needed for nausea or vomiting. 07/26/21    Nikki Barters, MD  pantoprazole  (PROTONIX ) 40 MG tablet Take 1 tablet (40 mg total) by mouth 2 (two) times daily before a meal. 11/07/22 07/18/23  Feliciana Horn, MD  sucralfate  (CARAFATE ) 1 GM/10ML suspension Take 10 mLs (1 g total) by mouth 4 (four) times daily -  with meals and at bedtime for 15 days. 11/07/22 07/18/23  Akula, Vijaya, MD  torsemide  (DEMADEX ) 20 MG tablet Take 2 tablets (40 mg total) by mouth daily. 06/20/22   Devorah Fonder, MD     Physical Exam: Vitals:   07/20/23 1830 07/20/23 1900 07/20/23 2014 07/20/23 2055  BP: 116/64 (!) 127/51 (!) 112/48   Pulse: 82 78 79   Resp:  (!) 22 16   Temp:   97.7 F (36.5 C)   TempSrc:   Oral   SpO2: 96% 93% 95%   Weight:    89.3 kg  Height:    5' 3 (1.6 m)    Physical Exam Constitutional:      Appearance: She is not ill-appearing.  HENT:     Mouth/Throat:     Mouth: Mucous membranes are dry.   Eyes:     Pupils: Pupils are equal, round, and reactive to light.    Cardiovascular:     Rate and Rhythm: Regular rhythm.     Pulses: Normal pulses.     Heart sounds: Normal heart sounds.  Pulmonary:     Effort: Pulmonary effort is normal.     Breath sounds: Normal breath sounds.  Abdominal:     Palpations: Abdomen is soft.   Musculoskeletal:        General: No swelling, tenderness, deformity or signs of injury.     Cervical back: Neck supple.     Right lower leg: No edema.     Left lower leg: Edema present.   Skin:    Capillary Refill: Capillary refill takes less than 2 seconds.     Findings: Erythema present.   Neurological:     Mental Status: She is alert and oriented to person, place, and time.   Psychiatric:        Mood and Affect: Mood normal.        Thought Content: Thought content normal.        Judgment: Judgment normal.      Media Information   Document Information  Photos    07/20/2023 21:53  Attached To:  Hospital Encounter on 07/20/23  Source Information  Jetty Mort, MD  [facility]     Media Information   Document Information  Photos  RLE wound  07/20/2023 21:06  Attached To:  Hospital Encounter on 07/20/23  Source Information  Kelly Patient, RN  [facility]   Labs on Admission: I have personally reviewed following labs and imaging studies  CBC: Recent Labs  Lab 07/20/23 1546  WBC 9.5  NEUTROABS 6.9  HGB 10.6*  HCT 32.7*  MCV 93.4  PLT 226   Basic Metabolic Panel: Recent Labs  Lab 07/20/23 1546  NA 140  K 5.5*  CL 104  CO2 25  GLUCOSE 126*  BUN 64*  CREATININE 2.69*  CALCIUM  9.7   GFR: Estimated Creatinine Clearance: 15.3 mL/min (A) (by C-G formula based on SCr of 2.69 mg/dL (H)). Liver Function Tests: Recent Labs  Lab 07/20/23 1546  AST 24  ALT 9  ALKPHOS 80  BILITOT 0.4  PROT 7.2  ALBUMIN 3.8   Recent Labs  Lab 07/20/23 1546  LIPASE 32   No results for input(s): AMMONIA in the last 168 hours. Coagulation Profile: No results for input(s): INR, PROTIME in the last 168 hours. Cardiac Enzymes: No results for input(s):  CKTOTAL, CKMB, CKMBINDEX, TROPONINI, TROPONINIHS in the last 168 hours. BNP (last 3 results) No results for input(s): BNP in the last 8760 hours. HbA1C: No results for input(s): HGBA1C in the last 72 hours. CBG: Recent Labs  Lab 07/20/23 2107  GLUCAP 103*   Lipid Profile: No results for input(s): CHOL, HDL, LDLCALC, TRIG, CHOLHDL, LDLDIRECT in the last 72 hours. Thyroid  Function Tests: No results for input(s): TSH, T4TOTAL, FREET4, T3FREE, THYROIDAB in the last 72 hours. Anemia Panel: No results for input(s): VITAMINB12, FOLATE, FERRITIN, TIBC, IRON, RETICCTPCT in the last 72 hours. Urine analysis:    Component Value Date/Time   COLORURINE YELLOW 07/20/2023 1608   APPEARANCEUR CLEAR 07/20/2023 1608   APPEARANCEUR Clear 05/12/2021 0953   LABSPEC 1.015 07/20/2023 1608   PHURINE 7.0 07/20/2023 1608   GLUCOSEU NEGATIVE  07/20/2023 1608   HGBUR NEGATIVE 07/20/2023 1608   BILIRUBINUR NEGATIVE 07/20/2023 1608   BILIRUBINUR Negative 10/13/2021 1329   BILIRUBINUR Negative 05/12/2021 0953   KETONESUR NEGATIVE 07/20/2023 1608   PROTEINUR NEGATIVE 07/20/2023 1608   UROBILINOGEN 0.2 10/13/2021 1329   NITRITE NEGATIVE 07/20/2023 1608   LEUKOCYTESUR SMALL (A) 07/20/2023 1608    Radiological Exams on Admission: I have personally reviewed images US  Venous Img Lower Bilateral (DVT) Result Date: 07/20/2023 CLINICAL DATA:  Bilateral leg swelling, pain and redness for 1-2 months. EXAM: BILATERAL LOWER EXTREMITY VENOUS DOPPLER ULTRASOUND TECHNIQUE: Gray-scale sonography with graded compression, as well as color Doppler and duplex ultrasound were performed to evaluate the lower extremity deep venous systems from the level of the common femoral vein and including the common femoral, femoral, profunda femoral, popliteal and calf veins including the posterior tibial, peroneal and gastrocnemius veins when visible. The superficial great saphenous vein was also interrogated. Spectral Doppler was utilized to evaluate flow at rest and with distal augmentation maneuvers in the common femoral, femoral and popliteal veins. COMPARISON:  None Available. FINDINGS: RIGHT LOWER EXTREMITY Common Femoral Vein: No evidence of thrombus. Normal compressibility, respiratory phasicity and response to augmentation. Saphenofemoral Junction: No evidence of thrombus. Normal compressibility and flow on color Doppler imaging. Profunda Femoral Vein: No evidence of thrombus. Normal compressibility and flow on color Doppler imaging. Femoral Vein: No evidence of thrombus. Normal compressibility, respiratory phasicity and response to augmentation. Popliteal Vein: No evidence of thrombus. Normal compressibility, respiratory phasicity and response to augmentation. Calf Veins: No evidence of thrombus. Normal compressibility and flow on color Doppler imaging. Superficial  Great Saphenous Vein: No evidence of thrombus. Normal compressibility. Venous Reflux:  None. Other Findings:  None. LEFT LOWER EXTREMITY Common Femoral Vein: No evidence of thrombus. Normal compressibility, respiratory phasicity and response to augmentation. Saphenofemoral Junction: No evidence of thrombus. Normal compressibility and flow on color Doppler imaging. Profunda Femoral Vein: No evidence of thrombus. Normal compressibility and flow on color Doppler imaging. Femoral Vein: The LEFT femoral vein is limited in visualization. Popliteal Vein: No evidence of thrombus. Normal compressibility, respiratory phasicity and response to augmentation. Calf Veins: No evidence of thrombus. Normal compressibility and flow on color Doppler imaging. Superficial Great Saphenous Vein: No evidence of thrombus. Normal compressibility. Venous Reflux:  None. Other Findings:  None. IMPRESSION: Limited visualization of the LEFT femoral vein without evidence of deep venous thrombosis in either lower extremity. Electronically Signed   By: Virgle Grime M.D.   On: 07/20/2023 19:25   DG Tibia/Fibula Right Result Date: 07/20/2023 CLINICAL DATA:  Leg swelling and cellulitis. Evaluate for soft tissue gas. EXAM: RIGHT TIBIA AND FIBULA - 2 VIEW COMPARISON:  None Available. FINDINGS: There is no evidence of fracture or other focal bone lesions. There is diffuse soft tissue swelling of the lower extremity. No definite soft tissue gas identified. Peripheral vascular calcifications are present. IMPRESSION: Diffuse soft tissue swelling of the lower extremity. No definite soft tissue gas identified. Electronically Signed   By: Tyron Gallon M.D.   On: 07/20/2023 18:46     Assessment/Plan: Principal Problem:   Acute kidney injury superimposed on chronic kidney disease (HCC) Active Problems:   Chronic pain syndrome   Lower extremity cellulitis   Hyperlipidemia   Clinical depression   Anxiety, generalized   Hypothyroidism    Obstructive sleep apnea   Insulin  dependent type 2 diabetes mellitus (HCC)   Chronic atrial fibrillation (HCC)   Chronic venous insufficiency   CKD stage 3b, GFR 30-44 ml/min (HCC)   Essential hypertension   Bilateral lower leg cellulitis   Hyperkalemia   History of CAD (coronary artery disease)   Combined congestive systolic and diastolic heart failure (HCC)    Assessment and Plan: Bilateral lower extremity cellulitis Chronic bilateral lower extremity venous insufficiency -Presenting with worsening bilateral lower extremity swelling redness and pain.  At presentation to ED patient is hemodynamically stable.  CBC evidence of leukocytosis per patient is afebrile.  Physical exam revealed concern for cellulitis of bilateral lower extremity recently has been treated with Keflex  and Augmentin  x 2 without much improvement of the same. -History of penicillin allergy. -Physical exam revealed right lower extremity anterior lower 2/3 shin area erythema with superficial skin change.  Chronic bilateral lower extremities venous stasis ulcer. - In the ED patient has been treated with vancomycin  and Unasyn without any complication. - Blood cultures are in process. - Continue broad-spectrum antibiotic coverage IV vancomycin  and Unasyn pharmacy consult. Applying Xeroform dressing.  Consulting wound care  Acute kidney injury superimposed on CKD stage IIIb -Elevated creatinine 2.69.  Concern for prerenal acute kidney injury in the setting of acute infectious process and hypotension.  Starting low dose maintenance fluid LR 75 cc/h given patient has history of CHF with reduced EF 30%. - Checking UA, urine creatinine and sodium.  Monitor renal function.  E avoid nephrotoxic agent and renally adjust medication.  Hyperkalemia -Elevated potassium 5.5.  Hyperkalemia in setting of AKI.  Giving Lokelma 10 g once. -Obtaining EKG.  Hyperlipidemia -Continue ezetimibe .  Combined systolic and diastolic heart  failure reduced EF 30 to 35% Essential hypertension -Holding lisinopril  and torsemide  in the setting of AKI and hypotension..  Generalized anxiety disorder -Pending pharmacy verification of Ativan  at home  Hypothyroidism -Continue levothyroxine   History of obstructive sleep apnea -Continue check pulse ox and supplemental oxygen at bedtime  Insulin -dependent DM type II -At home patient is on multiple different insulin  regimen not sure which has been patient presently taking.  Pending pharmacy verification of home medications. - Continue sliding scale insulin  coverage with mealtime  Chronic atrial fibrillation -Obtaining EKG.  At home not on rate control medication.  Continue Eliquis  2.5 mg twice daily.  History of CAD Continue Eliquis  and ezetimibe   Chronic pain syndrome -Continue home Norco as needed  Reactive airway disease - Continue albuterol  as needed   DVT prophylaxis:  Eliquis  Code Status:  Full Code Diet: Heart healthy carb modified diet Family Communication:   Family was present at bedside, at the time of interview. Opportunity was given to ask question and all questions were answered satisfactorily.  Disposition Plan: Pending blood culture result for appropriate antibiotic guidance. Consults: Wound care consult  Admission status:   Inpatient, Telemetry bed  Severity of Illness: The appropriate patient status for this patient is INPATIENT. Inpatient status is judged to be reasonable and necessary in order to provide the required intensity of service to ensure the patient's safety. The patient's presenting symptoms, physical exam findings, and initial radiographic and laboratory data in the context of their chronic comorbidities is felt to place them at high risk for further clinical deterioration. Furthermore, it is not anticipated that the patient will be medically stable for discharge from the hospital within 2 midnights of admission.   * I certify that at the point  of admission it is my clinical judgment that the patient will require inpatient hospital care spanning beyond 2 midnights from the point of admission due to high intensity of service, high risk for further deterioration and high frequency of surveillance required.Aaron Aas    Aurie Harroun, MD [facility]  How to contact the [facility] Attending or Consulting provider 7A - 7P or covering provider during after hours 7P -7A, for this patient.  Check the care team in [facility] and look for a) attending/consulting [facility] provider listed and b) the [facility] team listed Log into [email or web address] and use [place]'s universal password to access. If you do not have the password, please contact the hospital operator. Locate the [facility] provider you are looking for under [facility] and page to a number that you can be directly reached. If you still have difficulty reaching the provider, please page the [facility] (Director on Call) for the Hospitalists listed on amion for assistance.  07/20/2023, 10:09 PM

## 2023-07-20 NOTE — Progress Notes (Signed)
 Pharmacy Antibiotic Note  Tracy Mann is a 89 y.o. female admitted on 07/20/2023 with LE cellulitis.  Pharmacy has been consulted for Vancomycin  and Unasyn dosing. Noted pt with recent courses of doxy and augmentin  for same thing in May.  Pt received dose of Unasyn and Zyvox in ED  SCr up to 2.69 (was ~2 one month ago)  Plan: Unasyn 3gm IV q12h Vancomycin  1750 mg IV in a.m. (~12h post Zyvox dose). Will f/u renal function for further dosing. Will f/u micro data, and pt's clinical condition Vanc levels prn   Height: 5' 3 (160 cm) Weight: 89.3 kg (196 lb 13.9 oz) IBW/kg (Calculated) : 52.4  Temp (24hrs), Avg:98.1 F (36.7 C), Min:97.7 F (36.5 C), Max:98.4 F (36.9 C)  Recent Labs  Lab 07/20/23 1546  WBC 9.5  CREATININE 2.69*  LATICACIDVEN 1.2    Estimated Creatinine Clearance: 15.3 mL/min (A) (by C-G formula based on SCr of 2.69 mg/dL (H)).    Allergies  Allergen Reactions   Clindamycin  Shortness Of Breath and Swelling   Clindamycin /Lincomycin Shortness Of Breath and Swelling   Duloxetine  Other (See Comments)    Same as Paroxetine    Paroxetine  Other (See Comments)    Horrible headache, Stomach pain, Diarrhea   Sulfa Antibiotics Anaphylaxis   Cephalexin  Other (See Comments)    Blisters   Clarithromycin Other (See Comments)    Hives, headaches, hard time swelling, felt like throat was closing up  Hives, headaches, hard time swelling, felt like throat was closing up    Hives, headaches, hard time swelling, felt like throat was closing up Hives, headaches, hard time swelling, felt like throat was closing up   Diphenhydramine Other (See Comments)   Estrogens Conjugated    Estrogens Conjugated    Nitrofurantoin  Nausea Only    Antimicrobials this admission: Linezolid 6/20 x 1 6/20 Unasyn >>  6/21 Vanc >>   Microbiology results: 6/20 BCx:   Thank you for allowing pharmacy to be a part of this patient's care.  Enrigue Harvard, PharmD, BCPS Please see amion for  complete clinical pharmacist phone list 07/20/2023 9:49 PM

## 2023-07-20 NOTE — Progress Notes (Signed)
 Plan of Care Note for accepted transfer   Patient: Tracy Mann MRN: [id number]   DOA: 07/20/2023  Facility requesting transfer: [facility] Requesting Provider: Dr. Lorelie Rohrer  Reason for transfer: cellulitis of LE  Facility course: 89 year old female with past medical history significant for atrial fibrillation, anxiety, HTN, CAD, CKD stage 3b, T2DM, HLD, chronic pain on opiates, chronic venous insufficiency, combined CHF with EF of 30% in 2023 who presented to ED with complaints of bilateral leg swelling, redness and pain. Was treated for cellulitis on 5/8 and given course of doxycyline then put on augmentin  on 5/15.   Seen by PCP yesterday and started on augmentin .   Vitals: stable, afebrile Pertinent labs: potassium: 5.5, BUN: 64, creatinine: 2.69 (1.9), probnp: 1726, BC pending Dvt imaging: pending Tib/fib xray: pending In Ed: given zyvox and unasyn, he discussed with pharmacy due to her allergies. [facility] asked to admit.   Plan of care: The patient is accepted for admission to Telemetry unit, at [facility] or [facility].Aaron Aas    Author: Raymona Caldwell, MD 07/20/2023  Check [email or web address] for on-call coverage.  Nursing staff, Please call [facility] Admits & Consults System-Wide number on Amion as soon as patient's arrival, so appropriate admitting provider can evaluate the pt.

## 2023-07-21 DIAGNOSIS — N189 Chronic kidney disease, unspecified: Secondary | ICD-10-CM

## 2023-07-21 DIAGNOSIS — N179 Acute kidney failure, unspecified: Secondary | ICD-10-CM | POA: Diagnosis not present

## 2023-07-21 LAB — COMPREHENSIVE METABOLIC PANEL WITH GFR
ALT: 12 U/L (ref 0–44)
AST: 21 U/L (ref 15–41)
Albumin: 3.1 g/dL — ABNORMAL LOW (ref 3.5–5.0)
Alkaline Phosphatase: 57 U/L (ref 38–126)
Anion gap: 8 (ref 5–15)
BUN: 57 mg/dL — ABNORMAL HIGH (ref 8–23)
CO2: 24 mmol/L (ref 22–32)
Calcium: 9.1 mg/dL (ref 8.9–10.3)
Chloride: 108 mmol/L (ref 98–111)
Creatinine, Ser: 2.27 mg/dL — ABNORMAL HIGH (ref 0.44–1.00)
GFR, Estimated: 20 mL/min — ABNORMAL LOW (ref >60)
Glucose, Bld: 100 mg/dL — ABNORMAL HIGH (ref 70–99)
Potassium: 4.9 mmol/L (ref 3.5–5.1)
Sodium: 140 mmol/L (ref 135–145)
Total Bilirubin: 0.7 mg/dL (ref 0.0–1.2)
Total Protein: 6.3 g/dL — ABNORMAL LOW (ref 6.5–8.1)

## 2023-07-21 LAB — URINALYSIS, ROUTINE W REFLEX MICROSCOPIC
Bilirubin Urine: NEGATIVE
Glucose, UA: NEGATIVE mg/dL
Hgb urine dipstick: NEGATIVE
Ketones, ur: NEGATIVE mg/dL
Nitrite: NEGATIVE
Protein, ur: NEGATIVE mg/dL
Specific Gravity, Urine: 1.015 (ref 1.005–1.030)
pH: 5 (ref 5.0–8.0)

## 2023-07-21 LAB — CBC
HCT: 33.1 % — ABNORMAL LOW (ref 36.0–46.0)
Hemoglobin: 10.5 g/dL — ABNORMAL LOW (ref 12.0–15.0)
MCH: 30.1 pg (ref 26.0–34.0)
MCHC: 31.7 g/dL (ref 30.0–36.0)
MCV: 94.8 fL (ref 80.0–100.0)
Platelets: 198 10*3/uL (ref 150–400)
RBC: 3.49 MIL/uL — ABNORMAL LOW (ref 3.87–5.11)
RDW: 13.2 % (ref 11.5–15.5)
WBC: 8.3 10*3/uL (ref 4.0–10.5)
nRBC: 0 % (ref 0.0–0.2)

## 2023-07-21 LAB — CREATININE, URINE, RANDOM: Creatinine, Urine: 87 mg/dL

## 2023-07-21 LAB — GLUCOSE, CAPILLARY
Glucose-Capillary: 85 mg/dL (ref 70–99)
Glucose-Capillary: 102 mg/dL — ABNORMAL HIGH (ref 70–99)
Glucose-Capillary: 118 mg/dL — ABNORMAL HIGH (ref 70–99)
Glucose-Capillary: 109 mg/dL — ABNORMAL HIGH (ref 70–99)
Glucose-Capillary: 132 mg/dL — ABNORMAL HIGH (ref 70–99)

## 2023-07-21 LAB — HEMOGLOBIN A1C
Hgb A1c MFr Bld: 6.9 % — ABNORMAL HIGH (ref 4.8–5.6)
Mean Plasma Glucose: 151.33 mg/dL

## 2023-07-21 LAB — SODIUM, URINE, RANDOM: Sodium, Ur: 49 mmol/L

## 2023-07-21 MED ORDER — GUAIFENESIN 100 MG/5ML PO LIQD: 5.0000 mL | ORAL | Status: DC | PRN

## 2023-07-21 MED ORDER — LINEZOLID 600 MG PO TABS
600.0000 mg | ORAL_TABLET | Freq: Two times a day (BID) | ORAL | Status: DC
  Administered 2023-07-22: 600 mg via ORAL
  Filled 2023-07-21 (×2): qty 1

## 2023-07-21 MED ORDER — PANTOPRAZOLE SODIUM 40 MG PO TBEC
40.0000 mg | DELAYED_RELEASE_TABLET | Freq: Every day | ORAL | Status: DC
  Administered 2023-07-22: 40 mg via ORAL
  Filled 2023-07-21: qty 1

## 2023-07-21 MED ORDER — METOPROLOL TARTRATE 5 MG/5ML IV SOLN: 5.0000 mg | INTRAVENOUS | Status: DC | PRN

## 2023-07-21 MED ORDER — LORAZEPAM 0.5 MG PO TABS
0.5000 mg | ORAL_TABLET | Freq: Three times a day (TID) | ORAL | Status: DC | PRN
  Administered 2023-07-21: 0.5 mg via ORAL
  Filled 2023-07-21: qty 1

## 2023-07-21 MED ORDER — HYDRALAZINE HCL 20 MG/ML IJ SOLN: 10.0000 mg | INTRAMUSCULAR | Status: DC | PRN

## 2023-07-21 MED ORDER — IPRATROPIUM-ALBUTEROL 0.5-2.5 (3) MG/3ML IN SOLN: 3.0000 mL | RESPIRATORY_TRACT | Status: DC | PRN

## 2023-07-21 NOTE — Evaluation (Signed)
 Physical Therapy Evaluation Patient Details Name: Tracy Mann MRN: [id number] DOB: 01-16-1934 Today's Date: 07/21/2023  History of Present Illness  89 y.o. female presents to [facility] hospital on 07/20/2023 with BLE swelling and redness. PMH includes afib, ischemic cardiomyopathy, HFrEF, HLD, PAH, venous stasis wounds, DMII, anxiety, CAD, CKD III, chronic pain syndrome.  Clinical Impression  Pt presents to PT with deficits in activity tolerance, power, endurance. Pt reports reduced endurance due to recent lack of mobility 2/2 LE pain from wounds. Pt takes multiple brief standing rest breaks during ambulation in the hallway. Pt will benefit from frequent mobilization in an effort to improve activity tolerance. No post-acute PT services appear necessary at this time.        If plan is discharge home, recommend the following: Assistance with cooking/housework;Assist for transportation   Can travel by private vehicle        Equipment Recommendations None recommended by PT  Recommendations for Other Services       Functional Status Assessment Patient has had a recent decline in their functional status and demonstrates the ability to make significant improvements in function in a reasonable and predictable amount of time.     Precautions / Restrictions Precautions Precautions: Fall Recall of Precautions/Restrictions: Intact Restrictions Weight Bearing Restrictions Per Provider Order: No      Mobility  Bed Mobility Overal bed mobility: Modified Independent                  Transfers Overall transfer level: Modified independent Equipment used: Rolling walker (2 wheels)                    Ambulation/Gait Ambulation/Gait assistance: Modified independent (Device/Increase time) Gait Distance (Feet): 200 Feet Assistive device: Rolling walker (2 wheels) Gait Pattern/deviations: Step-through pattern, Trunk flexed Gait velocity: reduced Gait velocity interpretation: <1.8  ft/sec, indicate of risk for recurrent falls   General Gait Details: intermittent trunk flexion over RW due to fatigue  Stairs            Wheelchair Mobility     Tilt Bed    Modified Rankin (Stroke Patients Only)       Balance Overall balance assessment: Needs assistance Sitting-balance support: No upper extremity supported, Feet supported Sitting balance-Leahy Scale: Good     Standing balance support: Single extremity supported, Reliant on assistive device for balance Standing balance-Leahy Scale: Poor                               Pertinent Vitals/Pain Pain Assessment Pain Assessment: Faces Faces Pain Scale: Hurts even more Pain Location: BLE Pain Descriptors / Indicators: Sore Pain Intervention(s): Monitored during session    Home Living Family/patient expects to be discharged to:: Private residence Living Arrangements: Alone Available Help at Discharge: Family;Friend(s);Available PRN/intermittently Type of Home: House Home Access: Stairs to enter Entrance Stairs-Rails: Can reach both Entrance Stairs-Number of Steps: 4   Home Layout: Multi-level;Able to live on main level with bedroom/bathroom Home Equipment: Rollator (4 wheels);Cane - single point;Grab bars - tub/shower;Other (comment) (upright walker)      Prior Function Prior Level of Function : Independent/Modified Independent;Driving             Mobility Comments: ambulatory with rollator typically, reports being less mobile in recent weeks due to LE pain       Extremity/Trunk Assessment   Upper Extremity Assessment Upper Extremity Assessment: Overall WFL for tasks assessed  Lower Extremity Assessment Lower Extremity Assessment: Generalized weakness    Cervical / Trunk Assessment Cervical / Trunk Assessment: Kyphotic  Communication   Communication Communication: Impaired Factors Affecting Communication: Hearing impaired    Cognition Arousal: Alert Behavior During  Therapy: WFL for tasks assessed/performed   PT - Cognitive impairments: No apparent impairments                         Following commands: Intact       Cueing Cueing Techniques: Verbal cues     General Comments General comments (skin integrity, edema, etc.): VSS on RA    Exercises     Assessment/Plan    PT Assessment Patient needs continued PT services  PT Problem List Decreased activity tolerance;Cardiopulmonary status limiting activity;Decreased mobility;Decreased strength       PT Treatment Interventions DME instruction;Gait training;Stair training;Functional mobility training;Balance training;Neuromuscular re-education;Patient/family education;Therapeutic activities;Therapeutic exercise    PT Goals (Current goals can be found in the Care Plan section)  Acute Rehab PT Goals Patient Stated Goal: to return home and improve activity tolerance PT Goal Formulation: With patient Time For Goal Achievement: 08/04/23 Potential to Achieve Goals: Good Additional Goals Additional Goal #1: Pt will score >19/24 on the DGI to indicate a reduced risk for falls    Frequency Min 1X/week     Co-evaluation               AM-PAC PT 6 Clicks Mobility  Outcome Measure Help needed turning from your back to your side while in a flat bed without using bedrails?: None Help needed moving from lying on your back to sitting on the side of a flat bed without using bedrails?: None Help needed moving to and from a bed to a chair (including a wheelchair)?: None Help needed standing up from a chair using your arms (e.g., wheelchair or bedside chair)?: None Help needed to walk in hospital room?: None Help needed climbing 3-5 steps with a railing? : A Little 6 Click Score: 23    End of Session Equipment Utilized During Treatment: Gait belt Activity Tolerance: Patient tolerated treatment well Patient left: in bed;with call bell/phone within reach Nurse Communication: Mobility  status PT Visit Diagnosis: Other abnormalities of gait and mobility (R26.89)    Time: 8394-8363 PT Time Calculation (min) (ACUTE ONLY): 31 min   Charges:   PT Evaluation $PT Eval Low Complexity: 1 Low   PT General Charges $$ ACUTE PT VISIT: 1 Visit         Bernardino JINNY Ruth, PT, DPT Acute Rehabilitation Office [phone]   Bernardino JINNY Ruth 07/21/2023, 6:18 PM

## 2023-07-21 NOTE — Progress Notes (Signed)
 PROGRESS NOTE    Tracy Mann  FMW:[id number] DOB: November 30, 1933 DOA: 07/20/2023 PCP: Bertrum Charlie CROME, MD    Brief Narrative:   89 year old with history of chronic A-fib on Eliquis , ischemic cardiomyopathy, CHF with reduced EF 35%, HLD, obesity, pulmonary HTN, chronic bilateral lower extremity wounds/venous stasis, DM2, anxiety, CAD, CKD 3B, chronic pain syndrome on opioids presented with swelling and redness of her bilateral lower extremity.  She has been dealing with this for about 1 month as outpatient treated with doxycycline  thereafter Augmentin  with minimal improvement.  Assessment & Plan:  Principal Problem:   Acute kidney injury superimposed on chronic kidney disease (HCC) Active Problems:   Chronic pain syndrome   Lower extremity cellulitis   Hyperlipidemia   Clinical depression   Anxiety, generalized   Hypothyroidism   Obstructive sleep apnea   Insulin  dependent type 2 diabetes mellitus (HCC)   Chronic atrial fibrillation (HCC)   Chronic venous insufficiency   CKD stage 3b, GFR 30-44 ml/min (HCC)   Essential hypertension   Bilateral lower leg cellulitis   Hyperkalemia   History of CAD (coronary artery disease)   Combined congestive systolic and diastolic heart failure (HCC)    Bilateral lower extremity cellulitis Chronic bilateral lower extremity venous insufficiency Persistent infection in the lower extremity despite of multiple rounds of outpatient antibiotics - Lower extremity Dopplers are negative for DVT - X-ray of the right hip/fib shows diffuse swelling but no gas formation -On IV vancomycin  and Unasyn   > linezolid , tentatively plan for 7 days -Wound care  Acute kidney injury superimposed on CKD stage IIIb -Elevated creatinine 2.69.  Previous baseline around 1.8 -Received gentle hydration.  Creatinine improving    Hyperkalemia Resolved   Hyperlipidemia -Continue ezetimibe .   Combined systolic and diastolic heart failure reduced EF 30 to  35% Essential hypertension Diuretics/lisinopril  on hold due to AKI.   Generalized anxiety disorder - Ativan  as needed   Hypothyroidism -Continue levothyroxine    History of obstructive sleep apnea Supportive care   Insulin -dependent DM type II Ending home insulin  verification Sliding scale   Chronic atrial fibrillation Not on rate control medication.  Continue Eliquis  twice daily   History of CAD Continue Eliquis  and ezetimibe  Currently chest pain-free   Chronic pain syndrome -Continue home Norco as needed Add bowel regimen   Reactive airway disease - Continue albuterol  as needed    DVT prophylaxis: Eliquis     Code Status: Full Code Family Communication:   Continue hospital stay for IV antibiotics    Subjective: No complaints still having bilateral lower extremity swelling and discomfort   Examination:  General exam: Appears calm and comfortable  Respiratory system: Clear to auscultation. Respiratory effort normal. Cardiovascular system: S1 & S2 heard, RRR. No JVD, murmurs, rubs, gallops or clicks. No pedal edema. Gastrointestinal system: Abdomen is nondistended, soft and nontender. No organomegaly or masses felt. Normal bowel sounds heard. Central nervous system: Alert and oriented. No focal neurological deficits. Extremities: Symmetric 5 x 5 power. Skin: Lateral lower extremity erythema and warmth noted Psychiatry: Judgement and insight appear normal. Mood & affect appropriate.                Diet Orders (From admission, onward)     Start     Ordered   07/20/23 2137  Diet heart healthy/carb modified Room service appropriate? Yes; Fluid consistency: Thin; Fluid restriction: 2000 mL Fluid  Diet effective now       Comments: Salt restriction 2 g/day  Question Answer Comment  Diet-HS Snack?  Nothing   Room service appropriate? Yes   Fluid consistency: Thin   Fluid restriction: 2000 mL Fluid      07/20/23 2136             Objective: Vitals:   07/20/23 2055 07/20/23 2337 07/21/23 0249 07/21/23 0906  BP:  (!) 108/48 (!) 126/59 (!) 101/51  Pulse:  (!) 58  73  Resp:  16 16 16   Temp:  97.7 F (36.5 C) 98.7 F (37.1 C)   TempSrc:      SpO2:  94% 93% 94%  Weight: 89.3 kg     Height: 5' 3 (1.6 m)       Intake/Output Summary (Last 24 hours) at 07/21/2023 1127 Last data filed at 07/21/2023 0746 Gross per 24 hour  Intake 807.5 ml  Output --  Net 807.5 ml   Filed Weights   07/20/23 2055  Weight: 89.3 kg    Scheduled Meds:  apixaban   2.5 mg Oral BID   ezetimibe   10 mg Oral Daily   fluticasone   2 spray Each Nare Daily   insulin  aspart  0-5 Units Subcutaneous QHS   insulin  aspart  0-6 Units Subcutaneous TID WC   levothyroxine   25 mcg Oral QAC breakfast   [START ON 07/22/2023] pantoprazole   40 mg Oral QAC breakfast   sodium chloride  flush  3 mL Intravenous Q12H   sodium chloride  flush  3 mL Intravenous Q12H   Continuous Infusions:  sodium chloride      lactated ringers  50 mL/hr at 07/21/23 0834    Nutritional status     Body mass index is 34.87 kg/m.  Data Reviewed:   CBC: Recent Labs  Lab 07/20/23 1546 07/21/23 0106  WBC 9.5 8.3  NEUTROABS 6.9  --   HGB 10.6* 10.5*  HCT 32.7* 33.1*  MCV 93.4 94.8  PLT 226 198   Basic Metabolic Panel: Recent Labs  Lab 07/20/23 1546 07/21/23 0106  NA 140 140  K 5.5* 4.9  CL 104 108  CO2 25 24  GLUCOSE 126* 100*  BUN 64* 57*  CREATININE 2.69* 2.27*  CALCIUM  9.7 9.1   GFR: Estimated Creatinine Clearance: 18.2 mL/min (A) (by C-G formula based on SCr of 2.27 mg/dL (H)). Liver Function Tests: Recent Labs  Lab 07/20/23 1546 07/21/23 0106  AST 24 21  ALT 9 12  ALKPHOS 80 57  BILITOT 0.4 0.7  PROT 7.2 6.3*  ALBUMIN 3.8 3.1*   Recent Labs  Lab 07/20/23 1546  LIPASE 32   No results for input(s): AMMONIA in the last 168 hours. Coagulation Profile: No results for input(s): INR, PROTIME in the last 168 hours. Cardiac  Enzymes: No results for input(s): CKTOTAL, CKMB, CKMBINDEX, TROPONINI in the last 168 hours. BNP (last 3 results) Recent Labs    07/20/23 1546  PROBNP 1,726.0*   HbA1C: Recent Labs    07/21/23 0106  HGBA1C 6.9*   CBG: Recent Labs  Lab 07/20/23 2107 07/21/23 0653  GLUCAP 103* 85   Lipid Profile: No results for input(s): CHOL, HDL, LDLCALC, TRIG, CHOLHDL, LDLDIRECT in the last 72 hours. Thyroid  Function Tests: No results for input(s): TSH, T4TOTAL, FREET4, T3FREE, THYROIDAB in the last 72 hours. Anemia Panel: No results for input(s): VITAMINB12, FOLATE, FERRITIN, TIBC, IRON, RETICCTPCT in the last 72 hours. Sepsis Labs: Recent Labs  Lab 07/20/23 1546  LATICACIDVEN 1.2    Recent Results (from the past 240 hours)  Blood culture (routine x 2)     Status: None (Preliminary result)  Collection Time: 07/20/23  3:36 PM   Specimen: BLOOD LEFT FOREARM  Result Value Ref Range Status   Specimen Description   Final    BLOOD LEFT FOREARM Performed at [facility], [address], [place], [place] [address]    Special Requests   Final    Blood Culture adequate volume BOTTLES DRAWN AEROBIC AND ANAEROBIC Performed at [facility], [address], [place], [place] [address]    Culture   Final    NO GROWTH < 24 HOURS Performed at [facility], [address] [place], [place] [address]    Report Status PENDING  Incomplete  Blood culture (routine x 2)     Status: None (Preliminary result)   Collection Time: 07/20/23  9:23 PM   Specimen: BLOOD  Result Value Ref Range Status   Specimen Description BLOOD SITE NOT SPECIFIED  Final   Special Requests   Final    BOTTLES DRAWN AEROBIC AND ANAEROBIC Blood Culture adequate volume   Culture   Final    NO GROWTH < 12 HOURS Performed at [facility], [address], [place], [place] [address]    Report Status PENDING  Incomplete         Radiology  Studies: US  Venous Img Lower Bilateral (DVT) Result Date: 07/20/2023 CLINICAL DATA:  Bilateral leg swelling, pain and redness for 1-2 months. EXAM: BILATERAL LOWER EXTREMITY VENOUS DOPPLER ULTRASOUND TECHNIQUE: Gray-scale sonography with graded compression, as well as color Doppler and duplex ultrasound were performed to evaluate the lower extremity deep venous systems from the level of the common femoral vein and including the common femoral, femoral, profunda femoral, popliteal and calf veins including the posterior tibial, peroneal and gastrocnemius veins when visible. The superficial great saphenous vein was also interrogated. Spectral Doppler was utilized to evaluate flow at rest and with distal augmentation maneuvers in the common femoral, femoral and popliteal veins. COMPARISON:  None Available. FINDINGS: RIGHT LOWER EXTREMITY Common Femoral Vein: No evidence of thrombus. Normal compressibility, respiratory phasicity and response to augmentation. Saphenofemoral Junction: No evidence of thrombus. Normal compressibility and flow on color Doppler imaging. Profunda Femoral Vein: No evidence of thrombus. Normal compressibility and flow on color Doppler imaging. Femoral Vein: No evidence of thrombus. Normal compressibility, respiratory phasicity and response to augmentation. Popliteal Vein: No evidence of thrombus. Normal compressibility, respiratory phasicity and response to augmentation. Calf Veins: No evidence of thrombus. Normal compressibility and flow on color Doppler imaging. Superficial Great Saphenous Vein: No evidence of thrombus. Normal compressibility. Venous Reflux:  None. Other Findings:  None. LEFT LOWER EXTREMITY Common Femoral Vein: No evidence of thrombus. Normal compressibility, respiratory phasicity and response to augmentation. Saphenofemoral Junction: No evidence of thrombus. Normal compressibility and flow on color Doppler imaging. Profunda Femoral Vein: No evidence of thrombus. Normal  compressibility and flow on color Doppler imaging. Femoral Vein: The LEFT femoral vein is limited in visualization. Popliteal Vein: No evidence of thrombus. Normal compressibility, respiratory phasicity and response to augmentation. Calf Veins: No evidence of thrombus. Normal compressibility and flow on color Doppler imaging. Superficial Great Saphenous Vein: No evidence of thrombus. Normal compressibility. Venous Reflux:  None. Other Findings:  None. IMPRESSION: Limited visualization of the LEFT femoral vein without evidence of deep venous thrombosis in either lower extremity. Electronically Signed   By: Suzen Dials M.D.   On: 07/20/2023 19:25   DG Tibia/Fibula Right Result Date: 07/20/2023 CLINICAL DATA:  Leg swelling and cellulitis. Evaluate for soft tissue gas. EXAM:  RIGHT TIBIA AND FIBULA - 2 VIEW COMPARISON:  None Available. FINDINGS: There is no evidence of fracture or other focal bone lesions. There is diffuse soft tissue swelling of the lower extremity. No definite soft tissue gas identified. Peripheral vascular calcifications are present. IMPRESSION: Diffuse soft tissue swelling of the lower extremity. No definite soft tissue gas identified. Electronically Signed   By: Greig Pique M.D.   On: 07/20/2023 18:46           LOS: 1 day   Time spent= 35 mins    Burgess JAYSON Dare, MD [facility]  If 7PM-7AM, please contact night-coverage  07/21/2023, 11:27 AM

## 2023-07-21 NOTE — Consult Note (Addendum)
 WOC Nurse Consult Note: Reason for Consult: requested to assess righ ulcer on leg. Performed remotely after evaluate photos and notes. Wound type: Venous ulcer. Pressure Injury POA: Yes/No/NA Measurement: aprox. 6 cm x 4.5 cm Wound bed: 100% dark red. Drainage (amount, consistency, odor)  Periwound: sweeling legs, telangiectases, dark redness around the ulcer. Obs: Pt can be benefit with ABI exam to apply unna boot. Dressing procedure/placement/frequency: Clean with Vashe (#[id number]), pat dry the intact skin. Moisture the leg with barrier cream. Apply Alginate (#[id number]) on the wound bed and a second layer with gauze to absorb the moisture. Wrap the leg with Kerlix and ACE wrap, starting at the base of the toes, until the knee. Apply a light compress beginning to the ankle to the knee. Change every 3 days or PRN.  Left leg with no wound: Wrap the leg with Kerlix and ACE wrap, starting at the base of the toes, until the knee. Apply a light compress beginning to the ankle to the knee. Change every 3 days or PRN.  - Maintain leg elevation. - The venous insufficiency is on both legs. If the patient has a compressive sock, use on the left leg. If not, wrap the leg with no open wound as above.  WOC team will not plan to follow further.  Please reconsult if further assistance is needed. Thank-you,  Lela Holm BSN, RN, [employer], WOC  (Pager: [phone])

## 2023-07-21 NOTE — Hospital Course (Addendum)
 Brief Narrative:   89 year old with history of chronic A-fib on Eliquis , ischemic cardiomyopathy, CHF with reduced EF 35%, HLD, obesity, pulmonary HTN, chronic bilateral lower extremity wounds/venous stasis, DM2, anxiety, CAD, CKD 3B, chronic pain syndrome on opioids presented with swelling and redness of her bilateral lower extremity.  She has been dealing with this for about 1 month as outpatient treated with doxycycline  thereafter Augmentin  with minimal improvement.  Upon admission patient was started on vancomycin  and Unasyn  and eventually changed to linezolid .  She is doing significantly well therefore we will discharge her in stable condition.  Assessment & Plan:  Principal Problem:   Acute kidney injury superimposed on chronic kidney disease (HCC) Active Problems:   Chronic pain syndrome   Lower extremity cellulitis   Hyperlipidemia   Clinical depression   Anxiety, generalized   Hypothyroidism   Obstructive sleep apnea   Insulin  dependent type 2 diabetes mellitus (HCC)   Chronic atrial fibrillation (HCC)   Chronic venous insufficiency   CKD stage 3b, GFR 30-44 ml/min (HCC)   Essential hypertension   Bilateral lower leg cellulitis   Hyperkalemia   History of CAD (coronary artery disease)   Combined congestive systolic and diastolic heart failure (HCC)    Bilateral lower extremity cellulitis Chronic bilateral lower extremity venous insufficiency Persistent infection in the lower extremity despite of multiple rounds of outpatient antibiotics.  Lower extremity Dopplers is negative.  Doing significantly well today therefore we will transition to p.o. linezolid  for 5 more days.  Need to follow-up outpatient.  Acute kidney injury superimposed on CKD stage IIIb Hyperkalemia, mild -Elevated creatinine 2.69.  Previous baseline around 1.8 - Function is now back to baseline    Hyperlipidemia -Continue ezetimibe .   Combined systolic and diastolic heart failure reduced EF 30 to  35% Essential hypertension, stable Home regimen   Generalized anxiety disorder - Ativan  as needed   Hypothyroidism -Continue levothyroxine    History of obstructive sleep apnea Supportive care   Insulin -dependent DM type II Ending home insulin  verification Sliding scale   Chronic atrial fibrillation Not on rate control medication.  Continue Eliquis  twice daily   History of CAD Continue Eliquis  and ezetimibe  Currently chest pain-free   Chronic pain syndrome -Continue home Norco as needed Add bowel regimen   Reactive airway disease - Continue albuterol  as needed    DVT prophylaxis: Eliquis     Code Status: Full Code Family Communication:   Discharge today  Subjective: Feeling well with no complaints Wishing to go home  Examination:  General exam: Appears calm and comfortable  Respiratory system: Clear to auscultation. Respiratory effort normal. Cardiovascular system: S1 & S2 heard, RRR. No JVD, murmurs, rubs, gallops or clicks. No pedal edema. Gastrointestinal system: Abdomen is nondistended, soft and nontender. No organomegaly or masses felt. Normal bowel sounds heard. Central nervous system: Alert and oriented. No focal neurological deficits. Extremities: Symmetric 5 x 5 power. Skin: Lateral lower extremity erythema and warmth noted, significantly improved today Psychiatry: Judgement and insight appear normal. Mood & affect appropriate.

## 2023-07-22 DIAGNOSIS — N189 Chronic kidney disease, unspecified: Secondary | ICD-10-CM | POA: Diagnosis not present

## 2023-07-22 DIAGNOSIS — N179 Acute kidney failure, unspecified: Secondary | ICD-10-CM | POA: Diagnosis not present

## 2023-07-22 LAB — CBC
HCT: 33.6 % — ABNORMAL LOW (ref 36.0–46.0)
Hemoglobin: 10.5 g/dL — ABNORMAL LOW (ref 12.0–15.0)
MCH: 30.2 pg (ref 26.0–34.0)
MCHC: 31.3 g/dL (ref 30.0–36.0)
MCV: 96.6 fL (ref 80.0–100.0)
Platelets: 192 10*3/uL (ref 150–400)
RBC: 3.48 MIL/uL — ABNORMAL LOW (ref 3.87–5.11)
RDW: 13.3 % (ref 11.5–15.5)
WBC: 8.9 10*3/uL (ref 4.0–10.5)
nRBC: 0 % (ref 0.0–0.2)

## 2023-07-22 LAB — PHOSPHORUS: Phosphorus: 3.6 mg/dL (ref 2.5–4.6)

## 2023-07-22 LAB — BASIC METABOLIC PANEL WITH GFR
Anion gap: 5 (ref 5–15)
BUN: 44 mg/dL — ABNORMAL HIGH (ref 8–23)
CO2: 21 mmol/L — ABNORMAL LOW (ref 22–32)
Calcium: 8.6 mg/dL — ABNORMAL LOW (ref 8.9–10.3)
Chloride: 111 mmol/L (ref 98–111)
Creatinine, Ser: 2.03 mg/dL — ABNORMAL HIGH (ref 0.44–1.00)
GFR, Estimated: 23 mL/min — ABNORMAL LOW (ref >60)
Glucose, Bld: 129 mg/dL — ABNORMAL HIGH (ref 70–99)
Potassium: 5.3 mmol/L — ABNORMAL HIGH (ref 3.5–5.1)
Sodium: 137 mmol/L (ref 135–145)

## 2023-07-22 LAB — MAGNESIUM: Magnesium: 2.3 mg/dL (ref 1.7–2.4)

## 2023-07-22 LAB — GLUCOSE, CAPILLARY: Glucose-Capillary: 126 mg/dL — ABNORMAL HIGH (ref 70–99)

## 2023-07-22 MED ORDER — LINEZOLID 600 MG PO TABS: 600.0000 mg | ORAL_TABLET | Freq: Two times a day (BID) | ORAL | 0 refills | Status: AC

## 2023-07-22 MED ORDER — SODIUM ZIRCONIUM CYCLOSILICATE 10 G PO PACK
10.0000 g | PACK | ORAL | Status: DC
  Administered 2023-07-22: 10 g via ORAL
  Filled 2023-07-22: qty 1

## 2023-07-22 NOTE — Evaluation (Signed)
 Occupational Therapy Evaluation Patient Details Name: Tracy Mann MRN: [id number] DOB: [birth year] Today's Date: 07/22/2023   History of Present Illness   89 y.o. female [facility] to [facility] hospital on 07/20/2023 with BLE swelling and redness. PMH includes afib, ischemic cardiomyopathy, HFrEF, HLD, PAH, venous stasis wounds, DMII, anxiety, CAD, CKD III, chronic pain syndrome.     Clinical Impressions Pt admitted for above, PTA pt reports being ind with ADLs and ambulating mod I with rollator at home. Pt currently presenting with LLE pain in the hip region that shoots into her back. Pt had extensive questions about possible post acute rehab and concerns of cause of pain, discussed possibilities with patient but ensured her that nothing can be certain without imaging and MD dx, thorough discussion about follow-up with primary MD to further assess concerns about LLE pain. Pt overall close to baseline and completing ADLs/mobility at mod I level. Pt has no further acute skilled OT needs, no post acute OT recommended.      If plan is discharge home, recommend the following:   Other (comment) (prn)     Functional Status Assessment   Patient has not had a recent decline in their functional status     Equipment Recommendations   None recommended by OT     Recommendations for Other Services         Precautions/Restrictions   Precautions Precautions: Fall Recall of Precautions/Restrictions: Intact Restrictions Weight Bearing Restrictions Per Provider Order: No     Mobility Bed Mobility Overal bed mobility: Modified Independent                  Transfers Overall transfer level: Modified independent Equipment used: None                      Balance Overall balance assessment: Needs assistance Sitting-balance support: No upper extremity supported, Feet supported Sitting balance-Leahy Scale: Good       Standing balance-Leahy Scale: Fair                              ADL either performed or assessed with clinical judgement   ADL Overall ADL's : Modified independent                                       General ADL Comments: Pt functioning at Mod I level. Already dressed upon arrival and reports doing it independently. Pt c/o shooting pain from L hip region to back, also reported hx of spinal stenosis. Had extensive discussion with pt regarding uncertainty of cause of pain, possible from cellutlitis. Suggested follow-up MRI if necessary pending antibiotics course. Also discussed post acute PT and aquatic therapy post DC.     Vision   Vision Assessment?: No apparent visual deficits     Perception         Praxis         Pertinent Vitals/Pain Pain Assessment Pain Assessment: Faces Faces Pain Scale: Hurts even more Pain Location: L hip to low back Pain Descriptors / Indicators: Aching, Shooting Pain Intervention(s): Monitored during session, Limited activity within patient's tolerance     Extremity/Trunk Assessment Upper Extremity Assessment Upper Extremity Assessment: Overall WFL for tasks assessed   Lower Extremity Assessment Lower Extremity Assessment: Generalized weakness       Communication Communication Communication: Impaired Factors Affecting Communication: Hearing  impaired   Cognition Arousal: Alert Behavior During Therapy: WFL for tasks assessed/performed Cognition: No apparent impairments                               Following commands: Intact       Cueing  General Comments   Cueing Techniques: Verbal cues  VSS   Exercises     Shoulder Instructions      Home Living Family/patient expects to be discharged to:: Private residence Living Arrangements: Alone Available Help at Discharge: Family;Friend(s);Available PRN/intermittently Type of Home: House Home Access: Stairs to enter [employer] of Steps: 4 Entrance Stairs-Rails: Can reach  both Home Layout: Multi-level;Able to live on main level with bedroom/bathroom     Bathroom Shower/Tub: Chief Strategy Officer: Standard     Home Equipment: Rollator (4 wheels);Cane - single point;Grab bars - tub/shower;Other (comment) (upright walker)          Prior Functioning/Environment Prior Level of Function : Independent/Modified Independent;Driving             Mobility Comments: ambulatory with rollator typically, reports being less mobile in recent weeks due to LE pain ADLs Comments: ind    OT Problem List: Increased edema;Pain (BLE edema)   OT Treatment/Interventions:        OT Goals(Current goals can be found in the care plan section)   Acute Rehab OT Goals Patient Stated Goal: To go home OT Goal Formulation: With patient Time For Goal Achievement: 08/05/23 Potential to Achieve Goals: Good   OT Frequency:       Co-evaluation              AM-PAC OT 6 Clicks Daily Activity     Outcome Measure Help from another person eating meals?: None Help from another person taking care of personal grooming?: None Help from another person toileting, which includes using toliet, bedpan, or urinal?: None Help from another person bathing (including washing, rinsing, drying)?: None Help from another person to put on and taking off regular upper body clothing?: None Help from another person to put on and taking off regular lower body clothing?: None 6 Click Score: 24   End of Session Equipment Utilized During Treatment: Gait belt;Rolling walker (2 wheels) Nurse Communication: Mobility status  Activity Tolerance: Patient tolerated treatment well Patient left: in bed;with call bell/phone within reach  OT Visit Diagnosis: Pain Pain - Right/Left: Left Pain - part of body: Hip                Time: 8785-8761 OT Time Calculation (min): 24 min Charges:  OT General Charges $OT Visit: 1 Visit OT Evaluation $OT Eval Low Complexity: 1 Low OT  Treatments $Therapeutic Activity: 8-22 mins  07/22/2023  AB, OTR/L  Acute Rehabilitation Services  Office: [phone]   [name] 07/22/2023, 1:41 PM

## 2023-07-22 NOTE — Plan of Care (Signed)

## 2023-07-22 NOTE — Progress Notes (Signed)
 PROGRESS NOTE    Tracy Mann  FMW:[id number] DOB: 02-13-1933 DOA: 07/20/2023 PCP: Bertrum Charlie CROME, MD    Brief Narrative:   89 year old with history of chronic A-fib on Eliquis , ischemic cardiomyopathy, CHF with reduced EF 35%, HLD, obesity, pulmonary HTN, chronic bilateral lower extremity wounds/venous stasis, DM2, anxiety, CAD, CKD 3B, chronic pain syndrome on opioids presented with swelling and redness of her bilateral lower extremity.  She has been dealing with this for about 1 month as outpatient treated with doxycycline  thereafter Augmentin  with minimal improvement.  Upon admission patient was started on vancomycin  and Unasyn  and eventually changed to linezolid .  She is doing significantly well therefore we will discharge her in stable condition.  Assessment & Plan:  Principal Problem:   Acute kidney injury superimposed on chronic kidney disease (HCC) Active Problems:   Chronic pain syndrome   Lower extremity cellulitis   Hyperlipidemia   Clinical depression   Anxiety, generalized   Hypothyroidism   Obstructive sleep apnea   Insulin  dependent type 2 diabetes mellitus (HCC)   Chronic atrial fibrillation (HCC)   Chronic venous insufficiency   CKD stage 3b, GFR 30-44 ml/min (HCC)   Essential hypertension   Bilateral lower leg cellulitis   Hyperkalemia   History of CAD (coronary artery disease)   Combined congestive systolic and diastolic heart failure (HCC)    Bilateral lower extremity cellulitis Chronic bilateral lower extremity venous insufficiency Persistent infection in the lower extremity despite of multiple rounds of outpatient antibiotics.  Lower extremity Dopplers is negative.  Doing significantly well today therefore we will transition to p.o. linezolid  for 5 more days.  Need to follow-up outpatient.  Acute kidney injury superimposed on CKD stage IIIb Hyperkalemia, mild -Elevated creatinine 2.69.  Previous baseline around 1.8 - Function is now back to  baseline    Hyperlipidemia -Continue ezetimibe .   Combined systolic and diastolic heart failure reduced EF 30 to 35% Essential hypertension, stable Home regimen   Generalized anxiety disorder - Ativan  as needed   Hypothyroidism -Continue levothyroxine    History of obstructive sleep apnea Supportive care   Insulin -dependent DM type II Ending home insulin  verification Sliding scale   Chronic atrial fibrillation Not on rate control medication.  Continue Eliquis  twice daily   History of CAD Continue Eliquis  and ezetimibe  Currently chest pain-free   Chronic pain syndrome -Continue home Norco as needed Add bowel regimen   Reactive airway disease - Continue albuterol  as needed    DVT prophylaxis: Eliquis     Code Status: Full Code Family Communication:   Discharge today  Subjective: Feeling well with no complaints Wishing to go home  Examination:  General exam: Appears calm and comfortable  Respiratory system: Clear to auscultation. Respiratory effort normal. Cardiovascular system: S1 & S2 heard, RRR. No JVD, murmurs, rubs, gallops or clicks. No pedal edema. Gastrointestinal system: Abdomen is nondistended, soft and nontender. No organomegaly or masses felt. Normal bowel sounds heard. Central nervous system: Alert and oriented. No focal neurological deficits. Extremities: Symmetric 5 x 5 power. Skin: Lateral lower extremity erythema and warmth noted, significantly improved today Psychiatry: Judgement and insight appear normal. Mood & affect appropriate.                Diet Orders (From admission, onward)     Start     Ordered   07/20/23 2137  Diet heart healthy/carb modified Room service appropriate? Yes; Fluid consistency: Thin; Fluid restriction: 2000 mL Fluid  Diet effective now       Comments:  Salt restriction 2 g/day  Question Answer Comment  Diet-HS Snack? Nothing   Room service appropriate? Yes   Fluid consistency: Thin   Fluid  restriction: 2000 mL Fluid      07/20/23 2136            Objective: Vitals:   07/21/23 2100 07/22/23 0449 07/22/23 0800 07/22/23 0824  BP: 116/72 118/74 (!) 127/49 (!) 105/57  Pulse: 95 79 82 80  Resp: 16 18 20    Temp: 98 F (36.7 C) 97.8 F (36.6 C) 97.6 F (36.4 C) 98.3 F (36.8 C)  TempSrc: Oral Oral Oral   SpO2: 96% 97% 92%   Weight:      Height:        Intake/Output Summary (Last 24 hours) at 07/22/2023 1041 Last data filed at 07/22/2023 0047 Gross per 24 hour  Intake 335 ml  Output --  Net 335 ml   Filed Weights   07/20/23 2055  Weight: 89.3 kg    Scheduled Meds:  apixaban   2.5 mg Oral BID   ezetimibe   10 mg Oral Daily   fluticasone   2 spray Each Nare Daily   insulin  aspart  0-5 Units Subcutaneous QHS   insulin  aspart  0-6 Units Subcutaneous TID WC   levothyroxine   25 mcg Oral QAC breakfast   linezolid   600 mg Oral Q12H   pantoprazole   40 mg Oral QAC breakfast   sodium chloride  flush  3 mL Intravenous Q12H   sodium chloride  flush  3 mL Intravenous Q12H   sodium zirconium cyclosilicate   10 g Oral Q4H   Continuous Infusions:  Nutritional status     Body mass index is 34.87 kg/m.  Data Reviewed:   CBC: Recent Labs  Lab 07/20/23 1546 07/21/23 0106 07/22/23 0627  WBC 9.5 8.3 8.9  NEUTROABS 6.9  --   --   HGB 10.6* 10.5* 10.5*  HCT 32.7* 33.1* 33.6*  MCV 93.4 94.8 96.6  PLT 226 198 192   Basic Metabolic Panel: Recent Labs  Lab 07/20/23 1546 07/21/23 0106 07/22/23 0627  NA 140 140 137  K 5.5* 4.9 5.3*  CL 104 108 111  CO2 25 24 21*  GLUCOSE 126* 100* 129*  BUN 64* 57* 44*  CREATININE 2.69* 2.27* 2.03*  CALCIUM  9.7 9.1 8.6*  MG  --   --  2.3  PHOS  --   --  3.6   GFR: Estimated Creatinine Clearance: 20.3 mL/min (A) (by C-G formula based on SCr of 2.03 mg/dL (H)). Liver Function Tests: Recent Labs  Lab 07/20/23 1546 07/21/23 0106  AST 24 21  ALT 9 12  ALKPHOS 80 57  BILITOT 0.4 0.7  PROT 7.2 6.3*  ALBUMIN 3.8 3.1*    Recent Labs  Lab 07/20/23 1546  LIPASE 32   No results for input(s): AMMONIA in the last 168 hours. Coagulation Profile: No results for input(s): INR, PROTIME in the last 168 hours. Cardiac Enzymes: No results for input(s): CKTOTAL, CKMB, CKMBINDEX, TROPONINI in the last 168 hours. BNP (last 3 results) Recent Labs    07/20/23 1546  PROBNP 1,726.0*   HbA1C: Recent Labs    07/21/23 0106  HGBA1C 6.9*   CBG: Recent Labs  Lab 07/21/23 1140 07/21/23 1218 07/21/23 1614 07/21/23 2119 07/22/23 0617  GLUCAP 118* 102* 109* 132* 126*   Lipid Profile: No results for input(s): CHOL, HDL, LDLCALC, TRIG, CHOLHDL, LDLDIRECT in the last 72 hours. Thyroid  Function Tests: No results for input(s): TSH, T4TOTAL, FREET4, T3FREE, THYROIDAB in  the last 72 hours. Anemia Panel: No results for input(s): VITAMINB12, FOLATE, FERRITIN, TIBC, IRON, RETICCTPCT in the last 72 hours. Sepsis Labs: Recent Labs  Lab 07/20/23 1546  LATICACIDVEN 1.2    Recent Results (from the past 240 hours)  Blood culture (routine x 2)     Status: None (Preliminary result)   Collection Time: 07/20/23  3:36 PM   Specimen: BLOOD LEFT FOREARM  Result Value Ref Range Status   Specimen Description   Final    BLOOD LEFT FOREARM Performed at [facility], [address], [place], [place] [address]    Special Requests   Final    Blood Culture adequate volume BOTTLES DRAWN AEROBIC AND ANAEROBIC Performed at [facility], [address], [place], [place] [address]    Culture   Final    NO GROWTH 2 DAYS Performed at [facility], [address] [place], [place] [address]    Report Status PENDING  Incomplete  Blood culture (routine x 2)     Status: None (Preliminary result)   Collection Time: 07/20/23  9:23 PM   Specimen: BLOOD  Result Value Ref Range Status   Specimen Description BLOOD SITE NOT SPECIFIED  Final   Special Requests    Final    BOTTLES DRAWN AEROBIC AND ANAEROBIC Blood Culture adequate volume   Culture   Final    NO GROWTH 2 DAYS Performed at [facility], [address] [place], [place] [address]    Report Status PENDING  Incomplete         Radiology Studies: US  Venous Img Lower Bilateral (DVT) Result Date: 07/20/2023 CLINICAL DATA:  Bilateral leg swelling, pain and redness for 1-2 months. EXAM: BILATERAL LOWER EXTREMITY VENOUS DOPPLER ULTRASOUND TECHNIQUE: Gray-scale sonography with graded compression, as well as color Doppler and duplex ultrasound were performed to evaluate the lower extremity deep venous systems from the level of the common femoral vein and including the common femoral, femoral, profunda femoral, popliteal and calf veins including the posterior tibial, peroneal and gastrocnemius veins when visible. The superficial great saphenous vein was also interrogated. Spectral Doppler was utilized to evaluate flow at rest and with distal augmentation maneuvers in the common femoral, femoral and popliteal veins. COMPARISON:  None Available. FINDINGS: RIGHT LOWER EXTREMITY Common Femoral Vein: No evidence of thrombus. Normal compressibility, respiratory phasicity and response to augmentation. Saphenofemoral Junction: No evidence of thrombus. Normal compressibility and flow on color Doppler imaging. Profunda Femoral Vein: No evidence of thrombus. Normal compressibility and flow on color Doppler imaging. Femoral Vein: No evidence of thrombus. Normal compressibility, respiratory phasicity and response to augmentation. Popliteal Vein: No evidence of thrombus. Normal compressibility, respiratory phasicity and response to augmentation. Calf Veins: No evidence of thrombus. Normal compressibility and flow on color Doppler imaging. Superficial Great Saphenous Vein: No evidence of thrombus. Normal compressibility. Venous Reflux:  None. Other Findings:  None. LEFT LOWER EXTREMITY Common Femoral Vein: No  evidence of thrombus. Normal compressibility, respiratory phasicity and response to augmentation. Saphenofemoral Junction: No evidence of thrombus. Normal compressibility and flow on color Doppler imaging. Profunda Femoral Vein: No evidence of thrombus. Normal compressibility and flow on color Doppler imaging. Femoral Vein: The LEFT femoral vein is limited in visualization. Popliteal Vein: No evidence of thrombus. Normal compressibility, respiratory phasicity and response to augmentation. Calf Veins: No evidence of thrombus. Normal compressibility and flow on color Doppler imaging. Superficial Great Saphenous Vein: No evidence of thrombus. Normal compressibility. Venous Reflux:  None. Other  Findings:  None. IMPRESSION: Limited visualization of the LEFT femoral vein without evidence of deep venous thrombosis in either lower extremity. Electronically Signed   By: Suzen Dials M.D.   On: 07/20/2023 19:25   DG Tibia/Fibula Right Result Date: 07/20/2023 CLINICAL DATA:  Leg swelling and cellulitis. Evaluate for soft tissue gas. EXAM: RIGHT TIBIA AND FIBULA - 2 VIEW COMPARISON:  None Available. FINDINGS: There is no evidence of fracture or other focal bone lesions. There is diffuse soft tissue swelling of the lower extremity. No definite soft tissue gas identified. Peripheral vascular calcifications are present. IMPRESSION: Diffuse soft tissue swelling of the lower extremity. No definite soft tissue gas identified. Electronically Signed   By: Greig Pique M.D.   On: 07/20/2023 18:46           LOS: 2 days   Time spent= 35 mins    Burgess JAYSON Dare, MD [facility]  If 7PM-7AM, please contact night-coverage  07/22/2023, 10:41 AM

## 2023-07-22 NOTE — Discharge Summary (Signed)
 Physician Discharge Summary  Tracy Mann FMW:[id number] DOB: [birth year] DOA: 07/20/2023  PCP: Bertrum Charlie CROME, MD  Admit date: 07/20/2023 Discharge date: 07/22/2023  Admitted From: Home Disposition:  Home  Recommendations for Outpatient Follow-up:  Follow up with PCP in 1-2 weeks Please obtain BMP/CBC in one week your next doctors visit.  Linezolid  x 5 days   Discharge Condition: Stable CODE STATUS: Full Diet recommendation: diabetic  Brief/Interim Summary: Brief Narrative:   89 year old with history of chronic A-fib on Eliquis , ischemic cardiomyopathy, CHF with reduced EF 35%, HLD, obesity, pulmonary HTN, chronic bilateral lower extremity wounds/venous stasis, DM2, anxiety, CAD, CKD 3B, chronic pain syndrome on opioids presented with swelling and redness of her bilateral lower extremity.  She has been dealing with this for about 1 month as outpatient treated with doxycycline  thereafter Augmentin  with minimal improvement.  Upon admission patient was started on vancomycin  and Unasyn  and eventually changed to linezolid .  She is doing significantly well therefore we will discharge her in stable condition.  Assessment & Plan:  Principal Problem:   Acute kidney injury superimposed on chronic kidney disease (HCC) Active Problems:   Chronic pain syndrome   Lower extremity cellulitis   Hyperlipidemia   Clinical depression   Anxiety, generalized   Hypothyroidism   Obstructive sleep apnea   Insulin  dependent type 2 diabetes mellitus (HCC)   Chronic atrial fibrillation (HCC)   Chronic venous insufficiency   CKD stage 3b, GFR 30-44 ml/min (HCC)   Essential hypertension   Bilateral lower leg cellulitis   Hyperkalemia   History of CAD (coronary artery disease)   Combined congestive systolic and diastolic heart failure (HCC)    Bilateral lower extremity cellulitis Chronic bilateral lower extremity venous insufficiency Persistent infection in the lower extremity despite of  multiple rounds of outpatient antibiotics.  Lower extremity Dopplers is negative.  Doing significantly well today therefore we will transition to p.o. linezolid  for 5 more days.  Need to follow-up outpatient.  Acute kidney injury superimposed on CKD stage IIIb Hyperkalemia, mild -Elevated creatinine 2.69.  Previous baseline around 1.8 - Function is now back to baseline    Hyperlipidemia -Continue ezetimibe .   Combined systolic and diastolic heart failure reduced EF 30 to 35% Essential hypertension, stable Home regimen   Generalized anxiety disorder - Ativan  as needed   Hypothyroidism -Continue levothyroxine    History of obstructive sleep apnea Supportive care   Insulin -dependent DM type II Ending home insulin  verification Sliding scale   Chronic atrial fibrillation Not on rate control medication.  Continue Eliquis  twice daily   History of CAD Continue Eliquis  and ezetimibe  Currently chest pain-free   Chronic pain syndrome -Continue home Norco as needed Add bowel regimen   Reactive airway disease - Continue albuterol  as needed    DVT prophylaxis: Eliquis     Code Status: Full Code Family Communication:   Discharge today  Subjective: Feeling well with no complaints Wishing to go home  Examination:  General exam: Appears calm and comfortable  Respiratory system: Clear to auscultation. Respiratory effort normal. Cardiovascular system: S1 & S2 heard, RRR. No JVD, murmurs, rubs, gallops or clicks. No pedal edema. Gastrointestinal system: Abdomen is nondistended, soft and nontender. No organomegaly or masses felt. Normal bowel sounds heard. Central nervous system: Alert and oriented. No focal neurological deficits. Extremities: Symmetric 5 x 5 power. Skin: Lateral lower extremity erythema and warmth noted, significantly improved today Psychiatry: Judgement and insight appear normal. Mood & affect appropriate.    Discharge Diagnoses:  Principal Problem:  Acute kidney injury superimposed on chronic kidney disease (HCC) Active Problems:   Chronic pain syndrome   Lower extremity cellulitis   Hyperlipidemia   Clinical depression   Anxiety, generalized   Hypothyroidism   Obstructive sleep apnea   Insulin  dependent type 2 diabetes mellitus (HCC)   Chronic atrial fibrillation (HCC)   Chronic venous insufficiency   CKD stage 3b, GFR 30-44 ml/min (HCC)   Essential hypertension   Bilateral lower leg cellulitis   Hyperkalemia   History of CAD (coronary artery disease)   Combined congestive systolic and diastolic heart failure (HCC)      Discharge Exam: Vitals:   07/22/23 0800 07/22/23 0824  BP: (!) 127/49 (!) 105/57  Pulse: 82 80  Resp: 20   Temp: 97.6 F (36.4 C) 98.3 F (36.8 C)  SpO2: 92%    Vitals:   07/21/23 2100 07/22/23 0449 07/22/23 0800 07/22/23 0824  BP: 116/72 118/74 (!) 127/49 (!) 105/57  Pulse: 95 79 82 80  Resp: 16 18 20    Temp: 98 F (36.7 C) 97.8 F (36.6 C) 97.6 F (36.4 C) 98.3 F (36.8 C)  TempSrc: Oral Oral Oral   SpO2: 96% 97% 92%   Weight:      Height:          Discharge Instructions   Allergies as of 07/22/2023       Reactions   Clindamycin  Shortness Of Breath, Swelling   Clindamycin /lincomycin Shortness Of Breath, Swelling   Duloxetine  Other (See Comments)   Same as Paroxetine    Paroxetine  Other (See Comments)   Horrible headache, Stomach pain, Diarrhea   Sulfa Antibiotics Anaphylaxis   Cephalexin  Other (See Comments)   Blisters   Clarithromycin Other (See Comments)   Hives, headaches, hard time swelling, felt like throat was closing up Hives, headaches, hard time swelling, felt like throat was closing up    Hives, headaches, hard time swelling, felt like throat was closing up Hives, headaches, hard time swelling, felt like throat was closing up   Diphenhydramine Other (See Comments)   Estrogens Conjugated    Estrogens Conjugated    Nitrofurantoin  Nausea Only         Medication List     STOP taking these medications    amoxicillin -clavulanate 875-125 MG tablet Commonly known as: AUGMENTIN        TAKE these medications    albuterol  108 (90 Base) MCG/ACT inhaler Commonly known as: VENTOLIN  HFA TAKE 2 PUFFS BY MOUTH EVERY 6 HOURS AS NEEDED FOR WHEEZE OR SHORTNESS OF BREATH What changed: See the new instructions.   apixaban  2.5 MG Tabs tablet Commonly known as: Eliquis  Take 1 tablet (2.5 mg total) by mouth 2 (two) times daily.   CHOLECALCIFEROL PO Take 1 tablet by mouth daily. Dosage unknown   ezetimibe  10 MG tablet Commonly known as: ZETIA  Take 1 tablet (10 mg total) by mouth daily.   flavoxATE 100 MG tablet Commonly known as: URISPAS Take 1 tablet (100 mg total) by mouth 2 (two) times daily as needed for bladder spasms.   fluticasone  50 MCG/ACT nasal spray Commonly known as: FLONASE  Place 2 sprays into both nostrils daily.   HYDROcodone -acetaminophen  10-325 MG tablet Commonly known as: Norco Take 1 tablet by mouth every 8 (eight) hours as needed for severe pain (pain score 7-10). Must last 30 days.   hydrocortisone  cream 0.5 % Apply 1 Application topically 2 (two) times daily.   insulin  lispro 100 UNIT/ML KwikPen Commonly known as: HUMALOG  Inject 10-25 Units into the skin  in the morning, at noon, and at bedtime.   Lantus  SoloStar 100 UNIT/ML Solostar Pen Generic drug: insulin  glargine Inject 18-24 Units into the skin at bedtime.   levothyroxine  25 MCG tablet Commonly known as: SYNTHROID  Take 25 mcg by mouth daily before breakfast.   linezolid  600 MG tablet Commonly known as: ZYVOX  Take 1 tablet (600 mg total) by mouth 2 (two) times daily for 5 days.   lisinopril  10 MG tablet Commonly known as: ZESTRIL  Take 1 tablet (10 mg total) by mouth daily.   LORazepam  0.5 MG tablet Commonly known as: ATIVAN  Take 0.5 mg by mouth every 8 (eight) hours as needed for anxiety.   Magnesium  500 MG Caps Take 1,000 mg by mouth  daily.   naloxone  4 MG/0.1ML Liqd nasal spray kit Commonly known as: NARCAN  Place 1 spray into the nose as needed for up to 365 doses (for opioid-induced respiratory depresssion). In case of emergency (overdose), spray once into each nostril. If no response within 3 minutes, repeat application and call 911.   nitroGLYCERIN  0.4 MG SL tablet Commonly known as: NITROSTAT  Place 1 tablet (0.4 mg total) under the tongue every 5 (five) minutes as needed for chest pain.   ondansetron  4 MG tablet Commonly known as: Zofran  Take 1 tablet (4 mg total) by mouth every 12 (twelve) hours as needed for nausea or vomiting.   RELION PEN NEEDLE 31G/8MM 31G X 8 MM Misc Generic drug: Insulin  Pen Needle Use with insulin  pen three times daily   torsemide  20 MG tablet Commonly known as: DEMADEX  Take 2 tablets (40 mg total) by mouth daily.   trimethoprim  100 MG tablet Commonly known as: TRIMPEX  Take 100 mg by mouth daily.        Follow-up Information     Bertrum Charlie CROME, MD Follow up in 1 week(s).   Specialty: Family Medicine Contact information: [address] [place] [address] [phone]                Allergies  Allergen Reactions   Clindamycin  Shortness Of Breath and Swelling   Clindamycin /Lincomycin Shortness Of Breath and Swelling   Duloxetine  Other (See Comments)    Same as Paroxetine    Paroxetine  Other (See Comments)    Horrible headache, Stomach pain, Diarrhea   Sulfa Antibiotics Anaphylaxis   Cephalexin  Other (See Comments)    Blisters   Clarithromycin Other (See Comments)    Hives, headaches, hard time swelling, felt like throat was closing up  Hives, headaches, hard time swelling, felt like throat was closing up    Hives, headaches, hard time swelling, felt like throat was closing up Hives, headaches, hard time swelling, felt like throat was closing up   Diphenhydramine Other (See Comments)   Estrogens Conjugated    Estrogens Conjugated     Nitrofurantoin  Nausea Only    You were cared for by a hospitalist during your hospital stay. If you have any questions about your discharge medications or the care you received while you were in the hospital after you are discharged, you can call the unit and asked to speak with the hospitalist on call if the hospitalist that took care of you is not available. Once you are discharged, your primary care physician will handle any further medical issues. Please note that no refills for any discharge medications will be authorized once you are discharged, as it is imperative that you return to your primary care physician (or establish a relationship with a primary care physician if you  do not have one) for your aftercare needs so that they can reassess your need for medications and monitor your lab values.  You were cared for by a hospitalist during your hospital stay. If you have any questions about your discharge medications or the care you received while you were in the hospital after you are discharged, you can call the unit and asked to speak with the hospitalist on call if the hospitalist that took care of you is not available. Once you are discharged, your primary care physician will handle any further medical issues. Please note that NO REFILLS for any discharge medications will be authorized once you are discharged, as it is imperative that you return to your primary care physician (or establish a relationship with a primary care physician if you do not have one) for your aftercare needs so that they can reassess your need for medications and monitor your lab values.  Please request your Prim.MD to go over all Hospital Tests and Procedure/Radiological results at the follow up, please get all Hospital records sent to your Prim MD by signing hospital release before you go home.  Get CBC, CMP, 2 view Chest X ray checked  by Primary MD during your next visit or SNF MD in 5-7 days ( we routinely change or  add medications that can affect your baseline labs and fluid status, therefore we recommend that you get the mentioned basic workup next visit with your PCP, your PCP may decide not to get them or add new tests based on their clinical decision)  On your next visit with your primary care physician please Get Medicines reviewed and adjusted.  If you experience worsening of your admission symptoms, develop shortness of breath, life threatening emergency, suicidal or homicidal thoughts you must seek medical attention immediately by calling 911 or calling your MD immediately  if symptoms less severe.  You Must read complete instructions/literature along with all the possible adverse reactions/side effects for all the Medicines you take and that have been prescribed to you. Take any new Medicines after you have completely understood and accpet all the possible adverse reactions/side effects.   Do not drive, operate heavy machinery, perform activities at heights, swimming or participation in water activities or provide baby sitting services if your were admitted for syncope or siezures until you have seen by Primary MD or a Neurologist and advised to do so again.  Do not drive when taking Pain medications.   Procedures/Studies: US  Venous Img Lower Bilateral (DVT) Result Date: 07/20/2023 CLINICAL DATA:  Bilateral leg swelling, pain and redness for 1-2 months. EXAM: BILATERAL LOWER EXTREMITY VENOUS DOPPLER ULTRASOUND TECHNIQUE: Gray-scale sonography with graded compression, as well as color Doppler and duplex ultrasound were performed to evaluate the lower extremity deep venous systems from the level of the common femoral vein and including the common femoral, femoral, profunda femoral, popliteal and calf veins including the posterior tibial, peroneal and gastrocnemius veins when visible. The superficial great saphenous vein was also interrogated. Spectral Doppler was utilized to evaluate flow at rest and with  distal augmentation maneuvers in the common femoral, femoral and popliteal veins. COMPARISON:  None Available. FINDINGS: RIGHT LOWER EXTREMITY Common Femoral Vein: No evidence of thrombus. Normal compressibility, respiratory phasicity and response to augmentation. Saphenofemoral Junction: No evidence of thrombus. Normal compressibility and flow on color Doppler imaging. Profunda Femoral Vein: No evidence of thrombus. Normal compressibility and flow on color Doppler imaging. Femoral Vein: No evidence of thrombus. Normal compressibility, respiratory phasicity and  response to augmentation. Popliteal Vein: No evidence of thrombus. Normal compressibility, respiratory phasicity and response to augmentation. Calf Veins: No evidence of thrombus. Normal compressibility and flow on color Doppler imaging. Superficial Great Saphenous Vein: No evidence of thrombus. Normal compressibility. Venous Reflux:  None. Other Findings:  None. LEFT LOWER EXTREMITY Common Femoral Vein: No evidence of thrombus. Normal compressibility, respiratory phasicity and response to augmentation. Saphenofemoral Junction: No evidence of thrombus. Normal compressibility and flow on color Doppler imaging. Profunda Femoral Vein: No evidence of thrombus. Normal compressibility and flow on color Doppler imaging. Femoral Vein: The LEFT femoral vein is limited in visualization. Popliteal Vein: No evidence of thrombus. Normal compressibility, respiratory phasicity and response to augmentation. Calf Veins: No evidence of thrombus. Normal compressibility and flow on color Doppler imaging. Superficial Great Saphenous Vein: No evidence of thrombus. Normal compressibility. Venous Reflux:  None. Other Findings:  None. IMPRESSION: Limited visualization of the LEFT femoral vein without evidence of deep venous thrombosis in either lower extremity. Electronically Signed   By: Suzen Dials M.D.   On: 07/20/2023 19:25   DG Tibia/Fibula Right Result Date:  07/20/2023 CLINICAL DATA:  Leg swelling and cellulitis. Evaluate for soft tissue gas. EXAM: RIGHT TIBIA AND FIBULA - 2 VIEW COMPARISON:  None Available. FINDINGS: There is no evidence of fracture or other focal bone lesions. There is diffuse soft tissue swelling of the lower extremity. No definite soft tissue gas identified. Peripheral vascular calcifications are present. IMPRESSION: Diffuse soft tissue swelling of the lower extremity. No definite soft tissue gas identified. Electronically Signed   By: Greig Pique M.D.   On: 07/20/2023 18:46     The results of significant diagnostics from this hospitalization (including imaging, microbiology, ancillary and laboratory) are listed below for reference.     Microbiology: Recent Results (from the past 240 hours)  Blood culture (routine x 2)     Status: None (Preliminary result)   Collection Time: 07/20/23  3:36 PM   Specimen: BLOOD LEFT FOREARM  Result Value Ref Range Status   Specimen Description   Final    BLOOD LEFT FOREARM Performed at [facility], [address] [place], [place] [address]    Special Requests   Final    Blood Culture adequate volume BOTTLES DRAWN AEROBIC AND ANAEROBIC Performed at [facility], [address], [place], [place] [address]    Culture   Final    NO GROWTH 2 DAYS Performed at [facility], [address], [place], [place] [address]    Report Status PENDING  Incomplete  Blood culture (routine x 2)     Status: None (Preliminary result)   Collection Time: 07/20/23  9:23 PM   Specimen: BLOOD  Result Value Ref Range Status   Specimen Description BLOOD SITE NOT SPECIFIED  Final   Special Requests   Final    BOTTLES DRAWN AEROBIC AND ANAEROBIC Blood Culture adequate volume   Culture   Final    NO GROWTH 2 DAYS Performed at [facility], [address] [place], [place] [address]    Report Status PENDING  Incomplete     Labs: BNP (last 3 results) No results for  input(s): BNP in the last 8760 hours. Basic Metabolic Panel: Recent Labs  Lab 07/20/23 1546 07/21/23 0106 07/22/23 0627  NA 140 140 137  K 5.5* 4.9 5.3*  CL 104 108 111  CO2 25 24 21*  GLUCOSE 126* 100* 129*  BUN 64* 57* 44*  CREATININE 2.69* 2.27* 2.03*  CALCIUM  9.7 9.1 8.6*  MG  --   --  2.3  PHOS  --   --  3.6   Liver Function Tests: Recent Labs  Lab 07/20/23 1546 07/21/23 0106  AST 24 21  ALT 9 12  ALKPHOS 80 57  BILITOT 0.4 0.7  PROT 7.2 6.3*  ALBUMIN 3.8 3.1*   Recent Labs  Lab 07/20/23 1546  LIPASE 32   No results for input(s): AMMONIA in the last 168 hours. CBC: Recent Labs  Lab 07/20/23 1546 07/21/23 0106 07/22/23 0627  WBC 9.5 8.3 8.9  NEUTROABS 6.9  --   --   HGB 10.6* 10.5* 10.5*  HCT 32.7* 33.1* 33.6*  MCV 93.4 94.8 96.6  PLT 226 198 192   Cardiac Enzymes: No results for input(s): CKTOTAL, CKMB, CKMBINDEX, TROPONINI in the last 168 hours. BNP: Invalid input(s): POCBNP CBG: Recent Labs  Lab 07/21/23 1140 07/21/23 1218 07/21/23 1614 07/21/23 2119 07/22/23 0617  GLUCAP 118* 102* 109* 132* 126*   D-Dimer No results for input(s): DDIMER in the last 72 hours. Hgb A1c Recent Labs    07/21/23 0106  HGBA1C 6.9*   Lipid Profile No results for input(s): CHOL, HDL, LDLCALC, TRIG, CHOLHDL, LDLDIRECT in the last 72 hours. Thyroid  function studies No results for input(s): TSH, T4TOTAL, T3FREE, THYROIDAB in the last 72 hours.  Invalid input(s): FREET3 Anemia work up No results for input(s): VITAMINB12, FOLATE, FERRITIN, TIBC, IRON, RETICCTPCT in the last 72 hours. Urinalysis    Component Value Date/Time   COLORURINE YELLOW 07/21/2023 0308   APPEARANCEUR CLEAR 07/21/2023 0308   APPEARANCEUR Clear 05/12/2021 0953   LABSPEC 1.015 07/21/2023 0308   PHURINE 5.0 07/21/2023 0308   GLUCOSEU NEGATIVE 07/21/2023 0308   HGBUR NEGATIVE 07/21/2023 0308   BILIRUBINUR NEGATIVE 07/21/2023 0308    BILIRUBINUR Negative 10/13/2021 1329   BILIRUBINUR Negative 05/12/2021 0953   KETONESUR NEGATIVE 07/21/2023 0308   PROTEINUR NEGATIVE 07/21/2023 0308   UROBILINOGEN 0.2 10/13/2021 1329   NITRITE NEGATIVE 07/21/2023 0308   LEUKOCYTESUR SMALL (A) 07/21/2023 0308   Sepsis Labs Recent Labs  Lab 07/20/23 1546 07/21/23 0106 07/22/23 0627  WBC 9.5 8.3 8.9   Microbiology Recent Results (from the past 240 hours)  Blood culture (routine x 2)     Status: None (Preliminary result)   Collection Time: 07/20/23  3:36 PM   Specimen: BLOOD LEFT FOREARM  Result Value Ref Range Status   Specimen Description   Final    BLOOD LEFT FOREARM Performed at [facility], [address], [place], [place] [address]    Special Requests   Final    Blood Culture adequate volume BOTTLES DRAWN AEROBIC AND ANAEROBIC Performed at [facility], [address], [place], [place] [address]    Culture   Final    NO GROWTH 2 DAYS Performed at [facility], [address], [place], [place] [address]    Report Status PENDING  Incomplete  Blood culture (routine x 2)     Status: None (Preliminary result)   Collection Time: 07/20/23  9:23 PM   Specimen: BLOOD  Result Value Ref Range Status   Specimen Description BLOOD SITE NOT SPECIFIED  Final   Special Requests   Final    BOTTLES DRAWN AEROBIC AND ANAEROBIC Blood Culture adequate volume   Culture   Final    NO GROWTH 2 DAYS Performed at [facility], [address], [place], [place] [address]  Report Status PENDING  Incomplete     Time coordinating discharge:  I have spent 35 minutes face to face with the patient and on the ward discussing the patients care, assessment, plan and disposition with other care givers. >50% of the time was devoted counseling the patient about the risks and benefits of treatment/Discharge disposition and coordinating care.   SIGNED:   Burgess JAYSON Dare, MD  [facility] 07/22/2023, 5:21  PM   If 7PM-7AM, please contact night-coverage

## 2023-07-25 LAB — CULTURE, BLOOD (ROUTINE X 2)
Culture: NO GROWTH
Culture: NO GROWTH
Special Requests: ADEQUATE
Special Requests: ADEQUATE

## 2023-07-26 LAB — TOXASSURE SELECT 13 (MW), URINE

## 2023-07-26 NOTE — Progress Notes (Signed)
 [facility] - [facility]  Chief complaint: Hospital Follow Up   Subjective: Tracy Mann is a 89 y.o. female here for hospital f/u. History of Present Illness Tracy Mann is an 89 year old female who presents with numbness in her left leg and cellulitis follow-up.  She has a history of cellulitis for which she was hospitalized and received IV antibiotics. She was discharged with oral antibiotics, including Augmentin , which she has not completed. The redness is spreading upwards, and she is concerned about the effectiveness of the remaining doses. She experiences itching and has tried Benadryl and Benadryl cream without significant relief.  She is actively trying to maintain mobility by walking with a walker every two hours for fifteen to twenty minutes. She has three walkers, one of which she keeps in her car. She is cautious about driving due to the numbness in her left leg.    Current Outpatient Medications:  .  apixaban  (ELIQUIS ) 5 mg tablet, Take 5 mg by mouth 2 (two) times daily, Disp: , Rfl:  .  blood glucose diagnostic test strip, 1 each (1 strip total) 3 (three) times daily Use as instructed., Disp: 300 each, Rfl: 3 .  blood glucose meter kit, as directed, Disp: 1 each, Rfl: 0 .  blood-glucose meter,continuous (FREESTYLE LIBRE 3 READER) Misc, Use as directed, Disp: , Rfl:  .  blood-glucose sensor (FREESTYLE LIBRE 3 SENSOR) Devi, 1 each by Other route as directed, Disp: 5 each, Rfl: 5 .  cholecalciferol (VITAMIN D3) 1000 unit tablet, Take 1,000 Units by mouth once daily, Disp: , Rfl:  .  ezetimibe  (ZETIA ) 10 mg tablet, Take 1 tablet (10 mg total) by mouth once daily, Disp: 90 tablet, Rfl: 3 .  flavoxATE (URISPAS) 100 mg tablet, Take 1 tablet (100 mg total) by mouth 3 (three) times daily as needed, Disp: 270 tablet, Rfl: 3 .  fluticasone  propionate (FLONASE ) 50 mcg/actuation nasal spray, Place 2 sprays into both nostrils once daily as needed, Disp: 16 g, Rfl: 5 .  glucagon  (GVOKE HYPOPEN 2-PACK) 1 mg/0.2 mL auto-injector, Inject 0.2 mLs (1 mg total) subcutaneously as directed for Low blood sugar in case of severe hypoglycemia, Disp: 0.4 mL, Rfl: 1 .  HYDROcodone -acetaminophen  (NORCO) 10-325 mg tablet, Take 1 tablet by mouth every 6 (six) hours as needed for Pain MD Only  Wants Pt. To Take Only 1 BID   (Only 2 a day), Disp: , Rfl:  .  insulin  LISPRO (HUMALOG  KWIKPEN) pen injector (concentration 100 units/mL), Take three times daily as directed, max dose is 50 units daily., Disp: 15 mL, Rfl: 11 .  LANTUS  SOLOSTAR U-100 INSULIN  pen injector (concentration 100 units/mL), Inject 24 units nightly. Adjust dose as directed. Max daily dose is 50 units., Disp: 15 mL, Rfl: 5 .  levothyroxine  (SYNTHROID ) 25 MCG tablet, TAKE 1 TABLET BY MOUTH DAILY ON  AN EMPTY STOMACH WITH A FULL  GLASS OF WATER AT LEAST 1/2 TO 1 HOUR BEFORE BREAKFAST, Disp: 90 tablet, Rfl: 3 .  linezolid  (ZYVOX ) 600 mg tablet, Take 600 mg by mouth, Disp: , Rfl:  .  lisinopriL  (ZESTRIL ) 10 MG tablet, Take 1 tablet (10 mg total) by mouth once daily, Disp: 90 tablet, Rfl: 3 .  LORazepam  (ATIVAN ) 0.5 MG tablet, Take 1 tablet (0.5 mg total) by mouth every 8 (eight) hours as needed for Anxiety (Patient taking differently: Take 0.5 mg by mouth every 8 (eight) hours as needed for Anxiety No More Than 2 A DAY), Disp: 30  tablet, Rfl: 0 .  magnesium  gluconate (MAGONATE) 27.5 mg (500 mg) tablet, Take 1,000 mg by mouth 2 (two) times daily, Disp: , Rfl:  .  mupirocin  (BACTROBAN ) 2 % cream, Apply topically 3 (three) times daily, Disp: 15 g, Rfl: 0 .  nitroGLYcerin  (NITROSTAT ) 0.4 MG SL tablet, Place under the tongue every 5 (five) minutes as needed, Disp: , Rfl:  .  nitroGLYcerin  (NITROSTAT ) 0.4 MG SL tablet, Place 0.4 mg under the tongue every 5 (five) minutes as needed, Disp: , Rfl:  .  ondansetron  (ZOFRAN ) 4 MG tablet, Take by mouth every 12 (twelve) hours as needed, Disp: , Rfl:  .  pen needle, diabetic (BD ULTRA-FINE MINI PEN  NEEDLE) 31 gauge x 3/16 needle, Use as directed Use 3 times daily with insulin  pens. E11.9 (Patient taking differently: Use as directed Use 4 times daily with insulin  pens. E11.9), Disp: 100 each, Rfl: 1 .  phenazopyridine  (PYRIDIUM ) 200 MG tablet, Take 200 mg by mouth 3 (three) times daily with meals, Disp: , Rfl:  .  rOPINIRole  (REQUIP ) 0.25 MG tablet, Take by mouth at bedtime as needed, Disp: , Rfl:  .  TORsemide  (DEMADEX ) 20 MG tablet, Take 2 tablets (40 mg total) by mouth once daily, Disp: 180 tablet, Rfl: 3 .  trimethoprim  100 mg tablet, Take 1 tablet (100 mg total) by mouth once daily Start after completion of the Monurol., Disp: 30 tablet, Rfl: 11 .  amoxicillin -clavulanate (AUGMENTIN ) 875-125 mg tablet, Take 1 tablet (875 mg total) by mouth 2 (two) times daily for 10 days (Patient not taking: Reported on 07/26/2023), Disp: 20 tablet, Rfl: 0 .  estradioL  (ESTRACE ) 0.01 % (0.1 mg/gram) vaginal cream, Place 0.5 g vaginally once daily Apply dab to urethral opening nightly (Patient not taking: Reported on 07/19/2023), Disp: 42.5 g, Rfl: 1 .  fosfomycin (MONUROL) 3 gram packet, Dissolve in 3-4 ounces of water. Use One packet today, then repeat this in 3 days. (Patient not taking: Reported on 07/26/2023), Disp: 2 packet, Rfl: 0 .  lancets, Use 1 each 3 (three) times daily Use as instructed., Disp: 100 each, Rfl: 11 .  naloxone  (NARCAN ) 4 mg/actuation nasal spray, Place into one nostril once, Disp: , Rfl:   ROS reviewed: General ROS:  Negative for  - fatigue, fevers, chills HEENT ROS: negative for seasonal allergies, congestion, cough Respiratory ROS: negative for - cough, SOB, wheezing Cardiovascular ROS: no chest pain or dyspnea on exertion Gastrointestinal ROS: negative for constipation, N/V,D Genito-Urinary ROS: no dysuria, trouble voiding, or hematuria Musculoskeletal ROS: negative for joint pain, swelling Neurological ROS: negative for headaches, dizzinesss Dermatological ROS: negative for  rash or skin lesion    Psychological ROS: negative for depression or anxiety  Allergies  Allergen Reactions  . Clindamycin  Shortness Of Breath and Swelling  . Duloxetine  Unknown    Same as Paroxetine   . Paroxetine  Unknown    Horrible headache, Stomach pain, Diarrhea  . Sulfa (Sulfonamide Antibiotics) Anaphylaxis  . Cephalexin  Other (See Comments)    Blisters  . Clarithromycin Unknown    Hives, headaches, hard time swelling, felt like throat was closing up  . Diphenhydramine Other (See Comments)  . Estrogens, Conjugated Other (See Comments)  . Nitrofurantoin  Nausea  . Other Swelling    'tongue swelling'  . Phentermine Nausea and Vomiting    Social History   Tobacco Use  . Smoking status: Never    Passive exposure: Past  . Smokeless tobacco: Never  Vaping Use  . Vaping status: Never Used  Substance Use Topics  . Alcohol use: No    Alcohol/week: 0.0 standard drinks of alcohol  . Drug use: No      Objective:  BP 118/70 (BP Location: Left upper arm, Patient Position: Sitting, BP Cuff Size: Adult)   Pulse 80   Ht 160 cm (5' 3)   Wt 88.5 kg (195 lb)   SpO2 97%   BMI 34.54 kg/m  reviewed. Gen: AAOx3. Well-developed and well-nourished. NAD.  HEENT:    HEAD NORMOCEPHALIC.  PERRLA, EOM intact.  No thyromegaly present. No lymphadpathy. Cardiovascular: Normal rate, regular rhythm. Normal S1 and S2 without murmus, rubs or gallops.  Pulmonary/Chest: CTAB. Effort normal and breath sounds normal. No respiratory distress. No wheezes or rales. Abdomen: Soft. Bowel sounds are normal. No distension or tenderness.  Neuro: Cranial nervess II-XII intact.Nonfocal exam. Skin: Skin is warm and dry.  Musculoskeletal: Normal range of motion. No edema, no tenderness.  Psychiatric: Normal mood and affect. Her behavior is normal. Judgment and thought content normal  Assessment and Plan: Assessment & Plan Cellulitis of bilateral lower extremities Cellulitis improving but risk of recurrence  due to redness. Current treatment with linezolid , plan to restart Augmentin  for comprehensive coverage. - Restart amoxicillin -clavulanate (Augmentin ) after completing linezolid . - Refer to dermatology for further evaluation of the rash.  Numbness in left leg Numbness from knee down with episodes of instability, slightly improved. Possible nerve, arthritis, or muscle weakness issues. Emphasized maintaining muscle strength. - Encourage movement with a walker to maintain muscle strength. - Advise against driving until improvement. - Schedule follow-up in one week to reassess.  Pruritus Persistent itching despite Benadryl, likely related to skin condition or medication. Considered stronger antihistamine. - Prescribe a stronger antihistamine.  General Health Maintenance Regular movement essential to prevent muscle weakness. - Encourage regular movement with a walker. - Advise to elevate legs when not active.   Cellulitis of right lower extremity  (primary encounter diagnosis)  Dermatitis Plan: Ambulatory Referral to Dermatology  Stasis dermatitis of both legs  Type 2 diabetes mellitus with hyperglycemia, with long-term current use of insulin  (CMS/HHS-HCC)  Longstanding persistent atrial fibrillation (CMS/HHS-HCC)  Obesity (BMI 35.0-39.9 without comorbidity), unspecified  CKD (chronic kidney disease) stage 4, GFR 15-29 ml/min (CMS/HHS-HCC)  Anxiety, generalized  Acquired hypothyroidism  Essential hypertension  Aortic atherosclerosis ()  Statin intolerance     This note has been created using automated tools and reviewed for accuracy by RICHARD LESLIE GILBERT.

## 2023-07-27 ENCOUNTER — Telehealth: Payer: Self-pay | Admitting: Cardiovascular Disease

## 2023-07-27 NOTE — Telephone Encounter (Signed)
 Pt c/o medication issue:  1. Name of Medication:    2. How are you currently taking this medication (dosage and times per day)?   3. Are you having a reaction (difficulty breathing--STAT)? No  4. What is your medication issue? Patient is requesting for a refill on this medication due to her being out. Patient stated she the kidney doctor had taken her off this medication but mentioned Dr. [name] informed her to take it as needed. Patient is requesting the medication sent as a 90 day supply to  [id number] - [place] ([address]), [place] - [address]

## 2023-07-30 MED ORDER — TORSEMIDE 20 MG PO TABS: 40.0000 mg | ORAL_TABLET | Freq: Every day | ORAL | 3 refills | Status: AC

## 2023-07-30 NOTE — Telephone Encounter (Signed)
 Called patient and notified her of the following from Dr. [name].  Over the past year there has been worsening of her renal function Metolazone  has the potential to make her kidney function even worse I would recommend she talk with the nephrologist For worsening leg swelling, weight gain above her goal, best option may be to take extra torsemide  40 sparingly after lunch in addition to her 40 daily With extra 40 if weight continues to trend upwards, would let us  know We might have to use metolazone  very sparingly but there is the potential for renal complication Thx TGollan  Patient verbalizes understanding. Prescription sent to preferred pharmacy.  Patient reports that she was discharged from the hospital on 07/22/23. Patient reports that since being discharge she has weakness, numbness to left leg and palpitations. Patient reports that she has fallen twice due to weakness since being discharged. Patient was seen by PCP on 07/26/23 and has a follow up on 08/02/22. Patient states that she wonders if her symptoms are from her heart. Offered patient appointments on 07/31/23 and 08/02/23 but patient declined appointments. Will forward to provider for recommendations.

## 2023-07-31 NOTE — Telephone Encounter (Signed)
 Called patient and left detailed message per DPR. Left message with the following from Dr. [name].  I read through the hospital records and from what I could determine, heart looked relatively stable Would recommend Ace wrap's, Velcro wraps for her legs for swelling Leg elevation when sitting Would hope she has PT coming to the house Thx TG   Asked patient to call back with any questions.

## 2023-08-13 DIAGNOSIS — S8002XA Contusion of left knee, initial encounter: Secondary | ICD-10-CM | POA: Insufficient documentation

## 2023-08-23 NOTE — Progress Notes (Signed)
 [facility] - [facility]  Chief complaint: Follow-up   Subjective: Tracy Mann is a 89 y.o. female here for f/u. History of Present Illness Tracy Mann is an 89 year old female who presents for follow-up after a knee injection.  She experienced significant improvement in knee pain following a recent injection, with a notable decrease in pain and swelling. She remains mindful with her movements. Despite the improvement, she remains cautious to avoid losing balance.  She describes a dermatological issue involving small bumps that, when the scab is removed, result in a 'deep pin pick' that leaks. This condition has been persistent, and she has been in contact with dermatology regarding it. She recalls seeing a dermatologist in the past but cannot remember the name.  Her blood pressure was recorded at 130/78, which she notes is slightly higher than usual but has been stable in previous visits. She has a history of iron deficiency anemia and is concerned about her current energy levels, feeling similar to when she had anemia as a teenager. She has not been eating much meat, which she acknowledges could affect her hemoglobin levels and hair growth.  She reports a decrease in appetite over the past six months, leading to weight loss. Her diet primarily consists of fruits like bananas and watermelon, and blueberry muffins, with limited vegetable intake unless dining out. She wants to know her current cholesterol and vitamin D3 levels, as well as her hemoglobin, due to her dietary changes and past medical history. Her skin is healing well as her edema is much improved in LE.   Current Outpatient Medications:  .  apixaban  (ELIQUIS ) 5 mg tablet, Take 5 mg by mouth 2 (two) times daily, Disp: , Rfl:  .  blood glucose diagnostic test strip, 1 each (1 strip total) 3 (three) times daily Use as instructed., Disp: 300 each, Rfl: 3 .  blood-glucose meter,continuous (FREESTYLE LIBRE 3 READER) Misc,  Use as directed, Disp: , Rfl:  .  blood-glucose sensor (FREESTYLE LIBRE 3 SENSOR) Devi, 1 each by Other route as directed, Disp: 5 each, Rfl: 5 .  cetirizine (ZYRTEC) 10 MG tablet, Take 1 tablet (10 mg total) by mouth once daily Take twice daily for now for itching., Disp: 30 tablet, Rfl: 11 .  cholecalciferol (VITAMIN D3) 1000 unit tablet, Take 1,000 Units by mouth once daily, Disp: , Rfl:  .  estradioL  (ESTRACE ) 0.01 % (0.1 mg/gram) vaginal cream, Place 0.5 g vaginally once daily Apply dab to urethral opening nightly, Disp: 42.5 g, Rfl: 1 .  ezetimibe  (ZETIA ) 10 mg tablet, Take 1 tablet (10 mg total) by mouth once daily, Disp: 90 tablet, Rfl: 3 .  flavoxATE (URISPAS) 100 mg tablet, Take 1 tablet (100 mg total) by mouth 3 (three) times daily as needed, Disp: 270 tablet, Rfl: 3 .  fluticasone  propionate (FLONASE ) 50 mcg/actuation nasal spray, Place 2 sprays into both nostrils once daily as needed, Disp: 16 g, Rfl: 5 .  glucagon (GVOKE HYPOPEN 2-PACK) 1 mg/0.2 mL auto-injector, Inject 0.2 mLs (1 mg total) subcutaneously as directed for Low blood sugar in case of severe hypoglycemia, Disp: 0.4 mL, Rfl: 1 .  HYDROcodone -acetaminophen  (NORCO) 10-325 mg tablet, Take 1 tablet by mouth every 6 (six) hours as needed for Pain MD Only  Wants Pt. To Take Only 1 BID   (Only 2 a day), Disp: , Rfl:  .  insulin  LISPRO (HUMALOG  KWIKPEN) pen injector (concentration 100 units/mL), Take three times daily as directed, max dose is 50  units daily., Disp: 15 mL, Rfl: 11 .  lancets, Use 1 each 3 (three) times daily Use as instructed., Disp: 100 each, Rfl: 11 .  LANTUS  SOLOSTAR U-100 INSULIN  pen injector (concentration 100 units/mL), Inject 24 units nightly. Adjust dose as directed. Max daily dose is 50 units., Disp: 15 mL, Rfl: 5 .  levothyroxine  (SYNTHROID ) 25 MCG tablet, TAKE 1 TABLET BY MOUTH DAILY ON  AN EMPTY STOMACH WITH A FULL  GLASS OF WATER AT LEAST 1/2 TO 1 HOUR BEFORE BREAKFAST, Disp: 90 tablet, Rfl: 3 .   lisinopriL  (ZESTRIL ) 10 MG tablet, Take 1 tablet (10 mg total) by mouth once daily, Disp: 90 tablet, Rfl: 3 .  LORazepam  (ATIVAN ) 0.5 MG tablet, Take 1 tablet (0.5 mg total) by mouth every 8 (eight) hours as needed for Anxiety (Patient taking differently: Take 0.5 mg by mouth every 8 (eight) hours as needed for Anxiety No More Than 2 A DAY), Disp: 30 tablet, Rfl: 0 .  magnesium  gluconate (MAGONATE) 27.5 mg (500 mg) tablet, Take 1,000 mg by mouth 2 (two) times daily, Disp: , Rfl:  .  mupirocin  (BACTROBAN ) 2 % cream, Apply topically 3 (three) times daily, Disp: 15 g, Rfl: 0 .  naloxone  (NARCAN ) 4 mg/actuation nasal spray, Place into one nostril once, Disp: , Rfl:  .  nitroGLYcerin  (NITROSTAT ) 0.4 MG SL tablet, Place under the tongue every 5 (five) minutes as needed, Disp: , Rfl:  .  nitroGLYcerin  (NITROSTAT ) 0.4 MG SL tablet, Place 0.4 mg under the tongue every 5 (five) minutes as needed, Disp: , Rfl:  .  ondansetron  (ZOFRAN ) 4 MG tablet, Take by mouth every 12 (twelve) hours as needed, Disp: , Rfl:  .  pen needle, diabetic (BD ULTRA-FINE MINI PEN NEEDLE) 31 gauge x 3/16 needle, Use as directed Use 3 times daily with insulin  pens. E11.9 (Patient taking differently: Use as directed Use 4 times daily with insulin  pens. E11.9), Disp: 100 each, Rfl: 1 .  phenazopyridine  (PYRIDIUM ) 200 MG tablet, Take 200 mg by mouth 3 (three) times daily with meals, Disp: , Rfl:  .  rOPINIRole  (REQUIP ) 0.25 MG tablet, Take by mouth at bedtime as needed, Disp: , Rfl:  .  TORsemide  (DEMADEX ) 20 MG tablet, Take 2 tablets (40 mg total) by mouth once daily, Disp: 180 tablet, Rfl: 3 .  trimethoprim  100 mg tablet, Take 1 tablet (100 mg total) by mouth once daily Start after completion of the Monurol., Disp: 30 tablet, Rfl: 11 .  blood glucose meter kit, as directed, Disp: 1 each, Rfl: 0 .  fosfomycin (MONUROL) 3 gram packet, Dissolve in 3-4 ounces of water. Use One packet today, then repeat this in 3 days. (Patient not taking:  Reported on 07/26/2023), Disp: 2 packet, Rfl: 0  ROS reviewed: General ROS:  Negative for  - fatigue, fevers, chills HEENT ROS: negative for seasonal allergies, congestion, cough Respiratory ROS: negative for - cough, SOB, wheezing Cardiovascular ROS: no chest pain or dyspnea on exertion Gastrointestinal ROS: negative for constipation, N/V,D Genito-Urinary ROS: no dysuria, trouble voiding, or hematuria Musculoskeletal ROS: negative for joint pain, swelling Neurological ROS: negative for headaches, dizzinesss Dermatological ROS: negative for rash or skin lesion    Psychological ROS: negative for depression or anxiety  Allergies  Allergen Reactions  . Clindamycin  Shortness Of Breath and Swelling  . Duloxetine  Unknown    Same as Paroxetine   . Paroxetine  Unknown    Horrible headache, Stomach pain, Diarrhea  . Sulfa (Sulfonamide Antibiotics) Anaphylaxis  . Cephalexin  Other (  See Comments)    Blisters  . Clarithromycin Unknown    Hives, headaches, hard time swelling, felt like throat was closing up  . Diphenhydramine Other (See Comments)  . Estrogens, Conjugated Other (See Comments)  . Nitrofurantoin  Nausea  . Other Swelling    'tongue swelling'  . Phentermine Nausea and Vomiting    Social History   Tobacco Use  . Smoking status: Never    Passive exposure: Past  . Smokeless tobacco: Never  Vaping Use  . Vaping status: Never Used  Substance Use Topics  . Alcohol use: No    Alcohol/week: 0.0 standard drinks of alcohol  . Drug use: No      Objective:  BP 130/78 (BP Location: Left upper arm, Patient Position: Sitting, BP Cuff Size: Adult)   Pulse 101   Ht 160 cm (5' 3)   Wt 85.3 kg (188 lb)   SpO2 98%   BMI 33.30 kg/m  reviewed. Gen: AAOx3. Well-developed and well-nourished. NAD.  HEENT:    HEAD NORMOCEPHALIC.  PERRLA, EOM intact.  No thyromegaly present. No lymphadpathy. Cardiovascular: Normal rate, regular rhythm. Normal S1 and S2 without murmus, rubs or gallops.   Pulmonary/Chest: CTAB. Effort normal and breath sounds normal. No respiratory distress. No wheezes or rales. Abdomen: Soft. Bowel sounds are normal. No distension or tenderness.  Neuro: Cranial nervess II-XII intact.Nonfocal exam. Skin: Skin is warm and dry.  Musculoskeletal: Normal range of motion. No edema, no tenderness.  Psychiatric: Normal mood and affect. Her behavior is normal. Judgment and thought content normal  Assessment and Plan: Assessment & Plan Chronic pain of left knee Improvement post-injection, but not fully resolved. Swelling reduced. - Encourage cautious movement to prevent exacerbation.  Dermatitis and Cellulitis Improved appearance and symptoms. Referral needed for persistent bumps and scabs. - Refer to dermatology for further evaluation. - Elevate legs when not in use to manage swelling.  Chronic kidney disease Under nephrologist care with six-month follow-up interval. - Order blood work to assess kidney function.  Pure hypercholesterolemia Uncertain cholesterol status, desires update. - Order blood work to check cholesterol levels.  Iron deficiency anemia Symptoms suggestive of iron deficiency anemia, concerned about hemoglobin. - Order blood work to check hemoglobin levels.  Vitamin D deficiency Interested in vitamin D levels, taking supplements. - Order blood work to check vitamin D levels.  Nutritional concerns Decreased appetite and weight loss due to reduced intake of meat and vegetables. - Encourage balanced diet with adequate nutrition. - Monitor weight and nutritional intake.   Chronic pain of left knee  (primary encounter diagnosis)  Pure hypercholesterolemia Plan: Lipid Panel w/calc LDL  Dermatitis Plan: Ambulatory Referral to Dermatology  Cellulitis of right lower extremity Plan: CBC w/auto Differential (3 Part)  Stasis dermatitis of both legs Plan: CBC w/auto Differential (3 Part), Ambulatory        Referral to  Dermatology  Longstanding persistent atrial fibrillation (CMS/HHS-HCC)  Obesity (BMI 35.0-39.9 without comorbidity), unspecified  CKD (chronic kidney disease) stage 4, GFR 15-29 ml/min (CMS/HHS-HCC) Plan: Comprehensive Metabolic Panel (CMP)  Acquired hypothyroidism Plan: Thyroid  Stimulating-Hormone (TSH)  Aortic atherosclerosis () Plan: Comprehensive Metabolic Panel (CMP)  Type 2 diabetes mellitus with hyperglycemia, with long-term current use of insulin  (CMS/HHS-HCC)  Essential hypertension Plan: Comprehensive Metabolic Panel (CMP)  Anxiety, generalized  Statin intolerance  Chronic fatigue Plan: Vitamin D, 25-Hydroxy - [facility]       This note has been created using automated tools and reviewed for accuracy by RICHARD LESLIE GILBERT.

## 2023-09-04 ENCOUNTER — Encounter: Payer: Self-pay | Admitting: Dermatology

## 2023-09-04 ENCOUNTER — Ambulatory Visit: Admitting: Dermatology

## 2023-09-04 DIAGNOSIS — I872 Venous insufficiency (chronic) (peripheral): Secondary | ICD-10-CM | POA: Diagnosis not present

## 2023-09-04 DIAGNOSIS — L209 Atopic dermatitis, unspecified: Secondary | ICD-10-CM | POA: Diagnosis not present

## 2023-09-04 MED ORDER — TRIAMCINOLONE ACETONIDE 0.1 % EX CREA: TOPICAL_CREAM | CUTANEOUS | 1 refills | Status: AC

## 2023-09-04 NOTE — Patient Instructions (Addendum)
 BUG REPELLENT   COMPRESSION WRAPS: easier to put on that normal compression socks or stockings     Due to recent changes in healthcare laws, you may see results of your pathology and/or laboratory studies on MyChart before the doctors have had a chance to review them. We understand that in some cases there may be results that are confusing or concerning to you. Please understand that not all results are received at the same time and often the doctors may need to interpret multiple results in order to provide you with the best plan of care or course of treatment. Therefore, we ask that you please give us  2 business days to thoroughly review all your results before contacting the office for clarification. Should we see a critical lab result, you will be contacted sooner.   If You Need Anything After Your Visit  If you have any questions or concerns for your doctor, please call our main line at [phone] and press option 4 to reach your doctor's medical assistant. If no one answers, please leave a voicemail as directed and we will return your call as soon as possible. Messages left after 4 pm will be answered the following business day.   You may also send us  a message via MyChart. We typically respond to MyChart messages within 1-2 business days.  For prescription refills, please ask your pharmacy to contact our office. Our fax number is [phone].  If you have an urgent issue when the clinic is closed that cannot wait until the next business day, you can page your doctor at the number below.    Please note that while we do our best to be available for urgent issues outside of office hours, we are not available 24/7.   If you have an urgent issue and are unable to reach us , you may choose to seek medical care at your doctor's office, retail clinic, urgent care center, or emergency room.  If you have a medical emergency, please immediately call 911 or go to the emergency  department.  Pager Numbers  - Dr. Hester: [phone]  - Dr. Jackquline: [phone]  - Dr. Claudene: [phone]   In the event of inclement weather, please call our main line at [phone] for an update on the status of any delays or closures.  Dermatology Medication Tips: Please keep the boxes that topical medications come in in order to help keep track of the instructions about where and how to use these. Pharmacies typically print the medication instructions only on the boxes and not directly on the medication tubes.   If your medication is too expensive, please contact our office at [phone] option 4 or send us  a message through MyChart.   We are unable to tell what your co-pay for medications will be in advance as this is different depending on your insurance coverage. However, we may be able to find a substitute medication at lower cost or fill out paperwork to get insurance to cover a needed medication.   If a prior authorization is required to get your medication covered by your insurance company, please allow us  1-2 business days to complete this process.  Drug prices often vary depending on where the prescription is filled and some pharmacies may offer cheaper prices.  The website www.goodrx.com contains coupons for medications through different pharmacies. The prices here do not account for what the cost may be with help from insurance (it may be cheaper with your insurance), but the website can give you  the price if you did not use any insurance.  - You can print the associated coupon and take it with your prescription to the pharmacy.  - You may also stop by our office during regular business hours and pick up a GoodRx coupon card.  - If you need your prescription sent electronically to a different pharmacy, notify our office through [facility] or by phone at [phone] option 4.     Si Usted Necesita Algo Despus de Su Visita  Tambin puede enviarnos  un mensaje a travs de Clinical cytogeneticist. Por lo general respondemos a los mensajes de MyChart en el transcurso de 1 a 2 das hbiles.  Para renovar recetas, por favor pida a su farmacia que se ponga en contacto con nuestra oficina. Randi lakes de fax es [place] [phone].  Si tiene un asunto urgente cuando la clnica est cerrada y que no puede esperar hasta el siguiente da hbil, puede llamar/localizar a su doctor(a) al nmero que aparece a continuacin.   Por favor, tenga en cuenta que aunque hacemos todo lo posible para estar disponibles para asuntos urgentes fuera del horario de [place], no estamos disponibles las 24 horas del da, los 7 [address]  [address] de la [place].   Si tiene un problema urgente y no puede comunicarse con nosotros, puede optar por buscar atencin mdica  en el consultorio de su doctor(a), en una clnica privada, en un centro de atencin urgente o en una sala de emergencias.  Si tiene Engineer, drilling, por favor llame inmediatamente al 911 o vaya a la sala de emergencias.  Nmeros de bper  - Dr. Hester: [phone]  - Dra. Jackquline: [phone]  - Dr. Claudene: [phone]   En caso de inclemencias del tiempo, por favor llame a landry capes principal al [phone] para una actualizacin sobre el [place] de cualquier retraso o cierre.  Consejos para la medicacin en dermatologa: Por favor, guarde las cajas en las que vienen los medicamentos de uso tpico para ayudarle a seguir las instrucciones sobre dnde y cmo usarlos. Las farmacias generalmente imprimen las instrucciones del medicamento slo en las cajas y no directamente en los tubos del [place].   Si su medicamento es muy caro, por favor, pngase en contacto con landry rieger llamando al [phone] y presione la opcin 4 o envenos un mensaje a travs de Clinical cytogeneticist.   No podemos decirle cul ser su copago por los medicamentos por adelantado ya que esto es diferente dependiendo de la cobertura de su seguro. Sin  embargo, es posible que podamos encontrar un medicamento sustituto a Audiological scientist un formulario para que el seguro cubra el medicamento que se considera necesario.   Si se requiere una autorizacin previa para que su compaa de seguros malta su medicamento, por favor permtanos de 1 a 2 das hbiles para completar este proceso.  Los precios de los medicamentos varan con frecuencia dependiendo del Environmental consultant de dnde se surte la receta y alguna farmacias pueden ofrecer precios ms baratos.  El sitio web www.goodrx.com tiene cupones para medicamentos de Health and safety inspector. Los precios aqu no tienen en cuenta lo que podra costar con la ayuda del seguro (puede ser ms barato con su seguro), pero el sitio web puede darle el precio si no utiliz Tourist information centre manager.  - Puede imprimir el cupn correspondiente y llevarlo con su receta a la farmacia.  - Tambin puede pasar por nuestra oficina durante el horario de atencin regular y Education officer, museum una tarjeta de cupones de GoodRx.  - Si  necesita que su receta se enve electrnicamente a una farmacia diferente, informe a nuestra oficina a travs de MyChart de [place] o por telfono llamando al [phone] y presione la opcin 4.

## 2023-09-04 NOTE — Progress Notes (Signed)
   New Patient Visit   Subjective  Tracy Mann is a 89 y.o. female who presents for the following: itching and breaking out at arms, hands and legs. She did have some at chest but that has resolved. Patient has taken Zyrtec and used HC 0.5% cr which hasn't helped.   Patient also with recurring cellulitis when she gets bit by mosquito. She has had to be hospitalized twice and put on abx.   The following portions of the chart were reviewed this encounter and updated as appropriate: medications, allergies, medical history  Review of Systems:  No other skin or systemic complaints except as noted in HPI or Assessment and Plan.  Objective  Well appearing patient in no apparent distress; mood and affect are within normal limits.   A focused examination was performed of the following areas: Legs, arms, hands  Relevant exam findings are noted in the Assessment and Plan.    Assessment & Plan    STASIS DERMATITIS Exam: Erythematous, scaly patches involving the ankle and distal lower leg with associated lower leg edema. R > L  flared  Stasis in the legs causes chronic leg swelling, which may result in itchy or painful rashes, skin discoloration, skin texture changes, and sometimes ulceration.  Recommend daily graduated compression hose/stockings- easiest to put on first thing in morning, remove at bedtime.  Elevate legs as much as possible. Avoid salt/sodium rich foods.  Treatment Plan: Continue to elevate legs when able. Patient unable to wear compression stockings, hurts her back.  Patient given information for compression leg wraps.  Triamcinolone  BID until smooth for rash  Atopic dermatitis vs id reaction from venous stasis Exam: Scattered pink scaly papules coalescing to plaques, excoriations, purpuric macules around knees, dorsal hands, wrists, forearms  Atopic dermatitis (eczema) is a chronic, relapsing, pruritic condition that can significantly affect quality of life. It is  often associated with allergic rhinitis and/or asthma and can require treatment with topical medications, phototherapy, or in severe cases biologic injectable medication (Dupixent; Adbry) or Oral JAK inhibitors.  Treatment Plan: Start TMC 0.1% cr twice daily to aa rash/itch or insect bites until improved. Avoid applying to face, groin, and axilla. Use as directed. Long-term use can cause thinning of the skin. Use picaridin prn before going out to [place] to prevent mosquito bites  Topical steroids (such as triamcinolone , fluocinolone, fluocinonide, mometasone, clobetasol, halobetasol, betamethasone , hydrocortisone ) can cause thinning and lightening of the skin if they are used for too long in the same area. Your physician has selected the right strength medicine for your problem and area affected on the body. Please use your medication only as directed by your physician to prevent side effects.   VENOUS STASIS DERMATITIS OF BOTH LOWER EXTREMITIES   ATOPIC DERMATITIS, UNSPECIFIED TYPE    Return in about 1 month (around 10/05/2023) for Atopic Dermatitis, with Dr. Claudene.  LILLETTE Lonell Drones, RMA, am acting as scribe for Boneta Claudene, MD .   Documentation: I have reviewed the above documentation for accuracy and completeness, and I agree with the above.  Boneta Claudene, MD

## 2023-09-14 ENCOUNTER — Encounter: Payer: Self-pay | Admitting: Ophthalmology

## 2023-09-14 ENCOUNTER — Other Ambulatory Visit: Payer: Self-pay

## 2023-09-20 NOTE — Discharge Instructions (Signed)

## 2023-09-24 ENCOUNTER — Other Ambulatory Visit: Payer: Self-pay

## 2023-09-24 ENCOUNTER — Ambulatory Visit: Payer: Self-pay | Admitting: Anesthesiology

## 2023-09-24 ENCOUNTER — Encounter: Payer: Self-pay | Admitting: Ophthalmology

## 2023-09-24 ENCOUNTER — Ambulatory Visit
Admission: RE | Admit: 2023-09-24 | Discharge: 2023-09-24 | Disposition: A | Discharge status: Home / Self Care | Attending: Ophthalmology | Admitting: Ophthalmology

## 2023-09-24 ENCOUNTER — Encounter
Admission: RE | Disposition: A | Discharge status: Home / Self Care | Payer: Self-pay | Source: Home / Self Care | Attending: Ophthalmology

## 2023-09-24 DIAGNOSIS — Z794 Long term (current) use of insulin: Secondary | ICD-10-CM | POA: Insufficient documentation

## 2023-09-24 DIAGNOSIS — E1122 Type 2 diabetes mellitus with diabetic chronic kidney disease: Secondary | ICD-10-CM | POA: Insufficient documentation

## 2023-09-24 DIAGNOSIS — I5042 Chronic combined systolic (congestive) and diastolic (congestive) heart failure: Secondary | ICD-10-CM | POA: Diagnosis not present

## 2023-09-24 DIAGNOSIS — I252 Old myocardial infarction: Secondary | ICD-10-CM | POA: Diagnosis not present

## 2023-09-24 DIAGNOSIS — N1832 Chronic kidney disease, stage 3b: Secondary | ICD-10-CM | POA: Diagnosis not present

## 2023-09-24 DIAGNOSIS — I130 Hypertensive heart and chronic kidney disease with heart failure and stage 1 through stage 4 chronic kidney disease, or unspecified chronic kidney disease: Secondary | ICD-10-CM | POA: Diagnosis not present

## 2023-09-24 DIAGNOSIS — E1136 Type 2 diabetes mellitus with diabetic cataract: Secondary | ICD-10-CM | POA: Diagnosis not present

## 2023-09-24 DIAGNOSIS — H2511 Age-related nuclear cataract, right eye: Secondary | ICD-10-CM | POA: Diagnosis present

## 2023-09-24 DIAGNOSIS — I2510 Atherosclerotic heart disease of native coronary artery without angina pectoris: Secondary | ICD-10-CM | POA: Diagnosis not present

## 2023-09-24 DIAGNOSIS — E039 Hypothyroidism, unspecified: Secondary | ICD-10-CM | POA: Insufficient documentation

## 2023-09-24 HISTORY — DX: Venous insufficiency (chronic) (peripheral): I87.2

## 2023-09-24 HISTORY — DX: Type 2 diabetes mellitus with diabetic chronic kidney disease: E11.22

## 2023-09-24 HISTORY — DX: Other chronic pain: G89.29

## 2023-09-24 HISTORY — DX: Long term (current) use of insulin: Z79.4

## 2023-09-24 HISTORY — PX: CATARACT EXTRACTION W/PHACO: SHX586

## 2023-09-24 LAB — GLUCOSE, CAPILLARY: Glucose-Capillary: 89 mg/dL (ref 70–99)

## 2023-09-24 SURGERY — PHACOEMULSIFICATION, CATARACT, WITH IOL INSERTION
Anesthesia: Monitor Anesthesia Care | Laterality: Right

## 2023-09-24 MED ORDER — SIGHTPATH DOSE#1 BSS IO SOLN
INTRAOCULAR | Status: DC | PRN
  Administered 2023-09-24: 15 mL via INTRAOCULAR

## 2023-09-24 MED ORDER — MOXIFLOXACIN HCL 0.5 % OP SOLN
OPHTHALMIC | Status: DC | PRN
  Administered 2023-09-24: .2 mL via OPHTHALMIC

## 2023-09-24 MED ORDER — LACTATED RINGERS IV SOLN: INTRAVENOUS | Status: DC

## 2023-09-24 MED ORDER — SIGHTPATH DOSE#1 NA HYALUR & NA CHOND-NA HYALUR IO KIT
PACK | INTRAOCULAR | Status: DC | PRN
  Administered 2023-09-24: 1 via OPHTHALMIC

## 2023-09-24 MED ORDER — ARMC OPHTHALMIC DILATING DROPS
1.0000 | OPHTHALMIC | Status: DC | PRN
  Administered 2023-09-24 (×3): 1 via OPHTHALMIC

## 2023-09-24 MED ORDER — MIDAZOLAM HCL 2 MG/2ML IJ SOLN
INTRAMUSCULAR | Status: DC | PRN
  Administered 2023-09-24: .5 mg via INTRAVENOUS

## 2023-09-24 MED ORDER — MIDAZOLAM HCL 2 MG/2ML IJ SOLN
INTRAMUSCULAR | Status: AC
  Filled 2023-09-24: qty 2

## 2023-09-24 MED ORDER — TETRACAINE HCL 0.5 % OP SOLN
OPHTHALMIC | Status: AC
Start: 2023-09-24 — End: 2023-09-24
  Filled 2023-09-24: qty 4

## 2023-09-24 MED ORDER — ARMC OPHTHALMIC DILATING DROPS
OPHTHALMIC | Status: AC
  Filled 2023-09-24: qty 0.5

## 2023-09-24 MED ORDER — FENTANYL CITRATE (PF) 100 MCG/2ML IJ SOLN
INTRAMUSCULAR | Status: DC | PRN
  Administered 2023-09-24 (×2): 25 ug via INTRAVENOUS

## 2023-09-24 MED ORDER — FENTANYL CITRATE (PF) 100 MCG/2ML IJ SOLN
INTRAMUSCULAR | Status: AC
  Filled 2023-09-24: qty 2

## 2023-09-24 MED ORDER — GLYCOPYRROLATE 0.2 MG/ML IJ SOLN
INTRAMUSCULAR | Status: AC
  Filled 2023-09-24: qty 1

## 2023-09-24 MED ORDER — TETRACAINE HCL 0.5 % OP SOLN
1.0000 [drp] | OPHTHALMIC | Status: DC | PRN
  Administered 2023-09-24 (×3): 1 [drp] via OPHTHALMIC

## 2023-09-24 MED ORDER — LIDOCAINE HCL (PF) 2 % IJ SOLN
INTRAOCULAR | Status: DC | PRN
  Administered 2023-09-24: 1 mL via INTRAOCULAR

## 2023-09-24 MED ORDER — SIGHTPATH DOSE#1 BSS IO SOLN
INTRAOCULAR | Status: DC | PRN
  Administered 2023-09-24: 108 mL via OPHTHALMIC

## 2023-09-24 SURGICAL SUPPLY — 10 items
DISSECTOR HYDRO NUCLEUS 50X22 (MISCELLANEOUS) ×2 IMPLANT
FEE CATARACT SUITE SIGHTPATH (MISCELLANEOUS) ×1 IMPLANT
GLOVE PI ULTRA LF STRL 7.5 (GLOVE) ×2 IMPLANT
GLOVE SURG SYN 6.5 PF PI BL (GLOVE) ×1 IMPLANT
GLOVE SURG SYN 8.5 PF PI BL (GLOVE) ×1 IMPLANT
LENS IOL TECNIS EYHANCE 24.0 (Intraocular Lens) IMPLANT
NDL FILTER BLUNT 18X1 1/2 (NEEDLE) ×1 IMPLANT
NEEDLE FILTER BLUNT 18X1 1/2 (NEEDLE) ×1 IMPLANT
RING MALYGIN (MISCELLANEOUS) IMPLANT
SYR 3ML LL SCALE MARK (SYRINGE) ×1 IMPLANT

## 2023-09-24 NOTE — Anesthesia Preprocedure Evaluation (Signed)
 Anesthesia Evaluation  Patient identified by MRN, date of birth, ID band Patient awake    Reviewed: Allergy & Precautions, NPO status , Patient's Chart, lab work & pertinent test results  Airway Mallampati: III  TM Distance: >3 FB Neck ROM: full    Dental  (+) Chipped   Pulmonary asthma , sleep apnea    Pulmonary exam normal        Cardiovascular hypertension, + CAD, + Past MI, + Peripheral Vascular Disease and +CHF  Normal cardiovascular exam     Neuro/Psych  PSYCHIATRIC DISORDERS Anxiety Depression     Neuromuscular disease    GI/Hepatic Neg liver ROS,GERD  Medicated,,  Endo/Other  diabetesHypothyroidism    Renal/GU Renal disease     Musculoskeletal   Abdominal   Peds  Hematology negative hematology ROS (+)   Anesthesia Other Findings Past Medical History: No date: Acute myocardial infarction of inferior wall (HCC)     Comment:  a. 10/2005 - Presumed to be 2/2 to a coronary embolus in              setting of persistent Afib-->Cath 2014 relatively nl               cors, EF 45-50% w/ severe distal inferior/apical inferior              HK w/ aneursymal appearance. No date: Arthritis No date: Chronic anticoagulation     Comment:  a. warfarin. No date: Chronic atrial fibrillation (HCC)     Comment:  a. CHA2DS2VASc = 6--> warfarin. No date: Chronic combined systolic and diastolic CHF (congestive  heart failure) (HCC)     Comment:  a. 10/2012 Echo: EF 35-40%, diff HK, mild MR, mild               biatrial enlargement;  b. 11/2012 EF 45-50% by LV gram;               c. echo 07/2014: EF 40-45%, mod conc LVH, mod MR, severe               biatrial enlargement, PASP 45 mm Hg c. Echo 01/2018 EF               45-50%, duffuse hypokinesis, mild MR, LA mod dilated,               norm RVSF, dilated IVC No date: Chronic low back pain with bilateral sciatica No date: Essential hypertension No date: Hyperlipidemia, mixed No  date: Mixed Ischemic/Nonischemic Cardiomyopathy     Comment:  a. 10/2012 Echo: EF 35-40%;  b. 11/2012 EF 45-50% by LV               gram. No date: Nonobstructive coronary artery disease     Comment:  a. 2007 inferior wall MI thought to be secondary to               thrombus from atrial fib b. 2014 cath with R dominant               coronary arterial system with mild luminal irregularities              c. Myoview  05/2015 old inferior MI, no new ischemic               changes No date: Obesity No date: Stasis dermatitis of both legs No date: Thyroid  disease     Comment:  hypothyroidism No date: Type 2 diabetes mellitus with stage 3b chronic kidney  disease, with long-term current use of insulin  (HCC) No date: Type II diabetes mellitus (HCC)     Comment:  a. 07/2018 A1c 8.7 - long term insulin  use No date: Venous insufficiency No date: Vitamin D deficiency  Past Surgical History: 11/2012: CARDIAC CATHETERIZATION     Comment:  [facility];EF 45-50% 1999: CHOLECYSTECTOMY 1968: TUBAL LIGATION 1996: VESICOVAGINAL FISTULA CLOSURE W/ TAH  BMI    Body Mass Index: 33.47 kg/m      Reproductive/Obstetrics negative OB ROS                              Anesthesia Physical Anesthesia Plan  ASA: 3  Anesthesia Plan: MAC   Post-op Pain Management:    Induction: Intravenous  PONV Risk Score and Plan:   Airway Management Planned: Natural Airway and Nasal Cannula  Additional Equipment:   Intra-op Plan:   Post-operative Plan:   Informed Consent: I have reviewed the patients History and Physical, chart, labs and discussed the procedure including the risks, benefits and alternatives for the proposed anesthesia with the patient or authorized representative who has indicated his/her understanding and acceptance.     Dental Advisory Given  Plan Discussed with: Anesthesiologist, CRNA and Surgeon  Anesthesia Plan Comments: (Patient consented for risks of anesthesia  including but not limited to:  - adverse reactions to medications - damage to eyes, teeth, lips or other oral mucosa - nerve damage due to positioning  - sore throat or hoarseness - Damage to heart, brain, nerves, lungs, other parts of body or loss of life  Patient voiced understanding and assent.)        Anesthesia Quick Evaluation

## 2023-09-24 NOTE — Transfer of Care (Signed)
 Immediate Anesthesia Transfer of Care Note  Patient: Tracy Mann  Procedure(s) Performed: PHACOEMULSIFICATION, CATARACT, WITH IOL INSERTION 6.94, 00:47.3 (Right)  Patient Location: PACU  Anesthesia Type: MAC  Level of Consciousness: awake, alert  and patient cooperative  Airway and Oxygen Therapy: Patient Spontanous Breathing and Patient connected to supplemental oxygen  Post-op Assessment: Post-op Vital signs reviewed, Patient's Cardiovascular Status Stable, Respiratory Function Stable, Patent Airway and No signs of Nausea or vomiting  Post-op Vital Signs: Reviewed and stable  Complications: No notable events documented.

## 2023-09-24 NOTE — Op Note (Signed)
 OPERATIVE NOTE  Tracy Mann [id number] 09/24/2023   PREOPERATIVE DIAGNOSIS:  Nuclear sclerotic cataract right eye.  H25.11   POSTOPERATIVE DIAGNOSIS:     Nuclear sclerotic cataract right eye.   Intraoperative floppy iris syndrome   PROCEDURE:  CPT [phone] Complex Phacoemusification with posterior chamber intraocular lens placement of the right eye, requiring malygin ring for dilation   LENS:   Implant Name Type Inv. Item Serial No. Manufacturer Lot No. LRB No. Used Action  LENS IOL TECNIS EYHANCE 24.0 - [id number] Intraocular Lens LENS IOL TECNIS EYHANCE 24.0 [id number] [employer]  Right 1 Implanted       Procedure(s): PHACOEMULSIFICATION, CATARACT, WITH IOL INSERTION 6.94, 00:47.3 (Right)  SURGEON:  Adine Novak, MD, MPH  ANESTHESIOLOGIST: Anesthesiologist: Leavy Ned, MD CRNA: Myra Lawless, CRNA   ANESTHESIA:  Topical with tetracaine  drops augmented with 1% preservative-free intracameral lidocaine .  ESTIMATED BLOOD LOSS: less than 1 mL.   COMPLICATIONS:  None.   DESCRIPTION OF PROCEDURE:  The patient was identified in the holding room and transported to the operating room and placed in the supine position under the operating microscope.  The right eye was identified as the operative eye and it was prepped and draped in the usual sterile ophthalmic fashion.   A 1.0 millimeter clear-corneal paracentesis was made at the 10:30 position. 0.5 ml of preservative-free 1% lidocaine  with epinephrine  was injected into the anterior chamber.  The anterior chamber was filled with viscoelastic.  A 2.4 millimeter keratome was used to make a near-clear corneal incision at the 8:00 position.    The pupil was small so a malyugin ring was placed.  A curvilinear capsulorrhexis was made with a cystotome and capsulorrhexis forceps.  Balanced salt solution was used to hydrodissect and hydrodelineate the nucleus.   Phacoemulsification was then used in stop and chop fashion to remove the  lens nucleus and epinucleus.  The remaining cortex was then removed using the irrigation and aspiration handpiece. Viscoelastic was then placed into the capsular bag to distend it for lens placement.  A lens was then injected into the capsular bag.  The malyugin ring was removed.  The remaining viscoelastic was aspirated.   Wounds were hydrated with balanced salt solution.  The anterior chamber was inflated to a physiologic pressure with balanced salt solution.   Intracameral vigamox  0.1 mL undiluted was injected into the eye and a drop placed onto the ocular surface.  No wound leaks were noted.  The patient was taken to the recovery room in stable condition without complications of anesthesia or surgery  Adine Novak 09/24/2023, 8:48 AM

## 2023-09-24 NOTE — H&P (Signed)
 [facility]   Primary Care Physician:  Bertrum Charlie CROME, MD Ophthalmologist: Dr. Adine Novak  Pre-Procedure History & Physical: HPI:  Tracy Mann is a 89 y.o. female here for cataract surgery.   Past Medical History:  Diagnosis Date   Acute myocardial infarction of inferior wall (HCC)    a. 10/2005 - Presumed to be 2/2 to a coronary embolus in setting of persistent Afib-->Cath 2014 relatively nl cors, EF 45-50% w/ severe distal inferior/apical inferior HK w/ aneursymal appearance.   Arthritis    Chronic anticoagulation    a. warfarin.   Chronic atrial fibrillation (HCC)    a. CHA2DS2VASc = 6--> warfarin.   Chronic combined systolic and diastolic CHF (congestive heart failure) (HCC)    a. 10/2012 Echo: EF 35-40%, diff HK, mild MR, mild biatrial enlargement;  b. 11/2012 EF 45-50% by LV gram; c. echo 07/2014: EF 40-45%, mod conc LVH, mod MR, severe biatrial enlargement, PASP 45 mm Hg c. Echo 01/2018 EF 45-50%, duffuse hypokinesis, mild MR, LA mod dilated, norm RVSF, dilated IVC   Chronic low back pain with bilateral sciatica    Essential hypertension    Hyperlipidemia, mixed    Mixed Ischemic/Nonischemic Cardiomyopathy    a. 10/2012 Echo: EF 35-40%;  b. 11/2012 EF 45-50% by LV gram.   Nonobstructive coronary artery disease    a. 2007 inferior wall MI thought to be secondary to thrombus from atrial fib b. 2014 cath with R dominant coronary arterial system with mild luminal irregularities c. Myoview  05/2015 old inferior MI, no new ischemic changes   Obesity    Stasis dermatitis of both legs    Thyroid  disease    hypothyroidism   Type 2 diabetes mellitus with stage 3b chronic kidney disease, with long-term current use of insulin  (HCC)    Type II diabetes mellitus (HCC)    a. 07/2018 A1c 8.7 - long term insulin  use   Venous insufficiency    Vitamin D deficiency     Past Surgical History:  Procedure Laterality Date   CARDIAC CATHETERIZATION  11/2012   [facility];EF 45-50%    CHOLECYSTECTOMY  1999   TUBAL LIGATION  1968   VESICOVAGINAL FISTULA CLOSURE W/ TAH  1996    Prior to Admission medications   Medication Sig Start Date End Date Taking? Authorizing Provider  apixaban  (ELIQUIS ) 2.5 MG TABS tablet Take 1 tablet (2.5 mg total) by mouth 2 (two) times daily. 07/10/23  Yes [name], Timothy J, MD  CHOLECALCIFEROL PO Take 1 tablet by mouth daily. Dosage unknown   Yes [provider]  ezetimibe  (ZETIA ) 10 MG tablet Take 1 tablet (10 mg total) by mouth daily. 07/26/21  Yes Bertrum Charlie CROME, MD  HYDROcodone -acetaminophen  (NORCO) 10-325 MG tablet Take 1 tablet by mouth every 8 (eight) hours as needed for severe pain (pain score 7-10). Must last 30 days. 07/20/23 09/24/23 Yes Patel, Seema K, NP  insulin  glargine (LANTUS  SOLOSTAR) 100 UNIT/ML Solostar Pen Inject 18-24 Units into the skin at bedtime. 05/20/19  Yes [provider]  insulin  lispro (HUMALOG ) 100 UNIT/ML KwikPen Inject 10-25 Units into the skin in the morning, at noon, and at bedtime. 10/12/20  Yes [provider]  levothyroxine  (SYNTHROID ) 25 MCG tablet Take 25 mcg by mouth daily before breakfast.   Yes [provider]  lisinopril  (ZESTRIL ) 10 MG tablet Take 1 tablet (10 mg total) by mouth daily. 12/14/22  Yes [name], Timothy J, MD  LORazepam  (ATIVAN ) 0.5 MG tablet Take 0.5 mg by mouth  every 8 (eight) hours as needed for anxiety. 10/19/22  Yes [provider]  Magnesium  500 MG CAPS Take 1,000 mg by mouth daily.    Yes [provider]  torsemide  (DEMADEX ) 20 MG tablet Take 2 tablets (40 mg total) by mouth daily. May take an additional 40 MG daily as needed at lunch for swelling. 07/30/23  Yes [name], Timothy J, MD  trimethoprim  (TRIMPEX ) 100 MG tablet Take 100 mg by mouth daily.   Yes [provider]  albuterol  (VENTOLIN  HFA) 108 (90 Base) MCG/ACT inhaler TAKE 2 PUFFS BY MOUTH EVERY 6 HOURS AS NEEDED FOR WHEEZE OR SHORTNESS OF BREATH Patient taking  differently: Inhale 2 puffs into the lungs every 6 (six) hours as needed for shortness of breath or wheezing. 03/19/20   Bertrum Charlie CROME, MD  flavoxATE (URISPAS) 100 MG tablet Take 1 tablet (100 mg total) by mouth 2 (two) times daily as needed for bladder spasms. 10/17/21   Gaston Hamilton, MD  fluticasone  (FLONASE ) 50 MCG/ACT nasal spray Place 2 sprays into both nostrils daily. 01/06/22   Cyndi Shaver, PA-C  hydrocortisone  cream 0.5 % Apply 1 Application topically 2 (two) times daily. 06/04/23   Clarine Ozell LABOR, MD  Insulin  Pen Needle (RELION PEN NEEDLE 31G/8MM) 31G X 8 MM MISC Use with insulin  pen three times daily 03/24/19   Gilbert, Richard L, MD  naloxone  (NARCAN ) nasal spray 4 mg/0.1 mL Place 1 spray into the nose as needed for up to 365 doses (for opioid-induced respiratory depresssion). In case of emergency (overdose), spray once into each nostril. If no response within 3 minutes, repeat application and call 911. 10/18/22 10/18/23  Tanya Glisson, MD  nitroGLYCERIN  (NITROSTAT ) 0.4 MG SL tablet Place 1 tablet (0.4 mg total) under the tongue every 5 (five) minutes as needed for chest pain. 04/20/23   [name], Timothy J, MD  ondansetron  (ZOFRAN ) 4 MG tablet Take 1 tablet (4 mg total) by mouth every 12 (twelve) hours as needed for nausea or vomiting. 07/26/21   Bertrum Charlie CROME, MD  triamcinolone  cream (KENALOG ) 0.1 % Apply twice daily to aa rash/itch or mosquito bite until smooth. Avoid applying to face, groin, and axilla. Use as directed. Long-term use can cause thinning of the skin. 09/04/23   Claudene Lehmann, MD    Allergies as of 09/10/2023 - Review Complete 09/04/2023  Allergen Reaction Noted   Clindamycin  Shortness Of Breath and Swelling 10/30/2022   Clindamycin /lincomycin Shortness Of Breath and Swelling 10/30/2022   Duloxetine  Other (See Comments) 11/13/2019   Paroxetine  Other (See Comments) 11/13/2019   Sulfa antibiotics Anaphylaxis 07/15/2012   Cephalexin  Other (See Comments)  04/28/2019   Clarithromycin Other (See Comments) 09/09/2013   Diphenhydramine Other (See Comments) 07/08/2014   Estrogens conjugated  07/08/2014   Estrogens conjugated  07/08/2014   Nitrofurantoin  Nausea Only 07/15/2012    Family History  Problem Relation Age of Onset   Heart disease Mother    Heart attack Mother    Thyroid  disease Father    Heart disease Brother    Heart disease Brother     Social History   Socioeconomic History   Marital status: Widowed    Spouse name: widowed   Number of children: 4   Years of education: 14   Highest education level: 12th grade  Occupational History   Occupation: retired  Tobacco Use   Smoking status: Never   Smokeless tobacco: Never  Vaping Use   Vaping status: Never Used  Substance and Sexual Activity  Alcohol use: No   Drug use: No   Sexual activity: Not Currently  Other Topics Concern   Not on file  Social History Narrative   Widowed   Has children and grandchildren   Lives in [place]   Social Drivers of Health   Financial Resource Strain: Low Risk  (07/19/2023)   Received from [facility]   Overall Financial Resource Strain (CARDIA)    Difficulty of Paying Living Expenses: Not hard at all  Food Insecurity: No Food Insecurity (07/20/2023)   Hunger Vital Sign    Worried About Running Out of Food in the Last Year: Never true    Ran Out of Food in the Last Year: Never true  Transportation Needs: No Transportation Needs (07/20/2023)   PRAPARE - Administrator, Civil Service (Medical): No    Lack of Transportation (Non-Medical): No  Physical Activity: Not on file  Stress: No Stress Concern Present (03/14/2017)   [employer] of Occupational Health - Occupational Stress Questionnaire    Feeling of Stress : Not at all  Social Connections: Moderately Isolated (07/20/2023)   Social Connection and Isolation Panel    Frequency of Communication with Friends and Family: Once a week     Frequency of Social Gatherings with Friends and Family: Once a week    Attends Religious Services: 1 to 4 times per year    Active Member of [employer] or Organizations: Yes    Attends Banker Meetings: 1 to 4 times per year    Marital Status: Widowed  Intimate Partner Violence: Not At Risk (07/20/2023)   Humiliation, Afraid, Rape, and Kick questionnaire    Fear of Current or Ex-Partner: No    Emotionally Abused: No    Physically Abused: No    Sexually Abused: No    Review of Systems: See HPI, otherwise negative ROS  Physical Exam: BP 111/65   Temp 97.7 F (36.5 C) (Temporal)   Resp 16   Ht 5' 2.99 (1.6 m)   Wt 85.7 kg   SpO2 94%   BMI 33.47 kg/m  General:   Alert, cooperative. Head:  Normocephalic and atraumatic. Respiratory:  Normal work of breathing. Cardiovascular:  NAD  Impression/Plan: RADIANCE DEADY is here for cataract surgery.  Risks, benefits, limitations, and alternatives regarding cataract surgery have been reviewed with the patient.  Questions have been answered.  All parties agreeable.   Adine Novak, MD  09/24/2023, 8:18 AM

## 2023-09-24 NOTE — Anesthesia Postprocedure Evaluation (Signed)
 Anesthesia Post Note  Patient: XAVIER FOURNIER  Procedure(s) Performed: PHACOEMULSIFICATION, CATARACT, WITH IOL INSERTION 6.94, 00:47.3 (Right)  Patient location during evaluation: PACU Anesthesia Type: MAC Level of consciousness: awake and alert Pain management: pain level controlled Vital Signs Assessment: post-procedure vital signs reviewed and stable Respiratory status: spontaneous breathing, nonlabored ventilation, respiratory function stable and patient connected to nasal cannula oxygen Cardiovascular status: stable and blood pressure returned to baseline Postop Assessment: no apparent nausea or vomiting Anesthetic complications: no   No notable events documented.   Last Vitals:  Vitals:   09/24/23 0855 09/24/23 0856  BP: (!) 85/53 (!) 86/51  Pulse: 73 76  Resp: 18 (!) 23  Temp:    SpO2: 96% 94%    Last Pain:  Vitals:   09/24/23 0856  TempSrc:   PainSc: 0-No pain                 Debby Mines

## 2023-10-03 ENCOUNTER — Encounter: Payer: Self-pay | Admitting: Ophthalmology

## 2023-10-03 NOTE — Anesthesia Preprocedure Evaluation (Addendum)
 Anesthesia Evaluation  Patient identified by MRN, date of birth, ID band Patient awake    Reviewed: Allergy & Precautions, H&P , NPO status , Patient's Chart, lab work & pertinent test results  Airway Mallampati: III  TM Distance: >3 FB Neck ROM: Full    Dental  (+) Lower Dentures, Upper Dentures   Pulmonary neg pulmonary ROS, shortness of breath, asthma , sleep apnea , pneumonia   Pulmonary exam normal breath sounds clear to auscultation       Cardiovascular hypertension, + angina  + CAD, + Past MI, + Peripheral Vascular Disease and +CHF  Normal cardiovascular exam Rhythm:Irregular Rate:Normal  05-19-10 echo 1. Left ventricular ejection fraction, by estimation, is 30 to 35%. The  left ventricle has moderate to severely decreased function. The left  ventricle demonstrates global hypokinesis. The left ventricular internal  cavity size was mildly dilated. Left  ventricular diastolic parameters are indeterminate.   2. Right ventricular systolic function is low normal. The right  ventricular size is moderately enlarged. There is moderately elevated  pulmonary artery systolic pressure. The estimated right ventricular  systolic pressure is 56.0 mmHg.   3. Left atrial size was severely dilated.   4. Right atrial size was severely dilated.   5. The mitral valve is degenerative. Moderate to severe mitral valve  regurgitation.   6. Tricuspid valve regurgitation is mild to moderate.   7. The aortic valve was not well visualized. Aortic valve regurgitation  is not visualized.   8. The inferior vena cava is dilated in size with <50% respiratory  variability, suggesting right atrial pressure of 15 mmHg.   Comparison(s): LVEF 30-35%, Moderate MR.     Neuro/Psych  PSYCHIATRIC DISORDERS Anxiety Depression     Neuromuscular disease negative neurological ROS  negative psych ROS   GI/Hepatic negative GI ROS, Neg liver ROS,GERD  ,,   Endo/Other  diabetesHypothyroidism    Renal/GU Renal diseasenegative Renal ROS  negative genitourinary   Musculoskeletal negative musculoskeletal ROS (+) Arthritis ,    Abdominal   Peds negative pediatric ROS (+)  Hematology negative hematology ROS (+)   Anesthesia Other Findings Previous cataract 09-24-23 Dr. Leavy Removed left hearing aid preop, placed in cup and given to her daughter Past Medical History: No date: Acute myocardial infarction of inferior wall [facility])     Comment:  a. 10/2005 - Presumed to be 2/2 to a coronary embolus in              setting of persistent Afib-->Cath 2014 relatively nl               cors, EF 45-50% w/ severe distal inferior/apical inferior              HK w/ aneursymal appearance. No date: Arthritis No date: Chronic anticoagulation     Comment:  a. warfarin. No date: Chronic atrial fibrillation (HCC)     Comment:  a. CHA2DS2VASc = 6--> warfarin. No date: Chronic combined systolic and diastolic CHF (congestive  heart failure) (HCC)     Comment:  a. 10/2012 Echo: EF 35-40%, diff HK, mild MR, mild               biatrial enlargement;  b. 11/2012 EF 45-50% by LV gram;               c. echo 07/2014: EF 40-45%, mod conc LVH, mod MR, severe               biatrial enlargement, PASP 45  mm Hg c. Echo 01/2018 EF               45-50%, duffuse hypokinesis, mild MR, LA mod dilated,               norm RVSF, dilated IVC No date: Chronic low back pain with bilateral sciatica No date: Essential hypertension No date: Hyperlipidemia, mixed No date: Mixed Ischemic/Nonischemic Cardiomyopathy     Comment:  a. 10/2012 Echo: EF 35-40%;  b. 11/2012 EF 45-50% by LV               gram. No date: Nonobstructive coronary artery disease     Comment:  a. 2007 inferior wall MI thought to be secondary to thrombus from atrial fib b. 2014 cath with R dominant               coronary arterial system with mild luminal irregularities              c. Myoview  05/2015 old inferior  MI, no new ischemic               changes No date: Obesity No date: Stasis dermatitis of both legs No date: Thyroid  disease     Comment:  hypothyroidism No date: Type 2 diabetes mellitus with stage 3b chronic kidney  disease, with long-term current use of insulin  (HCC) No date: Type II diabetes mellitus (HCC)     Comment:  a. 07/2018 A1c 8.7 - long term insulin  use No date: Venous insufficiency No date: Vitamin D deficiency  BMI    Chronic atrial fibrillation (HCC) Chronic combined systolic and diastolic CHF (congestive heart failure) (HCC) Chronic anticoagulation  Acute myocardial infarction of inferior wall  Essential hypertension  Obesity Hyperlipidemia, mixed  Type II diabetes mellitus (HCC) Mixed Ischemic/Nonischemic Cardiomyopathy  Thyroid  disease Vitamin D deficiency  Venous insufficiency Arthritis Nonobstructive coronary artery disease Type 2 diabetes mellitus with stage 3b chronic kidney disease, with long-term current use of insulin  (HCC)  Stasis dermatitis of both legs Chronic low back pain with bilateral sciatica     Reproductive/Obstetrics negative OB ROS                              Anesthesia Physical Anesthesia Plan  ASA: 3  Anesthesia Plan: MAC   Post-op Pain Management:    Induction: Intravenous  PONV Risk Score and Plan:   Airway Management Planned: Natural Airway and Nasal Cannula  Additional Equipment:   Intra-op Plan:   Post-operative Plan:   Informed Consent: I have reviewed the patients History and Physical, chart, labs and discussed the procedure including the risks, benefits and alternatives for the proposed anesthesia with the patient or authorized representative who has indicated his/her understanding and acceptance.     Dental Advisory Given  Plan Discussed with: Anesthesiologist, CRNA and Surgeon  Anesthesia Plan Comments: (Patient consented for risks of anesthesia including but not limited to:  -  adverse reactions to medications - damage to eyes, teeth, lips or other oral mucosa - nerve damage due to positioning  - sore throat or hoarseness - Damage to heart, brain, nerves, lungs, other parts of body or loss of life  Patient voiced understanding and assent.)         Anesthesia Quick Evaluation

## 2023-10-04 NOTE — Discharge Instructions (Signed)

## 2023-10-08 ENCOUNTER — Ambulatory Visit: Payer: Self-pay | Admitting: Anesthesiology

## 2023-10-08 ENCOUNTER — Other Ambulatory Visit: Payer: Self-pay

## 2023-10-08 ENCOUNTER — Ambulatory Visit
Admission: RE | Admit: 2023-10-08 | Discharge: 2023-10-08 | Disposition: A | Discharge status: Home / Self Care | Attending: Ophthalmology | Admitting: Ophthalmology

## 2023-10-08 ENCOUNTER — Encounter: Payer: Self-pay | Admitting: Ophthalmology

## 2023-10-08 ENCOUNTER — Encounter
Admission: RE | Disposition: A | Discharge status: Home / Self Care | Payer: Self-pay | Source: Home / Self Care | Attending: Ophthalmology

## 2023-10-08 DIAGNOSIS — I252 Old myocardial infarction: Secondary | ICD-10-CM | POA: Diagnosis not present

## 2023-10-08 DIAGNOSIS — I130 Hypertensive heart and chronic kidney disease with heart failure and stage 1 through stage 4 chronic kidney disease, or unspecified chronic kidney disease: Secondary | ICD-10-CM | POA: Insufficient documentation

## 2023-10-08 DIAGNOSIS — I5042 Chronic combined systolic (congestive) and diastolic (congestive) heart failure: Secondary | ICD-10-CM | POA: Insufficient documentation

## 2023-10-08 DIAGNOSIS — E1122 Type 2 diabetes mellitus with diabetic chronic kidney disease: Secondary | ICD-10-CM | POA: Diagnosis not present

## 2023-10-08 DIAGNOSIS — F419 Anxiety disorder, unspecified: Secondary | ICD-10-CM | POA: Diagnosis not present

## 2023-10-08 DIAGNOSIS — F32A Depression, unspecified: Secondary | ICD-10-CM | POA: Insufficient documentation

## 2023-10-08 DIAGNOSIS — M1990 Unspecified osteoarthritis, unspecified site: Secondary | ICD-10-CM | POA: Insufficient documentation

## 2023-10-08 DIAGNOSIS — E039 Hypothyroidism, unspecified: Secondary | ICD-10-CM | POA: Insufficient documentation

## 2023-10-08 DIAGNOSIS — H2181 Floppy iris syndrome: Secondary | ICD-10-CM | POA: Diagnosis not present

## 2023-10-08 DIAGNOSIS — Z794 Long term (current) use of insulin: Secondary | ICD-10-CM | POA: Diagnosis not present

## 2023-10-08 DIAGNOSIS — E1151 Type 2 diabetes mellitus with diabetic peripheral angiopathy without gangrene: Secondary | ICD-10-CM | POA: Insufficient documentation

## 2023-10-08 DIAGNOSIS — J45909 Unspecified asthma, uncomplicated: Secondary | ICD-10-CM | POA: Insufficient documentation

## 2023-10-08 DIAGNOSIS — E1136 Type 2 diabetes mellitus with diabetic cataract: Secondary | ICD-10-CM | POA: Diagnosis present

## 2023-10-08 DIAGNOSIS — H2512 Age-related nuclear cataract, left eye: Secondary | ICD-10-CM | POA: Insufficient documentation

## 2023-10-08 DIAGNOSIS — G4730 Sleep apnea, unspecified: Secondary | ICD-10-CM | POA: Insufficient documentation

## 2023-10-08 DIAGNOSIS — I25119 Atherosclerotic heart disease of native coronary artery with unspecified angina pectoris: Secondary | ICD-10-CM | POA: Diagnosis not present

## 2023-10-08 DIAGNOSIS — N1832 Chronic kidney disease, stage 3b: Secondary | ICD-10-CM | POA: Insufficient documentation

## 2023-10-08 HISTORY — DX: Presence of dental prosthetic device (complete) (partial): Z97.2

## 2023-10-08 HISTORY — DX: Personal history of other medical treatment: Z92.89

## 2023-10-08 HISTORY — PX: CATARACT EXTRACTION W/PHACO: SHX586

## 2023-10-08 HISTORY — DX: Spinal stenosis, lumbar region without neurogenic claudication: M48.061

## 2023-10-08 HISTORY — DX: Nonrheumatic mitral (valve) insufficiency: I34.0

## 2023-10-08 LAB — GLUCOSE, CAPILLARY: Glucose-Capillary: 141 mg/dL — ABNORMAL HIGH (ref 70–99)

## 2023-10-08 SURGERY — PHACOEMULSIFICATION, CATARACT, WITH IOL INSERTION
Anesthesia: Monitor Anesthesia Care | Site: Eye | Laterality: Left

## 2023-10-08 MED ORDER — SIGHTPATH DOSE#1 NA HYALUR & NA CHOND-NA HYALUR IO KIT
PACK | INTRAOCULAR | Status: DC | PRN
  Administered 2023-10-08: 1 via OPHTHALMIC

## 2023-10-08 MED ORDER — TETRACAINE HCL 0.5 % OP SOLN
OPHTHALMIC | Status: AC
  Filled 2023-10-08: qty 4

## 2023-10-08 MED ORDER — ARMC OPHTHALMIC DILATING DROPS
1.0000 | OPHTHALMIC | Status: DC | PRN
  Administered 2023-10-08 (×3): 1 via OPHTHALMIC

## 2023-10-08 MED ORDER — LIDOCAINE HCL (PF) 2 % IJ SOLN
INTRAOCULAR | Status: DC | PRN
  Administered 2023-10-08: 4 mL via INTRAOCULAR

## 2023-10-08 MED ORDER — MOXIFLOXACIN HCL 0.5 % OP SOLN
OPHTHALMIC | Status: DC | PRN
  Administered 2023-10-08: .2 mL via OPHTHALMIC

## 2023-10-08 MED ORDER — SIGHTPATH DOSE#1 BSS IO SOLN
INTRAOCULAR | Status: DC | PRN
  Administered 2023-10-08: 15 mL via INTRAOCULAR

## 2023-10-08 MED ORDER — TETRACAINE HCL 0.5 % OP SOLN
1.0000 [drp] | OPHTHALMIC | Status: DC | PRN
  Administered 2023-10-08 (×3): 1 [drp] via OPHTHALMIC

## 2023-10-08 MED ORDER — ARMC OPHTHALMIC DILATING DROPS
OPHTHALMIC | Status: AC
  Filled 2023-10-08: qty 0.5

## 2023-10-08 MED ORDER — SIGHTPATH DOSE#1 BSS IO SOLN
INTRAOCULAR | Status: DC | PRN
  Administered 2023-10-08: 101 mL via OPHTHALMIC

## 2023-10-08 MED ORDER — FENTANYL CITRATE (PF) 100 MCG/2ML IJ SOLN
INTRAMUSCULAR | Status: DC | PRN
  Administered 2023-10-08: 50 ug via INTRAVENOUS

## 2023-10-08 MED ORDER — FENTANYL CITRATE (PF) 100 MCG/2ML IJ SOLN
INTRAMUSCULAR | Status: AC
  Filled 2023-10-08: qty 2

## 2023-10-08 SURGICAL SUPPLY — 10 items
DISSECTOR HYDRO NUCLEUS 50X22 (MISCELLANEOUS) ×1 IMPLANT
FEE CATARACT SUITE SIGHTPATH (MISCELLANEOUS) ×1 IMPLANT
GLOVE PI ULTRA LF STRL 7.5 (GLOVE) ×1 IMPLANT
GLOVE SURG SYN 6.5 PF PI BL (GLOVE) ×1 IMPLANT
GLOVE SURG SYN 8.5 PF PI BL (GLOVE) ×1 IMPLANT
LENS IOL TECNIS EYHANCE 24.0 (Intraocular Lens) IMPLANT
NDL FILTER BLUNT 18X1 1/2 (NEEDLE) ×1 IMPLANT
NEEDLE FILTER BLUNT 18X1 1/2 (NEEDLE) ×1 IMPLANT
RING MALYGIN (MISCELLANEOUS) IMPLANT
SYR 3ML LL SCALE MARK (SYRINGE) ×1 IMPLANT

## 2023-10-08 NOTE — H&P (Signed)
 [facility]   Primary Care Physician:  Bertrum Charlie CROME, MD Ophthalmologist: Dr. Adine Novak  Pre-Procedure History & Physical: HPI:  Tracy Mann is a 89 y.o. female here for cataract surgery.   Past Medical History:  Diagnosis Date   Acute myocardial infarction of inferior wall (HCC)    a. 10/2005 - Presumed to be 2/2 to a coronary embolus in setting of persistent Afib-->Cath 2014 relatively nl cors, EF 45-50% w/ severe distal inferior/apical inferior HK w/ aneursymal appearance.   Arthritis    Chronic anticoagulation    a. warfarin.   Chronic atrial fibrillation (HCC)    a. CHA2DS2VASc = 6--> warfarin.   Chronic combined systolic and diastolic CHF (congestive heart failure) (HCC)estimated right ventricular  systolic pressure is 56.0 mmHg.   3. Left atrial size severely dilated, Right atrial size severely dilated. Moderate to severe MR, mild TR    1. EF 30 to 35%. LV moderate to severely decreased function. LV demonstrates global hypokinesis. LV internal  cavity size mildly dilated. LV diastolic parameters are indeterminate.   2. RV systolic function low normal. RV moderately enlarged. moderately elevated  PASP.   Chronic low back pain with bilateral sciatica    Essential hypertension    Hyperlipidemia, mixed    Mixed Ischemic/Nonischemic Cardiomyopathy    a. 10/2012 Echo: EF 35-40%;  b. 11/2012 EF 45-50% by LV gram.   Moderate to severe mitral regurgitation    Nonobstructive coronary artery disease    a. 2007 inferior wall MI thought to be secondary to thrombus from atrial fib b. 2014 cath with R dominant coronary arterial system with mild luminal irregularities c. Myoview  05/2015 old inferior MI, no new ischemic changes   Obesity    Spinal stenosis, lumbar    Stasis dermatitis of both legs    Thyroid  disease    hypothyroidism   Type 2 diabetes mellitus with stage 3b chronic kidney disease, with long-term current use of insulin  (HCC)    Type II diabetes mellitus (HCC)     a. 07/2018 A1c 8.7 - long term insulin  use   Venous insufficiency    Vitamin D deficiency     Past Surgical History:  Procedure Laterality Date   CARDIAC CATHETERIZATION  11/2012   [facility];EF 45-50%   CATARACT EXTRACTION W/PHACO Right 09/24/2023   Procedure: PHACOEMULSIFICATION, CATARACT, WITH IOL INSERTION 6.94, 00:47.3;  Surgeon: Novak Adine Anes, MD;  Location: [facility];  Service: Ophthalmology;  Laterality: Right;   CHOLECYSTECTOMY  1999   TUBAL LIGATION  1968   VESICOVAGINAL FISTULA CLOSURE W/ TAH  1996    Prior to Admission medications   Medication Sig Start Date End Date Taking? Authorizing Provider  apixaban  (ELIQUIS ) 2.5 MG TABS tablet Take 1 tablet (2.5 mg total) by mouth 2 (two) times daily. 07/10/23  Yes [name], Timothy J, MD  CHOLECALCIFEROL PO Take 1 tablet by mouth daily. Dosage unknown   Yes [provider]  ezetimibe  (ZETIA ) 10 MG tablet Take 1 tablet (10 mg total) by mouth daily. 07/26/21  Yes Bertrum Charlie CROME, MD  fluticasone  (FLONASE ) 50 MCG/ACT nasal spray Place 2 sprays into both nostrils daily. 01/06/22  Yes Drubel, Manuelita, PA-C  HYDROcodone -acetaminophen  (NORCO) 10-325 MG tablet Take 1 tablet by mouth every 8 (eight) hours as needed for severe pain (pain score 7-10). Must last 30 days. 07/20/23 10/08/23 Yes Patel, Seema K, NP  insulin  glargine (LANTUS  SOLOSTAR) 100 UNIT/ML Solostar Pen Inject 18-24 Units into the skin at bedtime. 05/20/19  Yes [provider]  insulin  lispro (HUMALOG ) 100 UNIT/ML KwikPen Inject 10-25 Units into the skin in the morning, at noon, and at bedtime. 10/12/20  Yes [provider]  lisinopril  (ZESTRIL ) 10 MG tablet Take 1 tablet (10 mg total) by mouth daily. 12/14/22  Yes [name], Timothy J, MD  LORazepam  (ATIVAN ) 0.5 MG tablet Take 0.5 mg by mouth every 8 (eight) hours as needed for anxiety. 10/19/22  Yes [provider]  Magnesium  500 MG CAPS Take 1,000 mg by mouth daily.    Yes [provider]  torsemide  (DEMADEX ) 20 MG tablet Take 2 tablets (40 mg total) by mouth daily. May take an additional 40 MG daily as needed at lunch for swelling. 07/30/23  Yes [name], Timothy J, MD  trimethoprim  (TRIMPEX ) 100 MG tablet Take 100 mg by mouth daily.   Yes [provider]  albuterol  (VENTOLIN  HFA) 108 (90 Base) MCG/ACT inhaler TAKE 2 PUFFS BY MOUTH EVERY 6 HOURS AS NEEDED FOR WHEEZE OR SHORTNESS OF BREATH Patient taking differently: Inhale 2 puffs into the lungs every 6 (six) hours as needed for shortness of breath or wheezing. 03/19/20   Bertrum Charlie CROME, MD  flavoxATE (URISPAS) 100 MG tablet Take 1 tablet (100 mg total) by mouth 2 (two) times daily as needed for bladder spasms. 10/17/21   Gaston Hamilton, MD  hydrocortisone  cream 0.5 % Apply 1 Application topically 2 (two) times daily. 06/04/23   Clarine Ozell LABOR, MD  Insulin  Pen Needle (RELION PEN NEEDLE 31G/8MM) 31G X 8 MM MISC Use with insulin  pen three times daily 03/24/19   Gilbert, Richard L, MD  levothyroxine  (SYNTHROID ) 25 MCG tablet Take 25 mcg by mouth daily before breakfast.    [provider]  naloxone  (NARCAN ) nasal spray 4 mg/0.1 mL Place 1 spray into the nose as needed for up to 365 doses (for opioid-induced respiratory depresssion). In case of emergency (overdose), spray once into each nostril. If no response within 3 minutes, repeat application and call 911. 10/18/22 10/18/23  Tanya Glisson, MD  nitroGLYCERIN  (NITROSTAT ) 0.4 MG SL tablet Place 1 tablet (0.4 mg total) under the tongue every 5 (five) minutes as needed for chest pain. 04/20/23   [name], Timothy J, MD  ondansetron  (ZOFRAN ) 4 MG tablet Take 1 tablet (4 mg total) by mouth every 12 (twelve) hours as needed for nausea or vomiting. 07/26/21   Bertrum Charlie CROME, MD  triamcinolone  cream (KENALOG ) 0.1 % Apply twice daily to aa rash/itch or mosquito bite until smooth. Avoid applying to face, groin, and axilla. Use as directed. Long-term use can cause  thinning of the skin. 09/04/23   Claudene Lehmann, MD    Allergies as of 09/10/2023 - Review Complete 09/04/2023  Allergen Reaction Noted   Clindamycin  Shortness Of Breath and Swelling 10/30/2022   Clindamycin /lincomycin Shortness Of Breath and Swelling 10/30/2022   Duloxetine  Other (See Comments) 11/13/2019   Paroxetine  Other (See Comments) 11/13/2019   Sulfa antibiotics Anaphylaxis 07/15/2012   Cephalexin  Other (See Comments) 04/28/2019   Clarithromycin Other (See Comments) 09/09/2013   Diphenhydramine Other (See Comments) 07/08/2014   Estrogens conjugated  07/08/2014   Estrogens conjugated  07/08/2014   Nitrofurantoin  Nausea Only 07/15/2012    Family History  Problem Relation Age of Onset   Heart disease Mother    Heart attack Mother    Thyroid  disease Father    Heart disease Brother    Heart disease Brother     Social History   Socioeconomic History  Marital status: Widowed    Spouse name: widowed   Number of children: 4   Years of education: 14   Highest education level: 12th grade  Occupational History   Occupation: retired  Tobacco Use   Smoking status: Never   Smokeless tobacco: Never  Vaping Use   Vaping status: Never Used  Substance and Sexual Activity   Alcohol use: No   Drug use: No   Sexual activity: Not Currently  Other Topics Concern   Not on file  Social History Narrative   Widowed   Has children and grandchildren   Lives in [place]   Social Drivers of Health   Financial Resource Strain: Low Risk  (07/19/2023)   Received from [facility]   Overall Financial Resource Strain (CARDIA)    Difficulty of Paying Living Expenses: Not hard at all  Food Insecurity: No Food Insecurity (07/20/2023)   Hunger Vital Sign    Worried About Running Out of Food in the Last Year: Never true    Ran Out of Food in the Last Year: Never true  Transportation Needs: No Transportation Needs (07/20/2023)   PRAPARE - Scientist, research (physical sciences) (Medical): No    Lack of Transportation (Non-Medical): No  Physical Activity: Not on file  Stress: No Stress Concern Present (03/14/2017)   [employer] of Occupational Health - Occupational Stress Questionnaire    Feeling of Stress : Not at all  Social Connections: Moderately Isolated (07/20/2023)   Social Connection and Isolation Panel    Frequency of Communication with Friends and Family: Once a week    Frequency of Social Gatherings with Friends and Family: Once a week    Attends Religious Services: 1 to 4 times per year    Active Member of [employer] or Organizations: Yes    Attends Banker Meetings: 1 to 4 times per year    Marital Status: Widowed  Intimate Partner Violence: Not At Risk (07/20/2023)   Humiliation, Afraid, Rape, and Kick questionnaire    Fear of Current or Ex-Partner: No    Emotionally Abused: No    Physically Abused: No    Sexually Abused: No    Review of Systems: See HPI, otherwise negative ROS  Physical Exam: BP (!) 106/49   Pulse 77   Temp (!) 97.5 F (36.4 C) (Temporal)   Ht 5' 2.99 (1.6 m)   Wt 85.9 kg   SpO2 93%   BMI 33.54 kg/m  General:   Alert, cooperative. Head:  Normocephalic and atraumatic. Respiratory:  Normal work of breathing. Cardiovascular:  NAD  Impression/Plan: Tracy Mann is here for cataract surgery.  Risks, benefits, limitations, and alternatives regarding cataract surgery have been reviewed with the patient.  Questions have been answered.  All parties agreeable.   Adine Novak, MD  10/08/2023, 9:39 AM

## 2023-10-08 NOTE — Anesthesia Postprocedure Evaluation (Signed)
 Anesthesia Post Note  Patient: Tracy Mann  Procedure(s) Performed: PHACOEMULSIFICATION, CATARACT, WITH IOL INSERTION 5.62 00:40.9 (Left: Eye)  Patient location during evaluation: PACU Anesthesia Type: MAC Level of consciousness: awake and alert Pain management: pain level controlled Vital Signs Assessment: post-procedure vital signs reviewed and stable Respiratory status: spontaneous breathing, nonlabored ventilation, respiratory function stable and patient connected to nasal cannula oxygen Cardiovascular status: stable and blood pressure returned to baseline Postop Assessment: no apparent nausea or vomiting Anesthetic complications: no   No notable events documented.   Last Vitals:  Vitals:   10/08/23 1013 10/08/23 1018  BP: (!) 97/59 (!) 98/55  Pulse: 70 76  Resp: 18 18  Temp: 36.4 C 36.6 C  SpO2: 98% 96%    Last Pain:  Vitals:   10/08/23 1018  TempSrc:   PainSc: 0-No pain                 Naima Veldhuizen C Agness Sibrian

## 2023-10-08 NOTE — Transfer of Care (Signed)
 Immediate Anesthesia Transfer of Care Note  Patient: Tracy Mann  Procedure(s) Performed: PHACOEMULSIFICATION, CATARACT, WITH IOL INSERTION 5.62 00:40.9 (Left: Eye)  Patient Location: PACU  Anesthesia Type: MAC  Level of Consciousness: awake, alert  and patient cooperative  Airway and Oxygen Therapy: Patient Spontanous Breathing and Patient connected to supplemental oxygen  Post-op Assessment: Post-op Vital signs reviewed, Patient's Cardiovascular Status Stable, Respiratory Function Stable, Patent Airway and No signs of Nausea or vomiting  Post-op Vital Signs: Reviewed and stable  Complications: No notable events documented.

## 2023-10-08 NOTE — Op Note (Addendum)
 OPERATIVE NOTE  Tracy Mann [id number] 10/08/2023   PREOPERATIVE DIAGNOSIS:  Nuclear sclerotic cataract left eye.  H25.12   POSTOPERATIVE DIAGNOSIS:      Nuclear sclerotic cataract left eye.   Intraoperative floppy iris syndrome   PROCEDURE:   CPT [phone] Complex Phacoemusification with posterior chamber intraocular lens placement of the left eye, requiring Malyugin ring for dilation and stabilization of the floppy iris  LENS:   Implant Name Type Inv. Item Serial No. Manufacturer Lot No. LRB No. Used Action  LENS IOL TECNIS EYHANCE 24.0 - [id number] Intraocular Lens LENS IOL TECNIS EYHANCE 24.0 [id number] SIGHTPATH  Left 1 Implanted      Procedure(s): PHACOEMULSIFICATION, CATARACT, WITH IOL INSERTION 5.62 00:40.9 (Left)  SURGEON:  Adine Novak, MD, MPH   ANESTHESIA:  Topical with tetracaine  drops augmented with 1% preservative-free intracameral lidocaine .  ESTIMATED BLOOD LOSS: <1 mL   COMPLICATIONS:  None.   DESCRIPTION OF PROCEDURE:  The patient was identified in the holding room and transported to the operating room and placed in the supine position under the operating microscope.  The left eye was identified as the operative eye and it was prepped and draped in the usual sterile ophthalmic fashion.   A 1.0 millimeter clear-corneal paracentesis was made at the 5:00 position. 0.5 ml of preservative-free 1% lidocaine  with epinephrine  was injected into the anterior chamber.  The anterior chamber was filled with viscoelastic.  A 2.4 millimeter keratome was used to make a near-clear corneal incision at the 2:00 position.    The pupil was small so a malyugin ring was placed without difficulty.  A curvilinear capsulorrhexis was made with a cystotome and capsulorrhexis forceps.  Balanced salt solution was used to hydrodissect and hydrodelineate the nucleus.   Phacoemulsification was then used in stop and chop fashion to remove the lens nucleus and epinucleus.  The remaining cortex  was then removed using the irrigation and aspiration handpiece. Viscoelastic was then placed into the capsular bag to distend it for lens placement.  A lens was then injected into the capsular bag.  The ring was removed. The remaining viscoelastic was aspirated.   Wounds were hydrated with balanced salt solution.  The anterior chamber was inflated to a physiologic pressure with balanced salt solution.  Intracameral vigamox  0.1 mL undiltued was injected into the eye and a drop placed onto the ocular surface.  No wound leaks were noted.  The patient was taken to the recovery room in stable condition without complications of anesthesia or surgery  Adine Novak 10/08/2023, 10:12 AM

## 2023-10-11 ENCOUNTER — Encounter: Payer: Self-pay | Admitting: Nurse Practitioner

## 2023-10-11 ENCOUNTER — Ambulatory Visit: Attending: Nurse Practitioner | Admitting: Nurse Practitioner

## 2023-10-11 DIAGNOSIS — M79604 Pain in right leg: Secondary | ICD-10-CM

## 2023-10-11 DIAGNOSIS — M79605 Pain in left leg: Secondary | ICD-10-CM | POA: Diagnosis not present

## 2023-10-11 DIAGNOSIS — M25552 Pain in left hip: Secondary | ICD-10-CM

## 2023-10-11 DIAGNOSIS — M25551 Pain in right hip: Secondary | ICD-10-CM | POA: Diagnosis not present

## 2023-10-11 DIAGNOSIS — M5442 Lumbago with sciatica, left side: Secondary | ICD-10-CM | POA: Diagnosis present

## 2023-10-11 DIAGNOSIS — G8929 Other chronic pain: Secondary | ICD-10-CM | POA: Insufficient documentation

## 2023-10-11 DIAGNOSIS — M5441 Lumbago with sciatica, right side: Secondary | ICD-10-CM

## 2023-10-11 DIAGNOSIS — Z79899 Other long term (current) drug therapy: Secondary | ICD-10-CM | POA: Insufficient documentation

## 2023-10-11 DIAGNOSIS — Z79891 Long term (current) use of opiate analgesic: Secondary | ICD-10-CM

## 2023-10-11 DIAGNOSIS — M25562 Pain in left knee: Secondary | ICD-10-CM | POA: Diagnosis present

## 2023-10-11 DIAGNOSIS — M47817 Spondylosis without myelopathy or radiculopathy, lumbosacral region: Secondary | ICD-10-CM

## 2023-10-11 DIAGNOSIS — M25561 Pain in right knee: Secondary | ICD-10-CM

## 2023-10-11 DIAGNOSIS — G894 Chronic pain syndrome: Secondary | ICD-10-CM | POA: Diagnosis present

## 2023-10-11 DIAGNOSIS — M549 Dorsalgia, unspecified: Secondary | ICD-10-CM | POA: Diagnosis present

## 2023-10-11 MED ORDER — HYDROCODONE-ACETAMINOPHEN 10-325 MG PO TABS: 1.0000 | ORAL_TABLET | Freq: Three times a day (TID) | ORAL | 0 refills | Status: DC | PRN

## 2023-10-11 NOTE — Progress Notes (Signed)
 Nursing Pain Medication Assessment:  Safety precautions to be maintained throughout the outpatient stay will include: orient to surroundings, keep bed in low position, maintain call bell within reach at all times, provide assistance with transfer out of bed and ambulation.  Medication Inspection Compliance: Pill count conducted under aseptic conditions, in front of the patient. Neither the pills nor the bottle was removed from the patient's sight at any time. Once count was completed pills were immediately returned to the patient in their original bottle.  Medication: Hydrocodone /APAP Pill/Patch Count: 86 of 90 pills/patches remain Pill/Patch Appearance: Markings consistent with prescribed medication Bottle Appearance: Standard pharmacy container. Clearly labeled. Filled Date: 08 / 20 / 2025 Last Medication intake:  Today

## 2023-10-11 NOTE — Progress Notes (Signed)
 PROVIDER NOTE: Interpretation of information contained herein should be left to medically-trained personnel. Specific patient instructions are provided elsewhere under Patient Instructions section of medical record. This document was created in part using AI and STT-dictation technology, any transcriptional errors that may result from this process are unintentional.  Patient: Tracy Mann  Service: E/M   PCP: Bertrum Charlie CROME, MD  DOB: December 28, 1933  DOS: 10/11/2023  Provider: Emmy MARLA Blanch, NP  MRN: [id number]  Delivery: Face-to-face  Specialty: Interventional Pain Management  Type: Established Patient  Setting: Ambulatory outpatient facility  Specialty designation: 09  Referring Prov.: Bertrum Charlie CROME, MD  Location: Outpatient office facility       History of present illness (HPI) Tracy Mann, a 89 y.o. year old female, is here today because of her No primary diagnosis found.. [name]'s primary complain today is Leg Pain (bilateral)  Pertinent problems: Ms. Cozad has Cervical muscle strain; DDD (degenerative disc disease), Lumbosacral; chronic venous insufficiency; Lumbar facet hypertrophy (Multilevel) (Bilateral); Lumbar central spinal stenosis (Severe) (L3-L4) with neurogenic claudication; Lumbosacral radiculopathy at L5 (Bilateral); Osteoarthritis of knee (Right); cellulitis of bilateral legs and Chronic pain syndrome on their pertinent problem list.   Pain Assessment: Severity of Chronic pain is reported as a 7 /10. Location: Leg Right, Left/radiates to feet and ankles. Onset: More than a month ago. Quality: Aching. Timing: Constant. Modifying factor(s): medications. Vitals:  height is 5' 3 (1.6 m) and weight is 180 lb (81.6 kg). Her temperature is 97.6 F (36.4 C). Her blood pressure is 109/80 and her pulse is 93. Her respiration is 18 and oxygen saturation is 98%.  BMI: Estimated body mass index is 31.89 kg/m as calculated from the following:   Height as of this encounter: 5' 3  (1.6 m).   Weight as of this encounter: 180 lb (81.6 kg).  Last encounter: 07/18/2023. Last procedure: Visit date not found.  Reason for encounter: medication management. The patient indicates doing well with current medication regimen. No adverse reaction or side effects reported to medication. The patient reports increased pain in her bilateral lower extremities due to cellulitis and arthritis.  She experience more intense pain, particularly in the middle of the day.  As a result, she has occasionally taken Norco 3 times a day instead of prescribed 2 times a day to help manage her symptoms.   The patient recently underwent a cataract surgery performed by ophthalmologist Dr. Adine Oneil Novak and is currently recovering from the procedure.  She reports swelling in her left leg, particularly around the knee, accompanied by numbness described as a not feeling sensation.  Additionally, she presents with the scattered pink scaly patches on the left hand around the wrist which she has covered with gauze.  Pharmacotherapy Assessment   Analgesic: Hydrocodone -acetaminophen  (Norco) 10-325 mg tablet every 8 hours as needed for pain. MME=20 Monitoring: [place] PMP: PDMP reviewed during this encounter.       Pharmacotherapy: No side-effects or adverse reactions reported. Compliance: No problems identified. Effectiveness: Clinically acceptable.  Rebecka Wolm CHRISTELLA, RN  10/11/2023  1:38 PM  Sign when Signing Visit Nursing Pain Medication Assessment:  Safety precautions to be maintained throughout the outpatient stay will include: orient to surroundings, keep bed in low position, maintain call bell within reach at all times, provide assistance with transfer out of bed and ambulation.  Medication Inspection Compliance: Pill count conducted under aseptic conditions, in front of the patient. Neither the pills nor the bottle was removed from the patient's  sight at any time. Once count was completed pills were immediately  returned to the patient in their original bottle.  Medication: Hydrocodone /APAP Pill/Patch Count: 86 of 90 pills/patches remain Pill/Patch Appearance: Markings consistent with prescribed medication Bottle Appearance: Standard pharmacy container. Clearly labeled. Filled Date: 08 / 20 / 2025 Last Medication intake:  Today  UDS:  Summary  Date Value Ref Range Status  07/18/2023 FINAL  Final    Comment:    ==================================================================== [employer] [facility] (MW) ==================================================================== Test                             Result       Flag       Units  Drug Present and Declared for Prescription Verification   Lorazepam                       1849         EXPECTED   ng/mg creat    Source of lorazepam  is a scheduled prescription medication.    Hydrocodone                     1725         EXPECTED   ng/mg creat   Hydromorphone                   113          EXPECTED   ng/mg creat   Dihydrocodeine                 156          EXPECTED   ng/mg creat   Norhydrocodone                 1804         EXPECTED   ng/mg creat    Sources of hydrocodone  include scheduled prescription medications.    Hydromorphone , dihydrocodeine and norhydrocodone are expected    metabolites of hydrocodone . Hydromorphone  and dihydrocodeine are    also available as scheduled prescription medications.  ==================================================================== Test                      Result    Flag   Units      Ref Range   Creatinine              102              mg/dL      >=79 ==================================================================== Declared Medications:  The flagging and interpretation on this report are based on the  following declared medications.  Unexpected results may arise from  inaccuracies in the declared medications.   **Note: The testing scope of this panel includes these medications:   Hydrocodone   (Norco)  Lorazepam  (Ativan )   **Note: The testing scope of this panel does not include the  following reported medications:   Acetaminophen  (Norco)  Albuterol  (Ventolin  HFA)  Apixaban  (Eliquis )  Estradiol  (Estrace )  Ezetimibe  (Zetia )  Flavoxate (Urispas)  Fluticasone  (Flonase )  Hydrocortisone   Insulin  (Lantus )  Levothyroxine  (Synthroid )  Lisinopril  (Zestril )  Magnesium   Mupirocin  (Bactroban )  Naloxone  (Narcan )  Nitroglycerin  (Nitrostat )  Ondansetron  (Zofran )  Pantoprazole  (Protonix )  Sucralfate  (Carafate )  Torsemide  (Demadex ) ==================================================================== For clinical consultation, please call [phone]. ====================================================================     No results found for: CBDTHCR No results found for: D8THCCBX No results found for: D9THCCBX  ROS  Constitutional: Denies any fever or chills  Gastrointestinal: No reported hemesis, hematochezia, vomiting, or acute GI distress Musculoskeletal: Denies any acute onset joint swelling, redness, loss of ROM, or weakness Neurological: No reported episodes of acute onset apraxia, aphasia, dysarthria, agnosia, amnesia, paralysis, loss of coordination, or loss of consciousness  Medication Review  Cholecalciferol, HYDROcodone -acetaminophen , Insulin  Pen Needle, LORazepam , Magnesium , albuterol , apixaban , ezetimibe , flavoxATE, fluticasone , hydrocortisone  cream, insulin  glargine, insulin  lispro, levothyroxine , lisinopril , naloxone , nitroGLYCERIN , ondansetron , torsemide , triamcinolone  cream, and trimethoprim   History Review  Allergy: Ms. Caudell is allergic to clindamycin , clindamycin /lincomycin, duloxetine , paroxetine , sulfa antibiotics, cephalexin , clarithromycin, diphenhydramine, estrogens conjugated, estrogens conjugated, and nitrofurantoin . Drug: Ms. Macha  reports no history of drug use. Alcohol:  reports no history of alcohol use. Tobacco:  reports that she has  never smoked. She has never used smokeless tobacco. Social: Ms. Montesano  reports that she has never smoked. She has never used smokeless tobacco. She reports that she does not drink alcohol and does not use drugs. Medical:  has a past medical history of Acute myocardial infarction of inferior wall (HCC), Arthritis, Chronic anticoagulation, Chronic atrial fibrillation (HCC), Chronic combined systolic and diastolic CHF (congestive heart failure) (HCC)estimated right ventricular  systolic pressure is 56.0 mmHg.   3. Left atrial size severely dilated, Right atrial size severely dilated. Moderate to severe MR, mild TR, Chronic low back pain with bilateral sciatica, Essential hypertension, History of use of hearing aid in both ears, Hyperlipidemia, mixed, Mixed Ischemic/Nonischemic Cardiomyopathy, Moderate to severe mitral regurgitation, Nonobstructive coronary artery disease, Obesity, Spinal stenosis, lumbar, Stasis dermatitis of both legs, Thyroid  disease, Type 2 diabetes mellitus with stage 3b chronic kidney disease, with long-term current use of insulin  (HCC), Type II diabetes mellitus (HCC), Venous insufficiency, Vitamin D deficiency, and Wears dentures. Surgical: Ms. Engebretsen  has a past surgical history that includes Vesicovaginal fistula closure w/ TAH (1996); Tubal ligation (1968); Cholecystectomy (1999); Cardiac catheterization (11/2012); Cataract extraction w/PHACO (Right, 09/24/2023); and Cataract extraction w/PHACO (Left, 10/08/2023). Family: family history includes Heart attack in her mother; Heart disease in her brother, brother, and mother; Thyroid  disease in her father.  Laboratory Chemistry Profile   Renal Lab Results  Component Value Date   BUN 44 (H) 07/22/2023   CREATININE 2.03 (H) 07/22/2023   LABCREA 87 07/21/2023   BCR 23 03/15/2022   GFRAA 73 12/31/2019   GFRNONAA 23 (L) 07/22/2023    Hepatic Lab Results  Component Value Date   AST 21 07/21/2023   ALT 12 07/21/2023   ALBUMIN 3.1 (L)  07/21/2023   ALKPHOS 57 07/21/2023   LIPASE 32 07/20/2023    Electrolytes Lab Results  Component Value Date   NA 137 07/22/2023   K 5.3 (H) 07/22/2023   CL 111 07/22/2023   CALCIUM  8.6 (L) 07/22/2023   MG 2.3 07/22/2023   PHOS 3.6 07/22/2023    Bone Lab Results  Component Value Date   25OHVITD1 43 11/26/2019   25OHVITD2 9.2 11/26/2019   25OHVITD3 34 11/26/2019    Inflammation (CRP: Acute Phase) (ESR: Chronic Phase) Lab Results  Component Value Date   CRP 4 11/26/2019   ESRSEDRATE 45 (H) 11/26/2019   LATICACIDVEN 1.2 07/20/2023         Note: Above Lab results reviewed.  Recent Imaging Review  US  Venous Img Lower Bilateral (DVT) CLINICAL DATA:  Bilateral leg swelling, pain and redness for 1-2 months.  EXAM: BILATERAL LOWER EXTREMITY VENOUS DOPPLER ULTRASOUND  TECHNIQUE: Gray-scale sonography with graded compression, as well as color Doppler and duplex ultrasound were performed to evaluate the lower extremity  deep venous systems from the level of the common femoral vein and including the common femoral, femoral, profunda femoral, popliteal and calf veins including the posterior tibial, peroneal and gastrocnemius veins when visible. The superficial great saphenous vein was also interrogated. Spectral Doppler was utilized to evaluate flow at rest and with distal augmentation maneuvers in the common femoral, femoral and popliteal veins.  COMPARISON:  None Available.  FINDINGS: RIGHT LOWER EXTREMITY  Common Femoral Vein: No evidence of thrombus. Normal compressibility, respiratory phasicity and response to augmentation.  Saphenofemoral Junction: No evidence of thrombus. Normal compressibility and flow on color Doppler imaging.  Profunda Femoral Vein: No evidence of thrombus. Normal compressibility and flow on color Doppler imaging.  Femoral Vein: No evidence of thrombus. Normal compressibility, respiratory phasicity and response to augmentation.  Popliteal  Vein: No evidence of thrombus. Normal compressibility, respiratory phasicity and response to augmentation.  Calf Veins: No evidence of thrombus. Normal compressibility and flow on color Doppler imaging.  Superficial Great Saphenous Vein: No evidence of thrombus. Normal compressibility.  Venous Reflux:  None.  Other Findings:  None.  LEFT LOWER EXTREMITY  Common Femoral Vein: No evidence of thrombus. Normal compressibility, respiratory phasicity and response to augmentation.  Saphenofemoral Junction: No evidence of thrombus. Normal compressibility and flow on color Doppler imaging.  Profunda Femoral Vein: No evidence of thrombus. Normal compressibility and flow on color Doppler imaging.  Femoral Vein: The LEFT femoral vein is limited in visualization.  Popliteal Vein: No evidence of thrombus. Normal compressibility, respiratory phasicity and response to augmentation.  Calf Veins: No evidence of thrombus. Normal compressibility and flow on color Doppler imaging.  Superficial Great Saphenous Vein: No evidence of thrombus. Normal compressibility.  Venous Reflux:  None.  Other Findings:  None.  IMPRESSION: Limited visualization of the LEFT femoral vein without evidence of deep venous thrombosis in either lower extremity.  Electronically Signed   By: Suzen Dials M.D.   On: 07/20/2023 19:25 DG Tibia/Fibula Right CLINICAL DATA:  Leg swelling and cellulitis. Evaluate for soft tissue gas.  EXAM: RIGHT TIBIA AND FIBULA - 2 VIEW  COMPARISON:  None Available.  FINDINGS: There is no evidence of fracture or other focal bone lesions. There is diffuse soft tissue swelling of the lower extremity. No definite soft tissue gas identified. Peripheral vascular calcifications are present.  IMPRESSION: Diffuse soft tissue swelling of the lower extremity. No definite soft tissue gas identified.  Electronically Signed   By: Greig Pique M.D.   On: 07/20/2023  18:46 Note: Reviewed        Physical Exam  Vitals: BP 109/80   Pulse 93   Temp 97.6 F (36.4 C)   Resp 18   Ht 5' 3 (1.6 m)   Wt 180 lb (81.6 kg)   SpO2 98%   BMI 31.89 kg/m  BMI: Estimated body mass index is 31.89 kg/m as calculated from the following:   Height as of this encounter: 5' 3 (1.6 m).   Weight as of this encounter: 180 lb (81.6 kg). Ideal: Ideal body weight: 52.4 kg (115 lb 8.3 oz) Adjusted ideal body weight: 64.1 kg (141 lb 5 oz) General appearance: Well nourished, well developed, and well hydrated. In no apparent acute distress Mental status: Alert, oriented x 3 (person, place, & time)       Respiratory: No evidence of acute respiratory distress Eyes: PERLA   Assessment   Diagnosis Status  1. Chronic lower extremity pain (1ry area of Pain) (Bilateral)   2. Chronic hip  pain (2ry area of Pain) (Bilateral) (R>L)   3. Chronic low back pain (3ry area of Pain) (Bilateral) w/ sciatica (Bilateral)   4. Lumbosacral facet syndrome   5. Chronic upper back pain   6. Chronic knee pain (Bilateral)   7. Chronic pain syndrome   8. Pharmacologic therapy   9. Chronic use of opiate for therapeutic purpose   10. Encounter for medication management   11. Encounter for chronic pain management    Controlled Controlled Controlled   Updated Problems: No problems updated.  Plan of Care  Problem-specific:  Assessment and Plan We will continue on current medication regimen.  Prescribing drug monitoring (PDMP) reviewed; findings consistent with the use of prescribed medication and no evidence of narcotic misuse or abuse. Urine drug screening (UDS) up-to-date. Schedule follow-up in 90 days for medication management with Emmy Blanch, NP.   I advised her to monitor the numbness in the left leg, and if the symptoms persist, she should follow-up with her PCP or contact the front desk to schedule an evaluation with Dr. Tanya  Ms. RUSTI ARIZMENDI has a current medication list  which includes the following long-term medication(s): albuterol , apixaban , ezetimibe , fluticasone , insulin  lispro, levothyroxine , lisinopril , nitroglycerin , torsemide , [START ON 10/19/2023] hydrocodone -acetaminophen , [START ON 11/18/2023] hydrocodone -acetaminophen , and [START ON 12/18/2023] hydrocodone -acetaminophen .  Pharmacotherapy (Medications Ordered): Meds ordered this encounter  Medications   HYDROcodone -acetaminophen  (NORCO) 10-325 MG tablet    Sig: Take 1 tablet by mouth every 8 (eight) hours as needed for severe pain (pain score 7-10). Must last 30 days.    Dispense:  90 tablet    Refill:  0    DO NOT: delete (not duplicate); no partial-fill (will deny script to complete), no refill request (F/U required). DISPENSE: 1 day early if closed on fill date. WARN: No CNS-depressants within 8 hrs of med.   HYDROcodone -acetaminophen  (NORCO) 10-325 MG tablet    Sig: Take 1 tablet by mouth every 8 (eight) hours as needed for severe pain (pain score 7-10). Must last 30 days.    Dispense:  90 tablet    Refill:  0    DO NOT: delete (not duplicate); no partial-fill (will deny script to complete), no refill request (F/U required). DISPENSE: 1 day early if closed on fill date. WARN: No CNS-depressants within 8 hrs of med.   HYDROcodone -acetaminophen  (NORCO) 10-325 MG tablet    Sig: Take 1 tablet by mouth every 8 (eight) hours as needed for severe pain (pain score 7-10). Must last 30 days.    Dispense:  90 tablet    Refill:  0    DO NOT: delete (not duplicate); no partial-fill (will deny script to complete), no refill request (F/U required). DISPENSE: 1 day early if closed on fill date. WARN: No CNS-depressants within 8 hrs of med.   Orders:  No orders of the defined types were placed in this encounter.       Return in about 3 months (around 01/10/2024) for (F2F), (MM), Emmy Blanch NP.    Recent Visits Date Type Provider Dept  07/18/23 Office Visit Kelen Laura K, NP [facility]   Showing recent visits within past 90 days and meeting all other requirements Today's Visits Date Type Provider Dept  10/11/23 Office Visit Phoebie Shad K, NP [facility]  Showing today's visits and meeting all other requirements Future Appointments Date Type Provider Dept  01/03/24 Appointment Micheline Markes K, NP [facility]  Showing future appointments within next 90 days and meeting all  other requirements  I discussed the assessment and treatment plan with the patient. The patient was provided an opportunity to ask questions and all were answered. The patient agreed with the plan and demonstrated an understanding of the instructions.  Patient advised to call back or seek an in-person evaluation if the symptoms or condition worsens.  Duration of encounter: 30 minutes.  Total time on encounter, as per AMA guidelines included both the face-to-face and non-face-to-face time personally spent by the physician and/or other qualified health care professional(s) on the day of the encounter (includes time in activities that require the physician or other qualified health care professional and does not include time in activities normally performed by clinical staff). Physician's time may include the following activities when performed: Preparing to see the patient (e.g., pre-charting review of records, searching for previously ordered imaging, lab work, and nerve conduction tests) Review of prior analgesic pharmacotherapies. Reviewing PMP Interpreting ordered tests (e.g., lab work, imaging, nerve conduction tests) Performing post-procedure evaluations, including interpretation of diagnostic procedures Obtaining and/or reviewing separately obtained history Performing a medically appropriate examination and/or evaluation Counseling and educating the patient/family/caregiver Ordering medications, tests, or procedures Referring and communicating with other health care professionals  (when not separately reported) Documenting clinical information in the electronic or other health record Independently interpreting results (not separately reported) and communicating results to the patient/ family/caregiver Care coordination (not separately reported)  Note by: Fern Asmar K Garrell Flagg, NP (TTS and AI technology used. I apologize for any typographical errors that were not detected and corrected.) Date: 10/11/2023; Time: 2:17 PM

## 2023-10-12 ENCOUNTER — Other Ambulatory Visit: Payer: Self-pay | Admitting: Cardiovascular Disease

## 2023-10-18 ENCOUNTER — Ambulatory Visit: Admitting: Dermatology

## 2023-10-18 ENCOUNTER — Encounter: Payer: Self-pay | Admitting: Dermatology

## 2023-10-18 DIAGNOSIS — D692 Other nonthrombocytopenic purpura: Secondary | ICD-10-CM | POA: Diagnosis not present

## 2023-10-18 DIAGNOSIS — I872 Venous insufficiency (chronic) (peripheral): Secondary | ICD-10-CM | POA: Diagnosis not present

## 2023-10-18 NOTE — Patient Instructions (Addendum)
 Start Sarna Sensitive skin relief lotion to arms for itching alternating with Lidocaine  cream   Due to recent changes in healthcare laws, you may see results of your pathology and/or laboratory studies on MyChart before the doctors have had a chance to review them. We understand that in some cases there may be results that are confusing or concerning to you. Please understand that not all results are received at the same time and often the doctors may need to interpret multiple results in order to provide you with the best plan of care or course of treatment. Therefore, we ask that you please give us  2 business days to thoroughly review all your results before contacting the office for clarification. Should we see a critical lab result, you will be contacted sooner.   If You Need Anything After Your Visit  If you have any questions or concerns for your doctor, please call our main line at [phone] and press option 4 to reach your doctor's medical assistant. If no one answers, please leave a voicemail as directed and we will return your call as soon as possible. Messages left after 4 pm will be answered the following business day.   You may also send us  a message via MyChart. We typically respond to MyChart messages within 1-2 business days.  For prescription refills, please ask your pharmacy to contact our office. Our fax number is [phone].  If you have an urgent issue when the clinic is closed that cannot wait until the next business day, you can page your doctor at the number below.    Please note that while we do our best to be available for urgent issues outside of office hours, we are not available 24/7.   If you have an urgent issue and are unable to reach us , you may choose to seek medical care at your doctor's office, retail clinic, urgent care center, or emergency room.  If you have a medical emergency, please immediately call 911 or go to the emergency  department.  Pager Numbers  - Dr. Hester: [phone]  - Dr. Jackquline: [phone]  - Dr. Claudene: [phone]   - Dr. Raymund: [phone]  In the event of inclement weather, please call our main line at [phone] for an update on the status of any delays or closures.  Dermatology Medication Tips: Please keep the boxes that topical medications come in in order to help keep track of the instructions about where and how to use these. Pharmacies typically print the medication instructions only on the boxes and not directly on the medication tubes.   If your medication is too expensive, please contact our office at [phone] option 4 or send us  a message through MyChart.   We are unable to tell what your co-pay for medications will be in advance as this is different depending on your insurance coverage. However, we may be able to find a substitute medication at lower cost or fill out paperwork to get insurance to cover a needed medication.   If a prior authorization is required to get your medication covered by your insurance company, please allow us  1-2 business days to complete this process.  Drug prices often vary depending on where the prescription is filled and some pharmacies may offer cheaper prices.  The website www.goodrx.com contains coupons for medications through different pharmacies. The prices here do not account for what the cost may be with help from insurance (it may be cheaper with your insurance), but the website  can give you the price if you did not use any insurance.  - You can print the associated coupon and take it with your prescription to the pharmacy.  - You may also stop by our office during regular business hours and pick up a GoodRx coupon card.  - If you need your prescription sent electronically to a different pharmacy, notify our office through [facility] or by phone at [phone] option 4.     Si Usted Necesita Algo Despus de Su  Visita  Tambin puede enviarnos un mensaje a travs de Clinical cytogeneticist. Por lo general respondemos a los mensajes de MyChart en el transcurso de 1 a 2 das hbiles.  Para renovar recetas, por favor pida a su farmacia que se ponga en contacto con nuestra oficina. Randi lakes de fax es [place] [place] [phone].  Si tiene un asunto urgente cuando la clnica est cerrada y que no puede esperar hasta el siguiente da hbil, puede llamar/localizar a su doctor(a) al nmero que aparece a continuacin.   Por favor, tenga en cuenta que aunque hacemos todo lo posible para estar disponibles para asuntos urgentes fuera del horario de [place], no estamos disponibles las 24 horas del da, los 7 [address]  [address] de la [place].   Si tiene un problema urgente y no puede comunicarse con nosotros, puede optar por buscar atencin mdica  en el consultorio de su doctor(a), en una clnica privada, en un centro de atencin urgente o en una sala de emergencias.  Si tiene Engineer, drilling, por favor llame inmediatamente al 911 o vaya a la sala de emergencias.  Nmeros de bper  - Dr. Hester: [phone]  - Dra. Jackquline: [phone]  - Dr. Claudene: [phone]  - Dra. Kitts: [phone]  En caso de inclemencias del [place], por favor llame a nuestra lnea principal al [phone] para una actualizacin sobre el estado de cualquier retraso o cierre.  Consejos para la medicacin en dermatologa: Por favor, guarde las cajas en las que vienen los medicamentos de uso tpico para ayudarle a seguir las instrucciones sobre dnde y cmo usarlos. Las farmacias generalmente imprimen las instrucciones del medicamento slo en las cajas y no directamente en los tubos del [place].   Si su medicamento es muy caro, por favor, pngase en contacto con landry rieger llamando al [phone] y presione la opcin 4 o envenos un mensaje a travs de Clinical cytogeneticist.   No podemos decirle cul ser su copago por los medicamentos por adelantado ya que esto  es diferente dependiendo de la cobertura de su seguro. Sin embargo, es posible que podamos encontrar un medicamento sustituto a Audiological scientist un formulario para que el seguro cubra el medicamento que se considera necesario.   Si se requiere una autorizacin previa para que su compaa de seguros malta su medicamento, por favor permtanos de 1 a 2 das hbiles para completar este proceso.  Los precios de los medicamentos varan con frecuencia dependiendo del Environmental consultant de dnde se surte la receta y alguna farmacias pueden ofrecer precios ms baratos.  El sitio web www.goodrx.com tiene cupones para medicamentos de Health and safety inspector. Los precios aqu no tienen en cuenta lo que podra costar con la ayuda del seguro (puede ser ms barato con su seguro), pero el sitio web puede darle el precio si no utiliz Tourist information centre manager.  - Puede imprimir el cupn correspondiente y llevarlo con su receta a la farmacia.  - Tambin puede pasar por nuestra oficina durante el horario de atencin regular y Librarian, academic tarjeta  de cupones de GoodRx.  - Si necesita que su receta se enve electrnicamente a una farmacia diferente, informe a nuestra oficina a travs de MyChart de [place] o por telfono llamando al [phone] y presione la opcin 4.

## 2023-10-18 NOTE — Progress Notes (Signed)
   Follow-Up Visit   Subjective  Tracy Mann is a 89 y.o. female who presents for the following: Atopic dermvs id reaction from venous stasis, lower legs, TMC 0.1% cr qd/bid improved, rash arms 1wk, itchy, TMC 0.1% cr tid no improvement and itching worsened with cr   The following portions of the chart were reviewed this encounter and updated as appropriate: medications, allergies, medical history  Review of Systems:  No other skin or systemic complaints except as noted in HPI or Assessment and Plan.  Objective  Well appearing patient in no apparent distress; mood and affect are within normal limits.   A focused examination was performed of the following areas: Arms, legs   Relevant exam findings are noted in the Assessment and Plan.    Assessment & Plan   Venous stasis dermatitis with ID Reaction Lower legs Exam: residual erythema and scale on legs, rash resolved elsewhere, significantly improved  Chronic and persistent condition with duration or expected duration over one year. Condition is improving with treatment but not currently at goal.   Treatment Plan: D/C TMC 0.1% cream for now, may restart prn legs, avoid face, groin, axilla Continue compression daily  Purpura - Chronic; persistent and recurrent.  Treatable, but not curable. Arms  - Violaceous macules and patches on L > R forearm dorsal hands - Benign - Related to trauma, age, sun damage and/or use of blood thinners, chronic use of topical and/or oral steroids - Observe - Can use OTC arnica containing moisturizer such as Dermend Bruise Formula if desired - Call for worsening or other concerns -Recommend anti itch cream prn itching, Sarna Sensitive skin alternating with Lidocaine  cream as needed for itching D/C TMC 0.1% cr to arms    Return if symptoms worsen or fail to improve.  I, Grayce Saunas, RMA, am acting as scribe for Boneta Sharps, MD .   Documentation: I have reviewed the above  documentation for accuracy and completeness, and I agree with the above.  Boneta Sharps, MD

## 2023-11-06 ENCOUNTER — Encounter: Payer: Self-pay | Admitting: Cardiovascular Disease

## 2023-11-06 NOTE — Telephone Encounter (Signed)
 Error

## 2023-12-14 IMAGING — CR DG CHEST 2V
1 series · 2 of 2 positions shown · non-contrast
Comparison: Radiograph 01/09/2020

CLINICAL DATA: Shortness of breath. Chronic combined systolic and
diastolic congestive heart failure. Coronary artery disease of
native artery of native heart with stable angina pectoralis. Chronic
atrial fibrillation. Dyspnea on exertion.

EXAM:
CHEST - 2 VIEW

[Series 1: dg chest 2 view · 0.14mm/px · 2 of 2 slices shown]
[im 1/2]
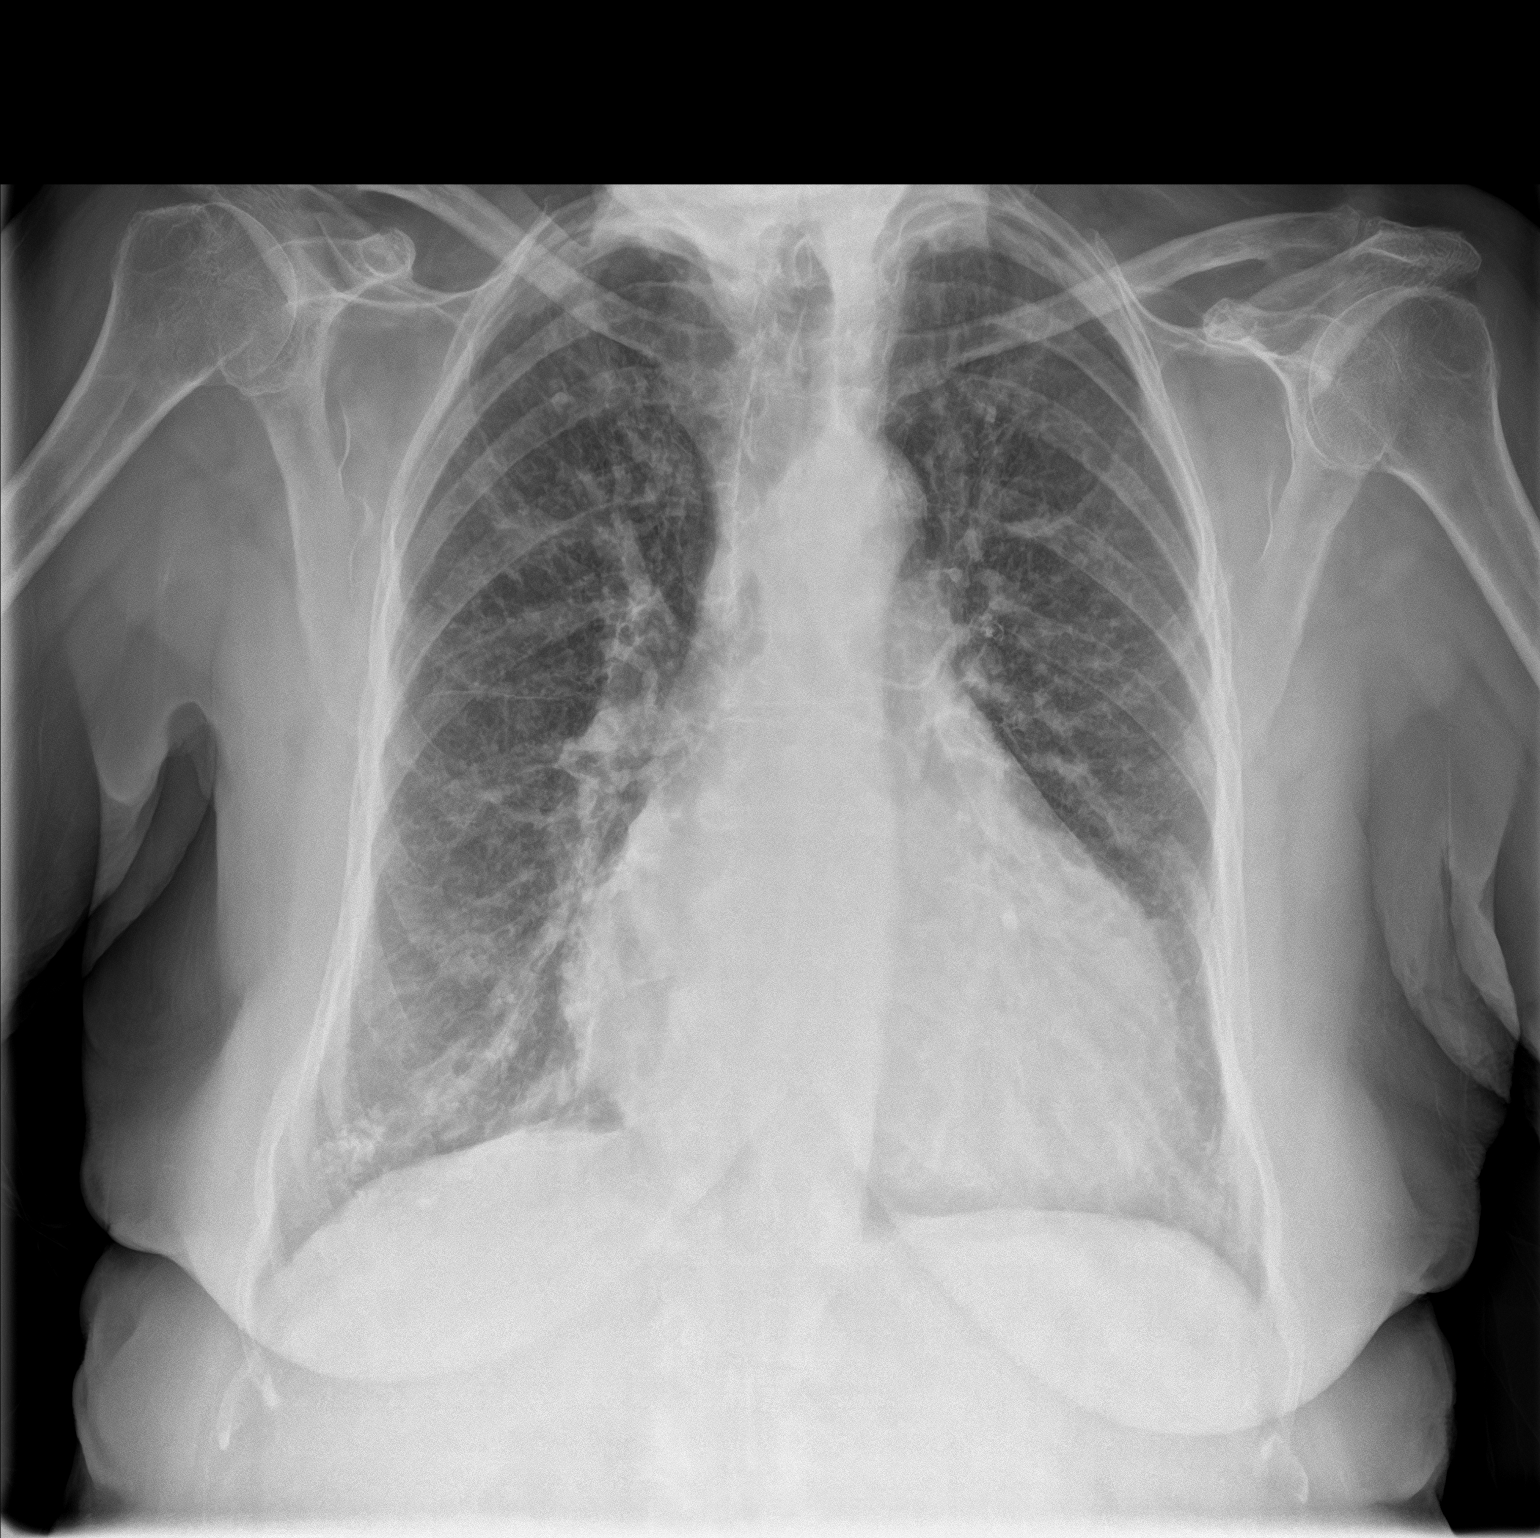
[im 2/2]
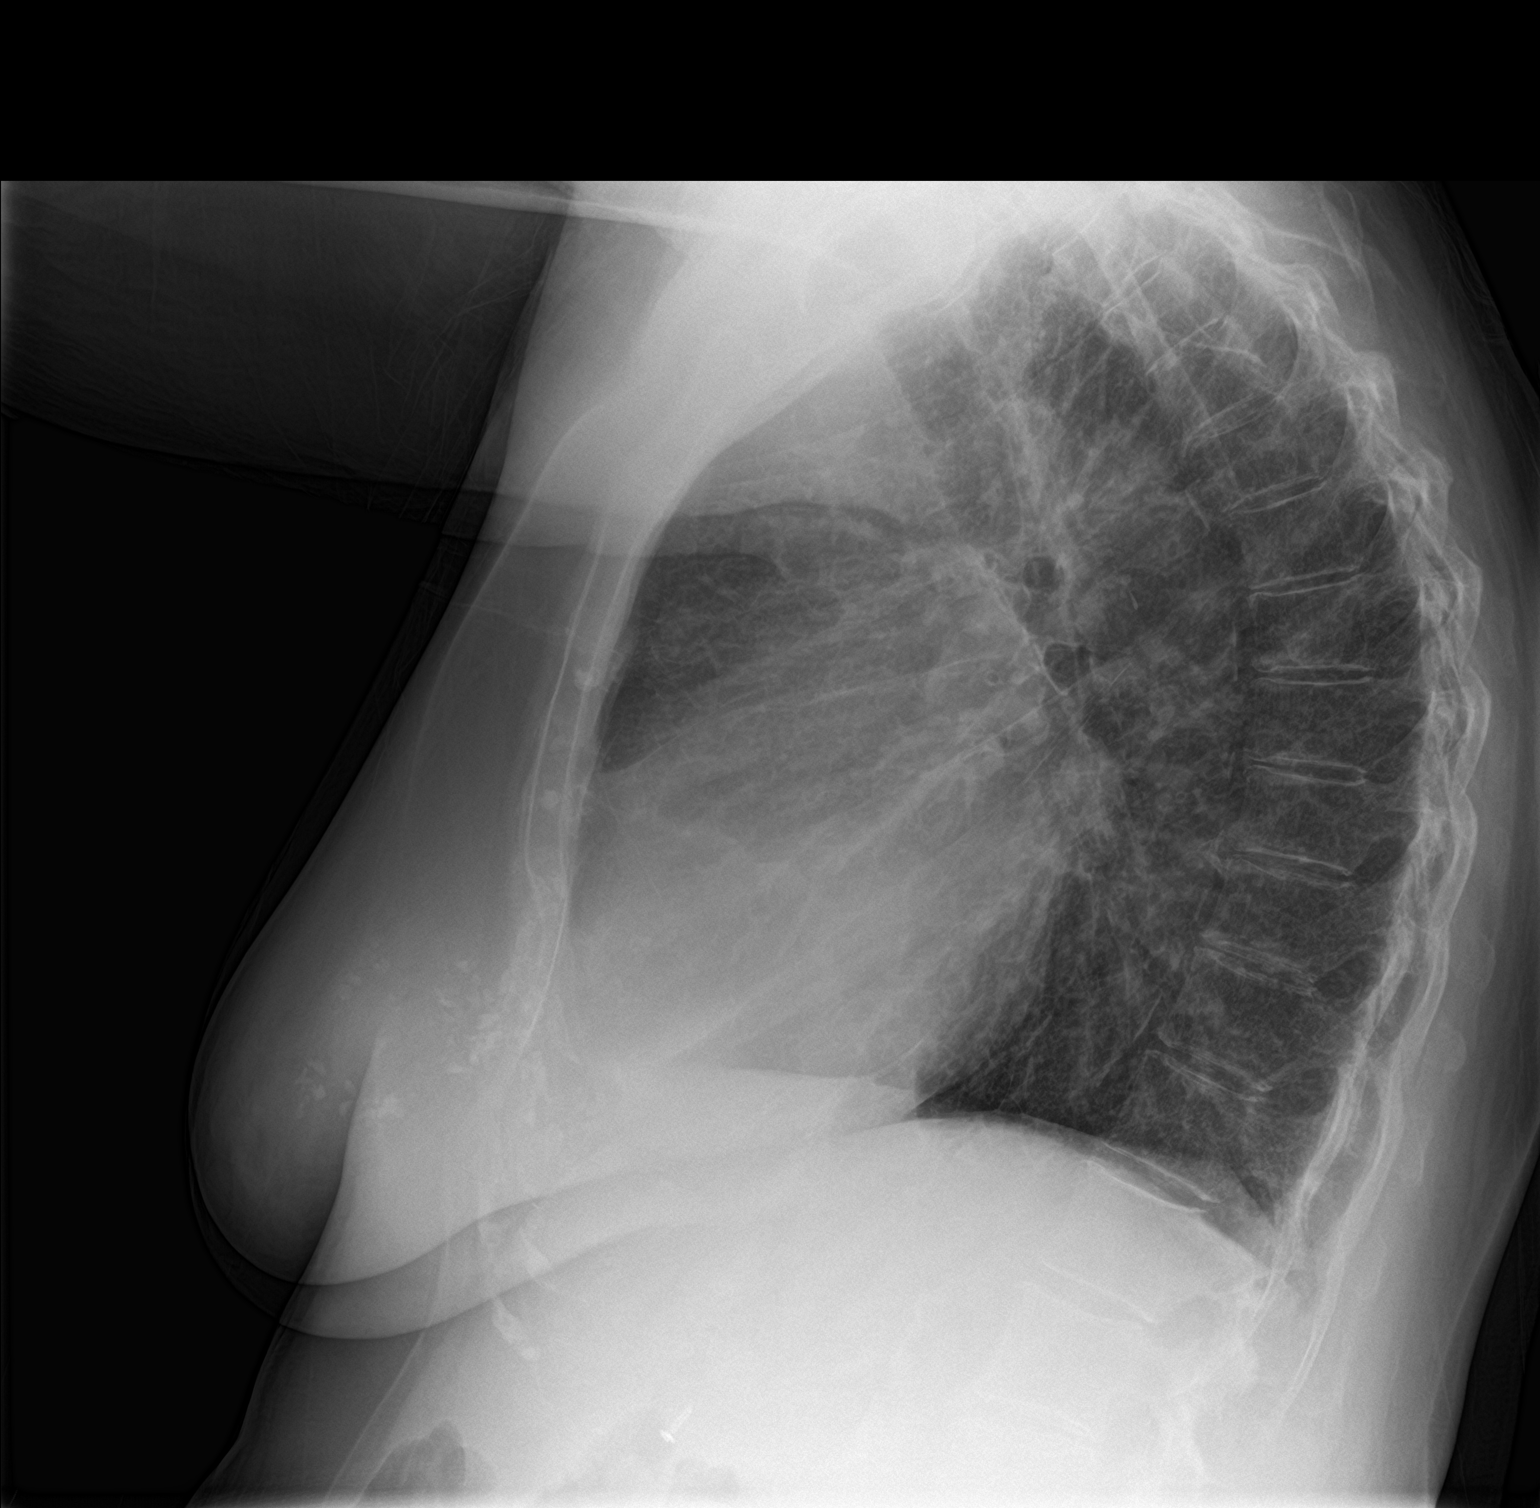

[2 of 2 positions shown; findings below may reference images not displayed]

FINDINGS: Cardiomegaly which is slightly improved compared with prior exam.
Stable mediastinal contours. Aortic atherosclerosis. Breast
calcifications project over the lower lung zones. Stable
interstitial coarsening. No superimposed pulmonary edema. No focal
airspace disease. No pleural effusion or pneumothorax. No acute
osseous findings.
IMPRESSION: 1. Cardiomegaly, slightly improved from prior.
2. No acute pulmonary process or pulmonary edema.

## 2023-12-21 ENCOUNTER — Telehealth: Payer: Self-pay | Admitting: Cardiovascular Disease

## 2023-12-21 NOTE — Telephone Encounter (Signed)
 Called and spoke with the patient. She reports leg swelling and intermittent shortness of breath for the past 5-6 weeks. She also experienced left-sided chest pain last week and again yesterday; each episode lasted approximately 30 minutes.  The patient states that her PCP discontinued torsemide  on 11/29/2023 due to a kidney infection. She noticed swelling about four days later and resumed taking torsemide  20 mg daily on her own, recently increasing the dose to 40 mg daily, but reports persistent swelling.  The patient is scheduled for a clinic visit on 01/01/2024. She was advised to seek emergency care if symptoms worsen or new symptoms develop. Patient verbalized understanding.

## 2023-12-21 NOTE — Telephone Encounter (Signed)
 Pt c/o swelling/edema: STAT if pt has developed SOB within 24 hours  If swelling, where is the swelling located? Legs and ankles   How much weight have you gained and in what time span?  Per her PCP she has gained 10lbs since her last visit there.   Have you gained 2 pounds in a day or 5 pounds in a week? Not sure if it was in a day or week   Do you have a log of your daily weights (if so, list)?   10/30: 185lbs  11/18: 195lbs   Are you currently taking a fluid pill? Yes states she is still taking torsemide    Are you currently SOB? Not currently but has noticed slight sob since swelling   Have you traveled recently in a car or plane for an extended period of time? No   She also expressed concern as to why her PCP ordered a chest xray for her this week and asked if Dr. [name] can review.     Pt c/o of Chest Pain: STAT if active (IN THIS MOMENT) CP, including tightness, pressure, jaw pain, shoulder/upper arm/back pain, SOB, nausea, and vomiting.  1. Are you having CP right now (tightness, pressure, or discomfort)? No   2. Are you experiencing any other symptoms (ex. SOB, nausea, vomiting, sweating)? Swelling, sob sometimes (not currently)   3. How long have you been experiencing CP? Happened 2-3 times about a week ago. She states first episode lasted for 30 minutes   4. Is your CP continuous or coming and going? Coming and going   5. Have you taken Nitroglycerin ? No    6. If CP returns before callback, please consider calling 911. ?

## 2023-12-31 NOTE — Progress Notes (Unsigned)
 Cardiology Office Note    Date:  01/01/2024   ID:  Khyra, Viscuso [birth year], MRN [id number]  PCP:  Bertrum Charlie CROME, MD  Cardiologist:  Evalene Lunger, MD  Electrophysiologist:  None   Chief Complaint: Lower extremity edema and DOE  History of Present Illness:   Tracy Mann is a 89 y.o. female with history of chronic atrial fibrillation on Coumadin , systolic and diastolic heart failure (EF 30 to 35%), mixed ischemic and nonischemic cardiomyopathy, morbid obesity, type 2 diabetes, OSA not on CPAP, and prior embolic MI (2007) who presents for evaluation of lower extremity edema and DOE.    Per chart review patient reportedly had MI in 2007 which was felt to be due to embolic event from A-fib.  Cardiac catheterization in 2014 was with minimal CAD.  EF 45 to 50% with akinesis/aneurysm of the distal/apical inferior wall.  Echo 01/2018 with EF 40 to 45%.  Echo 06/2020 with EF 30 to 35% and moderate MR.  Lexiscan  Myoview  06/2020 with EF 39% and small inferoapical scar.  Echo 04/2021 with EF 30 to 35%, low normal RV, moderate to severe MR, and moderate TR, and RVSP 56 mmHg.  Patient has been followed closely by Dr. [name] for heart failure.  Management has been limited by high fluid intake and intolerance to almost all GDMT including carvedilol , Farxiga , Entresto , and spironolactone.  She was unable to tolerate metoprolol  due to orthostasis.   Patient was most recently seen by Dr. [name] 06/18/2023 overall doing well from a cardiac perspective.  Noted to have chronic bilateral lower extremity edema for which she wore compression hose.  She was taking torsemide  daily without need for extra metolazone .  She remained in atrial fibrillation with rate well-controlled.  BP was low and limited further titration of GDMT.  Patient was seen by nephrology 11/20/2023 with UTI symptoms. She saw her PCP 11/29/2023 with ongoing symptoms. Noted to have UTI and AKI and was recommended to hold torsemide . Saw  nephrology again 12/04/2023 who recommended resuming torsemide  at 20 mg daily and reducing lisinopril  dose. Saw her PCP 12/18/2023 with lower extremity swelling and shortness of breath. She was up 8 lbs since her prior visit at 195 lbs. She was instructed to continue torsemide  40 mg daily and increase to twice daily if weight increased by > 2 lbs in a couple of days. CXR ordered and showed mild pulmonary edema.   Patient presents today with ongoing concern for worsening lower extremity swelling and dyspnea with minimal exertion. She does not feel that taking an extra dose of torsemide  in the afternoons makes her urinate more. She has the urgency to go but does not urinate more. She has dyspnea with little exertion which causes her to be more sedentary throughout the day. She chronically sleeps with her head elevated but feels as though recently she has been more short of breath when laying down to sleep. She also reports fatigue which has been ongoing for several months. She has faint sporadic episodes of left sided chest discomfort which she describes as a tightness. These last for a few seconds and resolve spontaneously. She also has intermittent episodes of palpitations which last for a few seconds and she attributes to her atrial fibrillation. She denies exertional chest pain, lightheadedness, dizziness, and PND.  Labs independently reviewed: 12/2023-BUN 36, creatinine 1.45, sodium 143, potassium 5.4, Hgb 9.6, HCT 30.3, platelets 227 10/2022-TC 163, TG 103, HDL 51, LDL 91  Objective   Past Medical History:  Diagnosis Date   Acute myocardial infarction of inferior wall (HCC)    a. 10/2005 - Presumed to be 2/2 to a coronary embolus in setting of persistent Afib-->Cath 2014 relatively nl cors, EF 45-50% w/ severe distal inferior/apical inferior HK w/ aneursymal appearance.   Arthritis    Chronic anticoagulation    a. warfarin.   Chronic atrial fibrillation (HCC)    a. CHA2DS2VASc = 6--> warfarin.    Chronic combined systolic and diastolic CHF (congestive heart failure) (HCC)estimated right ventricular  systolic pressure is 56.0 mmHg.   3. Left atrial size severely dilated, Right atrial size severely dilated. Moderate to severe MR, mild TR    1. EF 30 to 35%. LV moderate to severely decreased function. LV demonstrates global hypokinesis. LV internal  cavity size mildly dilated. LV diastolic parameters are indeterminate.   2. RV systolic function low normal. RV moderately enlarged. moderately elevated  PASP.   Chronic low back pain with bilateral sciatica    Essential hypertension    History of use of hearing aid in both ears    Hyperlipidemia, mixed    Mixed Ischemic/Nonischemic Cardiomyopathy    a. 10/2012 Echo: EF 35-40%;  b. 11/2012 EF 45-50% by LV gram.   Moderate to severe mitral regurgitation    Nonobstructive coronary artery disease    a. 2007 inferior wall MI thought to be secondary to thrombus from atrial fib b. 2014 cath with R dominant coronary arterial system with mild luminal irregularities c. Myoview  05/2015 old inferior MI, no new ischemic changes   Obesity    Spinal stenosis, lumbar    Stasis dermatitis of both legs    Thyroid  disease    hypothyroidism   Type 2 diabetes mellitus with stage 3b chronic kidney disease, with long-term current use of insulin  (HCC)    Type II diabetes mellitus (HCC)    a. 07/2018 A1c 8.7 - long term insulin  use   Venous insufficiency    Vitamin D deficiency    Wears dentures     Current Medications: Current Meds  Medication Sig   albuterol  (VENTOLIN  HFA) 108 (90 Base) MCG/ACT inhaler TAKE 2 PUFFS BY MOUTH EVERY 6 HOURS AS NEEDED FOR WHEEZE OR SHORTNESS OF BREATH   apixaban  (ELIQUIS ) 2.5 MG TABS tablet Take 1 tablet (2.5 mg total) by mouth 2 (two) times daily.   CHOLECALCIFEROL PO Take 1 tablet by mouth daily. Dosage unknown   ezetimibe  (ZETIA ) 10 MG tablet Take 1 tablet (10 mg total) by mouth daily.   flavoxATE (URISPAS) 100 MG tablet Take  1 tablet (100 mg total) by mouth 2 (two) times daily as needed for bladder spasms.   fluticasone  (FLONASE ) 50 MCG/ACT nasal spray Place 2 sprays into both nostrils daily.   HYDROcodone -acetaminophen  (NORCO) 10-325 MG tablet Take 1 tablet by mouth every 8 (eight) hours as needed for severe pain (pain score 7-10). Must last 30 days.   HYDROcodone -acetaminophen  (NORCO) 10-325 MG tablet Take 1 tablet by mouth every 8 (eight) hours as needed for severe pain (pain score 7-10). Must last 30 days.   HYDROcodone -acetaminophen  (NORCO) 10-325 MG tablet Take 1 tablet by mouth every 8 (eight) hours as needed for severe pain (pain score 7-10). Must last 30 days.   hydrocortisone  cream 0.5 % Apply 1 Application topically 2 (two) times daily.   insulin  glargine (LANTUS  SOLOSTAR) 100 UNIT/ML Solostar Pen Inject 18-24 Units into the skin at bedtime.   insulin  lispro (HUMALOG ) 100 UNIT/ML KwikPen Inject 10-25 Units into the skin in  the morning, at noon, and at bedtime.   Insulin  Pen Needle (RELION PEN NEEDLE 31G/8MM) 31G X 8 MM MISC Use with insulin  pen three times daily   levothyroxine  (SYNTHROID ) 25 MCG tablet Take 25 mcg by mouth daily before breakfast.   lisinopril  (ZESTRIL ) 10 MG tablet Take 1 tablet by mouth once daily   LORazepam  (ATIVAN ) 0.5 MG tablet Take 0.5 mg by mouth every 8 (eight) hours as needed for anxiety.   Magnesium  500 MG CAPS Take 1,000 mg by mouth daily.    nitroGLYCERIN  (NITROSTAT ) 0.4 MG SL tablet Place 1 tablet (0.4 mg total) under the tongue every 5 (five) minutes as needed for chest pain.   ondansetron  (ZOFRAN ) 4 MG tablet Take 1 tablet (4 mg total) by mouth every 12 (twelve) hours as needed for nausea or vomiting.   torsemide  (DEMADEX ) 20 MG tablet Take 2 tablets (40 mg total) by mouth daily. May take an additional 40 MG daily as needed at lunch for swelling.   triamcinolone  cream (KENALOG ) 0.1 % Apply twice daily to aa rash/itch or mosquito bite until smooth. Avoid applying to face, groin,  and axilla. Use as directed. Long-term use can cause thinning of the skin.   trimethoprim  (TRIMPEX ) 100 MG tablet Take 100 mg by mouth daily.    Allergies:   Clindamycin , Clindamycin /lincomycin, Duloxetine , Paroxetine , Sulfa antibiotics, Cephalexin , Clarithromycin, Diphenhydramine, Estrogens conjugated, Estrogens conjugated, and Nitrofurantoin    Social History   Socioeconomic History   Marital status: Widowed    Spouse name: widowed   Number of children: 4   Years of education: 14   Highest education level: 12th grade  Occupational History   Occupation: retired  Tobacco Use   Smoking status: Never   Smokeless tobacco: Never  Vaping Use   Vaping status: Never Used  Substance and Sexual Activity   Alcohol use: No   Drug use: No   Sexual activity: Not Currently  Other Topics Concern   Not on file  Social History Narrative   Widowed   Has children and grandchildren   Lives in [place]   Social Drivers of Health   Financial Resource Strain: Low Risk  (07/19/2023)   Received from [facility]   Overall Financial Resource Strain (CARDIA)    Difficulty of Paying Living Expenses: Not hard at all  Food Insecurity: No Food Insecurity (07/20/2023)   Hunger Vital Sign    Worried About Running Out of Food in the Last Year: Never true    Ran Out of Food in the Last Year: Never true  Transportation Needs: No Transportation Needs (07/20/2023)   PRAPARE - Administrator, Civil Service (Medical): No    Lack of Transportation (Non-Medical): No  Physical Activity: Not on file  Stress: No Stress Concern Present (03/14/2017)   [employer] of Occupational Health - Occupational Stress Questionnaire    Feeling of Stress : Not at all  Social Connections: Moderately Isolated (07/20/2023)   Social Connection and Isolation Panel    Frequency of Communication with Friends and Family: Once a week    Frequency of Social Gatherings with Friends and Family: Once a  week    Attends Religious Services: 1 to 4 times per year    Active Member of [employer] or Organizations: Yes    Attends Banker Meetings: 1 to 4 times per year    Marital Status: Widowed     Family History:  The patient's family history includes Heart attack in her mother;  Heart disease in her brother, brother, and mother; Thyroid  disease in her father.  ROS:   12-point review of systems is negative unless otherwise noted in the HPI.  EKGs/Other Studies Reviewed:    Studies reviewed were summarized above. The additional studies were reviewed today:  06/2021 Long term monitor Atrial Fibrillation occurred continuously (100% burden), ranging from 32-164 bpm (avg of 86 bpm). 5 Ventricular Tachycardia runs occurred, the run with the fastest interval lasting 4 beats with a max rate of 176 bpm, the longest lasting 7 beats with an avg rate of 153 bpm.  Intermittent Bundle Branch Block was present. Isolated VEs were occasional (1.6%, 28130), VE Couplets were rare (<1.0%, 193), and VE Triplets were rare (<1.0%, 7).  Ventricular Bigeminy and Trigeminy were present. Inverted QRS complexes possibly due to inverted placement of device Triggered events associated with atrial fibrillation, rare PVC  04/2021 Echo complete 1. Left ventricular ejection fraction, by estimation, is 30 to 35%. The  left ventricle has moderate to severely decreased function. The left  ventricle demonstrates global hypokinesis. The left ventricular internal  cavity size was mildly dilated. Left  ventricular diastolic parameters are indeterminate.   2. Right ventricular systolic function is low normal. The right  ventricular size is moderately enlarged. There is moderately elevated  pulmonary artery systolic pressure. The estimated right ventricular  systolic pressure is 56.0 mmHg.   3. Left atrial size was severely dilated.   4. Right atrial size was severely dilated.   5. The mitral valve is degenerative.  Moderate to severe mitral valve  regurgitation.   6. Tricuspid valve regurgitation is mild to moderate.   7. The aortic valve was not well visualized. Aortic valve regurgitation  is not visualized.   8. The inferior vena cava is dilated in size with <50% respiratory  variability, suggesting right atrial pressure of 15 mmHg.   EKG:  EKG personally reviewed by me today EKG Interpretation Date/Time:  Tuesday January 01 2024 09:34:09 EST Ventricular Rate:  84 PR Interval:    QRS Duration:  96 QT Interval:  402 QTC Calculation: 475 R Axis:   -62  Text Interpretation: Atrial fibrillation Left axis deviation Left bundle branch block Confirmed by Lorene Sinclair ([id number]) on 01/01/2024 9:38:28 AM  PHYSICAL EXAM:    VS:  BP (!) 128/58 (BP Location: Left Arm, Patient Position: Sitting, Cuff Size: Normal)   Pulse 84   Ht 5' 3.5 (1.613 m)   Wt 191 lb 9.6 oz (86.9 kg)   SpO2 93%   BMI 33.41 kg/m   BMI: Body mass index is 33.41 kg/m.  GEN: Well nourished, well developed in no acute distress NECK: No JVD; No carotid bruits CARDIAC: IRIR, no murmurs, rubs, gallops RESPIRATORY:  Clear to auscultation without rales, wheezing or rhonchi  ABDOMEN: Soft, non-tender, non-distended EXTREMITIES: 1+ LE edema, L greater than R; No deformity  Wt Readings from Last 3 Encounters:  01/01/24 191 lb 9.6 oz (86.9 kg)  10/11/23 180 lb (81.6 kg)  10/08/23 189 lb 4.8 oz (85.9 kg)                  ASSESSMENT & PLAN:   Acute on chronic combined systolic and diastolic heart failure Mixed ischemic and nonischemic cardiomyopathy - Most recent echo 04/2021 with EF 30-35%. Weight is up ~10 lbs in the past 3 months. Recent CXR with pulmonary edema. Patient reports worsening DOE and LE edema. Recommend increasing torsemide  to 60 mg daily x 5 days, then resuming 40  mg daily. She was provided a log to record her daily weights. Check BMP today. Continue lisinopril  2.5 mg daily, which was recently reduced by  nephrology. Further escalation of GDMT is limited by symptomatic hypotension and renal dysfunction.   Permanent atrial fibrillation - Remains in atrial fibrillation today which is rate controlled without AV nodal blocking agents. She is continued on Eliquis  2.5 mg twice daily (age and kidney function).   CKD IIIb - Had an AKI last month with GFR down to 11. Presumably due to trimethoprim  use for UTI. Kidney function now improving with Cr 1.45 and GFR 35 on 11/19. Update BMP today. Will continue to monitor closely with adjustment of diuretics.   Hyperlipidemia - Most recent lipid panel 10/2022 with LDL 91, above goal. Continue ezetimibe . Could consider addition of bempedoic acid at follow-up.  Hypertension - BP previously running low.  Well-controlled today.  She is continued on lisinopril  2.5 mg daily.  OSA - Not on CPAP.  T2DM - Most recent A1c 6.9 on 07/2023.  Ongoing management per PCP.    Disposition: Check BMP.  Increase torsemide  to 60 mg daily x 5 days then resume 40 mg daily thereafter.  F/u with me in 1-2 weeks. Anticipate repeat BMP at follow up.   Medication Adjustments/Labs and Tests Ordered: Current medicines are reviewed at length with the patient today.  Concerns regarding medicines are outlined above. Medication changes, Labs and Tests ordered today are summarized above and listed in the Patient Instructions accessible in Encounters.   Bonney Lesley Maffucci, PA-C 01/01/2024 10:44 AM     [facility] - [place] [address] [place], [place] [address] [phone]

## 2024-01-01 ENCOUNTER — Ambulatory Visit: Attending: Physician Assistant | Admitting: Physician Assistant

## 2024-01-01 ENCOUNTER — Encounter: Payer: Self-pay | Admitting: Physician Assistant

## 2024-01-01 VITALS — BP 128/58 | HR 84 | Ht 63.5 in | Wt 191.6 lb

## 2024-01-01 DIAGNOSIS — I482 Chronic atrial fibrillation, unspecified: Secondary | ICD-10-CM | POA: Diagnosis not present

## 2024-01-01 DIAGNOSIS — E119 Type 2 diabetes mellitus without complications: Secondary | ICD-10-CM

## 2024-01-01 DIAGNOSIS — E782 Mixed hyperlipidemia: Secondary | ICD-10-CM

## 2024-01-01 DIAGNOSIS — I5043 Acute on chronic combined systolic (congestive) and diastolic (congestive) heart failure: Secondary | ICD-10-CM

## 2024-01-01 DIAGNOSIS — N1832 Chronic kidney disease, stage 3b: Secondary | ICD-10-CM | POA: Diagnosis not present

## 2024-01-01 DIAGNOSIS — G4733 Obstructive sleep apnea (adult) (pediatric): Secondary | ICD-10-CM

## 2024-01-01 DIAGNOSIS — I10 Essential (primary) hypertension: Secondary | ICD-10-CM

## 2024-01-01 DIAGNOSIS — Z794 Long term (current) use of insulin: Secondary | ICD-10-CM

## 2024-01-01 NOTE — Patient Instructions (Signed)
 Medication Instructions:  Your physician recommends the following medication changes.  INCREASE: Torsemide  60mg  daily for 5 days  *If you need a refill on your cardiac medications before your next appointment, please call your pharmacy*  Lab Work: Your provider would like for you to have following labs drawn today BMP.   If you have labs (blood work) drawn today and your tests are completely normal, you will receive your results only by: MyChart Message (if you have MyChart) OR A paper copy in the mail If you have any lab test that is abnormal or we need to change your treatment, we will call you to review the results.  Testing/Procedures: No test ordered today   Follow-Up: At [facility], you and your health needs are our priority.  As part of our continuing mission to provide you with exceptional heart care, our providers are all part of one team.  This team includes your primary Cardiologist (physician) and Advanced Practice Providers or APPs (Physician Assistants and Nurse Practitioners) who all work together to provide you with the care you need, when you need it.  Your next appointment:   1-2  week(s)  Provider:   You may see Timothy [name], MD or one of the following Advanced Practice Providers on your designated Care Team:   Lonni Meager, NP Lesley Maffucci, PA-C Bernardino Bring, PA-C Cadence Gary, PA-C Tylene Lunch, NP Barnie Hila, NP    Other Instructions  KEEP A LOG OF WEIGHT FOR THE NEXT WEEK

## 2024-01-02 LAB — BASIC METABOLIC PANEL WITH GFR
BUN/Creatinine Ratio: 28 (ref 12–28)
BUN: 41 mg/dL — ABNORMAL HIGH (ref 8–27)
CO2: 27 mmol/L (ref 20–29)
Calcium: 9.7 mg/dL (ref 8.7–10.3)
Chloride: 100 mmol/L (ref 96–106)
Creatinine, Ser: 1.48 mg/dL — ABNORMAL HIGH (ref 0.57–1.00)
Glucose: 135 mg/dL — ABNORMAL HIGH (ref 70–99)
Potassium: 5.4 mmol/L — ABNORMAL HIGH (ref 3.5–5.2)
Sodium: 141 mmol/L (ref 134–144)
eGFR: 34 mL/min/1.73 — ABNORMAL LOW (ref >59)

## 2024-01-03 ENCOUNTER — Encounter: Payer: Self-pay | Admitting: Nurse Practitioner

## 2024-01-03 ENCOUNTER — Ambulatory Visit: Attending: Nurse Practitioner | Admitting: Nurse Practitioner

## 2024-01-03 ENCOUNTER — Ambulatory Visit: Payer: Self-pay | Admitting: Physician Assistant

## 2024-01-03 DIAGNOSIS — M79604 Pain in right leg: Secondary | ICD-10-CM | POA: Insufficient documentation

## 2024-01-03 DIAGNOSIS — M25552 Pain in left hip: Secondary | ICD-10-CM | POA: Diagnosis present

## 2024-01-03 DIAGNOSIS — M25562 Pain in left knee: Secondary | ICD-10-CM | POA: Diagnosis present

## 2024-01-03 DIAGNOSIS — M549 Dorsalgia, unspecified: Secondary | ICD-10-CM | POA: Insufficient documentation

## 2024-01-03 DIAGNOSIS — G8929 Other chronic pain: Secondary | ICD-10-CM | POA: Diagnosis present

## 2024-01-03 DIAGNOSIS — M25551 Pain in right hip: Secondary | ICD-10-CM | POA: Diagnosis present

## 2024-01-03 DIAGNOSIS — M5442 Lumbago with sciatica, left side: Secondary | ICD-10-CM | POA: Diagnosis present

## 2024-01-03 DIAGNOSIS — M47817 Spondylosis without myelopathy or radiculopathy, lumbosacral region: Secondary | ICD-10-CM | POA: Insufficient documentation

## 2024-01-03 DIAGNOSIS — Z79891 Long term (current) use of opiate analgesic: Secondary | ICD-10-CM | POA: Diagnosis present

## 2024-01-03 DIAGNOSIS — M25561 Pain in right knee: Secondary | ICD-10-CM | POA: Insufficient documentation

## 2024-01-03 DIAGNOSIS — G894 Chronic pain syndrome: Secondary | ICD-10-CM | POA: Diagnosis present

## 2024-01-03 DIAGNOSIS — Z79899 Other long term (current) drug therapy: Secondary | ICD-10-CM | POA: Insufficient documentation

## 2024-01-03 DIAGNOSIS — M79605 Pain in left leg: Secondary | ICD-10-CM | POA: Diagnosis present

## 2024-01-03 DIAGNOSIS — M5441 Lumbago with sciatica, right side: Secondary | ICD-10-CM | POA: Insufficient documentation

## 2024-01-03 MED ORDER — HYDROCODONE-ACETAMINOPHEN 10-325 MG PO TABS: 1.0000 | ORAL_TABLET | Freq: Three times a day (TID) | ORAL | 0 refills | Status: AC | PRN

## 2024-01-03 NOTE — Progress Notes (Signed)
 Nursing Pain Medication Assessment:  Safety precautions to be maintained throughout the outpatient stay will include: orient to surroundings, keep bed in low position, maintain call bell within reach at all times, provide assistance with transfer out of bed and ambulation.  Medication Inspection Compliance: Pill count conducted under aseptic conditions, in front of the patient. Neither the pills nor the bottle was removed from the patient's sight at any time. Once count was completed pills were immediately returned to the patient in their original bottle.  Medication: Hydrocodone /APAP Pill/Patch Count: 90 of 90 pills/patches remain Pill/Patch Appearance: Markings consistent with prescribed medication Bottle Appearance: Standard pharmacy container. Clearly labeled. Filled Date: 62 / 53 / 2025 Last Medication intake:  Today

## 2024-01-03 NOTE — Patient Instructions (Signed)
 ______________________________________________________________________    Opioid Pain Medication Update  To: All patients taking opioid pain medications. (I.e.: hydrocodone , hydromorphone , oxycodone , oxymorphone, morphine , codeine , methadone, tapentadol, tramadol, buprenorphine , fentanyl , etc.)  Re: Updated review of side effects and adverse reactions of opioid analgesics, as well as new information about long term effects of this class of medications.  Direct risks of long-term opioid therapy are not limited to opioid addiction and overdose. Potential medical risks include serious fractures, breathing problems during sleep, hyperalgesia, immunosuppression, chronic constipation, bowel obstruction, myocardial infarction, and tooth decay secondary to xerostomia.  Unpredictable adverse effects that can occur even if you take your medication correctly: Cognitive impairment, respiratory depression, and death. Most people think that if they take their medication correctly, and as instructed, that they will be safe. Nothing could be farther from the truth. In reality, a significant amount of recorded deaths associated with the use of opioids has occurred in individuals that had taken the medication for a long time, and were taking their medication correctly. The following are examples of how this can happen: Patient taking his/her medication for a long time, as instructed, without any side effects, is given a certain antibiotic or another unrelated medication, which in turn triggers a Drug-to-drug interaction leading to disorientation, cognitive impairment, impaired reflexes, respiratory depression or an untoward event leading to serious bodily harm or injury, including death.  Patient taking his/her medication for a long time, as instructed, without any side effects, develops an acute impairment of liver and/or kidney function. This will lead to a rapid inability of the body to breakdown and eliminate  their pain medication, which will result in effects similar to an overdose, but with the same medicine and dose that they had always taken. This again may lead to disorientation, cognitive impairment, impaired reflexes, respiratory depression or an untoward event leading to serious bodily harm or injury, including death.  A similar problem will occur with patients as they grow older and their liver and kidney function begins to decrease as part of the aging process.  Background information: Historically, the original case for using long-term opioid therapy to treat chronic noncancer pain was based on safety assumptions that subsequent experience has called into question. In 1996, the American Pain Society and the American Academy of Pain Medicine issued a consensus statement supporting long-term opioid therapy. This statement acknowledged the dangers of opioid prescribing but concluded that the risk for addiction was low; respiratory depression induced by opioids was short-lived, occurred mainly in opioid-naive patients, and was antagonized by pain; tolerance was not a common problem; and efforts to control diversion should not constrain opioid prescribing. This has now proven to be wrong. Experience regarding the risks for opioid addiction, misuse, and overdose in community practice has failed to support these assumptions.  According to the Centers for Disease Control and Prevention, fatal overdoses involving opioid analgesics have increased sharply over the past decade. Currently, more than 96,700 people die from drug overdoses every year. Opioids are a factor in 7 out of every 10 overdose deaths. Deaths from drug overdose have surpassed motor vehicle accidents as the leading cause of death for individuals between the ages of 11 and 90.  Clinical data suggest that neuroendocrine dysfunction may be very common in both men and women, potentially causing hypogonadism, erectile dysfunction, infertility,  decreased libido, osteoporosis, and depression. Recent studies linked higher opioid dose to increased opioid-related mortality. Controlled observational studies reported that long-term opioid therapy may be associated with increased risk for cardiovascular events. Subsequent  meta-analysis concluded that the safety of long-term opioid therapy in elderly patients has not been proven.   Side Effects and adverse reactions: Common side effects: Drowsiness (sedation). Dizziness. Nausea and vomiting. Constipation. Physical dependence -- Dependence often manifests with withdrawal symptoms when opioids are discontinued or decreased. Tolerance -- As you take repeated doses of opioids, you require increased medication to experience the same effect of pain relief. Respiratory depression -- This can occur in healthy people, especially with higher doses. However, people with COPD, asthma or other lung conditions may be even more susceptible to fatal respiratory impairment.  Uncommon side effects: An increased sensitivity to feeling pain and extreme response to pain (hyperalgesia). Chronic use of opioids can lead to this. Delayed gastric emptying (the process by which the contents of your stomach are moved into your small intestine). Muscle rigidity. Immune system and hormonal dysfunction. Quick, involuntary muscle jerks (myoclonus). Arrhythmia. Itchy skin (pruritus). Dry mouth (xerostomia).  Long-term side effects: Chronic constipation. Sleep-disordered breathing (SDB). Increased risk of bone fractures. Hypothalamic-pituitary-adrenal dysregulation. Increased risk of overdose.  RISKS: Respiratory depression and death: Opioids increase the risk of respiratory depression and death.  Drug-to-drug interactions: Opioids are relatively contraindicated in combination with benzodiazepines, sleep inducers, and other central nervous system depressants. Other classes of medications (i.e.: certain antibiotics  and even over-the-counter medications) may also trigger or induce respiratory depression in some patients.  Medical conditions: Patients with pre-existing respiratory problems are at higher risk of respiratory failure and/or depression when in combination with opioid analgesics. Opioids are relatively contraindicated in some medical conditions such as central sleep apnea.   Fractures and Falls:  Opioids increase the risk and incidence of falls. This is of particular importance in elderly patients.  Endocrine System:  Long-term administration is associated with endocrine abnormalities (endocrinopathies). (Also known as Opioid-induced Endocrinopathy) Influences on both the hypothalamic-pituitary-adrenal axis?and the hypothalamic-pituitary-gonadal axis have been demonstrated with consequent hypogonadism and adrenal insufficiency in both sexes. Hypogonadism and decreased levels of dehydroepiandrosterone sulfate have been reported in men and women. Endocrine effects include: Amenorrhoea in women (abnormal absence of menstruation) Reduced libido in both sexes Decreased sexual function Erectile dysfunction in men Hypogonadisms (decreased testicular function with shrinkage of testicles) Infertility Depression and fatigue Loss of muscle mass Anxiety Depression Immune suppression Hyperalgesia Weight gain Anemia Osteoporosis Patients (particularly women of childbearing age) should avoid opioids. There is insufficient evidence to recommend routine monitoring of asymptomatic patients taking opioids in the long-term for hormonal deficiencies.  Immune System: Human studies have demonstrated that opioids have an immunomodulating effect. These effects are mediated via opioid receptors both on immune effector cells and in the central nervous system. Opioids have been demonstrated to have adverse effects on antimicrobial response and anti-tumour surveillance. Buprenorphine  has been demonstrated to have  no impact on immune function.  Opioid Induced Hyperalgesia: Human studies have demonstrated that prolonged use of opioids can lead to a state of abnormal pain sensitivity, sometimes called opioid induced hyperalgesia (OIH). Opioid induced hyperalgesia is not usually seen in the absence of tolerance to opioid analgesia. Clinically, hyperalgesia may be diagnosed if the patient on long-term opioid therapy presents with increased pain. This might be qualitatively and anatomically distinct from pain related to disease progression or to breakthrough pain resulting from development of opioid tolerance. Pain associated with hyperalgesia tends to be more diffuse than the pre-existing pain and less defined in quality. Management of opioid induced hyperalgesia requires opioid dose reduction.  Cancer: Chronic opioid therapy has been associated with an increased risk  of cancer among noncancer patients with chronic pain. This association was more evident in chronic strong opioid users. Chronic opioid consumption causes significant pathological changes in the small intestine and colon. Epidemiological studies have found that there is a link between opium  dependence and initiation of gastrointestinal cancers. Cancer is the second leading cause of death after cardiovascular disease. Chronic use of opioids can cause multiple conditions such as GERD, immunosuppression and renal damage as well as carcinogenic effects, which are associated with the incidence of cancers.   Mortality: Long-term opioid use has been associated with increased mortality among patients with chronic non-cancer pain (CNCP).  Prescription of long-acting opioids for chronic noncancer pain was associated with a significantly increased risk of all-cause mortality, including deaths from causes other than overdose.  Reference: Von Korff M, Kolodny A, Deyo RA, Chou R. Long-term opioid therapy reconsidered. Ann Intern Med. 2011 Sep 6;155(5):325-8. doi:  10.7326/[id number]. PMID: 78106373; PMCID: [id number]. Kit JINNY Laurence CINDERELLA Pearley JINNY, Hayward RA, Dunn KM, [place] KP. Risk of adverse events in patients prescribed long-term opioids: A cohort study in the [employer]. Eur J Pain. 2019 May;23(5):908-922. doi: 10.1002/ejp.1357. Epub 2019 Jan 31. PMID: 69379883. Colameco S, Coren JS, Ciervo CA. Continuous opioid treatment for chronic noncancer pain: a time for moderation in prescribing. Postgrad Med. 2009 Jul;121(4):61-6. doi: 10.3810/pgm.2009.07.2032. PMID: 80358728. Gigi JONELLE Shlomo MILUS Levern IVER Conny RN, Arvin SD, Blazina I, Lonell DASEN, Bougatsos C, Deyo RA. The effectiveness and risks of long-term opioid therapy for chronic pain: a systematic review for a [employer] of [employer] Pathways to [employer]. Ann Intern Med. 2015 Feb 17;162(4):276-86. doi: 10.7326/M14-2559. PMID: 74418742. Rory CHRISTELLA Laurence [facility], Makuc DM. NCHS Data Brief No. 22. [place]: Centers for Disease Control and Prevention; 2009. Sep, Increase in Fatal Poisonings Involving Opioid Analgesics in the [place] , 1999-2006. Song IA, Choi HR, Oh TK. Long-term opioid use and mortality in patients with chronic non-cancer pain: Ten-year follow-up study in [place] from 2010 through 2019. EClinicalMedicine. 2022 Jul 18;51:101558. doi: 10.1016/j.eclinm.2022.898441. PMID: 64124182; PMCID: [id number]. Huser, W., Schubert, T., Vogelmann, T. et al. All-cause mortality in patients with long-term opioid therapy compared with non-opioid analgesics for chronic non-cancer pain: a database study. BMC Med 18, 162 (2020). [email or web address].info/ Rashidian H, Zendehdel K, Kamangar F, Malekzadeh R, Haghdoost AA. An Ecological Study of the Association between Opiate Use and Incidence of Cancers. Addict Health. 2016 Fall;8(4):252-260. PMID: 71180443; PMCID: [id number].  Our Goal: Our goal is to control your pain with means other  than the use of opioid pain medications.  Our Recommendation: Talk to your physician about coming off of these medications. We can assist you with the tapering down and stopping these medicines. Based on the new information, even if you cannot completely stop the medication, a decrease in the dose may be associated with a lesser risk. Ask for other means of controlling the pain. Decrease or eliminate those factors that significantly contribute to your pain such as smoking, obesity, and a diet heavily tilted towards inflammatory nutrients.  Last Updated: 08/07/2022   ______________________________________________________________________       ______________________________________________________________________    Update on Controlled Substance (Opioid) Regulations   To: All patients taking opioid pain medications. (I.e.: hydrocodone , hydromorphone , oxycodone , oxymorphone, morphine , codeine , methadone, tapentadol, tramadol, buprenorphine , fentanyl , etc.)  Re: Review on the state of controlled substance regulations.  Introduction: Rules and regulations associated with all aspects of controlled substances are constantly being modified. Unfortunately we have encountered patients questioning the veracity of the information  that we provide them about these changes. This is intended to provide them with appropriate references and a historical review of these changes.  A Brief History: As of November 15, 2015, the [place]  Government declared the opioid epidemic a public health emergency. Prescription drug monitoring programs (PDMPs) and the [facility] All Schedules Prescription Electronic Reporting Act (NASPER). Before 1800, clinicians regarded pain as an existential phenomenon, a consequence of aging. There was no regulation on the use of cocaine and opioids, resulting in widespread marketing and prescribing for many ailments ranging from diarrhea to toothache. The [employer] of  [phone], passed in response to the sudden emergence of street heroin abuse as well as iatrogenic morphine  dependence, influenced both physician and patient alike to avoid opiates. Patients with unexplained pain in the 1920s were regarded as deluded, malingering, or abusers, and cancer patients through the 1950s were encouraged to wean themselves off opioids until their lives "could be measured in weeks". Alongside this opioid evolution, the American Pain Society launched their influential "pain as the fifth vital sign" campaign in 1995. Concurrently, pharmaceutical companies introduced new formulations, such as extended release oxycodone  (OxyContin ). From 1997 to 2002, OxyContin  prescriptions increased from 670,000 to 6.2 million. However, concerns soon began to surface regarding overzealous opioid treatment. It must be noted that pharmaceutical companies contributed significantly to the rise of the opioid epidemic, receiving considerable reprimands as a consequence. In 2007, as the opioid epidemic began to inflict profound damage, [employer] pleaded guilty to federal charges related to the misbranding of OxyContin . [employer] agreed to pay a total of $634.5 million to resolve Justice Department investigations, as well as a $19.5 million settlement to [address] and the [address] of [place].  In response to the current epidemic, changes in focus to the development of new abuse deterrent opioid formulations at the [place]  Food and Drug Administration (FDA) as well as drafting of new public standards for pain treatment were created at [facility] in 2017. In response to the opioid epidemic, FDA public policy changes were announced in February 2016. Among these new positions were a re-examination of the risk-benefit paradigm for opioids with strict emphasis on the large public health ramifications. The various modified opioids released over the past 20 years, such as tamper-resistant preparation, have had differing levels of success,  and are collectively referred to as Risk Evaluation and Mitigation Strategies (REMS). There is also a growing focus on preventing opioid use disorder (OUD) and on offering affected individuals accessible and effective treatment. [place]  government policy reflects these changes and both the Affordable Care Act and the Mental Health Parity and Addiction Equity Act were major steps forward in treating opioid addiction. The Affordable Care Act, which was signed into law in 2010, with major provisions coming into effect by 2014.  In the 1990s, the intensified marketing of newly reformulated prescription opioid medications (e.g., OxyContin ) and an influential pain advocacy campaign that encouraged greater pain management led to a precipitous rise in opioid use in the [place] . Research from the Centers for Disease Control and Prevention (CDC) shows that prescription opioid sales in the [place]  quadrupled from 1999 to 2010. At the same time, opioid misuse and opioid-involved overdose deaths increased (Figure 1). Between 1999 and 2010, the rate of opioidinvolved overdose deaths in the [place]  doubled from 2.9 to 6.8 deaths per 100,000 people. This initial rise in opioid-related deaths is often referred to as the first wave of the recent opioid crisis.  Between 1999 and 2020, 565,000  Americans died of opioid-involved overdoses. In turn, federal, state, and local governments responded with various legal and policy efforts to curb opioid misuse and drug-related overdose Deaths.  Recent Congresses have enacted several laws addressing the opioid crisis, such as the Comprehensive Addiction and Recovery Act of 2016 (CARA, P.L. ([phone]); the 21st Century Cures Act (P.L. 114-255); the Substance UseDisorder Prevention that Promotes Opioid Recovery and Treatment for Patients and Communities Act (SUPPORT Act, P.L. ([phone]); the Fentanyl  Sanctions Act (Title LXXII of P.L. [id number]); and the Blocking  Deadly Fentanyl  Imports Act (P.L. 117-81, 6610). These laws addressed overprescribing and misuse of opioids, expanded substance use disorder prevention and treatment capacities, bolstered drug diversion capabilities, and enhanced international drug interdiction, counternarcotics cooperation, and sanctions efforts. Congress also directed additional funds to many of these initiatives through appropriations.  Congress provided funding in the [employer] Act of 2021 [phone]; P.L. 117-2) for syringe services programs (often known as needle exchange programs) and other harm reduction initiatives. Federal and state harm reduction strategies have frequently involved the distribution of naloxone  (e.g., Narcan )--a medication used to reverse an opioid overdose--and test strips used to detect fentanyl  in drug samples.  The Department of Justice (DOJ) and Department of Homeland Security [facility]) aim to reduce the diversion of prescription opioids and the use, manufacturing, and trafficking of illicit opioids. DOJ--via the Drug Enforcement Administration (DEA)--regulates opioid manufacturers, distributors, and dispensers; it also controls the opioid supply through enforcement of regulatory requirements.  A History of Opiate Laws in the [place]   Prior to 1890, laws concerning opiates were strictly imposed on a local city or state-by-state basis. One of the first was in [place] in 1875 where it became illegal to smoke opium  only in opium  dens. It did not ban the sale, import or use otherwise. In the next 25 years different states enacted opium  laws ranging from outlawing opium  dens altogether to making possession of opium , morphine  and heroin without a physician's prescription illegal.  The first Congressional Act took place in 1890 that levied taxes on morphine  and opium . From that time on the [employer] has had a series of laws and acts directly aimed at opiate use, abuse  and control. These are outlined below:  1906 - Pure Food and Drug Act Preventing the manufacture, sale, or transportation of adulterated or misbranded or poisonous or deleterious foods, drugs, medicines, and liquors, and for regulating traffic therein, and for other purposes. Punishment included fines and prison time.  1909   - Smoking Opium  Exclusion Act Banned the importation, possession and use of smoking opium . Did not regulate opium -based medications. First Freight forwarder banning the non-medical use of a substance.  1914  - The Margrette Act In summary, The Margrette Act of 1914 was written more to have all parties involved in importing, exporting, Set designer and distributing opium  or cocaine to register with the [employer] and have taxes levied upon them. Exempt from the law were physicians operating "in the course of his professional practice"  1919 - Supreme Court ratified the [employer] in [place] et [place]., v. [place]  and [place]  v. [place], then again in [facility] v. [place] , in 1920, holding that doctors may not prescribe maintenance supplies of narcotics to people addicted to narcotics. However, it does not prohibit doctors from prescribing narcotics to wean a patient off of the drug. It was also the opinion of the court that prescribing narcotics to habitual users was not considered "professional practice" hence it  then was considered illegal for doctors to prescribe opioids for the purposes of maintaining an addiction. It can be argued that today's addiction medications are not intended to maintain an addiction but to facilitate addiction remission. In which case, this opinion of the court should not preclude practitioners from prescribing buprenorphine  or methadone to patients suffering from an addictive disorder.  1924  - Heroin Act Architectural technologist, importation and possession of heroin illegal - even for medicinal use.  1922 -- Narcotic  Drug Import and Export Act Enacted to assure proper control of importation, sale, possession, production and consumption of narcotics.  1927  -- Special educational needs teacher of Pro[employer] of Prohibition was responsible for tracking bootleggers and organized Conservation officer, historic buildings. They focused primarily on interstate and international cases and those cases where local law enforcement official would not or could not act.  1932 -- Uniform State Narcotic Act Encouraged states to pass uniform state laws matching the federal Narcotic Drug Import and Export Act. Suggested prohibiting cannabis use at the state level.  58 -- Food, Drug, and Cosmetic Act The new law brought cosmetics and medical devices under control, and it required that drugs be labeled with adequate directions for safe use. Moreover, it mandated pre-market approval of all new drugs, such that a manufacturer would have to prove to FDA that a drug were safe before it could be sold  1951 -- Boggs Act Imposed maximum criminal penalties for violations of the import/export and internal revenue laws related to drugs and also established mandatory minimum prison sentences.  1956 -- Narcotics Control Act Increased Boggs Act penalties and mandatory prison sentence minimums for violations of existing drug laws.  1965 -- Drug Abuse Control Amendment Enacted to deal with problems caused by abuse of depressants, stimulants and hallucinogens. Restricted research into psychoactive drugs such as LSD by requiring FDA approval.  1970 -- Controlled Substance Act  Controlled Substances Import and Export Act These laws are a consolidation of numerous laws regulating the manufacture and distribution of narcotics, stimulants, depressants, hallucinogens, anabolic steroids, and chemicals used in the illicit production of controlled substances. The CSA places all substances that are regulated under existing federal law into one of five schedules. This placement is based upon  the substance's medicinal value, harmfulness, and potential for abuse or addiction. Schedule I is reserved for the most dangerous drugs that have no recognized medical use, while Schedule V is the classification used for the least dangerous drugs. The act also provides a mechanism for substances to be controlled, added to a schedule, decontrolled, removed from control, rescheduled, or transferred from one schedule to another.  37 - Drug Enforcement Agency By Executive Order, the DEA was formed to take place of the [employer] of Narcotics and Dangerous Drugs.  39 -Narcotic Addict Treatment Act of  1974  - Public Law [phone] Amends the Controlled Substance Act of 1970 to provide for the registration of practitioners conducting narcotic treatment programs. [methadone clinics] It also provides legal definitions for the phrases "maintenance treatment" and "detoxification treatment".  1986 -- Anti-Drug Abuse Act of 1986 Strengthened Federal efforts to encourage foreign cooperation in eradicating illicit drug crops and in halting international drug traffic, to improve enforcement of Federal drug laws and enhance interdiction of illicit drug shipments, to provide strong Market researcher in establishing effective drug abuse prevention and education programs, to [employer] support for drug abuse treatment and rehabilitation efforts, and for other purposes. It also re-imposed mandatory sentencing minimums depending on which drug and how  much was involved.  1988 -- Anti-Drug Abuse Act of 1988 Established the Office of Materials engineer (ONDCP) in the [employer] of the Economist; authorized funds for [employer], state and local drug enforcement activities, school-based drug prevention efforts, and drug abuse treatment with special emphasis on injecting drug abusers at high risk for AIDS.  2000 -- Federal - The Drug Addiction Treatment Act of 2000 (DATA 2000) It enables qualified physicians to  prescribe and/or dispense narcotics for the purpose of treating opioid dependency. For the first time, physicians are able to treat this disease from their private offices or other clinical settings. This presents a very desirable treatment option for those who are unwilling or unable to seek help in drug treatment clinics. Patients can now be treated in the privacy of their doctor's office, as are other people being treated for any other type of medical condition. One medicine doctors may now prescribe is Buprenorphine . The major downfall of this Act is the limitation of 30 patients per practice - which means that large facilities, no matter how many physicians are there, can only treat 30 patients at a time.  2002-- DEA reschedules buprenorphine  from a schedule V drug to a schedule III drug, on November 05, 2000 - the day before the FDA approval of Suboxone and Subutex  despite overwhelming objection by the medical community.  2004: June 2004 THE CONFIDENTIALITY OF ALCOHOL  AND DRUG ABUSE PATIENT RECORDS REGULATION AND THE HIPAA PRIVACY RULE:  Confidentiality of Alcohol  and Drug Dependence Patient Records (summary) Code of Federal Regulations Title 42 Part 2 (42 CFR Part 2)  The confidentiality of alcohol  and drug dependence patient records maintained by this practice/program is protected by federal law and regulations. Generally, the practice/program may not say to a person outside the practice/program that a patient attends the practice/program, or disclose any information identifying a patient as being alcohol  or drug dependent unless:  The patient consents in writing; The disclosure is allowed by a court order, or The disclosure is made to medical personnel in a medical emergency or to qualified personnel for research,  audit, or practice/program evaluation. Violation of the federal law and regulations by a practice/program is a crime. Suspected violations may be reported to appropriate authorities  in accordance with federal regulations. Freight forwarder and regulations do not protect any information about a crime committed by a patient either at the practice/program or against any person who works for the practice/program or about any threat to commit such a crime. Federal laws and regulations do not protect any information about suspected child abuse or neglect from being reported under state law to appropriate state or local authorities.  sample consent form (MS-WORD)  2005: 09-01-2003 Public law [phone], Amends the Controlled Substances Act to eliminate the 30-patient limit for medical group practices allowed to dispense narcotic drugs in schedules III, IV, or V for maintenance or detoxification treatment (retains the 30-patient limit for an individual physician). This amendment removes the 30-patient limit on group medical practices that treat opioid dependence with buprenorphine . The restriction was part of the original Drug Addiction Treatment Act of 2000 (DATA) that allowed treatment of opioid dependence in a doctor's office. With this change, every certified doctor may now prescribe buprenorphine  up to his or her individual physician limit of 30 patients.  2006: On 01/27/2005 President Levy signed Bill H.R.6344 into law. This allows physicians who have been certified to prescribe certain drugs for the treatment of opioid dependence under DATA2000 to treat up to  100 patients (up from 30) by submitting an intent notification to the Dept of Health and [employer]. This is a major step forward in both fighting the stigma and allowing access to treatment previously not available to some. For more details see 30/100-PATIENT LIMIT  2016: HHS augments regulations concerning the 30/100 patient limit by raising the limit to 275 for qualifying physicians. Link to summary of regulation  2016: Comprehensive Addiction and Recovery Act of 2016 (sec.303) amends the Controlled Substance Act - to allow Nurse  Practitioners and Physician Assistants to become eligible to prescribe buprenorphine  for the treatment of opioid use disorder. See the entire law for more details.  The roots of the concurrent regulation of certain drugs under two statutory schemes go back to the beginning of this century. In 1906, Congress enacted the Pure Food and Drug Act, establishing one regime of regulation to assure (among other things) that drugs were not adulterated or misbranded. These regulations were amended several times, recodified in 1938, and expanded on again from the 1940s through the 1990s. Their implementation and enforcement is today assigned to the Food and Drug Administration (FDA) in the Department of Health and Human Services [facility]).  In 1914, Congress adopted the [place] Narcotic Act to stop abuse of addictive drugs. The [place] Narcotic Act was amended in 1937 to include marijuana. In 1965, amphetamines, barbiturates, and hallucinogens came under regulation, but under the [employer], Drug, and Cosmetic Act. In 1970, these various statutes were consolidated and recodified as the Controlled Substances Act (CSA), which has been amended several times since then. Its implementation and enforcement is today assigned to the Drug Enforcement Administration (DEA) in the Department of Justice.  The first clash occurred after World War I, when so-called morphine  clinics existed and physicians prescribed or dispensed morphine  to addicts. Some addicts were veterans of the American Civil War, the Spanish-American War, and WWI, who had become addicted during treatment for war wounds, but most of them came from the growing population of nonmedical addicts (Courtwright, 8017). The Narcotics Division of the Prohibition Unit of the Department of the Treasury, which was then responsible for enforcing the [facility] Narcotic Act, concluded that this activity was not the legitimate practice of medicine but simple drug trafficking. The  Treasury Department swiftly closed the clinics and made it personally and professionally risky for physicians to maintain a narcotic addict for any reason. In did so, however, only after the American Medical Association had adopted a resolution, in 1920, opposing ambulatory clinics''.  In 1972, the public health establishment, including the Secretary of Health, Education, and Welfare, the Education officer, environmental, the [employer] of [employer], and the Chemical engineer for Drug Abuse Prevention, was unprepared to allow [employer] of Narcotics and Dangerous Drugs, DEA's predecessor agency, to unilaterally define the parameters of medical practice for the use of methadone in the treatment of heroin addiction. As a consequence, a new set of rules--the third, on top of the FDA and DEA schemes--was added, one that inserted FDA deeply into the practice of medicine, notwithstanding its protestations to the contrary. Congress ratified this joint responsibility of law enforcement and public health officials for methadone through this third set of rules in 1974 with the passage of the Narcotic Addict Treatment Act (NATA). To examine in detail the evolution of this third set of rules--commonly referred to as the FDA or DHHS methadone regulations--we turn, first, to the period of the mid-1960s.  Increased use of heroin in the  post World War II period first became apparent in the early to mid 1950s. During the [employer], a minimum mandatory narcotics law was enacted in 1956, effective July 1957. 1962 [facility] conference on drug abuse, the [employer] on Narcotic and Drug Abuse (the [employer]) of 1963, the Drug Abuse Control Amendments of 1965, the President's Commission on [employer] and Administration of Justice (the [employer]) of [phone], and the Narcotic Addiction Rehabilitation Act of 1966.  The 1965 Drug Abuse Control Amendments  brought under strict federal control all nonnarcotic drugs capable of producing serious psychotoxic effects when abused. This act also created the [employer] of Drug Abuse Control within the Department of Health, Education, and Welfare (DHEW) and shifted the basis for [employer] of illegal drugs from tax principles (administered by the Department of Treasury) to the regulation of commerce (administered by the [employer]).  The 1966 Narcotic Addiction Rehabilitation Act Tour manager) authorized the civil commitment of narcotic addicts, and federal assistance to state and local governments to develop a local system of drug treatment programs. With respect to the latter, the [employer] of Mental Health [facility]) initially proposed the gradual implementation of the state assistance effort, mainly through a common mental health mechanism--inpatient treatment programs. However, because of a perceived pressing need, the courts began to commit addicts to these programs even before they were officially opened or staffed. The NARA legislation imposed the following contract requirements on treatment centers: (1) thrice-a-week counseling sessions; (2) weekly urine tests; (3) restorative dental services; (4) psychological consultations and vocational training; and (5) the treatment modalities of drug-free outpatient, therapeutic community, and methadone maintenance. Reorganization Plan No. 1 of 1968 transferred the primary functions of the [employer] of Narcotics (FBN) from the [employer] to the Department of Justice; it also transferred the [employer] of Drug Abuse Control functions to the Department of Justice. Within the [employer], the [employer] of Narcotics and Dangerous Drugs (BNDD) was created, which became the Drug Enforcement Administration in 1973.   Under the first [place] administration [phone]), federal drug abuse policy developed in a significant way. These developments included a 1969 war  on drugs presidential message, resulting legislation in 1970, and a Special Action Office created by executive order in 1971 and authorized in statute in 1972. Brynn, in 1969, to send a message to Congress on drug abuse. Although this was the first time that a U.S. president invoked the war on drugs image, it was in retrospect the most balanced approach to the problem of drug abuse that had been advanced. The 1969 message resulted in the submission of legislation to the Congress and the passage, the following year, of the Comprehensive Drug Abuse Prevention and Control Act of 1970 [employer] [phone], November 25, 1968). The act dealt with research, treatment, and prevention of drug abuse and drug dependence, and with drug abuse Charity fundraiser. One major purpose of the 1970 legislation was to reverse some of the strictures of the [employer] of 1914. The 1970 act sought to clarify for the medical profession . . . the extent to which they may safely go in treating narcotic addicts as patients. Title I, in Section IV, charged the Surveyor, minerals, Education, and Welfare, to determine the appropriate methods of professional practice in the medical treatment of the narcotic addiction of various classes of narcotic addicts. This provision constitutes the initial statutory basis for treatment standards. The law enforcement sections consolidated all prior federal statutes into the  Controlled Substances Act and the Controlled Substances Export and Import Act (Titles II and III, respectively, of the Comprehensive Drug Abuse Prevention and Control Act of 1970). Under this legislation, substances were classified under five schedules according to their abuse potential, and psychological and physical effects. Methadone was placed in Schedule II, along with such opiate drugs as morphine , codeine , and hydrocodone .  One of the most important steps taken by President Brynn was to establish in June 1971 the  Special Action Office for Drug Abuse Prevention (SAODAP) in the [employer] of the President ([place] [phone], July 16, 1969). In mid-1971, [facility] appointed Dr. Maple Dunnings as SAODAP director. Within a year, the Drug Abuse Prevention Office and Treatment Act of 1972 [employer] [phone], April 20, 1970) gave statutory authority to [facility], but limiting setting, on July 29, 1973, as the limit on its existence.  The purpose of the 1972 act was to bring the resources of the federal government to bear on drug abuse with the immediate objective of significantly reducing its incidence and developing a comprehensive, coordinated long-term federal strategy to combat drug abuse.  Narcotic Addict Treatment Act [employer]) of 1974 [employer] [phone]), which amended the Controlled Substances Act. This legislation was driven by concern for the diversion of methadone to illicit channels that was occurring in 1972 and 1973, as reflected in the title of the Senate bill adopted on July 08, 1971, the Methadone Diversion Control Act of 1973. (U.S. Senate, 1970a, 8029a).  The 1980 final rule (45 FR 37305, October 19, 1978) reduced the minimum standard for admission from two years of addiction to one year coupled with a clinical determination that the individual was currently physiologically.  The regulations were next revised in 1989, following two proposals to modify them, one in 1983 and one in 1987.  Under President Tanda Corrente, a government-wide effort was made to review all federal government regulations and to eliminate or reduce the burden of these regulations on the private sector, state and [employer], and [employer].   The 1983 recommendations, though not adopted, did initiate another revision of the methadone regulations, which first found expression in a 1987 proposed rule (52 FR 37047, October 31, 1985) and culminated in a final rule (54 FR 8954, April 01, 1987) at the end of the  decade. In the 1987 proposed rule, the FDA and NIDA, in an effort to put the best face on the unenthusiastic 1983 response by the provider community to converting the regulations to guidelines, indicated that they had retained the current requirements necessary to achieve the goals of the 1974 NATA, but were proposing to streamline the regulation and to promote more efficient operation of methadone programs. The 1987 proposed rule, issued by the FDA and NIDA, advanced the following changes in the methadone regulation: that detoxification treatment be divided into short-term (<21 days) and long-term (>21 and <180 days) treatment; that the minimum staffing ratio of one counselor to 50 patients be eliminated; that blood tests be allowed as ways to conduct initial drug screening or to meet the monthly testing requirements for six-day take-home patients; that the 72-hour notification of FDA and the pertinent state authority for methadone doses greater than 100 mg be eliminated; that special adverse reaction reporting requirements for methadone be eliminated and reliance placed upon general FDA reporting requirements; that a supervising counselor be allowed to conduct the annual review of the patient's treatment plan for certain qualified patients who had been in treatment for 3 years  or longer; and that the requirement of an annual report of methadone treatment programs to the FDA be dropped. The FDA and NIDA issued a final rule on April 01, 1987, based on comments on the 1987 proposed (54 FR 8954). Concurrently, FDA and NIDA issued a six-page guidance document, which noted that the regulations, over time, had recommended certain practices that were not actually required. Public Health Service, in Congress, and elsewhere, to reorganize the Alcohol , Drug Abuse, and Mental Health Administration (ADAMHA). These efforts culminated in the [employer] of 1992 [employer] [phone], August 09, 1990), the main  purpose of which was to transfer the research portions of the three ADAMHA institutes--NIDA, the [employer] Mills of Alcoholism and Alcohol  Abuse, and the [employer] Mills of Mental Health--to the [employer] and to create the Substance Abuse and Mental Health Services Administration [facility]) as the home for the service functions of these entitles.  Guidelines for Opioid Treatment The Federal Guidelines for Opioid Treatment Programs - 2015 serve as a guide to accrediting organizations for developing accreditation standards. The guidelines also provide OTPs with information on how programs can achieve and maintain compliance with federal regulations. The 2015 guidelines are an update to the 2007 Guidelines for the Accreditation of Opioid Treatment Programs (PDF  547 KB). The new document reflects the obligation of OTPs to deliver care consistent with the patient-centered, integrated, and recovery-oriented standards of substance use treatment.  DPT oversees the certification of OTPs and provides guidance to nonprofit organizations and state governmental entities that want to become a SAMHSA-approved accrediting body. Learn more about the accreditation and certification of OTPs and [employer] oversight of OTP accreditation bodies.  Model Guidelines for [facility] Boards With input from [facility], the Federation of [employer] in 2013 adopted a revised version of the federation's office-based opioid treatment policies. The Model Policy on DATA 2000 and Treatment of Opioid Addiction in the Medical Office - 2013 (PDF  279 KB) provides model guidelines for use by state medical boards in regulating office-based opioid treatment.  Holiday Guidance for Opioid Treatment Programs (PDF  203 KB) In response to requests for the upcoming federal holidays and ensuing weekends (December 24th, 25th, and 26th and December 31st, Jan 1st, and Jan 2nd), this letter is to provide guidance  regarding requests for unsupervised doses of medication for patients for these dates. View a sample [id number] (PDF  194 KB).  Federal regulation of drugs emerged as early as 24, under a law that addressed only imported drugs. In 1905 the [employer] launched a private, voluntary means of controlling a substantial part of the drug marketplace, a system that remained in place for over a half-century. Drug regulation in FDA has evolved considerably since President Ricardo Para signed the 1906 Pure Food and Drugs Act.  1820 Eleven doctors set up the U.S. Pharmacopeia and record the first list of standard drugs. 1848 Drug Importation Act passed by Congress requires U.S. Customs Service inspection to stop entry of tainted, low quality drugs from overseas. 8116 Dr. Mitchell MICAEL Burrs becomes the chief chemist at the [facility] of [place] adulteration studies.  1905 The American Medical Association [facility]) begins a voluntary program of drug approval that would last until 1955. In order to advertise in the [facility] and related journals, drug companies must show proof that the drug will treat what they claim. 1906 The original Food and Drug Act is passed by Congress on June 30 and signed by [employer].  The Act outlaws states from buying and selling food, drinks, and drugs that have been mislabeled and tainted. 1911 In U.S. v. Vicci, the [employer] that the [employer] and Drugs Act does not outlaw false medical claims but only false and misleading statements about the ingredients or identity of a drug. 1912 Congress passes the [place] Amendment to overcome the ruling in U.S. v. Vicci. The Act outlaws labeling medicines with fake medical claims that is meant to trick the buyer. 1930 The name of the Food, Drug, and Insecticide Administration is shortened to Food and Drug Administration (FDA) under an Therapist, music. 1933 FDA  recommends a total rewrite of the out-of-date 1906 Food and Drugs Act.   1937 Elixir Sulfanilamide, contain the poisonous liquid, diethylene glycol, kills 107 persons, many of whom are children, dramatizing the need to establish drug safety before marketing and to pass the pending food and drug law. 1938 Congress passes [employer], Drug, and Cosmetic (FDC) Act of 1938, which requires that new drugs show safety before selling. This starts a new system of drug regulation. The Act also requires that safe limits be set for unavoidable poisonous matter and allows for factory inspections. The [employer] is given power to oversee advertising for all FDAregulated products except prescription drugs. FDA states that sulfanilamide and other dangerous drugs must be given under the direction of a medical expert. This begins the requirement for prescription only (nonnarcotic) drugs (see 1951 Poynette-Humphrey amendment). 1941 Nearly 300 deaths and injuries result from the use of sulfathiazole tablets, an antibiotic, tainted with the sedative, phenobarbital. In response, FDA drastically changes manufacturing and quality controls. These changes lead to the development of good manufacturing practices (GMPs). 1948 The [employer] in U.S. v. Floretta that FDA jurisdiction extends to retail stores, thereby allowing FDA to stop illegal sales of drugs by pharmacies including barbiturates and amphetamines. 1950 In Walgreen. v. U.S., a U.S. Court of Appeals rules that the directions for use on a drug label must include the drug's purpose. 1951 Congress passes the [place]-Humphrey Amendment, which defines the kinds of drugs that cannot be used safely without medical supervision. The amendment limits sale of these drugs to prescription only by a medical professional. All other drugs are to be available without a prescription. 1952 A nationwide investigation by FDA  reveals that chloramphenicol, an antibiotic, caused nearly 180 cases of often deadly blood diseases. Two years later FDA engages the [employer] of Hospital Pharmacists, the American Association of Medical Record Librarians, and later the American Medical Association in a voluntary program of drug reaction reporting. 1953 The [employer] Amendment clarifies previous law and requires FDA to give manufacturers written reports of conditions seen during inspections and results of factory samples. 1962 Thalidomide, a new sleeping pill, causes severe birth defects of the arms and legs in thousands of babies born in [place]. The U.S. media reports on how Dr. Cathlean Mort, a FDA medical officer, helped prevent approval and marketing of Thalidomide in the [place] . These reports stirred up public support for stronger drug laws. 3 Congress passes the [employer]. For the first time, these laws require drug makers to prove their drug works before FDA can approve them for sale. The Advisory Committee on Investigational Drugs meets for the first time. This was the first meeting of a committee to advise FDA on product approval and policy on an ongoing basis. 1966 FDA contracts with the [place]  of [employer] to measure the effectiveness of 4,000 marketed drugs approved on the basis of safety alone between [phone] and Dec 04, 1960. The Fair Packaging and Labeling Act requires all consumer products, in interstate commerce, to be honestly and informatively labeled. 12/05/66 FDA forms the Drug Efficacy Study Implementation (DESI) to carry out recommendations of the [employer] of the effectiveness of drugs first sold between [place] and 11/05/1962November 05, 1970 FDA requires the first patient package insert, medicines must come with information for the patient about risks and benefits. 1972 Over-the-Counter Drug Review begins  to enhance the safety, effectiveness and appropriate labeling of drugs sold without prescription. 1973 The U.S. Supreme Court upholds the [place] drug effectiveness law and approves FDA's action to control entire classes of products. 1982 FDA issues Tamper-resistant Packaging Regulations to prevent poisonings such as deaths from cyanide placed in Tylenol  capsules. Congress passes the [employer] in 1981-12-04, making it a crime to tamper with packaged consumer products. 1982/12/05 Drug Price Advertising account planner Act (Hatch-Waxman Act) increases the availability of less costly generic drugs by allowing FDA to approve applications for generic versions of brand-name drugs without repeating the research that proved the safety and effectiveness of the brand-name drugs. The Act also allowed brand-name companies to apply for up to five years additional patent protection for the new medicines they developed to make up for time lost while their products were going through FDA's approval process. 1989 The FDA issued guidelines asking drug makers to decide if a drug is likely to have usefulness in elderly people and to include elderly people in studies when applicable. 1991 In 12-05-79, the FDA and the Department of Health and Human Services published a policy on protecting people in research. In 04-Dec-1989, this policy is adopted by more than a dozen federal agencies involved in human subject research and becomes known as the Common Rule. 4 1993 FDA launches MedWatch, a system designed to collect reports from health professionals on problems with drugs and other medical products. FDA issues guidelines for measuring gender differences in responses to medication. Drug companies are encouraged to include patients of both sexes in their research of drugs and to study any gender-specific effects. 1995 FDA declares cigarettes to be drug delivery devices. Limits are issued on marketing and  sales to reduce smoking by young people. 1998 FDA introduces the Adverse Event Reporting System (AERS), a computerized database designed to store and study safety reports on already marketed drugs.  The Demographic Rule requires that a marketing application review data on safety and effectiveness by age, gender, and race. The Pediatric Rule requires drug makers of selected new and existing drugs to conduct studies on drug safety and effectiveness in children. 1999 Creation of the Drug Facts Label for OTC drug products. The law requires all overthe-counter drug labels to have information in a standard format. These drug facts labels are designed to give the user easy-to-find information. 2000 The U. [place]. [employer], upholds an earlier decision from [employer] and Drug Administration v. [employer]. et al. and rules 5-4 that FDA does not have authority to regulate tobacco as a drug. Dec 04, 2000 The Best Pharmaceuticals for Children Act, in exchange for studying the drug in children, the drug maker gets six months of selling their product without competition. 2001-12-04 The Pediatric Research Equity Act gives FDA the right to ask drug companies to study the effectiveness of new drugs in children. Dec 05, 2002 FDA advises medical professionals to  limit the use of a pain reliever called Cox-2, a nonsteroidal anti-inflammatory drug (NSAIDs). Studies had shown that long-term use raised chances of heart attacks and strokes. The warning is also added to the over-thecounter NSAIDs' Drug Facts label. Medicines used in hospitals must have a bar code to prevent patients from receiving the wrong medicine. 5 2005 The Drug Safety Board is formed, consisting of FDA staff and representatives from the [employer] of [address] and the [employer]. The Board advises the Director, Center for Drug Evaluation and Research, FDA, on drug safety issues and works with the agency in sharing safety  information to health professionals and patients.  The [place]  Food and Drug Administration (FDA) was first created to enforce the Pure Food and Drug Act of 1906. In this capacity, the FDA is charged with protecting the health of the [place]  public, to ensure the quality of its food, medicine, and cosmetics. Before this time, the [place]  government had no formal oversight of these products and left issues of quality and purity to the individual manufactures, or at times, individual states.    Review: [place] Stop ACT. (The Strengthen Opioid Misuse Prevention (STOP) Act of 2017). GENERAL ASSEMBLY OF [place]  SESSION 2017 SESSION LAW 2017-74 HOUSE BILL 243  PMP mandatory The dispenser shall report: (1) The dispenser's DEA number. (2) The name of the patient for whom the controlled substance is being dispensed, and the patient's: a. Full address, including city, state, and zip code, b. Telephone number, and c. Date of birth. (3) The date the prescription was written. (4) The date the prescription was filled. (5) The prescription number. (6) Whether the prescription is new or a refill. (7) Metric quantity of the dispensed drug. (8) Estimated days of supply of dispensed drug, if provided to the dispenser. (9) National Drug Code of dispensed drug. (10) Prescriber's DEA number. (11) Method of payment for the prescription.  No paper prescriptions  Duration of scripts Acute vs Chronic prescribing  2016 CDC Guidelines for prescribing Opioids for Chronic Pain. (Updated in 2022.) Medical Board  Laws:  Prescription Laws Drug laws, rules, and regulations are constantly changing. Any attempt to summarize them would quickly become outdated. Because of that, the Board encourages practitioners who seek guidance on prescribing procedures to refer to the sources listed below in addition to the Board's position statements, rules and Medical Practice Act.  [place]  Board of Pharmacy  (NCBOP) (which offers the state's pharmacy laws and rules, and links to the Code of Federal Regulations) [employer] Site: www.ncbop.org  [place]  General Statutes General Web Site: PoliticalPool.cz See: [place]  Food, Drug, and Cosmetic Act: S293155 & 106-134 See: [place]  Pharmacy Practice Act, Article 4A: [phone] See: [place]  Controlled Substances Act, Article 5: 90-86 & 90-113.8 See: Use of controlled substances to render one mentally incapacitated or physically helpless: [facility] [facility]. Code, Title 21, Food & Drugs www.deadiversion.usdoj.gov Controlled Substances Schedules www.deadiversion.usdoj.gov Drug Warehouse manager - www.deadiversion.usdoj.gov 42 CFR  8.12 - Federal opioid treatment standards.   Effective September 26, 2015, prior approval will be required for opioid analgesic doses for [facility]. Medicaid and [place] [facility]) beneficiaries which:  Exceed 120 mg of morphine  equivalents (MME) per day  Are greater than a 14-day supply of any opioid, or,  Are non-preferred opioid products on the [place] Medicaid Preferred Drug List (PDL)  FEDERAL 42 CFR  8.12 - Federal opioid treatment standards. Title II of the Comprehensive Drug Abuse  Prevention and Control Act of 1970, commonly known as the Controlled Substance Act (CSA) Title 21 [place]  Code ([place]) Controlled Substances Act.   Reference:   ______________________________________________________________________       ______________________________________________________________________    Medication Rules  Purpose: To inform patients, and their family members, of our medication rules and regulations.  Applies to: All patients receiving prescriptions from our practice (written or electronic).  Pharmacy of record: This is the pharmacy where your electronic prescriptions will be sent. Make sure we have the correct one.  Electronic  prescriptions: In compliance with the [place]  Strengthen Opioid Misuse Prevention (STOP) Act of 2017 (Session Law 2017-74/H243), effective January 30, 2018, all controlled substances must be electronically prescribed. Written prescriptions, faxing, or calling prescriptions to a pharmacy will no longer be done.  Prescription refills: These will be provided only during in-person appointments. No medications will be renewed without a face-to-face evaluation with your provider. Applies to all prescriptions.  NOTE: The following applies primarily to controlled substances (Opioid* Pain Medications).   Type of encounter (visit): For patients receiving controlled substances, face-to-face visits are required. (Not an option and not up to the patient.)  Patient's Responsibilities: Pain Pills: Bring all pain pills to every appointment (except for procedure appointments). Pill counts are required.  Pill Bottles: Bring pills in original pharmacy bottle. Bring bottle, even if empty. Always bring the bottle of the most recent fill.  Medication refills: You are responsible for knowing and keeping track of what medications you are taking and when is it that you will need a refill. The day before your appointment: write a list of all prescriptions that need to be refilled. The day of the appointment: give the list to the admitting nurse. Prescriptions will be written only during appointments. No prescriptions will be written on procedure days. If you forget a medication: it will not be Called in, Faxed, or electronically sent. You will need to get another appointment to get these prescribed. No early refills. Do not call asking to have your prescription filled early. Partial  or short prescriptions: Occasionally your pharmacy may not have enough pills to fill your prescription.  NEVER ACCEPT a partial fill or a prescription that is short of the total amount of pills that you were prescribed.  With  controlled substances the law allows 72 hours for the pharmacy to complete the prescription.  If the prescription is not completed within 72 hours, the pharmacist will require a new prescription to be written. This means that you will be short on your medicine and we WILL NOT send another prescription to complete your original prescription.  Instead, request the pharmacy to send a carrier to a nearby branch to get enough medication to provide you with your full prescription. Prescription Accuracy: You are responsible for carefully inspecting your prescriptions before leaving our office. Have the discharge nurse carefully go over each prescription with you, before taking them home. Make sure that your name is accurately spelled, that your address is correct. Check the name and dose of your medication to make sure it is accurate. Check the number of pills, and the written instructions to make sure they are clear and accurate. Make sure that you are given enough medication to last until your next medication refill appointment. Taking Medication: Take medication as prescribed. When it comes to controlled substances, taking less pills or less frequently than prescribed is permitted and encouraged. Never take more pills than instructed. Never take the medication more frequently than  prescribed.  Inform other Doctors: Always inform, all of your healthcare providers, of all the medications you take. Pain Medication from other Providers: You are not allowed to accept any additional pain medication from any other Doctor or Healthcare provider. There are two exceptions to this rule. (see below) In the event that you require additional pain medication, you are responsible for notifying us , as stated below. Cough Medicine: Often these contain an opioid, such as codeine  or hydrocodone . Never accept or take cough medicine containing these opioids if you are already taking an opioid* medication. The combination may cause  respiratory failure and death. Medication Agreement: You are responsible for carefully reading and following our Medication Agreement. This must be signed before receiving any prescriptions from our practice. Safely store a copy of your signed Agreement. Violations to the Agreement will result in no further prescriptions. (Additional copies of our Medication Agreement are available upon request.) Laws, Rules, & Regulations: All patients are expected to follow all [address] and [employer], Rules, [employer]. Ignorance of the Laws does not constitute a valid excuse.  Illegal drugs and Controlled Substances: The use of illegal substances (including, but not limited to marijuana and its derivatives) and/or the illegal use of any controlled substances is strictly prohibited. Violation of this rule may result in the immediate and permanent discontinuation of any and all prescriptions being written by our practice. The use of any illegal substances is prohibited. Adopted CDC guidelines & recommendations: Target dosing levels will be at or below 60 MME/day. Use of benzodiazepines** is not recommended. Urine Drug testing: Patients taking controlled substances will be required to provide a urine sample upon request. Do not void before coming to your medication management appointments. Hold emptying your bladder until you are admitted. The admitting nurse will inform you if a sample is required. Our practice reserves the right to call you at any time to provide a sample. Once receiving the call, you have 24 hours to comply with request. Not providing a sample upon request may result in termination of medication therapy.  Exceptions: There are only two exceptions to the rule of not receiving pain medications from other Healthcare Providers. Exception #1 (Emergencies): In the event of an emergency (i.e.: accident requiring emergency care), you are allowed to receive additional pain medication. However, you are  responsible for: As soon as you are able, call our office [phone], at any time of the day or night, and leave a message stating your name, the date and nature of the emergency, and the name and dose of the medication prescribed. In the event that your call is answered by a member of our staff, make sure to document and save the date, time, and the name of the person that took your information.  Exception #2 (Planned Surgery): In the event that you are scheduled by another doctor or dentist to have any type of surgery or procedure, you are allowed (for a period no longer than 30 days), to receive additional pain medication, for the acute post-op pain. However, in this case, you are responsible for picking up a copy of our Post-op Pain Management for Surgeons handout, and giving it to your surgeon or dentist. This document is available at our office, and does not require an appointment to obtain it. Simply go to our office during business hours (Monday-Thursday from 8:00 AM to 4:00 PM) (Friday 8:00 AM to 12:00 Noon) or if you have a scheduled appointment with us , prior to your surgery,  and ask for it by name. In addition, you are responsible for: calling our office [phone], at any time of the day or night, and leaving a message stating your name, name of your surgeon, type of surgery, and date of procedure or surgery. Failure to comply with your responsibilities may result in termination of therapy involving the controlled substances.  Consequences:  Non-compliance with the above rules may result in permanent discontinuation of medication prescription therapy. All patients receiving any type of controlled substance is expected to comply with the above patient responsibilities. Not doing so may result in permanent discontinuation of medication prescription therapy. Medication Agreement Violation. Following the above rules, including your responsibilities will help you in avoiding a Medication  Agreement Violation ("Breaking your Pain Medication Contract").  *Opioid medications include: morphine , codeine , oxycodone , oxymorphone, hydrocodone , hydromorphone , meperidine, tramadol, tapentadol, buprenorphine , fentanyl , methadone. **Benzodiazepine medications include: diazepam  (Valium ), alprazolam (Xanax), clonazepam  (Klonopine), lorazepam  (Ativan ), clorazepate (Tranxene), chlordiazepoxide (Librium), estazolam (Prosom), oxazepam (Serax), temazepam (Restoril), triazolam (Halcion) (Last updated: 11/22/2022) ______________________________________________________________________     ______________________________________________________________________    Medication Recommendations and Reminders  Applies to: All patients receiving prescriptions (written and/or electronic).  Medication Rules & Regulations: You are responsible for reading, knowing, and following our Medication Rules document. These exist for your safety and that of others. They are not flexible and neither are we. Dismissing or ignoring them is an act of non-compliance that may result in complete and irreversible termination of such medication therapy. For safety reasons, non-compliance will not be tolerated. As with the [place] fundamental legal principle of ignorance of the law is no defense, we will accept no excuses for not having read and knowing the content of documents provided to you by our practice.  Pharmacy of record:  Definition: This is the pharmacy where your electronic prescriptions will be sent.  We do not endorse any particular pharmacy. It is up to you and your insurance to decide what pharmacy to use.  We do not restrict you in your choice of pharmacy. However, once we write for your prescriptions, we will NOT be re-sending more prescriptions to fix restricted supply problems created by your pharmacy, or your insurance.  The pharmacy listed in the electronic medical record should be the one where you want  electronic prescriptions to be sent. If you choose to change pharmacy, simply notify our nursing staff. Changes will be made only during your regular appointments and not over the phone.  Recommendations: Keep all of your pain medications in a safe place, under lock and key, even if you live alone. We will NOT replace lost, stolen, or damaged medication. We do not accept Police Reports as proof of medications having been stolen. After you fill your prescription, take 1 week's worth of pills and put them away in a safe place. You should keep a separate, properly labeled bottle for this purpose. The remainder should be kept in the original bottle. Use this as your primary supply, until it runs out. Once it's gone, then you know that you have 1 week's worth of medicine, and it is time to come in for a prescription refill. If you do this correctly, it is unlikely that you will ever run out of medicine. To make sure that the above recommendation works, it is very important that you make sure your medication refill appointments are scheduled at least 1 week before you run out of medicine. To do this in an effective manner, make sure that you do not leave the office  without scheduling your next medication management appointment. Always ask the nursing staff to show you in your prescription , when your medication will be running out. Then arrange for the receptionist to get you a return appointment, at least 7 days before you run out of medicine. Do not wait until you have 1 or 2 pills left, to come in. This is very poor planning and does not take into consideration that we may need to cancel appointments due to bad weather, sickness, or emergencies affecting our staff. DO NOT ACCEPT A Partial Fill: If for any reason your pharmacy does not have enough pills/tablets to completely fill or refill your prescription, do not allow for a partial fill. The law allows the pharmacy to complete that prescription within 72  hours, without requiring a new prescription. If they do not fill the rest of your prescription within those 72 hours, you will need a separate prescription to fill the remaining amount, which we will NOT provide. If the reason for the partial fill is your insurance, you will need to talk to the pharmacist about payment alternatives for the remaining tablets, but again, DO NOT ACCEPT A PARTIAL FILL, unless you can trust your pharmacist to obtain the remainder of the pills within 72 hours.  Prescription refills and/or changes in medication(s):  Prescription refills, and/or changes in dose or medication, will be conducted only during scheduled medication management appointments. (Applies to both, written and electronic prescriptions.) No refills on procedure days. No medication will be changed or started on procedure days. No changes, adjustments, and/or refills will be conducted on a procedure day. Doing so will interfere with the diagnostic portion of the procedure. No phone refills. No medications will be called into the pharmacy. No Fax refills. No weekend refills. No Holliday refills. No after hours refills.  Remember:  Business hours are:  Monday to Thursday 8:00 AM to 4:00 PM Provider's Schedule: Eric Como, MD - Appointments are:  Medication management: Monday and Wednesday 8:00 AM to 4:00 PM Procedure day: Tuesday and Thursday 7:30 AM to 4:00 PM Wallie Sherry, MD - Appointments are:  Medication management: Tuesday and Thursday 8:00 AM to 4:00 PM Procedure day: Monday and Wednesday 7:30 AM to 4:00 PM (Last update: 11/22/2021) ______________________________________________________________________

## 2024-01-03 NOTE — Progress Notes (Signed)
 Nursing Pain Medication Assessment:  Safety precautions to be maintained throughout the outpatient stay will include: orient to surroundings, keep bed in low position, maintain call bell within reach at all times, provide assistance with transfer out of bed and ambulation.  Medication Inspection Compliance: Pill count conducted under aseptic conditions, in front of the patient. Neither the pills nor the bottle was removed from the patient's sight at any time. Once count was completed pills were immediately returned to the patient in their original bottle.  Medication: Hydrocodone /APAP Pill/Patch Count: 45 of 90 pills/patches remain Pill/Patch Appearance: Markings consistent with prescribed medication Bottle Appearance: Standard pharmacy container. Clearly labeled. Filled Date: 31 / 19 / 2025 Last Medication intake:  Today

## 2024-01-03 NOTE — Progress Notes (Signed)
 PROVIDER NOTE: Interpretation of information contained herein should be left to medically-trained personnel. Specific patient instructions are provided elsewhere under Patient Instructions section of medical record. This document was created in part using AI and STT-dictation technology, any transcriptional errors that may result from this process are unintentional.  Patient: Tracy Mann  Service: E/M   PCP: Bertrum Charlie CROME, MD  DOB: [birth year]  DOS: 01/03/2024  Provider: Emmy MARLA Blanch, NP  MRN: [id number]  Delivery: Face-to-face  Specialty: Interventional Pain Management  Type: Established Patient  Setting: Ambulatory outpatient facility  Specialty designation: 09  Referring Prov.: Bertrum Charlie CROME, MD  Location: Outpatient office facility       History of present illness (HPI) [name], a 89 y.o. year old female, is here today because of her leg pain. [name] primary complain today is Leg Pain  Pertinent problems: [name] has DDD (degenerative disc disease), lumbosacral; Obstructive sleep apnea; Chronic atrial fibrillation (HCC); Chronic venous insufficiency; Chronic pain syndrome; Disorder of skeletal system; Lumbar facet hypertrophy (Multilevel) (Bilateral); Lumbar central spinal stenosis (SEVERE) (L3-4) with neurogenic claudication; Grade 1 Anterolisthesis of lumbar spine (L4/L5); Lumbar facet arthropathy (L3-4 right-sided facet edema/joint effusion); Lumbar lateral recess stenosis (L4-5) (Right); Chronic low back pain (3ry area of Pain) (Bilateral) w/ sciatica (Bilateral); Lumbosacral radiculopathy at L5 (Bilateral); Chronic lower extremity pain (1ry area of Pain) (Bilateral); Pharmacologic therapy; Lumbosacral facet syndrome; Chronic hip pain (2ry area of Pain) (Bilateral) (R>L); Osteoarthritis involving multiple joints; Osteoarthritis of knee (Right); Osteoarthritis of facet joint of lumbar spine; and Osteoarthritis of lumbar spine on their pertinent problem list.  Pain  Assessment: Severity of Chronic pain is reported as a 7 /10. Location: Leg Right, Left/Denies. Onset: More than a month ago. Quality: Aching. Timing: Constant. Modifying factor(s): Pain medication. Vitals:  height is 5' 3.5 (1.613 m) and weight is 187 lb (84.8 kg). Her temporal temperature is 96.8 F (36 C) (abnormal). Her blood pressure is 127/53 (abnormal) and her pulse is 86. Her respiration is 18 and oxygen saturation is 100%.  BMI: Estimated body mass index is 32.61 kg/m as calculated from the following:   Height as of this encounter: 5' 3.5 (1.613 m).   Weight as of this encounter: 187 lb (84.8 kg).  Last encounter: 10/11/2023. Last procedure: Visit date not found.  Reason for encounter: medication management. The patient indicates doing well with current medication regimen. No side effects or adverse reaction reported medication.   Discussed the use of AI scribe software for clinical note transcription with the patient, who gave verbal consent to proceed.  History of Present Illness   Tracy Mann is an 89 year old female with congestive heart failure who presents for pain management due to leg pain and swelling.  She experiences persistent leg pain primarily below the knee and in the feet, with occasional pain in the toes. The pain worsens with swelling around the ankles, which is more pronounced in one leg. Wearing shoes is uncomfortable due to the swelling, and elevating her legs at night sometimes causes pain.  She reports that her doctors think the swelling is due to a fluid problem, and she had a kidney problem in the past related to a medication. Her kidney doctor discontinued the offending medication, leading to a decrease in swelling. She has a history of cellulitis in the fall and summer. She has been diagnosed with congestive heart failure, and she believes this could play a part in her fluid retention and swelling.  She is currently taking torsemide , a diuretic, at a dose  of 10 mg each morning, and recalls her cardiologist told her to increase the dose if swelling persists. She previously took Lasix  but switched due to bladder irritation. She is on a fluid restriction of two tall glasses of water per day due to her heart condition, which she finds challenging given her medication regimen. She is concerned about potential dehydration from the diuretics.     Pharmacotherapy Assessment   Analgesic: Hydrocodone -acetaminophen  (Norco) 10-325 mg tablet every 8 hours as needed for pain. MME=20 Monitoring: [place] PMP: PDMP reviewed during this encounter.       Pharmacotherapy: No side-effects or adverse reactions reported. Compliance: No problems identified. Effectiveness: Clinically acceptable.  Tracy Mann, [place]  01/03/2024  2:49 PM  Sign when Signing Visit Nursing Pain Medication Assessment:  Safety precautions to be maintained throughout the outpatient stay will include: orient to surroundings, keep bed in low position, maintain call bell within reach at all times, provide assistance with transfer out of bed and ambulation.  Medication Inspection Compliance: Pill count conducted under aseptic conditions, in front of the patient. Neither the pills nor the bottle was removed from the patient's sight at any time. Once count was completed pills were immediately returned to the patient in their original bottle.  Medication: Hydrocodone /APAP Pill/Patch Count: 90 of 90 pills/patches remain Pill/Patch Appearance: Markings consistent with prescribed medication Bottle Appearance: Standard pharmacy container. Clearly labeled. Filled Date: 32 / 23 / 2025 Last Medication intake:  Today  Tracy Mann, CMA  01/03/2024  2:26 PM  Sign when Signing Visit Nursing Pain Medication Assessment:  Safety precautions to be maintained throughout the outpatient stay will include: orient to surroundings, keep bed in low position, maintain call bell within reach at all times, provide assistance  with transfer out of bed and ambulation.  Medication Inspection Compliance: Pill count conducted under aseptic conditions, in front of the patient. Neither the pills nor the bottle was removed from the patient's sight at any time. Once count was completed pills were immediately returned to the patient in their original bottle.  Medication: Hydrocodone /APAP Pill/Patch Count: 45 of 90 pills/patches remain Pill/Patch Appearance: Markings consistent with prescribed medication Bottle Appearance: Standard pharmacy container. Clearly labeled. Filled Date: 46 / 19 / 2025 Last Medication intake:  Today    UDS:  Summary  Date Value Ref Range Status  07/18/2023 FINAL  Final    Comment:    ==================================================================== [employer] [facility] (MW) ==================================================================== Test                             Result       Flag       Units  Drug Present and Declared for Prescription Verification   Lorazepam                       1849         EXPECTED   ng/mg creat    Source of lorazepam  is a scheduled prescription medication.    Hydrocodone                     1725         EXPECTED   ng/mg creat   Hydromorphone                   113          EXPECTED  ng/mg creat   Dihydrocodeine                 156          EXPECTED   ng/mg creat   Norhydrocodone                 1804         EXPECTED   ng/mg creat    Sources of hydrocodone  include scheduled prescription medications.    Hydromorphone , dihydrocodeine and norhydrocodone are expected    metabolites of hydrocodone . Hydromorphone  and dihydrocodeine are    also available as scheduled prescription medications.  ==================================================================== Test                      Result    Flag   Units      Ref Range   Creatinine              102              mg/dL      >=79 ==================================================================== Declared  Medications:  The flagging and interpretation on this report are based on the  following declared medications.  Unexpected results may arise from  inaccuracies in the declared medications.   **Note: The testing scope of this panel includes these medications:   Hydrocodone  (Norco)  Lorazepam  (Ativan )   **Note: The testing scope of this panel does not include the  following reported medications:   Acetaminophen  (Norco)  Albuterol  (Ventolin  HFA)  Apixaban  (Eliquis )  Estradiol  (Estrace )  Ezetimibe  (Zetia )  Flavoxate (Urispas)  Fluticasone  (Flonase )  Hydrocortisone   Insulin  (Lantus )  Levothyroxine  (Synthroid )  Lisinopril  (Zestril )  Magnesium   Mupirocin  (Bactroban )  Naloxone  (Narcan )  Nitroglycerin  (Nitrostat )  Ondansetron  (Zofran )  Pantoprazole  (Protonix )  Sucralfate  (Carafate )  Torsemide  (Demadex ) ==================================================================== For clinical consultation, please call [phone]. ====================================================================     No results found for: CBDTHCR No results found for: D8THCCBX No results found for: D9THCCBX  ROS  Constitutional: Denies any fever or chills Gastrointestinal: No reported hemesis, hematochezia, vomiting, or acute GI distress Musculoskeletal: Bilateral leg pain Neurological: No reported episodes of acute onset apraxia, aphasia, dysarthria, agnosia, amnesia, paralysis, loss of coordination, or loss of consciousness  Medication Review  Cholecalciferol, HYDROcodone -acetaminophen , Insulin  Pen Needle, LORazepam , Magnesium , albuterol , apixaban , ezetimibe , flavoxATE, fluticasone , hydrocortisone  cream, insulin  glargine, insulin  lispro, levothyroxine , lisinopril , nitroGLYCERIN , ondansetron , torsemide , triamcinolone  cream, and trimethoprim   History Review  Allergy: Ms. Kuznia is allergic to clindamycin , clindamycin /lincomycin, duloxetine , paroxetine , sulfa antibiotics, cephalexin ,  clarithromycin, diphenhydramine, estrogens conjugated, estrogens conjugated, and nitrofurantoin . Drug: Ms. Ikeda  reports no history of drug use. Alcohol:  reports no history of alcohol use. Tobacco:  reports that she has never smoked. She has never used smokeless tobacco. Social: Ms. Mandile  reports that she has never smoked. She has never used smokeless tobacco. She reports that she does not drink alcohol and does not use drugs. Medical:  has a past medical history of Acute myocardial infarction of inferior wall (HCC), Arthritis, Chronic anticoagulation, Chronic atrial fibrillation (HCC), Chronic combined systolic and diastolic CHF (congestive heart failure) (HCC)estimated right ventricular  systolic pressure is 56.0 mmHg.   3. Left atrial size severely dilated, Right atrial size severely dilated. Moderate to severe MR, mild TR, Chronic low back pain with bilateral sciatica, Essential hypertension, History of use of hearing aid in both ears, Hyperlipidemia, mixed, Mixed Ischemic/Nonischemic Cardiomyopathy, Moderate to severe mitral regurgitation, Nonobstructive coronary artery disease, Obesity, Spinal stenosis, lumbar, Stasis dermatitis of both legs, Thyroid  disease, Type  2 diabetes mellitus with stage 3b chronic kidney disease, with long-term current use of insulin  (HCC), Type II diabetes mellitus (HCC), Venous insufficiency, Vitamin D deficiency, and Wears dentures. Surgical: Ms. Proano  has a past surgical history that includes Vesicovaginal fistula closure w/ TAH (1996); Tubal ligation (1968); Cholecystectomy (1999); Cardiac catheterization (11/2012); Cataract extraction w/PHACO (Right, 09/24/2023); and Cataract extraction w/PHACO (Left, 10/08/2023). Family: family history includes Heart attack in her mother; Heart disease in her brother, brother, and mother; Thyroid  disease in her father.  Laboratory Chemistry Profile   Renal Lab Results  Component Value Date   BUN 41 (H) 01/01/2024   CREATININE 1.48  (H) 01/01/2024   LABCREA 87 07/21/2023   BCR 28 01/01/2024   GFRAA 73 12/31/2019   GFRNONAA 23 (L) 07/22/2023    Hepatic Lab Results  Component Value Date   AST 21 07/21/2023   ALT 12 07/21/2023   ALBUMIN 3.1 (L) 07/21/2023   ALKPHOS 57 07/21/2023   LIPASE 32 07/20/2023    Electrolytes Lab Results  Component Value Date   NA 141 01/01/2024   K 5.4 (H) 01/01/2024   CL 100 01/01/2024   CALCIUM  9.7 01/01/2024   MG 2.3 07/22/2023   PHOS 3.6 07/22/2023    Bone Lab Results  Component Value Date   25OHVITD1 43 11/26/2019   25OHVITD2 9.2 11/26/2019   25OHVITD3 34 11/26/2019    Inflammation (CRP: Acute Phase) (ESR: Chronic Phase) Lab Results  Component Value Date   CRP 4 11/26/2019   ESRSEDRATE 45 (H) 11/26/2019   LATICACIDVEN 1.2 07/20/2023         Note: Above Lab results reviewed.  Recent Imaging Review  US  Venous Img Lower Bilateral (DVT) CLINICAL DATA:  Bilateral leg swelling, pain and redness for 1-2 months.  EXAM: BILATERAL LOWER EXTREMITY VENOUS DOPPLER ULTRASOUND  TECHNIQUE: Gray-scale sonography with graded compression, as well as color Doppler and duplex ultrasound were performed to evaluate the lower extremity deep venous systems from the level of the common femoral vein and including the common femoral, femoral, profunda femoral, popliteal and calf veins including the posterior tibial, peroneal and gastrocnemius veins when visible. The superficial great saphenous vein was also interrogated. Spectral Doppler was utilized to evaluate flow at rest and with distal augmentation maneuvers in the common femoral, femoral and popliteal veins.  COMPARISON:  None Available.  FINDINGS: RIGHT LOWER EXTREMITY  Common Femoral Vein: No evidence of thrombus. Normal compressibility, respiratory phasicity and response to augmentation.  Saphenofemoral Junction: No evidence of thrombus. Normal compressibility and flow on color Doppler imaging.  Profunda Femoral  Vein: No evidence of thrombus. Normal compressibility and flow on color Doppler imaging.  Femoral Vein: No evidence of thrombus. Normal compressibility, respiratory phasicity and response to augmentation.  Popliteal Vein: No evidence of thrombus. Normal compressibility, respiratory phasicity and response to augmentation.  Calf Veins: No evidence of thrombus. Normal compressibility and flow on color Doppler imaging.  Superficial Great Saphenous Vein: No evidence of thrombus. Normal compressibility.  Venous Reflux:  None.  Other Findings:  None.  LEFT LOWER EXTREMITY  Common Femoral Vein: No evidence of thrombus. Normal compressibility, respiratory phasicity and response to augmentation.  Saphenofemoral Junction: No evidence of thrombus. Normal compressibility and flow on color Doppler imaging.  Profunda Femoral Vein: No evidence of thrombus. Normal compressibility and flow on color Doppler imaging.  Femoral Vein: The LEFT femoral vein is limited in visualization.  Popliteal Vein: No evidence of thrombus. Normal compressibility, respiratory phasicity and response to augmentation.  Calf  Veins: No evidence of thrombus. Normal compressibility and flow on color Doppler imaging.  Superficial Great Saphenous Vein: No evidence of thrombus. Normal compressibility.  Venous Reflux:  None.  Other Findings:  None.  IMPRESSION: Limited visualization of the LEFT femoral vein without evidence of deep venous thrombosis in either lower extremity.  Electronically Signed   By: Suzen Dials M.D.   On: 07/20/2023 19:25 DG Tibia/Fibula Right CLINICAL DATA:  Leg swelling and cellulitis. Evaluate for soft tissue gas.  EXAM: RIGHT TIBIA AND FIBULA - 2 VIEW  COMPARISON:  None Available.  FINDINGS: There is no evidence of fracture or other focal bone lesions. There is diffuse soft tissue swelling of the lower extremity. No definite soft tissue gas identified. Peripheral  vascular calcifications are present.  IMPRESSION: Diffuse soft tissue swelling of the lower extremity. No definite soft tissue gas identified.  Electronically Signed   By: Greig Pique M.D.   On: 07/20/2023 18:46 Note: Reviewed        Physical Exam  Vitals: BP (!) 127/53 (BP Location: Right Arm, Patient Position: Sitting, Cuff Size: Normal)   Pulse 86   Temp (!) 96.8 F (36 C) (Temporal)   Resp 18   Ht 5' 3.5 (1.613 m)   Wt 187 lb (84.8 kg)   SpO2 100%   BMI 32.61 kg/m  BMI: Estimated body mass index is 32.61 kg/m as calculated from the following:   Height as of this encounter: 5' 3.5 (1.613 m).   Weight as of this encounter: 187 lb (84.8 kg). Ideal: Ideal body weight: 53.5 kg (118 lb 0.9 oz) Adjusted ideal body weight: 66.1 kg (145 lb 10.1 oz) General appearance: Well nourished, well developed, and well hydrated. In no apparent acute distress Mental status: Alert, oriented x 3 (person, place, & time)       Respiratory: No evidence of acute respiratory distress Eyes: PERLA  Musculoskeletal: Bilateral leg pain Assessment   Diagnosis Status  1. Chronic lower extremity pain (1ry area of Pain) (Bilateral)   2. Chronic use of opiate for therapeutic purpose   3. Chronic pain syndrome   4. Pharmacologic therapy   5. Chronic hip pain (2ry area of Pain) (Bilateral) (R>L)   6. Chronic low back pain (3ry area of Pain) (Bilateral) w/ sciatica (Bilateral)   7. Lumbosacral facet syndrome   8. Chronic upper back pain   9. Chronic knee pain (Bilateral)    Controlled Controlled Controlled   Updated Problems: No problems updated.  Plan of Care  Problem-specific:  Assessment and Plan    Chronic bilateral lower extremity pain Pain below knees and feet with ankle swelling due to fluid retention, possibly linked to heart failure and kidney issues. Arthritis may contribute. Current diuretic therapy with torsemide . - Elevate legs to heart level using two to three pillows. -  Continue torsemide  for fluid management. - Hydrate with two tall glasses of water daily as advised by cardiologist. - Use ice or candy for dry mouth relief.  Chronic opioid therapy for pain management : Patient's pain is controlled with hydrocodone , will continue on current medication regimen.  Prescribing drug monitoring (PDMP) reviewed, findings consistent with the use of prescribed medication and no evidence of narcotic misuse or abuse.  Urine drug screening (UDS) up to date.  No side effect or adverse reaction reported to medication.  Schedule follow-up in 90 days for medication management.    Ms. YAMIRA PAPA has a current medication list which includes the following long-term medication(s): albuterol ,  apixaban , ezetimibe , fluticasone , insulin  lispro, levothyroxine , lisinopril , nitroglycerin , torsemide , [START ON 01/18/2024] hydrocodone -acetaminophen , [START ON 02/17/2024] hydrocodone -acetaminophen , and [START ON 03/18/2024] hydrocodone -acetaminophen .  Pharmacotherapy (Medications Ordered): Meds ordered this encounter  Medications   HYDROcodone -acetaminophen  (NORCO) 10-325 MG tablet    Sig: Take 1 tablet by mouth every 8 (eight) hours as needed for severe pain (pain score 7-10). Must last 30 days.    Dispense:  90 tablet    Refill:  0    DO NOT: delete (not duplicate); no partial-fill (will deny script to complete), no refill request (F/U required). DISPENSE: 1 day early if closed on fill date. WARN: No CNS-depressants within 8 hrs of med.   HYDROcodone -acetaminophen  (NORCO) 10-325 MG tablet    Sig: Take 1 tablet by mouth every 8 (eight) hours as needed for severe pain (pain score 7-10). Must last 30 days.    Dispense:  90 tablet    Refill:  0    DO NOT: delete (not duplicate); no partial-fill (will deny script to complete), no refill request (F/U required). DISPENSE: 1 day early if closed on fill date. WARN: No CNS-depressants within 8 hrs of med.   HYDROcodone -acetaminophen  (NORCO)  10-325 MG tablet    Sig: Take 1 tablet by mouth every 8 (eight) hours as needed for severe pain (pain score 7-10). Must last 30 days.    Dispense:  90 tablet    Refill:  0    DO NOT: delete (not duplicate); no partial-fill (will deny script to complete), no refill request (F/U required). DISPENSE: 1 day early if closed on fill date. WARN: No CNS-depressants within 8 hrs of med.   Orders:  No orders of the defined types were placed in this encounter.       Return in about 3 months (around 04/02/2024) for (F2F), (MM), Emmy Blanch NP.    Recent Visits Date Type Provider Dept  10/11/23 Office Visit Pj Zehner K, NP [facility]  Showing recent visits within past 90 days and meeting all other requirements Today's Visits Date Type Provider Dept  01/03/24 Office Visit Yetzali Weld K, NP [facility]  Showing today's visits and meeting all other requirements Future Appointments Date Type Provider Dept  04/02/24 Appointment Diara Chaudhari K, NP [facility]  Showing future appointments within next 90 days and meeting all other requirements  I discussed the assessment and treatment plan with the patient. The patient was provided an opportunity to ask questions and all were answered. The patient agreed with the plan and demonstrated an understanding of the instructions.  Patient advised to call back or seek an in-person evaluation if the symptoms or condition worsens.  I personally spent a total of 30 minutes in the care of the patient today including preparing to see the patient, getting/reviewing separately obtained history, performing a medically appropriate exam/evaluation, counseling and educating, placing orders, referring and communicating with other health care professionals, documenting clinical information in the EHR, independently interpreting results, communicating results, and coordinating care.   Note by: Rosendo Couser K Betzaira Mentel, NP (TTS and AI technology used. I  apologize for any typographical errors that were not detected and corrected.) Date: 01/03/2024; Time: 3:16 PM

## 2024-01-10 ENCOUNTER — Ambulatory Visit: Admitting: Physician Assistant

## 2024-02-27 ENCOUNTER — Telehealth: Payer: Self-pay | Admitting: Cardiovascular Disease

## 2024-02-27 MED ORDER — APIXABAN 2.5 MG PO TABS: 2.5000 mg | ORAL_TABLET | Freq: Two times a day (BID) | ORAL | 1 refills | Status: AC

## 2024-02-27 MED ORDER — APIXABAN 2.5 MG PO TABS: 2.5000 mg | ORAL_TABLET | Freq: Two times a day (BID) | ORAL | 3 refills | Status: DC

## 2024-02-27 NOTE — Telephone Encounter (Signed)
" °*  STAT* If patient is at the pharmacy, call can be transferred to refill team.   1. Which medications need to be refilled? (please list name of each medication and dose if known)  Eliquis  2.5 MG twice daily  2. Which pharmacy/location (including street and city if local pharmacy) is medication to be sent to? [id number] - [place] [place] - [address]    3. Do they need a 30 day or 90 day supply?   90 day supply "

## 2024-02-27 NOTE — Telephone Encounter (Signed)
 Prescription refill request for Eliquis  received. Indication: AFIB Last office visit: 01/01/24 Scr:  1.62 (02/19/24) Age: 89 Weight: 84.8KG   Eliquis  2.5 bid refill sent.

## 2024-02-27 NOTE — Telephone Encounter (Signed)
 Called patient to gather more information on palpitations - patient is concerned as palp have increased in frequency - offered APP slot - prefers to see you but you only have NP slots through March - please advise as to scheduling

## 2024-02-27 NOTE — Telephone Encounter (Signed)
 Patient c/o Palpitations: STAT if patient c/o lightheadedness, shortness of breath, or chest pain  How long have you had palpitations/irregular HR/ Afib? Are you having the symptoms now?  Palpitations coming and going for the past month. No symptoms currently.  Are you currently experiencing lightheadedness, SOB or CP?  No   Do you have a history of afib (atrial fibrillation) or irregular heart rhythm?  Hx afib  Have you checked your BP or HR? (document readings if available):    Are you experiencing any other symptoms?  Exhaustion, occasional CP/indigestion

## 2024-02-29 NOTE — Telephone Encounter (Signed)
 Called patient and left message for call back.

## 2024-03-06 NOTE — Telephone Encounter (Signed)
 Called and spoke with patient. Patient reports recent blood pressure 130/60 and heart rate in 80's. Patient states that she doesn't have symptoms with palpitations but they have increased in frequency. Patient states that she would like to be seen. Patient scheduled to be seen in clinic 03/18/24.

## 2024-03-18 ENCOUNTER — Ambulatory Visit: Admitting: Cardiovascular Disease

## 2024-04-02 ENCOUNTER — Encounter: Admitting: Nurse Practitioner
